# Patient Record
Sex: Male | Born: 1935
Health system: Southern US, Community
[De-identification: ages and names within clinical notes are randomized; demographics above are authoritative.]

## PROBLEM LIST (undated history)

## (undated) DIAGNOSIS — N189 Chronic kidney disease, unspecified: Secondary | ICD-10-CM

## (undated) DIAGNOSIS — K219 Gastro-esophageal reflux disease without esophagitis: Secondary | ICD-10-CM

## (undated) DIAGNOSIS — R911 Solitary pulmonary nodule: Secondary | ICD-10-CM

## (undated) DIAGNOSIS — J449 Chronic obstructive pulmonary disease, unspecified: Secondary | ICD-10-CM

## (undated) DIAGNOSIS — C419 Malignant neoplasm of bone and articular cartilage, unspecified: Secondary | ICD-10-CM

## (undated) HISTORY — PX: BACK SURGERY: SHX140

## (undated) HISTORY — DX: Chronic kidney disease, unspecified: N18.9

---

## 2002-04-14 ENCOUNTER — Encounter: Payer: Self-pay | Admitting: Family Medicine

## 2002-04-14 ENCOUNTER — Ambulatory Visit (HOSPITAL_COMMUNITY): Admission: RE | Admit: 2002-04-14 | Discharge: 2002-04-14 | Payer: Self-pay | Admitting: Family Medicine

## 2002-04-17 ENCOUNTER — Ambulatory Visit (HOSPITAL_COMMUNITY): Admission: RE | Admit: 2002-04-17 | Discharge: 2002-04-17 | Payer: Self-pay | Admitting: Family Medicine

## 2002-04-17 ENCOUNTER — Encounter: Payer: Self-pay | Admitting: Family Medicine

## 2002-10-30 ENCOUNTER — Emergency Department (HOSPITAL_COMMUNITY): Admission: EM | Admit: 2002-10-30 | Discharge: 2002-10-30 | Payer: Self-pay | Admitting: *Deleted

## 2002-10-30 ENCOUNTER — Encounter: Payer: Self-pay | Admitting: *Deleted

## 2002-11-07 ENCOUNTER — Ambulatory Visit (HOSPITAL_COMMUNITY): Admission: RE | Admit: 2002-11-07 | Discharge: 2002-11-07 | Payer: Self-pay | Admitting: Family Medicine

## 2002-11-07 ENCOUNTER — Encounter: Payer: Self-pay | Admitting: Family Medicine

## 2002-12-14 ENCOUNTER — Ambulatory Visit (HOSPITAL_COMMUNITY): Admission: RE | Admit: 2002-12-14 | Discharge: 2002-12-14 | Payer: Self-pay | Admitting: Internal Medicine

## 2002-12-19 ENCOUNTER — Ambulatory Visit (HOSPITAL_COMMUNITY): Admission: RE | Admit: 2002-12-19 | Discharge: 2002-12-19 | Payer: Self-pay | Admitting: Family Medicine

## 2002-12-19 ENCOUNTER — Encounter: Payer: Self-pay | Admitting: Family Medicine

## 2002-12-27 ENCOUNTER — Ambulatory Visit (HOSPITAL_COMMUNITY): Admission: RE | Admit: 2002-12-27 | Discharge: 2002-12-27 | Payer: Self-pay | Admitting: Family Medicine

## 2002-12-27 ENCOUNTER — Encounter: Payer: Self-pay | Admitting: Family Medicine

## 2003-02-28 ENCOUNTER — Ambulatory Visit (HOSPITAL_COMMUNITY): Admission: RE | Admit: 2003-02-28 | Discharge: 2003-02-28 | Payer: Self-pay | Admitting: Family Medicine

## 2003-02-28 ENCOUNTER — Encounter: Payer: Self-pay | Admitting: Family Medicine

## 2003-04-03 ENCOUNTER — Ambulatory Visit (HOSPITAL_COMMUNITY): Admission: RE | Admit: 2003-04-03 | Discharge: 2003-04-03 | Payer: Self-pay | Admitting: Pulmonary Disease

## 2003-04-06 ENCOUNTER — Ambulatory Visit (HOSPITAL_COMMUNITY): Admission: RE | Admit: 2003-04-06 | Discharge: 2003-04-06 | Payer: Self-pay | Admitting: Pulmonary Disease

## 2004-10-31 ENCOUNTER — Ambulatory Visit (HOSPITAL_COMMUNITY): Admission: RE | Admit: 2004-10-31 | Discharge: 2004-10-31 | Payer: Self-pay | Admitting: Family Medicine

## 2004-12-02 ENCOUNTER — Ambulatory Visit: Payer: Self-pay | Admitting: Internal Medicine

## 2004-12-12 ENCOUNTER — Ambulatory Visit: Payer: Self-pay | Admitting: Internal Medicine

## 2005-03-03 ENCOUNTER — Inpatient Hospital Stay (HOSPITAL_COMMUNITY): Admission: EM | Admit: 2005-03-03 | Discharge: 2005-03-08 | Payer: Self-pay | Admitting: Emergency Medicine

## 2005-03-03 ENCOUNTER — Ambulatory Visit: Payer: Self-pay | Admitting: Internal Medicine

## 2005-05-27 ENCOUNTER — Ambulatory Visit (HOSPITAL_COMMUNITY): Admission: RE | Admit: 2005-05-27 | Discharge: 2005-05-27 | Payer: Self-pay | Admitting: Family Medicine

## 2005-06-29 ENCOUNTER — Ambulatory Visit (HOSPITAL_COMMUNITY): Admission: RE | Admit: 2005-06-29 | Discharge: 2005-06-29 | Payer: Self-pay | Admitting: Family Medicine

## 2005-07-05 ENCOUNTER — Emergency Department (HOSPITAL_COMMUNITY): Admission: EM | Admit: 2005-07-05 | Discharge: 2005-07-05 | Payer: Self-pay | Admitting: Emergency Medicine

## 2005-08-03 ENCOUNTER — Encounter: Admission: RE | Admit: 2005-08-03 | Discharge: 2005-08-11 | Payer: Self-pay | Admitting: Orthopaedic Surgery

## 2005-09-21 ENCOUNTER — Emergency Department (HOSPITAL_COMMUNITY): Admission: EM | Admit: 2005-09-21 | Discharge: 2005-09-21 | Payer: Self-pay | Admitting: Emergency Medicine

## 2005-11-18 ENCOUNTER — Observation Stay (HOSPITAL_COMMUNITY): Admission: EM | Admit: 2005-11-18 | Discharge: 2005-11-20 | Payer: Self-pay | Admitting: Emergency Medicine

## 2006-02-16 ENCOUNTER — Ambulatory Visit (HOSPITAL_COMMUNITY): Admission: RE | Admit: 2006-02-16 | Discharge: 2006-02-16 | Payer: Self-pay | Admitting: Family Medicine

## 2006-09-17 ENCOUNTER — Emergency Department (HOSPITAL_COMMUNITY): Admission: EM | Admit: 2006-09-17 | Discharge: 2006-09-17 | Payer: Self-pay | Admitting: Emergency Medicine

## 2006-10-19 ENCOUNTER — Ambulatory Visit (HOSPITAL_COMMUNITY): Admission: RE | Admit: 2006-10-19 | Discharge: 2006-10-19 | Payer: Self-pay | Admitting: Family Medicine

## 2007-12-03 ENCOUNTER — Emergency Department (HOSPITAL_COMMUNITY): Admission: EM | Admit: 2007-12-03 | Discharge: 2007-12-03 | Payer: Self-pay | Admitting: *Deleted

## 2008-02-21 ENCOUNTER — Ambulatory Visit (HOSPITAL_COMMUNITY): Admission: RE | Admit: 2008-02-21 | Discharge: 2008-02-21 | Payer: Self-pay | Admitting: General Surgery

## 2008-05-31 ENCOUNTER — Emergency Department (HOSPITAL_COMMUNITY): Admission: EM | Admit: 2008-05-31 | Discharge: 2008-05-31 | Payer: Self-pay | Admitting: Emergency Medicine

## 2008-09-10 ENCOUNTER — Emergency Department (HOSPITAL_COMMUNITY): Admission: EM | Admit: 2008-09-10 | Discharge: 2008-09-10 | Payer: Self-pay | Admitting: Emergency Medicine

## 2008-09-19 ENCOUNTER — Ambulatory Visit (HOSPITAL_COMMUNITY): Admission: RE | Admit: 2008-09-19 | Discharge: 2008-09-19 | Payer: Self-pay | Admitting: Internal Medicine

## 2008-10-16 ENCOUNTER — Encounter: Admission: RE | Admit: 2008-10-16 | Discharge: 2008-10-16 | Payer: Self-pay | Admitting: Neurosurgery

## 2010-02-03 ENCOUNTER — Ambulatory Visit (HOSPITAL_COMMUNITY)
Admission: RE | Admit: 2010-02-03 | Discharge: 2010-02-03 | Payer: Self-pay | Source: Home / Self Care | Admitting: Urology

## 2010-06-09 ENCOUNTER — Encounter: Payer: Self-pay | Admitting: Neurosurgery

## 2010-06-12 ENCOUNTER — Ambulatory Visit (HOSPITAL_COMMUNITY)
Admission: RE | Admit: 2010-06-12 | Discharge: 2010-06-12 | Payer: Self-pay | Source: Home / Self Care | Attending: Urology | Admitting: Urology

## 2010-09-01 LAB — URINALYSIS, ROUTINE W REFLEX MICROSCOPIC
Bilirubin Urine: NEGATIVE
Glucose, UA: NEGATIVE mg/dL
Ketones, ur: NEGATIVE mg/dL
Nitrite: POSITIVE — AB
Specific Gravity, Urine: 1.02 (ref 1.005–1.030)
Urobilinogen, UA: 0.2 mg/dL (ref 0.0–1.0)
pH: 5 (ref 5.0–8.0)

## 2010-09-01 LAB — URINE MICROSCOPIC-ADD ON

## 2010-09-01 LAB — URINE CULTURE: Colony Count: >=100000

## 2010-09-30 NOTE — H&P (Signed)
[name], [name]              ACCOUNT NO.:  [id number]   MEDICAL RECORD NO.:  [id number]          PATIENT TYPE:  AMB   LOCATION:  DAY                           FACILITY:  [facility]   PHYSICIAN:  Jamesetta So, M.D.  DATE OF BIRTH:  1929-12-20   DATE OF ADMISSION:  DATE OF DISCHARGE:  LH                              HISTORY & PHYSICAL   CHIEF COMPLAINT:  History of colon polyps.   HISTORY OF PRESENT ILLNESS:  The patient is a 77 year old white male who  is referred for endoscopic evaluation.  He needs a colonoscopy due to a  history of colon polyps.  He had last had a colonoscopy many years ago  by Dr. Gala Romney.  No abdominal pain, weight loss, nausea, vomiting,  diarrhea, constipation, melena, or hematochezia have been noted.  There  is no family history of colon carcinoma.   PAST MEDICAL HISTORY:  Unremarkable.   PAST SURGICAL HISTORY:  Unremarkable.   CURRENT MEDICATIONS:  None.   ALLERGIES:  No known drug allergies.   REVIEW OF SYSTEMS:  Noncontributory.   PHYSICAL EXAMINATION:  GENERAL:  The patient is a well-developed, well-  nourished white male in no acute distress.  LUNGS:  Clear to auscultation with equal breath sounds bilaterally.  HEART:  Regular rate and rhythm without S3, S4, or murmurs.  ABDOMEN:  Soft, nontender, and nondistended.  No hepatosplenomegaly or  masses are noted.  RECTAL:  Deferred to the procedure.   IMPRESSION:  History of colon polyps.   PLAN:  The patient is scheduled for a colonoscopy on February 21, 2008.  The risks and benefits of the procedure including bleeding and  perforation were fully explained to the patient, gave informed consent.      Jamesetta So, M.D.     MAJ/MEDQ  D:  01/24/2008  T:  01/25/2008  Job:  [id number]   cc:   Halford Chessman, M.D.  Fax: [phone]

## 2010-09-30 NOTE — Op Note (Signed)
NAMEROBSON, HENSCHEN              ACCOUNT NO.:  [id number]   MEDICAL RECORD NO.:  [id number]          PATIENT TYPE:  AMB   LOCATION:  DAY                           FACILITY:  [facility]   PHYSICIAN:  Jamesetta So, M.D.  DATE OF BIRTH:  02/17/1930   DATE OF PROCEDURE:  02/21/2008  DATE OF DISCHARGE:                               OPERATIVE REPORT   PREOPERATIVE DIAGNOSIS:  History of colon polyps.   POSTOPERATIVE DIAGNOSES:  1. History of colon polyps.  2. Sigmoid diverticulosis.  3. Hemorrhoidal disease.   PROCEDURE:  Colonoscopy.   SURGEON:  Jamesetta So, MD   ANESTHESIA:  1. Demerol 50 mg IV.  2. Versed 4 mg IV.   INDICATIONS:  The patient is a 77 year old white male who presents for  followup colonoscopy.  He has had a history of colon polyps in the past.  The risks and benefits of the procedure including bleeding, infection,  and perforation were fully explained to the patient and gave informed  consent.   PROCEDURE NOTE:  The patient was placed in the left lateral decubitus  position after placement of monitoring equipment.  Demerol and Versed  were used throughout the procedure for anesthesia.  Rectal examination  was performed, which revealed circumferential hemorrhoidal disease.  The  endoscope was advanced to the cecum without difficulty.  Confirmation of  placement to the cecum was done using transabdominal palpation and  landmarks.  The bowel preparation was adequate.  The cecum, ascending  colon, descending colon, and transverse colon regions were within normal  limits.  No colon polyps were seen.  Sigmoid diverticulosis was found.  The rest of the rectum was within normal limits.  No abnormal lesions  were noted.  At the dentate line, multiple internal and external  hemorrhoids were present.  All air was then evacuated from the colon and  rectum prior to removal of the endoscope.   The patient tolerated the procedure well and was transferred back to day  surgery in stable condition.   COMPLICATIONS:  None.   SPECIMEN:  None.   RECOMMENDATIONS:  The patient will be given literature on  diverticulosis.  A followup colonoscopy is suggested in 10 years.      Jamesetta So, M.D.  Electronically Signed     MAJ/MEDQ  D:  02/21/2008  T:  02/21/2008  Job:  [id number]   cc:   Halford Chessman, M.D.  Fax: [phone]

## 2010-09-30 NOTE — H&P (Signed)
[name], [name]              ACCOUNT NO.:  [id number]   MEDICAL RECORD NO.:  [id number]          PATIENT TYPE:  AMB   LOCATION:  DAY                           FACILITY:  [facility]   PHYSICIAN:  [name], M.D.  DATE OF BIRTH:  Feb 28, 1930   DATE OF ADMISSION:  DATE OF DISCHARGE:  LH                              HISTORY & PHYSICAL   CHIEF COMPLAINT:  History of colon polyps.   HISTORY OF PRESENT ILLNESS:  The patient is a 77 year old white male who  is referred for endoscopic evaluation.  He needs a colonoscopy due to a  history of colon polyps.  He had last had a colonoscopy many years ago  by Dr. Gala Romney.  No abdominal pain, weight loss, nausea, vomiting,  diarrhea, constipation, melena, or hematochezia have been noted.  There  is no family history of colon carcinoma.   PAST MEDICAL HISTORY:  Unremarkable.   PAST SURGICAL HISTORY:  Unremarkable.   CURRENT MEDICATIONS:  None.   ALLERGIES:  No known drug allergies.   REVIEW OF SYSTEMS:  Noncontributory.   PHYSICAL EXAMINATION:  GENERAL:  The patient is a well-developed, well-  nourished white male in no acute distress.  LUNGS:  Clear to auscultation with equal breath sounds bilaterally.  HEART:  Regular rate and rhythm without S3, S4, or murmurs.  ABDOMEN:  Soft, nontender, and nondistended.  No hepatosplenomegaly or  masses are noted.  RECTAL:  Deferred to the procedure.   IMPRESSION:  History of colon polyps.   PLAN:  The patient is scheduled for a colonoscopy on February 21, 2008.  The risks and benefits of the procedure including bleeding and  perforation were fully explained to the patient, gave informed consent.      [name], M.D.  Electronically Signed     MAJ/MEDQ  D:  01/24/2008  T:  01/25/2008  Job:  [id number]   cc:   Halford Chessman, M.D.  Fax: [phone]

## 2010-10-03 NOTE — Procedures (Signed)
NAME:  [name], [name]                        ACCOUNT NO.:  [id number]   MEDICAL RECORD NO.:  [id number]                   PATIENT TYPE:  OUT   LOCATION:  RESP                                 FACILITY:  [facility]   PHYSICIAN:  Edward L. Luan Pulling, M.D.             DATE OF BIRTH:  January 12, 1930   DATE OF PROCEDURE:  DATE OF DISCHARGE:  04/03/2003                              PULMONARY FUNCTION TEST   IMPRESSION:  Spirometry shows a mild to moderate ventilatory defect with  evidence of airflow obstruction.      ___________________________________________                                            Jasper Loser Luan Pulling, M.D.   ELH/MEDQ  D:  04/09/2003  T:  04/10/2003  Job:  [id number]

## 2010-10-03 NOTE — H&P (Signed)
[name], [name]              ACCOUNT NO.:  [id number]   MEDICAL RECORD NO.:  [id number]          PATIENT TYPE:  EMS   LOCATION:  ED                            FACILITY:  [facility]   PHYSICIAN:  Bonne Dolores, M.D.    DATE OF BIRTH:  22-Mar-1930   DATE OF ADMISSION:  03/03/2005  DATE OF DISCHARGE:  LH                                HISTORY & PHYSICAL   CHIEF COMPLAINT:  Fever and leg pain.   HISTORY OF PRESENT ILLNESS:  This is a 77 year old male with completely  negative history except for recurring prostatitis and kidney infections.  He was last treated as an outpatient approximately three months ago by Dr.  Hilma Favors for the same.  He was hospitalized in the past when he became very  sick.  This was in [place].  Otherwise, the patient is devoid of  significant past surgical or medical history.   The patient developed fever, chills, and leg pain as well as dysuria late in  the evening of the 16th.  He presented to the emergency department for  evaluation.  In the emergency department he was febrile with a temperature  of 101.8.  Hemodynamics otherwise stable.  O2 saturation 94%.  A work-up in  the ER revealed a white count of 20,000 with H&H of 20 and hematocrit 37.5,  platelet count 22,000.  He also has hyponatremia with a sodium at 123 and  hypokalemia with a potassium of 3.  Met-7 otherwise unremarkable.  His urine  was significant for 21-50 wbc's, urine rbc's 3-6.   The patient is admitted with apparent urosepsis.   PAST MEDICAL HISTORY:  As noted.   ALLERGIES:  None known.   MEDICATIONS:  None.   FAMILY HISTORY:  Noncontributory.   REVIEW OF SYSTEMS:  Negative.   He denies nausea, vomiting, chest pain, shortness of breath, cough, sputum  production.  He also denies any significant prostatism until this acute  event.   SOCIAL HISTORY:  The patient smokes cigars.  Denies use of alcohol or  illicit drugs.   PHYSICAL EXAMINATION:  GENERAL:  Very pleasant, somewhat  frail-appearing  male.  He is alert and oriented, in no acute distress.  VITAL SIGNS:  As noted.  Blood pressure responded well to fluids with  114/60, heart rate of 94.  HEENT:  Normocephalic, atraumatic.  Pupils are equal.  Ears, nose, throat  are benign.  NECK:  Supple.  There are no bruits noted.  LUNGS:  Clear.  Breath sounds are somewhat distant.  ABDOMEN:  Nontender, nondistended.  RECTAL:  Heme-negative stool.  The prostate is approximately 3+ and  diffusely tender.  There are no focal nodules noted.  EXTREMITIES:  No clubbing, cyanosis, edema.  NEUROLOGIC:  Unremarkable.   LABORATORIES:  As noted above.   ASSESSMENT:  Probable urosepsis secondary to chronic/recurrent prostatitis.  He also has significant electrolyte abnormalities with hyponatremia and  hypokalemia.   PLAN:  Admit for IV antibiotics, urology consult.  Will obtain chest x-ray  and urine serum osmolality.  Consider the possibility of occult lung CA and  SIADH or other  etiology for significant hyponatremia.  Will follow and treat  expectantly.      Bonne Dolores, M.D.  Electronically Signed     MC/MEDQ  D:  03/03/2005  T:  03/03/2005  Job:  [id number]

## 2010-10-03 NOTE — Op Note (Signed)
NAME:  [name], [name]                        ACCOUNT NO.:  [id number]   MEDICAL RECORD NO.:  [id number]                   PATIENT TYPE:  AMB   LOCATION:  DAY                                  FACILITY:  [facility]   PHYSICIAN:  R. Garfield Cornea, M.D.              DATE OF BIRTH:  1930/04/09   DATE OF PROCEDURE:  12/14/2002  DATE OF DISCHARGE:                                 OPERATIVE REPORT   PROCEDURE:  Surveillance colonoscopy.   INDICATIONS FOR PROCEDURE:  The patient is a 77 year old gentleman who tells  me he had a colonoscopy every year for colonic polyps when he lived in  [place].  His last one was less than two years ago.  He is here to have  another one.  He just moved to [facility].  There is no family  history of colorectal neoplasia.  He tells me he has never had cancer in a  polyp and tells me that in the last colonoscopy in [place] there were no  polyps.  He is referred for colonoscopy.  Colonoscopy is now being done as a  surveillance maneuver.  The potential risks, benefits, and alternatives have  been reviewed and questions answered.  It is notable that there are no  records available from [place].  Please see my handwritten H&P.   PROCEDURE:  O2 saturation, blood pressure, pulses, and respirations were  monitored throughout the entire procedure.  Conscious sedation was with  Versed 3 mg IV, Demerol 50 mg IV in divided doses.  The instrument used was  the Olympus video chip adult colonoscope.   FINDINGS:  Digital rectal examination revealed no abnormalities.   ENDOSCOPIC FINDINGS:  The prep was good.   Rectum:  Examination of the rectal mucosa including retroflex view of the  anal verge revealed no abnormalities.   Colon:  The colonic mucosa was surveyed from the rectosigmoid junction  through the left, transverse, right colon to the area of the appendiceal  orifice, ileocecal valve, and cecum.  These structures were well-seen and  photographed for  the record.  The patient was noted to have left-sided  diverticula.  The remainder of the colonic mucosa all the way to the cecum  appeared normal.  From the level of the cecum and ileocecal valve, the scope  was slowly withdrawn.  All previously mentioned mucosal surfaces were again  seen, and again, no other abnormalities were observed.  The patient  tolerated the procedure well and was reactive in endoscopy.   IMPRESSION:  1. Normal rectum.  2. Sigmoid diverticula.  The remainder of the colonic mucosa appeared     normal.    RECOMMENDATIONS:  1. Diverticulosis literature provided to [name].  2. I see no reason to bring him back any sooner than five years for followup     colonoscopy.  Bridgette Habermann, M.D.    RMR/MEDQ  D:  12/14/2002  T:  12/14/2002  Job:  [id number]   cc:   Halford Chessman, M.D.  [address]  [place]  [place] [address]  Fax: [phone]

## 2010-10-03 NOTE — Discharge Summary (Signed)
[name], [name]              ACCOUNT NO.:  [id number]   MEDICAL RECORD NO.:  [id number]          PATIENT TYPE:  INP   LOCATION:  A219                          FACILITY:  [facility]   PHYSICIAN:  Bonne Dolores, M.D.    DATE OF BIRTH:  Jun 10, 1929   DATE OF ADMISSION:  03/03/2005  DATE OF DISCHARGE:  10/22/2006LH                                 DISCHARGE SUMMARY   DISCHARGE DIAGNOSES:  1.  Probable urosepsis secondary to severe prostatitis, blood cultures      negative.  Urine positive for Escherichia coli.  Good response to      therapy, although somewhat delayed.  2.  Anemia which appears to be nongastrointestinal source, possibly related      to chronic infection, other etiologies to be considered.  Anemia profile      nonspecific.  3.  Hypoadrenalism, hypothyroidism, etc., ruled out.   HISTORY OF PRESENT ILLNESS:  For details regarding admission, please refer  to the admitting note.  This 77 year old male with history of recurrent  prostatitis with kidney infections who presented to the hospital on the  day of admission with fevers, chills, diffuse pain as well as dysuria.  He  was found to be febrile with a temperature of 102, hemodynamically stable.  O2 saturations were 94%.  He had a white count of 20,000 with an H&H of 12  and 37.5, platelet count 22,000.  He also had hyponatremia and mild  hypokalemia.  He did have infected urine on urinalysis.  He was admitted  with apparent urosepsis.   HOSPITAL COURSE:  The patient was treated with Cipro empirically.  Dr.  Michela Pitcher saw the patient in consultation who followed him along.  An  ultrasound was obtained as he developed persistent nausea.  This was benign.  His liver functions also remained stable.  GI was consulted due to a mild  anemia (hemoglobin approximately 10 to 10.5).  There was no evidence of GI  bleeding and he was current with colonoscopy as outpatient as planned.   The patient has had persistent nausea which resolved  approximately 48 hours  ago.  Currently, the patient is doing very well.  He is ambulating and  eating vigorously.  He is afebrile x76 hours.  He is strongly requesting  discharge which I feel is most appropriate at this time.   DISCHARGE MEDICATIONS:  Levaquin 250 mg daily x20 days.  Close urologic  followup and possible candidate for long-term antibiotic suppressive  therapy.  Will possibly require further GI workup or consider hematology  referral for anemia.  Will follow and treat expectantly.      Bonne Dolores, M.D.  Electronically Signed     MC/MEDQ  D:  03/08/2005  T:  03/09/2005  Job:  [id number]   cc:   Halford Chessman, M.D.  Fax: [phone]

## 2010-10-03 NOTE — H&P (Signed)
[name], [name]              ACCOUNT NO.:  [id number]   MEDICAL RECORD NO.:  [id number]          PATIENT TYPE:  INP   LOCATION:  A202                          FACILITY:  [facility]   PHYSICIAN:  Bonne Dolores, M.D.    DATE OF BIRTH:  1930-04-08   DATE OF ADMISSION:  11/18/2005  DATE OF DISCHARGE:  LH                                HISTORY & PHYSICAL   CHIEF COMPLAINT:  Near syncope.   HISTORY OF PRESENT ILLNESS:  This is a very pleasant 77 year old male with a  history of recurrent prostatitis with no recent episodes.  He also has a  history of mild anemia, currently hemoglobin normal.   The patient has been seen by Dr. Claiborne Billings as an outpatient in the recent past.  He was told he had any fast heartbeat though he was completely  asymptomatic.  He was begun on Lopressor 25 mg daily and this was increased  to 25 b.i.d. several days ago.  The patient became increasingly fatigued and  orthostatic, and had a near syncopal episode.  He presented to the emergency  department for evaluation when he was found to have moderate bradycardia  with a heart rate of approximately 50.  Other parameters stable.   There is no history of chest pain, true syncope, palpitations, diaphoresis,  dyspnea, headache, neurologic deficits, abdominal pain, nausea, vomiting,  diarrhea, melena, hematemesis, hematochezia or genitourinary symptoms.   The patient is admitted with a near syncopal episode, probably related to  beta blocker sensitivity.  He does not appear to have an acute coronary  syndrome or significant arrhythmia other than sinus bradycardia secondary to  beta blocker.   CURRENT MEDICATIONS:  Metoprolol 50 mg daily only (Toprol).   ALLERGIES:  NONE KNOWN.   PAST HISTORY:  As noted.   FAMILY HISTORY:  Noncontributory.   REVIEW OF SYSTEMS:  Negative except as mentioned.   SOCIAL HISTORY:  Nonsmoker and nondrinker.  Very supportive family.   PHYSICAL EXAMINATION:  GENERAL:  A very pleasant,  thin male who is alert and  oriented in no distress.  VITAL SIGNS:  At presentation, temp 97.5, BP 87/57, pulse 50 and regular,  and O2 sat 96%.  HEENT:  Normocephalic, atraumatic.  Pupils are equal.  There is no  __________.  NECK:  Supple.  No bruits, thyromegaly or lymphadenopathy noted.  LUNGS:  Clear.  HEART SOUNDS:  Normal.  Heart rate currently 66 and no ectopics noted.  ABDOMEN:  Nontender and nondistended.  Bowel sounds are intact.  There are  no masses or bruits noted.  EXTREMITIES:  No clubbing, cyanosis or edema.  NEUROLOGIC EXAM:  Totally within normal limits.   LABORATORY REVIEW:  Benign.  EKG in sinus bradycardia, nonspecific  ventricular conduction delay, action axis normal, P-pulmonale, and no ST-T  changes suggestive of ischemia.   ASSESSMENT:  Beta blocker sensitivity.  Baseline tachyarrhythmia undefined  at this point.   PLAN:  Hold Lopressor.  Consult cardiology.  Repeat enzymes and EKG.  Monitor and treat expectantly.      Bonne Dolores, M.D.  Electronically Signed  MC/MEDQ  D:  11/19/2005  T:  11/19/2005  Job:  [id number]

## 2010-10-03 NOTE — Consult Note (Signed)
NAMEGEARY, PUZIO              ACCOUNT NO.:  [id number]   MEDICAL RECORD NO.:  [id number]          PATIENT TYPE:  INP   LOCATION:  A219                          FACILITY:  [facility]   PHYSICIAN:  R. Garfield Cornea, M.D. DATE OF BIRTH:  01/19/1930   DATE OF CONSULTATION:  03/05/2005  DATE OF DISCHARGE:                                   CONSULTATION   REASON FOR CONSULTATION:  Anemia, nausea and vomiting.   PHYSICIAN REQUESTING CONSULTATION:  Bonne Dolores, M.D.   HISTORY OF PRESENT ILLNESS:  The patient is a 77 year old Caucasian  gentleman with a history of recurrent prostatitis with kidney infections who  presented to the hospital on March 03, 2005 with complaints of fever,  chills, leg pain, and dysuria.  He was found to have a temperature of 101.8  in the emergency department.  His white count was 20,000.  Urine was  significant for 21-50 WBCs per high power field.  He was admitted with  urosepsis.  On rectal exam, he was found to have heme negative stool with  tender 3+ prostate.  He was also hypotensive in the ED with blood pressures  in the 70s/40s.  Initially, his hemoglobin was 13, hematocrit 37.5.  Today,  his hemoglobin is down to 10.2, hematocrit 29.8.  We have been consulted  regarding anemia.  In addition, the patient has been having nausea and  vomiting since admission.  He denies any abdominal pain.  No heartburn.  Bowel movements are regular.  No melena or rectal bleeding.  He was seen in  the past for a colonoscopy, given history of colonic polyps.  Colonoscopy  done July of 2004.  He had no polyps at that time, but had sigmoid  diverticula.  LFT's are normal, except albumin of 2.2.  Blood cultures have  been negative.  Urine culture positive for E. coli.  Currently on Levaquin.  Possible allergic reaction to CIPRO since admission.   MEDICATIONS PRIOR TO ADMISSION:  None.   ALLERGIES:  No known drug allergies.   PAST MEDICAL HISTORY:  Recurrent prostatitis with  kidney infections, as  outlined above.   PAST SURGICAL HISTORY:  No prior surgeries.   FAMILY HISTORY:  Negative for colorectal cancer, chronic GI illnesses, or  IBD.   SOCIAL HISTORY:  Married.  Between him and his wife, they have 8 children.  He smokes cigars, but no cigarettes.  No history of alcohol abuse.  He is  retired.   REVIEW OF SYSTEMS:  As outlined above for GI and GU.  CARDIOPULMONARY:  No  palpations, chest pain, or shortness of breath.  CONSTITUTIONAL:  No weight  loss.   PHYSICAL EXAMINATION:  VITAL SIGNS:  T-max 100.1.  T current 99.7.  Pulse  62, respirations 20, blood pressure 105/60, weight 131.3, height 76 inches.  GENERAL:  A pleasant, thin, elderly Caucasian male in no acute distress.  SKIN:  Warm and dry, no jaundice.  HEENT:  Conjunctivae are slightly pale.  Sclerae are nonicteric.  Oropharyngeal mucosa moist and pink.  No lymphadenopathy.  CHEST:  Reveal expiratory wheezes throughout.  CARDIAC:  Regular rate and rhythm.  No murmurs, rubs, or gallops.  ABDOMEN:  Positive bowel sounds, flat, nondistended, nontender.  No  organomegaly or masses.  No rebound tenderness, no guarding.  RECTAL:  No masses in the rectal vault.  Secretions were heme negative.  Prostate moderately enlarged and slightly tender to palpation.  LOWER EXTREMITIES:  No edema.   LABORATORY DATA:  As mentioned in the HPI.  In addition, platelets 125,000.  Sodium 136, potassium 4.1, BUN 12, creatinine 1.2, glucose 103.  Total  bilirubin 0.7, alkaline phosphatase 56.  AST 14, ALT 10, albumin 2.2,  amylase 28, lipase 16.   IMPRESSION:  The patient is a 77 year old Caucasian gentleman admitted with  urosepsis, as well as nausea and vomiting.  His hemoglobin has dropped since  admission, but no signs of overt or occult gastrointestinal bleeding.  Suspect nausea and vomiting related to his urosepsis rather than a biliary  etiology.  Abdominal ultrasound results are pending.  Normocytic anemia  of  unknown etiology at this point.  Work-up in progress.   RECOMMENDATIONS:  1.  Follow up abdominal ultrasound results.  2.  Follow up anemia profile.  3.  CBC in the morning.  4.  Will add Protonix 40 mg orally.   I would like to thank Dr. Bonne Dolores for allowing [place] to take part in the  care of this patient.      Neil Crouch, P.ABridgette Habermann, M.D.  Electronically Signed    LL/MEDQ  D:  03/05/2005  T:  03/05/2005  Job:  [id number]

## 2010-10-03 NOTE — Procedures (Signed)
NAMEJONIEL, KOKOSZKA              ACCOUNT NO.:  [id number]   MEDICAL RECORD NO.:  [id number]          PATIENT TYPE:  INP   LOCATION:  A202                          FACILITY:  [facility]   PHYSICIAN:  Bryson Dames, M.D.DATE OF BIRTH:  January 28, 1930   DATE OF PROCEDURE:  11/20/2005  DATE OF DISCHARGE:                                  ECHOCARDIOGRAM   INDICATION FOR PROCEDURE:  Syncope and bradycardia.   RESULTS:  1.  Aortic valve good opening, no evidence of stenosis, no regurgitation      seen, unable to state whether this valve is a trileaflet valve or not      because of image quality.  2.  Mitral valve normal, no regurgitation, tricuspid valve poorly seen.  3.  Pulmonic valve not seen.  4.  Aorta.  Normal aortic root dimension 3.9.  5.  Left atrium.  Left atrial dimension is at the upper limits of normal for      body surface index, it measures 4.2.  Left ventricle normal      contractility, small ventricle, end-systolic dimension 2.7 and diastolic      dimension 3.8.  Septum and posterior wall thicknesses are both increased      at 1.3-1.4 respectively, there is a pattern of concentric left      ventricular hypertrophy.  No wall motion abnormalities are noted,      ejection fraction is estimated at 60%.  6.  Pericardium.  No evidence of pericardial effusion.   FINAL DIAGNOSES:  1.  No evidence of valvular pathology noted.  2.  Concentric left ventricular hypertrophy with good left ventricular      systolic function.  3.  No evidence of pericardial effusion.           ______________________________  Bryson Dames, M.D.     WHG/MEDQ  D:  11/20/2005  T:  11/20/2005  Job:  [id number]   cc:   Bonne Dolores, M.D.  Fax: [phone]   Allison Quarry, M.D.  [address]  [place], [place] [address]

## 2010-10-03 NOTE — Procedures (Signed)
   NAME:  [name], [name]                        ACCOUNT NO.:  [id number]   MEDICAL RECORD NO.:  [id number]                   PATIENT TYPE:  EMS   LOCATION:  ED                                   FACILITY:  [facility]   PHYSICIAN:  Edward L. Luan Pulling, M.D.             DATE OF BIRTH:  Mar 16, 1930   DATE OF PROCEDURE:  10/30/2002  DATE OF DISCHARGE:                                EKG INTERPRETATION   DATE OF PROCEDURE:  October 30, 2002, at 1154 hours.   RESULTS:  The rhythm is sinus rhythm with a rate of about 60.  There is  atrial enlargement, but appears to be mostly right atrial.  There is  generally low voltage.   IMPRESSION:  Abnormal electrocardiogram.                                               Edward L. Luan Pulling, M.D.    ELH/MEDQ  D:  10/30/2002  T:  10/31/2002  Job:  [id number]

## 2011-02-13 LAB — DIFFERENTIAL
Basophils Absolute: 0
Basophils Relative: 0
Eosinophils Absolute: 0
Eosinophils Relative: 0
Lymphocytes Relative: 4 — ABNORMAL LOW
Lymphs Abs: 0.6 — ABNORMAL LOW
Monocytes Absolute: 0.8
Monocytes Relative: 5
Neutro Abs: 15.2 — ABNORMAL HIGH
Neutrophils Relative %: 91 — ABNORMAL HIGH

## 2011-02-13 LAB — URINALYSIS, ROUTINE W REFLEX MICROSCOPIC
Bilirubin Urine: NEGATIVE
Glucose, UA: NEGATIVE
Ketones, ur: NEGATIVE
Nitrite: NEGATIVE
Specific Gravity, Urine: 1.025
Urobilinogen, UA: 0.2
pH: 5.5

## 2011-02-13 LAB — CBC
HCT: 35.1 — ABNORMAL LOW
Hemoglobin: 11.6 — ABNORMAL LOW
MCHC: 33
MCV: 96
Platelets: 235
RBC: 3.65 — ABNORMAL LOW
RDW: 14.7
WBC: 16.6 — ABNORMAL HIGH

## 2011-02-13 LAB — BASIC METABOLIC PANEL
BUN: 15
CO2: 23
Calcium: 8.4
Chloride: 111
Creatinine, Ser: 1.39
GFR calc Af Amer: >60
GFR calc non Af Amer: 50 — ABNORMAL LOW
Glucose, Bld: 116 — ABNORMAL HIGH
Potassium: 3.8
Sodium: 139

## 2011-02-13 LAB — URINE MICROSCOPIC-ADD ON

## 2011-02-13 LAB — URINE CULTURE: Colony Count: >=100000

## 2011-07-17 DIAGNOSIS — H2510 Age-related nuclear cataract, unspecified eye: Secondary | ICD-10-CM | POA: Diagnosis not present

## 2011-09-21 ENCOUNTER — Emergency Department (HOSPITAL_COMMUNITY)
Admission: EM | Admit: 2011-09-21 | Discharge: 2011-09-21 | Disposition: A | Discharge status: Home / Self Care | Payer: Medicare Other | Attending: Emergency Medicine | Admitting: Emergency Medicine

## 2011-09-21 ENCOUNTER — Emergency Department (HOSPITAL_COMMUNITY): Payer: Medicare Other

## 2011-09-21 ENCOUNTER — Encounter (HOSPITAL_COMMUNITY): Payer: Self-pay | Admitting: Emergency Medicine

## 2011-09-21 DIAGNOSIS — M8448XA Pathological fracture, other site, initial encounter for fracture: Secondary | ICD-10-CM | POA: Diagnosis not present

## 2011-09-21 DIAGNOSIS — X58XXXA Exposure to other specified factors, initial encounter: Secondary | ICD-10-CM | POA: Insufficient documentation

## 2011-09-21 DIAGNOSIS — M549 Dorsalgia, unspecified: Secondary | ICD-10-CM | POA: Diagnosis not present

## 2011-09-21 DIAGNOSIS — M949 Disorder of cartilage, unspecified: Secondary | ICD-10-CM | POA: Diagnosis not present

## 2011-09-21 DIAGNOSIS — S22009A Unspecified fracture of unspecified thoracic vertebra, initial encounter for closed fracture: Secondary | ICD-10-CM | POA: Insufficient documentation

## 2011-09-21 DIAGNOSIS — R079 Chest pain, unspecified: Secondary | ICD-10-CM | POA: Diagnosis not present

## 2011-09-21 DIAGNOSIS — M899 Disorder of bone, unspecified: Secondary | ICD-10-CM | POA: Diagnosis not present

## 2011-09-21 DIAGNOSIS — IMO0002 Reserved for concepts with insufficient information to code with codable children: Secondary | ICD-10-CM

## 2011-09-21 DIAGNOSIS — M546 Pain in thoracic spine: Secondary | ICD-10-CM | POA: Diagnosis not present

## 2011-09-21 DIAGNOSIS — R0789 Other chest pain: Secondary | ICD-10-CM | POA: Diagnosis not present

## 2011-09-21 DIAGNOSIS — R918 Other nonspecific abnormal finding of lung field: Secondary | ICD-10-CM | POA: Diagnosis not present

## 2011-09-21 LAB — CBC
HCT: 36.4 % — ABNORMAL LOW (ref 39.0–52.0)
Hemoglobin: 12.2 g/dL — ABNORMAL LOW (ref 13.0–17.0)
MCH: 30.7 pg (ref 26.0–34.0)
MCHC: 33.5 g/dL (ref 30.0–36.0)
MCV: 91.7 fL (ref 78.0–100.0)
Platelets: 164 10*3/uL (ref 150–400)
RBC: 3.97 MIL/uL — ABNORMAL LOW (ref 4.22–5.81)
RDW: 17.9 % — ABNORMAL HIGH (ref 11.5–15.5)
WBC: 4.4 10*3/uL (ref 4.0–10.5)

## 2011-09-21 LAB — BASIC METABOLIC PANEL
BUN: 21 mg/dL (ref 6–23)
CO2: 25 mEq/L (ref 19–32)
Calcium: 10.2 mg/dL (ref 8.4–10.5)
Chloride: 103 mEq/L (ref 96–112)
Creatinine, Ser: 1.27 mg/dL (ref 0.50–1.35)
GFR calc Af Amer: 60 mL/min — ABNORMAL LOW (ref >90)
GFR calc non Af Amer: 52 mL/min — ABNORMAL LOW (ref >90)
Glucose, Bld: 94 mg/dL (ref 70–99)
Potassium: 4 mEq/L (ref 3.5–5.1)
Sodium: 139 mEq/L (ref 135–145)

## 2011-09-21 LAB — DIFFERENTIAL
Basophils Absolute: 0 10*3/uL (ref 0.0–0.1)
Basophils Relative: 1 % (ref 0–1)
Eosinophils Absolute: 0 10*3/uL (ref 0.0–0.7)
Eosinophils Relative: 1 % (ref 0–5)
Lymphocytes Relative: 24 % (ref 12–46)
Lymphs Abs: 1 10*3/uL (ref 0.7–4.0)
Monocytes Absolute: 0.6 10*3/uL (ref 0.1–1.0)
Monocytes Relative: 13 % — ABNORMAL HIGH (ref 3–12)
Neutro Abs: 2.8 10*3/uL (ref 1.7–7.7)
Neutrophils Relative %: 63 % (ref 43–77)

## 2011-09-21 LAB — POCT I-STAT TROPONIN I: Troponin i, poc: 0.01 ng/mL (ref 0.00–0.08)

## 2011-09-21 MED ORDER — HYDROMORPHONE HCL PF 1 MG/ML IJ SOLN
1.0000 mg | Freq: Once | INTRAMUSCULAR | Status: AC
  Administered 2011-09-21: 1 mg via INTRAVENOUS
  Filled 2011-09-21: qty 1

## 2011-09-21 MED ORDER — HYDROCODONE-ACETAMINOPHEN 5-325 MG PO TABS: ORAL_TABLET | ORAL | Status: DC

## 2011-09-21 MED ORDER — ONDANSETRON HCL 4 MG/2ML IJ SOLN
4.0000 mg | Freq: Once | INTRAMUSCULAR | Status: AC
  Administered 2011-09-21: 4 mg via INTRAVENOUS
  Filled 2011-09-21: qty 2

## 2011-09-21 NOTE — ED Notes (Signed)
Pt c/o mid back pain since Friday worsening and radiating into chest last night.

## 2011-09-21 NOTE — Discharge Instructions (Signed)
Insect Bite Mosquitoes, flies, fleas, bedbugs, and many other insects can bite. Insect bites are different from insect stings. A sting is when venom is injected into the skin. Some insect bites can transmit infectious diseases. SYMPTOMS  Insect bites usually turn red, swell, and itch for 2 to 4 days. They often go away on their own. TREATMENT  Your caregiver may prescribe antibiotic medicines if a bacterial infection develops in the bite. HOME CARE INSTRUCTIONS  Do not scratch the bite area.   Keep the bite area clean and dry. Wash the bite area thoroughly with soap and water.   Put ice or cool compresses on the bite area.   Put ice in a plastic bag.   Place a towel between your skin and the bag.   Leave the ice on for 20 minutes, 4 times a day for the first 2 to 3 days, or as directed.   You may apply a baking soda paste, cortisone cream, or calamine lotion to the bite area as directed by your caregiver. This can help reduce itching and swelling.   Only take over-the-counter or prescription medicines as directed by your caregiver.   If you are given antibiotics, take them as directed. Finish them even if you start to feel better.  You may need a tetanus shot if:  You cannot remember when you had your last tetanus shot.   You have never had a tetanus shot.   The injury broke your skin.  If you get a tetanus shot, your arm may swell, get red, and feel warm to the touch. This is common and not a problem. If you need a tetanus shot and you choose not to have one, there is a rare chance of getting tetanus. Sickness from tetanus can be serious. SEEK IMMEDIATE MEDICAL CARE IF:   You have increased pain, redness, or swelling in the bite area.   You see a red line on the skin coming from the bite.   You have a fever.   You have joint pain.   You have a headache or neck pain.   You have unusual weakness.   You have a rash.   You have chest pain or shortness of breath.   You  have abdominal pain, nausea, or vomiting.   You feel unusually tired or sleepy.  MAKE SURE YOU:   Understand these instructions.   Will watch your condition.   Will get help right away if you are not doing well or get worse.  Document Released: 06/11/2004 Document Revised: 04/23/2011 Document Reviewed: 12/03/2010 [facility] Patient Information 2012 [place].   The radiologist notes an acute  fracture of T8.  You have old healed fractures of T6,7 and 9.  Apply ice several times daily.  Take the pain medicine as directed.  Follow up with your MD as needed.

## 2011-09-21 NOTE — ED Provider Notes (Signed)
See prior note   Janice Norrie, MD 09/21/11 1620

## 2011-09-21 NOTE — ED Provider Notes (Signed)
History     CSN: [id number]  Arrival date & time 09/21/11  0910   First MD Initiated Contact with Patient 09/21/11 [phone]      Chief Complaint  Patient presents with  . Back Pain  . Chest Pain    (Consider location/radiation/quality/duration/timing/severity/associated sxs/prior treatment) HPI Comments: Denies trauma.  No cough or fever.  No n/v, diaphoresis, SOB or presyncopal sxs.  Declined offer for pain meds.  Patient is a 78 y.o. male presenting with back pain and chest pain. The history is provided by the patient. No language interpreter was used.  Back Pain  This is a new problem. The current episode started 2 days ago. The problem occurs constantly. The problem has been gradually worsening. The pain is associated with no known injury. Pain location: "hurts around my entire chest. The quality of the pain is described as aching. The pain does not radiate. The pain is moderate. Exacerbated by: movement and deep inspiration. The pain is the same all the time. Associated symptoms include chest pain. Pertinent negatives include no fever, no paresis and no weakness. Treatments tried: tylenol dose yest. The treatment provided no relief.  Chest Pain Pertinent negatives for primary symptoms include no fever, no shortness of breath, no cough and no wheezing.  Pertinent negatives for associated symptoms include no weakness.     History reviewed. No pertinent past medical history.  History reviewed. No pertinent past surgical history.  No family history on file.  History  Substance Use Topics  . Smoking status: Never Smoker   . Smokeless tobacco: Not on file  . Alcohol Use: No      Review of Systems  Constitutional: Negative for fever and chills.  Respiratory: Negative for cough, shortness of breath and wheezing.   Cardiovascular: Positive for chest pain. Negative for leg swelling.  Musculoskeletal: Positive for back pain.  Neurological: Negative for weakness.  All other  systems reviewed and are negative.    Allergies  Review of patient's allergies indicates no known allergies.  Home Medications   Current Outpatient Rx  Name Route Sig Dispense Refill  . HYDROCODONE-ACETAMINOPHEN 5-325 MG PO TABS  One tab po q 4-6 hrs prn pain 20 tablet 0    BP 103/57  Pulse 58  Temp(Src) 97.7 F (36.5 C) (Oral)  Resp 13  Ht 6\' 4"  (1.93 m)  Wt 152 lb (68.947 kg)  BMI 18.50 kg/m2  SpO2 95%  Physical Exam  Nursing note and vitals reviewed. Constitutional: He is oriented to person, place, and time. He appears well-developed and well-nourished. He is cooperative.  Non-toxic appearance. He does not have a sickly appearance. He does not appear ill. No distress.  HENT:  Head: Normocephalic and atraumatic.  Eyes: EOM are normal.  Neck: Normal range of motion.  Cardiovascular: Normal rate, regular rhythm, S1 normal, S2 normal, normal heart sounds, intact distal pulses and normal pulses.   No extrasystoles are present.  Pulmonary/Chest: Effort normal and breath sounds normal. No respiratory distress. He has no decreased breath sounds.       Describes band-like discomfort around entire chest and back   Abdominal: Soft. He exhibits no distension. There is no tenderness.  Musculoskeletal: Normal range of motion.  Neurological: He is alert and oriented to person, place, and time.  Skin: Skin is warm and dry.  Psychiatric: He has a normal mood and affect. Judgment normal.    ED Course  Procedures (including critical care time)  Labs Reviewed  CBC - Abnormal; Notable  for the following:    RBC 3.97 (*)    Hemoglobin 12.2 (*)    HCT 36.4 (*)    RDW 17.9 (*)    All other components within normal limits  DIFFERENTIAL - Abnormal; Notable for the following:    Monocytes Relative 13 (*)    All other components within normal limits  BASIC METABOLIC PANEL - Abnormal; Notable for the following:    GFR calc non Af Amer 52 (*)    GFR calc Af Amer 60 (*)    All other  components within normal limits  POCT I-STAT TROPONIN I   Dg Thoracic Spine 4v  09/21/2011  *RADIOLOGY REPORT*  Clinical Data: Back pain  THORACIC SPINE - 4+ VIEW  Comparison: None.  Findings: Three views of thoracic spine submitted.  At least 2 moderate compression fractures are noted mid thoracic spine.  Acute fractures cannot be excluded.  Further evaluation with bone scan or MRI is recommended.  Diffuse osteopenia is noted.  IMPRESSION: At least 2  moderate compression fractures are noted mid thoracic spine.  Acute fractures cannot be excluded.  Further evaluation with bone scan or MRI is recommended.  Diffuse osteopenia is noted.  Original Report Authenticated By: Lahoma Crocker, M.D.   Mr Thoracic Spine Wo Contrast  09/21/2011  *RADIOLOGY REPORT*  Clinical Data: Back pain.  Thoracic fracture on x-ray  MRI THORACIC SPINE WITHOUT CONTRAST  Technique:  Multiplanar and multiecho pulse sequences of the thoracic spine were obtained without intravenous contrast.  Comparison: Thoracic radiographs 09/21/2011  Findings: Mild fracture of T8 involving the superior endplate appears acute with bone marrow edema present.  This appears to be a benign fracture.  No retropulsion into the spinal canal and no cord compression.  Chronic compression fractures of T6, T7, and T9 without mass or bone marrow edema.  Fracture of the superior plate of T1 of indeterminate age but probably chronic.  No cord compression or cord edema.  Mild thoracic disc degeneration.  Negative for disc protrusion or spinal stenosis.  IMPRESSION: Mild fracture of T8 appears acute.  Multiple chronic compression fractures in the thoracic spine.  T1 compression fracture is indeterminate but probably chronic.  Per CMS PQRS reporting requirements (PQRS Measure 24): Given the patient's age of greater than 70 and the fracture site (hip, distal radius, or spine), the patient should be tested for osteoporosis using DXA, and the appropriate treatment considered based  on the DXA results.  Original Report Authenticated By: Truett Perna, M.D.   Dg Chest Portable 1 View  09/21/2011  *RADIOLOGY REPORT*  Clinical Data: Back pain  PORTABLE CHEST - 1 VIEW  Comparison: Chest radiograph 10/19/2006, CT 09/21/2005  Findings: Normal mediastinum and heart silhouette.  Lungs are hyperinflated.  There is nodular apical thickening unchanged from prior.  No effusion, infiltrate, or pneumothorax.  IMPRESSION:  1.  No acute findings. 2.  Emphysematous change.  Original Report Authenticated By: Suzy Bouchard, M.D.     1. Vertebral fracture       [place], [place] 09/21/11 1505

## 2011-09-21 NOTE — ED Notes (Signed)
Pt VSS. Pt states he understands discharge instructions. Ambulatory without assistance.

## 2011-09-21 NOTE — ED Notes (Signed)
Patient complains of pain when he moves.

## 2011-09-21 NOTE — ED Notes (Signed)
Patient transported to MRI. NAD noted.

## 2011-09-21 NOTE — ED Provider Notes (Signed)
This chart was scribed for Janice Norrie, MD, MD by Rhae Lerner. The patient was seen in room APA17 and the patient's care was started at 11:36AM.  Pt reports that he has moderate mid back pain between shoulder blades. Denies numbness in arms and legs. Reports chest pain. Movement aggravates the pain. Pain started 3 days ago when he woke up. Denies injury. He reports having similar symptoms in the past 20 years but only lasts a few hours. Denies smoking.  PCP is Dr. Hilma Favors  Pt has no pain to palpation in his thoracic spine, noted to have intermittant jumping or twitching as if having a "catching" pain.    Date: 09/21/2011  Rate: 75  Rhythm: normal sinus rhythm  QRS Axis: normal  Intervals: normal  ST/T Wave abnormalities: normal  Conduction Disutrbances:none  Narrative Interpretation: LAE  Old EKG Reviewed: none available    Medical screening examination/treatment/procedure(s) were conducted as a shared visit with non-physician practitioner(s) and myself.  I personally evaluated the patient during the encounter Rolland Porter, MD, Alanson Aly, MD 09/21/11 [phone]

## 2011-09-22 ENCOUNTER — Other Ambulatory Visit (HOSPITAL_COMMUNITY): Payer: Self-pay | Admitting: Emergency Medicine

## 2011-09-22 DIAGNOSIS — M549 Dorsalgia, unspecified: Secondary | ICD-10-CM

## 2011-09-29 ENCOUNTER — Other Ambulatory Visit (HOSPITAL_COMMUNITY): Payer: Self-pay | Admitting: Orthopaedic Surgery

## 2011-09-29 DIAGNOSIS — M546 Pain in thoracic spine: Secondary | ICD-10-CM | POA: Diagnosis not present

## 2011-10-02 ENCOUNTER — Ambulatory Visit (HOSPITAL_COMMUNITY)
Admission: RE | Admit: 2011-10-02 | Discharge: 2011-10-02 | Disposition: A | Discharge status: Home / Self Care | Payer: Medicare Other | Source: Ambulatory Visit | Attending: Orthopaedic Surgery | Admitting: Orthopaedic Surgery

## 2011-10-02 DIAGNOSIS — Z13820 Encounter for screening for osteoporosis: Secondary | ICD-10-CM | POA: Insufficient documentation

## 2011-10-02 DIAGNOSIS — M899 Disorder of bone, unspecified: Secondary | ICD-10-CM | POA: Diagnosis not present

## 2011-10-06 DIAGNOSIS — M810 Age-related osteoporosis without current pathological fracture: Secondary | ICD-10-CM | POA: Diagnosis not present

## 2011-12-29 DIAGNOSIS — M546 Pain in thoracic spine: Secondary | ICD-10-CM | POA: Diagnosis not present

## 2011-12-29 DIAGNOSIS — S22009A Unspecified fracture of unspecified thoracic vertebra, initial encounter for closed fracture: Secondary | ICD-10-CM | POA: Diagnosis not present

## 2012-10-04 DIAGNOSIS — J449 Chronic obstructive pulmonary disease, unspecified: Secondary | ICD-10-CM | POA: Diagnosis not present

## 2012-10-04 DIAGNOSIS — Z0000 Encounter for general adult medical examination without abnormal findings: Secondary | ICD-10-CM | POA: Diagnosis not present

## 2012-10-04 DIAGNOSIS — M810 Age-related osteoporosis without current pathological fracture: Secondary | ICD-10-CM | POA: Diagnosis not present

## 2012-10-04 DIAGNOSIS — Z79899 Other long term (current) drug therapy: Secondary | ICD-10-CM | POA: Diagnosis not present

## 2012-10-04 DIAGNOSIS — Z125 Encounter for screening for malignant neoplasm of prostate: Secondary | ICD-10-CM | POA: Diagnosis not present

## 2012-10-04 DIAGNOSIS — E785 Hyperlipidemia, unspecified: Secondary | ICD-10-CM | POA: Diagnosis not present

## 2012-10-04 DIAGNOSIS — Z681 Body mass index (BMI) 19 or less, adult: Secondary | ICD-10-CM | POA: Diagnosis not present

## 2013-02-23 DIAGNOSIS — N390 Urinary tract infection, site not specified: Secondary | ICD-10-CM | POA: Diagnosis not present

## 2013-02-23 DIAGNOSIS — R319 Hematuria, unspecified: Secondary | ICD-10-CM | POA: Diagnosis not present

## 2013-02-23 DIAGNOSIS — Z681 Body mass index (BMI) 19 or less, adult: Secondary | ICD-10-CM | POA: Diagnosis not present

## 2013-08-15 ENCOUNTER — Encounter (HOSPITAL_COMMUNITY): Payer: Self-pay | Admitting: Emergency Medicine

## 2013-08-15 ENCOUNTER — Emergency Department (HOSPITAL_COMMUNITY)
Admission: EM | Admit: 2013-08-15 | Discharge: 2013-08-15 | Disposition: A | Discharge status: Home / Self Care | Payer: Medicare Other | Attending: Emergency Medicine | Admitting: Emergency Medicine

## 2013-08-15 DIAGNOSIS — H81399 Other peripheral vertigo, unspecified ear: Secondary | ICD-10-CM | POA: Diagnosis not present

## 2013-08-15 DIAGNOSIS — R42 Dizziness and giddiness: Secondary | ICD-10-CM | POA: Diagnosis not present

## 2013-08-15 LAB — BASIC METABOLIC PANEL
BUN: 21 mg/dL (ref 6–23)
CO2: 26 mEq/L (ref 19–32)
Calcium: 9.7 mg/dL (ref 8.4–10.5)
Chloride: 105 mEq/L (ref 96–112)
Creatinine, Ser: 1.22 mg/dL (ref 0.50–1.35)
GFR calc Af Amer: 62 mL/min — ABNORMAL LOW (ref >90)
GFR calc non Af Amer: 54 mL/min — ABNORMAL LOW (ref >90)
Glucose, Bld: 87 mg/dL (ref 70–99)
Potassium: 4.3 mEq/L (ref 3.7–5.3)
Sodium: 141 mEq/L (ref 137–147)

## 2013-08-15 LAB — URINALYSIS, ROUTINE W REFLEX MICROSCOPIC
Bilirubin Urine: NEGATIVE
Glucose, UA: NEGATIVE mg/dL
Hgb urine dipstick: NEGATIVE
Ketones, ur: NEGATIVE mg/dL
Leukocytes, UA: NEGATIVE
Nitrite: NEGATIVE
Protein, ur: NEGATIVE mg/dL
Specific Gravity, Urine: 1.02 (ref 1.005–1.030)
Urobilinogen, UA: 0.2 mg/dL (ref 0.0–1.0)
pH: 5.5 (ref 5.0–8.0)

## 2013-08-15 LAB — CBC WITH DIFFERENTIAL/PLATELET
Basophils Absolute: 0 10*3/uL (ref 0.0–0.1)
Basophils Relative: 1 % (ref 0–1)
Eosinophils Absolute: 0.1 10*3/uL (ref 0.0–0.7)
Eosinophils Relative: 2 % (ref 0–5)
HCT: 32.6 % — ABNORMAL LOW (ref 39.0–52.0)
Hemoglobin: 11 g/dL — ABNORMAL LOW (ref 13.0–17.0)
Lymphocytes Relative: 28 % (ref 12–46)
Lymphs Abs: 0.8 10*3/uL (ref 0.7–4.0)
MCH: 30 pg (ref 26.0–34.0)
MCHC: 33.7 g/dL (ref 30.0–36.0)
MCV: 88.8 fL (ref 78.0–100.0)
Monocytes Absolute: 0.3 10*3/uL (ref 0.1–1.0)
Monocytes Relative: 11 % (ref 3–12)
Neutro Abs: 1.8 10*3/uL (ref 1.7–7.7)
Neutrophils Relative %: 58 % (ref 43–77)
Platelets: 168 10*3/uL (ref 150–400)
RBC: 3.67 MIL/uL — ABNORMAL LOW (ref 4.22–5.81)
RDW: 18.5 % — ABNORMAL HIGH (ref 11.5–15.5)
WBC: 3 10*3/uL — ABNORMAL LOW (ref 4.0–10.5)

## 2013-08-15 LAB — CBG MONITORING, ED: Glucose-Capillary: 81 mg/dL (ref 70–99)

## 2013-08-15 MED ORDER — ONDANSETRON HCL 4 MG PO TABS: 4.0000 mg | ORAL_TABLET | Freq: Four times a day (QID) | ORAL | Status: DC

## 2013-08-15 MED ORDER — MECLIZINE HCL 32 MG PO TABS: 32.0000 mg | ORAL_TABLET | Freq: Three times a day (TID) | ORAL | Status: DC | PRN

## 2013-08-15 MED ORDER — ONDANSETRON 4 MG PO TBDP
4.0000 mg | ORAL_TABLET | Freq: Once | ORAL | Status: AC
  Administered 2013-08-15: 4 mg via ORAL
  Filled 2013-08-15: qty 1

## 2013-08-15 MED ORDER — MECLIZINE HCL 12.5 MG PO TABS
25.0000 mg | ORAL_TABLET | Freq: Once | ORAL | Status: AC
  Administered 2013-08-15: 25 mg via ORAL
  Filled 2013-08-15: qty 2

## 2013-08-15 NOTE — ED Provider Notes (Signed)
CSN: [id number]     Arrival date & time 08/15/13  1121 History  This chart was scribed for Thomas Frames, MD by Roe Coombs, ED Scribe. The patient was seen in room APA09/APA09. Patient's care was started at 12:17 PM.  Chief Complaint  Patient presents with  . Dizziness   Patient is a 80 y.o. male presenting with dizziness. The history is provided by the patient. No language interpreter was used.  Dizziness Quality:  Room spinning Severity:  Mild Duration:  2 days Timing:  Intermittent Progression:  Unchanged Chronicity:  New Context: standing up   Context: not with loss of consciousness   Ineffective treatments:  None tried Associated symptoms: no headaches   Risk factors: no hx of stroke     HPI Comments: [name] is a 80 y.o. male who presents to the Emergency Department complaining of intermittent dizziness onset yesterday morning. Patient characterizes dizziness as a room spinning sensation. He describes that dizziness mostly comes on with position changes. Patient states that when he tried to get out of bed yesterday morning, he felt dizzy, and he also experienced this while getting into bed yesterday evening. He says that he does not have trouble with dizziness once he has been standing or lying down for a few minutes. He has never experienced these symptoms before. He feels that his coordination is normal and he can ambulate without difficulty. He denies visual disturbances, headaches, speech changes, numbness or weakness in extremities. He does not take any medications on a regular basis. He has no chronic medical conditions.   History reviewed. No pertinent past medical history. History reviewed. No pertinent past surgical history. History reviewed. No pertinent family history. History  Substance Use Topics  . Smoking status: Never Smoker   . Smokeless tobacco: Not on file  . Alcohol Use: Yes    Review of Systems  Constitutional: Negative for fever.  HENT:  Negative for voice change.   Eyes: Negative for visual disturbance.  Neurological: Positive for dizziness. Negative for weakness, numbness and headaches.  All other systems reviewed and are negative.   Allergies  Review of patient's allergies indicates no known allergies.  Home Medications   Current Outpatient Rx  Name  Route  Sig  Dispense  Refill  . HYDROcodone-acetaminophen (NORCO) 5-325 MG per tablet      One tab po q 4-6 hrs prn pain   20 tablet   0    Triage Vitals: 121/65  Pulse 73  Temp(Src) 97.6 F (36.4 C) (Oral)  Resp 14  Ht 6\' 4"  (1.93 m)  Wt 145 lb (65.772 kg)  BMI 17.66 kg/m2  SpO2 96% Physical Exam  Nursing note and vitals reviewed. Constitutional: He is oriented to person, place, and time. He appears well-developed and well-nourished. No distress.  HENT:  Head: Normocephalic and atraumatic.  Right Ear: External ear normal.  Left Ear: External ear normal.  Mouth/Throat: Oropharynx is clear and moist.  Eyes: Conjunctivae are normal. Right eye exhibits no discharge. Left eye exhibits no discharge. No scleral icterus.  Neck: Neck supple. No tracheal deviation present.  Cardiovascular: Normal rate, regular rhythm and intact distal pulses.   Pulmonary/Chest: Effort normal and breath sounds normal. No stridor. No respiratory distress. He has no wheezes. He has no rales.  Abdominal: Soft. Bowel sounds are normal. He exhibits no distension. There is no tenderness. There is no rebound and no guarding.  Musculoskeletal: He exhibits no edema and no tenderness.  Neurological: He is alert  and oriented to person, place, and time. He has normal strength. No cranial nerve deficit (No facial droop, extraocular movements intact, tongue midline ) or sensory deficit. He exhibits normal muscle tone. He displays no seizure activity. Coordination normal.  No pronator drift bilateral upper extrem, able to hold both legs off bed for 5 seconds, sensation intact in all extremities, no  visual field cuts, no left or right sided neglect, normal finger-nose exam bilaterally, few beats of lateral nystagmus  Skin: Skin is warm and dry. No rash noted.  Psychiatric: He has a normal mood and affect.    ED Course  Procedures (including critical care time) DIAGNOSTIC STUDIES: Oxygen Saturation is 96% on room air, normal by my interpretation.    COORDINATION OF CARE: 12:24 PM- Patient informed of current plan for treatment and evaluation and agrees with plan at this time.   Labs Review Labs Reviewed  CBC WITH DIFFERENTIAL - Abnormal; Notable for the following:    WBC 3.0 (*)    RBC 3.67 (*)    Hemoglobin 11.0 (*)    HCT 32.6 (*)    RDW 18.5 (*)    All other components within normal limits  BASIC METABOLIC PANEL - Abnormal; Notable for the following:    GFR calc non Af Amer 54 (*)    GFR calc Af Amer 62 (*)    All other components within normal limits  URINALYSIS, ROUTINE W REFLEX MICROSCOPIC  CBG MONITORING, ED   Imaging Review No results found.   EKG Interpretation   Date/Time:  Tuesday August 15 2013 11:47:13 EDT Ventricular Rate:  58 PR Interval:  164 QRS Duration: 88 QT Interval:  402 QTC Calculation: 394 R Axis:   68 Text Interpretation:  Sinus bradycardia with frequent Premature  ventricular complexes Possible Left atrial enlargement Septal infarct ,  age undetermined Abnormal ECG When compared with ECG of 21-Sep-2011 09:14,  Premature ventricular complexes are now Present Confirmed by Lirio Bach  MD-J,  Alonia Dibuono [id number]) on 08/15/2013 12:12:22 PM      MDM   Final diagnoses:  Peripheral vertigo    Normal neuro exam.  Pt has not had any trouble with his gait.  Clear positional component.  Suspect peripheral vertigo.   Dc home with symptomatic meds.  Follow up with PCP.  Warning signs discussed.  I personally performed the services described in this documentation, which was scribed in my presence.  The recorded information has been reviewed and is  accurate.   Thomas Frames, MD 08/15/13 [phone]

## 2013-08-15 NOTE — Discharge Instructions (Signed)
Vertigo Vertigo means you feel like you or your surroundings are moving when they are not. Vertigo can be dangerous if it occurs when you are at work, driving, or performing difficult activities.  CAUSES  Vertigo occurs when there is a conflict of signals sent to your brain from the visual and sensory systems in your body. There are many different causes of vertigo, including:  Infections, especially in the inner ear.  A bad reaction to a drug or misuse of alcohol and medicines.  Withdrawal from drugs or alcohol.  Rapidly changing positions, such as lying down or rolling over in bed.  A migraine headache.  Decreased blood flow to the brain.  Increased pressure in the brain from a head injury, infection, tumor, or bleeding. SYMPTOMS  You may feel as though the world is spinning around or you are falling to the ground. Because your balance is upset, vertigo can cause nausea and vomiting. You may have involuntary eye movements (nystagmus). DIAGNOSIS  Vertigo is usually diagnosed by physical exam. If the cause of your vertigo is unknown, your caregiver may perform imaging tests, such as an MRI scan (magnetic resonance imaging). TREATMENT  Most cases of vertigo resolve on their own, without treatment. Depending on the cause, your caregiver may prescribe certain medicines. If your vertigo is related to body position issues, your caregiver may recommend movements or procedures to correct the problem. In rare cases, if your vertigo is caused by certain inner ear problems, you may need surgery. HOME CARE INSTRUCTIONS   Follow your caregiver's instructions.  Avoid driving.  Avoid operating heavy machinery.  Avoid performing any tasks that would be dangerous to you or others during a vertigo episode.  Tell your caregiver if you notice that certain medicines seem to be causing your vertigo. Some of the medicines used to treat vertigo episodes can actually make them worse in some people. SEEK  IMMEDIATE MEDICAL CARE IF:   Your medicines do not relieve your vertigo or are making it worse.  You develop problems with talking, walking, weakness, or using your arms, hands, or legs.  You develop severe headaches.  Your nausea or vomiting continues or gets worse.  You develop visual changes.  A family member notices behavioral changes.  Your condition gets worse. MAKE SURE YOU:  Understand these instructions.  Will watch your condition.  Will get help right away if you are not doing well or get worse. Document Released: 02/11/2005 Document Revised: 07/27/2011 Document Reviewed: 11/20/2010 [employer] Patient Information 2014 [employer].  

## 2013-08-15 NOTE — ED Notes (Signed)
Pt reports dizziness and nausea are better.

## 2013-08-15 NOTE — ED Notes (Signed)
Pt reports feeling dizzy since waking yesterday morning.  Denies any pain.  Reports nausea, no vomiting.

## 2013-08-15 NOTE — ED Notes (Signed)
Dizzy - room "spins".  Nausea, no vomiting.  No pain.  No weakness

## 2013-08-31 DIAGNOSIS — Z681 Body mass index (BMI) 19 or less, adult: Secondary | ICD-10-CM | POA: Diagnosis not present

## 2013-08-31 DIAGNOSIS — H8309 Labyrinthitis, unspecified ear: Secondary | ICD-10-CM | POA: Diagnosis not present

## 2013-08-31 DIAGNOSIS — R42 Dizziness and giddiness: Secondary | ICD-10-CM | POA: Diagnosis not present

## 2013-10-19 ENCOUNTER — Ambulatory Visit (INDEPENDENT_AMBULATORY_CARE_PROVIDER_SITE_OTHER): Payer: Medicare Other | Admitting: Otolaryngology

## 2013-10-19 DIAGNOSIS — R42 Dizziness and giddiness: Secondary | ICD-10-CM

## 2013-10-19 DIAGNOSIS — H903 Sensorineural hearing loss, bilateral: Secondary | ICD-10-CM

## 2013-10-19 DIAGNOSIS — H8110 Benign paroxysmal vertigo, unspecified ear: Secondary | ICD-10-CM

## 2013-11-16 ENCOUNTER — Ambulatory Visit (INDEPENDENT_AMBULATORY_CARE_PROVIDER_SITE_OTHER): Payer: Medicare Other | Admitting: Otolaryngology

## 2014-02-22 DIAGNOSIS — Z681 Body mass index (BMI) 19 or less, adult: Secondary | ICD-10-CM | POA: Diagnosis not present

## 2014-02-22 DIAGNOSIS — Z0000 Encounter for general adult medical examination without abnormal findings: Secondary | ICD-10-CM | POA: Diagnosis not present

## 2014-04-30 DIAGNOSIS — R944 Abnormal results of kidney function studies: Secondary | ICD-10-CM | POA: Diagnosis not present

## 2014-07-03 DIAGNOSIS — H40033 Anatomical narrow angle, bilateral: Secondary | ICD-10-CM | POA: Diagnosis not present

## 2014-07-03 DIAGNOSIS — H2513 Age-related nuclear cataract, bilateral: Secondary | ICD-10-CM | POA: Diagnosis not present

## 2014-07-18 DIAGNOSIS — G44219 Episodic tension-type headache, not intractable: Secondary | ICD-10-CM | POA: Diagnosis not present

## 2014-11-23 DIAGNOSIS — Z681 Body mass index (BMI) 19 or less, adult: Secondary | ICD-10-CM | POA: Diagnosis not present

## 2014-11-23 DIAGNOSIS — T07 Unspecified multiple injuries: Secondary | ICD-10-CM | POA: Diagnosis not present

## 2014-11-23 DIAGNOSIS — Z1389 Encounter for screening for other disorder: Secondary | ICD-10-CM | POA: Diagnosis not present

## 2015-01-18 DIAGNOSIS — Z681 Body mass index (BMI) 19 or less, adult: Secondary | ICD-10-CM | POA: Diagnosis not present

## 2015-01-18 DIAGNOSIS — N183 Chronic kidney disease, stage 3 (moderate): Secondary | ICD-10-CM | POA: Diagnosis not present

## 2015-01-18 DIAGNOSIS — Z1389 Encounter for screening for other disorder: Secondary | ICD-10-CM | POA: Diagnosis not present

## 2015-01-18 DIAGNOSIS — R972 Elevated prostate specific antigen [PSA]: Secondary | ICD-10-CM | POA: Diagnosis not present

## 2015-01-18 DIAGNOSIS — Z0000 Encounter for general adult medical examination without abnormal findings: Secondary | ICD-10-CM | POA: Diagnosis not present

## 2015-01-18 DIAGNOSIS — D508 Other iron deficiency anemias: Secondary | ICD-10-CM | POA: Diagnosis not present

## 2015-01-18 DIAGNOSIS — Z125 Encounter for screening for malignant neoplasm of prostate: Secondary | ICD-10-CM | POA: Diagnosis not present

## 2015-05-03 DIAGNOSIS — Z681 Body mass index (BMI) 19 or less, adult: Secondary | ICD-10-CM | POA: Diagnosis not present

## 2015-05-03 DIAGNOSIS — Z1389 Encounter for screening for other disorder: Secondary | ICD-10-CM | POA: Diagnosis not present

## 2015-05-03 DIAGNOSIS — K219 Gastro-esophageal reflux disease without esophagitis: Secondary | ICD-10-CM | POA: Diagnosis not present

## 2015-06-04 DIAGNOSIS — M67431 Ganglion, right wrist: Secondary | ICD-10-CM | POA: Diagnosis not present

## 2015-06-04 DIAGNOSIS — Z681 Body mass index (BMI) 19 or less, adult: Secondary | ICD-10-CM | POA: Diagnosis not present

## 2015-06-04 DIAGNOSIS — Z1389 Encounter for screening for other disorder: Secondary | ICD-10-CM | POA: Diagnosis not present

## 2015-06-17 DIAGNOSIS — M79641 Pain in right hand: Secondary | ICD-10-CM | POA: Diagnosis not present

## 2015-06-17 DIAGNOSIS — S62231A Other displaced fracture of base of first metacarpal bone, right hand, initial encounter for closed fracture: Secondary | ICD-10-CM | POA: Diagnosis not present

## 2015-09-05 ENCOUNTER — Other Ambulatory Visit (HOSPITAL_COMMUNITY): Payer: Self-pay | Admitting: Family Medicine

## 2015-09-05 ENCOUNTER — Ambulatory Visit (HOSPITAL_COMMUNITY)
Admission: RE | Admit: 2015-09-05 | Discharge: 2015-09-05 | Disposition: A | Discharge status: Home / Self Care | Payer: Medicare Other | Source: Ambulatory Visit | Attending: Family Medicine | Admitting: Family Medicine

## 2015-09-05 DIAGNOSIS — M4854XA Collapsed vertebra, not elsewhere classified, thoracic region, initial encounter for fracture: Secondary | ICD-10-CM | POA: Diagnosis not present

## 2015-09-05 DIAGNOSIS — R1310 Dysphagia, unspecified: Secondary | ICD-10-CM

## 2015-09-05 DIAGNOSIS — E441 Mild protein-calorie malnutrition: Secondary | ICD-10-CM | POA: Diagnosis not present

## 2015-09-05 DIAGNOSIS — J449 Chronic obstructive pulmonary disease, unspecified: Secondary | ICD-10-CM | POA: Insufficient documentation

## 2015-09-05 DIAGNOSIS — Z1389 Encounter for screening for other disorder: Secondary | ICD-10-CM | POA: Diagnosis not present

## 2015-09-05 DIAGNOSIS — R634 Abnormal weight loss: Secondary | ICD-10-CM | POA: Diagnosis not present

## 2015-09-05 DIAGNOSIS — Z681 Body mass index (BMI) 19 or less, adult: Secondary | ICD-10-CM | POA: Diagnosis not present

## 2015-09-05 DIAGNOSIS — K219 Gastro-esophageal reflux disease without esophagitis: Secondary | ICD-10-CM | POA: Diagnosis not present

## 2015-09-09 DIAGNOSIS — Z1389 Encounter for screening for other disorder: Secondary | ICD-10-CM | POA: Diagnosis not present

## 2015-09-09 DIAGNOSIS — D508 Other iron deficiency anemias: Secondary | ICD-10-CM | POA: Diagnosis not present

## 2015-09-09 DIAGNOSIS — N183 Chronic kidney disease, stage 3 (moderate): Secondary | ICD-10-CM | POA: Diagnosis not present

## 2015-09-09 DIAGNOSIS — D649 Anemia, unspecified: Secondary | ICD-10-CM | POA: Diagnosis not present

## 2015-09-09 DIAGNOSIS — Z681 Body mass index (BMI) 19 or less, adult: Secondary | ICD-10-CM | POA: Diagnosis not present

## 2015-09-09 DIAGNOSIS — N289 Disorder of kidney and ureter, unspecified: Secondary | ICD-10-CM | POA: Diagnosis not present

## 2015-09-23 ENCOUNTER — Encounter: Payer: Self-pay | Admitting: Internal Medicine

## 2015-10-01 ENCOUNTER — Ambulatory Visit (INDEPENDENT_AMBULATORY_CARE_PROVIDER_SITE_OTHER): Payer: Medicare Other | Admitting: Nurse Practitioner

## 2015-10-01 ENCOUNTER — Encounter: Payer: Self-pay | Admitting: Nurse Practitioner

## 2015-10-01 ENCOUNTER — Telehealth: Payer: Self-pay | Admitting: Nurse Practitioner

## 2015-10-01 VITALS — BP 119/64 | HR 73 | Temp 98.1°F | Ht 76.0 in | Wt 143.0 lb

## 2015-10-01 DIAGNOSIS — K219 Gastro-esophageal reflux disease without esophagitis: Secondary | ICD-10-CM

## 2015-10-01 HISTORY — PX: OTHER SURGICAL HISTORY: SHX169

## 2015-10-01 MED ORDER — OMEPRAZOLE 20 MG PO CPDR: 20.0000 mg | DELAYED_RELEASE_CAPSULE | Freq: Two times a day (BID) | ORAL | Status: DC

## 2015-10-01 NOTE — Addendum Note (Signed)
Addended by: Gordy Levan, ERIC A on: 10/01/2015 01:49 PM   Modules accepted: Orders

## 2015-10-01 NOTE — Progress Notes (Signed)
Per patient, was given Pantoprazole 40 mg which was ineffective. Will trial on omeprazole 20 mg bid and if continues to be symtomatic at next office visit can consider EGD for further evaluation.

## 2015-10-01 NOTE — Telephone Encounter (Signed)
Pt is aware.  

## 2015-10-01 NOTE — Progress Notes (Signed)
cc'ed to pcp °

## 2015-10-01 NOTE — Telephone Encounter (Signed)
Please notify the patient I sent in the Rx to his pharmacy: Omeprazole 20 mg bid, 30 mins before a meal. Otherwise, continue the plan for follow-up

## 2015-10-01 NOTE — Assessment & Plan Note (Signed)
Previously had indigestion on a very intermittent basis. In the past 3 weeks she has had worsening symptoms. His PCP may have started him on a PPI which was ineffective, he will check to see which medication that was at home. We will also contact PCP to try to obtain this information. We'll may have that, we will start him on a different PPI and bring him back in 2 months for further evaluation. If he continues to be symptomatic may necessitate upper endoscopy if he is agreeable.

## 2015-10-01 NOTE — Patient Instructions (Signed)
1. We will contact her primary care provider to find out which medication they started you on. 2. If you're able to find it at home, call us and let us know what the medication was. 3. When we have that information we will start you on an acid blocker that is different than the one they tried. 4. Return for follow-up in 2 months to evaluate your symptoms and make any further necessary.

## 2015-10-01 NOTE — Progress Notes (Signed)
Primary Care Physician:  Purvis Kilts, MD Primary Gastroenterologist:  Dr. Gala Romney  Chief Complaint  Patient presents with  . Gastroesophageal Reflux  . Weight Loss    HPI:   [name] is a 82 y.o. male who presents on referral from primary care for dysphagia and weight loss. Last pertinent visit with primary care on 09/05/2015 for follow-up on reflux at which point patient described associated symptoms of dysphagia, nausea, fatigue, weight loss. The patient was subsequently referred to GI for further evaluation. Stool Hemoccult cards collected for 2617 were negative 2. Patient with chronic renal disease with last hemoglobin 1.87 and GFR estimated at 33 on 09/09/2015. Patient also noted with anemia on the same day with a hemoglobin of 9.9, ferritin high at 457, TIBC low at 239, iron normal at 86, iron sat percentage normal at 36. Likely anemia of chronic disease with renal insufficiency as a significant component. No colonoscopy or endoscopy found in our system.  Today he states he is NOT having dysphagia issues. Is having worsening indigestion. Worsening began about 3 weeks ago, no dietary changes. Previously only with GERD symptoms intermittently with dietary indiscretion. At this time it is persistent and independent of dietary choices. Has reflux with every meal, takes TUMS or Rolaids, which is effective. Symptoms include esophageal burning, bitter taste, frequent belching. Denies N/V, abdominal pain, hematochezia, melena, weight loss. Occasional darkened stools with certain dietary selections. Denies chest pain, worsening dyspnea, dizziness, lightheadedness, syncope, near syncope. Denies any other upper or lower GI symptoms.   Past Medical History  Diagnosis Date  . Chronic kidney insufficiency     Past Surgical History  Procedure Laterality Date  . None to date  10/01/15    No current outpatient prescriptions on file.   No current facility-administered medications  for this visit.    Allergies as of 10/01/2015  . (No Known Allergies)    Family History  Problem Relation Age of Onset  . Kidney disease Mother   . Colon cancer Neg Hx   . Kidney disease Sister     Social History   Social History  . Marital Status: Widowed    Spouse Name: N/A  . Number of Children: N/A  . Years of Education: N/A   Occupational History  . Not on file.   Social History Main Topics  . Smoking status: Former Smoker    Quit date: 10/01/1990  . Smokeless tobacco: Never Used  . Alcohol Use: 0.0 oz/week    0 Standard drinks or equivalent per week     Comment: Glass of wine 2-3 times a week  . Drug Use: No  . Sexual Activity: Not on file   Other Topics Concern  . Not on file   Social History Narrative    Review of Systems: General: Negative for anorexia, weight loss, fever, chills, fatigue, weakness. ENT: Negative for hoarseness, difficulty swallowing. CV: Negative for chest pain, angina, palpitations, peripheral edema.  Respiratory: Negative for dyspnea at rest, cough, sputum, wheezing.  GI: See history of present illness. MS: Negative for joint pain, low back pain.  Derm: Negative for rash or itching.  Endo: Negative for unusual weight change.  Heme: Negative for bruising or bleeding. Allergy: Negative for rash or hives.    Physical Exam: BP 119/64 mmHg  Pulse 73  Temp(Src) 98.1 F (36.7 C) (Oral)  Ht 6\' 4"  (1.93 m)  Wt 143 lb (64.864 kg)  BMI 17.41 kg/m2 General:   Alert and oriented. Pleasant  and cooperative. Well-nourished and well-developed.  Head:  Normocephalic and atraumatic. Eyes:  Without icterus, sclera clear and conjunctiva pink.  Ears:  Normal auditory acuity. Cardiovascular:  S1, S2 present without murmurs appreciated. Extremities without clubbing or edema. Respiratory:  Clear to auscultation bilaterally. No wheezes, rales, or rhonchi. No distress.  Gastrointestinal:  +BS, soft, non-tender and non-distended. No HSM noted. No  guarding or rebound. No masses appreciated.  Rectal:  Deferred  Musculoskalatal:  Symmetrical without gross deformities. Neurologic:  Alert and oriented x4;  grossly normal neurologically. Psych:  Alert and cooperative. Normal mood and affect. Heme/Lymph/Immune: No excessive bruising noted.    10/01/2015 10:02 AM   Disclaimer: This note was dictated with voice recognition software. Similar sounding words can inadvertently be transcribed and may not be corrected upon review.

## 2015-10-10 DIAGNOSIS — D649 Anemia, unspecified: Secondary | ICD-10-CM | POA: Diagnosis not present

## 2015-10-10 DIAGNOSIS — N189 Chronic kidney disease, unspecified: Secondary | ICD-10-CM | POA: Diagnosis not present

## 2015-10-10 DIAGNOSIS — R319 Hematuria, unspecified: Secondary | ICD-10-CM | POA: Diagnosis not present

## 2015-10-21 ENCOUNTER — Other Ambulatory Visit (HOSPITAL_COMMUNITY): Payer: Self-pay | Admitting: Medical

## 2015-10-21 DIAGNOSIS — N183 Chronic kidney disease, stage 3 unspecified: Secondary | ICD-10-CM

## 2015-10-31 ENCOUNTER — Ambulatory Visit (HOSPITAL_COMMUNITY): Admission: RE | Admit: 2015-10-31 | Payer: Medicare Other | Source: Ambulatory Visit

## 2015-10-31 DIAGNOSIS — R809 Proteinuria, unspecified: Secondary | ICD-10-CM | POA: Diagnosis not present

## 2015-10-31 DIAGNOSIS — Z79899 Other long term (current) drug therapy: Secondary | ICD-10-CM | POA: Diagnosis not present

## 2015-10-31 DIAGNOSIS — I10 Essential (primary) hypertension: Secondary | ICD-10-CM | POA: Diagnosis not present

## 2015-10-31 DIAGNOSIS — E559 Vitamin D deficiency, unspecified: Secondary | ICD-10-CM | POA: Diagnosis not present

## 2015-10-31 DIAGNOSIS — N183 Chronic kidney disease, stage 3 (moderate): Secondary | ICD-10-CM | POA: Diagnosis not present

## 2015-10-31 DIAGNOSIS — D509 Iron deficiency anemia, unspecified: Secondary | ICD-10-CM | POA: Diagnosis not present

## 2015-11-08 ENCOUNTER — Ambulatory Visit (HOSPITAL_COMMUNITY)
Admission: RE | Admit: 2015-11-08 | Discharge: 2015-11-08 | Disposition: A | Discharge status: Home / Self Care | Payer: Medicare Other | Source: Ambulatory Visit | Attending: Medical | Admitting: Medical

## 2015-11-08 DIAGNOSIS — N281 Cyst of kidney, acquired: Secondary | ICD-10-CM | POA: Insufficient documentation

## 2015-11-08 DIAGNOSIS — N3289 Other specified disorders of bladder: Secondary | ICD-10-CM | POA: Diagnosis not present

## 2015-11-08 DIAGNOSIS — N183 Chronic kidney disease, stage 3 unspecified: Secondary | ICD-10-CM

## 2015-11-12 DIAGNOSIS — I10 Essential (primary) hypertension: Secondary | ICD-10-CM | POA: Diagnosis not present

## 2015-11-12 DIAGNOSIS — D649 Anemia, unspecified: Secondary | ICD-10-CM | POA: Diagnosis not present

## 2015-11-12 DIAGNOSIS — N189 Chronic kidney disease, unspecified: Secondary | ICD-10-CM | POA: Diagnosis not present

## 2015-11-28 DIAGNOSIS — H43813 Vitreous degeneration, bilateral: Secondary | ICD-10-CM | POA: Diagnosis not present

## 2015-11-28 DIAGNOSIS — H25813 Combined forms of age-related cataract, bilateral: Secondary | ICD-10-CM | POA: Diagnosis not present

## 2015-12-04 ENCOUNTER — Ambulatory Visit (INDEPENDENT_AMBULATORY_CARE_PROVIDER_SITE_OTHER): Payer: Medicare Other | Admitting: Nurse Practitioner

## 2015-12-04 ENCOUNTER — Encounter: Payer: Self-pay | Admitting: Nurse Practitioner

## 2015-12-04 VITALS — BP 99/56 | HR 71 | Temp 98.0°F | Ht 76.0 in | Wt 143.8 lb

## 2015-12-04 DIAGNOSIS — K219 Gastro-esophageal reflux disease without esophagitis: Secondary | ICD-10-CM | POA: Diagnosis not present

## 2015-12-04 NOTE — Patient Instructions (Signed)
1. Continue the diet changes you have been making. 2. Return for follow-up if you have any new or returning symptoms.

## 2015-12-04 NOTE — Progress Notes (Signed)
    Referring Provider: Sharilyn Sites, MD Primary Care Physician:  Purvis Kilts, MD Primary GI:  Dr. Gala Romney  Chief Complaint  Patient presents with  . Follow-up    Doing much better    HPI:   [name] is a 82 y.o. male who presents for follow-up on GERD. He was last seen in our office on 10/01/2015 the same. At that time he is having worsening GERD symptoms, denied dysphagia symptoms. Had previously been given Protonix but is not effective, had been taking Tums or Rolaids. He was started on omeprazole and advised to return in 2 months for further evaluation. If continued symptomatic, likely need for upper endoscopy.  Today he states he's doing well. Prilosec gave him diarrhea. He is doing well with dietary changes including no fried/greasy foods and no spicy foods. Currently asymptomatic without medication and on these changes. Denies abdominal pain, N/V, hematochezia, melena, fever, chills, bowel habit changes. Denies chest pain, dyspnea, dizziness, lightheadedness, syncope, near syncope. Denies any other upper or lower GI symptoms.  Past Medical History  Diagnosis Date  . Chronic kidney insufficiency     Past Surgical History  Procedure Laterality Date  . None to date  10/01/15    No current outpatient prescriptions on file.   No current facility-administered medications for this visit.    Allergies as of 12/04/2015  . (No Known Allergies)    Family History  Problem Relation Age of Onset  . Kidney disease Mother   . Colon cancer Neg Hx   . Kidney disease Sister     Social History   Social History  . Marital Status: Widowed    Spouse Name: N/A  . Number of Children: N/A  . Years of Education: N/A   Social History Main Topics  . Smoking status: Former Smoker    Quit date: 10/01/1990  . Smokeless tobacco: Never Used     Comment: Quit x 25-30 years  . Alcohol Use: 0.0 oz/week    0 Standard drinks or equivalent per week     Comment: Glass of wine  2-3 times a week  . Drug Use: No  . Sexual Activity: Not Asked   Other Topics Concern  . None   Social History Narrative    Review of Systems: General: Negative for anorexia, weight loss, fever, chills, fatigue, weakness. ENT: Negative for hoarseness, difficulty swallowing. CV: Negative for chest pain, angina, palpitations, peripheral edema.  Respiratory: Negative for dyspnea at rest, cough, sputum, wheezing.  GI: See history of present illness. Endo: Negative for unusual weight change.   Physical Exam: BP 99/56 mmHg  Pulse 71  Temp(Src) 98 F (36.7 C) (Oral)  Ht 6\' 4"  (1.93 m)  Wt 143 lb 12.8 oz (65.227 kg)  BMI 17.51 kg/m2 General:   Alert and oriented. Pleasant and cooperative. Well-nourished and well-developed.  Ears:  Normal auditory acuity. Cardiovascular:  S1, S2 present without murmurs appreciated. Extremities without clubbing or edema. Respiratory:  Clear to auscultation bilaterally. No wheezes, rales, or rhonchi. No distress.  Gastrointestinal:  +BS, soft, non-tender and non-distended. No HSM noted. No guarding or rebound. No masses appreciated.  Rectal:  Deferred  Psych:  Alert and cooperative. Normal mood and affect. Heme/Lymph/Immune: No excessive bruising noted.    12/04/2015 1:56 PM   Disclaimer: This note was dictated with voice recognition software. Similar sounding words can inadvertently be transcribed and may not be corrected upon review.

## 2015-12-04 NOTE — Progress Notes (Signed)
CC'ED TO PCP 

## 2015-12-04 NOTE — Assessment & Plan Note (Signed)
Symptoms have resolved at this time without medication after significant dietary changes including cutting out fried, greasy, spicy foods. Recommended continue dietary changes, notify us if any worsening or recurrent symptoms. Return for follow-up as needed.

## 2016-02-10 DIAGNOSIS — H25811 Combined forms of age-related cataract, right eye: Secondary | ICD-10-CM | POA: Diagnosis not present

## 2016-02-10 DIAGNOSIS — H25813 Combined forms of age-related cataract, bilateral: Secondary | ICD-10-CM | POA: Diagnosis not present

## 2016-02-10 DIAGNOSIS — H52223 Regular astigmatism, bilateral: Secondary | ICD-10-CM | POA: Diagnosis not present

## 2016-02-10 DIAGNOSIS — H5203 Hypermetropia, bilateral: Secondary | ICD-10-CM | POA: Diagnosis not present

## 2016-03-04 NOTE — Patient Instructions (Signed)
Your procedure is scheduled on: 03/09/2016  Report to [facility] at   56   AM.  Call this number if you have problems the morning of surgery: [phone]   Do not eat food or drink liquids :After Midnight.      Take these medicines the morning of surgery with A SIP OF WATER: none   Do not wear jewelry, make-up or nail polish.  Do not wear lotions, powders, or perfumes. You may wear deodorant.  Do not shave 48 hours prior to surgery.  Do not bring valuables to the hospital.  Contacts, dentures or bridgework may not be worn into surgery.  Leave suitcase in the car. After surgery it may be brought to your room.  For patients admitted to the hospital, checkout time is 11:00 AM the day of discharge.   Patients discharged the day of surgery will not be allowed to drive home.  :     Please read over the following fact sheets that you were given: Coughing and Deep Breathing, Surgical Site Infection Prevention, Anesthesia Post-op Instructions and Care and Recovery After Surgery    Cataract A cataract is a clouding of the lens of the eye. When a lens becomes cloudy, vision is reduced based on the degree and nature of the clouding. Many cataracts reduce vision to some degree. Some cataracts make people more near-sighted as they develop. Other cataracts increase glare. Cataracts that are ignored and become worse can sometimes look white. The white color can be seen through the pupil. CAUSES   Aging. However, cataracts may occur at any age, even in newborns.   Certain drugs.   Trauma to the eye.   Certain diseases such as diabetes.   Specific eye diseases such as chronic inflammation inside the eye or a sudden attack of a rare form of glaucoma.   Inherited or acquired medical problems.  SYMPTOMS   Gradual, progressive drop in vision in the affected eye.   Severe, rapid visual loss. This most often happens when trauma is the cause.  DIAGNOSIS  To detect a cataract, an eye doctor examines  the lens. Cataracts are best diagnosed with an exam of the eyes with the pupils enlarged (dilated) by drops.  TREATMENT  For an early cataract, vision may improve by using different eyeglasses or stronger lighting. If that does not help your vision, surgery is the only effective treatment. A cataract needs to be surgically removed when vision loss interferes with your everyday activities, such as driving, reading, or watching TV. A cataract may also have to be removed if it prevents examination or treatment of another eye problem. Surgery removes the cloudy lens and usually replaces it with a substitute lens (intraocular lens, IOL).  At a time when both you and your doctor agree, the cataract will be surgically removed. If you have cataracts in both eyes, only one is usually removed at a time. This allows the operated eye to heal and be out of danger from any possible problems after surgery (such as infection or poor wound healing). In rare cases, a cataract may be doing damage to your eye. In these cases, your caregiver may advise surgical removal right away. The vast majority of people who have cataract surgery have better vision afterward. HOME CARE INSTRUCTIONS  If you are not planning surgery, you may be asked to do the following:  Use different eyeglasses.   Use stronger or brighter lighting.   Ask your eye doctor about reducing your medicine  dose or changing medicines if it is thought that a medicine caused your cataract. Changing medicines does not make the cataract go away on its own.   Become familiar with your surroundings. Poor vision can lead to injury. Avoid bumping into things on the affected side. You are at a higher risk for tripping or falling.   Exercise extreme care when driving or operating machinery.   Wear sunglasses if you are sensitive to bright light or experiencing problems with glare.  SEEK IMMEDIATE MEDICAL CARE IF:   You have a worsening or sudden vision loss.    You notice redness, swelling, or increasing pain in the eye.   You have a fever.  Document Released: 05/04/2005 Document Revised: 04/23/2011 Document Reviewed: 12/26/2010 [facility] Patient Information 2012 [email or web address].PATIENT INSTRUCTIONS POST-ANESTHESIA  IMMEDIATELY FOLLOWING SURGERY:  Do not drive or operate machinery for the first twenty four hours after surgery.  Do not make any important decisions for twenty four hours after surgery or while taking narcotic pain medications or sedatives.  If you develop intractable nausea and vomiting or a severe headache please notify your doctor immediately.  FOLLOW-UP:  Please make an appointment with your surgeon as instructed. You do not need to follow up with anesthesia unless specifically instructed to do so.  WOUND CARE INSTRUCTIONS (if applicable):  Keep a dry clean dressing on the anesthesia/puncture wound site if there is drainage.  Once the wound has quit draining you may leave it open to air.  Generally you should leave the bandage intact for twenty four hours unless there is drainage.  If the epidural site drains for more than 36-48 hours please call the anesthesia department.  QUESTIONS?:  Please feel free to call your physician or the hospital operator if you have any questions, and they will be happy to assist you.

## 2016-03-05 ENCOUNTER — Encounter (HOSPITAL_COMMUNITY): Payer: Self-pay

## 2016-03-05 ENCOUNTER — Other Ambulatory Visit: Payer: Self-pay

## 2016-03-05 ENCOUNTER — Encounter (HOSPITAL_COMMUNITY)
Admission: RE | Admit: 2016-03-05 | Discharge: 2016-03-05 | Disposition: A | Discharge status: Home / Self Care | Payer: Medicare Other | Source: Ambulatory Visit | Attending: Ophthalmology | Admitting: Ophthalmology

## 2016-03-05 DIAGNOSIS — Z01818 Encounter for other preprocedural examination: Secondary | ICD-10-CM | POA: Diagnosis not present

## 2016-03-05 LAB — BASIC METABOLIC PANEL
Anion gap: 4 — ABNORMAL LOW (ref 5–15)
BUN: 39 mg/dL — ABNORMAL HIGH (ref 6–20)
CO2: 24 mmol/L (ref 22–32)
Calcium: 8.9 mg/dL (ref 8.9–10.3)
Chloride: 108 mmol/L (ref 101–111)
Creatinine, Ser: 1.9 mg/dL — ABNORMAL HIGH (ref 0.61–1.24)
GFR calc Af Amer: 36 mL/min — ABNORMAL LOW (ref >60)
GFR calc non Af Amer: 31 mL/min — ABNORMAL LOW (ref >60)
Glucose, Bld: 81 mg/dL (ref 65–99)
Potassium: 4.3 mmol/L (ref 3.5–5.1)
Sodium: 136 mmol/L (ref 135–145)

## 2016-03-05 LAB — CBC WITH DIFFERENTIAL/PLATELET
Basophils Absolute: 0 10*3/uL (ref 0.0–0.1)
Basophils Relative: 0 %
Eosinophils Absolute: 0 10*3/uL (ref 0.0–0.7)
Eosinophils Relative: 1 %
HCT: 31.5 % — ABNORMAL LOW (ref 39.0–52.0)
Hemoglobin: 10.3 g/dL — ABNORMAL LOW (ref 13.0–17.0)
Lymphocytes Relative: 38 %
Lymphs Abs: 1.4 10*3/uL (ref 0.7–4.0)
MCH: 27.9 pg (ref 26.0–34.0)
MCHC: 32.7 g/dL (ref 30.0–36.0)
MCV: 85.4 fL (ref 78.0–100.0)
Monocytes Absolute: 0.5 10*3/uL (ref 0.1–1.0)
Monocytes Relative: 13 %
Neutro Abs: 1.8 10*3/uL (ref 1.7–7.7)
Neutrophils Relative %: 48 %
Platelets: 161 10*3/uL (ref 150–400)
RBC: 3.69 MIL/uL — ABNORMAL LOW (ref 4.22–5.81)
RDW: 20.3 % — ABNORMAL HIGH (ref 11.5–15.5)
WBC: 3.7 10*3/uL — ABNORMAL LOW (ref 4.0–10.5)

## 2016-03-09 ENCOUNTER — Encounter (HOSPITAL_COMMUNITY): Payer: Self-pay | Admitting: *Deleted

## 2016-03-09 ENCOUNTER — Ambulatory Visit (HOSPITAL_COMMUNITY): Payer: Medicare Other | Admitting: Anesthesiology

## 2016-03-09 ENCOUNTER — Encounter (HOSPITAL_COMMUNITY)
Admission: RE | Disposition: A | Discharge status: Home / Self Care | Payer: Self-pay | Source: Ambulatory Visit | Attending: Ophthalmology

## 2016-03-09 ENCOUNTER — Ambulatory Visit (HOSPITAL_COMMUNITY)
Admission: RE | Admit: 2016-03-09 | Discharge: 2016-03-09 | Disposition: A | Discharge status: Home / Self Care | Payer: Medicare Other | Source: Ambulatory Visit | Attending: Ophthalmology | Admitting: Ophthalmology

## 2016-03-09 DIAGNOSIS — K219 Gastro-esophageal reflux disease without esophagitis: Secondary | ICD-10-CM | POA: Diagnosis not present

## 2016-03-09 DIAGNOSIS — H25811 Combined forms of age-related cataract, right eye: Secondary | ICD-10-CM | POA: Diagnosis not present

## 2016-03-09 DIAGNOSIS — Z87891 Personal history of nicotine dependence: Secondary | ICD-10-CM | POA: Insufficient documentation

## 2016-03-09 DIAGNOSIS — H2511 Age-related nuclear cataract, right eye: Secondary | ICD-10-CM | POA: Diagnosis not present

## 2016-03-09 HISTORY — PX: CATARACT EXTRACTION W/PHACO: SHX586

## 2016-03-09 SURGERY — PHACOEMULSIFICATION, CATARACT, WITH IOL INSERTION
Anesthesia: Monitor Anesthesia Care | Site: Eye | Laterality: Right

## 2016-03-09 MED ORDER — TETRACAINE HCL 0.5 % OP SOLN
1.0000 [drp] | OPHTHALMIC | Status: AC
  Administered 2016-03-09 (×3): 1 [drp] via OPHTHALMIC

## 2016-03-09 MED ORDER — LIDOCAINE HCL 3.5 % OP GEL
1.0000 "application " | Freq: Once | OPHTHALMIC | Status: AC
  Administered 2016-03-09: 1 via OPHTHALMIC

## 2016-03-09 MED ORDER — FENTANYL CITRATE (PF) 100 MCG/2ML IJ SOLN
25.0000 ug | INTRAMUSCULAR | Status: AC | PRN
  Administered 2016-03-09 (×2): 25 ug via INTRAVENOUS

## 2016-03-09 MED ORDER — PROVISC 10 MG/ML IO SOLN
INTRAOCULAR | Status: DC | PRN
  Administered 2016-03-09: 0.85 mL via INTRAOCULAR

## 2016-03-09 MED ORDER — EPINEPHRINE PF 1 MG/ML IJ SOLN
INTRAOCULAR | Status: DC | PRN
  Administered 2016-03-09: 500 mL

## 2016-03-09 MED ORDER — POVIDONE-IODINE 5 % OP SOLN
OPHTHALMIC | Status: DC | PRN
  Administered 2016-03-09: 1 via OPHTHALMIC

## 2016-03-09 MED ORDER — FENTANYL CITRATE (PF) 100 MCG/2ML IJ SOLN
INTRAMUSCULAR | Status: AC
  Filled 2016-03-09: qty 2

## 2016-03-09 MED ORDER — MIDAZOLAM HCL 2 MG/2ML IJ SOLN
1.0000 mg | INTRAMUSCULAR | Status: DC | PRN
  Administered 2016-03-09: 2 mg via INTRAVENOUS

## 2016-03-09 MED ORDER — BSS IO SOLN
INTRAOCULAR | Status: DC | PRN
  Administered 2016-03-09: 15 mL via INTRAOCULAR

## 2016-03-09 MED ORDER — NEOMYCIN-POLYMYXIN-DEXAMETH 3.5-10000-0.1 OP SUSP
OPHTHALMIC | Status: DC | PRN
  Administered 2016-03-09: 2 [drp] via OPHTHALMIC

## 2016-03-09 MED ORDER — PHENYLEPHRINE HCL 2.5 % OP SOLN
1.0000 [drp] | OPHTHALMIC | Status: AC
  Administered 2016-03-09 (×3): 1 [drp] via OPHTHALMIC

## 2016-03-09 MED ORDER — EPINEPHRINE PF 1 MG/ML IJ SOLN
INTRAMUSCULAR | Status: AC
  Filled 2016-03-09: qty 1

## 2016-03-09 MED ORDER — LACTATED RINGERS IV SOLN
INTRAVENOUS | Status: DC
  Administered 2016-03-09: 08:00:00 via INTRAVENOUS

## 2016-03-09 MED ORDER — CYCLOPENTOLATE-PHENYLEPHRINE 0.2-1 % OP SOLN
1.0000 [drp] | OPHTHALMIC | Status: AC
Start: 2016-03-09 — End: 2016-03-09
  Administered 2016-03-09 (×3): 1 [drp] via OPHTHALMIC

## 2016-03-09 MED ORDER — LIDOCAINE HCL (PF) 1 % IJ SOLN
INTRAMUSCULAR | Status: DC | PRN
  Administered 2016-03-09: .5 mL

## 2016-03-09 MED ORDER — MIDAZOLAM HCL 2 MG/2ML IJ SOLN
INTRAMUSCULAR | Status: AC
  Filled 2016-03-09: qty 2

## 2016-03-09 SURGICAL SUPPLY — 11 items
CLOTH BEACON ORANGE TIMEOUT ST (SAFETY) ×2 IMPLANT
EYE SHIELD UNIVERSAL CLEAR (GAUZE/BANDAGES/DRESSINGS) ×2 IMPLANT
GLOVE BIOGEL PI IND STRL 6.5 (GLOVE) ×1 IMPLANT
GLOVE BIOGEL PI INDICATOR 6.5 (GLOVE) ×1
GLOVE EXAM NITRILE MD LF STRL (GLOVE) ×2 IMPLANT
PAD ARMBOARD 7.5X6 YLW CONV (MISCELLANEOUS) ×2 IMPLANT
SIGHTPATH CAT PROC W REG LENS (Ophthalmic Related) ×2 IMPLANT
SYRINGE LUER LOK 1CC (MISCELLANEOUS) ×2 IMPLANT
TAPE SURG TRANSPORE 1 IN (GAUZE/BANDAGES/DRESSINGS) ×1 IMPLANT
TAPE SURGICAL TRANSPORE 1 IN (GAUZE/BANDAGES/DRESSINGS) ×1
WATER STERILE IRR 250ML POUR (IV SOLUTION) ×2 IMPLANT

## 2016-03-09 NOTE — Op Note (Signed)
Date of Admission: 03/09/2016  Date of Surgery: 03/09/2016   Pre-Op Dx: Cataract Right Eye  Post-Op Dx: Senile Combined Cataract Right  Eye,  Dx Code S23.953  Surgeon: Tonny Branch, M.D.  Assistants: None  Anesthesia: Topical with MAC  Indications: Painless, progressive loss of vision with compromise of daily activities.  Surgery: Cataract Extraction with Intraocular lens Implant Right Eye  Discription: The patient had dilating drops and viscous lidocaine placed into the Right eye in the pre-op holding area. After transfer to the operating room, a time out was performed. The patient was then prepped and draped. Beginning with a 64 degree blade a paracentesis port was made at the surgeon's 2 o'clock position. The anterior chamber was then filled with 1% non-preserved lidocaine. This was followed by filling the anterior chamber with Provisc.  A 2.67mm keratome blade was used to make a clear corneal incision at the temporal limbus.  A bent cystatome needle was used to create a continuous tear capsulotomy. Hydrodissection was performed with balanced salt solution on a Fine canula. The lens nucleus was then removed using the phacoemulsification handpiece. Residual cortex was removed with the I&A handpiece. The anterior chamber and capsular bag were refilled with Provisc. A posterior chamber intraocular lens was placed into the capsular bag with it's injector. The implant was positioned with the Kuglan hook. The Provisc was then removed from the anterior chamber and capsular bag with the I&A handpiece. Stromal hydration of the main incision and paracentesis port was performed with BSS on a Fine canula. The wounds were tested for leak which was negative. The patient tolerated the procedure well. There were no operative complications. The patient was then transferred to the recovery room in stable condition.  Complications: None  Specimen: None  EBL: None  Prosthetic device: Hoya iSert 250, power 17.5  D, SN [id number].

## 2016-03-09 NOTE — Transfer of Care (Signed)
Immediate Anesthesia Transfer of Care Note  Patient: [name]  Procedure(s) Performed: Procedure(s) with comments: CATARACT EXTRACTION PHACO AND INTRAOCULAR LENS PLACEMENT RIGHT EYE CDE=8.58 (Right) - right  Patient Location: Short stay  Anesthesia Type:MAC  Level of Consciousness: awake, alert , oriented and patient cooperative  Airway & Oxygen Therapy: Patient Spontanous Breathing  Post-op Assessment: Report given to RN and Post -op Vital signs reviewed and stable  Post vital signs: Reviewed and stable  Last Vitals:  Vitals:   03/09/16 0805 03/09/16 0810  BP: (!) 98/59 96/60  Pulse:    Resp: 14 16  Temp:      Last Pain:  Vitals:   03/09/16 0714  TempSrc: Oral      Patients Stated Pain Goal: 8 (34/28/76 8115)  Complications: No apparent anesthesia complications

## 2016-03-09 NOTE — Anesthesia Postprocedure Evaluation (Signed)
Anesthesia Post Note  Patient: [name]  Procedure(s) Performed: Procedure(s) (LRB): CATARACT EXTRACTION PHACO AND INTRAOCULAR LENS PLACEMENT RIGHT EYE CDE=8.58 (Right)  Patient location during evaluation: Short Stay Anesthesia Type: MAC Level of consciousness: awake and alert and oriented Pain management: pain level controlled Vital Signs Assessment: post-procedure vital signs reviewed and stable Respiratory status: spontaneous breathing Cardiovascular status: stable Postop Assessment: no signs of nausea or vomiting Anesthetic complications: no    Last Vitals:  Vitals:   03/09/16 0805 03/09/16 0810  BP: (!) 98/59 96/60  Pulse:    Resp: 14 16  Temp:      Last Pain:  Vitals:   03/09/16 0714  TempSrc: Oral                 ADAMS, AMY A

## 2016-03-09 NOTE — Anesthesia Preprocedure Evaluation (Signed)
Anesthesia Evaluation  Patient identified by MRN, date of birth, ID band Patient awake    Reviewed: Allergy & Precautions, NPO status , Patient's Chart, lab work & pertinent test results  Airway Mallampati: I  TM Distance: >3 FB     Dental  (+) Edentulous Upper, Partial Lower   Pulmonary former smoker,    breath sounds clear to auscultation       Cardiovascular negative cardio ROS   Rhythm:Regular Rate:Normal     Neuro/Psych    GI/Hepatic GERD  ,  Endo/Other    Renal/GU Renal InsufficiencyRenal disease     Musculoskeletal   Abdominal   Peds  Hematology   Anesthesia Other Findings   Reproductive/Obstetrics                             Anesthesia Physical Anesthesia Plan  ASA: III  Anesthesia Plan: MAC   Post-op Pain Management:    Induction:   Airway Management Planned: Nasal Cannula  Additional Equipment:   Intra-op Plan:   Post-operative Plan:   Informed Consent: I have reviewed the patients History and Physical, chart, labs and discussed the procedure including the risks, benefits and alternatives for the proposed anesthesia with the patient or authorized representative who has indicated his/her understanding and acceptance.     Plan Discussed with:   Anesthesia Plan Comments:         Anesthesia Quick Evaluation

## 2016-03-09 NOTE — H&P (Signed)
I have reviewed the H&P, the patient was re-examined, and I have identified no interval changes in medical condition and plan of care since the history and physical of record  

## 2016-03-09 NOTE — Discharge Instructions (Signed)

## 2016-03-09 NOTE — Anesthesia Procedure Notes (Signed)
Procedure Name: MAC Date/Time: 03/09/2016 8:16 AM Performed by: Andree Elk, AMY A Pre-anesthesia Checklist: Patient identified, Timeout performed, Emergency Drugs available, Suction available and Patient being monitored Oxygen Delivery Method: Nasal cannula

## 2016-03-12 ENCOUNTER — Encounter (HOSPITAL_COMMUNITY): Payer: Self-pay | Admitting: Ophthalmology

## 2016-03-20 ENCOUNTER — Other Ambulatory Visit (HOSPITAL_COMMUNITY)
Admission: RE | Admit: 2016-03-20 | Discharge: 2016-03-20 | Disposition: A | Discharge status: Home / Self Care | Payer: Medicare Other | Source: Ambulatory Visit | Attending: Nephrology | Admitting: Nephrology

## 2016-03-20 DIAGNOSIS — Z79899 Other long term (current) drug therapy: Secondary | ICD-10-CM | POA: Diagnosis not present

## 2016-03-20 DIAGNOSIS — E559 Vitamin D deficiency, unspecified: Secondary | ICD-10-CM | POA: Insufficient documentation

## 2016-03-20 DIAGNOSIS — N183 Chronic kidney disease, stage 3 (moderate): Secondary | ICD-10-CM | POA: Insufficient documentation

## 2016-03-20 DIAGNOSIS — R809 Proteinuria, unspecified: Secondary | ICD-10-CM | POA: Diagnosis not present

## 2016-03-20 DIAGNOSIS — D509 Iron deficiency anemia, unspecified: Secondary | ICD-10-CM | POA: Diagnosis not present

## 2016-03-20 DIAGNOSIS — I10 Essential (primary) hypertension: Secondary | ICD-10-CM | POA: Diagnosis not present

## 2016-03-20 LAB — PROTEIN / CREATININE RATIO, URINE
Creatinine, Urine: 170.46 mg/dL
Protein Creatinine Ratio: 0.07 mg/mg{Cre} (ref 0.00–0.15)
Total Protein, Urine: 12 mg/dL

## 2016-03-20 LAB — RENAL FUNCTION PANEL
Albumin: 4.3 g/dL (ref 3.5–5.0)
Anion gap: 4 — ABNORMAL LOW (ref 5–15)
BUN: 32 mg/dL — ABNORMAL HIGH (ref 6–20)
CO2: 25 mmol/L (ref 22–32)
Calcium: 9.1 mg/dL (ref 8.9–10.3)
Chloride: 109 mmol/L (ref 101–111)
Creatinine, Ser: 1.79 mg/dL — ABNORMAL HIGH (ref 0.61–1.24)
GFR calc Af Amer: 38 mL/min — ABNORMAL LOW (ref >60)
GFR calc non Af Amer: 33 mL/min — ABNORMAL LOW (ref >60)
Glucose, Bld: 86 mg/dL (ref 65–99)
Phosphorus: 2.4 mg/dL — ABNORMAL LOW (ref 2.5–4.6)
Potassium: 4 mmol/L (ref 3.5–5.1)
Sodium: 138 mmol/L (ref 135–145)

## 2016-03-20 LAB — IRON AND TIBC
Iron: 114 ug/dL (ref 45–182)
Saturation Ratios: 40 % — ABNORMAL HIGH (ref 17.9–39.5)
TIBC: 287 ug/dL (ref 250–450)
UIBC: 173 ug/dL

## 2016-03-20 LAB — FERRITIN: Ferritin: 200 ng/mL (ref 24–336)

## 2016-03-20 LAB — HEMOGLOBIN AND HEMATOCRIT, BLOOD
HCT: 31.4 % — ABNORMAL LOW (ref 39.0–52.0)
Hemoglobin: 10.3 g/dL — ABNORMAL LOW (ref 13.0–17.0)

## 2016-03-21 LAB — PTH, INTACT AND CALCIUM
Calcium, Total (PTH): 8.9 mg/dL (ref 8.6–10.2)
PTH: 44 pg/mL (ref 15–65)

## 2016-03-21 LAB — VITAMIN D 25 HYDROXY (VIT D DEFICIENCY, FRACTURES): Vit D, 25-Hydroxy: 39.2 ng/mL (ref 30.0–100.0)

## 2016-03-23 DIAGNOSIS — I10 Essential (primary) hypertension: Secondary | ICD-10-CM | POA: Diagnosis not present

## 2016-03-23 DIAGNOSIS — N183 Chronic kidney disease, stage 3 (moderate): Secondary | ICD-10-CM | POA: Diagnosis not present

## 2016-03-23 DIAGNOSIS — R319 Hematuria, unspecified: Secondary | ICD-10-CM | POA: Diagnosis not present

## 2016-03-23 DIAGNOSIS — D649 Anemia, unspecified: Secondary | ICD-10-CM | POA: Diagnosis not present

## 2016-04-06 DIAGNOSIS — H5202 Hypermetropia, left eye: Secondary | ICD-10-CM | POA: Diagnosis not present

## 2016-04-06 DIAGNOSIS — H52223 Regular astigmatism, bilateral: Secondary | ICD-10-CM | POA: Diagnosis not present

## 2016-04-06 DIAGNOSIS — H5231 Anisometropia: Secondary | ICD-10-CM | POA: Diagnosis not present

## 2016-04-06 DIAGNOSIS — H25812 Combined forms of age-related cataract, left eye: Secondary | ICD-10-CM | POA: Diagnosis not present

## 2016-04-07 ENCOUNTER — Encounter (HOSPITAL_COMMUNITY)
Admission: RE | Admit: 2016-04-07 | Discharge: 2016-04-07 | Disposition: A | Discharge status: Home / Self Care | Payer: Medicare Other | Source: Ambulatory Visit | Attending: Ophthalmology | Admitting: Ophthalmology

## 2016-04-13 ENCOUNTER — Ambulatory Visit (HOSPITAL_COMMUNITY): Payer: Medicare Other | Admitting: Anesthesiology

## 2016-04-13 ENCOUNTER — Encounter (HOSPITAL_COMMUNITY): Payer: Self-pay | Admitting: *Deleted

## 2016-04-13 ENCOUNTER — Encounter (HOSPITAL_COMMUNITY)
Admission: RE | Disposition: A | Discharge status: Home / Self Care | Payer: Self-pay | Source: Ambulatory Visit | Attending: Ophthalmology

## 2016-04-13 ENCOUNTER — Ambulatory Visit (HOSPITAL_COMMUNITY)
Admission: RE | Admit: 2016-04-13 | Discharge: 2016-04-13 | Disposition: A | Discharge status: Home / Self Care | Payer: Medicare Other | Source: Ambulatory Visit | Attending: Ophthalmology | Admitting: Ophthalmology

## 2016-04-13 DIAGNOSIS — H2512 Age-related nuclear cataract, left eye: Secondary | ICD-10-CM | POA: Diagnosis not present

## 2016-04-13 DIAGNOSIS — Z87891 Personal history of nicotine dependence: Secondary | ICD-10-CM | POA: Insufficient documentation

## 2016-04-13 DIAGNOSIS — H25812 Combined forms of age-related cataract, left eye: Secondary | ICD-10-CM | POA: Insufficient documentation

## 2016-04-13 HISTORY — PX: CATARACT EXTRACTION W/PHACO: SHX586

## 2016-04-13 SURGERY — PHACOEMULSIFICATION, CATARACT, WITH IOL INSERTION
Anesthesia: Monitor Anesthesia Care | Site: Eye | Laterality: Left

## 2016-04-13 MED ORDER — CYCLOPENTOLATE-PHENYLEPHRINE 0.2-1 % OP SOLN
1.0000 [drp] | OPHTHALMIC | Status: AC
  Administered 2016-04-13 (×3): 1 [drp] via OPHTHALMIC

## 2016-04-13 MED ORDER — POVIDONE-IODINE 5 % OP SOLN
OPHTHALMIC | Status: DC | PRN
  Administered 2016-04-13: 1 via OPHTHALMIC

## 2016-04-13 MED ORDER — FENTANYL CITRATE (PF) 100 MCG/2ML IJ SOLN
25.0000 ug | INTRAMUSCULAR | Status: AC | PRN
  Administered 2016-04-13 (×2): 25 ug via INTRAVENOUS

## 2016-04-13 MED ORDER — MIDAZOLAM HCL 2 MG/2ML IJ SOLN
INTRAMUSCULAR | Status: AC
  Filled 2016-04-13: qty 2

## 2016-04-13 MED ORDER — NEOMYCIN-POLYMYXIN-DEXAMETH 3.5-10000-0.1 OP SUSP
OPHTHALMIC | Status: DC | PRN
  Administered 2016-04-13: 2 [drp] via OPHTHALMIC

## 2016-04-13 MED ORDER — FENTANYL CITRATE (PF) 100 MCG/2ML IJ SOLN
INTRAMUSCULAR | Status: AC
  Filled 2016-04-13: qty 2

## 2016-04-13 MED ORDER — LACTATED RINGERS IV SOLN
INTRAVENOUS | Status: DC
  Administered 2016-04-13: 12:00:00 via INTRAVENOUS

## 2016-04-13 MED ORDER — MIDAZOLAM HCL 2 MG/2ML IJ SOLN
1.0000 mg | INTRAMUSCULAR | Status: DC | PRN
  Administered 2016-04-13: 2 mg via INTRAVENOUS

## 2016-04-13 MED ORDER — LIDOCAINE HCL 3.5 % OP GEL
1.0000 "application " | Freq: Once | OPHTHALMIC | Status: AC
  Administered 2016-04-13: 1 via OPHTHALMIC

## 2016-04-13 MED ORDER — PROVISC 10 MG/ML IO SOLN
INTRAOCULAR | Status: DC | PRN
  Administered 2016-04-13: 0.85 mL via INTRAOCULAR

## 2016-04-13 MED ORDER — LIDOCAINE HCL (PF) 1 % IJ SOLN
INTRAMUSCULAR | Status: DC | PRN
  Administered 2016-04-13: .5 mL

## 2016-04-13 MED ORDER — BSS IO SOLN
INTRAOCULAR | Status: DC | PRN
  Administered 2016-04-13: 15 mL

## 2016-04-13 MED ORDER — EPINEPHRINE PF 1 MG/ML IJ SOLN
INTRAOCULAR | Status: DC | PRN
  Administered 2016-04-13: 500 mL

## 2016-04-13 MED ORDER — PHENYLEPHRINE HCL 2.5 % OP SOLN
1.0000 [drp] | OPHTHALMIC | Status: AC
  Administered 2016-04-13 (×3): 1 [drp] via OPHTHALMIC

## 2016-04-13 MED ORDER — TETRACAINE HCL 0.5 % OP SOLN
1.0000 [drp] | OPHTHALMIC | Status: AC
  Administered 2016-04-13 (×3): 1 [drp] via OPHTHALMIC

## 2016-04-13 SURGICAL SUPPLY — 11 items
CLOTH BEACON ORANGE TIMEOUT ST (SAFETY) ×2 IMPLANT
EYE SHIELD UNIVERSAL CLEAR (GAUZE/BANDAGES/DRESSINGS) ×2 IMPLANT
GLOVE BIOGEL PI IND STRL 7.0 (GLOVE) ×1 IMPLANT
GLOVE BIOGEL PI INDICATOR 7.0 (GLOVE) ×1
GLOVE EXAM NITRILE MD LF STRL (GLOVE) ×2 IMPLANT
PAD ARMBOARD 7.5X6 YLW CONV (MISCELLANEOUS) ×2 IMPLANT
SIGHTPATH CAT PROC W REG LENS (Ophthalmic Related) ×2 IMPLANT
SYRINGE LUER LOK 1CC (MISCELLANEOUS) ×2 IMPLANT
TAPE SURG TRANSPORE 1 IN (GAUZE/BANDAGES/DRESSINGS) ×1 IMPLANT
TAPE SURGICAL TRANSPORE 1 IN (GAUZE/BANDAGES/DRESSINGS) ×1
WATER STERILE IRR 250ML POUR (IV SOLUTION) ×2 IMPLANT

## 2016-04-13 NOTE — Transfer of Care (Signed)
Immediate Anesthesia Transfer of Care Note  Patient: [name]  Procedure(s) Performed: Procedure(s) with comments: CATARACT EXTRACTION PHACO AND INTRAOCULAR LENS PLACEMENT (IOC) (Left) - CDE: 8.08  Patient Location: Short Stay  Anesthesia Type:MAC  Level of Consciousness: awake, alert , oriented and patient cooperative  Airway & Oxygen Therapy: Patient Spontanous Breathing  Post-op Assessment: Report given to RN, Post -op Vital signs reviewed and stable and Patient moving all extremities  Post vital signs: Reviewed and stable  Last Vitals:  Vitals:   04/13/16 1315 04/13/16 1320  BP: 128/70 121/74  Pulse:    Resp: 12 10  Temp:      Last Pain:  Vitals:   04/13/16 1152  TempSrc: Oral      Patients Stated Pain Goal: 5 (16/57/90 3833)  Complications: No apparent anesthesia complications

## 2016-04-13 NOTE — Anesthesia Preprocedure Evaluation (Signed)
Anesthesia Evaluation  Patient identified by MRN, date of birth, ID band Patient awake    Reviewed: Allergy & Precautions, NPO status , Patient's Chart, lab work & pertinent test results  Airway Mallampati: I  TM Distance: >3 FB     Dental  (+) Edentulous Upper, Partial Lower   Pulmonary former smoker,    breath sounds clear to auscultation       Cardiovascular negative cardio ROS   Rhythm:Regular Rate:Normal     Neuro/Psych    GI/Hepatic GERD  ,  Endo/Other    Renal/GU Renal InsufficiencyRenal disease     Musculoskeletal   Abdominal   Peds  Hematology   Anesthesia Other Findings   Reproductive/Obstetrics                             Anesthesia Physical Anesthesia Plan  ASA: III  Anesthesia Plan: MAC   Post-op Pain Management:    Induction:   Airway Management Planned: Nasal Cannula  Additional Equipment:   Intra-op Plan:   Post-operative Plan:   Informed Consent: I have reviewed the patients History and Physical, chart, labs and discussed the procedure including the risks, benefits and alternatives for the proposed anesthesia with the patient or authorized representative who has indicated his/her understanding and acceptance.     Plan Discussed with:   Anesthesia Plan Comments:         Anesthesia Quick Evaluation

## 2016-04-13 NOTE — Op Note (Signed)
Date of Admission: 04/13/2016  Date of Surgery: 04/13/2016   Pre-Op Dx: Cataract Left Eye  Post-Op Dx: Senile Combined Cataract Left  Eye,  Dx Code E33.295  Surgeon: Tonny Branch, M.D.  Assistants: None  Anesthesia: Topical with MAC  Indications: Painless, progressive loss of vision with compromise of daily activities.  Surgery: Cataract Extraction with Intraocular lens Implant Left Eye  Discription: The patient had dilating drops and viscous lidocaine placed into the Left eye in the pre-op holding area. After transfer to the operating room, a time out was performed. The patient was then prepped and draped. Beginning with a 30 degree blade a paracentesis port was made at the surgeon's 2 o'clock position. The anterior chamber was then filled with 1% non-preserved lidocaine. This was followed by filling the anterior chamber with Provisc.  A 2.53mm keratome blade was used to make a clear corneal incision at the temporal limbus.  A bent cystatome needle was used to create a continuous tear capsulotomy. Hydrodissection was performed with balanced salt solution on a Fine canula. The lens nucleus was then removed using the phacoemulsification handpiece. Residual cortex was removed with the I&A handpiece. The anterior chamber and capsular bag were refilled with Provisc. A posterior chamber intraocular lens was placed into the capsular bag with it's injector. The implant was positioned with the Kuglan hook. The Provisc was then removed from the anterior chamber and capsular bag with the I&A handpiece. Stromal hydration of the main incision and paracentesis port was performed with BSS on a Fine canula. The wounds were tested for leak which was negative. The patient tolerated the procedure well. There were no operative complications. The patient was then transferred to the recovery room in stable condition.  Complications: None  Specimen: None  EBL: None  Prosthetic device: Hoya iSert 250, power 18.0 D,  SN [id number].

## 2016-04-13 NOTE — H&P (Signed)
I have reviewed the H&P, the patient was re-examined, and I have identified no interval changes in medical condition and plan of care since the history and physical of record  

## 2016-04-13 NOTE — Anesthesia Postprocedure Evaluation (Signed)
Anesthesia Post Note  Patient: [name]  Procedure(s) Performed: Procedure(s) (LRB): CATARACT EXTRACTION PHACO AND INTRAOCULAR LENS PLACEMENT (IOC) (Left)  Patient location during evaluation: Short Stay Anesthesia Type: MAC Level of consciousness: awake and alert, oriented and patient cooperative Pain management: pain level controlled Vital Signs Assessment: post-procedure vital signs reviewed and stable Respiratory status: spontaneous breathing, nonlabored ventilation and respiratory function stable Cardiovascular status: blood pressure returned to baseline Postop Assessment: no signs of nausea or vomiting Anesthetic complications: no    Last Vitals:  Vitals:   04/13/16 1315 04/13/16 1320  BP: 128/70 121/74  Pulse:    Resp: 12 10  Temp:      Last Pain:  Vitals:   04/13/16 1152  TempSrc: Oral                 Cloys Vera J

## 2016-04-13 NOTE — Discharge Instructions (Signed)

## 2016-04-16 ENCOUNTER — Encounter (HOSPITAL_COMMUNITY): Payer: Self-pay | Admitting: Ophthalmology

## 2016-04-20 DIAGNOSIS — H2511 Age-related nuclear cataract, right eye: Secondary | ICD-10-CM | POA: Diagnosis not present

## 2016-06-30 DIAGNOSIS — N183 Chronic kidney disease, stage 3 (moderate): Secondary | ICD-10-CM | POA: Diagnosis not present

## 2016-06-30 DIAGNOSIS — N400 Enlarged prostate without lower urinary tract symptoms: Secondary | ICD-10-CM | POA: Diagnosis not present

## 2016-06-30 DIAGNOSIS — Z681 Body mass index (BMI) 19 or less, adult: Secondary | ICD-10-CM | POA: Diagnosis not present

## 2016-06-30 DIAGNOSIS — Z1389 Encounter for screening for other disorder: Secondary | ICD-10-CM | POA: Diagnosis not present

## 2016-09-01 ENCOUNTER — Ambulatory Visit: Payer: Medicare Other | Admitting: Urology

## 2016-09-16 ENCOUNTER — Ambulatory Visit (INDEPENDENT_AMBULATORY_CARE_PROVIDER_SITE_OTHER): Payer: Medicare Other | Admitting: Urology

## 2016-09-16 DIAGNOSIS — N401 Enlarged prostate with lower urinary tract symptoms: Secondary | ICD-10-CM | POA: Diagnosis not present

## 2016-09-16 DIAGNOSIS — R351 Nocturia: Secondary | ICD-10-CM

## 2016-09-17 DIAGNOSIS — S2232XA Fracture of one rib, left side, initial encounter for closed fracture: Secondary | ICD-10-CM | POA: Diagnosis not present

## 2016-09-17 DIAGNOSIS — R0781 Pleurodynia: Secondary | ICD-10-CM | POA: Diagnosis not present

## 2016-09-17 DIAGNOSIS — S50312A Abrasion of left elbow, initial encounter: Secondary | ICD-10-CM | POA: Diagnosis not present

## 2016-09-23 ENCOUNTER — Encounter (HOSPITAL_COMMUNITY): Payer: Self-pay | Admitting: Emergency Medicine

## 2016-09-23 ENCOUNTER — Emergency Department (HOSPITAL_COMMUNITY): Payer: Medicare Other

## 2016-09-23 ENCOUNTER — Emergency Department (HOSPITAL_COMMUNITY)
Admission: EM | Admit: 2016-09-23 | Discharge: 2016-09-24 | Disposition: A | Discharge status: Home / Self Care | Payer: Medicare Other | Attending: Emergency Medicine | Admitting: Emergency Medicine

## 2016-09-23 DIAGNOSIS — R0602 Shortness of breath: Secondary | ICD-10-CM | POA: Diagnosis not present

## 2016-09-23 DIAGNOSIS — Z87891 Personal history of nicotine dependence: Secondary | ICD-10-CM | POA: Insufficient documentation

## 2016-09-23 DIAGNOSIS — R0789 Other chest pain: Secondary | ICD-10-CM | POA: Insufficient documentation

## 2016-09-23 DIAGNOSIS — R0781 Pleurodynia: Secondary | ICD-10-CM | POA: Diagnosis not present

## 2016-09-23 DIAGNOSIS — R079 Chest pain, unspecified: Secondary | ICD-10-CM | POA: Diagnosis not present

## 2016-09-23 DIAGNOSIS — S3991XA Unspecified injury of abdomen, initial encounter: Secondary | ICD-10-CM | POA: Diagnosis not present

## 2016-09-23 DIAGNOSIS — K2980 Duodenitis without bleeding: Secondary | ICD-10-CM | POA: Diagnosis not present

## 2016-09-23 DIAGNOSIS — R1084 Generalized abdominal pain: Secondary | ICD-10-CM | POA: Diagnosis present

## 2016-09-23 LAB — HEPATIC FUNCTION PANEL
ALT: 11 U/L — ABNORMAL LOW (ref 17–63)
AST: 13 U/L — ABNORMAL LOW (ref 15–41)
Albumin: 4.1 g/dL (ref 3.5–5.0)
Alkaline Phosphatase: 76 U/L (ref 38–126)
Bilirubin, Direct: 0.3 mg/dL (ref 0.1–0.5)
Indirect Bilirubin: 1.4 mg/dL — ABNORMAL HIGH (ref 0.3–0.9)
Total Bilirubin: 1.7 mg/dL — ABNORMAL HIGH (ref 0.3–1.2)
Total Protein: 7.7 g/dL (ref 6.5–8.1)

## 2016-09-23 LAB — BASIC METABOLIC PANEL
Anion gap: 11 (ref 5–15)
BUN: 23 mg/dL — ABNORMAL HIGH (ref 6–20)
CO2: 22 mmol/L (ref 22–32)
Calcium: 9.9 mg/dL (ref 8.9–10.3)
Chloride: 102 mmol/L (ref 101–111)
Creatinine, Ser: 1.58 mg/dL — ABNORMAL HIGH (ref 0.61–1.24)
GFR calc Af Amer: 45 mL/min — ABNORMAL LOW (ref >60)
GFR calc non Af Amer: 38 mL/min — ABNORMAL LOW (ref >60)
Glucose, Bld: 111 mg/dL — ABNORMAL HIGH (ref 65–99)
Potassium: 4.2 mmol/L (ref 3.5–5.1)
Sodium: 135 mmol/L (ref 135–145)

## 2016-09-23 LAB — CBC
HCT: 35.4 % — ABNORMAL LOW (ref 39.0–52.0)
Hemoglobin: 11.9 g/dL — ABNORMAL LOW (ref 13.0–17.0)
MCH: 27.9 pg (ref 26.0–34.0)
MCHC: 33.6 g/dL (ref 30.0–36.0)
MCV: 82.9 fL (ref 78.0–100.0)
Platelets: 162 10*3/uL (ref 150–400)
RBC: 4.27 MIL/uL (ref 4.22–5.81)
RDW: 19.3 % — ABNORMAL HIGH (ref 11.5–15.5)
WBC: 4.9 10*3/uL (ref 4.0–10.5)

## 2016-09-23 LAB — I-STAT TROPONIN, ED: Troponin i, poc: 0 ng/mL (ref 0.00–0.08)

## 2016-09-23 LAB — LIPASE, BLOOD: Lipase: 29 U/L (ref 11–51)

## 2016-09-23 MED ORDER — SODIUM CHLORIDE 0.9 % IV BOLUS (SEPSIS)
1000.0000 mL | Freq: Once | INTRAVENOUS | Status: AC
  Administered 2016-09-23: 1000 mL via INTRAVENOUS

## 2016-09-23 MED ORDER — ONDANSETRON HCL 4 MG/2ML IJ SOLN
4.0000 mg | Freq: Once | INTRAMUSCULAR | Status: AC
  Administered 2016-09-23: 4 mg via INTRAVENOUS
  Filled 2016-09-23: qty 2

## 2016-09-23 MED ORDER — MORPHINE SULFATE (PF) 4 MG/ML IV SOLN
4.0000 mg | Freq: Once | INTRAVENOUS | Status: AC
Start: 2016-09-23 — End: 2016-09-23
  Administered 2016-09-23: 4 mg via INTRAVENOUS
  Filled 2016-09-23: qty 1

## 2016-09-23 MED ORDER — IOPAMIDOL (ISOVUE-370) INJECTION 76%
75.0000 mL | Freq: Once | INTRAVENOUS | Status: AC | PRN
  Administered 2016-09-23: 75 mL via INTRAVENOUS

## 2016-09-23 NOTE — ED Triage Notes (Signed)
Pt c/o increased sob and chest pain. Pt states he fell and broke some ribs on the left side last week.

## 2016-09-23 NOTE — ED Notes (Signed)
Patient transported to CT 

## 2016-09-23 NOTE — ED Notes (Signed)
ED Provider at bedside. 

## 2016-09-23 NOTE — ED Provider Notes (Signed)
[facility] DEPT Provider Note   CSN: [id number] Arrival date & time: 09/23/16  2157  By signing my name below, I, Margit Banda, attest that this documentation has been prepared under the direction and in the presence of Sherwood Gambler, MD. Electronically Signed: Margit Banda, ED Scribe. 09/23/16. 11:19 PM.  History   Chief Complaint Chief Complaint  Patient presents with  . Shortness of Breath    HPI [name] is a 83 y.o. male who presents to the Emergency Department complaining of gradually worsening abdominal pain that started a few days ago. Associated sx include SOB, left sided rib pain, appetite change, nausea, dry heaving and decreased urine. Pt reports falling ~ a week ago and breaking two left ribs. He was given tramadol for pain. He reports having no more pain medication and that his pain has worsened. Pt denies vomiting, cough, fever, diarrhea, constipation, back pain, bilateral leg edema, dysuria, and hematuria.   The history is provided by the patient. No language interpreter was used.    Past Medical History:  Diagnosis Date  . Chronic kidney insufficiency     Patient Active Problem List   Diagnosis Date Noted  . GERD (gastroesophageal reflux disease) 10/01/2015    Past Surgical History:  Procedure Laterality Date  . CATARACT EXTRACTION W/PHACO Right 03/09/2016   Procedure: CATARACT EXTRACTION PHACO AND INTRAOCULAR LENS PLACEMENT RIGHT EYE CDE=8.58;  Surgeon: Tonny Branch, MD;  Location: [facility];  Service: Ophthalmology;  Laterality: Right;  right  . CATARACT EXTRACTION W/PHACO Left 04/13/2016   Procedure: CATARACT EXTRACTION PHACO AND INTRAOCULAR LENS PLACEMENT (IOC);  Surgeon: Tonny Branch, MD;  Location: [facility];  Service: Ophthalmology;  Laterality: Left;  CDE: 8.08  . None to Date  10/01/15       Home Medications    Prior to Admission medications   Not on File    Family History Family History  Problem Relation Age of Onset  . Kidney  disease Mother   . Kidney disease Sister   . Colon cancer Neg Hx     Social History Social History  Substance Use Topics  . Smoking status: Former Smoker    Years: 15.00    Types: Cigarettes, Cigars    Quit date: 10/01/1990  . Smokeless tobacco: Never Used     Comment: Quit x 25-30 years; 2 cigars daily when smoked  . Alcohol use 0.0 oz/week     Comment: Glass of wine 2-3 times a week     Allergies   Patient has no known allergies.   Review of Systems Review of Systems  Constitutional: Positive for appetite change. Negative for fever.  Respiratory: Positive for shortness of breath. Negative for cough.   Cardiovascular: Positive for chest pain. Negative for leg swelling.  Gastrointestinal: Positive for abdominal pain and nausea. Negative for constipation, diarrhea and vomiting.  Genitourinary: Positive for decreased urine volume. Negative for dysuria and hematuria.  Musculoskeletal: Negative for back pain.  All other systems reviewed and are negative.    Physical Exam Updated Vital Signs BP 124/79   Pulse 74   Temp 98 F (36.7 C)   Resp 18   Ht 6' (1.829 m)   Wt 150 lb (68 kg)   SpO2 95%   BMI 20.34 kg/m   Physical Exam  Constitutional: He is oriented to person, place, and time. He appears well-developed and well-nourished.  HENT:  Head: Normocephalic and atraumatic.  Right Ear: External ear normal.  Left Ear: External ear normal.  Nose:  Nose normal.  Eyes: Right eye exhibits no discharge. Left eye exhibits no discharge.  Neck: Neck supple.  Cardiovascular: Normal rate, regular rhythm and normal heart sounds.   Pulmonary/Chest: Breath sounds normal. Tachypnea noted. He has no wheezes. He exhibits tenderness.  Lower left mid axillary chest wall tenderness.  Abdominal: Soft. There is tenderness.  Diffuse abdominal tenderness.  Musculoskeletal: He exhibits no edema.  Neurological: He is alert and oriented to person, place, and time.  Skin: Skin is warm and  dry. He is not diaphoretic.  Nursing note and vitals reviewed.    ED Treatments / Results  DIAGNOSTIC STUDIES: Oxygen Saturation is 98% on RA, normal by my interpretation.   COORDINATION OF CARE: 11:19 PM-Discussed next steps with pt. Pt verbalized understanding and is agreeable with the plan.    Labs (all labs ordered are listed, but only abnormal results are displayed) Labs Reviewed  BASIC METABOLIC PANEL - Abnormal; Notable for the following:       Result Value   Glucose, Bld 111 (*)    BUN 23 (*)    Creatinine, Ser 1.58 (*)    GFR calc non Af Amer 38 (*)    GFR calc Af Amer 45 (*)    All other components within normal limits  CBC - Abnormal; Notable for the following:    Hemoglobin 11.9 (*)    HCT 35.4 (*)    RDW 19.3 (*)    All other components within normal limits  HEPATIC FUNCTION PANEL - Abnormal; Notable for the following:    AST 13 (*)    ALT 11 (*)    Total Bilirubin 1.7 (*)    Indirect Bilirubin 1.4 (*)    All other components within normal limits  LIPASE, BLOOD  URINALYSIS, ROUTINE W REFLEX MICROSCOPIC  I-STAT TROPOININ, ED    EKG  EKG Interpretation  Date/Time:  Wednesday Sep 23 2016 22:10:52 EDT Ventricular Rate:  74 PR Interval:    QRS Duration: 98 QT Interval:  374 QTC Calculation: 415 R Axis:   78 Text Interpretation:  Sinus rhythm no acute ST/T changes no significant change compared to Oct 2017 Confirmed by Sherwood Gambler [phone]) on 09/23/2016 10:56:52 PM       Radiology Ct Angio Chest Pe W Or Wo Contrast  Result Date: 09/24/2016 CLINICAL DATA:  Acute onset of worsening generalized abdominal pain. Shortness of breath and left-sided rib pain. Change in appetite. Nausea and dry heaves. Decreased urinary output. Recent fall, with left-sided rib fractures. Insert additional EXAM: CT ANGIOGRAPHY CHEST CT ABDOMEN AND PELVIS WITH CONTRAST TECHNIQUE: Multidetector CT imaging of the chest was performed using the standard protocol during bolus  administration of intravenous contrast. Multiplanar CT image reconstructions and MIPs were obtained to evaluate the vascular anatomy. Multidetector CT imaging of the abdomen and pelvis was performed using the standard protocol during bolus administration of intravenous contrast. CONTRAST:  75 mL of Isovue 370 IV contrast COMPARISON:  Chest radiograph performed earlier today at 10:22 p.m., and CTA of the chest performed 09/21/2005 FINDINGS: CTA CHEST FINDINGS Cardiovascular:  There is no evidence of pulmonary embolus. Scattered coronary artery calcifications are seen. Mild prominence of the right main pulmonary artery may reflect pulmonary arterial hypertension. The heart remains normal in size. Scattered calcification is noted along the aortic arch and proximal great vessels. Mediastinum/Nodes: The mediastinum is otherwise unremarkable in appearance. No mediastinal lymphadenopathy is seen. Trace pericardial fluid remains within normal limits. The thyroid gland is unremarkable. No axillary lymphadenopathy is seen.  Lungs/Pleura: Scarring is noted at the lung apices. Bibasilar scarring is also noted. Mild underlying emphysema is noted. No pleural effusion or pneumothorax is seen. No dominant mass is identified. Minimal opacification of bronchioles to the right lower lobe may reflect mild aspiration. Would correlate with the patient's symptoms. Musculoskeletal: No acute osseous abnormalities are identified. There are chronic compression deformities of vertebral bodies T6, T7, T8 and T9. The visualized musculature is unremarkable in appearance. Review of the MIP images confirms the above findings. CT ABDOMEN and PELVIS FINDINGS Hepatobiliary: The liver is grossly unremarkable in appearance. The gallbladder is unremarkable. The common bile duct remains normal in caliber. Pancreas: The pancreatic head is slightly prominent, but this may remain within normal limits. There is vague soft tissue inflammation about the first  and second segments of the duodenum, which may reflect a mild infectious or inflammatory duodenitis. Would correlate with pancreatic lab values, to exclude underlying pancreatitis. Spleen: The spleen is unremarkable in appearance. Adrenals/Urinary Tract: The adrenal glands are unremarkable in appearance. Scattered bilateral renal cysts are noted. Mild bilateral renal scarring is noted. There is no evidence of hydronephrosis. No renal or ureteral stones are identified. Stomach/Bowel: The stomach is unremarkable in appearance. The small bowel is within normal limits. The appendix is not visualized; there is no evidence for appendicitis. Scattered diverticulosis is noted along the sigmoid colon, without evidence of diverticulitis. Vascular/Lymphatic: Scattered calcification is seen along the abdominal aorta and its branches. The abdominal aorta is otherwise grossly unremarkable. The inferior vena cava is grossly unremarkable. No retroperitoneal lymphadenopathy is seen. No pelvic sidewall lymphadenopathy is identified. Reproductive: The bladder is mildly distended and grossly unremarkable. The prostate is borderline normal in size, with scattered calcification. Other: No additional soft tissue abnormalities are seen. Musculoskeletal: No acute osseous abnormalities are identified. There is mild chronic loss of height at L2-L5, with vacuum phenomenon at L5-S1. The visualized musculature is unremarkable in appearance. Review of the MIP images confirms the above findings. IMPRESSION: 1. No evidence of pulmonary embolus. 2. Minimal opacification of bronchioles to the right lower lung lobe may reflect mild aspiration. No associated airspace opacification seen to suggest pneumonia at this time. 3. Vague soft tissue inflammation about the first and second segments of the duodenum, which may reflect a mild infectious or inflammatory duodenitis. The pancreatic head is slightly prominent, but this may remain within normal limits.  Would correlate with pancreatic lab values, to help exclude underlying pancreatitis. 4. Mild prominence of the right main pulmonary artery may reflect pulmonary arterial hypertension. 5. Scattered coronary artery calcifications seen. 6. Scarring at the lung apices and lung bases. Mild bilateral emphysema noted. 7. Scattered bilateral renal cysts. Mild bilateral renal scarring noted. 8. Scattered diverticulosis along the sigmoid colon, without evidence of diverticulitis. 9. Scattered aortic atherosclerosis. 10. Chronic compression deformities of vertebral bodies T6-T9, and L2-L5. Electronically Signed   By: Garald Balding M.D.   On: 09/24/2016 00:35   Ct Abdomen Pelvis W Contrast  Result Date: 09/24/2016 CLINICAL DATA:  Acute onset of worsening generalized abdominal pain. Shortness of breath and left-sided rib pain. Change in appetite. Nausea and dry heaves. Decreased urinary output. Recent fall, with left-sided rib fractures. Insert additional EXAM: CT ANGIOGRAPHY CHEST CT ABDOMEN AND PELVIS WITH CONTRAST TECHNIQUE: Multidetector CT imaging of the chest was performed using the standard protocol during bolus administration of intravenous contrast. Multiplanar CT image reconstructions and MIPs were obtained to evaluate the vascular anatomy. Multidetector CT imaging of the abdomen and pelvis was performed using  the standard protocol during bolus administration of intravenous contrast. CONTRAST:  75 mL of Isovue 370 IV contrast COMPARISON:  Chest radiograph performed earlier today at 10:22 p.m., and CTA of the chest performed 09/21/2005 FINDINGS: CTA CHEST FINDINGS Cardiovascular:  There is no evidence of pulmonary embolus. Scattered coronary artery calcifications are seen. Mild prominence of the right main pulmonary artery may reflect pulmonary arterial hypertension. The heart remains normal in size. Scattered calcification is noted along the aortic arch and proximal great vessels. Mediastinum/Nodes: The  mediastinum is otherwise unremarkable in appearance. No mediastinal lymphadenopathy is seen. Trace pericardial fluid remains within normal limits. The thyroid gland is unremarkable. No axillary lymphadenopathy is seen. Lungs/Pleura: Scarring is noted at the lung apices. Bibasilar scarring is also noted. Mild underlying emphysema is noted. No pleural effusion or pneumothorax is seen. No dominant mass is identified. Minimal opacification of bronchioles to the right lower lobe may reflect mild aspiration. Would correlate with the patient's symptoms. Musculoskeletal: No acute osseous abnormalities are identified. There are chronic compression deformities of vertebral bodies T6, T7, T8 and T9. The visualized musculature is unremarkable in appearance. Review of the MIP images confirms the above findings. CT ABDOMEN and PELVIS FINDINGS Hepatobiliary: The liver is grossly unremarkable in appearance. The gallbladder is unremarkable. The common bile duct remains normal in caliber. Pancreas: The pancreatic head is slightly prominent, but this may remain within normal limits. There is vague soft tissue inflammation about the first and second segments of the duodenum, which may reflect a mild infectious or inflammatory duodenitis. Would correlate with pancreatic lab values, to exclude underlying pancreatitis. Spleen: The spleen is unremarkable in appearance. Adrenals/Urinary Tract: The adrenal glands are unremarkable in appearance. Scattered bilateral renal cysts are noted. Mild bilateral renal scarring is noted. There is no evidence of hydronephrosis. No renal or ureteral stones are identified. Stomach/Bowel: The stomach is unremarkable in appearance. The small bowel is within normal limits. The appendix is not visualized; there is no evidence for appendicitis. Scattered diverticulosis is noted along the sigmoid colon, without evidence of diverticulitis. Vascular/Lymphatic: Scattered calcification is seen along the abdominal  aorta and its branches. The abdominal aorta is otherwise grossly unremarkable. The inferior vena cava is grossly unremarkable. No retroperitoneal lymphadenopathy is seen. No pelvic sidewall lymphadenopathy is identified. Reproductive: The bladder is mildly distended and grossly unremarkable. The prostate is borderline normal in size, with scattered calcification. Other: No additional soft tissue abnormalities are seen. Musculoskeletal: No acute osseous abnormalities are identified. There is mild chronic loss of height at L2-L5, with vacuum phenomenon at L5-S1. The visualized musculature is unremarkable in appearance. Review of the MIP images confirms the above findings. IMPRESSION: 1. No evidence of pulmonary embolus. 2. Minimal opacification of bronchioles to the right lower lung lobe may reflect mild aspiration. No associated airspace opacification seen to suggest pneumonia at this time. 3. Vague soft tissue inflammation about the first and second segments of the duodenum, which may reflect a mild infectious or inflammatory duodenitis. The pancreatic head is slightly prominent, but this may remain within normal limits. Would correlate with pancreatic lab values, to help exclude underlying pancreatitis. 4. Mild prominence of the right main pulmonary artery may reflect pulmonary arterial hypertension. 5. Scattered coronary artery calcifications seen. 6. Scarring at the lung apices and lung bases. Mild bilateral emphysema noted. 7. Scattered bilateral renal cysts. Mild bilateral renal scarring noted. 8. Scattered diverticulosis along the sigmoid colon, without evidence of diverticulitis. 9. Scattered aortic atherosclerosis. 10. Chronic compression deformities of vertebral bodies T6-T9,  and L2-L5. Electronically Signed   By: Garald Balding M.D.   On: 09/24/2016 00:35   Dg Chest Portable 1 View  Result Date: 09/23/2016 CLINICAL DATA:  Progressive chest pain and shortness of breath. Patient reports fall with broken  ribs last week. EXAM: PORTABLE CHEST 1 VIEW COMPARISON:  09/05/2015 FINDINGS: The lungs are hyperinflated, unchanged from prior exam. Scattered bibasilar scarring/atelectasis. No consolidation, evidence of pleural fluid or pneumothorax. Normal heart size and mediastinal contours. Patient's reported rib fractures are not well visualized radiographically. IMPRESSION: Stable hyperinflation. No evidence of superimposed acute abnormality. Electronically Signed   By: Jeb Levering M.D.   On: 09/23/2016 22:38    Procedures Procedures (including critical care time)  Medications Ordered in ED Medications  sodium chloride 0.9 % bolus 1,000 mL (0 mLs Intravenous Stopped 09/24/16 0320)  morphine 4 MG/ML injection 4 mg (4 mg Intravenous Given 09/23/16 2329)  ondansetron (ZOFRAN) injection 4 mg (4 mg Intravenous Given 09/23/16 2328)  iopamidol (ISOVUE-370) 76 % injection 75 mL (75 mLs Intravenous Contrast Given 09/23/16 2347)  famotidine (PEPCID) IVPB 20 mg premix (0 mg Intravenous Stopped 09/24/16 0235)  pantoprazole (PROTONIX) injection 40 mg (40 mg Intravenous Given 09/24/16 0200)  HYDROcodone-acetaminophen (NORCO/VICODIN) 5-325 MG per tablet 1 tablet (1 tablet Oral Given 09/24/16 0200)     Initial Impression / Assessment and Plan / ED Course  I have reviewed the triage vital signs and the nursing notes.  Pertinent labs & imaging results that were available during my care of the patient were reviewed by me and considered in my medical decision making (see chart for details).     Unclear why patient is short of breath and tachypnea but I believe this is related to the pain. When his pain was better controlled with IV morphine his shortness of breath and work of breathing significantly improved. There is no significant or obvious pneumonia or PE on CT scan. No known rib fractures. As for his abdominal pain this appears to be coming from a duodenitis. He denies hematemesis or melena. His pain is now  significantly better. He will be given PPI and H2 blocker in the ED and discharged with these medications as well as hydrocodone for breakthrough pain. I do not feel he needs to be admitted given his significant improvement and no signs of bleeding. However I have discussed strict return precautions and need for both PCP and GI follow-up.  Final Clinical Impressions(s) / ED Diagnoses   Final diagnoses:  Left sided chest pain  Duodenitis    New Prescriptions There are no discharge medications for this patient.   I personally performed the services described in this documentation, which was scribed in my presence. The recorded information has been reviewed and is accurate.     Sherwood Gambler, MD 09/24/16 [phone]

## 2016-09-24 DIAGNOSIS — K2980 Duodenitis without bleeding: Secondary | ICD-10-CM | POA: Diagnosis not present

## 2016-09-24 DIAGNOSIS — S3991XA Unspecified injury of abdomen, initial encounter: Secondary | ICD-10-CM | POA: Diagnosis not present

## 2016-09-24 DIAGNOSIS — R1084 Generalized abdominal pain: Secondary | ICD-10-CM | POA: Diagnosis not present

## 2016-09-24 DIAGNOSIS — R0602 Shortness of breath: Secondary | ICD-10-CM | POA: Diagnosis not present

## 2016-09-24 MED ORDER — PANTOPRAZOLE SODIUM 40 MG IV SOLR
INTRAVENOUS | Status: AC
  Administered 2016-09-24: 40 mg via INTRAVENOUS
  Filled 2016-09-24: qty 40

## 2016-09-24 MED ORDER — HYDROCODONE-ACETAMINOPHEN 5-325 MG PO TABS
1.0000 | ORAL_TABLET | Freq: Once | ORAL | Status: AC
  Administered 2016-09-24: 1 via ORAL

## 2016-09-24 MED ORDER — FAMOTIDINE IN NACL 20-0.9 MG/50ML-% IV SOLN
20.0000 mg | Freq: Once | INTRAVENOUS | Status: AC
  Administered 2016-09-24: 20 mg via INTRAVENOUS

## 2016-09-24 MED ORDER — HYDROCODONE-ACETAMINOPHEN 5-325 MG PO TABS
ORAL_TABLET | ORAL | Status: AC
  Administered 2016-09-24: 1 via ORAL
  Filled 2016-09-24: qty 1

## 2016-09-24 MED ORDER — PANTOPRAZOLE SODIUM 40 MG IV SOLR
40.0000 mg | Freq: Once | INTRAVENOUS | Status: AC
  Administered 2016-09-24: 40 mg via INTRAVENOUS

## 2016-09-24 MED ORDER — FAMOTIDINE IN NACL 20-0.9 MG/50ML-% IV SOLN
INTRAVENOUS | Status: AC
  Administered 2016-09-24: 20 mg via INTRAVENOUS
  Filled 2016-09-24: qty 50

## 2016-09-25 DIAGNOSIS — S2231XA Fracture of one rib, right side, initial encounter for closed fracture: Secondary | ICD-10-CM | POA: Diagnosis not present

## 2016-09-25 DIAGNOSIS — Z681 Body mass index (BMI) 19 or less, adult: Secondary | ICD-10-CM | POA: Diagnosis not present

## 2016-10-19 ENCOUNTER — Ambulatory Visit (HOSPITAL_COMMUNITY)
Admission: RE | Admit: 2016-10-19 | Discharge: 2016-10-19 | Disposition: A | Discharge status: Home / Self Care | Payer: Medicare Other | Source: Ambulatory Visit | Attending: Registered Nurse | Admitting: Registered Nurse

## 2016-10-19 ENCOUNTER — Other Ambulatory Visit (HOSPITAL_COMMUNITY): Payer: Self-pay | Admitting: Registered Nurse

## 2016-10-19 DIAGNOSIS — R51 Headache: Principal | ICD-10-CM

## 2016-10-19 DIAGNOSIS — Z681 Body mass index (BMI) 19 or less, adult: Secondary | ICD-10-CM | POA: Diagnosis not present

## 2016-10-19 DIAGNOSIS — R519 Headache, unspecified: Secondary | ICD-10-CM

## 2016-10-19 DIAGNOSIS — M25512 Pain in left shoulder: Secondary | ICD-10-CM | POA: Diagnosis not present

## 2016-10-19 DIAGNOSIS — K219 Gastro-esophageal reflux disease without esophagitis: Secondary | ICD-10-CM | POA: Diagnosis not present

## 2016-10-19 DIAGNOSIS — N401 Enlarged prostate with lower urinary tract symptoms: Secondary | ICD-10-CM | POA: Diagnosis not present

## 2016-10-21 ENCOUNTER — Encounter: Payer: Self-pay | Admitting: Nurse Practitioner

## 2016-10-21 DIAGNOSIS — R634 Abnormal weight loss: Secondary | ICD-10-CM | POA: Diagnosis not present

## 2016-10-21 DIAGNOSIS — N400 Enlarged prostate without lower urinary tract symptoms: Secondary | ICD-10-CM | POA: Diagnosis not present

## 2016-10-21 DIAGNOSIS — D649 Anemia, unspecified: Secondary | ICD-10-CM | POA: Diagnosis not present

## 2016-10-21 DIAGNOSIS — Z681 Body mass index (BMI) 19 or less, adult: Secondary | ICD-10-CM | POA: Diagnosis not present

## 2016-10-21 DIAGNOSIS — M5412 Radiculopathy, cervical region: Secondary | ICD-10-CM | POA: Diagnosis not present

## 2016-10-21 DIAGNOSIS — K219 Gastro-esophageal reflux disease without esophagitis: Secondary | ICD-10-CM | POA: Diagnosis not present

## 2016-10-21 DIAGNOSIS — M47812 Spondylosis without myelopathy or radiculopathy, cervical region: Secondary | ICD-10-CM | POA: Diagnosis not present

## 2016-10-30 DIAGNOSIS — I6523 Occlusion and stenosis of bilateral carotid arteries: Secondary | ICD-10-CM | POA: Diagnosis not present

## 2016-10-30 DIAGNOSIS — M25612 Stiffness of left shoulder, not elsewhere classified: Secondary | ICD-10-CM | POA: Diagnosis not present

## 2016-10-30 DIAGNOSIS — M47812 Spondylosis without myelopathy or radiculopathy, cervical region: Secondary | ICD-10-CM | POA: Diagnosis not present

## 2016-10-30 DIAGNOSIS — M9981 Other biomechanical lesions of cervical region: Secondary | ICD-10-CM | POA: Diagnosis not present

## 2016-10-30 DIAGNOSIS — M542 Cervicalgia: Secondary | ICD-10-CM | POA: Diagnosis not present

## 2016-10-30 DIAGNOSIS — M25512 Pain in left shoulder: Secondary | ICD-10-CM | POA: Diagnosis not present

## 2016-11-02 ENCOUNTER — Telehealth: Payer: Self-pay

## 2016-11-02 ENCOUNTER — Ambulatory Visit (INDEPENDENT_AMBULATORY_CARE_PROVIDER_SITE_OTHER): Payer: Medicare Other | Admitting: Nurse Practitioner

## 2016-11-02 ENCOUNTER — Other Ambulatory Visit: Payer: Self-pay

## 2016-11-02 ENCOUNTER — Encounter: Payer: Self-pay | Admitting: Internal Medicine

## 2016-11-02 ENCOUNTER — Encounter: Payer: Self-pay | Admitting: Nurse Practitioner

## 2016-11-02 VITALS — BP 99/64 | HR 91 | Temp 98.0°F | Ht 76.0 in | Wt 131.6 lb

## 2016-11-02 DIAGNOSIS — K219 Gastro-esophageal reflux disease without esophagitis: Secondary | ICD-10-CM

## 2016-11-02 DIAGNOSIS — R634 Abnormal weight loss: Secondary | ICD-10-CM

## 2016-11-02 DIAGNOSIS — R935 Abnormal findings on diagnostic imaging of other abdominal regions, including retroperitoneum: Secondary | ICD-10-CM

## 2016-11-02 DIAGNOSIS — Z86010 Personal history of colonic polyps: Secondary | ICD-10-CM | POA: Diagnosis not present

## 2016-11-02 MED ORDER — NA SULFATE-K SULFATE-MG SULF 17.5-3.13-1.6 GM/177ML PO SOLN: 1.0000 | ORAL | 0 refills | Status: DC

## 2016-11-02 NOTE — Assessment & Plan Note (Signed)
The patient has noted a subjective weight loss, noted objective weight loss of approximately 12 pounds in the last year. He attributes his weight loss to his fall and shoulder pain. No obvious fracture noted, no acute illness. He is an active older adult and appears to be in good health in general. Also notes early satiety. We will further evaluate with colonoscopy and upper endoscopy as per below. Return for follow-up in 3 months.   If no significant findings, we can plan for gastric emptying study due to early satiety.

## 2016-11-02 NOTE — Progress Notes (Signed)
Referring Provider: Sharilyn Sites, MD Primary Care Physician:  Sharilyn Sites, MD Primary GI:  Dr. Gala Romney  Chief Complaint  Patient presents with  . Abdominal Pain    mid abd, resolved now  . early satiety  . Weight Loss    lost approx 10 lbs since beginning of May    HPI:   [name] is a 83 y.o. male who presents on referral from primary care for possible EGD for early satiety and abdominal pain. The patient was last seen in our office 12/04/2015 for GERD. Protonix previously ineffective, have been taking Tums or Rolaids. Omeprazole caused diarrhea. At his last visit he stated he was doing well with dietary changes, currently asymptomatic without medication. No other GI complaints. Recommend continue with dietary changes, return for follow-up as needed.  PCP notes reviewed, last saw primary care itching fourth 2018 at which point it was noted he has a history of GERD. Also noted history of diverticulosis and colon polyps. He had a recent fall with associated shoulder pain 2 weeks prior to his last primary care visit.  Last colonoscopy appears to been completed by Dr. Arnoldo Morale and surgery on 02/21/2008. Noted adequate bowel prep, no polyps seen, sigmoid diverticulosis found, no abnormal lesions. Recommended diverticulosis literature, repeat colonoscopy in 10 years. This would have him do in 2019 (next year).  CT of the abdomen and pelvis with contrast completed on 09/23/2016 found vague soft tissue inflammation about the first and second segments of the duodenum which may reflect a mild infectious or inflammatory duodenitis, pancreatic head slightly prominent possibly within normal limits but could correlate with labs to help exclude underlying pancreatitis. Scattered diverticulosis in the sigmoid colon without evidence of diverticulitis. Other non-GI findings.  Today he states he stopped taking pepcid and Protonix cause it "ties my stomach into knotts." having occasional  mid-abdominal pain, occurs 2-3 times a week, lasts 30-45 minutes, described as cramping; self-resolves. Denies GERD symptoms unless he eats something spicy. Has been losing weight which he thinks is due to his fall. Has never had EGD before. Also with early satiety with every meal. Denies dysphagia symptoms. Denies N/V, hematochezia, melena, fever, chills, acute changes in bowel habits. Denies dyspnea worse than baseline. Denies chest pain, dizziness, lightheadedness, syncope, near syncope. Denies any other upper or lower GI symptoms.  Not on any medications currently.  Past Medical History:  Diagnosis Date  . Chronic kidney insufficiency     Past Surgical History:  Procedure Laterality Date  . CATARACT EXTRACTION W/PHACO Right 03/09/2016   Procedure: CATARACT EXTRACTION PHACO AND INTRAOCULAR LENS PLACEMENT RIGHT EYE CDE=8.58;  Surgeon: Tonny Branch, MD;  Location: [facility];  Service: Ophthalmology;  Laterality: Right;  right  . CATARACT EXTRACTION W/PHACO Left 04/13/2016   Procedure: CATARACT EXTRACTION PHACO AND INTRAOCULAR LENS PLACEMENT (IOC);  Surgeon: Tonny Branch, MD;  Location: [facility];  Service: Ophthalmology;  Laterality: Left;  CDE: 8.08  . None to Date  10/01/15    No current outpatient prescriptions on file.   No current facility-administered medications for this visit.     Allergies as of 11/02/2016  . (No Known Allergies)    Family History  Problem Relation Age of Onset  . Kidney disease Mother   . Kidney disease Sister   . Colon cancer Neg Hx   . Gastric cancer Neg Hx   . Esophageal cancer Neg Hx     Social History   Social History  . Marital status: Widowed  Spouse name: N/A  . Number of children: N/A  . Years of education: N/A   Social History Main Topics  . Smoking status: Former Smoker    Years: 15.00    Types: Cigarettes, Cigars    Quit date: 10/01/1990  . Smokeless tobacco: Never Used     Comment: Quit x 25-30 years; 2 cigars daily when smoked  .  Alcohol use 0.0 oz/week     Comment: Glass of wine 1-2 times a week-"sometimes"  . Drug use: No  . Sexual activity: No   Other Topics Concern  . None   Social History Narrative  . None    Review of Systems: General: Negative for anorexia, weight loss, fever, chills, fatigue, weakness. ENT: Negative for hoarseness, difficulty swallowing. CV: Negative for chest pain, angina, palpitations, peripheral edema.  Respiratory: Negative for dyspnea at rest, cough, sputum, wheezing.  GI: See history of present illness. Endo: Negative for unusual weight change.  Heme: Negative for bruising or bleeding. Allergy: Negative for rash or hives.   Physical Exam: BP 99/64   Pulse 91   Temp 98 F (36.7 C) (Oral)   Ht 6\' 4"  (1.93 m)   Wt 131 lb 9.6 oz (59.7 kg)   BMI 16.02 kg/m  General:   Alert and oriented. Pleasant and cooperative. Well-nourished and well-developed.  Eyes:  Without icterus, sclera clear and conjunctiva pink.  Ears:  Normal auditory acuity. Cardiovascular:  S1, S2 present without murmurs appreciated. Extremities without clubbing or edema. Respiratory:  Clear to auscultation bilaterally. No wheezes, rales, or rhonchi. No distress.  Gastrointestinal:  +BS, soft, non-tender and non-distended. No HSM noted. No guarding or rebound. No masses appreciated.  Rectal:  Deferred  Musculoskalatal:  Symmetrical without gross deformities. Skin:  Intact without significant lesions or rashes. Neurologic:  Alert and oriented x4;  grossly normal neurologically. Psych:  Alert and cooperative. Normal mood and affect. Heme/Lymph/Immune: No excessive bruising noted.    11/02/2016 12:00 PM   Disclaimer: This note was dictated with voice recognition software. Similar sounding words can inadvertently be transcribed and may not be corrected upon review.

## 2016-11-02 NOTE — Assessment & Plan Note (Addendum)
CT abdomen completed in May of this year found mild inflammation of the first and second duodenum and some possibly normal variant prominence of the pancreatic head. At this point given the CT findings, weight loss, GERD history, colon polyps we will proceed with upper endoscopy to further evaluate. Differentials include mild gastroenteritis that time, duodenitis, less likely cancerous process. I will check labs including CBC, CMP, lipase. Return for follow-up in 3 months. Call if any significant or worsening  Proceed with EGD with Dr. Gala Romney in near future: the risks, benefits, and alternatives have been discussed with the patient in detail. The patient states understanding and desires to proceed.  The patient is not on any medications. Admits to 1-2 glasses of wine a week, no chronic or heavy alcohol use, denies drug use. Conscious sedation should be adequate for his procedure as it was for his last.

## 2016-11-02 NOTE — Telephone Encounter (Signed)
Pt was unable to schedule TCS/EGD with [facility] at [facility] this morning. Called pt. Procedure scheduled for 12/24/16 at 12:15pm. Instructions mailed to pt. Rx for prep sent to pharmacy. Orders entered for procedure.

## 2016-11-02 NOTE — Assessment & Plan Note (Signed)
Noted history of colon polyps. The patient's last colonoscopy was completed 9 years ago on conscious sedation with no findings. Recommended 10 year repeat colonoscopy. Given history of colon polyps and ongoing weight loss we will add a colonoscopy to his scheduled upper endoscopy as per below. Return for follow-up in 3 months. This will help accomplish his routine screening and may serve as a last colonoscopy he needs, barring any future symptoms.  Proceed with TCS with Dr. Gala Romney in near future: the risks, benefits, and alternatives have been discussed with the patient in detail. The patient states understanding and desires to proceed.  The patient is not on any medications. Admits to 1-2 glasses of wine a week, no chronic or heavy alcohol use, denies drug use. Conscious sedation should be adequate for his procedure as it was for his last.

## 2016-11-02 NOTE — Assessment & Plan Note (Signed)
He was previously on Protonix and Pepcid but had side effects and stopped them. Currently asymptomatic on dietary changes. Recommend he continue these changes, return for follow-up in 3 months.

## 2016-11-02 NOTE — Patient Instructions (Signed)
1. Continue your current medications. 2. Have your labs drawn when you're able to. 3. We will call you to schedule your colonoscopy and upper endoscopy to further evaluate your symptoms. 4. Return for follow-up in 3 months. 5. Call us if you have any worsening or severe symptoms.

## 2016-11-02 NOTE — Progress Notes (Signed)
cc'ed to pcp °

## 2016-11-09 ENCOUNTER — Telehealth: Payer: Self-pay | Admitting: Nurse Practitioner

## 2016-11-09 NOTE — Telephone Encounter (Signed)
PATIENT WANTED TO CANCEL HIS FOLLOW UP APT AND NOT RESCHEDULE. STATED FOLLOW UPS WERE NO GOOD.

## 2016-11-12 DIAGNOSIS — M2550 Pain in unspecified joint: Secondary | ICD-10-CM | POA: Diagnosis not present

## 2016-11-20 NOTE — Telephone Encounter (Signed)
Noted  

## 2016-11-23 ENCOUNTER — Emergency Department (HOSPITAL_COMMUNITY)
Admission: EM | Admit: 2016-11-23 | Discharge: 2016-11-23 | Disposition: A | Discharge status: Home / Self Care | Payer: Medicare Other | Attending: Emergency Medicine | Admitting: Emergency Medicine

## 2016-11-23 ENCOUNTER — Encounter (HOSPITAL_COMMUNITY): Payer: Self-pay | Admitting: Emergency Medicine

## 2016-11-23 ENCOUNTER — Emergency Department (HOSPITAL_COMMUNITY): Payer: Medicare Other

## 2016-11-23 ENCOUNTER — Encounter: Payer: Self-pay | Admitting: Hematology

## 2016-11-23 DIAGNOSIS — E860 Dehydration: Secondary | ICD-10-CM | POA: Insufficient documentation

## 2016-11-23 DIAGNOSIS — Z681 Body mass index (BMI) 19 or less, adult: Secondary | ICD-10-CM | POA: Diagnosis not present

## 2016-11-23 DIAGNOSIS — M2550 Pain in unspecified joint: Secondary | ICD-10-CM | POA: Diagnosis not present

## 2016-11-23 DIAGNOSIS — Z87891 Personal history of nicotine dependence: Secondary | ICD-10-CM | POA: Insufficient documentation

## 2016-11-23 DIAGNOSIS — N189 Chronic kidney disease, unspecified: Secondary | ICD-10-CM | POA: Insufficient documentation

## 2016-11-23 DIAGNOSIS — N289 Disorder of kidney and ureter, unspecified: Secondary | ICD-10-CM | POA: Diagnosis not present

## 2016-11-23 DIAGNOSIS — R531 Weakness: Secondary | ICD-10-CM | POA: Insufficient documentation

## 2016-11-23 DIAGNOSIS — D649 Anemia, unspecified: Secondary | ICD-10-CM | POA: Diagnosis not present

## 2016-11-23 DIAGNOSIS — J439 Emphysema, unspecified: Secondary | ICD-10-CM | POA: Diagnosis not present

## 2016-11-23 LAB — URINALYSIS, ROUTINE W REFLEX MICROSCOPIC
Bacteria, UA: NONE SEEN
Bilirubin Urine: NEGATIVE
Glucose, UA: NEGATIVE mg/dL
Hgb urine dipstick: NEGATIVE
Ketones, ur: NEGATIVE mg/dL
Leukocytes, UA: NEGATIVE
Nitrite: NEGATIVE
Protein, ur: 30 mg/dL — AB
Specific Gravity, Urine: 1.017 (ref 1.005–1.030)
pH: 5 (ref 5.0–8.0)

## 2016-11-23 LAB — COMPREHENSIVE METABOLIC PANEL
ALT: 14 U/L — ABNORMAL LOW (ref 17–63)
AST: 16 U/L (ref 15–41)
Albumin: 3.4 g/dL — ABNORMAL LOW (ref 3.5–5.0)
Alkaline Phosphatase: 76 U/L (ref 38–126)
Anion gap: 10 (ref 5–15)
BUN: 32 mg/dL — ABNORMAL HIGH (ref 6–20)
CO2: 24 mmol/L (ref 22–32)
Calcium: 9.6 mg/dL (ref 8.9–10.3)
Chloride: 98 mmol/L — ABNORMAL LOW (ref 101–111)
Creatinine, Ser: 2.03 mg/dL — ABNORMAL HIGH (ref 0.61–1.24)
GFR calc Af Amer: 33 mL/min — ABNORMAL LOW (ref >60)
GFR calc non Af Amer: 28 mL/min — ABNORMAL LOW (ref >60)
Glucose, Bld: 102 mg/dL — ABNORMAL HIGH (ref 65–99)
Potassium: 4.1 mmol/L (ref 3.5–5.1)
Sodium: 132 mmol/L — ABNORMAL LOW (ref 135–145)
Total Bilirubin: 1.5 mg/dL — ABNORMAL HIGH (ref 0.3–1.2)
Total Protein: 7.4 g/dL (ref 6.5–8.1)

## 2016-11-23 LAB — CBC WITH DIFFERENTIAL/PLATELET
Basophils Absolute: 0 10*3/uL (ref 0.0–0.1)
Basophils Relative: 0 %
Eosinophils Absolute: 0 10*3/uL (ref 0.0–0.7)
Eosinophils Relative: 0 %
HCT: 27.7 % — ABNORMAL LOW (ref 39.0–52.0)
Hemoglobin: 9.1 g/dL — ABNORMAL LOW (ref 13.0–17.0)
Lymphocytes Relative: 22 %
Lymphs Abs: 1.4 10*3/uL (ref 0.7–4.0)
MCH: 28.3 pg (ref 26.0–34.0)
MCHC: 32.9 g/dL (ref 30.0–36.0)
MCV: 86.3 fL (ref 78.0–100.0)
Monocytes Absolute: 0.5 10*3/uL (ref 0.1–1.0)
Monocytes Relative: 8 %
Neutro Abs: 4.4 10*3/uL (ref 1.7–7.7)
Neutrophils Relative %: 70 %
Platelets: 229 10*3/uL (ref 150–400)
RBC: 3.21 MIL/uL — ABNORMAL LOW (ref 4.22–5.81)
RDW: 19.2 % — ABNORMAL HIGH (ref 11.5–15.5)
WBC: 6.3 10*3/uL (ref 4.0–10.5)

## 2016-11-23 MED ORDER — SODIUM CHLORIDE 0.9 % IV BOLUS (SEPSIS)
1000.0000 mL | Freq: Once | INTRAVENOUS | Status: AC
  Administered 2016-11-23: 1000 mL via INTRAVENOUS

## 2016-11-23 MED ORDER — SODIUM CHLORIDE 0.9 % IV BOLUS (SEPSIS)
1000.0000 mL | Freq: Once | INTRAVENOUS | Status: AC
Start: 2016-11-23 — End: 2016-11-23
  Administered 2016-11-23: 1000 mL via INTRAVENOUS

## 2016-11-23 NOTE — ED Provider Notes (Signed)
[facility] DEPT Provider Note   CSN: [id number] Arrival date & time: 11/23/16  1413     History   Chief Complaint Chief Complaint  Patient presents with  . Weakness    HPI [name] is a 82 y.o. male.  HPI  Patient presents with concern of weakness. He notes onset was about 4 days ago, since onset he has had persistent generalized weakness, and soreness with motion, but no focal pain anywhere. He states that his weakness actually began 3 months ago after fall, but was minor until a few days ago. There is associated generalized weight loss, fatigue, anorexia, but no red blood per rectum, no hematuria, no hematemesis. Patient states that he is generally well, denies medical prompt, states that he takes no medication.   Past Medical History:  Diagnosis Date  . Chronic kidney insufficiency     Patient Active Problem List   Diagnosis Date Noted  . Abnormal CT of the abdomen 11/02/2016  . Loss of weight 11/02/2016  . History of colonic polyps 11/02/2016  . GERD (gastroesophageal reflux disease) 10/01/2015    Past Surgical History:  Procedure Laterality Date  . CATARACT EXTRACTION W/PHACO Right 03/09/2016   Procedure: CATARACT EXTRACTION PHACO AND INTRAOCULAR LENS PLACEMENT RIGHT EYE CDE=8.58;  Surgeon: Tonny Branch, MD;  Location: [facility];  Service: Ophthalmology;  Laterality: Right;  right  . CATARACT EXTRACTION W/PHACO Left 04/13/2016   Procedure: CATARACT EXTRACTION PHACO AND INTRAOCULAR LENS PLACEMENT (IOC);  Surgeon: Tonny Branch, MD;  Location: [facility];  Service: Ophthalmology;  Laterality: Left;  CDE: 8.08  . None to Date  10/01/15       Home Medications    Prior to Admission medications   Medication Sig Start Date End Date Taking? Authorizing Provider  Na Sulfate-K Sulfate-Mg Sulf (SUPREP BOWEL PREP KIT) 17.5-3.13-1.6 GM/180ML SOLN Take 1 kit by mouth as directed. 11/02/16   Rourk, Cristopher Estimable, MD    Family History Family History  Problem Relation Age of  Onset  . Kidney disease Mother   . Kidney disease Sister   . Colon cancer Neg Hx   . Gastric cancer Neg Hx   . Esophageal cancer Neg Hx     Social History Social History  Substance Use Topics  . Smoking status: Former Smoker    Years: 15.00    Types: Cigarettes, Cigars    Quit date: 10/01/1990  . Smokeless tobacco: Never Used     Comment: Quit x 25-30 years; 2 cigars daily when smoked  . Alcohol use 0.0 oz/week     Comment: Glass of wine 1-2 times a week-"sometimes"     Allergies   Patient has no known allergies.   Review of Systems Review of Systems  Constitutional:       Per HPI, otherwise negative  HENT:       Per HPI, otherwise negative  Respiratory:       Per HPI, otherwise negative  Cardiovascular:       Per HPI, otherwise negative  Gastrointestinal: Negative for vomiting.  Endocrine:       Negative aside from HPI  Genitourinary:       Neg aside from HPI   Musculoskeletal:       Per HPI, otherwise negative  Skin: Negative.   Neurological: Positive for weakness. Negative for syncope.     Physical Exam Updated Vital Signs BP 90/60 (BP Location: Left Arm)   Pulse 87   Temp 98.1 F (36.7 C) (Oral)   Resp  20   Ht 6' 4" (1.93 m)   Wt 59.4 kg (131 lb)   SpO2 95%   BMI 15.95 kg/m   Physical Exam  Constitutional: He is oriented to person, place, and time. He has a sickly appearance. No distress.  HENT:  Head: Normocephalic and atraumatic.  Eyes: Conjunctivae and EOM are normal.  Cardiovascular: Normal rate and regular rhythm.   Pulmonary/Chest: Effort normal. No stridor. No respiratory distress.  Abdominal: He exhibits no distension.  Musculoskeletal: He exhibits no edema.  Neurological: He is alert and oriented to person, place, and time.  Skin: Skin is warm and dry.  Psychiatric: He has a normal mood and affect.  Nursing note and vitals reviewed.    ED Treatments / Results  Labs (all labs ordered are listed, but only abnormal results are  displayed) Labs Reviewed  COMPREHENSIVE METABOLIC PANEL - Abnormal; Notable for the following:       Result Value   Sodium 132 (*)    Chloride 98 (*)    Glucose, Bld 102 (*)    BUN 32 (*)    Creatinine, Ser 2.03 (*)    Albumin 3.4 (*)    ALT 14 (*)    Total Bilirubin 1.5 (*)    GFR calc non Af Amer 28 (*)    GFR calc Af Amer 33 (*)    All other components within normal limits  CBC WITH DIFFERENTIAL/PLATELET - Abnormal; Notable for the following:    RBC 3.21 (*)    Hemoglobin 9.1 (*)    HCT 27.7 (*)    RDW 19.2 (*)    All other components within normal limits  URINALYSIS, ROUTINE W REFLEX MICROSCOPIC    EKG  EKG Interpretation  Date/Time:  Monday November 23 2016 14:44:46 EDT Ventricular Rate:  85 PR Interval:    QRS Duration: 99 QT Interval:  346 QTC Calculation: 412 R Axis:   85 Text Interpretation:  Sinus rhythm Ventricular premature complex Borderline right axis deviation Baseline wander in lead(s) III Abnormal ekg Confirmed by Carmin Muskrat [phone]) on 11/23/2016 2:50:59 PM       Radiology Dg Chest 2 View  Result Date: 11/23/2016 CLINICAL DATA:  Weakness and joint pain over the last 4-5 days. EXAM: CHEST  2 VIEW COMPARISON:  09/23/2016.  Chest CT 09/23/2016. FINDINGS: Heart size is normal. There is aortic atherosclerosis. There is a background pattern of emphysema and scarring at the lung apices. No evidence of infiltrate, collapse or effusion. Old compression fractures seen within the thoracic spine. IMPRESSION: No active disease. Emphysema and pulmonary scarring. Old thoracic compression fractures. Electronically Signed   By: Nelson Chimes M.D.   On: 11/23/2016 15:57    Procedures Procedures (including critical care time)  Medications Ordered in ED Medications  sodium chloride 0.9 % bolus 1,000 mL (0 mLs Intravenous Stopped 11/23/16 1721)  sodium chloride 0.9 % bolus 1,000 mL (0 mLs Intravenous Stopped 11/23/16 1903)     Initial Impression / Assessment and Plan / ED  Course  I have reviewed the triage vital signs and the nursing notes.  Pertinent labs & imaging results that were available during my care of the patient were reviewed by me and considered in my medical decision making (see chart for details).  7:23 PM After 2 L fluid resuscitation the patient is substantially better, moving both legs freely, states that he feels much much better. We had a lengthy conversation about his COPD, and anemia. Patient will follow-up with primary care for  further evaluation in the coming days. With substantial improvement of his weakness, there is suspicion for dehydration as contributing to his illness today. With otherwise reassuring findings, and/or demonstration of chronic condition, the patient is appropriate for discharge with outpatient follow-up.   Final Clinical Impressions(s) / ED Diagnoses  Acute on chronic kidney disease Weakness Anemia   Carmin Muskrat, MD 11/23/16 1924

## 2016-11-23 NOTE — ED Triage Notes (Signed)
Pt reports generalized joint pain X4-5 days, seen by PCP who told him he was anemic and dehydrated. Pt denies blood in BM or urine, cough, or decreased appetitive.

## 2016-11-23 NOTE — ED Notes (Signed)
Attempted x 2 for IV access without success, CN in room to assess for site

## 2016-11-23 NOTE — ED Notes (Signed)
Pt states he does not need to urinate at this time, aware of DO  

## 2016-11-23 NOTE — Discharge Instructions (Signed)
As discussed, your evaluation today has been largely reassuring.  But, it is important that you monitor your condition carefully, and do not hesitate to return to the ED if you develop new, or concerning changes in your condition.  Otherwise, please follow-up with your physician for appropriate ongoing care.  Please be sure to discuss her chronic kidney disease and your anemia.

## 2016-11-23 NOTE — ED Notes (Signed)
Patient transported to X-ray 

## 2016-11-24 DIAGNOSIS — N189 Chronic kidney disease, unspecified: Secondary | ICD-10-CM | POA: Diagnosis not present

## 2016-11-24 DIAGNOSIS — N289 Disorder of kidney and ureter, unspecified: Secondary | ICD-10-CM | POA: Diagnosis not present

## 2016-11-24 DIAGNOSIS — E860 Dehydration: Secondary | ICD-10-CM | POA: Diagnosis not present

## 2016-11-24 DIAGNOSIS — Z681 Body mass index (BMI) 19 or less, adult: Secondary | ICD-10-CM | POA: Diagnosis not present

## 2016-11-26 DIAGNOSIS — N189 Chronic kidney disease, unspecified: Secondary | ICD-10-CM | POA: Diagnosis not present

## 2016-11-26 DIAGNOSIS — E860 Dehydration: Secondary | ICD-10-CM | POA: Diagnosis not present

## 2016-11-26 DIAGNOSIS — N289 Disorder of kidney and ureter, unspecified: Secondary | ICD-10-CM | POA: Diagnosis not present

## 2016-11-26 DIAGNOSIS — Z681 Body mass index (BMI) 19 or less, adult: Secondary | ICD-10-CM | POA: Diagnosis not present

## 2016-12-02 DIAGNOSIS — R52 Pain, unspecified: Secondary | ICD-10-CM | POA: Diagnosis present

## 2016-12-02 DIAGNOSIS — K317 Polyp of stomach and duodenum: Secondary | ICD-10-CM | POA: Diagnosis not present

## 2016-12-02 DIAGNOSIS — N289 Disorder of kidney and ureter, unspecified: Secondary | ICD-10-CM | POA: Diagnosis not present

## 2016-12-02 DIAGNOSIS — M4854XA Collapsed vertebra, not elsewhere classified, thoracic region, initial encounter for fracture: Secondary | ICD-10-CM | POA: Diagnosis not present

## 2016-12-02 DIAGNOSIS — E860 Dehydration: Secondary | ICD-10-CM | POA: Diagnosis not present

## 2016-12-02 DIAGNOSIS — K2950 Unspecified chronic gastritis without bleeding: Secondary | ICD-10-CM | POA: Diagnosis not present

## 2016-12-02 DIAGNOSIS — K2970 Gastritis, unspecified, without bleeding: Secondary | ICD-10-CM | POA: Diagnosis not present

## 2016-12-02 DIAGNOSIS — R079 Chest pain, unspecified: Secondary | ICD-10-CM | POA: Diagnosis not present

## 2016-12-02 DIAGNOSIS — S32000A Wedge compression fracture of unspecified lumbar vertebra, initial encounter for closed fracture: Secondary | ICD-10-CM | POA: Diagnosis not present

## 2016-12-02 DIAGNOSIS — R531 Weakness: Secondary | ICD-10-CM | POA: Diagnosis not present

## 2016-12-02 DIAGNOSIS — Z8781 Personal history of (healed) traumatic fracture: Secondary | ICD-10-CM | POA: Diagnosis not present

## 2016-12-02 DIAGNOSIS — R6881 Early satiety: Secondary | ICD-10-CM | POA: Diagnosis not present

## 2016-12-02 DIAGNOSIS — R0989 Other specified symptoms and signs involving the circulatory and respiratory systems: Secondary | ICD-10-CM | POA: Diagnosis not present

## 2016-12-02 DIAGNOSIS — Z9181 History of falling: Secondary | ICD-10-CM | POA: Diagnosis not present

## 2016-12-02 DIAGNOSIS — K5730 Diverticulosis of large intestine without perforation or abscess without bleeding: Secondary | ICD-10-CM | POA: Diagnosis present

## 2016-12-02 DIAGNOSIS — N281 Cyst of kidney, acquired: Secondary | ICD-10-CM | POA: Diagnosis not present

## 2016-12-02 DIAGNOSIS — D6489 Other specified anemias: Secondary | ICD-10-CM | POA: Diagnosis not present

## 2016-12-02 DIAGNOSIS — K5790 Diverticulosis of intestine, part unspecified, without perforation or abscess without bleeding: Secondary | ICD-10-CM | POA: Diagnosis not present

## 2016-12-02 DIAGNOSIS — R740 Nonspecific elevation of levels of transaminase and lactic acid dehydrogenase [LDH]: Secondary | ICD-10-CM | POA: Diagnosis not present

## 2016-12-02 DIAGNOSIS — M810 Age-related osteoporosis without current pathological fracture: Secondary | ICD-10-CM | POA: Diagnosis present

## 2016-12-02 DIAGNOSIS — D120 Benign neoplasm of cecum: Secondary | ICD-10-CM | POA: Diagnosis not present

## 2016-12-02 DIAGNOSIS — R627 Adult failure to thrive: Secondary | ICD-10-CM | POA: Diagnosis not present

## 2016-12-02 DIAGNOSIS — R634 Abnormal weight loss: Secondary | ICD-10-CM | POA: Diagnosis not present

## 2016-12-02 DIAGNOSIS — K319 Disease of stomach and duodenum, unspecified: Secondary | ICD-10-CM | POA: Diagnosis present

## 2016-12-02 DIAGNOSIS — R5383 Other fatigue: Secondary | ICD-10-CM | POA: Diagnosis not present

## 2016-12-02 DIAGNOSIS — Z87891 Personal history of nicotine dependence: Secondary | ICD-10-CM | POA: Diagnosis not present

## 2016-12-02 DIAGNOSIS — M6281 Muscle weakness (generalized): Secondary | ICD-10-CM | POA: Diagnosis not present

## 2016-12-02 DIAGNOSIS — M4856XA Collapsed vertebra, not elsewhere classified, lumbar region, initial encounter for fracture: Secondary | ICD-10-CM | POA: Diagnosis not present

## 2016-12-02 DIAGNOSIS — D649 Anemia, unspecified: Secondary | ICD-10-CM | POA: Diagnosis not present

## 2016-12-02 DIAGNOSIS — D509 Iron deficiency anemia, unspecified: Secondary | ICD-10-CM | POA: Diagnosis not present

## 2016-12-08 ENCOUNTER — Encounter: Payer: Self-pay | Admitting: Hematology

## 2016-12-11 DIAGNOSIS — M8088XD Other osteoporosis with current pathological fracture, vertebra(e), subsequent encounter for fracture with routine healing: Secondary | ICD-10-CM | POA: Diagnosis not present

## 2016-12-11 DIAGNOSIS — R627 Adult failure to thrive: Secondary | ICD-10-CM | POA: Diagnosis not present

## 2016-12-11 DIAGNOSIS — Z7952 Long term (current) use of systemic steroids: Secondary | ICD-10-CM | POA: Diagnosis not present

## 2016-12-11 DIAGNOSIS — D649 Anemia, unspecified: Secondary | ICD-10-CM | POA: Diagnosis not present

## 2016-12-11 DIAGNOSIS — Z9181 History of falling: Secondary | ICD-10-CM | POA: Diagnosis not present

## 2016-12-11 DIAGNOSIS — K635 Polyp of colon: Secondary | ICD-10-CM | POA: Diagnosis not present

## 2016-12-14 ENCOUNTER — Ambulatory Visit: Payer: Medicare Other | Admitting: Nurse Practitioner

## 2016-12-14 DIAGNOSIS — R627 Adult failure to thrive: Secondary | ICD-10-CM | POA: Diagnosis not present

## 2016-12-14 DIAGNOSIS — D649 Anemia, unspecified: Secondary | ICD-10-CM | POA: Diagnosis not present

## 2016-12-14 DIAGNOSIS — M8088XD Other osteoporosis with current pathological fracture, vertebra(e), subsequent encounter for fracture with routine healing: Secondary | ICD-10-CM | POA: Diagnosis not present

## 2016-12-14 DIAGNOSIS — Z9181 History of falling: Secondary | ICD-10-CM | POA: Diagnosis not present

## 2016-12-14 DIAGNOSIS — K635 Polyp of colon: Secondary | ICD-10-CM | POA: Diagnosis not present

## 2016-12-15 DIAGNOSIS — R627 Adult failure to thrive: Secondary | ICD-10-CM | POA: Diagnosis not present

## 2016-12-15 DIAGNOSIS — D649 Anemia, unspecified: Secondary | ICD-10-CM | POA: Diagnosis not present

## 2016-12-15 DIAGNOSIS — K635 Polyp of colon: Secondary | ICD-10-CM | POA: Diagnosis not present

## 2016-12-15 DIAGNOSIS — Z9181 History of falling: Secondary | ICD-10-CM | POA: Diagnosis not present

## 2016-12-15 DIAGNOSIS — M8088XD Other osteoporosis with current pathological fracture, vertebra(e), subsequent encounter for fracture with routine healing: Secondary | ICD-10-CM | POA: Diagnosis not present

## 2016-12-18 DIAGNOSIS — Z9181 History of falling: Secondary | ICD-10-CM | POA: Diagnosis not present

## 2016-12-18 DIAGNOSIS — R627 Adult failure to thrive: Secondary | ICD-10-CM | POA: Diagnosis not present

## 2016-12-18 DIAGNOSIS — D649 Anemia, unspecified: Secondary | ICD-10-CM | POA: Diagnosis not present

## 2016-12-18 DIAGNOSIS — M8088XD Other osteoporosis with current pathological fracture, vertebra(e), subsequent encounter for fracture with routine healing: Secondary | ICD-10-CM | POA: Diagnosis not present

## 2016-12-18 DIAGNOSIS — K635 Polyp of colon: Secondary | ICD-10-CM | POA: Diagnosis not present

## 2016-12-22 DIAGNOSIS — K635 Polyp of colon: Secondary | ICD-10-CM | POA: Diagnosis not present

## 2016-12-22 DIAGNOSIS — D649 Anemia, unspecified: Secondary | ICD-10-CM | POA: Diagnosis not present

## 2016-12-22 DIAGNOSIS — M8088XD Other osteoporosis with current pathological fracture, vertebra(e), subsequent encounter for fracture with routine healing: Secondary | ICD-10-CM | POA: Diagnosis not present

## 2016-12-22 DIAGNOSIS — Z9181 History of falling: Secondary | ICD-10-CM | POA: Diagnosis not present

## 2016-12-22 DIAGNOSIS — R627 Adult failure to thrive: Secondary | ICD-10-CM | POA: Diagnosis not present

## 2016-12-23 DIAGNOSIS — D649 Anemia, unspecified: Secondary | ICD-10-CM | POA: Diagnosis not present

## 2016-12-23 DIAGNOSIS — M8088XD Other osteoporosis with current pathological fracture, vertebra(e), subsequent encounter for fracture with routine healing: Secondary | ICD-10-CM | POA: Diagnosis not present

## 2016-12-23 DIAGNOSIS — R627 Adult failure to thrive: Secondary | ICD-10-CM | POA: Diagnosis not present

## 2016-12-23 DIAGNOSIS — Z9181 History of falling: Secondary | ICD-10-CM | POA: Diagnosis not present

## 2016-12-23 DIAGNOSIS — K635 Polyp of colon: Secondary | ICD-10-CM | POA: Diagnosis not present

## 2016-12-24 ENCOUNTER — Ambulatory Visit (HOSPITAL_COMMUNITY): Admission: RE | Admit: 2016-12-24 | Payer: Medicare Other | Source: Ambulatory Visit | Admitting: Internal Medicine

## 2016-12-24 ENCOUNTER — Encounter (HOSPITAL_COMMUNITY): Admission: RE | Payer: Self-pay | Source: Ambulatory Visit

## 2016-12-24 DIAGNOSIS — J984 Other disorders of lung: Secondary | ICD-10-CM | POA: Diagnosis not present

## 2016-12-24 DIAGNOSIS — M810 Age-related osteoporosis without current pathological fracture: Secondary | ICD-10-CM | POA: Diagnosis not present

## 2016-12-24 DIAGNOSIS — K5900 Constipation, unspecified: Secondary | ICD-10-CM | POA: Diagnosis not present

## 2016-12-24 DIAGNOSIS — N179 Acute kidney failure, unspecified: Secondary | ICD-10-CM | POA: Diagnosis not present

## 2016-12-24 DIAGNOSIS — R509 Fever, unspecified: Secondary | ICD-10-CM | POA: Diagnosis not present

## 2016-12-24 DIAGNOSIS — R7989 Other specified abnormal findings of blood chemistry: Secondary | ICD-10-CM | POA: Diagnosis not present

## 2016-12-24 DIAGNOSIS — Z87311 Personal history of (healed) other pathological fracture: Secondary | ICD-10-CM | POA: Diagnosis not present

## 2016-12-24 DIAGNOSIS — M546 Pain in thoracic spine: Secondary | ICD-10-CM | POA: Diagnosis not present

## 2016-12-24 DIAGNOSIS — M4850XA Collapsed vertebra, not elsewhere classified, site unspecified, initial encounter for fracture: Secondary | ICD-10-CM | POA: Diagnosis not present

## 2016-12-24 DIAGNOSIS — R944 Abnormal results of kidney function studies: Secondary | ICD-10-CM | POA: Diagnosis not present

## 2016-12-24 DIAGNOSIS — Z9889 Other specified postprocedural states: Secondary | ICD-10-CM | POA: Diagnosis not present

## 2016-12-24 DIAGNOSIS — N289 Disorder of kidney and ureter, unspecified: Secondary | ICD-10-CM | POA: Diagnosis not present

## 2016-12-24 DIAGNOSIS — M8448XD Pathological fracture, other site, subsequent encounter for fracture with routine healing: Secondary | ICD-10-CM | POA: Diagnosis not present

## 2016-12-24 DIAGNOSIS — M40204 Unspecified kyphosis, thoracic region: Secondary | ICD-10-CM | POA: Diagnosis not present

## 2016-12-24 DIAGNOSIS — N281 Cyst of kidney, acquired: Secondary | ICD-10-CM | POA: Diagnosis not present

## 2016-12-24 DIAGNOSIS — M8588 Other specified disorders of bone density and structure, other site: Secondary | ICD-10-CM | POA: Diagnosis not present

## 2016-12-24 DIAGNOSIS — Z9181 History of falling: Secondary | ICD-10-CM | POA: Diagnosis not present

## 2016-12-24 DIAGNOSIS — Z87891 Personal history of nicotine dependence: Secondary | ICD-10-CM | POA: Diagnosis not present

## 2016-12-24 DIAGNOSIS — M791 Myalgia: Secondary | ICD-10-CM | POA: Diagnosis not present

## 2016-12-24 DIAGNOSIS — R52 Pain, unspecified: Secondary | ICD-10-CM | POA: Diagnosis not present

## 2016-12-24 DIAGNOSIS — I2510 Atherosclerotic heart disease of native coronary artery without angina pectoris: Secondary | ICD-10-CM | POA: Diagnosis not present

## 2016-12-24 DIAGNOSIS — K5730 Diverticulosis of large intestine without perforation or abscess without bleeding: Secondary | ICD-10-CM | POA: Diagnosis not present

## 2016-12-24 DIAGNOSIS — D469 Myelodysplastic syndrome, unspecified: Secondary | ICD-10-CM | POA: Diagnosis not present

## 2016-12-24 DIAGNOSIS — R079 Chest pain, unspecified: Secondary | ICD-10-CM | POA: Diagnosis not present

## 2016-12-24 SURGERY — COLONOSCOPY
Anesthesia: Moderate Sedation

## 2016-12-25 DIAGNOSIS — R509 Fever, unspecified: Secondary | ICD-10-CM | POA: Diagnosis not present

## 2016-12-25 DIAGNOSIS — M546 Pain in thoracic spine: Secondary | ICD-10-CM | POA: Diagnosis not present

## 2016-12-25 DIAGNOSIS — N179 Acute kidney failure, unspecified: Secondary | ICD-10-CM | POA: Diagnosis not present

## 2016-12-25 DIAGNOSIS — M791 Myalgia: Secondary | ICD-10-CM | POA: Diagnosis not present

## 2016-12-29 DIAGNOSIS — M8088XD Other osteoporosis with current pathological fracture, vertebra(e), subsequent encounter for fracture with routine healing: Secondary | ICD-10-CM | POA: Diagnosis not present

## 2016-12-29 DIAGNOSIS — R627 Adult failure to thrive: Secondary | ICD-10-CM | POA: Diagnosis not present

## 2016-12-29 DIAGNOSIS — K635 Polyp of colon: Secondary | ICD-10-CM | POA: Diagnosis not present

## 2016-12-29 DIAGNOSIS — D649 Anemia, unspecified: Secondary | ICD-10-CM | POA: Diagnosis not present

## 2016-12-29 DIAGNOSIS — Z9181 History of falling: Secondary | ICD-10-CM | POA: Diagnosis not present

## 2017-01-01 DIAGNOSIS — D469 Myelodysplastic syndrome, unspecified: Secondary | ICD-10-CM | POA: Diagnosis not present

## 2017-01-01 DIAGNOSIS — D46Z Other myelodysplastic syndromes: Secondary | ICD-10-CM | POA: Diagnosis not present

## 2017-01-11 DIAGNOSIS — Z9181 History of falling: Secondary | ICD-10-CM | POA: Diagnosis not present

## 2017-01-11 DIAGNOSIS — K635 Polyp of colon: Secondary | ICD-10-CM | POA: Diagnosis not present

## 2017-01-11 DIAGNOSIS — D649 Anemia, unspecified: Secondary | ICD-10-CM | POA: Diagnosis not present

## 2017-01-11 DIAGNOSIS — M8088XD Other osteoporosis with current pathological fracture, vertebra(e), subsequent encounter for fracture with routine healing: Secondary | ICD-10-CM | POA: Diagnosis not present

## 2017-01-11 DIAGNOSIS — R627 Adult failure to thrive: Secondary | ICD-10-CM | POA: Diagnosis not present

## 2017-02-01 DIAGNOSIS — D469 Myelodysplastic syndrome, unspecified: Secondary | ICD-10-CM | POA: Diagnosis not present

## 2017-02-02 ENCOUNTER — Ambulatory Visit: Payer: Medicare Other | Admitting: Nurse Practitioner

## 2017-02-02 DIAGNOSIS — R933 Abnormal findings on diagnostic imaging of other parts of digestive tract: Secondary | ICD-10-CM | POA: Diagnosis not present

## 2017-02-03 DIAGNOSIS — D46Z Other myelodysplastic syndromes: Secondary | ICD-10-CM | POA: Diagnosis not present

## 2017-02-17 DIAGNOSIS — R7989 Other specified abnormal findings of blood chemistry: Secondary | ICD-10-CM | POA: Diagnosis not present

## 2017-02-17 DIAGNOSIS — N2581 Secondary hyperparathyroidism of renal origin: Secondary | ICD-10-CM | POA: Diagnosis not present

## 2017-02-17 DIAGNOSIS — N183 Chronic kidney disease, stage 3 (moderate): Secondary | ICD-10-CM | POA: Diagnosis not present

## 2017-03-15 DIAGNOSIS — D46Z Other myelodysplastic syndromes: Secondary | ICD-10-CM | POA: Diagnosis not present

## 2017-03-15 DIAGNOSIS — D469 Myelodysplastic syndrome, unspecified: Secondary | ICD-10-CM | POA: Diagnosis not present

## 2017-03-23 DIAGNOSIS — Z1389 Encounter for screening for other disorder: Secondary | ICD-10-CM | POA: Diagnosis not present

## 2017-03-23 DIAGNOSIS — Z0000 Encounter for general adult medical examination without abnormal findings: Secondary | ICD-10-CM | POA: Diagnosis not present

## 2017-03-23 DIAGNOSIS — Z681 Body mass index (BMI) 19 or less, adult: Secondary | ICD-10-CM | POA: Diagnosis not present

## 2017-04-13 DIAGNOSIS — D469 Myelodysplastic syndrome, unspecified: Secondary | ICD-10-CM | POA: Diagnosis not present

## 2017-04-14 DIAGNOSIS — D46Z Other myelodysplastic syndromes: Secondary | ICD-10-CM | POA: Diagnosis not present

## 2017-04-21 DIAGNOSIS — C44310 Basal cell carcinoma of skin of unspecified parts of face: Secondary | ICD-10-CM | POA: Diagnosis not present

## 2017-04-21 DIAGNOSIS — Z681 Body mass index (BMI) 19 or less, adult: Secondary | ICD-10-CM | POA: Diagnosis not present

## 2017-05-05 DIAGNOSIS — D469 Myelodysplastic syndrome, unspecified: Secondary | ICD-10-CM | POA: Diagnosis not present

## 2017-05-06 DIAGNOSIS — D46Z Other myelodysplastic syndromes: Secondary | ICD-10-CM | POA: Diagnosis not present

## 2017-05-27 DIAGNOSIS — C44329 Squamous cell carcinoma of skin of other parts of face: Secondary | ICD-10-CM | POA: Diagnosis not present

## 2017-06-23 DIAGNOSIS — D46Z Other myelodysplastic syndromes: Secondary | ICD-10-CM | POA: Diagnosis not present

## 2017-06-23 DIAGNOSIS — D469 Myelodysplastic syndrome, unspecified: Secondary | ICD-10-CM | POA: Diagnosis not present

## 2017-07-05 DIAGNOSIS — N2581 Secondary hyperparathyroidism of renal origin: Secondary | ICD-10-CM | POA: Diagnosis not present

## 2017-07-05 DIAGNOSIS — R7989 Other specified abnormal findings of blood chemistry: Secondary | ICD-10-CM | POA: Diagnosis not present

## 2017-07-05 DIAGNOSIS — N183 Chronic kidney disease, stage 3 (moderate): Secondary | ICD-10-CM | POA: Diagnosis not present

## 2017-07-30 ENCOUNTER — Emergency Department (HOSPITAL_COMMUNITY)
Admission: EM | Admit: 2017-07-30 | Discharge: 2017-07-30 | Disposition: A | Discharge status: Home / Self Care | Payer: Medicare Other | Attending: Emergency Medicine | Admitting: Emergency Medicine

## 2017-07-30 ENCOUNTER — Encounter (HOSPITAL_COMMUNITY): Payer: Self-pay | Admitting: Emergency Medicine

## 2017-07-30 ENCOUNTER — Other Ambulatory Visit: Payer: Self-pay

## 2017-07-30 DIAGNOSIS — N189 Chronic kidney disease, unspecified: Secondary | ICD-10-CM | POA: Diagnosis not present

## 2017-07-30 DIAGNOSIS — Z79899 Other long term (current) drug therapy: Secondary | ICD-10-CM | POA: Diagnosis not present

## 2017-07-30 DIAGNOSIS — Z87891 Personal history of nicotine dependence: Secondary | ICD-10-CM | POA: Insufficient documentation

## 2017-07-30 DIAGNOSIS — R350 Frequency of micturition: Secondary | ICD-10-CM | POA: Diagnosis present

## 2017-07-30 DIAGNOSIS — N12 Tubulo-interstitial nephritis, not specified as acute or chronic: Secondary | ICD-10-CM | POA: Diagnosis not present

## 2017-07-30 LAB — URINALYSIS, ROUTINE W REFLEX MICROSCOPIC
Bilirubin Urine: NEGATIVE
Glucose, UA: NEGATIVE mg/dL
Ketones, ur: 20 mg/dL — AB
Nitrite: POSITIVE — AB
Protein, ur: 30 mg/dL — AB
Specific Gravity, Urine: 1.017 (ref 1.005–1.030)
pH: 5 (ref 5.0–8.0)

## 2017-07-30 LAB — CBC WITH DIFFERENTIAL/PLATELET
Basophils Absolute: 0.1 10*3/uL (ref 0.0–0.1)
Basophils Relative: 0 %
Eosinophils Absolute: 0 10*3/uL (ref 0.0–0.7)
Eosinophils Relative: 0 %
HCT: 34.2 % — ABNORMAL LOW (ref 39.0–52.0)
Hemoglobin: 11.1 g/dL — ABNORMAL LOW (ref 13.0–17.0)
Lymphocytes Relative: 13 %
Lymphs Abs: 2 10*3/uL (ref 0.7–4.0)
MCH: 27.8 pg (ref 26.0–34.0)
MCHC: 32.5 g/dL (ref 30.0–36.0)
MCV: 85.5 fL (ref 78.0–100.0)
Monocytes Absolute: 1.2 10*3/uL (ref 0.1–1.0)
Monocytes Relative: 8 %
Neutro Abs: 12.1 10*3/uL (ref 1.7–7.7)
Neutrophils Relative %: 79 %
Platelets: 97 10*3/uL — ABNORMAL LOW (ref 150–400)
RBC: 4 MIL/uL — ABNORMAL LOW (ref 4.22–5.81)
RDW: 19.7 % — ABNORMAL HIGH (ref 11.5–15.5)
WBC: 15.3 10*3/uL — ABNORMAL HIGH (ref 4.0–10.5)

## 2017-07-30 LAB — COMPREHENSIVE METABOLIC PANEL
ALT: 10 U/L — ABNORMAL LOW (ref 17–63)
AST: 14 U/L — ABNORMAL LOW (ref 15–41)
Albumin: 3.9 g/dL (ref 3.5–5.0)
Alkaline Phosphatase: 71 U/L (ref 38–126)
Anion gap: 14 (ref 5–15)
BUN: 26 mg/dL — ABNORMAL HIGH (ref 6–20)
CO2: 21 mmol/L — ABNORMAL LOW (ref 22–32)
Calcium: 9.2 mg/dL (ref 8.9–10.3)
Chloride: 102 mmol/L (ref 101–111)
Creatinine, Ser: 1.74 mg/dL — ABNORMAL HIGH (ref 0.61–1.24)
GFR calc Af Amer: 39 mL/min — ABNORMAL LOW (ref >60)
GFR calc non Af Amer: 34 mL/min — ABNORMAL LOW (ref >60)
Glucose, Bld: 91 mg/dL (ref 65–99)
Potassium: 3.9 mmol/L (ref 3.5–5.1)
Sodium: 137 mmol/L (ref 135–145)
Total Bilirubin: 2 mg/dL — ABNORMAL HIGH (ref 0.3–1.2)
Total Protein: 7.2 g/dL (ref 6.5–8.1)

## 2017-07-30 LAB — I-STAT CG4 LACTIC ACID, ED: Lactic Acid, Venous: 0.98 mmol/L (ref 0.5–1.9)

## 2017-07-30 MED ORDER — CEPHALEXIN 500 MG PO CAPS: 500.0000 mg | ORAL_CAPSULE | Freq: Two times a day (BID) | ORAL | 0 refills | Status: AC

## 2017-07-30 MED ORDER — CEFTRIAXONE SODIUM 2 G IJ SOLR
2.0000 g | Freq: Once | INTRAMUSCULAR | Status: AC
  Administered 2017-07-30: 2 g via INTRAVENOUS
  Filled 2017-07-30: qty 20

## 2017-07-30 MED ORDER — ONDANSETRON 4 MG PO TBDP: 4.0000 mg | ORAL_TABLET | Freq: Three times a day (TID) | ORAL | 0 refills | Status: DC | PRN

## 2017-07-30 MED ORDER — SODIUM CHLORIDE 0.9 % IV BOLUS (SEPSIS)
1000.0000 mL | Freq: Once | INTRAVENOUS | Status: AC
  Administered 2017-07-30: 1000 mL via INTRAVENOUS

## 2017-07-30 MED ORDER — IBUPROFEN 400 MG PO TABS: 400.0000 mg | ORAL_TABLET | Freq: Four times a day (QID) | ORAL | 0 refills | Status: DC | PRN

## 2017-07-30 NOTE — ED Provider Notes (Signed)
[facility] EMERGENCY DEPARTMENT Provider Note   CSN: [id number] Arrival date & time: 07/30/17  1222     History   Chief Complaint Chief Complaint  Patient presents with  . Urinary Frequency    HPI [name] is a 84 y.o. male.  HPI  The pt is an 84 y/o male - he has known history of GERD.  Some hx of increased freq, urgency and burning with urination since yesterday morning.  Has had chills 2 nights ago - unsure if objective fevers since then.  Sx are constant, moderate and worsening, no assocaited vomiting.  He has had some lower back pain bilaterally He has not been light headed or nauseated  He reports hx of prior UTI - has had Urology f/u and Nephrology f/u in past - give reassuring advice - no f/u.      Past Medical History:  Diagnosis Date  . Chronic kidney insufficiency     Patient Active Problem List   Diagnosis Date Noted  . Abnormal CT of the abdomen 11/02/2016  . Loss of weight 11/02/2016  . History of colonic polyps 11/02/2016  . GERD (gastroesophageal reflux disease) 10/01/2015    Past Surgical History:  Procedure Laterality Date  . CATARACT EXTRACTION W/PHACO Right 03/09/2016   Procedure: CATARACT EXTRACTION PHACO AND INTRAOCULAR LENS PLACEMENT RIGHT EYE CDE=8.58;  Surgeon: Tonny Branch, MD;  Location: [facility];  Service: Ophthalmology;  Laterality: Right;  right  . CATARACT EXTRACTION W/PHACO Left 04/13/2016   Procedure: CATARACT EXTRACTION PHACO AND INTRAOCULAR LENS PLACEMENT (IOC);  Surgeon: Tonny Branch, MD;  Location: [facility];  Service: Ophthalmology;  Laterality: Left;  CDE: 8.08  . None to Date  10/01/15       Home Medications    Prior to Admission medications   Medication Sig Start Date End Date Taking? Authorizing Provider  predniSONE (DELTASONE) 10 MG tablet Take 5 mg by mouth daily. 06/12/17  Yes [provider]  cephALEXin (KEFLEX) 500 MG capsule Take 1 capsule (500 mg total) by mouth 2 (two) times daily for 7 days. 07/30/17  08/06/17  Noemi Chapel, MD  ibuprofen (ADVIL,MOTRIN) 400 MG tablet Take 1 tablet (400 mg total) by mouth every 6 (six) hours as needed. 07/30/17   Noemi Chapel, MD  ondansetron (ZOFRAN ODT) 4 MG disintegrating tablet Take 1 tablet (4 mg total) by mouth every 8 (eight) hours as needed for nausea. 07/30/17   Noemi Chapel, MD    Family History Family History  Problem Relation Age of Onset  . Kidney disease Mother   . Kidney disease Sister   . Colon cancer Neg Hx   . Gastric cancer Neg Hx   . Esophageal cancer Neg Hx     Social History Social History   Tobacco Use  . Smoking status: Former Smoker    Years: 15.00    Types: Cigarettes, Cigars    Last attempt to quit: 10/01/1990    Years since quitting: 26.8  . Smokeless tobacco: Never Used  . Tobacco comment: Quit x 25-30 years; 2 cigars daily when smoked  Substance Use Topics  . Alcohol use: Yes    Alcohol/week: 0.0 oz    Comment: Glass of wine 1-2 times a week-"sometimes"  . Drug use: No     Allergies   Patient has no known allergies.   Review of Systems Review of Systems  Constitutional: Positive for chills and fever.  Respiratory: Positive for shortness of breath ( (chronic)).   Cardiovascular: Negative for leg  swelling.  Gastrointestinal: Positive for diarrhea ( mild this AM). Negative for nausea and vomiting.  Skin: Negative for rash.  Neurological: Negative for dizziness and headaches.  All other systems reviewed and are negative.    Physical Exam Updated Vital Signs BP 126/69 (BP Location: Left Arm)   Pulse 84   Temp 99.2 F (37.3 C) (Oral)   Resp 18   Ht 6\' 4"  (1.93 m)   Wt 63.5 kg (140 lb)   SpO2 98%   BMI 17.04 kg/m   Physical Exam  Constitutional: He appears well-developed and well-nourished. No distress.  HENT:  Head: Normocephalic and atraumatic.  Mouth/Throat: Oropharynx is clear and moist. No oropharyngeal exudate.  Eyes: Conjunctivae and EOM are normal. Pupils are equal, round, and  reactive to light. Right eye exhibits no discharge. Left eye exhibits no discharge. No scleral icterus.  Neck: Normal range of motion. Neck supple. No JVD present. No thyromegaly present.  Cardiovascular: Normal rate, regular rhythm, normal heart sounds and intact distal pulses. Exam reveals no gallop and no friction rub.  No murmur heard. Pulmonary/Chest: Effort normal and breath sounds normal. No respiratory distress. He has no wheezes. He has no rales.  Abdominal: Soft. Bowel sounds are normal. He exhibits no distension and no mass. There is tenderness ( epigastric and bilateral lower abd ttp).  Mild epigastric ttp, L side (lateral) tender on palpation - bilateral CVA ttp.    Genitourinary:  Genitourinary Comments: CVA ttp bilaterally  Musculoskeletal: Normal range of motion. He exhibits no edema or tenderness.  Lymphadenopathy:    He has no cervical adenopathy.  Neurological: He is alert. Coordination normal.  Skin: Skin is warm and dry. No rash noted. No erythema.  Psychiatric: He has a normal mood and affect. His behavior is normal.  Nursing note and vitals reviewed.    ED Treatments / Results  Labs (all labs ordered are listed, but only abnormal results are displayed) Labs Reviewed  URINALYSIS, ROUTINE W REFLEX MICROSCOPIC - Abnormal; Notable for the following components:      Result Value   Color, Urine AMBER (*)    APPearance CLOUDY (*)    Hgb urine dipstick MODERATE (*)    Ketones, ur 20 (*)    Protein, ur 30 (*)    Nitrite POSITIVE (*)    Leukocytes, UA LARGE (*)    Bacteria, UA MANY (*)    Squamous Epithelial / LPF 0-5 (*)    Non Squamous Epithelial 0-5 (*)    All other components within normal limits  CBC WITH DIFFERENTIAL/PLATELET - Abnormal; Notable for the following components:   WBC 15.3 (*)    RBC 4.00 (*)    Hemoglobin 11.1 (*)    HCT 34.2 (*)    RDW 19.7 (*)    Platelets 97 (*)    All other components within normal limits  COMPREHENSIVE METABOLIC PANEL  - Abnormal; Notable for the following components:   CO2 21 (*)    BUN 26 (*)    Creatinine, Ser 1.74 (*)    AST 14 (*)    ALT 10 (*)    Total Bilirubin 2.0 (*)    GFR calc non Af Amer 34 (*)    GFR calc Af Amer 39 (*)    All other components within normal limits  CULTURE, BLOOD (ROUTINE X 2)  CULTURE, BLOOD (ROUTINE X 2)  URINE CULTURE  I-STAT CG4 LACTIC ACID, ED  I-STAT CG4 LACTIC ACID, ED    EKG  EKG Interpretation  None       Radiology No results found.  Procedures Procedures (including critical care time)  Medications Ordered in ED Medications  sodium chloride 0.9 % bolus 1,000 mL (0 mLs Intravenous Stopped 07/30/17 1745)  sodium chloride 0.9 % bolus 1,000 mL (0 mLs Intravenous Stopped 07/30/17 1929)  cefTRIAXone (ROCEPHIN) 2 g in sodium chloride 0.9 % 100 mL IVPB (0 g Intravenous Stopped 07/30/17 1629)     Initial Impression / Assessment and Plan / ED Course  I have reviewed the triage vital signs and the nursing notes.  Pertinent labs & imaging results that were available during my care of the patient were reviewed by me and considered in my medical decision making (see chart for details).  Clinical Course as of Jul 31 1934  Fri Jul 30, 2017  1439 WBC: (!) 15.3 [BM]  1439 Hemoglobin: (!) 11.1 [BM]  1439 Creatinine: (!) 1.74 [BM]  1439 Pt has normal lytes Cr is up to 1.7 but at baseline CBC with leukocytosis and mild anemia Platelets low Platelets: (!) 97 [BM]  1439 Cr   [BM]  1608 UA with clear UTI - antibiotics ordered, lactic acid pending, fluid ordered.  [BM]    Clinical Course User Index [BM] Noemi Chapel, MD    I am concerned for sepsis / pyelonephritis for this man He has elevated WBC and has had some transient hypotension to 90 systolic Cr is at baseline, he is not tachycardic on exam, no vomiting. Fluids, antibiotics, lactic acid.  UTI present - abx and fluids given VS have been normal other than one abberant BP  Pt wants to go home, seems  reasonable - aware o indication0s for return. Pt stable appearing.  Final Clinical Impressions(s) / ED Diagnoses   Final diagnoses:  Pyelonephritis    ED Discharge Orders        Ordered    cephALEXin (KEFLEX) 500 MG capsule  2 times daily     07/30/17 1934    ibuprofen (ADVIL,MOTRIN) 400 MG tablet  Every 6 hours PRN     07/30/17 1934    ondansetron (ZOFRAN ODT) 4 MG disintegrating tablet  Every 8 hours PRN     07/30/17 1934       Noemi Chapel, MD 07/30/17 1936

## 2017-07-30 NOTE — ED Triage Notes (Signed)
Pt states reports chills, dysuria and urinary frequency since Wednesday.

## 2017-07-30 NOTE — ED Notes (Signed)
Pt hooked up to dinamap r/t antibiotic.

## 2017-07-30 NOTE — ED Notes (Signed)
Pt in hallway. Unable to get rectal temp at this time

## 2017-07-30 NOTE — Discharge Instructions (Signed)
Your testing shows that you likely have a kidney infection  We have given you IV fluids and antibiotics Your blood counts were high  See your doctor in 2 days for recheck  ER for more pain, fever or vomiting  Keflex twice daily for 7 days

## 2017-07-30 NOTE — ED Notes (Signed)
Pt alert & oriented x4. Patient given discharge instructions, paperwork & prescription(s). Patient verbalized understanding. Pt left department in wheelchair escorted by staff. Pt left w/ no further questions. 

## 2017-08-02 LAB — URINE CULTURE: Culture: >=100000 — AB

## 2017-08-03 ENCOUNTER — Telehealth: Payer: Self-pay | Admitting: Emergency Medicine

## 2017-08-03 NOTE — Telephone Encounter (Signed)
Post ED Visit - Positive Culture Follow-up  Culture report reviewed by antimicrobial stewardship pharmacist:  []  Elenor Quinones, Pharm.D. []  Heide Guile, Pharm.D., BCPS AQ-ID []  Parks Neptune, Pharm.D., BCPS []  Alycia Rossetti, Pharm.D., BCPS []  [place], Pharm.D., BCPS, AAHIVP [x]  Legrand Como, Pharm.D., BCPS, AAHIVP []  Salome Arnt, PharmD, BCPS []  Jalene Mullet, PharmD []  Vincenza Hews, PharmD, BCPS  Positive urine culture Treated with cephalexin, organism sensitive to the same and no further patient follow-up is required at this time.  Hazle Nordmann 08/03/2017, 2:39 PM

## 2017-08-04 LAB — CULTURE, BLOOD (ROUTINE X 2)
Culture: NO GROWTH
Culture: NO GROWTH
Special Requests: ADEQUATE

## 2017-08-11 ENCOUNTER — Encounter: Payer: Self-pay | Admitting: Hematology

## 2017-08-11 DIAGNOSIS — D46Z Other myelodysplastic syndromes: Secondary | ICD-10-CM | POA: Diagnosis not present

## 2017-08-11 DIAGNOSIS — D469 Myelodysplastic syndrome, unspecified: Secondary | ICD-10-CM | POA: Diagnosis not present

## 2017-08-13 DIAGNOSIS — Z961 Presence of intraocular lens: Secondary | ICD-10-CM | POA: Diagnosis not present

## 2017-08-13 DIAGNOSIS — H43813 Vitreous degeneration, bilateral: Secondary | ICD-10-CM | POA: Diagnosis not present

## 2017-08-13 DIAGNOSIS — H26493 Other secondary cataract, bilateral: Secondary | ICD-10-CM | POA: Diagnosis not present

## 2017-08-13 DIAGNOSIS — H5203 Hypermetropia, bilateral: Secondary | ICD-10-CM | POA: Diagnosis not present

## 2017-08-16 ENCOUNTER — Encounter: Payer: Self-pay | Admitting: Hematology

## 2017-08-16 DIAGNOSIS — Z1389 Encounter for screening for other disorder: Secondary | ICD-10-CM | POA: Diagnosis not present

## 2017-08-16 DIAGNOSIS — Z125 Encounter for screening for malignant neoplasm of prostate: Secondary | ICD-10-CM | POA: Diagnosis not present

## 2017-08-16 DIAGNOSIS — N410 Acute prostatitis: Secondary | ICD-10-CM | POA: Diagnosis not present

## 2017-08-16 DIAGNOSIS — Z681 Body mass index (BMI) 19 or less, adult: Secondary | ICD-10-CM | POA: Diagnosis not present

## 2017-09-16 ENCOUNTER — Emergency Department (HOSPITAL_COMMUNITY)
Admission: EM | Admit: 2017-09-16 | Discharge: 2017-09-16 | Disposition: A | Discharge status: Home / Self Care | Payer: Medicare Other | Attending: Emergency Medicine | Admitting: Emergency Medicine

## 2017-09-16 ENCOUNTER — Other Ambulatory Visit: Payer: Self-pay

## 2017-09-16 ENCOUNTER — Emergency Department (HOSPITAL_COMMUNITY): Payer: Medicare Other

## 2017-09-16 ENCOUNTER — Encounter (HOSPITAL_COMMUNITY): Payer: Self-pay | Admitting: Emergency Medicine

## 2017-09-16 DIAGNOSIS — Z79899 Other long term (current) drug therapy: Secondary | ICD-10-CM | POA: Insufficient documentation

## 2017-09-16 DIAGNOSIS — M5489 Other dorsalgia: Secondary | ICD-10-CM | POA: Diagnosis present

## 2017-09-16 DIAGNOSIS — M545 Low back pain, unspecified: Secondary | ICD-10-CM

## 2017-09-16 DIAGNOSIS — Z87891 Personal history of nicotine dependence: Secondary | ICD-10-CM | POA: Insufficient documentation

## 2017-09-16 MED ORDER — HYDROCODONE-ACETAMINOPHEN 5-325 MG PO TABS: 1.0000 | ORAL_TABLET | ORAL | 0 refills | Status: DC | PRN

## 2017-09-16 NOTE — Discharge Instructions (Addendum)
You may take the medicine prescribed if needed for pain.  This will make you drowsy - do not drive within 4 hours of taking this medication.  If this makes you too drowsy, you can break this medicine in half.  Also, you may continue taking your aleve - try 2 tablets every 12 hours.  Your xrays do not show any sign of a new compression fracture today.

## 2017-09-16 NOTE — ED Provider Notes (Signed)
[facility] EMERGENCY DEPARTMENT Provider Note   CSN: [id number] Arrival date & time: 09/16/17  1658     History   Chief Complaint Chief Complaint  Patient presents with  . Back Pain    HPI [name] is a 84 y.o. male who presents for evaluation of acute onset of pain in his lower back.  He has a history of lumbar compression fractures and while trying to lift his fishing boat out of the water around noon yesterday he felt and heard a sudden popping sensation with intense pain in his midline lower back and is concerned about another compression fracture.  He has taken Aleve prior to arrival with no significant improvement in his symptoms.  He denies weakness or numbness in his legs or feet, denies urinary or fecal incontinence or retention.  Pain is worsened with movement.  It does not radiate, it is midline, but states he cannot reproduce the pain by palpating at the site.  He is symptom-free at rest.  The history is provided by the patient.    Past Medical History:  Diagnosis Date  . Chronic kidney insufficiency     Patient Active Problem List   Diagnosis Date Noted  . Abnormal CT of the abdomen 11/02/2016  . Loss of weight 11/02/2016  . History of colonic polyps 11/02/2016  . GERD (gastroesophageal reflux disease) 10/01/2015    Past Surgical History:  Procedure Laterality Date  . CATARACT EXTRACTION W/PHACO Right 03/09/2016   Procedure: CATARACT EXTRACTION PHACO AND INTRAOCULAR LENS PLACEMENT RIGHT EYE CDE=8.58;  Surgeon: Tonny Branch, MD;  Location: [facility];  Service: Ophthalmology;  Laterality: Right;  right  . CATARACT EXTRACTION W/PHACO Left 04/13/2016   Procedure: CATARACT EXTRACTION PHACO AND INTRAOCULAR LENS PLACEMENT (IOC);  Surgeon: Tonny Branch, MD;  Location: [facility];  Service: Ophthalmology;  Laterality: Left;  CDE: 8.08  . None to Date  10/01/15        Home Medications    Prior to Admission medications   Medication Sig Start Date End Date Taking?  Authorizing Provider  predniSONE (DELTASONE) 10 MG tablet Take 5 mg by mouth daily. 06/12/17  Yes [provider]  tamsulosin (FLOMAX) 0.4 MG CAPS capsule Take 1 capsule by mouth daily. 08/16/17  Yes [provider]  HYDROcodone-acetaminophen (NORCO/VICODIN) 5-325 MG tablet Take 1 tablet by mouth every 4 (four) hours as needed. 09/16/17   Camara Rosander, Almyra Free, PA-C  ibuprofen (ADVIL,MOTRIN) 400 MG tablet Take 1 tablet (400 mg total) by mouth every 6 (six) hours as needed. 07/30/17   Noemi Chapel, MD  ondansetron (ZOFRAN ODT) 4 MG disintegrating tablet Take 1 tablet (4 mg total) by mouth every 8 (eight) hours as needed for nausea. 07/30/17   Noemi Chapel, MD    Family History Family History  Problem Relation Age of Onset  . Kidney disease Mother   . Kidney disease Sister   . Colon cancer Neg Hx   . Gastric cancer Neg Hx   . Esophageal cancer Neg Hx     Social History Social History   Tobacco Use  . Smoking status: Former Smoker    Years: 15.00    Types: Cigarettes, Cigars    Last attempt to quit: 10/01/1990    Years since quitting: 26.9  . Smokeless tobacco: Never Used  . Tobacco comment: Quit x 25-30 years; 2 cigars daily when smoked  Substance Use Topics  . Alcohol use: Yes    Alcohol/week: 0.0 oz    Comment: Glass of wine  1-2 times a week-"sometimes"  . Drug use: No     Allergies   Patient has no known allergies.   Review of Systems Review of Systems  Constitutional: Negative for fever.  Respiratory: Negative for shortness of breath.   Cardiovascular: Negative for chest pain and leg swelling.  Gastrointestinal: Negative for abdominal distention, abdominal pain and constipation.  Genitourinary: Negative for difficulty urinating, dysuria, flank pain, frequency and urgency.  Musculoskeletal: Positive for back pain. Negative for gait problem and joint swelling.  Skin: Negative for rash.  Neurological: Negative for weakness and numbness.     Physical  Exam Updated Vital Signs BP 114/71 (BP Location: Right Arm)   Pulse 71   Temp 97.9 F (36.6 C) (Oral)   Resp 15   Ht 6\' 4"  (1.93 m)   Wt 65.8 kg (145 lb)   SpO2 97%   BMI 17.65 kg/m   Physical Exam  Constitutional: He appears well-developed and well-nourished.  HENT:  Head: Normocephalic.  Eyes: Conjunctivae are normal.  Neck: Normal range of motion. Neck supple.  Cardiovascular: Normal rate and intact distal pulses.  Pedal pulses normal.  Pulmonary/Chest: Effort normal.  Abdominal: Soft. He exhibits no distension and no mass.  Musculoskeletal: Normal range of motion. He exhibits no edema, tenderness or deformity.       Lumbar back: He exhibits no swelling, no edema and no spasm.  Neurological: He is alert. He has normal strength. He displays no atrophy and no tremor. No sensory deficit. Gait normal.  Reflex Scores:      Patellar reflexes are 2+ on the right side and 2+ on the left side. No strength deficit noted in hip and knee flexor and extensor muscle groups.  Ankle flexion and extension intact.  Skin: Skin is warm and dry.  Psychiatric: He has a normal mood and affect.  Nursing note and vitals reviewed.    ED Treatments / Results  Labs (all labs ordered are listed, but only abnormal results are displayed) Labs Reviewed - No data to display  EKG None  Radiology Dg Lumbar Spine Complete  Result Date: 09/16/2017 CLINICAL DATA:  Patient states he was lifting something when he felt immediate pain coming from his back yesterday. Pain is across his lower back, consistent, and he is having difficulty walking. EXAM: LUMBAR SPINE - COMPLETE 4+ VIEW COMPARISON:  CT of the abdomen and pelvis on 09/23/2016 FINDINGS: There are chronic superior endplate fractures of L2 and L3. Chronic wedge deformity of L4 and L5. No acute fracture or traumatic subluxation identified. No suspicious lytic or blastic lesions are identified. There is dense atherosclerotic calcification of the aorta.  Within the anterior RIGHT UPPER QUADRANT, at the level of L3-4, there is an 11 x 3 millimeter metallic foreign body, of uncertain significance IMPRESSION: 1. Stable appearance of the lumbar spine with numerous remote superior endplate fractures and wedge compression fractures. 2.  No evidence for acute  abnormality. 3. Small metallic density in the RIGHT UPPER QUADRANT of uncertain significance, possibly representing ingested foreign body. Electronically Signed   By: Nolon Nations M.D.   On: 09/16/2017 18:38    Procedures Procedures (including critical care time)  Medications Ordered in ED Medications - No data to display   Initial Impression / Assessment and Plan / ED Course  I have reviewed the triage vital signs and the nursing notes.  Pertinent labs & imaging results that were available during my care of the patient were reviewed by me and considered in my  medical decision making (see chart for details).     Imaging reviewed and discussed with patient, including the metallic foreign body in his right upper quadrant, patient has no idea where this may have originated.  He denies abdominal pain.  Discussed home treatment for his low back pain including increasing his Aleve to 2 tablets twice daily.  Prescribed hydrocodone advising 1/2 to 1 tablet every 4 hours as needed pain.  Heat therapy.  Follow-up with his PCP if symptoms are not improving over the next week.  No neuro deficit on exam or by history to suggest emergent or surgical presentation.  Also discussed worsened sx that should prompt immediate re-evaluation including distal weakness, bowel/bladder retention/incontinence.  Patient discussed with Dr. Wilson Singer prior to discharge home.       Final Clinical Impressions(s) / ED Diagnoses   Final diagnoses:  Acute midline low back pain without sciatica    ED Discharge Orders        Ordered    HYDROcodone-acetaminophen (NORCO/VICODIN) 5-325 MG tablet  Every 4 hours PRN      09/16/17 1917       Evalee Jefferson, Hershal Coria 09/16/17 1939    Virgel Manifold, MD 09/16/17 2250

## 2017-09-16 NOTE — ED Triage Notes (Signed)
PT states he was loading his boat on the trailer after fishing yesterday and felt a pop in his lower back. PT c/o lower back pain with no radiation of pain. PT denies any bowel or bladder problems and is ambulatory in triage.

## 2017-09-17 ENCOUNTER — Encounter (HOSPITAL_COMMUNITY): Payer: Self-pay | Admitting: Hematology

## 2017-09-17 ENCOUNTER — Inpatient Hospital Stay (HOSPITAL_COMMUNITY): Payer: Medicare Other

## 2017-09-17 ENCOUNTER — Inpatient Hospital Stay (HOSPITAL_COMMUNITY): Payer: Medicare Other | Attending: Hematology | Admitting: Hematology

## 2017-09-17 ENCOUNTER — Other Ambulatory Visit: Payer: Self-pay

## 2017-09-17 VITALS — BP 123/62 | HR 75 | Temp 97.7°F | Resp 18 | Ht 76.0 in | Wt 141.1 lb

## 2017-09-17 DIAGNOSIS — N183 Chronic kidney disease, stage 3 (moderate): Secondary | ICD-10-CM | POA: Diagnosis not present

## 2017-09-17 DIAGNOSIS — I129 Hypertensive chronic kidney disease with stage 1 through stage 4 chronic kidney disease, or unspecified chronic kidney disease: Secondary | ICD-10-CM | POA: Insufficient documentation

## 2017-09-17 DIAGNOSIS — Z86010 Personal history of colonic polyps: Secondary | ICD-10-CM | POA: Diagnosis not present

## 2017-09-17 DIAGNOSIS — Z79899 Other long term (current) drug therapy: Secondary | ICD-10-CM | POA: Insufficient documentation

## 2017-09-17 DIAGNOSIS — Z87891 Personal history of nicotine dependence: Secondary | ICD-10-CM | POA: Insufficient documentation

## 2017-09-17 DIAGNOSIS — I700 Atherosclerosis of aorta: Secondary | ICD-10-CM | POA: Diagnosis not present

## 2017-09-17 DIAGNOSIS — D649 Anemia, unspecified: Secondary | ICD-10-CM

## 2017-09-17 LAB — COMPREHENSIVE METABOLIC PANEL
ALT: 13 U/L — ABNORMAL LOW (ref 17–63)
AST: 17 U/L (ref 15–41)
Albumin: 4.5 g/dL (ref 3.5–5.0)
Alkaline Phosphatase: 68 U/L (ref 38–126)
Anion gap: 11 (ref 5–15)
BUN: 23 mg/dL — ABNORMAL HIGH (ref 6–20)
CO2: 19 mmol/L — ABNORMAL LOW (ref 22–32)
Calcium: 9.2 mg/dL (ref 8.9–10.3)
Chloride: 105 mmol/L (ref 101–111)
Creatinine, Ser: 1.96 mg/dL — ABNORMAL HIGH (ref 0.61–1.24)
GFR calc Af Amer: 34 mL/min — ABNORMAL LOW (ref >60)
GFR calc non Af Amer: 29 mL/min — ABNORMAL LOW (ref >60)
Glucose, Bld: 107 mg/dL — ABNORMAL HIGH (ref 65–99)
Potassium: 4.5 mmol/L (ref 3.5–5.1)
Sodium: 135 mmol/L (ref 135–145)
Total Bilirubin: 1 mg/dL (ref 0.3–1.2)
Total Protein: 7.5 g/dL (ref 6.5–8.1)

## 2017-09-17 LAB — IRON AND TIBC
Iron: 67 ug/dL (ref 45–182)
Saturation Ratios: 27 % (ref 17.9–39.5)
TIBC: 252 ug/dL (ref 250–450)
UIBC: 185 ug/dL

## 2017-09-17 LAB — CBC WITH DIFFERENTIAL/PLATELET
Basophils Absolute: 0 10*3/uL (ref 0.0–0.1)
Basophils Relative: 0 %
Eosinophils Absolute: 0 10*3/uL (ref 0.0–0.7)
Eosinophils Relative: 0 %
HCT: 33.5 % — ABNORMAL LOW (ref 39.0–52.0)
Hemoglobin: 10.9 g/dL — ABNORMAL LOW (ref 13.0–17.0)
Lymphocytes Relative: 13 %
Lymphs Abs: 0.5 10*3/uL — ABNORMAL LOW (ref 0.7–4.0)
MCH: 27.9 pg (ref 26.0–34.0)
MCHC: 32.5 g/dL (ref 30.0–36.0)
MCV: 85.9 fL (ref 78.0–100.0)
Monocytes Absolute: 0.5 10*3/uL (ref 0.1–1.0)
Monocytes Relative: 12 %
Neutro Abs: 2.9 10*3/uL (ref 1.7–7.7)
Neutrophils Relative %: 75 %
Platelets: 106 10*3/uL — ABNORMAL LOW (ref 150–400)
RBC: 3.9 MIL/uL — ABNORMAL LOW (ref 4.22–5.81)
RDW: 21.4 % — ABNORMAL HIGH (ref 11.5–15.5)
WBC: 3.9 10*3/uL — ABNORMAL LOW (ref 4.0–10.5)

## 2017-09-17 LAB — FOLATE: Folate: 7.6 ng/mL (ref >5.9)

## 2017-09-17 LAB — RETICULOCYTES
RBC.: 3.9 MIL/uL — ABNORMAL LOW (ref 4.22–5.81)
Retic Count, Absolute: 58.5 10*3/uL (ref 19.0–186.0)
Retic Ct Pct: 1.5 % (ref 0.4–3.1)

## 2017-09-17 LAB — FERRITIN: Ferritin: 354 ng/mL — ABNORMAL HIGH (ref 24–336)

## 2017-09-17 LAB — LACTATE DEHYDROGENASE: LDH: 193 U/L — ABNORMAL HIGH (ref 98–192)

## 2017-09-17 LAB — VITAMIN B12: Vitamin B-12: 495 pg/mL (ref 180–914)

## 2017-09-17 NOTE — Patient Instructions (Signed)
[facility] at [facility] Discharge Instructions  Today you saw Dr. K.   Thank you for choosing [facility] at [facility] to provide your oncology and hematology care.  To afford each patient quality time with our provider, please arrive at least 15 minutes before your scheduled appointment time.   If you have a lab appointment with the Cancer Center please come in thru the  Main Entrance and check in at the main information desk  You need to re-schedule your appointment should you arrive 10 or more minutes late.  We strive to give you quality time with our providers, and arriving late affects you and other patients whose appointments are after yours.  Also, if you no show three or more times for appointments you may be dismissed from the clinic at the providers discretion.     Again, thank you for choosing [facility].  Our hope is that these requests will decrease the amount of time that you wait before being seen by our physicians.       _____________________________________________________________  Should you have questions after your visit to [facility], please contact our office at [phone] between the hours of 8:30 a.m. and 4:30 p.m.  Voicemails left after 4:30 p.m. will not be returned until the following business day.  For prescription refill requests, have your pharmacy contact our office.       Resources For Cancer Patients and their Caregivers ? American Cancer Society: Can assist with transportation, wigs, general needs, runs Look Good Feel Better.        1-888-227-6333 ? Cancer Care: Provides financial assistance, online support groups, medication/co-pay assistance.  1-800-813-HOPE (4673) ? [facility] Assists [place] cancer patients and their families through emotional , educational and financial support.  336-427-4357 ? [place] DSS Where to apply for food  stamps, Medicaid and utility assistance. 336-342-1394 ? [facility]: Transportation to medical appointments. 336-347-2287 ? Social Security Administration: May apply for disability if have a Stage IV cancer. 336-342-7796 1-800-772-1213 ? [place] Aging, Disability and Transit Services: Assists with nutrition, care and transit needs. 336-349-2343  Cancer Center Support Programs:   > Cancer Support Group  2nd Tuesday of the month 1pm-2pm, Journey Room   > Creative Journey  3rd Tuesday of the month 1130am-1pm, Journey Room    

## 2017-09-17 NOTE — Assessment & Plan Note (Signed)
1.  Normocytic anemia: - His most recent CBC on 08/11/2017 shows hemoglobin 9.7, prior to that it was 11.1 on 07/30/2017. -Most likely etiology is anemia from chronic kidney disease and relative iron deficiency state.  Would repeat CBC, LDH, reticulocyte count, ferritin, iron panel, Z06, folic acid, SPEP.  Patient reportedly had colonoscopy and EGD in [place] sometime after August 2018.  He was also evaluated by hematologist (Dr. Jacelyn Grip) in [facility].  He reportedly had a bone marrow biopsy.  We will obtain those reports.  He was also receiving monthly injections since August through last month in [place].  He denies any bleeding per rectum or melena.  We will see him back in 2 to 3 weeks to discuss the results and start him on erythropoiesis simulating agents as well as parenteral iron if needed.  2.  CKD: - Stable between 1.5-2.0.

## 2017-09-17 NOTE — Progress Notes (Signed)
[place]  CONSULT NOTE  Patient Care Team: Sharilyn Sites, MD as PCP - General (Family Medicine) Gala Romney Cristopher Estimable, MD as Consulting Physician (Gastroenterology)  CHIEF COMPLAINTS/PURPOSE OF CONSULTATION:  Anemia   HISTORY OF PRESENTING ILLNESS:  [name] 84 y.o. male is here for initial consultation for anemia; referred by his PCP Dr. Nancy Nordmann.    Past medical history significant for HTN, osteoporosis, colon polyps, & diverticulosis.  He had recent ED visit on 07/30/17 for pyelonephritis. Hgb at that time 11.1 g/dL, plts 97,000, and WBCs 15.3.   He was seen by [facility] in [place], [place] on 08/12/17.  Labs at that time revealed hemoglobin 9.7, hematocrit 28.6, MCV/MCH/MCHC normal, WBCs 3.9 with elevated monocytes 19%, platelets 220,000.  CBC differential noted to have ovalocytes, and leukocytosis, & poikilocytosis.  CMET done at that time and significant for mildly elevated creatinine 1.39 with eGFR 46. Otherwise, CMET was within normal limits.    He has been evaluated at [facility] in the past by Dr. Edrick Oh; last office visit reviewed from 02/19/17.  Noted to have documented CKD, stage 3 at that time with creatinine 1.7 secondary to nephrosclerosis.     Historic labs from PCP's office reviewed and showed the following results:  Date 11/23/16 10/21/16 09/09/15 09/05/15  Hgb 9.1 9.3 9.9 10.0  Hct 27.7 27.3 29.2 28.6  Plts 302 220 227 273  Ferritin  765 457   Iron  65 86   TIBC  220 239   % sat  30 36   Folate  11.9 13.1   Vitamin B12  664 579     States that he was "getting shots" for the past few months in [place]; he thinks injections were started in 12/2016.    Denies fever, night sweats, or unintentional weight loss in past 6 months.  Denies any frank bleeding episodes including blood in his stools or hematuria.  He thinks he had colonoscopy evaluation in [place].  He thinks he had bone marrow biopsy sometime last Fall  as well.    No h/o blood transfusions that he is aware of. No h/o iron infusions that he is aware of as well.   Oncologic family history:  -Brother: pancreatic cancer    MEDICAL HISTORY:  Past Medical History:  Diagnosis Date  . Chronic kidney insufficiency     SURGICAL HISTORY: Past Surgical History:  Procedure Laterality Date  . CATARACT EXTRACTION W/PHACO Right 03/09/2016   Procedure: CATARACT EXTRACTION PHACO AND INTRAOCULAR LENS PLACEMENT RIGHT EYE CDE=8.58;  Surgeon: Tonny Branch, MD;  Location: [facility];  Service: Ophthalmology;  Laterality: Right;  right  . CATARACT EXTRACTION W/PHACO Left 04/13/2016   Procedure: CATARACT EXTRACTION PHACO AND INTRAOCULAR LENS PLACEMENT (IOC);  Surgeon: Tonny Branch, MD;  Location: [facility];  Service: Ophthalmology;  Laterality: Left;  CDE: 8.08  . None to Date  10/01/15    SOCIAL HISTORY: Social History   Socioeconomic History  . Marital status: Widowed    Spouse name: Not on file  . Number of children: Not on file  . Years of education: Not on file  . Highest education level: Not on file  Occupational History  . Not on file  Social Needs  . Financial resource strain: Not on file  . Food insecurity:    Worry: Not on file    Inability: Not on file  . Transportation needs:    Medical: Not on file    Non-medical: Not on  file  Tobacco Use  . Smoking status: Former Smoker    Years: 15.00    Types: Cigarettes, Cigars    Last attempt to quit: 10/01/1990    Years since quitting: 26.9  . Smokeless tobacco: Never Used  . Tobacco comment: Quit x 25-30 years; 2 cigars daily when smoked  Substance and Sexual Activity  . Alcohol use: Yes    Alcohol/week: 0.0 oz    Comment: Glass of wine 1-2 times a week-"sometimes"  . Drug use: No  . Sexual activity: Never    Birth control/protection: None  Lifestyle  . Physical activity:    Days per week: Not on file    Minutes per session: Not on file  . Stress: Not on file  Relationships  . Social  connections:    Talks on phone: Not on file    Gets together: Not on file    Attends religious service: Not on file    Active member of club or organization: Not on file    Attends meetings of clubs or organizations: Not on file    Relationship status: Not on file  . Intimate partner violence:    Fear of current or ex partner: Not on file    Emotionally abused: Not on file    Physically abused: Not on file    Forced sexual activity: Not on file  Other Topics Concern  . Not on file  Social History Narrative  . Not on file    FAMILY HISTORY: Family History  Problem Relation Age of Onset  . Kidney disease Mother   . Kidney disease Sister   . Colon cancer Neg Hx   . Gastric cancer Neg Hx   . Esophageal cancer Neg Hx     ALLERGIES:  has No Known Allergies.  MEDICATIONS:  Current Outpatient Medications  Medication Sig Dispense Refill  . HYDROcodone-acetaminophen (NORCO/VICODIN) 5-325 MG tablet Take 1 tablet by mouth every 4 (four) hours as needed. 15 tablet 0  . ibuprofen (ADVIL,MOTRIN) 400 MG tablet Take 1 tablet (400 mg total) by mouth every 6 (six) hours as needed. (Patient not taking: Reported on 09/17/2017) 30 tablet 0  . ondansetron (ZOFRAN ODT) 4 MG disintegrating tablet Take 1 tablet (4 mg total) by mouth every 8 (eight) hours as needed for nausea. (Patient not taking: Reported on 09/17/2017) 10 tablet 0  . predniSONE (DELTASONE) 10 MG tablet Take 5 mg by mouth daily.     No current facility-administered medications for this visit.     REVIEW OF SYSTEMS:   Constitutional: Denies fevers, chills or abnormal night sweats Eyes: Denies blurriness of vision, double vision or watery eyes Ears, nose, mouth, throat, and face: Denies mucositis or sore throat Respiratory: Denies cough, dyspnea or wheezes Cardiovascular: Denies palpitation, chest discomfort or lower extremity swelling Gastrointestinal:  Denies nausea, heartburn or change in bowel habits Skin: Denies abnormal skin  rashes Lymphatics: Denies new lymphadenopathy or easy bruising Neurological:Denies numbness, tingling or new weaknesses.  Complains of back pain worse in the last 2 days. Behavioral/Psych: Mood is stable, no new changes  All other systems were reviewed with the patient and are negative.  PHYSICAL EXAMINATION: ECOG PERFORMANCE STATUS: 1 - Symptomatic but completely ambulatory  Vitals:   09/17/17 1259  BP: 123/62  Pulse: 75  Resp: 18  Temp: 97.7 F (36.5 C)  SpO2: 96%   Filed Weights   09/17/17 1259  Weight: 141 lb 1.6 oz (64 kg)    GENERAL:alert, no distress and comfortable  SKIN: skin color, texture, turgor are normal, no rashes or significant lesions EYES: normal, conjunctiva are pink and non-injected, sclera clear OROPHARYNX:no exudate, no erythema and lips, buccal mucosa, and tongue normal  NECK: supple, thyroid normal size, non-tender, without nodularity LYMPH:  no palpable lymphadenopathy in the cervical, axillary or inguinal LUNGS: clear to auscultation and percussion with normal breathing effort HEART: regular rate & rhythm and no murmurs and no lower extremity edema ABDOMEN:abdomen soft, non-tender and normal bowel sounds Musculoskeletal:no cyanosis of digits and no clubbing  PSYCH: alert & oriented x 3 with fluent speech   LABORATORY DATA:  I have reviewed the data as listed Recent Results (from the past 2160 hour(s))  Urinalysis, Routine w reflex microscopic     Status: Abnormal   Collection Time: 07/30/17 12:33 PM  Result Value Ref Range   Color, Urine AMBER (A) YELLOW    Comment: BIOCHEMICALS MAY BE AFFECTED BY COLOR   APPearance CLOUDY (A) CLEAR   Specific Gravity, Urine 1.017 1.005 - 1.030   pH 5.0 5.0 - 8.0   Glucose, UA NEGATIVE NEGATIVE mg/dL   Hgb urine dipstick MODERATE (A) NEGATIVE   Bilirubin Urine NEGATIVE NEGATIVE   Ketones, ur 20 (A) NEGATIVE mg/dL   Protein, ur 30 (A) NEGATIVE mg/dL   Nitrite POSITIVE (A) NEGATIVE   Leukocytes, UA LARGE (A)  NEGATIVE   RBC / HPF 6-30 0 - 5 RBC/hpf   WBC, UA TOO NUMEROUS TO COUNT 0 - 5 WBC/hpf   Bacteria, UA MANY (A) NONE SEEN   Squamous Epithelial / LPF 0-5 (A) NONE SEEN   WBC Clumps PRESENT    Mucus PRESENT    Non Squamous Epithelial 0-5 (A) NONE SEEN    Comment: Performed at [facility], [address], [place], [place] [address]  CBC with Differential     Status: Abnormal   Collection Time: 07/30/17 12:45 PM  Result Value Ref Range   WBC 15.3 (H) 4.0 - 10.5 K/uL   RBC 4.00 (L) 4.22 - 5.81 MIL/uL   Hemoglobin 11.1 (L) 13.0 - 17.0 g/dL   HCT 34.2 (L) 39.0 - 52.0 %   MCV 85.5 78.0 - 100.0 fL   MCH 27.8 26.0 - 34.0 pg   MCHC 32.5 30.0 - 36.0 g/dL   RDW 19.7 (H) 11.5 - 15.5 %   Platelets 97 (L) 150 - 400 K/uL    Comment: PLATELET COUNT CONFIRMED BY SMEAR SPECIMEN CHECKED FOR CLOTS    Neutrophils Relative % 79 %   Neutro Abs 12.1 1.7 - 7.7 K/uL   Lymphocytes Relative 13 %   Lymphs Abs 2.0 0.7 - 4.0 K/uL   Monocytes Relative 8 %   Monocytes Absolute 1.2 0.1 - 1.0 K/uL   Eosinophils Relative 0 %   Eosinophils Absolute 0.0 0.0 - 0.7 K/uL   Basophils Relative 0 %   Basophils Absolute 0.1 0.0 - 0.1 K/uL   RBC Morphology Schistocytes present     Comment: Performed at [facility], [address] [place], [place] [address]  Comprehensive metabolic panel     Status: Abnormal   Collection Time: 07/30/17 12:45 PM  Result Value Ref Range   Sodium 137 135 - 145 mmol/L   Potassium 3.9 3.5 - 5.1 mmol/L   Chloride 102 101 - 111 mmol/L   CO2 21 (L) 22 - 32 mmol/L   Glucose, Bld 91 65 - 99 mg/dL   BUN 26 (H) 6 - 20 mg/dL   Creatinine, Ser 1.74 (H) 0.61 -  1.24 mg/dL   Calcium 9.2 8.9 - 10.3 mg/dL   Total Protein 7.2 6.5 - 8.1 g/dL   Albumin 3.9 3.5 - 5.0 g/dL   AST 14 (L) 15 - 41 U/L   ALT 10 (L) 17 - 63 U/L   Alkaline Phosphatase 71 38 - 126 U/L   Total Bilirubin 2.0 (H) 0.3 - 1.2 mg/dL   GFR calc non Af Amer 34 (L) >60 mL/min   GFR calc Af Amer 39 (L) >60 mL/min    Comment:  (NOTE) The eGFR has been calculated using the CKD EPI equation. This calculation has not been validated in all clinical situations. eGFR's persistently <60 mL/min signify possible Chronic Kidney Disease.    Anion gap 14 5 - 15    Comment: Performed at [facility], [address] [place], [place] [address]  Urine Culture     Status: Abnormal   Collection Time: 07/30/17  2:25 PM  Result Value Ref Range   Specimen Description      URINE, CLEAN CATCH Performed at [facility], [address] [place], [place] [address]    Special Requests      NONE Performed at [facility], [address] [place], [place] [address]    Culture >=100,000 COLONIES/mL ESCHERICHIA COLI (A)    Report Status 08/02/2017 FINAL    Organism ID, Bacteria ESCHERICHIA COLI (A)       Susceptibility   Escherichia coli - MIC*    AMPICILLIN <=2 SENSITIVE Sensitive     CEFAZOLIN <=4 SENSITIVE Sensitive     CEFTRIAXONE <=1 SENSITIVE Sensitive     CIPROFLOXACIN <=0.25 SENSITIVE Sensitive     GENTAMICIN <=1 SENSITIVE Sensitive     IMIPENEM <=0.25 SENSITIVE Sensitive     NITROFURANTOIN <=16 SENSITIVE Sensitive     TRIMETH/SULFA <=20 SENSITIVE Sensitive     AMPICILLIN/SULBACTAM <=2 SENSITIVE Sensitive     PIP/TAZO <=4 SENSITIVE Sensitive     Extended ESBL NEGATIVE Sensitive     * >=100,000 COLONIES/mL ESCHERICHIA COLI  Blood culture (routine x 2)     Status: None   Collection Time: 07/30/17  3:06 PM  Result Value Ref Range   Specimen Description BLOOD BLOOD LEFT WRIST    Special Requests      BOTTLES DRAWN AEROBIC ONLY Blood Culture adequate volume   Culture      NO GROWTH 5 DAYS Performed at [facility], [address] [place], [place] [address]    Report Status 08/04/2017 FINAL   Blood culture (routine x 2)     Status: None   Collection Time: 07/30/17  3:06 PM  Result Value Ref Range   Specimen Description BLOOD LEFT ANTECUBITAL    Special Requests      BOTTLES DRAWN AEROBIC ONLY Blood Culture results may  not be optimal due to an inadequate volume of blood received in culture bottles   Culture      NO GROWTH 5 DAYS Performed at [facility], [address] [place], [place] [address]    Report Status 08/04/2017 FINAL   I-Stat CG4 Lactic Acid, ED     Status: None   Collection Time: 07/30/17  4:45 PM  Result Value Ref Range   Lactic Acid, Venous 0.98 0.5 - 1.9 mmol/L    RADIOGRAPHIC STUDIES: I have personally reviewed the radiological images as listed and agreed with the findings in the report. Dg Lumbar Spine Complete  Result Date: 09/16/2017 CLINICAL DATA:  Patient states he was lifting something when he felt  immediate pain coming from his back yesterday. Pain is across his lower back, consistent, and he is having difficulty walking. EXAM: LUMBAR SPINE - COMPLETE 4+ VIEW COMPARISON:  CT of the abdomen and pelvis on 09/23/2016 FINDINGS: There are chronic superior endplate fractures of L2 and L3. Chronic wedge deformity of L4 and L5. No acute fracture or traumatic subluxation identified. No suspicious lytic or blastic lesions are identified. There is dense atherosclerotic calcification of the aorta. Within the anterior RIGHT UPPER QUADRANT, at the level of L3-4, there is an 11 x 3 millimeter metallic foreign body, of uncertain significance IMPRESSION: 1. Stable appearance of the lumbar spine with numerous remote superior endplate fractures and wedge compression fractures. 2.  No evidence for acute  abnormality. 3. Small metallic density in the RIGHT UPPER QUADRANT of uncertain significance, possibly representing ingested foreign body. Electronically Signed   By: Nolon Nations M.D.   On: 09/16/2017 18:38    ASSESSMENT & PLAN:  Anemia 1.  Normocytic anemia: - His most recent CBC on 08/11/2017 shows hemoglobin 9.7, prior to that it was 11.1 on 07/30/2017. -Most likely etiology is anemia from chronic kidney disease and relative iron deficiency state.  Would repeat CBC, LDH, reticulocyte count,  ferritin, iron panel, K44, folic acid, SPEP.  Patient reportedly had colonoscopy and EGD in [place] sometime after August 2018.  He was also evaluated by hematologist (Dr. Jacelyn Grip) in [facility].  He reportedly had a bone marrow biopsy.  We will obtain those reports.  He was also receiving monthly injections since August through last month in [place].  He denies any bleeding per rectum or melena.  We will see him back in 2 to 3 weeks to discuss the results and start him on erythropoiesis simulating agents as well as parenteral iron if needed.  2.  CKD: - Stable between 1.5-2.0.    All questions were answered. The patient knows to call the clinic with any problems, questions or concerns.    This note includes documentation from Mike Craze, NP, who was present during this patient's office visit and evaluation.  I have reviewed this note for its completeness and accuracy.  I have edited this note accordingly based on my findings and medical opinion.      Derek Jack, MD 09/17/17 1:34 PM

## 2017-09-18 LAB — ERYTHROPOIETIN: Erythropoietin: 18.8 m[IU]/mL — ABNORMAL HIGH (ref 2.6–18.5)

## 2017-09-20 LAB — KAPPA/LAMBDA LIGHT CHAINS
Kappa free light chain: 35 mg/L — ABNORMAL HIGH (ref 3.3–19.4)
Kappa, lambda light chain ratio: 1.27 (ref 0.26–1.65)
Lambda free light chains: 27.5 mg/L — ABNORMAL HIGH (ref 5.7–26.3)

## 2017-09-21 LAB — MULTIPLE MYELOMA PANEL, SERUM
Albumin SerPl Elph-Mcnc: 4.3 g/dL (ref 2.9–4.4)
Albumin/Glob SerPl: 1.6 (ref 0.7–1.7)
Alpha 1: 0.2 g/dL (ref 0.0–0.4)
Alpha2 Glob SerPl Elph-Mcnc: 0.6 g/dL (ref 0.4–1.0)
B-Globulin SerPl Elph-Mcnc: 0.8 g/dL (ref 0.7–1.3)
Gamma Glob SerPl Elph-Mcnc: 1.1 g/dL (ref 0.4–1.8)
Globulin, Total: 2.7 g/dL (ref 2.2–3.9)
IgA: 237 mg/dL (ref 61–437)
IgG (Immunoglobin G), Serum: 993 mg/dL (ref 700–1600)
IgM (Immunoglobulin M), Srm: 220 mg/dL — ABNORMAL HIGH (ref 15–143)
Total Protein ELP: 7 g/dL (ref 6.0–8.5)

## 2017-10-06 ENCOUNTER — Other Ambulatory Visit (HOSPITAL_COMMUNITY): Payer: Self-pay

## 2017-10-06 DIAGNOSIS — D649 Anemia, unspecified: Secondary | ICD-10-CM

## 2017-10-06 DIAGNOSIS — C9000 Multiple myeloma not having achieved remission: Secondary | ICD-10-CM

## 2017-10-06 DIAGNOSIS — D500 Iron deficiency anemia secondary to blood loss (chronic): Secondary | ICD-10-CM

## 2017-10-07 ENCOUNTER — Inpatient Hospital Stay (HOSPITAL_BASED_OUTPATIENT_CLINIC_OR_DEPARTMENT_OTHER): Payer: Medicare Other | Admitting: Hematology

## 2017-10-07 ENCOUNTER — Inpatient Hospital Stay (HOSPITAL_COMMUNITY): Payer: Medicare Other

## 2017-10-07 ENCOUNTER — Other Ambulatory Visit: Payer: Self-pay

## 2017-10-07 ENCOUNTER — Encounter (HOSPITAL_COMMUNITY): Payer: Self-pay | Admitting: Hematology

## 2017-10-07 DIAGNOSIS — D649 Anemia, unspecified: Secondary | ICD-10-CM

## 2017-10-07 DIAGNOSIS — D500 Iron deficiency anemia secondary to blood loss (chronic): Secondary | ICD-10-CM

## 2017-10-07 DIAGNOSIS — C9000 Multiple myeloma not having achieved remission: Secondary | ICD-10-CM

## 2017-10-07 NOTE — Patient Instructions (Signed)
[facility] Discharge Instructions  Today you saw Dr. K.   Thank you for choosing [facility] to provide your oncology and hematology care.  To afford each patient quality time with our provider, please arrive at least 15 minutes before your scheduled appointment time.   If you have a lab appointment with the Cancer Center please come in thru the  Main Entrance and check in at the main information desk  You need to re-schedule your appointment should you arrive 10 or more minutes late.  We strive to give you quality time with our providers, and arriving late affects you and other patients whose appointments are after yours.  Also, if you no show three or more times for appointments you may be dismissed from the clinic at the providers discretion.     Again, thank you for choosing [facility].  Our hope is that these requests will decrease the amount of time that you wait before being seen by our physicians.       _____________________________________________________________  Should you have questions after your visit to [facility], please contact our office at [phone] between the hours of 8:30 a.m. and 4:30 p.m.  Voicemails left after 4:30 p.m. will not be returned until the following business day.  For prescription refill requests, have your pharmacy contact our office.       Resources For Cancer Patients and their Caregivers ? American Cancer Society: Can assist with transportation, wigs, general needs, runs Look Good Feel Better.        1-888-227-6333 ? Cancer Care: Provides financial assistance, online support groups, medication/co-pay assistance.  1-800-813-HOPE (4673) ? [facility] Assists [place] cancer patients and their families through emotional , educational and financial support.  336-427-4357 ? [place] DSS Where to apply for food  stamps, Medicaid and utility assistance. 336-342-1394 ? [facility]: Transportation to medical appointments. 336-347-2287 ? Social Security Administration: May apply for disability if have a Stage IV cancer. 336-342-7796 1-800-772-1213 ? [place] Aging, Disability and Transit Services: Assists with nutrition, care and transit needs. 336-349-2343  Cancer Center Support Programs:   > Cancer Support Group  2nd Tuesday of the month 1pm-2pm, Journey Room   > Creative Journey  3rd Tuesday of the month 1130am-1pm, Journey Room    

## 2017-10-07 NOTE — Progress Notes (Signed)
Patient left clinic without being seen or evaluated.

## 2017-10-07 NOTE — Assessment & Plan Note (Signed)
1.  Normocytic anemia: - Bone marrow biopsy on 12/08/2016 in [place], [place] showed normocellular marrow with trilineage hematopoiesis with erythroid predominance and dysmegakaryopoiesis.  Blasts were not increased.  Cytogenetics showed 46, XY,del(12)(p.2p13)[8]/46,XY[12].  FISH was positive for 12p-. 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 - CBC on 09/17/2017 shows mild leukopenia with white count of 3.9 and mild thrombocytopenia with platelet count 106.  Hemoglobin was 10.9. -We will continue to monitor his CBC closely and give Procrit injections appropriately.  His last ferritin was 354 and percent saturation was 27.

## 2017-10-13 ENCOUNTER — Ambulatory Visit (HOSPITAL_COMMUNITY): Payer: Medicare Other | Admitting: Hematology

## 2017-11-03 DIAGNOSIS — D46Z Other myelodysplastic syndromes: Secondary | ICD-10-CM | POA: Diagnosis not present

## 2017-11-03 DIAGNOSIS — D469 Myelodysplastic syndrome, unspecified: Secondary | ICD-10-CM | POA: Diagnosis not present

## 2017-12-08 ENCOUNTER — Emergency Department (HOSPITAL_COMMUNITY): Payer: Medicare Other

## 2017-12-08 ENCOUNTER — Other Ambulatory Visit: Payer: Self-pay

## 2017-12-08 ENCOUNTER — Emergency Department (HOSPITAL_COMMUNITY)
Admission: EM | Admit: 2017-12-08 | Discharge: 2017-12-08 | Disposition: A | Discharge status: Home / Self Care | Payer: Medicare Other | Attending: Emergency Medicine | Admitting: Emergency Medicine

## 2017-12-08 ENCOUNTER — Encounter (HOSPITAL_COMMUNITY): Payer: Self-pay | Admitting: *Deleted

## 2017-12-08 DIAGNOSIS — J40 Bronchitis, not specified as acute or chronic: Secondary | ICD-10-CM | POA: Insufficient documentation

## 2017-12-08 DIAGNOSIS — Z87891 Personal history of nicotine dependence: Secondary | ICD-10-CM | POA: Insufficient documentation

## 2017-12-08 DIAGNOSIS — Z859 Personal history of malignant neoplasm, unspecified: Secondary | ICD-10-CM | POA: Insufficient documentation

## 2017-12-08 DIAGNOSIS — R079 Chest pain, unspecified: Secondary | ICD-10-CM | POA: Diagnosis not present

## 2017-12-08 LAB — CBC
HCT: 34.1 % — ABNORMAL LOW (ref 39.0–52.0)
Hemoglobin: 11 g/dL — ABNORMAL LOW (ref 13.0–17.0)
MCH: 27.5 pg (ref 26.0–34.0)
MCHC: 32.3 g/dL (ref 30.0–36.0)
MCV: 85.3 fL (ref 78.0–100.0)
Platelets: 113 10*3/uL — ABNORMAL LOW (ref 150–400)
RBC: 4 MIL/uL — ABNORMAL LOW (ref 4.22–5.81)
RDW: 20 % — ABNORMAL HIGH (ref 11.5–15.5)
WBC: 5.3 10*3/uL (ref 4.0–10.5)

## 2017-12-08 LAB — BASIC METABOLIC PANEL
Anion gap: 8 (ref 5–15)
BUN: 26 mg/dL — ABNORMAL HIGH (ref 8–23)
CO2: 21 mmol/L — ABNORMAL LOW (ref 22–32)
Calcium: 9.1 mg/dL (ref 8.9–10.3)
Chloride: 109 mmol/L (ref 98–111)
Creatinine, Ser: 1.46 mg/dL — ABNORMAL HIGH (ref 0.61–1.24)
GFR calc Af Amer: 48 mL/min — ABNORMAL LOW (ref >60)
GFR calc non Af Amer: 42 mL/min — ABNORMAL LOW (ref >60)
Glucose, Bld: 102 mg/dL — ABNORMAL HIGH (ref 70–99)
Potassium: 4.4 mmol/L (ref 3.5–5.1)
Sodium: 138 mmol/L (ref 135–145)

## 2017-12-08 LAB — TROPONIN I: Troponin I: <0.03 ng/mL (ref <0.03)

## 2017-12-08 MED ORDER — AMOXICILLIN-POT CLAVULANATE 875-125 MG PO TABS: 1.0000 | ORAL_TABLET | Freq: Two times a day (BID) | ORAL | 0 refills | Status: DC

## 2017-12-08 MED ORDER — PREDNISONE 20 MG PO TABS: 20.0000 mg | ORAL_TABLET | Freq: Two times a day (BID) | ORAL | 0 refills | Status: DC

## 2017-12-08 MED ORDER — IPRATROPIUM-ALBUTEROL 0.5-2.5 (3) MG/3ML IN SOLN
3.0000 mL | Freq: Once | RESPIRATORY_TRACT | Status: AC
  Administered 2017-12-08: 3 mL via RESPIRATORY_TRACT
  Filled 2017-12-08: qty 3

## 2017-12-08 NOTE — ED Notes (Signed)
IV d/c'd to right lateral wrist, catheter intact, site wnl, bandaid applied to site

## 2017-12-08 NOTE — ED Notes (Signed)
Patient transported to X-ray 

## 2017-12-08 NOTE — ED Provider Notes (Signed)
[facility] EMERGENCY DEPARTMENT Provider Note   CSN: [id number] Arrival date & time: 12/08/17  1200     History   Chief Complaint Chief Complaint  Patient presents with  . Chest Pain    HPI [name] is a 84 y.o. male.  HPI   Patient presents for evaluation of chest pain.  Patient states the pain started about 3 hours ago, spontaneously.  He describes the pain as sharp, fleeting, and located in the left upper anterior chest.  He denies pain with touch, breathing, movement, walking.  There is no associated shortness of breath, diaphoresis, nausea, vomiting, weakness or dizziness.  He has never had this problem previously.  Patient has an unknown type of cancer, for which he takes an infusion once a month at a hospital in [place], [place].  He states he cannot remember what type of cancer this is.  He typically drives to [place] monthly for the treatments, then returns home.  There are no other no modifying factors.  Past Medical History:  Diagnosis Date  . Chronic kidney insufficiency     Patient Active Problem List   Diagnosis Date Noted  . Anemia 09/17/2017  . Abnormal CT of the abdomen 11/02/2016  . Loss of weight 11/02/2016  . History of colonic polyps 11/02/2016  . GERD (gastroesophageal reflux disease) 10/01/2015    Past Surgical History:  Procedure Laterality Date  . CATARACT EXTRACTION W/PHACO Right 03/09/2016   Procedure: CATARACT EXTRACTION PHACO AND INTRAOCULAR LENS PLACEMENT RIGHT EYE CDE=8.58;  Surgeon: Tonny Branch, MD;  Location: [facility];  Service: Ophthalmology;  Laterality: Right;  right  . CATARACT EXTRACTION W/PHACO Left 04/13/2016   Procedure: CATARACT EXTRACTION PHACO AND INTRAOCULAR LENS PLACEMENT (IOC);  Surgeon: Tonny Branch, MD;  Location: [facility];  Service: Ophthalmology;  Laterality: Left;  CDE: 8.08  . None to Date  10/01/15        Home Medications    Prior to Admission medications   Medication Sig Start Date End Date Taking?  Authorizing Provider  HYDROcodone-acetaminophen (NORCO/VICODIN) 5-325 MG tablet Take 0.5 tablets by mouth daily as needed for moderate pain.   Yes [provider]  amoxicillin-clavulanate (AUGMENTIN) 875-125 MG tablet Take 1 tablet by mouth 2 (two) times daily. One po bid x 7 days 12/08/17   Daleen Bo, MD  predniSONE (DELTASONE) 20 MG tablet Take 1 tablet (20 mg total) by mouth 2 (two) times daily. 12/08/17   Daleen Bo, MD    Family History Family History  Problem Relation Age of Onset  . Kidney disease Mother   . Kidney disease Sister   . Colon cancer Neg Hx   . Gastric cancer Neg Hx   . Esophageal cancer Neg Hx     Social History Social History   Tobacco Use  . Smoking status: Former Smoker    Years: 15.00    Types: Cigarettes, Cigars    Last attempt to quit: 10/01/1990    Years since quitting: 27.2  . Smokeless tobacco: Never Used  . Tobacco comment: Quit x 25-30 years; 2 cigars daily when smoked  Substance Use Topics  . Alcohol use: Yes    Alcohol/week: 0.0 oz    Comment: Glass of wine 1-2 times a week-"sometimes"  . Drug use: No     Allergies   Patient has no known allergies.   Review of Systems Review of Systems  All other systems reviewed and are negative.    Physical Exam Updated Vital Signs BP 119/76  Pulse 62   Temp 97.7 F (36.5 C) (Oral)   Resp 17   Ht 6\' 4"  (1.93 m)   Wt 65.3 kg (144 lb)   SpO2 98%   BMI 17.53 kg/m   Physical Exam  Constitutional: He is oriented to person, place, and time. He appears well-developed. No distress.  Elderly, frail  HENT:  Head: Normocephalic and atraumatic.  Right Ear: External ear normal.  Left Ear: External ear normal.  Eyes: Pupils are equal, round, and reactive to light. Conjunctivae and EOM are normal.  Neck: Normal range of motion and phonation normal. Neck supple.  Cardiovascular: Normal rate, regular rhythm and normal heart sounds.  Pulmonary/Chest: Effort normal. No stridor. No  respiratory distress. He exhibits no tenderness (No crepitation or deformity of the chest wall.) and no bony tenderness.  Somewhat decreased air movement bilaterally with scattered rhonchi.  Abdominal: Soft. There is no tenderness.  Musculoskeletal: Normal range of motion. He exhibits no edema, tenderness or deformity.  Neurological: He is alert and oriented to person, place, and time. No cranial nerve deficit or sensory deficit. He exhibits normal muscle tone. Coordination normal.  Skin: Skin is warm, dry and intact.  Psychiatric: He has a normal mood and affect. His behavior is normal. Judgment and thought content normal.  Nursing note and vitals reviewed.    ED Treatments / Results  Labs (all labs ordered are listed, but only abnormal results are displayed) Labs Reviewed  BASIC METABOLIC PANEL - Abnormal; Notable for the following components:      Result Value   CO2 21 (*)    Glucose, Bld 102 (*)    BUN 26 (*)    Creatinine, Ser 1.46 (*)    GFR calc non Af Amer 42 (*)    GFR calc Af Amer 48 (*)    All other components within normal limits  CBC - Abnormal; Notable for the following components:   RBC 4.00 (*)    Hemoglobin 11.0 (*)    HCT 34.1 (*)    RDW 20.0 (*)    Platelets 113 (*)    All other components within normal limits  TROPONIN I    EKG EKG Interpretation  Date/Time:  Wednesday December 08 2017 12:06:32 EDT Ventricular Rate:  64 PR Interval:  148 QRS Duration: 86 QT Interval:  406 QTC Calculation: 418 R Axis:   65 Text Interpretation:  Normal sinus rhythm Normal ECG since last tracing no significant change Confirmed by Daleen Bo [phone] on 12/08/2017 12:11:44 PM   Radiology Dg Chest 2 View  Result Date: 12/08/2017 CLINICAL DATA:  Onset of chest pain 2 hours prior to admission while helping with roofing. EXAM: CHEST - 2 VIEW COMPARISON:  Chest x-ray of November 23, 2016 FINDINGS: The lungs are mildly hyperinflated. The interstitial markings are coarse though  stable. There is stable biapical pleural thickening. The heart and pulmonary vascularity are normal. There is calcification in the wall of the aortic arch. Are chronic partial anterior compressions of midthoracic vertebral bodies. IMPRESSION: Chronic bronchitic changes, stable. No pneumonia, CHF, nor other acute cardiopulmonary abnormality. Electronically Signed   By: David  Martinique M.D.   On: 12/08/2017 13:12    Procedures Procedures (including critical care time)  Medications Ordered in ED Medications  ipratropium-albuterol (DUONEB) 0.5-2.5 (3) MG/3ML nebulizer solution 3 mL (3 mLs Nebulization Given 12/08/17 1357)     Initial Impression / Assessment and Plan / ED Course  I have reviewed the triage vital signs and the nursing notes.  Pertinent labs & imaging results that were available during my care of the patient were reviewed by me and considered in my medical decision making (see chart for details).  Clinical Course as of Dec 09 1451  Wed Dec 08, 2017  1319 Normal  Troponin I [EW]  1319 Normal except hemoglobin low  CBC(!) [EW]  1319 Normal except CO2 low, glucose high, BUN high, creatinine high  Basic metabolic panel(!) [EW]  6599 No pneumonia, or congestive heart failure, findings are consistent with chronic bronchitis.  Images reviewed by me.   [EW]    Clinical Course User Index [EW] Daleen Bo, MD     Patient Vitals for the past 24 hrs:  BP Temp Temp src Pulse Resp SpO2 Height Weight  12/08/17 1359 - - - - - 98 % - -  12/08/17 1330 - - - 62 17 97 % - -  12/08/17 1315 - - - 63 16 98 % - -  12/08/17 1300 - - - (!) 58 16 98 % - -  12/08/17 1210 119/76 97.7 F (36.5 C) Oral 63 18 100 % - -  12/08/17 1204 - - - - - - 6\' 4"  (1.93 m) 65.3 kg (144 lb)    2:53 PM Reevaluation with update and discussion. After initial assessment and treatment, an updated evaluation reveals patient states he did not have any change in his symptoms after use of the nebulizer.  He states he  has used them in the past without change as well.  He has no additional complaints and is currently comfortable and pain-free.  Findings discussed with the patient and all questions were answered. Daleen Bo   Medical Decision Making: Patient with likely chronic bronchitis and exacerbation, leading to nonspecific chest pain.  Doubt pneumonia, PE, ACS, serious bacterial infection or metabolic instability.  Will cover with prednisone and antibiotic, expectant management and PCP follow-up.  CRITICAL CARE-no Performed by: Daleen Bo   Nursing Notes Reviewed/ Care Coordinated Applicable Imaging Reviewed Interpretation of Laboratory Data incorporated into ED treatment  The patient appears reasonably screened and/or stabilized for discharge and I doubt any other medical condition or other [facility] requiring further screening, evaluation, or treatment in the ED at this time prior to discharge.  Plan: Home Medications- continue usual; Home Treatments-rest, fluids; return here if the recommended treatment, does not improve the symptoms; Recommended follow up-PCP, 1 week for checkup    Final Clinical Impressions(s) / ED Diagnoses   Final diagnoses:  Bronchitis    ED Discharge Orders        Ordered    amoxicillin-clavulanate (AUGMENTIN) 875-125 MG tablet  2 times daily     12/08/17 1452    predniSONE (DELTASONE) 20 MG tablet  2 times daily     12/08/17 1452       Daleen Bo, MD 12/08/17 1457

## 2017-12-08 NOTE — ED Triage Notes (Signed)
Chest pain onset 2 hours ago

## 2017-12-08 NOTE — ED Notes (Signed)
Respiratory called for neb tx 

## 2017-12-08 NOTE — ED Notes (Signed)
PT returned from xray with Faith.

## 2017-12-08 NOTE — Discharge Instructions (Addendum)
Your symptoms are likely related to bronchitis.  We are prescribing an antibiotic and prednisone to help treat this.  Make sure you are getting plenty of rest, eating 3 meals a day and drinking plenty of water.  Follow-up with your doctor next week for checkup.  Return here if needed, for problems.

## 2017-12-17 DIAGNOSIS — D469 Myelodysplastic syndrome, unspecified: Secondary | ICD-10-CM | POA: Diagnosis not present

## 2017-12-17 DIAGNOSIS — D46Z Other myelodysplastic syndromes: Secondary | ICD-10-CM | POA: Diagnosis not present

## 2018-02-09 DIAGNOSIS — D469 Myelodysplastic syndrome, unspecified: Secondary | ICD-10-CM | POA: Diagnosis not present

## 2018-02-10 DIAGNOSIS — D46Z Other myelodysplastic syndromes: Secondary | ICD-10-CM | POA: Diagnosis not present

## 2018-03-16 DIAGNOSIS — H43813 Vitreous degeneration, bilateral: Secondary | ICD-10-CM | POA: Diagnosis not present

## 2018-03-16 DIAGNOSIS — Z961 Presence of intraocular lens: Secondary | ICD-10-CM | POA: Diagnosis not present

## 2018-03-16 DIAGNOSIS — H26493 Other secondary cataract, bilateral: Secondary | ICD-10-CM | POA: Diagnosis not present

## 2018-03-23 DIAGNOSIS — D469 Myelodysplastic syndrome, unspecified: Secondary | ICD-10-CM | POA: Diagnosis not present

## 2018-03-24 DIAGNOSIS — D46Z Other myelodysplastic syndromes: Secondary | ICD-10-CM | POA: Diagnosis not present

## 2018-04-05 DIAGNOSIS — Z1389 Encounter for screening for other disorder: Secondary | ICD-10-CM | POA: Diagnosis not present

## 2018-04-05 DIAGNOSIS — Z681 Body mass index (BMI) 19 or less, adult: Secondary | ICD-10-CM | POA: Diagnosis not present

## 2018-04-05 DIAGNOSIS — Z0001 Encounter for general adult medical examination with abnormal findings: Secondary | ICD-10-CM | POA: Diagnosis not present

## 2018-04-26 DIAGNOSIS — Z961 Presence of intraocular lens: Secondary | ICD-10-CM | POA: Diagnosis not present

## 2018-04-26 DIAGNOSIS — H02831 Dermatochalasis of right upper eyelid: Secondary | ICD-10-CM | POA: Diagnosis not present

## 2018-04-26 DIAGNOSIS — H527 Unspecified disorder of refraction: Secondary | ICD-10-CM | POA: Diagnosis not present

## 2018-04-26 DIAGNOSIS — H26493 Other secondary cataract, bilateral: Secondary | ICD-10-CM | POA: Diagnosis not present

## 2018-04-26 DIAGNOSIS — H02834 Dermatochalasis of left upper eyelid: Secondary | ICD-10-CM | POA: Diagnosis not present

## 2018-05-03 DIAGNOSIS — H26493 Other secondary cataract, bilateral: Secondary | ICD-10-CM | POA: Diagnosis not present

## 2018-05-10 DIAGNOSIS — D469 Myelodysplastic syndrome, unspecified: Secondary | ICD-10-CM | POA: Diagnosis not present

## 2018-05-12 DIAGNOSIS — D46Z Other myelodysplastic syndromes: Secondary | ICD-10-CM | POA: Diagnosis not present

## 2018-05-19 ENCOUNTER — Emergency Department (HOSPITAL_COMMUNITY): Payer: Medicare Other

## 2018-05-19 ENCOUNTER — Encounter (HOSPITAL_COMMUNITY): Payer: Self-pay | Admitting: Emergency Medicine

## 2018-05-19 ENCOUNTER — Other Ambulatory Visit: Payer: Self-pay

## 2018-05-19 ENCOUNTER — Observation Stay (HOSPITAL_COMMUNITY)
Admission: EM | Admit: 2018-05-19 | Discharge: 2018-05-21 | Disposition: A | Discharge status: Home / Self Care | Payer: Medicare Other | Attending: Family Medicine | Admitting: Family Medicine

## 2018-05-19 DIAGNOSIS — R05 Cough: Secondary | ICD-10-CM | POA: Diagnosis present

## 2018-05-19 DIAGNOSIS — J449 Chronic obstructive pulmonary disease, unspecified: Secondary | ICD-10-CM | POA: Diagnosis not present

## 2018-05-19 DIAGNOSIS — Z681 Body mass index (BMI) 19 or less, adult: Secondary | ICD-10-CM | POA: Diagnosis not present

## 2018-05-19 DIAGNOSIS — J479 Bronchiectasis, uncomplicated: Secondary | ICD-10-CM | POA: Diagnosis not present

## 2018-05-19 DIAGNOSIS — D649 Anemia, unspecified: Secondary | ICD-10-CM | POA: Diagnosis not present

## 2018-05-19 DIAGNOSIS — R911 Solitary pulmonary nodule: Secondary | ICD-10-CM | POA: Diagnosis not present

## 2018-05-19 DIAGNOSIS — Z87891 Personal history of nicotine dependence: Secondary | ICD-10-CM | POA: Insufficient documentation

## 2018-05-19 DIAGNOSIS — R0602 Shortness of breath: Secondary | ICD-10-CM | POA: Diagnosis not present

## 2018-05-19 DIAGNOSIS — J441 Chronic obstructive pulmonary disease with (acute) exacerbation: Secondary | ICD-10-CM | POA: Diagnosis not present

## 2018-05-19 DIAGNOSIS — R0902 Hypoxemia: Secondary | ICD-10-CM | POA: Diagnosis not present

## 2018-05-19 DIAGNOSIS — J209 Acute bronchitis, unspecified: Secondary | ICD-10-CM | POA: Diagnosis not present

## 2018-05-19 DIAGNOSIS — K219 Gastro-esophageal reflux disease without esophagitis: Secondary | ICD-10-CM | POA: Diagnosis present

## 2018-05-19 LAB — BLOOD GAS, VENOUS
Acid-base deficit: 2.3 mmol/L — ABNORMAL HIGH (ref 0.0–2.0)
Bicarbonate: 21.8 mmol/L (ref 20.0–28.0)
FIO2: 30
O2 Saturation: 72.5 %
Patient temperature: 37.3
pCO2, Ven: 43.9 mmHg — ABNORMAL LOW (ref 44.0–60.0)
pH, Ven: 7.333 (ref 7.250–7.430)
pO2, Ven: 43.4 mmHg (ref 32.0–45.0)

## 2018-05-19 LAB — CBC
HCT: 34 % — ABNORMAL LOW (ref 39.0–52.0)
Hemoglobin: 10.5 g/dL — ABNORMAL LOW (ref 13.0–17.0)
MCH: 26.6 pg (ref 26.0–34.0)
MCHC: 30.9 g/dL (ref 30.0–36.0)
MCV: 86.3 fL (ref 80.0–100.0)
Platelets: 157 10*3/uL (ref 150–400)
RBC: 3.94 MIL/uL — ABNORMAL LOW (ref 4.22–5.81)
RDW: 20.2 % — ABNORMAL HIGH (ref 11.5–15.5)
WBC: 9.3 10*3/uL (ref 4.0–10.5)
nRBC: 0 % (ref 0.0–0.2)

## 2018-05-19 LAB — BASIC METABOLIC PANEL
Anion gap: 10 (ref 5–15)
BUN: 22 mg/dL (ref 8–23)
CO2: 21 mmol/L — ABNORMAL LOW (ref 22–32)
Calcium: 9.3 mg/dL (ref 8.9–10.3)
Chloride: 103 mmol/L (ref 98–111)
Creatinine, Ser: 1.33 mg/dL — ABNORMAL HIGH (ref 0.61–1.24)
GFR calc Af Amer: 56 mL/min — ABNORMAL LOW (ref >60)
GFR calc non Af Amer: 48 mL/min — ABNORMAL LOW (ref >60)
Glucose, Bld: 105 mg/dL — ABNORMAL HIGH (ref 70–99)
Potassium: 3.9 mmol/L (ref 3.5–5.1)
Sodium: 134 mmol/L — ABNORMAL LOW (ref 135–145)

## 2018-05-19 LAB — I-STAT CG4 LACTIC ACID, ED: Lactic Acid, Venous: 1.91 mmol/L — ABNORMAL HIGH (ref 0.5–1.9)

## 2018-05-19 LAB — I-STAT TROPONIN, ED: Troponin i, poc: 0.03 ng/mL (ref 0.00–0.08)

## 2018-05-19 LAB — LACTIC ACID, PLASMA: Lactic Acid, Venous: 1 mmol/L (ref 0.5–1.9)

## 2018-05-19 MED ORDER — IPRATROPIUM-ALBUTEROL 0.5-2.5 (3) MG/3ML IN SOLN
3.0000 mL | Freq: Four times a day (QID) | RESPIRATORY_TRACT | Status: DC
  Administered 2018-05-20 (×4): 3 mL via RESPIRATORY_TRACT
  Filled 2018-05-19 (×4): qty 3

## 2018-05-19 MED ORDER — SODIUM CHLORIDE 0.9 % IV SOLN
INTRAVENOUS | Status: DC
  Administered 2018-05-19 – 2018-05-21 (×3): via INTRAVENOUS

## 2018-05-19 MED ORDER — ALBUTEROL SULFATE (2.5 MG/3ML) 0.083% IN NEBU: 2.5000 mg | INHALATION_SOLUTION | RESPIRATORY_TRACT | Status: DC | PRN

## 2018-05-19 MED ORDER — METHYLPREDNISOLONE SODIUM SUCC 40 MG IJ SOLR
40.0000 mg | Freq: Four times a day (QID) | INTRAMUSCULAR | Status: AC
  Administered 2018-05-19 – 2018-05-20 (×4): 40 mg via INTRAVENOUS
  Filled 2018-05-19 (×4): qty 1

## 2018-05-19 MED ORDER — IPRATROPIUM-ALBUTEROL 0.5-2.5 (3) MG/3ML IN SOLN
3.0000 mL | Freq: Once | RESPIRATORY_TRACT | Status: AC
  Administered 2018-05-19: 3 mL via RESPIRATORY_TRACT
  Filled 2018-05-19: qty 3

## 2018-05-19 MED ORDER — SODIUM CHLORIDE 0.9 % IV BOLUS
1000.0000 mL | Freq: Once | INTRAVENOUS | Status: AC
  Administered 2018-05-19: 1000 mL via INTRAVENOUS

## 2018-05-19 MED ORDER — METHYLPREDNISOLONE SODIUM SUCC 125 MG IJ SOLR
125.0000 mg | Freq: Once | INTRAMUSCULAR | Status: AC
  Administered 2018-05-19: 125 mg via INTRAVENOUS
  Filled 2018-05-19: qty 2

## 2018-05-19 MED ORDER — ENOXAPARIN SODIUM 40 MG/0.4ML ~~LOC~~ SOLN: 40.0000 mg | SUBCUTANEOUS | Status: DC

## 2018-05-19 MED ORDER — ALBUTEROL SULFATE (2.5 MG/3ML) 0.083% IN NEBU: 2.5000 mg | INHALATION_SOLUTION | Freq: Four times a day (QID) | RESPIRATORY_TRACT | Status: DC

## 2018-05-19 MED ORDER — SODIUM CHLORIDE 0.9 % IV SOLN
500.0000 mg | INTRAVENOUS | Status: DC
  Administered 2018-05-19: 500 mg via INTRAVENOUS
  Filled 2018-05-19: qty 500

## 2018-05-19 MED ORDER — SODIUM CHLORIDE 0.9 % IV SOLN
2.0000 g | Freq: Once | INTRAVENOUS | Status: AC
  Administered 2018-05-19: 2 g via INTRAVENOUS
  Filled 2018-05-19: qty 20

## 2018-05-19 MED ORDER — ENOXAPARIN SODIUM 40 MG/0.4ML ~~LOC~~ SOLN
40.0000 mg | Freq: Every day | SUBCUTANEOUS | Status: DC
  Administered 2018-05-20 – 2018-05-21 (×2): 40 mg via SUBCUTANEOUS
  Filled 2018-05-19 (×2): qty 0.4

## 2018-05-19 MED ORDER — IOPAMIDOL (ISOVUE-370) INJECTION 76%
100.0000 mL | Freq: Once | INTRAVENOUS | Status: AC | PRN
  Administered 2018-05-19: 100 mL via INTRAVENOUS

## 2018-05-19 MED ORDER — IPRATROPIUM BROMIDE 0.02 % IN SOLN: 0.5000 mg | Freq: Four times a day (QID) | RESPIRATORY_TRACT | Status: DC

## 2018-05-19 MED ORDER — PREDNISONE 20 MG PO TABS
40.0000 mg | ORAL_TABLET | Freq: Every day | ORAL | Status: DC
  Administered 2018-05-21: 40 mg via ORAL
  Filled 2018-05-19: qty 2

## 2018-05-19 NOTE — ED Triage Notes (Signed)
Patient states he was at [facility] and the doctor told him he needed to come to ER immediately for tachypnea and low O2 saturation. Complaining of cough for over a week. States he is coughing up yellow colored sputum.

## 2018-05-19 NOTE — Progress Notes (Signed)
Patient taken off of BIPAP to go to CT and was placed on [facility]. Patient returned without any complications. RT put patient on room air at this time. Sats are 98% RR 17. BBS Clear and diminished. Will continue to monitor.

## 2018-05-19 NOTE — ED Provider Notes (Signed)
[facility] Emergency Department Provider Note MRN:  [id number]  Arrival date & time: 05/19/18     Chief Complaint   Cough   History of Present Illness   [name] is a 85 y.o. year-old male with a history of CKD presenting to the ED with chief complaint of shortness of breath.  1 week of persistent cough, more recently with yellow sputum.  Sent here by PCP for tachypnea and hypoxia.  Patient endorsing chest tightness yesterday, not currently.  I was unable to obtain an accurate HPI, PMH, or ROS due to the patient's respiratory distress.  Review of Systems  Positive for cough, shortness of breath.  Patient's Health History    Past Medical History:  Diagnosis Date  . Chronic kidney insufficiency     Past Surgical History:  Procedure Laterality Date  . CATARACT EXTRACTION W/PHACO Right 03/09/2016   Procedure: CATARACT EXTRACTION PHACO AND INTRAOCULAR LENS PLACEMENT RIGHT EYE CDE=8.58;  Surgeon: Tonny Branch, MD;  Location: [facility];  Service: Ophthalmology;  Laterality: Right;  right  . CATARACT EXTRACTION W/PHACO Left 04/13/2016   Procedure: CATARACT EXTRACTION PHACO AND INTRAOCULAR LENS PLACEMENT (IOC);  Surgeon: Tonny Branch, MD;  Location: [facility];  Service: Ophthalmology;  Laterality: Left;  CDE: 8.08  . None to Date  10/01/15    Family History  Problem Relation Age of Onset  . Kidney disease Mother   . Kidney disease Sister   . Colon cancer Neg Hx   . Gastric cancer Neg Hx   . Esophageal cancer Neg Hx     Social History   Socioeconomic History  . Marital status: Widowed    Spouse name: Not on file  . Number of children: Not on file  . Years of education: Not on file  . Highest education level: Not on file  Occupational History  . Not on file  Social Needs  . Financial resource strain: Not on file  . Food insecurity:    Worry: Not on file    Inability: Not on file  . Transportation needs:    Medical: Not on file    Non-medical: Not on  file  Tobacco Use  . Smoking status: Former Smoker    Years: 15.00    Types: Cigarettes, Cigars    Last attempt to quit: 10/01/1990    Years since quitting: 27.6  . Smokeless tobacco: Never Used  . Tobacco comment: Quit x 25-30 years; 2 cigars daily when smoked  Substance and Sexual Activity  . Alcohol use: Yes    Alcohol/week: 0.0 standard drinks    Comment: Glass of wine 1-2 times a week-"sometimes"  . Drug use: No  . Sexual activity: Never    Birth control/protection: None  Lifestyle  . Physical activity:    Days per week: Not on file    Minutes per session: Not on file  . Stress: Not on file  Relationships  . Social connections:    Talks on phone: Not on file    Gets together: Not on file    Attends religious service: Not on file    Active member of club or organization: Not on file    Attends meetings of clubs or organizations: Not on file    Relationship status: Not on file  . Intimate partner violence:    Fear of current or ex partner: Not on file    Emotionally abused: Not on file    Physically abused: Not on file    Forced  sexual activity: Not on file  Other Topics Concern  . Not on file  Social History Narrative  . Not on file     Physical Exam  Vital Signs and Nursing Notes reviewed Vitals:   05/19/18 2300 05/19/18 2330  BP: 97/61 (!) 90/59  Pulse: 80 75  Resp: 20 (!) 22  Temp:    SpO2: 97% 96%    CONSTITUTIONAL: Ill-appearing, moderate respiratory distress, fatigued NEURO:  Alert and oriented x 3, no focal deficits EYES:  eyes equal and reactive ENT/NECK:  no LAD, no JVD CARDIO:  regular rate, well-perfused, normal S1 and S2 PULM: Increased work of breathing, tachypneic, crackles in bilateral bases, only able to speak 1 word at a time GI/GU:  normal bowel sounds, non-distended, non-tender MSK/SPINE:  No gross deformities, no edema SKIN:  no rash, atraumatic PSYCH:  Appropriate speech and behavior  Diagnostic and Interventional Summary    EKG  Interpretation  Date/Time:    Ventricular Rate:    PR Interval:    QRS Duration:   QT Interval:    QTC Calculation:   R Axis:     Text Interpretation:        Labs Reviewed  CBC - Abnormal; Notable for the following components:      Result Value   RBC 3.94 (*)    Hemoglobin 10.5 (*)    HCT 34.0 (*)    RDW 20.2 (*)    All other components within normal limits  BASIC METABOLIC PANEL - Abnormal; Notable for the following components:   Sodium 134 (*)    CO2 21 (*)    Glucose, Bld 105 (*)    Creatinine, Ser 1.33 (*)    GFR calc non Af Amer 48 (*)    GFR calc Af Amer 56 (*)    All other components within normal limits  BLOOD GAS, VENOUS - Abnormal; Notable for the following components:   pCO2, Ven 43.9 (*)    Acid-base deficit 2.3 (*)    All other components within normal limits  I-STAT CG4 LACTIC ACID, ED - Abnormal; Notable for the following components:   Lactic Acid, Venous 1.91 (*)    All other components within normal limits  CULTURE, BLOOD (ROUTINE X 2)  CULTURE, BLOOD (ROUTINE X 2)  LACTIC ACID, PLASMA  URINALYSIS, ROUTINE W REFLEX MICROSCOPIC  CBC WITH DIFFERENTIAL/PLATELET  COMPREHENSIVE METABOLIC PANEL  I-STAT TROPONIN, ED    CT ANGIO CHEST PE W OR WO CONTRAST  Final Result    DG Chest Port 1 View  Final Result      Medications  azithromycin (ZITHROMAX) 500 mg in sodium chloride 0.9 % 250 mL IVPB (0 mg Intravenous Stopped 05/19/18 1917)  0.9 %  sodium chloride infusion ( Intravenous New Bag/Given 05/19/18 2342)  albuterol (PROVENTIL) (2.5 MG/3ML) 0.083% nebulizer solution 2.5 mg (has no administration in time range)  methylPREDNISolone sodium succinate (SOLU-MEDROL) 40 mg/mL injection 40 mg (40 mg Intravenous Given 05/19/18 2342)    Followed by  predniSONE (DELTASONE) tablet 40 mg (has no administration in time range)  enoxaparin (LOVENOX) injection 40 mg (has no administration in time range)  ipratropium-albuterol (DUONEB) 0.5-2.5 (3) MG/3ML nebulizer solution  3 mL (has no administration in time range)  ipratropium-albuterol (DUONEB) 0.5-2.5 (3) MG/3ML nebulizer solution 3 mL (3 mLs Nebulization Given 05/19/18 1759)  cefTRIAXone (ROCEPHIN) 2 g in sodium chloride 0.9 % 100 mL IVPB (0 g Intravenous Stopped 05/19/18 1812)  methylPREDNISolone sodium succinate (SOLU-MEDROL) 125 mg/2 mL injection 125 mg (  125 mg Intravenous Given 05/19/18 1817)  sodium chloride 0.9 % bolus 1,000 mL (0 mLs Intravenous Stopped 05/19/18 1917)  iopamidol (ISOVUE-370) 76 % injection 100 mL (100 mLs Intravenous Contrast Given 05/19/18 2049)     Procedures Critical Care Critical Care Documentation Critical care time provided by me (excluding procedures): 38 minutes  Condition necessitating critical care: Severe COPD exacerbation  Components of critical care management: reviewing of prior records, laboratory and imaging interpretation, frequent re-examination and reassessment of vital signs, administration of noninvasive positive pressure ventilation, IV antibiotics, discussion with consulting services.    ED Course and Medical Decision Making  I have reviewed the triage vital signs and the nursing notes.  Pertinent labs & imaging results that were available during my care of the patient were reviewed by me and considered in my medical decision making (see below for details).  Concern for impending respiratory failure in this 85 year old male with clinical concerns of pneumonia.  Seems to be tiring out, breathing rapidly, will provide BiPAP and monitor closely.  Code sepsis initiated given likely pneumonia.  Would consider CTPA if chest x-ray is nondiagnostic.  Chest x-ray with no obvious pneumonia, raising concern for possible PE.  CTPA unremarkable.  No formal diagnosis, but favoring COPD exacerbation.  Patient feeling much better after BiPAP, able to transition off, tolerating p.o.  Admitted to hospital service for further care.  Barth Kirks. Sedonia Small, [place] [email or web address]  Final Clinical Impressions(s) / ED Diagnoses     ICD-10-CM   1. COPD exacerbation ([place]) J44.1   2. SOB (shortness of breath) R06.02 DG Chest [facility]    DG Chest [facility]    ED Discharge Orders    None         Maudie Flakes, MD 05/19/18 [phone]

## 2018-05-19 NOTE — H&P (Signed)
History and Physical    [name] MPN:[id number] DOB: 10/26/1929 DOA: 05/19/2018  PCP: Sharilyn Sites, MD   Patient coming from: Home.  I have personally briefly reviewed patient's old medical records in [place]  Chief Complaint: Shortness of breath and cough.  HPI: [name] is a 85 y.o. male with medical history significant of chronic kidney insufficiency, cataracts who is coming to the emergency department with complaints of progressively worse yellowish producing sputum cough and dyspnea for the past week.  Per patient, on December 26 while he was visiting his daughter in [place], he developed a sore throat.  Discontinue for the following dates.  He saw a physician there, but he was diagnosed with a viral illness and did not need any medications at that time.  However, his symptoms progressed and he developed a cough with yellowish producing sputum associated with progressively worse dyspnea, fatigue and decreased appetite.  He saw a physician today who referred him to the emergency department after he noticed he was very tachypneic.  He denies fever, chills, rhinorrhea, wheezing, hemoptysis, chest pain, palpitations, dizziness, diaphoresis, PND, orthopnea or pitting edema of the lower extremities.  He denies abdominal pain, nausea, emesis, diarrhea, constipation, melena or hematochezia.  No dysuria, frequency or hematuria.  No polyuria, polydipsia, polyphagia or blurred vision.  No heat or cold intolerance.  Denies skin rashes or pruritus.  ED Course: Initial vital signs were temperature 97.8 F, pulse 94, respirations 40, blood pressure 111/71 mmHg and O2 sat 95% on room air.  Patient was fluid on BiPAP ventilation, given bronchodilators, ceftriaxone, azithromycin IV and Solu-Medrol 125 mg IVP.  Urinalysis showed increase of specific gravity, small hemoglobinuria, ketones of 20 mg/dL and trace leukocyte esterase.  Initial lactic acid was 1.91 and repeat was 1.0 mmol/L.   Troponin 0 0.03.  White count is 9.3, hemoglobin 10.5 g/dL and platelets 257.  Venous gas showed a PCO2 of 43.9 mmHg and acid base is 2.3 mmol/L, otherwise was normal.  Sodium 134, potassium 3.9, chloride 103 and CO2 21 mmol/L.  BUN was 22, creatinine 1.33, calcium 9.3 and glucose 105 mg/dL.  Imaging: Chest radiograph with unchanged COPD and did not show any acute cardiopulmonary disease.  CTA chest did not show any pulmonary embolism.  There was peribronchial thickening, mucous plugging and clustering nodules in the lung bases compatible with acute bronchitis/from acute colitis.  Questionable aspiration.  There was mild bilateral lower lobe bronchiectasis.  There was a new 11 mm nodule in the left upper lobe, possibly infectious/inflammatory.  Consider repeating CT in 3 months.  Review of Systems: As per HPI otherwise 10 point review of systems negative.  Past Medical History:  Diagnosis Date  . Chronic kidney insufficiency     Past Surgical History:  Procedure Laterality Date  . CATARACT EXTRACTION W/PHACO Right 03/09/2016   Procedure: CATARACT EXTRACTION PHACO AND INTRAOCULAR LENS PLACEMENT RIGHT EYE CDE=8.58;  Surgeon: Tonny Branch, MD;  Location: [facility];  Service: Ophthalmology;  Laterality: Right;  right  . CATARACT EXTRACTION W/PHACO Left 04/13/2016   Procedure: CATARACT EXTRACTION PHACO AND INTRAOCULAR LENS PLACEMENT (IOC);  Surgeon: Tonny Branch, MD;  Location: [facility];  Service: Ophthalmology;  Laterality: Left;  CDE: 8.08  . None to Date  10/01/15     reports that he quit smoking about 27 years ago. His smoking use included cigarettes and cigars. He quit after 15.00 years of use. He has never used smokeless tobacco. He reports current alcohol use. He reports  that he does not use drugs.  No Known Allergies  Family History  Problem Relation Age of Onset  . Kidney disease Mother   . Kidney disease Sister   . Colon cancer Neg Hx   . Gastric cancer Neg Hx   . Esophageal cancer Neg Hx      Prior to Admission medications   Medication Sig Start Date End Date Taking? Authorizing Provider  predniSONE (DELTASONE) 5 MG tablet Take 5 mg by mouth daily with breakfast.   Yes [provider]  raNITIdine HCl (ZANTAC PO) Take 1 tablet by mouth daily as needed (for heartburn).   Yes [provider]    Physical Exam: Vitals:   05/19/18 2005 05/19/18 2030 05/19/18 2200 05/19/18 2230  BP: 123/63 (!) 102/58 (!) 100/56 (!) 98/56  Pulse: 93 86 86 86  Resp: (!) 26 (!) 22 (!) 26 20  Temp:      TempSrc:      SpO2: 100% 100% 95% 96%  Weight:      Height:        Constitutional: NAD, calm, comfortable Eyes: PERRL, lids and conjunctivae normal ENMT: Mucous membranes are moist. Posterior pharynx clear of any exudate or lesions. Neck: normal, supple, no masses, no thyromegaly Respiratory: Decreased breath sounds with bilateral wheezing and rhonchi.  Mildly tachypneic 24 bpm, but no accessory muscle use.  Cardiovascular: Regular rate and rhythm, no murmurs / rubs / gallops. No extremity edema. 2+ pedal pulses. No carotid bruits.  Abdomen: Soft, no tenderness, no masses palpated. No hepatosplenomegaly. Bowel sounds positive.  Musculoskeletal: no clubbing / cyanosis.  Good ROM, no contractures. Normal muscle tone.  Skin: no rashes, lesions, ulcers. No induration on limited dermatological examination. Neurologic: CN 2-12 grossly intact. Sensation intact, DTR normal. Strength 5/5 in all 4.  Psychiatric: Normal judgment and insight. Alert and oriented x 4. Normal mood.   Labs on Admission: I have personally reviewed following labs and imaging studies  CBC: Recent Labs  Lab 05/19/18 1713  WBC 9.3  HGB 10.5*  HCT 34.0*  MCV 86.3  PLT 277   Basic Metabolic Panel: Recent Labs  Lab 05/19/18 1713  NA 134*  K 3.9  CL 103  CO2 21*  GLUCOSE 105*  BUN 22  CREATININE 1.33*  CALCIUM 9.3   GFR: Estimated Creatinine Clearance: 35.6 mL/min (A) (by C-G formula based on SCr  of 1.33 mg/dL (H)). Liver Function Tests: No results for input(s): AST, ALT, ALKPHOS, BILITOT, PROT, ALBUMIN in the last 168 hours. No results for input(s): LIPASE, AMYLASE in the last 168 hours. No results for input(s): AMMONIA in the last 168 hours. Coagulation Profile: No results for input(s): INR, PROTIME in the last 168 hours. Cardiac Enzymes: No results for input(s): CKTOTAL, CKMB, CKMBINDEX, TROPONINI in the last 168 hours. BNP (last 3 results) No results for input(s): PROBNP in the last 8760 hours. HbA1C: No results for input(s): HGBA1C in the last 72 hours. CBG: No results for input(s): GLUCAP in the last 168 hours. Lipid Profile: No results for input(s): CHOL, HDL, LDLCALC, TRIG, CHOLHDL, LDLDIRECT in the last 72 hours. Thyroid Function Tests: No results for input(s): TSH, T4TOTAL, FREET4, T3FREE, THYROIDAB in the last 72 hours. Anemia Panel: No results for input(s): VITAMINB12, FOLATE, FERRITIN, TIBC, IRON, RETICCTPCT in the last 72 hours. Urine analysis:    Component Value Date/Time   COLORURINE AMBER (A) 07/30/2017 1233   APPEARANCEUR CLOUDY (A) 07/30/2017 1233   LABSPEC 1.017 07/30/2017 1233   PHURINE 5.0 07/30/2017  [place] 07/30/2017 1233   HGBUR MODERATE (A) 07/30/2017 1233   BILIRUBINUR NEGATIVE 07/30/2017 1233   KETONESUR 20 (A) 07/30/2017 1233   PROTEINUR 30 (A) 07/30/2017 1233   UROBILINOGEN 0.2 08/15/2013 1240   NITRITE POSITIVE (A) 07/30/2017 1233   LEUKOCYTESUR LARGE (A) 07/30/2017 1233    Radiological Exams on Admission: Ct Angio Chest Pe W Or Wo Contrast  Result Date: 05/19/2018 CLINICAL DATA:  85 y/o M; tachypnea and hypoxia. One week of cough. PE suspected. EXAM: CT ANGIOGRAPHY CHEST WITH CONTRAST TECHNIQUE: Multidetector CT imaging of the chest was performed using the standard protocol during bolus administration of intravenous contrast. Multiplanar CT image reconstructions and MIPs were obtained to evaluate the vascular anatomy.  CONTRAST:  173mL ISOVUE-370 IOPAMIDOL (ISOVUE-370) INJECTION 76% COMPARISON:  09/23/2016 CT angiogram chest FINDINGS: Cardiovascular: Satisfactory opacification of the pulmonary arteries to the segmental level. No evidence of pulmonary embolism. Normal heart size. No pericardial effusion. Severe coronary artery and mild aortic calcific atherosclerosis. Mediastinum/Nodes: No enlarged mediastinal, hilar, or axillary lymph nodes. Thyroid gland, trachea, and esophagus demonstrate no significant findings. Lungs/Pleura: Diffuse peribronchial thickening with mucous plugging greatest in the lung bases. Mild bilateral lower lobe bronchiectasis. Clustered nodules in bronchovascular distribution in the lung bases. Separate discrete nodule in left upper lobe measuring 8 x 11 mm (series 15, image 76). No pleural effusion or pneumothorax. Mild centrilobular emphysema with upper lobe predominance. Mild biapical pleuroparenchymal scarring. Upper Abdomen: Stable hypodensities within the left kidney, partially visualize, compatible with cysts. Musculoskeletal: T6-9 chronic compression deformities with kyphosis. Nondisplaced bilateral chronic rib fractures. No acute osseous abnormality is evident. Review of the MIP images confirms the above findings. IMPRESSION: 1. No pulmonary embolus identified. 2. Peribronchial thickening, mucous plugging, and clustered nodules in the lung bases compatible with acute bronchitis/bronchiolitis. Aspiration is possible given distribution. 3. Mild bilateral lower lobe bronchiectasis. 4. New 11 mm nodule in left upper lobe, possibly infectious/inflammatory. Consider one of the following in 3 months for both low-risk and high-risk individuals: (a) repeat chest CT, (b) follow-up PET-CT, or (c) tissue sampling. This recommendation follows the consensus statement: Guidelines for Management of Incidental Pulmonary Nodules Detected on CT Images: From the Fleischner Society 2017; Radiology 2017; 284:228-243. 5.  Aortic Atherosclerosis (ICD10-I70.0) and Emphysema (ICD10-J43.9). 6. Severe coronary artery calcific atherosclerosis. Electronically Signed   By: Kristine Garbe M.D.   On: 05/19/2018 21:27   Dg Chest Port 1 View  Result Date: 05/19/2018 CLINICAL DATA:  Chest congestion, cough and shortness of breath. EXAM: PORTABLE CHEST 1 VIEW COMPARISON:  12/08/2017 FINDINGS: Cardiac silhouette is normal in size. No mediastinal or hilar masses. Lungs are hyperexpanded. There are prominent bronchovascular markings with mild peripheral lower lung zone interstitial thickening. No evidence of pneumonia or pulmonary edema. No pleural effusion or pneumothorax. Skeletal structures are grossly intact. IMPRESSION: 1. No acute cardiopulmonary disease. 2. COPD.  No change from the prior chest radiographs. Electronically Signed   By: Lajean Manes M.D.   On: 05/19/2018 17:05    EKG: Independently reviewed.  EKG tracing is not available physically or electronically.  Assessment/Plan Principal Problem:   COPD exacerbation ([place])   Bronchiectasis ([place]) Observation/telemetry. Continue supplemental oxygen. Continue bronchodilators. Continue Solu-Medrol 40 mg IVP every 6 hours. Discontinue azithromycin IV. Levaquin 500 mg p.o. daily starting later today.  Active Problems:   GERD (gastroesophageal reflux disease) Protonix 40 mg p.o. daily.    Anemia Monitor hematocrit and hemoglobin. Check anemia panel    Pulmonary nodule Advised to follow-up  with PCP to set up future imaging.     DVT prophylaxis: Lovenox SQ. Code Status: Full code. Family Communication: Disposition Plan: Observation for COPD/bronchiectasis exacerbation treatment. Consults called: Admission status: Observation/telemetry.   Reubin Milan MD [facility]   If 7PM-7AM, please contact night-coverage [email or web address] Password [facility]  05/19/2018, 10:56 PM    This document was prepared using Dragon voice recognition software  and may contain some unintended transcription errors.

## 2018-05-20 DIAGNOSIS — J441 Chronic obstructive pulmonary disease with (acute) exacerbation: Secondary | ICD-10-CM | POA: Diagnosis present

## 2018-05-20 DIAGNOSIS — K219 Gastro-esophageal reflux disease without esophagitis: Secondary | ICD-10-CM

## 2018-05-20 DIAGNOSIS — R911 Solitary pulmonary nodule: Secondary | ICD-10-CM | POA: Diagnosis not present

## 2018-05-20 DIAGNOSIS — J471 Bronchiectasis with (acute) exacerbation: Secondary | ICD-10-CM

## 2018-05-20 LAB — COMPREHENSIVE METABOLIC PANEL
ALT: 18 U/L (ref 0–44)
AST: 14 U/L — ABNORMAL LOW (ref 15–41)
Albumin: 3.1 g/dL — ABNORMAL LOW (ref 3.5–5.0)
Alkaline Phosphatase: 61 U/L (ref 38–126)
Anion gap: 8 (ref 5–15)
BUN: 22 mg/dL (ref 8–23)
CO2: 19 mmol/L — ABNORMAL LOW (ref 22–32)
Calcium: 8.5 mg/dL — ABNORMAL LOW (ref 8.9–10.3)
Chloride: 110 mmol/L (ref 98–111)
Creatinine, Ser: 1.18 mg/dL (ref 0.61–1.24)
GFR calc Af Amer: >60 mL/min (ref >60)
GFR calc non Af Amer: 56 mL/min — ABNORMAL LOW (ref >60)
Glucose, Bld: 158 mg/dL — ABNORMAL HIGH (ref 70–99)
Potassium: 3.9 mmol/L (ref 3.5–5.1)
Sodium: 137 mmol/L (ref 135–145)
Total Bilirubin: 0.7 mg/dL (ref 0.3–1.2)
Total Protein: 6.5 g/dL (ref 6.5–8.1)

## 2018-05-20 LAB — CBC WITH DIFFERENTIAL/PLATELET
Basophils Absolute: 0.1 10*3/uL (ref 0.0–0.1)
Basophils Relative: 1 %
Eosinophils Absolute: 0 10*3/uL (ref 0.0–0.5)
Eosinophils Relative: 0 %
HCT: 28.5 % — ABNORMAL LOW (ref 39.0–52.0)
Hemoglobin: 9.1 g/dL — ABNORMAL LOW (ref 13.0–17.0)
Lymphocytes Relative: 5 %
Lymphs Abs: 0.2 10*3/uL — ABNORMAL LOW (ref 0.7–4.0)
MCH: 27.4 pg (ref 26.0–34.0)
MCHC: 31.9 g/dL (ref 30.0–36.0)
MCV: 85.8 fL (ref 80.0–100.0)
Monocytes Absolute: 0.1 10*3/uL (ref 0.1–1.0)
Monocytes Relative: 1 %
Neutro Abs: 4.1 10*3/uL (ref 1.7–7.7)
Neutrophils Relative %: 88 %
Platelets: 184 10*3/uL (ref 150–400)
RBC: 3.32 MIL/uL — ABNORMAL LOW (ref 4.22–5.81)
RDW: 19.9 % — ABNORMAL HIGH (ref 11.5–15.5)
WBC: 4.6 10*3/uL (ref 4.0–10.5)
nRBC: 0 % (ref 0.0–0.2)

## 2018-05-20 LAB — RETICULOCYTES
Immature Retic Fract: 14.2 % (ref 2.3–15.9)
RBC.: 3.32 MIL/uL — ABNORMAL LOW (ref 4.22–5.81)
Retic Count, Absolute: 41.2 10*3/uL (ref 19.0–186.0)
Retic Ct Pct: 1.2 % (ref 0.4–3.1)

## 2018-05-20 LAB — URINALYSIS, ROUTINE W REFLEX MICROSCOPIC
Bacteria, UA: NONE SEEN
Bilirubin Urine: NEGATIVE
Glucose, UA: NEGATIVE mg/dL
Ketones, ur: 20 mg/dL — AB
Nitrite: NEGATIVE
Protein, ur: NEGATIVE mg/dL
Specific Gravity, Urine: >1.046 — ABNORMAL HIGH (ref 1.005–1.030)
pH: 5 (ref 5.0–8.0)

## 2018-05-20 LAB — IRON AND TIBC
Iron: 21 ug/dL — ABNORMAL LOW (ref 45–182)
Saturation Ratios: 12 % — ABNORMAL LOW (ref 17.9–39.5)
TIBC: 171 ug/dL — ABNORMAL LOW (ref 250–450)
UIBC: 150 ug/dL

## 2018-05-20 LAB — FOLATE: Folate: 14.1 ng/mL (ref >5.9)

## 2018-05-20 LAB — FERRITIN: Ferritin: 373 ng/mL — ABNORMAL HIGH (ref 24–336)

## 2018-05-20 LAB — VITAMIN B12: Vitamin B-12: 1200 pg/mL — ABNORMAL HIGH (ref 180–914)

## 2018-05-20 MED ORDER — PANTOPRAZOLE SODIUM 40 MG PO TBEC
40.0000 mg | DELAYED_RELEASE_TABLET | Freq: Every day | ORAL | Status: DC
  Administered 2018-05-20 – 2018-05-21 (×2): 40 mg via ORAL
  Filled 2018-05-20 (×2): qty 1

## 2018-05-20 MED ORDER — LEVOFLOXACIN 500 MG PO TABS
500.0000 mg | ORAL_TABLET | Freq: Every day | ORAL | Status: DC
  Administered 2018-05-20: 500 mg via ORAL
  Filled 2018-05-20 (×2): qty 1

## 2018-05-20 MED ORDER — IPRATROPIUM-ALBUTEROL 0.5-2.5 (3) MG/3ML IN SOLN
3.0000 mL | Freq: Three times a day (TID) | RESPIRATORY_TRACT | Status: DC
  Filled 2018-05-20 (×2): qty 3

## 2018-05-20 NOTE — Clinical Social Work Note (Signed)
Spoke with daughter Madalyn Rob, [phone], who lives out of state, and explained findings of PT.  She is concerned that upon d/c, patient will not eat well and will not stay hydrated.  Explained that it is up to family to stay with him or arrange for someone else to be with him if he needs care in the home. She voiced understanding.  CSW sign off.

## 2018-05-20 NOTE — Evaluation (Addendum)
Physical Therapy Evaluation Patient Details Name: [name] MRN: [id number] DOB: 1930/03/16 Today's Date: 05/20/2018   History of Present Illness  [name] is a 85 y/o male with hx of chronic kidney insufficiency, cataracts, who comes to ED with c/o progressively worse yellowish producing sputum cough and dyspnea for the past week    Clinical Impression  Patient functioning at baseline for functional mobility and gait.  Patient ambulated while on room air and O2 saturation maintained above 96% - RN notified.  Plan:  Patient discharged from physical therapy to care of nursing for ambulation daily as tolerated for length of stay.    Follow Up Recommendations No PT follow up    Equipment Recommendations  None recommended by PT    Recommendations for Other Services       Precautions / Restrictions Precautions Precautions: None Restrictions Weight Bearing Restrictions: No      Mobility  Bed Mobility Overal bed mobility: Independent                Transfers Overall transfer level: Independent Equipment used: None                Ambulation/Gait Ambulation/Gait assistance: Independent Gait Distance (Feet): 200 Feet(200) Assistive device: None Gait Pattern/deviations: WFL(Within Functional Limits) Gait velocity: normal   General Gait Details: no loss of balance  Stairs            Wheelchair Mobility    Modified Rankin (Stroke Patients Only)       Balance Overall balance assessment: No apparent balance deficits (not formally assessed)                                           Pertinent Vitals/Pain Pain Assessment: No/denies pain    Home Living Family/patient expects to be discharged to:: Private residence Living Arrangements: Alone Available Help at Discharge: Family Type of Home: Mobile home Home Access: Stairs to enter Entrance Stairs-Rails: Right Entrance Stairs-Number of Steps: 2 Home Layout: One level        Prior Function Level of Independence: Independent         Comments: Hydrographic surveyor, drives     Journalist, newspaper        Extremity/Trunk Assessment   Upper Extremity Assessment Upper Extremity Assessment: Overall WFL for tasks assessed    Lower Extremity Assessment Lower Extremity Assessment: Overall WFL for tasks assessed    Cervical / Trunk Assessment Cervical / Trunk Assessment: Normal  Communication   Communication: No difficulties  Cognition Arousal/Alertness: Awake/alert Behavior During Therapy: WFL for tasks assessed/performed Overall Cognitive Status: Within Functional Limits for tasks assessed                                        General Comments      Exercises     Assessment/Plan    PT Assessment Patent does not need any further PT services  PT Problem List         PT Treatment Interventions      PT Goals (Current goals can be found in the Care Plan section)  Acute Rehab PT Goals Patient Stated Goal: return home PT Goal Formulation: With patient Time For Goal Achievement: 05/20/18 Potential to Achieve Goals: Good    Frequency     Barriers  to discharge        Co-evaluation               AM-PAC PT "6 Clicks" Mobility  Outcome Measure Help needed turning from your back to your side while in a flat bed without using bedrails?: None Help needed moving from lying on your back to sitting on the side of a flat bed without using bedrails?: None Help needed moving to and from a bed to a chair (including a wheelchair)?: None Help needed standing up from a chair using your arms (e.g., wheelchair or bedside chair)?: None Help needed to walk in hospital room?: None Help needed climbing 3-5 steps with a railing? : None 6 Click Score: 24    End of Session   Activity Tolerance: Patient tolerated treatment well Patient left: in bed;with call bell/phone within reach Nurse Communication: Mobility status PT Visit  Diagnosis: Unsteadiness on feet (R26.81);Other abnormalities of gait and mobility (R26.89);Muscle weakness (generalized) (M62.81)    Time: 4782-9562 PT Time Calculation (min) (ACUTE ONLY): 28 min   Charges:   PT Evaluation $PT Eval Moderate Complexity: 1 Mod PT Treatments $Therapeutic Activity: 23-37 mins        3:48 PM, 05/20/18 Lonell Grandchild, MPT Physical Therapist with [facility] [phone] office [phone] mobile phone

## 2018-05-20 NOTE — ED Notes (Signed)
O2 removed , sats on RA at 94-95%

## 2018-05-20 NOTE — ED Notes (Signed)
PT working with pt, pt ambulating around the nurse desk

## 2018-05-20 NOTE — Care Management Obs Status (Signed)
[place] NOTIFICATION   Patient Details  Name: [name] MRN: [id number] Date of Birth: 09/12/1929   Medicare Observation Status Notification Given:  Yes    Shelda Altes 05/20/2018, 12:55 PM

## 2018-05-20 NOTE — Progress Notes (Signed)
PROGRESS NOTE    [name]  JOI:[id number]  DOB: 08/08/1929  DOA: 05/19/2018 PCP: Sharilyn Sites, MD   Brief Admission Hx:  85 y.o. male with medical history significant of chronic kidney insufficiency, cataracts who is coming to the emergency department with complaints of progressively worse yellowish producing sputum cough and dyspnea for the past week.    MDM/Assessment & Plan:   1. Acute COPD exacerbation - Pt says that he is already starting to feel better with treatments started in ED. Continue treatments.  Continue supportive therapy.   2. GERD - continue protonix.  3. Anemia - unspecified - follow anemia panel.  4. Pulmonary nodule - incidental finding - follow up with PCP.    DVT prophylaxis: lovenox  Code Status: FULL  Family Communication: patient at bedside Disposition Plan: Home tomorrow if better   Antimicrobials:  azithrymycin 1/2  Levofloxacin 1/3   Subjective: PT says that he is already starting to feel better with treatments started in ED.  No chest pain.    Objective: Vitals:   05/20/18 0630 05/20/18 0700 05/20/18 0800 05/20/18 0900  BP: 117/84 109/63 (!) 103/59 (!) 108/56  Pulse: 79 74 71 90  Resp: (!) 22 (!) 24 16 (!) 22  Temp:      TempSrc:      SpO2: 96% 97% 99% 97%  Weight:      Height:        Intake/Output Summary (Last 24 hours) at 05/20/2018 0941 Last data filed at 05/20/2018 0459 Gross per 24 hour  Intake 1598.22 ml  Output 450 ml  Net 1148.22 ml   Filed Weights   05/19/18 1727  Weight: 63 kg     REVIEW OF SYSTEMS  As per history otherwise all reviewed and reported negative  Exam:  General exam: awake, alert, NAD. Cooperative.  Respiratory system: diffuse bibasilar wheezing.  Cardiovascular system: S1 & S2 heard, RRR. No JVD, murmurs, gallops, clicks or pedal edema. Gastrointestinal system: Abdomen is nondistended, soft and nontender. Normal bowel sounds heard. Central nervous system: Alert and oriented. No focal  neurological deficits. Extremities: no CCE.  Data Reviewed: Basic Metabolic Panel: Recent Labs  Lab 05/19/18 1713 05/20/18 0510  NA 134* 137  K 3.9 3.9  CL 103 110  CO2 21* 19*  GLUCOSE 105* 158*  BUN 22 22  CREATININE 1.33* 1.18  CALCIUM 9.3 8.5*   Liver Function Tests: Recent Labs  Lab 05/20/18 0510  AST 14*  ALT 18  ALKPHOS 61  BILITOT 0.7  PROT 6.5  ALBUMIN 3.1*   No results for input(s): LIPASE, AMYLASE in the last 168 hours. No results for input(s): AMMONIA in the last 168 hours. CBC: Recent Labs  Lab 05/19/18 1713 05/20/18 0510  WBC 9.3 4.6  NEUTROABS  --  4.1  HGB 10.5* 9.1*  HCT 34.0* 28.5*  MCV 86.3 85.8  PLT 157 184   Cardiac Enzymes: No results for input(s): CKTOTAL, CKMB, CKMBINDEX, TROPONINI in the last 168 hours. CBG (last 3)  No results for input(s): GLUCAP in the last 72 hours. Recent Results (from the past 240 hour(s))  Blood Culture (routine x 2)     Status: None (Preliminary result)   Collection Time: 05/19/18  5:15 PM  Result Value Ref Range Status   Specimen Description LEFT ANTECUBITAL  Final   Special Requests   Final    BOTTLES DRAWN AEROBIC AND ANAEROBIC Blood Culture adequate volume   Culture   Final    NO GROWTH <  24 HOURS Performed at [facility], [address], [place], [place] [address]    Report Status PENDING  Incomplete  Blood Culture (routine x 2)     Status: None (Preliminary result)   Collection Time: 05/19/18  5:15 PM  Result Value Ref Range Status   Specimen Description BLOOD RIGHT FOREARM  Final   Special Requests   Final    BOTTLES DRAWN AEROBIC AND ANAEROBIC Blood Culture results may not be optimal due to an inadequate volume of blood received in culture bottles   Culture   Final    NO GROWTH < 24 HOURS Performed at [facility], [address], [place], [place] [address]    Report Status PENDING  Incomplete     Studies: Ct Angio Chest Pe W Or Wo Contrast  Result Date: 05/19/2018 CLINICAL DATA:  85 y/o  M; tachypnea and hypoxia. One week of cough. PE suspected. EXAM: CT ANGIOGRAPHY CHEST WITH CONTRAST TECHNIQUE: Multidetector CT imaging of the chest was performed using the standard protocol during bolus administration of intravenous contrast. Multiplanar CT image reconstructions and MIPs were obtained to evaluate the vascular anatomy. CONTRAST:  127mL ISOVUE-370 IOPAMIDOL (ISOVUE-370) INJECTION 76% COMPARISON:  09/23/2016 CT angiogram chest FINDINGS: Cardiovascular: Satisfactory opacification of the pulmonary arteries to the segmental level. No evidence of pulmonary embolism. Normal heart size. No pericardial effusion. Severe coronary artery and mild aortic calcific atherosclerosis. Mediastinum/Nodes: No enlarged mediastinal, hilar, or axillary lymph nodes. Thyroid gland, trachea, and esophagus demonstrate no significant findings. Lungs/Pleura: Diffuse peribronchial thickening with mucous plugging greatest in the lung bases. Mild bilateral lower lobe bronchiectasis. Clustered nodules in bronchovascular distribution in the lung bases. Separate discrete nodule in left upper lobe measuring 8 x 11 mm (series 15, image 76). No pleural effusion or pneumothorax. Mild centrilobular emphysema with upper lobe predominance. Mild biapical pleuroparenchymal scarring. Upper Abdomen: Stable hypodensities within the left kidney, partially visualize, compatible with cysts. Musculoskeletal: T6-9 chronic compression deformities with kyphosis. Nondisplaced bilateral chronic rib fractures. No acute osseous abnormality is evident. Review of the MIP images confirms the above findings. IMPRESSION: 1. No pulmonary embolus identified. 2. Peribronchial thickening, mucous plugging, and clustered nodules in the lung bases compatible with acute bronchitis/bronchiolitis. Aspiration is possible given distribution. 3. Mild bilateral lower lobe bronchiectasis. 4. New 11 mm nodule in left upper lobe, possibly infectious/inflammatory. Consider one of  the following in 3 months for both low-risk and high-risk individuals: (a) repeat chest CT, (b) follow-up PET-CT, or (c) tissue sampling. This recommendation follows the consensus statement: Guidelines for Management of Incidental Pulmonary Nodules Detected on CT Images: From the Fleischner Society 2017; Radiology 2017; 284:228-243. 5. Aortic Atherosclerosis (ICD10-I70.0) and Emphysema (ICD10-J43.9). 6. Severe coronary artery calcific atherosclerosis. Electronically Signed   By: Kristine Garbe M.D.   On: 05/19/2018 21:27   Dg Chest Port 1 View  Result Date: 05/19/2018 CLINICAL DATA:  Chest congestion, cough and shortness of breath. EXAM: PORTABLE CHEST 1 VIEW COMPARISON:  12/08/2017 FINDINGS: Cardiac silhouette is normal in size. No mediastinal or hilar masses. Lungs are hyperexpanded. There are prominent bronchovascular markings with mild peripheral lower lung zone interstitial thickening. No evidence of pneumonia or pulmonary edema. No pleural effusion or pneumothorax. Skeletal structures are grossly intact. IMPRESSION: 1. No acute cardiopulmonary disease. 2. COPD.  No change from the prior chest radiographs. Electronically Signed   By: Lajean Manes M.D.   On: 05/19/2018 17:05     Scheduled Meds: . enoxaparin (LOVENOX) injection  40 mg Subcutaneous Daily  . ipratropium-albuterol  3 mL Nebulization Q6H  . levofloxacin  500 mg Oral Daily  . methylPREDNISolone (SOLU-MEDROL) injection  40 mg Intravenous Q6H   Followed by  . [START ON 05/21/2018] predniSONE  40 mg Oral Q breakfast  . pantoprazole  40 mg Oral Daily   Continuous Infusions: . sodium chloride 100 mL/hr at 05/19/18 2342    Principal Problem:   COPD exacerbation ([place]) Active Problems:   GERD (gastroesophageal reflux disease)   Anemia   Pulmonary nodule   Bronchiectasis ([place])  Time spent:   Irwin Brakeman, MD [facility]  If 7PM-7AM, please contact night-coverage [email or web address] Password [email or web address] 05/20/2018, 9:41  AM    LOS: 0 days

## 2018-05-20 NOTE — Progress Notes (Signed)
Patient admitted to 334. He is awake oriented and able to walk to the bed. Will review orders and continue to monitor.

## 2018-05-21 DIAGNOSIS — J471 Bronchiectasis with (acute) exacerbation: Secondary | ICD-10-CM | POA: Diagnosis not present

## 2018-05-21 DIAGNOSIS — K219 Gastro-esophageal reflux disease without esophagitis: Secondary | ICD-10-CM | POA: Diagnosis not present

## 2018-05-21 DIAGNOSIS — R911 Solitary pulmonary nodule: Secondary | ICD-10-CM | POA: Diagnosis not present

## 2018-05-21 DIAGNOSIS — J441 Chronic obstructive pulmonary disease with (acute) exacerbation: Secondary | ICD-10-CM | POA: Diagnosis not present

## 2018-05-21 LAB — CBC WITH DIFFERENTIAL/PLATELET
Abs Immature Granulocytes: 0.28 10*3/uL — ABNORMAL HIGH (ref 0.00–0.07)
Basophils Absolute: 0.1 10*3/uL (ref 0.0–0.1)
Basophils Relative: 1 %
Eosinophils Absolute: 0 10*3/uL (ref 0.0–0.5)
Eosinophils Relative: 0 %
HCT: 26.8 % — ABNORMAL LOW (ref 39.0–52.0)
Hemoglobin: 8.2 g/dL — ABNORMAL LOW (ref 13.0–17.0)
Immature Granulocytes: 5 %
Lymphocytes Relative: 5 %
Lymphs Abs: 0.3 10*3/uL — ABNORMAL LOW (ref 0.7–4.0)
MCH: 26.5 pg (ref 26.0–34.0)
MCHC: 30.6 g/dL (ref 30.0–36.0)
MCV: 86.5 fL (ref 80.0–100.0)
Monocytes Absolute: 0.3 10*3/uL (ref 0.1–1.0)
Monocytes Relative: 5 %
Neutro Abs: 4.3 10*3/uL (ref 1.7–7.7)
Neutrophils Relative %: 84 %
Platelets: 153 10*3/uL (ref 150–400)
RBC: 3.1 MIL/uL — ABNORMAL LOW (ref 4.22–5.81)
RDW: 19.9 % — ABNORMAL HIGH (ref 11.5–15.5)
WBC: 5.2 10*3/uL (ref 4.0–10.5)
nRBC: 0 % (ref 0.0–0.2)

## 2018-05-21 MED ORDER — DEXTROMETHORPHAN POLISTIREX ER 30 MG/5ML PO SUER: 30.0000 mg | ORAL | 0 refills | Status: DC | PRN

## 2018-05-21 MED ORDER — PREDNISONE 5 MG PO TABS: 5.0000 mg | ORAL_TABLET | Freq: Every day | ORAL | Status: DC

## 2018-05-21 MED ORDER — ENSURE ENLIVE PO LIQD
237.0000 mL | Freq: Three times a day (TID) | ORAL | Status: DC
  Administered 2018-05-21: 237 mL via ORAL

## 2018-05-21 MED ORDER — ADULT MULTIVITAMIN W/MINERALS CH
1.0000 | ORAL_TABLET | Freq: Every day | ORAL | Status: DC
  Administered 2018-05-21: 1 via ORAL
  Filled 2018-05-21: qty 1

## 2018-05-21 MED ORDER — PREDNISONE 5 MG PO TABS: ORAL_TABLET | ORAL | 0 refills | Status: DC

## 2018-05-21 MED ORDER — LEVOFLOXACIN 500 MG PO TABS
250.0000 mg | ORAL_TABLET | Freq: Every day | ORAL | Status: DC
  Administered 2018-05-21: 250 mg via ORAL

## 2018-05-21 MED ORDER — ADULT MULTIVITAMIN W/MINERALS CH: 1.0000 | ORAL_TABLET | Freq: Every day | ORAL | Status: DC

## 2018-05-21 MED ORDER — ENSURE ENLIVE PO LIQD: 237.0000 mL | Freq: Three times a day (TID) | ORAL | 0 refills | Status: AC

## 2018-05-21 MED ORDER — ALBUTEROL SULFATE HFA 108 (90 BASE) MCG/ACT IN AERS: 2.0000 | INHALATION_SPRAY | RESPIRATORY_TRACT | 0 refills | Status: DC | PRN

## 2018-05-21 MED ORDER — PREDNISONE 20 MG PO TABS: 40.0000 mg | ORAL_TABLET | Freq: Every day | ORAL | 0 refills | Status: DC

## 2018-05-21 MED ORDER — LEVOFLOXACIN 250 MG PO TABS: 250.0000 mg | ORAL_TABLET | Freq: Every day | ORAL | 0 refills | Status: AC

## 2018-05-21 MED ORDER — ENSURE SURGERY PO LIQD: 237.0000 mL | Freq: Three times a day (TID) | ORAL | Status: DC

## 2018-05-21 NOTE — Progress Notes (Signed)
Initial Nutrition Assessment  DOCUMENTATION CODES:   Underweight(Will assess for malnutrition at follow-up)  INTERVENTION:  - Will order Ensure Enlive TID, each supplement provides 350 kcal and 20 grams of protein. - Will order daily multivitamin with minerals. - Continue to encourage PO intakes.   NUTRITION DIAGNOSIS:   Underweight related to chronic illness as evidenced by other (comment)(BMI <18.5 kg/m2).  GOAL:   Patient will meet greater than or equal to 90% of their needs  MONITOR:   PO intake, Supplement acceptance, Weight trends, Labs  REASON FOR ASSESSMENT:   Consult Assessment of nutrition requirement/status  ASSESSMENT:   85 y.o. male with medical history significant of chronic kidney insufficiency, cataracts, HTN, anemia, and COPD. He presented to the ED with complaints of progressively worse yellowish producing sputum cough and dyspnea for the past week.    No intakes documented since admission. No RD on site on the weekends; all information obtained from the chart at this time.  CSW's note from yesterday afternoon states that daughter is concerned that patient will not eat well or drink well enough to stay hydrated after d/c.   Per chart review, current weight is 151 lb, which is up from weight  of ~144 lb from 07/30/17-12/08/17.   Medications reviewed; 40 mg deltasone/day. Labs reviewed; Ca: 8.5 mg/dL, GFR: 56 mL/min. IVF; NS @ 50 mL/hr.     NUTRITION - FOCUSED PHYSICAL EXAM:  Will attempt at follow-up.   Diet Order:   Diet Order            Diet Heart Room service appropriate? Yes; Fluid consistency: Thin  Diet effective now              EDUCATION NEEDS:   Not appropriate for education at this time  Skin:  Skin Assessment: Reviewed RN Assessment  Last BM:  1/2  Height:   Ht Readings from Last 1 Encounters:  05/20/18 6\' 4"  (1.93 m)    Weight:   Wt Readings from Last 1 Encounters:  05/20/18 68.4 kg    Ideal Body Weight:  91.82  kg  BMI:  Body mass index is 18.36 kg/m.  Estimated Nutritional Needs:   Kcal:  1900-2100 kcal  Protein:  80-90 grams  Fluid:  >/= 1.8 L/day     Jarome Matin, MS, RD, LDN, [facility] Inpatient Clinical Dietitian Pager # [phone] After hours/weekend pager # [phone]

## 2018-05-21 NOTE — Discharge Summary (Signed)
Physician Discharge Summary  [name] YQM:[id number] DOB: 1929-10-04 DOA: 05/19/2018  PCP: Sharilyn Sites, MD  Admit date: 05/19/2018 Discharge date: 05/21/2018  Admitted From: Home Disposition: Home Recommendations for Outpatient Follow-up:  1. Follow up with PCP in 1 weeks 2. Please have repeat CT lungs done in 3 months to follow up pulmonary nodule.  3. Follow up with your doctors in [place] as scheduled.   Discharge Condition: STABLE   CODE STATUS: FULL    Brief Hospitalization Summary: Please see all hospital notes, images, labs for full details of the hospitalization. HPI: [name] is a 85 y.o. male with medical history significant of chronic kidney insufficiency, cataracts who is coming to the emergency department with complaints of progressively worse yellowish producing sputum cough and dyspnea for the past week.  Per patient, on December 26 while he was visiting his daughter in [place], he developed a sore throat.  Discontinue for the following dates.  He saw a physician there, but he was diagnosed with a viral illness and did not need any medications at that time.  However, his symptoms progressed and he developed a cough with yellowish producing sputum associated with progressively worse dyspnea, fatigue and decreased appetite.  He saw a physician today who referred him to the emergency department after he noticed he was very tachypneic.  He denies fever, chills, rhinorrhea, wheezing, hemoptysis, chest pain, palpitations, dizziness, diaphoresis, PND, orthopnea or pitting edema of the lower extremities.  He denies abdominal pain, nausea, emesis, diarrhea, constipation, melena or hematochezia.  No dysuria, frequency or hematuria.  No polyuria, polydipsia, polyphagia or blurred vision.  No heat or cold intolerance.  Denies skin rashes or pruritus.  ED Course: Initial vital signs were temperature 97.8 F, pulse 94, respirations 40, blood pressure 111/71 mmHg and O2 sat 95%  on room air.  Patient was fluid on BiPAP ventilation, given bronchodilators, ceftriaxone, azithromycin IV and Solu-Medrol 125 mg IVP.  Urinalysis showed increase of specific gravity, small hemoglobinuria, ketones of 20 mg/dL and trace leukocyte esterase.  Initial lactic acid was 1.91 and repeat was 1.0 mmol/L.  Troponin 0 0.03.  White count is 9.3, hemoglobin 10.5 g/dL and platelets 257.  Venous gas showed a PCO2 of 43.9 mmHg and acid base is 2.3 mmol/L, otherwise was normal.  Sodium 134, potassium 3.9, chloride 103 and CO2 21 mmol/L.  BUN was 22, creatinine 1.33, calcium 9.3 and glucose 105 mg/dL.  Imaging: Chest radiograph with unchanged COPD and did not show any acute cardiopulmonary disease.  CTA chest did not show any pulmonary embolism.  There was peribronchial thickening, mucous plugging and clustering nodules in the lung bases compatible with acute bronchitis/from acute colitis.  Questionable aspiration.  There was mild bilateral lower lobe bronchiectasis.  There was a new 11 mm nodule in the left upper lobe, possibly infectious/inflammatory.  Consider repeating CT in 3 months.    Brief Admission Hx: 85 y.o.malewith medical history significant ofchronic kidney insufficiency, cataracts who is coming to the emergency department with complaints of progressively worse yellowish producing sputum cough and dyspnea for the past week.    MDM/Assessment & Plan:   1. Acute COPD exacerbation - Pt says that he is feeling better and he wants to go home. He says that he is breathing much better.  He wants to go to church tomorrow.   He has been weaned to room air.  He has been ambulating in room without any difficulty.  He will discharge on prednisone burst, take 5  tabs daily x 5 days then resume 1 tab daily of his 5 mg tabs of prednisone.  Finish 3 more days of levofloxacin.    2. GERD - resume home therapy.  3. MDS he is treated in [place] for this and will follow up with his doctors there.     4. Pulmonary nodule - incidental finding - repeat CT lungs in 3 months recommended.  I discussed this with his daughter who says she will be sure he gets done and I discussed with patient who verbalized understanding.     DVT prophylaxis: lovenox  Code Status: FULL  Family Communication: patient at bedside / daughter by telephone (in [place]) Disposition Plan: Home   Antimicrobials:  azithrymycin 1/2  Levofloxacin 1/3   Discharge Diagnoses:  Principal Problem:   COPD exacerbation ([place]) Active Problems:   GERD (gastroesophageal reflux disease)   Anemia   Pulmonary nodule   Bronchiectasis ([facility])   COPD with acute exacerbation [facility])    Discharge Instructions: Discharge Instructions    Call MD for:  difficulty breathing, headache or visual disturbances   Complete by:  As directed    Call MD for:  extreme fatigue   Complete by:  As directed      Allergies as of 05/21/2018   No Known Allergies     Medication List    TAKE these medications   albuterol 108 (90 Base) MCG/ACT inhaler Commonly known as:  PROVENTIL HFA;VENTOLIN HFA Inhale 2 puffs into the lungs every 4 (four) hours as needed for wheezing or shortness of breath.   dextromethorphan 30 MG/5ML liquid Commonly known as:  DELSYM Take 5 mLs (30 mg total) by mouth as needed for cough.   feeding supplement (ENSURE ENLIVE) Liqd Take 237 mLs by mouth 3 (three) times daily between meals for 14 days.   levofloxacin 250 MG tablet Commonly known as:  LEVAQUIN Take 1 tablet (250 mg total) by mouth daily for 3 days. Start taking on:  May 22, 2018   multivitamin with minerals Tabs tablet Take 1 tablet by mouth daily. Start taking on:  May 22, 2018   predniSONE 5 MG tablet Commonly known as:  DELTASONE Take 5 tablets daily with breakfast for 5 days, then resume 1 tablet daily Start taking on:  May 22, 2018 What changed:    how much to take  how to take this  when to take this  additional  instructions   ZANTAC PO Take 1 tablet by mouth daily as needed (for heartburn).      Follow-up Information    Sharilyn Sites, MD. Schedule an appointment as soon as possible for a visit in 5 day(s).   Specialty:  Family Medicine Why:  Hospital Follow Up  Contact information: [place] [address] [phone]          No Known Allergies Allergies as of 05/21/2018   No Known Allergies     Medication List    TAKE these medications   albuterol 108 (90 Base) MCG/ACT inhaler Commonly known as:  PROVENTIL HFA;VENTOLIN HFA Inhale 2 puffs into the lungs every 4 (four) hours as needed for wheezing or shortness of breath.   dextromethorphan 30 MG/5ML liquid Commonly known as:  DELSYM Take 5 mLs (30 mg total) by mouth as needed for cough.   feeding supplement (ENSURE ENLIVE) Liqd Take 237 mLs by mouth 3 (three) times daily between meals for 14 days.   levofloxacin 250 MG tablet Commonly known as:  LEVAQUIN Take  1 tablet (250 mg total) by mouth daily for 3 days. Start taking on:  May 22, 2018   multivitamin with minerals Tabs tablet Take 1 tablet by mouth daily. Start taking on:  May 22, 2018   predniSONE 5 MG tablet Commonly known as:  DELTASONE Take 5 tablets daily with breakfast for 5 days, then resume 1 tablet daily Start taking on:  May 22, 2018 What changed:    how much to take  how to take this  when to take this  additional instructions   ZANTAC PO Take 1 tablet by mouth daily as needed (for heartburn).       Procedures/Studies: Ct Angio Chest Pe W Or Wo Contrast  Result Date: 05/19/2018 CLINICAL DATA:  85 y/o M; tachypnea and hypoxia. One week of cough. PE suspected. EXAM: CT ANGIOGRAPHY CHEST WITH CONTRAST TECHNIQUE: Multidetector CT imaging of the chest was performed using the standard protocol during bolus administration of intravenous contrast. Multiplanar CT image reconstructions and MIPs were obtained to evaluate the  vascular anatomy. CONTRAST:  135mL ISOVUE-370 IOPAMIDOL (ISOVUE-370) INJECTION 76% COMPARISON:  09/23/2016 CT angiogram chest FINDINGS: Cardiovascular: Satisfactory opacification of the pulmonary arteries to the segmental level. No evidence of pulmonary embolism. Normal heart size. No pericardial effusion. Severe coronary artery and mild aortic calcific atherosclerosis. Mediastinum/Nodes: No enlarged mediastinal, hilar, or axillary lymph nodes. Thyroid gland, trachea, and esophagus demonstrate no significant findings. Lungs/Pleura: Diffuse peribronchial thickening with mucous plugging greatest in the lung bases. Mild bilateral lower lobe bronchiectasis. Clustered nodules in bronchovascular distribution in the lung bases. Separate discrete nodule in left upper lobe measuring 8 x 11 mm (series 15, image 76). No pleural effusion or pneumothorax. Mild centrilobular emphysema with upper lobe predominance. Mild biapical pleuroparenchymal scarring. Upper Abdomen: Stable hypodensities within the left kidney, partially visualize, compatible with cysts. Musculoskeletal: T6-9 chronic compression deformities with kyphosis. Nondisplaced bilateral chronic rib fractures. No acute osseous abnormality is evident. Review of the MIP images confirms the above findings. IMPRESSION: 1. No pulmonary embolus identified. 2. Peribronchial thickening, mucous plugging, and clustered nodules in the lung bases compatible with acute bronchitis/bronchiolitis. Aspiration is possible given distribution. 3. Mild bilateral lower lobe bronchiectasis. 4. New 11 mm nodule in left upper lobe, possibly infectious/inflammatory. Consider one of the following in 3 months for both low-risk and high-risk individuals: (a) repeat chest CT, (b) follow-up PET-CT, or (c) tissue sampling. This recommendation follows the consensus statement: Guidelines for Management of Incidental Pulmonary Nodules Detected on CT Images: From the Fleischner Society 2017; Radiology  2017; 284:228-243. 5. Aortic Atherosclerosis (ICD10-I70.0) and Emphysema (ICD10-J43.9). 6. Severe coronary artery calcific atherosclerosis. Electronically Signed   By: Kristine Garbe M.D.   On: 05/19/2018 21:27   Dg Chest Port 1 View  Result Date: 05/19/2018 CLINICAL DATA:  Chest congestion, cough and shortness of breath. EXAM: PORTABLE CHEST 1 VIEW COMPARISON:  12/08/2017 FINDINGS: Cardiac silhouette is normal in size. No mediastinal or hilar masses. Lungs are hyperexpanded. There are prominent bronchovascular markings with mild peripheral lower lung zone interstitial thickening. No evidence of pneumonia or pulmonary edema. No pleural effusion or pneumothorax. Skeletal structures are grossly intact. IMPRESSION: 1. No acute cardiopulmonary disease. 2. COPD.  No change from the prior chest radiographs. Electronically Signed   By: Lajean Manes M.D.   On: 05/19/2018 17:05      Subjective: Patient says he feels much better he is breathing much better and would like to go home today.  Discharge Exam: Vitals:   05/20/18 2116 05/21/18 2409  BP: (!) 100/53 (!) 93/56  Pulse: 65 60  Resp: 18 18  Temp: 98.2 F (36.8 C) 97.6 F (36.4 C)  SpO2: 93% 93%   Vitals:   05/20/18 1907 05/20/18 2110 05/20/18 2116 05/21/18 0555  BP: 105/60  (!) 100/53 (!) 93/56  Pulse: 79  65 60  Resp: 19  18 18   Temp: 98.4 F (36.9 C)  98.2 F (36.8 C) 97.6 F (36.4 C)  TempSrc: Oral  Oral Oral  SpO2: 98% (!) 88% 93% 93%  Weight: 68.4 kg     Height: 6\' 4"  (1.93 m)       General: Pt is alert, awake, not in acute distress Cardiovascular: RRR, S1/S2 +, no rubs, no gallops Respiratory: CTA bilaterally, no wheezing, no rhonchi Abdominal: Soft, NT, ND, bowel sounds + Extremities: no edema, no cyanosis   The results of significant diagnostics from this hospitalization (including imaging, microbiology, ancillary and laboratory) are listed below for reference.     Microbiology: Recent Results (from the  past 240 hour(s))  Blood Culture (routine x 2)     Status: None (Preliminary result)   Collection Time: 05/19/18  5:15 PM  Result Value Ref Range Status   Specimen Description LEFT ANTECUBITAL  Final   Special Requests   Final    BOTTLES DRAWN AEROBIC AND ANAEROBIC Blood Culture adequate volume   Culture   Final    NO GROWTH 2 DAYS Performed at [facility], [address], [place], [place] [address]    Report Status PENDING  Incomplete  Blood Culture (routine x 2)     Status: None (Preliminary result)   Collection Time: 05/19/18  5:15 PM  Result Value Ref Range Status   Specimen Description BLOOD RIGHT FOREARM  Final   Special Requests   Final    BOTTLES DRAWN AEROBIC AND ANAEROBIC Blood Culture results may not be optimal due to an inadequate volume of blood received in culture bottles   Culture   Final    NO GROWTH 2 DAYS Performed at [facility], [address], [place], [place] [address]    Report Status PENDING  Incomplete     Labs: BNP (last 3 results) No results for input(s): BNP in the last 8760 hours. Basic Metabolic Panel: Recent Labs  Lab 05/19/18 1713 05/20/18 0510  NA 134* 137  K 3.9 3.9  CL 103 110  CO2 21* 19*  GLUCOSE 105* 158*  BUN 22 22  CREATININE 1.33* 1.18  CALCIUM 9.3 8.5*   Liver Function Tests: Recent Labs  Lab 05/20/18 0510  AST 14*  ALT 18  ALKPHOS 61  BILITOT 0.7  PROT 6.5  ALBUMIN 3.1*   No results for input(s): LIPASE, AMYLASE in the last 168 hours. No results for input(s): AMMONIA in the last 168 hours. CBC: Recent Labs  Lab 05/19/18 1713 05/20/18 0510 05/21/18 0632  WBC 9.3 4.6 5.2  NEUTROABS  --  4.1 4.3  HGB 10.5* 9.1* 8.2*  HCT 34.0* 28.5* 26.8*  MCV 86.3 85.8 86.5  PLT 157 184 153   Cardiac Enzymes: No results for input(s): CKTOTAL, CKMB, CKMBINDEX, TROPONINI in the last 168 hours. BNP: Invalid input(s): POCBNP CBG: No results for input(s): GLUCAP in the last 168 hours. D-Dimer No results for input(s): DDIMER  in the last 72 hours. Hgb A1c No results for input(s): HGBA1C in the last 72 hours. Lipid Profile No results for input(s): CHOL, HDL, LDLCALC, TRIG, CHOLHDL, LDLDIRECT in the last 72 hours. Thyroid function studies No  results for input(s): TSH, T4TOTAL, T3FREE, THYROIDAB in the last 72 hours.  Invalid input(s): FREET3 Anemia work up Recent Labs    05/20/18 0510  VITAMINB12 1,200*  FOLATE 14.1  FERRITIN 373*  TIBC 171*  IRON 21*  RETICCTPCT 1.2   Urinalysis    Component Value Date/Time   COLORURINE YELLOW 05/20/2018 0037   APPEARANCEUR CLEAR 05/20/2018 0037   LABSPEC >1.046 (H) 05/20/2018 0037   PHURINE 5.0 05/20/2018 0037   GLUCOSEU NEGATIVE 05/20/2018 0037   HGBUR SMALL (A) 05/20/2018 0037   BILIRUBINUR NEGATIVE 05/20/2018 0037   KETONESUR 20 (A) 05/20/2018 0037   PROTEINUR NEGATIVE 05/20/2018 0037   UROBILINOGEN 0.2 08/15/2013 1240   NITRITE NEGATIVE 05/20/2018 0037   LEUKOCYTESUR TRACE (A) 05/20/2018 0037   Sepsis Labs Invalid input(s): PROCALCITONIN,  WBC,  LACTICIDVEN Microbiology Recent Results (from the past 240 hour(s))  Blood Culture (routine x 2)     Status: None (Preliminary result)   Collection Time: 05/19/18  5:15 PM  Result Value Ref Range Status   Specimen Description LEFT ANTECUBITAL  Final   Special Requests   Final    BOTTLES DRAWN AEROBIC AND ANAEROBIC Blood Culture adequate volume   Culture   Final    NO GROWTH 2 DAYS Performed at [facility], [address] [place], [place] [address]    Report Status PENDING  Incomplete  Blood Culture (routine x 2)     Status: None (Preliminary result)   Collection Time: 05/19/18  5:15 PM  Result Value Ref Range Status   Specimen Description BLOOD RIGHT FOREARM  Final   Special Requests   Final    BOTTLES DRAWN AEROBIC AND ANAEROBIC Blood Culture results may not be optimal due to an inadequate volume of blood received in culture bottles   Culture   Final    NO GROWTH 2 DAYS Performed at [facility], [address], [place], [place] [address]    Report Status PENDING  Incomplete   Time coordinating discharge:   SIGNED:  Irwin Brakeman, MD  [facility] 05/21/2018, 1:00 PM Pager [phone]  If 7PM-7AM, please contact night-coverage [email or web address] Password [email or web address]

## 2018-05-21 NOTE — Progress Notes (Signed)
PHARMACY NOTE:  ANTIMICROBIAL RENAL DOSAGE ADJUSTMENT  Current antimicrobial regimen includes a mismatch between antimicrobial dosage and estimated renal function.  As per policy approved by the Pharmacy & Therapeutics and Medical Executive Committees, the antimicrobial dosage will be adjusted accordingly.  Current antimicrobial dosage:  Levaquin 500mg  po daily  Indication: COPD exacerbation  Renal Function:  Estimated Creatinine Clearance: 43.5 mL/min (by C-G formula based on SCr of 1.18 mg/dL). []      On intermittent HD, scheduled: []      On CRRT    Antimicrobial dosage has been changed to:  Levaquin 250mg  daily     Thank you for allowing pharmacy to be a part of this patient's care.  Despina Pole, [facility] 05/21/2018 10:57 AM

## 2018-05-21 NOTE — Progress Notes (Signed)
Patient given discharge instructions and all questions answered. PIV x1 removed per order. Patient states he will go from hospital to pharmacy to pick up antibiotics and inhalers.

## 2018-05-21 NOTE — Discharge Instructions (Signed)
Please take higher dose of prednisone for 5 days then resume regular dose of 5 mg daily after that.  Please have repeat CT lungs done in 3 months to follow up pulmonary nodule.    Chronic Obstructive Pulmonary Disease Exacerbation  Chronic obstructive pulmonary disease (COPD) is a long-term (chronic) condition that affects the lungs. COPD is a general term that can be used to describe many different lung problems that cause lung swelling (inflammation) and limit airflow, including chronic bronchitis and emphysema. COPD exacerbations are episodes when breathing symptoms become much worse and require extra treatment. COPD exacerbations are usually caused by infections. Without treatment, COPD exacerbations can be severe and even life threatening. Frequent COPD exacerbations can cause further damage to the lungs. What are the causes? This condition may be caused by:  Respiratory infections, including viral and bacterial infections.  Exposure to smoke.  Exposure to air pollution, chemical fumes, or dust.  Things that give you an allergic reaction (allergens).  Not taking your usual COPD medicines as directed.  Underlying medical problems, such as congestive heart failure or infections not involving the lungs. In many cases, the cause (trigger) of this condition is not known. What increases the risk? The following factors may make you more likely to develop this condition:  Smoking cigarettes.  Old age.  Frequent prior COPD exacerbations. What are the signs or symptoms? Symptoms of this condition include:  Increased coughing.  Increased production of mucus from your lungs (sputum).  Increased wheezing.  Increased shortness of breath.  Rapid or labored breathing.  Chest tightness.  Less energy than usual.  Sleep disruption from symptoms.  Confusion or increased sleepiness. Often these symptoms happen or get worse even with the use of medicines. How is this  diagnosed? This condition is diagnosed based on:  Your medical history.  A physical exam. You may also have tests, including:  A chest X-ray.  Blood tests.  Lung (pulmonary) function tests. How is this treated? Treatment for this condition depends on the severity and cause of the symptoms. You may need to be admitted to a hospital for treatment. Some of the treatments commonly used to treat COPD exacerbations are:  Antibiotic medicines. These may be used for severe exacerbations caused by a lung infection, such as pneumonia.  Bronchodilators. These are inhaled medicines that expand the air passages and allow increased airflow.  Steroid medicines. These act to reduce inflammation in the airways. They may be given with an inhaler, taken by mouth, or given through an IV tube inserted into one of your veins.  Supplemental oxygen therapy.  Airway clearing techniques, such as noninvasive ventilation (NIV) and positive expiratory pressure (PEP). These provide respiratory support through a mask or other noninvasive device. An example of this would be using a continuous positive airway pressure (CPAP) machine to improve delivery of oxygen into your lungs. Follow these instructions at home: Medicines  Take over-the-counter and prescription medicines only as told by your health care provider. It is important to use correct technique with inhaled medicines.  If you were prescribed an antibiotic medicine or oral steroid, take it as told by your health care provider. Do not stop taking the medicine even if you start to feel better. Lifestyle  Eat a healthy diet.  Exercise regularly.  Get plenty of sleep.  Avoid exposure to all substances that irritate the airway, especially to tobacco smoke.  Wash your hands often with soap and water to reduce the risk of infection. If soap and water  are not available, use hand sanitizer.  During flu season, avoid enclosed spaces that are crowded with  people. General instructions  Drink enough fluid to keep your urine clear or pale yellow (unless you have a medical condition that requires fluid restriction).  Use a cool mist vaporizer. This humidifies the air and makes it easier for you to clear your chest when you cough.  If you have a home nebulizer and oxygen, continue to use them as told by your health care provider.  Keep all follow-up visits as told by your health care provider. This is important. How is this prevented?  Stay up-to-date on pneumococcal and influenza (flu) vaccines. A flu shot is recommended every year to help prevent exacerbations.  Do not use any products that contain nicotine or tobacco, such as cigarettes and e-cigarettes. Quitting smoking is very important in preventing COPD from getting worse and in preventing exacerbations from happening as often. If you need help quitting, ask your health care provider.  Follow all instructions for pulmonary rehabilitation after a recent exacerbation. This can help prevent future exacerbations.  Work with your health care provider to develop and follow an action plan. This tells you what steps to take when you experience certain symptoms. Contact a health care provider if:  You have a worsening of your regular COPD symptoms. Get help right away if:  You have worsening shortness of breath, even when resting.  You have trouble talking.  You have severe chest pain.  You cough up blood.  You have a fever.  You have weakness, vomit repeatedly, or faint.  You feel confused.  You are not able to sleep because of your symptoms.  You have trouble doing daily activities. Summary  COPD exacerbations are episodes when breathing symptoms become much worse and require extra treatment above your normal treatment.  Exacerbations can be severe and even life threatening. Frequent COPD exacerbations can cause further damage to your lungs.  COPD exacerbations are usually  triggered by infections such as the flu, colds, and even pneumonia.  Treatment for this condition depends on the severity and cause of the symptoms. You may need to be admitted to a hospital for treatment.  Quitting smoking is very important to prevent COPD from getting worse and to prevent exacerbations from happening as often. This information is not intended to replace advice given to you by your health care provider. Make sure you discuss any questions you have with your health care provider. Document Released: 03/01/2007 Document Revised: 10/28/2016 Document Reviewed: 06/08/2016 Elsevier Interactive Patient Education  2019 [employer]   Cough, Adult  A cough helps to clear your throat and lungs. A cough may last only 2-3 weeks (acute), or it may last longer than 8 weeks (chronic). Many different things can cause a cough. A cough may be a sign of an illness or another medical condition. Follow these instructions at home:  Pay attention to any changes in your cough.  Take medicines only as told by your doctor. ? If you were prescribed an antibiotic medicine, take it as told by your doctor. Do not stop taking it even if you start to feel better. ? Talk with your doctor before you try using a cough medicine.  Drink enough fluid to keep your pee (urine) clear or pale yellow.  If the air is dry, use a cold steam vaporizer or humidifier in your home.  Stay away from things that make you cough at work or at home.  If  your cough is worse at night, try using extra pillows to raise your head up higher while you sleep.  Do not smoke, and try not to be around smoke. If you need help quitting, ask your doctor.  Do not have caffeine.  Do not drink alcohol.  Rest as needed. Contact a doctor if:  You have new problems (symptoms).  You cough up yellow fluid (pus).  Your cough does not get better after 2-3 weeks, or your cough gets worse.  Medicine does not help your cough and you  are not sleeping well.  You have pain that gets worse or pain that is not helped with medicine.  You have a fever.  You are losing weight and you do not know why.  You have night sweats. Get help right away if:  You cough up blood.  You have trouble breathing.  Your heartbeat is very fast. This information is not intended to replace advice given to you by your health care provider. Make sure you discuss any questions you have with your health care provider. Document Released: 01/15/2011 Document Revised: 10/10/2015 Document Reviewed: 07/11/2014 Elsevier Interactive Patient Education  2019 [employer].

## 2018-05-24 DIAGNOSIS — H26492 Other secondary cataract, left eye: Secondary | ICD-10-CM | POA: Diagnosis not present

## 2018-05-24 LAB — CULTURE, BLOOD (ROUTINE X 2)
Culture: NO GROWTH
Culture: NO GROWTH
Special Requests: ADEQUATE

## 2018-05-26 DIAGNOSIS — Z681 Body mass index (BMI) 19 or less, adult: Secondary | ICD-10-CM | POA: Diagnosis not present

## 2018-05-26 DIAGNOSIS — J449 Chronic obstructive pulmonary disease, unspecified: Secondary | ICD-10-CM | POA: Diagnosis not present

## 2018-06-23 DIAGNOSIS — D469 Myelodysplastic syndrome, unspecified: Secondary | ICD-10-CM | POA: Diagnosis not present

## 2018-06-23 DIAGNOSIS — D46Z Other myelodysplastic syndromes: Secondary | ICD-10-CM | POA: Diagnosis not present

## 2018-07-05 DIAGNOSIS — R0602 Shortness of breath: Secondary | ICD-10-CM | POA: Diagnosis not present

## 2018-07-06 DIAGNOSIS — D469 Myelodysplastic syndrome, unspecified: Secondary | ICD-10-CM | POA: Diagnosis not present

## 2018-07-06 DIAGNOSIS — R911 Solitary pulmonary nodule: Secondary | ICD-10-CM | POA: Diagnosis not present

## 2018-07-08 DIAGNOSIS — R0602 Shortness of breath: Secondary | ICD-10-CM | POA: Diagnosis not present

## 2018-07-08 DIAGNOSIS — J449 Chronic obstructive pulmonary disease, unspecified: Secondary | ICD-10-CM | POA: Diagnosis not present

## 2018-07-08 DIAGNOSIS — R911 Solitary pulmonary nodule: Secondary | ICD-10-CM | POA: Diagnosis not present

## 2018-08-03 DIAGNOSIS — D469 Myelodysplastic syndrome, unspecified: Secondary | ICD-10-CM | POA: Diagnosis not present

## 2018-08-03 DIAGNOSIS — R911 Solitary pulmonary nodule: Secondary | ICD-10-CM | POA: Diagnosis not present

## 2018-08-04 DIAGNOSIS — D46Z Other myelodysplastic syndromes: Secondary | ICD-10-CM | POA: Diagnosis not present

## 2018-10-20 DIAGNOSIS — Z681 Body mass index (BMI) 19 or less, adult: Secondary | ICD-10-CM | POA: Diagnosis not present

## 2018-10-20 DIAGNOSIS — J984 Other disorders of lung: Secondary | ICD-10-CM | POA: Diagnosis not present

## 2018-10-20 DIAGNOSIS — Z1389 Encounter for screening for other disorder: Secondary | ICD-10-CM | POA: Diagnosis not present

## 2018-12-05 DIAGNOSIS — K219 Gastro-esophageal reflux disease without esophagitis: Secondary | ICD-10-CM | POA: Diagnosis not present

## 2018-12-05 DIAGNOSIS — R627 Adult failure to thrive: Secondary | ICD-10-CM | POA: Diagnosis not present

## 2018-12-05 DIAGNOSIS — M810 Age-related osteoporosis without current pathological fracture: Secondary | ICD-10-CM | POA: Diagnosis not present

## 2018-12-05 DIAGNOSIS — R911 Solitary pulmonary nodule: Secondary | ICD-10-CM | POA: Diagnosis not present

## 2018-12-05 DIAGNOSIS — D469 Myelodysplastic syndrome, unspecified: Secondary | ICD-10-CM | POA: Diagnosis not present

## 2018-12-16 ENCOUNTER — Other Ambulatory Visit: Payer: Self-pay

## 2018-12-16 DIAGNOSIS — R911 Solitary pulmonary nodule: Secondary | ICD-10-CM | POA: Diagnosis not present

## 2018-12-16 DIAGNOSIS — D469 Myelodysplastic syndrome, unspecified: Secondary | ICD-10-CM | POA: Diagnosis not present

## 2019-01-04 DIAGNOSIS — R911 Solitary pulmonary nodule: Secondary | ICD-10-CM | POA: Diagnosis not present

## 2019-01-04 DIAGNOSIS — D469 Myelodysplastic syndrome, unspecified: Secondary | ICD-10-CM | POA: Diagnosis not present

## 2019-01-12 DIAGNOSIS — C44222 Squamous cell carcinoma of skin of right ear and external auricular canal: Secondary | ICD-10-CM | POA: Diagnosis not present

## 2019-01-12 DIAGNOSIS — X32XXXA Exposure to sunlight, initial encounter: Secondary | ICD-10-CM | POA: Diagnosis not present

## 2019-01-12 DIAGNOSIS — L570 Actinic keratosis: Secondary | ICD-10-CM | POA: Diagnosis not present

## 2019-02-09 DIAGNOSIS — Z85828 Personal history of other malignant neoplasm of skin: Secondary | ICD-10-CM | POA: Diagnosis not present

## 2019-02-09 DIAGNOSIS — Z08 Encounter for follow-up examination after completed treatment for malignant neoplasm: Secondary | ICD-10-CM | POA: Diagnosis not present

## 2019-02-27 DIAGNOSIS — R627 Adult failure to thrive: Secondary | ICD-10-CM | POA: Diagnosis not present

## 2019-02-27 DIAGNOSIS — K219 Gastro-esophageal reflux disease without esophagitis: Secondary | ICD-10-CM | POA: Diagnosis not present

## 2019-02-27 DIAGNOSIS — M810 Age-related osteoporosis without current pathological fracture: Secondary | ICD-10-CM | POA: Diagnosis not present

## 2019-02-27 DIAGNOSIS — D469 Myelodysplastic syndrome, unspecified: Secondary | ICD-10-CM | POA: Diagnosis not present

## 2019-02-27 DIAGNOSIS — R911 Solitary pulmonary nodule: Secondary | ICD-10-CM | POA: Diagnosis not present

## 2019-03-12 ENCOUNTER — Other Ambulatory Visit: Payer: Self-pay

## 2019-03-12 ENCOUNTER — Emergency Department (HOSPITAL_COMMUNITY)
Admission: EM | Admit: 2019-03-12 | Discharge: 2019-03-12 | Disposition: A | Discharge status: Home / Self Care | Payer: Medicare Other | Attending: Emergency Medicine | Admitting: Emergency Medicine

## 2019-03-12 ENCOUNTER — Encounter (HOSPITAL_COMMUNITY): Payer: Self-pay | Admitting: Emergency Medicine

## 2019-03-12 DIAGNOSIS — Z87891 Personal history of nicotine dependence: Secondary | ICD-10-CM | POA: Insufficient documentation

## 2019-03-12 DIAGNOSIS — Z79899 Other long term (current) drug therapy: Secondary | ICD-10-CM | POA: Insufficient documentation

## 2019-03-12 DIAGNOSIS — M62830 Muscle spasm of back: Secondary | ICD-10-CM | POA: Insufficient documentation

## 2019-03-12 DIAGNOSIS — J449 Chronic obstructive pulmonary disease, unspecified: Secondary | ICD-10-CM | POA: Diagnosis not present

## 2019-03-12 DIAGNOSIS — M545 Low back pain: Secondary | ICD-10-CM | POA: Diagnosis present

## 2019-03-12 DIAGNOSIS — N189 Chronic kidney disease, unspecified: Secondary | ICD-10-CM | POA: Insufficient documentation

## 2019-03-12 MED ORDER — METHOCARBAMOL 500 MG PO TABS: 500.0000 mg | ORAL_TABLET | Freq: Three times a day (TID) | ORAL | 0 refills | Status: DC

## 2019-03-12 MED ORDER — TRAMADOL HCL 50 MG PO TABS: 50.0000 mg | ORAL_TABLET | Freq: Four times a day (QID) | ORAL | 0 refills | Status: DC | PRN

## 2019-03-12 MED ORDER — METHOCARBAMOL 500 MG PO TABS
500.0000 mg | ORAL_TABLET | Freq: Once | ORAL | Status: AC
  Administered 2019-03-12: 500 mg via ORAL
  Filled 2019-03-12: qty 1

## 2019-03-12 MED ORDER — ONDANSETRON HCL 4 MG PO TABS
4.0000 mg | ORAL_TABLET | Freq: Once | ORAL | Status: AC
  Administered 2019-03-12: 4 mg via ORAL
  Filled 2019-03-12: qty 1

## 2019-03-12 MED ORDER — TRAMADOL HCL 50 MG PO TABS
100.0000 mg | ORAL_TABLET | Freq: Once | ORAL | Status: AC
  Administered 2019-03-12: 100 mg via ORAL
  Filled 2019-03-12: qty 2

## 2019-03-12 NOTE — ED Provider Notes (Signed)
[facility] EMERGENCY DEPARTMENT Provider Note   CSN: [id number] Arrival date & time: 03/12/19  1328     History   Chief Complaint Chief Complaint  Patient presents with  . Back Pain    HPI [name] is a 85 y.o. male.     Patient is an 85 year old male who presents to the emergency department with a complaint of lower back pain.  The patient states that he was lifting the tongue of the trailer when he felt a pop in his back.  He had to sit down for a while before he could get himself together to get up.  He went home and rested his back.  He said he did not do much getting up over the next day.  Today the pain seemed to be worse and he was able to get someone to drive him here and so he came to the emergency department for evaluation.  There is been no loss of bowel or bladder function.  There has been no changes in lower extremity function.  The pain is worse with change of position and with certain turning or twisting.  No previous operations or procedures involving the back.  The patient denies any recent cancers.  There is been no IV drug use reported.  Certain movements make the pain worse.  Nothing makes the pain better.  The history is provided by the patient.  Back Pain Associated symptoms: no abdominal pain, no chest pain, no dysuria, no numbness and no weakness     Past Medical History:  Diagnosis Date  . Chronic kidney insufficiency     Patient Active Problem List   Diagnosis Date Noted  . COPD with acute exacerbation ([place]) 05/20/2018  . COPD exacerbation ([place]) 05/19/2018  . Pulmonary nodule 05/19/2018  . Bronchiectasis ([place]) 05/19/2018  . Anemia 09/17/2017  . Abnormal CT of the abdomen 11/02/2016  . Loss of weight 11/02/2016  . History of colonic polyps 11/02/2016  . GERD (gastroesophageal reflux disease) 10/01/2015    Past Surgical History:  Procedure Laterality Date  . CATARACT EXTRACTION W/PHACO Right 03/09/2016   Procedure: CATARACT  EXTRACTION PHACO AND INTRAOCULAR LENS PLACEMENT RIGHT EYE CDE=8.58;  Surgeon: Tonny Branch, MD;  Location: [facility];  Service: Ophthalmology;  Laterality: Right;  right  . CATARACT EXTRACTION W/PHACO Left 04/13/2016   Procedure: CATARACT EXTRACTION PHACO AND INTRAOCULAR LENS PLACEMENT (IOC);  Surgeon: Tonny Branch, MD;  Location: [facility];  Service: Ophthalmology;  Laterality: Left;  CDE: 8.08  . None to Date  10/01/15        Home Medications    Prior to Admission medications   Medication Sig Start Date End Date Taking? Authorizing Provider  albuterol (PROVENTIL HFA;VENTOLIN HFA) 108 (90 Base) MCG/ACT inhaler Inhale 2 puffs into the lungs every 4 (four) hours as needed for wheezing or shortness of breath. 05/21/18   Johnson, Clanford L, MD  dextromethorphan (DELSYM) 30 MG/5ML liquid Take 5 mLs (30 mg total) by mouth as needed for cough. 05/21/18   Murlean Iba, MD  Multiple Vitamin (MULTIVITAMIN WITH MINERALS) TABS tablet Take 1 tablet by mouth daily. 05/22/18   Johnson, Clanford L, MD  predniSONE (DELTASONE) 5 MG tablet Take 5 tablets daily with breakfast for 5 days, then resume 1 tablet daily 05/22/18   Johnson, Clanford L, MD  raNITIdine HCl (ZANTAC PO) Take 1 tablet by mouth daily as needed (for heartburn).    [provider]    Family History Family History  Problem  Relation Age of Onset  . Kidney disease Mother   . Kidney disease Sister   . Colon cancer Neg Hx   . Gastric cancer Neg Hx   . Esophageal cancer Neg Hx     Social History Social History   Tobacco Use  . Smoking status: Former Smoker    Years: 15.00    Types: Cigarettes, Cigars    Quit date: 10/01/1990    Years since quitting: 28.4  . Smokeless tobacco: Never Used  . Tobacco comment: Quit x 25-30 years; 2 cigars daily when smoked  Substance Use Topics  . Alcohol use: Yes    Alcohol/week: 0.0 standard drinks    Comment: Glass of wine 1-2 times a week-"sometimes"  . Drug use: No     Allergies   Patient  has no known allergies.   Review of Systems Review of Systems  Constitutional: Negative for activity change and appetite change.  HENT: Negative for congestion, ear discharge, ear pain, facial swelling, nosebleeds, rhinorrhea, sneezing and tinnitus.   Eyes: Negative for photophobia, pain and discharge.  Respiratory: Negative for cough, choking, shortness of breath and wheezing.   Cardiovascular: Negative for chest pain, palpitations and leg swelling.  Gastrointestinal: Negative for abdominal pain, blood in stool, constipation, diarrhea, nausea and vomiting.  Genitourinary: Negative for difficulty urinating, dysuria, flank pain, frequency and hematuria.  Musculoskeletal: Positive for back pain. Negative for gait problem, myalgias and neck pain.  Skin: Negative for color change, rash and wound.  Neurological: Negative for dizziness, seizures, syncope, facial asymmetry, speech difficulty, weakness and numbness.  Hematological: Negative for adenopathy. Does not bruise/bleed easily.  Psychiatric/Behavioral: Negative for agitation, confusion, hallucinations, self-injury and suicidal ideas. The patient is not nervous/anxious.      Physical Exam Updated Vital Signs BP 110/70 (BP Location: Right Arm)   Pulse 63   Temp (!) 97.5 F (36.4 C) (Oral)   Resp 16   Ht 6\' 4"  (1.93 m)   Wt 63.5 kg   SpO2 100%   BMI 17.04 kg/m   Physical Exam Vitals signs and nursing note reviewed.  Constitutional:      Appearance: He is well-developed. He is not toxic-appearing.  HENT:     Head: Normocephalic.     Right Ear: Tympanic membrane and external ear normal.     Left Ear: Tympanic membrane and external ear normal.  Eyes:     General: Lids are normal.     Pupils: Pupils are equal, round, and reactive to light.  Neck:     Musculoskeletal: Normal range of motion and neck supple.     Vascular: No carotid bruit.  Cardiovascular:     Rate and Rhythm: Normal rate and regular rhythm.     Pulses: Normal  pulses.     Heart sounds: Normal heart sounds.  Pulmonary:     Effort: No respiratory distress.     Breath sounds: Normal breath sounds.  Abdominal:     General: Bowel sounds are normal.     Palpations: Abdomen is soft. There is no mass.     Tenderness: There is no abdominal tenderness. There is no guarding.     Comments: No mass or pulsating mass noted of the abdomen.  Musculoskeletal: Normal range of motion.     Comments: There is pain of the lower lumbar area with change of position with attempted flexion extension of the lumbar spine area.  There is no pain to palpation of the cervical, thoracic, or lumbar spine.  There is  no palpable step-off.  No hot areas appreciated. Distal pulses   are symmetrical.  No temperature changes of the upper or lower extremities.  Lymphadenopathy:     Head:     Right side of head: No submandibular adenopathy.     Left side of head: No submandibular adenopathy.     Cervical: No cervical adenopathy.  Skin:    General: Skin is warm and dry.     Capillary Refill: Capillary refill takes less than 2 seconds.  Neurological:     General: No focal deficit present.     Mental Status: He is alert and oriented to person, place, and time.     Cranial Nerves: No cranial nerve deficit.     Sensory: No sensory deficit.     Motor: No weakness.     Coordination: Coordination normal.     Comments: There are no sensory deficits of the upper or lower extremities.  No motor deficits appreciated.  Psychiatric:        Mood and Affect: Mood normal.        Speech: Speech normal.      ED Treatments / Results  Labs (all labs ordered are listed, but only abnormal results are displayed) Labs Reviewed - No data to display  EKG None  Radiology No results found.  Procedures Procedures (including critical care time)  Medications Ordered in ED Medications  methocarbamol (ROBAXIN) tablet 500 mg (500 mg Oral Given 03/12/19 1447)  traMADol (ULTRAM) tablet 100 mg  (100 mg Oral Given 03/12/19 1448)  ondansetron (ZOFRAN) tablet 4 mg (4 mg Oral Given 03/12/19 1448)     Initial Impression / Assessment and Plan / ED Course  I have reviewed the triage vital signs and the nursing notes.  Pertinent labs & imaging results that were available during my care of the patient were reviewed by me and considered in my medical decision making (see chart for details).         Final Clinical Impressions(s) / ED Diagnoses MDM  Vital signs reviewed.  Pulse oximetry is 100% on room air.  Within normal limits by my interpretation.  Patient was lifting the tongue over trailer and felt a pop in his lower back.  He has had pain that has been increasing since that time.  The pain is aggravated by certain movement.  There is no mass or pulsating mass appreciated of the abdomen.  There is no changes in the temperature of the extremities.  Peripheral pulses are symmetrical.  This issues been going on over the last 48+ hours.  Doubt aneurysmal problem. Pain can be reproduced by range of motion attempts involving the lower back.  No pain however to direct palpation of the lumbar spine.  Doubt compression fracture. Patient denies use of IV drugs.  Is been no recent cancer diagnosis. Case discussed with Dr Sabra Heck.  Examination favors muscle strain.  Patient will be treated with Robaxin and Ultram.  I have advised the patient that both of these medications can cause drowsiness and to use caution getting around.  I have given him instructions not to drive or operate machinery or handle legal documents or participate in activities requiring concentration while taking either these medications.  The patient is to follow-up with his primary physician. I have given the patient the opportunity to have imaging done at this time if he would desire.  The patient states he would like to take the medications and rest.  He will see Dr. Armandina Gemma in the  office and arrange additional imaging if not  improving.   Final diagnoses:  Muscle spasm of back    ED Discharge Orders         Ordered    methocarbamol (ROBAXIN) 500 MG tablet  3 times daily     03/12/19 1512    traMADol (ULTRAM) 50 MG tablet  Every 6 hours PRN     03/12/19 1513           Lily Kocher, PA-C 03/12/19 1808    Noemi Chapel, MD 03/15/19 1007

## 2019-03-12 NOTE — Discharge Instructions (Addendum)
Your vital signs are within normal limits.  Your oxygen level is 100% which is within normal limits.  Your examination favors a muscle strain involving your lower back.  There was no pain to palpation along your spine.  If you should develop pain along her spine or this problem worsens, please see Dr. Armandina Gemma, or return to the emergency department for additional evaluation.  Please use a heating pad to the area while at rest.  Please use Tylenol extra strength and Robaxin 3 times daily with food.  May use Ultram at bedtime or every 6 hours for more severe pain.  Ultram and Robaxin may cause drowsiness, and/or lightheadedness.  Please do not drive a vehicle, operate machinery, or participate in activities requiring concentration when taking either these medications.

## 2019-03-12 NOTE — ED Triage Notes (Signed)
Pt c/o lower back pain that worsens with movement since Friday after he was "moving equipment." Pt states that he heard a pop. Denies any GI/GU symptoms or numbness/tingling in lower extremties.

## 2019-03-14 ENCOUNTER — Other Ambulatory Visit: Payer: Self-pay

## 2019-03-15 ENCOUNTER — Emergency Department (HOSPITAL_COMMUNITY): Payer: Medicare Other

## 2019-03-15 ENCOUNTER — Emergency Department (HOSPITAL_COMMUNITY)
Admission: EM | Admit: 2019-03-15 | Discharge: 2019-03-15 | Disposition: A | Discharge status: Home / Self Care | Payer: Medicare Other | Attending: Emergency Medicine | Admitting: Emergency Medicine

## 2019-03-15 ENCOUNTER — Encounter (HOSPITAL_COMMUNITY): Payer: Self-pay | Admitting: Emergency Medicine

## 2019-03-15 ENCOUNTER — Other Ambulatory Visit: Payer: Self-pay

## 2019-03-15 DIAGNOSIS — S3982XD Other specified injuries of lower back, subsequent encounter: Secondary | ICD-10-CM | POA: Insufficient documentation

## 2019-03-15 DIAGNOSIS — Z87891 Personal history of nicotine dependence: Secondary | ICD-10-CM | POA: Diagnosis not present

## 2019-03-15 DIAGNOSIS — J449 Chronic obstructive pulmonary disease, unspecified: Secondary | ICD-10-CM | POA: Insufficient documentation

## 2019-03-15 DIAGNOSIS — S3992XD Unspecified injury of lower back, subsequent encounter: Secondary | ICD-10-CM

## 2019-03-15 DIAGNOSIS — M545 Low back pain: Secondary | ICD-10-CM | POA: Diagnosis not present

## 2019-03-15 DIAGNOSIS — X501XXD Overexertion from prolonged static or awkward postures, subsequent encounter: Secondary | ICD-10-CM | POA: Diagnosis not present

## 2019-03-15 DIAGNOSIS — S3992XA Unspecified injury of lower back, initial encounter: Secondary | ICD-10-CM | POA: Diagnosis not present

## 2019-03-15 MED ORDER — HYDROCODONE-ACETAMINOPHEN 5-325 MG PO TABS
1.0000 | ORAL_TABLET | Freq: Once | ORAL | Status: AC
  Administered 2019-03-15: 1 via ORAL
  Filled 2019-03-15: qty 1

## 2019-03-15 MED ORDER — HYDROCODONE-ACETAMINOPHEN 5-325 MG PO TABS: 1.0000 | ORAL_TABLET | ORAL | 0 refills | Status: DC | PRN

## 2019-03-15 NOTE — ED Triage Notes (Signed)
Patient states he was seen here on Sunday for low back pain, given medication. Patient reports he has had no relief and the pain is getting worse. Denies problems with bowel or bladder.

## 2019-03-15 NOTE — Discharge Instructions (Signed)
The CT scan did not show any new fractures or other complications.  Most likely this pain is muscular in origin and will improve with time.  To improve healing, make sure you are resting and using heat on the sore area 3 or 4 times a day.  We are changing you to a new narcotic pain reliever, hydrocodone, to help your pain.  Do not take the tramadol when you are using the hydrocodone.  Continue using the muscle relaxer as directed.  Your doctor may be able to help you if you do not improve in a week, by ordering physical therapy.

## 2019-03-15 NOTE — ED Provider Notes (Signed)
[facility] EMERGENCY DEPARTMENT Provider Note   CSN: [id number] Arrival date & time: 03/15/19  1257     History   Chief Complaint No chief complaint on file.   HPI [name] is a 85 y.o. male.     HPI   He presents for evaluation of persistent back pain, onset about a week ago when he bent over to pick up a trailer tongue.  He felt a pop in his back and then had immediate pain which is persisted in the lower back in the belt line area.  The pain does not radiate.  The pain is persistent despite being treated for it with tramadol and Robaxin, following a recent ED evaluation on 03/12/2019.  He denies bowel or bladder incontinence.  He denies fever, chills, nausea, vomiting, focal weakness or paresthesia.  There are no other known modifying factors.  Past Medical History:  Diagnosis Date   Chronic kidney insufficiency     Patient Active Problem List   Diagnosis Date Noted   COPD with acute exacerbation ([place]) 05/20/2018   COPD exacerbation ([place]) 05/19/2018   Pulmonary nodule 05/19/2018   Bronchiectasis ([place]) 05/19/2018   Anemia 09/17/2017   Abnormal CT of the abdomen 11/02/2016   Loss of weight 11/02/2016   History of colonic polyps 11/02/2016   GERD (gastroesophageal reflux disease) 10/01/2015    Past Surgical History:  Procedure Laterality Date   CATARACT EXTRACTION W/PHACO Right 03/09/2016   Procedure: CATARACT EXTRACTION PHACO AND INTRAOCULAR LENS PLACEMENT RIGHT EYE CDE=8.58;  Surgeon: Tonny Branch, MD;  Location: [facility];  Service: Ophthalmology;  Laterality: Right;  right   CATARACT EXTRACTION W/PHACO Left 04/13/2016   Procedure: CATARACT EXTRACTION PHACO AND INTRAOCULAR LENS PLACEMENT (IOC);  Surgeon: Tonny Branch, MD;  Location: [facility];  Service: Ophthalmology;  Laterality: Left;  CDE: 8.08   None to Date  10/01/15        Home Medications    Prior to Admission medications   Medication Sig Start Date End Date Taking? Authorizing Provider    albuterol (PROVENTIL HFA;VENTOLIN HFA) 108 (90 Base) MCG/ACT inhaler Inhale 2 puffs into the lungs every 4 (four) hours as needed for wheezing or shortness of breath. 05/21/18   Johnson, Clanford L, MD  dextromethorphan (DELSYM) 30 MG/5ML liquid Take 5 mLs (30 mg total) by mouth as needed for cough. 05/21/18   Johnson, Clanford L, MD  HYDROcodone-acetaminophen (NORCO) 5-325 MG tablet Take 1 tablet by mouth every 4 (four) hours as needed. 03/15/19   Daleen Bo, MD  methocarbamol (ROBAXIN) 500 MG tablet Take 1 tablet (500 mg total) by mouth 3 (three) times daily. 03/12/19   Lily Kocher, PA-C  Multiple Vitamin (MULTIVITAMIN WITH MINERALS) TABS tablet Take 1 tablet by mouth daily. 05/22/18   Johnson, Clanford L, MD  predniSONE (DELTASONE) 5 MG tablet Take 5 tablets daily with breakfast for 5 days, then resume 1 tablet daily 05/22/18   Johnson, Clanford L, MD  raNITIdine HCl (ZANTAC PO) Take 1 tablet by mouth daily as needed (for heartburn).    [provider]    Family History Family History  Problem Relation Age of Onset   Kidney disease Mother    Kidney disease Sister    Colon cancer Neg Hx    Gastric cancer Neg Hx    Esophageal cancer Neg Hx     Social History Social History   Tobacco Use   Smoking status: Former Smoker    Years: 15.00    Types: Cigarettes, Cigars  Quit date: 10/01/1990    Years since quitting: 28.4   Smokeless tobacco: Never Used   Tobacco comment: Quit x 25-30 years; 2 cigars daily when smoked  Substance Use Topics   Alcohol use: Yes    Alcohol/week: 0.0 standard drinks    Comment: Glass of wine 1-2 times a week-"sometimes"   Drug use: No     Allergies   Patient has no known allergies.   Review of Systems Review of Systems  All other systems reviewed and are negative.    Physical Exam Updated Vital Signs BP 132/73 (BP Location: Right Arm)    Pulse 66    Temp 97.8 F (36.6 C) (Oral)    Resp 20    Ht 6\' 4"  (1.93 m)    Wt 63.5 kg     BMI 17.04 kg/m   Physical Exam Vitals signs and nursing note reviewed.  Constitutional:      Appearance: He is well-developed.  HENT:     Head: Normocephalic and atraumatic.     Right Ear: External ear normal.     Left Ear: External ear normal.  Eyes:     Conjunctiva/sclera: Conjunctivae normal.     Pupils: Pupils are equal, round, and reactive to light.  Neck:     Musculoskeletal: Normal range of motion and neck supple.     Trachea: Phonation normal.  Cardiovascular:     Rate and Rhythm: Normal rate.  Pulmonary:     Effort: Pulmonary effort is normal.  Musculoskeletal:     Comments: Mild tenderness lower back lumbar region, without deformity or step-off.  Fair range of motion lower back with mild pain on flexion.  Negative straight leg raising bilaterally.  Skin:    General: Skin is warm and dry.  Neurological:     Mental Status: He is alert and oriented to person, place, and time.     Cranial Nerves: No cranial nerve deficit.     Sensory: No sensory deficit.     Motor: No abnormal muscle tone.     Coordination: Coordination normal.  Psychiatric:        Mood and Affect: Mood normal.        Behavior: Behavior normal.        Thought Content: Thought content normal.        Judgment: Judgment normal.      ED Treatments / Results  Labs (all labs ordered are listed, but only abnormal results are displayed) Labs Reviewed - No data to display  EKG None  Radiology Ct Lumbar Spine Wo Contrast  Result Date: 03/15/2019 CLINICAL DATA:  Low back pain since a lifting injury last week. Initial encounter. EXAM: CT LUMBAR SPINE WITHOUT CONTRAST TECHNIQUE: Multidetector CT imaging of the lumbar spine was performed without intravenous contrast administration. Multiplanar CT image reconstructions were also generated. COMPARISON:  Plain films lumbar spine 09/16/2017. FINDINGS: Segmentation: Standard. Alignment: Maintained with straightening of lordosis noted. Vertebrae: No acute  fracture or focal pathologic process. The patient has remote compression fractures of L2, L3, L4 and L5 which are present on the prior exam. No acute fracture is identified. Bones are osteopenic. Paraspinal and other soft tissues: There is some basilar atelectasis and scar. Emphysema noted. Atherosclerotic vascular disease is seen. No acute abnormality. Disc levels: T11-12: Negative. T12-L1: Negative. L1-2: There is some facet degenerative disease and a minimal disc bulge. No stenosis. L2-3: Mild facet degenerative change and slight bony retropulsion off the superior endplate of L3. No stenosis. L3-4: Shallow disc  bulge, mild ligamentum flavum thickening and facet arthropathy. There is mild central canal narrowing. Neural foramina are open. L4-5: Minimal disc bulge and mild facet degenerative change. No stenosis. L5-S1: Loss of disc space height with vacuum disc phenomenon and a shallow bulge. The central canal and right foramen are widely patent. Mild left foraminal narrowing noted. IMPRESSION: No acute abnormality. Remote L2, L3, L4 and L5 compression fractures. Osteopenia. Overall mild lumbar spondylosis as described above. Atherosclerosis. Emphysema and basilar scarring. Electronically Signed   By: Inge Rise M.D.   On: 03/15/2019 15:01    Procedures Procedures (including critical care time)  Medications Ordered in ED Medications  HYDROcodone-acetaminophen (NORCO/VICODIN) 5-325 MG per tablet 1 tablet (has no administration in time range)     Initial Impression / Assessment and Plan / ED Course  I have reviewed the triage vital signs and the nursing notes.  Pertinent labs & imaging results that were available during my care of the patient were reviewed by me and considered in my medical decision making (see chart for details).  Clinical Course as of Mar 14 1520  Wed Mar 15, 2019  1353 Suspected compression fracture, CT imaging ordered.   [EW]  4540 No acute fracture or dislocation,  interpreted by me  CT Lumbar Spine Wo Contrast [EW]    Clinical Course User Index [EW] Daleen Bo, MD        Patient Vitals for the past 24 hrs:  BP Temp Temp src Pulse Resp Height Weight  03/15/19 1307 -- -- -- -- -- 6\' 4"  (1.93 m) 63.5 kg  03/15/19 1306 132/73 97.8 F (36.6 C) Oral 66 20 -- --    3:21 PM Reevaluation with update and discussion. After initial assessment and treatment, an updated evaluation reveals no change in clinical status, findings discussed with the patient and all questions were answered. Daleen Bo   Medical Decision Making: Back pain after lifting incident, without sign for new neurologic abnormalities, or spine abnormalities on imaging.  No indication for hospitalization at this time.  CRITICAL CARE-no Performed by: Daleen Bo  Nursing Notes Reviewed/ Care Coordinated Applicable Imaging Reviewed Interpretation of Laboratory Data incorporated into ED treatment  The patient appears reasonably screened and/or stabilized for discharge and I doubt any other medical condition or other [facility] requiring further screening, evaluation, or treatment in the ED at this time prior to discharge.  Plan: Home Medications-continue usual, hold tramadol for now; Home Treatments-gradual advance activity; return here if the recommended treatment, does not improve the symptoms; Recommended follow up-PCP checkup 1 week if not better, and consider referral to physical therapy at that time   Final Clinical Impressions(s) / ED Diagnoses   Final diagnoses:  Injury of low back, subsequent encounter    ED Discharge Orders         Ordered    HYDROcodone-acetaminophen (NORCO) 5-325 MG tablet  Every 4 hours PRN     03/15/19 1519           Daleen Bo, MD 03/15/19 1521

## 2019-03-20 DIAGNOSIS — J449 Chronic obstructive pulmonary disease, unspecified: Secondary | ICD-10-CM | POA: Diagnosis not present

## 2019-03-20 DIAGNOSIS — Z681 Body mass index (BMI) 19 or less, adult: Secondary | ICD-10-CM | POA: Diagnosis not present

## 2019-03-20 DIAGNOSIS — M8580 Other specified disorders of bone density and structure, unspecified site: Secondary | ICD-10-CM | POA: Diagnosis not present

## 2019-03-20 DIAGNOSIS — M545 Low back pain: Secondary | ICD-10-CM | POA: Diagnosis not present

## 2019-03-23 DIAGNOSIS — M545 Low back pain, unspecified: Secondary | ICD-10-CM | POA: Diagnosis present

## 2019-03-27 DIAGNOSIS — M545 Low back pain: Secondary | ICD-10-CM | POA: Diagnosis not present

## 2019-03-29 ENCOUNTER — Other Ambulatory Visit: Payer: Self-pay | Admitting: Physician Assistant

## 2019-03-29 DIAGNOSIS — M545 Low back pain, unspecified: Secondary | ICD-10-CM

## 2019-04-07 DIAGNOSIS — Z681 Body mass index (BMI) 19 or less, adult: Secondary | ICD-10-CM | POA: Diagnosis not present

## 2019-04-07 DIAGNOSIS — E782 Mixed hyperlipidemia: Secondary | ICD-10-CM | POA: Diagnosis not present

## 2019-04-07 DIAGNOSIS — Z0000 Encounter for general adult medical examination without abnormal findings: Secondary | ICD-10-CM | POA: Diagnosis not present

## 2019-04-07 DIAGNOSIS — E441 Mild protein-calorie malnutrition: Secondary | ICD-10-CM | POA: Diagnosis not present

## 2019-04-07 DIAGNOSIS — J449 Chronic obstructive pulmonary disease, unspecified: Secondary | ICD-10-CM | POA: Diagnosis not present

## 2019-04-07 DIAGNOSIS — M5136 Other intervertebral disc degeneration, lumbar region: Secondary | ICD-10-CM | POA: Diagnosis not present

## 2019-04-07 DIAGNOSIS — E039 Hypothyroidism, unspecified: Secondary | ICD-10-CM | POA: Diagnosis not present

## 2019-04-07 DIAGNOSIS — D508 Other iron deficiency anemias: Secondary | ICD-10-CM | POA: Diagnosis not present

## 2019-04-11 ENCOUNTER — Other Ambulatory Visit: Payer: Self-pay | Admitting: *Deleted

## 2019-04-11 NOTE — Patient Outreach (Signed)
[name]) Care Management  [place]  04/11/2019   [name] [facility] 12-30-1929 [phone]  RN Health Coach telephone call to patient.  [employer] compliance verified. Per patient he is doing good. Patient stated that he is not having any coughing or symptoms. Patient is taking medications as per ordered. He stated his appetite was fair. He is still driving and wears mask when he goes out. Patient stated he has not had any recent falls.Patient has agreed to follow up outreach calls  Encounter Medications:  Outpatient Encounter Medications as of 04/11/2019  Medication Sig  . albuterol (PROVENTIL HFA;VENTOLIN HFA) 108 (90 Base) MCG/ACT inhaler Inhale 2 puffs into the lungs every 4 (four) hours as needed for wheezing or shortness of breath.  Marland Kitchen HYDROcodone-acetaminophen (NORCO) 5-325 MG tablet Take 1 tablet by mouth every 4 (four) hours as needed.  . Multiple Vitamin (MULTIVITAMIN WITH MINERALS) TABS tablet Take 1 tablet by mouth daily.  . predniSONE (DELTASONE) 5 MG tablet Take 5 tablets daily with breakfast for 5 days, then resume 1 tablet daily  . dextromethorphan (DELSYM) 30 MG/5ML liquid Take 5 mLs (30 mg total) by mouth as needed for cough. (Patient not taking: Reported on 04/11/2019)  . methocarbamol (ROBAXIN) 500 MG tablet Take 1 tablet (500 mg total) by mouth 3 (three) times daily. (Patient not taking: Reported on 04/11/2019)  . raNITIdine HCl (ZANTAC PO) Take 1 tablet by mouth daily as needed (for heartburn).   No facility-administered encounter medications on file as of 04/11/2019.     Functional Status:  In your present state of health, do you have any difficulty performing the following activities: 04/11/2019 05/21/2018  Hearing? N N  Vision? N N  Difficulty concentrating or making decisions? N N  Walking or climbing stairs? N N  Dressing or bathing? N Y  Doing errands, shopping? N N  Using the Toilet? N -  In the past six months, have you accidently  leaked urine? N -  Do you have problems with loss of bowel control? N -  Managing your Medications? N -  Managing your Finances? N -  Housekeeping or managing your Housekeeping? N -  Some recent data might be hidden    Fall/Depression Screening: Fall Risk  04/11/2019 12/16/2018  Falls in the past year? 0 (No Data)  Comment - [place] Telephone Survey: data to providers prior to load  Number falls in past yr: 0 (No Data)  Comment - [facility] Telephone Survey Actual Response =   Injury with Fall? 0 -  Risk for fall due to : Impaired balance/gait;Impaired mobility -  Follow up Falls evaluation completed;Falls prevention discussed;Education provided -   PHQ 2/9 Scores 04/11/2019  PHQ - 2 Score 0   THN CM Care Plan Problem One     Most Recent Value  Care Plan Problem One  Knowledge Deficit in Self Management of COPD  Role Documenting the Problem One  [place] for Problem One  Active  THN Long Term Goal   Patient will not have a COPD exacebation within the next 90 days  THN Long Term Goal Start Date  04/11/19  Interventions for Problem One Long Term Goal  RN discussed COPD exacerbation. RN sent educational material on COPD exacerbation. RN will follow up with further discussion  THN CM Short Term Goal #1   Patient will verbalize receiving information on COPD and eating plan within the next 30 days  THN CM Short Term Goal #1 Start  Date  04/11/19  Interventions for Short Term Goal #1  RN discussed patien eating habits. RN sent educational material on Eating paln for COPD. RN will follow up with further discussion  THN CM Short Term Goal #2   Patient will verbalize receiving educational material on COPD and physical activity within the next 30 days  THN CM Short Term Goal #2 Start Date  04/11/19  Interventions for Short Term Goal #2  Rn discussed the patient physical activity and back pain. RN sent educational material on COPD and physical activity. RN will follow up with further  discussion      Assessment:  Patient is using medications as prescribed Patient is having back pain Patient will benefit from [place] telephonic outreach for education and support for diabetes self management.  Plan:  RN discussed physical activity RN sent  educational material on COPD and physical activity RN sent educational material on eating plan for COPD RN sent a COPD packet RN sent barriers letter and assessment to PCP RN will follow up outreach within the month of February  [name] Care Management [phone]

## 2019-04-16 ENCOUNTER — Other Ambulatory Visit: Payer: Self-pay

## 2019-04-16 ENCOUNTER — Ambulatory Visit
Admission: RE | Admit: 2019-04-16 | Discharge: 2019-04-16 | Disposition: A | Discharge status: Home / Self Care | Payer: Medicare Other | Source: Ambulatory Visit | Attending: Physician Assistant | Admitting: Physician Assistant

## 2019-04-16 DIAGNOSIS — M545 Low back pain, unspecified: Secondary | ICD-10-CM

## 2019-04-16 DIAGNOSIS — M48061 Spinal stenosis, lumbar region without neurogenic claudication: Secondary | ICD-10-CM | POA: Diagnosis not present

## 2019-04-20 DIAGNOSIS — R030 Elevated blood-pressure reading, without diagnosis of hypertension: Secondary | ICD-10-CM | POA: Insufficient documentation

## 2019-04-20 DIAGNOSIS — S32000A Wedge compression fracture of unspecified lumbar vertebra, initial encounter for closed fracture: Secondary | ICD-10-CM | POA: Insufficient documentation

## 2019-04-20 DIAGNOSIS — Z681 Body mass index (BMI) 19 or less, adult: Secondary | ICD-10-CM | POA: Insufficient documentation

## 2019-04-20 DIAGNOSIS — S32020G Wedge compression fracture of second lumbar vertebra, subsequent encounter for fracture with delayed healing: Secondary | ICD-10-CM | POA: Diagnosis not present

## 2019-05-02 DIAGNOSIS — U071 COVID-19: Secondary | ICD-10-CM | POA: Diagnosis not present

## 2019-05-05 DIAGNOSIS — Y929 Unspecified place or not applicable: Secondary | ICD-10-CM | POA: Diagnosis not present

## 2019-05-05 DIAGNOSIS — X58XXXA Exposure to other specified factors, initial encounter: Secondary | ICD-10-CM | POA: Diagnosis not present

## 2019-05-05 DIAGNOSIS — M545 Low back pain: Secondary | ICD-10-CM | POA: Diagnosis not present

## 2019-05-05 DIAGNOSIS — Y939 Activity, unspecified: Secondary | ICD-10-CM | POA: Diagnosis not present

## 2019-05-05 DIAGNOSIS — S32020G Wedge compression fracture of second lumbar vertebra, subsequent encounter for fracture with delayed healing: Secondary | ICD-10-CM | POA: Diagnosis not present

## 2019-05-05 DIAGNOSIS — Y999 Unspecified external cause status: Secondary | ICD-10-CM | POA: Diagnosis not present

## 2019-05-22 DIAGNOSIS — R911 Solitary pulmonary nodule: Secondary | ICD-10-CM | POA: Diagnosis not present

## 2019-05-22 DIAGNOSIS — M810 Age-related osteoporosis without current pathological fracture: Secondary | ICD-10-CM | POA: Diagnosis not present

## 2019-05-22 DIAGNOSIS — K219 Gastro-esophageal reflux disease without esophagitis: Secondary | ICD-10-CM | POA: Diagnosis not present

## 2019-05-22 DIAGNOSIS — R627 Adult failure to thrive: Secondary | ICD-10-CM | POA: Diagnosis not present

## 2019-05-22 DIAGNOSIS — D469 Myelodysplastic syndrome, unspecified: Secondary | ICD-10-CM | POA: Diagnosis not present

## 2019-05-31 ENCOUNTER — Emergency Department (HOSPITAL_COMMUNITY)
Admission: EM | Admit: 2019-05-31 | Discharge: 2019-05-31 | Disposition: A | Discharge status: Home / Self Care | Payer: Medicare Other | Attending: Emergency Medicine | Admitting: Emergency Medicine

## 2019-05-31 ENCOUNTER — Other Ambulatory Visit: Payer: Self-pay

## 2019-05-31 ENCOUNTER — Emergency Department (HOSPITAL_COMMUNITY): Payer: Medicare Other

## 2019-05-31 ENCOUNTER — Encounter (HOSPITAL_COMMUNITY): Payer: Self-pay | Admitting: Emergency Medicine

## 2019-05-31 DIAGNOSIS — M545 Low back pain: Secondary | ICD-10-CM | POA: Diagnosis not present

## 2019-05-31 DIAGNOSIS — Z79899 Other long term (current) drug therapy: Secondary | ICD-10-CM | POA: Diagnosis not present

## 2019-05-31 DIAGNOSIS — X500XXD Overexertion from strenuous movement or load, subsequent encounter: Secondary | ICD-10-CM | POA: Insufficient documentation

## 2019-05-31 DIAGNOSIS — Z87891 Personal history of nicotine dependence: Secondary | ICD-10-CM | POA: Diagnosis not present

## 2019-05-31 DIAGNOSIS — J449 Chronic obstructive pulmonary disease, unspecified: Secondary | ICD-10-CM | POA: Insufficient documentation

## 2019-05-31 DIAGNOSIS — S32040D Wedge compression fracture of fourth lumbar vertebra, subsequent encounter for fracture with routine healing: Secondary | ICD-10-CM | POA: Insufficient documentation

## 2019-05-31 DIAGNOSIS — S32040A Wedge compression fracture of fourth lumbar vertebra, initial encounter for closed fracture: Secondary | ICD-10-CM

## 2019-05-31 DIAGNOSIS — M546 Pain in thoracic spine: Secondary | ICD-10-CM | POA: Diagnosis not present

## 2019-05-31 DIAGNOSIS — M4856XA Collapsed vertebra, not elsewhere classified, lumbar region, initial encounter for fracture: Secondary | ICD-10-CM | POA: Diagnosis not present

## 2019-05-31 MED ORDER — ONDANSETRON 4 MG PO TBDP
4.0000 mg | ORAL_TABLET | Freq: Once | ORAL | Status: AC
  Administered 2019-05-31: 4 mg via ORAL
  Filled 2019-05-31: qty 1

## 2019-05-31 MED ORDER — HYDROMORPHONE HCL 1 MG/ML IJ SOLN
0.5000 mg | Freq: Once | INTRAMUSCULAR | Status: AC
  Administered 2019-05-31: 19:00:00 0.5 mg via INTRAMUSCULAR
  Filled 2019-05-31: qty 1

## 2019-05-31 MED ORDER — OXYCODONE-ACETAMINOPHEN 5-325 MG PO TABS
1.0000 | ORAL_TABLET | Freq: Once | ORAL | Status: AC
  Administered 2019-05-31: 1 via ORAL
  Filled 2019-05-31: qty 1

## 2019-05-31 MED ORDER — DEXAMETHASONE SODIUM PHOSPHATE 10 MG/ML IJ SOLN
10.0000 mg | Freq: Once | INTRAMUSCULAR | Status: AC
  Administered 2019-05-31: 10 mg via INTRAMUSCULAR
  Filled 2019-05-31: qty 1

## 2019-05-31 MED ORDER — LIDOCAINE 5 % EX PTCH: 1.0000 | MEDICATED_PATCH | CUTANEOUS | 0 refills | Status: DC

## 2019-05-31 MED ORDER — OXYCODONE-ACETAMINOPHEN 5-325 MG PO TABS: 1.0000 | ORAL_TABLET | ORAL | 0 refills | Status: DC | PRN

## 2019-05-31 MED ORDER — LIDOCAINE 5 % EX PTCH
1.0000 | MEDICATED_PATCH | CUTANEOUS | Status: DC
  Administered 2019-05-31: 1 via TRANSDERMAL
  Filled 2019-05-31: qty 1

## 2019-05-31 NOTE — ED Provider Notes (Signed)
[name] Provider Note   CSN: [id number] Arrival date & time: 05/31/19  1400     History Chief Complaint  Patient presents with  . Back Pain    [name] is a 86 y.o. male with a history as outlined below including COPD, anemia and GERD and pertinent history of multiple thoracic and lumbar compression fractures presenting with a sudden popping sensation mid upper lumbar region when he bent over to pick up a 2x4  Around 10 am today.  He reports persistent pain at the site with any movement.  He underwent several kyphoplasty procedures (Dr. Kathyrn Sheriff) last month and was feeling relatively well until today.  He denies radiation of pain or weakness in his legs.  Also no urinary or fecal incontinence or retention.  He has taken hydrocodone without improvement in pain (this was prescribed prior to his kyphoplasty last month and states it didn't work well then either.)  The history is provided by the patient.       Past Medical History:  Diagnosis Date  . Chronic kidney insufficiency     Patient Active Problem List   Diagnosis Date Noted  . COPD with acute exacerbation ([place]) 05/20/2018  . COPD exacerbation ([place]) 05/19/2018  . Pulmonary nodule 05/19/2018  . Bronchiectasis ([place]) 05/19/2018  . Anemia 09/17/2017  . Abnormal CT of the abdomen 11/02/2016  . Loss of weight 11/02/2016  . History of colonic polyps 11/02/2016  . GERD (gastroesophageal reflux disease) 10/01/2015    Past Surgical History:  Procedure Laterality Date  . CATARACT EXTRACTION W/PHACO Right 03/09/2016   Procedure: CATARACT EXTRACTION PHACO AND INTRAOCULAR LENS PLACEMENT RIGHT EYE CDE=8.58;  Surgeon: Tonny Branch, MD;  Location: [facility];  Service: Ophthalmology;  Laterality: Right;  right  . CATARACT EXTRACTION W/PHACO Left 04/13/2016   Procedure: CATARACT EXTRACTION PHACO AND INTRAOCULAR LENS PLACEMENT (IOC);  Surgeon: Tonny Branch, MD;  Location: [facility];  Service: Ophthalmology;   Laterality: Left;  CDE: 8.08  . None to Date  10/01/15       Family History  Problem Relation Age of Onset  . Kidney disease Mother   . Kidney disease Sister   . Colon cancer Neg Hx   . Gastric cancer Neg Hx   . Esophageal cancer Neg Hx     Social History   Tobacco Use  . Smoking status: Former Smoker    Years: 15.00    Types: Cigarettes, Cigars    Quit date: 10/01/1990    Years since quitting: 28.6  . Smokeless tobacco: Never Used  . Tobacco comment: Quit x 25-30 years; 2 cigars daily when smoked  Substance Use Topics  . Alcohol use: Yes    Alcohol/week: 0.0 standard drinks    Comment: Glass of wine 1-2 times a week-"sometimes"  . Drug use: No    Home Medications Prior to Admission medications   Medication Sig Start Date End Date Taking? Authorizing Provider  albuterol (PROVENTIL HFA;VENTOLIN HFA) 108 (90 Base) MCG/ACT inhaler Inhale 2 puffs into the lungs every 4 (four) hours as needed for wheezing or shortness of breath. 05/21/18   Johnson, Clanford L, MD  dextromethorphan (DELSYM) 30 MG/5ML liquid Take 5 mLs (30 mg total) by mouth as needed for cough. Patient not taking: Reported on 04/11/2019 05/21/18   Murlean Iba, MD  HYDROcodone-acetaminophen (NORCO) 5-325 MG tablet Take 1 tablet by mouth every 4 (four) hours as needed. 03/15/19   Daleen Bo, MD  lidocaine (LIDODERM) 5 % Place 1  patch onto the skin daily. Remove & Discard patch within 12 hours or as directed by MD 05/31/19   Evalee Jefferson, PA-C  methocarbamol (ROBAXIN) 500 MG tablet Take 1 tablet (500 mg total) by mouth 3 (three) times daily. Patient not taking: Reported on 04/11/2019 03/12/19   Lily Kocher, PA-C  Multiple Vitamin (MULTIVITAMIN WITH MINERALS) TABS tablet Take 1 tablet by mouth daily. 05/22/18   Johnson, Clanford L, MD  oxyCODONE-acetaminophen (PERCOCET/ROXICET) 5-325 MG tablet Take 1 tablet by mouth every 4 (four) hours as needed. 05/31/19   Peri Kreft, Almyra Free, PA-C  predniSONE (DELTASONE) 5 MG tablet  Take 5 tablets daily with breakfast for 5 days, then resume 1 tablet daily 05/22/18   Johnson, Clanford L, MD  raNITIdine HCl (ZANTAC PO) Take 1 tablet by mouth daily as needed (for heartburn).    [provider]    Allergies    Patient has no known allergies.  Review of Systems   Review of Systems  Constitutional: Negative for fever.  Respiratory: Negative for shortness of breath.   Cardiovascular: Negative for chest pain and leg swelling.  Gastrointestinal: Negative for abdominal distention, abdominal pain and constipation.  Genitourinary: Negative for difficulty urinating, dysuria, flank pain, frequency and urgency.  Musculoskeletal: Positive for back pain. Negative for gait problem and joint swelling.  Skin: Negative for rash.  Neurological: Negative for weakness and numbness.    Physical Exam Updated Vital Signs BP (!) 107/92   Pulse 65   Temp 98.5 F (36.9 C) (Oral)   Resp (!) 24   Ht 6\' 4"  (1.93 m)   Wt 64 kg   SpO2 97%   BMI 17.16 kg/m   Physical Exam Vitals and nursing note reviewed.  Constitutional:      Appearance: He is well-developed.  HENT:     Head: Normocephalic.  Eyes:     Conjunctiva/sclera: Conjunctivae normal.  Cardiovascular:     Rate and Rhythm: Normal rate.     Comments: Pedal pulses normal. Pulmonary:     Effort: Pulmonary effort is normal.  Abdominal:     General: Bowel sounds are normal. There is no distension.     Palpations: Abdomen is soft. There is no mass.     Tenderness: There is no guarding.  Musculoskeletal:     Cervical back: Normal range of motion and neck supple.     Lumbar back: Bony tenderness present. No swelling, edema, deformity or spasms. Decreased range of motion.       Back:  Skin:    General: Skin is warm and dry.  Neurological:     Mental Status: He is alert.     Sensory: No sensory deficit.     Motor: No tremor or atrophy.     Gait: Gait normal.     Deep Tendon Reflexes:     Reflex Scores:       Patellar reflexes are 2+ on the right side and 2+ on the left side.      Achilles reflexes are 2+ on the right side and 2+ on the left side.    Comments: No strength deficit noted in hip and knee flexor and extensor muscle groups.  Ankle flexion and extension intact.     ED Results / Procedures / Treatments   Labs (all labs ordered are listed, but only abnormal results are displayed) Labs Reviewed - No data to display  EKG None  Radiology DG Thoracic Spine 2 View  Result Date: 05/31/2019 CLINICAL DATA:  Back pain after  lifting this morning. EXAM: THORACIC SPINE 2 VIEWS COMPARISON:  Thoracic MRI, 09/21/2011. Chest radiographs, 12/08/2017. FINDINGS: There are wedge-shaped compression fractures of T6, T7, T8 and T9. T8 fracture is new since the prior thoracic MRI. However, it appears stable from prior lateral chest radiograph from July 2019. No convincing acute fracture. No malalignment. Skeletal structures are diffusely demineralized. Soft tissues are unremarkable. IMPRESSION: 1. No acute fracture. 2. Chronic midthoracic spine compression fractures. Diffuse skeletal demineralization. Electronically Signed   By: Lajean Manes M.D.   On: 05/31/2019 14:46   DG Lumbar Spine Complete  Result Date: 05/31/2019 CLINICAL DATA:  Low back pain after lifting this morning. History of kyphoplasty 1 month ago. EXAM: LUMBAR SPINE - COMPLETE 4+ VIEW COMPARISON:  09/16/2017 FINDINGS: Previously seen fracture of L2 has been treated with kyphoplasty. There is a chronic fracture of L3. Is a mild to moderate compression deformity of L4 which appears increased in severity from the prior exam. Mild loss vertebral body height of L5 is stable. No spondylolisthesis. Mild loss of disc height at L5-S1. Remaining discs are well preserved in height. Skeletal structures are diffusely demineralized. There are scattered calcifications along a normal caliber abdominal aorta. IMPRESSION: 1. Increase in loss of vertebral body height  of L4 since the prior study, which could reflect an acute on chronic insufficiency fracture. 2. Other fractures are stable from prior radiographs. 3. No malalignment. Electronically Signed   By: Lajean Manes M.D.   On: 05/31/2019 14:42    Procedures Procedures (including critical care time)  Medications Ordered in ED Medications  lidocaine (LIDODERM) 5 % 1 patch (has no administration in time range)  oxyCODONE-acetaminophen (PERCOCET/ROXICET) 5-325 MG per tablet 1 tablet (1 tablet Oral Given 05/31/19 1706)  dexamethasone (DECADRON) injection 10 mg (10 mg Intramuscular Given 05/31/19 1707)  ondansetron (ZOFRAN-ODT) disintegrating tablet 4 mg (4 mg Oral Given 05/31/19 1829)  HYDROmorphone (DILAUDID) injection 0.5 mg (0.5 mg Intramuscular Given 05/31/19 1830)    ED Course  I have reviewed the triage vital signs and the nursing notes.  Pertinent labs & imaging results that were available during my care of the patient were reviewed by me and considered in my medical decision making (see chart for details).    MDM Rules/Calculators/A&P                      Pt with known severe thoracic and lumbar osteopenia/osteoporosis and subacute compression fractures.  todays repeat plain films revealing for acute on chronic compression at L4.  No radicular pain into lower extremities, no weakness or numbness.  He was given decadron injection and oxycodone tablet with pain improvement from 10/10 to 8/10.  Added dilaudid IM with pain improved, reporting 6/10 but tolerable per pt report.  He was able to ambulate in dept.  Prescribed additional oxycodone, also trial of lidoderm pain patch and script for additional if this helps.  Advised close f/u with Dr. Kathyrn Sheriff for f/u care.  The patient appears reasonably screened and/or stabilized for discharge and I doubt any other medical condition or other [facility] requiring further screening, evaluation, or treatment in the ED at this time prior to discharge.  Final  Clinical Impression(s) / ED Diagnoses Final diagnoses:  Compression fracture of L4 vertebra, initial encounter ([place])    Rx / DC Orders ED Discharge Orders         Ordered    oxyCODONE-acetaminophen (PERCOCET/ROXICET) 5-325 MG tablet  Every 4 hours PRN     05/31/19 1844  lidocaine (LIDODERM) 5 %  Every 24 hours     05/31/19 1844           Landis Martins 05/31/19 Stefani Dama, MD 06/01/19 [phone]

## 2019-05-31 NOTE — Discharge Instructions (Addendum)
You may take the pain medicine prescribed, although use caution - this can make you drowsy.  Do not drive within 4 hours of taking this medicine as it will make you drowsy.  You may also try the pain patch which also offer some relief.

## 2019-05-31 NOTE — ED Triage Notes (Signed)
Patient reports back pain that started this am around 10 am after picking up a 2x4. Patient states he felt "a pop."

## 2019-06-02 ENCOUNTER — Encounter (HOSPITAL_COMMUNITY): Payer: Self-pay | Admitting: Emergency Medicine

## 2019-06-02 ENCOUNTER — Emergency Department (HOSPITAL_COMMUNITY)
Admission: EM | Admit: 2019-06-02 | Discharge: 2019-06-02 | Disposition: A | Discharge status: Home / Self Care | Payer: Medicare Other | Attending: Emergency Medicine | Admitting: Emergency Medicine

## 2019-06-02 ENCOUNTER — Emergency Department (HOSPITAL_COMMUNITY): Payer: Medicare Other

## 2019-06-02 ENCOUNTER — Other Ambulatory Visit: Payer: Self-pay

## 2019-06-02 DIAGNOSIS — Z87891 Personal history of nicotine dependence: Secondary | ICD-10-CM | POA: Insufficient documentation

## 2019-06-02 DIAGNOSIS — Z8781 Personal history of (healed) traumatic fracture: Secondary | ICD-10-CM

## 2019-06-02 DIAGNOSIS — R0902 Hypoxemia: Secondary | ICD-10-CM | POA: Diagnosis not present

## 2019-06-02 DIAGNOSIS — W19XXXA Unspecified fall, initial encounter: Secondary | ICD-10-CM | POA: Diagnosis not present

## 2019-06-02 DIAGNOSIS — J449 Chronic obstructive pulmonary disease, unspecified: Secondary | ICD-10-CM | POA: Diagnosis not present

## 2019-06-02 DIAGNOSIS — Z79899 Other long term (current) drug therapy: Secondary | ICD-10-CM | POA: Insufficient documentation

## 2019-06-02 DIAGNOSIS — M545 Low back pain, unspecified: Secondary | ICD-10-CM

## 2019-06-02 DIAGNOSIS — R52 Pain, unspecified: Secondary | ICD-10-CM | POA: Diagnosis not present

## 2019-06-02 DIAGNOSIS — M5489 Other dorsalgia: Secondary | ICD-10-CM | POA: Diagnosis not present

## 2019-06-02 HISTORY — DX: Malignant neoplasm of bone and articular cartilage, unspecified: C41.9

## 2019-06-02 MED ORDER — MORPHINE SULFATE (PF) 4 MG/ML IV SOLN
4.0000 mg | Freq: Once | INTRAVENOUS | Status: AC
  Administered 2019-06-02: 4 mg via INTRAVENOUS
  Filled 2019-06-02: qty 1

## 2019-06-02 MED ORDER — DIAZEPAM 5 MG PO TABS: 5.0000 mg | ORAL_TABLET | Freq: Four times a day (QID) | ORAL | 0 refills | Status: DC | PRN

## 2019-06-02 NOTE — Discharge Instructions (Addendum)
Continue your oxycodone as previously prescribed.  Begin taking Valium in addition to the oxycodone as needed for pain.  Increase your prednisone dose to 10 mg for the next 5 days.  Follow-up with your neurosurgeon as scheduled on Monday, and return to the ER if symptoms significantly worsen or change.

## 2019-06-02 NOTE — ED Provider Notes (Signed)
[facility] Provider Note   CSN: [id number] Arrival date & time: 06/02/19  1003     History Chief Complaint  Patient presents with  . Back Pain    [name] is a 86 y.o. male.  Patient is an 86 year old male with past medical history of COPD, osteopenia with compression fracture/kyphoplasty.  He presents today for evaluation of increased low back pain.  He was seen 2 days ago with similar complaints.  His pain began when he bent over to pick up a piece of wood off the ground.  He felt a pop in his back followed by severe pain.  He is diagnosed with worsening compression fracture of L4 and discharged with Percocet.  His pain has not been under control and patient has had difficulty getting around.  He called the ambulance this morning and was transported here.  He was given 2 doses of morphine in route with some relief.  He denies any radiation of his pain into his legs.  He denies any bowel or bladder complaints.  The history is provided by the patient.  Back Pain Location:  Lumbar spine Quality:  Stabbing Radiates to:  Does not radiate Pain severity:  Severe Duration:  2 days Timing:  Constant Progression:  Unchanged Relieved by:  Nothing Worsened by:  Palpation, ambulation and movement Ineffective treatments:  Narcotics      Past Medical History:  Diagnosis Date  . Bone cancer ([place])   . Chronic kidney insufficiency     Patient Active Problem List   Diagnosis Date Noted  . COPD with acute exacerbation ([place]) 05/20/2018  . COPD exacerbation ([place]) 05/19/2018  . Pulmonary nodule 05/19/2018  . Bronchiectasis ([place]) 05/19/2018  . Anemia 09/17/2017  . Abnormal CT of the abdomen 11/02/2016  . Loss of weight 11/02/2016  . History of colonic polyps 11/02/2016  . GERD (gastroesophageal reflux disease) 10/01/2015    Past Surgical History:  Procedure Laterality Date  . BACK SURGERY    . CATARACT EXTRACTION W/PHACO Right 03/09/2016   Procedure:  CATARACT EXTRACTION PHACO AND INTRAOCULAR LENS PLACEMENT RIGHT EYE CDE=8.58;  Surgeon: Tonny Branch, MD;  Location: [facility];  Service: Ophthalmology;  Laterality: Right;  right  . CATARACT EXTRACTION W/PHACO Left 04/13/2016   Procedure: CATARACT EXTRACTION PHACO AND INTRAOCULAR LENS PLACEMENT (IOC);  Surgeon: Tonny Branch, MD;  Location: [facility];  Service: Ophthalmology;  Laterality: Left;  CDE: 8.08  . None to Date  10/01/15       Family History  Problem Relation Age of Onset  . Kidney disease Mother   . Kidney disease Sister   . Colon cancer Neg Hx   . Gastric cancer Neg Hx   . Esophageal cancer Neg Hx     Social History   Tobacco Use  . Smoking status: Former Smoker    Years: 15.00    Types: Cigarettes, Cigars    Quit date: 10/01/1990    Years since quitting: 28.6  . Smokeless tobacco: Never Used  . Tobacco comment: Quit x 25-30 years; 2 cigars daily when smoked  Substance Use Topics  . Alcohol use: Not Currently    Alcohol/week: 0.0 standard drinks  . Drug use: No    Home Medications Prior to Admission medications   Medication Sig Start Date End Date Taking? Authorizing Provider  albuterol (PROVENTIL HFA;VENTOLIN HFA) 108 (90 Base) MCG/ACT inhaler Inhale 2 puffs into the lungs every 4 (four) hours as needed for wheezing or shortness of breath. 05/21/18  Johnson, Clanford L, MD  dextromethorphan (DELSYM) 30 MG/5ML liquid Take 5 mLs (30 mg total) by mouth as needed for cough. Patient not taking: Reported on 04/11/2019 05/21/18   Murlean Iba, MD  HYDROcodone-acetaminophen (NORCO) 5-325 MG tablet Take 1 tablet by mouth every 4 (four) hours as needed. 03/15/19   Daleen Bo, MD  lidocaine (LIDODERM) 5 % Place 1 patch onto the skin daily. Remove & Discard patch within 12 hours or as directed by MD 05/31/19   Evalee Jefferson, PA-C  methocarbamol (ROBAXIN) 500 MG tablet Take 1 tablet (500 mg total) by mouth 3 (three) times daily. Patient not taking: Reported on 04/11/2019 03/12/19    Lily Kocher, PA-C  Multiple Vitamin (MULTIVITAMIN WITH MINERALS) TABS tablet Take 1 tablet by mouth daily. 05/22/18   Johnson, Clanford L, MD  oxyCODONE-acetaminophen (PERCOCET/ROXICET) 5-325 MG tablet Take 1 tablet by mouth every 4 (four) hours as needed. 05/31/19   Idol, Almyra Free, PA-C  predniSONE (DELTASONE) 5 MG tablet Take 5 tablets daily with breakfast for 5 days, then resume 1 tablet daily 05/22/18   Johnson, Clanford L, MD  raNITIdine HCl (ZANTAC PO) Take 1 tablet by mouth daily as needed (for heartburn).    [provider]    Allergies    Patient has no known allergies.  Review of Systems   Review of Systems  Musculoskeletal: Positive for back pain.  All other systems reviewed and are negative.   Physical Exam Updated Vital Signs BP (!) 114/59 (BP Location: Right Arm)   Pulse 65   Temp 97.8 F (36.6 C) (Oral)   Resp 20   Ht 6\' 4"  (1.93 m)   Wt 63.5 kg   BMI 17.04 kg/m   Physical Exam Vitals and nursing note reviewed.  Constitutional:      General: He is not in acute distress.    Appearance: Normal appearance. He is not ill-appearing.  HENT:     Head: Normocephalic and atraumatic.  Pulmonary:     Effort: Pulmonary effort is normal.  Musculoskeletal:     Comments: There is no bony tenderness within the lumbar region.  He has pain with range of motion of his back.  Skin:    General: Skin is warm and dry.  Neurological:     Mental Status: He is oriented to person, place, and time.     Comments: Strength is 5 out of 5 in both lower extremities and sensation is intact throughout both legs.     ED Results / Procedures / Treatments   Labs (all labs ordered are listed, but only abnormal results are displayed) Labs Reviewed - No data to display  EKG None  Radiology DG Thoracic Spine 2 View  Result Date: 05/31/2019 CLINICAL DATA:  Back pain after lifting this morning. EXAM: THORACIC SPINE 2 VIEWS COMPARISON:  Thoracic MRI, 09/21/2011. Chest radiographs,  12/08/2017. FINDINGS: There are wedge-shaped compression fractures of T6, T7, T8 and T9. T8 fracture is new since the prior thoracic MRI. However, it appears stable from prior lateral chest radiograph from July 2019. No convincing acute fracture. No malalignment. Skeletal structures are diffusely demineralized. Soft tissues are unremarkable. IMPRESSION: 1. No acute fracture. 2. Chronic midthoracic spine compression fractures. Diffuse skeletal demineralization. Electronically Signed   By: Lajean Manes M.D.   On: 05/31/2019 14:46   DG Lumbar Spine Complete  Result Date: 05/31/2019 CLINICAL DATA:  Low back pain after lifting this morning. History of kyphoplasty 1 month ago. EXAM: LUMBAR SPINE - COMPLETE 4+ VIEW  COMPARISON:  09/16/2017 FINDINGS: Previously seen fracture of L2 has been treated with kyphoplasty. There is a chronic fracture of L3. Is a mild to moderate compression deformity of L4 which appears increased in severity from the prior exam. Mild loss vertebral body height of L5 is stable. No spondylolisthesis. Mild loss of disc height at L5-S1. Remaining discs are well preserved in height. Skeletal structures are diffusely demineralized. There are scattered calcifications along a normal caliber abdominal aorta. IMPRESSION: 1. Increase in loss of vertebral body height of L4 since the prior study, which could reflect an acute on chronic insufficiency fracture. 2. Other fractures are stable from prior radiographs. 3. No malalignment. Electronically Signed   By: Lajean Manes M.D.   On: 05/31/2019 14:42    Procedures Procedures (including critical care time)  Medications Ordered in ED Medications - No data to display  ED Course  I have reviewed the triage vital signs and the nursing notes.  Pertinent labs & imaging results that were available during my care of the patient were reviewed by me and considered in my medical decision making (see chart for details).    MDM Rules/Calculators/A&P   Patient is an 86 year old male with history of osteopenia and degenerative disc disease.  He has also had compression fractures requiring kyphoplasty.  He presents today with severe back pain.  He was seen 2 days ago with similar complaints and prescribed Percocet which is not helping.  Patient received 2 doses of morphine in the ambulance and an additional dose here and is now feeling better.  CT scan shows no new compression fracture or obvious cause of his pain.  His aorta shows no evidence for aneurysm.  At this point, I feel as though patient is appropriate for discharge.  I will prescribe Valium he can take in addition to the Percocet.  He is to follow-up on Monday with his neurosurgeon as scheduled.  Final Clinical Impression(s) / ED Diagnoses Final diagnoses:  None    Rx / DC Orders ED Discharge Orders    None       Veryl Speak, MD 06/02/19 1347

## 2019-06-02 NOTE — ED Triage Notes (Signed)
Patient brought in by EMS from home for complaint of middle back pain since Wednesday. Per EMS, patient was given 8 mg morphine en route to ER. Patient states he was treated here for same on Wednesday.

## 2019-06-05 ENCOUNTER — Inpatient Hospital Stay (HOSPITAL_COMMUNITY)
Admission: EM | Admit: 2019-06-05 | Discharge: 2019-06-12 | DRG: 516 | Disposition: A | Discharge status: Home Health | Payer: Medicare Other | Attending: Internal Medicine | Admitting: Internal Medicine

## 2019-06-05 ENCOUNTER — Emergency Department (HOSPITAL_COMMUNITY): Payer: Medicare Other

## 2019-06-05 ENCOUNTER — Other Ambulatory Visit: Payer: Self-pay

## 2019-06-05 DIAGNOSIS — M4854XA Collapsed vertebra, not elsewhere classified, thoracic region, initial encounter for fracture: Secondary | ICD-10-CM | POA: Diagnosis not present

## 2019-06-05 DIAGNOSIS — Z87891 Personal history of nicotine dependence: Secondary | ICD-10-CM | POA: Diagnosis not present

## 2019-06-05 DIAGNOSIS — N1832 Chronic kidney disease, stage 3b: Secondary | ICD-10-CM | POA: Diagnosis not present

## 2019-06-05 DIAGNOSIS — D696 Thrombocytopenia, unspecified: Secondary | ICD-10-CM | POA: Diagnosis present

## 2019-06-05 DIAGNOSIS — S32020D Wedge compression fracture of second lumbar vertebra, subsequent encounter for fracture with routine healing: Secondary | ICD-10-CM | POA: Diagnosis not present

## 2019-06-05 DIAGNOSIS — Z9889 Other specified postprocedural states: Secondary | ICD-10-CM

## 2019-06-05 DIAGNOSIS — D649 Anemia, unspecified: Secondary | ICD-10-CM | POA: Diagnosis present

## 2019-06-05 DIAGNOSIS — M545 Low back pain, unspecified: Secondary | ICD-10-CM

## 2019-06-05 DIAGNOSIS — Z86010 Personal history of colonic polyps: Secondary | ICD-10-CM

## 2019-06-05 DIAGNOSIS — S22080A Wedge compression fracture of T11-T12 vertebra, initial encounter for closed fracture: Secondary | ICD-10-CM | POA: Diagnosis not present

## 2019-06-05 DIAGNOSIS — S32000D Wedge compression fracture of unspecified lumbar vertebra, subsequent encounter for fracture with routine healing: Secondary | ICD-10-CM | POA: Diagnosis not present

## 2019-06-05 DIAGNOSIS — R911 Solitary pulmonary nodule: Secondary | ICD-10-CM | POA: Diagnosis present

## 2019-06-05 DIAGNOSIS — Z79899 Other long term (current) drug therapy: Secondary | ICD-10-CM

## 2019-06-05 DIAGNOSIS — Z9841 Cataract extraction status, right eye: Secondary | ICD-10-CM

## 2019-06-05 DIAGNOSIS — R4182 Altered mental status, unspecified: Secondary | ICD-10-CM | POA: Diagnosis not present

## 2019-06-05 DIAGNOSIS — M4856XA Collapsed vertebra, not elsewhere classified, lumbar region, initial encounter for fracture: Secondary | ICD-10-CM | POA: Diagnosis present

## 2019-06-05 DIAGNOSIS — Z7952 Long term (current) use of systemic steroids: Secondary | ICD-10-CM

## 2019-06-05 DIAGNOSIS — X500XXA Overexertion from strenuous movement or load, initial encounter: Secondary | ICD-10-CM

## 2019-06-05 DIAGNOSIS — Z9842 Cataract extraction status, left eye: Secondary | ICD-10-CM

## 2019-06-05 DIAGNOSIS — M4855XA Collapsed vertebra, not elsewhere classified, thoracolumbar region, initial encounter for fracture: Secondary | ICD-10-CM | POA: Diagnosis not present

## 2019-06-05 DIAGNOSIS — M549 Dorsalgia, unspecified: Secondary | ICD-10-CM | POA: Diagnosis present

## 2019-06-05 DIAGNOSIS — D61818 Other pancytopenia: Secondary | ICD-10-CM | POA: Diagnosis not present

## 2019-06-05 DIAGNOSIS — Z20822 Contact with and (suspected) exposure to covid-19: Secondary | ICD-10-CM | POA: Diagnosis present

## 2019-06-05 DIAGNOSIS — N189 Chronic kidney disease, unspecified: Secondary | ICD-10-CM | POA: Diagnosis not present

## 2019-06-05 DIAGNOSIS — Z841 Family history of disorders of kidney and ureter: Secondary | ICD-10-CM | POA: Diagnosis not present

## 2019-06-05 DIAGNOSIS — N1831 Chronic kidney disease, stage 3a: Secondary | ICD-10-CM | POA: Diagnosis not present

## 2019-06-05 DIAGNOSIS — S32050D Wedge compression fracture of fifth lumbar vertebra, subsequent encounter for fracture with routine healing: Secondary | ICD-10-CM | POA: Diagnosis not present

## 2019-06-05 DIAGNOSIS — Z981 Arthrodesis status: Secondary | ICD-10-CM | POA: Diagnosis not present

## 2019-06-05 DIAGNOSIS — D469 Myelodysplastic syndrome, unspecified: Secondary | ICD-10-CM | POA: Diagnosis present

## 2019-06-05 DIAGNOSIS — S22089A Unspecified fracture of T11-T12 vertebra, initial encounter for closed fracture: Secondary | ICD-10-CM

## 2019-06-05 DIAGNOSIS — G8929 Other chronic pain: Secondary | ICD-10-CM | POA: Diagnosis present

## 2019-06-05 DIAGNOSIS — S32030D Wedge compression fracture of third lumbar vertebra, subsequent encounter for fracture with routine healing: Secondary | ICD-10-CM | POA: Diagnosis not present

## 2019-06-05 DIAGNOSIS — K219 Gastro-esophageal reflux disease without esophagitis: Secondary | ICD-10-CM | POA: Diagnosis present

## 2019-06-05 DIAGNOSIS — S32040D Wedge compression fracture of fourth lumbar vertebra, subsequent encounter for fracture with routine healing: Secondary | ICD-10-CM | POA: Diagnosis not present

## 2019-06-05 DIAGNOSIS — J441 Chronic obstructive pulmonary disease with (acute) exacerbation: Secondary | ICD-10-CM | POA: Diagnosis not present

## 2019-06-05 DIAGNOSIS — Z79891 Long term (current) use of opiate analgesic: Secondary | ICD-10-CM | POA: Diagnosis not present

## 2019-06-05 DIAGNOSIS — J449 Chronic obstructive pulmonary disease, unspecified: Secondary | ICD-10-CM | POA: Diagnosis not present

## 2019-06-05 HISTORY — DX: Solitary pulmonary nodule: R91.1

## 2019-06-05 HISTORY — DX: Chronic obstructive pulmonary disease, unspecified: J44.9

## 2019-06-05 HISTORY — DX: Gastro-esophageal reflux disease without esophagitis: K21.9

## 2019-06-05 LAB — COMPREHENSIVE METABOLIC PANEL
ALT: 15 U/L (ref 0–44)
AST: 16 U/L (ref 15–41)
Albumin: 4 g/dL (ref 3.5–5.0)
Alkaline Phosphatase: 61 U/L (ref 38–126)
Anion gap: 12 (ref 5–15)
BUN: 27 mg/dL — ABNORMAL HIGH (ref 8–23)
CO2: 23 mmol/L (ref 22–32)
Calcium: 9.6 mg/dL (ref 8.9–10.3)
Chloride: 105 mmol/L (ref 98–111)
Creatinine, Ser: 1.59 mg/dL — ABNORMAL HIGH (ref 0.61–1.24)
GFR calc Af Amer: 45 mL/min — ABNORMAL LOW (ref >60)
GFR calc non Af Amer: 38 mL/min — ABNORMAL LOW (ref >60)
Glucose, Bld: 93 mg/dL (ref 70–99)
Potassium: 3.7 mmol/L (ref 3.5–5.1)
Sodium: 140 mmol/L (ref 135–145)
Total Bilirubin: 1.4 mg/dL — ABNORMAL HIGH (ref 0.3–1.2)
Total Protein: 6.8 g/dL (ref 6.5–8.1)

## 2019-06-05 LAB — CBC
HCT: 33.8 % — ABNORMAL LOW (ref 39.0–52.0)
Hemoglobin: 10.6 g/dL — ABNORMAL LOW (ref 13.0–17.0)
MCH: 28.6 pg (ref 26.0–34.0)
MCHC: 31.4 g/dL (ref 30.0–36.0)
MCV: 91.1 fL (ref 80.0–100.0)
Platelets: 100 10*3/uL — ABNORMAL LOW (ref 150–400)
RBC: 3.71 MIL/uL — ABNORMAL LOW (ref 4.22–5.81)
RDW: 20.5 % — ABNORMAL HIGH (ref 11.5–15.5)
WBC: 5.3 10*3/uL (ref 4.0–10.5)
nRBC: 0 % (ref 0.0–0.2)

## 2019-06-05 LAB — CBG MONITORING, ED: Glucose-Capillary: 87 mg/dL (ref 70–99)

## 2019-06-05 MED ORDER — DEXAMETHASONE SODIUM PHOSPHATE 10 MG/ML IJ SOLN
10.0000 mg | Freq: Once | INTRAMUSCULAR | Status: AC
  Administered 2019-06-06: 10 mg via INTRAVENOUS
  Filled 2019-06-05: qty 1

## 2019-06-05 MED ORDER — OXYCODONE-ACETAMINOPHEN 5-325 MG PO TABS
1.0000 | ORAL_TABLET | Freq: Once | ORAL | Status: AC
  Administered 2019-06-05: 19:00:00 1 via ORAL
  Filled 2019-06-05: qty 1

## 2019-06-05 MED ORDER — SODIUM CHLORIDE 0.9 % IV BOLUS: 500.0000 mL | Freq: Once | INTRAVENOUS | Status: DC

## 2019-06-05 MED ORDER — HYDROMORPHONE HCL 1 MG/ML IJ SOLN
0.5000 mg | Freq: Once | INTRAMUSCULAR | Status: AC
  Administered 2019-06-06: 0.5 mg via INTRAVENOUS
  Filled 2019-06-05: qty 1

## 2019-06-05 MED ORDER — SODIUM CHLORIDE 0.9% FLUSH: 3.0000 mL | Freq: Once | INTRAVENOUS | Status: DC

## 2019-06-05 MED ORDER — METHOCARBAMOL 1000 MG/10ML IJ SOLN
500.0000 mg | Freq: Once | INTRAVENOUS | Status: AC
  Administered 2019-06-06: 500 mg via INTRAVENOUS
  Filled 2019-06-05: qty 5

## 2019-06-05 NOTE — ED Notes (Signed)
Pt's friend Mikle Bosworth in chart-requests to be the primary contact, as other family is out of town.

## 2019-06-05 NOTE — ED Notes (Signed)
Daughter was updated on status and plan of care

## 2019-06-05 NOTE — ED Notes (Signed)
Off floor to scan

## 2019-06-05 NOTE — ED Triage Notes (Addendum)
Pt had lower back surgery one month ago. Since then pt having mid-back pain and his doctor advises family to bring to ER for CT scan of back and pain management. Pt losing appetite and falling multiple times. Pt taking oxycodone and muscle relaxer at home with no relief. Pt accompanied by friend who provides history. Sts he went to [facility] recently for same but they did the scan on the wrong part of his back. Pt slumping over in the chair, almost falling out of the chair.

## 2019-06-05 NOTE — ED Provider Notes (Signed)
[facility] EMERGENCY DEPARTMENT Provider Note   CSN: [id number] Arrival date & time: 06/05/19  1453     History Chief Complaint  Patient presents with  . Back Pain  . Fall  . Altered Mental Status    [name] is a 86 y.o. male.   86 y.o. male with a history of COPD, anemia and GERD and multiple thoracic and lumbar compression fractures s/p kyphoplasty by Dr. Kathyrn Sheriff presents to the emergency department for evaluation of back pain.  He states that he has had constant pain over the past 5 days to his mid to low back.  This pain is aggravated with any type of movement or ambulation.  He states that he is able to "shuffle" around his house with the help of a cane or walker.  Describes the pain as "a knife in my back".  States that Percocet and Valium well dull the pain only slightly, but has mostly provided no symptomatic relief.  He denies any new trauma or injury since his last ED visit.  No fevers, urinary symptoms/dysuria, bowel or bladder incontinence, genital or perianal numbness, numbness or tingling in his BLE.    Back Pain Fall  Altered Mental Status      Past Medical History:  Diagnosis Date  . Bone cancer ([place])   . Chronic kidney insufficiency     Patient Active Problem List   Diagnosis Date Noted  . COPD with acute exacerbation ([place]) 05/20/2018  . COPD exacerbation ([place]) 05/19/2018  . Pulmonary nodule 05/19/2018  . Bronchiectasis ([place]) 05/19/2018  . Anemia 09/17/2017  . Abnormal CT of the abdomen 11/02/2016  . Loss of weight 11/02/2016  . History of colonic polyps 11/02/2016  . GERD (gastroesophageal reflux disease) 10/01/2015    Past Surgical History:  Procedure Laterality Date  . BACK SURGERY    . CATARACT EXTRACTION W/PHACO Right 03/09/2016   Procedure: CATARACT EXTRACTION PHACO AND INTRAOCULAR LENS PLACEMENT RIGHT EYE CDE=8.58;  Surgeon: Tonny Branch, MD;  Location: [facility];  Service: Ophthalmology;  Laterality: Right;  right    . CATARACT EXTRACTION W/PHACO Left 04/13/2016   Procedure: CATARACT EXTRACTION PHACO AND INTRAOCULAR LENS PLACEMENT (IOC);  Surgeon: Tonny Branch, MD;  Location: [facility];  Service: Ophthalmology;  Laterality: Left;  CDE: 8.08  . None to Date  10/01/15       Family History  Problem Relation Age of Onset  . Kidney disease Mother   . Kidney disease Sister   . Colon cancer Neg Hx   . Gastric cancer Neg Hx   . Esophageal cancer Neg Hx     Social History   Tobacco Use  . Smoking status: Former Smoker    Years: 15.00    Types: Cigarettes, Cigars    Quit date: 10/01/1990    Years since quitting: 28.6  . Smokeless tobacco: Never Used  . Tobacco comment: Quit x 25-30 years; 2 cigars daily when smoked  Substance Use Topics  . Alcohol use: Not Currently    Alcohol/week: 0.0 standard drinks  . Drug use: No    Home Medications Prior to Admission medications   Medication Sig Start Date End Date Taking? Authorizing Provider  albuterol (PROVENTIL HFA;VENTOLIN HFA) 108 (90 Base) MCG/ACT inhaler Inhale 2 puffs into the lungs every 4 (four) hours as needed for wheezing or shortness of breath. 05/21/18   Johnson, Clanford L, MD  dextromethorphan (DELSYM) 30 MG/5ML liquid Take 5 mLs (30 mg total) by mouth as needed for cough.  Patient not taking: Reported on 04/11/2019 05/21/18   Murlean Iba, MD  diazepam (VALIUM) 5 MG tablet Take 1 tablet (5 mg total) by mouth every 6 (six) hours as needed for anxiety (spasms). 06/02/19   Veryl Speak, MD  HYDROcodone-acetaminophen (NORCO) 5-325 MG tablet Take 1 tablet by mouth every 4 (four) hours as needed. 03/15/19   Daleen Bo, MD  lidocaine (LIDODERM) 5 % Place 1 patch onto the skin daily. Remove & Discard patch within 12 hours or as directed by MD 05/31/19   Evalee Jefferson, PA-C  methocarbamol (ROBAXIN) 500 MG tablet Take 1 tablet (500 mg total) by mouth 3 (three) times daily. Patient not taking: Reported on 04/11/2019 03/12/19   Lily Kocher, PA-C   Multiple Vitamin (MULTIVITAMIN WITH MINERALS) TABS tablet Take 1 tablet by mouth daily. 05/22/18   Johnson, Clanford L, MD  oxyCODONE-acetaminophen (PERCOCET/ROXICET) 5-325 MG tablet Take 1 tablet by mouth every 4 (four) hours as needed. 05/31/19   Idol, Almyra Free, PA-C  predniSONE (DELTASONE) 5 MG tablet Take 5 tablets daily with breakfast for 5 days, then resume 1 tablet daily 05/22/18   Johnson, Clanford L, MD  raNITIdine HCl (ZANTAC PO) Take 1 tablet by mouth daily as needed (for heartburn).    [provider]    Allergies    Patient has no known allergies.  Review of Systems   Review of Systems  Musculoskeletal: Positive for back pain.  Ten systems reviewed and are negative for acute change, except as noted in the HPI.    Physical Exam Updated Vital Signs BP 123/77   Pulse 77   Temp 97.7 F (36.5 C) (Oral)   Resp (!) 22   SpO2 93%   Physical Exam Vitals and nursing note reviewed.  Constitutional:      Appearance: He is well-developed. He is not diaphoretic.     Comments: Appears uncomfortable, tearful  HENT:     Head: Normocephalic and atraumatic.  Eyes:     General: No scleral icterus.    Conjunctiva/sclera: Conjunctivae normal.  Cardiovascular:     Rate and Rhythm: Normal rate and regular rhythm.     Pulses: Normal pulses.  Pulmonary:     Effort: Pulmonary effort is normal. No respiratory distress.     Comments: Respirations even and unlabored Musculoskeletal:        General: Normal range of motion.     Cervical back: Normal range of motion.     Comments: No reproducible TTP to the lumbosacral midline. No bony deformities, step offs, crepitus. No paraspinal lumbar TTP on exam.   Skin:    General: Skin is warm and dry.     Coloration: Skin is not pale.     Findings: No erythema or rash.  Neurological:     General: No focal deficit present.     Mental Status: He is alert and oriented to person, place, and time.     Coordination: Coordination normal.      Comments: Sensation to light touch intact in bilateral lower extremities.  Moving all extremities spontaneously.  Psychiatric:        Behavior: Behavior normal.     ED Results / Procedures / Treatments   Labs (all labs ordered are listed, but only abnormal results are displayed) Labs Reviewed  COMPREHENSIVE METABOLIC PANEL - Abnormal; Notable for the following components:      Result Value   BUN 27 (*)    Creatinine, Ser 1.59 (*)    Total Bilirubin 1.4 (*)  GFR calc non Af Amer 38 (*)    GFR calc Af Amer 45 (*)    All other components within normal limits  CBC - Abnormal; Notable for the following components:   RBC 3.71 (*)    Hemoglobin 10.6 (*)    HCT 33.8 (*)    RDW 20.5 (*)    Platelets 100 (*)    All other components within normal limits  SARS CORONAVIRUS 2 (TAT 6-24 HRS)  URINALYSIS, ROUTINE W REFLEX MICROSCOPIC  CBG MONITORING, ED    EKG EKG Interpretation  Date/Time:  Monday June 05 2019 15:32:55 EST Ventricular Rate:  84 PR Interval:  132 QRS Duration: 92 QT Interval:  358 QTC Calculation: 423 R Axis:   59 Text Interpretation: Normal sinus rhythm Anterior infarct , age undetermined Abnormal ECG Confirmed by Lacretia Leigh ([id number]) on 06/05/2019 9:14:41 PM   Radiology DG Lumbar Spine Complete  Result Date: 06/05/2019 CLINICAL DATA:  Back pain. Patient reports low back surgery 1 month ago, pain since that time. EXAM: LUMBAR SPINE - COMPLETE 4+ VIEW COMPARISON:  Lumbar spine CT 06/02/2019, thoracic and lumbar spine radiographs 05/31/2019 FINDINGS: Chronic compression fractures of L2, L3, L4 and L5, unchanged from recent CT. Vertebral augmentation within L2. Compression fracture of T11 and T9 are unchanged from recent radiograph. Bones are diffusely under mineralized. No evidence of acute fracture. Sacroiliac joints are congruent. IMPRESSION: Chronic compression fractures of T9, T11, L2, L3, L4 and L5 from recent imaging. No evidence of acute fracture.  Electronically Signed   By: Keith Rake M.D.   On: 06/05/2019 23:28    Procedures Procedures (including critical care time)  Medications Ordered in ED Medications  sodium chloride flush (NS) 0.9 % injection 3 mL (has no administration in time range)  HYDROmorphone (DILAUDID) injection 0.5 mg (has no administration in time range)  methocarbamol (ROBAXIN) 500 mg in dextrose 5 % 50 mL IVPB (has no administration in time range)  dexamethasone (DECADRON) injection 10 mg (has no administration in time range)  sodium chloride 0.9 % bolus 500 mL (has no administration in time range)  oxyCODONE-acetaminophen (PERCOCET/ROXICET) 5-325 MG per tablet 1 tablet (1 tablet Oral Given 06/05/19 1833)    ED Course  I have reviewed the triage vital signs and the nursing notes.  Pertinent labs & imaging results that were available during my care of the patient were reviewed by me and considered in my medical decision making (see chart for details).    MDM Rules/Calculators/A&P                       86 year old male presenting for worsening acute on chronic low back pain.  Has been followed by Dr. Kathyrn Sheriff of neurosurgery in the past with remote history of kyphoplasty 1 month ago.  He has had worsening pain since bending to pick up a 2 x 4 piece of wood 5 days ago.  This is his third ED visit for similar complaints.  Has had no relief with Valium, Percocet, steroids.  Is presently neurovascularly intact.  He has no red flags or signs concerning for cauda equina.  X-ray of the low back was repeated which shows no acute change.  He last had CT of his lumbar spine completed 3 days ago which was also stable.  The case was discussed with Dr. Zada Finders of neurosurgery.  Their service plans to consult on the patient in the morning.  Will admit to hospitalist for ongoing pain control.  Patient  agreeable to plan.   Final Clinical Impression(s) / ED Diagnoses Final diagnoses:  Acute exacerbation of chronic low  back pain  Compression fracture of lumbar vertebra with routine healing, subsequent encounter    Rx / DC Orders ED Discharge Orders    None       Antonietta Breach, PA-C 06/05/19 2357    Lacretia Leigh, MD 06/06/19 [phone]

## 2019-06-06 ENCOUNTER — Observation Stay (HOSPITAL_COMMUNITY): Payer: Medicare Other

## 2019-06-06 ENCOUNTER — Encounter (HOSPITAL_COMMUNITY): Payer: Self-pay | Admitting: Internal Medicine

## 2019-06-06 DIAGNOSIS — D469 Myelodysplastic syndrome, unspecified: Secondary | ICD-10-CM | POA: Diagnosis present

## 2019-06-06 DIAGNOSIS — Z79891 Long term (current) use of opiate analgesic: Secondary | ICD-10-CM | POA: Diagnosis not present

## 2019-06-06 DIAGNOSIS — Z87891 Personal history of nicotine dependence: Secondary | ICD-10-CM | POA: Diagnosis not present

## 2019-06-06 DIAGNOSIS — M4854XA Collapsed vertebra, not elsewhere classified, thoracic region, initial encounter for fracture: Secondary | ICD-10-CM | POA: Diagnosis present

## 2019-06-06 DIAGNOSIS — Z9889 Other specified postprocedural states: Secondary | ICD-10-CM | POA: Diagnosis not present

## 2019-06-06 DIAGNOSIS — J449 Chronic obstructive pulmonary disease, unspecified: Secondary | ICD-10-CM | POA: Diagnosis present

## 2019-06-06 DIAGNOSIS — D696 Thrombocytopenia, unspecified: Secondary | ICD-10-CM | POA: Diagnosis not present

## 2019-06-06 DIAGNOSIS — Z86010 Personal history of colonic polyps: Secondary | ICD-10-CM | POA: Diagnosis not present

## 2019-06-06 DIAGNOSIS — Z981 Arthrodesis status: Secondary | ICD-10-CM | POA: Diagnosis not present

## 2019-06-06 DIAGNOSIS — G8929 Other chronic pain: Secondary | ICD-10-CM | POA: Diagnosis present

## 2019-06-06 DIAGNOSIS — D649 Anemia, unspecified: Secondary | ICD-10-CM | POA: Diagnosis not present

## 2019-06-06 DIAGNOSIS — X500XXA Overexertion from strenuous movement or load, initial encounter: Secondary | ICD-10-CM | POA: Diagnosis not present

## 2019-06-06 DIAGNOSIS — M545 Low back pain, unspecified: Secondary | ICD-10-CM | POA: Diagnosis present

## 2019-06-06 DIAGNOSIS — M4856XA Collapsed vertebra, not elsewhere classified, lumbar region, initial encounter for fracture: Secondary | ICD-10-CM | POA: Diagnosis present

## 2019-06-06 DIAGNOSIS — N189 Chronic kidney disease, unspecified: Secondary | ICD-10-CM | POA: Diagnosis not present

## 2019-06-06 DIAGNOSIS — R911 Solitary pulmonary nodule: Secondary | ICD-10-CM | POA: Diagnosis present

## 2019-06-06 DIAGNOSIS — Z79899 Other long term (current) drug therapy: Secondary | ICD-10-CM | POA: Diagnosis not present

## 2019-06-06 DIAGNOSIS — K219 Gastro-esophageal reflux disease without esophagitis: Secondary | ICD-10-CM | POA: Diagnosis present

## 2019-06-06 DIAGNOSIS — Z9841 Cataract extraction status, right eye: Secondary | ICD-10-CM | POA: Diagnosis not present

## 2019-06-06 DIAGNOSIS — Z9842 Cataract extraction status, left eye: Secondary | ICD-10-CM | POA: Diagnosis not present

## 2019-06-06 DIAGNOSIS — M549 Dorsalgia, unspecified: Secondary | ICD-10-CM | POA: Diagnosis present

## 2019-06-06 DIAGNOSIS — S32000D Wedge compression fracture of unspecified lumbar vertebra, subsequent encounter for fracture with routine healing: Secondary | ICD-10-CM | POA: Diagnosis not present

## 2019-06-06 DIAGNOSIS — S22080A Wedge compression fracture of T11-T12 vertebra, initial encounter for closed fracture: Secondary | ICD-10-CM | POA: Diagnosis not present

## 2019-06-06 DIAGNOSIS — J441 Chronic obstructive pulmonary disease with (acute) exacerbation: Secondary | ICD-10-CM | POA: Diagnosis not present

## 2019-06-06 DIAGNOSIS — D61818 Other pancytopenia: Secondary | ICD-10-CM | POA: Diagnosis present

## 2019-06-06 DIAGNOSIS — Z841 Family history of disorders of kidney and ureter: Secondary | ICD-10-CM | POA: Diagnosis not present

## 2019-06-06 DIAGNOSIS — N1832 Chronic kidney disease, stage 3b: Secondary | ICD-10-CM | POA: Diagnosis not present

## 2019-06-06 DIAGNOSIS — Z7952 Long term (current) use of systemic steroids: Secondary | ICD-10-CM | POA: Diagnosis not present

## 2019-06-06 DIAGNOSIS — Z20822 Contact with and (suspected) exposure to covid-19: Secondary | ICD-10-CM | POA: Diagnosis present

## 2019-06-06 DIAGNOSIS — N1831 Chronic kidney disease, stage 3a: Secondary | ICD-10-CM | POA: Diagnosis present

## 2019-06-06 DIAGNOSIS — M4855XA Collapsed vertebra, not elsewhere classified, thoracolumbar region, initial encounter for fracture: Secondary | ICD-10-CM | POA: Diagnosis not present

## 2019-06-06 LAB — URINALYSIS, ROUTINE W REFLEX MICROSCOPIC
Bilirubin Urine: NEGATIVE
Glucose, UA: NEGATIVE mg/dL
Hgb urine dipstick: NEGATIVE
Ketones, ur: NEGATIVE mg/dL
Leukocytes,Ua: NEGATIVE
Nitrite: NEGATIVE
Protein, ur: NEGATIVE mg/dL
Specific Gravity, Urine: 1.023 (ref 1.005–1.030)
pH: 5 (ref 5.0–8.0)

## 2019-06-06 LAB — CBC
HCT: 32.6 % — ABNORMAL LOW (ref 39.0–52.0)
Hemoglobin: 10.5 g/dL — ABNORMAL LOW (ref 13.0–17.0)
MCH: 29 pg (ref 26.0–34.0)
MCHC: 32.2 g/dL (ref 30.0–36.0)
MCV: 90.1 fL (ref 80.0–100.0)
Platelets: 95 10*3/uL — ABNORMAL LOW (ref 150–400)
RBC: 3.62 MIL/uL — ABNORMAL LOW (ref 4.22–5.81)
RDW: 20.5 % — ABNORMAL HIGH (ref 11.5–15.5)
WBC: 2.5 10*3/uL — ABNORMAL LOW (ref 4.0–10.5)
nRBC: 0 % (ref 0.0–0.2)

## 2019-06-06 LAB — BASIC METABOLIC PANEL
Anion gap: 8 (ref 5–15)
BUN: 27 mg/dL — ABNORMAL HIGH (ref 8–23)
CO2: 26 mmol/L (ref 22–32)
Calcium: 9.6 mg/dL (ref 8.9–10.3)
Chloride: 106 mmol/L (ref 98–111)
Creatinine, Ser: 1.36 mg/dL — ABNORMAL HIGH (ref 0.61–1.24)
GFR calc Af Amer: 54 mL/min — ABNORMAL LOW (ref >60)
GFR calc non Af Amer: 46 mL/min — ABNORMAL LOW (ref >60)
Glucose, Bld: 126 mg/dL — ABNORMAL HIGH (ref 70–99)
Potassium: 4.7 mmol/L (ref 3.5–5.1)
Sodium: 140 mmol/L (ref 135–145)

## 2019-06-06 LAB — SARS CORONAVIRUS 2 (TAT 6-24 HRS): SARS Coronavirus 2: NEGATIVE

## 2019-06-06 MED ORDER — METHOCARBAMOL 500 MG PO TABS
500.0000 mg | ORAL_TABLET | Freq: Four times a day (QID) | ORAL | Status: AC
  Administered 2019-06-06 – 2019-06-08 (×10): 500 mg via ORAL
  Filled 2019-06-06 (×10): qty 1

## 2019-06-06 MED ORDER — ACETAMINOPHEN 500 MG PO TABS
1000.0000 mg | ORAL_TABLET | Freq: Three times a day (TID) | ORAL | Status: AC
  Administered 2019-06-06 – 2019-06-10 (×13): 1000 mg via ORAL
  Filled 2019-06-06 (×13): qty 2

## 2019-06-06 MED ORDER — ACETAMINOPHEN 325 MG PO TABS: 650.0000 mg | ORAL_TABLET | Freq: Four times a day (QID) | ORAL | Status: DC | PRN

## 2019-06-06 MED ORDER — ACETAMINOPHEN 650 MG RE SUPP: 650.0000 mg | Freq: Four times a day (QID) | RECTAL | Status: DC | PRN

## 2019-06-06 MED ORDER — ALBUTEROL SULFATE (2.5 MG/3ML) 0.083% IN NEBU: 2.5000 mg | INHALATION_SOLUTION | RESPIRATORY_TRACT | Status: DC | PRN

## 2019-06-06 MED ORDER — SENNA 8.6 MG PO TABS
1.0000 | ORAL_TABLET | Freq: Every day | ORAL | Status: DC
  Administered 2019-06-06 – 2019-06-12 (×7): 8.6 mg via ORAL
  Filled 2019-06-06 (×6): qty 1

## 2019-06-06 MED ORDER — ONDANSETRON HCL 4 MG/2ML IJ SOLN: 4.0000 mg | Freq: Four times a day (QID) | INTRAMUSCULAR | Status: DC | PRN

## 2019-06-06 MED ORDER — ONDANSETRON HCL 4 MG PO TABS
4.0000 mg | ORAL_TABLET | Freq: Four times a day (QID) | ORAL | Status: DC | PRN
  Administered 2019-06-08: 4 mg via ORAL
  Filled 2019-06-06 (×2): qty 1

## 2019-06-06 MED ORDER — OXYCODONE HCL 5 MG PO TABS
5.0000 mg | ORAL_TABLET | ORAL | Status: DC | PRN
  Administered 2019-06-06 – 2019-06-11 (×9): 5 mg via ORAL
  Filled 2019-06-06 (×11): qty 1

## 2019-06-06 MED ORDER — FENTANYL CITRATE (PF) 100 MCG/2ML IJ SOLN
50.0000 ug | INTRAMUSCULAR | Status: DC | PRN
  Administered 2019-06-06 – 2019-06-11 (×17): 50 ug via INTRAVENOUS
  Filled 2019-06-06 (×17): qty 2

## 2019-06-06 MED ORDER — METHOCARBAMOL 500 MG PO TABS: 500.0000 mg | ORAL_TABLET | Freq: Four times a day (QID) | ORAL | Status: DC | PRN

## 2019-06-06 MED ORDER — HYDROMORPHONE HCL 1 MG/ML IJ SOLN
0.5000 mg | Freq: Once | INTRAMUSCULAR | Status: AC
  Administered 2019-06-06: 0.5 mg via INTRAVENOUS
  Filled 2019-06-06: qty 1

## 2019-06-06 NOTE — ED Notes (Signed)
Pain medication effective

## 2019-06-06 NOTE — ED Notes (Signed)
Pt in MRI.

## 2019-06-06 NOTE — ED Notes (Signed)
Pt resting on cart in NAD. Breathing easy, non-labored. Equal rise and fall of chest noted. VSS on monitors. Call light within reach. Hourly rounds completed.

## 2019-06-06 NOTE — ED Notes (Signed)
Urine collected, labeled with 2 pt identifiers, and sent to lab

## 2019-06-06 NOTE — ED Notes (Signed)
covid swab collected, labeled with 2 pt identifiers, and placed in lab

## 2019-06-06 NOTE — Consult Note (Signed)
Chief Complaint   Chief Complaint  Patient presents with  . Back Pain  . Fall  . Altered Mental Status    HPI   Consult requested by: [facility] Reason for consult: Back pain  HPI: [name] is a 86 y.o. male  Well known to Dr. Kathyrn Sheriff who performed a prior L2 vertebral augmentation approximately 1 month ago, who presented to the emergency room with acute back pain.  He attempted to pick up a 4 ft long 2 x 4 in felt to pop in his back several days ago.  He has since been having severe midline and right sided lower back pain.  Pain is debilitating.  No radicular component.  He has been seen and evaluated in the emergency room multiple times over the last several days.  He has undergone a CT scan of his lumbar spine as well as x-rays of his thoracic spine.  Imaging is notable for chronic compression fractures of T9, T11, L2, L3, L4 and L5. He has been on multiple po medications including NSAIDs, narcotics and muscle relaxers without relief.  A neurosurgical consultation was requested for further recommendations. He does note since being in the hospital improvement in pain by about 50%. He is able to sit upright and eat breakfast which is something he hasn't been able to do since the injury. He denies bowel/bladder dysfunction.  Patient Active Problem List   Diagnosis Date Noted  . Acute exacerbation of chronic low back pain 06/06/2019  . Thrombocytopenia ([place]) 06/06/2019  . COPD with acute exacerbation ([place]) 05/20/2018  . COPD exacerbation ([place]) 05/19/2018  . Pulmonary nodule 05/19/2018  . Bronchiectasis ([place]) 05/19/2018  . Anemia 09/17/2017  . Abnormal CT of the abdomen 11/02/2016  . Loss of weight 11/02/2016  . History of colonic polyps 11/02/2016  . GERD (gastroesophageal reflux disease) 10/01/2015    PMH: Past Medical History:  Diagnosis Date  . Bone cancer ([place])   . Chronic kidney insufficiency     PSH: Past Surgical History:  Procedure Laterality Date    . BACK SURGERY    . CATARACT EXTRACTION W/PHACO Right 03/09/2016   Procedure: CATARACT EXTRACTION PHACO AND INTRAOCULAR LENS PLACEMENT RIGHT EYE CDE=8.58;  Surgeon: Tonny Branch, MD;  Location: [facility];  Service: Ophthalmology;  Laterality: Right;  right  . CATARACT EXTRACTION W/PHACO Left 04/13/2016   Procedure: CATARACT EXTRACTION PHACO AND INTRAOCULAR LENS PLACEMENT (IOC);  Surgeon: Tonny Branch, MD;  Location: [facility];  Service: Ophthalmology;  Laterality: Left;  CDE: 8.08  . None to Date  10/01/15    (Not in a hospital admission)   SH: Social History   Tobacco Use  . Smoking status: Former Smoker    Years: 15.00    Types: Cigarettes, Cigars    Quit date: 10/01/1990    Years since quitting: 28.6  . Smokeless tobacco: Never Used  . Tobacco comment: Quit x 25-30 years; 2 cigars daily when smoked  Substance Use Topics  . Alcohol use: Not Currently    Alcohol/week: 0.0 standard drinks  . Drug use: No    MEDS: Prior to Admission medications   Medication Sig Start Date End Date Taking? Authorizing Provider  albuterol (PROVENTIL HFA;VENTOLIN HFA) 108 (90 Base) MCG/ACT inhaler Inhale 2 puffs into the lungs every 4 (four) hours as needed for wheezing or shortness of breath. 05/21/18  Yes Johnson, Clanford L, MD  diazepam (VALIUM) 5 MG tablet Take 1 tablet (5 mg total) by mouth every 6 (six) hours as  needed for anxiety (spasms). 06/02/19  Yes Delo, Nathaneil Canary, MD  lidocaine (LIDODERM) 5 % Place 1 patch onto the skin daily. Remove & Discard patch within 12 hours or as directed by MD Patient taking differently: Place 1 patch onto the skin daily as needed (pain). Remove & Discard patch within 12 hours or as directed by MD 05/31/19  Yes Idol, Almyra Free, PA-C  Multiple Vitamin (MULTIVITAMIN WITH MINERALS) TABS tablet Take 1 tablet by mouth daily. 05/22/18  Yes Johnson, Clanford L, MD  oxyCODONE-acetaminophen (PERCOCET/ROXICET) 5-325 MG tablet Take 1 tablet by mouth every 4 (four) hours as needed. Patient  taking differently: Take 1 tablet by mouth every 4 (four) hours as needed for severe pain.  05/31/19  Yes Idol, Almyra Free, PA-C  predniSONE (DELTASONE) 5 MG tablet Take 5 tablets daily with breakfast for 5 days, then resume 1 tablet daily Patient taking differently: Take 5 mg by mouth daily with breakfast.  05/22/18  Yes Murlean Iba, MD    ALLERGY: No Known Allergies  Social History   Tobacco Use  . Smoking status: Former Smoker    Years: 15.00    Types: Cigarettes, Cigars    Quit date: 10/01/1990    Years since quitting: 28.6  . Smokeless tobacco: Never Used  . Tobacco comment: Quit x 25-30 years; 2 cigars daily when smoked  Substance Use Topics  . Alcohol use: Not Currently    Alcohol/week: 0.0 standard drinks     Family History  Problem Relation Age of Onset  . Kidney disease Mother   . Kidney disease Sister   . Colon cancer Neg Hx   . Gastric cancer Neg Hx   . Esophageal cancer Neg Hx      ROS   Review of Systems  Constitutional: Positive for malaise/fatigue and weight loss.  HENT: Negative.   Eyes: Negative.   Respiratory: Negative.   Cardiovascular: Negative.   Gastrointestinal: Negative for nausea and vomiting.  Genitourinary: Negative.   Musculoskeletal: Positive for back pain and myalgias. Negative for falls.  Skin: Negative.   Neurological: Negative for dizziness, tingling, tremors, sensory change, speech change, focal weakness, seizures, loss of consciousness, weakness and headaches.    Exam   Vitals:   06/06/19 0230 06/06/19 0400  BP: 111/67 116/78  Pulse: 72 70  Resp: 18 14  Temp:    SpO2: 91% 94%   General appearance: elderly male, sitting upright in bed eating breakfast, NAD Eyes: No scleral injection Cardiovascular: Regular rate and rhythm without murmurs, rubs, gallops. No edema or variciosities. Distal pulses normal. Pulmonary: Effort normal, non-labored breathing Musculoskeletal:     Muscle tone upper extremities: Normal    Muscle tone  lower extremities: Normal    Motor exam: Upper Extremities Deltoid Bicep Tricep Grip  Right 5/5 5/5 5/5 5/5  Left 5/5 5/5 5/5 5/5   Lower Extremity IP Quad PF DF EHL  Right 5/5 5/5 5/5 5/5 5/5  Left 5/5 5/5 5/5 5/5 5/5   Neurological Mental Status:    - Patient is awake, alert, oriented to person, place, month, year, and situation    - Patient is able to give a clear and coherent history.    - No signs of aphasia or neglect Cranial Nerves    - II: Visual Fields are full. PERRL    - III/IV/VI: EOMI without ptosis or diploplia.     - V: Facial sensation is grossly normal    - VII: Facial movement is symmetric.     -  VIII: hearing is intact to voice    - X: Uvula elevates symmetrically    - XI: Shoulder shrug is symmetric.    - XII: tongue is midline without atrophy or fasciculations.  Sensory: Sensation grossly intact to LT  Vertebral augmentation incisions: scabbing without evidence of infection.No redness, warmth  No tenderness to palpation throughout thoracic/lumbar spine. He does point to approx T12-L1 (just superior to prior vertebral augmentation site). No reproducible tenderness with palpation.  Results - Imaging/Labs   Results for orders placed or performed during the hospital encounter of 06/05/19 (from the past 48 hour(s))  CBG monitoring, ED     Status: None   Collection Time: 06/05/19  3:25 PM  Result Value Ref Range   Glucose-Capillary 87 70 - 99 mg/dL  Comprehensive metabolic panel     Status: Abnormal   Collection Time: 06/05/19  3:31 PM  Result Value Ref Range   Sodium 140 135 - 145 mmol/L   Potassium 3.7 3.5 - 5.1 mmol/L   Chloride 105 98 - 111 mmol/L   CO2 23 22 - 32 mmol/L   Glucose, Bld 93 70 - 99 mg/dL   BUN 27 (H) 8 - 23 mg/dL   Creatinine, Ser 1.59 (H) 0.61 - 1.24 mg/dL   Calcium 9.6 8.9 - 10.3 mg/dL   Total Protein 6.8 6.5 - 8.1 g/dL   Albumin 4.0 3.5 - 5.0 g/dL   AST 16 15 - 41 U/L   ALT 15 0 - 44 U/L   Alkaline Phosphatase 61 38 - 126 U/L    Total Bilirubin 1.4 (H) 0.3 - 1.2 mg/dL   GFR calc non Af Amer 38 (L) >60 mL/min   GFR calc Af Amer 45 (L) >60 mL/min   Anion gap 12 5 - 15    Comment: Performed at [facility], [address] [place], [place] [address]  CBC     Status: Abnormal   Collection Time: 06/05/19  3:31 PM  Result Value Ref Range   WBC 5.3 4.0 - 10.5 K/uL   RBC 3.71 (L) 4.22 - 5.81 MIL/uL   Hemoglobin 10.6 (L) 13.0 - 17.0 g/dL   HCT 33.8 (L) 39.0 - 52.0 %   MCV 91.1 80.0 - 100.0 fL   MCH 28.6 26.0 - 34.0 pg   MCHC 31.4 30.0 - 36.0 g/dL   RDW 20.5 (H) 11.5 - 15.5 %   Platelets 100 (L) 150 - 400 K/uL    Comment: REPEATED TO VERIFY PLATELET COUNT CONFIRMED BY SMEAR    nRBC 0.0 0.0 - 0.2 %    Comment: Performed at [facility], [place] [address] [place], [place] [address]  SARS CORONAVIRUS 2 (TAT 6-24 HRS) Nasopharyngeal Nasopharyngeal Swab     Status: None   Collection Time: 06/06/19  2:35 AM   Specimen: Nasopharyngeal Swab  Result Value Ref Range   SARS Coronavirus 2 NEGATIVE NEGATIVE    Comment: (NOTE) SARS-CoV-2 target nucleic acids are NOT DETECTED. The SARS-CoV-2 RNA is generally detectable in upper and lower respiratory specimens during the acute phase of infection. Negative results do not preclude SARS-CoV-2 infection, do not rule out co-infections with other pathogens, and should not be used as the sole basis for treatment or other patient management decisions. Negative results must be combined with clinical observations, patient history, and epidemiological information. The expected result is Negative. Fact Sheet for Patients: SugarRoll.be Fact Sheet for Healthcare Providers: https://www.woods-mathews.com/ This test is not yet approved or cleared by the  [place] Architectural technologist and  has been authorized for detection and/or diagnosis of SARS-CoV-2 by FDA under an Print production planner (EUA). This EUA will remain  in effect (meaning this test  can be used) for the duration of the COVID-19 declaration under Section 56 4(b)(1) of the Act, 21 U.S.C. section 360bbb-3(b)(1), unless the authorization is terminated or revoked sooner. Performed at [facility], [place] [address], [place], [place] [address]   Urinalysis, Routine w reflex microscopic     Status: None   Collection Time: 06/06/19  2:40 AM  Result Value Ref Range   Color, Urine YELLOW YELLOW   APPearance CLEAR CLEAR   Specific Gravity, Urine 1.023 1.005 - 1.030   pH 5.0 5.0 - 8.0   Glucose, UA NEGATIVE NEGATIVE mg/dL   Hgb urine dipstick NEGATIVE NEGATIVE   Bilirubin Urine NEGATIVE NEGATIVE   Ketones, ur NEGATIVE NEGATIVE mg/dL   Protein, ur NEGATIVE NEGATIVE mg/dL   Nitrite NEGATIVE NEGATIVE   Leukocytes,Ua NEGATIVE NEGATIVE    Comment: Performed at [facility] [address] [place], [place] [address]  Basic metabolic panel     Status: Abnormal   Collection Time: 06/06/19  4:42 AM  Result Value Ref Range   Sodium 140 135 - 145 mmol/L   Potassium 4.7 3.5 - 5.1 mmol/L   Chloride 106 98 - 111 mmol/L   CO2 26 22 - 32 mmol/L   Glucose, Bld 126 (H) 70 - 99 mg/dL   BUN 27 (H) 8 - 23 mg/dL   Creatinine, Ser 1.36 (H) 0.61 - 1.24 mg/dL   Calcium 9.6 8.9 - 10.3 mg/dL   GFR calc non Af Amer 46 (L) >60 mL/min   GFR calc Af Amer 54 (L) >60 mL/min   Anion gap 8 5 - 15    Comment: Performed at [facility] [address], [place], [place] [address]  CBC     Status: Abnormal   Collection Time: 06/06/19  4:42 AM  Result Value Ref Range   WBC 2.5 (L) 4.0 - 10.5 K/uL   RBC 3.62 (L) 4.22 - 5.81 MIL/uL   Hemoglobin 10.5 (L) 13.0 - 17.0 g/dL   HCT 32.6 (L) 39.0 - 52.0 %   MCV 90.1 80.0 - 100.0 fL   MCH 29.0 26.0 - 34.0 pg   MCHC 32.2 30.0 - 36.0 g/dL   RDW 20.5 (H) 11.5 - 15.5 %   Platelets 95 (L) 150 - 400 K/uL    Comment: Immature Platelet Fraction may be clinically indicated, consider ordering this additional test [id number] CONSISTENT WITH PREVIOUS  RESULT    nRBC 0.0 0.0 - 0.2 %    Comment: Performed at [facility], [place] [address], [place], [place] [address]    DG Lumbar Spine Complete  Result Date: 06/05/2019 CLINICAL DATA:  Back pain. Patient reports low back surgery 1 month ago, pain since that time. EXAM: LUMBAR SPINE - COMPLETE 4+ VIEW COMPARISON:  Lumbar spine CT 06/02/2019, thoracic and lumbar spine radiographs 05/31/2019 FINDINGS: Chronic compression fractures of L2, L3, L4 and L5, unchanged from recent CT. Vertebral augmentation within L2. Compression fracture of T11 and T9 are unchanged from recent radiograph. Bones are diffusely under mineralized. No evidence of acute fracture. Sacroiliac joints are congruent. IMPRESSION: Chronic compression fractures of T9, T11, L2, L3, L4 and L5 from recent imaging. No evidence of acute fracture. Electronically Signed   By: Keith Rake M.D.   On: 06/05/2019 23:28  Impression/Plan   86 y.o. male 1 month s/p L2 vertebral augmentation with recurrent, severe lower back pain that interferes with ADLs since a lifting injury several days ago. He is neurologically intact. Pain has improved by about 50% since being in the hospital.   Imaging reviewed over the past several days including CT L spine, Xray T/L spine. Imaging notable for prior L2 kyphoplasty, chronic compression fractures at T9, T11, L3, L4, L5. I do question possible acute T12 compression fracture when comparing Xrays from yesterday to 1/13 . Given severity of pain, will obtain MRI T and L spine to assess for new compression fracture vs. Acute on chronic fracture. Will f/u after MRI.  Ferne Reus, PA-C [facility] [employer]

## 2019-06-06 NOTE — ED Notes (Signed)
Lunch Tray Ordered @ 1059.  

## 2019-06-06 NOTE — Progress Notes (Signed)
[facility] PROGRESS NOTE  [name] TKW:[id number] DOB: [birth year] DOA: 06/05/2019 PCP: Sharilyn Sites, MD  Assessment/Plan:  #1.  Acute exacerbation of chronic low back pain.  About a month ago patient underwent L2 vertebral augmentation per Dr. Kathyrn Sheriff and was doing well until he went to pick up a 4 foot long 2 x 4.  He reports he felt a "pop" in his back and developed immediate back pain.  He reports his back pain is somewhat improved this morning.  Evaluated by neurosurgery who opine current imaging shows chronic compression fractures and questionable acute T12 compression fracture.  Recommend MRI -Pain management -Follow MRI results -Mobilize per neurosurgery recommendations  #2.  Chronic kidney disease stage III. chart review indicates nephrologist in [place].  Creatinine baseline 1.6-2.0 range.  Currently creatinine is 1.3 -Monitor urine output -Hold nephrotoxins  3.  COPD.  Not on home oxygen.  Home medications include albuterol inhaler.  Stable at baseline  #4.  History of MDS.  Lab work reveals anemia and thrombocytopenia.  Chart review indicates current hemoglobin of 10.5 is close to his baseline.  In addition chart review indicates platelet count of 95 is a little less than baseline. -Monitor   Code Status: full Family Communication: patient Disposition Plan: home when ready. He lives alone. Reports no family nearby.    Consultants:  [place] PA neurosurgery  Procedures:    Antibiotics:    HPI/Subjective: Awake alert reports pain is much better than when he first came.  He was able to eat breakfast.  Objective: Vitals:   06/06/19 0230 06/06/19 0400  BP: 111/67 116/78  Pulse: 72 70  Resp: 18 14  Temp:    SpO2: 91% 94%   No intake or output data in the 24 hours ending 06/06/19 0943 There were no vitals filed for this visit.  Exam:   General: Awake alert somewhat thin and frail-appearing no acute distress  Cardiovascular: Regular  rate and rhythm no murmur gallop or rub no lower extremity edema  Respiratory: Normal effort breath sounds are clear bilaterally I hear no wheezes no rhonchi  Abdomen: Nondistended soft positive bowel sounds throughout no guarding or rebounding  Musculoskeletal: Joints without swelling/erythema  Neuro: Moving all extremities spontaneously bilateral grip 5 out of 5 lower extremity strength 5 out of 5 sensation intact  Data Reviewed: Basic Metabolic Panel: Recent Labs  Lab 06/05/19 1531 06/06/19 0442  NA 140 140  K 3.7 4.7  CL 105 106  CO2 23 26  GLUCOSE 93 126*  BUN 27* 27*  CREATININE 1.59* 1.36*  CALCIUM 9.6 9.6   Liver Function Tests: Recent Labs  Lab 06/05/19 1531  AST 16  ALT 15  ALKPHOS 61  BILITOT 1.4*  PROT 6.8  ALBUMIN 4.0   No results for input(s): LIPASE, AMYLASE in the last 168 hours. No results for input(s): AMMONIA in the last 168 hours. CBC: Recent Labs  Lab 06/05/19 1531 06/06/19 0442  WBC 5.3 2.5*  HGB 10.6* 10.5*  HCT 33.8* 32.6*  MCV 91.1 90.1  PLT 100* 95*   Cardiac Enzymes: No results for input(s): CKTOTAL, CKMB, CKMBINDEX, TROPONINI in the last 168 hours. BNP (last 3 results) No results for input(s): BNP in the last 8760 hours.  ProBNP (last 3 results) No results for input(s): PROBNP in the last 8760 hours.  CBG: Recent Labs  Lab 06/05/19 1525  GLUCAP 87    Recent Results (from the past 240 hour(s))  SARS CORONAVIRUS 2 (TAT 6-24 HRS) Nasopharyngeal Nasopharyngeal  Swab     Status: None   Collection Time: 06/06/19  2:35 AM   Specimen: Nasopharyngeal Swab  Result Value Ref Range Status   SARS Coronavirus 2 NEGATIVE NEGATIVE Final    Comment: (NOTE) SARS-CoV-2 target nucleic acids are NOT DETECTED. The SARS-CoV-2 RNA is generally detectable in upper and lower respiratory specimens during the acute phase of infection. Negative results do not preclude SARS-CoV-2 infection, do not rule out co-infections with other pathogens, and  should not be used as the sole basis for treatment or other patient management decisions. Negative results must be combined with clinical observations, patient history, and epidemiological information. The expected result is Negative. Fact Sheet for Patients: SugarRoll.be Fact Sheet for Healthcare Providers: https://www.woods-mathews.com/ This test is not yet approved or cleared by the [place] FDA and  has been authorized for detection and/or diagnosis of SARS-CoV-2 by FDA under an Emergency Use Authorization (EUA). This EUA will remain  in effect (meaning this test can be used) for the duration of the COVID-19 declaration under Section 56 4(b)(1) of the Act, 21 U.S.C. section 360bbb-3(b)(1), unless the authorization is terminated or revoked sooner. Performed at [facility], [place] [address] [place], [place] [address]      Studies: DG Lumbar Spine Complete  Result Date: 06/05/2019 CLINICAL DATA:  Back pain. Patient reports low back surgery 1 month ago, pain since that time. EXAM: LUMBAR SPINE - COMPLETE 4+ VIEW COMPARISON:  Lumbar spine CT 06/02/2019, thoracic and lumbar spine radiographs 05/31/2019 FINDINGS: Chronic compression fractures of L2, L3, L4 and L5, unchanged from recent CT. Vertebral augmentation within L2. Compression fracture of T11 and T9 are unchanged from recent radiograph. Bones are diffusely under mineralized. No evidence of acute fracture. Sacroiliac joints are congruent. IMPRESSION: Chronic compression fractures of T9, T11, L2, L3, L4 and L5 from recent imaging. No evidence of acute fracture. Electronically Signed   By: Keith Rake M.D.   On: 06/05/2019 23:28    Scheduled Meds: .  HYDROmorphone (DILAUDID) injection  0.5 mg Intravenous Once  . senna  1 tablet Oral Daily  . sodium chloride flush  3 mL Intravenous Once   Continuous Infusions:  Principal Problem:   Acute exacerbation of chronic low back  pain Active Problems:   Chronic kidney insufficiency   GERD (gastroesophageal reflux disease)   Anemia   Pulmonary nodule   Thrombocytopenia ([facility])    Time spent: 50 minutes    [place] NP  [facility]  If 7PM-7AM, please contact night-coverage at [email or web address], password [facility] 06/06/2019, 9:43 AM  LOS: 0 days

## 2019-06-06 NOTE — ED Notes (Signed)
Pt resting on cart in NAD. Breathing easy, non-labored. Equal rise and fall of chest noted. VSS on monitors. Hourly rounds completed. Denies any needs at this time. Will continue to monitor.

## 2019-06-06 NOTE — ED Notes (Signed)
Assumed care of pt. Pt alert, resting on cart in NAD. Breathing easy, non-labored. Speaking in full sentences. Call light within reach. Hourly rounds completed.

## 2019-06-06 NOTE — H&P (Signed)
History and Physical    [name] FTD:[id number] DOB: [birth year] DOA: 06/05/2019  PCP: Sharilyn Sites, MD  Patient coming from: Home.  Chief Complaint: Low back pain.  HPI: [name] is a 86 y.o. male with history of COPD, MDS was recently admitted 3 weeks ago for COPD/bronchiectasis exacerbation presents to the ER with worsening low back pain for the third time in last 1 week.  Patient's pain started about a week ago which has been constant with no radiation mostly in the low back.  Has had kyphoplasty previously by Dr. Kathyrn Sheriff neurosurgeon.  Denies any incontinence of urine bowel fever chills or any weakness of the extremities.  Pain does not radiate to the lower extremities.  ED Course: In the ER patient had x-rays which did not show any acute fractures.  As a CT lumbar spine during last visit few days ago which also did not show any acute fractures.  Does have chronic lumbar and thoracic fractures.  Patient has benign persistent pain despite multiple dose of pain relief medication and has been admitted for further management.  ER physician has discussed with Dr. Venetia Constable on-call neurosurgery will be seeing patient in consult.  Review of Systems: As per HPI, rest all negative.   Past Medical History:  Diagnosis Date  . Bone cancer ([place])   . Chronic kidney insufficiency     Past Surgical History:  Procedure Laterality Date  . BACK SURGERY    . CATARACT EXTRACTION W/PHACO Right 03/09/2016   Procedure: CATARACT EXTRACTION PHACO AND INTRAOCULAR LENS PLACEMENT RIGHT EYE CDE=8.58;  Surgeon: Tonny Branch, MD;  Location: [facility];  Service: Ophthalmology;  Laterality: Right;  right  . CATARACT EXTRACTION W/PHACO Left 04/13/2016   Procedure: CATARACT EXTRACTION PHACO AND INTRAOCULAR LENS PLACEMENT (IOC);  Surgeon: Tonny Branch, MD;  Location: [facility];  Service: Ophthalmology;  Laterality: Left;  CDE: 8.08  . None to Date  10/01/15     reports that he quit smoking about 28 years  ago. His smoking use included cigarettes and cigars. He quit after 15.00 years of use. He has never used smokeless tobacco. He reports previous alcohol use. He reports that he does not use drugs.  No Known Allergies  Family History  Problem Relation Age of Onset  . Kidney disease Mother   . Kidney disease Sister   . Colon cancer Neg Hx   . Gastric cancer Neg Hx   . Esophageal cancer Neg Hx     Prior to Admission medications   Medication Sig Start Date End Date Taking? Authorizing Provider  albuterol (PROVENTIL HFA;VENTOLIN HFA) 108 (90 Base) MCG/ACT inhaler Inhale 2 puffs into the lungs every 4 (four) hours as needed for wheezing or shortness of breath. 05/21/18  Yes Johnson, Clanford L, MD  diazepam (VALIUM) 5 MG tablet Take 1 tablet (5 mg total) by mouth every 6 (six) hours as needed for anxiety (spasms). 06/02/19  Yes Delo, Nathaneil Canary, MD  lidocaine (LIDODERM) 5 % Place 1 patch onto the skin daily. Remove & Discard patch within 12 hours or as directed by MD Patient taking differently: Place 1 patch onto the skin daily as needed (pain). Remove & Discard patch within 12 hours or as directed by MD 05/31/19  Yes Idol, Almyra Free, PA-C  Multiple Vitamin (MULTIVITAMIN WITH MINERALS) TABS tablet Take 1 tablet by mouth daily. 05/22/18  Yes Johnson, Clanford L, MD  oxyCODONE-acetaminophen (PERCOCET/ROXICET) 5-325 MG tablet Take 1 tablet by mouth every 4 (four) hours as needed. Patient  taking differently: Take 1 tablet by mouth every 4 (four) hours as needed for severe pain.  05/31/19  Yes Idol, Almyra Free, PA-C  predniSONE (DELTASONE) 5 MG tablet Take 5 tablets daily with breakfast for 5 days, then resume 1 tablet daily Patient taking differently: Take 5 mg by mouth daily with breakfast.  05/22/18  Yes Murlean Iba, MD    Physical Exam: Constitutional: Moderately built and nourished. Vitals:   06/05/19 1528 06/05/19 2148 06/05/19 2245 06/05/19 2330  BP: 113/68 (!) 145/91 110/71 123/77  Pulse: 95 77 77     Resp: 14 20 15  (!) 22  Temp: 97.7 F (36.5 C)     TempSrc: Oral     SpO2: 96% 95% 93%    Eyes: Anicteric no pallor. ENMT: No discharge from the ears eyes nose or mouth. Neck: No mass felt.  No neck rigidity. Respiratory: No rhonchi or crepitations. Cardiovascular: S1-S2 heard. Abdomen: Soft nontender bowel sounds present. Musculoskeletal: No edema.  No pain on straight leg raising. Skin: No rash. Neurologic: Alert awake oriented to time place and person.  Moves all extremities. Psychiatric: Appears normal with normal affect.   Labs on Admission: I have personally reviewed following labs and imaging studies  CBC: Recent Labs  Lab 06/05/19 1531  WBC 5.3  HGB 10.6*  HCT 33.8*  MCV 91.1  PLT 341*   Basic Metabolic Panel: Recent Labs  Lab 06/05/19 1531  NA 140  K 3.7  CL 105  CO2 23  GLUCOSE 93  BUN 27*  CREATININE 1.59*  CALCIUM 9.6   GFR: Estimated Creatinine Clearance: 29.4 mL/min (A) (by C-G formula based on SCr of 1.59 mg/dL (H)). Liver Function Tests: Recent Labs  Lab 06/05/19 1531  AST 16  ALT 15  ALKPHOS 61  BILITOT 1.4*  PROT 6.8  ALBUMIN 4.0   No results for input(s): LIPASE, AMYLASE in the last 168 hours. No results for input(s): AMMONIA in the last 168 hours. Coagulation Profile: No results for input(s): INR, PROTIME in the last 168 hours. Cardiac Enzymes: No results for input(s): CKTOTAL, CKMB, CKMBINDEX, TROPONINI in the last 168 hours. BNP (last 3 results) No results for input(s): PROBNP in the last 8760 hours. HbA1C: No results for input(s): HGBA1C in the last 72 hours. CBG: Recent Labs  Lab 06/05/19 1525  GLUCAP 87   Lipid Profile: No results for input(s): CHOL, HDL, LDLCALC, TRIG, CHOLHDL, LDLDIRECT in the last 72 hours. Thyroid Function Tests: No results for input(s): TSH, T4TOTAL, FREET4, T3FREE, THYROIDAB in the last 72 hours. Anemia Panel: No results for input(s): VITAMINB12, FOLATE, FERRITIN, TIBC, IRON, RETICCTPCT in  the last 72 hours. Urine analysis:    Component Value Date/Time   COLORURINE YELLOW 05/20/2018 0037   APPEARANCEUR CLEAR 05/20/2018 0037   LABSPEC >1.046 (H) 05/20/2018 0037   PHURINE 5.0 05/20/2018 0037   GLUCOSEU NEGATIVE 05/20/2018 0037   HGBUR SMALL (A) 05/20/2018 0037   BILIRUBINUR NEGATIVE 05/20/2018 0037   KETONESUR 20 (A) 05/20/2018 0037   PROTEINUR NEGATIVE 05/20/2018 0037   UROBILINOGEN 0.2 08/15/2013 1240   NITRITE NEGATIVE 05/20/2018 0037   LEUKOCYTESUR TRACE (A) 05/20/2018 0037   Sepsis Labs: @LABRCNTIP (procalcitonin:4,lacticidven:4) )No results found for this or any previous visit (from the past 240 hour(s)).   Radiological Exams on Admission: DG Lumbar Spine Complete  Result Date: 06/05/2019 CLINICAL DATA:  Back pain. Patient reports low back surgery 1 month ago, pain since that time. EXAM: LUMBAR SPINE - COMPLETE 4+ VIEW COMPARISON:  Lumbar spine CT  06/02/2019, thoracic and lumbar spine radiographs 05/31/2019 FINDINGS: Chronic compression fractures of L2, L3, L4 and L5, unchanged from recent CT. Vertebral augmentation within L2. Compression fracture of T11 and T9 are unchanged from recent radiograph. Bones are diffusely under mineralized. No evidence of acute fracture. Sacroiliac joints are congruent. IMPRESSION: Chronic compression fractures of T9, T11, L2, L3, L4 and L5 from recent imaging. No evidence of acute fracture. Electronically Signed   By: Keith Rake M.D.   On: 06/05/2019 23:28      Assessment/Plan Principal Problem:   Acute exacerbation of chronic low back pain Active Problems:   Anemia   Thrombocytopenia (HCC)    1. Low back pain -neurosurgeon Dr. Venetia Constable has been consulted we will keep patient on pain relief medication and await further recommendation per neurosurgery. 2. History of MDS with lab work showing anemia and thrombocytopenia.  Patient follows up with oncologist in [place].  Follow CBC closely. 3. Chronic kidney disease stage  III creatinine appears to be at around the baseline.  Closely monitor.  Avoid any NSAIDs or nephrotoxic's. 4. COPD not actively wheezing.   DVT prophylaxis: SCDs for now. Code Status: Full code. Family Communication: Patient's daughter. Disposition Plan: To be determined. Consults called: Neurosurgery. Admission status: Observation.   Rise Patience MD [facility] Pager [phone].  If 7PM-7AM, please contact night-coverage [email or web address] Password [facility]  06/06/2019, 12:41 AM

## 2019-06-06 NOTE — Progress Notes (Signed)
Attempted MRI with meds x 2. Pt unable to tolerate lying flat enough for the exam due to pain. Tried measures to assist with comfort. Patient sent back and RN contacted.

## 2019-06-06 NOTE — ED Notes (Signed)
Labs collected by phlebotomy  

## 2019-06-06 NOTE — ED Notes (Signed)
Pt back from MRI was unable to lay flat for the MRI

## 2019-06-06 NOTE — ED Notes (Signed)
RN attempted to call report to 3W

## 2019-06-07 ENCOUNTER — Inpatient Hospital Stay (HOSPITAL_COMMUNITY): Payer: Medicare Other

## 2019-06-07 DIAGNOSIS — M549 Dorsalgia, unspecified: Secondary | ICD-10-CM

## 2019-06-07 DIAGNOSIS — S32000D Wedge compression fracture of unspecified lumbar vertebra, subsequent encounter for fracture with routine healing: Secondary | ICD-10-CM

## 2019-06-07 LAB — BASIC METABOLIC PANEL
Anion gap: 11 (ref 5–15)
BUN: 28 mg/dL — ABNORMAL HIGH (ref 8–23)
CO2: 23 mmol/L (ref 22–32)
Calcium: 9.2 mg/dL (ref 8.9–10.3)
Chloride: 106 mmol/L (ref 98–111)
Creatinine, Ser: 1.59 mg/dL — ABNORMAL HIGH (ref 0.61–1.24)
GFR calc Af Amer: 45 mL/min — ABNORMAL LOW (ref >60)
GFR calc non Af Amer: 38 mL/min — ABNORMAL LOW (ref >60)
Glucose, Bld: 92 mg/dL (ref 70–99)
Potassium: 3.8 mmol/L (ref 3.5–5.1)
Sodium: 140 mmol/L (ref 135–145)

## 2019-06-07 LAB — CBC
HCT: 30.7 % — ABNORMAL LOW (ref 39.0–52.0)
Hemoglobin: 9.8 g/dL — ABNORMAL LOW (ref 13.0–17.0)
MCH: 28.7 pg (ref 26.0–34.0)
MCHC: 31.9 g/dL (ref 30.0–36.0)
MCV: 90 fL (ref 80.0–100.0)
Platelets: 90 10*3/uL — ABNORMAL LOW (ref 150–400)
RBC: 3.41 MIL/uL — ABNORMAL LOW (ref 4.22–5.81)
RDW: 20.5 % — ABNORMAL HIGH (ref 11.5–15.5)
WBC: 3.5 10*3/uL — ABNORMAL LOW (ref 4.0–10.5)
nRBC: 0 % (ref 0.0–0.2)

## 2019-06-07 MED ORDER — HYDROMORPHONE HCL 1 MG/ML IJ SOLN
1.0000 mg | Freq: Once | INTRAMUSCULAR | Status: AC
  Administered 2019-06-07: 1 mg via INTRAVENOUS
  Filled 2019-06-07: qty 2

## 2019-06-07 NOTE — Progress Notes (Signed)
Orthopedic Tech Progress Note Patient Details:  [name] 28-Feb-1930 [id number]  Patient ID: [name], male   DOB: 1929-06-15, 86 y.o.   MRN: [id number] Called in order to Early 06/07/2019, 1:40 PM

## 2019-06-07 NOTE — Progress Notes (Signed)
  NEUROSURGERY PROGRESS NOTE   Unable to complete MRI yesterday due to severe LBP Pain is a 7/10 today. Difficult to find a comfortable position Denies radicular sx, weakness in legs, bowel/bladder dysfunction.  EXAM:  BP 117/70 (BP Location: Right Arm)   Pulse 70   Temp 98.1 F (36.7 C) (Oral)   Resp 17   SpO2 97%   Awake, alert, oriented  Speech fluent, appropriate  CN grossly intact  5/5 BUE/BLE   IMPRESSION/PLAN 86 y.o. male 1 month s/p L2 vertebral augmentation with recurrent, severe lower back pain that interferes with ADLs since a lifting injury several days ago. He is neurologically intact. Imaging of lumbar and thoracic spine shows multiple chronic compression fractures in T and L spine. Still awaiting MRI T and L spine to further evaluate symptoms. I have ordered a TLSO brace for the patient. Okay to ambulate & work with PT/OT with TLSO.

## 2019-06-07 NOTE — Consult Note (Signed)
   [facility] Inpatient Consult   06/07/2019  [name] 06-26-1929 [id number]   Patient is currently active with [facility] for chronic disease management services.  Patient has been engaged by a [place].  Our community based plan of care has focused on disease management. Patient assessed from progress notes which includes but not limited to for admission for lower back pain with chronic kidney disease stage III. Primary Care Provider:  Dr. Sharilyn Sites, [facility], this office is listed to provide the transition of care follow up calls.   Plan: Is to follow up  Inpatient Transition Of Care [TOC] team member to make aware that [facility] following.  Will update patient's [place] for \\assign  post hospital transition follow up needs in the Medicare NextGen ACO at disposition/transition as appropriate.  Of note, [facility] Care Management services does not replace or interfere with any services that are needed or arranged by inpatient [facility] care management team.  For additional questions or referrals please contact:  Natividad Brood, RN BSN [facility] Liaison  [phone] business mobile phone Toll free office [phone]  Fax number: [phone] [email or web address]

## 2019-06-07 NOTE — Progress Notes (Signed)
[facility] PROGRESS NOTE  [name] RWE:[id number] DOB: 06/04/1929 DOA: 06/05/2019 PCP: Sharilyn Sites, MD    [name] is a 86 y.o. male with history of COPD, MDS was recently admitted 3 weeks ago for COPD/bronchiectasis exacerbation presents to the ER with worsening low back pain for the third time in last 1 week.  Patient's pain started about a week ago which has been constant with no radiation mostly in the low back.  Has had kyphoplasty previously by Dr. Kathyrn Sheriff neurosurgeon.  MRI pending, has been difficult to get due to patient's uncontrolled pain.  Assessment/Plan:  #1.  Acute exacerbation of chronic low back pain.  About a month ago patient underwent L2 vertebral augmentation per Dr. Kathyrn Sheriff and was doing well until he went to pick up a 4 foot long 2 x 4.  He reports he felt a "pop" in his back and developed immediate back pain.   - Evaluated by neurosurgery who opine current imaging shows chronic compression fractures and questionable acute T12 compression fracture.  Recommend MRI--unable to get MRI due to inability to lie flat I have changed patient's pain medication to scheduled Tylenol, scheduled Robaxin, as needed oral Aloxi and give a one-time dose of IV Dilaudid prior to MRI to see if we can have him lay flat -brace -PT/OT   #2.  Chronic kidney disease stage IIIa. chart review indicates nephrologist in [place].  Creatinine baseline 1.6-2.0 range.    #3.  COPD.  Not on home oxygen.  Home medications include albuterol inhaler.  Stable at baseline  #4.  History of MDS.  Lab work reveals anemia and thrombocytopenia.  Chart review indicates current hemoglobin of 10.5 is close to his baseline.  In addition chart review indicates platelet count of 95 is a little less than baseline. -Monitor   Code Status: full Family Communication: patient Disposition Plan: MRI pending, needs pain control and ambulation   Consultants:  Costella PA  neurosurgery    HPI/Subjective: Pain is still unbearable  Objective: Vitals:   06/07/19 0354 06/07/19 0853  BP: 117/70 125/68  Pulse: 70 81  Resp: 17 (!) 21  Temp: 98.1 F (36.7 C) 97.9 F (36.6 C)  SpO2: 97% 98%    Intake/Output Summary (Last 24 hours) at 06/07/2019 1151 Last data filed at 06/07/2019 0855 Gross per 24 hour  Intake 350 ml  Output --  Net 350 ml   There were no vitals filed for this visit.  Exam:   In bed, NAD  rrr  No increased work of breathing  Moves all 4 ext  Data Reviewed: Basic Metabolic Panel: Recent Labs  Lab 06/05/19 1531 06/06/19 0442 06/07/19 0621  NA 140 140 140  K 3.7 4.7 3.8  CL 105 106 106  CO2 23 26 23   GLUCOSE 93 126* 92  BUN 27* 27* 28*  CREATININE 1.59* 1.36* 1.59*  CALCIUM 9.6 9.6 9.2   Liver Function Tests: Recent Labs  Lab 06/05/19 1531  AST 16  ALT 15  ALKPHOS 61  BILITOT 1.4*  PROT 6.8  ALBUMIN 4.0   No results for input(s): LIPASE, AMYLASE in the last 168 hours. No results for input(s): AMMONIA in the last 168 hours. CBC: Recent Labs  Lab 06/05/19 1531 06/06/19 0442 06/07/19 0621  WBC 5.3 2.5* 3.5*  HGB 10.6* 10.5* 9.8*  HCT 33.8* 32.6* 30.7*  MCV 91.1 90.1 90.0  PLT 100* 95* 90*   Cardiac Enzymes: No results for input(s): CKTOTAL, CKMB, CKMBINDEX, TROPONINI in the  last 168 hours. BNP (last 3 results) No results for input(s): BNP in the last 8760 hours.  ProBNP (last 3 results) No results for input(s): PROBNP in the last 8760 hours.  CBG: Recent Labs  Lab 06/05/19 1525  GLUCAP 87    Recent Results (from the past 240 hour(s))  SARS CORONAVIRUS 2 (TAT 6-24 HRS) Nasopharyngeal Nasopharyngeal Swab     Status: None   Collection Time: 06/06/19  2:35 AM   Specimen: Nasopharyngeal Swab  Result Value Ref Range Status   SARS Coronavirus 2 NEGATIVE NEGATIVE Final    Comment: (NOTE) SARS-CoV-2 target nucleic acids are NOT DETECTED. The SARS-CoV-2 RNA is generally detectable in upper and  lower respiratory specimens during the acute phase of infection. Negative results do not preclude SARS-CoV-2 infection, do not rule out co-infections with other pathogens, and should not be used as the sole basis for treatment or other patient management decisions. Negative results must be combined with clinical observations, patient history, and epidemiological information. The expected result is Negative. Fact Sheet for Patients: SugarRoll.be Fact Sheet for Healthcare Providers: https://www.woods-mathews.com/ This test is not yet approved or cleared by the [place] FDA and  has been authorized for detection and/or diagnosis of SARS-CoV-2 by FDA under an Emergency Use Authorization (EUA). This EUA will remain  in effect (meaning this test can be used) for the duration of the COVID-19 declaration under Section 56 4(b)(1) of the Act, 21 U.S.C. section 360bbb-3(b)(1), unless the authorization is terminated or revoked sooner. Performed at [facility], [place] [address], [place], [place] [address]      Studies: DG Lumbar Spine Complete  Result Date: 06/05/2019 CLINICAL DATA:  Back pain. Patient reports low back surgery 1 month ago, pain since that time. EXAM: LUMBAR SPINE - COMPLETE 4+ VIEW COMPARISON:  Lumbar spine CT 06/02/2019, thoracic and lumbar spine radiographs 05/31/2019 FINDINGS: Chronic compression fractures of L2, L3, L4 and L5, unchanged from recent CT. Vertebral augmentation within L2. Compression fracture of T11 and T9 are unchanged from recent radiograph. Bones are diffusely under mineralized. No evidence of acute fracture. Sacroiliac joints are congruent. IMPRESSION: Chronic compression fractures of T9, T11, L2, L3, L4 and L5 from recent imaging. No evidence of acute fracture. Electronically Signed   By: Keith Rake M.D.   On: 06/05/2019 23:28    Scheduled Meds: . acetaminophen  1,000 mg Oral TID  .  HYDROmorphone  (DILAUDID) injection  1-2 mg Intravenous Once  . methocarbamol  500 mg Oral QID  . senna  1 tablet Oral Daily  . sodium chloride flush  3 mL Intravenous Once   Continuous Infusions:  Principal Problem:   Acute exacerbation of chronic low back pain Active Problems:   GERD (gastroesophageal reflux disease)   Anemia   Pulmonary nodule   Thrombocytopenia (HCC)   Chronic kidney insufficiency   Intractable back pain    Time spent: 45 minutes    [facility]  If 7PM-7AM, please contact night-coverage at [email or web address], password [facility] 06/07/2019, 11:51 AM  LOS: 1 day

## 2019-06-08 DIAGNOSIS — K219 Gastro-esophageal reflux disease without esophagitis: Secondary | ICD-10-CM

## 2019-06-08 DIAGNOSIS — D61818 Other pancytopenia: Secondary | ICD-10-CM

## 2019-06-08 DIAGNOSIS — R911 Solitary pulmonary nodule: Secondary | ICD-10-CM

## 2019-06-08 DIAGNOSIS — D649 Anemia, unspecified: Secondary | ICD-10-CM

## 2019-06-08 DIAGNOSIS — D469 Myelodysplastic syndrome, unspecified: Secondary | ICD-10-CM

## 2019-06-08 DIAGNOSIS — D696 Thrombocytopenia, unspecified: Secondary | ICD-10-CM

## 2019-06-08 DIAGNOSIS — N1832 Chronic kidney disease, stage 3b: Secondary | ICD-10-CM

## 2019-06-08 MED ORDER — LACTATED RINGERS IV BOLUS
500.0000 mL | Freq: Once | INTRAVENOUS | Status: AC
  Administered 2019-06-08: 500 mL via INTRAVENOUS

## 2019-06-08 NOTE — Progress Notes (Signed)
  NEUROSURGERY PROGRESS NOTE   Fairly significant pain since MRI yesterday No N/T/W in extremities  EXAM:  BP 99/61 (BP Location: Right Arm)   Pulse 72   Temp 98.4 F (36.9 C) (Oral)   Resp 16   SpO2 94%   Awake, alert, oriented  Speech fluent, appropriate  CN grossly intact  5/5 BUE/BLE   IMPRESSION/PLAN 86 y.o. male 1 month s/p L2 vertebral augmentation with recurrent, severe lower back pain that interferes with ADLs since a lifting injury several days ago. He is neurologically intact. MRI T and L spine reviewed. Primary findings at T11 where there is an acute compression fracture. - Plan for kyphoplasty tomorrow afternoon - NPO at midnight - D/C all blood thinning agents

## 2019-06-08 NOTE — Consult Note (Signed)
   [facility] CM Inpatient Consult   06/08/2019  [place] 1929/09/20 [id number]   Follow up:  Spoke with the patient via hospital bedside phone, HIPAA verified. Patient states he is feeling a "little" better, today.  Patient agrees to ongoing follow up by the [place].  Patient denies issues with obtaining food resources, transportation [was driving prior to and no needs], or pharmacy.   Encouraged patient to update [facility] RN Care Coordinator of any changes or needs.  He verbalized understanding.  For questions, please contact:  Natividad Brood, RN BSN [facility] Liaison  [phone] business mobile phone Toll free office [phone]  Fax number: [phone] [email or web address]

## 2019-06-08 NOTE — Evaluation (Signed)
Physical Therapy Evaluation Patient Details Name: [name] MRN: [id number] DOB: [birth year] Today's Date: 06/08/2019   History of Present Illness  86 y.o. male 1 month s/p L2 vertebral augmentation with recurrent, severe lower back pain that interferes with ADLs since a lifting injury several days ago. He is neurologically intact. MRI T and L spine reviewed. Primary findings at T11 where there is an acute compression fracture.  Clinical Impression  Pt admitted with above. Presents with decreased functional mobility secondary to pain, decreased activity tolerance, abnormal posture. Ambulating 200 feet with a walker at a min guard assist level. Education provided regarding brace use, spinal precautions in regards to ADL's, activity recommendations, and assist for IADL's at home. Would benefit from HHPT at discharge to maximize functional independence.     Follow Up Recommendations Home health PT;Supervision - Intermittent    Equipment Recommendations  Rolling walker with 5" wheels    Recommendations for Other Services       Precautions / Restrictions Precautions Precautions: Back;Fall Precaution Booklet Issued: No Precaution Comments: reviewed precautions Required Braces or Orthoses: Spinal Brace Spinal Brace: Thoracolumbosacral orthotic;Applied in sitting position Restrictions Weight Bearing Restrictions: No      Mobility  Bed Mobility Overal bed mobility: Needs Assistance Bed Mobility: Supine to Sit;Sit to Supine     Supine to sit: Min guard;HOB elevated Sit to supine: Min guard;HOB elevated   General bed mobility comments: Pt reports unable to lay flat in bed due to pain.  Cues for log rolling technique in/out of bed.  Transfers Overall transfer level: Needs assistance Equipment used: Rolling walker (2 wheeled) Transfers: Sit to/from Stand Sit to Stand: Min guard            Ambulation/Gait Ambulation/Gait assistance: Min guard Gait Distance (Feet): 200  Feet Assistive device: Rolling walker (2 wheeled) Gait Pattern/deviations: Step-through pattern;Decreased stride length;Trunk flexed Gait velocity: decreased   General Gait Details: Pt with light reliance through hands on walker, slow and steady pace, kyphotic posture, min guard for safety  Stairs            Wheelchair Mobility    Modified Rankin (Stroke Patients Only)       Balance Overall balance assessment: Mild deficits observed, not formally tested                                           Pertinent Vitals/Pain Pain Assessment: 0-10 Pain Score: 5  Pain Location: 5 at rest in low back, increase during end of activity Pain Descriptors / Indicators: Burning;Other (Comment)("raw," "knife") Pain Intervention(s): Limited activity within patient's tolerance;Monitored during session;Premedicated before session;Repositioned    Home Living Family/patient expects to be discharged to:: Private residence Living Arrangements: Alone Available Help at Discharge: Neighbor Type of Home: House Home Access: Stairs to enter   [employer] of Steps: 3 Home Layout: One level Home Equipment: Grab bars - tub/shower;Shower seat;Cane - single point      Prior Function Level of Independence: Independent         Comments: driving, grocery shopping     Hand Dominance   Dominant Hand: Right    Extremity/Trunk Assessment   Upper Extremity Assessment Upper Extremity Assessment: Overall WFL for tasks assessed    Lower Extremity Assessment Lower Extremity Assessment: Overall WFL for tasks assessed    Cervical / Trunk Assessment Cervical / Trunk Assessment: Kyphotic  Communication  Communication: No difficulties  Cognition Arousal/Alertness: Awake/alert Behavior During Therapy: WFL for tasks assessed/performed Overall Cognitive Status: Within Functional Limits for tasks assessed                                        General  Comments      Exercises     Assessment/Plan    PT Assessment Patient needs continued PT services  PT Problem List Decreased strength;Decreased activity tolerance;Decreased balance;Decreased mobility;Pain       PT Treatment Interventions DME instruction;Stair training;Gait training;Functional mobility training;Therapeutic activities;Therapeutic exercise;Balance training;Patient/family education    PT Goals (Current goals can be found in the Care Plan section)  Acute Rehab PT Goals Patient Stated Goal: to have less pain PT Goal Formulation: With patient Time For Goal Achievement: 06/22/19 Potential to Achieve Goals: Good    Frequency Min 3X/week   Barriers to discharge Decreased caregiver support      Co-evaluation PT/OT/SLP Co-Evaluation/Treatment: Yes Reason for Co-Treatment: To address functional/ADL transfers PT goals addressed during session: Mobility/safety with mobility OT goals addressed during session: ADL's and self-care       AM-PAC PT "6 Clicks" Mobility  Outcome Measure Help needed turning from your back to your side while in a flat bed without using bedrails?: None Help needed moving from lying on your back to sitting on the side of a flat bed without using bedrails?: A Little Help needed moving to and from a bed to a chair (including a wheelchair)?: A Little Help needed standing up from a chair using your arms (e.g., wheelchair or bedside chair)?: A Little Help needed to walk in hospital room?: A Little Help needed climbing 3-5 steps with a railing? : A Lot 6 Click Score: 18    End of Session Equipment Utilized During Treatment: Gait belt;Back brace Activity Tolerance: Patient limited by pain Patient left: in bed;with call bell/phone within reach Nurse Communication: Mobility status PT Visit Diagnosis: Pain;Difficulty in walking, not elsewhere classified (R26.2) Pain - part of body: (back)    Time: 8309-4076 PT Time Calculation (min) (ACUTE ONLY):  34 min   Charges:   PT Evaluation $PT Eval Moderate Complexity: 1 Mod          Ellamae Sia, PT, DPT Acute Rehabilitation Services Pager [phone] Office [phone]   Willy Eddy 06/08/2019, 11:02 AM

## 2019-06-08 NOTE — Progress Notes (Signed)
Paged on call about patient having a low bp (map [phone]); awaiting orders

## 2019-06-08 NOTE — Evaluation (Signed)
Occupational Therapy Evaluation Patient Details Name: [name] MRN: [id number] DOB: 06/20/1929 Today's Date: 06/08/2019    History of Present Illness 86 y.o. male 1 month s/p L2 vertebral augmentation with recurrent, severe lower back pain that interferes with ADLs since a lifting injury several days ago. He is neurologically intact. MRI T and L spine reviewed. Primary findings at T11 where there is an acute compression fracture.   Clinical Impression   Pt admitted with above and presents to OT with impairments impacting ability to complete ADLs at [facility].  Pt required Min assist for sit > stand and min guard with mobility with AD.  Pt reports having a cane at home and having used a RW previously but not using them PTA.  Completed toileting needs at sit >stand level with min cues for adherence to back precautions.  Provided education on back precautions throughout session and modified techniques to decrease pain.  Pt verbalizing concerns about what the medical plan may be and voicing discomfort with TLSO.  Pt also concerned about IADLs, encouraged pt to utilize family and friends to assist with IADLs.  Pt will continue to benefit from OT acutely to increase independence with ADLs and IADLs prior to d/c home with HHOT and intermittent supervision.    Follow Up Recommendations  Home health OT;Supervision - Intermittent    Equipment Recommendations  None recommended by OT       Precautions / Restrictions Precautions Precautions: Back;Fall Precaution Comments: reviewed precautions Required Braces or Orthoses: Spinal Brace Spinal Brace: Thoracolumbosacral orthotic;Applied in sitting position Restrictions Weight Bearing Restrictions: No      Mobility Bed Mobility Overal bed mobility: Needs Assistance Bed Mobility: Supine to Sit;Sit to Supine     Supine to sit: Min guard;HOB elevated Sit to supine: Min guard;HOB elevated   General bed mobility comments: Pt reports unable to lay  flat in bed due to pain.  Cues for log rolling technique in/out of bed.  Transfers Overall transfer level: Needs assistance Equipment used: Rolling walker (2 wheeled) Transfers: Sit to/from Stand Sit to Stand: Min guard                  ADL either performed or assessed with clinical judgement   ADL Overall ADL's : Needs assistance/impaired     Grooming: Wash/dry hands;Wash/dry face;Oral care;Supervision/safety;Min guard;Standing Grooming Details (indicate cue type and reason): Intermittent min guard to supervision during standing.  Providing cues for adherence to back precautions to decrease bending while at sink Upper Body Bathing: Supervision/ safety;Sitting   Lower Body Bathing: Min guard;Sit to/from stand   Upper Body Dressing : Supervision/safety;Sitting   Lower Body Dressing: Min guard;Sit to/from stand   Toilet Transfer: Min guard;RW;Cueing for Office manager Details (indicate cue type and reason): Cues for safety and adherence to back precautions during mobility Toileting- Clothing Manipulation and Hygiene: Supervision/safety       Functional mobility during ADLs: Min guard;Rolling walker General ADL Comments: Min guard with RW with mobility in room, completing sit > stand and hygiene from toilet with supervision.  Pt reports increased pain with ambulation with TLSO donned.     Vision Baseline Vision/History: Wears glasses Wears Glasses: At all times Patient Visual Report: No change from baseline Vision Assessment?: No apparent visual deficits            Pertinent Vitals/Pain Pain Assessment: 0-10 Pain Score: 5  Pain Location: low back Pain Descriptors / Indicators: Burning;Other (Comment)("raw")     Hand Dominance Right  Extremity/Trunk Assessment Upper Extremity Assessment Upper Extremity Assessment: Overall WFL for tasks assessed   Lower Extremity Assessment Lower Extremity Assessment: Defer to PT evaluation       Communication  Communication Communication: No difficulties   Cognition Arousal/Alertness: Awake/alert Behavior During Therapy: WFL for tasks assessed/performed Overall Cognitive Status: Within Functional Limits for tasks assessed                                                Home Living Family/patient expects to be discharged to:: Private residence Living Arrangements: Alone Available Help at Discharge: Neighbor Type of Home: House Home Access: Stairs to enter [employer] of Steps: 3   Home Layout: One level     Bathroom Shower/Tub: Occupational psychologist: Handicapped height     Home Equipment: Grab bars - tub/shower;Shower seat;Cane - single point          Prior Functioning/Environment Level of Independence: Independent        Comments: driving, grocery shopping        OT Problem List: Decreased range of motion;Decreased activity tolerance;Decreased knowledge of precautions;Pain      OT Treatment/Interventions: Self-care/ADL training;Therapeutic activities;Patient/family education;Balance training;Energy conservation    OT Goals(Current goals can be found in the care plan section) Acute Rehab OT Goals Patient Stated Goal: to have less pain OT Goal Formulation: With patient Time For Goal Achievement: 06/22/19 Potential to Achieve Goals: Good  OT Frequency: Min 2X/week   Barriers to D/C: Decreased caregiver support  Pt would benefit from assistance for IADLs       Co-evaluation PT/OT/SLP Co-Evaluation/Treatment: Yes Reason for Co-Treatment: Complexity of the patient's impairments (multi-system involvement);To address functional/ADL transfers   OT goals addressed during session: ADL's and self-care      AM-PAC OT "6 Clicks" Daily Activity     Outcome Measure Help from another person eating meals?: None Help from another person taking care of personal grooming?: A Little Help from another person toileting, which includes  using toliet, bedpan, or urinal?: A Little Help from another person bathing (including washing, rinsing, drying)?: A Little Help from another person to put on and taking off regular upper body clothing?: A Little Help from another person to put on and taking off regular lower body clothing?: A Little 6 Click Score: 19   End of Session Equipment Utilized During Treatment: Gait belt;Rolling walker;Back brace Nurse Communication: Mobility status  Activity Tolerance: Patient limited by pain(increased pain after ambulation) Patient left: in bed;with call bell/phone within reach  OT Visit Diagnosis: Unsteadiness on feet (R26.81);Muscle weakness (generalized) (M62.81);Pain Pain - part of body: (back)                Time: 6834-1962 OT Time Calculation (min): 34 min Charges:  OT General Charges $OT Visit: 1 Visit OT Evaluation $OT Eval Moderate Complexity: [place], [place] 06/08/2019, 10:28 AM

## 2019-06-08 NOTE — Progress Notes (Signed)
PROGRESS NOTE    [name]  RSW:[id number] DOB: 11/25/1929 DOA: 06/05/2019 PCP: Thomas Sites, MD   Brief Narrative:  HPI per Gean Birchwood on 06/06/2019 [name] is a 86 y.o. male with history of COPD, MDS was recently admitted 3 weeks ago for COPD/bronchiectasis exacerbation presents to the ER with worsening low back pain for the third time in last 1 week.  Patient's pain started about a week ago which has been constant with no radiation mostly in the low back.  Has had kyphoplasty previously by Dr. Kathyrn Sheriff neurosurgeon.  Denies any incontinence of urine bowel fever chills or any weakness of the extremities.  Pain does not radiate to the lower extremities.  ED Course: In the ER patient had x-rays which did not show any acute fractures.  As a CT lumbar spine during last visit few days ago which also did not show any acute fractures.  Does have chronic lumbar and thoracic fractures.  Patient has benign persistent pain despite multiple dose of pain relief medication and has been admitted for further management.  ER physician has discussed with Dr. Venetia Constable on-call neurosurgery will be seeing patient in consult.  **Interim History  MRI was done and showed no acute fracture.  Patient's pain is still uncontrolled and 5-10.  Neurosurgery planning on doing a kyphoplasty tomorrow afternoon.  Assessment & Plan:   Principal Problem:   Acute exacerbation of chronic low back pain Active Problems:   GERD (gastroesophageal reflux disease)   Anemia   Pulmonary nodule   Thrombocytopenia (HCC)   Chronic kidney insufficiency   Intractable back pain  Acute Exacerbation of Chronic Low Back pain in the setting of Acute T11 Compression Fracture -About a month ago patient underwent L2 vertebral augmentation per Dr. Kathyrn Sheriff and was doing well until he went to pick up a 4 foot long 2 x 4.  He reports he felt a "pop" in his back and developed immediate back pain a few days ago when he was  lifting a piece of ood at [employer].   - Evaluated by neurosurgery who opine current imaging shows chronic compression fractures and questionable acute T12 compression fracture.   -MRI Thoracic and Lumbar spine showed "Truncated and motion degraded examination. Acute or subacute compression fracture of T11 with mild edema, approximately 25% height loss and 6 mm retropulsion. No associated spinal canal stenosis.  Multiple chronic compression fractures of the thoracic and lumbar spine. Heterogeneous bone marrow signal within the lower thoracic spine and lumbar spine, compatible with reported myelodysplastic syndrome." -Changed patient's pain medication to scheduled Acetaminophen 1,000 mg po TID, Scheduled Methocarbamol 500 mg po 4 Times Daily now stopped,  -C/w Oxycodone 5 mg q4hprn Severe Pain and Fentanyl 50 mcg IV q2hPRN -C/w TLSO Brace for the patient  -PT/OT and ok to Ambulate with TLSO per Neurosurgery  -Patient to go for Kyphoplasty tomorrow Afternoon and he is to be NPO at [place] and Neurosurgery recommending D/C'ing all Blood Thining Agents  Chronic Kidney Disease Stage IIIa.  -Chart review indicates nephrologist in [place].   -Creatinine baseline 1.6-2.0 range.   -Patient's BUN/Cr went from 27/1.59 -> 27/1.36 -> 28/1.59 -Avoid Nephrotoxic Medications, Contrast Dyes, Hypotension and Renally Adjust Medications -Continue to Monitor and Trend Renal Function -Repeat CMP in AM   COPD.  Not on home oxygen.   -Home medications include albuterol inhaler.  Stable at baseline -Takes Spiriva as well but not listed on his home [facility] so we will need to reach out to pharmacy  to add  History of MDS. Pancytopenia   -Lab work reveals anemia and thrombocytopenia and Leukopenia.   -Chart review indicates current hemoglobin of 10.5 is close to his baseline.   -In addition chart review indicates platelet count of 95 is a little less than baseline. -Patient's WBC was 3.5, Hb/Hct wasd 9.8/30.7, and  Platelet Count was 90,000 -Sees Hematology in [place] -Continue to Monitor for S/Sx of Bleeding -Repeat CBC in AM   DVT prophylaxis: SCDs Code Status: FULL CODE Family Communication: No Family at bedside  Disposition Plan: Patient from home who ended up having an acute compression fracture requiring Neurosurgical Intervention and Evaluation by PT/OT prior to discharge safely with discharge disposition pending.  Consultants:   Neurosurgery   Procedures: MRI  Antimicrobials:  Anti-infectives (From admission, onward)   None     Subjective: Patient was seen and examined at bedside and states that his pain is a 5/10 and is constnt. No CP.SOB, Nausea or vomiting. Gets indigenstion occasionally.  No lightheadedness or dizziness.  No other concerns complaints at this time.  Objective: Vitals:   06/08/19 0218 06/08/19 0356 06/08/19 0755 06/08/19 0755  BP: 125/70 117/86 99/61 99/61   Pulse: 67 73 74 72  Resp: 18 18 16 16   Temp:  98.3 F (36.8 C) 98.4 F (36.9 C) 98.4 F (36.9 C)  TempSrc:  Oral Oral Oral  SpO2: 93% 92% 94% 94%    Intake/Output Summary (Last 24 hours) at 06/08/2019 0820 Last data filed at 06/08/2019 0145 Gross per 24 hour  Intake 1090 ml  Output --  Net 1090 ml   There were no vitals filed for this visit.  Examination: Physical Exam:  Constitutional: Thin elderly Caucasian male currently in NAD and appears calm and but a little uncomfortable Eyes: Lids and conjunctivae normal, sclerae anicteric  ENMT: External Ears, Nose appear normal. Grossly normal hearing Neck: Appears normal, supple, no cervical masses, normal ROM, no appreciable thyromegaly; no JVD Respiratory: Diminished to auscultation bilaterally, no wheezing, rales, rhonchi or crackles. Normal respiratory effort and patient is not tachypenic. No accessory muscle use. Unlabored breathing and not wearing any supplemental O2 via [employer] [facility]: RRR, no murmurs / rubs / gallops. S1 and S2  auscultated. Trace extremity edema.  Abdomen: Soft, non-tender, non-distended. Bowel sounds positive.  GU: Deferred. Musculoskeletal: No clubbing / cyanosis of digits/nails. No joint deformity upper and lower extremities.  Skin: No rashes, lesions, ulcers on a limited skin evaluation. No induration; Warm and dry.  Neurologic: CN 2-12 grossly intact with no focal deficits. Romberg sign and cerebellar reflexes not assessed.  Psychiatric: Normal judgment and insight. Alert and oriented x 3. Pleasant mood and appropriate affect.   Data Reviewed: I have personally reviewed following labs and imaging studies  CBC: Recent Labs  Lab 06/05/19 1531 06/06/19 0442 06/07/19 0621  WBC 5.3 2.5* 3.5*  HGB 10.6* 10.5* 9.8*  HCT 33.8* 32.6* 30.7*  MCV 91.1 90.1 90.0  PLT 100* 95* 90*   Basic Metabolic Panel: Recent Labs  Lab 06/05/19 1531 06/06/19 0442 06/07/19 0621  NA 140 140 140  K 3.7 4.7 3.8  CL 105 106 106  CO2 23 26 23   GLUCOSE 93 126* 92  BUN 27* 27* 28*  CREATININE 1.59* 1.36* 1.59*  CALCIUM 9.6 9.6 9.2   GFR: Estimated Creatinine Clearance: 29.4 mL/min (A) (by C-G formula based on SCr of 1.59 mg/dL (H)). Liver Function Tests: Recent Labs  Lab 06/05/19 1531  AST 16  ALT 15  ALKPHOS 61  BILITOT 1.4*  PROT 6.8  ALBUMIN 4.0   No results for input(s): LIPASE, AMYLASE in the last 168 hours. No results for input(s): AMMONIA in the last 168 hours. Coagulation Profile: No results for input(s): INR, PROTIME in the last 168 hours. Cardiac Enzymes: No results for input(s): CKTOTAL, CKMB, CKMBINDEX, TROPONINI in the last 168 hours. BNP (last 3 results) No results for input(s): PROBNP in the last 8760 hours. HbA1C: No results for input(s): HGBA1C in the last 72 hours. CBG: Recent Labs  Lab 06/05/19 1525  GLUCAP 87   Lipid Profile: No results for input(s): CHOL, HDL, LDLCALC, TRIG, CHOLHDL, LDLDIRECT in the last 72 hours. Thyroid Function Tests: No results for input(s):  TSH, T4TOTAL, FREET4, T3FREE, THYROIDAB in the last 72 hours. Anemia Panel: No results for input(s): VITAMINB12, FOLATE, FERRITIN, TIBC, IRON, RETICCTPCT in the last 72 hours. Sepsis Labs: No results for input(s): PROCALCITON, LATICACIDVEN in the last 168 hours.  Recent Results (from the past 240 hour(s))  SARS CORONAVIRUS 2 (TAT 6-24 HRS) Nasopharyngeal Nasopharyngeal Swab     Status: None   Collection Time: 06/06/19  2:35 AM   Specimen: Nasopharyngeal Swab  Result Value Ref Range Status   SARS Coronavirus 2 NEGATIVE NEGATIVE Final    Comment: (NOTE) SARS-CoV-2 target nucleic acids are NOT DETECTED. The SARS-CoV-2 RNA is generally detectable in upper and lower respiratory specimens during the acute phase of infection. Negative results do not preclude SARS-CoV-2 infection, do not rule out co-infections with other pathogens, and should not be used as the sole basis for treatment or other patient management decisions. Negative results must be combined with clinical observations, patient history, and epidemiological information. The expected result is Negative. Fact Sheet for Patients: SugarRoll.be Fact Sheet for Healthcare Providers: https://www.woods-mathews.com/ This test is not yet approved or cleared by the [place] FDA and  has been authorized for detection and/or diagnosis of SARS-CoV-2 by FDA under an Emergency Use Authorization (EUA). This EUA will remain  in effect (meaning this test can be used) for the duration of the COVID-19 declaration under Section 56 4(b)(1) of the Act, 21 U.S.C. section 360bbb-3(b)(1), unless the authorization is terminated or revoked sooner. Performed at [facility], [place] [address], [place], [place] [address]     Radiology Studies: MR THORACIC SPINE WO CONTRAST  Result Date: 06/07/2019 CLINICAL DATA:  Low back pain EXAM: MRI THORACIC AND LUMBAR SPINE WITHOUT CONTRAST TECHNIQUE: Multiplanar and  multiecho pulse sequences of the thoracic and lumbar spine were obtained without intravenous contrast. COMPARISON:  None. FINDINGS: Examination is degraded by motion. Additionally, the patient could not tolerate the full length of the examination. Only 1 axial sequence of the thoracic spine was acquired. MRI THORACIC SPINE FINDINGS Alignment:  Normal alignment with increased thoracic kyphosis. Vertebrae: Acute compression fracture of T11 with approximately 25% central height loss and retropulsion measuring 6 mm. There are chronic compression deformities at T6 through T8. Cord:  Normal signal and morphology. Paraspinal and other soft tissues: Negative. Disc levels: Severe motion degradation on axial images. Within that limitation, there is no significant spinal canal stenosis. MRI LUMBAR SPINE FINDINGS Segmentation:  Standard. Alignment:  Physiologic. Vertebrae: Status post L2 vertebral augmentation. Approximately 50% central height loss at L2, L3 and L4. No acute fracture. Lumbar bone marrow signal is diffusely heterogeneous. Conus medullaris and cauda equina: Conus extends to the L1-2 disc level. Conus and cauda equina appear normal. Paraspinal and other soft tissues: Negative Disc levels: No axial imaging of the lumbar  spine was acquired. Based on sagittal imaging, there is no spinal canal stenosis. There is moderate bilateral L5 neural foraminal stenosis. IMPRESSION: 1. Truncated and motion degraded examination. 2. Acute or subacute compression fracture of T11 with mild edema, approximately 25% height loss and 6 mm retropulsion. No associated spinal canal stenosis. 3. Multiple chronic compression fractures of the thoracic and lumbar spine. 4. Heterogeneous bone marrow signal within the lower thoracic spine and lumbar spine, compatible with reported myelodysplastic syndrome. Electronically Signed   By: Ulyses Jarred M.D.   On: 06/07/2019 20:59   MR LUMBAR SPINE WO CONTRAST  Result Date: 06/07/2019 CLINICAL  DATA:  Low back pain EXAM: MRI THORACIC AND LUMBAR SPINE WITHOUT CONTRAST TECHNIQUE: Multiplanar and multiecho pulse sequences of the thoracic and lumbar spine were obtained without intravenous contrast. COMPARISON:  None. FINDINGS: Examination is degraded by motion. Additionally, the patient could not tolerate the full length of the examination. Only 1 axial sequence of the thoracic spine was acquired. MRI THORACIC SPINE FINDINGS Alignment:  Normal alignment with increased thoracic kyphosis. Vertebrae: Acute compression fracture of T11 with approximately 25% central height loss and retropulsion measuring 6 mm. There are chronic compression deformities at T6 through T8. Cord:  Normal signal and morphology. Paraspinal and other soft tissues: Negative. Disc levels: Severe motion degradation on axial images. Within that limitation, there is no significant spinal canal stenosis. MRI LUMBAR SPINE FINDINGS Segmentation:  Standard. Alignment:  Physiologic. Vertebrae: Status post L2 vertebral augmentation. Approximately 50% central height loss at L2, L3 and L4. No acute fracture. Lumbar bone marrow signal is diffusely heterogeneous. Conus medullaris and cauda equina: Conus extends to the L1-2 disc level. Conus and cauda equina appear normal. Paraspinal and other soft tissues: Negative Disc levels: No axial imaging of the lumbar spine was acquired. Based on sagittal imaging, there is no spinal canal stenosis. There is moderate bilateral L5 neural foraminal stenosis. IMPRESSION: 1. Truncated and motion degraded examination. 2. Acute or subacute compression fracture of T11 with mild edema, approximately 25% height loss and 6 mm retropulsion. No associated spinal canal stenosis. 3. Multiple chronic compression fractures of the thoracic and lumbar spine. 4. Heterogeneous bone marrow signal within the lower thoracic spine and lumbar spine, compatible with reported myelodysplastic syndrome. Electronically Signed   By: Ulyses Jarred M.D.   On: 06/07/2019 20:59   Scheduled Meds: . acetaminophen  1,000 mg Oral TID  . methocarbamol  500 mg Oral QID  . senna  1 tablet Oral Daily  . sodium chloride flush  3 mL Intravenous Once   Continuous Infusions:   LOS: 2 days   Kerney Elbe, DO [facility] PAGER is on [place]  If 7PM-7AM, please contact night-coverage [email or web address]

## 2019-06-09 ENCOUNTER — Inpatient Hospital Stay (HOSPITAL_COMMUNITY): Payer: Medicare Other | Admitting: Anesthesiology

## 2019-06-09 ENCOUNTER — Inpatient Hospital Stay (HOSPITAL_COMMUNITY): Payer: Medicare Other

## 2019-06-09 ENCOUNTER — Encounter (HOSPITAL_COMMUNITY): Payer: Self-pay | Admitting: Internal Medicine

## 2019-06-09 ENCOUNTER — Encounter (HOSPITAL_COMMUNITY)
Admission: EM | Disposition: A | Discharge status: Home Health | Payer: Self-pay | Source: Home / Self Care | Attending: Internal Medicine

## 2019-06-09 DIAGNOSIS — S22080A Wedge compression fracture of T11-T12 vertebra, initial encounter for closed fracture: Secondary | ICD-10-CM | POA: Diagnosis not present

## 2019-06-09 HISTORY — PX: KYPHOPLASTY: SHX5884

## 2019-06-09 LAB — COMPREHENSIVE METABOLIC PANEL
ALT: 11 U/L (ref 0–44)
AST: 12 U/L — ABNORMAL LOW (ref 15–41)
Albumin: 3.4 g/dL — ABNORMAL LOW (ref 3.5–5.0)
Alkaline Phosphatase: 52 U/L (ref 38–126)
Anion gap: 7 (ref 5–15)
BUN: 22 mg/dL (ref 8–23)
CO2: 25 mmol/L (ref 22–32)
Calcium: 9 mg/dL (ref 8.9–10.3)
Chloride: 106 mmol/L (ref 98–111)
Creatinine, Ser: 1.41 mg/dL — ABNORMAL HIGH (ref 0.61–1.24)
GFR calc Af Amer: 52 mL/min — ABNORMAL LOW (ref >60)
GFR calc non Af Amer: 44 mL/min — ABNORMAL LOW (ref >60)
Glucose, Bld: 85 mg/dL (ref 70–99)
Potassium: 3.9 mmol/L (ref 3.5–5.1)
Sodium: 138 mmol/L (ref 135–145)
Total Bilirubin: 1 mg/dL (ref 0.3–1.2)
Total Protein: 5.6 g/dL — ABNORMAL LOW (ref 6.5–8.1)

## 2019-06-09 LAB — PHOSPHORUS: Phosphorus: 3.1 mg/dL (ref 2.5–4.6)

## 2019-06-09 LAB — CBC WITH DIFFERENTIAL/PLATELET
Abs Immature Granulocytes: 0.08 10*3/uL — ABNORMAL HIGH (ref 0.00–0.07)
Basophils Absolute: 0 10*3/uL (ref 0.0–0.1)
Basophils Relative: 1 %
Eosinophils Absolute: 0 10*3/uL (ref 0.0–0.5)
Eosinophils Relative: 0 %
HCT: 30.6 % — ABNORMAL LOW (ref 39.0–52.0)
Hemoglobin: 9.8 g/dL — ABNORMAL LOW (ref 13.0–17.0)
Immature Granulocytes: 3 %
Lymphocytes Relative: 35 %
Lymphs Abs: 0.9 10*3/uL (ref 0.7–4.0)
MCH: 28.7 pg (ref 26.0–34.0)
MCHC: 32 g/dL (ref 30.0–36.0)
MCV: 89.7 fL (ref 80.0–100.0)
Monocytes Absolute: 0.4 10*3/uL (ref 0.1–1.0)
Monocytes Relative: 14 %
Neutro Abs: 1.2 10*3/uL — ABNORMAL LOW (ref 1.7–7.7)
Neutrophils Relative %: 47 %
Platelets: 90 10*3/uL — ABNORMAL LOW (ref 150–400)
RBC: 3.41 MIL/uL — ABNORMAL LOW (ref 4.22–5.81)
RDW: 20.3 % — ABNORMAL HIGH (ref 11.5–15.5)
WBC: 2.6 10*3/uL — ABNORMAL LOW (ref 4.0–10.5)
nRBC: 0 % (ref 0.0–0.2)

## 2019-06-09 LAB — SURGICAL PCR SCREEN
MRSA, PCR: NEGATIVE
Staphylococcus aureus: NEGATIVE

## 2019-06-09 LAB — MAGNESIUM: Magnesium: 1.8 mg/dL (ref 1.7–2.4)

## 2019-06-09 SURGERY — KYPHOPLASTY
Anesthesia: General | Laterality: Bilateral

## 2019-06-09 MED ORDER — DEXAMETHASONE SODIUM PHOSPHATE 10 MG/ML IJ SOLN
INTRAMUSCULAR | Status: DC | PRN
  Administered 2019-06-09: 5 mg via INTRAVENOUS

## 2019-06-09 MED ORDER — PHENYLEPHRINE 40 MCG/ML (10ML) SYRINGE FOR IV PUSH (FOR BLOOD PRESSURE SUPPORT)
PREFILLED_SYRINGE | INTRAVENOUS | Status: DC | PRN
  Administered 2019-06-09: 80 ug via INTRAVENOUS

## 2019-06-09 MED ORDER — PROMETHAZINE HCL 25 MG/ML IJ SOLN: 6.2500 mg | INTRAMUSCULAR | Status: DC | PRN

## 2019-06-09 MED ORDER — ROCURONIUM BROMIDE 10 MG/ML (PF) SYRINGE
PREFILLED_SYRINGE | INTRAVENOUS | Status: AC
  Filled 2019-06-09: qty 10

## 2019-06-09 MED ORDER — ACETAMINOPHEN 500 MG PO TABS
1000.0000 mg | ORAL_TABLET | Freq: Once | ORAL | Status: AC
  Administered 2019-06-09: 1000 mg via ORAL
  Filled 2019-06-09: qty 2

## 2019-06-09 MED ORDER — PROPOFOL 10 MG/ML IV BOLUS
INTRAVENOUS | Status: DC | PRN
  Administered 2019-06-09: 120 mg via INTRAVENOUS

## 2019-06-09 MED ORDER — ONDANSETRON HCL 4 MG/2ML IJ SOLN
INTRAMUSCULAR | Status: DC | PRN
  Administered 2019-06-09: 4 mg via INTRAVENOUS

## 2019-06-09 MED ORDER — SODIUM CHLORIDE 0.9% FLUSH
3.0000 mL | Freq: Two times a day (BID) | INTRAVENOUS | Status: DC
  Administered 2019-06-09 – 2019-06-12 (×6): 3 mL via INTRAVENOUS

## 2019-06-09 MED ORDER — CEFAZOLIN SODIUM-DEXTROSE 2-4 GM/100ML-% IV SOLN
2.0000 g | Freq: Three times a day (TID) | INTRAVENOUS | Status: AC
  Administered 2019-06-09 – 2019-06-10 (×2): 2 g via INTRAVENOUS
  Filled 2019-06-09 (×2): qty 100

## 2019-06-09 MED ORDER — ONDANSETRON HCL 4 MG/2ML IJ SOLN
INTRAMUSCULAR | Status: AC
  Filled 2019-06-09: qty 2

## 2019-06-09 MED ORDER — SUGAMMADEX SODIUM 200 MG/2ML IV SOLN
INTRAVENOUS | Status: DC | PRN
  Administered 2019-06-09: 120 mg via INTRAVENOUS

## 2019-06-09 MED ORDER — FENTANYL CITRATE (PF) 250 MCG/5ML IJ SOLN
INTRAMUSCULAR | Status: AC
  Filled 2019-06-09: qty 5

## 2019-06-09 MED ORDER — SODIUM CHLORIDE 0.9 % IV SOLN
250.0000 mL | INTRAVENOUS | Status: DC
  Administered 2019-06-09: 250 mL via INTRAVENOUS

## 2019-06-09 MED ORDER — IOPAMIDOL (ISOVUE-300) INJECTION 61%
INTRAVENOUS | Status: DC | PRN
  Administered 2019-06-09: 30 mL

## 2019-06-09 MED ORDER — SUCCINYLCHOLINE CHLORIDE 200 MG/10ML IV SOSY
PREFILLED_SYRINGE | INTRAVENOUS | Status: AC
  Filled 2019-06-09: qty 10

## 2019-06-09 MED ORDER — BUPIVACAINE HCL (PF) 0.5 % IJ SOLN
INTRAMUSCULAR | Status: AC
  Filled 2019-06-09: qty 30

## 2019-06-09 MED ORDER — LIDOCAINE 2% (20 MG/ML) 5 ML SYRINGE
INTRAMUSCULAR | Status: DC | PRN
  Administered 2019-06-09: 60 mg via INTRAVENOUS

## 2019-06-09 MED ORDER — DEXAMETHASONE SODIUM PHOSPHATE 10 MG/ML IJ SOLN
INTRAMUSCULAR | Status: AC
  Filled 2019-06-09: qty 1

## 2019-06-09 MED ORDER — ROCURONIUM BROMIDE 10 MG/ML (PF) SYRINGE
PREFILLED_SYRINGE | INTRAVENOUS | Status: DC | PRN
  Administered 2019-06-09: 20 mg via INTRAVENOUS

## 2019-06-09 MED ORDER — 0.9 % SODIUM CHLORIDE (POUR BTL) OPTIME
TOPICAL | Status: DC | PRN
  Administered 2019-06-09: 18:00:00 1000 mL

## 2019-06-09 MED ORDER — LIDOCAINE-EPINEPHRINE 1 %-1:100000 IJ SOLN
INTRAMUSCULAR | Status: DC | PRN
  Administered 2019-06-09: 3 mL

## 2019-06-09 MED ORDER — PROPOFOL 10 MG/ML IV BOLUS
INTRAVENOUS | Status: AC
  Filled 2019-06-09: qty 20

## 2019-06-09 MED ORDER — FENTANYL CITRATE (PF) 100 MCG/2ML IJ SOLN
INTRAMUSCULAR | Status: AC
  Filled 2019-06-09: qty 2

## 2019-06-09 MED ORDER — BUPIVACAINE HCL (PF) 0.5 % IJ SOLN
INTRAMUSCULAR | Status: DC | PRN
  Administered 2019-06-09: 3 mL

## 2019-06-09 MED ORDER — LIDOCAINE-EPINEPHRINE 1 %-1:100000 IJ SOLN
INTRAMUSCULAR | Status: AC
  Filled 2019-06-09: qty 1

## 2019-06-09 MED ORDER — LIDOCAINE 2% (20 MG/ML) 5 ML SYRINGE
INTRAMUSCULAR | Status: AC
  Filled 2019-06-09: qty 5

## 2019-06-09 MED ORDER — SODIUM CHLORIDE 0.9% FLUSH: 3.0000 mL | INTRAVENOUS | Status: DC | PRN

## 2019-06-09 MED ORDER — PHENYLEPHRINE 40 MCG/ML (10ML) SYRINGE FOR IV PUSH (FOR BLOOD PRESSURE SUPPORT)
PREFILLED_SYRINGE | INTRAVENOUS | Status: AC
  Filled 2019-06-09: qty 10

## 2019-06-09 MED ORDER — FENTANYL CITRATE (PF) 100 MCG/2ML IJ SOLN
INTRAMUSCULAR | Status: DC | PRN
  Administered 2019-06-09 (×2): 50 ug via INTRAVENOUS

## 2019-06-09 MED ORDER — CEFAZOLIN SODIUM-DEXTROSE 2-4 GM/100ML-% IV SOLN
2.0000 g | INTRAVENOUS | Status: AC
  Administered 2019-06-09: 2 g via INTRAVENOUS
  Filled 2019-06-09: qty 100

## 2019-06-09 MED ORDER — FENTANYL CITRATE (PF) 100 MCG/2ML IJ SOLN
25.0000 ug | INTRAMUSCULAR | Status: DC | PRN
  Administered 2019-06-09 (×2): 50 ug via INTRAVENOUS

## 2019-06-09 MED ORDER — LACTATED RINGERS IV SOLN: INTRAVENOUS | Status: DC

## 2019-06-09 MED ORDER — SUCCINYLCHOLINE CHLORIDE 20 MG/ML IJ SOLN
INTRAMUSCULAR | Status: DC | PRN
  Administered 2019-06-09: 120 mg via INTRAVENOUS

## 2019-06-09 MED ORDER — CELECOXIB 200 MG PO CAPS
200.0000 mg | ORAL_CAPSULE | Freq: Once | ORAL | Status: AC
  Administered 2019-06-09: 200 mg via ORAL
  Filled 2019-06-09: qty 1

## 2019-06-09 SURGICAL SUPPLY — 38 items
BLADE CLIPPER SURG (BLADE) IMPLANT
BLADE SURG 15 STRL LF DISP TIS (BLADE) ×1 IMPLANT
BLADE SURG 15 STRL SS (BLADE) ×1
CEMENT BONE KYPHX HV R (Orthopedic Implant) ×2 IMPLANT
COVER WAND RF STERILE (DRAPES) ×2 IMPLANT
DERMABOND ADVANCED (GAUZE/BANDAGES/DRESSINGS) ×1
DERMABOND ADVANCED .7 DNX12 (GAUZE/BANDAGES/DRESSINGS) ×1 IMPLANT
DRAPE C-ARM 42X72 X-RAY (DRAPES) ×2 IMPLANT
DRAPE HALF SHEET 40X57 (DRAPES) ×2 IMPLANT
DRAPE INCISE IOBAN 66X45 STRL (DRAPES) ×2 IMPLANT
DRAPE LAPAROTOMY 100X72X124 (DRAPES) ×2 IMPLANT
DRAPE SURG 17X23 STRL (DRAPES) ×2 IMPLANT
DRAPE WARM FLUID 44X44 (DRAPES) ×2 IMPLANT
DURAPREP 26ML APPLICATOR (WOUND CARE) ×2 IMPLANT
GAUZE 4X4 16PLY RFD (DISPOSABLE) ×2 IMPLANT
GLOVE BIO SURGEON STRL SZ7.5 (GLOVE) IMPLANT
GLOVE BIOGEL PI IND STRL 7.5 (GLOVE) ×2 IMPLANT
GLOVE BIOGEL PI INDICATOR 7.5 (GLOVE) ×2
GLOVE ECLIPSE 7.0 STRL STRAW (GLOVE) ×2 IMPLANT
GLOVE EXAM NITRILE XL STR (GLOVE) IMPLANT
GOWN STRL REUS W/ TWL LRG LVL3 (GOWN DISPOSABLE) ×2 IMPLANT
GOWN STRL REUS W/ TWL XL LVL3 (GOWN DISPOSABLE) IMPLANT
GOWN STRL REUS W/TWL 2XL LVL3 (GOWN DISPOSABLE) IMPLANT
GOWN STRL REUS W/TWL LRG LVL3 (GOWN DISPOSABLE) ×2
GOWN STRL REUS W/TWL XL LVL3 (GOWN DISPOSABLE)
KIT BASIN OR (CUSTOM PROCEDURE TRAY) ×2 IMPLANT
KIT TURNOVER KIT B (KITS) ×2 IMPLANT
MIXER KYPHON (MISCELLANEOUS) ×2 IMPLANT
NEEDLE HYPO 25X1 1.5 SAFETY (NEEDLE) ×2 IMPLANT
NS IRRIG 1000ML POUR BTL (IV SOLUTION) ×2 IMPLANT
PACK SURGICAL SETUP 50X90 (CUSTOM PROCEDURE TRAY) ×2 IMPLANT
PAD ARMBOARD 7.5X6 YLW CONV (MISCELLANEOUS) ×6 IMPLANT
SPECIMEN JAR SMALL (MISCELLANEOUS) IMPLANT
SUT VICRYL 3-0 RB1 18 ABS (SUTURE) ×2 IMPLANT
SYR CONTROL 10ML LL (SYRINGE) ×4 IMPLANT
TOWEL GREEN STERILE (TOWEL DISPOSABLE) ×2 IMPLANT
TOWEL GREEN STERILE FF (TOWEL DISPOSABLE) ×2 IMPLANT
TRAY KYPHOPAK 15/3 ONESTEP 1ST (MISCELLANEOUS) ×2 IMPLANT

## 2019-06-09 NOTE — Progress Notes (Signed)
  NEUROSURGERY PROGRESS NOTE   No issues overnight. Cont to have back pain.  EXAM:  BP 108/69 (BP Location: Right Arm)   Pulse 70   Temp 98.8 F (37.1 C) (Oral)   Resp 20   Ht 6' 3.98" (1.93 m)   Wt 63.5 kg   SpO2 95%   BMI 17.05 kg/m   Awake, alert, oriented  Speech fluent, appropriate  CN grossly intact  MAE well  IMPRESSION:  86 y.o. male with new onset back pain related to acute/subacute T11 compression fracture  PLAN: - Will proceed with T11 kyphoplasty  I have reviewed the procedure with the patient. Risks/benefits/alternatives were discussed. Pt had same procedure at L2 a few weeks ago. All questions today were answered and consent was obtained.

## 2019-06-09 NOTE — Progress Notes (Signed)
PROGRESS NOTE    TIRON SUSKI  IWL:[id number] DOB: [birth year] DOA: 06/05/2019 PCP: Sharilyn Sites, MD   Brief Narrative:  HPI per Gean Birchwood on 06/06/2019 KYNDAL GLOSTER is a 86 y.o. male with history of COPD, MDS was recently admitted 3 weeks ago for COPD/bronchiectasis exacerbation presents to the ER with worsening low back pain for the third time in last 1 week.  Patient's pain started about a week ago which has been constant with no radiation mostly in the low back.  Has had kyphoplasty previously by Dr. Kathyrn Sheriff neurosurgeon.  Denies any incontinence of urine bowel fever chills or any weakness of the extremities.  Pain does not radiate to the lower extremities.  ED Course: In the ER patient had x-rays which did not show any acute fractures.  As a CT lumbar spine during last visit few days ago which also did not show any acute fractures.  Does have chronic lumbar and thoracic fractures.  Patient has benign persistent pain despite multiple dose of pain relief medication and has been admitted for further management.  ER physician has discussed with Dr. Venetia Constable on-call neurosurgery will be seeing patient in consult.   Assessment & Plan:   Principal Problem:   Acute exacerbation of chronic low back pain Active Problems:   GERD (gastroesophageal reflux disease)   Anemia   Pulmonary nodule   Thrombocytopenia (HCC)   Chronic kidney insufficiency   Intractable back pain  Acute Exacerbation of Chronic Low Back pain in the setting of Acute T11 Compression Fracture -About a month ago patient underwent L2 vertebral augmentation per Dr. Kathyrn Sheriff and was doing well until he went to pick up a 4 foot long 2 x 4.  He reports he felt a "pop" in his back and developed immediate back pain a few days ago when he was lifting a piece of ood at [employer].   - Evaluated by neurosurgery who opine current imaging shows chronic compression fractures and questionable acute T12 compression fracture.     -MRI Thoracic and Lumbar spine showed "Truncated and motion degraded examination. Acute or subacute compression fracture of T11 with mild edema, approximately 25% height loss and 6 mm retropulsion. No associated spinal canal stenosis.  Multiple chronic compression fractures of the thoracic and lumbar spine. Heterogeneous bone marrow signal within the lower thoracic spine and lumbar spine, compatible with reported myelodysplastic syndrome." -Changed patient's pain medication to scheduled Acetaminophen 1,000 mg po TID, Scheduled Methocarbamol 500 mg po 4 Times Daily now stopped,  -C/w Oxycodone 5 mg q4hprn Severe Pain and Fentanyl 50 mcg IV q2hPRN -C/w TLSO Brace for the patient  -PT/OT and ok to Ambulate with TLSO per Neurosurgery  -Patient underwent T11 kyphoplasty on 1/22.  Anticipate continued physical therapy and pain management with possible discharge in next 24 hours if improved.  Chronic Kidney Disease Stage IIIa.  -Chart review indicates nephrologist in [place].   -Creatinine baseline 1.6-2.0 range.   -Patient's BUN/Cr went from 27/1.59 -> 27/1.36 -> 28/1.59>22/1.41 -Avoid Nephrotoxic Medications, Contrast Dyes, Hypotension and Renally Adjust Medications -Continue to Monitor and Trend Renal Function -Repeat CMP in AM   COPD.  Not on home oxygen.   -Home medications include albuterol inhaler.  Stable at baseline -Takes Spiriva as well but not listed on his home MAR so we will need to reach out to pharmacy to add -Postoperatively, he was continued on oxygen.  This will be weaned off as tolerated.  History of MDS. Pancytopenia   -Lab work reveals anemia  and thrombocytopenia and Leukopenia.   -Chart review indicates current hemoglobin of 10.5 is close to his baseline.   -In addition chart review indicates platelet count of 95 is a little less than baseline. -Patient's WBC was 2.6, Hb/Hct wasd 9.8/30.6, and Platelet Count was 90,000 -Sees Hematology in [place] -Continue to  Monitor for S/Sx of Bleeding -Repeat CBC in AM   DVT prophylaxis: SCDs Code Status: FULL CODE Family Communication: No Family at bedside  Disposition Plan: Anticipate discharge home in the next 24 hours if symptoms are improved.  He will need home health PT and OT.  Consultants:   Neurosurgery   Procedures: MRI, T11 kyphoplasty  Antimicrobials:  Anti-infectives (From admission, onward)   Start     Dose/Rate Route Frequency Ordered Stop   06/09/19 1945  ceFAZolin (ANCEF) IVPB 2g/100 mL premix     2 g 200 mL/hr over 30 Minutes Intravenous Every 8 hours 06/09/19 1935 06/10/19 1144   06/09/19 0800  ceFAZolin (ANCEF) IVPB 2g/100 mL premix     2 g 200 mL/hr over 30 Minutes Intravenous 30 min pre-op 06/09/19 0093 06/09/19 1753     Subjective: Patient seen in his room postoperatively.  Reports that he still has pain and soreness in his back.  Denies any shortness of breath.  Objective: Vitals:   06/09/19 1900 06/09/19 1901 06/09/19 1915 06/09/19 2014  BP:  119/80 135/73 (!) 115/59  Pulse: 72  68 71  Resp: (!) 21 20 11 17   Temp:    98.2 F (36.8 C)  TempSrc:    Oral  SpO2: 99%  100% 97%  Weight:      Height:        Intake/Output Summary (Last 24 hours) at 06/09/2019 2115 Last data filed at 06/09/2019 1830 Gross per 24 hour  Intake 300 ml  Output 2 ml  Net 298 ml   Filed Weights   06/09/19 1655  Weight: 63.5 kg    Examination: Physical Exam:  General exam: Alert, awake, oriented x 3 Respiratory system: Diminished breath sounds bilaterally. Respiratory effort normal. Cardiovascular system:RRR. No murmurs, rubs, gallops. Gastrointestinal system: Abdomen is nondistended, soft and nontender. No organomegaly or masses felt. Normal bowel sounds heard. Central nervous system: Alert and oriented. No focal neurological deficits. Extremities: No C/C/E, +pedal pulses Skin: No rashes, lesions or ulcers Psychiatry: Judgement and insight appear normal. Mood & affect  appropriate.    Data Reviewed: I have personally reviewed following labs and imaging studies  CBC: Recent Labs  Lab 06/05/19 1531 06/06/19 0442 06/07/19 0621 06/09/19 0358  WBC 5.3 2.5* 3.5* 2.6*  NEUTROABS  --   --   --  1.2*  HGB 10.6* 10.5* 9.8* 9.8*  HCT 33.8* 32.6* 30.7* 30.6*  MCV 91.1 90.1 90.0 89.7  PLT 100* 95* 90* 90*   Basic Metabolic Panel: Recent Labs  Lab 06/05/19 1531 06/06/19 0442 06/07/19 0621 06/09/19 0358  NA 140 140 140 138  K 3.7 4.7 3.8 3.9  CL 105 106 106 106  CO2 23 26 23 25   GLUCOSE 93 126* 92 85  BUN 27* 27* 28* 22  CREATININE 1.59* 1.36* 1.59* 1.41*  CALCIUM 9.6 9.6 9.2 9.0  MG  --   --   --  1.8  PHOS  --   --   --  3.1   GFR: Estimated Creatinine Clearance: 33.2 mL/min (A) (by C-G formula based on SCr of 1.41 mg/dL (H)). Liver Function Tests: Recent Labs  Lab 06/05/19 1531 06/09/19 0358  AST 16 12*  ALT 15 11  ALKPHOS 61 52  BILITOT 1.4* 1.0  PROT 6.8 5.6*  ALBUMIN 4.0 3.4*   No results for input(s): LIPASE, AMYLASE in the last 168 hours. No results for input(s): AMMONIA in the last 168 hours. Coagulation Profile: No results for input(s): INR, PROTIME in the last 168 hours. Cardiac Enzymes: No results for input(s): CKTOTAL, CKMB, CKMBINDEX, TROPONINI in the last 168 hours. BNP (last 3 results) No results for input(s): PROBNP in the last 8760 hours. HbA1C: No results for input(s): HGBA1C in the last 72 hours. CBG: Recent Labs  Lab 06/05/19 1525  GLUCAP 87   Lipid Profile: No results for input(s): CHOL, HDL, LDLCALC, TRIG, CHOLHDL, LDLDIRECT in the last 72 hours. Thyroid Function Tests: No results for input(s): TSH, T4TOTAL, FREET4, T3FREE, THYROIDAB in the last 72 hours. Anemia Panel: No results for input(s): VITAMINB12, FOLATE, FERRITIN, TIBC, IRON, RETICCTPCT in the last 72 hours. Sepsis Labs: No results for input(s): PROCALCITON, LATICACIDVEN in the last 168 hours.  Recent Results (from the past 240 hour(s))    SARS CORONAVIRUS 2 (TAT 6-24 HRS) Nasopharyngeal Nasopharyngeal Swab     Status: None   Collection Time: 06/06/19  2:35 AM   Specimen: Nasopharyngeal Swab  Result Value Ref Range Status   SARS Coronavirus 2 NEGATIVE NEGATIVE Final    Comment: (NOTE) SARS-CoV-2 target nucleic acids are NOT DETECTED. The SARS-CoV-2 RNA is generally detectable in upper and lower respiratory specimens during the acute phase of infection. Negative results do not preclude SARS-CoV-2 infection, do not rule out co-infections with other pathogens, and should not be used as the sole basis for treatment or other patient management decisions. Negative results must be combined with clinical observations, patient history, and epidemiological information. The expected result is Negative. Fact Sheet for Patients: SugarRoll.be Fact Sheet for Healthcare Providers: https://www.woods-mathews.com/ This test is not yet approved or cleared by the [place] FDA and  has been authorized for detection and/or diagnosis of SARS-CoV-2 by FDA under an Emergency Use Authorization (EUA). This EUA will remain  in effect (meaning this test can be used) for the duration of the COVID-19 declaration under Section 56 4(b)(1) of the Act, 21 U.S.C. section 360bbb-3(b)(1), unless the authorization is terminated or revoked sooner. Performed at [facility], [place] [address], [place], [place] [address]   Surgical pcr screen     Status: None   Collection Time: 06/09/19 10:33 AM   Specimen: Nasal Mucosa; Nasal Swab  Result Value Ref Range Status   MRSA, PCR NEGATIVE NEGATIVE Final   Staphylococcus aureus NEGATIVE NEGATIVE Final    Comment: (NOTE) The Xpert SA Assay (FDA approved for NASAL specimens in patients 89 years of age and older), is one component of a comprehensive surveillance program. It is not intended to diagnose infection nor to guide or monitor treatment. Performed at [facility], [place] [address], [place], [place] [address]     Radiology Studies: DG Thoracic Spine 2 View  Result Date: 06/09/2019 CLINICAL DATA:  T11 kyphoplasty EXAM: THORACIC SPINE 2 VIEWS COMPARISON:  06/05/2019 FINDINGS: Intraoperative spot images demonstrate single level vertebral augmentation changes, reportedly T11. No visible complicating feature. IMPRESSION: Single level vertebral augmentation as above. Electronically Signed   By: Rolm Baptise M.D.   On: 06/09/2019 19:12   DG C-Arm 1-60 Min  Result Date: 06/09/2019 CLINICAL DATA:  T11 fracture. EXAM: DG C-ARM 1-60 MIN FLUOROSCOPY TIME:  Fluoroscopy Time:  44 seconds Radiation Exposure Index (  if provided by the fluoroscopic device): Not available Number of Acquired Spot Images: 2 COMPARISON:  MRI 06/07/2019 FINDINGS: Frontal and lateral fluoroscopic spot views obtained in the operating room. Kyphoplasty within lower thoracic vertebra, presumably T11. Spinal levels not well evaluated on the current exam. Chronic compression fracture of (tentative) T9. Total fluoroscopy time 44 seconds. IMPRESSION: Intraoperative fluoroscopy for kyphoplasty. Electronically Signed   By: Keith Rake M.D.   On: 06/09/2019 19:15   Scheduled Meds: . acetaminophen  1,000 mg Oral TID  . fentaNYL      . senna  1 tablet Oral Daily  . sodium chloride flush  3 mL Intravenous Once  . sodium chloride flush  3 mL Intravenous Q12H   Continuous Infusions: . sodium chloride 250 mL (06/09/19 2004)  .  ceFAZolin (ANCEF) IV 2 g (06/09/19 2010)     LOS: 3 days   Kathie Dike, MD [facility] PAGER is on [place]  If 7PM-7AM, please contact night-coverage [email or web address]

## 2019-06-09 NOTE — TOC Initial Note (Signed)
Transition of Care [facility]) - Initial/Assessment Note    Patient Details  Name: [name] MRN: [id number] Date of Birth: May 18, 1930  Transition of Care [facility] CM/SW Contact:    Pollie Friar, RN Phone Number: 06/09/2019, 1:03 PM  Clinical Narrative:                 Pt to have Kyphoplasty today. He states that he has done really well after previous kyphoplasties and hope this will be the same.  Pt lives alone and doesn't have anyone to assist at home. CM inquired with him about the [place] that would get him some aide services in the home under his insurance. He was in agreement. Cory with [facility] updated and will follow up with the patient.  Pt with orders for rolling walker. Zack with [employer] will have it delivered to the room. TOC following for further d/c needs.  MD pt will need HH orders to include RN,aide please.  Expected Discharge Plan: [place] Barriers to Discharge: Continued Medical Work up   Patient Goals and CMS Choice   CMS Medicare.gov Compare Post Acute Care list provided to:: Patient Choice offered to / list presented to : Patient  Expected Discharge Plan and Services Expected Discharge Plan: [place]   Discharge Planning Services: CM Consult Post Acute Care Choice: Home Health, Durable Medical Equipment Living arrangements for the past 2 months: Single Family Home                 DME Arranged: Walker rolling DME Agency: [employer] Date DME Agency Contacted: 06/09/19   Representative spoke with at DME Agency: [place]: [place] Date [place]: 06/09/19   Representative spoke with at [place]: Tommi Rumps  Prior Living Arrangements/Services Living arrangements for the past 2 months: Single Family Home Lives with:: Self Patient language and need for interpreter reviewed:: Yes Do you feel safe going back to the place where you live?: Yes        Care giver support  system in place?: No (comment)   Criminal Activity/Legal Involvement Pertinent to Current Situation/Hospitalization: No - Comment as needed  Activities of Daily Living      Permission Sought/Granted                  Emotional Assessment Appearance:: Appears stated age Attitude/Demeanor/Rapport: Engaged Affect (typically observed): Accepting Orientation: : Oriented to Self, Oriented to Place, Oriented to  Time, Oriented to Situation   Psych Involvement: No (comment)  Admission diagnosis:  Low back pain [M54.5] Intractable back pain [M54.9] Acute exacerbation of chronic low back pain [M54.5, G89.29] Compression fracture of lumbar vertebra with routine healing, subsequent encounter [S32.000D] Patient Active Problem List   Diagnosis Date Noted  . Acute exacerbation of chronic low back pain 06/06/2019  . Thrombocytopenia ([place]) 06/06/2019  . Intractable back pain 06/06/2019  . COPD (chronic obstructive pulmonary disease) ([place])   . Chronic kidney insufficiency   . COPD with acute exacerbation ([place]) 05/20/2018  . COPD exacerbation ([place]) 05/19/2018  . Pulmonary nodule 05/19/2018  . Bronchiectasis ([place]) 05/19/2018  . Anemia 09/17/2017  . Abnormal CT of the abdomen 11/02/2016  . Loss of weight 11/02/2016  . History of colonic polyps 11/02/2016  . GERD (gastroesophageal reflux disease) 10/01/2015   PCP:  Sharilyn Sites, MD Pharmacy:   [facility] [address] [place] [address] [place]  [place] [address] Phone: [phone] Fax: [phone]     Social Determinants of Health (SDOH) Interventions    Readmission Risk Interventions No flowsheet data found.

## 2019-06-09 NOTE — Anesthesia Procedure Notes (Signed)
Procedure Name: Intubation Date/Time: 06/09/2019 5:42 PM Performed by: Barrington Ellison, CRNA Pre-anesthesia Checklist: Patient identified, Emergency Drugs available, Suction available and Patient being monitored Patient Re-evaluated:Patient Re-evaluated prior to induction Oxygen Delivery Method: Circle System Utilized Preoxygenation: Pre-oxygenation with 100% oxygen Induction Type: IV induction Ventilation: Mask ventilation without difficulty Laryngoscope Size: Mac and 4 Grade View: Grade I Tube type: Oral Tube size: 7.5 mm Number of attempts: 1 Airway Equipment and Method: Stylet and Oral airway Placement Confirmation: ETT inserted through vocal cords under direct vision,  positive ETCO2 and breath sounds checked- equal and bilateral Secured at: 22 cm Tube secured with: Tape Dental Injury: Teeth and Oropharynx as per pre-operative assessment

## 2019-06-09 NOTE — Anesthesia Preprocedure Evaluation (Addendum)
Anesthesia Evaluation  Patient identified by MRN, date of birth, ID band Patient awake    Reviewed: Allergy & Precautions, NPO status , Patient's Chart, lab work & pertinent test results  History of Anesthesia Complications Negative for: history of anesthetic complications  Airway Mallampati: I  TM Distance: >3 FB Neck ROM: Full    Dental  (+) Edentulous Upper, Lower Dentures, Dental Advisory Given   Pulmonary COPD, former smoker,    Pulmonary exam normal        Cardiovascular negative cardio ROS Normal cardiovascular exam     Neuro/Psych T-11 Fracture negative psych ROS   GI/Hepatic Neg liver ROS, GERD  ,  Endo/Other  negative endocrine ROS  Renal/GU Renal InsufficiencyRenal disease     Musculoskeletal negative musculoskeletal ROS (+)   Abdominal   Peds  Hematology negative hematology ROS (+)   Anesthesia Other Findings Day of surgery medications reviewed with the patient.  Reproductive/Obstetrics                            Anesthesia Physical Anesthesia Plan  ASA: III  Anesthesia Plan: General   Post-op Pain Management:    Induction: Intravenous  PONV Risk Score and Plan: 2 and Ondansetron and Dexamethasone  Airway Management Planned: Oral ETT  Additional Equipment:   Intra-op Plan:   Post-operative Plan: Extubation in OR  Informed Consent: I have reviewed the patients History and Physical, chart, labs and discussed the procedure including the risks, benefits and alternatives for the proposed anesthesia with the patient or authorized representative who has indicated his/her understanding and acceptance.     Dental advisory given  Plan Discussed with: CRNA and Anesthesiologist  Anesthesia Plan Comments:        Anesthesia Quick Evaluation

## 2019-06-09 NOTE — Anesthesia Postprocedure Evaluation (Signed)
Anesthesia Post Note  Patient: [name]  Procedure(s) Performed: T11 KYPHOPLASTY (Bilateral )     Patient location during evaluation: PACU Anesthesia Type: General Level of consciousness: awake and alert Pain management: pain level controlled Vital Signs Assessment: post-procedure vital signs reviewed and stable Respiratory status: spontaneous breathing, nonlabored ventilation, respiratory function stable and patient connected to nasal cannula oxygen Cardiovascular status: blood pressure returned to baseline and stable Postop Assessment: no apparent nausea or vomiting Anesthetic complications: no    Last Vitals:  Vitals:   06/09/19 1915 06/09/19 2014  BP: 135/73 (!) 115/59  Pulse: 68 71  Resp: 11 17  Temp:  36.8 C  SpO2: 100% 97%    Last Pain:  Vitals:   06/09/19 2014  TempSrc: Oral  PainSc:                  Webb Weed DAVID

## 2019-06-09 NOTE — Transfer of Care (Signed)
Immediate Anesthesia Transfer of Care Note  Patient: [name]  Procedure(s) Performed: T11 KYPHOPLASTY (Bilateral )  Patient Location: PACU  Anesthesia Type:General  Level of Consciousness: awake  Airway & Oxygen Therapy: Patient Spontanous Breathing and Patient connected to face mask oxygen  Post-op Assessment: Report given to RN  Post vital signs: Reviewed and stable  Last Vitals:  Vitals Value Taken Time  BP 138/80 06/09/19 1830  Temp    Pulse 65 06/09/19 1830  Resp 18 06/09/19 1830  SpO2 98 % 06/09/19 1830  Vitals shown include unvalidated device data.  Last Pain:  Vitals:   06/09/19 1513  TempSrc: Oral  PainSc:          Complications: No apparent anesthesia complications

## 2019-06-09 NOTE — Progress Notes (Signed)
PT Cancellation Note  Patient Details Name: [name] MRN: [id number] DOB: 04/14/1930   Cancelled Treatment:    Reason Eval/Treat Not Completed: Pain limiting ability to participate (pt politely declining therapy services secondary to pain (RN notified); plan for kyphoplasty today).  Ellamae Sia, PT, DPT Acute Rehabilitation Services Pager [phone] Office [phone]    Willy Eddy 06/09/2019, 1:24 PM

## 2019-06-10 LAB — CBC
HCT: 29.6 % — ABNORMAL LOW (ref 39.0–52.0)
Hemoglobin: 9.6 g/dL — ABNORMAL LOW (ref 13.0–17.0)
MCH: 28.8 pg (ref 26.0–34.0)
MCHC: 32.4 g/dL (ref 30.0–36.0)
MCV: 88.9 fL (ref 80.0–100.0)
Platelets: 92 10*3/uL — ABNORMAL LOW (ref 150–400)
RBC: 3.33 MIL/uL — ABNORMAL LOW (ref 4.22–5.81)
RDW: 19.7 % — ABNORMAL HIGH (ref 11.5–15.5)
WBC: 2.1 10*3/uL — ABNORMAL LOW (ref 4.0–10.5)
nRBC: 0 % (ref 0.0–0.2)

## 2019-06-10 LAB — BASIC METABOLIC PANEL
Anion gap: 8 (ref 5–15)
BUN: 29 mg/dL — ABNORMAL HIGH (ref 8–23)
CO2: 25 mmol/L (ref 22–32)
Calcium: 9.1 mg/dL (ref 8.9–10.3)
Chloride: 105 mmol/L (ref 98–111)
Creatinine, Ser: 1.95 mg/dL — ABNORMAL HIGH (ref 0.61–1.24)
GFR calc Af Amer: 35 mL/min — ABNORMAL LOW (ref >60)
GFR calc non Af Amer: 30 mL/min — ABNORMAL LOW (ref >60)
Glucose, Bld: 126 mg/dL — ABNORMAL HIGH (ref 70–99)
Potassium: 4.4 mmol/L (ref 3.5–5.1)
Sodium: 138 mmol/L (ref 135–145)

## 2019-06-10 LAB — PROTIME-INR
INR: 1 (ref 0.8–1.2)
Prothrombin Time: 13.5 seconds (ref 11.4–15.2)

## 2019-06-10 LAB — APTT: aPTT: 30 seconds (ref 24–36)

## 2019-06-10 MED ORDER — CYCLOBENZAPRINE HCL 10 MG PO TABS
5.0000 mg | ORAL_TABLET | Freq: Three times a day (TID) | ORAL | Status: DC | PRN
  Administered 2019-06-10: 5 mg via ORAL
  Filled 2019-06-10: qty 1

## 2019-06-10 MED ORDER — CALCIUM CARBONATE ANTACID 500 MG PO CHEW
400.0000 mg | CHEWABLE_TABLET | Freq: Once | ORAL | Status: AC
  Administered 2019-06-10: 400 mg via ORAL
  Filled 2019-06-10: qty 2

## 2019-06-10 MED ORDER — LIDOCAINE 5 % EX PTCH
1.0000 | MEDICATED_PATCH | CUTANEOUS | Status: DC
  Administered 2019-06-10 – 2019-06-12 (×2): 1 via TRANSDERMAL
  Filled 2019-06-10 (×3): qty 1

## 2019-06-10 MED ORDER — SODIUM CHLORIDE 0.45 % IV SOLN: INTRAVENOUS | Status: DC

## 2019-06-10 NOTE — Progress Notes (Signed)
Physical Therapy Treatment  (Re eval not needed as pt function not significantly changed since kyphoplasty)  Patient Details Name: [name] MRN: [id number] DOB: Sep 28, 1929 Today's Date: 06/10/2019    History of Present Illness 86 y.o. male 1 month s/p L2 vertebral augmentation with recurrent, severe lower back pain that interferes with ADLs since a lifting injury several days ago. He is neurologically intact. MRI T and L spine reviewed. Primary findings at T11 where there is an acute compression fracture. Underwent kyphoplasty T11 06/09/19.    PT Comments    Pt tolerating mobility as before kyphoplasty but does not have pain relief since procedure, 6/10 at rest, increasing with activity. Ambulated 200' with RW and min-guard A. Needs min A to don TLSO. SpO2 dropped to 87% on RA, 2L replaced after session and spO2 increased to 93% (pt noted that at home he takes spiriva but is not on O2). Discussed d/c plan in light of pain and pt texted with daughter who confirmed that he can come to her house in [place] while he recovers. Recommend HHPT while he is there. PT will continue to follow.   Follow Up Recommendations  Home health PT;Supervision - Intermittent     Equipment Recommendations  Rolling walker with 5" wheels    Recommendations for Other Services       Precautions / Restrictions Precautions Precautions: Back;Fall Precaution Booklet Issued: No Precaution Comments: reviewed precautions Required Braces or Orthoses: Spinal Brace Spinal Brace: Thoracolumbosacral orthotic;Applied in sitting position(min A to don) Restrictions Weight Bearing Restrictions: No    Mobility  Bed Mobility Overal bed mobility: Needs Assistance Bed Mobility: Supine to Sit;Sit to Supine     Supine to sit: HOB elevated;Supervision Sit to supine: Supervision;HOB elevated   General bed mobility comments: pt tends to pull on rails to sit self straight up for pain relief  Transfers Overall transfer  level: Needs assistance Equipment used: Rolling walker (2 wheeled) Transfers: Sit to/from Stand Sit to Stand: Supervision         General transfer comment: no physical assist needed for standing, supervision for safety  Ambulation/Gait Ambulation/Gait assistance: Min guard Gait Distance (Feet): 200 Feet Assistive device: Rolling walker (2 wheeled) Gait Pattern/deviations: Step-through pattern;Decreased stride length;Trunk flexed Gait velocity: decreased Gait velocity interpretation: <1.8 ft/sec, indicate of risk for recurrent falls General Gait Details: vc's for abdominal activation to attempt more erect posture with less pain. Pt can maintain nearly erect for short term then returns to trunk flexion   Stairs             Wheelchair Mobility    Modified Rankin (Stroke Patients Only)       Balance Overall balance assessment: Mild deficits observed, not formally tested                                          Cognition Arousal/Alertness: Awake/alert Behavior During Therapy: WFL for tasks assessed/performed Overall Cognitive Status: Within Functional Limits for tasks assessed                                        Exercises      General Comments General comments (skin integrity, edema, etc.): given continued pain discussed d/c plan and pt reports he will go to his daughter's home in [place]. He texted  with her during session and received confirmation from her that this would work      Pertinent Vitals/Pain Pain Assessment: Faces Faces Pain Scale: Hurts even more Pain Location: mid back Pain Descriptors / Indicators: Other (Comment);Constant Pain Intervention(s): Limited activity within patient's tolerance;Monitored during session    Home Living                      Prior Function            PT Goals (current goals can now be found in the care plan section) Acute Rehab PT Goals Patient Stated Goal: to have less  pain PT Goal Formulation: With patient Time For Goal Achievement: 06/22/19 Potential to Achieve Goals: Good Progress towards PT goals: Progressing toward goals    Frequency    Min 3X/week      PT Plan Current plan remains appropriate    Co-evaluation              AM-PAC PT "6 Clicks" Mobility   Outcome Measure  Help needed turning from your back to your side while in a flat bed without using bedrails?: None Help needed moving from lying on your back to sitting on the side of a flat bed without using bedrails?: A Little Help needed moving to and from a bed to a chair (including a wheelchair)?: A Little Help needed standing up from a chair using your arms (e.g., wheelchair or bedside chair)?: A Little Help needed to walk in hospital room?: A Little Help needed climbing 3-5 steps with a railing? : A Lot 6 Click Score: 18    End of Session Equipment Utilized During Treatment: Gait belt;Back brace Activity Tolerance: Patient limited by pain Patient left: in bed;with call bell/phone within reach;with bed alarm set Nurse Communication: Mobility status PT Visit Diagnosis: Pain;Difficulty in walking, not elsewhere classified (R26.2) Pain - part of body: (back)     Time: 6754-4920 PT Time Calculation (min) (ACUTE ONLY): 36 min  Charges:  $Gait Training: 23-37 mins                     Leighton Roach, [place]  Pager [phone] Office [place] 06/10/2019, 10:28 AM

## 2019-06-10 NOTE — Progress Notes (Signed)
Patient ID: [name], male   DOB: 1929-07-21, 86 y.o.   MRN: [id number]  PROGRESS NOTE    [name]  FAO:[id number] DOB: 10-Dec-1929 DOA: 06/05/2019 PCP: Sharilyn Sites, [place]   Brief Narrative:  HPI per Gean Birchwood on 06/06/2019 [name] is a 86 y.o. male with history of COPD, MDS was recently admitted 3 weeks ago for COPD/bronchiectasis exacerbation presents to the ER with worsening low back pain for the third time in last 1 week.  Patient's pain started about a week ago which has been constant with no radiation mostly in the low back.  Has had kyphoplasty previously by Dr. Kathyrn Sheriff neurosurgeon.  Denies any incontinence of urine bowel fever chills or any weakness of the extremities.  Pain does not radiate to the lower extremities.  ED Course: In the ER patient had x-rays which did not show any acute fractures.  As a CT lumbar spine during last visit few days ago which also did not show any acute fractures.  Does have chronic lumbar and thoracic fractures.  Patient has benign persistent pain despite multiple dose of pain relief medication and has been admitted for further management.  ER physician has discussed with Dr. Venetia Constable on-call neurosurgery will be seeing patient in consult.   Assessment & Plan:   Principal Problem:   Acute exacerbation of chronic low back pain Active Problems:   GERD (gastroesophageal reflux disease)   Anemia   Pulmonary nodule   Thrombocytopenia (HCC)   Chronic kidney insufficiency   Intractable back pain  Acute Exacerbation of Chronic Low Back pain in the setting of Acute T11 Compression Fracture - Evaluated by neurosurgery who opine current imaging shows chronic compression fractures and questionable acute T12 compression fracture.   -MRI Thoracic and Lumbar spine -done see results. Still no relief of pain-will add Flexeril and lidocaine patch to current pain med -C/w TLSO Brace for the patient  -PT/OT and ok to Ambulate with TLSO  per Neurosurgery -Home health PT -Patient underwent status post T11 kyphoplasty on 1/22.   Anticipate continued physical therapy and pain management    Acute on Chronic Kidney Disease Stage IIIa.  -Chart review indicates nephrologist in [place].   -Creatinine baseline 1.6-2.0 range.   -likely mildly prerenal , will add ivf gentle hydration -monitor labs -Avoid Nephrotoxic Medications, Contrast Dyes, Hypotension and Renally Adjust Medications   COPD.  Not on home oxygen.   -Home medications include albuterol inhaler.  Stable at baseline -Takes Spiriva as well but not listed on his home MAR so we will need to reach out to pharmacy to add -Postoperatively, he was continued on oxygen.  ..>now weaning off.   History of MDS. Pancytopenia   -Lab work reveals anemia and thrombocytopenia and Leukopenia.   -Chart review indicates current hemoglobin of 10.5 is close to his baseline.   -In addition chart review indicates platelet count of 95 is a little less than baseline. -Patient's WBC was 2.6, Hb/Hct wasd 9.8/30.6, and Platelet Count was 90,000 -Sees Hematology in [place] -Continue to Monitor for S/Sx of Bleeding Currently h/h stable.  DVT prophylaxis: SCD  Code Status: Full Family Communication: None at bedside  Disposition Plan: Anticipate discharge home in the next 24-48hrs if pain is better controlled.  Marland Kitchen  He will need home health PT and OT.  Consultants:   Neurosurgery   Procedures: MRI, T11 kyphoplasty  Antimicrobials:  Anti-infectives (From admission, onward)   Start     Dose/Rate Route Frequency Ordered Stop  06/09/19 1945  ceFAZolin (ANCEF) IVPB 2g/100 mL premix     2 g 200 mL/hr over 30 Minutes Intravenous Every 8 hours 06/09/19 1935 06/10/19 0330   06/09/19 0800  ceFAZolin (ANCEF) IVPB 2g/100 mL premix     2 g 200 mL/hr over 30 Minutes Intravenous 30 min pre-op 06/09/19 1062 06/09/19 1753     Subjective: Patient complaining of back pain.  No other new  complaints.  Objective: Vitals:   06/10/19 0404 06/10/19 0410 06/10/19 0742 06/10/19 1236  BP: 110/67  127/79 97/66  Pulse: 75  75 68  Resp: 18  20 17   Temp: 98.2 F (36.8 C)  98.4 F (36.9 C) 98 F (36.7 C)  TempSrc: Oral  Oral Oral  SpO2: 94% 98% 98% 99%  Weight:      Height:        Intake/Output Summary (Last 24 hours) at 06/10/2019 1304 Last data filed at 06/10/2019 0348 Gross per 24 hour  Intake 503.66 ml  Output 2 ml  Net 501.66 ml   Filed Weights   06/09/19 1655  Weight: 63.5 kg    Examination: Physical Exam:  General exam: NAD, looks tired, laying in bed Respiratory system: Clear to auscultation, no wheeze rales rhonchi's  Cardiovascular system:RRR. No murmurs, rubs, gallops. Gastrointestinal system: Abdomen is nondistended, soft and nontender. No organomegaly or masses felt. Normal bowel sounds heard. Central nervous system: Alert and oriented. No focal neurological deficits. Extremities: No edema Skin: Warm dry Psychiatry: Judgement and insight appear normal. Mood & affect appropriate.    Data Reviewed: I have personally reviewed following labs and imaging studies  CBC: Recent Labs  Lab 06/05/19 1531 06/06/19 0442 06/07/19 0621 06/09/19 0358 06/10/19 0222  WBC 5.3 2.5* 3.5* 2.6* 2.1*  NEUTROABS  --   --   --  1.2*  --   HGB 10.6* 10.5* 9.8* 9.8* 9.6*  HCT 33.8* 32.6* 30.7* 30.6* 29.6*  MCV 91.1 90.1 90.0 89.7 88.9  PLT 100* 95* 90* 90* 92*   Basic Metabolic Panel: Recent Labs  Lab 06/05/19 1531 06/06/19 0442 06/07/19 0621 06/09/19 0358 06/10/19 0222  NA 140 140 140 138 138  K 3.7 4.7 3.8 3.9 4.4  CL 105 106 106 106 105  CO2 23 26 23 25 25   GLUCOSE 93 126* 92 85 126*  BUN 27* 27* 28* 22 29*  CREATININE 1.59* 1.36* 1.59* 1.41* 1.95*  CALCIUM 9.6 9.6 9.2 9.0 9.1  MG  --   --   --  1.8  --   PHOS  --   --   --  3.1  --    GFR: Estimated Creatinine Clearance: 24 mL/min (A) (by C-G formula based on SCr of 1.95 mg/dL (H)). Liver  Function Tests: Recent Labs  Lab 06/05/19 1531 06/09/19 0358  AST 16 12*  ALT 15 11  ALKPHOS 61 52  BILITOT 1.4* 1.0  PROT 6.8 5.6*  ALBUMIN 4.0 3.4*   No results for input(s): LIPASE, AMYLASE in the last 168 hours. No results for input(s): AMMONIA in the last 168 hours. Coagulation Profile: Recent Labs  Lab 06/10/19 0222  INR 1.0   Cardiac Enzymes: No results for input(s): CKTOTAL, CKMB, CKMBINDEX, TROPONINI in the last 168 hours. BNP (last 3 results) No results for input(s): PROBNP in the last 8760 hours. HbA1C: No results for input(s): HGBA1C in the last 72 hours. CBG: Recent Labs  Lab 06/05/19 1525  GLUCAP 87   Lipid Profile: No results for input(s): CHOL, HDL, LDLCALC,  TRIG, CHOLHDL, LDLDIRECT in the last 72 hours. Thyroid Function Tests: No results for input(s): TSH, T4TOTAL, FREET4, T3FREE, THYROIDAB in the last 72 hours. Anemia Panel: No results for input(s): VITAMINB12, FOLATE, FERRITIN, TIBC, IRON, RETICCTPCT in the last 72 hours. Sepsis Labs: No results for input(s): PROCALCITON, LATICACIDVEN in the last 168 hours.  Recent Results (from the past 240 hour(s))  SARS CORONAVIRUS 2 (TAT 6-24 HRS) Nasopharyngeal Nasopharyngeal Swab     Status: None   Collection Time: 06/06/19  2:35 AM   Specimen: Nasopharyngeal Swab  Result Value Ref Range Status   SARS Coronavirus 2 NEGATIVE NEGATIVE Final    Comment: (NOTE) SARS-CoV-2 target nucleic acids are NOT DETECTED. The SARS-CoV-2 RNA is generally detectable in upper and lower respiratory specimens during the acute phase of infection. Negative results do not preclude SARS-CoV-2 infection, do not rule out co-infections with other pathogens, and should not be used as the sole basis for treatment or other patient management decisions. Negative results must be combined with clinical observations, patient history, and epidemiological information. The expected result is Negative. Fact Sheet for  Patients: SugarRoll.be Fact Sheet for Healthcare Providers: https://www.woods-mathews.com/ This test is not yet approved or cleared by the [place] FDA and  has been authorized for detection and/or diagnosis of SARS-CoV-2 by FDA under an Emergency Use Authorization (EUA). This EUA will remain  in effect (meaning this test can be used) for the duration of the COVID-19 declaration under Section 56 4(b)(1) of the Act, 21 U.S.C. section 360bbb-3(b)(1), unless the authorization is terminated or revoked sooner. Performed at [facility], [place] [address] [place], [place] [address]   Surgical pcr screen     Status: None   Collection Time: 06/09/19 10:33 AM   Specimen: Nasal Mucosa; Nasal Swab  Result Value Ref Range Status   MRSA, PCR NEGATIVE NEGATIVE Final   Staphylococcus aureus NEGATIVE NEGATIVE Final    Comment: (NOTE) The Xpert SA Assay (FDA approved for NASAL specimens in patients 62 years of age and older), is one component of a comprehensive surveillance program. It is not intended to diagnose infection nor to guide or monitor treatment. Performed at [facility], [place] [address], [place], [place] [address]     Radiology Studies: DG Thoracic Spine 2 View  Result Date: 06/09/2019 CLINICAL DATA:  T11 kyphoplasty EXAM: THORACIC SPINE 2 VIEWS COMPARISON:  06/05/2019 FINDINGS: Intraoperative spot images demonstrate single level vertebral augmentation changes, reportedly T11. No visible complicating feature. IMPRESSION: Single level vertebral augmentation as above. Electronically Signed   By: Rolm Baptise M.D.   On: 06/09/2019 19:12   DG C-Arm 1-60 Min  Result Date: 06/09/2019 CLINICAL DATA:  T11 fracture. EXAM: DG C-ARM 1-60 MIN FLUOROSCOPY TIME:  Fluoroscopy Time:  44 seconds Radiation Exposure Index (if provided by the fluoroscopic device): Not available Number of Acquired Spot Images: 2 COMPARISON:  MRI 06/07/2019 FINDINGS:  Frontal and lateral fluoroscopic spot views obtained in the operating room. Kyphoplasty within lower thoracic vertebra, presumably T11. Spinal levels not well evaluated on the current exam. Chronic compression fracture of (tentative) T9. Total fluoroscopy time 44 seconds. IMPRESSION: Intraoperative fluoroscopy for kyphoplasty. Electronically Signed   By: Keith Rake M.D.   On: 06/09/2019 19:15   Scheduled Meds: . acetaminophen  1,000 mg Oral TID  . senna  1 tablet Oral Daily  . sodium chloride flush  3 mL Intravenous Once  . sodium chloride flush  3 mL Intravenous Q12H   Continuous Infusions: . sodium  chloride Stopped (06/10/19 0549)     LOS: 4 days   Time spent 45 minutes with more than 50% COC    Nolberto Hanlon, MD [facility] PAGER is on AMION  If 7PM-7AM, please contact night-coverage [email or web address]

## 2019-06-10 NOTE — Progress Notes (Signed)
Subjective: Patient reports moderate amount of back pain still. Denies any radicular pain.   Objective: Vital signs in last 24 hours: Temp:  [98.2 F (36.8 C)-98.8 F (37.1 C)] 98.2 F (36.8 C) (01/23 0404) Pulse Rate:  [65-79] 75 (01/23 0404) Resp:  [11-21] 18 (01/23 0404) BP: (108-146)/(59-80) 110/67 (01/23 0404) SpO2:  [91 %-100 %] 98 % (01/23 0410) Weight:  [63.5 kg] 63.5 kg (01/22 1655)  Intake/Output from previous day: 01/22 0701 - 01/23 0700 In: 503.7 [I.V.:303.7; IV Piggyback:200] Out: 2 [Blood:2] Intake/Output this shift: No intake/output data recorded.  Neurologic: Grossly normal  Lab Results: Lab Results  Component Value Date   WBC 2.1 (L) 06/10/2019   HGB 9.6 (L) 06/10/2019   HCT 29.6 (L) 06/10/2019   MCV 88.9 06/10/2019   PLT 92 (L) 06/10/2019   Lab Results  Component Value Date   INR 1.0 06/10/2019   BMET Lab Results  Component Value Date   NA 138 06/10/2019   K 4.4 06/10/2019   CL 105 06/10/2019   CO2 25 06/10/2019   GLUCOSE 126 (H) 06/10/2019   BUN 29 (H) 06/10/2019   CREATININE 1.95 (H) 06/10/2019   CALCIUM 9.1 06/10/2019    Studies/Results: DG Thoracic Spine 2 View  Result Date: 06/09/2019 CLINICAL DATA:  T11 kyphoplasty EXAM: THORACIC SPINE 2 VIEWS COMPARISON:  06/05/2019 FINDINGS: Intraoperative spot images demonstrate single level vertebral augmentation changes, reportedly T11. No visible complicating feature. IMPRESSION: Single level vertebral augmentation as above. Electronically Signed   By: Rolm Baptise M.D.   On: 06/09/2019 19:12   DG C-Arm 1-60 Min  Result Date: 06/09/2019 CLINICAL DATA:  T11 fracture. EXAM: DG C-ARM 1-60 MIN FLUOROSCOPY TIME:  Fluoroscopy Time:  44 seconds Radiation Exposure Index (if provided by the fluoroscopic device): Not available Number of Acquired Spot Images: 2 COMPARISON:  MRI 06/07/2019 FINDINGS: Frontal and lateral fluoroscopic spot views obtained in the operating room. Kyphoplasty within lower thoracic  vertebra, presumably T11. Spinal levels not well evaluated on the current exam. Chronic compression fracture of (tentative) T9. Total fluoroscopy time 44 seconds. IMPRESSION: Intraoperative fluoroscopy for kyphoplasty. Electronically Signed   By: Keith Rake M.D.   On: 06/09/2019 19:15    Assessment/Plan: Continue pain management and therapies today. Ok to discharge from nsgy perspective as long as pain is under control.    LOS: 4 days    Ocie Cornfield [facility] 06/10/2019, 9:13 AM

## 2019-06-10 NOTE — Evaluation (Signed)
Occupational Therapy Evaluation Patient Details Name: [name] MRN: [id number] DOB: [birth year] Today's Date: 06/10/2019    History of Present Illness 86 y.o. male 1 month s/p L2 vertebral augmentation with recurrent, severe lower back pain that interferes with ADLs since a lifting injury several days ago. He is neurologically intact. MRI T and L spine reviewed. Primary findings at T11 where there is an acute compression fracture. Underwent kyphoplasty T11 06/09/19.   Clinical Impression   Patient is s/p see above surgery resulting in the deficits listed below (see OT Problem List). Patient now is planning to go to daughter's house but [place]. Patient reported that it is handicap accessible. Patient with activity 02 ranged from 87-93 % on 2L [place]. Patient was unable to report precautions and had to educate in session. Patient required min assist to don brace and no assist to doff. Patient required min guard with standing ADLS for safety.  Patient will benefit from skilled OT to increase their safety and independence with ADL and functional mobility for ADL (while adhering to their precautions) to facilitate discharge to venue listed below.       Follow Up Recommendations  Home health OT;Supervision - Intermittent    Equipment Recommendations  None recommended by OT    Recommendations for Other Services       Precautions / Restrictions Precautions Precautions: Back;Fall Precaution Booklet Issued: No Precaution Comments: reviewed precautions Required Braces or Orthoses: Spinal Brace Spinal Brace: Thoracolumbosacral orthotic;Applied in sitting position Restrictions Weight Bearing Restrictions: No      Mobility Bed Mobility Overal bed mobility: Needs Assistance Bed Mobility: Supine to Sit;Sit to Supine     Supine to sit: HOB elevated;Supervision Sit to supine: Supervision;HOB elevated   General bed mobility comments: cues on how to long  roll  Transfers Overall transfer level: Needs assistance Equipment used: Rolling walker (2 wheeled) Transfers: Sit to/from Stand Sit to Stand: Supervision         General transfer comment: no physical assist needed for standing, supervision for safety    Balance Overall balance assessment: Mild deficits observed, not formally tested                                         ADL either performed or assessed with clinical judgement   ADL Overall ADL's : Needs assistance/impaired Eating/Feeding: Independent;Sitting   Grooming: Wash/dry hands;Wash/dry face;Oral care;Supervision/safety;Min guard;Standing Grooming Details (indicate cue type and reason): Intermittent min guard to supervision during standing.  Providing cues for adherence to back precautions to decrease bending while at sink Upper Body Bathing: Supervision/ safety;Sitting   Lower Body Bathing: Min guard;Sit to/from stand   Upper Body Dressing : Supervision/safety;Sitting   Lower Body Dressing: Min guard;Sit to/from stand   Toilet Transfer: Min guard;RW;Cueing for Office manager Details (indicate cue type and reason): Cues for safety and adherence to back precautions during mobility Toileting- Clothing Manipulation and Hygiene: Supervision/safety   Tub/ Shower Transfer: Walk-in shower;Min guard   Functional mobility during ADLs: Min guard;Rolling walker General ADL Comments: Min guard with RW with mobility in room, completing sit > stand and hygiene from toilet with supervision.  Pt reports increased pain with ambulation with TLSO donned.     Vision Baseline Vision/History: Wears glasses Wears Glasses: At all times Patient Visual Report: No change from baseline Vision Assessment?: No apparent visual deficits  Perception Perception Perception Tested?: No   Praxis Praxis Praxis tested?: Not tested    Pertinent Vitals/Pain Pain Assessment: 0-10 Pain Score: 6  Faces Pain Scale:  Hurts even more Pain Location: mid back Pain Descriptors / Indicators: Other (Comment);Constant Pain Intervention(s): Monitored during session;Repositioned     Hand Dominance Right   Extremity/Trunk Assessment Upper Extremity Assessment Upper Extremity Assessment: Overall WFL for tasks assessed   Lower Extremity Assessment Lower Extremity Assessment: Defer to PT evaluation   Cervical / Trunk Assessment Cervical / Trunk Assessment: Kyphotic   Communication Communication Communication: No difficulties   Cognition Arousal/Alertness: Awake/alert Behavior During Therapy: WFL for tasks assessed/performed Overall Cognitive Status: Within Functional Limits for tasks assessed                                     General Comments  given continued pain discussed d/c plan and pt reports he will go to his daughter's home in [place]. He texted with her during session and received confirmation from her that this would work    Exercises     Shoulder Instructions      Home Living Family/patient expects to be discharged to:: Private residence Living Arrangements: Alone Available Help at Discharge: Neighbor Type of Home: House Home Access: Stairs to enter Technical brewer of Steps: 3   Home Layout: One level     Bathroom Shower/Tub: Occupational psychologist: Handicapped height Bathroom Accessibility: Yes How Accessible: Accessible via walker Home Equipment: Grab bars - tub/shower;Shower seat;Cane - single point   Additional Comments: However, now they are changing plans to go to there daughter's home and reported no steps to enter and all handicap      Prior Functioning/Environment Level of Independence: Independent        Comments: driving, grocery shopping        OT Problem List: Decreased range of motion;Decreased activity tolerance;Decreased knowledge of precautions;Pain      OT Treatment/Interventions: Self-care/ADL training;Therapeutic  activities;Patient/family education;Balance training;Energy conservation    OT Goals(Current goals can be found in the care plan section) Acute Rehab OT Goals Patient Stated Goal: to have less pain OT Goal Formulation: With patient Time For Goal Achievement: 06/22/19 Potential to Achieve Goals: Good  OT Frequency: Min 2X/week   Barriers to D/C: Decreased caregiver support          Co-evaluation PT/OT/SLP Co-Evaluation/Treatment: Yes            AM-PAC OT "6 Clicks" Daily Activity     Outcome Measure Help from another person eating meals?: None Help from another person taking care of personal grooming?: A Little Help from another person toileting, which includes using toliet, bedpan, or urinal?: A Little Help from another person bathing (including washing, rinsing, drying)?: A Little Help from another person to put on and taking off regular upper body clothing?: A Little Help from another person to put on and taking off regular lower body clothing?: A Little 6 Click Score: 19   End of Session Equipment Utilized During Treatment: Gait belt;Rolling walker;Back brace Nurse Communication: Mobility status  Activity Tolerance: Patient limited by pain Patient left: in bed;with call bell/phone within reach  OT Visit Diagnosis: Unsteadiness on feet (R26.81);Muscle weakness (generalized) (M62.81);Pain                Time: 6144-3154 OT Time Calculation (min): 19 min Charges:  OT General Charges $OT Visit: 1 Visit  OT Evaluation $OT Eval Low Complexity: [place] OTR/L  Acute Rehab Services  [phone] office number [phone] pager number   Joeseph Amor 06/10/2019, 1:14 PM

## 2019-06-11 LAB — BASIC METABOLIC PANEL
Anion gap: 6 (ref 5–15)
BUN: 23 mg/dL (ref 8–23)
CO2: 28 mmol/L (ref 22–32)
Calcium: 9.1 mg/dL (ref 8.9–10.3)
Chloride: 105 mmol/L (ref 98–111)
Creatinine, Ser: 1.17 mg/dL (ref 0.61–1.24)
GFR calc Af Amer: >60 mL/min (ref >60)
GFR calc non Af Amer: 56 mL/min — ABNORMAL LOW (ref >60)
Glucose, Bld: 92 mg/dL (ref 70–99)
Potassium: 4.1 mmol/L (ref 3.5–5.1)
Sodium: 139 mmol/L (ref 135–145)

## 2019-06-11 MED ORDER — OXYCODONE HCL 5 MG PO TABS
10.0000 mg | ORAL_TABLET | ORAL | Status: DC | PRN
  Administered 2019-06-11 – 2019-06-12 (×3): 10 mg via ORAL
  Filled 2019-06-11 (×3): qty 2

## 2019-06-11 MED ORDER — CYCLOBENZAPRINE HCL 5 MG PO TABS
7.5000 mg | ORAL_TABLET | Freq: Three times a day (TID) | ORAL | Status: DC | PRN
  Filled 2019-06-11: qty 1.5

## 2019-06-11 MED ORDER — CALCIUM CARBONATE ANTACID 500 MG PO CHEW
1.0000 | CHEWABLE_TABLET | Freq: Once | ORAL | Status: AC
  Administered 2019-06-11: 200 mg via ORAL
  Filled 2019-06-11: qty 1

## 2019-06-11 NOTE — Op Note (Addendum)
  NEUROSURGERY OPERATIVE NOTE   PREOP DIAGNOSIS: T11 compression fx   POSTOP DIAGNOSIS: Same  PROCEDURE: 1. T11 Kyphoplasty  SURGEON: Dr. Consuella Lose, [place]  ASSISTANT: Ferne Reus, PA-C  ANESTHESIA: GETA  EBL: Minimal  SPECIMENS: None  DRAINS: None  COMPLICATIONS: None immediate  CONDITION: Hemodynamically stable to PCAU  HISTORY: [name] is a 86 y.o. yo male admitted for pain control after about 3 weeks of severe back pain initiated after trying to pick up a 40ft long 2x4 piece of wood. He underwent L2 kyphoplasty about a week prior to that with significant iimprovement. Severe pain limited normal ADLs and he was admitted for pain control. Imaging demonstrated acute/subacute T11 compression fracture. He therefore elected to undergo repeat kyphoplasty at T11 in an attempt to improve pain. Risks, benefits, and alternatives were reviewed in detail with the patient and his daughter. All questions were answered and consent was obtained.  PROCEDURE IN DETAIL: After informed consent was obtained and witnessed, the patient was brought to the operating room. After induction of anesthesia, the patient was positioned on the operative table in the prone position. All pressure points were meticulously padded. Fluoroscopy was used to mark out the projection of the T11 pedicles on the skin. Skin incision was then marked out and prepped and draped in the usual sterile fashion.  After time out was conducted, bilateral stab incisions were made and Jamshidi needles were introduced. Under AP and lateral fluoroscopic guidance, bilateral T11 pedicles were reliably identified and canulated. The previous L2 kyphoplasty was easily identified and used as a landmark. Drill was then used to create a channel and the kyphoplasty balloon was placed and expanded to create a cavity. Approximately 3cc of PMMA cement was injected in each side under fluoroscopy for a total of 6cc, taking care to  preserve the posterior cortex. Trocars were then replaced and the Jamshidi needles removed.  Stab incisions were then closed with 3-0 vicryl suture and standard skin glue. The patient was then transferred to the stretcher and taken to the PACU in stable hemodynamic condition.  At the end of the case all sponge, needle, and instrument counts were correct.

## 2019-06-11 NOTE — Progress Notes (Signed)
Patient ID: [name], male   DOB: [birth year], 86 y.o.   MRN: [id number]  PROGRESS NOTE    [name]  DVV:[id number] DOB: 09/04/1929 DOA: 06/05/2019 PCP: Sharilyn Sites, [place]   Brief Narrative:  HPI per Gean Birchwood on 06/06/2019 [name] is a 86 y.o. male with history of COPD, MDS was recently admitted 3 weeks ago for COPD/bronchiectasis exacerbation presents to the ER with worsening low back pain for the third time in last 1 week.  Patient's pain started about a week ago which has been constant with no radiation mostly in the low back.  Has had kyphoplasty previously by Dr. Kathyrn Sheriff neurosurgeon.  Denies any incontinence of urine bowel fever chills or any weakness of the extremities.  Pain does not radiate to the lower extremities.  ED Course: In the ER patient had x-rays which did not show any acute fractures.  As a CT lumbar spine during last visit few days ago which also did not show any acute fractures.  Does have chronic lumbar and thoracic fractures.  Patient has benign persistent pain despite multiple dose of pain relief medication and has been admitted for further management.  ER physician has discussed with Dr. Venetia Constable on-call neurosurgery will be seeing patient in consult.  He had kyphoplasty 1/22 is being follow by NSG but has continued pain, he is ambulating but requiring IV medictions for pain control still.    Assessment & Plan:   Principal Problem:   Acute exacerbation of chronic low back pain Active Problems:   GERD (gastroesophageal reflux disease)   Anemia   Pulmonary nodule   Thrombocytopenia (HCC)   Chronic kidney insufficiency   Intractable back pain  Acute Exacerbation of Chronic Low Back pain in the setting of Acute T11 Compression Fracture - Evaluated by neurosurgery who opine current imaging shows chronic compression fractures and questionable acute T11 compression fracture.   -MRI Thoracic and Lumbar spine  Still no relief of  pain-today increase oxy (5 is his chronic home dose) as well as flexeril -C/w TLSO Brace for the patient  -PT/OT and ok to Ambulate with TLSO per Neurosurgery -Home health PT -Patient underwent T11 kyphoplasty on 1/22.   Anticipate continued physical therapy and pain management   Acute on Chronic Kidney Disease Stage IIIa.  -Chart review indicates nephrologist in [place].   -Creatinine baseline 1.6-2.0 range.   -likely mildly prerenal , will add ivf gentle hydration -monitor labs -Avoid Nephrotoxic Medications, Contrast Dyes, Hypotension and Renally Adjust Medications   COPD.  Not on home oxygen.   -Home medications include albuterol inhaler.  Stable at baseline -Takes Spiriva as well but not listed on his home MAR so we will need to reach out to pharmacy to add -Postoperatively, he was continued on oxygen.  ..>now weaning off.   History of MDS. Pancytopenia   -Lab work reveals anemia and thrombocytopenia and Leukopenia.   -Chart review indicates current hemoglobin of 10.5 is close to his baseline.   -In addition chart review indicates platelet count of 95 is a little less than baseline. -Patient's WBC was 2.6, Hb/Hct wasd 9.8/30.6, and Platelet Count was 90,000 -Sees Hematology in [place] -Continue to Monitor for S/Sx of Bleeding Currently h/h stable.  DVT prophylaxis: SCD  Code Status: Full Family Communication: None at bedside  Disposition Plan: Anticipate discharge home in the next 24hrs if pain is better controlled.  Marland Kitchen  He will need home health PT and OT.  Consultants:   Neurosurgery  Procedures: MRI, T11 kyphoplasty  Antimicrobials:  Anti-infectives (From admission, onward)   Start     Dose/Rate Route Frequency Ordered Stop   06/09/19 1945  ceFAZolin (ANCEF) IVPB 2g/100 mL premix     2 g 200 mL/hr over 30 Minutes Intravenous Every 8 hours 06/09/19 1935 06/10/19 0330   06/09/19 0800  ceFAZolin (ANCEF) IVPB 2g/100 mL premix     2 g 200 mL/hr over 30  Minutes Intravenous 30 min pre-op 06/09/19 0938 06/09/19 1753     Subjective: Patient complaining of back pain.  No other new complaints.  Objective: Vitals:   06/10/19 2345 06/10/19 2345 06/11/19 0404 06/11/19 0804  BP: 111/69 111/69 120/65 121/68  Pulse: 64 64 65 62  Resp: 17 18 18 18   Temp: 97.8 F (36.6 C) 97.8 F (36.6 C) 98.1 F (36.7 C) 98 F (36.7 C)  TempSrc: Oral Oral Oral Oral  SpO2: 98% 98% 98% 100%  Weight:      Height:        Intake/Output Summary (Last 24 hours) at 06/11/2019 0845 Last data filed at 06/11/2019 0700 Gross per 24 hour  Intake 686.51 ml  Output 250 ml  Net 436.51 ml   Filed Weights   06/09/19 1655  Weight: 63.5 kg    Examination: Physical Exam:  General exam: NAD, looks tired, sitting up in bed having breakfast Respiratory system: Clear to auscultation, no wheeze rales rhonchi's  Cardiovascular system:RRR. No murmurs, rubs, gallops. Gastrointestinal system: Abdomen is nondistended, soft and nontender. No organomegaly or masses felt. Normal bowel sounds heard. Central nervous system: Alert and oriented. No focal neurological deficits. Extremities: No edema Skin: Warm dry Psychiatry: Judgement and insight appear normal. Mood & affect appropriate.    Data Reviewed: I have personally reviewed following labs and imaging studies  CBC: Recent Labs  Lab 06/05/19 1531 06/06/19 0442 06/07/19 0621 06/09/19 0358 06/10/19 0222  WBC 5.3 2.5* 3.5* 2.6* 2.1*  NEUTROABS  --   --   --  1.2*  --   HGB 10.6* 10.5* 9.8* 9.8* 9.6*  HCT 33.8* 32.6* 30.7* 30.6* 29.6*  MCV 91.1 90.1 90.0 89.7 88.9  PLT 100* 95* 90* 90* 92*   Basic Metabolic Panel: Recent Labs  Lab 06/06/19 0442 06/07/19 0621 06/09/19 0358 06/10/19 0222 06/11/19 0514  NA 140 140 138 138 139  K 4.7 3.8 3.9 4.4 4.1  CL 106 106 106 105 105  CO2 26 23 25 25 28   GLUCOSE 126* 92 85 126* 92  BUN 27* 28* 22 29* 23  CREATININE 1.36* 1.59* 1.41* 1.95* 1.17  CALCIUM 9.6 9.2 9.0  9.1 9.1  MG  --   --  1.8  --   --   PHOS  --   --  3.1  --   --    GFR: Estimated Creatinine Clearance: 40 mL/min (by C-G formula based on SCr of 1.17 mg/dL). Liver Function Tests: Recent Labs  Lab 06/05/19 1531 06/09/19 0358  AST 16 12*  ALT 15 11  ALKPHOS 61 52  BILITOT 1.4* 1.0  PROT 6.8 5.6*  ALBUMIN 4.0 3.4*   No results for input(s): LIPASE, AMYLASE in the last 168 hours. No results for input(s): AMMONIA in the last 168 hours. Coagulation Profile: Recent Labs  Lab 06/10/19 0222  INR 1.0   Cardiac Enzymes: No results for input(s): CKTOTAL, CKMB, CKMBINDEX, TROPONINI in the last 168 hours. BNP (last 3 results) No results for input(s): PROBNP in the last 8760 hours. HbA1C: No results  for input(s): HGBA1C in the last 72 hours. CBG: Recent Labs  Lab 06/05/19 1525  GLUCAP 87   Lipid Profile: No results for input(s): CHOL, HDL, LDLCALC, TRIG, CHOLHDL, LDLDIRECT in the last 72 hours. Thyroid Function Tests: No results for input(s): TSH, T4TOTAL, FREET4, T3FREE, THYROIDAB in the last 72 hours. Anemia Panel: No results for input(s): VITAMINB12, FOLATE, FERRITIN, TIBC, IRON, RETICCTPCT in the last 72 hours. Sepsis Labs: No results for input(s): PROCALCITON, LATICACIDVEN in the last 168 hours.  Recent Results (from the past 240 hour(s))  SARS CORONAVIRUS 2 (TAT 6-24 HRS) Nasopharyngeal Nasopharyngeal Swab     Status: None   Collection Time: 06/06/19  2:35 AM   Specimen: Nasopharyngeal Swab  Result Value Ref Range Status   SARS Coronavirus 2 NEGATIVE NEGATIVE Final    Comment: (NOTE) SARS-CoV-2 target nucleic acids are NOT DETECTED. The SARS-CoV-2 RNA is generally detectable in upper and lower respiratory specimens during the acute phase of infection. Negative results do not preclude SARS-CoV-2 infection, do not rule out co-infections with other pathogens, and should not be used as the sole basis for treatment or other patient management decisions. Negative  results must be combined with clinical observations, patient history, and epidemiological information. The expected result is Negative. Fact Sheet for Patients: SugarRoll.be Fact Sheet for Healthcare Providers: https://www.woods-mathews.com/ This test is not yet approved or cleared by the [place] FDA and  has been authorized for detection and/or diagnosis of SARS-CoV-2 by FDA under an Emergency Use Authorization (EUA). This EUA will remain  in effect (meaning this test can be used) for the duration of the COVID-19 declaration under Section 56 4(b)(1) of the Act, 21 U.S.C. section 360bbb-3(b)(1), unless the authorization is terminated or revoked sooner. Performed at [facility], [place] [address] [place], [place] [address]   Surgical pcr screen     Status: None   Collection Time: 06/09/19 10:33 AM   Specimen: Nasal Mucosa; Nasal Swab  Result Value Ref Range Status   MRSA, PCR NEGATIVE NEGATIVE Final   Staphylococcus aureus NEGATIVE NEGATIVE Final    Comment: (NOTE) The Xpert SA Assay (FDA approved for NASAL specimens in patients 82 years of age and older), is one component of a comprehensive surveillance program. It is not intended to diagnose infection nor to guide or monitor treatment. Performed at [facility], [place] [address], [place], [place] [address]     Radiology Studies: DG Thoracic Spine 2 View  Result Date: 06/09/2019 CLINICAL DATA:  T11 kyphoplasty EXAM: THORACIC SPINE 2 VIEWS COMPARISON:  06/05/2019 FINDINGS: Intraoperative spot images demonstrate single level vertebral augmentation changes, reportedly T11. No visible complicating feature. IMPRESSION: Single level vertebral augmentation as above. Electronically Signed   By: Rolm Baptise M.D.   On: 06/09/2019 19:12   DG C-Arm 1-60 Min  Result Date: 06/09/2019 CLINICAL DATA:  T11 fracture. EXAM: DG C-ARM 1-60 MIN FLUOROSCOPY TIME:  Fluoroscopy Time:  44 seconds  Radiation Exposure Index (if provided by the fluoroscopic device): Not available Number of Acquired Spot Images: 2 COMPARISON:  MRI 06/07/2019 FINDINGS: Frontal and lateral fluoroscopic spot views obtained in the operating room. Kyphoplasty within lower thoracic vertebra, presumably T11. Spinal levels not well evaluated on the current exam. Chronic compression fracture of (tentative) T9. Total fluoroscopy time 44 seconds. IMPRESSION: Intraoperative fluoroscopy for kyphoplasty. Electronically Signed   By: Keith Rake M.D.   On: 06/09/2019 19:15   Scheduled Meds: . acetaminophen  1,000 mg Oral TID  . lidocaine  1  patch Transdermal Q24H  . senna  1 tablet Oral Daily  . sodium chloride flush  3 mL Intravenous Once  . sodium chloride flush  3 mL Intravenous Q12H   Continuous Infusions: . sodium chloride 50 mL/hr at 06/10/19 1416  . sodium chloride Stopped (06/10/19 0549)     LOS: 5 days   Time spent 25 minutes with more than 50% COC  Adelei Scobey Marry Guan, MD [facility] PAGER is on [place]  If 7PM-7AM, please contact night-coverage [email or web address]

## 2019-06-11 NOTE — Progress Notes (Signed)
Subjective: Patient reports still moderate amount of back pain. States "the kyphoplasty didn't work this time". Denies any radicular pain or weakness  Objective: Vital signs in last 24 hours: Temp:  [97.8 F (36.6 C)-98.6 F (37 C)] 98 F (36.7 C) (01/24 0804) Pulse Rate:  [62-80] 62 (01/24 0804) Resp:  [14-20] 18 (01/24 0804) BP: (97-127)/(51-73) 121/68 (01/24 0804) SpO2:  [98 %-100 %] 100 % (01/24 0804)  Intake/Output from previous day: 01/23 0701 - 01/24 0700 In: 686.5 [I.V.:686.5] Out: 250 [Urine:250] Intake/Output this shift: No intake/output data recorded.  Neurologic: Grossly normal  Lab Results: Lab Results  Component Value Date   WBC 2.1 (L) 06/10/2019   HGB 9.6 (L) 06/10/2019   HCT 29.6 (L) 06/10/2019   MCV 88.9 06/10/2019   PLT 92 (L) 06/10/2019   Lab Results  Component Value Date   INR 1.0 06/10/2019   BMET Lab Results  Component Value Date   NA 139 06/11/2019   K 4.1 06/11/2019   CL 105 06/11/2019   CO2 28 06/11/2019   GLUCOSE 92 06/11/2019   BUN 23 06/11/2019   CREATININE 1.17 06/11/2019   CALCIUM 9.1 06/11/2019    Studies/Results: DG Thoracic Spine 2 View  Result Date: 06/09/2019 CLINICAL DATA:  T11 kyphoplasty EXAM: THORACIC SPINE 2 VIEWS COMPARISON:  06/05/2019 FINDINGS: Intraoperative spot images demonstrate single level vertebral augmentation changes, reportedly T11. No visible complicating feature. IMPRESSION: Single level vertebral augmentation as above. Electronically Signed   By: Rolm Baptise M.D.   On: 06/09/2019 19:12   DG C-Arm 1-60 Min  Result Date: 06/09/2019 CLINICAL DATA:  T11 fracture. EXAM: DG C-ARM 1-60 MIN FLUOROSCOPY TIME:  Fluoroscopy Time:  44 seconds Radiation Exposure Index (if provided by the fluoroscopic device): Not available Number of Acquired Spot Images: 2 COMPARISON:  MRI 06/07/2019 FINDINGS: Frontal and lateral fluoroscopic spot views obtained in the operating room. Kyphoplasty within lower thoracic vertebra,  presumably T11. Spinal levels not well evaluated on the current exam. Chronic compression fracture of (tentative) T9. Total fluoroscopy time 44 seconds. IMPRESSION: Intraoperative fluoroscopy for kyphoplasty. Electronically Signed   By: Keith Rake M.D.   On: 06/09/2019 19:15    Assessment/Plan: Status post T11 kyphoplasty. Continue pain management and mobilize with therapy   LOS: 5 days    Ocie Cornfield [facility] 06/11/2019, 8:57 AM

## 2019-06-12 ENCOUNTER — Encounter: Payer: Self-pay | Admitting: *Deleted

## 2019-06-12 ENCOUNTER — Other Ambulatory Visit: Payer: Self-pay | Admitting: *Deleted

## 2019-06-12 MED ORDER — CYCLOBENZAPRINE HCL 7.5 MG PO TABS: 7.5000 mg | ORAL_TABLET | Freq: Three times a day (TID) | ORAL | 0 refills | Status: DC | PRN

## 2019-06-12 MED ORDER — CALCIUM CARBONATE ANTACID 500 MG PO CHEW
1.0000 | CHEWABLE_TABLET | Freq: Three times a day (TID) | ORAL | Status: DC
  Administered 2019-06-12: 200 mg via ORAL
  Filled 2019-06-12: qty 1

## 2019-06-12 MED ORDER — SENNA 8.6 MG PO TABS: 1.0000 | ORAL_TABLET | Freq: Every day | ORAL | 0 refills | Status: DC

## 2019-06-12 NOTE — Progress Notes (Signed)
Patient being discharged home with home health. Education and information provided to patient. IV removed. All belongings with patient. Leaving unit via wheelchair.

## 2019-06-12 NOTE — Consult Note (Signed)
   [facility] CM Inpatient Consult   06/12/2019  [place] February 14, 1930 [id number]   Follow up:  Chart reviewed for this [facility] active patient for disposition.  Inpatient TOC RNCM notes patient to go to daughter's in [place]. Plan:  Update [facility] RNCM of disposition.  For questions, please contact:  Natividad Brood, RN BSN [facility] Liaison  [phone] business mobile phone Toll free office [phone]  Fax number: [phone] [email or web address]

## 2019-06-12 NOTE — Discharge Summary (Signed)
Physician Discharge Summary  [name] SWN:[id number] DOB: 12-Feb-1930 DOA: 06/05/2019  PCP: Thomas Sites, MD  Admit date: 06/05/2019  Discharge date: 06/12/2019  Admitted From:Home  Disposition:  Home  Recommendations for Outpatient Follow-up:  1. Follow up with PCP in 1-2 weeks 2. Follow up with Neurosurgery as scheduled  Home Health:Yes with PT  Equipment/Devices:None  Discharge Condition:Stable  CODE STATUS: Full  Diet recommendation: Heart Healthy  Brief/Interim Summary: HPI per [name] on 06/06/2019 [name] a 86 y.o.malewithhistory of COPD, MDS was recently admitted 3 weeks ago for COPD/bronchiectasis exacerbation presents to the ER with worsening low back pain for the third time in last 1 week. Patient's pain started about a week ago which has been constant with no radiation mostly in the low back. Has had kyphoplasty previously by Dr. Kathyrn Sheriff neurosurgeon. Denies any incontinence of urine bowel fever chills or any weakness of the extremities. Pain does not radiate to the lower extremities.  ED Course:In the ER patient had x-rays which did not show any acute fractures. As a CT lumbar spine during last visit few days ago which also did not show any acute fractures. Does have chronic lumbar and thoracic fractures. Patient has benign persistent pain despite multiple dose of pain relief medication and has been admitted for further management. ER physician has discussed with Dr. Venetia Constable on-call neurosurgery will be seeing patient in consult.  He had kyphoplasty 1/22 is being follow by NSG but has continued pain, he is ambulating and pain is improved. He is stable for DC to home with home health PT and has been seen by neurosurgery who will follow up with him outpatient.  Discharge Diagnoses:  Principal Problem:   Acute exacerbation of chronic low back pain Active Problems:   GERD (gastroesophageal reflux disease)   Anemia    Pulmonary nodule   Thrombocytopenia (HCC)   Chronic kidney insufficiency   Intractable back pain  Acute T11 Compression fracture s/p kyphoplasty 1/22.  Discharge Instructions  Discharge Instructions    Diet - low sodium heart healthy   Complete by: As directed    Increase activity slowly   Complete by: As directed      Allergies as of 06/12/2019   No Known Allergies     Medication List    TAKE these medications   albuterol 108 (90 Base) MCG/ACT inhaler Commonly known as: VENTOLIN HFA Inhale 2 puffs into the lungs every 4 (four) hours as needed for wheezing or shortness of breath.   cyclobenzaprine 7.5 MG tablet Commonly known as: FEXMID Take 1 tablet (7.5 mg total) by mouth 3 (three) times daily as needed for muscle spasms.   diazepam 5 MG tablet Commonly known as: Valium Take 1 tablet (5 mg total) by mouth every 6 (six) hours as needed for anxiety (spasms).   lidocaine 5 % Commonly known as: Lidoderm Place 1 patch onto the skin daily. Remove & Discard patch within 12 hours or as directed by MD What changed:   when to take this  reasons to take this   multivitamin with minerals Tabs tablet Take 1 tablet by mouth daily.   oxyCODONE-acetaminophen 5-325 MG tablet Commonly known as: PERCOCET/ROXICET Take 1 tablet by mouth every 4 (four) hours as needed. What changed: reasons to take this   predniSONE 5 MG tablet Commonly known as: DELTASONE Take 5 tablets daily with breakfast for 5 days, then resume 1 tablet daily What changed:   how much to take  how to take this  when to take this  additional instructions   senna 8.6 MG Tabs tablet Commonly known as: SENOKOT Take 1 tablet (8.6 mg total) by mouth daily. Start taking on: June 13, 2019            Durable Medical Equipment  (From admission, onward)         Start     Ordered   06/09/19 1252  For home use only DME Walker rolling  Once    Question Answer Comment  Walker: With 5 Inch Wheels    Patient needs a walker to treat with the following condition Compression fracture of T11 vertebra ([place])      06/09/19 1254         Follow-up Information    Thomas Sites, MD Follow up in 2 week(s).   Specialty: Family Medicine Contact information: [address] [place] [address] [phone]          No Known Allergies  Consultations:  Neurosurgery   Procedures/Studies: DG Thoracic Spine 2 View  Result Date: 06/09/2019 CLINICAL DATA:  T11 kyphoplasty EXAM: THORACIC SPINE 2 VIEWS COMPARISON:  06/05/2019 FINDINGS: Intraoperative spot images demonstrate single level vertebral augmentation changes, reportedly T11. No visible complicating feature. IMPRESSION: Single level vertebral augmentation as above. Electronically Signed   By: Rolm Baptise M.D.   On: 06/09/2019 19:12   DG Thoracic Spine 2 View  Result Date: 05/31/2019 CLINICAL DATA:  Back pain after lifting this morning. EXAM: THORACIC SPINE 2 VIEWS COMPARISON:  Thoracic MRI, 09/21/2011. Chest radiographs, 12/08/2017. FINDINGS: There are wedge-shaped compression fractures of T6, T7, T8 and T9. T8 fracture is new since the prior thoracic MRI. However, it appears stable from prior lateral chest radiograph from July 2019. No convincing acute fracture. No malalignment. Skeletal structures are diffusely demineralized. Soft tissues are unremarkable. IMPRESSION: 1. No acute fracture. 2. Chronic midthoracic spine compression fractures. Diffuse skeletal demineralization. Electronically Signed   By: Lajean Manes M.D.   On: 05/31/2019 14:46   DG Lumbar Spine Complete  Result Date: 06/05/2019 CLINICAL DATA:  Back pain. Patient reports low back surgery 1 month ago, pain since that time. EXAM: LUMBAR SPINE - COMPLETE 4+ VIEW COMPARISON:  Lumbar spine CT 06/02/2019, thoracic and lumbar spine radiographs 05/31/2019 FINDINGS: Chronic compression fractures of L2, L3, L4 and L5, unchanged from recent CT. Vertebral augmentation within  L2. Compression fracture of T11 and T9 are unchanged from recent radiograph. Bones are diffusely under mineralized. No evidence of acute fracture. Sacroiliac joints are congruent. IMPRESSION: Chronic compression fractures of T9, T11, L2, L3, L4 and L5 from recent imaging. No evidence of acute fracture. Electronically Signed   By: Keith Rake M.D.   On: 06/05/2019 23:28   DG Lumbar Spine Complete  Result Date: 05/31/2019 CLINICAL DATA:  Low back pain after lifting this morning. History of kyphoplasty 1 month ago. EXAM: LUMBAR SPINE - COMPLETE 4+ VIEW COMPARISON:  09/16/2017 FINDINGS: Previously seen fracture of L2 has been treated with kyphoplasty. There is a chronic fracture of L3. Is a mild to moderate compression deformity of L4 which appears increased in severity from the prior exam. Mild loss vertebral body height of L5 is stable. No spondylolisthesis. Mild loss of disc height at L5-S1. Remaining discs are well preserved in height. Skeletal structures are diffusely demineralized. There are scattered calcifications along a normal caliber abdominal aorta. IMPRESSION: 1. Increase in loss of vertebral body height of L4 since the prior study, which could reflect an acute on chronic insufficiency fracture. 2.  Other fractures are stable from prior radiographs. 3. No malalignment. Electronically Signed   By: Lajean Manes M.D.   On: 05/31/2019 14:42   CT Lumbar Spine Wo Contrast  Result Date: 06/02/2019 CLINICAL DATA:  Low back pain.  History of fracture. EXAM: CT LUMBAR SPINE WITHOUT CONTRAST TECHNIQUE: Multidetector CT imaging of the lumbar spine was performed without intravenous contrast administration. Multiplanar CT image reconstructions were also generated. COMPARISON:  CT lumbar spine 03/15/2019. Lumbar spine radiographs 05/31/2019. Lumbar MRI 04/16/2019 FINDINGS: Segmentation: Normal Alignment: Normal Vertebrae: Chronic compression fracture L2 with kyphoplasty. Fracture unchanged from the prior CT.  Chronic compression fractures L3, L4, L5 unchanged from prior studies. No sacral adenopathy Paraspinal and other soft tissues: Atherosclerotic aorta without aneurysm. Large cyst right lower pole. Smaller cyst left lower pole. No renal obstruction or stone. No paraspinous adenopathy. Disc levels: T12-L1: Mild disc degeneration without stenosis L1-2: Mild disc bulging and mild facet degeneration without stenosis. L2-3: Moderate disc bulging and mild facet degeneration. Mild spinal stenosis L3-4: Moderate disc bulging. Bilateral facet degeneration with mild spinal stenosis L4-5: Diffuse disc bulging with mild to moderate facet degeneration. No significant stenosis L5-S1 disc degeneration with disc space narrowing and gas in the disc space. Mild spurring. Negative for spinal or foraminal stenosis IMPRESSION: No acute lumbar fracture. Chronic fractures L2, L3, L4, L5 appear unchanged from prior study Atherosclerotic aorta Lumbar degenerative changes as above. Mild spinal stenosis L2-3 and L3-4. Electronically Signed   By: Franchot Gallo M.D.   On: 06/02/2019 13:10   MR THORACIC SPINE WO CONTRAST  Result Date: 06/07/2019 CLINICAL DATA:  Low back pain EXAM: MRI THORACIC AND LUMBAR SPINE WITHOUT CONTRAST TECHNIQUE: Multiplanar and multiecho pulse sequences of the thoracic and lumbar spine were obtained without intravenous contrast. COMPARISON:  None. FINDINGS: Examination is degraded by motion. Additionally, the patient could not tolerate the full length of the examination. Only 1 axial sequence of the thoracic spine was acquired. MRI THORACIC SPINE FINDINGS Alignment:  Normal alignment with increased thoracic kyphosis. Vertebrae: Acute compression fracture of T11 with approximately 25% central height loss and retropulsion measuring 6 mm. There are chronic compression deformities at T6 through T8. Cord:  Normal signal and morphology. Paraspinal and other soft tissues: Negative. Disc levels: Severe motion degradation on  axial images. Within that limitation, there is no significant spinal canal stenosis. MRI LUMBAR SPINE FINDINGS Segmentation:  Standard. Alignment:  Physiologic. Vertebrae: Status post L2 vertebral augmentation. Approximately 50% central height loss at L2, L3 and L4. No acute fracture. Lumbar bone marrow signal is diffusely heterogeneous. Conus medullaris and cauda equina: Conus extends to the L1-2 disc level. Conus and cauda equina appear normal. Paraspinal and other soft tissues: Negative Disc levels: No axial imaging of the lumbar spine was acquired. Based on sagittal imaging, there is no spinal canal stenosis. There is moderate bilateral L5 neural foraminal stenosis. IMPRESSION: 1. Truncated and motion degraded examination. 2. Acute or subacute compression fracture of T11 with mild edema, approximately 25% height loss and 6 mm retropulsion. No associated spinal canal stenosis. 3. Multiple chronic compression fractures of the thoracic and lumbar spine. 4. Heterogeneous bone marrow signal within the lower thoracic spine and lumbar spine, compatible with reported myelodysplastic syndrome. Electronically Signed   By: Ulyses Jarred M.D.   On: 06/07/2019 20:59   MR LUMBAR SPINE WO CONTRAST  Result Date: 06/07/2019 CLINICAL DATA:  Low back pain EXAM: MRI THORACIC AND LUMBAR SPINE WITHOUT CONTRAST TECHNIQUE: Multiplanar and multiecho pulse sequences of the thoracic  and lumbar spine were obtained without intravenous contrast. COMPARISON:  None. FINDINGS: Examination is degraded by motion. Additionally, the patient could not tolerate the full length of the examination. Only 1 axial sequence of the thoracic spine was acquired. MRI THORACIC SPINE FINDINGS Alignment:  Normal alignment with increased thoracic kyphosis. Vertebrae: Acute compression fracture of T11 with approximately 25% central height loss and retropulsion measuring 6 mm. There are chronic compression deformities at T6 through T8. Cord:  Normal signal and  morphology. Paraspinal and other soft tissues: Negative. Disc levels: Severe motion degradation on axial images. Within that limitation, there is no significant spinal canal stenosis. MRI LUMBAR SPINE FINDINGS Segmentation:  Standard. Alignment:  Physiologic. Vertebrae: Status post L2 vertebral augmentation. Approximately 50% central height loss at L2, L3 and L4. No acute fracture. Lumbar bone marrow signal is diffusely heterogeneous. Conus medullaris and cauda equina: Conus extends to the L1-2 disc level. Conus and cauda equina appear normal. Paraspinal and other soft tissues: Negative Disc levels: No axial imaging of the lumbar spine was acquired. Based on sagittal imaging, there is no spinal canal stenosis. There is moderate bilateral L5 neural foraminal stenosis. IMPRESSION: 1. Truncated and motion degraded examination. 2. Acute or subacute compression fracture of T11 with mild edema, approximately 25% height loss and 6 mm retropulsion. No associated spinal canal stenosis. 3. Multiple chronic compression fractures of the thoracic and lumbar spine. 4. Heterogeneous bone marrow signal within the lower thoracic spine and lumbar spine, compatible with reported myelodysplastic syndrome. Electronically Signed   By: Ulyses Jarred M.D.   On: 06/07/2019 20:59   DG C-Arm 1-60 Min  Result Date: 06/09/2019 CLINICAL DATA:  T11 fracture. EXAM: DG C-ARM 1-60 MIN FLUOROSCOPY TIME:  Fluoroscopy Time:  44 seconds Radiation Exposure Index (if provided by the fluoroscopic device): Not available Number of Acquired Spot Images: 2 COMPARISON:  MRI 06/07/2019 FINDINGS: Frontal and lateral fluoroscopic spot views obtained in the operating room. Kyphoplasty within lower thoracic vertebra, presumably T11. Spinal levels not well evaluated on the current exam. Chronic compression fracture of (tentative) T9. Total fluoroscopy time 44 seconds. IMPRESSION: Intraoperative fluoroscopy for kyphoplasty. Electronically Signed   By: Keith Rake M.D.   On: 06/09/2019 19:15     Discharge Exam: Vitals:   06/12/19 0345 06/12/19 0744  BP: 107/72 115/75  Pulse: 66 65  Resp: 17 18  Temp: 98.3 F (36.8 C) 97.8 F (36.6 C)  SpO2: 95% 93%   Vitals:   06/11/19 2020 06/11/19 2339 06/12/19 0345 06/12/19 0744  BP: (!) 104/58 138/65 107/72 115/75  Pulse: 67 75 66 65  Resp: 17 17 17 18   Temp: 98.9 F (37.2 C) 98.1 F (36.7 C) 98.3 F (36.8 C) 97.8 F (36.6 C)  TempSrc: Oral Oral Oral Oral  SpO2: 96% 97% 95% 93%  Weight:      Height:        General: Pt is alert, awake, not in acute distress Cardiovascular: RRR, S1/S2 +, no rubs, no gallops Respiratory: CTA bilaterally, no wheezing, no rhonchi Abdominal: Soft, NT, ND, bowel sounds + Extremities: no edema, no cyanosis    The results of significant diagnostics from this hospitalization (including imaging, microbiology, ancillary and laboratory) are listed below for reference.     Microbiology: Recent Results (from the past 240 hour(s))  SARS CORONAVIRUS 2 (TAT 6-24 HRS) Nasopharyngeal Nasopharyngeal Swab     Status: None   Collection Time: 06/06/19  2:35 AM   Specimen: Nasopharyngeal Swab  Result Value Ref Range Status  SARS Coronavirus 2 NEGATIVE NEGATIVE Final    Comment: (NOTE) SARS-CoV-2 target nucleic acids are NOT DETECTED. The SARS-CoV-2 RNA is generally detectable in upper and lower respiratory specimens during the acute phase of infection. Negative results do not preclude SARS-CoV-2 infection, do not rule out co-infections with other pathogens, and should not be used as the sole basis for treatment or other patient management decisions. Negative results must be combined with clinical observations, patient history, and epidemiological information. The expected result is Negative. Fact Sheet for Patients: SugarRoll.be Fact Sheet for Healthcare Providers: https://www.woods-mathews.com/ This test is not yet  approved or cleared by the [place] FDA and  has been authorized for detection and/or diagnosis of SARS-CoV-2 by FDA under an Emergency Use Authorization (EUA). This EUA will remain  in effect (meaning this test can be used) for the duration of the COVID-19 declaration under Section 56 4(b)(1) of the Act, 21 U.S.C. section 360bbb-3(b)(1), unless the authorization is terminated or revoked sooner. Performed at [facility], [place] [address], [place], [place] [address]   Surgical pcr screen     Status: None   Collection Time: 06/09/19 10:33 AM   Specimen: Nasal Mucosa; Nasal Swab  Result Value Ref Range Status   MRSA, PCR NEGATIVE NEGATIVE Final   Staphylococcus aureus NEGATIVE NEGATIVE Final    Comment: (NOTE) The Xpert SA Assay (FDA approved for NASAL specimens in patients 54 years of age and older), is one component of a comprehensive surveillance program. It is not intended to diagnose infection nor to guide or monitor treatment. Performed at [facility], [place] [address], [place], [place] [address]      Labs: BNP (last 3 results) No results for input(s): BNP in the last 8760 hours. Basic Metabolic Panel: Recent Labs  Lab 06/06/19 0442 06/07/19 0621 06/09/19 0358 06/10/19 0222 06/11/19 0514  NA 140 140 138 138 139  K 4.7 3.8 3.9 4.4 4.1  CL 106 106 106 105 105  CO2 26 23 25 25 28   GLUCOSE 126* 92 85 126* 92  BUN 27* 28* 22 29* 23  CREATININE 1.36* 1.59* 1.41* 1.95* 1.17  CALCIUM 9.6 9.2 9.0 9.1 9.1  MG  --   --  1.8  --   --   PHOS  --   --  3.1  --   --    Liver Function Tests: Recent Labs  Lab 06/05/19 1531 06/09/19 0358  AST 16 12*  ALT 15 11  ALKPHOS 61 52  BILITOT 1.4* 1.0  PROT 6.8 5.6*  ALBUMIN 4.0 3.4*   No results for input(s): LIPASE, AMYLASE in the last 168 hours. No results for input(s): AMMONIA in the last 168 hours. CBC: Recent Labs  Lab 06/05/19 1531 06/06/19 0442 06/07/19 0621 06/09/19 0358 06/10/19 0222  WBC 5.3  2.5* 3.5* 2.6* 2.1*  NEUTROABS  --   --   --  1.2*  --   HGB 10.6* 10.5* 9.8* 9.8* 9.6*  HCT 33.8* 32.6* 30.7* 30.6* 29.6*  MCV 91.1 90.1 90.0 89.7 88.9  PLT 100* 95* 90* 90* 92*   Cardiac Enzymes: No results for input(s): CKTOTAL, CKMB, CKMBINDEX, TROPONINI in the last 168 hours. BNP: Invalid input(s): POCBNP CBG: Recent Labs  Lab 06/05/19 1525  GLUCAP 87   D-Dimer No results for input(s): DDIMER in the last 72 hours. Hgb A1c No results for input(s): HGBA1C in the last 72 hours. Lipid Profile No results for input(s): CHOL, HDL, LDLCALC, TRIG, CHOLHDL, LDLDIRECT in  the last 72 hours. Thyroid function studies No results for input(s): TSH, T4TOTAL, T3FREE, THYROIDAB in the last 72 hours.  Invalid input(s): FREET3 Anemia work up No results for input(s): VITAMINB12, FOLATE, FERRITIN, TIBC, IRON, RETICCTPCT in the last 72 hours. Urinalysis    Component Value Date/Time   COLORURINE YELLOW 06/06/2019 0240   APPEARANCEUR CLEAR 06/06/2019 0240   LABSPEC 1.023 06/06/2019 0240   PHURINE 5.0 06/06/2019 0240   GLUCOSEU NEGATIVE 06/06/2019 0240   HGBUR NEGATIVE 06/06/2019 0240   BILIRUBINUR NEGATIVE 06/06/2019 0240   KETONESUR NEGATIVE 06/06/2019 0240   PROTEINUR NEGATIVE 06/06/2019 0240   UROBILINOGEN 0.2 08/15/2013 1240   NITRITE NEGATIVE 06/06/2019 0240   LEUKOCYTESUR NEGATIVE 06/06/2019 0240   Sepsis Labs Invalid input(s): PROCALCITONIN,  WBC,  LACTICIDVEN Microbiology Recent Results (from the past 240 hour(s))  SARS CORONAVIRUS 2 (TAT 6-24 HRS) Nasopharyngeal Nasopharyngeal Swab     Status: None   Collection Time: 06/06/19  2:35 AM   Specimen: Nasopharyngeal Swab  Result Value Ref Range Status   SARS Coronavirus 2 NEGATIVE NEGATIVE Final    Comment: (NOTE) SARS-CoV-2 target nucleic acids are NOT DETECTED. The SARS-CoV-2 RNA is generally detectable in upper and lower respiratory specimens during the acute phase of infection. Negative results do not preclude SARS-CoV-2  infection, do not rule out co-infections with other pathogens, and should not be used as the sole basis for treatment or other patient management decisions. Negative results must be combined with clinical observations, patient history, and epidemiological information. The expected result is Negative. Fact Sheet for Patients: SugarRoll.be Fact Sheet for Healthcare Providers: https://www.woods-mathews.com/ This test is not yet approved or cleared by the [place] FDA and  has been authorized for detection and/or diagnosis of SARS-CoV-2 by FDA under an Emergency Use Authorization (EUA). This EUA will remain  in effect (meaning this test can be used) for the duration of the COVID-19 declaration under Section 56 4(b)(1) of the Act, 21 U.S.C. section 360bbb-3(b)(1), unless the authorization is terminated or revoked sooner. Performed at [facility], [place] [address], [place], [place] [address]   Surgical pcr screen     Status: None   Collection Time: 06/09/19 10:33 AM   Specimen: Nasal Mucosa; Nasal Swab  Result Value Ref Range Status   MRSA, PCR NEGATIVE NEGATIVE Final   Staphylococcus aureus NEGATIVE NEGATIVE Final    Comment: (NOTE) The Xpert SA Assay (FDA approved for NASAL specimens in patients 40 years of age and older), is one component of a comprehensive surveillance program. It is not intended to diagnose infection nor to guide or monitor treatment. Performed at [facility], [place] [address], [place], [place] [address]      Time coordinating discharge: 35 minutes  SIGNED:   Rodena Goldmann, DO [facility] 06/12/2019, 11:02 AM  If 7PM-7AM, please contact night-coverage [email or web address]

## 2019-06-12 NOTE — Progress Notes (Signed)
  NEUROSURGERY PROGRESS NOTE   No issues overnight. Reports improvement since yesterday evening. Rates pain 3/10. Feels as though he is headed in the right direction. No radicular symptoms, N/T/W Plan at discharge to go to daughters in [place]  EXAM:  BP 115/75 (BP Location: Right Arm)   Pulse 65   Temp 97.8 F (36.6 C) (Oral)   Resp 18   Ht 6' 3.98" (1.93 m)   Wt 63.5 kg   SpO2 93%   BMI 17.05 kg/m   Awake, alert, oriented  Speech fluent, appropriate  CN grossly intact  5/5 BUE/BLE  Incision: c/d/i  IMPRESSION/PLAN 86 y.o. male POD #3 T11 kyphoplasty. Appears to be improving. - cleared for d/c from Ns perspective - please call for any concerns

## 2019-06-12 NOTE — Patient Outreach (Signed)
[facility]) Care Management  06/12/2019  [place] 1929-07-30 [id number]   RN Health Coach Case Closure. Patient was admitted to hospital. He is being discharged to transition into his daughter care. Patient is transitioning to [place] to live with daughter.  Plan: Case closure  [facility] [phone]

## 2019-06-12 NOTE — TOC Transition Note (Signed)
Transition of Care [facility]) - CM/SW Discharge Note   Patient Details  Name: [name] MRN: [id number] Date of Birth: June 17, 1929  Transition of Care [facility]) CM/SW Contact:  Pollie Friar, RN Phone Number: 06/12/2019, 10:56 AM   Clinical Narrative:    Pt has decided to discharge to his daughters home in [place]. He will go home today and a friend will stay with him until tomorrow when daughter arrives.  CM has provided him HH orders for services in [place]. He states his daughter will be able to arrange [facility] up there.  Walker for home at the bedside. Pt did not qualify for home oxygen while ambulating. Pt has transportation home.   Final next level of care: Home w Home Health Services Barriers to Discharge: Barriers Resolved   Patient Goals and CMS Choice   CMS Medicare.gov Compare Post Acute Care list provided to:: Patient Choice offered to / list presented to : Patient  Discharge Placement                       Discharge Plan and Services   Discharge Planning Services: CM Consult Post Acute Care Choice: Home Health, Durable Medical Equipment          DME Arranged: Walker rolling DME Agency: [employer] Date DME Agency Contacted: 06/09/19   Representative spoke with at DME Agency: [place]: PT, OT [place] Agency: [place] Date [place]: 06/09/19   Representative spoke with at [place]: Tommi Rumps aware pt will not be using [place]  Social Determinants of Health (SDOH) Interventions     Readmission Risk Interventions No flowsheet data found.

## 2019-06-12 NOTE — Progress Notes (Signed)
Physical Therapy Treatment Patient Details Name: [name] MRN: [id number] DOB: 01-18-1930 Today's Date: 06/12/2019    History of Present Illness 86 y.o. male 1 month s/p L2 vertebral augmentation with recurrent, severe lower back pain that interferes with ADLs since a lifting injury several days ago. He is neurologically intact. MRI T and L spine reviewed. Primary findings at T11 where there is an acute compression fracture. Underwent kyphoplasty T11 06/09/19.    PT Comments    Pt performed gt training and progression to stair training.  Pt continues to benefit from skilled HHPT PT follow up.  PTA educated patient on correct brace application and fit.  He is ready to d/c home from a mobility stand point.     Follow Up Recommendations  Home health PT;Supervision - Intermittent     Equipment Recommendations  Rolling walker with 5" wheels    Recommendations for Other Services       Precautions / Restrictions Precautions Precautions: Back;Fall Precaution Booklet Issued: No Precaution Comments: reviewed precautions Required Braces or Orthoses: Spinal Brace Spinal Brace: Thoracolumbosacral orthotic;Applied in sitting position Restrictions Weight Bearing Restrictions: No Other Position/Activity Restrictions: Reviewed brace application with patient to ensure correct fit.    Mobility  Bed Mobility               General bed mobility comments: Pt standing in room with RN present.  Transfers Overall transfer level: Needs assistance Equipment used: Rolling walker (2 wheeled) Transfers: Sit to/from Stand Sit to Stand: Supervision         General transfer comment: supervision for safety to return to seated position.  Ambulation/Gait Ambulation/Gait assistance: Supervision Gait Distance (Feet): 200 Feet Assistive device: Rolling walker (2 wheeled) Gait Pattern/deviations: Step-through pattern;Decreased stride length;Trunk flexed Gait velocity: decreased   General  Gait Details: Continued cues for upper trunk control and forward gaze.  Pt with RW too short but refused to allow for raising of height as flexed posture appears to be baseline.   Stairs Stairs: Yes Stairs assistance: Min guard Stair Management: Step to pattern Number of Stairs: 4 General stair comments: Cues for sequencing and hand placement.   Wheelchair Mobility    Modified Rankin (Stroke Patients Only)       Balance Overall balance assessment: Mild deficits observed, not formally tested                                          Cognition Arousal/Alertness: Awake/alert Behavior During Therapy: WFL for tasks assessed/performed Overall Cognitive Status: Within Functional Limits for tasks assessed                                        Exercises      General Comments        Pertinent Vitals/Pain Pain Assessment: 0-10 Pain Score: 6  Pain Location: mid back Pain Descriptors / Indicators: Other (Comment);Constant Pain Intervention(s): Monitored during session;Repositioned    Home Living                      Prior Function            PT Goals (current goals can now be found in the care plan section) Acute Rehab PT Goals Patient Stated Goal: to have less pain Potential to Achieve Goals:  Good Progress towards PT goals: Progressing toward goals    Frequency    Min 3X/week      PT Plan Current plan remains appropriate    Co-evaluation              AM-PAC PT "6 Clicks" Mobility   Outcome Measure  Help needed turning from your back to your side while in a flat bed without using bedrails?: None Help needed moving from lying on your back to sitting on the side of a flat bed without using bedrails?: A Little Help needed moving to and from a bed to a chair (including a wheelchair)?: A Little Help needed standing up from a chair using your arms (e.g., wheelchair or bedside chair)?: A Little Help needed to walk  in hospital room?: A Little Help needed climbing 3-5 steps with a railing? : A Lot 6 Click Score: 18    End of Session Equipment Utilized During Treatment: Gait belt;Back brace Activity Tolerance: Patient limited by pain Patient left: in bed;with call bell/phone within reach;with bed alarm set Nurse Communication: Mobility status PT Visit Diagnosis: Pain;Difficulty in walking, not elsewhere classified (R26.2) Pain - part of body: (back)     Time: 5916-3846 PT Time Calculation (min) (ACUTE ONLY): 19 min  Charges:  $Gait Training: 8-22 mins                     Erasmo Leventhal , PTA Acute Rehabilitation Services Pager [phone] Office [phone]     Brandley Aldrete Eli Hose 06/12/2019, 2:14 PM

## 2019-06-16 ENCOUNTER — Encounter (HOSPITAL_COMMUNITY): Payer: Self-pay | Admitting: Emergency Medicine

## 2019-06-16 ENCOUNTER — Inpatient Hospital Stay (HOSPITAL_COMMUNITY)
Admission: EM | Admit: 2019-06-16 | Discharge: 2019-06-21 | DRG: 871 | Disposition: A | Discharge status: Home Health | Payer: Medicare Other | Attending: Internal Medicine | Admitting: Internal Medicine

## 2019-06-16 ENCOUNTER — Other Ambulatory Visit: Payer: Self-pay

## 2019-06-16 ENCOUNTER — Emergency Department (HOSPITAL_COMMUNITY): Payer: Medicare Other

## 2019-06-16 ENCOUNTER — Other Ambulatory Visit: Payer: Self-pay | Admitting: *Deleted

## 2019-06-16 DIAGNOSIS — E872 Acidosis: Secondary | ICD-10-CM | POA: Diagnosis present

## 2019-06-16 DIAGNOSIS — R509 Fever, unspecified: Secondary | ICD-10-CM | POA: Diagnosis not present

## 2019-06-16 DIAGNOSIS — E860 Dehydration: Secondary | ICD-10-CM

## 2019-06-16 DIAGNOSIS — R52 Pain, unspecified: Secondary | ICD-10-CM | POA: Diagnosis not present

## 2019-06-16 DIAGNOSIS — R0602 Shortness of breath: Secondary | ICD-10-CM | POA: Diagnosis not present

## 2019-06-16 DIAGNOSIS — R5383 Other fatigue: Secondary | ICD-10-CM | POA: Diagnosis not present

## 2019-06-16 DIAGNOSIS — N183 Chronic kidney disease, stage 3 unspecified: Secondary | ICD-10-CM

## 2019-06-16 DIAGNOSIS — Z20822 Contact with and (suspected) exposure to covid-19: Secondary | ICD-10-CM | POA: Diagnosis not present

## 2019-06-16 DIAGNOSIS — R6520 Severe sepsis without septic shock: Secondary | ICD-10-CM | POA: Diagnosis not present

## 2019-06-16 DIAGNOSIS — D649 Anemia, unspecified: Secondary | ICD-10-CM | POA: Diagnosis not present

## 2019-06-16 DIAGNOSIS — G8929 Other chronic pain: Secondary | ICD-10-CM | POA: Diagnosis present

## 2019-06-16 DIAGNOSIS — J441 Chronic obstructive pulmonary disease with (acute) exacerbation: Secondary | ICD-10-CM | POA: Diagnosis present

## 2019-06-16 DIAGNOSIS — Y95 Nosocomial condition: Secondary | ICD-10-CM | POA: Diagnosis present

## 2019-06-16 DIAGNOSIS — R0600 Dyspnea, unspecified: Secondary | ICD-10-CM

## 2019-06-16 DIAGNOSIS — Z79899 Other long term (current) drug therapy: Secondary | ICD-10-CM

## 2019-06-16 DIAGNOSIS — A419 Sepsis, unspecified organism: Principal | ICD-10-CM

## 2019-06-16 DIAGNOSIS — D6959 Other secondary thrombocytopenia: Secondary | ICD-10-CM | POA: Diagnosis not present

## 2019-06-16 DIAGNOSIS — Z681 Body mass index (BMI) 19 or less, adult: Secondary | ICD-10-CM

## 2019-06-16 DIAGNOSIS — J440 Chronic obstructive pulmonary disease with acute lower respiratory infection: Secondary | ICD-10-CM | POA: Diagnosis present

## 2019-06-16 DIAGNOSIS — J9601 Acute respiratory failure with hypoxia: Secondary | ICD-10-CM | POA: Diagnosis present

## 2019-06-16 DIAGNOSIS — R17 Unspecified jaundice: Secondary | ICD-10-CM | POA: Diagnosis present

## 2019-06-16 DIAGNOSIS — R636 Underweight: Secondary | ICD-10-CM | POA: Diagnosis present

## 2019-06-16 DIAGNOSIS — M545 Low back pain: Secondary | ICD-10-CM | POA: Diagnosis present

## 2019-06-16 DIAGNOSIS — Z87891 Personal history of nicotine dependence: Secondary | ICD-10-CM | POA: Diagnosis not present

## 2019-06-16 DIAGNOSIS — N1831 Chronic kidney disease, stage 3a: Secondary | ICD-10-CM | POA: Diagnosis not present

## 2019-06-16 DIAGNOSIS — D469 Myelodysplastic syndrome, unspecified: Secondary | ICD-10-CM | POA: Diagnosis not present

## 2019-06-16 DIAGNOSIS — J929 Pleural plaque without asbestos: Secondary | ICD-10-CM | POA: Diagnosis not present

## 2019-06-16 DIAGNOSIS — J449 Chronic obstructive pulmonary disease, unspecified: Secondary | ICD-10-CM | POA: Diagnosis not present

## 2019-06-16 DIAGNOSIS — R079 Chest pain, unspecified: Secondary | ICD-10-CM | POA: Diagnosis not present

## 2019-06-16 DIAGNOSIS — K219 Gastro-esophageal reflux disease without esophagitis: Secondary | ICD-10-CM | POA: Diagnosis present

## 2019-06-16 DIAGNOSIS — J189 Pneumonia, unspecified organism: Secondary | ICD-10-CM | POA: Diagnosis not present

## 2019-06-16 DIAGNOSIS — R0902 Hypoxemia: Secondary | ICD-10-CM | POA: Diagnosis not present

## 2019-06-16 DIAGNOSIS — R531 Weakness: Secondary | ICD-10-CM | POA: Diagnosis not present

## 2019-06-16 LAB — COMPREHENSIVE METABOLIC PANEL
ALT: 14 U/L (ref 0–44)
AST: 19 U/L (ref 15–41)
Albumin: 3.9 g/dL (ref 3.5–5.0)
Alkaline Phosphatase: 99 U/L (ref 38–126)
Anion gap: 11 (ref 5–15)
BUN: 19 mg/dL (ref 8–23)
CO2: 22 mmol/L (ref 22–32)
Calcium: 9.7 mg/dL (ref 8.9–10.3)
Chloride: 105 mmol/L (ref 98–111)
Creatinine, Ser: 1.41 mg/dL — ABNORMAL HIGH (ref 0.61–1.24)
GFR calc Af Amer: 52 mL/min — ABNORMAL LOW (ref >60)
GFR calc non Af Amer: 44 mL/min — ABNORMAL LOW (ref >60)
Glucose, Bld: 101 mg/dL — ABNORMAL HIGH (ref 70–99)
Potassium: 3.6 mmol/L (ref 3.5–5.1)
Sodium: 138 mmol/L (ref 135–145)
Total Bilirubin: 1.5 mg/dL — ABNORMAL HIGH (ref 0.3–1.2)
Total Protein: 6.6 g/dL (ref 6.5–8.1)

## 2019-06-16 LAB — CBC WITH DIFFERENTIAL/PLATELET
Abs Immature Granulocytes: 0.22 10*3/uL — ABNORMAL HIGH (ref 0.00–0.07)
Basophils Absolute: 0.1 10*3/uL (ref 0.0–0.1)
Basophils Relative: 1 %
Eosinophils Absolute: 0 10*3/uL (ref 0.0–0.5)
Eosinophils Relative: 0 %
HCT: 32.7 % — ABNORMAL LOW (ref 39.0–52.0)
Hemoglobin: 10.2 g/dL — ABNORMAL LOW (ref 13.0–17.0)
Immature Granulocytes: 2 %
Lymphocytes Relative: 5 %
Lymphs Abs: 0.6 10*3/uL — ABNORMAL LOW (ref 0.7–4.0)
MCH: 28.9 pg (ref 26.0–34.0)
MCHC: 31.2 g/dL (ref 30.0–36.0)
MCV: 92.6 fL (ref 80.0–100.0)
Monocytes Absolute: 1.8 10*3/uL — ABNORMAL HIGH (ref 0.1–1.0)
Monocytes Relative: 14 %
Neutro Abs: 10.2 10*3/uL — ABNORMAL HIGH (ref 1.7–7.7)
Neutrophils Relative %: 78 %
Platelets: 97 10*3/uL — ABNORMAL LOW (ref 150–400)
RBC: 3.53 MIL/uL — ABNORMAL LOW (ref 4.22–5.81)
RDW: 20.6 % — ABNORMAL HIGH (ref 11.5–15.5)
WBC: 12.9 10*3/uL — ABNORMAL HIGH (ref 4.0–10.5)
nRBC: 0 % (ref 0.0–0.2)

## 2019-06-16 LAB — POCT I-STAT 7, (LYTES, BLD GAS, ICA,H+H)
Bicarbonate: 23.7 mmol/L (ref 20.0–28.0)
Calcium, Ion: 1.28 mmol/L (ref 1.15–1.40)
HCT: 22 % — ABNORMAL LOW (ref 39.0–52.0)
Hemoglobin: 7.5 g/dL — ABNORMAL LOW (ref 13.0–17.0)
O2 Saturation: 95 %
Patient temperature: 102
Potassium: 3.6 mmol/L (ref 3.5–5.1)
Sodium: 138 mmol/L (ref 135–145)
TCO2: 25 mmol/L (ref 22–32)
pCO2 arterial: 35.1 mmHg (ref 32.0–48.0)
pH, Arterial: 7.445 (ref 7.350–7.450)
pO2, Arterial: 77 mmHg — ABNORMAL LOW (ref 83.0–108.0)

## 2019-06-16 LAB — RESPIRATORY PANEL BY RT PCR (FLU A&B, COVID)
Influenza A by PCR: NEGATIVE
Influenza B by PCR: NEGATIVE
SARS Coronavirus 2 by RT PCR: NEGATIVE

## 2019-06-16 LAB — TYPE AND SCREEN
ABO/RH(D): B NEG
Antibody Screen: NEGATIVE

## 2019-06-16 LAB — URINALYSIS, ROUTINE W REFLEX MICROSCOPIC
Bacteria, UA: NONE SEEN
Bilirubin Urine: NEGATIVE
Glucose, UA: NEGATIVE mg/dL
Ketones, ur: NEGATIVE mg/dL
Leukocytes,Ua: NEGATIVE
Nitrite: NEGATIVE
Protein, ur: NEGATIVE mg/dL
Specific Gravity, Urine: 1.014 (ref 1.005–1.030)
pH: 6 (ref 5.0–8.0)

## 2019-06-16 LAB — POC SARS CORONAVIRUS 2 AG -  ED: SARS Coronavirus 2 Ag: NEGATIVE

## 2019-06-16 LAB — TROPONIN I (HIGH SENSITIVITY)
Troponin I (High Sensitivity): 8 ng/L (ref <18)
Troponin I (High Sensitivity): 8 ng/L (ref <18)

## 2019-06-16 LAB — ABO/RH: ABO/RH(D): B NEG

## 2019-06-16 LAB — TSH: TSH: 1.087 u[IU]/mL (ref 0.350–4.500)

## 2019-06-16 LAB — BRAIN NATRIURETIC PEPTIDE: B Natriuretic Peptide: 68.6 pg/mL (ref 0.0–100.0)

## 2019-06-16 LAB — LACTIC ACID, PLASMA
Lactic Acid, Venous: 2 mmol/L (ref 0.5–1.9)
Lactic Acid, Venous: 1.4 mmol/L (ref 0.5–1.9)

## 2019-06-16 LAB — MAGNESIUM: Magnesium: 1.8 mg/dL (ref 1.7–2.4)

## 2019-06-16 MED ORDER — LACTATED RINGERS IV BOLUS
500.0000 mL | Freq: Once | INTRAVENOUS | Status: AC
  Administered 2019-06-16: 500 mL via INTRAVENOUS

## 2019-06-16 MED ORDER — SODIUM CHLORIDE 0.9 % IV SOLN
2.0000 g | Freq: Two times a day (BID) | INTRAVENOUS | Status: DC
  Administered 2019-06-16 – 2019-06-21 (×10): 2 g via INTRAVENOUS
  Filled 2019-06-16 (×11): qty 2

## 2019-06-16 MED ORDER — SODIUM CHLORIDE 0.9% FLUSH
3.0000 mL | Freq: Two times a day (BID) | INTRAVENOUS | Status: DC
  Administered 2019-06-17 – 2019-06-21 (×9): 3 mL via INTRAVENOUS

## 2019-06-16 MED ORDER — ADULT MULTIVITAMIN W/MINERALS CH
1.0000 | ORAL_TABLET | Freq: Every day | ORAL | Status: DC
  Administered 2019-06-17 – 2019-06-21 (×5): 1 via ORAL
  Filled 2019-06-16 (×5): qty 1

## 2019-06-16 MED ORDER — ACETAMINOPHEN 650 MG RE SUPP: 650.0000 mg | Freq: Four times a day (QID) | RECTAL | Status: DC | PRN

## 2019-06-16 MED ORDER — IOHEXOL 350 MG/ML SOLN
75.0000 mL | Freq: Once | INTRAVENOUS | Status: AC | PRN
  Administered 2019-06-16: 75 mL via INTRAVENOUS

## 2019-06-16 MED ORDER — ACETAMINOPHEN 500 MG PO TABS
1000.0000 mg | ORAL_TABLET | Freq: Once | ORAL | Status: AC
  Administered 2019-06-16: 1000 mg via ORAL
  Filled 2019-06-16: qty 2

## 2019-06-16 MED ORDER — VANCOMYCIN HCL 1250 MG/250ML IV SOLN
1250.0000 mg | Freq: Once | INTRAVENOUS | Status: AC
  Administered 2019-06-16: 1250 mg via INTRAVENOUS
  Filled 2019-06-16: qty 250

## 2019-06-16 MED ORDER — ACETAMINOPHEN 325 MG PO TABS: 650.0000 mg | ORAL_TABLET | Freq: Four times a day (QID) | ORAL | Status: DC | PRN

## 2019-06-16 MED ORDER — SODIUM CHLORIDE 0.9 % IV SOLN: INTRAVENOUS | Status: AC

## 2019-06-16 MED ORDER — OXYCODONE-ACETAMINOPHEN 5-325 MG PO TABS
1.0000 | ORAL_TABLET | ORAL | Status: DC | PRN
  Administered 2019-06-18 – 2019-06-19 (×4): 1 via ORAL
  Filled 2019-06-16 (×4): qty 1

## 2019-06-16 MED ORDER — ONDANSETRON HCL 4 MG/2ML IJ SOLN: 4.0000 mg | Freq: Four times a day (QID) | INTRAMUSCULAR | Status: DC | PRN

## 2019-06-16 MED ORDER — VANCOMYCIN HCL IN DEXTROSE 1-5 GM/200ML-% IV SOLN
1000.0000 mg | INTRAVENOUS | Status: DC
  Administered 2019-06-17 – 2019-06-19 (×3): 1000 mg via INTRAVENOUS
  Filled 2019-06-16 (×3): qty 200

## 2019-06-16 MED ORDER — SENNA 8.6 MG PO TABS
1.0000 | ORAL_TABLET | Freq: Every day | ORAL | Status: DC
  Administered 2019-06-17 – 2019-06-20 (×4): 8.6 mg via ORAL
  Filled 2019-06-16 (×4): qty 1

## 2019-06-16 MED ORDER — PREDNISONE 5 MG PO TABS
5.0000 mg | ORAL_TABLET | Freq: Every day | ORAL | Status: DC
  Administered 2019-06-17 – 2019-06-21 (×5): 5 mg via ORAL
  Filled 2019-06-16 (×5): qty 1

## 2019-06-16 MED ORDER — ALBUTEROL SULFATE (2.5 MG/3ML) 0.083% IN NEBU: 2.5000 mg | INHALATION_SOLUTION | RESPIRATORY_TRACT | Status: DC | PRN

## 2019-06-16 MED ORDER — DIAZEPAM 5 MG PO TABS
5.0000 mg | ORAL_TABLET | Freq: Four times a day (QID) | ORAL | Status: DC | PRN
  Administered 2019-06-18 – 2019-06-20 (×2): 5 mg via ORAL
  Filled 2019-06-16 (×2): qty 1

## 2019-06-16 MED ORDER — LIDOCAINE 5 % EX PTCH
1.0000 | MEDICATED_PATCH | Freq: Every day | CUTANEOUS | Status: DC | PRN
  Administered 2019-06-20: 1 via TRANSDERMAL
  Filled 2019-06-16: qty 1

## 2019-06-16 MED ORDER — CYCLOBENZAPRINE HCL 5 MG PO TABS: 7.5000 mg | ORAL_TABLET | Freq: Three times a day (TID) | ORAL | Status: DC | PRN

## 2019-06-16 MED ORDER — ONDANSETRON HCL 4 MG PO TABS: 4.0000 mg | ORAL_TABLET | Freq: Four times a day (QID) | ORAL | Status: DC | PRN

## 2019-06-16 NOTE — ED Notes (Signed)
Patient's daughter updated on patient's condition/ admission plan .

## 2019-06-16 NOTE — ED Triage Notes (Signed)
Pt here from home with c/o weakness and not feeling well . cbg 134 . Pt had back surgery on 1/19 just states that he feels bad

## 2019-06-16 NOTE — H&P (Signed)
History and Physical    ESIAS MORY MWN:[id number] DOB: 18-Jun-1929 DOA: 06/16/2019  PCP: Sharilyn Sites, MD    Patient coming from: Home    Chief Complaint: Fever chills shortness of breath general weakness   HPI: [name] is a 86 y.o. male with medical history significant of COPD, myelodysplastic syndrome, recent admission for T12 kyphoplasty discharged 4 days ago, came to emergency room for fatigue general weakness chills for 1 day.  In the emergency room was found to have a fever 102.  He has back pain secondary to surgery but denies any drainage or increased swelling or any recent injury to his back.  Denies any nausea vomiting or diarrhea.  No urinary symptoms.  Denies any blood in his stools.  Denies any hematemesis or hemoptysis.  ED Course: in the emergency room he was found to be tachypneic hypoxic, Fever102 Started on oxygen nasal cannula saturation 94% Lactic acid was 2.0, procalcitonin pending Blood cultures were done Chest x-ray negative CT chest angio no pulmonary embolus bilateral infiltrates suspicion of pneumonia Patient started on vancomycin and cefepime with suspicion of hospital-acquired pneumonia On 06/10/2019 hemoglobin 9.6 white count 2.1 On 06/16/2019 hemoglobin 10.2 white count 12.9 Covid test respiratory panel negative  Review of Systems: As per HPI otherwise 10 point review of systems negative.  With exception of shortness of breath chills fever general weakness  Past Medical History:  Diagnosis Date  . Bone cancer ([place])   . Chronic kidney insufficiency   . COPD (chronic obstructive pulmonary disease) ([place])   . GERD (gastroesophageal reflux disease)   . Pulmonary nodule     Past Surgical History:  Procedure Laterality Date  . BACK SURGERY    . CATARACT EXTRACTION W/PHACO Right 03/09/2016   Procedure: CATARACT EXTRACTION PHACO AND INTRAOCULAR LENS PLACEMENT RIGHT EYE CDE=8.58;  Surgeon: Tonny Branch, MD;  Location: [facility];  Service:  Ophthalmology;  Laterality: Right;  right  . CATARACT EXTRACTION W/PHACO Left 04/13/2016   Procedure: CATARACT EXTRACTION PHACO AND INTRAOCULAR LENS PLACEMENT (IOC);  Surgeon: Tonny Branch, MD;  Location: [facility];  Service: Ophthalmology;  Laterality: Left;  CDE: 8.08  . KYPHOPLASTY Bilateral 06/09/2019   Procedure: T11 KYPHOPLASTY;  Surgeon: Consuella Lose, MD;  Location: [place];  Service: Neurosurgery;  Laterality: Bilateral;  . None to Date  10/01/15     reports that he quit smoking about 28 years ago. His smoking use included cigarettes and cigars. He quit after 15.00 years of use. He has never used smokeless tobacco. He reports previous alcohol use. He reports that he does not use drugs.  No Known Allergies  Family History  Problem Relation Age of Onset  . Kidney disease Mother   . Kidney disease Sister   . Colon cancer Neg Hx   . Gastric cancer Neg Hx   . Esophageal cancer Neg Hx      Prior to Admission medications   Medication Sig Start Date End Date Taking? Authorizing Provider  albuterol (PROVENTIL HFA;VENTOLIN HFA) 108 (90 Base) MCG/ACT inhaler Inhale 2 puffs into the lungs every 4 (four) hours as needed for wheezing or shortness of breath. 05/21/18  Yes Johnson, Clanford L, MD  cyclobenzaprine (FEXMID) 7.5 MG tablet Take 1 tablet (7.5 mg total) by mouth 3 (three) times daily as needed for muscle spasms. 06/12/19  Yes Shah, Pratik D, DO  diazepam (VALIUM) 5 MG tablet Take 1 tablet (5 mg total) by mouth every 6 (six) hours as needed for anxiety (spasms). 06/02/19  Yes Delo, Nathaneil Canary, MD  lidocaine (LIDODERM) 5 % Place 1 patch onto the skin daily. Remove & Discard patch within 12 hours or as directed by MD Patient taking differently: Place 1 patch onto the skin daily as needed (pain). Remove & Discard patch within 12 hours or as directed by MD 05/31/19  Yes Idol, Almyra Free, PA-C  Multiple Vitamin (MULTIVITAMIN WITH MINERALS) TABS tablet Take 1 tablet by mouth daily. 05/22/18  Yes Johnson,  Clanford L, MD  oxyCODONE-acetaminophen (PERCOCET/ROXICET) 5-325 MG tablet Take 1 tablet by mouth every 4 (four) hours as needed. Patient taking differently: Take 1 tablet by mouth every 4 (four) hours as needed for severe pain.  05/31/19  Yes Idol, Almyra Free, PA-C  predniSONE (DELTASONE) 5 MG tablet Take 5 tablets daily with breakfast for 5 days, then resume 1 tablet daily Patient taking differently: Take 5 mg by mouth daily with breakfast.  05/22/18  Yes Johnson, Clanford L, MD  PROCRIT 35329 UNIT/ML injection Inject 40,000 Units into the skin every 30 (thirty) days. 05/23/19  Yes [provider]  senna (SENOKOT) 8.6 MG TABS tablet Take 1 tablet (8.6 mg total) by mouth daily. 06/13/19  Yes Heath Lark D, DO    Physical Exam: Vitals:   06/16/19 1945 06/16/19 2000 06/16/19 2049 06/16/19 2200  BP: 106/64 (!) 106/57 (!) 114/59 127/64  Pulse: 82 81 81 76  Resp: 20 18 (!) 22 16  Temp:    99.5 F (37.5 C)  TempSrc:    Oral  SpO2: 97% 94% 97% 100%    Constitutional: NAD, calm, comfortable Vitals:   06/16/19 1945 06/16/19 2000 06/16/19 2049 06/16/19 2200  BP: 106/64 (!) 106/57 (!) 114/59 127/64  Pulse: 82 81 81 76  Resp: 20 18 (!) 22 16  Temp:    99.5 F (37.5 C)  TempSrc:    Oral  SpO2: 97% 94% 97% 100%   Eyes: PERRL, lids and conjunctivae normal ENMT: Mucous membranes are moist. Posterior pharynx clear of any exudate or lesions.Normal dentition.  Neck: normal, supple, no masses, no thyromegaly Respiratory: clear to auscultation bilaterally, no wheezing, no crackles.  Tachypneic Cardiovascular: Regular rate and rhythm, no murmurs / rubs / gallops. No extremity edema. 2+ pedal pulses. No carotid bruits.  Abdomen: no tenderness, no masses palpated. No hepatosplenomegaly. Bowel sounds positive.  Musculoskeletal: no clubbing / cyanosis. No joint deformity upper and lower extremities. Good ROM, no contractures. Normal muscle tone.  Skin: no rashes, lesions, ulcers. No  induration Neurologic: CN 2-12 grossly intact. Sensation intact, DTR normal. Strength 5/5 in all 4.  Psychiatric: Normal judgment and insight. Alert and oriented x 3. Normal mood.    Labs on Admission: I have personally reviewed following labs and imaging studies  CBC: Recent Labs  Lab 06/10/19 0222 06/16/19 1600 06/16/19 2008  WBC 2.1* 12.9*  --   NEUTROABS  --  10.2*  --   HGB 9.6* 10.2* 7.5*  HCT 29.6* 32.7* 22.0*  MCV 88.9 92.6  --   PLT 92* 97*  --    Basic Metabolic Panel: Recent Labs  Lab 06/10/19 0222 06/11/19 0514 06/16/19 1600 06/16/19 2008  NA 138 139 138 138  K 4.4 4.1 3.6 3.6  CL 105 105 105  --   CO2 25 28 22   --   GLUCOSE 126* 92 101*  --   BUN 29* 23 19  --   CREATININE 1.95* 1.17 1.41*  --   CALCIUM 9.1 9.1 9.7  --   MG  --   --  1.8  --    GFR: Estimated Creatinine Clearance: 33.2 mL/min (A) (by C-G formula based on SCr of 1.41 mg/dL (H)). Liver Function Tests: Recent Labs  Lab 06/16/19 1600  AST 19  ALT 14  ALKPHOS 99  BILITOT 1.5*  PROT 6.6  ALBUMIN 3.9   No results for input(s): LIPASE, AMYLASE in the last 168 hours. No results for input(s): AMMONIA in the last 168 hours. Coagulation Profile: Recent Labs  Lab 06/10/19 0222  INR 1.0   Cardiac Enzymes: No results for input(s): CKTOTAL, CKMB, CKMBINDEX, TROPONINI in the last 168 hours. BNP (last 3 results) No results for input(s): PROBNP in the last 8760 hours. HbA1C: No results for input(s): HGBA1C in the last 72 hours. CBG: No results for input(s): GLUCAP in the last 168 hours. Lipid Profile: No results for input(s): CHOL, HDL, LDLCALC, TRIG, CHOLHDL, LDLDIRECT in the last 72 hours. Thyroid Function Tests: Recent Labs    06/16/19 1600  TSH 1.087   Anemia Panel: No results for input(s): VITAMINB12, FOLATE, FERRITIN, TIBC, IRON, RETICCTPCT in the last 72 hours. Urine analysis:    Component Value Date/Time   COLORURINE YELLOW 06/16/2019 1845   APPEARANCEUR CLEAR  06/16/2019 1845   LABSPEC 1.014 06/16/2019 1845   PHURINE 6.0 06/16/2019 1845   GLUCOSEU NEGATIVE 06/16/2019 1845   HGBUR SMALL (A) 06/16/2019 1845   BILIRUBINUR NEGATIVE 06/16/2019 1845   KETONESUR NEGATIVE 06/16/2019 1845   PROTEINUR NEGATIVE 06/16/2019 1845   UROBILINOGEN 0.2 08/15/2013 1240   NITRITE NEGATIVE 06/16/2019 1845   LEUKOCYTESUR NEGATIVE 06/16/2019 1845    Radiological Exams on Admission: CT Angio Chest PE W and/or Wo Contrast  Result Date: 06/16/2019 CLINICAL DATA:  Shortness of breath. EXAM: CT ANGIOGRAPHY CHEST WITH CONTRAST TECHNIQUE: Multidetector CT imaging of the chest was performed using the standard protocol during bolus administration of intravenous contrast. Multiplanar CT image reconstructions and MIPs were obtained to evaluate the vascular anatomy. CONTRAST:  33mL OMNIPAQUE IOHEXOL 350 MG/ML SOLN COMPARISON:  May 19, 2018 FINDINGS: Cardiovascular: Satisfactory opacification of the pulmonary arteries to the segmental level. No evidence of pulmonary embolism. Normal heart size. No pericardial effusion. Mediastinum/Nodes: No enlarged mediastinal, hilar, or axillary lymph nodes. Thyroid gland, trachea, and esophagus demonstrate no significant findings. Lungs/Pleura: Mild, stable areas of biapical scarring and/or atelectasis are seen. Mild atelectasis and/or infiltrate is seen within the posterior aspects of the bilateral upper lobes and posterior aspects of the bilateral lung bases, right greater than left. The 11 mm lung nodule seen within the left upper lobe on the prior study is not clearly identified on the current exam. There is no evidence of a pleural effusion or pneumothorax. Upper Abdomen: Subcentimeter cysts are seen within the upper pole of the left kidney. Musculoskeletal: Multilevel degenerative changes seen throughout the thoracic spine with chronic compression fracture deformity is seen at the levels of T6 and T9. Evidence of prior vertebroplasty is noted at  the level of T11. Review of the MIP images confirms the above findings. IMPRESSION: 1. No CT evidence of pulmonary embolism. 2. Mild bilateral upper lobe and bilateral lower lobe atelectasis and/or infiltrate. Electronically Signed   By: Virgina Norfolk M.D.   On: 06/16/2019 20:57   DG Chest Portable 1 View  Result Date: 06/16/2019 CLINICAL DATA:  Weakness. EXAM: PORTABLE CHEST 1 VIEW COMPARISON:  May 19, 2018 FINDINGS: The lungs are hyperinflated. Mild, chronic appearing increased interstitial lung markings are seen. There is no evidence of acute infiltrate, pleural effusion or pneumothorax. Mild, stable  biapical pleural thickening is noted. The heart size and mediastinal contours are within normal limits. Multilevel degenerative changes are seen throughout the thoracic spine. Interval vertebroplasty is seen within the lower thoracic spine. IMPRESSION: No acute cardiopulmonary disease. Electronically Signed   By: Virgina Norfolk M.D.   On: 06/16/2019 16:10    EKG: Independently reviewed.  Normal sinus no acute ST-T changes  Assessment plan  Hospital-acquired pneumonia Patient was discharged recently from the hospital 06/12/2019 when he spent few days in the hospital for back pain Recent kyphoplasty 1/22 Came with a chief complaint shortness of breath, some productive cough, Fever chills White count 12.9 On 06/10/2019 was 2.1 COVID-19 respiratory panel negative CT chest angio bilateral pulmonary infiltrates Lactic acid 2.0 Plan blood cultures, vancomycin and cefepime, oxygen supplement  Myelodysplastic syndrome Patient with anemia and thrombocytopenia, neutropenia On Procrit every month last dose was yesterday Decreased hemoglobin possible secondary to sepsis At 16 p.m. was 10.2 We'll recheck if less than seven will transfuse unit of blood  COPD exacerbation Patient on no oxygen at home Resume inhaler, oxygen, antibiotics  Chronic kidney disease stage IIIa Avoid nephrotoxins  follow BUN and creatinine  GERD PPI  Chronic back pain, status post kyphoplasty Resume pain medication    Assessment/Plan Principal Problem:   HAP (hospital-acquired pneumonia) Active Problems:   GERD (gastroesophageal reflux disease)   Anemia   COPD exacerbation (HCC)   Myelodysplastic syndrome (HCC)   CKD (chronic kidney disease), stage III   Sepsis (HCC)      DVT prophylaxis: SCD Code Status: Full code Family Communication: Disposition Plan: Home Consults called:  no Admission status: Full admission   Lynzy Rawles G Nels Munn MD [facility]  If 7PM-7AM, please contact night-coverage [email or web address]   06/16/2019, 10:04 PM

## 2019-06-16 NOTE — ED Notes (Signed)
Pt placed on 2L [place] due to oxygen saturations of 88% RA with improvement to 94%.

## 2019-06-16 NOTE — ED Provider Notes (Signed)
[facility] EMERGENCY DEPARTMENT Provider Note   CSN: [id number] Arrival date & time: 06/16/19  1443     History No chief complaint on file.   [name] is a 86 y.o. male.  HPI 86 year old male with history of myelodysplastic syndrome, COPD, recent admission for T12 kyphoplasty via neurosurgery, discharged approximately 4 days ago, presenting to the emergency department for a 1 day history of fatigue, weakness globally, as well as chills at home.  States that he does not have a fever at home, however states that he overall just feels ill.  Decreased p.o. intake, states that he recent had his back surgery and that has been doing fine since he was discharged home, states that he denies any drainage or increased swelling or pain to the back, no trauma, denies any nausea or vomiting, no abdominal pain, no chest pain or shortness of breath, no cough.  No recent illnesses, was tested negative for Covid approximately 7 days ago preprocedure for his surgery.  No dysuria or hematuria, denies any blood in his stool, no black stools.  No dark stools.  Denies any hematemesis or hemoptysis.    Past Medical History:  Diagnosis Date  . Bone cancer ([place])   . Chronic kidney insufficiency   . COPD (chronic obstructive pulmonary disease) ([place])   . GERD (gastroesophageal reflux disease)   . Pulmonary nodule     Patient Active Problem List   Diagnosis Date Noted  . Myelodysplastic syndrome ([place]) 06/16/2019  . HAP (hospital-acquired pneumonia) 06/16/2019  . CKD (chronic kidney disease), stage III 06/16/2019  . Acute exacerbation of chronic low back pain 06/06/2019  . Thrombocytopenia ([place]) 06/06/2019  . Intractable back pain 06/06/2019  . COPD (chronic obstructive pulmonary disease) ([place])   . Chronic kidney insufficiency   . COPD with acute exacerbation ([place]) 05/20/2018  . COPD exacerbation ([place]) 05/19/2018  . Pulmonary nodule 05/19/2018  . Bronchiectasis ([place]) 05/19/2018    . Anemia 09/17/2017  . Abnormal CT of the abdomen 11/02/2016  . Loss of weight 11/02/2016  . History of colonic polyps 11/02/2016  . GERD (gastroesophageal reflux disease) 10/01/2015    Past Surgical History:  Procedure Laterality Date  . BACK SURGERY    . CATARACT EXTRACTION W/PHACO Right 03/09/2016   Procedure: CATARACT EXTRACTION PHACO AND INTRAOCULAR LENS PLACEMENT RIGHT EYE CDE=8.58;  Surgeon: Tonny Branch, MD;  Location: [facility];  Service: Ophthalmology;  Laterality: Right;  right  . CATARACT EXTRACTION W/PHACO Left 04/13/2016   Procedure: CATARACT EXTRACTION PHACO AND INTRAOCULAR LENS PLACEMENT (IOC);  Surgeon: Tonny Branch, MD;  Location: [facility];  Service: Ophthalmology;  Laterality: Left;  CDE: 8.08  . KYPHOPLASTY Bilateral 06/09/2019   Procedure: T11 KYPHOPLASTY;  Surgeon: Consuella Lose, MD;  Location: [facility];  Service: Neurosurgery;  Laterality: Bilateral;  . None to Date  10/01/15       Family History  Problem Relation Age of Onset  . Kidney disease Mother   . Kidney disease Sister   . Colon cancer Neg Hx   . Gastric cancer Neg Hx   . Esophageal cancer Neg Hx     Social History   Tobacco Use  . Smoking status: Former Smoker    Years: 15.00    Types: Cigarettes, Cigars    Quit date: 10/01/1990    Years since quitting: 28.7  . Smokeless tobacco: Never Used  . Tobacco comment: Quit x 25-30 years; 2 cigars daily when smoked  Substance Use Topics  . Alcohol use:  Not Currently    Alcohol/week: 0.0 standard drinks  . Drug use: No    Home Medications Prior to Admission medications   Medication Sig Start Date End Date Taking? Authorizing Provider  albuterol (PROVENTIL HFA;VENTOLIN HFA) 108 (90 Base) MCG/ACT inhaler Inhale 2 puffs into the lungs every 4 (four) hours as needed for wheezing or shortness of breath. 05/21/18  Yes Johnson, Clanford L, MD  cyclobenzaprine (FEXMID) 7.5 MG tablet Take 1 tablet (7.5 mg total) by mouth 3 (three) times daily as needed for muscle  spasms. 06/12/19  Yes Shah, Pratik D, DO  diazepam (VALIUM) 5 MG tablet Take 1 tablet (5 mg total) by mouth every 6 (six) hours as needed for anxiety (spasms). 06/02/19  Yes Delo, Nathaneil Canary, MD  lidocaine (LIDODERM) 5 % Place 1 patch onto the skin daily. Remove & Discard patch within 12 hours or as directed by MD Patient taking differently: Place 1 patch onto the skin daily as needed (pain). Remove & Discard patch within 12 hours or as directed by MD 05/31/19  Yes Idol, Almyra Free, PA-C  Multiple Vitamin (MULTIVITAMIN WITH MINERALS) TABS tablet Take 1 tablet by mouth daily. 05/22/18  Yes Johnson, Clanford L, MD  oxyCODONE-acetaminophen (PERCOCET/ROXICET) 5-325 MG tablet Take 1 tablet by mouth every 4 (four) hours as needed. Patient taking differently: Take 1 tablet by mouth every 4 (four) hours as needed for severe pain.  05/31/19  Yes Idol, Almyra Free, PA-C  predniSONE (DELTASONE) 5 MG tablet Take 5 tablets daily with breakfast for 5 days, then resume 1 tablet daily Patient taking differently: Take 5 mg by mouth daily with breakfast.  05/22/18  Yes Johnson, Clanford L, MD  PROCRIT 62376 UNIT/ML injection Inject 40,000 Units into the skin every 30 (thirty) days. 05/23/19  Yes [provider]  senna (SENOKOT) 8.6 MG TABS tablet Take 1 tablet (8.6 mg total) by mouth daily. 06/13/19  Yes Manuella Ghazi, Pratik D, DO    Allergies    Patient has no known allergies.  Review of Systems   Review of Systems  Constitutional: Positive for chills and fatigue. Negative for fever.  HENT: Negative for ear pain and sore throat.   Eyes: Negative for pain and visual disturbance.  Respiratory: Negative for cough and shortness of breath.   Cardiovascular: Negative for chest pain and palpitations.  Gastrointestinal: Negative for abdominal pain and vomiting.  Genitourinary: Negative for dysuria and hematuria.  Musculoskeletal: Positive for arthralgias and myalgias. Negative for back pain.  Skin: Negative for color change and rash.    Neurological: Negative for seizures and syncope.  All other systems reviewed and are negative.   Physical Exam Updated Vital Signs BP (!) 114/59 (BP Location: Right Arm)   Pulse 81   Temp (!) 102 F (38.9 C) (Rectal)   Resp (!) 22   SpO2 97%   Physical Exam Vitals and nursing note reviewed.  Constitutional:      General: He is not in acute distress.    Appearance: He is well-developed. He is ill-appearing. He is not toxic-appearing or diaphoretic.     Comments: Elderly, frail., ill appearing  HENT:     Head: Normocephalic and atraumatic.     Right Ear: External ear normal.     Left Ear: External ear normal.     Nose: Nose normal. No congestion.     Mouth/Throat:     Mouth: Mucous membranes are dry.     Pharynx: Oropharynx is clear.  Eyes:     Conjunctiva/sclera: Conjunctivae normal.  Cardiovascular:     Rate and Rhythm: Normal rate and regular rhythm.     Heart sounds: No murmur.  Pulmonary:     Effort: Pulmonary effort is normal. No respiratory distress.     Breath sounds: Normal breath sounds.  Abdominal:     Palpations: Abdomen is soft.     Tenderness: There is no abdominal tenderness. There is no guarding or rebound.     Hernia: No hernia is present.  Musculoskeletal:        General: No swelling, tenderness, deformity or signs of injury. Normal range of motion.     Cervical back: Normal range of motion and neck supple.     Comments: Incision site to his lower back clean, dry, intact, non tender, no drainage or redness noted  Skin:    General: Skin is warm and dry.     Capillary Refill: Capillary refill takes less than 2 seconds.  Neurological:     General: No focal deficit present.     Mental Status: He is alert.  Psychiatric:        Mood and Affect: Mood normal.        Behavior: Behavior normal.     ED Results / Procedures / Treatments   Labs (all labs ordered are listed, but only abnormal results are displayed) Labs Reviewed  CBC WITH  DIFFERENTIAL/PLATELET - Abnormal; Notable for the following components:      Result Value   WBC 12.9 (*)    RBC 3.53 (*)    Hemoglobin 10.2 (*)    HCT 32.7 (*)    RDW 20.6 (*)    Platelets 97 (*)    Neutro Abs 10.2 (*)    Lymphs Abs 0.6 (*)    Monocytes Absolute 1.8 (*)    Abs Immature Granulocytes 0.22 (*)    All other components within normal limits  COMPREHENSIVE METABOLIC PANEL - Abnormal; Notable for the following components:   Glucose, Bld 101 (*)    Creatinine, Ser 1.41 (*)    Total Bilirubin 1.5 (*)    GFR calc non Af Amer 44 (*)    GFR calc Af Amer 52 (*)    All other components within normal limits  URINALYSIS, ROUTINE W REFLEX MICROSCOPIC - Abnormal; Notable for the following components:   Hgb urine dipstick SMALL (*)    All other components within normal limits  LACTIC ACID, PLASMA - Abnormal; Notable for the following components:   Lactic Acid, Venous 2.0 (*)    All other components within normal limits  POCT I-STAT 7, (LYTES, BLD GAS, ICA,H+H) - Abnormal; Notable for the following components:   pO2, Arterial 77.0 (*)    HCT 22.0 (*)    Hemoglobin 7.5 (*)    All other components within normal limits  RESPIRATORY PANEL BY RT PCR (FLU A&B, COVID)  CULTURE, BLOOD (ROUTINE X 2)  CULTURE, BLOOD (ROUTINE X 2)  URINE CULTURE  LACTIC ACID, PLASMA  MAGNESIUM  TSH  BRAIN NATRIURETIC PEPTIDE  BLOOD GAS, ARTERIAL  POC SARS CORONAVIRUS 2 AG -  ED  TYPE AND SCREEN  ABO/RH  TROPONIN I (HIGH SENSITIVITY)  TROPONIN I (HIGH SENSITIVITY)    EKG EKG Interpretation  Date/Time:  Friday June 16 2019 15:38:01 EST Ventricular Rate:  96 PR Interval:    QRS Duration: 103 QT Interval:  347 QTC Calculation: 439 R Axis:   81 Text Interpretation: Sinus rhythm Borderline right axis deviation Borderline repolarization abnormality When compared to prior, no significant changes  seen. No STEMI Confirmed by Antony Blackbird [phone]) on 06/16/2019 3:43:26 PM   Radiology CT Angio  Chest PE W and/or Wo Contrast  Result Date: 06/16/2019 CLINICAL DATA:  Shortness of breath. EXAM: CT ANGIOGRAPHY CHEST WITH CONTRAST TECHNIQUE: Multidetector CT imaging of the chest was performed using the standard protocol during bolus administration of intravenous contrast. Multiplanar CT image reconstructions and MIPs were obtained to evaluate the vascular anatomy. CONTRAST:  54mL OMNIPAQUE IOHEXOL 350 MG/ML SOLN COMPARISON:  May 19, 2018 FINDINGS: Cardiovascular: Satisfactory opacification of the pulmonary arteries to the segmental level. No evidence of pulmonary embolism. Normal heart size. No pericardial effusion. Mediastinum/Nodes: No enlarged mediastinal, hilar, or axillary lymph nodes. Thyroid gland, trachea, and esophagus demonstrate no significant findings. Lungs/Pleura: Mild, stable areas of biapical scarring and/or atelectasis are seen. Mild atelectasis and/or infiltrate is seen within the posterior aspects of the bilateral upper lobes and posterior aspects of the bilateral lung bases, right greater than left. The 11 mm lung nodule seen within the left upper lobe on the prior study is not clearly identified on the current exam. There is no evidence of a pleural effusion or pneumothorax. Upper Abdomen: Subcentimeter cysts are seen within the upper pole of the left kidney. Musculoskeletal: Multilevel degenerative changes seen throughout the thoracic spine with chronic compression fracture deformity is seen at the levels of T6 and T9. Evidence of prior vertebroplasty is noted at the level of T11. Review of the MIP images confirms the above findings. IMPRESSION: 1. No CT evidence of pulmonary embolism. 2. Mild bilateral upper lobe and bilateral lower lobe atelectasis and/or infiltrate. Electronically Signed   By: Virgina Norfolk M.D.   On: 06/16/2019 20:57   DG Chest Portable 1 View  Result Date: 06/16/2019 CLINICAL DATA:  Weakness. EXAM: PORTABLE CHEST 1 VIEW COMPARISON:  May 19, 2018  FINDINGS: The lungs are hyperinflated. Mild, chronic appearing increased interstitial lung markings are seen. There is no evidence of acute infiltrate, pleural effusion or pneumothorax. Mild, stable biapical pleural thickening is noted. The heart size and mediastinal contours are within normal limits. Multilevel degenerative changes are seen throughout the thoracic spine. Interval vertebroplasty is seen within the lower thoracic spine. IMPRESSION: No acute cardiopulmonary disease. Electronically Signed   By: Virgina Norfolk M.D.   On: 06/16/2019 16:10    Procedures Procedures (including critical care time)  Medications Ordered in ED Medications  lactated ringers bolus 500 mL (0 mLs Intravenous Stopped 06/16/19 1834)  acetaminophen (TYLENOL) tablet 1,000 mg (1,000 mg Oral Given 06/16/19 1731)  lactated ringers bolus 500 mL (0 mLs Intravenous Stopped 06/16/19 1914)  iohexol (OMNIPAQUE) 350 MG/ML injection 75 mL (75 mLs Intravenous Contrast Given 06/16/19 2023)    ED Course  I have reviewed the triage vital signs and the nursing notes.  Pertinent labs & imaging results that were available during my care of the patient were reviewed by me and considered in my medical decision making (see chart for details).    MDM Rules/Calculators/A&P                      86yo M presenting to the ED for fatigue, weakness, s/p kyphoplasty within the last week. On arrival HD stable, afebrile, ill appearing in terms of just being elderly, and frail. No focal deficits on exam, no rashes or abdominal TTP. Back incision site does not appear infected or swollen, no trauma, will hold on imaging of his back, currently not indicated. Unsure etiology of patient's symptoms. Could be  COVID, infectious etiology given his recent hospitalization, will obtain CXR, urine studies and basic labs. Good be related to poor PO intake, functional decline as well. COVID swab sent. Currently no indication for sepsis work up, vitals are  stable. Will give fluid rehydration as well and reassess.   Lactic acid at 2, repeat at 1.4 after fluid resuscitation, CBC reassuring, metabolic panel with a slight AKI, patient was febrile per rectum, given Tylenol.  Unsure etiology of patient's fever at this time, doubt postoperative infection, the back appears benign, no swelling or redness noted, no increase in pain.  Urine studies reassuring as well, chest x-ray with no focal consolidation concerning for pneumonia.  Patient is not meningitic, not altered in has no nuchal rigidity, doubt meningitis.  Fever of unknown source at this time, CTPA was also obtained given his fever and hypoxia when walking and talking, currently on 2 L nasal cannula, PE study negative, no other signs of acute abnormality in the chest.  Covid negative as well.  Given patient's elevated lactic acid, new oxygen requirement as well as fever of unknown etiology, blood cultures and urine cultures were obtained, and patient will be admitted to the hospitalist.  Patient also needs fluid rehydration, continued observation in the hospital.  Admitted in stable condition.  The attending physician was present and available for all medical decision making and procedures related to this patient's care.      Final Clinical Impression(s) / ED Diagnoses Final diagnoses:  Fever, unspecified fever cause  Dehydration    Rx / DC Orders ED Discharge Orders    None       Kizzie Fantasia, MD 06/16/19 2116    Tegeler, Gwenyth Allegra, MD 06/16/19 2342

## 2019-06-16 NOTE — Progress Notes (Signed)
Pharmacy Antibiotic Note  [name] is a 86 y.o. male admitted on 06/16/2019 with pneumonia.  Pharmacy has been consulted for Cefepime Vancomycin dosing.     Temp (24hrs), Avg:100.7 F (38.2 C), Min:99.3 F (37.4 C), Max:102 F (38.9 C)  Recent Labs  Lab 06/10/19 0222 06/11/19 0514 06/16/19 1600 06/16/19 1758  WBC 2.1*  --  12.9*  --   CREATININE 1.95* 1.17 1.41*  --   LATICACIDVEN  --   --  2.0* 1.4    Estimated Creatinine Clearance: 33.2 mL/min (A) (by C-G formula based on SCr of 1.41 mg/dL (H)).    No Known Allergies  Antimicrobials this admission: 1/29 Cefepime >>  1/29 Vancomycin >>   Dose adjustments this admission:   Microbiology results: Pending  Plan:  - Cefepime 2g q12h  - Vancomycin 1250mg  IV x 1 dose  - Followed by Vancomycin 1000mg  IV q24h - Est Calc AUC 520 - Monitor patients renal function and urine output   Thank you for allowing pharmacy to be a part of this patient's care.  Duanne Limerick PharmD. BCPS  06/16/2019 9:27 PM

## 2019-06-16 NOTE — Plan of Care (Signed)

## 2019-06-16 NOTE — ED Notes (Signed)
[name] daughter [phone] looking for an update

## 2019-06-17 DIAGNOSIS — K219 Gastro-esophageal reflux disease without esophagitis: Secondary | ICD-10-CM

## 2019-06-17 DIAGNOSIS — D469 Myelodysplastic syndrome, unspecified: Secondary | ICD-10-CM

## 2019-06-17 DIAGNOSIS — J9601 Acute respiratory failure with hypoxia: Secondary | ICD-10-CM

## 2019-06-17 DIAGNOSIS — A419 Sepsis, unspecified organism: Principal | ICD-10-CM

## 2019-06-17 DIAGNOSIS — R6520 Severe sepsis without septic shock: Secondary | ICD-10-CM

## 2019-06-17 DIAGNOSIS — N1831 Chronic kidney disease, stage 3a: Secondary | ICD-10-CM

## 2019-06-17 DIAGNOSIS — D649 Anemia, unspecified: Secondary | ICD-10-CM

## 2019-06-17 LAB — CBC WITH DIFFERENTIAL/PLATELET
Abs Immature Granulocytes: 0.39 10*3/uL — ABNORMAL HIGH (ref 0.00–0.07)
Basophils Absolute: 0.1 10*3/uL (ref 0.0–0.1)
Basophils Relative: 1 %
Eosinophils Absolute: 0 10*3/uL (ref 0.0–0.5)
Eosinophils Relative: 0 %
HCT: 28.4 % — ABNORMAL LOW (ref 39.0–52.0)
Hemoglobin: 9.3 g/dL — ABNORMAL LOW (ref 13.0–17.0)
Immature Granulocytes: 3 %
Lymphocytes Relative: 4 %
Lymphs Abs: 0.5 10*3/uL — ABNORMAL LOW (ref 0.7–4.0)
MCH: 29.5 pg (ref 26.0–34.0)
MCHC: 32.7 g/dL (ref 30.0–36.0)
MCV: 90.2 fL (ref 80.0–100.0)
Monocytes Absolute: 2 10*3/uL — ABNORMAL HIGH (ref 0.1–1.0)
Monocytes Relative: 16 %
Neutro Abs: 9.7 10*3/uL — ABNORMAL HIGH (ref 1.7–7.7)
Neutrophils Relative %: 76 %
Platelets: 87 10*3/uL — ABNORMAL LOW (ref 150–400)
RBC: 3.15 MIL/uL — ABNORMAL LOW (ref 4.22–5.81)
RDW: 20.1 % — ABNORMAL HIGH (ref 11.5–15.5)
WBC: 12.8 10*3/uL — ABNORMAL HIGH (ref 4.0–10.5)
nRBC: 0 % (ref 0.0–0.2)

## 2019-06-17 LAB — COMPREHENSIVE METABOLIC PANEL
ALT: 12 U/L (ref 0–44)
AST: 17 U/L (ref 15–41)
Albumin: 3.2 g/dL — ABNORMAL LOW (ref 3.5–5.0)
Alkaline Phosphatase: 86 U/L (ref 38–126)
Anion gap: 7 (ref 5–15)
BUN: 17 mg/dL (ref 8–23)
CO2: 23 mmol/L (ref 22–32)
Calcium: 9 mg/dL (ref 8.9–10.3)
Chloride: 105 mmol/L (ref 98–111)
Creatinine, Ser: 1.29 mg/dL — ABNORMAL HIGH (ref 0.61–1.24)
GFR calc Af Amer: 57 mL/min — ABNORMAL LOW (ref >60)
GFR calc non Af Amer: 50 mL/min — ABNORMAL LOW (ref >60)
Glucose, Bld: 100 mg/dL — ABNORMAL HIGH (ref 70–99)
Potassium: 3.8 mmol/L (ref 3.5–5.1)
Sodium: 135 mmol/L (ref 135–145)
Total Bilirubin: 1.7 mg/dL — ABNORMAL HIGH (ref 0.3–1.2)
Total Protein: 5.7 g/dL — ABNORMAL LOW (ref 6.5–8.1)

## 2019-06-17 LAB — URINE CULTURE: Culture: NO GROWTH

## 2019-06-17 LAB — PROCALCITONIN: Procalcitonin: 0.5 ng/mL

## 2019-06-17 MED ORDER — IPRATROPIUM-ALBUTEROL 0.5-2.5 (3) MG/3ML IN SOLN
3.0000 mL | Freq: Four times a day (QID) | RESPIRATORY_TRACT | Status: DC
  Administered 2019-06-17: 3 mL via RESPIRATORY_TRACT
  Filled 2019-06-17: qty 3

## 2019-06-17 MED ORDER — PANTOPRAZOLE SODIUM 40 MG PO TBEC
40.0000 mg | DELAYED_RELEASE_TABLET | Freq: Every day | ORAL | Status: DC
  Administered 2019-06-17 – 2019-06-21 (×5): 40 mg via ORAL
  Filled 2019-06-17 (×5): qty 1

## 2019-06-17 MED ORDER — GUAIFENESIN ER 600 MG PO TB12
1200.0000 mg | ORAL_TABLET | Freq: Two times a day (BID) | ORAL | Status: DC
  Administered 2019-06-17 – 2019-06-21 (×9): 1200 mg via ORAL
  Filled 2019-06-17 (×10): qty 2

## 2019-06-17 MED ORDER — SODIUM CHLORIDE 0.9 % IV SOLN: INTRAVENOUS | Status: AC

## 2019-06-17 MED ORDER — IPRATROPIUM-ALBUTEROL 0.5-2.5 (3) MG/3ML IN SOLN
3.0000 mL | Freq: Three times a day (TID) | RESPIRATORY_TRACT | Status: DC
  Administered 2019-06-17 – 2019-06-18 (×2): 3 mL via RESPIRATORY_TRACT
  Filled 2019-06-17 (×2): qty 3

## 2019-06-17 NOTE — Progress Notes (Signed)
PROGRESS NOTE    [name]  GYJ:[id number] DOB: 12-05-1929 DOA: 06/16/2019 PCP: Sharilyn Sites, MD   Brief Narrative:  HPI per Dr. Binnie Kand on 06/16/2019 [name] is a 86 y.o. male with medical history significant of COPD, myelodysplastic syndrome, recent admission for T12 kyphoplasty discharged 4 days ago, came to emergency room for fatigue general weakness chills for 1 day.  In the emergency room was found to have a fever 102.  He has back pain secondary to surgery but denies any drainage or increased swelling or any recent injury to his back.  Denies any nausea vomiting or diarrhea.  No urinary symptoms.  Denies any blood in his stools.  Denies any hematemesis or hemoptysis.  ED Course: in the emergency room he was found to be tachypneic hypoxic, [id number] Started on oxygen nasal cannula saturation 94% Lactic acid was 2.0, procalcitonin pending Blood cultures were done Chest x-ray negative CT chest angio no pulmonary embolus bilateral infiltrates suspicion of pneumonia Patient started on vancomycin and cefepime with suspicion of hospital-acquired pneumonia On 06/10/2019 hemoglobin 9.6 white count 2.1 On 06/16/2019 hemoglobin 10.2 white count 12.9 Covid test respiratory panel negative  **Interim History Patient was started on HCAP treatment and will continue.  Also have added guaifenesin, flutter valve and incentive spirometer along with duo nebs.  Daughter updated and will repeat chest x-ray in the a.m.  Assessment & Plan:   Principal Problem:   HAP (hospital-acquired pneumonia) Active Problems:   GERD (gastroesophageal reflux disease)   Anemia   COPD exacerbation (HCC)   Myelodysplastic syndrome (HCC)   CKD (chronic kidney disease), stage III   Sepsis ([place])  Acute Respiratory Failure with Hypoxia Sepsis 2/2 to HCAP Hospital-Acquired pneumonia, poA -Patient was discharged recently from the hospital 06/12/2019 when he spent few days in the hospital for  back pain -Recent kyphoplasty 1/22 -Came with a chief complaint shortness of breath, some productive cough, fever, and chills -Septic on Admission with a TMax of 102, Pulse of 102, and RR of 27 and a White count 12.9 -WBC On 06/10/2019 was 2.1 -COVID-19 respiratory panel negative -LA was elevated and now improved to 1.4 -PCT was 0.50 -CT Chest Angio showed "No CT evidence of pulmonary embolism. Mild bilateral upper lobe and bilateral lower lobe atelectasis and/or infiltrate." Lactic acid 2.0 -Received Two 500 mL LR boluses and placed on NS at 125 mL/hr for 12 hours and have reduced rate to 75 mL/hr x1 day -Add Guaifenesin 1200 mg po BID, flutter valve and incentive spirometry -Obtained Blood cultures and Urine Cx -UA not revealing for UTI -Started on IV Vancomycin and Cefepime and will continue for now likely 2/2 to HCAP -ABG done and showed a pH of 7.445, PCO2 35.1, PO2 77.0, bicarbonate level 23.7, and an ABG O2 saturation of 95% -C/w Supplemental O2 via [place] -Also continue Albuterol 2.5 mg IH q4hprn and will start Scheduled DuoNeb q6h -Currently takes Prednisone 5 mg po Daily  -Continue supplemental oxygen via nasal cannula and wean O2 as tolerated -SpO2: 90 % O2 Flow Rate (L/min): 2 L/min -Continuous pulse oximetry and maintain O2 saturations greater than 92% -We will need ambulatory home O2 screen prior to discharge -PT/OT to Evaluate and Treat  MyeloDysplastic Syndrome History of pancytopenia -Patient with anemia and thrombocytopenia, neutropenia on last admission but here he has a leukocytosis in the setting of his infection and continues to be anemic and thrombocytopenic -On Procrit every month last dose the day before yesterday -Patient's WBC was  12.8, Hb/Hct wasd 9.3/28.4, and Platelet Count was 87,000 -Sees Hematology/oncology in [place] -Continue to Monitor for S/Sx of Bleeding -Repeat CBC in AM   COPD -Not on home oxygen usually.  -Home medications include albuterol  inhaler.  -He tells me he takes Spiriva as well but not listed on his home MAR -C/w DuoNeb as above and as needed Albuterol q4hprn  -Currently not wheezing so we will hold off on steroids -C/w Antibiotics for suspected bacterial pneumonia  Chronic Kidney Disease Stage IIIa.  -Chart review indicates he sees Nephrologist in [place].  -Creatinine baseline 1.6-2.0 range.  -Patient's BUN/Cr went from 19/1.41 -> 17/1.29 -Avoid Nephrotoxic Medications, Contrast Dyes, Hypotension and Renally Adjust Medications -Continue to Monitor and Trend Renal Function -Repeat CMP in AM   GERD -C/w PPI with Pantoprazole 40 mg po Daily   Chronic Low Back pain in the setting of T11 Compression Fracture s/p Recent Kyphoplasty 06/09/2019 -A little over a month ago patient underwent L2 vertebral augmentation per Dr. Kathyrn Sheriff and was doing well until he went to pick up a 4 foot long 2 x 4 a fewe weeks ago. He reports he felt a "pop" in his back and developed immediate back pain a few days ago when he was lifting a piece of ood at [employer].  -Evaluated by neurosurgery last [facility] who opine current imaging shows chronic compression fractures and questionable acute T12 compression fracture.  -MRI Thoracic and Lumbar spine at that time showed "Truncated and motion degraded examination. Acute or subacute compression fracture of T11 with mild edema, approximately 25% height loss and 6 mm retropulsion. No associated spinal canal stenosis.  Multiple chronic compression fractures of the thoracic and lumbar spine. Heterogeneous bone marrow signal within the lower thoracic spine and lumbar spine, compatible with reported myelodysplastic syndrome." -Underwent Recent Kyphoplasty on 06/09/2019 -Continue with oxycodone-acetaminophen 1 tab p.o. every 4 hours as needed for severe pain along with a Lidoderm patch 1 patch transdermally every 12 hours as well as cyclobenzaprine 7.5 mg p.o. 3 times daily as needed for muscle  spasms as well as diazepam 5 mg p.o. every 6 hours as needed for anxiety and muscle spasms -He was given acetaminophen 1000 mg once yesterday for his fever and continues to have acetaminophen 650 mg p.o./RC every 6 hours as needed for mild pain  Hyperbilirubinemia -Patient's T Bili went from 1.5 -> 1.7 -Continue to monitor and trend and repeat CMP in a.m.  DVT prophylaxis: SCDs given his Myelodysplastic Syndrome and Anemia and Thrombocytopenia  Code Status: FULL CODE  Family Communication: Discussed with Daughter [facility]) over the telephone Disposition Plan: Patient is from home and will require PT and OT evaluation prior to safe discharge disposition no need to clinically improve from his respiratory standpoint and be weaned off of oxygen and be afebrile for at least 24 hours prior to discharge.   Consultants:   None   Procedures:  None   Antimicrobials:  Anti-infectives (From admission, onward)   Start     Dose/Rate Route Frequency Ordered Stop   06/17/19 2130  vancomycin (VANCOCIN) IVPB 1000 mg/200 mL premix     1,000 mg 200 mL/hr over 60 Minutes Intravenous Every 24 hours 06/16/19 2126     06/16/19 2200  ceFEPIme (MAXIPIME) 2 g in sodium chloride 0.9 % 100 mL IVPB     2 g 200 mL/hr over 30 Minutes Intravenous Every 12 hours 06/16/19 2126     06/16/19 2130  vancomycin (VANCOREADY) IVPB 1250 mg/250 mL  1,250 mg 166.7 mL/hr over 90 Minutes Intravenous  Once 06/16/19 2126 06/17/19 0045     Subjective: Seen and examined at bedside he is feeling fatigued and states that he is not feeling well.  Unable to cough up any productive sputum.  No chest pain, lightheadedness but was wanting to rest.  No other concerns or complaints at this time.  Objective: Vitals:   06/16/19 2049 06/16/19 2200 06/16/19 2323 06/17/19 0721  BP: (!) 114/59 127/64 134/77 (!) 96/50  Pulse: 81 76 86 91  Resp: (!) 22 16 18 16   Temp:  99.5 F (37.5 C) 99 F (37.2 C) 99.5 F (37.5 C)  TempSrc:  Oral  Oral Oral  SpO2: 97% 100% 93% 90%  Weight:   62 kg   Height:   6\' 4"  (1.93 m)     Intake/Output Summary (Last 24 hours) at 06/17/2019 5465 Last data filed at 06/17/2019 0400 Gross per 24 hour  Intake 809.98 ml  Output 700 ml  Net 109.98 ml   Filed Weights   06/16/19 2323  Weight: 62 kg   Examination: Physical Exam:  Constitutional: An elderly frail Caucasian male currently in NAD and appears slightly uncomfortable Eyes: Lids and conjunctivae normal, sclerae anicteric  ENMT: External Ears, Nose appear normal. Grossly normal hearing. Mucous membranes are moist.  Neck: Appears normal, supple, no cervical masses, normal ROM, no appreciable thyromegaly; no JVD Respiratory: Diminished to auscultation bilaterall but has some mild rhonchi but no wheezing, rales, or crackles. Normal respiratory effort and patient is not tachypenic. No accessory muscle use. Wearing 2 Liters of Supplemental O2 via [place] Cardiovascular: RRR, no murmurs / rubs / gallops. S1 and S2 auscultated. Trace extremity edema.  Abdomen: Soft, non-tender, non-distended. No masses palpated. No appreciable hepatosplenomegaly. Bowel sounds positive.  GU: Deferred. Musculoskeletal: No clubbing / cyanosis of digits/nails. No joint deformity upper and lower extremities.  Skin: No rashes, lesions, ulcers on a limited skin evaluation. No induration; Warm and dry.  Neurologic: CN 2-12 grossly intact with no focal deficits. Romberg sign and cerebellar reflexes not assessed.  Psychiatric: Normal judgment and insight. Alert and oriented x 3. Normal mood and appropriate affect.   Data Reviewed: I have personally reviewed following labs and imaging studies  CBC: Recent Labs  Lab 06/16/19 1600 06/16/19 2008 06/17/19 0032  WBC 12.9*  --  12.8*  NEUTROABS 10.2*  --  9.7*  HGB 10.2* 7.5* 9.3*  HCT 32.7* 22.0* 28.4*  MCV 92.6  --  90.2  PLT 97*  --  87*   Basic Metabolic Panel: Recent Labs  Lab 06/11/19 0514 06/16/19 1600  06/16/19 2008 06/17/19 0032  NA 139 138 138 135  K 4.1 3.6 3.6 3.8  CL 105 105  --  105  CO2 28 22  --  23  GLUCOSE 92 101*  --  100*  BUN 23 19  --  17  CREATININE 1.17 1.41*  --  1.29*  CALCIUM 9.1 9.7  --  9.0  MG  --  1.8  --   --    GFR: Estimated Creatinine Clearance: 35.4 mL/min (A) (by C-G formula based on SCr of 1.29 mg/dL (H)). Liver Function Tests: Recent Labs  Lab 06/16/19 1600 06/17/19 0032  AST 19 17  ALT 14 12  ALKPHOS 99 86  BILITOT 1.5* 1.7*  PROT 6.6 5.7*  ALBUMIN 3.9 3.2*   No results for input(s): LIPASE, AMYLASE in the last 168 hours. No results for input(s): AMMONIA in the last  168 hours. Coagulation Profile: No results for input(s): INR, PROTIME in the last 168 hours. Cardiac Enzymes: No results for input(s): CKTOTAL, CKMB, CKMBINDEX, TROPONINI in the last 168 hours. BNP (last 3 results) No results for input(s): PROBNP in the last 8760 hours. HbA1C: No results for input(s): HGBA1C in the last 72 hours. CBG: No results for input(s): GLUCAP in the last 168 hours. Lipid Profile: No results for input(s): CHOL, HDL, LDLCALC, TRIG, CHOLHDL, LDLDIRECT in the last 72 hours. Thyroid Function Tests: Recent Labs    06/16/19 1600  TSH 1.087   Anemia Panel: No results for input(s): VITAMINB12, FOLATE, FERRITIN, TIBC, IRON, RETICCTPCT in the last 72 hours. Sepsis Labs: Recent Labs  Lab 06/16/19 1600 06/16/19 1758 06/17/19 0032  PROCALCITON  --   --  0.50  LATICACIDVEN 2.0* 1.4  --     Recent Results (from the past 240 hour(s))  Surgical pcr screen     Status: None   Collection Time: 06/09/19 10:33 AM   Specimen: Nasal Mucosa; Nasal Swab  Result Value Ref Range Status   MRSA, PCR NEGATIVE NEGATIVE Final   Staphylococcus aureus NEGATIVE NEGATIVE Final    Comment: (NOTE) The Xpert SA Assay (FDA approved for NASAL specimens in patients 61 years of age and older), is one component of a comprehensive surveillance program. It is not intended to  diagnose infection nor to guide or monitor treatment. Performed at [facility], [place] [address], [place], [place] [address]   Respiratory Panel by RT PCR (Flu A&B, Covid) - Nasopharyngeal Swab     Status: None   Collection Time: 06/16/19  4:28 PM   Specimen: Nasopharyngeal Swab  Result Value Ref Range Status   SARS Coronavirus 2 by RT PCR NEGATIVE NEGATIVE Final    Comment: (NOTE) SARS-CoV-2 target nucleic acids are NOT DETECTED. The SARS-CoV-2 RNA is generally detectable in upper respiratoy specimens during the acute phase of infection. The lowest concentration of SARS-CoV-2 viral copies this assay can detect is 131 copies/mL. A negative result does not preclude SARS-Cov-2 infection and should not be used as the sole basis for treatment or other patient management decisions. A negative result may occur with  improper specimen collection/handling, submission of specimen other than nasopharyngeal swab, presence of viral mutation(s) within the areas targeted by this assay, and inadequate number of viral copies (<131 copies/mL). A negative result must be combined with clinical observations, patient history, and epidemiological information. The expected result is Negative. Fact Sheet for Patients:  PinkCheek.be Fact Sheet for Healthcare Providers:  GravelBags.it This test is not yet ap proved or cleared by the [place] FDA and  has been authorized for detection and/or diagnosis of SARS-CoV-2 by FDA under an Emergency Use Authorization (EUA). This EUA will remain  in effect (meaning this test can be used) for the duration of the COVID-19 declaration under Section 564(b)(1) of the Act, 21 U.S.C. section 360bbb-3(b)(1), unless the authorization is terminated or revoked sooner.    Influenza A by PCR NEGATIVE NEGATIVE Final   Influenza B by PCR NEGATIVE NEGATIVE Final    Comment: (NOTE) The Xpert Xpress  SARS-CoV-2/FLU/RSV assay is intended as an aid in  the diagnosis of influenza from Nasopharyngeal swab specimens and  should not be used as a sole basis for treatment. Nasal washings and  aspirates are unacceptable for Xpert Xpress SARS-CoV-2/FLU/RSV  testing. Fact Sheet for Patients: PinkCheek.be Fact Sheet for Healthcare Providers: GravelBags.it This test is not yet approved or cleared by the [place]  States FDA and  has been authorized for detection and/or diagnosis of SARS-CoV-2 by  FDA under an Emergency Use Authorization (EUA). This EUA will remain  in effect (meaning this test can be used) for the duration of the  Covid-19 declaration under Section 564(b)(1) of the Act, 21  U.S.C. section 360bbb-3(b)(1), unless the authorization is  terminated or revoked. Performed at [facility], [place] [address] [place], [place] [address]      Radiology Studies: CT Angio Chest PE W and/or Wo Contrast  Result Date: 06/16/2019 CLINICAL DATA:  Shortness of breath. EXAM: CT ANGIOGRAPHY CHEST WITH CONTRAST TECHNIQUE: Multidetector CT imaging of the chest was performed using the standard protocol during bolus administration of intravenous contrast. Multiplanar CT image reconstructions and MIPs were obtained to evaluate the vascular anatomy. CONTRAST:  38mL OMNIPAQUE IOHEXOL 350 MG/ML SOLN COMPARISON:  May 19, 2018 FINDINGS: Cardiovascular: Satisfactory opacification of the pulmonary arteries to the segmental level. No evidence of pulmonary embolism. Normal heart size. No pericardial effusion. Mediastinum/Nodes: No enlarged mediastinal, hilar, or axillary lymph nodes. Thyroid gland, trachea, and esophagus demonstrate no significant findings. Lungs/Pleura: Mild, stable areas of biapical scarring and/or atelectasis are seen. Mild atelectasis and/or infiltrate is seen within the posterior aspects of the bilateral upper lobes and posterior aspects  of the bilateral lung bases, right greater than left. The 11 mm lung nodule seen within the left upper lobe on the prior study is not clearly identified on the current exam. There is no evidence of a pleural effusion or pneumothorax. Upper Abdomen: Subcentimeter cysts are seen within the upper pole of the left kidney. Musculoskeletal: Multilevel degenerative changes seen throughout the thoracic spine with chronic compression fracture deformity is seen at the levels of T6 and T9. Evidence of prior vertebroplasty is noted at the level of T11. Review of the MIP images confirms the above findings. IMPRESSION: 1. No CT evidence of pulmonary embolism. 2. Mild bilateral upper lobe and bilateral lower lobe atelectasis and/or infiltrate. Electronically Signed   By: Virgina Norfolk M.D.   On: 06/16/2019 20:57   DG Chest Portable 1 View  Result Date: 06/16/2019 CLINICAL DATA:  Weakness. EXAM: PORTABLE CHEST 1 VIEW COMPARISON:  May 19, 2018 FINDINGS: The lungs are hyperinflated. Mild, chronic appearing increased interstitial lung markings are seen. There is no evidence of acute infiltrate, pleural effusion or pneumothorax. Mild, stable biapical pleural thickening is noted. The heart size and mediastinal contours are within normal limits. Multilevel degenerative changes are seen throughout the thoracic spine. Interval vertebroplasty is seen within the lower thoracic spine. IMPRESSION: No acute cardiopulmonary disease. Electronically Signed   By: Virgina Norfolk M.D.   On: 06/16/2019 16:10   Scheduled Meds: . multivitamin with minerals  1 tablet Oral Daily  . predniSONE  5 mg Oral Q breakfast  . senna  1 tablet Oral Daily  . sodium chloride flush  3 mL Intravenous Q12H   Continuous Infusions: . sodium chloride 125 mL/hr at 06/17/19 0056  . ceFEPime (MAXIPIME) IV Stopped (06/16/19 2200)  . vancomycin       LOS: 1 day    Kerney Elbe, DO [facility] PAGER is on Eugenio Saenz  If 7PM-7AM, please  contact night-coverage [email or web address]

## 2019-06-18 ENCOUNTER — Inpatient Hospital Stay (HOSPITAL_COMMUNITY): Payer: Medicare Other

## 2019-06-18 DIAGNOSIS — J441 Chronic obstructive pulmonary disease with (acute) exacerbation: Secondary | ICD-10-CM

## 2019-06-18 LAB — CBC WITH DIFFERENTIAL/PLATELET
Abs Immature Granulocytes: 0.15 10*3/uL — ABNORMAL HIGH (ref 0.00–0.07)
Basophils Absolute: 0 10*3/uL (ref 0.0–0.1)
Basophils Relative: 1 %
Eosinophils Absolute: 0 10*3/uL (ref 0.0–0.5)
Eosinophils Relative: 0 %
HCT: 22.7 % — ABNORMAL LOW (ref 39.0–52.0)
Hemoglobin: 7.3 g/dL — ABNORMAL LOW (ref 13.0–17.0)
Immature Granulocytes: 2 %
Lymphocytes Relative: 7 %
Lymphs Abs: 0.5 10*3/uL — ABNORMAL LOW (ref 0.7–4.0)
MCH: 28.7 pg (ref 26.0–34.0)
MCHC: 32.2 g/dL (ref 30.0–36.0)
MCV: 89.4 fL (ref 80.0–100.0)
Monocytes Absolute: 0.9 10*3/uL (ref 0.1–1.0)
Monocytes Relative: 14 %
Neutro Abs: 4.8 10*3/uL (ref 1.7–7.7)
Neutrophils Relative %: 76 %
Platelets: 66 10*3/uL — ABNORMAL LOW (ref 150–400)
RBC: 2.54 MIL/uL — ABNORMAL LOW (ref 4.22–5.81)
RDW: 19.8 % — ABNORMAL HIGH (ref 11.5–15.5)
WBC: 6.3 10*3/uL (ref 4.0–10.5)
nRBC: 0 % (ref 0.0–0.2)

## 2019-06-18 LAB — COMPREHENSIVE METABOLIC PANEL
ALT: 12 U/L (ref 0–44)
AST: 12 U/L — ABNORMAL LOW (ref 15–41)
Albumin: 2.4 g/dL — ABNORMAL LOW (ref 3.5–5.0)
Alkaline Phosphatase: 67 U/L (ref 38–126)
Anion gap: 8 (ref 5–15)
BUN: 18 mg/dL (ref 8–23)
CO2: 21 mmol/L — ABNORMAL LOW (ref 22–32)
Calcium: 8.5 mg/dL — ABNORMAL LOW (ref 8.9–10.3)
Chloride: 110 mmol/L (ref 98–111)
Creatinine, Ser: 1.27 mg/dL — ABNORMAL HIGH (ref 0.61–1.24)
GFR calc Af Amer: 58 mL/min — ABNORMAL LOW (ref >60)
GFR calc non Af Amer: 50 mL/min — ABNORMAL LOW (ref >60)
Glucose, Bld: 88 mg/dL (ref 70–99)
Potassium: 3.7 mmol/L (ref 3.5–5.1)
Sodium: 139 mmol/L (ref 135–145)
Total Bilirubin: 0.9 mg/dL (ref 0.3–1.2)
Total Protein: 4.8 g/dL — ABNORMAL LOW (ref 6.5–8.1)

## 2019-06-18 LAB — MAGNESIUM: Magnesium: 1.9 mg/dL (ref 1.7–2.4)

## 2019-06-18 LAB — PHOSPHORUS: Phosphorus: 2.8 mg/dL (ref 2.5–4.6)

## 2019-06-18 MED ORDER — FENTANYL CITRATE (PF) 100 MCG/2ML IJ SOLN
12.5000 ug | INTRAMUSCULAR | Status: DC | PRN
  Administered 2019-06-19: 12.5 ug via INTRAVENOUS
  Filled 2019-06-18: qty 2

## 2019-06-18 MED ORDER — IPRATROPIUM-ALBUTEROL 0.5-2.5 (3) MG/3ML IN SOLN: 3.0000 mL | RESPIRATORY_TRACT | Status: DC | PRN

## 2019-06-18 MED ORDER — DIPHENHYDRAMINE HCL 25 MG PO CAPS: 25.0000 mg | ORAL_CAPSULE | Freq: Every evening | ORAL | Status: DC | PRN

## 2019-06-18 NOTE — Plan of Care (Signed)
Patient continues to have soft pressures. Afebrile today (had low grade temp yesterday). No tachycardia.

## 2019-06-18 NOTE — Evaluation (Signed)
Physical Therapy Evaluation Patient Details Name: [name] MRN: [id number] DOB: 09/07/1929 Today's Date: 06/18/2019   History of Present Illness  86 y.o. male with medical history significant of COPD, myelodysplastic syndrome, recent admission for T12 kyphoplasty (06/09/19), came to emergency room for fatigue general weakness chills for 1 day.  In the emergency room was found to have a fever 102.    Clinical Impression  Pt admitted with above diagnosis. On eval, pt required supervision bed mobility, min guard assist transfers and min guard assist ambulation 25' with RW. Pt currently requiring 2L O2. Of note, Hgb 7.3 at time of eval. Pt currently with functional limitations due to the deficits listed below (see PT Problem List). Pt will benefit from skilled PT to increase their independence and safety with mobility to allow discharge to the venue listed below.  Following recent admission for kyphoplasty, pt discharged home alone and had not began [facility] services at time of current admission. Pt plans to discharge to his daughter's house in [place], where he will have a level entry lower level apartment. Daughter will be available for 24-hour assist, as needed.     Follow Up Recommendations Supervision for mobility/OOB;Home health PT    Equipment Recommendations  None recommended by PT    Recommendations for Other Services       Precautions / Restrictions Precautions Precautions: Back;Fall Precaution Comments: reviewed back precautions Restrictions Other Position/Activity Restrictions: Pt reports being issued TLSO after kyphoplasty but unable to tolerate wearing it. He states it makes him feel like he cannot breathe.      Mobility  Bed Mobility Overal bed mobility: Needs Assistance Bed Mobility: Supine to Sit     Supine to sit: HOB elevated;Supervision     General bed mobility comments: +rail, supervision for safety  Transfers Overall transfer level: Needs assistance Equipment  used: Rolling walker (2 wheeled) Transfers: Sit to/from Stand Sit to Stand: Min guard         General transfer comment: min guard for safety, cues for hand placement  Ambulation/Gait Ambulation/Gait assistance: Min guard Gait Distance (Feet): 80 Feet Assistive device: Rolling walker (2 wheeled) Gait Pattern/deviations: Step-through pattern;Decreased stride length;Trunk flexed Gait velocity: decreased Gait velocity interpretation: <1.31 ft/sec, indicative of household ambulator General Gait Details: Pt ambulated on RA with desat to 85%. Pt returned to 2L with SpO2 increase to 94%.  Stairs            Wheelchair Mobility    Modified Rankin (Stroke Patients Only)       Balance Overall balance assessment: Mild deficits observed, not formally tested                                           Pertinent Vitals/Pain Pain Assessment: 0-10 Pain Score: 6  Pain Location: back Pain Descriptors / Indicators: Sharp;Discomfort Pain Intervention(s): Monitored during session;Repositioned    Home Living Family/patient expects to be discharged to:: Private residence Living Arrangements: Children Available Help at Discharge: Family;Available 24 hours/day Type of Home: House Home Access: Level entry     Home Layout: One level Home Equipment: Walker - 2 wheels;Shower seat;Grab bars - tub/shower;Grab bars - toilet Additional Comments: Above information is for pt's daughter's house in [place], which is where he plans to go at discharge.  Pt lives alone here in [place].    Prior Function Level of Independence: Independent  Hand Dominance        Extremity/Trunk Assessment   Upper Extremity Assessment Upper Extremity Assessment: Defer to OT evaluation    Lower Extremity Assessment Lower Extremity Assessment: Generalized weakness    Cervical / Trunk Assessment Cervical / Trunk Assessment: Kyphotic(s/p kyphoplasty 06/09/19)  Communication    Communication: No difficulties  Cognition Arousal/Alertness: Awake/alert Behavior During Therapy: WFL for tasks assessed/performed Overall Cognitive Status: Within Functional Limits for tasks assessed                                        General Comments General comments (skin integrity, edema, etc.): Pt on 2L O2 on arrival with SpO2 93%. O2 removed for mobility. SpO2 91% at rest on RA. Desat to 85% during amb on RA. Returned to 2L with SpO2 94%.    Exercises     Assessment/Plan    PT Assessment Patient needs continued PT services  PT Problem List Decreased strength;Decreased mobility;Pain;Decreased balance;Decreased activity tolerance;Cardiopulmonary status limiting activity       PT Treatment Interventions DME instruction;Therapeutic activities;Gait training;Therapeutic exercise;Patient/family education;Balance training;Functional mobility training    PT Goals (Current goals can be found in the Care Plan section)  Acute Rehab PT Goals Patient Stated Goal: independence PT Goal Formulation: With patient Time For Goal Achievement: 07/02/19 Potential to Achieve Goals: Good    Frequency Min 3X/week   Barriers to discharge        Co-evaluation               AM-PAC PT "6 Clicks" Mobility  Outcome Measure Help needed turning from your back to your side while in a flat bed without using bedrails?: None Help needed moving from lying on your back to sitting on the side of a flat bed without using bedrails?: A Little Help needed moving to and from a bed to a chair (including a wheelchair)?: A Little Help needed standing up from a chair using your arms (e.g., wheelchair or bedside chair)?: A Little Help needed to walk in hospital room?: A Little Help needed climbing 3-5 steps with a railing? : A Lot 6 Click Score: 18    End of Session Equipment Utilized During Treatment: Gait belt;Oxygen Activity Tolerance: Patient tolerated treatment well Patient  left: in chair;with call bell/phone within reach Nurse Communication: Mobility status PT Visit Diagnosis: Pain;Difficulty in walking, not elsewhere classified (R26.2)    Time: 4128-7867 PT Time Calculation (min) (ACUTE ONLY): 21 min   Charges:   PT Evaluation $PT Eval Moderate Complexity: 1 Mod          Lorrin Goodell, PT  Office # [phone] Pager [phone]   Lorriane Shire 06/18/2019, 2:38 PM

## 2019-06-18 NOTE — Evaluation (Signed)
Occupational Therapy Evaluation Patient Details Name: [name] MRN: [id number] DOB: [birth year] Today's Date: 06/18/2019    History of Present Illness 86 y.o. male with medical history significant of COPD, myelodysplastic syndrome, recent admission for T12 kyphoplasty (06/09/19), came to emergency room for fatigue general weakness chills for 1 day.  In the emergency room was found to have a fever 102.   Clinical Impression   This 86 y/o male presents with the above. Pt with recent admit for kyphoplasty (1/22) and discharge from hospital, reports he was managing on his own at home since recent discharge. Pt tolerating room level mobility using RW with overall minguard assist; demonstrating standing grooming and LB ADL with minguard assist throughout. Reviewed back precautions during session with pt able to initially recall 2/3 precautions without cues. Session completed on 2L O2 though unable to obtain accurate O2 reading, no significant DOE noted with room level activity. Pt reports plans to discharge to daughter's home in [place]. He will benefit from continued acute OT services to maximize his safety and independence with ADL and mobility. Do not anticipate pt will require follow up OT services after discharge. Will follow.     Follow Up Recommendations  Supervision - Intermittent;No OT follow up    Equipment Recommendations  None recommended by OT           Precautions / Restrictions Precautions Precautions: Back;Fall Precaution Comments: reviewed back precautions Required Braces or Orthoses: Spinal Brace(pt currently doesn't have brace with him) Spinal Brace: Thoracolumbosacral orthotic;Applied in sitting position Restrictions Weight Bearing Restrictions: No Other Position/Activity Restrictions: Pt reports being issued TLSO after kyphoplasty but unable to tolerate wearing it. He states it makes him feel like he cannot breathe.      Mobility Bed Mobility Overal bed mobility:  Needs Assistance Bed Mobility: Supine to Sit     Supine to sit: HOB elevated;Supervision     General bed mobility comments: received OOB in recliner  Transfers Overall transfer level: Needs assistance Equipment used: Rolling walker (2 wheeled) Transfers: Sit to/from Stand Sit to Stand: Min guard         General transfer comment: min guard for safety, cues for hand placement    Balance Overall balance assessment: Mild deficits observed, not formally tested                                         ADL either performed or assessed with clinical judgement   ADL Overall ADL's : Needs assistance/impaired Eating/Feeding: Independent;Sitting   Grooming: Brushing hair;Min guard;Standing Grooming Details (indicate cue type and reason): standing at sink in room Upper Body Bathing: Set up;Supervision/ safety;Sitting   Lower Body Bathing: Min guard;Sit to/from stand   Upper Body Dressing : Set up;Supervision/safety;Sitting   Lower Body Dressing: Min guard;Sit to/from stand   Toilet Transfer: Min guard;Ambulation;RW Toilet Transfer Details (indicate cue type and reason): simulated via transfer to/from recliner, room level mobility Toileting- Clothing Manipulation and Hygiene: Min guard;Sit to/from stand       Functional mobility during ADLs: Surveyor, minerals     Praxis      Pertinent Vitals/Pain Pain Assessment: Faces Pain Score: 6  Faces Pain Scale: Hurts little more Pain Location: back, incisional Pain Descriptors / Indicators: Discomfort;Sore Pain Intervention(s): Limited activity within patient's tolerance;Monitored during session;Repositioned  Hand Dominance Right   Extremity/Trunk Assessment Upper Extremity Assessment Upper Extremity Assessment: Overall WFL for tasks assessed   Lower Extremity Assessment Lower Extremity Assessment: Defer to PT evaluation   Cervical / Trunk  Assessment Cervical / Trunk Assessment: Kyphotic(s/p kyphoplasty 06/09/19)   Communication Communication Communication: No difficulties   Cognition Arousal/Alertness: Awake/alert Behavior During Therapy: WFL for tasks assessed/performed Overall Cognitive Status: No family/caregiver present to determine baseline cognitive functioning                                 General Comments: overall WFL for basic tasks, able to recall 2/3 back precautions without assist   General Comments  use of 2L during session, difficult to obtain accurate O2 reading, pt with no significant DOE noted with room level activity    Exercises     Shoulder Instructions      Home Living Family/patient expects to be discharged to:: Private residence Living Arrangements: Children Available Help at Discharge: Family;Available 24 hours/day Type of Home: House Home Access: Level entry     Home Layout: One level     Bathroom Shower/Tub: Occupational psychologist: Handicapped height     Home Equipment: Environmental consultant - 2 wheels;Shower seat;Grab bars - tub/shower;Grab bars - toilet   Additional Comments: Above information is for pt's daughter's house in [place], which is where he plans to go at discharge.  Pt lives alone here in [place].      Prior Functioning/Environment Level of Independence: Independent        Comments: reports has been managing at home alone since recent back sx        OT Problem List: Decreased range of motion;Decreased activity tolerance;Decreased knowledge of precautions;Pain;Impaired balance (sitting and/or standing);Decreased knowledge of use of DME or AE      OT Treatment/Interventions: Self-care/ADL training;Therapeutic activities;Patient/family education;Balance training;Energy conservation;Therapeutic exercise;DME and/or AE instruction    OT Goals(Current goals can be found in the care plan section) Acute Rehab OT Goals Patient Stated Goal: independence OT  Goal Formulation: With patient Time For Goal Achievement: 07/02/19 Potential to Achieve Goals: Good  OT Frequency: Min 2X/week   Barriers to D/C:            Co-evaluation              AM-PAC OT "6 Clicks" Daily Activity     Outcome Measure Help from another person eating meals?: None Help from another person taking care of personal grooming?: A Little Help from another person toileting, which includes using toliet, bedpan, or urinal?: A Little Help from another person bathing (including washing, rinsing, drying)?: A Little Help from another person to put on and taking off regular upper body clothing?: A Little Help from another person to put on and taking off regular lower body clothing?: A Little 6 Click Score: 19   End of Session Equipment Utilized During Treatment: Gait belt;Rolling walker;Oxygen Nurse Communication: Mobility status  Activity Tolerance: Patient tolerated treatment well Patient left: in chair;with call bell/phone within reach  OT Visit Diagnosis: Other abnormalities of gait and mobility (R26.89);Pain Pain - part of body: (back)                Time: 8416-6063 OT Time Calculation (min): 22 min Charges:  OT General Charges $OT Visit: 1 Visit OT Evaluation $OT Eval Moderate Complexity: Hood, OT [employer] Pager [phone] Office [phone]  Raymondo Band 06/18/2019, 4:58 PM

## 2019-06-18 NOTE — Progress Notes (Signed)
PROGRESS NOTE    [name]  DJS:[id number] DOB: 19-Jun-1929 DOA: 06/16/2019 PCP: Sharilyn Sites, MD   Brief Narrative:  HPI per Dr. Binnie Kand on 06/16/2019 [name] is a 86 y.o. male with medical history significant of COPD, myelodysplastic syndrome, recent admission for T12 kyphoplasty discharged 4 days ago, came to emergency room for fatigue general weakness chills for 1 day.  In the emergency room was found to have a fever 102.  He has back pain secondary to surgery but denies any drainage or increased swelling or any recent injury to his back.  Denies any nausea vomiting or diarrhea.  No urinary symptoms.  Denies any blood in his stools.  Denies any hematemesis or hemoptysis.  ED Course: in the emergency room he was found to be tachypneic hypoxic, Fever102 Started on oxygen nasal cannula saturation 94% Lactic acid was 2.0, procalcitonin pending Blood cultures were done Chest x-ray negative CT chest angio no pulmonary embolus bilateral infiltrates suspicion of pneumonia Patient started on vancomycin and cefepime with suspicion of hospital-acquired pneumonia On 06/10/2019 hemoglobin 9.6 white count 2.1 On 06/16/2019 hemoglobin 10.2 white count 12.9 Covid test respiratory panel negative  **Interim History Patient was started on HCAP treatment and will continue.  Also have added guaifenesin, flutter valve and incentive spirometer along DuoNeb.  Repeat chest x-ray showed "chronic lung disease and slight worsening of the bibasilar lung density that could be atelectasis or developing pneumonia" patient feels that his shortness of breath is improving slightly.  Assessment & Plan:   Principal Problem:   HAP (hospital-acquired pneumonia) Active Problems:   GERD (gastroesophageal reflux disease)   Anemia   COPD exacerbation (HCC)   Myelodysplastic syndrome (HCC)   CKD (chronic kidney disease), stage III   Sepsis ([place])  Acute Respiratory Failure with Hypoxia Sepsis  2/2 to HCAP Hospital-Acquired pneumonia, poA -Patient was discharged recently from the hospital 06/12/2019 when he spent few days in the hospital for back pain -Recent kyphoplasty 1/22 -Came with a chief complaint shortness of breath, some productive cough, fever, and chills -Septic on Admission with a TMax of 102, Pulse of 102, and RR of 27 and a White count 12.9 -WBC On 06/10/2019 was 2.1; Now WBC is 6.3 this AM  -COVID-19 respiratory panel negative -LA was elevated and now improved to 1.4 -PCT was 0.50 and will repeat in the AM  -CT Chest Angio showed "No CT evidence of pulmonary embolism. Mild bilateral upper lobe and bilateral lower lobe atelectasis and/or infiltrate." -Repeat CXR showed "Chronic lung disease. Slight worsening of bibasilar lung density that could be atelectasis or developing pneumonia." -Lactic acid 2.0 and now it is 12.4 -Received Two 500 mL LR boluses and placed on NS at 125 mL/hr for 12 hours and have reduced rate to 75 mL/hr x1 day and will stop today  -Add Guaifenesin 1200 mg po BID, flutter valve and incentive spirometry -Obtained Blood cultures and Urine Cx -UA not revealing for UTI -Started on IV Vancomycin and Cefepime and will continue for now likely 2/2 to HCAP -ABG done and showed a pH of 7.445, PCO2 35.1, PO2 77.0, bicarbonate level 23.7, and an ABG O2 saturation of 95% -C/w Supplemental O2 via  -Also continue Albuterol 2.5 mg IH q4hprn and will start Scheduled DuoNeb q6h -Currently takes Prednisone 5 mg po Daily  -Continue supplemental oxygen via nasal cannula and wean O2 as tolerated -SpO2: 95 % O2 Flow Rate (L/min): 2 L/min -Continuous pulse oximetry and maintain O2 saturations greater than  92% -We will need ambulatory home O2 screen prior to discharge -PT/OT to Evaluate and Treat  MyeloDysplastic Syndrome History of pancytopenia -Patient with anemia and thrombocytopenia, neutropenia on last admission but here he has a leukocytosis in the setting  of his infection and continues to be anemic and thrombocytopenic -On Procrit every month last dose the day before yesterday -Patient's WBC was 12.8, Hb/Hct was 9.3/28.4, and Platelet Count was 87,000 yesterday -Today WBC showed 6.3, Hb/Hct of 7.3/22.7 and Platelet Count of 66,000 and ? Dilutional drop from IVF as above  -Sees Hematology/oncology in [place] -Continue to Monitor for S/Sx of Bleeding -Repeat CBC in AM   COPD -Not on home oxygen usually.  -Home medications include albuterol inhaler.  -He tells me he takes Spiriva as well but not listed on his home MAR -C/w DuoNeb as above and as needed Albuterol q4hprn  -Currently not wheezing so we will hold off on steroids -C/w Antibiotics for suspected bacterial pneumonia  Chronic Kidney Disease Stage IIIa.  Metabolic Acidosis, mild  -Chart review indicates he sees Nephrologist in [place].  -Creatinine baseline 1.6-2.0 range.  -Patient's BUN/Cr went from 19/1.41 -> 17/1.29 -> 96/0.45 -Had a Metabolic Acidosis with a a CO2 of 21, AG of 8, and a Chloride Level of 110 -Avoid Nephrotoxic Medications, Contrast Dyes, Hypotension and Renally Adjust Medications -Continue to Monitor and Trend Renal Function -Repeat CMP in AM   GERD -C/w PPI with Pantoprazole 40 mg po Daily   Acute on Chronic Low Back pain in the setting of T11 Compression Fracture s/p Recent Kyphoplasty 06/09/2019 -A little over a month ago patient underwent L2 vertebral augmentation per Dr. Kathyrn Sheriff and was doing well until he went to pick up a 4 foot long 2 x 4 a fewe weeks ago. He reports he felt a "pop" in his back and developed immediate back pain a few days ago when he was lifting a piece of ood at [employer].  -Evaluated by neurosurgery [facility] who opine current imaging shows chronic compression fractures and questionable acute T12 compression fracture.  -MRI Thoracic and Lumbar spine at that time showed "Truncated and motion degraded examination.  Acute or subacute compression fracture of T11 with mild edema, approximately 25% height loss and 6 mm retropulsion. No associated spinal canal stenosis.  Multiple chronic compression fractures of the thoracic and lumbar spine. Heterogeneous bone marrow signal within the lower thoracic spine and lumbar spine, compatible with reported myelodysplastic syndrome." -Underwent Recent Kyphoplasty on 06/09/2019 -Continue with oxycodone-acetaminophen 1 tab p.o. every 4 hours as needed for severe pain along with a Lidoderm patch 1 patch transdermally every 12 hours as well as cyclobenzaprine 7.5 mg p.o. 3 times daily as needed for muscle spasms as well as diazepam 5 mg p.o. every 6 hours as needed for anxiety and muscle spasms -Will add IV Fentanyl 12.5 mcg q2hprn for Severe Pain if po is ineffective  -He was given acetaminophen 1000 mg once yesterday for his fever and continues to have acetaminophen 650 mg p.o./RC every 6 hours as needed for mild pain  Hyperbilirubinemia, improved  -Patient's T Bili went from 1.5 -> 1.7 and is now improved and is 0.9 -Continue to monitor and trend and repeat CMP in a.m.  DVT prophylaxis: SCDs given his Myelodysplastic Syndrome and Anemia and Thrombocytopenia  Code Status: FULL CODE  Family Communication: Discussed with Daughter Stanton Kidney) over the telephone Disposition Plan: Patient is from home and will require PT and OT evaluation prior to safe discharge disposition no need  to clinically improve from his respiratory standpoint and be weaned off of oxygen and be afebrile for at least 24 hours prior to discharge.  He still remains on oxygen but he has been afebrile for last 24 hours.  PT OT still to evaluate  Consultants:   None   Procedures:  None   Antimicrobials:  Anti-infectives (From admission, onward)   Start     Dose/Rate Route Frequency Ordered Stop   06/17/19 2130  vancomycin (VANCOCIN) IVPB 1000 mg/200 mL premix     1,000 mg 200 mL/hr over 60 Minutes  Intravenous Every 24 hours 06/16/19 2126     06/16/19 2200  ceFEPIme (MAXIPIME) 2 g in sodium chloride 0.9 % 100 mL IVPB     2 g 200 mL/hr over 30 Minutes Intravenous Every 12 hours 06/16/19 2126     06/16/19 2130  vancomycin (VANCOREADY) IVPB 1250 mg/250 mL     1,250 mg 166.7 mL/hr over 90 Minutes Intravenous  Once 06/16/19 2126 06/17/19 0045     Subjective: Seen and examined at bedside and states that his shortness of breath was doing well but states that he is having chronic constant back pain and rated a 5 or 6 out of 10 in severity.  Feels a little bit better today but still complaining of significant back pain.  No nausea or vomiting.  No other concerns or complaints at this time but still wearing 2 L of supplemental oxygen via nasal cannula.  States that he is coughing but not coughing up very much sputum.    Objective: Vitals:   06/17/19 2010 06/18/19 0014 06/18/19 0727 06/18/19 0738  BP:  (!) 95/52  (!) 99/57  Pulse: 81 75  72  Resp: 18   18  Temp:  99.4 F (37.4 C)  (!) 97.3 F (36.3 C)  TempSrc:  Oral    SpO2: 93% 97% 96% 95%  Weight:      Height:        Intake/Output Summary (Last 24 hours) at 06/18/2019 0743 Last data filed at 06/18/2019 0600 Gross per 24 hour  Intake 1311.26 ml  Output 1000 ml  Net 311.26 ml   Filed Weights   06/16/19 2323  Weight: 62 kg   Examination: Physical Exam:  Constitutional: Frail elderly Caucasian male currently no acute distress.  Appears slightly uncomfortable complaining of back pain Eyes: Lids and conjunctivae normal, sclerae anicteric  ENMT: External Ears, Nose appear normal. Grossly normal hearing.  Neck: Appears normal, supple, no cervical masses, normal ROM, no appreciable thyromegaly; no JVD Respiratory: Diminished to auscultation bilaterally with some mild rhonchi noted., no wheezing, rales, or crackles. Normal respiratory effort and patient is not tachypenic. No accessory muscle use.  Unlabored breathing but is wearing 2 L  of supplemental oxygen via nasal cannula Cardiovascular: RRR, no murmurs / rubs / gallops. S1 and S2 auscultated.  Slight extremity edema. Abdomen: Soft, non-tender, non-distended. Bowel sounds positive.  GU: Deferred. Musculoskeletal: No clubbing / cyanosis of digits/nails. No joint deformity upper and lower extremities. Good ROM, no contractures. Normal strength and muscle tone.  Skin: No rashes, lesions, ulcers on limited skin evaluation. No induration; Warm and dry.  Neurologic: CN 2-12 grossly intact with no focal deficits. Romberg sign and cerebellar reflexes not assessed.  Psychiatric: Normal judgment and insight. Alert and oriented x 3. Normal mood and appropriate affect.   Data Reviewed: I have personally reviewed following labs and imaging studies  CBC: Recent Labs  Lab 06/16/19 1600 06/16/19 2008 06/17/19  0032 06/18/19 0407  WBC 12.9*  --  12.8* 6.3  NEUTROABS 10.2*  --  9.7* 4.8  HGB 10.2* 7.5* 9.3* 7.3*  HCT 32.7* 22.0* 28.4* 22.7*  MCV 92.6  --  90.2 89.4  PLT 97*  --  87* 66*   Basic Metabolic Panel: Recent Labs  Lab 06/16/19 1600 06/16/19 2008 06/17/19 0032 06/18/19 0407  NA 138 138 135 139  K 3.6 3.6 3.8 3.7  CL 105  --  105 110  CO2 22  --  23 21*  GLUCOSE 101*  --  100* 88  BUN 19  --  17 18  CREATININE 1.41*  --  1.29* 1.27*  CALCIUM 9.7  --  9.0 8.5*  MG 1.8  --   --  1.9  PHOS  --   --   --  2.8   GFR: Estimated Creatinine Clearance: 35.9 mL/min (A) (by C-G formula based on SCr of 1.27 mg/dL (H)). Liver Function Tests: Recent Labs  Lab 06/16/19 1600 06/17/19 0032 06/18/19 0407  AST 19 17 12*  ALT 14 12 12   ALKPHOS 99 86 67  BILITOT 1.5* 1.7* 0.9  PROT 6.6 5.7* 4.8*  ALBUMIN 3.9 3.2* 2.4*   No results for input(s): LIPASE, AMYLASE in the last 168 hours. No results for input(s): AMMONIA in the last 168 hours. Coagulation Profile: No results for input(s): INR, PROTIME in the last 168 hours. Cardiac Enzymes: No results for input(s):  CKTOTAL, CKMB, CKMBINDEX, TROPONINI in the last 168 hours. BNP (last 3 results) No results for input(s): PROBNP in the last 8760 hours. HbA1C: No results for input(s): HGBA1C in the last 72 hours. CBG: No results for input(s): GLUCAP in the last 168 hours. Lipid Profile: No results for input(s): CHOL, HDL, LDLCALC, TRIG, CHOLHDL, LDLDIRECT in the last 72 hours. Thyroid Function Tests: Recent Labs    06/16/19 1600  TSH 1.087   Anemia Panel: No results for input(s): VITAMINB12, FOLATE, FERRITIN, TIBC, IRON, RETICCTPCT in the last 72 hours. Sepsis Labs: Recent Labs  Lab 06/16/19 1600 06/16/19 1758 06/17/19 0032  PROCALCITON  --   --  0.50  LATICACIDVEN 2.0* 1.4  --     Recent Results (from the past 240 hour(s))  Surgical pcr screen     Status: None   Collection Time: 06/09/19 10:33 AM   Specimen: Nasal Mucosa; Nasal Swab  Result Value Ref Range Status   MRSA, PCR NEGATIVE NEGATIVE Final   Staphylococcus aureus NEGATIVE NEGATIVE Final    Comment: (NOTE) The Xpert SA Assay (FDA approved for NASAL specimens in patients 53 years of age and older), is one component of a comprehensive surveillance program. It is not intended to diagnose infection nor to guide or monitor treatment. Performed at [facility], [place] [address], [place], [place] [address]   Blood culture (routine x 2)     Status: None (Preliminary result)   Collection Time: 06/16/19  4:04 PM   Specimen: BLOOD RIGHT ARM  Result Value Ref Range Status   Specimen Description BLOOD RIGHT ARM  Final   Special Requests   Final    BOTTLES DRAWN AEROBIC AND ANAEROBIC Blood Culture adequate volume   Culture   Final    NO GROWTH < 24 HOURS Performed at [facility], [place] [address] [place], [place] [address]    Report Status PENDING  Incomplete  Respiratory Panel by RT PCR (Flu A&B, Covid) - Nasopharyngeal Swab     Status: None  Collection Time: 06/16/19  4:28 PM   Specimen: Nasopharyngeal Swab  Result  Value Ref Range Status   SARS Coronavirus 2 by RT PCR NEGATIVE NEGATIVE Final    Comment: (NOTE) SARS-CoV-2 target nucleic acids are NOT DETECTED. The SARS-CoV-2 RNA is generally detectable in upper respiratoy specimens during the acute phase of infection. The lowest concentration of SARS-CoV-2 viral copies this assay can detect is 131 copies/mL. A negative result does not preclude SARS-Cov-2 infection and should not be used as the sole basis for treatment or other patient management decisions. A negative result may occur with  improper specimen collection/handling, submission of specimen other than nasopharyngeal swab, presence of viral mutation(s) within the areas targeted by this assay, and inadequate number of viral copies (<131 copies/mL). A negative result must be combined with clinical observations, patient history, and epidemiological information. The expected result is Negative. Fact Sheet for Patients:  PinkCheek.be Fact Sheet for Healthcare Providers:  GravelBags.it This test is not yet ap proved or cleared by the [place] FDA and  has been authorized for detection and/or diagnosis of SARS-CoV-2 by FDA under an Emergency Use Authorization (EUA). This EUA will remain  in effect (meaning this test can be used) for the duration of the COVID-19 declaration under Section 564(b)(1) of the Act, 21 U.S.C. section 360bbb-3(b)(1), unless the authorization is terminated or revoked sooner.    Influenza A by PCR NEGATIVE NEGATIVE Final   Influenza B by PCR NEGATIVE NEGATIVE Final    Comment: (NOTE) The Xpert Xpress SARS-CoV-2/FLU/RSV assay is intended as an aid in  the diagnosis of influenza from Nasopharyngeal swab specimens and  should not be used as a sole basis for treatment. Nasal washings and  aspirates are unacceptable for Xpert Xpress SARS-CoV-2/FLU/RSV  testing. Fact Sheet for  Patients: PinkCheek.be Fact Sheet for Healthcare Providers: GravelBags.it This test is not yet approved or cleared by the [place] FDA and  has been authorized for detection and/or diagnosis of SARS-CoV-2 by  FDA under an Emergency Use Authorization (EUA). This EUA will remain  in effect (meaning this test can be used) for the duration of the  Covid-19 declaration under Section 564(b)(1) of the Act, 21  U.S.C. section 360bbb-3(b)(1), unless the authorization is  terminated or revoked. Performed at [facility], [place] [address], [place], [place] [address]   Blood culture (routine x 2)     Status: None (Preliminary result)   Collection Time: 06/16/19  5:59 PM   Specimen: BLOOD  Result Value Ref Range Status   Specimen Description BLOOD RIGHT ANTECUBITAL  Final   Special Requests   Final    BOTTLES DRAWN AEROBIC AND ANAEROBIC Blood Culture adequate volume   Culture   Final    NO GROWTH < 24 HOURS Performed at [facility], [place] [address], [place], [place] [address]    Report Status PENDING  Incomplete  Urine culture     Status: None   Collection Time: 06/16/19  6:43 PM   Specimen: Urine, Catheterized  Result Value Ref Range Status   Specimen Description URINE, CATHETERIZED  Final   Special Requests NONE  Final   Culture   Final    NO GROWTH Performed at [facility], [address] [place], [place] [address]    Report Status 06/17/2019 FINAL  Final     Radiology Studies: CT Angio Chest PE W and/or Wo Contrast  Result Date: 06/16/2019 CLINICAL DATA:  Shortness of breath. EXAM: CT ANGIOGRAPHY  CHEST WITH CONTRAST TECHNIQUE: Multidetector CT imaging of the chest was performed using the standard protocol during bolus administration of intravenous contrast. Multiplanar CT image reconstructions and MIPs were obtained to evaluate the vascular anatomy. CONTRAST:  50mL OMNIPAQUE IOHEXOL 350 MG/ML SOLN  COMPARISON:  May 19, 2018 FINDINGS: Cardiovascular: Satisfactory opacification of the pulmonary arteries to the segmental level. No evidence of pulmonary embolism. Normal heart size. No pericardial effusion. Mediastinum/Nodes: No enlarged mediastinal, hilar, or axillary lymph nodes. Thyroid gland, trachea, and esophagus demonstrate no significant findings. Lungs/Pleura: Mild, stable areas of biapical scarring and/or atelectasis are seen. Mild atelectasis and/or infiltrate is seen within the posterior aspects of the bilateral upper lobes and posterior aspects of the bilateral lung bases, right greater than left. The 11 mm lung nodule seen within the left upper lobe on the prior study is not clearly identified on the current exam. There is no evidence of a pleural effusion or pneumothorax. Upper Abdomen: Subcentimeter cysts are seen within the upper pole of the left kidney. Musculoskeletal: Multilevel degenerative changes seen throughout the thoracic spine with chronic compression fracture deformity is seen at the levels of T6 and T9. Evidence of prior vertebroplasty is noted at the level of T11. Review of the MIP images confirms the above findings. IMPRESSION: 1. No CT evidence of pulmonary embolism. 2. Mild bilateral upper lobe and bilateral lower lobe atelectasis and/or infiltrate. Electronically Signed   By: Virgina Norfolk M.D.   On: 06/16/2019 20:57   DG CHEST PORT 1 VIEW  Result Date: 06/18/2019 CLINICAL DATA:  Shortness of breath EXAM: PORTABLE CHEST 1 VIEW COMPARISON:  06/16/2019 FINDINGS: Heart size remains normal. Aortic atherosclerosis as seen previously. Chronic pulmonary scarring. Worsening patchy density at both lung bases consistent with atelectasis or basilar pneumonia. No effusion. IMPRESSION: Chronic lung disease. Slight worsening of bibasilar lung density that could be atelectasis or developing pneumonia. Electronically Signed   By: Nelson Chimes M.D.   On: 06/18/2019 06:34   DG Chest  Portable 1 View  Result Date: 06/16/2019 CLINICAL DATA:  Weakness. EXAM: PORTABLE CHEST 1 VIEW COMPARISON:  May 19, 2018 FINDINGS: The lungs are hyperinflated. Mild, chronic appearing increased interstitial lung markings are seen. There is no evidence of acute infiltrate, pleural effusion or pneumothorax. Mild, stable biapical pleural thickening is noted. The heart size and mediastinal contours are within normal limits. Multilevel degenerative changes are seen throughout the thoracic spine. Interval vertebroplasty is seen within the lower thoracic spine. IMPRESSION: No acute cardiopulmonary disease. Electronically Signed   By: Virgina Norfolk M.D.   On: 06/16/2019 16:10   Scheduled Meds: . guaiFENesin  1,200 mg Oral BID  . ipratropium-albuterol  3 mL Nebulization TID  . multivitamin with minerals  1 tablet Oral Daily  . pantoprazole  40 mg Oral Daily  . predniSONE  5 mg Oral Q breakfast  . senna  1 tablet Oral Daily  . sodium chloride flush  3 mL Intravenous Q12H   Continuous Infusions: . sodium chloride 75 mL/hr at 06/18/19 0600  . ceFEPime (MAXIPIME) IV Stopped (06/17/19 2222)  . vancomycin Stopped (06/17/19 2140)     LOS: 2 days    Kerney Elbe, DO [facility] PAGER is on [place]  If 7PM-7AM, please contact night-coverage [email or web address]

## 2019-06-19 ENCOUNTER — Inpatient Hospital Stay (HOSPITAL_COMMUNITY): Payer: Medicare Other

## 2019-06-19 LAB — CBC WITH DIFFERENTIAL/PLATELET
Abs Immature Granulocytes: 0.07 10*3/uL (ref 0.00–0.07)
Basophils Absolute: 0 10*3/uL (ref 0.0–0.1)
Basophils Relative: 1 %
Eosinophils Absolute: 0 10*3/uL (ref 0.0–0.5)
Eosinophils Relative: 0 %
HCT: 24.3 % — ABNORMAL LOW (ref 39.0–52.0)
Hemoglobin: 7.9 g/dL — ABNORMAL LOW (ref 13.0–17.0)
Immature Granulocytes: 1 %
Lymphocytes Relative: 11 %
Lymphs Abs: 0.6 10*3/uL — ABNORMAL LOW (ref 0.7–4.0)
MCH: 28.4 pg (ref 26.0–34.0)
MCHC: 32.5 g/dL (ref 30.0–36.0)
MCV: 87.4 fL (ref 80.0–100.0)
Monocytes Absolute: 0.8 10*3/uL (ref 0.1–1.0)
Monocytes Relative: 15 %
Neutro Abs: 3.7 10*3/uL (ref 1.7–7.7)
Neutrophils Relative %: 72 %
Platelets: 72 10*3/uL — ABNORMAL LOW (ref 150–400)
RBC: 2.78 MIL/uL — ABNORMAL LOW (ref 4.22–5.81)
RDW: 19.6 % — ABNORMAL HIGH (ref 11.5–15.5)
WBC: 5.2 10*3/uL (ref 4.0–10.5)
nRBC: 0 % (ref 0.0–0.2)

## 2019-06-19 LAB — COMPREHENSIVE METABOLIC PANEL
ALT: 16 U/L (ref 0–44)
AST: 15 U/L (ref 15–41)
Albumin: 2.6 g/dL — ABNORMAL LOW (ref 3.5–5.0)
Alkaline Phosphatase: 73 U/L (ref 38–126)
Anion gap: 7 (ref 5–15)
BUN: 20 mg/dL (ref 8–23)
CO2: 21 mmol/L — ABNORMAL LOW (ref 22–32)
Calcium: 8.7 mg/dL — ABNORMAL LOW (ref 8.9–10.3)
Chloride: 110 mmol/L (ref 98–111)
Creatinine, Ser: 1.42 mg/dL — ABNORMAL HIGH (ref 0.61–1.24)
GFR calc Af Amer: 51 mL/min — ABNORMAL LOW (ref >60)
GFR calc non Af Amer: 44 mL/min — ABNORMAL LOW (ref >60)
Glucose, Bld: 91 mg/dL (ref 70–99)
Potassium: 3.8 mmol/L (ref 3.5–5.1)
Sodium: 138 mmol/L (ref 135–145)
Total Bilirubin: 1 mg/dL (ref 0.3–1.2)
Total Protein: 5.3 g/dL — ABNORMAL LOW (ref 6.5–8.1)

## 2019-06-19 LAB — MRSA PCR SCREENING: MRSA by PCR: NEGATIVE

## 2019-06-19 LAB — PHOSPHORUS: Phosphorus: 2.3 mg/dL — ABNORMAL LOW (ref 2.5–4.6)

## 2019-06-19 LAB — MAGNESIUM: Magnesium: 1.9 mg/dL (ref 1.7–2.4)

## 2019-06-19 MED ORDER — ENSURE ENLIVE PO LIQD
237.0000 mL | Freq: Two times a day (BID) | ORAL | Status: DC
  Administered 2019-06-19 – 2019-06-21 (×4): 237 mL via ORAL

## 2019-06-19 MED ORDER — POTASSIUM PHOSPHATES 15 MMOLE/5ML IV SOLN
10.0000 mmol | Freq: Once | INTRAVENOUS | Status: AC
  Administered 2019-06-19: 10 mmol via INTRAVENOUS
  Filled 2019-06-19: qty 3.33

## 2019-06-19 NOTE — Progress Notes (Signed)
Initial Nutrition Assessment  DOCUMENTATION CODES:   Underweight  INTERVENTION:   -Ensure Enlive po BID, each supplement provides 350 kcal and 20 grams of protein -Multivitamin with minerals daily -Magic cup BID with meals, each supplement provides 290 kcal and 9 grams of protein  NUTRITION DIAGNOSIS:   Increased nutrient needs related to acute illness(HAP) as evidenced by estimated needs  GOAL:   Patient will meet greater than or equal to 90% of their needs  MONITOR:   PO intake, Supplement acceptance, Labs, Weight trends, I & O's  REASON FOR ASSESSMENT:   Consult Assessment of nutrition requirement/status  ASSESSMENT:   86 y.o. male with medical history significant of COPD, myelodysplastic syndrome, recent admission for T12 kyphoplasty discharged 4 days ago, came to emergency room for fatigue general weakness chills for 1 day. Admitted for HAP.  **RD working remotely.**  Pt just recently discharged from [facility] on 1/25 following back surgery.  During that admission pt was consuming ~80-100% of meals. Pt reports decreased intakes since discharge. Pt developed fever and now with HAP.  Will order Ensure and Magic Cup supplements to aid in acute illness and healing from recent surgery.   Per weight records, pt has lost 4 lbs since October 2020. Pt has been underweight for over a year now.  I/Os: -825 ml since admit UOP: 1050 ml x 24 hrs  Medications: Multivitamin with minerals daily, K-Phos  Labs reviewed:  Low Phos Mg WNL  NUTRITION - FOCUSED PHYSICAL EXAM:  Working remotely.   Diet Order:   Diet Order            Diet regular Room service appropriate? Yes; Fluid consistency: Thin  Diet effective now              EDUCATION NEEDS:   No education needs have been identified at this time  Skin:  Skin Assessment: Reviewed RN Assessment  Last BM:  1/31  Height:   Ht Readings from Last 1 Encounters:  06/16/19 6\' 4"  (1.93 m)    Weight:   Wt Readings  from Last 1 Encounters:  06/16/19 62 kg    Ideal Body Weight:  91.8 kg  BMI:  Body mass index is 16.64 kg/m.  Estimated Nutritional Needs:   Kcal:  2200-2400  Protein:  100-115g  Fluid:  2.2L/day  Clayton Bibles, MS, RD, LDN Inpatient Clinical Dietitian Pager: [phone] After Hours Pager: [phone]

## 2019-06-19 NOTE — Progress Notes (Signed)
PROGRESS NOTE    MY MADARIAGA  WNI:[id number] DOB: 03-08-1930 DOA: 06/16/2019 PCP: Sharilyn Sites, MD   Brief Narrative:  HPI per Dr. Binnie Kand on 06/16/2019 [name] is a 86 y.o. male with medical history significant of COPD, myelodysplastic syndrome, recent admission for T12 kyphoplasty discharged 4 days ago, came to emergency room for fatigue general weakness chills for 1 day.  In the emergency room was found to have a fever 102.  He has back pain secondary to surgery but denies any drainage or increased swelling or any recent injury to his back.  Denies any nausea vomiting or diarrhea.  No urinary symptoms.  Denies any blood in his stools.  Denies any hematemesis or hemoptysis.  ED Course: in the emergency room he was found to be tachypneic hypoxic, Fever102 Started on oxygen nasal cannula saturation 94% Lactic acid was 2.0, procalcitonin pending Blood cultures were done Chest x-ray negative CT chest angio no pulmonary embolus bilateral infiltrates suspicion of pneumonia Patient started on vancomycin and cefepime with suspicion of hospital-acquired pneumonia On 06/10/2019 hemoglobin 9.6 white count 2.1 On 06/16/2019 hemoglobin 10.2 white count 12.9 Covid test respiratory panel negative  **Interim History Patient was started on HCAP treatment and will continue and likely de-escalate Abx today .  Also have added guaifenesin, flutter valve and incentive spirometer along DuoNeb.  Repeat chest x-ray yesterday showed "chronic lung disease and slight worsening of the bibasilar lung density that could be atelectasis or developing pneumonia" but patient feels that his shortness of breath is improving slightly. PT/OT recommending no Follow Up will need to trying to wean oxygen  Assessment & Plan:   Principal Problem:   HAP (hospital-acquired pneumonia) Active Problems:   GERD (gastroesophageal reflux disease)   Anemia   COPD exacerbation (HCC)   Myelodysplastic syndrome  (HCC)   CKD (chronic kidney disease), stage III   Sepsis ([place])  Acute Respiratory Failure with Hypoxia Sepsis 2/2 to HCAP Hospital-Acquired pneumonia, poA -Patient was discharged recently from the hospital 06/12/2019 when he spent few days in the hospital for back pain -Recent kyphoplasty 1/22 -Came with a chief complaint shortness of breath, some productive cough, fever, and chills -Septic on Admission with a TMax of 102, Pulse of 102, and RR of 27 and a White count 12.9 -WBC On 06/10/2019 was 2.1; Now WBC is 5.2 this AM  -COVID-19 respiratory panel negative -LA was elevated was 2.0 and now improved to 1.4 -PCT was 0.50 and was not repeated this AM  -CT Chest Angio showed "No CT evidence of pulmonary embolism. Mild bilateral upper lobe and bilateral lower lobe atelectasis and/or infiltrate." -Repeat CXR yesterday showed "Chronic lung disease. Slight worsening of bibasilar lung density that could be atelectasis or developing pneumonia."; Repeat CXR this AM showed: " -IVF Stopped  -Add Guaifenesin 1200 mg po BID, flutter valve and incentive spirometry -Obtained Blood cultures and Urine Cx; Blood Cx x2 showed NGTD at 2 Days  -UA not revealing for UTI -Started on IV Vancomycin and Cefepime and will continue for now likely 2/2 to HCAP -ABG done and showed a pH of 7.445, PCO2 35.1, PO2 77.0, bicarbonate level 23.7, and an ABG O2 saturation of 95% -Also continue Albuterol 2.5 mg IH q4hprn and will start Scheduled DuoNeb q6h -Currently takes Prednisone 5 mg po Daily  -Continue supplemental oxygen via nasal cannula and wean O2 as tolerated -SpO2: 97 % O2 Flow Rate (L/min): 2 L/min; Attempt to wean and OOB to Chair  -Continuous pulse oximetry  and maintain O2 saturations greater than 92% -We will need ambulatory home O2 screen prior to discharge -PT/OT to Evaluate and Treat and recommending Intermittent Supervision but no follow up   MyeloDysplastic Syndrome History of pancytopenia -Patient  with anemia and thrombocytopenia, neutropenia on last admission but here he has a leukocytosis in the setting of his infection and continues to be anemic and thrombocytopenic -On Procrit every month last dose a few days ago  -Today WBC showed 5.2, Hb/Hct of 7.9/24.3 and Platelet Count of 72,000 and ? Dilutional drop from IVF as above  -Sees Hematology/oncology in [place] -Continue to Monitor for S/Sx of Bleeding -Repeat CBC in AM   COPD -Not on home oxygen usually.  -Home medications include albuterol inhaler.  -He tells me he takes Spiriva as well but not listed on his home MAR -C/w DuoNeb as above and as needed Albuterol q4hprn  -Currently not wheezing so we will hold off on steroids -C/w Antibiotics for suspected bacterial pneumonia and check MRSA PCR  Chronic Kidney Disease Stage IIIa.  Metabolic Acidosis, mild  -Chart review indicates he sees Nephrologist in [place].  -Creatinine baseline 1.6-2.0 range.  -Patient's BUN/Cr went from 19/1.41 -> 17/1.29 -> 18/1.27 -> 20/1.42 -Continues to have a mild Metabolic Acidosis with a a CO2 of 21, AG of 7, and a Chloride Level of 110 -Avoid Nephrotoxic Medications, Contrast Dyes, Hypotension and Renally Adjust Medications -Continue to Monitor and Trend Renal Function -Repeat CMP in AM   GERD -C/w PPI with Pantoprazole 40 mg po Daily   Acute on Chronic Low Back pain in the setting of T11 Compression Fracture s/p Recent Kyphoplasty 06/09/2019 -A little over a month ago patient underwent L2 vertebral augmentation per Dr. Kathyrn Sheriff and was doing well until he went to pick up a 4 foot long 2 x 4 a fewe weeks ago. He reports he felt a "pop" in his back and developed immediate back pain a few days ago when he was lifting a piece of ood at [employer].  -Evaluated by neurosurgery last [facility] who opine current imaging shows chronic compression fractures and questionable acute T12 compression fracture.  -MRI Thoracic and Lumbar spine  at that time showed "Truncated and motion degraded examination. Acute or subacute compression fracture of T11 with mild edema, approximately 25% height loss and 6 mm retropulsion. No associated spinal canal stenosis.  Multiple chronic compression fractures of the thoracic and lumbar spine. Heterogeneous bone marrow signal within the lower thoracic spine and lumbar spine, compatible with reported myelodysplastic syndrome." -Underwent Recent Kyphoplasty on 06/09/2019 -Continue with oxycodone-acetaminophen 1 tab p.o. every 4 hours as needed for severe pain along with a Lidoderm patch 1 patch transdermally every 12 hours as well as cyclobenzaprine 7.5 mg p.o. 3 times daily as needed for muscle spasms as well as diazepam 5 mg p.o. every 6 hours as needed for anxiety and muscle spasms -Added IV Fentanyl 12.5 mcg q2hprn for Severe Pain if po is ineffective; patient states that his fentanyl has working well for him.  He may need a fentanyl patch -He was given acetaminophen 1000 mg once yesterday for his fever and continues to have acetaminophen 650 mg p.o./RC every 6 hours as needed for mild pain  Hyperbilirubinemia, improved  -Patient's T Bili went from 1.5 -> 1.7 and is now improved and is 1.0 -Continue to monitor and trend and repeat CMP in a.m.  Hypophosphatemia -Patient's Phos level was 2.3 -Replete with IV KPhos 10 mmol -Continue to Monitor and Replete as Necessary -  Repeat Phos Level in AM   DVT prophylaxis: SCDs given his Myelodysplastic Syndrome and Anemia and Thrombocytopenia  Code Status: FULL CODE  Family Communication: Discussed with Daughter Stanton Kidney) over the telephone yesterday Disposition Plan: Patient is from home and will require PT and OT evaluation prior to safe discharge disposition no need to clinically improve from his respiratory standpoint and be weaned off of oxygen and be afebrile for at least 24 hours prior to discharge.  He still remains on oxygen but he has been afebrile for  last 24 hours.  PT OT recommending supervision/ intermittent and no PT/OT Follow up  Consultants:   None   Procedures:  None   Antimicrobials:  Anti-infectives (From admission, onward)   Start     Dose/Rate Route Frequency Ordered Stop   06/17/19 2130  vancomycin (VANCOCIN) IVPB 1000 mg/200 mL premix     1,000 mg 200 mL/hr over 60 Minutes Intravenous Every 24 hours 06/16/19 2126     06/16/19 2200  ceFEPIme (MAXIPIME) 2 g in sodium chloride 0.9 % 100 mL IVPB     2 g 200 mL/hr over 30 Minutes Intravenous Every 12 hours 06/16/19 2126     06/16/19 2130  vancomycin (VANCOREADY) IVPB 1250 mg/250 mL     1,250 mg 166.7 mL/hr over 90 Minutes Intravenous  Once 06/16/19 2126 06/17/19 0045     Subjective: Seen and examined at bedside and he thinks his shortness of breath is doing fairly well.  Still complains of back pain.  No nausea or vomiting.  Denies any lightheadedness or dizziness.  No other concerns or complaints at this time.  Objective: Vitals:   06/18/19 1702 06/18/19 2229 06/19/19 0734 06/19/19 0734  BP: 121/69 117/70 127/75 127/75  Pulse: 76 73 71 72  Resp: 18 18 16 16   Temp: (!) 97.5 F (36.4 C) 98.3 F (36.8 C)    TempSrc:  Oral    SpO2: 92% 96% 97% 97%  Weight:      Height:        Intake/Output Summary (Last 24 hours) at 06/19/2019 3151 Last data filed at 06/19/2019 0500 Gross per 24 hour  Intake 3 ml  Output 950 ml  Net -947 ml   Filed Weights   06/16/19 2323  Weight: 62 kg   Examination: Physical Exam:  Constitutional: Patient is a frail elderly Caucasian male currently no acute distress appears somewhat uncomfortable still complained of back pain but states that shortness breath is improved Eyes: Lids and conjunctivae normal, sclerae anicteric  ENMT: External Ears, Nose appear normal. Grossly normal hearing. Mucous membranes are moist. Neck: Appears normal, supple, no cervical masses, normal ROM, no appreciable thyromegaly; no JVD Respiratory: Slightly  diminished to auscultation bilaterally with slight rhonchi.  No appreciable wheezing, rales or crackles.  He has unlabored breathing but is wearing 2 L of supplemental oxygen via nasal cannula.  Cardiovascular: RRR, no murmurs / rubs / gallops. S1 and S2 auscultated.  Very trace extremity edema Abdomen: Soft, non-tender, non-distended. Bowel sounds positive x4.  GU: Deferred. Musculoskeletal: No clubbing / cyanosis of digits/nails. No joint deformity upper and lower extremities. Skin: No rashes, lesions, ulcers on a limited skin evaluation. No induration; Warm and dry.  Neurologic: CN 2-12 grossly intact with no focal deficits. Romberg sign and cerebellar reflexes not assessed.  Psychiatric: Normal judgment and insight. Alert and oriented x 3. Normal mood and appropriate affect.   Data Reviewed: I have personally reviewed following labs and imaging studies  CBC: Recent Labs  Lab 06/16/19 1600 06/16/19 2008 06/17/19 0032 06/18/19 0407 06/19/19 0236  WBC 12.9*  --  12.8* 6.3 5.2  NEUTROABS 10.2*  --  9.7* 4.8 3.7  HGB 10.2* 7.5* 9.3* 7.3* 7.9*  HCT 32.7* 22.0* 28.4* 22.7* 24.3*  MCV 92.6  --  90.2 89.4 87.4  PLT 97*  --  87* 66* 72*   Basic Metabolic Panel: Recent Labs  Lab 06/16/19 1600 06/16/19 2008 06/17/19 0032 06/18/19 0407 06/19/19 0236  NA 138 138 135 139 138  K 3.6 3.6 3.8 3.7 3.8  CL 105  --  105 110 110  CO2 22  --  23 21* 21*  GLUCOSE 101*  --  100* 88 91  BUN 19  --  17 18 20   CREATININE 1.41*  --  1.29* 1.27* 1.42*  CALCIUM 9.7  --  9.0 8.5* 8.7*  MG 1.8  --   --  1.9 1.9  PHOS  --   --   --  2.8 2.3*   GFR: Estimated Creatinine Clearance: 32.1 mL/min (A) (by C-G formula based on SCr of 1.42 mg/dL (H)). Liver Function Tests: Recent Labs  Lab 06/16/19 1600 06/17/19 0032 06/18/19 0407 06/19/19 0236  AST 19 17 12* 15  ALT 14 12 12 16   ALKPHOS 99 86 67 73  BILITOT 1.5* 1.7* 0.9 1.0  PROT 6.6 5.7* 4.8* 5.3*  ALBUMIN 3.9 3.2* 2.4* 2.6*   No results  for input(s): LIPASE, AMYLASE in the last 168 hours. No results for input(s): AMMONIA in the last 168 hours. Coagulation Profile: No results for input(s): INR, PROTIME in the last 168 hours. Cardiac Enzymes: No results for input(s): CKTOTAL, CKMB, CKMBINDEX, TROPONINI in the last 168 hours. BNP (last 3 results) No results for input(s): PROBNP in the last 8760 hours. HbA1C: No results for input(s): HGBA1C in the last 72 hours. CBG: No results for input(s): GLUCAP in the last 168 hours. Lipid Profile: No results for input(s): CHOL, HDL, LDLCALC, TRIG, CHOLHDL, LDLDIRECT in the last 72 hours. Thyroid Function Tests: Recent Labs    06/16/19 1600  TSH 1.087   Anemia Panel: No results for input(s): VITAMINB12, FOLATE, FERRITIN, TIBC, IRON, RETICCTPCT in the last 72 hours. Sepsis Labs: Recent Labs  Lab 06/16/19 1600 06/16/19 1758 06/17/19 0032  PROCALCITON  --   --  0.50  LATICACIDVEN 2.0* 1.4  --     Recent Results (from the past 240 hour(s))  Surgical pcr screen     Status: None   Collection Time: 06/09/19 10:33 AM   Specimen: Nasal Mucosa; Nasal Swab  Result Value Ref Range Status   MRSA, PCR NEGATIVE NEGATIVE Final   Staphylococcus aureus NEGATIVE NEGATIVE Final    Comment: (NOTE) The Xpert SA Assay (FDA approved for NASAL specimens in patients 77 years of age and older), is one component of a comprehensive surveillance program. It is not intended to diagnose infection nor to guide or monitor treatment. Performed at [facility], [place] [address], [place], [place] [address]   Blood culture (routine x 2)     Status: None (Preliminary result)   Collection Time: 06/16/19  4:04 PM   Specimen: BLOOD RIGHT ARM  Result Value Ref Range Status   Specimen Description BLOOD RIGHT ARM  Final   Special Requests   Final    BOTTLES DRAWN AEROBIC AND ANAEROBIC Blood Culture adequate volume   Culture   Final    NO GROWTH 2 DAYS Performed at [facility], [address], [place], [place] [address]    Report Status PENDING  Incomplete  Respiratory Panel by RT PCR (Flu A&B, Covid) - Nasopharyngeal Swab     Status: None   Collection Time: 06/16/19  4:28 PM   Specimen: Nasopharyngeal Swab  Result Value Ref Range Status   SARS Coronavirus 2 by RT PCR NEGATIVE NEGATIVE Final    Comment: (NOTE) SARS-CoV-2 target nucleic acids are NOT DETECTED. The SARS-CoV-2 RNA is generally detectable in upper respiratoy specimens during the acute phase of infection. The lowest concentration of SARS-CoV-2 viral copies this assay can detect is 131 copies/mL. A negative result does not preclude SARS-Cov-2 infection and should not be used as the sole basis for treatment or other patient management decisions. A negative result may occur with  improper specimen collection/handling, submission of specimen other than nasopharyngeal swab, presence of viral mutation(s) within the areas targeted by this assay, and inadequate number of viral copies (<131 copies/mL). A negative result must be combined with clinical observations, patient history, and epidemiological information. The expected result is Negative. Fact Sheet for Patients:  PinkCheek.be Fact Sheet for Healthcare Providers:  GravelBags.it This test is not yet ap proved or cleared by the [place] FDA and  has been authorized for detection and/or diagnosis of SARS-CoV-2 by FDA under an Emergency Use Authorization (EUA). This EUA will remain  in effect (meaning this test can be used) for the duration of the COVID-19 declaration under Section 564(b)(1) of the Act, 21 U.S.C. section 360bbb-3(b)(1), unless the authorization is terminated or revoked sooner.    Influenza A by PCR NEGATIVE NEGATIVE Final   Influenza B by PCR NEGATIVE NEGATIVE Final    Comment: (NOTE) The Xpert Xpress SARS-CoV-2/FLU/RSV assay is intended as an aid in  the diagnosis of influenza from  Nasopharyngeal swab specimens and  should not be used as a sole basis for treatment. Nasal washings and  aspirates are unacceptable for Xpert Xpress SARS-CoV-2/FLU/RSV  testing. Fact Sheet for Patients: PinkCheek.be Fact Sheet for Healthcare Providers: GravelBags.it This test is not yet approved or cleared by the [place] FDA and  has been authorized for detection and/or diagnosis of SARS-CoV-2 by  FDA under an Emergency Use Authorization (EUA). This EUA will remain  in effect (meaning this test can be used) for the duration of the  Covid-19 declaration under Section 564(b)(1) of the Act, 21  U.S.C. section 360bbb-3(b)(1), unless the authorization is  terminated or revoked. Performed at [facility], [place] [address], [place], [place] [address]   Blood culture (routine x 2)     Status: None (Preliminary result)   Collection Time: 06/16/19  5:59 PM   Specimen: BLOOD  Result Value Ref Range Status   Specimen Description BLOOD RIGHT ANTECUBITAL  Final   Special Requests   Final    BOTTLES DRAWN AEROBIC AND ANAEROBIC Blood Culture adequate volume   Culture   Final    NO GROWTH 2 DAYS Performed at [facility], [place] [address] [place], [place] [address]    Report Status PENDING  Incomplete  Urine culture     Status: None   Collection Time: 06/16/19  6:43 PM   Specimen: Urine, Catheterized  Result Value Ref Range Status   Specimen Description URINE, CATHETERIZED  Final   Special Requests NONE  Final   Culture   Final    NO GROWTH Performed at [facility], [address] [place], [place] [address]  Report Status 06/17/2019 FINAL  Final     Radiology Studies: DG CHEST PORT 1 VIEW  Result Date: 06/18/2019 CLINICAL DATA:  Shortness of breath EXAM: PORTABLE CHEST 1 VIEW COMPARISON:  06/16/2019 FINDINGS: Heart size remains normal. Aortic atherosclerosis as seen previously. Chronic pulmonary scarring.  Worsening patchy density at both lung bases consistent with atelectasis or basilar pneumonia. No effusion. IMPRESSION: Chronic lung disease. Slight worsening of bibasilar lung density that could be atelectasis or developing pneumonia. Electronically Signed   By: Nelson Chimes M.D.   On: 06/18/2019 06:34   Scheduled Meds: . guaiFENesin  1,200 mg Oral BID  . multivitamin with minerals  1 tablet Oral Daily  . pantoprazole  40 mg Oral Daily  . predniSONE  5 mg Oral Q breakfast  . senna  1 tablet Oral Daily  . sodium chloride flush  3 mL Intravenous Q12H   Continuous Infusions: . ceFEPime (MAXIPIME) IV 2 g (06/18/19 2201)  . potassium PHOSPHATE IVPB (in mmol)    . vancomycin 1,000 mg (06/18/19 2040)     LOS: 3 days    Kerney Elbe, DO [facility] PAGER is on [place]  If 7PM-7AM, please contact night-coverage [email or web address]

## 2019-06-19 NOTE — Patient Outreach (Addendum)
[place] [facility]) Care Management  06/16/2019  [place] May 20, 1929 [id number]     EMMI call planned however pt in the ED at this time.   PLAN: Will continue to follow.  Raina Mina, RN Care Management Coordinator [place] Office [phone]

## 2019-06-20 ENCOUNTER — Inpatient Hospital Stay (HOSPITAL_COMMUNITY): Payer: Medicare Other

## 2019-06-20 ENCOUNTER — Encounter: Payer: Self-pay | Admitting: *Deleted

## 2019-06-20 LAB — CBC WITH DIFFERENTIAL/PLATELET
Abs Immature Granulocytes: 0.09 10*3/uL — ABNORMAL HIGH (ref 0.00–0.07)
Basophils Absolute: 0 10*3/uL (ref 0.0–0.1)
Basophils Relative: 1 %
Eosinophils Absolute: 0 10*3/uL (ref 0.0–0.5)
Eosinophils Relative: 0 %
HCT: 24.9 % — ABNORMAL LOW (ref 39.0–52.0)
Hemoglobin: 8.1 g/dL — ABNORMAL LOW (ref 13.0–17.0)
Immature Granulocytes: 2 %
Lymphocytes Relative: 13 %
Lymphs Abs: 0.6 10*3/uL — ABNORMAL LOW (ref 0.7–4.0)
MCH: 29.2 pg (ref 26.0–34.0)
MCHC: 32.5 g/dL (ref 30.0–36.0)
MCV: 89.9 fL (ref 80.0–100.0)
Monocytes Absolute: 0.5 10*3/uL (ref 0.1–1.0)
Monocytes Relative: 12 %
Neutro Abs: 3 10*3/uL (ref 1.7–7.7)
Neutrophils Relative %: 72 %
Platelets: 70 10*3/uL — ABNORMAL LOW (ref 150–400)
RBC: 2.77 MIL/uL — ABNORMAL LOW (ref 4.22–5.81)
RDW: 19.5 % — ABNORMAL HIGH (ref 11.5–15.5)
WBC: 4.3 10*3/uL (ref 4.0–10.5)
nRBC: 0 % (ref 0.0–0.2)

## 2019-06-20 LAB — PROCALCITONIN: Procalcitonin: 1.02 ng/mL

## 2019-06-20 LAB — COMPREHENSIVE METABOLIC PANEL
ALT: 15 U/L (ref 0–44)
AST: 14 U/L — ABNORMAL LOW (ref 15–41)
Albumin: 2.8 g/dL — ABNORMAL LOW (ref 3.5–5.0)
Alkaline Phosphatase: 75 U/L (ref 38–126)
Anion gap: 9 (ref 5–15)
BUN: 20 mg/dL (ref 8–23)
CO2: 21 mmol/L — ABNORMAL LOW (ref 22–32)
Calcium: 9 mg/dL (ref 8.9–10.3)
Chloride: 107 mmol/L (ref 98–111)
Creatinine, Ser: 1.18 mg/dL (ref 0.61–1.24)
GFR calc Af Amer: >60 mL/min (ref >60)
GFR calc non Af Amer: 55 mL/min — ABNORMAL LOW (ref >60)
Glucose, Bld: 99 mg/dL (ref 70–99)
Potassium: 4 mmol/L (ref 3.5–5.1)
Sodium: 137 mmol/L (ref 135–145)
Total Bilirubin: 1 mg/dL (ref 0.3–1.2)
Total Protein: 5.5 g/dL — ABNORMAL LOW (ref 6.5–8.1)

## 2019-06-20 LAB — PHOSPHORUS: Phosphorus: 2.6 mg/dL (ref 2.5–4.6)

## 2019-06-20 LAB — MAGNESIUM: Magnesium: 1.9 mg/dL (ref 1.7–2.4)

## 2019-06-20 MED ORDER — FENTANYL 12 MCG/HR TD PT72
1.0000 | MEDICATED_PATCH | TRANSDERMAL | Status: DC
  Administered 2019-06-20: 1 via TRANSDERMAL
  Filled 2019-06-20: qty 1

## 2019-06-20 MED ORDER — SENNOSIDES-DOCUSATE SODIUM 8.6-50 MG PO TABS
1.0000 | ORAL_TABLET | Freq: Two times a day (BID) | ORAL | Status: DC
  Administered 2019-06-20 – 2019-06-21 (×3): 1 via ORAL
  Filled 2019-06-20 (×3): qty 1

## 2019-06-20 MED ORDER — FUROSEMIDE 10 MG/ML IJ SOLN
40.0000 mg | Freq: Once | INTRAMUSCULAR | Status: AC
  Administered 2019-06-20: 40 mg via INTRAVENOUS
  Filled 2019-06-20: qty 4

## 2019-06-20 MED ORDER — OXYCODONE-ACETAMINOPHEN 5-325 MG PO TABS
1.0000 | ORAL_TABLET | Freq: Four times a day (QID) | ORAL | Status: DC | PRN
  Administered 2019-06-20 – 2019-06-21 (×3): 1 via ORAL
  Filled 2019-06-20 (×3): qty 1

## 2019-06-20 MED ORDER — BISACODYL 10 MG RE SUPP: 10.0000 mg | Freq: Every day | RECTAL | Status: DC | PRN

## 2019-06-20 MED ORDER — FENTANYL CITRATE (PF) 100 MCG/2ML IJ SOLN
12.5000 ug | INTRAMUSCULAR | Status: DC | PRN
  Administered 2019-06-20: 12.5 ug via INTRAVENOUS
  Filled 2019-06-20: qty 2

## 2019-06-20 MED ORDER — POLYETHYLENE GLYCOL 3350 17 G PO PACK
17.0000 g | PACK | Freq: Two times a day (BID) | ORAL | Status: DC
  Administered 2019-06-20 (×2): 17 g via ORAL
  Filled 2019-06-20 (×3): qty 1

## 2019-06-20 NOTE — Progress Notes (Signed)
Pt tolerating being on RA at rest. He occasionally needs O2 when ambulating longer distances. Pt's pain level inhibited him from completing a pulmonary ambulatory test post working with PT and OT.  OT noted that O2 went below 90% during their walk, but was not sure about accuracy of her pulse ox. Will review with night shift RN that test may need to be repeated in AM.   RN will continue to monitor pt.

## 2019-06-20 NOTE — Telephone Encounter (Signed)
This encounter was created in error - please disregard.

## 2019-06-20 NOTE — Care Management (Addendum)
CM unable to reach pt via phone.  CM spoke with pts daughter [name].  [name] plans to take pt back to her home in [place] after pts vaccine shot scheduled on 06/22/19.  CM helped [name] locate [name] HH list on google - [name] will call pts primary care provider and request out of state [place] arrangements be made by pt PCP with her/pt choice of agency.  Per previous CM note during last stay in Jan - pt was provided written [name] order.   Pt will transport home via private vehicle by family friend and pts daughter will drive down to [name] on day of discharge

## 2019-06-20 NOTE — Progress Notes (Signed)
Physical Therapy Treatment Patient Details Name: [name] MRN: [id number] DOB: May 02, 1930 Today's Date: 06/20/2019    History of Present Illness Pt is an 86 y.o. male with PMH of COPD, myelodysplastic syndrome, recent admission for T12 kyphoplasty (06/09/19), now admitted 06/16/19 with weakness and fever. Worked up for sepsis and hypoxic respiratory failure secondary to PNA.   PT Comments    Pt progressing well with mobility. Ambulatory with RW at supervision-level, limited by generalized weakness and pain (abdomen/constipation > back pain; RN aware). SpO2 95-100% on RA. Pt declining gait training without use of DME. Plans to d/c to daughter's home and continue HHPT services. Will continue to follow acutely.   Follow Up Recommendations  Home health PT;Supervision - Intermittent     Equipment Recommendations  None recommended by PT    Recommendations for Other Services       Precautions / Restrictions Precautions Precautions: Back;Fall Precaution Comments: Back precautions s/p recent kyphoplasty; pt does not have brace at hospital    Mobility  Bed Mobility               General bed mobility comments: Received in bathroom  Transfers   Equipment used: Rolling walker (2 wheeled) Transfers: Sit to/from Stand Sit to Stand: Modified independent (Device/Increase time)            Ambulation/Gait Ambulation/Gait assistance: Supervision Gait Distance (Feet): 220 Feet Assistive device: Rolling walker (2 wheeled) Gait Pattern/deviations: Step-through pattern;Decreased stride length;Trunk flexed   Gait velocity interpretation: 1.31 - 2.62 ft/sec, indicative of limited community ambulator General Gait Details: Pt requesting use of RW. Steady gait with RW at supervision-level; 1x cue to maintain closer proximity to [employer]    Modified Rankin (Stroke Patients Only)       Balance Overall balance assessment: Needs  assistance   Sitting balance-Leahy Scale: Good       Standing balance-Leahy Scale: Fair Standing balance comment: Can static stand and take steps without UE support; stability and comfort improved with UE support on RW                            Cognition Arousal/Alertness: Awake/alert Behavior During Therapy: WFL for tasks assessed/performed Overall Cognitive Status: Within Functional Limits for tasks assessed                                 General Comments: WFL for simple tasks; not formally assessed. Internally distracted by constipation and pain; following commands and interacting appropriately      Exercises      General Comments General comments (skin integrity, edema, etc.): SpO2 95-100% on RA; DOE 2-3/4 with mobility      Pertinent Vitals/Pain Pain Assessment: Faces Faces Pain Scale: Hurts a little bit Pain Location: Stomach/constipation > back Pain Descriptors / Indicators: Discomfort;Sore Pain Intervention(s): Monitored during session;Other (comment)(requested constipation relief from RN)    Home Living                      Prior Function            PT Goals (current goals can now be found in the care plan section) Progress towards PT goals: Progressing toward goals    Frequency    Min 3X/week      PT Plan  Current plan remains appropriate    Co-evaluation              AM-PAC PT "6 Clicks" Mobility   Outcome Measure  Help needed turning from your back to your side while in a flat bed without using bedrails?: None Help needed moving from lying on your back to sitting on the side of a flat bed without using bedrails?: None Help needed moving to and from a bed to a chair (including a wheelchair)?: None Help needed standing up from a chair using your arms (e.g., wheelchair or bedside chair)?: None Help needed to walk in hospital room?: A Little Help needed climbing 3-5 steps with a railing? : A Little 6 Click  Score: 22    End of Session   Activity Tolerance: Patient tolerated treatment well Patient left: in chair;with call bell/phone within reach;with chair alarm set Nurse Communication: Mobility status PT Visit Diagnosis: Pain;Difficulty in walking, not elsewhere classified (R26.2)     Time: 0254-8628 PT Time Calculation (min) (ACUTE ONLY): 20 min  Charges:  $Therapeutic Exercise: 8-22 mins                    Mabeline Caras, PT, DPT Acute Rehabilitation Services  Pager [phone] Office [place] 06/20/2019, 12:04 PM

## 2019-06-20 NOTE — Progress Notes (Signed)
Occupational Therapy Treatment Patient Details Name: [name] MRN: [id number] DOB: Oct 16, 1929 Today's Date: 06/20/2019    History of present illness Pt is an 86 y.o. male with PMH of COPD, myelodysplastic syndrome, recent admission for T12 kyphoplasty (06/09/19), now admitted 06/16/19 with weakness and fever. Worked up for sepsis and hypoxic respiratory failure secondary to PNA.   OT comments  Pt making steady progress towards OT goals this session. Upon OT arrival, pt on RA with O2 88%. Donned O2 at 2L with O2 sats able to rebound to 100%. Pt completed household distance functional mobility from recliner<>bathroom while pushing IV pole with min guard assist. Overall, pt requires minguard- supervision for all toileting tasks and standing grooming tasks able to maintain back precautions throughout. Pt able to maintain O2 sats >90% on RA during x2 laps in room. Left pt on RA with RN aware. Pt likely to DC to daughters house tomorrow reporting that he has all needed DME. Will continue to follow acutely.    Follow Up Recommendations  Supervision - Intermittent;No OT follow up    Equipment Recommendations  None recommended by OT    Recommendations for Other Services      Precautions / Restrictions Precautions Precautions: Back;Fall Precaution Comments: Back precautions s/p recent kyphoplasty; pt does not have brace at hospital. able to state 2/3 precautions Restrictions Other Position/Activity Restrictions: Pt reports being issued TLSO after kyphoplasty but unable to tolerate wearing it. He states it makes him feel like he cannot breathe.       Mobility Bed Mobility               General bed mobility comments: OOB in recliner  Transfers Overall transfer level: Needs assistance Equipment used: Rolling walker (2 wheeled);None Transfers: Sit to/from Stand Sit to Stand: Supervision         General transfer comment: supervision for safety, able to sit<>stand with RW and  without AD with supervision    Balance Overall balance assessment: Needs assistance Sitting-balance support: Feet supported;No upper extremity supported Sitting balance-Leahy Scale: Good     Standing balance support: No upper extremity supported;During functional activity Standing balance-Leahy Scale: Good Standing balance comment: able to complete standing grooming tasks at sink with no UE supported                           ADL either performed or assessed with clinical judgement   ADL Overall ADL's : Needs assistance/impaired     Grooming: Wash/dry face;Wash/dry hands;Standing Grooming Details (indicate cue type and reason): able to maintain back precautions with no cues                 Toilet Transfer: Min guard;Ambulation;Regular Glass blower/designer Details (indicate cue type and reason): min guard for safety holding on to IV pole Toileting- Clothing Manipulation and Hygiene: Supervision/safety;Sitting/lateral lean Toileting - Clothing Manipulation Details (indicate cue type and reason): reports he has been able to manage pericare while maintaining precautions     Functional mobility during ADLs: Min guard General ADL Comments: session focus on functional mobility, toilet transfer/ hygiene and standing grooming tasks     Vision       Perception     Praxis      Cognition Arousal/Alertness: Awake/alert Behavior During Therapy: WFL for tasks assessed/performed Overall Cognitive Status: Within Functional Limits for tasks assessed  General Comments: WFL for simple tasks        Exercises     Shoulder Instructions       General Comments upon OT arrival pt on RA O2 88% HR 94 bpm. Donned O2 at 2L with O2 increasin to 100%, however pt able to complete room level functional mobility on RA with O2 97% post ambulation    Pertinent Vitals/ Pain       Pain Assessment: Faces Faces Pain Scale: Hurts a  little bit Pain Location: Stomach/constipation > back Pain Descriptors / Indicators: Discomfort;Sore Pain Intervention(s): Limited activity within patient's tolerance;Monitored during session;Repositioned  Home Living                                          Prior Functioning/Environment              Frequency  Min 2X/week        Progress Toward Goals  OT Goals(current goals can now be found in the care plan section)  Progress towards OT goals: Progressing toward goals  Acute Rehab OT Goals Patient Stated Goal: independence OT Goal Formulation: With patient Time For Goal Achievement: 07/02/19 Potential to Achieve Goals: Good  Plan Discharge plan remains appropriate    Co-evaluation                 AM-PAC OT "6 Clicks" Daily Activity     Outcome Measure   Help from another person eating meals?: None Help from another person taking care of personal grooming?: None Help from another person toileting, which includes using toliet, bedpan, or urinal?: A Little Help from another person bathing (including washing, rinsing, drying)?: A Little Help from another person to put on and taking off regular upper body clothing?: None Help from another person to put on and taking off regular lower body clothing?: A Little 6 Click Score: 21    End of Session Equipment Utilized During Treatment: Rolling walker;Oxygen;Other (comment)(2L O2)  OT Visit Diagnosis: Other abnormalities of gait and mobility (R26.89);Pain   Activity Tolerance Patient tolerated treatment well   Patient Left in bed;with call bell/phone within reach   Nurse Communication Mobility status        Time: 4403-4742 OT Time Calculation (min): 22 min  Charges: OT General Charges $OT Visit: 1 Visit OT Treatments $Self Care/Home Management : 8-22 mins  Lanier Clam., COTA/L Acute Rehabilitation Services [phone] [place] 06/20/2019, 12:54 PM

## 2019-06-20 NOTE — Progress Notes (Addendum)
PROGRESS NOTE    [name]  JEH:[id number] DOB: Mar 06, 1930 DOA: 06/16/2019 PCP: Sharilyn Sites, MD   Brief Narrative:  HPI per Dr. Binnie Kand on 06/16/2019 [name] is a 86 y.o. male with medical history significant of COPD, myelodysplastic syndrome, recent admission for T12 kyphoplasty discharged 4 days ago, came to emergency room for fatigue general weakness chills for 1 day.  In the emergency room was found to have a fever 102.  He has back pain secondary to surgery but denies any drainage or increased swelling or any recent injury to his back.  Denies any nausea vomiting or diarrhea.  No urinary symptoms.  Denies any blood in his stools.  Denies any hematemesis or hemoptysis.  ED Course: in the emergency room he was found to be tachypneic hypoxic, Fever102 Started on oxygen nasal cannula saturation 94% Lactic acid was 2.0, procalcitonin pending Blood cultures were done Chest x-ray negative CT chest angio no pulmonary embolus bilateral infiltrates suspicion of pneumonia Patient started on vancomycin and cefepime with suspicion of hospital-acquired pneumonia On 06/10/2019 hemoglobin 9.6 white count 2.1 On 06/16/2019 hemoglobin 10.2 white count 12.9 Covid test respiratory panel negative  **Interim History Patient was started on HCAP treatment and will continue and de-escalated Abx and stopped IV Vancomycin given Negative PCR.  Also have added guaifenesin, flutter valve and incentive spirometer along DuoNeb.  Repeat chest x-ray yesterday showed "chronic lung disease and slight worsening of the bibasilar lung density that could be atelectasis or developing pneumonia" but patient feels that his shortness of breath is improving slightly. PT/OT recommending no Follow Up will need to trying to wean oxygen today and anticipate D/C Home in the next 24-48 Hours.   Assessment & Plan:   Principal Problem:   HAP (hospital-acquired pneumonia) Active Problems:   GERD  (gastroesophageal reflux disease)   Anemia   COPD exacerbation (HCC)   Myelodysplastic syndrome (HCC)   CKD (chronic kidney disease), stage III   Sepsis ([place])  Acute Respiratory Failure with Hypoxia Sepsis 2/2 to HCAP, improved Hospital-Acquired pneumonia, poA -Patient was discharged recently from the hospital 06/12/2019 when he spent few days in the hospital for back pain -Recent kyphoplasty 1/22 -Came with a chief complaint shortness of breath, some productive cough, fever, and chills -Septic on Admission with a TMax of 102, Pulse of 102, and RR of 27 and a White count 12.9 -WBC On 06/10/2019 was 2.1; Now WBC is 4.3 this AM  -COVID-19 respiratory panel negative -LA was elevated was 2.0 and now improved to 1.4 -PCT was 0.50 and repeat was 1.02 -CT Chest Angio showed "No CT evidence of pulmonary embolism. Mild bilateral upper lobe and bilateral lower lobe atelectasis and/or infiltrate." -Repeat CXR today showed "Stable atelectasis or infiltrates at the lung bases. Emphysema." -IVF Stopped now; will give a dose of IV Lasix 40 mg x 1 today given that he did appeared slightly volume overloaded -Add Guaifenesin 1200 mg po BID, flutter valve and incentive spirometry -Obtained Blood cultures and Urine Cx; Blood Cx x2 showed NGTD at 4 Days  -UA not revealing for UTI -Started on IV Vancomycin and Cefepime but will stop IV Vancomycin and leave IV Cefepime today and change to po in AM  -ABG done and showed a pH of 7.445, PCO2 35.1, PO2 77.0, bicarbonate level 23.7, and an ABG O2 saturation of 95% -Also continue Albuterol 2.5 mg IH q4hprn and will start Scheduled DuoNeb q6h -Currently takes Prednisone 5 mg po Daily  -Continue supplemental oxygen  via nasal cannula and wean O2 as tolerated -SpO2: 97 % O2 Flow Rate (L/min): 2 L/min; Attempt to wean and OOB to Chair  -Continuous pulse oximetry and maintain O2 saturations greater than 92% -We will need ambulatory home O2 screen prior to discharge and  will attempt today  -PT/OT to Evaluate and Treat and recommending Intermittent Supervision but no follow up   MyeloDysplastic Syndrome History of Pancytopenia -Patient with anemia and thrombocytopenia, neutropenia on last admission but here he has a leukocytosis in the setting of his infection and continues to be anemic and thrombocytopenic -On Procrit every month last dose a few days ago  -Today WBC showed 4.3, Hb/Hct of 8.1/24.9 and Platelet Count of 70,000 -Sees Hematology/oncology in [place] -Continue to Monitor for S/Sx of Bleeding -Repeat CBC in AM   COPD -Not on home oxygen usually but requiring 2 Liters here  -Home medications include albuterol inhaler.  -He tells me he takes Spiriva as well but not listed on his home MAR -C/w DuoNeb as above and as needed Albuterol q4hprn  -Currently not wheezing so we will hold off on steroids -C/w Antibiotics for suspected bacterial pneumonia and check MRSA PCR  Chronic Kidney Disease Stage IIIa.  Metabolic Acidosis, mild  -Chart review indicates he sees Nephrologist in [place].  -Creatinine baseline 1.6-2.0 range.  -Patient's BUN/Cr went from 19/1.41 -> 17/1.29 -> 18/1.27 -> 20/1.42 -> 20/1.18 -Continues to have a mild Metabolic Acidosis with a a CO2 of 21, AG of 7, and a Chloride Level of 110 -Give 1x Dose of IV Lasix 40 mg today  -Avoid Nephrotoxic Medications, Contrast Dyes, Hypotension and Renally Adjust Medications -Continue to Monitor and Trend Renal Function -Repeat CMP in AM   GERD -C/w PPI with Pantoprazole 40 mg po Daily   Acute on Chronic Low Back pain in the setting of T11 Compression Fracture s/p Recent Kyphoplasty 06/09/2019 -A little over a month ago patient underwent L2 vertebral augmentation per Dr. Kathyrn Sheriff and was doing well until he went to pick up a 4 foot long 2 x 4 a fewe weeks ago. He reports he felt a "pop" in his back and developed immediate back pain a few days ago when he was lifting a piece of ood  at [employer].  -Evaluated by neurosurgery last [facility] who opine current imaging shows chronic compression fractures and questionable acute T12 compression fracture.  -MRI Thoracic and Lumbar spine at that time showed "Truncated and motion degraded examination. Acute or subacute compression fracture of T11 with mild edema, approximately 25% height loss and 6 mm retropulsion. No associated spinal canal stenosis.  Multiple chronic compression fractures of the thoracic and lumbar spine. Heterogeneous bone marrow signal within the lower thoracic spine and lumbar spine, compatible with reported myelodysplastic syndrome." -Underwent Recent Kyphoplasty on 06/09/2019 -Continue with oxycodone-acetaminophen 1 tab p.o. but change from every 4 hours as needed to every 6 hours for severe pain along with a Lidoderm patch 1 patch transdermally every 12 hours as well as cyclobenzaprine 7.5 mg p.o. 3 times daily as needed for muscle spasms as well as diazepam 5 mg p.o. every 6 hours as needed for anxiety and muscle spasms -Added IV Fentanyl 12.5 mcg q2hprn for Severe Pain if po is ineffective; patient states that his fentanyl has working well for him.  Will add Fentanyl patch and will try low dose at 12 mcg today  -C/w Acetaminophen 650 mg p.o./RC every 6 hours as needed for mild pain  Hyperbilirubinemia, improved  -Patient's T Bili  went from 1.5 -> 1.7 and is now improved and is 1.0 -Continue to monitor and trend and repeat CMP in a.m.  Hypophosphatemia -Patient's Phos level was 2.3 -Replete with IV KPhos 10 mmol -Continue to Monitor and Replete as Necessary -Repeat Phos Level in AM   Underweight -Nutritionist Consulted for further evaluation and recommendations -Recommending Ensure Enlive po BID, MVI + Minerals, and Magic Cup BID with meals  DVT prophylaxis: SCDs given his Myelodysplastic Syndrome and Anemia and Thrombocytopenia  Code Status: FULL CODE  Family Communication: Discussed with Daughter  Stanton Kidney) over the telephone  Disposition Plan: Patient is from home and will require PT and OT evaluation prior to safe discharge disposition no need to clinically improve from his respiratory standpoint and be weaned off of oxygen and be afebrile for at least 24 hours prior to discharge.  He still remains on oxygen but he has been afebrile for last 24 hours.  PT OT recommending supervision/ intermittent and no PT/OT Follow up  Consultants:   None   Procedures:  None   Antimicrobials:  Anti-infectives (From admission, onward)   Start     Dose/Rate Route Frequency Ordered Stop   06/17/19 2130  vancomycin (VANCOCIN) IVPB 1000 mg/200 mL premix  Status:  Discontinued     1,000 mg 200 mL/hr over 60 Minutes Intravenous Every 24 hours 06/16/19 2126 06/20/19 0748   06/16/19 2200  ceFEPIme (MAXIPIME) 2 g in sodium chloride 0.9 % 100 mL IVPB     2 g 200 mL/hr over 30 Minutes Intravenous Every 12 hours 06/16/19 2126     06/16/19 2130  vancomycin (VANCOREADY) IVPB 1250 mg/250 mL     1,250 mg 166.7 mL/hr over 90 Minutes Intravenous  Once 06/16/19 2126 06/17/19 0045     Subjective: Seen and examined at bedside he states that his breathing was doing fairly well.  States that his back pain is his main issue now and was complaining of it being a 4 out of 10.  States that he put a lidocaine patch on it last night and that helped her sleep some.  No nausea or vomiting but states that his pain is constant and states the only real relief that he gets is from the fentanyl.  No other concerns or complaints at this time.  Objective: Vitals:   06/19/19 0734 06/19/19 0734 06/19/19 1332 06/19/19 2323  BP: 127/75 127/75 (!) 107/59 130/74  Pulse: 71 72 75 76  Resp: 16 16 19 20   Temp:   97.9 F (36.6 C) 98.8 F (37.1 C)  TempSrc:      SpO2: 97% 97% 95% 97%  Weight:      Height:        Intake/Output Summary (Last 24 hours) at 06/20/2019 0750 Last data filed at 06/20/2019 5621 Gross per 24 hour  Intake  966.5 ml  Output 2250 ml  Net -1283.5 ml   Filed Weights   06/16/19 2323  Weight: 62 kg   Examination: Physical Exam:  Constitutional: Patient is a frail elderly Caucasian male currently in no acute distress and is breathing well but still complain of some back pain Eyes: Lids and conjunctivae normal, sclerae anicteric  ENMT: External Ears, Nose appear normal. Grossly normal hearing. Mucous membranes are moist.  Neck: Appears normal, supple, no cervical masses, normal ROM, no appreciable thyromegaly; no JVD Respiratory: Diminished to auscultation bilaterally, no wheezing, rales, rhonchi or crackles. Normal respiratory effort and patient is not tachypenic. No accessory muscle use.  Wearing 2 L  of supplemental oxygen via nasal cannula Cardiovascular: RRR, no murmurs / rubs / gallops. S1 and S2 auscultated.  Has distal 1+ lower extremity pitting edema bilaterally Abdomen: Soft, non-tender, non-distended. Bowel sounds positive.  GU: Deferred. Musculoskeletal: No clubbing / cyanosis of digits/nails. No joint deformity upper and lower extremities.  Has a lidocaine patch on the back Skin: No rashes, lesions, ulcers for skin evaluation. No induration; Warm and dry.  Neurologic: CN 2-12 grossly intact with no focal deficits. Romberg sign and cerebellar reflexes not assessed.  Psychiatric: Normal judgment and insight. Alert and oriented x 3. Pleasant mood and appropriate affect.   Data Reviewed: I have personally reviewed following labs and imaging studies  CBC: Recent Labs  Lab 06/16/19 1600 06/16/19 1600 06/16/19 2008 06/17/19 0032 06/18/19 0407 06/19/19 0236 06/20/19 0250  WBC 12.9*  --   --  12.8* 6.3 5.2 4.3  NEUTROABS 10.2*  --   --  9.7* 4.8 3.7 3.0  HGB 10.2*   < > 7.5* 9.3* 7.3* 7.9* 8.1*  HCT 32.7*   < > 22.0* 28.4* 22.7* 24.3* 24.9*  MCV 92.6  --   --  90.2 89.4 87.4 89.9  PLT 97*  --   --  87* 66* 72* 70*   < > = values in this interval not displayed.   Basic Metabolic  Panel: Recent Labs  Lab 06/16/19 1600 06/16/19 1600 06/16/19 2008 06/17/19 0032 06/18/19 0407 06/19/19 0236 06/20/19 0250  NA 138   < > 138 135 139 138 137  K 3.6   < > 3.6 3.8 3.7 3.8 4.0  CL 105  --   --  105 110 110 107  CO2 22  --   --  23 21* 21* 21*  GLUCOSE 101*  --   --  100* 88 91 99  BUN 19  --   --  17 18 20 20   CREATININE 1.41*  --   --  1.29* 1.27* 1.42* 1.18  CALCIUM 9.7  --   --  9.0 8.5* 8.7* 9.0  MG 1.8  --   --   --  1.9 1.9 1.9  PHOS  --   --   --   --  2.8 2.3* 2.6   < > = values in this interval not displayed.   GFR: Estimated Creatinine Clearance: 38.7 mL/min (by C-G formula based on SCr of 1.18 mg/dL). Liver Function Tests: Recent Labs  Lab 06/16/19 1600 06/17/19 0032 06/18/19 0407 06/19/19 0236 06/20/19 0250  AST 19 17 12* 15 14*  ALT 14 12 12 16 15   ALKPHOS 99 86 67 73 75  BILITOT 1.5* 1.7* 0.9 1.0 1.0  PROT 6.6 5.7* 4.8* 5.3* 5.5*  ALBUMIN 3.9 3.2* 2.4* 2.6* 2.8*   No results for input(s): LIPASE, AMYLASE in the last 168 hours. No results for input(s): AMMONIA in the last 168 hours. Coagulation Profile: No results for input(s): INR, PROTIME in the last 168 hours. Cardiac Enzymes: No results for input(s): CKTOTAL, CKMB, CKMBINDEX, TROPONINI in the last 168 hours. BNP (last 3 results) No results for input(s): PROBNP in the last 8760 hours. HbA1C: No results for input(s): HGBA1C in the last 72 hours. CBG: No results for input(s): GLUCAP in the last 168 hours. Lipid Profile: No results for input(s): CHOL, HDL, LDLCALC, TRIG, CHOLHDL, LDLDIRECT in the last 72 hours. Thyroid Function Tests: No results for input(s): TSH, T4TOTAL, FREET4, T3FREE, THYROIDAB in the last 72 hours. Anemia Panel: No results for input(s): VITAMINB12, FOLATE, FERRITIN,  TIBC, IRON, RETICCTPCT in the last 72 hours. Sepsis Labs: Recent Labs  Lab 06/16/19 1600 06/16/19 1758 06/17/19 0032  PROCALCITON  --   --  0.50  LATICACIDVEN 2.0* 1.4  --     Recent Results  (from the past 240 hour(s))  Blood culture (routine x 2)     Status: None (Preliminary result)   Collection Time: 06/16/19  4:04 PM   Specimen: BLOOD RIGHT ARM  Result Value Ref Range Status   Specimen Description BLOOD RIGHT ARM  Final   Special Requests   Final    BOTTLES DRAWN AEROBIC AND ANAEROBIC Blood Culture adequate volume Performed at [facility], [place] [address] [place], [place] [address]    Culture NO GROWTH 4 DAYS  Final   Report Status PENDING  Incomplete  Respiratory Panel by RT PCR (Flu A&B, Covid) - Nasopharyngeal Swab     Status: None   Collection Time: 06/16/19  4:28 PM   Specimen: Nasopharyngeal Swab  Result Value Ref Range Status   SARS Coronavirus 2 by RT PCR NEGATIVE NEGATIVE Final    Comment: (NOTE) SARS-CoV-2 target nucleic acids are NOT DETECTED. The SARS-CoV-2 RNA is generally detectable in upper respiratoy specimens during the acute phase of infection. The lowest concentration of SARS-CoV-2 viral copies this assay can detect is 131 copies/mL. A negative result does not preclude SARS-Cov-2 infection and should not be used as the sole basis for treatment or other patient management decisions. A negative result may occur with  improper specimen collection/handling, submission of specimen other than nasopharyngeal swab, presence of viral mutation(s) within the areas targeted by this assay, and inadequate number of viral copies (<131 copies/mL). A negative result must be combined with clinical observations, patient history, and epidemiological information. The expected result is Negative. Fact Sheet for Patients:  PinkCheek.be Fact Sheet for Healthcare Providers:  GravelBags.it This test is not yet ap proved or cleared by the [place] FDA and  has been authorized for detection and/or diagnosis of SARS-CoV-2 by FDA under an Emergency Use Authorization (EUA). This EUA will remain  in effect  (meaning this test can be used) for the duration of the COVID-19 declaration under Section 564(b)(1) of the Act, 21 U.S.C. section 360bbb-3(b)(1), unless the authorization is terminated or revoked sooner.    Influenza A by PCR NEGATIVE NEGATIVE Final   Influenza B by PCR NEGATIVE NEGATIVE Final    Comment: (NOTE) The Xpert Xpress SARS-CoV-2/FLU/RSV assay is intended as an aid in  the diagnosis of influenza from Nasopharyngeal swab specimens and  should not be used as a sole basis for treatment. Nasal washings and  aspirates are unacceptable for Xpert Xpress SARS-CoV-2/FLU/RSV  testing. Fact Sheet for Patients: PinkCheek.be Fact Sheet for Healthcare Providers: GravelBags.it This test is not yet approved or cleared by the [place] FDA and  has been authorized for detection and/or diagnosis of SARS-CoV-2 by  FDA under an Emergency Use Authorization (EUA). This EUA will remain  in effect (meaning this test can be used) for the duration of the  Covid-19 declaration under Section 564(b)(1) of the Act, 21  U.S.C. section 360bbb-3(b)(1), unless the authorization is  terminated or revoked. Performed at [facility], [place] [address] [place], [place] [address]   Blood culture (routine x 2)     Status: None (Preliminary result)   Collection Time: 06/16/19  5:59 PM   Specimen: BLOOD  Result Value Ref Range Status   Specimen Description BLOOD RIGHT ANTECUBITAL  Final   Special Requests   Final    BOTTLES DRAWN AEROBIC AND ANAEROBIC Blood Culture adequate volume Performed at [facility], [place] [address] [place], [place] [address]    Culture NO GROWTH 4 DAYS  Final   Report Status PENDING  Incomplete  Urine culture     Status: None   Collection Time: 06/16/19  6:43 PM   Specimen: Urine, Catheterized  Result Value Ref Range Status   Specimen Description URINE, CATHETERIZED  Final   Special Requests NONE  Final    Culture   Final    NO GROWTH Performed at [facility], [place] [address], [place], [place] [address]    Report Status 06/17/2019 FINAL  Final  MRSA PCR Screening     Status: None   Collection Time: 06/19/19  4:27 PM   Specimen: Nasopharyngeal  Result Value Ref Range Status   MRSA by PCR NEGATIVE NEGATIVE Final    Comment:        The GeneXpert MRSA Assay (FDA approved for NASAL specimens only), is one component of a comprehensive MRSA colonization surveillance program. It is not intended to diagnose MRSA infection nor to guide or monitor treatment for MRSA infections. Performed at [facility], [place] [address] [place], [place] [address]      Radiology Studies: DG CHEST PORT 1 VIEW  Result Date: 06/19/2019 CLINICAL DATA:  Shortness of breath and chest pain EXAM: PORTABLE CHEST 1 VIEW COMPARISON:  Yesterday FINDINGS: Low volume chest with reticulation at the lung bases. Borderline heart size. Mild aortic tortuosity. No pneumothorax. Bi apical pleural thickening that is benign by recent CT. IMPRESSION: Stable atelectasis or infiltrates at the lung bases. Electronically Signed   By: Monte Fantasia M.D.   On: 06/19/2019 08:01   Scheduled Meds: . feeding supplement (ENSURE ENLIVE)  237 mL Oral BID BM  . guaiFENesin  1,200 mg Oral BID  . multivitamin with minerals  1 tablet Oral Daily  . pantoprazole  40 mg Oral Daily  . predniSONE  5 mg Oral Q breakfast  . senna  1 tablet Oral Daily  . sodium chloride flush  3 mL Intravenous Q12H   Continuous Infusions: . ceFEPime (MAXIPIME) IV Stopped (06/19/19 2220)     LOS: 4 days    Kerney Elbe, DO [facility] PAGER is on [place]  If 7PM-7AM, please contact night-coverage [email or web address]

## 2019-06-21 ENCOUNTER — Other Ambulatory Visit: Payer: Self-pay

## 2019-06-21 ENCOUNTER — Inpatient Hospital Stay (HOSPITAL_COMMUNITY): Payer: Medicare Other

## 2019-06-21 LAB — CBC WITH DIFFERENTIAL/PLATELET
Abs Immature Granulocytes: 0.08 10*3/uL — ABNORMAL HIGH (ref 0.00–0.07)
Basophils Absolute: 0 10*3/uL (ref 0.0–0.1)
Basophils Relative: 1 %
Eosinophils Absolute: 0 10*3/uL (ref 0.0–0.5)
Eosinophils Relative: 0 %
HCT: 26.3 % — ABNORMAL LOW (ref 39.0–52.0)
Hemoglobin: 8.6 g/dL — ABNORMAL LOW (ref 13.0–17.0)
Immature Granulocytes: 2 %
Lymphocytes Relative: 25 %
Lymphs Abs: 0.9 10*3/uL (ref 0.7–4.0)
MCH: 29.3 pg (ref 26.0–34.0)
MCHC: 32.7 g/dL (ref 30.0–36.0)
MCV: 89.5 fL (ref 80.0–100.0)
Monocytes Absolute: 0.5 10*3/uL (ref 0.1–1.0)
Monocytes Relative: 15 %
Neutro Abs: 2 10*3/uL (ref 1.7–7.7)
Neutrophils Relative %: 57 %
Platelets: 78 10*3/uL — ABNORMAL LOW (ref 150–400)
RBC: 2.94 MIL/uL — ABNORMAL LOW (ref 4.22–5.81)
RDW: 19.5 % — ABNORMAL HIGH (ref 11.5–15.5)
WBC: 3.5 10*3/uL — ABNORMAL LOW (ref 4.0–10.5)
nRBC: 0 % (ref 0.0–0.2)

## 2019-06-21 LAB — COMPREHENSIVE METABOLIC PANEL
ALT: 15 U/L (ref 0–44)
AST: 15 U/L (ref 15–41)
Albumin: 2.9 g/dL — ABNORMAL LOW (ref 3.5–5.0)
Alkaline Phosphatase: 84 U/L (ref 38–126)
Anion gap: 10 (ref 5–15)
BUN: 27 mg/dL — ABNORMAL HIGH (ref 8–23)
CO2: 27 mmol/L (ref 22–32)
Calcium: 9.4 mg/dL (ref 8.9–10.3)
Chloride: 105 mmol/L (ref 98–111)
Creatinine, Ser: 1.39 mg/dL — ABNORMAL HIGH (ref 0.61–1.24)
GFR calc Af Amer: 52 mL/min — ABNORMAL LOW (ref >60)
GFR calc non Af Amer: 45 mL/min — ABNORMAL LOW (ref >60)
Glucose, Bld: 93 mg/dL (ref 70–99)
Potassium: 4.9 mmol/L (ref 3.5–5.1)
Sodium: 142 mmol/L (ref 135–145)
Total Bilirubin: 1.1 mg/dL (ref 0.3–1.2)
Total Protein: 6 g/dL — ABNORMAL LOW (ref 6.5–8.1)

## 2019-06-21 LAB — CULTURE, BLOOD (ROUTINE X 2)
Culture: NO GROWTH
Culture: NO GROWTH
Special Requests: ADEQUATE
Special Requests: ADEQUATE

## 2019-06-21 LAB — PHOSPHORUS: Phosphorus: 3.7 mg/dL (ref 2.5–4.6)

## 2019-06-21 LAB — MAGNESIUM: Magnesium: 2.1 mg/dL (ref 1.7–2.4)

## 2019-06-21 LAB — PROCALCITONIN: Procalcitonin: 0.65 ng/mL

## 2019-06-21 MED ORDER — ACETAMINOPHEN 325 MG PO TABS: 650.0000 mg | ORAL_TABLET | Freq: Four times a day (QID) | ORAL | Status: DC | PRN

## 2019-06-21 MED ORDER — ENSURE ENLIVE PO LIQD: 237.0000 mL | Freq: Two times a day (BID) | ORAL | Status: DC

## 2019-06-21 MED ORDER — PANTOPRAZOLE SODIUM 40 MG PO TBEC: 40.0000 mg | DELAYED_RELEASE_TABLET | Freq: Every day | ORAL | 0 refills | Status: DC

## 2019-06-21 NOTE — Care Management (Signed)
Pt deemed stable for discharge home today.  CM reviewed chart for TOC needs/orders - none determined.  Discharge order written - no outstanding TOC orders - CM signing off

## 2019-06-21 NOTE — Consult Note (Signed)
Patient moving out of state to [place] with daughter.  Natividad Brood, RN BSN [facility] Liaison  [phone] business mobile phone Toll free office [phone]  Fax number: [phone] [email or web address]

## 2019-06-21 NOTE — Discharge Instructions (Signed)
Healthcare-Associated Pneumonia  Healthcare-associated pneumonia is a lung infection that a person can get when in a health care setting or during certain procedures. The infection causes air sacs inside the lungs to fill with pus or fluid. Healthcare-associated pneumonia is usually caused by bacteria that are common in health care settings. These bacteria may be resistant to some antibiotic medicines. What are the causes? This condition is caused by bacteria that get into your lungs. You can get this condition if you:  Breathe in droplets from an infected person's cough or sneeze.  Touch something that an infected person coughed or sneezed on and then touch your mouth, nose, or eyes.  Have a bacterial infection somewhere else in your body, if the bacteria spread to your lungs through your blood. What increases the risk? This condition is more likely to develop in people who:  Have a disease that weakens their body's defense system (immune system) or their ability to cough out germs.  Are older than age 65.  Having trouble swallowing.  Use a feeding or breathing tube.  Have a cold or the flu.  Have an IV tube inserted in a vein.  Have surgery.  Have a bed sore.  Live in a long-term care facility, such as a nursing home.  Were in the hospital for two or more days in the past 3 months.  Received hemodialysis in the past 30 days. What are the signs or symptoms? Symptoms of this condition include:  Fever.  Chills.  Cough.  Shortness of breath.  Wheezing or crackling sounds when breathing. How is this diagnosed? This condition may be diagnosed based on:  Your symptoms.  A chest X-ray.  A measurement of the amount of oxygen in your blood. How is this treated? This condition is treated with antibiotics. Your health care provider may take a sample of cells (culture) from your throat to determine what type of bacteria is in your lungs and change your antibiotic based  on the results. If you have bacteria in your blood, trouble breathing, or a low oxygen level, you may need to be treated at the hospital. At the hospital, you will be given antibiotics through an IV tube. You may also be given oxygen or breathing treatments. Follow these instructions at home: Medicine  Take your antibiotic medicine as told by your health care provider. Do not stop taking the antibiotic even if you start to feel better.  Take over-the-counter and other prescription medicines only as told by your health care provider. Activity  Rest at home until you feel better.  Return to your normal activities as told by your health care provider. Ask your health care provider what activities are safe for you. General instructions   Drink enough fluid to keep your urine clear or pale yellow.  Do not use any products that contain nicotine or tobacco, such as cigarettes and e-cigarettes. If you need help quitting, ask your health care provider.  Limit alcohol intake to no more than 1 drink per day for nonpregnant women and 2 drinks per day for men. One drink equals 12 oz of beer, 5 oz of wine, or 1 oz of hard liquor.  Keep all follow-up visits as told by your health care provider. This is important. How is this prevented? Actions that I can take To lower your risk of getting this condition again:  Do not smoke. This includes e-cigarettes.  Do not drink too much alcohol.  Keep your immune system healthy by eating   well and getting enough sleep.  Get a flu shot every year (annually).  Get a pneumonia vaccination if: ? You are older than age 65. ? You smoke. ? You have a long-lasting condition like lung disease.  Exercise your lungs by taking deep breaths, walking, and using an incentive spirometer as directed.  Wash your hands often with soap and water. If you cannot get to a sink to wash your hands, use an alcohol-based hand cleaner.  Make sure your health care providers are  washing their hands. If you do not see them wash their hands, ask them to do so.  When you are in a health care facility, avoid touching your eyes, nose, and mouth.  Avoid touching any surface near where people have coughed or sneezed.  Stand away from sick people when they are coughing or sneezing.  Wear a mask if you cannot avoid exposure to people who are sick.  Clean all surfaces often with a disinfectant cleaner, especially if someone is sick at home or work.  Precautions of my health care team Hospitals, nursing homes, and other health care facilities take special care to try to prevent healthcare-associated pneumonia. To do this, your health care team may:  Clean their hands with soap and water or with alcohol-based hand sanitizer before and after seeing patients.  Wear gloves or masks during treatment.  Sanitize medical instruments, tubes, other equipment, and surfaces in patient rooms.  Raise (elevate) the head of your hospital bed so you are not lying flat. The head of the bed may be elevated 30 degrees or more.  Have you sit up and move around as soon as possible after surgery.  Only insert a breathing tube if needed.  Do these things for you if you have a breathing tube: ? Clean the inside of your mouth regularly. ? Remove the breathing tube as soon as it is no longer needed. Contact a health care provider if:  Your symptoms do not get better or they get worse.  Your symptoms come back after you have finished taking your antibiotics. Get help right away if:  You have trouble breathing.  You have confusion or difficulty thinking. This information is not intended to replace advice given to you by your health care provider. Make sure you discuss any questions you have with your health care provider. Document Revised: 04/16/2017 Document Reviewed: 01/31/2016 [employer] Patient Education  2020 [employer]  

## 2019-06-21 NOTE — Discharge Summary (Signed)
Physician Discharge Summary  [name] JJH:[id number] DOB: 1929/12/31 DOA: 06/16/2019  PCP: Sharilyn Sites, MD  Admit date: 06/16/2019 Discharge date: 06/21/2019  Time spent: 45 minutes  Recommendations for Outpatient Follow-up:  Patient will be discharged to home.  Patient will need to follow up with primary care provider within one week of discharge, discuss home health services.  Patient should continue medications as prescribed.  Patient should follow a regular diet.   Discharge Diagnoses:  Sepsis with Acute hypoxic respiratory failure secondary to HCAP Myelodysplastic syndrome/history of pancytopenia COPD Chronic kidney disease, stage IIIa/metabolic acidosis GERD Acute on chronic low back pain in the setting of T11 compression fracture Hyperbilirubinemia Hypophosphatemia Underweight  Discharge Condition: stable  Diet recommendation: regular  Filed Weights   06/16/19 2323  Weight: 62 kg    History of present illness:  On 06/16/2019 by Dr. Riki Rusk Cristescu [name] is a 86 y.o. male with medical history significant of COPD, myelodysplastic syndrome, recent admission for T12 kyphoplasty discharged 4 days ago, came to emergency room for fatigue general weakness chills for 1 day.  In the emergency room was found to have a fever 102.  He has back pain secondary to surgery but denies any drainage or increased swelling or any recent injury to his back.  Denies any nausea vomiting or diarrhea.  No urinary symptoms.  Denies any blood in his stools.  Denies any hematemesis or hemoptysis.  Hospital Course:  Sepsis with Acute hypoxic respiratory failure secondary to HCAP -Present on admission, as patient was tachypneic, leukocytosis, with fever of 102F -Patient was recently discharged from the hospital 06/12/2019 for kyphoplasty.  He then presented back to the hospital with complaints of shortness of breath, cough, fever and chills -CT chest angio showed no evidence of  pulmonary embolism.  Mild bilateral upper lobe and bilateral lower lobe atelectasis and/or infiltrate -COVID-19 unremarkable, procalcitonin 1.02 -Blood cultures show no growth to date -Patient was initially placed on IV fluids which was discontinued.  Patient was given 1 dose of IV Lasix as he was mildly overloaded -Patient initially started on duo nebs,  antitussives, IV vancomycin and cefepime- patient completed 5 days of cefepime -Patient was also noted to be hypoxic and was placed on supplemental oxygen.  Currently maintaining oxygen saturations in the low 90s on room air however while working with occupational therapy, oxygen saturations dropped to 88%; however recovered while doing laps in the room -PT and OT recommended home health  Myelodysplastic syndrome/history of pancytopenia -Patient with anemia and thrombocytopenia, neutropenia on last admission however has leukocytosis in the setting of infection -Patient receives Procrit every other month with his last dose being a few days prior to admission -Patient follows with hematology/oncology in [place]  COPD -Patient not on home oxygen however does need oxygen with ambulation -Was placed on DuoNeb treatments as well as albuterol as needed -Continue home medications on discharge  Chronic kidney disease, stage IIIa/metabolic acidosis -Chart review indicates that patient was following up with a nephrologist in [place], [place] -Creatinine at baseline, metabolic acidosis is resolved  GERD -Continue PPI  Acute on chronic low back pain in the setting of T11 compression fracture -Status post recent kyphoplasty on 06/09/2019 -Patient also underwent L2 vertebral augmentation for Dr. Kathyrn Sheriff approximately 1 month ago -Continue pain control with oxycodone, valium, cyclobenzaprine, and tylenol -PT and OT recommended home health -TOC consulted- daughter planning to talk patient back to  [place]  Hyperbilirubinemia -Resolved  Hypophosphatemia -Resolved with supplementation  Underweight -Nutrition  consulted, continue supplements  Procedures: None  Consultations: None  Discharge Exam: Vitals:   06/20/19 2334 06/21/19 0824  BP: (!) 100/54 (!) 100/59  Pulse: 75 76  Resp: 20 20  Temp: 98.2 F (36.8 C) 97.6 F (36.4 C)  SpO2: 92% 94%     General: Well developed, elderly, NAD  HEENT: NCAT, mucous membranes moist.  Cardiovascular: S1 S2 auscultated, RRR  Respiratory: Clear to auscultation bilaterally, no wheezing  Abdomen: Soft, nontender, nondistended, + bowel sounds  Extremities: warm dry without cyanosis clubbing or edema  Neuro: AAOx3, nonfocal  Psych: Appropriate mood and affect  Discharge Instructions Discharge Instructions    Discharge instructions   Complete by: As directed    Patient will be discharged to home.  Patient will need to follow up with primary care provider within one week of discharge, discuss home health services.  Patient should continue medications as prescribed.  Patient should follow a regular diet.     Allergies as of 06/21/2019   No Known Allergies     Medication List    TAKE these medications   acetaminophen 325 MG tablet Commonly known as: TYLENOL Take 2 tablets (650 mg total) by mouth every 6 (six) hours as needed for mild pain (or Fever >/= 101).   albuterol 108 (90 Base) MCG/ACT inhaler Commonly known as: VENTOLIN HFA Inhale 2 puffs into the lungs every 4 (four) hours as needed for wheezing or shortness of breath.   cyclobenzaprine 7.5 MG tablet Commonly known as: FEXMID Take 1 tablet (7.5 mg total) by mouth 3 (three) times daily as needed for muscle spasms.   diazepam 5 MG tablet Commonly known as: Valium Take 1 tablet (5 mg total) by mouth every 6 (six) hours as needed for anxiety (spasms).   feeding supplement (ENSURE ENLIVE) Liqd Take 237 mLs by mouth 2 (two) times daily between meals.    lidocaine 5 % Commonly known as: Lidoderm Place 1 patch onto the skin daily. Remove & Discard patch within 12 hours or as directed by MD What changed:   when to take this  reasons to take this   multivitamin with minerals Tabs tablet Take 1 tablet by mouth daily.   oxyCODONE-acetaminophen 5-325 MG tablet Commonly known as: PERCOCET/ROXICET Take 1 tablet by mouth every 4 (four) hours as needed. What changed: reasons to take this   pantoprazole 40 MG tablet Commonly known as: PROTONIX Take 1 tablet (40 mg total) by mouth daily. Start taking on: June 22, 2019   predniSONE 5 MG tablet Commonly known as: DELTASONE Take 5 tablets daily with breakfast for 5 days, then resume 1 tablet daily What changed:   how much to take  how to take this  when to take this  additional instructions   Procrit 40000 UNIT/ML injection Generic drug: epoetin alfa Inject 40,000 Units into the skin every 30 (thirty) days.   senna 8.6 MG Tabs tablet Commonly known as: SENOKOT Take 1 tablet (8.6 mg total) by mouth daily.      No Known Allergies Follow-up Information    Sharilyn Sites, MD. Schedule an appointment as soon as possible for a visit in 1 week(s).   Specialty: Family Medicine Why: Hospital follow up Contact information: [address] [place] [address] [phone]            The results of significant diagnostics from this hospitalization (including imaging, microbiology, ancillary and laboratory) are listed below for reference.    Significant Diagnostic Studies: DG Thoracic Spine 2  View  Result Date: 06/09/2019 CLINICAL DATA:  T11 kyphoplasty EXAM: THORACIC SPINE 2 VIEWS COMPARISON:  06/05/2019 FINDINGS: Intraoperative spot images demonstrate single level vertebral augmentation changes, reportedly T11. No visible complicating feature. IMPRESSION: Single level vertebral augmentation as above. Electronically Signed   By: Rolm Baptise M.D.   On: 06/09/2019  19:12   DG Thoracic Spine 2 View  Result Date: 05/31/2019 CLINICAL DATA:  Back pain after lifting this morning. EXAM: THORACIC SPINE 2 VIEWS COMPARISON:  Thoracic MRI, 09/21/2011. Chest radiographs, 12/08/2017. FINDINGS: There are wedge-shaped compression fractures of T6, T7, T8 and T9. T8 fracture is new since the prior thoracic MRI. However, it appears stable from prior lateral chest radiograph from July 2019. No convincing acute fracture. No malalignment. Skeletal structures are diffusely demineralized. Soft tissues are unremarkable. IMPRESSION: 1. No acute fracture. 2. Chronic midthoracic spine compression fractures. Diffuse skeletal demineralization. Electronically Signed   By: Lajean Manes M.D.   On: 05/31/2019 14:46   DG Lumbar Spine Complete  Result Date: 06/05/2019 CLINICAL DATA:  Back pain. Patient reports low back surgery 1 month ago, pain since that time. EXAM: LUMBAR SPINE - COMPLETE 4+ VIEW COMPARISON:  Lumbar spine CT 06/02/2019, thoracic and lumbar spine radiographs 05/31/2019 FINDINGS: Chronic compression fractures of L2, L3, L4 and L5, unchanged from recent CT. Vertebral augmentation within L2. Compression fracture of T11 and T9 are unchanged from recent radiograph. Bones are diffusely under mineralized. No evidence of acute fracture. Sacroiliac joints are congruent. IMPRESSION: Chronic compression fractures of T9, T11, L2, L3, L4 and L5 from recent imaging. No evidence of acute fracture. Electronically Signed   By: Keith Rake M.D.   On: 06/05/2019 23:28   DG Lumbar Spine Complete  Result Date: 05/31/2019 CLINICAL DATA:  Low back pain after lifting this morning. History of kyphoplasty 1 month ago. EXAM: LUMBAR SPINE - COMPLETE 4+ VIEW COMPARISON:  09/16/2017 FINDINGS: Previously seen fracture of L2 has been treated with kyphoplasty. There is a chronic fracture of L3. Is a mild to moderate compression deformity of L4 which appears increased in severity from the prior exam. Mild  loss vertebral body height of L5 is stable. No spondylolisthesis. Mild loss of disc height at L5-S1. Remaining discs are well preserved in height. Skeletal structures are diffusely demineralized. There are scattered calcifications along a normal caliber abdominal aorta. IMPRESSION: 1. Increase in loss of vertebral body height of L4 since the prior study, which could reflect an acute on chronic insufficiency fracture. 2. Other fractures are stable from prior radiographs. 3. No malalignment. Electronically Signed   By: Lajean Manes M.D.   On: 05/31/2019 14:42   CT Angio Chest PE W and/or Wo Contrast  Result Date: 06/16/2019 CLINICAL DATA:  Shortness of breath. EXAM: CT ANGIOGRAPHY CHEST WITH CONTRAST TECHNIQUE: Multidetector CT imaging of the chest was performed using the standard protocol during bolus administration of intravenous contrast. Multiplanar CT image reconstructions and MIPs were obtained to evaluate the vascular anatomy. CONTRAST:  13mL OMNIPAQUE IOHEXOL 350 MG/ML SOLN COMPARISON:  May 19, 2018 FINDINGS: Cardiovascular: Satisfactory opacification of the pulmonary arteries to the segmental level. No evidence of pulmonary embolism. Normal heart size. No pericardial effusion. Mediastinum/Nodes: No enlarged mediastinal, hilar, or axillary lymph nodes. Thyroid gland, trachea, and esophagus demonstrate no significant findings. Lungs/Pleura: Mild, stable areas of biapical scarring and/or atelectasis are seen. Mild atelectasis and/or infiltrate is seen within the posterior aspects of the bilateral upper lobes and posterior aspects of the bilateral lung bases, right greater than left.  The 11 mm lung nodule seen within the left upper lobe on the prior study is not clearly identified on the current exam. There is no evidence of a pleural effusion or pneumothorax. Upper Abdomen: Subcentimeter cysts are seen within the upper pole of the left kidney. Musculoskeletal: Multilevel degenerative changes seen  throughout the thoracic spine with chronic compression fracture deformity is seen at the levels of T6 and T9. Evidence of prior vertebroplasty is noted at the level of T11. Review of the MIP images confirms the above findings. IMPRESSION: 1. No CT evidence of pulmonary embolism. 2. Mild bilateral upper lobe and bilateral lower lobe atelectasis and/or infiltrate. Electronically Signed   By: Virgina Norfolk M.D.   On: 06/16/2019 20:57   CT Lumbar Spine Wo Contrast  Result Date: 06/02/2019 CLINICAL DATA:  Low back pain.  History of fracture. EXAM: CT LUMBAR SPINE WITHOUT CONTRAST TECHNIQUE: Multidetector CT imaging of the lumbar spine was performed without intravenous contrast administration. Multiplanar CT image reconstructions were also generated. COMPARISON:  CT lumbar spine 03/15/2019. Lumbar spine radiographs 05/31/2019. Lumbar MRI 04/16/2019 FINDINGS: Segmentation: Normal Alignment: Normal Vertebrae: Chronic compression fracture L2 with kyphoplasty. Fracture unchanged from the prior CT. Chronic compression fractures L3, L4, L5 unchanged from prior studies. No sacral adenopathy Paraspinal and other soft tissues: Atherosclerotic aorta without aneurysm. Large cyst right lower pole. Smaller cyst left lower pole. No renal obstruction or stone. No paraspinous adenopathy. Disc levels: T12-L1: Mild disc degeneration without stenosis L1-2: Mild disc bulging and mild facet degeneration without stenosis. L2-3: Moderate disc bulging and mild facet degeneration. Mild spinal stenosis L3-4: Moderate disc bulging. Bilateral facet degeneration with mild spinal stenosis L4-5: Diffuse disc bulging with mild to moderate facet degeneration. No significant stenosis L5-S1 disc degeneration with disc space narrowing and gas in the disc space. Mild spurring. Negative for spinal or foraminal stenosis IMPRESSION: No acute lumbar fracture. Chronic fractures L2, L3, L4, L5 appear unchanged from prior study Atherosclerotic aorta Lumbar  degenerative changes as above. Mild spinal stenosis L2-3 and L3-4. Electronically Signed   By: Franchot Gallo M.D.   On: 06/02/2019 13:10   MR THORACIC SPINE WO CONTRAST  Result Date: 06/07/2019 CLINICAL DATA:  Low back pain EXAM: MRI THORACIC AND LUMBAR SPINE WITHOUT CONTRAST TECHNIQUE: Multiplanar and multiecho pulse sequences of the thoracic and lumbar spine were obtained without intravenous contrast. COMPARISON:  None. FINDINGS: Examination is degraded by motion. Additionally, the patient could not tolerate the full length of the examination. Only 1 axial sequence of the thoracic spine was acquired. MRI THORACIC SPINE FINDINGS Alignment:  Normal alignment with increased thoracic kyphosis. Vertebrae: Acute compression fracture of T11 with approximately 25% central height loss and retropulsion measuring 6 mm. There are chronic compression deformities at T6 through T8. Cord:  Normal signal and morphology. Paraspinal and other soft tissues: Negative. Disc levels: Severe motion degradation on axial images. Within that limitation, there is no significant spinal canal stenosis. MRI LUMBAR SPINE FINDINGS Segmentation:  Standard. Alignment:  Physiologic. Vertebrae: Status post L2 vertebral augmentation. Approximately 50% central height loss at L2, L3 and L4. No acute fracture. Lumbar bone marrow signal is diffusely heterogeneous. Conus medullaris and cauda equina: Conus extends to the L1-2 disc level. Conus and cauda equina appear normal. Paraspinal and other soft tissues: Negative Disc levels: No axial imaging of the lumbar spine was acquired. Based on sagittal imaging, there is no spinal canal stenosis. There is moderate bilateral L5 neural foraminal stenosis. IMPRESSION: 1. Truncated and motion degraded examination. 2.  Acute or subacute compression fracture of T11 with mild edema, approximately 25% height loss and 6 mm retropulsion. No associated spinal canal stenosis. 3. Multiple chronic compression fractures of  the thoracic and lumbar spine. 4. Heterogeneous bone marrow signal within the lower thoracic spine and lumbar spine, compatible with reported myelodysplastic syndrome. Electronically Signed   By: Ulyses Jarred M.D.   On: 06/07/2019 20:59   MR LUMBAR SPINE WO CONTRAST  Result Date: 06/07/2019 CLINICAL DATA:  Low back pain EXAM: MRI THORACIC AND LUMBAR SPINE WITHOUT CONTRAST TECHNIQUE: Multiplanar and multiecho pulse sequences of the thoracic and lumbar spine were obtained without intravenous contrast. COMPARISON:  None. FINDINGS: Examination is degraded by motion. Additionally, the patient could not tolerate the full length of the examination. Only 1 axial sequence of the thoracic spine was acquired. MRI THORACIC SPINE FINDINGS Alignment:  Normal alignment with increased thoracic kyphosis. Vertebrae: Acute compression fracture of T11 with approximately 25% central height loss and retropulsion measuring 6 mm. There are chronic compression deformities at T6 through T8. Cord:  Normal signal and morphology. Paraspinal and other soft tissues: Negative. Disc levels: Severe motion degradation on axial images. Within that limitation, there is no significant spinal canal stenosis. MRI LUMBAR SPINE FINDINGS Segmentation:  Standard. Alignment:  Physiologic. Vertebrae: Status post L2 vertebral augmentation. Approximately 50% central height loss at L2, L3 and L4. No acute fracture. Lumbar bone marrow signal is diffusely heterogeneous. Conus medullaris and cauda equina: Conus extends to the L1-2 disc level. Conus and cauda equina appear normal. Paraspinal and other soft tissues: Negative Disc levels: No axial imaging of the lumbar spine was acquired. Based on sagittal imaging, there is no spinal canal stenosis. There is moderate bilateral L5 neural foraminal stenosis. IMPRESSION: 1. Truncated and motion degraded examination. 2. Acute or subacute compression fracture of T11 with mild edema, approximately 25% height loss and 6  mm retropulsion. No associated spinal canal stenosis. 3. Multiple chronic compression fractures of the thoracic and lumbar spine. 4. Heterogeneous bone marrow signal within the lower thoracic spine and lumbar spine, compatible with reported myelodysplastic syndrome. Electronically Signed   By: Ulyses Jarred M.D.   On: 06/07/2019 20:59   DG CHEST PORT 1 VIEW  Result Date: 06/20/2019 CLINICAL DATA:  Shortness of breath.  COPD EXAM: PORTABLE CHEST 1 VIEW COMPARISON:  Yesterday FINDINGS: Emphysema. There is hazy opacity at both lung bases . Normal heart size. Artifact from EKG leads. IMPRESSION: Stable atelectasis or infiltrates at the lung bases. Emphysema. Electronically Signed   By: Monte Fantasia M.D.   On: 06/20/2019 08:26   DG CHEST PORT 1 VIEW  Result Date: 06/19/2019 CLINICAL DATA:  Shortness of breath and chest pain EXAM: PORTABLE CHEST 1 VIEW COMPARISON:  Yesterday FINDINGS: Low volume chest with reticulation at the lung bases. Borderline heart size. Mild aortic tortuosity. No pneumothorax. Bi apical pleural thickening that is benign by recent CT. IMPRESSION: Stable atelectasis or infiltrates at the lung bases. Electronically Signed   By: Monte Fantasia M.D.   On: 06/19/2019 08:01   DG CHEST PORT 1 VIEW  Result Date: 06/18/2019 CLINICAL DATA:  Shortness of breath EXAM: PORTABLE CHEST 1 VIEW COMPARISON:  06/16/2019 FINDINGS: Heart size remains normal. Aortic atherosclerosis as seen previously. Chronic pulmonary scarring. Worsening patchy density at both lung bases consistent with atelectasis or basilar pneumonia. No effusion. IMPRESSION: Chronic lung disease. Slight worsening of bibasilar lung density that could be atelectasis or developing pneumonia. Electronically Signed   By: Jan Fireman.D.  On: 06/18/2019 06:34   DG Chest Portable 1 View  Result Date: 06/16/2019 CLINICAL DATA:  Weakness. EXAM: PORTABLE CHEST 1 VIEW COMPARISON:  May 19, 2018 FINDINGS: The lungs are hyperinflated. Mild,  chronic appearing increased interstitial lung markings are seen. There is no evidence of acute infiltrate, pleural effusion or pneumothorax. Mild, stable biapical pleural thickening is noted. The heart size and mediastinal contours are within normal limits. Multilevel degenerative changes are seen throughout the thoracic spine. Interval vertebroplasty is seen within the lower thoracic spine. IMPRESSION: No acute cardiopulmonary disease. Electronically Signed   By: Virgina Norfolk M.D.   On: 06/16/2019 16:10   DG C-Arm 1-60 Min  Result Date: 06/09/2019 CLINICAL DATA:  T11 fracture. EXAM: DG C-ARM 1-60 MIN FLUOROSCOPY TIME:  Fluoroscopy Time:  44 seconds Radiation Exposure Index (if provided by the fluoroscopic device): Not available Number of Acquired Spot Images: 2 COMPARISON:  MRI 06/07/2019 FINDINGS: Frontal and lateral fluoroscopic spot views obtained in the operating room. Kyphoplasty within lower thoracic vertebra, presumably T11. Spinal levels not well evaluated on the current exam. Chronic compression fracture of (tentative) T9. Total fluoroscopy time 44 seconds. IMPRESSION: Intraoperative fluoroscopy for kyphoplasty. Electronically Signed   By: Keith Rake M.D.   On: 06/09/2019 19:15    Microbiology: Recent Results (from the past 240 hour(s))  Blood culture (routine x 2)     Status: None (Preliminary result)   Collection Time: 06/16/19  4:04 PM   Specimen: BLOOD RIGHT ARM  Result Value Ref Range Status   Specimen Description BLOOD RIGHT ARM  Final   Special Requests   Final    BOTTLES DRAWN AEROBIC AND ANAEROBIC Blood Culture adequate volume Performed at [facility], [place] [address] [place], [place] [address]    Culture NO GROWTH 4 DAYS  Final   Report Status PENDING  Incomplete  Respiratory Panel by RT PCR (Flu A&B, Covid) - Nasopharyngeal Swab     Status: None   Collection Time: 06/16/19  4:28 PM   Specimen: Nasopharyngeal Swab  Result Value Ref Range Status   SARS  Coronavirus 2 by RT PCR NEGATIVE NEGATIVE Final    Comment: (NOTE) SARS-CoV-2 target nucleic acids are NOT DETECTED. The SARS-CoV-2 RNA is generally detectable in upper respiratoy specimens during the acute phase of infection. The lowest concentration of SARS-CoV-2 viral copies this assay can detect is 131 copies/mL. A negative result does not preclude SARS-Cov-2 infection and should not be used as the sole basis for treatment or other patient management decisions. A negative result may occur with  improper specimen collection/handling, submission of specimen other than nasopharyngeal swab, presence of viral mutation(s) within the areas targeted by this assay, and inadequate number of viral copies (<131 copies/mL). A negative result must be combined with clinical observations, patient history, and epidemiological information. The expected result is Negative. Fact Sheet for Patients:  PinkCheek.be Fact Sheet for Healthcare Providers:  GravelBags.it This test is not yet ap proved or cleared by the [place] FDA and  has been authorized for detection and/or diagnosis of SARS-CoV-2 by FDA under an Emergency Use Authorization (EUA). This EUA will remain  in effect (meaning this test can be used) for the duration of the COVID-19 declaration under Section 564(b)(1) of the Act, 21 U.S.C. section 360bbb-3(b)(1), unless the authorization is terminated or revoked sooner.    Influenza A by PCR NEGATIVE NEGATIVE Final   Influenza B by PCR NEGATIVE NEGATIVE Final    Comment: (NOTE) The Xpert Xpress SARS-CoV-2/FLU/RSV  assay is intended as an aid in  the diagnosis of influenza from Nasopharyngeal swab specimens and  should not be used as a sole basis for treatment. Nasal washings and  aspirates are unacceptable for Xpert Xpress SARS-CoV-2/FLU/RSV  testing. Fact Sheet for Patients: PinkCheek.be Fact Sheet  for Healthcare Providers: GravelBags.it This test is not yet approved or cleared by the [place] FDA and  has been authorized for detection and/or diagnosis of SARS-CoV-2 by  FDA under an Emergency Use Authorization (EUA). This EUA will remain  in effect (meaning this test can be used) for the duration of the  Covid-19 declaration under Section 564(b)(1) of the Act, 21  U.S.C. section 360bbb-3(b)(1), unless the authorization is  terminated or revoked. Performed at [facility], [place] [address], [place], [place] [address]   Blood culture (routine x 2)     Status: None (Preliminary result)   Collection Time: 06/16/19  5:59 PM   Specimen: BLOOD  Result Value Ref Range Status   Specimen Description BLOOD RIGHT ANTECUBITAL  Final   Special Requests   Final    BOTTLES DRAWN AEROBIC AND ANAEROBIC Blood Culture adequate volume Performed at [facility], [place] [address], [place], [place] [address]    Culture NO GROWTH 4 DAYS  Final   Report Status PENDING  Incomplete  Urine culture     Status: None   Collection Time: 06/16/19  6:43 PM   Specimen: Urine, Catheterized  Result Value Ref Range Status   Specimen Description URINE, CATHETERIZED  Final   Special Requests NONE  Final   Culture   Final    NO GROWTH Performed at [facility], [place] [address] [place], [place] [address]    Report Status 06/17/2019 FINAL  Final  MRSA PCR Screening     Status: None   Collection Time: 06/19/19  4:27 PM   Specimen: Nasopharyngeal  Result Value Ref Range Status   MRSA by PCR NEGATIVE NEGATIVE Final    Comment:        The GeneXpert MRSA Assay (FDA approved for NASAL specimens only), is one component of a comprehensive MRSA colonization surveillance program. It is not intended to diagnose MRSA infection nor to guide or monitor treatment for MRSA infections. Performed at [facility], [place] [address] [place], [place] [address]       Labs: Basic Metabolic Panel: Recent Labs  Lab 06/16/19 1600 06/16/19 2008 06/17/19 0032 06/18/19 0407 06/19/19 0236 06/20/19 0250 06/21/19 0207  NA 138   < > 135 139 138 137 142  K 3.6   < > 3.8 3.7 3.8 4.0 4.9  CL 105   < > 105 110 110 107 105  CO2 22   < > 23 21* 21* 21* 27  GLUCOSE 101*   < > 100* 88 91 99 93  BUN 19   < > 17 18 20 20  27*  CREATININE 1.41*   < > 1.29* 1.27* 1.42* 1.18 1.39*  CALCIUM 9.7   < > 9.0 8.5* 8.7* 9.0 9.4  MG 1.8  --   --  1.9 1.9 1.9 2.1  PHOS  --   --   --  2.8 2.3* 2.6 3.7   < > = values in this interval not displayed.   Liver Function Tests: Recent Labs  Lab 06/17/19 0032 06/18/19 0407 06/19/19 0236 06/20/19 0250 06/21/19 0207  AST 17 12* 15 14* 15  ALT 12 12 16 15  15  ALKPHOS 86 67 73 75 84  BILITOT 1.7* 0.9 1.0 1.0 1.1  PROT 5.7* 4.8* 5.3* 5.5* 6.0*  ALBUMIN 3.2* 2.4* 2.6* 2.8* 2.9*   No results for input(s): LIPASE, AMYLASE in the last 168 hours. No results for input(s): AMMONIA in the last 168 hours. CBC: Recent Labs  Lab 06/17/19 0032 06/18/19 0407 06/19/19 0236 06/20/19 0250 06/21/19 0207  WBC 12.8* 6.3 5.2 4.3 3.5*  NEUTROABS 9.7* 4.8 3.7 3.0 2.0  HGB 9.3* 7.3* 7.9* 8.1* 8.6*  HCT 28.4* 22.7* 24.3* 24.9* 26.3*  MCV 90.2 89.4 87.4 89.9 89.5  PLT 87* 66* 72* 70* 78*   Cardiac Enzymes: No results for input(s): CKTOTAL, CKMB, CKMBINDEX, TROPONINI in the last 168 hours. BNP: BNP (last 3 results) Recent Labs    06/16/19 1949  BNP 68.6    ProBNP (last 3 results) No results for input(s): PROBNP in the last 8760 hours.  CBG: No results for input(s): GLUCAP in the last 168 hours.     Signed:  Cristal Ford  [facility] 06/21/2019, 8:58 AM

## 2019-06-21 NOTE — Progress Notes (Signed)
New order to discharge patient home.  Patient in agreement with discharge.  AVS completed, printed and reviewed with patient.  All questions answered.  PIVs and tele discontinued.  Patient tolerated well.  Patient belongings packed.  NT to transport patient to vehicle via wheelchair.

## 2019-06-21 NOTE — Consult Note (Signed)
   [facility] CM Inpatient Consult   06/21/2019  [place] 1929/08/21 [id number]   Patient screened for extreme high risk score for unplanned readmission score for less than seven days readmission of 2 hospitalizations and 4 ED visits in the past 6 months.  Patient in the Medicare [place] [facility]. [ACO].  Chart briefly reviewed to check for potential [place] [facility] for post hospital follow up needs.   Review of patient's medical record reveals patient is to transition to [place] with his daughter per inpatient Transition of Care [TOC] RNCM notes.  Plan:  No current needs noted if plan remains for out of state with daughter.    For questions contact:   Natividad Brood, RN BSN [facility] Liaison  [phone] business mobile phone Toll free office [phone]  Fax number: [phone] [email or web address]

## 2019-06-22 DIAGNOSIS — A419 Sepsis, unspecified organism: Secondary | ICD-10-CM | POA: Diagnosis not present

## 2019-06-22 DIAGNOSIS — Z23 Encounter for immunization: Secondary | ICD-10-CM | POA: Diagnosis not present

## 2019-06-22 DIAGNOSIS — Z681 Body mass index (BMI) 19 or less, adult: Secondary | ICD-10-CM | POA: Diagnosis not present

## 2019-06-22 DIAGNOSIS — J189 Pneumonia, unspecified organism: Secondary | ICD-10-CM | POA: Diagnosis not present

## 2019-06-23 DIAGNOSIS — Z9981 Dependence on supplemental oxygen: Secondary | ICD-10-CM | POA: Diagnosis not present

## 2019-06-23 DIAGNOSIS — J984 Other disorders of lung: Secondary | ICD-10-CM | POA: Diagnosis not present

## 2019-06-23 DIAGNOSIS — N189 Chronic kidney disease, unspecified: Secondary | ICD-10-CM | POA: Diagnosis not present

## 2019-06-23 DIAGNOSIS — J449 Chronic obstructive pulmonary disease, unspecified: Secondary | ICD-10-CM | POA: Diagnosis not present

## 2019-06-23 DIAGNOSIS — D508 Other iron deficiency anemias: Secondary | ICD-10-CM | POA: Diagnosis not present

## 2019-06-23 DIAGNOSIS — R0902 Hypoxemia: Secondary | ICD-10-CM | POA: Diagnosis not present

## 2019-06-23 DIAGNOSIS — D649 Anemia, unspecified: Secondary | ICD-10-CM | POA: Diagnosis not present

## 2019-06-23 DIAGNOSIS — E441 Mild protein-calorie malnutrition: Secondary | ICD-10-CM | POA: Diagnosis not present

## 2019-06-23 DIAGNOSIS — Z681 Body mass index (BMI) 19 or less, adult: Secondary | ICD-10-CM | POA: Diagnosis not present

## 2019-06-26 ENCOUNTER — Ambulatory Visit: Payer: Self-pay | Admitting: *Deleted

## 2019-06-27 DIAGNOSIS — D469 Myelodysplastic syndrome, unspecified: Secondary | ICD-10-CM | POA: Diagnosis not present

## 2019-06-27 DIAGNOSIS — R911 Solitary pulmonary nodule: Secondary | ICD-10-CM | POA: Diagnosis not present

## 2019-06-28 DIAGNOSIS — D469 Myelodysplastic syndrome, unspecified: Secondary | ICD-10-CM | POA: Diagnosis not present

## 2019-06-28 DIAGNOSIS — K219 Gastro-esophageal reflux disease without esophagitis: Secondary | ICD-10-CM | POA: Diagnosis not present

## 2019-06-28 DIAGNOSIS — R911 Solitary pulmonary nodule: Secondary | ICD-10-CM | POA: Diagnosis not present

## 2019-06-28 DIAGNOSIS — M810 Age-related osteoporosis without current pathological fracture: Secondary | ICD-10-CM | POA: Diagnosis not present

## 2019-06-28 DIAGNOSIS — R627 Adult failure to thrive: Secondary | ICD-10-CM | POA: Diagnosis not present

## 2019-07-04 DIAGNOSIS — D469 Myelodysplastic syndrome, unspecified: Secondary | ICD-10-CM | POA: Diagnosis not present

## 2019-07-04 DIAGNOSIS — R911 Solitary pulmonary nodule: Secondary | ICD-10-CM | POA: Diagnosis not present

## 2019-07-05 DIAGNOSIS — M810 Age-related osteoporosis without current pathological fracture: Secondary | ICD-10-CM | POA: Diagnosis not present

## 2019-07-05 DIAGNOSIS — K219 Gastro-esophageal reflux disease without esophagitis: Secondary | ICD-10-CM | POA: Diagnosis not present

## 2019-07-05 DIAGNOSIS — D469 Myelodysplastic syndrome, unspecified: Secondary | ICD-10-CM | POA: Diagnosis not present

## 2019-07-05 DIAGNOSIS — R627 Adult failure to thrive: Secondary | ICD-10-CM | POA: Diagnosis not present

## 2019-07-05 DIAGNOSIS — R911 Solitary pulmonary nodule: Secondary | ICD-10-CM | POA: Diagnosis not present

## 2019-07-06 DIAGNOSIS — M810 Age-related osteoporosis without current pathological fracture: Secondary | ICD-10-CM | POA: Diagnosis not present

## 2019-07-07 DIAGNOSIS — R911 Solitary pulmonary nodule: Secondary | ICD-10-CM | POA: Diagnosis not present

## 2019-07-07 DIAGNOSIS — Z79899 Other long term (current) drug therapy: Secondary | ICD-10-CM | POA: Diagnosis not present

## 2019-07-07 DIAGNOSIS — D469 Myelodysplastic syndrome, unspecified: Secondary | ICD-10-CM | POA: Diagnosis not present

## 2019-07-21 DIAGNOSIS — Z23 Encounter for immunization: Secondary | ICD-10-CM | POA: Diagnosis not present

## 2019-08-03 DIAGNOSIS — C44319 Basal cell carcinoma of skin of other parts of face: Secondary | ICD-10-CM | POA: Diagnosis not present

## 2019-08-03 DIAGNOSIS — Z08 Encounter for follow-up examination after completed treatment for malignant neoplasm: Secondary | ICD-10-CM | POA: Diagnosis not present

## 2019-08-03 DIAGNOSIS — Z85828 Personal history of other malignant neoplasm of skin: Secondary | ICD-10-CM | POA: Diagnosis not present

## 2019-08-14 DIAGNOSIS — R911 Solitary pulmonary nodule: Secondary | ICD-10-CM | POA: Diagnosis not present

## 2019-08-14 DIAGNOSIS — R627 Adult failure to thrive: Secondary | ICD-10-CM | POA: Diagnosis not present

## 2019-08-14 DIAGNOSIS — K219 Gastro-esophageal reflux disease without esophagitis: Secondary | ICD-10-CM | POA: Diagnosis not present

## 2019-08-14 DIAGNOSIS — D469 Myelodysplastic syndrome, unspecified: Secondary | ICD-10-CM | POA: Diagnosis not present

## 2019-08-14 DIAGNOSIS — M810 Age-related osteoporosis without current pathological fracture: Secondary | ICD-10-CM | POA: Diagnosis not present

## 2019-08-21 DIAGNOSIS — J449 Chronic obstructive pulmonary disease, unspecified: Secondary | ICD-10-CM | POA: Diagnosis not present

## 2019-08-28 DIAGNOSIS — D469 Myelodysplastic syndrome, unspecified: Secondary | ICD-10-CM | POA: Diagnosis not present

## 2019-08-28 DIAGNOSIS — R911 Solitary pulmonary nodule: Secondary | ICD-10-CM | POA: Diagnosis not present

## 2019-09-20 DIAGNOSIS — J449 Chronic obstructive pulmonary disease, unspecified: Secondary | ICD-10-CM | POA: Diagnosis not present

## 2019-10-21 DIAGNOSIS — J449 Chronic obstructive pulmonary disease, unspecified: Secondary | ICD-10-CM | POA: Diagnosis not present

## 2019-11-20 DIAGNOSIS — J449 Chronic obstructive pulmonary disease, unspecified: Secondary | ICD-10-CM | POA: Diagnosis not present

## 2019-12-21 DIAGNOSIS — J449 Chronic obstructive pulmonary disease, unspecified: Secondary | ICD-10-CM | POA: Diagnosis not present

## 2020-01-21 DIAGNOSIS — J449 Chronic obstructive pulmonary disease, unspecified: Secondary | ICD-10-CM | POA: Diagnosis not present

## 2020-01-23 DIAGNOSIS — Z23 Encounter for immunization: Secondary | ICD-10-CM | POA: Diagnosis not present

## 2020-01-29 DIAGNOSIS — R079 Chest pain, unspecified: Secondary | ICD-10-CM | POA: Diagnosis not present

## 2020-01-29 DIAGNOSIS — R0789 Other chest pain: Secondary | ICD-10-CM | POA: Diagnosis not present

## 2020-02-20 DIAGNOSIS — J449 Chronic obstructive pulmonary disease, unspecified: Secondary | ICD-10-CM | POA: Diagnosis not present

## 2020-02-26 ENCOUNTER — Other Ambulatory Visit: Payer: Self-pay

## 2020-02-26 ENCOUNTER — Encounter: Payer: Self-pay | Admitting: Urology

## 2020-02-26 ENCOUNTER — Ambulatory Visit (INDEPENDENT_AMBULATORY_CARE_PROVIDER_SITE_OTHER): Payer: Medicare Other | Admitting: Urology

## 2020-02-26 VITALS — BP 110/67 | HR 106 | Temp 98.3°F

## 2020-02-26 DIAGNOSIS — N401 Enlarged prostate with lower urinary tract symptoms: Secondary | ICD-10-CM | POA: Insufficient documentation

## 2020-02-26 DIAGNOSIS — N138 Other obstructive and reflux uropathy: Secondary | ICD-10-CM | POA: Diagnosis not present

## 2020-02-26 DIAGNOSIS — M7010 Bursitis, unspecified hand: Secondary | ICD-10-CM | POA: Diagnosis not present

## 2020-02-26 DIAGNOSIS — R350 Frequency of micturition: Secondary | ICD-10-CM | POA: Insufficient documentation

## 2020-02-26 LAB — URINALYSIS, ROUTINE W REFLEX MICROSCOPIC
Bilirubin, UA: NEGATIVE
Glucose, UA: NEGATIVE
Ketones, UA: NEGATIVE
Nitrite, UA: NEGATIVE
Protein,UA: NEGATIVE
Specific Gravity, UA: 1.02 (ref 1.005–1.030)
Urobilinogen, Ur: 0.2 mg/dL (ref 0.2–1.0)
pH, UA: 5.5 (ref 5.0–7.5)

## 2020-02-26 LAB — MICROSCOPIC EXAMINATION
Bacteria, UA: NONE SEEN
Epithelial Cells (non renal): NONE SEEN /hpf (ref 0–10)
Renal Epithel, UA: NONE SEEN /hpf
WBC, UA: >30 /hpf — AB (ref 0–5)

## 2020-02-26 LAB — BLADDER SCAN AMB NON-IMAGING: Scan Result: 45

## 2020-02-26 MED ORDER — ALFUZOSIN HCL ER 10 MG PO TB24: 10.0000 mg | ORAL_TABLET | Freq: Every day | ORAL | 11 refills | Status: DC

## 2020-02-26 NOTE — Progress Notes (Signed)
02/26/2020 9:25 AM   Eyvonne Left [birth year] [id number]  Referring provider: Sharilyn Sites, MD [address] [place],  [place] [address]  Urinary frequency  HPI: Mr Klemens is a 86yo here for evaluation of urinary frequency and nocturia. He has noted increased urinary frequency every 1-2 hours, nocturia 4-5x over the past 6 months. He has an associated weak stream and starting/stopping of his stream. NO hesitancy, dysuria, or hematuria. No prior BPH therapy. NO urinary incontinence. No issues with constipation.  PVR 45cc.   PMH: Past Medical History:  Diagnosis Date  . Bone cancer ([place])   . Chronic kidney insufficiency   . COPD (chronic obstructive pulmonary disease) ([place])   . GERD (gastroesophageal reflux disease)   . Pulmonary nodule     Surgical History: Past Surgical History:  Procedure Laterality Date  . BACK SURGERY    . CATARACT EXTRACTION W/PHACO Right 03/09/2016   Procedure: CATARACT EXTRACTION PHACO AND INTRAOCULAR LENS PLACEMENT RIGHT EYE CDE=8.58;  Surgeon: Tonny Branch, MD;  Location: [facility];  Service: Ophthalmology;  Laterality: Right;  right  . CATARACT EXTRACTION W/PHACO Left 04/13/2016   Procedure: CATARACT EXTRACTION PHACO AND INTRAOCULAR LENS PLACEMENT (IOC);  Surgeon: Tonny Branch, MD;  Location: [facility];  Service: Ophthalmology;  Laterality: Left;  CDE: 8.08  . KYPHOPLASTY Bilateral 06/09/2019   Procedure: T11 KYPHOPLASTY;  Surgeon: Consuella Lose, MD;  Location: [place];  Service: Neurosurgery;  Laterality: Bilateral;  . None to Date  10/01/15    Home Medications:  Allergies as of 02/26/2020   No Known Allergies     Medication List       Accurate as of February 26, 2020  9:25 AM. If you have any questions, ask your nurse or doctor.        STOP taking these medications   acetaminophen 325 MG tablet Commonly known as: TYLENOL Stopped by: Nicolette Bang, MD   albuterol 108 (90 Base) MCG/ACT inhaler Commonly known as: VENTOLIN  HFA Stopped by: Nicolette Bang, MD   cyclobenzaprine 7.5 MG tablet Commonly known as: FEXMID Stopped by: Nicolette Bang, MD   diazepam 5 MG tablet Commonly known as: Valium Stopped by: Nicolette Bang, MD   feeding supplement (ENSURE ENLIVE) Liqd Stopped by: Nicolette Bang, MD   lidocaine 5 % Commonly known as: Lidoderm Stopped by: Nicolette Bang, MD   multivitamin with minerals Tabs tablet Stopped by: Nicolette Bang, MD   oxyCODONE-acetaminophen 5-325 MG tablet Commonly known as: PERCOCET/ROXICET Stopped by: Nicolette Bang, MD   pantoprazole 40 MG tablet Commonly known as: PROTONIX Stopped by: Nicolette Bang, MD   Procrit 40000 UNIT/ML injection Generic drug: epoetin alfa Stopped by: Nicolette Bang, MD   senna 8.6 MG Tabs tablet Commonly known as: SENOKOT Stopped by: Nicolette Bang, MD     TAKE these medications   predniSONE 10 MG tablet Commonly known as: DELTASONE Take 10 mg by mouth daily. What changed: Another medication with the same name was removed. Continue taking this medication, and follow the directions you see here. Changed by: Nicolette Bang, MD       Allergies: No Known Allergies  Family History: Family History  Problem Relation Age of Onset  . Kidney disease Mother   . Kidney disease Sister   . Colon cancer Neg Hx   . Gastric cancer Neg Hx   . Esophageal cancer Neg Hx     Social History:  reports that he quit smoking about 29 years ago. His smoking use included cigarettes and cigars. He quit  after 15.00 years of use. He has never used smokeless tobacco. He reports previous alcohol use. He reports that he does not use drugs.  ROS: All other review of systems were reviewed and are negative except what is noted above in HPI  Physical Exam: BP 110/67   Pulse (!) 106   Temp 98.3 F (36.8 C)   Constitutional:  Alert and oriented, No acute distress. HEENT: [place] AT, moist mucus membranes.  Trachea midline, no  masses. Cardiovascular: No clubbing, cyanosis, or edema. Respiratory: Normal respiratory effort, no increased work of breathing. GI: Abdomen is soft, nontender, nondistended, no abdominal masses GU: No CVA tenderness. Circumcised phallus. No masses/lesions on penis, testis, scrotum. Prostate 40g smooth no nodules no induration.  Lymph: No cervical or inguinal lymphadenopathy. Skin: No rashes, bruises or suspicious lesions. Neurologic: Grossly intact, no focal deficits, moving all 4 extremities. Psychiatric: Normal mood and affect.  Laboratory Data: Lab Results  Component Value Date   WBC 3.5 (L) 06/21/2019   HGB 8.6 (L) 06/21/2019   HCT 26.3 (L) 06/21/2019   MCV 89.5 06/21/2019   PLT 78 (L) 06/21/2019    Lab Results  Component Value Date   CREATININE 1.39 (H) 06/21/2019    No results found for: PSA  No results found for: TESTOSTERONE  No results found for: HGBA1C  Urinalysis    Component Value Date/Time   COLORURINE YELLOW 06/16/2019 1845   APPEARANCEUR CLEAR 06/16/2019 1845   LABSPEC 1.014 06/16/2019 1845   PHURINE 6.0 06/16/2019 1845   GLUCOSEU NEGATIVE 06/16/2019 1845   HGBUR SMALL (A) 06/16/2019 1845   BILIRUBINUR NEGATIVE 06/16/2019 1845   KETONESUR NEGATIVE 06/16/2019 1845   PROTEINUR NEGATIVE 06/16/2019 1845   UROBILINOGEN 0.2 08/15/2013 1240   NITRITE NEGATIVE 06/16/2019 1845   LEUKOCYTESUR NEGATIVE 06/16/2019 1845    Lab Results  Component Value Date   BACTERIA NONE SEEN 06/16/2019    Pertinent Imaging:  Results for orders placed during the hospital encounter of 03/03/05  DG Abd 1 View  Narrative History: Urosepsis  ABDOMEN ONE VIEW:  No prior exam for comparison.  Normal bowel gas pattern. No urinary tract calcification. Minimal atherosclerotic calcification. Compression deformity L3 vertebral body. Mild diffuse bony demineralization.  IMPRESSION: No acute abnormalities. Age-indeterminate compression deformity L3.  Provider: Lyda Jester  No results found for this or any previous visit.  No results found for this or any previous visit.  No results found for this or any previous visit.  Results for orders placed during the hospital encounter of 11/08/15  US Renal  Narrative CLINICAL DATA:  Stage 3 chronic kidney disease.  EXAM: RENAL / URINARY TRACT ULTRASOUND COMPLETE  COMPARISON:  03/05/2005 abdominal sonogram. 02/03/2010 CT abdomen/pelvis.  FINDINGS: Right Kidney:  Length: 9.2 cm. Slightly small right kidney. Mildly echogenic right kidney. No right hydronephrosis. Exophytic simple 3.6 x 3.3 x 3.3 cm renal cyst in the lower right kidney. Simple appearing 1.2 x 1.2 x 1.6 cm renal cyst in the upper right kidney.  Left Kidney:  Length: 9.8 cm. Slightly small left kidney. Mildly echogenic left kidney. No left hydronephrosis. Exophytic simple 2.7 x 2.4 x 2.6 cm renal cyst in the lower left kidney. Additional simple appearing 1.7 cm and 1.3 cm renal cysts in the mid to upper left kidney.  Bladder:  Minimally distended and grossly normal.  IMPRESSION: 1. Small echogenic kidneys, consistent with the provided history of chronic kidney disease. 2. No hydronephrosis. 3. Simple renal cysts in both kidneys. 4. Minimally distended and  grossly normal bladder.   Electronically Signed By: Ilona Sorrel M.D. On: 11/08/2015 15:58  No results found for this or any previous visit.  No results found for this or any previous visit.  No results found for this or any previous visit.   Assessment & Plan:    1. Urinary frequency -We will trial Uroxatral 10mg  qhs - BLADDER SCAN AMB NON-IMAGING - Urinalysis, Routine w reflex microscopic  2. BPH with LUTS -Uroxatral 10mg  qhs   No follow-ups on file.  Nicolette Bang, MD  [facility] [place]

## 2020-02-26 NOTE — Progress Notes (Signed)
Urological Symptom Review  Patient is experiencing the following symptoms: Frequent urination Get up at night to urinate Stream starts and stops Trouble starting stream Weak stream   Review of Systems  Gastrointestinal (upper)  : Negative for upper GI symptoms  Gastrointestinal (lower) : Negative for lower GI symptoms  Constitutional : Negative for symptoms  Skin: Negative for skin symptoms  Eyes: Negative for eye symptoms  Ear/Nose/Throat : Negative for Ear/Nose/Throat symptoms  Hematologic/Lymphatic: Negative for Hematologic/Lymphatic symptoms  Cardiovascular : Negative for cardiovascular symptoms  Respiratory : Negative for respiratory symptoms  Endocrine: Negative for endocrine symptoms  Musculoskeletal: Negative for musculoskeletal symptoms  Neurological: Negative for neurological symptoms  Psychologic: Negative for psychiatric symptoms

## 2020-02-26 NOTE — Patient Instructions (Signed)

## 2020-02-29 ENCOUNTER — Telehealth: Payer: Self-pay

## 2020-02-29 NOTE — Telephone Encounter (Signed)
Pt called today and reports he is having dizziness after starting alfuzosin and feels "nervous" inside as well. Message sent to MD. Pt wishes to hold medication at this time.

## 2020-03-05 ENCOUNTER — Other Ambulatory Visit: Payer: Self-pay

## 2020-03-05 MED ORDER — TAMSULOSIN HCL 0.4 MG PO CAPS: 0.4000 mg | ORAL_CAPSULE | Freq: Every day | ORAL | 1 refills | Status: DC

## 2020-03-05 NOTE — Telephone Encounter (Signed)
Pt called and made aware. New Rx submitted. Pt will stop alfuzosin.

## 2020-03-05 NOTE — Telephone Encounter (Signed)
Please try floma x0.4mg  daily #30 1 refill

## 2020-03-25 ENCOUNTER — Ambulatory Visit: Payer: Medicare Other | Admitting: Urology

## 2020-04-03 ENCOUNTER — Observation Stay (HOSPITAL_COMMUNITY)
Admission: EM | Admit: 2020-04-03 | Discharge: 2020-04-04 | Disposition: A | Discharge status: Home / Self Care | Payer: Medicare Other | Attending: Family Medicine | Admitting: Family Medicine

## 2020-04-03 ENCOUNTER — Encounter (HOSPITAL_COMMUNITY): Payer: Self-pay | Admitting: Emergency Medicine

## 2020-04-03 ENCOUNTER — Emergency Department (HOSPITAL_COMMUNITY): Payer: Medicare Other

## 2020-04-03 ENCOUNTER — Other Ambulatory Visit: Payer: Self-pay

## 2020-04-03 ENCOUNTER — Observation Stay (HOSPITAL_COMMUNITY): Payer: Medicare Other

## 2020-04-03 DIAGNOSIS — R0902 Hypoxemia: Secondary | ICD-10-CM | POA: Diagnosis not present

## 2020-04-03 DIAGNOSIS — R0602 Shortness of breath: Secondary | ICD-10-CM | POA: Diagnosis not present

## 2020-04-03 DIAGNOSIS — N183 Chronic kidney disease, stage 3 unspecified: Secondary | ICD-10-CM | POA: Diagnosis present

## 2020-04-03 DIAGNOSIS — N1832 Chronic kidney disease, stage 3b: Secondary | ICD-10-CM | POA: Diagnosis not present

## 2020-04-03 DIAGNOSIS — R531 Weakness: Secondary | ICD-10-CM

## 2020-04-03 DIAGNOSIS — J189 Pneumonia, unspecified organism: Secondary | ICD-10-CM | POA: Diagnosis not present

## 2020-04-03 DIAGNOSIS — Z85830 Personal history of malignant neoplasm of bone: Secondary | ICD-10-CM | POA: Insufficient documentation

## 2020-04-03 DIAGNOSIS — D649 Anemia, unspecified: Secondary | ICD-10-CM | POA: Diagnosis not present

## 2020-04-03 DIAGNOSIS — Z87891 Personal history of nicotine dependence: Secondary | ICD-10-CM | POA: Diagnosis not present

## 2020-04-03 DIAGNOSIS — I700 Atherosclerosis of aorta: Secondary | ICD-10-CM | POA: Diagnosis not present

## 2020-04-03 DIAGNOSIS — R9431 Abnormal electrocardiogram [ECG] [EKG]: Secondary | ICD-10-CM | POA: Diagnosis not present

## 2020-04-03 DIAGNOSIS — J9611 Chronic respiratory failure with hypoxia: Secondary | ICD-10-CM | POA: Diagnosis present

## 2020-04-03 DIAGNOSIS — D469 Myelodysplastic syndrome, unspecified: Secondary | ICD-10-CM | POA: Diagnosis present

## 2020-04-03 DIAGNOSIS — D62 Acute posthemorrhagic anemia: Secondary | ICD-10-CM | POA: Diagnosis present

## 2020-04-03 DIAGNOSIS — I959 Hypotension, unspecified: Secondary | ICD-10-CM | POA: Diagnosis not present

## 2020-04-03 DIAGNOSIS — J9811 Atelectasis: Secondary | ICD-10-CM | POA: Diagnosis not present

## 2020-04-03 DIAGNOSIS — R609 Edema, unspecified: Secondary | ICD-10-CM | POA: Diagnosis not present

## 2020-04-03 DIAGNOSIS — J449 Chronic obstructive pulmonary disease, unspecified: Secondary | ICD-10-CM | POA: Diagnosis present

## 2020-04-03 DIAGNOSIS — J439 Emphysema, unspecified: Secondary | ICD-10-CM | POA: Diagnosis present

## 2020-04-03 DIAGNOSIS — Z20822 Contact with and (suspected) exposure to covid-19: Secondary | ICD-10-CM | POA: Insufficient documentation

## 2020-04-03 LAB — CBC WITH DIFFERENTIAL/PLATELET
Abs Immature Granulocytes: 0.05 10*3/uL (ref 0.00–0.07)
Basophils Absolute: 0 10*3/uL (ref 0.0–0.1)
Basophils Relative: 0 %
Eosinophils Absolute: 0 10*3/uL (ref 0.0–0.5)
Eosinophils Relative: 0 %
HCT: 22.1 % — ABNORMAL LOW (ref 39.0–52.0)
Hemoglobin: 6.6 g/dL — CL (ref 13.0–17.0)
Immature Granulocytes: 1 %
Lymphocytes Relative: 8 %
Lymphs Abs: 0.3 10*3/uL — ABNORMAL LOW (ref 0.7–4.0)
MCH: 26.8 pg (ref 26.0–34.0)
MCHC: 29.9 g/dL — ABNORMAL LOW (ref 30.0–36.0)
MCV: 89.8 fL (ref 80.0–100.0)
Monocytes Absolute: 0.2 10*3/uL (ref 0.1–1.0)
Monocytes Relative: 4 %
Neutro Abs: 3.4 10*3/uL (ref 1.7–7.7)
Neutrophils Relative %: 87 %
Platelets: 119 10*3/uL — ABNORMAL LOW (ref 150–400)
RBC: 2.46 MIL/uL — ABNORMAL LOW (ref 4.22–5.81)
RDW: 21.1 % — ABNORMAL HIGH (ref 11.5–15.5)
WBC: 4 10*3/uL (ref 4.0–10.5)
nRBC: 0 % (ref 0.0–0.2)

## 2020-04-03 LAB — COMPREHENSIVE METABOLIC PANEL
ALT: 10 U/L (ref 0–44)
AST: 12 U/L — ABNORMAL LOW (ref 15–41)
Albumin: 3.8 g/dL (ref 3.5–5.0)
Alkaline Phosphatase: 56 U/L (ref 38–126)
Anion gap: 9 (ref 5–15)
BUN: 36 mg/dL — ABNORMAL HIGH (ref 8–23)
CO2: 24 mmol/L (ref 22–32)
Calcium: 9.8 mg/dL (ref 8.9–10.3)
Chloride: 108 mmol/L (ref 98–111)
Creatinine, Ser: 1.87 mg/dL — ABNORMAL HIGH (ref 0.61–1.24)
GFR, Estimated: 34 mL/min — ABNORMAL LOW (ref >60)
Glucose, Bld: 104 mg/dL — ABNORMAL HIGH (ref 70–99)
Potassium: 4 mmol/L (ref 3.5–5.1)
Sodium: 141 mmol/L (ref 135–145)
Total Bilirubin: 0.9 mg/dL (ref 0.3–1.2)
Total Protein: 6.9 g/dL (ref 6.5–8.1)

## 2020-04-03 LAB — RESPIRATORY PANEL BY RT PCR (FLU A&B, COVID)
Influenza A by PCR: NEGATIVE
Influenza B by PCR: NEGATIVE
SARS Coronavirus 2 by RT PCR: NEGATIVE

## 2020-04-03 LAB — OCCULT BLOOD X 1 CARD TO LAB, STOOL: Fecal Occult Bld: NEGATIVE

## 2020-04-03 LAB — URINALYSIS, ROUTINE W REFLEX MICROSCOPIC
Bilirubin Urine: NEGATIVE
Glucose, UA: NEGATIVE mg/dL
Ketones, ur: NEGATIVE mg/dL
Nitrite: NEGATIVE
Protein, ur: NEGATIVE mg/dL
Specific Gravity, Urine: 1.014 (ref 1.005–1.030)
WBC, UA: >50 WBC/hpf — ABNORMAL HIGH (ref 0–5)
pH: 6 (ref 5.0–8.0)

## 2020-04-03 LAB — D-DIMER, QUANTITATIVE: D-Dimer, Quant: 1.59 ug/mL-FEU — ABNORMAL HIGH (ref 0.00–0.50)

## 2020-04-03 LAB — PREPARE RBC (CROSSMATCH)

## 2020-04-03 LAB — TROPONIN I (HIGH SENSITIVITY)
Troponin I (High Sensitivity): 32 ng/L — ABNORMAL HIGH (ref <18)
Troponin I (High Sensitivity): 24 ng/L — ABNORMAL HIGH (ref <18)

## 2020-04-03 MED ORDER — BISACODYL 10 MG RE SUPP: 10.0000 mg | Freq: Every day | RECTAL | Status: DC | PRN

## 2020-04-03 MED ORDER — ONDANSETRON HCL 4 MG PO TABS: 4.0000 mg | ORAL_TABLET | Freq: Four times a day (QID) | ORAL | Status: DC | PRN

## 2020-04-03 MED ORDER — SODIUM CHLORIDE 0.9 % IV SOLN
1.0000 g | Freq: Once | INTRAVENOUS | Status: AC
  Administered 2020-04-03: 1 g via INTRAVENOUS
  Filled 2020-04-03: qty 10

## 2020-04-03 MED ORDER — SODIUM CHLORIDE 0.9 % IV SOLN
1.0000 g | INTRAVENOUS | Status: DC
  Administered 2020-04-03: 1 g via INTRAVENOUS
  Filled 2020-04-03: qty 10

## 2020-04-03 MED ORDER — IOHEXOL 350 MG/ML SOLN
75.0000 mL | Freq: Once | INTRAVENOUS | Status: AC | PRN
  Administered 2020-04-03: 75 mL via INTRAVENOUS

## 2020-04-03 MED ORDER — ONDANSETRON HCL 4 MG/2ML IJ SOLN: 4.0000 mg | Freq: Four times a day (QID) | INTRAMUSCULAR | Status: DC | PRN

## 2020-04-03 MED ORDER — SODIUM CHLORIDE 0.9 % IV SOLN: 10.0000 mL/h | Freq: Once | INTRAVENOUS | Status: DC

## 2020-04-03 MED ORDER — TAMSULOSIN HCL 0.4 MG PO CAPS
0.4000 mg | ORAL_CAPSULE | Freq: Every day | ORAL | Status: DC
  Administered 2020-04-03 – 2020-04-04 (×2): 0.4 mg via ORAL
  Filled 2020-04-03 (×2): qty 1

## 2020-04-03 MED ORDER — ACETAMINOPHEN 325 MG PO TABS: 650.0000 mg | ORAL_TABLET | Freq: Four times a day (QID) | ORAL | Status: DC | PRN

## 2020-04-03 MED ORDER — SODIUM CHLORIDE 0.9% FLUSH
3.0000 mL | Freq: Two times a day (BID) | INTRAVENOUS | Status: DC
  Administered 2020-04-03: 3 mL via INTRAVENOUS

## 2020-04-03 MED ORDER — SODIUM CHLORIDE 0.9 % IV SOLN: INTRAVENOUS | Status: DC

## 2020-04-03 MED ORDER — ALBUTEROL SULFATE HFA 108 (90 BASE) MCG/ACT IN AERS: 2.0000 | INHALATION_SPRAY | RESPIRATORY_TRACT | Status: DC | PRN

## 2020-04-03 MED ORDER — SODIUM CHLORIDE 0.9% FLUSH
3.0000 mL | Freq: Two times a day (BID) | INTRAVENOUS | Status: DC
  Administered 2020-04-04: 3 mL via INTRAVENOUS

## 2020-04-03 MED ORDER — TIOTROPIUM BROMIDE MONOHYDRATE 18 MCG IN CAPS: 1.0000 | ORAL_CAPSULE | Freq: Every day | RESPIRATORY_TRACT | Status: DC

## 2020-04-03 MED ORDER — SODIUM CHLORIDE 0.9 % IV SOLN
INTRAVENOUS | Status: DC
Start: 2020-04-04 — End: 2020-04-03

## 2020-04-03 MED ORDER — SODIUM CHLORIDE 0.9% FLUSH: 3.0000 mL | INTRAVENOUS | Status: DC | PRN

## 2020-04-03 MED ORDER — UMECLIDINIUM BROMIDE 62.5 MCG/INH IN AEPB
1.0000 | INHALATION_SPRAY | Freq: Every day | RESPIRATORY_TRACT | Status: DC
  Administered 2020-04-04: 1 via RESPIRATORY_TRACT
  Filled 2020-04-03: qty 7

## 2020-04-03 MED ORDER — ACETAMINOPHEN 650 MG RE SUPP: 650.0000 mg | Freq: Four times a day (QID) | RECTAL | Status: DC | PRN

## 2020-04-03 MED ORDER — POLYETHYLENE GLYCOL 3350 17 G PO PACK: 17.0000 g | PACK | Freq: Every day | ORAL | Status: DC | PRN

## 2020-04-03 MED ORDER — TRAZODONE HCL 50 MG PO TABS: 50.0000 mg | ORAL_TABLET | Freq: Every evening | ORAL | Status: DC | PRN

## 2020-04-03 MED ORDER — FUROSEMIDE 10 MG/ML IJ SOLN
40.0000 mg | INTRAMUSCULAR | Status: DC
  Administered 2020-04-03: 40 mg via INTRAVENOUS
  Filled 2020-04-03: qty 4

## 2020-04-03 MED ORDER — SODIUM CHLORIDE 0.9 % IV SOLN: 250.0000 mL | INTRAVENOUS | Status: DC | PRN

## 2020-04-03 NOTE — ED Provider Notes (Signed)
[facility] EMERGENCY DEPARTMENT Provider Note   CSN: [id number] Arrival date & time: 04/03/20  4098     History Chief Complaint  Patient presents with  . Weakness    [name] is a 86 y.o. male.  Patient with myelodysplastic syndrome.  Patient complains of having no energy.  Patient also has COPD no fever no chills no cough  The history is provided by the patient and medical records. No language interpreter was used.  Weakness Severity:  Moderate Onset quality:  Sudden Timing:  Constant Progression:  Worsening Chronicity:  Recurrent Context: not alcohol use   Relieved by:  Nothing Worsened by:  Nothing Associated symptoms: no abdominal pain, no chest pain, no cough, no diarrhea, no frequency, no headaches and no seizures        Past Medical History:  Diagnosis Date  . Bone cancer ([place])   . Chronic kidney insufficiency   . COPD (chronic obstructive pulmonary disease) ([place])   . GERD (gastroesophageal reflux disease)   . Pulmonary nodule     Patient Active Problem List   Diagnosis Date Noted  . Symptomatic anemia 04/03/2020  . Urinary frequency 02/26/2020  . Benign prostatic hyperplasia with urinary obstruction 02/26/2020  . Myelodysplastic syndrome ([place]) 06/16/2019  . HAP (hospital-acquired pneumonia) 06/16/2019  . CKD (chronic kidney disease), stage III ([place]) 06/16/2019  . Sepsis ([place]) 06/16/2019  . Acute exacerbation of chronic low back pain 06/06/2019  . Thrombocytopenia ([place]) 06/06/2019  . Intractable back pain 06/06/2019  . COPD (chronic obstructive pulmonary disease) ([place])   . Chronic kidney insufficiency   . COPD with acute exacerbation ([place]) 05/20/2018  . COPD exacerbation ([place]) 05/19/2018  . Pulmonary nodule 05/19/2018  . Bronchiectasis ([place]) 05/19/2018  . Anemia 09/17/2017  . Abnormal CT of the abdomen 11/02/2016  . Loss of weight 11/02/2016  . History of colonic polyps 11/02/2016  . GERD (gastroesophageal reflux disease) 10/01/2015     Past Surgical History:  Procedure Laterality Date  . BACK SURGERY    . CATARACT EXTRACTION W/PHACO Right 03/09/2016   Procedure: CATARACT EXTRACTION PHACO AND INTRAOCULAR LENS PLACEMENT RIGHT EYE CDE=8.58;  Surgeon: Tonny Branch, MD;  Location: [facility];  Service: Ophthalmology;  Laterality: Right;  right  . CATARACT EXTRACTION W/PHACO Left 04/13/2016   Procedure: CATARACT EXTRACTION PHACO AND INTRAOCULAR LENS PLACEMENT (IOC);  Surgeon: Tonny Branch, MD;  Location: [facility];  Service: Ophthalmology;  Laterality: Left;  CDE: 8.08  . KYPHOPLASTY Bilateral 06/09/2019   Procedure: T11 KYPHOPLASTY;  Surgeon: Consuella Lose, MD;  Location: [place];  Service: Neurosurgery;  Laterality: Bilateral;  . None to Date  10/01/15       Family History  Problem Relation Age of Onset  . Kidney disease Mother   . Kidney disease Sister   . Colon cancer Neg Hx   . Gastric cancer Neg Hx   . Esophageal cancer Neg Hx     Social History   Tobacco Use  . Smoking status: Former Smoker    Years: 15.00    Types: Cigarettes, Cigars    Quit date: 10/01/1990    Years since quitting: 29.5  . Smokeless tobacco: Never Used  . Tobacco comment: Quit x 25-30 years; 2 cigars daily when smoked  Vaping Use  . Vaping Use: Never used  Substance Use Topics  . Alcohol use: Not Currently    Alcohol/week: 0.0 standard drinks  . Drug use: No    Home Medications Prior to Admission medications   Medication Sig Start  Date End Date Taking? Authorizing Provider  predniSONE (DELTASONE) 10 MG tablet Take 10 mg by mouth daily. 11/08/19   [provider]  tamsulosin (FLOMAX) 0.4 MG CAPS capsule Take 1 capsule (0.4 mg total) by mouth daily. 03/05/20   McKenzie, Candee Furbish, MD    Allergies    Patient has no known allergies.  Review of Systems   Review of Systems  Constitutional: Negative for appetite change and fatigue.  HENT: Negative for congestion, ear discharge and sinus pressure.   Eyes: Negative for discharge.   Respiratory: Negative for cough.   Cardiovascular: Negative for chest pain.  Gastrointestinal: Negative for abdominal pain and diarrhea.  Genitourinary: Negative for frequency and hematuria.  Musculoskeletal: Negative for back pain.  Skin: Negative for rash.  Neurological: Positive for weakness. Negative for seizures and headaches.  Psychiatric/Behavioral: Negative for hallucinations.    Physical Exam Updated Vital Signs BP 108/61   Pulse 86   Temp 97.8 F (36.6 C) (Oral)   Resp 17   Ht 6\' 4"  (1.93 m)   Wt 61.2 kg   SpO2 92%   BMI 16.43 kg/m   Physical Exam Vitals reviewed.  Constitutional:      Appearance: He is well-developed.  HENT:     Head: Normocephalic.     Nose: Nose normal.  Eyes:     General: No scleral icterus.    Conjunctiva/sclera: Conjunctivae normal.  Neck:     Thyroid: No thyromegaly.  Cardiovascular:     Rate and Rhythm: Normal rate and regular rhythm.     Heart sounds: No murmur heard.  No friction rub. No gallop.   Pulmonary:     Breath sounds: No stridor. No wheezing or rales.  Chest:     Chest wall: No tenderness.  Abdominal:     General: There is no distension.     Tenderness: There is no abdominal tenderness. There is no rebound.  Genitourinary:    Comments: Rectal heme-negative Musculoskeletal:        General: Normal range of motion.     Cervical back: Neck supple.  Lymphadenopathy:     Cervical: No cervical adenopathy.  Skin:    Findings: No erythema or rash.  Neurological:     Mental Status: He is alert and oriented to person, place, and time.     Motor: No abnormal muscle tone.     Coordination: Coordination normal.  Psychiatric:        Behavior: Behavior normal.     ED Results / Procedures / Treatments   Labs (all labs ordered are listed, but only abnormal results are displayed) Labs Reviewed  CBC WITH DIFFERENTIAL/PLATELET - Abnormal; Notable for the following components:      Result Value   RBC 2.46 (*)     Hemoglobin 6.6 (*)    HCT 22.1 (*)    MCHC 29.9 (*)    RDW 21.1 (*)    Platelets 119 (*)    Lymphs Abs 0.3 (*)    All other components within normal limits  COMPREHENSIVE METABOLIC PANEL - Abnormal; Notable for the following components:   Glucose, Bld 104 (*)    BUN 36 (*)    Creatinine, Ser 1.87 (*)    AST 12 (*)    GFR, Estimated 34 (*)    All other components within normal limits  D-DIMER, QUANTITATIVE (NOT AT [facility]) - Abnormal; Notable for the following components:   D-Dimer, Quant 1.59 (*)    All other components within normal limits  URINALYSIS, ROUTINE W REFLEX MICROSCOPIC - Abnormal; Notable for the following components:   APPearance HAZY (*)    Hgb urine dipstick SMALL (*)    Leukocytes,Ua MODERATE (*)    WBC, UA >50 (*)    Bacteria, UA MANY (*)    All other components within normal limits  TROPONIN I (HIGH SENSITIVITY) - Abnormal; Notable for the following components:   Troponin I (High Sensitivity) 32 (*)    All other components within normal limits  RESPIRATORY PANEL BY RT PCR (FLU A&B, COVID)  URINE CULTURE  OCCULT BLOOD X 1 CARD TO LAB, STOOL  PREPARE RBC (CROSSMATCH)  TYPE AND SCREEN  TROPONIN I (HIGH SENSITIVITY)    EKG None  Radiology DG Chest Port 1 View  Result Date: 04/03/2020 CLINICAL DATA:  Weakness EXAM: PORTABLE CHEST 1 VIEW COMPARISON:  06/21/2019 FINDINGS: Chronic interstitial prominence. Superimposed patchy density at the lung bases. No pleural effusion. Portion of the right costophrenic angle is excluded. Stable cardiomediastinal contours with normal heart size. IMPRESSION: New patchy interstitial changes superimposed on chronic interstitial prominence. Could reflect atelectasis or atypical pneumonia in the appropriate setting. Electronically Signed   By: Macy Mis M.D.   On: 04/03/2020 11:03    Procedures Procedures (including critical care time)  Medications Ordered in ED Medications  0.9 %  sodium chloride infusion (has no  administration in time range)  cefTRIAXone (ROCEPHIN) 1 g in sodium chloride 0.9 % 100 mL IVPB (has no administration in time range)    ED Course  I have reviewed the triage vital signs and the nursing notes.  Pertinent labs & imaging results that were available during my care of the patient were reviewed by me and considered in my medical decision making (see chart for details).    CRITICAL CARE Performed by: Milton Ferguson Total critical care time45 minutes Critical care time was exclusive of separately billable procedures and treating other patients. Critical care was necessary to treat or prevent imminent or life-threatening deterioration. Critical care was time spent personally by me on the following activities: development of treatment plan with patient and/or surrogate as well as nursing, discussions with consultants, evaluation of patient's response to treatment, examination of patient, obtaining history from patient or surrogate, ordering and performing treatments and interventions, ordering and review of laboratory studies, ordering and review of radiographic studies, pulse oximetry and re-evaluation of patient's condition.  MDM Rules/Calculators/A&P                          Patient with anemia from myelodysplastic syndrome.  He will be transfused 2 units and admitted to medicine with further work-up. Final Clinical Impression(s) / ED Diagnoses Final diagnoses:  Weakness    Rx / DC Orders ED Discharge Orders    None       Milton Ferguson, MD 04/08/20 [phone]

## 2020-04-03 NOTE — ED Triage Notes (Signed)
Pt brought by [facility]. Pt states he has had generalized weakness that started yesterday morning. Pt is chronically SOB. Pt has COPD. Pt wears 3L of o2 at home.

## 2020-04-03 NOTE — ED Notes (Signed)
[place] of attorney

## 2020-04-03 NOTE — H&P (Signed)
Patient Demographics:    [name], is a 86 y.o. male  MRN: [id number]   DOB - Dec 03, 1929  Admit Date - 04/03/2020  Outpatient Primary MD for the patient is Sharilyn Sites, MD   Assessment & Plan:    Principal Problem:   Symptomatic anemia Active Problems:   Myelodysplastic syndrome (HCC)   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   COPD (chronic obstructive pulmonary disease) (HCC)   CKD (chronic kidney disease), stage IIIB   Chronic respiratory failure with hypoxia /// 3L/min at baseline    1) acute on chronic symptomatic anemia--- patient has chronic anemia due to MDS baseline hemoglobin usually around 8 -Hemoglobin down to 6.6, patient presenting with more dyspnea at rest and significant dyspnea on exertion,, fatigue, poor endurance -No evidence of ongoing bleeding at this time,  stool Hemoccult is negative -We will transfuse 2 units of PRBC with Lasix in between -Patient to follow-up with hematologist post discharge  2) chronic hypoxic respiratory failure--- due to underlying COPD/emphysema, bronchiectasis and UIP ----at baseline patient uses 2 L of oxygen chronic -Dyspnea is worse due to #1 above -No evidence of acute COPD exacerbation at this time, continue bronchodilators and supplemental oxygen  3)BPH--stable, continue Flomax  4)Aki on CKD IIIB-  -Patient's creatinine currently 1.87 baseline usually around 1.4 -- renally adjust medications, avoid nephrotoxic agents / dehydration  / hypotension -Watch renal function closely especially with contrast exposure with CT chest done on 04/03/2020 -Gentle hydration with IV fluids, be judicious with fluids given the patient required PRBC transfusion to avoid volume overload  5)  elevated D-dimer--- dyspnea on exertion and hypoxia most likely related to #1 and #2 above -CTA chest without acute PE -Troponin noted -Elevated D-dimer most likely due to underlying UTI and MDS  6) presumed UTI--- IV Rocephin pending urine culture  7) MDS with chronic anemia and thrombocytopenia--platelets adequate, transfuse PRBC as above #1  Disposition/Need for in-Hospital Stay- patient unable to be discharged at this time due to ----worsening dyspnea, persistent hypoxia due to worsening acute on chronic symptomatic anemia requiring transfusion of PRBCs with Lasix in between   Dispo: The patient is from: Home              Anticipated d/c is to: Home              Anticipated d/c date is: 1 day              Patient currently is not medically stable to d/c. Barriers: Not Clinically Stable- --Needs PRBC transfusion with IV Lasix and close monitoring   With History of - Reviewed by me  Past Medical History:  Diagnosis Date  . Bone cancer ([place])   . Chronic kidney insufficiency   . COPD (chronic obstructive pulmonary disease) ([place])   . GERD (gastroesophageal reflux disease)   . Pulmonary nodule       Past Surgical History:  Procedure  Laterality Date  . BACK SURGERY    . CATARACT EXTRACTION W/PHACO Right 03/09/2016   Procedure: CATARACT EXTRACTION PHACO AND INTRAOCULAR LENS PLACEMENT RIGHT EYE CDE=8.58;  Surgeon: Tonny Branch, MD;  Location: [facility];  Service: Ophthalmology;  Laterality: Right;  right  . CATARACT EXTRACTION W/PHACO Left 04/13/2016   Procedure: CATARACT EXTRACTION PHACO AND INTRAOCULAR LENS PLACEMENT (IOC);  Surgeon: Tonny Branch, MD;  Location: [facility];  Service: Ophthalmology;  Laterality: Left;  CDE: 8.08  . KYPHOPLASTY Bilateral 06/09/2019   Procedure: T11 KYPHOPLASTY;  Surgeon: Consuella Lose, MD;  Location: [facility];  Service: Neurosurgery;  Laterality: Bilateral;  . None to Date  10/01/15      Chief Complaint  Patient presents with  . Weakness      HPI:      [name]  is a 86 y.o. male reformed smoker with past medical history relevant for COPD/emphysema with underlying bronchiectasis and UIP with chronic hypoxic respiratory failure chronically on 3 L of oxygen at baseline, MDS with chronic anemia and chronic thrombocytopenia as well as BPH and CKD 3B presenting to the ED with worsening shortness of breath/dyspnea, fatigue and some dizziness --In the ED patient is found to have a hemoglobin of 6.6 from a baseline usually around 8 -Denies ongoing bleeding no melena no epistasis -Stool occult blood is negative -Elevated D-dimer noted in the ED with shortness of breath but denies chest pains no fevers no chills no productive cough -CTA chest without acute PE -Creatinine is currently 1.87 which is higher than recent baseline, -LFTs are not elevated -Elevated at 1.59 , troponin noted -UA suggestive of UTI --EDP requested hospitalization for transfusion of PRBC in the setting of worsening dyspnea and symptomatic anemia,    Review of systems:    In addition to the HPI above,   A full Review of  Systems was done, all other systems reviewed are negative except as noted above in HPI , .    Social History:  Reviewed by me    Social History   Tobacco Use  . Smoking status: Former Smoker    Years: 15.00    Types: Cigarettes, Cigars    Quit date: 10/01/1990    Years since quitting: 29.5  . Smokeless tobacco: Never Used  . Tobacco comment: Quit x 25-30 years; 2 cigars daily when smoked  Substance Use Topics  . Alcohol use: Not Currently    Alcohol/week: 0.0 standard drinks       Family History :  Reviewed by me    Family History  Problem Relation Age of Onset  . Kidney disease Mother   . Kidney disease Sister   . Colon cancer Neg Hx   . Gastric cancer Neg Hx   . Esophageal cancer Neg Hx      Home Medications:   Prior to Admission medications   Medication Sig Start Date End Date Taking? Authorizing Provider  predniSONE  (DELTASONE) 10 MG tablet Take 10 mg by mouth daily. 11/08/19  Yes [provider]  SPIRIVA HANDIHALER 18 MCG inhalation capsule Place 1 capsule into inhaler and inhale daily. 03/24/20  Yes [provider]  tamsulosin (FLOMAX) 0.4 MG CAPS capsule Take 1 capsule (0.4 mg total) by mouth daily. 03/05/20  Yes McKenzie, Candee Furbish, MD     Allergies:    No Known Allergies   Physical Exam:   Vitals  Blood pressure (!) 106/58, pulse 70, temperature 97.8 F (36.6 C), temperature source Oral, resp. rate 16, height 6'  4" (1.93 m), weight 61.2 kg, SpO2 100 %.  Physical Examination: General appearance - alert, cachectic appearing, and in no distress Mental status - alert, oriented to person, place, and time,  Eyes - sclera anicteric Neck - supple, no JVD elevation , Chest - clear  to auscultation bilaterally, symmetrical air movement,  Heart - S1 and S2 normal, regular  Abdomen - soft, nontender, nondistended, no masses or organomegaly Neurological - screening mental status exam normal, neck supple without rigidity, cranial nerves II through XII intact, DTR's normal and symmetric Extremities - no pedal edema noted, intact peripheral pulses  Skin - warm, dry     Data Review:    CBC Recent Labs  Lab 04/03/20 1028  WBC 4.0  HGB 6.6*  HCT 22.1*  PLT 119*  MCV 89.8  MCH 26.8  MCHC 29.9*  RDW 21.1*  LYMPHSABS 0.3*  MONOABS 0.2  EOSABS 0.0  BASOSABS 0.0   ------------------------------------------------------------------------------------------------------------------  Chemistries  Recent Labs  Lab 04/03/20 1028  NA 141  K 4.0  CL 108  CO2 24  GLUCOSE 104*  BUN 36*  CREATININE 1.87*  CALCIUM 9.8  AST 12*  ALT 10  ALKPHOS 56  BILITOT 0.9   ------------------------------------------------------------------------------------------------------------------ estimated creatinine clearance is 23.6 mL/min (A) (by C-G formula based on SCr of 1.87 mg/dL  (H)). ------------------------------------------------------------------------------------------------------------------ No results for input(s): TSH, T4TOTAL, T3FREE, THYROIDAB in the last 72 hours.  Invalid input(s): FREET3   Coagulation profile No results for input(s): INR, PROTIME in the last 168 hours. ------------------------------------------------------------------------------------------------------------------- Recent Labs    04/03/20 1028  DDIMER 1.59*   -------------------------------------------------------------------------------------------------------------------  Cardiac Enzymes No results for input(s): CKMB, TROPONINI, MYOGLOBIN in the last 168 hours.  Invalid input(s): CK ------------------------------------------------------------------------------------------------------------------    Component Value Date/Time   BNP 68.6 06/16/2019 1949     ---------------------------------------------------------------------------------------------------------------  Urinalysis    Component Value Date/Time   COLORURINE YELLOW 04/03/2020 1029   APPEARANCEUR HAZY (A) 04/03/2020 1029   APPEARANCEUR Hazy (A) 02/26/2020 0910   LABSPEC 1.014 04/03/2020 1029   PHURINE 6.0 04/03/2020 1029   GLUCOSEU NEGATIVE 04/03/2020 1029   HGBUR SMALL (A) 04/03/2020 1029   BILIRUBINUR NEGATIVE 04/03/2020 1029   BILIRUBINUR Negative 02/26/2020 0910   KETONESUR NEGATIVE 04/03/2020 1029   PROTEINUR NEGATIVE 04/03/2020 1029   UROBILINOGEN 0.2 08/15/2013 1240   NITRITE NEGATIVE 04/03/2020 1029   LEUKOCYTESUR MODERATE (A) 04/03/2020 1029    ----------------------------------------------------------------------------------------------------------------   Imaging Results:    CT Angio Chest PE W and/or Wo Contrast  Result Date: 04/03/2020 CLINICAL DATA:  Shortness of breath EXAM: CT ANGIOGRAPHY CHEST WITH CONTRAST TECHNIQUE: Multidetector CT imaging of the chest was performed using the  standard protocol during bolus administration of intravenous contrast. Multiplanar CT image reconstructions and MIPs were obtained to evaluate the vascular anatomy. CONTRAST:  85mL OMNIPAQUE IOHEXOL 350 MG/ML SOLN COMPARISON:  Chest radiograph April 03, 2020; CT angiogram chest June 16, 2019. FINDINGS: Cardiovascular: There is no demonstrable pulmonary embolus. There is no thoracic aortic aneurysm or dissection. There are foci calcification in visualized great vessels. There are foci of aortic atherosclerosis as well as multiple foci of coronary artery calcification. There is no pericardial effusion or pericardial thickening. Mediastinum/Nodes: Thyroid appears normal. No evident thoracic adenopathy. No esophageal lesions are appreciable. Lungs/Pleura: There is underlying centrilobular and paraseptal emphysematous change. There is stable apical scarring bilaterally. There is extensive fibrotic change in the lung bases with bibasilar atelectasis. There is chronic appearing atelectasis in each posterior segment upper lobe region. No appreciable airspace consolidation.  No evident pleural effusions. Upper Abdomen: There is upper abdominal aortic atherosclerosis. There is an apparent cyst in the upper pole of the left kidney measuring 1.6 x 1.0 cm. Visualized upper abdominal structures otherwise appear unremarkable. Musculoskeletal: Patient is status post kyphoplasty at T11. Previous anterior wedge fractures at T6, T8, and T9 are stable with increased kyphosis. No lytic or destructive bone lesions are evident. No sclerotic lesions. No chest wall lesions. Review of the MIP images confirms the above findings. IMPRESSION: 1. No evident pulmonary embolus. No thoracic aortic aneurysm or dissection. There is aortic atherosclerosis as well as foci of great vessel and coronary artery calcification. 2. Underlying emphysematous change with fibrosis in the lung bases and a degree of bronchiectasis. Suspect usual interstitial  pneumonitis superimposed on emphysema. Areas of atelectatic change in the posterior segment of each lower lobe is essentially stable. No frank airspace consolidation. No pleural effusions. 3.  No evident adenopathy. 4. Stable wedge fractures at T6, T8, T9, and T11, status post kyphoplasty at T11. Increase in kyphosis. Aortic Atherosclerosis (ICD10-I70.0) and Emphysema (ICD10-J43.9). Electronically Signed   By: Lowella Grip III M.D.   On: 04/03/2020 15:00   DG Chest Port 1 View  Result Date: 04/03/2020 CLINICAL DATA:  Weakness EXAM: PORTABLE CHEST 1 VIEW COMPARISON:  06/21/2019 FINDINGS: Chronic interstitial prominence. Superimposed patchy density at the lung bases. No pleural effusion. Portion of the right costophrenic angle is excluded. Stable cardiomediastinal contours with normal heart size. IMPRESSION: New patchy interstitial changes superimposed on chronic interstitial prominence. Could reflect atelectasis or atypical pneumonia in the appropriate setting. Electronically Signed   By: Macy Mis M.D.   On: 04/03/2020 11:03    Radiological Exams on Admission: CT Angio Chest PE W and/or Wo Contrast  Result Date: 04/03/2020 CLINICAL DATA:  Shortness of breath EXAM: CT ANGIOGRAPHY CHEST WITH CONTRAST TECHNIQUE: Multidetector CT imaging of the chest was performed using the standard protocol during bolus administration of intravenous contrast. Multiplanar CT image reconstructions and MIPs were obtained to evaluate the vascular anatomy. CONTRAST:  86mL OMNIPAQUE IOHEXOL 350 MG/ML SOLN COMPARISON:  Chest radiograph April 03, 2020; CT angiogram chest June 16, 2019. FINDINGS: Cardiovascular: There is no demonstrable pulmonary embolus. There is no thoracic aortic aneurysm or dissection. There are foci calcification in visualized great vessels. There are foci of aortic atherosclerosis as well as multiple foci of coronary artery calcification. There is no pericardial effusion or pericardial  thickening. Mediastinum/Nodes: Thyroid appears normal. No evident thoracic adenopathy. No esophageal lesions are appreciable. Lungs/Pleura: There is underlying centrilobular and paraseptal emphysematous change. There is stable apical scarring bilaterally. There is extensive fibrotic change in the lung bases with bibasilar atelectasis. There is chronic appearing atelectasis in each posterior segment upper lobe region. No appreciable airspace consolidation. No evident pleural effusions. Upper Abdomen: There is upper abdominal aortic atherosclerosis. There is an apparent cyst in the upper pole of the left kidney measuring 1.6 x 1.0 cm. Visualized upper abdominal structures otherwise appear unremarkable. Musculoskeletal: Patient is status post kyphoplasty at T11. Previous anterior wedge fractures at T6, T8, and T9 are stable with increased kyphosis. No lytic or destructive bone lesions are evident. No sclerotic lesions. No chest wall lesions. Review of the MIP images confirms the above findings. IMPRESSION: 1. No evident pulmonary embolus. No thoracic aortic aneurysm or dissection. There is aortic atherosclerosis as well as foci of great vessel and coronary artery calcification. 2. Underlying emphysematous change with fibrosis in the lung bases and a degree of bronchiectasis. Suspect usual  interstitial pneumonitis superimposed on emphysema. Areas of atelectatic change in the posterior segment of each lower lobe is essentially stable. No frank airspace consolidation. No pleural effusions. 3.  No evident adenopathy. 4. Stable wedge fractures at T6, T8, T9, and T11, status post kyphoplasty at T11. Increase in kyphosis. Aortic Atherosclerosis (ICD10-I70.0) and Emphysema (ICD10-J43.9). Electronically Signed   By: Lowella Grip III M.D.   On: 04/03/2020 15:00   DG Chest Port 1 View  Result Date: 04/03/2020 CLINICAL DATA:  Weakness EXAM: PORTABLE CHEST 1 VIEW COMPARISON:  06/21/2019 FINDINGS: Chronic interstitial  prominence. Superimposed patchy density at the lung bases. No pleural effusion. Portion of the right costophrenic angle is excluded. Stable cardiomediastinal contours with normal heart size. IMPRESSION: New patchy interstitial changes superimposed on chronic interstitial prominence. Could reflect atelectasis or atypical pneumonia in the appropriate setting. Electronically Signed   By: Macy Mis M.D.   On: 04/03/2020 11:03    DVT Prophylaxis -SCD  AM Labs Ordered, also please review Full Orders  Family Communication: Admission, patients condition and plan of care including tests being ordered have been discussed with the patient  who indicate understanding and agree with the plan   Code Status - Full Code  Likely DC to  Home if hemodynamically stable posttransfusion  Condition   stable  Roxan Hockey M.D on 04/03/2020 at 6:21 PM Go to [email or web address] -  for contact info  [facility] - Office  [phone]

## 2020-04-04 ENCOUNTER — Other Ambulatory Visit: Payer: Self-pay

## 2020-04-04 DIAGNOSIS — D649 Anemia, unspecified: Secondary | ICD-10-CM | POA: Diagnosis not present

## 2020-04-04 LAB — CBC
HCT: 28.7 % — ABNORMAL LOW (ref 39.0–52.0)
Hemoglobin: 8.8 g/dL — ABNORMAL LOW (ref 13.0–17.0)
MCH: 27 pg (ref 26.0–34.0)
MCHC: 30.7 g/dL (ref 30.0–36.0)
MCV: 88 fL (ref 80.0–100.0)
Platelets: 155 10*3/uL (ref 150–400)
RBC: 3.26 MIL/uL — ABNORMAL LOW (ref 4.22–5.81)
RDW: 18.2 % — ABNORMAL HIGH (ref 11.5–15.5)
WBC: 3.8 10*3/uL — ABNORMAL LOW (ref 4.0–10.5)
nRBC: 0.8 % — ABNORMAL HIGH (ref 0.0–0.2)

## 2020-04-04 LAB — TYPE AND SCREEN
ABO/RH(D): B NEG
Antibody Screen: NEGATIVE
Unit division: 0
Unit division: 0

## 2020-04-04 LAB — BASIC METABOLIC PANEL
Anion gap: 9 (ref 5–15)
BUN: 40 mg/dL — ABNORMAL HIGH (ref 8–23)
CO2: 24 mmol/L (ref 22–32)
Calcium: 9 mg/dL (ref 8.9–10.3)
Chloride: 104 mmol/L (ref 98–111)
Creatinine, Ser: 1.73 mg/dL — ABNORMAL HIGH (ref 0.61–1.24)
GFR, Estimated: 38 mL/min — ABNORMAL LOW (ref >60)
Glucose, Bld: 98 mg/dL (ref 70–99)
Potassium: 3.3 mmol/L — ABNORMAL LOW (ref 3.5–5.1)
Sodium: 137 mmol/L (ref 135–145)

## 2020-04-04 LAB — BPAM RBC
Blood Product Expiration Date: 202112272359
Blood Product Expiration Date: 202112272359
ISSUE DATE / TIME: 202111171820
ISSUE DATE / TIME: 202111172346
Unit Type and Rh: 1700
Unit Type and Rh: 1700

## 2020-04-04 LAB — URINE CULTURE

## 2020-04-04 MED ORDER — CEPHALEXIN 250 MG PO CAPS: 250.0000 mg | ORAL_CAPSULE | Freq: Three times a day (TID) | ORAL | 0 refills | Status: DC

## 2020-04-04 MED ORDER — TAMSULOSIN HCL 0.4 MG PO CAPS: 0.4000 mg | ORAL_CAPSULE | Freq: Every day | ORAL | 2 refills | Status: DC

## 2020-04-04 MED ORDER — POTASSIUM CHLORIDE CRYS ER 20 MEQ PO TBCR
40.0000 meq | EXTENDED_RELEASE_TABLET | ORAL | Status: AC
  Administered 2020-04-04 (×2): 40 meq via ORAL
  Filled 2020-04-04 (×2): qty 2

## 2020-04-04 MED ORDER — PROCRIT 10000 UNIT/ML IJ SOLN: 10000.0000 [IU] | INTRAMUSCULAR | 5 refills | Status: DC

## 2020-04-04 MED ORDER — ACETAMINOPHEN 325 MG PO TABS: 650.0000 mg | ORAL_TABLET | Freq: Four times a day (QID) | ORAL | 2 refills | Status: DC | PRN

## 2020-04-04 MED ORDER — DARBEPOETIN ALFA 100 MCG/0.5ML IJ SOSY
100.0000 ug | PREFILLED_SYRINGE | Freq: Once | INTRAMUSCULAR | Status: AC
  Administered 2020-04-04: 100 ug via SUBCUTANEOUS
  Filled 2020-04-04: qty 0.5

## 2020-04-04 MED ORDER — ALBUTEROL SULFATE HFA 108 (90 BASE) MCG/ACT IN AERS: 2.0000 | INHALATION_SPRAY | RESPIRATORY_TRACT | 2 refills | Status: AC | PRN

## 2020-04-04 MED ORDER — SODIUM CHLORIDE 0.9 % IV SOLN
1.0000 g | Freq: Once | INTRAVENOUS | Status: AC
  Administered 2020-04-04: 1 g via INTRAVENOUS
  Filled 2020-04-04: qty 10

## 2020-04-04 MED ORDER — CEPHALEXIN 250 MG PO CAPS: 250.0000 mg | ORAL_CAPSULE | Freq: Three times a day (TID) | ORAL | 0 refills | Status: AC

## 2020-04-04 MED ORDER — SPIRIVA HANDIHALER 18 MCG IN CAPS: 1.0000 | ORAL_CAPSULE | Freq: Every day | RESPIRATORY_TRACT | 12 refills | Status: DC

## 2020-04-04 NOTE — Discharge Instructions (Signed)
1)Avoid ibuprofen/Advil/Aleve/Motrin/Goody Powders/Naproxen/BC powders/Meloxicam/Diclofenac/Indomethacin and other Nonsteroidal anti-inflammatory medications as these will make you more likely to bleed and can cause stomach ulcers, can also cause Kidney problems.   2)Please follow-up with hematologist/oncologist Dr. Derek Jack, MD -Address: inside [facility] (4th Floor), [address], [place], [place] [address] Phone: [phone]  3)Repeat CBC and CMP--- Blood test within a week

## 2020-04-04 NOTE — Discharge Summary (Signed)
[name], is a 86 y.o. male  DOB 08/12/1929  MRN [id number].  Admission date:  04/03/2020  Admitting Physician  Abel Hageman Denton Brick, MD  Discharge Date:  04/04/2020   Primary MD  Sharilyn Sites, MD  Recommendations for primary care physician for things to follow:   1)Avoid ibuprofen/Advil/Aleve/Motrin/Goody Powders/Naproxen/BC powders/Meloxicam/Diclofenac/Indomethacin and other Nonsteroidal anti-inflammatory medications as these will make you more likely to bleed and can cause stomach ulcers, can also cause Kidney problems.   2)Please follow-up with hematologist/oncologist Dr. Derek Jack, MD -Address: inside [facility] (4th Floor), [place], [place], [place] [address] Phone: [phone]  3)Repeat CBC and CMP--- Blood test within a week  Admission Diagnosis  Weakness [R53.1] Symptomatic anemia [D64.9]   Discharge Diagnosis  Weakness [R53.1] Symptomatic anemia [D64.9]   Principal Problem:   Symptomatic anemia Active Problems:   Myelodysplastic syndrome (HCC)   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   COPD (chronic obstructive pulmonary disease) (HCC)   CKD (chronic kidney disease), stage IIIB   Chronic respiratory failure with hypoxia /// 3L/min at baseline      Past Medical History:  Diagnosis Date  . Bone cancer ([place])   . Chronic kidney insufficiency   . COPD (chronic obstructive pulmonary disease) ([place])   . GERD (gastroesophageal reflux disease)   . Pulmonary nodule     Past Surgical History:  Procedure Laterality Date  . BACK SURGERY    . CATARACT EXTRACTION W/PHACO Right 03/09/2016   Procedure: CATARACT EXTRACTION PHACO AND INTRAOCULAR LENS PLACEMENT RIGHT EYE CDE=8.58;  Surgeon: Tonny Branch, MD;  Location: [facility];  Service: Ophthalmology;  Laterality: Right;  right  . CATARACT EXTRACTION  W/PHACO Left 04/13/2016   Procedure: CATARACT EXTRACTION PHACO AND INTRAOCULAR LENS PLACEMENT (IOC);  Surgeon: Tonny Branch, MD;  Location: [facility];  Service: Ophthalmology;  Laterality: Left;  CDE: 8.08  . KYPHOPLASTY Bilateral 06/09/2019   Procedure: T11 KYPHOPLASTY;  Surgeon: Consuella Lose, MD;  Location: [facility];  Service: Neurosurgery;  Laterality: Bilateral;  . None to Date  10/01/15     HPI  from the history and physical done on the day of admission:    [name]  is a 86 y.o. male reformed smoker with past medical history relevant for COPD/emphysema with underlying bronchiectasis and UIP with chronic hypoxic respiratory failure chronically on 3 L of oxygen at baseline, MDS with chronic anemia and chronic thrombocytopenia as well as BPH and CKD 3B presenting to the ED with worsening shortness of breath/dyspnea, fatigue and some dizziness --In the ED patient is found to have a hemoglobin of 6.6 from a baseline usually around 8 -Denies ongoing bleeding no melena no epistasis -Stool occult blood is negative -Elevated D-dimer noted in the ED with shortness of breath but denies chest pains no fevers no chills no productive cough -CTA chest without acute PE -Creatinine is currently 1.87 which is higher than recent baseline, -LFTs are not elevated -Elevated at 1.59 , troponin noted -UA suggestive of UTI --  EDP requested hospitalization for transfusion of PRBC in the setting of worsening dyspnea and symptomatic anemia,      Hospital Course:    1)Acute on chronic Symptomatic Anemia--- patient has chronic anemia due to MDS baseline hemoglobin usually around 8 -Hemoglobin is up to 8.8 from 6.6 after transfusion of 2 units of PRBCs -No evidence of ongoing bleeding at this time,  stool Hemoccult is negative -Patient to follow-up with hematologist Dr. Delton Coombes post discharge -Patient will benefit from monthly Procrit injections -Procrit injection x1 given prior to discharge  2)Chronic  Hypoxic Respiratory Failure--- due to underlying COPD / emphysema, bronchiectasis and UIP ----at baseline patient uses 2 L of oxygen chronic -No evidence of acute COPD exacerbation at this time, continue bronchodilators and supplemental oxygen -Overall improved after transfusion  3)BPH--stable, continue Flomax  4)Aki on CKD IIIB-  -Patient's creatinine currently 1.73 baseline usually around 1.4 -Repeat BMP as outpatient advised  5)Elevated D-dimer--- dyspnea on exertion and hypoxia most likely related to #1 and #2 above -CTA chest without acute PE -Troponin noted -Elevated D-dimer most likely due to underlyingMDS  6)Possible UTI--- treated with IV Rocephin, discharged on Keflex pending final urine culture  7) MDS with chronic anemia and thrombocytopenia--platelets adequate, transfuse PRBC as above #1 -Outpatient follow-up with hematologist as above #1  [place] home   Dispo: The patient is from: Home  Anticipated d/c is to: Home  Discharge Condition: Stable  Follow UP   Follow-up Information    Derek Jack, MD. Schedule an appointment as soon as possible for a visit in 1 week(s).   Specialty: Hematology Contact information: [place] [address] [phone]             Diet and Activity recommendation:  As advised  Discharge Instructions    Discharge Instructions    Call MD for:  difficulty breathing, headache or visual disturbances   Complete by: As directed    Call MD for:  persistant dizziness or light-headedness   Complete by: As directed    Call MD for:  persistant nausea and vomiting   Complete by: As directed    Call MD for:  severe uncontrolled pain   Complete by: As directed    Call MD for:  temperature >100.4   Complete by: As directed    Diet - low sodium heart healthy   Complete by: As directed    Discharge instructions   Complete by: As directed    1)Avoid  ibuprofen/Advil/Aleve/Motrin/Goody Powders/Naproxen/BC powders/Meloxicam/Diclofenac/Indomethacin and other Nonsteroidal anti-inflammatory medications as these will make you more likely to bleed and can cause stomach ulcers, can also cause Kidney problems.   2)Please follow-up with hematologist/oncologist Dr. Derek Jack, MD -Address: inside [facility] (4th Floor), [place], [place], [place] [address] Phone: [phone]  3)Repeat CBC and CMP--- Blood test within a week   Increase activity slowly   Complete by: As directed        Discharge Medications     Allergies as of 04/04/2020   No Known Allergies     Medication List    STOP taking these medications   predniSONE 10 MG tablet Commonly known as: DELTASONE     TAKE these medications   acetaminophen 325 MG tablet Commonly known as: TYLENOL Take 2 tablets (650 mg total) by mouth every 6 (six) hours as needed for mild pain, fever or headache (or Fever >/= 101).   albuterol 108 (90 Base) MCG/ACT inhaler Commonly known as: VENTOLIN HFA  Inhale 2 puffs into the lungs every 4 (four) hours as needed for wheezing or shortness of breath.   cephALEXin 250 MG capsule Commonly known as: Keflex Take 1 capsule (250 mg total) by mouth 3 (three) times daily for 3 days. For UTI Start taking on: April 05, 2020   Procrit 10000 UNIT/ML injection Generic drug: epoetin alfa Inject 1 mL (10,000 Units total) into the skin once a week.   Spiriva HandiHaler 18 MCG inhalation capsule Generic drug: tiotropium Place 1 capsule (18 mcg total) into inhaler and inhale daily. What changed: how much to take   tamsulosin 0.4 MG Caps capsule Commonly known as: FLOMAX Take 1 capsule (0.4 mg total) by mouth daily after supper. What changed: when to take this       Major procedures and Radiology Reports - PLEASE review detailed and final reports for all details, in brief -   CT Angio Chest PE W and/or Wo Contrast  Result  Date: 04/03/2020 CLINICAL DATA:  Shortness of breath EXAM: CT ANGIOGRAPHY CHEST WITH CONTRAST TECHNIQUE: Multidetector CT imaging of the chest was performed using the standard protocol during bolus administration of intravenous contrast. Multiplanar CT image reconstructions and MIPs were obtained to evaluate the vascular anatomy. CONTRAST:  53mL OMNIPAQUE IOHEXOL 350 MG/ML SOLN COMPARISON:  Chest radiograph April 03, 2020; CT angiogram chest June 16, 2019. FINDINGS: Cardiovascular: There is no demonstrable pulmonary embolus. There is no thoracic aortic aneurysm or dissection. There are foci calcification in visualized great vessels. There are foci of aortic atherosclerosis as well as multiple foci of coronary artery calcification. There is no pericardial effusion or pericardial thickening. Mediastinum/Nodes: Thyroid appears normal. No evident thoracic adenopathy. No esophageal lesions are appreciable. Lungs/Pleura: There is underlying centrilobular and paraseptal emphysematous change. There is stable apical scarring bilaterally. There is extensive fibrotic change in the lung bases with bibasilar atelectasis. There is chronic appearing atelectasis in each posterior segment upper lobe region. No appreciable airspace consolidation. No evident pleural effusions. Upper Abdomen: There is upper abdominal aortic atherosclerosis. There is an apparent cyst in the upper pole of the left kidney measuring 1.6 x 1.0 cm. Visualized upper abdominal structures otherwise appear unremarkable. Musculoskeletal: Patient is status post kyphoplasty at T11. Previous anterior wedge fractures at T6, T8, and T9 are stable with increased kyphosis. No lytic or destructive bone lesions are evident. No sclerotic lesions. No chest wall lesions. Review of the MIP images confirms the above findings. IMPRESSION: 1. No evident pulmonary embolus. No thoracic aortic aneurysm or dissection. There is aortic atherosclerosis as well as foci of great  vessel and coronary artery calcification. 2. Underlying emphysematous change with fibrosis in the lung bases and a degree of bronchiectasis. Suspect usual interstitial pneumonitis superimposed on emphysema. Areas of atelectatic change in the posterior segment of each lower lobe is essentially stable. No frank airspace consolidation. No pleural effusions. 3.  No evident adenopathy. 4. Stable wedge fractures at T6, T8, T9, and T11, status post kyphoplasty at T11. Increase in kyphosis. Aortic Atherosclerosis (ICD10-I70.0) and Emphysema (ICD10-J43.9). Electronically Signed   By: Lowella Grip III M.D.   On: 04/03/2020 15:00   DG Chest Port 1 View  Result Date: 04/03/2020 CLINICAL DATA:  Weakness EXAM: PORTABLE CHEST 1 VIEW COMPARISON:  06/21/2019 FINDINGS: Chronic interstitial prominence. Superimposed patchy density at the lung bases. No pleural effusion. Portion of the right costophrenic angle is excluded. Stable cardiomediastinal contours with normal heart size. IMPRESSION: New patchy interstitial changes superimposed on chronic interstitial prominence. Could reflect atelectasis  or atypical pneumonia in the appropriate setting. Electronically Signed   By: Macy Mis M.D.   On: 04/03/2020 11:03    Micro Results   Recent Results (from the past 240 hour(s))  Respiratory Panel by RT PCR (Flu A&B, Covid) - Nasopharyngeal Swab     Status: None   Collection Time: 04/03/20 10:32 AM   Specimen: Nasopharyngeal Swab  Result Value Ref Range Status   SARS Coronavirus 2 by RT PCR NEGATIVE NEGATIVE Final    Comment: (NOTE) SARS-CoV-2 target nucleic acids are NOT DETECTED.  The SARS-CoV-2 RNA is generally detectable in upper respiratoy specimens during the acute phase of infection. The lowest concentration of SARS-CoV-2 viral copies this assay can detect is 131 copies/mL. A negative result does not preclude SARS-Cov-2 infection and should not be used as the sole basis for treatment or other patient  management decisions. A negative result may occur with  improper specimen collection/handling, submission of specimen other than nasopharyngeal swab, presence of viral mutation(s) within the areas targeted by this assay, and inadequate number of viral copies (<131 copies/mL). A negative result must be combined with clinical observations, patient history, and epidemiological information. The expected result is Negative.  Fact Sheet for Patients:  PinkCheek.be  Fact Sheet for Healthcare Providers:  GravelBags.it  This test is no t yet approved or cleared by the [place] FDA and  has been authorized for detection and/or diagnosis of SARS-CoV-2 by FDA under an Emergency Use Authorization (EUA). This EUA will remain  in effect (meaning this test can be used) for the duration of the COVID-19 declaration under Section 564(b)(1) of the Act, 21 U.S.C. section 360bbb-3(b)(1), unless the authorization is terminated or revoked sooner.     Influenza A by PCR NEGATIVE NEGATIVE Final   Influenza B by PCR NEGATIVE NEGATIVE Final    Comment: (NOTE) The Xpert Xpress SARS-CoV-2/FLU/RSV assay is intended as an aid in  the diagnosis of influenza from Nasopharyngeal swab specimens and  should not be used as a sole basis for treatment. Nasal washings and  aspirates are unacceptable for Xpert Xpress SARS-CoV-2/FLU/RSV  testing.  Fact Sheet for Patients: PinkCheek.be  Fact Sheet for Healthcare Providers: GravelBags.it  This test is not yet approved or cleared by the [place] FDA and  has been authorized for detection and/or diagnosis of SARS-CoV-2 by  FDA under an Emergency Use Authorization (EUA). This EUA will remain  in effect (meaning this test can be used) for the duration of the  Covid-19 declaration under Section 564(b)(1) of the Act, 21  U.S.C. section 360bbb-3(b)(1),  unless the authorization is  terminated or revoked. Performed at [facility], [address], [place], [place] [address]     Today   Subjective    [name] today has no new complaints        -No further dizziness, no significant dyspnea on exertion overall patient feels better after transfusion of packed cells No fever  Or chills    Patient has been seen and examined prior to discharge   Objective   Blood pressure 90/68, pulse 68, temperature 97.8 F (36.6 C), resp. rate 20, height 6\' 4"  (1.93 m), weight 63.7 kg, SpO2 99 %.   Intake/Output Summary (Last 24 hours) at 04/04/2020 1259 Last data filed at 04/04/2020 0900 Gross per 24 hour  Intake 912.5 ml  Output 1700 ml  Net -787.5 ml    Exam Gen:- Awake Alert, no acute distress, cachectic appearing HEENT:- [place].[email or web address], No sclera icterus Nose- [place]  2 L/min Neck-Supple Neck,No JVD,.  Lungs-fair air movement, no wheezing  CV- S1, S2 normal, regular Abd-  +ve B.Sounds, Abd Soft, No tenderness,    Extremity/Skin:- No  edema,   good pulses Psych-affect is appropriate, oriented x3 Neuro-no new focal deficits, no tremors    Data Review   CBC w Diff:  Lab Results  Component Value Date   WBC 3.8 (L) 04/04/2020   HGB 8.8 (L) 04/04/2020   HCT 28.7 (L) 04/04/2020   PLT 155 04/04/2020   LYMPHOPCT 8 04/03/2020   MONOPCT 4 04/03/2020   EOSPCT 0 04/03/2020   BASOPCT 0 04/03/2020    CMP:  Lab Results  Component Value Date   NA 137 04/04/2020   K 3.3 (L) 04/04/2020   CL 104 04/04/2020   CO2 24 04/04/2020   BUN 40 (H) 04/04/2020   CREATININE 1.73 (H) 04/04/2020   PROT 6.9 04/03/2020   ALBUMIN 3.8 04/03/2020   BILITOT 0.9 04/03/2020   ALKPHOS 56 04/03/2020   AST 12 (L) 04/03/2020   ALT 10 04/03/2020  .   Total Discharge time is about 33 minutes  Roxan Hockey M.D on 04/04/2020 at 12:59 PM  Go to [email or web address] -  for contact info  [facility] - Office  [phone]

## 2020-04-05 LAB — URINE CULTURE: Special Requests: NORMAL

## 2020-04-17 ENCOUNTER — Inpatient Hospital Stay (HOSPITAL_COMMUNITY): Payer: Medicare Other

## 2020-04-17 ENCOUNTER — Other Ambulatory Visit: Payer: Self-pay

## 2020-04-17 ENCOUNTER — Inpatient Hospital Stay (HOSPITAL_COMMUNITY): Payer: Medicare Other | Attending: Hematology | Admitting: Hematology

## 2020-04-17 VITALS — BP 97/54 | HR 76 | Resp 16 | Ht 76.0 in | Wt 140.6 lb

## 2020-04-17 DIAGNOSIS — D469 Myelodysplastic syndrome, unspecified: Secondary | ICD-10-CM

## 2020-04-17 LAB — CBC WITH DIFFERENTIAL/PLATELET
Abs Immature Granulocytes: 0.02 10*3/uL (ref 0.00–0.07)
Basophils Absolute: 0 10*3/uL (ref 0.0–0.1)
Basophils Relative: 0 %
Eosinophils Absolute: 0 10*3/uL (ref 0.0–0.5)
Eosinophils Relative: 0 %
HCT: 28.1 % — ABNORMAL LOW (ref 39.0–52.0)
Hemoglobin: 8.6 g/dL — ABNORMAL LOW (ref 13.0–17.0)
Immature Granulocytes: 2 %
Lymphocytes Relative: 24 %
Lymphs Abs: 0.3 10*3/uL — ABNORMAL LOW (ref 0.7–4.0)
MCH: 27.7 pg (ref 26.0–34.0)
MCHC: 30.6 g/dL (ref 30.0–36.0)
MCV: 90.6 fL (ref 80.0–100.0)
Monocytes Absolute: 0.1 10*3/uL (ref 0.1–1.0)
Monocytes Relative: 4 %
Neutro Abs: 1 10*3/uL — ABNORMAL LOW (ref 1.7–7.7)
Neutrophils Relative %: 70 %
Platelets: 86 10*3/uL — ABNORMAL LOW (ref 150–400)
RBC: 3.1 MIL/uL — ABNORMAL LOW (ref 4.22–5.81)
RDW: 18.8 % — ABNORMAL HIGH (ref 11.5–15.5)
WBC: 1.4 10*3/uL — CL (ref 4.0–10.5)
nRBC: 0 % (ref 0.0–0.2)

## 2020-04-17 LAB — FOLATE: Folate: 19.6 ng/mL (ref >5.9)

## 2020-04-17 LAB — IRON AND TIBC
Iron: 177 ug/dL (ref 45–182)
Saturation Ratios: 71 % — ABNORMAL HIGH (ref 17.9–39.5)
TIBC: 250 ug/dL (ref 250–450)
UIBC: 73 ug/dL

## 2020-04-17 LAB — VITAMIN B12: Vitamin B-12: 3380 pg/mL — ABNORMAL HIGH (ref 180–914)

## 2020-04-17 LAB — RETICULOCYTES
Immature Retic Fract: 18.3 % — ABNORMAL HIGH (ref 2.3–15.9)
RBC.: 3.15 MIL/uL — ABNORMAL LOW (ref 4.22–5.81)
Retic Count, Absolute: 77.8 10*3/uL (ref 19.0–186.0)
Retic Ct Pct: 2.5 % (ref 0.4–3.1)

## 2020-04-17 LAB — LACTATE DEHYDROGENASE: LDH: 125 U/L (ref 98–192)

## 2020-04-17 LAB — FERRITIN: Ferritin: 310 ng/mL (ref 24–336)

## 2020-04-17 NOTE — Progress Notes (Signed)
[name], Third [name] [id number]   CLINIC:  Medical Oncology/Hematology  PCP:  Sharilyn Sites, [place] / [place] [address]  [phone]  REASON FOR VISIT:  Follow-up for MDS & anemia  PRIOR THERAPY: Blood transfusions  CURRENT THERAPY: Procrit monthly  INTERVAL HISTORY:  [name], a 86 y.o. male, returns for routine follow-up for his MDS and anemia. [name] was last seen on 09/17/2017. He went at [facility] on 11/17 after having weakness, SOB, fatigue and some dizziness.  Today he reports feeling okay. He reports that he has been travelling to [place] once a month to get a Procrit injection. He reports that the last injection he got was in August and has not gotten an injection since. He has tolerated all Procrit injections and blood transfusions well. His last blood transfusion before the hospital was in [place] in March 2021. He feels with his injections. He denies having nosebleeds, melena, hematochezia or hematuria. He continues having SOB with exertion but denies CP. His appetite is excellent.  He lives at home by himself and is able to do all of his ADL's and activities.   REVIEW OF SYSTEMS:  Review of Systems  Constitutional: Positive for fatigue (75%). Negative for appetite change.  HENT:   Negative for nosebleeds.   Respiratory: Positive for shortness of breath (w/ exertion).   Cardiovascular: Negative for chest pain.  Gastrointestinal: Negative for blood in stool.  Genitourinary: Negative for hematuria.   All other systems reviewed and are negative.   PAST MEDICAL/SURGICAL HISTORY:  Past Medical History:  Diagnosis Date  . Bone cancer ([place])   . Chronic kidney insufficiency   . COPD (chronic obstructive pulmonary disease) ([place])   . GERD (gastroesophageal reflux disease)   . Pulmonary nodule    Past Surgical History:  Procedure Laterality Date  . BACK SURGERY    . CATARACT EXTRACTION W/PHACO Right  03/09/2016   Procedure: CATARACT EXTRACTION PHACO AND INTRAOCULAR LENS PLACEMENT RIGHT EYE CDE=8.58;  Surgeon: Tonny Branch, MD;  Location: [facility];  Service: Ophthalmology;  Laterality: Right;  right  . CATARACT EXTRACTION W/PHACO Left 04/13/2016   Procedure: CATARACT EXTRACTION PHACO AND INTRAOCULAR LENS PLACEMENT (IOC);  Surgeon: Tonny Branch, MD;  Location: [facility];  Service: Ophthalmology;  Laterality: Left;  CDE: 8.08  . KYPHOPLASTY Bilateral 06/09/2019   Procedure: T11 KYPHOPLASTY;  Surgeon: Consuella Lose, MD;  Location: [facility];  Service: Neurosurgery;  Laterality: Bilateral;  . None to Date  10/01/15    SOCIAL HISTORY:  Social History   Socioeconomic History  . Marital status: Widowed    Spouse name: Not on file  . Number of children: Not on file  . Years of education: Not on file  . Highest education level: Not on file  Occupational History  . Not on file  Tobacco Use  . Smoking status: Former Smoker    Years: 15.00    Types: Cigarettes, Cigars    Quit date: 10/01/1990    Years since quitting: 29.5  . Smokeless tobacco: Never Used  . Tobacco comment: Quit x 25-30 years; 2 cigars daily when smoked  Vaping Use  . Vaping Use: Never used  Substance and Sexual Activity  . Alcohol use: Not Currently    Alcohol/week: 0.0 standard drinks  . Drug use: No  . Sexual activity: Never    Birth control/protection: None  Other Topics Concern  . Not on file  Social History Narrative  . Not  on file   Social Determinants of Health   Financial Resource Strain: High Risk  . Difficulty of Paying Living Expenses: Hard  Food Insecurity: No Food Insecurity  . Worried About Charity fundraiser in the Last Year: Never true  . Ran Out of Food in the Last Year: Never true  Transportation Needs: No Transportation Needs  . Lack of Transportation (Medical): No  . Lack of Transportation (Non-Medical): No  Physical Activity: Inactive  . Days of Exercise per Week: 0 days  . Minutes of Exercise  per Session: 0 min  Stress: No Stress Concern Present  . Feeling of Stress : Not at all  Social Connections: Moderately Integrated  . Frequency of Communication with Friends and Family: More than three times a week  . Frequency of Social Gatherings with Friends and Family: More than three times a week  . Attends Religious Services: More than 4 times per year  . Active Member of Clubs or Organizations: Yes  . Attends Archivist Meetings: More than 4 times per year  . Marital Status: Widowed  Intimate Partner Violence: Not At Risk  . Fear of Current or Ex-Partner: No  . Emotionally Abused: No  . Physically Abused: No  . Sexually Abused: No    FAMILY HISTORY:  Family History  Problem Relation Age of Onset  . Kidney disease Mother   . Kidney disease Sister   . Colon cancer Neg Hx   . Gastric cancer Neg Hx   . Esophageal cancer Neg Hx     CURRENT MEDICATIONS:  Current Outpatient Medications  Medication Sig Dispense Refill  . acetaminophen (TYLENOL) 325 MG tablet Take 2 tablets (650 mg total) by mouth every 6 (six) hours as needed for mild pain, fever or headache (or Fever >/= 101). 30 tablet 2  . albuterol (VENTOLIN HFA) 108 (90 Base) MCG/ACT inhaler Inhale 2 puffs into the lungs every 4 (four) hours as needed for wheezing or shortness of breath. 18 g 2  . epoetin alfa (PROCRIT) 96045 UNIT/ML injection Inject 1 mL (10,000 Units total) into the skin once a week. 1 mL 5  . predniSONE (DELTASONE) 10 MG tablet Take 10 mg by mouth daily with breakfast.    . tamsulosin (FLOMAX) 0.4 MG CAPS capsule Take 1 capsule (0.4 mg total) by mouth daily after supper. 30 capsule 2  . tiotropium (SPIRIVA HANDIHALER) 18 MCG inhalation capsule Place 1 capsule (18 mcg total) into inhaler and inhale daily. 30 capsule 12   No current facility-administered medications for this visit.    ALLERGIES:  No Known Allergies  PHYSICAL EXAM:  Performance status (ECOG): 1 - Symptomatic but completely  ambulatory  Vitals:   04/17/20 1348  BP: (!) 97/54  Pulse: 76  Resp: 16  SpO2: 98%   Wt Readings from Last 3 Encounters:  04/17/20 140 lb 9.6 oz (63.8 kg)  04/04/20 140 lb 6.4 oz (63.7 kg)  06/16/19 136 lb 11 oz (62 kg)   Physical Exam Vitals reviewed.  Constitutional:      Appearance: Normal appearance.  Cardiovascular:     Rate and Rhythm: Normal rate and regular rhythm.     Pulses: Normal pulses.     Heart sounds: Normal heart sounds.  Pulmonary:     Effort: Pulmonary effort is normal.     Breath sounds: Normal breath sounds.  Abdominal:     Palpations: Abdomen is soft. There is no hepatomegaly, splenomegaly or mass.     Tenderness: There is  no abdominal tenderness.     Hernia: No hernia is present.  Musculoskeletal:     Right lower leg: No edema.     Left lower leg: No edema.  Lymphadenopathy:     Upper Body:     Right upper body: No supraclavicular, axillary or pectoral adenopathy.     Left upper body: No supraclavicular, axillary or pectoral adenopathy.     Lower Body: No right inguinal adenopathy. No left inguinal adenopathy.  Neurological:     General: No focal deficit present.     Mental Status: He is alert and oriented to person, place, and time.  Psychiatric:        Mood and Affect: Mood normal.        Behavior: Behavior normal.     LABORATORY DATA:  I have reviewed the labs as listed.  CBC Latest Ref Rng & Units 04/04/2020 04/03/2020 06/21/2019  WBC 4.0 - 10.5 K/uL 3.8(L) 4.0 3.5(L)  Hemoglobin 13.0 - 17.0 g/dL 8.8(L) 6.6(LL) 8.6(L)  Hematocrit 39 - 52 % 28.7(L) 22.1(L) 26.3(L)  Platelets 150 - 400 K/uL 155 119(L) 78(L)   CMP Latest Ref Rng & Units 04/04/2020 04/03/2020 06/21/2019  Glucose 70 - 99 mg/dL 98 104(H) 93  BUN 8 - 23 mg/dL 40(H) 36(H) 27(H)  Creatinine 0.61 - 1.24 mg/dL 1.73(H) 1.87(H) 1.39(H)  Sodium 135 - 145 mmol/L 137 141 142  Potassium 3.5 - 5.1 mmol/L 3.3(L) 4.0 4.9  Chloride 98 - 111 mmol/L 104 108 105  CO2 22 - 32 mmol/L '24 24 27   ' Calcium 8.9 - 10.3 mg/dL 9.0 9.8 9.4  Total Protein 6.5 - 8.1 g/dL - 6.9 6.0(L)  Total Bilirubin 0.3 - 1.2 mg/dL - 0.9 1.1  Alkaline Phos 38 - 126 U/L - 56 84  AST 15 - 41 U/L - 12(L) 15  ALT 0 - 44 U/L - 10 15      Component Value Date/Time   RBC 3.26 (L) 04/04/2020 0457   MCV 88.0 04/04/2020 0457   MCH 27.0 04/04/2020 0457   MCHC 30.7 04/04/2020 0457   RDW 18.2 (H) 04/04/2020 0457   LYMPHSABS 0.3 (L) 04/03/2020 1028   MONOABS 0.2 04/03/2020 1028   EOSABS 0.0 04/03/2020 1028   BASOSABS 0.0 04/03/2020 1028    DIAGNOSTIC IMAGING:  I have independently reviewed the scans and discussed with the patient. CT Angio Chest PE W and/or Wo Contrast  Result Date: 04/03/2020 CLINICAL DATA:  Shortness of breath EXAM: CT ANGIOGRAPHY CHEST WITH CONTRAST TECHNIQUE: Multidetector CT imaging of the chest was performed using the standard protocol during bolus administration of intravenous contrast. Multiplanar CT image reconstructions and MIPs were obtained to evaluate the vascular anatomy. CONTRAST:  28m OMNIPAQUE IOHEXOL 350 MG/ML SOLN COMPARISON:  Chest radiograph April 03, 2020; CT angiogram chest June 16, 2019. FINDINGS: Cardiovascular: There is no demonstrable pulmonary embolus. There is no thoracic aortic aneurysm or dissection. There are foci calcification in visualized great vessels. There are foci of aortic atherosclerosis as well as multiple foci of coronary artery calcification. There is no pericardial effusion or pericardial thickening. Mediastinum/Nodes: Thyroid appears normal. No evident thoracic adenopathy. No esophageal lesions are appreciable. Lungs/Pleura: There is underlying centrilobular and paraseptal emphysematous change. There is stable apical scarring bilaterally. There is extensive fibrotic change in the lung bases with bibasilar atelectasis. There is chronic appearing atelectasis in each posterior segment upper lobe region. No appreciable airspace consolidation. No evident  pleural effusions. Upper Abdomen: There is upper abdominal aortic atherosclerosis. There is  an apparent cyst in the upper pole of the left kidney measuring 1.6 x 1.0 cm. Visualized upper abdominal structures otherwise appear unremarkable. Musculoskeletal: Patient is status post kyphoplasty at T11. Previous anterior wedge fractures at T6, T8, and T9 are stable with increased kyphosis. No lytic or destructive bone lesions are evident. No sclerotic lesions. No chest wall lesions. Review of the MIP images confirms the above findings. IMPRESSION: 1. No evident pulmonary embolus. No thoracic aortic aneurysm or dissection. There is aortic atherosclerosis as well as foci of great vessel and coronary artery calcification. 2. Underlying emphysematous change with fibrosis in the lung bases and a degree of bronchiectasis. Suspect usual interstitial pneumonitis superimposed on emphysema. Areas of atelectatic change in the posterior segment of each lower lobe is essentially stable. No frank airspace consolidation. No pleural effusions. 3.  No evident adenopathy. 4. Stable wedge fractures at T6, T8, T9, and T11, status post kyphoplasty at T11. Increase in kyphosis. Aortic Atherosclerosis (ICD10-I70.0) and Emphysema (ICD10-J43.9). Electronically Signed   By: Lowella Grip III M.D.   On: 04/03/2020 15:00   DG Chest Port 1 View  Result Date: 04/03/2020 CLINICAL DATA:  Weakness EXAM: PORTABLE CHEST 1 VIEW COMPARISON:  06/21/2019 FINDINGS: Chronic interstitial prominence. Superimposed patchy density at the lung bases. No pleural effusion. Portion of the right costophrenic angle is excluded. Stable cardiomediastinal contours with normal heart size. IMPRESSION: New patchy interstitial changes superimposed on chronic interstitial prominence. Could reflect atelectasis or atypical pneumonia in the appropriate setting. Electronically Signed   By: Macy Mis M.D.   On: 04/03/2020 11:03     ASSESSMENT:  1.  Normocytic  anemia: - Bone marrow biopsy on 12/08/2016 in [place], [place] showed normocellular marrow with trilineage hematopoiesis with erythroid predominance and dysmegakaryopoiesis.  Blasts were not increased.  Cytogenetics showed 46, XY,del(12)(p.2p13)[8]/46,XY[12].  FISH was positive for 12p-. - Consistent with myelodysplastic syndrome with single lineage dysplasia (MDS-SLD),IPSS-R score of 2, consistent with low risk category -He reports that he has been receiving monthly Procrit injections in [place]. -He missed Procrit injections for the last 3 months. -He was recently hospitalized on 04/03/2020 and was found to have hemoglobin 6.6 and received 2 units PRBC.   PLAN:  1.  Low risk MDS with anemia: -Reviewed his labs today.  Hemoglobin is 8.6.  White count is 1.4 which is new.  White count 2 weeks ago was 3.8.  Platelet count is also 86, previously 155 2 weeks ago. -Nutritional deficiency work-up including ferritin, U82 and folic acid was negative.  LDH was normal. -He denies any bleeding per rectum or melena. -He wants to switch over his injections to our clinic. -We will start him on Aranesp 500 mcg every 4 weeks. -If the insurance company does not approve, we will switch to Procrit/Retacrit 60,000 units every 4 weeks.  We will titrate up as needed. -If there is any worsening of leukopenia or thrombocytopenia will consider repeating bone marrow biopsy. -RTC 3 months with repeat anemia labs.  2.  CKD: -He has CKD with creatinine ranging from 1.5-1.9. -This is also contributing to his anemia.   Orders placed this encounter:  Orders Placed This Encounter  Procedures  . CBC with Differential/Platelet  . Ferritin  . Iron and TIBC  . Vitamin B12  . Folate  . Lactate dehydrogenase  . Reticulocytes  . Methylmalonic acid, serum  . Copper, serum  . Protein electrophoresis, serum     Derek Jack, MD [facility] [phone]   I, Milinda Antis,  am acting as a  scribe for Dr. Sanda Linger.  I, Derek Jack MD, have reviewed the above documentation for accuracy and completeness, and I agree with the above.

## 2020-04-17 NOTE — Patient Instructions (Signed)
[place] at [facility] Discharge Instructions  You were seen today by Dr. Delton Coombes.  He is a Engineer, civil (consulting).  He talked with you today about the reason you were referred to our clinic.     Thank you for choosing [place] at [facility] to provide your oncology and hematology care.  To afford each patient quality time with our provider, please arrive at least 15 minutes before your scheduled appointment time.   If you have a lab appointment with the [facility] please come in thru the Main Entrance and check in at the main information desk.  You need to re-schedule your appointment should you arrive 10 or more minutes late.  We strive to give you quality time with our providers, and arriving late affects you and other patients whose appointments are after yours.  Also, if you no show three or more times for appointments you may be dismissed from the clinic at the providers discretion.     Again, thank you for choosing [facility].  Our hope is that these requests will decrease the amount of time that you wait before being seen by our physicians.       _____________________________________________________________  Should you have questions after your visit to [facility], please contact our office at [phone] and follow the prompts.  Our office hours are 8:00 a.m. and 4:30 p.m. Monday - Friday.  Please note that voicemails left after 4:00 p.m. may not be returned until the following business day.  We are closed weekends and major holidays.  You do have access to a nurse 24-7, just call the main number to the clinic [phone] and do not press any options, hold on the line and a nurse will answer the phone.    For prescription refill requests, have your pharmacy contact our office and allow 72 hours.    Due to Covid, you will need to wear a mask upon entering the hospital. If you do not have a mask, a mask will be  given to you at the Main Entrance upon arrival. For doctor visits, patients may have 1 support person age 58 or older with them. For treatment visits, patients can not have anyone with them due to social distancing guidelines and our immunocompromised population.

## 2020-04-19 LAB — PROTEIN ELECTROPHORESIS, SERUM
A/G Ratio: 1.2 (ref 0.7–1.7)
Albumin ELP: 3.9 g/dL (ref 2.9–4.4)
Alpha-1-Globulin: 0.2 g/dL (ref 0.0–0.4)
Alpha-2-Globulin: 0.6 g/dL (ref 0.4–1.0)
Beta Globulin: 0.8 g/dL (ref 0.7–1.3)
Gamma Globulin: 1.6 g/dL (ref 0.4–1.8)
Globulin, Total: 3.2 g/dL (ref 2.2–3.9)
Total Protein ELP: 7.1 g/dL (ref 6.0–8.5)

## 2020-04-19 LAB — COPPER, SERUM: Copper: 115 ug/dL (ref 69–132)

## 2020-04-23 LAB — METHYLMALONIC ACID, SERUM: Methylmalonic Acid, Quantitative: 400 nmol/L — ABNORMAL HIGH (ref 0–378)

## 2020-04-24 ENCOUNTER — Inpatient Hospital Stay (HOSPITAL_COMMUNITY): Payer: Medicare Other

## 2020-04-24 ENCOUNTER — Other Ambulatory Visit: Payer: Self-pay

## 2020-04-24 VITALS — BP 102/60 | HR 73 | Temp 97.1°F | Resp 17

## 2020-04-24 DIAGNOSIS — N1831 Chronic kidney disease, stage 3a: Secondary | ICD-10-CM

## 2020-04-24 DIAGNOSIS — D469 Myelodysplastic syndrome, unspecified: Secondary | ICD-10-CM

## 2020-04-24 DIAGNOSIS — D649 Anemia, unspecified: Secondary | ICD-10-CM

## 2020-04-24 LAB — CBC
HCT: 28 % — ABNORMAL LOW (ref 39.0–52.0)
Hemoglobin: 8.6 g/dL — ABNORMAL LOW (ref 13.0–17.0)
MCH: 27.6 pg (ref 26.0–34.0)
MCHC: 30.7 g/dL (ref 30.0–36.0)
MCV: 89.7 fL (ref 80.0–100.0)
Platelets: 112 10*3/uL — ABNORMAL LOW (ref 150–400)
RBC: 3.12 MIL/uL — ABNORMAL LOW (ref 4.22–5.81)
RDW: 19.4 % — ABNORMAL HIGH (ref 11.5–15.5)
WBC: 5.3 10*3/uL (ref 4.0–10.5)
nRBC: 0 % (ref 0.0–0.2)

## 2020-04-24 MED ORDER — DARBEPOETIN ALFA 500 MCG/ML IJ SOSY
500.0000 ug | PREFILLED_SYRINGE | Freq: Once | INTRAMUSCULAR | Status: AC
  Administered 2020-04-24: 500 ug via SUBCUTANEOUS
  Filled 2020-04-24: qty 1

## 2020-04-24 NOTE — Progress Notes (Signed)
Patient tolerated Aranesp injection with no complaints voiced.  Site clean and dry with no bruising or swelling noted.  No complaints of pain.  Discharged with vital signs stable and no signs or symptoms of distress noted.   

## 2020-05-22 ENCOUNTER — Inpatient Hospital Stay (HOSPITAL_COMMUNITY): Payer: Medicare Other | Attending: Hematology

## 2020-05-22 ENCOUNTER — Inpatient Hospital Stay (HOSPITAL_COMMUNITY): Payer: Medicare Other

## 2020-05-22 ENCOUNTER — Other Ambulatory Visit: Payer: Self-pay

## 2020-05-22 VITALS — BP 113/57 | HR 84 | Temp 97.2°F | Resp 18

## 2020-05-22 DIAGNOSIS — D469 Myelodysplastic syndrome, unspecified: Secondary | ICD-10-CM

## 2020-05-22 DIAGNOSIS — Z79899 Other long term (current) drug therapy: Secondary | ICD-10-CM | POA: Diagnosis not present

## 2020-05-22 DIAGNOSIS — D649 Anemia, unspecified: Secondary | ICD-10-CM

## 2020-05-22 DIAGNOSIS — N1831 Chronic kidney disease, stage 3a: Secondary | ICD-10-CM

## 2020-05-22 LAB — CBC
HCT: 26 % — ABNORMAL LOW (ref 39.0–52.0)
Hemoglobin: 8 g/dL — ABNORMAL LOW (ref 13.0–17.0)
MCH: 26.5 pg (ref 26.0–34.0)
MCHC: 30.8 g/dL (ref 30.0–36.0)
MCV: 86.1 fL (ref 80.0–100.0)
Platelets: 243 10*3/uL (ref 150–400)
RBC: 3.02 MIL/uL — ABNORMAL LOW (ref 4.22–5.81)
RDW: 18.6 % — ABNORMAL HIGH (ref 11.5–15.5)
WBC: 4.1 10*3/uL (ref 4.0–10.5)
nRBC: 0.5 % — ABNORMAL HIGH (ref 0.0–0.2)

## 2020-05-22 MED ORDER — DARBEPOETIN ALFA 500 MCG/ML IJ SOSY
500.0000 ug | PREFILLED_SYRINGE | Freq: Once | INTRAMUSCULAR | Status: AC
  Administered 2020-05-22: 500 ug via SUBCUTANEOUS
  Filled 2020-05-22: qty 1

## 2020-05-22 NOTE — Progress Notes (Signed)
Patient tolerated Aranesp injection with no complaints voiced.  Site clean and dry with no bruising or swelling noted.  No complaints of pain.  Discharged with vital signs stable and no signs or symptoms of distress noted.   

## 2020-06-19 ENCOUNTER — Inpatient Hospital Stay (HOSPITAL_COMMUNITY): Payer: Medicare PPO

## 2020-06-19 ENCOUNTER — Encounter (HOSPITAL_COMMUNITY): Payer: Self-pay

## 2020-06-19 ENCOUNTER — Other Ambulatory Visit: Payer: Self-pay

## 2020-06-19 ENCOUNTER — Inpatient Hospital Stay (HOSPITAL_COMMUNITY): Payer: Medicare PPO | Attending: Hematology

## 2020-06-19 VITALS — BP 99/71 | HR 88 | Temp 97.0°F | Resp 22

## 2020-06-19 DIAGNOSIS — D469 Myelodysplastic syndrome, unspecified: Secondary | ICD-10-CM | POA: Diagnosis not present

## 2020-06-19 DIAGNOSIS — N1831 Chronic kidney disease, stage 3a: Secondary | ICD-10-CM

## 2020-06-19 DIAGNOSIS — D649 Anemia, unspecified: Secondary | ICD-10-CM

## 2020-06-19 LAB — CBC
HCT: 24.2 % — ABNORMAL LOW (ref 39.0–52.0)
Hemoglobin: 7.1 g/dL — ABNORMAL LOW (ref 13.0–17.0)
MCH: 25.4 pg — ABNORMAL LOW (ref 26.0–34.0)
MCHC: 29.3 g/dL — ABNORMAL LOW (ref 30.0–36.0)
MCV: 86.7 fL (ref 80.0–100.0)
Platelets: 108 10*3/uL — ABNORMAL LOW (ref 150–400)
RBC: 2.79 MIL/uL — ABNORMAL LOW (ref 4.22–5.81)
RDW: 18.8 % — ABNORMAL HIGH (ref 11.5–15.5)
WBC: 6.8 10*3/uL (ref 4.0–10.5)
nRBC: 0.4 % — ABNORMAL HIGH (ref 0.0–0.2)

## 2020-06-19 MED ORDER — DARBEPOETIN ALFA 500 MCG/ML IJ SOSY
500.0000 ug | PREFILLED_SYRINGE | Freq: Once | INTRAMUSCULAR | Status: AC
  Administered 2020-06-19: 500 ug via SUBCUTANEOUS
  Filled 2020-06-19: qty 1

## 2020-06-19 NOTE — Patient Instructions (Signed)
[facility] at [facility] Discharge Instructions  Received Aranesp injection today. Follow-up as scheduled   Thank you for choosing [facility] at [facility] to provide your oncology and hematology care.  To afford each patient quality time with our provider, please arrive at least 15 minutes before your scheduled appointment time.   If you have a lab appointment with the Cancer Center please come in thru the Main Entrance and check in at the main information desk.  You need to re-schedule your appointment should you arrive 10 or more minutes late.  We strive to give you quality time with our providers, and arriving late affects you and other patients whose appointments are after yours.  Also, if you no show three or more times for appointments you may be dismissed from the clinic at the providers discretion.     Again, thank you for choosing [facility].  Our hope is that these requests will decrease the amount of time that you wait before being seen by our physicians.       _____________________________________________________________  Should you have questions after your visit to [facility], please contact our office at [phone] and follow the prompts.  Our office hours are 8:00 a.m. and 4:30 p.m. Monday - Friday.  Please note that voicemails left after 4:00 p.m. may not be returned until the following business day.  We are closed weekends and major holidays.  You do have access to a nurse 24-7, just call the main number to the clinic [phone] and do not press any options, hold on the line and a nurse will answer the phone.    For prescription refill requests, have your pharmacy contact our office and allow 72 hours.    Due to Covid, you will need to wear a mask upon entering the hospital. If you do not have a mask, a mask will be given to you at the Main Entrance upon arrival. For doctor visits, patients may have  1 support person age 18 or older with them. For treatment visits, patients can not have anyone with them due to social distancing guidelines and our immunocompromised population.     

## 2020-06-19 NOTE — Progress Notes (Signed)
[name] tolerated Aranesp injection well without complaints or incident. Hgb 7.1 today which was reviewed with Dr. Delton Coombes and no blood orders obtained since pt is asymptomatic with his O2 on. VSS Pt discharged via wheelchair in satisfactory condition

## 2020-06-26 ENCOUNTER — Emergency Department (HOSPITAL_COMMUNITY): Payer: Medicare PPO

## 2020-06-26 ENCOUNTER — Emergency Department (HOSPITAL_COMMUNITY)
Admission: EM | Admit: 2020-06-26 | Discharge: 2020-06-27 | Disposition: A | Discharge status: Home / Self Care | Payer: Medicare PPO | Attending: Emergency Medicine | Admitting: Emergency Medicine

## 2020-06-26 ENCOUNTER — Other Ambulatory Visit: Payer: Self-pay

## 2020-06-26 ENCOUNTER — Encounter (HOSPITAL_COMMUNITY): Payer: Self-pay

## 2020-06-26 DIAGNOSIS — R0602 Shortness of breath: Secondary | ICD-10-CM | POA: Insufficient documentation

## 2020-06-26 DIAGNOSIS — D649 Anemia, unspecified: Secondary | ICD-10-CM | POA: Insufficient documentation

## 2020-06-26 DIAGNOSIS — R0902 Hypoxemia: Secondary | ICD-10-CM | POA: Diagnosis not present

## 2020-06-26 DIAGNOSIS — R531 Weakness: Secondary | ICD-10-CM | POA: Diagnosis not present

## 2020-06-26 DIAGNOSIS — Z85830 Personal history of malignant neoplasm of bone: Secondary | ICD-10-CM | POA: Diagnosis not present

## 2020-06-26 DIAGNOSIS — Z87891 Personal history of nicotine dependence: Secondary | ICD-10-CM | POA: Diagnosis not present

## 2020-06-26 DIAGNOSIS — Z20822 Contact with and (suspected) exposure to covid-19: Secondary | ICD-10-CM | POA: Diagnosis not present

## 2020-06-26 DIAGNOSIS — R55 Syncope and collapse: Secondary | ICD-10-CM | POA: Insufficient documentation

## 2020-06-26 DIAGNOSIS — I959 Hypotension, unspecified: Secondary | ICD-10-CM | POA: Diagnosis not present

## 2020-06-26 DIAGNOSIS — N1832 Chronic kidney disease, stage 3b: Secondary | ICD-10-CM | POA: Insufficient documentation

## 2020-06-26 DIAGNOSIS — J80 Acute respiratory distress syndrome: Secondary | ICD-10-CM | POA: Diagnosis not present

## 2020-06-26 DIAGNOSIS — J449 Chronic obstructive pulmonary disease, unspecified: Secondary | ICD-10-CM | POA: Insufficient documentation

## 2020-06-26 DIAGNOSIS — R42 Dizziness and giddiness: Secondary | ICD-10-CM | POA: Diagnosis not present

## 2020-06-26 LAB — TROPONIN I (HIGH SENSITIVITY)
Troponin I (High Sensitivity): 6 ng/L (ref <18)
Troponin I (High Sensitivity): 7 ng/L (ref <18)

## 2020-06-26 LAB — URINALYSIS, ROUTINE W REFLEX MICROSCOPIC
Bilirubin Urine: NEGATIVE
Glucose, UA: NEGATIVE mg/dL
Ketones, ur: NEGATIVE mg/dL
Nitrite: NEGATIVE
Protein, ur: NEGATIVE mg/dL
Specific Gravity, Urine: 1.026 (ref 1.005–1.030)
pH: 5 (ref 5.0–8.0)

## 2020-06-26 LAB — BLOOD GAS, VENOUS
Acid-base deficit: 1.5 mmol/L (ref 0.0–2.0)
Bicarbonate: 22.3 mmol/L (ref 20.0–28.0)
FIO2: 100
O2 Saturation: 33.5 %
Patient temperature: 37
pCO2, Ven: 45.6 mmHg (ref 44.0–60.0)
pH, Ven: 7.333 (ref 7.250–7.430)
pO2, Ven: <31 mmHg — CL (ref 32.0–45.0)

## 2020-06-26 LAB — BASIC METABOLIC PANEL
Anion gap: 8 (ref 5–15)
BUN: 35 mg/dL — ABNORMAL HIGH (ref 8–23)
CO2: 20 mmol/L — ABNORMAL LOW (ref 22–32)
Calcium: 9.5 mg/dL (ref 8.9–10.3)
Chloride: 107 mmol/L (ref 98–111)
Creatinine, Ser: 1.86 mg/dL — ABNORMAL HIGH (ref 0.61–1.24)
GFR, Estimated: 34 mL/min — ABNORMAL LOW (ref >60)
Glucose, Bld: 99 mg/dL (ref 70–99)
Potassium: 4.2 mmol/L (ref 3.5–5.1)
Sodium: 135 mmol/L (ref 135–145)

## 2020-06-26 LAB — IRON AND TIBC
Iron: 119 ug/dL (ref 45–182)
Saturation Ratios: 51 % — ABNORMAL HIGH (ref 17.9–39.5)
TIBC: 234 ug/dL — ABNORMAL LOW (ref 250–450)
UIBC: 115 ug/dL

## 2020-06-26 LAB — CBC
HCT: 24.7 % — ABNORMAL LOW (ref 39.0–52.0)
Hemoglobin: 7.1 g/dL — ABNORMAL LOW (ref 13.0–17.0)
MCH: 25.1 pg — ABNORMAL LOW (ref 26.0–34.0)
MCHC: 28.7 g/dL — ABNORMAL LOW (ref 30.0–36.0)
MCV: 87.3 fL (ref 80.0–100.0)
Platelets: 84 10*3/uL — ABNORMAL LOW (ref 150–400)
RBC: 2.83 MIL/uL — ABNORMAL LOW (ref 4.22–5.81)
RDW: 19.6 % — ABNORMAL HIGH (ref 11.5–15.5)
WBC: 3.7 10*3/uL — ABNORMAL LOW (ref 4.0–10.5)
nRBC: 1.1 % — ABNORMAL HIGH (ref 0.0–0.2)

## 2020-06-26 LAB — FERRITIN: Ferritin: 312 ng/mL (ref 24–336)

## 2020-06-26 LAB — POC SARS CORONAVIRUS 2 AG -  ED: SARS Coronavirus 2 Ag: NEGATIVE

## 2020-06-26 LAB — PREPARE RBC (CROSSMATCH)

## 2020-06-26 LAB — CBG MONITORING, ED: Glucose-Capillary: 80 mg/dL (ref 70–99)

## 2020-06-26 MED ORDER — IOHEXOL 350 MG/ML SOLN
100.0000 mL | Freq: Once | INTRAVENOUS | Status: AC | PRN
  Administered 2020-06-26: 75 mL via INTRAVENOUS

## 2020-06-26 MED ORDER — SODIUM CHLORIDE 0.9 % IV SOLN
10.0000 mL/h | Freq: Once | INTRAVENOUS | Status: AC
  Administered 2020-06-26: 10 mL/h via INTRAVENOUS

## 2020-06-26 MED ORDER — TAMSULOSIN HCL 0.4 MG PO CAPS
0.4000 mg | ORAL_CAPSULE | Freq: Every day | ORAL | Status: DC
  Administered 2020-06-27: 0.4 mg via ORAL
  Filled 2020-06-26: qty 1

## 2020-06-26 NOTE — ED Notes (Signed)
Date and time results received: 06/26/20 1533  (use smartphrase ".now" to insert current time)  Test:pO2 Critical Value: less than 31.0 Name of Provider Notified: Kem Parkinson PA Orders Received? Or Actions Taken?:NA

## 2020-06-26 NOTE — Discharge Instructions (Addendum)
You have been given a blood transfusion here.  Please call your oncologist, Dr. Delton Coombes tomorrow to arrange a follow-up appointment.  Return to the emergency department if you develop any worsening symptoms.

## 2020-06-26 NOTE — ED Triage Notes (Signed)
Pt brought to ED via [facility] for near syncope episode, states he got dizzy. Pt wears continuous O2 for COPD. RA sats 70-80's, end tidal 20's, placed on 15L sats increased to low 90's and end tidal to 30's. Pt on 3L on arrival to ED. Pt states he is not dizzy at this time but has floaters in front of both eyes.

## 2020-06-26 NOTE — ED Provider Notes (Signed)
[facility] EMERGENCY DEPARTMENT Provider Note   CSN: [id number] Arrival date & time: 06/26/20  1223     History Chief Complaint  Patient presents with  . Near Syncope    [name] is a 87 y.o. male.  HPI      [name] is a 87 y.o. male with history of CKD, COPD, myelodysplastic syndrome, anemia and bone cancer.  He is on 3 L continuous oxygen at home.  He presents to the Emergency Department complaining of shortness of breath and near syncopal episode shortly before ER arrival.  States that he woke this morning feeling short of breath.  Used his inhaler and oxygen improved.  States that he checked it at home it was in the 90s.  He switched off to his portable tank.  He was at rest around noon today when he developed a sudden episode of feeling very short of breath and dizzy and felt as though he was going to "pass out."  He sat down and symptoms improved gradually after 10 to 15 minutes.  EMS was contacted.  Upon arrival, states symptoms improved now states that he feels normal.  He denies dizziness, headache, chest pain, numbness or weakness of the face or extremities.  No known sick contacts or recent illness.  No fever or chills or cough.  Was seen by his oncologist on 06/19/2020, Hemoglobin was 7.1 received Aranesp injection.  Has appt with PCP, Dr. Hilma Favors next week.   Past Medical History:  Diagnosis Date  . Bone cancer ([place])   . Chronic kidney insufficiency   . COPD (chronic obstructive pulmonary disease) ([place])   . GERD (gastroesophageal reflux disease)   . Pulmonary nodule     Patient Active Problem List   Diagnosis Date Noted  . Symptomatic anemia 04/03/2020  . 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 04/03/2020  . Chronic respiratory failure with hypoxia /// 3L/min at baseline 04/03/2020  . Urinary frequency 02/26/2020  . Benign prostatic hyperplasia with urinary obstruction 02/26/2020   . Myelodysplastic syndrome ([place]) 06/16/2019  . HAP (hospital-acquired pneumonia) 06/16/2019  . CKD (chronic kidney disease), stage IIIB 06/16/2019  . Sepsis ([place]) 06/16/2019  . Acute exacerbation of chronic low back pain 06/06/2019  . Thrombocytopenia ([place]) 06/06/2019  . Intractable back pain 06/06/2019  . COPD (chronic obstructive pulmonary disease) ([place])   . Chronic kidney insufficiency   . Pulmonary nodule 05/19/2018  . Bronchiectasis ([place]) 05/19/2018  . Anemia 09/17/2017  . Abnormal CT of the abdomen 11/02/2016  . Loss of weight 11/02/2016  . History of colonic polyps 11/02/2016  . GERD (gastroesophageal reflux disease) 10/01/2015    Past Surgical History:  Procedure Laterality Date  . BACK SURGERY    . CATARACT EXTRACTION W/PHACO Right 03/09/2016   Procedure: CATARACT EXTRACTION PHACO AND INTRAOCULAR LENS PLACEMENT RIGHT EYE CDE=8.58;  Surgeon: Tonny Branch, MD;  Location: [facility];  Service: Ophthalmology;  Laterality: Right;  right  . CATARACT EXTRACTION W/PHACO Left 04/13/2016   Procedure: CATARACT EXTRACTION PHACO AND INTRAOCULAR LENS PLACEMENT (IOC);  Surgeon: Tonny Branch, MD;  Location: [facility];  Service: Ophthalmology;  Laterality: Left;  CDE: 8.08  . KYPHOPLASTY Bilateral 06/09/2019   Procedure: T11 KYPHOPLASTY;  Surgeon: Consuella Lose, MD;  Location: [place];  Service: Neurosurgery;  Laterality: Bilateral;  . None to Date  10/01/15       Family History  Problem Relation Age of Onset  . Kidney disease Mother   . Kidney disease  Sister   . Colon cancer Neg Hx   . Gastric cancer Neg Hx   . Esophageal cancer Neg Hx     Social History   Tobacco Use  . Smoking status: Former Smoker    Years: 15.00    Types: Cigarettes, Cigars    Quit date: 10/01/1990    Years since quitting: 29.7  . Smokeless tobacco: Never Used  . Tobacco comment: Quit x 25-30 years; 2 cigars daily when smoked  Vaping Use  . Vaping Use: Never used  Substance Use Topics  . Alcohol use: Not  Currently    Alcohol/week: 0.0 standard drinks  . Drug use: No    Home Medications Prior to Admission medications   Medication Sig Start Date End Date Taking? Authorizing Provider  acetaminophen (TYLENOL) 325 MG tablet Take 2 tablets (650 mg total) by mouth every 6 (six) hours as needed for mild pain, fever or headache (or Fever >/= 101). 04/04/20   Roxan Hockey, MD  albuterol (VENTOLIN HFA) 108 (90 Base) MCG/ACT inhaler Inhale 2 puffs into the lungs every 4 (four) hours as needed for wheezing or shortness of breath. 04/04/20   Roxan Hockey, MD  epoetin alfa (PROCRIT) 35465 UNIT/ML injection Inject 1 mL (10,000 Units total) into the skin once a week. 04/04/20   Roxan Hockey, MD  predniSONE (DELTASONE) 10 MG tablet Take 10 mg by mouth daily with breakfast.    [provider]  tamsulosin (FLOMAX) 0.4 MG CAPS capsule Take 1 capsule (0.4 mg total) by mouth daily after supper. 04/04/20   Roxan Hockey, MD  tiotropium (SPIRIVA HANDIHALER) 18 MCG inhalation capsule Place 1 capsule (18 mcg total) into inhaler and inhale daily. 04/04/20   Roxan Hockey, MD    Allergies    Patient has no known allergies.  Review of Systems   Review of Systems  Constitutional: Negative for chills, fatigue and fever.  HENT: Negative for sore throat and trouble swallowing.   Eyes: Negative for visual disturbance.  Respiratory: Negative for cough, shortness of breath and wheezing.   Cardiovascular: Negative for chest pain and palpitations.  Gastrointestinal: Negative for abdominal pain, nausea and vomiting.  Genitourinary: Negative for dysuria, flank pain and hematuria.  Musculoskeletal: Negative for arthralgias, back pain, myalgias, neck pain and neck stiffness.  Skin: Negative for rash.  Neurological: Positive for syncope (near syncope). Negative for dizziness, facial asymmetry, speech difficulty, weakness, numbness and headaches.  Hematological: Does not bruise/bleed easily.   Psychiatric/Behavioral: Negative for confusion and decreased concentration.    Physical Exam Updated Vital Signs BP 107/60   Pulse 66   Temp (!) 97.5 F (36.4 C) (Oral)   Resp 20   Ht 6\' 4"  (1.93 m)   Wt 63.5 kg   SpO2 100%   BMI 17.04 kg/m   Physical Exam Vitals and nursing note reviewed.  Constitutional:      Appearance: Normal appearance. He is not ill-appearing.  HENT:     Head: Atraumatic.     Mouth/Throat:     Mouth: Mucous membranes are moist.  Eyes:     Extraocular Movements: Extraocular movements intact.     Conjunctiva/sclera: Conjunctivae normal.     Pupils: Pupils are equal, round, and reactive to light.  Cardiovascular:     Rate and Rhythm: Normal rate and regular rhythm.     Pulses: Normal pulses.  Pulmonary:     Effort: Pulmonary effort is normal. No respiratory distress.     Breath sounds: No wheezing.  Chest:  Chest wall: No tenderness.  Abdominal:     General: There is no distension.     Palpations: Abdomen is soft.     Tenderness: There is no abdominal tenderness.  Musculoskeletal:        General: Normal range of motion.     Cervical back: Normal range of motion. No tenderness.     Right lower leg: No edema.     Left lower leg: No edema.  Lymphadenopathy:     Cervical: No cervical adenopathy.  Skin:    General: Skin is warm.     Capillary Refill: Capillary refill takes less than 2 seconds.     Findings: No rash.  Neurological:     Mental Status: He is alert.     GCS: GCS eye subscore is 4. GCS verbal subscore is 5. GCS motor subscore is 6.     Sensory: Sensation is intact.     Motor: Motor function is intact. No weakness or tremor.     Coordination: Coordination is intact.     Comments: CN II-XII intact.  Speech clear. No facial weakness.  No pronator drift, nml finger nose testing.       ED Results / Procedures / Treatments   Labs (all labs ordered are listed, but only abnormal results are displayed) Labs Reviewed  BASIC  METABOLIC PANEL - Abnormal; Notable for the following components:      Result Value   CO2 20 (*)    BUN 35 (*)    Creatinine, Ser 1.86 (*)    GFR, Estimated 34 (*)    All other components within normal limits  CBC - Abnormal; Notable for the following components:   WBC 3.7 (*)    RBC 2.83 (*)    Hemoglobin 7.1 (*)    HCT 24.7 (*)    MCH 25.1 (*)    MCHC 28.7 (*)    RDW 19.6 (*)    Platelets 84 (*)    nRBC 1.1 (*)    All other components within normal limits  URINALYSIS, ROUTINE W REFLEX MICROSCOPIC - Abnormal; Notable for the following components:   APPearance HAZY (*)    Hgb urine dipstick SMALL (*)    Leukocytes,Ua MODERATE (*)    Bacteria, UA RARE (*)    All other components within normal limits  BLOOD GAS, VENOUS - Abnormal; Notable for the following components:   pO2, Ven <31.0 (*)    All other components within normal limits  IRON AND TIBC - Abnormal; Notable for the following components:   TIBC 234 (*)    Saturation Ratios 51 (*)    All other components within normal limits  SARS CORONAVIRUS 2 (TAT 6-24 HRS)  FERRITIN  CBG MONITORING, ED  POC SARS CORONAVIRUS 2 AG -  ED  TYPE AND SCREEN  PREPARE RBC (CROSSMATCH)  TROPONIN I (HIGH SENSITIVITY)  TROPONIN I (HIGH SENSITIVITY)    EKG EKG Interpretation  Date/Time:  Wednesday June 26 2020 12:33:12 EST Ventricular Rate:  77 PR Interval:    QRS Duration: 78 QT Interval:  385 QTC Calculation: 436 R Axis:   82 Text Interpretation: Sinus rhythm Borderline right axis deviation Borderline ST depression, inferior leads No acute changes No significant change since last tracing Confirmed by Varney Biles [id number]) on 06/26/2020 5:11:39 PM   Radiology CT Angio Chest PE W and/or Wo Contrast  Result Date: 06/26/2020 CLINICAL DATA:  Dizziness, syncope, COPD EXAM: CT ANGIOGRAPHY CHEST WITH CONTRAST TECHNIQUE: Multidetector CT imaging of the chest  was performed using the standard protocol during bolus administration of  intravenous contrast. Multiplanar CT image reconstructions and MIPs were obtained to evaluate the vascular anatomy. CONTRAST:  73mL OMNIPAQUE IOHEXOL 350 MG/ML SOLN COMPARISON:  06/26/2020, 04/03/2020 FINDINGS: Cardiovascular: This is a technically adequate evaluation of the pulmonary vasculature. There are no filling defects or pulmonary emboli. The heart is not enlarged. No pericardial effusion. Extensive coronary artery atherosclerosis. Stable caliber of the thoracic aorta, with mild atherosclerosis of the aortic arch. Mediastinum/Nodes: No enlarged mediastinal, hilar, or axillary lymph nodes. Thyroid gland, trachea, and esophagus demonstrate no significant findings. Lungs/Pleura: Stable emphysema, with bibasilar scarring and fibrosis. No acute airspace disease, effusion, or pneumothorax. Central airways are patent. Upper Abdomen: No acute abnormality. Musculoskeletal: No acute or destructive bony lesions. Chronic wedge compression deformities in the mid and lower thoracic spine, with evidence of previous vertebral augmentation at T11 and L2. Review of the MIP images confirms the above findings. IMPRESSION: 1. No evidence of pulmonary embolus. 2. Stable bibasilar scarring and fibrosis. 3. Aortic Atherosclerosis (ICD10-I70.0) and Emphysema (ICD10-J43.9). Electronically Signed   By: Randa Ngo M.D.   On: 06/26/2020 16:56   DG Chest Portable 1 View  Result Date: 06/26/2020 CLINICAL DATA:  Near syncope EXAM: PORTABLE CHEST 1 VIEW COMPARISON:  04/03/2020 chest radiograph. FINDINGS: Stable cardiomediastinal silhouette with normal heart size. No pneumothorax. No pleural effusion. Patchy linear reticular opacities at both lung bases, unchanged. No pulmonary edema. No acute consolidative airspace disease. Vertebroplasty material overlies a lower thoracic vertebral compression fracture, chronic. IMPRESSION: 1. No acute cardiopulmonary disease. 2. Chronic nonspecific fibrosis at the lung bases. Electronically Signed    By: Ilona Sorrel M.D.   On: 06/26/2020 14:24    Procedures Procedures   Medications Ordered in ED Medications - No data to display  ED Course  I have reviewed the triage vital signs and the nursing notes.  Pertinent labs & imaging results that were available during my care of the patient were reviewed by me and considered in my medical decision making (see chart for details).  Clinical Course as of 06/26/20 1513  Wed Jun 26, 2020  1443 87 yo w/ pulm fibrosis on home O2 presenting with dypsnea at home and near syncope.  Lightheaded and SOB suddenly today, now fully recovered.  Back on home oxygen here.  Appears comfortable on exam, no hypoxia. [MT]    Clinical Course User Index [MT] Trifan, Carola Rhine, MD     CRITICAL CARE Performed by: Hutchinson Isenberg Total critical care time: 35  minutes Critical care time was exclusive of separately billable procedures and treating other patients. Critical care was necessary to treat or prevent imminent or life-threatening deterioration. Critical care was time spent personally by me on the following activities: development of treatment plan with patient and/or surrogate as well as nursing, discussions with consultants, evaluation of patient's response to treatment, examination of patient, obtaining history from patient or surrogate, ordering and performing treatments and interventions, ordering and review of laboratory studies, ordering and review of radiographic studies, pulse oximetry and re-evaluation of patient's condition.  MDM Rules/Calculators/A&P                           Pt with COPD, pulm fibrosis and myelodysplastic syndrome here after a episode of dyspnea and near syncope at rest.  Lasted approximately 15 minutes.  No chest pain, no focal neuro deficits.  No prior syncopal episodes.  Pt reporting back to his respiratory  baseline upon arrival.    Labs interpreted by me, leukopenic from previous labs 1 week ago, hemoglobin appears  baseline.  Covid antigen negative, PCR testing pending.  Clinical suspicion for Covid is low.  Patient has CKD, kidney functions baseline.  GFR greater than 30.  Given patient's symptoms, suspicious for PE.  We will proceed with CT angio of the chest.  Clinically, doubt ACS.  Patient's EKG without acute ischemic changes and troponin reassuring.  CT angio of the chest negative for PE.  Dyspnea and near syncope felt to be related to symptomatic anemia.    1500  Pt also seen by Dr. Langston Masker and care plan discussed.   1800  Consulted his oncologist, Dr. Delton Coombes and discussed findings.  He recommends to transfuse 1 unit and he will see pt in clinic for follow-up.  I Spoke with patient's daughter who provided additional history information.  States that her father's hemoglobin ~1 year ago was in the range of 9-10   2340 on recheck, patient resting comfortably.  Blood products currently being transfused, patient tolerating well.   Plan is discharge home once blood transfusion is completed.  Patient agrees to arrange follow-up with Dr. Delton Coombes.  Final Clinical Impression(s) / ED Diagnoses Final diagnoses:  Near syncope  Symptomatic anemia    Rx / DC Orders ED Discharge Orders    None       Kem Parkinson, PA-C 06/26/20 2357    Wyvonnia Dusky, MD 06/27/20 [phone]

## 2020-06-27 DIAGNOSIS — R55 Syncope and collapse: Secondary | ICD-10-CM | POA: Diagnosis not present

## 2020-06-27 LAB — BPAM RBC
Blood Product Expiration Date: 202203072359
ISSUE DATE / TIME: 202202092204
Unit Type and Rh: 9500

## 2020-06-27 LAB — TYPE AND SCREEN
ABO/RH(D): B NEG
Antibody Screen: NEGATIVE
Unit division: 0

## 2020-06-27 LAB — SARS CORONAVIRUS 2 (TAT 6-24 HRS): SARS Coronavirus 2: NEGATIVE

## 2020-06-27 NOTE — ED Notes (Signed)
Unit 1 of 1 PRBC infusion completed. VSS. Pt resting comfortably in bed, urinal and personal belongings at bedside. Pt has his personal 02 tank at bedside, remains on 3LNC. Has a ride for approx 6am. Denies further needs at this time.

## 2020-06-27 NOTE — ED Notes (Signed)
Pt blood infusing without difficulty. Provided food and drink, tolerating without complication, NAD noted. Urinal, call bell, and personal items within reach. On cardiac and pulse ox monitoring, will continue to monitor.

## 2020-06-28 ENCOUNTER — Other Ambulatory Visit (HOSPITAL_COMMUNITY): Payer: Self-pay | Admitting: Hematology

## 2020-07-02 DIAGNOSIS — Z681 Body mass index (BMI) 19 or less, adult: Secondary | ICD-10-CM | POA: Diagnosis not present

## 2020-07-02 DIAGNOSIS — D649 Anemia, unspecified: Secondary | ICD-10-CM | POA: Diagnosis not present

## 2020-07-02 DIAGNOSIS — J984 Other disorders of lung: Secondary | ICD-10-CM | POA: Diagnosis not present

## 2020-07-02 DIAGNOSIS — J449 Chronic obstructive pulmonary disease, unspecified: Secondary | ICD-10-CM | POA: Diagnosis not present

## 2020-07-02 DIAGNOSIS — N189 Chronic kidney disease, unspecified: Secondary | ICD-10-CM | POA: Diagnosis not present

## 2020-07-02 DIAGNOSIS — Z0001 Encounter for general adult medical examination with abnormal findings: Secondary | ICD-10-CM | POA: Diagnosis not present

## 2020-07-02 DIAGNOSIS — D508 Other iron deficiency anemias: Secondary | ICD-10-CM | POA: Diagnosis not present

## 2020-07-02 DIAGNOSIS — Z1331 Encounter for screening for depression: Secondary | ICD-10-CM | POA: Diagnosis not present

## 2020-07-02 DIAGNOSIS — E441 Mild protein-calorie malnutrition: Secondary | ICD-10-CM | POA: Diagnosis not present

## 2020-07-03 ENCOUNTER — Other Ambulatory Visit (HOSPITAL_COMMUNITY): Payer: Self-pay | Admitting: Hematology

## 2020-07-10 ENCOUNTER — Other Ambulatory Visit (HOSPITAL_COMMUNITY): Payer: Self-pay

## 2020-07-10 ENCOUNTER — Other Ambulatory Visit: Payer: Self-pay

## 2020-07-10 ENCOUNTER — Inpatient Hospital Stay (HOSPITAL_COMMUNITY): Payer: Medicare PPO

## 2020-07-10 ENCOUNTER — Inpatient Hospital Stay (HOSPITAL_BASED_OUTPATIENT_CLINIC_OR_DEPARTMENT_OTHER): Payer: Medicare PPO | Admitting: Hematology

## 2020-07-10 VITALS — BP 108/61 | HR 79 | Temp 97.3°F | Resp 20 | Wt 140.6 lb

## 2020-07-10 DIAGNOSIS — N1831 Chronic kidney disease, stage 3a: Secondary | ICD-10-CM

## 2020-07-10 DIAGNOSIS — D469 Myelodysplastic syndrome, unspecified: Secondary | ICD-10-CM

## 2020-07-10 DIAGNOSIS — J449 Chronic obstructive pulmonary disease, unspecified: Secondary | ICD-10-CM

## 2020-07-10 DIAGNOSIS — D649 Anemia, unspecified: Secondary | ICD-10-CM

## 2020-07-10 LAB — CBC WITH DIFFERENTIAL/PLATELET
Abs Immature Granulocytes: 0.06 10*3/uL (ref 0.00–0.07)
Basophils Absolute: 0 10*3/uL (ref 0.0–0.1)
Basophils Relative: 0 %
Eosinophils Absolute: 0 10*3/uL (ref 0.0–0.5)
Eosinophils Relative: 0 %
HCT: 27.1 % — ABNORMAL LOW (ref 39.0–52.0)
Hemoglobin: 8.2 g/dL — ABNORMAL LOW (ref 13.0–17.0)
Immature Granulocytes: 2 %
Lymphocytes Relative: 20 %
Lymphs Abs: 0.7 10*3/uL (ref 0.7–4.0)
MCH: 26.5 pg (ref 26.0–34.0)
MCHC: 30.3 g/dL (ref 30.0–36.0)
MCV: 87.7 fL (ref 80.0–100.0)
Monocytes Absolute: 0.2 10*3/uL (ref 0.1–1.0)
Monocytes Relative: 7 %
Neutro Abs: 2.5 10*3/uL (ref 1.7–7.7)
Neutrophils Relative %: 71 %
Platelets: 70 10*3/uL — ABNORMAL LOW (ref 150–400)
RBC: 3.09 MIL/uL — ABNORMAL LOW (ref 4.22–5.81)
RDW: 18.9 % — ABNORMAL HIGH (ref 11.5–15.5)
WBC: 3.5 10*3/uL — ABNORMAL LOW (ref 4.0–10.5)
nRBC: 0.6 % — ABNORMAL HIGH (ref 0.0–0.2)

## 2020-07-10 LAB — RETICULOCYTES
Immature Retic Fract: 48.3 % — ABNORMAL HIGH (ref 2.3–15.9)
RBC.: 3.03 MIL/uL — ABNORMAL LOW (ref 4.22–5.81)
Retic Count, Absolute: 19 10*3/uL (ref 19.0–186.0)
Retic Ct Pct: 0.6 % (ref 0.4–3.1)

## 2020-07-10 LAB — LACTATE DEHYDROGENASE: LDH: 131 U/L (ref 98–192)

## 2020-07-10 LAB — VITAMIN B12: Vitamin B-12: 2717 pg/mL — ABNORMAL HIGH (ref 180–914)

## 2020-07-10 LAB — IRON AND TIBC
Iron: 92 ug/dL (ref 45–182)
Saturation Ratios: 38 % (ref 17.9–39.5)
TIBC: 242 ug/dL — ABNORMAL LOW (ref 250–450)
UIBC: 150 ug/dL

## 2020-07-10 LAB — FOLATE: Folate: 16.7 ng/mL (ref >5.9)

## 2020-07-10 LAB — FERRITIN: Ferritin: 344 ng/mL — ABNORMAL HIGH (ref 24–336)

## 2020-07-10 MED ORDER — EPOETIN ALFA-EPBX 40000 UNIT/ML IJ SOLN
40000.0000 [IU] | Freq: Once | INTRAMUSCULAR | Status: AC
  Administered 2020-07-10: 40000 [IU] via SUBCUTANEOUS
  Filled 2020-07-10: qty 1

## 2020-07-10 MED ORDER — PREDNISONE 5 MG PO TABS: 5.0000 mg | ORAL_TABLET | Freq: Every day | ORAL | 0 refills | Status: DC

## 2020-07-10 MED ORDER — EPOETIN ALFA-EPBX 20000 UNIT/ML IJ SOLN
20000.0000 [IU] | Freq: Once | INTRAMUSCULAR | Status: AC
  Administered 2020-07-10: 20000 [IU] via SUBCUTANEOUS
  Filled 2020-07-10: qty 1

## 2020-07-10 NOTE — Progress Notes (Signed)
Patient presents today for Retacrit injection. Hemoglobin reviewed prior to administration. VSS. Injection tolerated without incident or complaint. See MAR for details. Patient discharged in satisfactory condition with follow up instructions. 

## 2020-07-10 NOTE — Progress Notes (Addendum)
[name], [name]   CLINIC:  Medical Oncology/Hematology  PCP:  Sharilyn Sites, [place] / [place] [address]  [phone]  REASON FOR VISIT:  Follow-up for MDS & anemia  PRIOR THERAPY: Blood transfusions  CURRENT THERAPY: Aranesp every 4 weeks.  INTERVAL HISTORY:  [name], a 87 y.o. male, returns for routine follow-up for his MDS and anemia. [name] was last seen on 04/17/2020 by Dr. Delton Coombes, and was started on a regimen of Aranesp 500 mcg every 4 weeks.  His last Aranesp dose was on 06/19/2020, Hgb on that date was 7.1.  On 06/26/2020, patient had ED visit at [facility] due to acute dyspnea and a near syncopal episode, once again hemoglobin was 7.1.  [name] was transfused 1 unit RBC, and instructed to follow-up with Dr. Delton Coombes.  Patient presents today for follow-up after ED visit as noted above.  Today [name] reports feeling better after RBC transfusion, and his hemoglobin has maintained at 8.2 as measured today.  His energy about 75% of normal, able to complete ADLs without difficulty.  Continues to have exertional dyspnea, but reports that this is his baseline due to his underlying COPD.  [name] denies any repeated episodes of near syncope or loss of consciousness, but does admit to some dizziness when [name] stands too quickly.  No recent falls reports good appetite, denies weight loss, fever, chills, night sweats, malaise.  No headache, chest pain, palpitations, nausea, vomiting, abdominal pain, extremity swelling.  Noted today to have platelets of 70, which has continued to drop (platelets 84 on 06/26/2020) [name] reports easy bruising, but denies excessive bleeding.  Denies epistaxis, hemoptysis, hematemesis, hematochezia, melena, hematuria.  Also noted to have persistent leukopenia with WBC 3.5 today.  Denies any recent infections, no current signs or symptoms of infectious systemic illness.  REVIEW OF SYSTEMS:  Review of  Systems  Constitutional: Positive for fatigue (Energy ~75% of normal). Negative for appetite change, chills, diaphoresis, fever and unexpected weight change.  HENT:  Negative.  Negative for nosebleeds.   Eyes: Negative.   Respiratory: Positive for shortness of breath (Chronic and at baseline). Negative for chest tightness, cough and wheezing.   Cardiovascular: Negative.   Gastrointestinal: Negative.   Genitourinary: Negative.    Musculoskeletal: Negative.   Skin:       Easy brusing  Neurological: Positive for dizziness (Postural dizziness when standing quickly). Negative for headaches, light-headedness and numbness.  Hematological: Negative for adenopathy. Bruises/bleeds easily.  Psychiatric/Behavioral: Negative for depression and sleep disturbance. The patient is not nervous/anxious.     PAST MEDICAL/SURGICAL HISTORY:  Past Medical History:  Diagnosis Date  . Bone cancer ([place])   . Chronic kidney insufficiency   . COPD (chronic obstructive pulmonary disease) ([place])   . GERD (gastroesophageal reflux disease)   . Pulmonary nodule    Past Surgical History:  Procedure Laterality Date  . BACK SURGERY    . CATARACT EXTRACTION W/PHACO Right 03/09/2016   Procedure: CATARACT EXTRACTION PHACO AND INTRAOCULAR LENS PLACEMENT RIGHT EYE CDE=8.58;  Surgeon: Tonny Branch, MD;  Location: [facility];  Service: Ophthalmology;  Laterality: Right;  right  . CATARACT EXTRACTION W/PHACO Left 04/13/2016   Procedure: CATARACT EXTRACTION PHACO AND INTRAOCULAR LENS PLACEMENT (IOC);  Surgeon: Tonny Branch, MD;  Location: [facility];  Service: Ophthalmology;  Laterality: Left;  CDE: 8.08  . KYPHOPLASTY Bilateral 06/09/2019   Procedure: T11 KYPHOPLASTY;  Surgeon: Consuella Lose, MD;  Location: [facility];  Service:  Neurosurgery;  Laterality: Bilateral;  . None to Date  10/01/15    SOCIAL HISTORY:  Social History   Socioeconomic History  . Marital status: Widowed    Spouse name: Not on file  . Number of children: Not on  file  . Years of education: Not on file  . Highest education level: Not on file  Occupational History  . Not on file  Tobacco Use  . Smoking status: Former Smoker    Years: 15.00    Types: Cigarettes, Cigars    Quit date: 10/01/1990    Years since quitting: 29.7  . Smokeless tobacco: Never Used  . Tobacco comment: Quit x 25-30 years; 2 cigars daily when smoked  Vaping Use  . Vaping Use: Never used  Substance and Sexual Activity  . Alcohol use: Not Currently    Alcohol/week: 0.0 standard drinks  . Drug use: No  . Sexual activity: Never    Birth control/protection: None  Other Topics Concern  . Not on file  Social History Narrative  . Not on file   Social Determinants of Health   Financial Resource Strain: High Risk  . Difficulty of Paying Living Expenses: Hard  Food Insecurity: No Food Insecurity  . Worried About Charity fundraiser in the Last Year: Never true  . Ran Out of Food in the Last Year: Never true  Transportation Needs: No Transportation Needs  . Lack of Transportation (Medical): No  . Lack of Transportation (Non-Medical): No  Physical Activity: Inactive  . Days of Exercise per Week: 0 days  . Minutes of Exercise per Session: 0 min  Stress: No Stress Concern Present  . Feeling of Stress : Not at all  Social Connections: Moderately Integrated  . Frequency of Communication with Friends and Family: More than three times a week  . Frequency of Social Gatherings with Friends and Family: More than three times a week  . Attends Religious Services: More than 4 times per year  . Active Member of Clubs or Organizations: Yes  . Attends Archivist Meetings: More than 4 times per year  . Marital Status: Widowed  Intimate Partner Violence: Not At Risk  . Fear of Current or Ex-Partner: No  . Emotionally Abused: No  . Physically Abused: No  . Sexually Abused: No    FAMILY HISTORY:  Family History  Problem Relation Age of Onset  . Kidney disease Mother   .  Kidney disease Sister   . Colon cancer Neg Hx   . Gastric cancer Neg Hx   . Esophageal cancer Neg Hx     CURRENT MEDICATIONS:  Current Outpatient Medications  Medication Sig Dispense Refill  . acetaminophen (TYLENOL) 325 MG tablet Take 2 tablets (650 mg total) by mouth every 6 (six) hours as needed for mild pain, fever or headache (or Fever >/= 101). 30 tablet 2  . albuterol (VENTOLIN HFA) 108 (90 Base) MCG/ACT inhaler Inhale 2 puffs into the lungs every 4 (four) hours as needed for wheezing or shortness of breath. 18 g 2  . epoetin alfa (PROCRIT) 66599 UNIT/ML injection Inject 1 mL (10,000 Units total) into the skin once a week. (Patient taking differently: Inject 10,000 Units into the skin every 30 (thirty) days.) 1 mL 5  . predniSONE (DELTASONE) 10 MG tablet Take 10 mg by mouth daily with breakfast.    . tamsulosin (FLOMAX) 0.4 MG CAPS capsule Take 1 capsule (0.4 mg total) by mouth daily after supper. 30 capsule 2  .  tiotropium (SPIRIVA HANDIHALER) 18 MCG inhalation capsule Place 1 capsule (18 mcg total) into inhaler and inhale daily. 30 capsule 12   No current facility-administered medications for this visit.    ALLERGIES:  No Known Allergies  PHYSICAL EXAM:  Performance status (ECOG): 1 - Symptomatic but completely ambulatory  Vitals:   07/10/20 1408  BP: 108/61  Pulse: 79  Resp: 20  Temp: (!) 97.3 F (36.3 C)  SpO2: 94%   Wt Readings from Last 3 Encounters:  07/10/20 140 lb 9.6 oz (63.8 kg)  06/26/20 140 lb (63.5 kg)  04/17/20 140 lb 9.6 oz (63.8 kg)   Physical Exam Constitutional:      Comments: Thin body habitus  HENT:     Head: Normocephalic and atraumatic.     Mouth/Throat:     Mouth: Mucous membranes are moist.  Eyes:     Extraocular Movements: Extraocular movements intact.     Conjunctiva/sclera: Conjunctivae normal.     Pupils: Pupils are equal, round, and reactive to light.  Cardiovascular:     Rate and Rhythm: Normal rate and regular rhythm.      Pulses: Normal pulses.  Pulmonary:     Effort: Pulmonary effort is normal.     Breath sounds: Normal breath sounds. No wheezing, rhonchi or rales.  Abdominal:     General: Abdomen is flat. Bowel sounds are normal.     Palpations: Abdomen is soft.  Musculoskeletal:        General: Normal range of motion.     Cervical back: Normal range of motion and neck supple.  Lymphadenopathy:     Head:     Right side of head: No submental, submandibular, preauricular, posterior auricular or occipital adenopathy.     Left side of head: No submental, submandibular, preauricular, posterior auricular or occipital adenopathy.     Cervical: No cervical adenopathy.     Right cervical: No superficial, deep or posterior cervical adenopathy.    Left cervical: No superficial, deep or posterior cervical adenopathy.  Skin:    General: Skin is warm and dry.  Neurological:     General: No focal deficit present.     Mental Status: [name] is alert and oriented to person, place, and time.  Psychiatric:        Mood and Affect: Mood normal.        Behavior: Behavior normal.     LABORATORY DATA:  I have reviewed the labs as listed.  CBC Latest Ref Rng & Units 07/10/2020 06/26/2020 06/19/2020  WBC 4.0 - 10.5 K/uL 3.5(L) 3.7(L) 6.8  Hemoglobin 13.0 - 17.0 g/dL 8.2(L) 7.1(L) 7.1(L)  Hematocrit 39.0 - 52.0 % 27.1(L) 24.7(L) 24.2(L)  Platelets 150 - 400 K/uL 70(L) 84(L) 108(L)   CMP Latest Ref Rng & Units 06/26/2020 04/04/2020 04/03/2020  Glucose 70 - 99 mg/dL 99 98 104(H)  BUN 8 - 23 mg/dL 35(H) 40(H) 36(H)  Creatinine 0.61 - 1.24 mg/dL 1.86(H) 1.73(H) 1.87(H)  Sodium 135 - 145 mmol/L 135 137 141  Potassium 3.5 - 5.1 mmol/L 4.2 3.3(L) 4.0  Chloride 98 - 111 mmol/L 107 104 108  CO2 22 - 32 mmol/L 20(L) 24 24  Calcium 8.9 - 10.3 mg/dL 9.5 9.0 9.8  Total Protein 6.5 - 8.1 g/dL - - 6.9  Total Bilirubin 0.3 - 1.2 mg/dL - - 0.9  Alkaline Phos 38 - 126 U/L - - 56  AST 15 - 41 U/L - - 12(L)  ALT 0 - 44 U/L - - 10  Component Value Date/Time   RBC 3.09 (L) 07/10/2020 1303   RBC 3.03 (L) 07/10/2020 1303   MCV 87.7 07/10/2020 1303   MCH 26.5 07/10/2020 1303   MCHC 30.3 07/10/2020 1303   RDW 18.9 (H) 07/10/2020 1303   LYMPHSABS 0.7 07/10/2020 1303   MONOABS 0.2 07/10/2020 1303   EOSABS 0.0 07/10/2020 1303   BASOSABS 0.0 07/10/2020 1303   Lab Results  Component Value Date   LDH 131 07/10/2020   LDH 125 04/17/2020   LDH 193 (H) 09/17/2017   Lab Results  Component Value Date   TIBC 234 (L) 06/26/2020   TIBC 250 04/17/2020   TIBC 171 (L) 05/20/2018   FERRITIN 312 06/26/2020   FERRITIN 310 04/17/2020   FERRITIN 373 (H) 05/20/2018   IRONPCTSAT 51 (H) 06/26/2020   IRONPCTSAT 71 (H) 04/17/2020   IRONPCTSAT 12 (L) 05/20/2018    DIAGNOSTIC IMAGING:  I have independently reviewed the scans and discussed with the patient. CT Angio Chest PE W and/or Wo Contrast  Result Date: 06/26/2020 CLINICAL DATA:  Dizziness, syncope, COPD EXAM: CT ANGIOGRAPHY CHEST WITH CONTRAST TECHNIQUE: Multidetector CT imaging of the chest was performed using the standard protocol during bolus administration of intravenous contrast. Multiplanar CT image reconstructions and MIPs were obtained to evaluate the vascular anatomy. CONTRAST:  52m OMNIPAQUE IOHEXOL 350 MG/ML SOLN COMPARISON:  06/26/2020, 04/03/2020 FINDINGS: Cardiovascular: This is a technically adequate evaluation of the pulmonary vasculature. There are no filling defects or pulmonary emboli. The heart is not enlarged. No pericardial effusion. Extensive coronary artery atherosclerosis. Stable caliber of the thoracic aorta, with mild atherosclerosis of the aortic arch. Mediastinum/Nodes: No enlarged mediastinal, hilar, or axillary lymph nodes. Thyroid gland, trachea, and esophagus demonstrate no significant findings. Lungs/Pleura: Stable emphysema, with bibasilar scarring and fibrosis. No acute airspace disease, effusion, or pneumothorax. Central airways are patent. Upper  Abdomen: No acute abnormality. Musculoskeletal: No acute or destructive bony lesions. Chronic wedge compression deformities in the mid and lower thoracic spine, with evidence of previous vertebral augmentation at T11 and L2. Review of the MIP images confirms the above findings. IMPRESSION: 1. No evidence of pulmonary embolus. 2. Stable bibasilar scarring and fibrosis. 3. Aortic Atherosclerosis (ICD10-I70.0) and Emphysema (ICD10-J43.9). Electronically Signed   By: MRanda NgoM.D.   On: 06/26/2020 16:56   DG Chest Portable 1 View  Result Date: 06/26/2020 CLINICAL DATA:  Near syncope EXAM: PORTABLE CHEST 1 VIEW COMPARISON:  04/03/2020 chest radiograph. FINDINGS: Stable cardiomediastinal silhouette with normal heart size. No pneumothorax. No pleural effusion. Patchy linear reticular opacities at both lung bases, unchanged. No pulmonary edema. No acute consolidative airspace disease. Vertebroplasty material overlies a lower thoracic vertebral compression fracture, chronic. IMPRESSION: 1. No acute cardiopulmonary disease. 2. Chronic nonspecific fibrosis at the lung bases. Electronically Signed   By: JIlona SorrelM.D.   On: 06/26/2020 14:24     ASSESSMENT:  1. Normocytic anemia: -[place] with trilineage hematopoiesis with erythroid predominance and dysmegakaryopoiesis.Blasts were not increased. Cytogenetics showed 46, XY,del(12)(p.2p13)[8]/46,XY[12]. FISH was positive for 12p-. 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 -ED visit 06/26/2020 due to presyncopal episode, hemoglobin 7.1, received unit  RBC   PLAN:  1.  Low risk MDS with pancytopenia: -I have reviewed his recent ER visit records. -Reviewed his labs today.  Hemoglobin is 8.2 (stable following blood transfusion 2 weeks ago), White count remains mildly depressed at 3.5.  Platelets trending down at 70 (platelets 84 two weeks ago).  -Ferritin was 344, percent saturation 38.  I78 and folic acid was normal. -[name] denies any bleeding per rectum or melena. -Increased frequency of Aranesp denied by insurance company - switched to Procrit/Retacrit 60,000 units weekly.  Will titrate as needed. -RTC in 1 month - if there is any worsening of leukopenia or thrombocytopenia, will consider repeating bone marrow biopsy.  Then [name] should be treated with hypomethylating agents.  2.  CKD: -[name] has CKD with creatinine ranging from 1.5-1.9. -This is also contributing to his anemia.  3.  Long-term use of corticosteroid  -Per patient report, has been taking 10 mg prednisone daily for the past 3 years, initially prescribed by his PCP in [place] -[name] is not sure why [name] is taking this, but reports that [name] ran out 2 days ago -We will prescribe 5 mg prednisone x30 days, in order to avoid steroid withdrawal -Instructed him to follow-up with his new PCP in [place] to determine if [name] needs to continue on prednisone, and for further prescriptions and refills  Orders placed this encounter:  Orders Placed This Encounter  Procedures  . CBC with Differential     Derek Jack, MD [facility] [phone]   I, Milinda Antis, am acting as a scribe for Dr. Sanda Linger.  I, Derek Jack MD, have reviewed the above documentation for accuracy and completeness, and I agree with the above.

## 2020-07-11 ENCOUNTER — Other Ambulatory Visit (HOSPITAL_COMMUNITY): Payer: Medicare Other

## 2020-07-12 LAB — COPPER, SERUM: Copper: 91 ug/dL (ref 69–132)

## 2020-07-17 ENCOUNTER — Inpatient Hospital Stay (HOSPITAL_COMMUNITY): Payer: Medicare Other | Attending: Hematology

## 2020-07-17 ENCOUNTER — Other Ambulatory Visit: Payer: Self-pay

## 2020-07-17 ENCOUNTER — Other Ambulatory Visit (HOSPITAL_COMMUNITY): Payer: Medicare Other

## 2020-07-17 ENCOUNTER — Ambulatory Visit (HOSPITAL_COMMUNITY): Payer: Medicare Other

## 2020-07-17 ENCOUNTER — Encounter (HOSPITAL_COMMUNITY): Payer: Self-pay

## 2020-07-17 ENCOUNTER — Ambulatory Visit (HOSPITAL_COMMUNITY): Payer: Medicare Other | Admitting: Hematology

## 2020-07-17 ENCOUNTER — Inpatient Hospital Stay (HOSPITAL_COMMUNITY): Payer: Medicare Other

## 2020-07-17 VITALS — BP 110/58 | HR 80 | Temp 96.8°F | Resp 24

## 2020-07-17 DIAGNOSIS — Z87891 Personal history of nicotine dependence: Secondary | ICD-10-CM | POA: Insufficient documentation

## 2020-07-17 DIAGNOSIS — Z7952 Long term (current) use of systemic steroids: Secondary | ICD-10-CM | POA: Insufficient documentation

## 2020-07-17 DIAGNOSIS — D649 Anemia, unspecified: Secondary | ICD-10-CM

## 2020-07-17 DIAGNOSIS — N189 Chronic kidney disease, unspecified: Secondary | ICD-10-CM | POA: Diagnosis not present

## 2020-07-17 DIAGNOSIS — D631 Anemia in chronic kidney disease: Secondary | ICD-10-CM | POA: Diagnosis not present

## 2020-07-17 DIAGNOSIS — D61818 Other pancytopenia: Secondary | ICD-10-CM | POA: Insufficient documentation

## 2020-07-17 DIAGNOSIS — D469 Myelodysplastic syndrome, unspecified: Secondary | ICD-10-CM | POA: Diagnosis not present

## 2020-07-17 DIAGNOSIS — Z79899 Other long term (current) drug therapy: Secondary | ICD-10-CM | POA: Diagnosis not present

## 2020-07-17 DIAGNOSIS — N1831 Chronic kidney disease, stage 3a: Secondary | ICD-10-CM

## 2020-07-17 LAB — CBC
HCT: 27 % — ABNORMAL LOW (ref 39.0–52.0)
Hemoglobin: 8 g/dL — ABNORMAL LOW (ref 13.0–17.0)
MCH: 26 pg (ref 26.0–34.0)
MCHC: 29.6 g/dL — ABNORMAL LOW (ref 30.0–36.0)
MCV: 87.7 fL (ref 80.0–100.0)
Platelets: 77 10*3/uL — ABNORMAL LOW (ref 150–400)
RBC: 3.08 MIL/uL — ABNORMAL LOW (ref 4.22–5.81)
RDW: 18.9 % — ABNORMAL HIGH (ref 11.5–15.5)
WBC: 4.5 10*3/uL (ref 4.0–10.5)
nRBC: 1.1 % — ABNORMAL HIGH (ref 0.0–0.2)

## 2020-07-17 LAB — SAMPLE TO BLOOD BANK

## 2020-07-17 MED ORDER — EPOETIN ALFA-EPBX 20000 UNIT/ML IJ SOLN
60000.0000 [IU] | Freq: Once | INTRAMUSCULAR | Status: AC
  Administered 2020-07-17: 60000 [IU] via SUBCUTANEOUS
  Filled 2020-07-17: qty 3

## 2020-07-17 NOTE — Progress Notes (Signed)
Patient presents today for Retacrit injection. Hemoglobin reviewed prior to administration. VSS. Injection tolerated without incident or complaint. See MAR for details. Patient discharged in satisfactory condition with follow up instructions. 

## 2020-07-18 ENCOUNTER — Ambulatory Visit (HOSPITAL_COMMUNITY): Payer: Medicare Other | Admitting: Hematology

## 2020-07-24 ENCOUNTER — Inpatient Hospital Stay (HOSPITAL_COMMUNITY): Payer: Medicare Other

## 2020-07-24 ENCOUNTER — Other Ambulatory Visit: Payer: Self-pay

## 2020-07-24 VITALS — BP 96/59 | HR 96 | Temp 97.8°F | Resp 17 | Wt 132.0 lb

## 2020-07-24 DIAGNOSIS — D469 Myelodysplastic syndrome, unspecified: Secondary | ICD-10-CM | POA: Diagnosis not present

## 2020-07-24 DIAGNOSIS — D649 Anemia, unspecified: Secondary | ICD-10-CM

## 2020-07-24 DIAGNOSIS — N1831 Chronic kidney disease, stage 3a: Secondary | ICD-10-CM

## 2020-07-24 LAB — CBC
HCT: 25.7 % — ABNORMAL LOW (ref 39.0–52.0)
Hemoglobin: 7.5 g/dL — ABNORMAL LOW (ref 13.0–17.0)
MCH: 25.4 pg — ABNORMAL LOW (ref 26.0–34.0)
MCHC: 29.2 g/dL — ABNORMAL LOW (ref 30.0–36.0)
MCV: 87.1 fL (ref 80.0–100.0)
Platelets: 77 10*3/uL — ABNORMAL LOW (ref 150–400)
RBC: 2.95 MIL/uL — ABNORMAL LOW (ref 4.22–5.81)
RDW: 19 % — ABNORMAL HIGH (ref 11.5–15.5)
WBC: 5.1 10*3/uL (ref 4.0–10.5)
nRBC: 0 % (ref 0.0–0.2)

## 2020-07-24 LAB — SAMPLE TO BLOOD BANK

## 2020-07-24 MED ORDER — EPOETIN ALFA-EPBX 20000 UNIT/ML IJ SOLN
60000.0000 [IU] | Freq: Once | INTRAMUSCULAR | Status: AC
  Administered 2020-07-24: 60000 [IU] via SUBCUTANEOUS

## 2020-07-24 MED ORDER — EPOETIN ALFA-EPBX 20000 UNIT/ML IJ SOLN
INTRAMUSCULAR | Status: AC
  Filled 2020-07-24: qty 1

## 2020-07-24 MED ORDER — EPOETIN ALFA-EPBX 40000 UNIT/ML IJ SOLN
INTRAMUSCULAR | Status: AC
  Filled 2020-07-24: qty 1

## 2020-07-24 NOTE — Patient Instructions (Signed)

## 2020-07-24 NOTE — Progress Notes (Addendum)
Patient present s for Retacrit injection.  Hgb 7.5.  Vital signs WNL.  No new complaints at this time.  No blood transfusion will recheck next week per Dr. Delton Coombes.  Retacrit injection given today per MD orders.  Stable during injection without adverse affects.  Injection site WNL.  Vital signs stable.  No complaints at this time.  Discharge from clinic ambulatory in stable condition.  Alert and oriented X 3.  Follow up with [facility] as scheduled.

## 2020-07-27 ENCOUNTER — Other Ambulatory Visit: Payer: Self-pay

## 2020-07-27 ENCOUNTER — Emergency Department (HOSPITAL_COMMUNITY)
Admission: EM | Admit: 2020-07-27 | Discharge: 2020-07-27 | Disposition: A | Discharge status: Home / Self Care | Payer: Medicare Other | Attending: Emergency Medicine | Admitting: Emergency Medicine

## 2020-07-27 ENCOUNTER — Emergency Department (HOSPITAL_COMMUNITY): Payer: Medicare Other

## 2020-07-27 DIAGNOSIS — J449 Chronic obstructive pulmonary disease, unspecified: Secondary | ICD-10-CM | POA: Diagnosis not present

## 2020-07-27 DIAGNOSIS — N189 Chronic kidney disease, unspecified: Secondary | ICD-10-CM | POA: Insufficient documentation

## 2020-07-27 DIAGNOSIS — Z85830 Personal history of malignant neoplasm of bone: Secondary | ICD-10-CM | POA: Diagnosis not present

## 2020-07-27 DIAGNOSIS — Z87891 Personal history of nicotine dependence: Secondary | ICD-10-CM | POA: Diagnosis not present

## 2020-07-27 DIAGNOSIS — Z7951 Long term (current) use of inhaled steroids: Secondary | ICD-10-CM | POA: Insufficient documentation

## 2020-07-27 DIAGNOSIS — Z20822 Contact with and (suspected) exposure to covid-19: Secondary | ICD-10-CM | POA: Diagnosis not present

## 2020-07-27 DIAGNOSIS — R531 Weakness: Secondary | ICD-10-CM | POA: Diagnosis not present

## 2020-07-27 DIAGNOSIS — R0602 Shortness of breath: Secondary | ICD-10-CM | POA: Insufficient documentation

## 2020-07-27 LAB — CBC
HCT: 26.8 % — ABNORMAL LOW (ref 39.0–52.0)
Hemoglobin: 7.9 g/dL — ABNORMAL LOW (ref 13.0–17.0)
MCH: 25.6 pg — ABNORMAL LOW (ref 26.0–34.0)
MCHC: 29.5 g/dL — ABNORMAL LOW (ref 30.0–36.0)
MCV: 87 fL (ref 80.0–100.0)
Platelets: 71 10*3/uL — ABNORMAL LOW (ref 150–400)
RBC: 3.08 MIL/uL — ABNORMAL LOW (ref 4.22–5.81)
RDW: 18.6 % — ABNORMAL HIGH (ref 11.5–15.5)
WBC: 5.4 10*3/uL (ref 4.0–10.5)
nRBC: 1.3 % — ABNORMAL HIGH (ref 0.0–0.2)

## 2020-07-27 LAB — BASIC METABOLIC PANEL
Anion gap: 9 (ref 5–15)
BUN: 39 mg/dL — ABNORMAL HIGH (ref 8–23)
CO2: 25 mmol/L (ref 22–32)
Calcium: 11.7 mg/dL — ABNORMAL HIGH (ref 8.9–10.3)
Chloride: 104 mmol/L (ref 98–111)
Creatinine, Ser: 2.43 mg/dL — ABNORMAL HIGH (ref 0.61–1.24)
GFR, Estimated: 25 mL/min — ABNORMAL LOW (ref >60)
Glucose, Bld: 105 mg/dL — ABNORMAL HIGH (ref 70–99)
Potassium: 4.2 mmol/L (ref 3.5–5.1)
Sodium: 138 mmol/L (ref 135–145)

## 2020-07-27 MED ORDER — PANTOPRAZOLE SODIUM 40 MG IV SOLR
40.0000 mg | Freq: Once | INTRAVENOUS | Status: AC
  Administered 2020-07-27: 40 mg via INTRAVENOUS
  Filled 2020-07-27: qty 40

## 2020-07-27 MED ORDER — SODIUM CHLORIDE 0.9 % IV BOLUS
500.0000 mL | Freq: Once | INTRAVENOUS | Status: AC
  Administered 2020-07-27: 500 mL via INTRAVENOUS

## 2020-07-27 MED ORDER — ALUM & MAG HYDROXIDE-SIMETH 200-200-20 MG/5ML PO SUSP
15.0000 mL | Freq: Once | ORAL | Status: AC
  Administered 2020-07-27: 15 mL via ORAL
  Filled 2020-07-27: qty 30

## 2020-07-27 NOTE — ED Notes (Signed)
Patients o2 was at 83% on room air. Patient placed on 3l of o2 and is at 100%

## 2020-07-27 NOTE — ED Provider Notes (Signed)
[facility] EMERGENCY DEPARTMENT Provider Note  CSN: [id number] Arrival date & time: 07/27/20 1458    History Chief Complaint  Patient presents with  . Shortness of Breath    HPI  [name] is a 87 y.o. male with history of pulmonary fibrosis, COPD, myelodysplasic syndrome followed by hem/onc for weekly Aranesp. He is on chronic home oxygen at 3L but does not always wear it at night due to discomfort while trying to sleep. He reports this afternoon he became suddenly more SOB and general weak. He drove to the ED without his oxygen and was noted to be hypoxic in triage. Improved with his usual 3L. He denies any cough or fever. He reports some chest discomfort with deep breath but no leg swelling. He admits to poor PO intake recently. He was in the ED about a month ago and was noted to have anemia worse than baseline and so he was given one unit of PRBC then. He is complaining of some reflux at the time of my evaluation but otherwise is feeling better.    Past Medical History:  Diagnosis Date  . Bone cancer ([place])   . Chronic kidney insufficiency   . COPD (chronic obstructive pulmonary disease) ([place])   . GERD (gastroesophageal reflux disease)   . Pulmonary nodule     Past Surgical History:  Procedure Laterality Date  . BACK SURGERY    . CATARACT EXTRACTION W/PHACO Right 03/09/2016   Procedure: CATARACT EXTRACTION PHACO AND INTRAOCULAR LENS PLACEMENT RIGHT EYE CDE=8.58;  Surgeon: Tonny Branch, MD;  Location: [facility];  Service: Ophthalmology;  Laterality: Right;  right  . CATARACT EXTRACTION W/PHACO Left 04/13/2016   Procedure: CATARACT EXTRACTION PHACO AND INTRAOCULAR LENS PLACEMENT (IOC);  Surgeon: Tonny Branch, MD;  Location: [facility];  Service: Ophthalmology;  Laterality: Left;  CDE: 8.08  . KYPHOPLASTY Bilateral 06/09/2019   Procedure: T11 KYPHOPLASTY;  Surgeon: Consuella Lose, MD;  Location: [place];  Service: Neurosurgery;  Laterality: Bilateral;  . None to Date  10/01/15     Family History  Problem Relation Age of Onset  . Kidney disease Mother   . Kidney disease Sister   . Colon cancer Neg Hx   . Gastric cancer Neg Hx   . Esophageal cancer Neg Hx     Social History   Tobacco Use  . Smoking status: Former Smoker    Years: 15.00    Types: Cigarettes, Cigars    Quit date: 10/01/1990    Years since quitting: 29.8  . Smokeless tobacco: Never Used  . Tobacco comment: Quit x 25-30 years; 2 cigars daily when smoked  Vaping Use  . Vaping Use: Never used  Substance Use Topics  . Alcohol use: Not Currently    Alcohol/week: 0.0 standard drinks  . Drug use: No     Home Medications Prior to Admission medications   Medication Sig Start Date End Date Taking? Authorizing Provider  acetaminophen (TYLENOL) 325 MG tablet Take 2 tablets (650 mg total) by mouth every 6 (six) hours as needed for mild pain, fever or headache (or Fever >/= 101). 04/04/20   Roxan Hockey, MD  albuterol (VENTOLIN HFA) 108 (90 Base) MCG/ACT inhaler Inhale 2 puffs into the lungs every 4 (four) hours as needed for wheezing or shortness of breath. 04/04/20   Roxan Hockey, MD  epoetin alfa (PROCRIT) 02585 UNIT/ML injection Inject 1 mL (10,000 Units total) into the skin once a week. Patient taking differently: Inject 10,000 Units into the skin every  30 (thirty) days. 04/04/20   Roxan Hockey, MD  predniSONE (DELTASONE) 5 MG tablet Take 1 tablet (5 mg total) by mouth daily with breakfast for 30 doses. 07/10/20 08/09/20  Harriett Rush, PA-C  tamsulosin (FLOMAX) 0.4 MG CAPS capsule Take 1 capsule (0.4 mg total) by mouth daily after supper. 04/04/20   Roxan Hockey, MD  tiotropium (SPIRIVA HANDIHALER) 18 MCG inhalation capsule Place 1 capsule (18 mcg total) into inhaler and inhale daily. 04/04/20   Roxan Hockey, MD     Allergies    Patient has no known allergies.   Review of Systems   Review of Systems A comprehensive review of systems was completed and negative  except as noted in HPI.    Physical Exam BP 133/76   Pulse 81   Temp 97.7 F (36.5 C) (Oral)   Resp 15   SpO2 99%   Physical Exam Vitals and nursing note reviewed.  Constitutional:      Appearance: Normal appearance.  HENT:     Head: Normocephalic and atraumatic.     Nose: Nose normal.     Mouth/Throat:     Mouth: Mucous membranes are moist.  Eyes:     Extraocular Movements: Extraocular movements intact.     Conjunctiva/sclera: Conjunctivae normal.  Cardiovascular:     Rate and Rhythm: Normal rate.  Pulmonary:     Effort: Pulmonary effort is normal.     Breath sounds: Decreased breath sounds present. No wheezing.  Abdominal:     General: Abdomen is flat.     Palpations: Abdomen is soft.     Tenderness: There is no abdominal tenderness.  Musculoskeletal:        General: No swelling. Normal range of motion.     Cervical back: Neck supple.  Skin:    General: Skin is warm and dry.  Neurological:     General: No focal deficit present.     Mental Status: He is alert.  Psychiatric:        Mood and Affect: Mood normal.      ED Results / Procedures / Treatments   Labs (all labs ordered are listed, but only abnormal results are displayed) Labs Reviewed  BASIC METABOLIC PANEL - Abnormal; Notable for the following components:      Result Value   Glucose, Bld 105 (*)    BUN 39 (*)    Creatinine, Ser 2.43 (*)    Calcium 11.7 (*)    GFR, Estimated 25 (*)    All other components within normal limits  CBC - Abnormal; Notable for the following components:   RBC 3.08 (*)    Hemoglobin 7.9 (*)    HCT 26.8 (*)    MCH 25.6 (*)    MCHC 29.5 (*)    RDW 18.6 (*)    Platelets 71 (*)    nRBC 1.3 (*)    All other components within normal limits  SARS CORONAVIRUS 2 (TAT 6-24 HRS)    EKG EKG Interpretation  Date/Time:  Saturday July 27 2020 15:12:28 EST Ventricular Rate:  82 PR Interval:  144 QRS Duration: 86 QT Interval:  358 QTC Calculation: 418 R Axis:   83 Text  Interpretation: Normal sinus rhythm Normal ECG No significant change since last tracing Confirmed by Calvert Cantor [phone]) on 07/27/2020 3:16:30 PM   Radiology DG Chest Port 1 View  Result Date: 07/27/2020 CLINICAL DATA:  Shortness of breath x3 days. EXAM: PORTABLE CHEST 1 VIEW COMPARISON:  June 26, 2020 FINDINGS: The  lungs are hyperinflated. Chronic appearing increased interstitial lung markings are seen bilaterally. Stable areas of scarring and/or atelectasis are noted within the bilateral lung bases. Stable biapical pleural thickening is present. There is no evidence of a pleural effusion or pneumothorax. The heart size and mediastinal contours are within normal limits. There is evidence of prior vertebroplasty within the lower thoracic spine. IMPRESSION: COPD with bibasilar scarring and/or atelectasis. Electronically Signed   By: Virgina Norfolk M.D.   On: 07/27/2020 15:51    Procedures Procedures  Medications Ordered in the ED Medications  sodium chloride 0.9 % bolus 500 mL (has no administration in time range)  pantoprazole (PROTONIX) injection 40 mg (has no administration in time range)  alum & mag hydroxide-simeth (MAALOX/MYLANTA) 200-200-20 MG/5ML suspension 15 mL (has no administration in time range)     MDM Rules/Calculators/A&P MDM Patient here with SOB and weakness, may be due to hypoxia from not always wearing his home oxygen and/or his chronic anemia which is about at baseline and improved from recent visit to Hematology clinic. He has improved symptoms with improved oxygen.  ED Course  I have reviewed the triage vital signs and the nursing notes.  Pertinent labs & imaging results that were available during my care of the patient were reviewed by me and considered in my medical decision making (see chart for details).  Clinical Course as of 07/27/20 2120  Sat Jul 27, 2020  1754 BMP with mild worsening of CKD, will give 500cc bolus and reassess.  [CS]  2117 Patient  reports he is feeling much better, back to baseline now and would like to go home. Recommend he continue with oral hydration at home. Wear O2 at all times and follow up with PCP for re-evaluation.  [CS]  2118 Spoke with patient and daughter over the phone, questions answered and all in agreement with plan for discharge. RTED for any other concerns.  [CS]    Clinical Course User Index [CS] Truddie Hidden, MD    Final Clinical Impression(s) / ED Diagnoses Final diagnoses:  SOB (shortness of breath)    Rx / DC Orders ED Discharge Orders    None       Truddie Hidden, MD 07/27/20 2120

## 2020-07-27 NOTE — ED Triage Notes (Addendum)
Pt to the ED with shortness of breath, chronically wears 3 L of oxygen at home, but did not bring it with him.  Pt has an oxygen saturation of 82 in triage on RA and is placed on 3 L [place]. Pt's oxygen saturation rose to 100 percent on 3L [place].  Pt c/o extreme fatigue.

## 2020-07-28 LAB — SARS CORONAVIRUS 2 (TAT 6-24 HRS): SARS Coronavirus 2: NEGATIVE

## 2020-07-31 ENCOUNTER — Encounter (HOSPITAL_COMMUNITY): Payer: Self-pay

## 2020-07-31 ENCOUNTER — Inpatient Hospital Stay (HOSPITAL_COMMUNITY): Payer: Medicare Other

## 2020-07-31 ENCOUNTER — Other Ambulatory Visit: Payer: Self-pay

## 2020-07-31 VITALS — BP 100/59 | HR 77 | Temp 97.2°F | Resp 18

## 2020-07-31 DIAGNOSIS — D469 Myelodysplastic syndrome, unspecified: Secondary | ICD-10-CM

## 2020-07-31 DIAGNOSIS — D649 Anemia, unspecified: Secondary | ICD-10-CM

## 2020-07-31 DIAGNOSIS — N1831 Chronic kidney disease, stage 3a: Secondary | ICD-10-CM

## 2020-07-31 LAB — CBC
HCT: 24.5 % — ABNORMAL LOW (ref 39.0–52.0)
Hemoglobin: 7.2 g/dL — ABNORMAL LOW (ref 13.0–17.0)
MCH: 25.7 pg — ABNORMAL LOW (ref 26.0–34.0)
MCHC: 29.4 g/dL — ABNORMAL LOW (ref 30.0–36.0)
MCV: 87.5 fL (ref 80.0–100.0)
Platelets: 66 10*3/uL — ABNORMAL LOW (ref 150–400)
RBC: 2.8 MIL/uL — ABNORMAL LOW (ref 4.22–5.81)
RDW: 18.7 % — ABNORMAL HIGH (ref 11.5–15.5)
WBC: 3.6 10*3/uL — ABNORMAL LOW (ref 4.0–10.5)
nRBC: 0.6 % — ABNORMAL HIGH (ref 0.0–0.2)

## 2020-07-31 LAB — SAMPLE TO BLOOD BANK

## 2020-07-31 MED ORDER — EPOETIN ALFA-EPBX 40000 UNIT/ML IJ SOLN
INTRAMUSCULAR | Status: AC
  Filled 2020-07-31: qty 1

## 2020-07-31 MED ORDER — EPOETIN ALFA-EPBX 20000 UNIT/ML IJ SOLN
INTRAMUSCULAR | Status: AC
  Filled 2020-07-31: qty 1

## 2020-07-31 MED ORDER — EPOETIN ALFA-EPBX 20000 UNIT/ML IJ SOLN
60000.0000 [IU] | Freq: Once | INTRAMUSCULAR | Status: AC
  Administered 2020-07-31: 60000 [IU] via SUBCUTANEOUS
  Filled 2020-07-31: qty 3

## 2020-07-31 NOTE — Progress Notes (Signed)
[name] presents today for injection per the provider's orders.  Retacrit administration without incident; injection site WNL; see MAR for injection details.  Patient tolerated procedure well and without incident.  No questions or complaints noted at this time.  Discharged via wheelchair in stable condition.

## 2020-08-06 ENCOUNTER — Inpatient Hospital Stay (HOSPITAL_COMMUNITY): Payer: Medicare Other

## 2020-08-07 ENCOUNTER — Ambulatory Visit (HOSPITAL_COMMUNITY): Payer: Medicare Other | Admitting: Physician Assistant

## 2020-08-07 ENCOUNTER — Inpatient Hospital Stay (HOSPITAL_COMMUNITY): Payer: Medicare Other

## 2020-08-07 ENCOUNTER — Inpatient Hospital Stay (HOSPITAL_BASED_OUTPATIENT_CLINIC_OR_DEPARTMENT_OTHER): Payer: Medicare Other | Admitting: Physician Assistant

## 2020-08-07 ENCOUNTER — Other Ambulatory Visit: Payer: Self-pay

## 2020-08-07 VITALS — BP 99/49 | HR 84 | Temp 97.2°F | Resp 20 | Wt 134.0 lb

## 2020-08-07 DIAGNOSIS — D469 Myelodysplastic syndrome, unspecified: Secondary | ICD-10-CM | POA: Diagnosis not present

## 2020-08-07 DIAGNOSIS — D649 Anemia, unspecified: Secondary | ICD-10-CM

## 2020-08-07 DIAGNOSIS — N1831 Chronic kidney disease, stage 3a: Secondary | ICD-10-CM

## 2020-08-07 LAB — CBC WITH DIFFERENTIAL/PLATELET
Abs Immature Granulocytes: 0.05 10*3/uL (ref 0.00–0.07)
Basophils Absolute: 0 10*3/uL (ref 0.0–0.1)
Basophils Relative: 0 %
Eosinophils Absolute: 0 10*3/uL (ref 0.0–0.5)
Eosinophils Relative: 0 %
HCT: 23.8 % — ABNORMAL LOW (ref 39.0–52.0)
Hemoglobin: 7.1 g/dL — ABNORMAL LOW (ref 13.0–17.0)
Immature Granulocytes: 1 %
Lymphocytes Relative: 17 %
Lymphs Abs: 0.8 10*3/uL (ref 0.7–4.0)
MCH: 25.7 pg — ABNORMAL LOW (ref 26.0–34.0)
MCHC: 29.8 g/dL — ABNORMAL LOW (ref 30.0–36.0)
MCV: 86.2 fL (ref 80.0–100.0)
Monocytes Absolute: 0.2 10*3/uL (ref 0.1–1.0)
Monocytes Relative: 5 %
Neutro Abs: 3.4 10*3/uL (ref 1.7–7.7)
Neutrophils Relative %: 77 %
Platelets: 90 10*3/uL — ABNORMAL LOW (ref 150–400)
RBC: 2.76 MIL/uL — ABNORMAL LOW (ref 4.22–5.81)
RDW: 18.1 % — ABNORMAL HIGH (ref 11.5–15.5)
WBC: 4.5 10*3/uL (ref 4.0–10.5)
nRBC: 0.9 % — ABNORMAL HIGH (ref 0.0–0.2)

## 2020-08-07 LAB — PREPARE RBC (CROSSMATCH)

## 2020-08-07 MED ORDER — EPOETIN ALFA-EPBX 20000 UNIT/ML IJ SOLN
60000.0000 [IU] | Freq: Once | INTRAMUSCULAR | Status: AC
  Administered 2020-08-07: 60000 [IU] via SUBCUTANEOUS
  Filled 2020-08-07: qty 3

## 2020-08-07 NOTE — Patient Instructions (Signed)

## 2020-08-07 NOTE — Progress Notes (Signed)
Patient presents for Retacrit injection.  Vital signs WNL.  Hgb noted to be 7.1.  Patient has weakness that is unchanged since last visit.  No dizziness or increased shortness of breath.    Retacrit injection given today per MD orders.  Stable during injection without adverse affects.  Injection site WNL.  Vital signs stable.  No complaints at this time.  Patient schedule for 1 UPRBC on 08/09/20.  Discharge from clinic via wheelchair in stable condition.  Alert and oriented X 3.  Follow up with [facility] as scheduled.

## 2020-08-07 NOTE — Patient Instructions (Addendum)
[place] [facility] Discharge Instructions  You were seen today by Tarri Abernethy PA-C for your myelodysplastic syndrome.    **We will schedule you for a blood transfusion later this week.  LABS: Continue weekly labs to check blood counts.  Check for stool in blood - we will send you home with supplies, and you will bring them back to the lab here at the hospital.  OTHER TESTS: Bone marrow biopsy  MEDICATIONS: Continue your weekly appointments for Retacrit.  FOLLOW-UP APPOINTMENT: Return in 4 weeks for follow-up appointment with Tarri Abernethy, PA-C.  Thank you for choosing [facility] to provide your oncology and hematology care.  To afford each patient quality time with our provider, please arrive at least 15 minutes before your scheduled appointment time.   If you have a lab appointment with the [facility] please come in thru the Main Entrance and check in at the main information desk.  You need to re-schedule your appointment should you arrive 10 or more minutes late.  We strive to give you quality time with our providers, and arriving late affects you and other patients whose appointments are after yours.  Also, if you no show three or more times for appointments you may be dismissed from the clinic at the providers discretion.     Again, thank you for choosing [facility].  Our hope is that these requests will decrease the amount of time that you wait before being seen by our physicians.       _____________________________________________________________  Should you have questions after your visit to [facility], please contact our office at [phone] and follow the prompts.  Our office hours are 8:00 a.m. and 4:30 p.m. Monday - Friday.  Please note that voicemails left after 4:00 p.m. may not be returned until the following business day.  We are closed weekends and major holidays.   You do have access to a nurse 24-7, just call the main number to the clinic [phone] and do not press any options, hold on the line and a nurse will answer the phone.    For prescription refill requests, have your pharmacy contact our office and allow 72 hours.    Due to Covid, you will need to wear a mask upon entering the hospital. If you do not have a mask, a mask will be given to you at the Main Entrance upon arrival. For doctor visits, patients may have 1 support person age 67 or older with them. For treatment visits, patients can not have anyone with them due to social distancing guidelines and our immunocompromised population.

## 2020-08-07 NOTE — Progress Notes (Signed)
[name], [place] [address]   CLINIC:  Medical Oncology/Hematology  PCP:  Sharilyn Sites, [place] [address] [phone]   REASON FOR VISIT:  Follow-up for myelodysplastic syndrome  PRIOR THERAPY: RBC transfusions, monthly Aranesp  CURRENT THERAPY: Weekly Retacrit  INTERVAL HISTORY:  [name] 87 y.o. male returns for routine follow-up of MDS and anemia.  He was last seen in the hematology clinic on 07/10/2020 by Dr. Delton Coombes / Tarri Abernethy PA-C.  At that time, the patient was started on an increased regimen of ESA, and has been receiving weekly Retacrit for the past month, but no improvement in hemoglobin.  His hemoglobin at the time of his last visit on 07/10/2020 was 8.2 (following RBC transfusion on 06/26/2020), but it has slowly dropped and was most recently measured at 7.1 on 08/07/2020.  Since his last visit, the patient reports feeling severely fatigued.  He cannot stand for more than a few minutes at a time or walk significant distances.  He is able to complete his ADLs at home, but requires frequent breaks to rest.  Reports energy level about 25% of baseline.  He reports that he has no appetite, but tries to force himself to eat.  He does drink 2 Boost supplemental drinks each day.  He has lost 6 pounds since his last visit a month ago.  He has baseline dyspnea secondary to his underlying COPD, and wears 3 L of oxygen at night and as needed.  He has noticed that he is getting more short of breath than usual over the past month.  He denies exertional chest pain, but reports a tight chest discomfort when taking a deep breath when he lays flat.  He continues to have intermittent episodes of near syncope.  He has had 2 of these episodes over the past month.  He denies fever, chills, night sweats, malaise.    No headaches, palpitations, nausea, vomiting, abdominal pain, extremity swelling.  Noted to have  platelets of 90 on most recent CBC from 08/07/2020.  He reports easy bruising, but denies excessive bleeding.  Denies epistaxis, hemoptysis, hematemesis, hematochezia, melena, hematuria.  Also noted to have intermittent leukopenia (currently normal with WBC 4.5 on 08/07/2020).  Denies any recent infections, no current signs or symptoms of infectious systemic illness.   REVIEW OF SYSTEMS:  Review of Systems  Constitutional: Positive for appetite change and fatigue. Negative for chills, diaphoresis, fever and unexpected weight change.  HENT:   Negative for lump/mass and nosebleeds.   Eyes: Negative for eye problems.  Respiratory: Positive for chest tightness and shortness of breath. Negative for cough and hemoptysis.   Cardiovascular: Negative for chest pain, leg swelling and palpitations.  Gastrointestinal: Negative for abdominal pain, blood in stool, constipation, diarrhea, nausea and vomiting.  Genitourinary: Negative for hematuria.   Skin: Negative.   Neurological: Positive for light-headedness. Negative for dizziness and headaches.  Hematological: Bruises/bleeds easily.      PAST MEDICAL/SURGICAL HISTORY:  Past Medical History:  Diagnosis Date  . Bone cancer ([facility])   . Chronic kidney insufficiency   . COPD (chronic obstructive pulmonary disease) ([facility])   . GERD (gastroesophageal reflux disease)   . Pulmonary nodule    Past Surgical History:  Procedure Laterality Date  . BACK SURGERY    . CATARACT EXTRACTION W/PHACO Right 03/09/2016   Procedure: CATARACT EXTRACTION PHACO AND INTRAOCULAR LENS PLACEMENT RIGHT EYE CDE=8.58;  Surgeon: Tonny Branch, MD;  Location: [facility];  Service: Ophthalmology;  Laterality: Right;  right  . CATARACT EXTRACTION W/PHACO Left 04/13/2016   Procedure: CATARACT EXTRACTION PHACO AND INTRAOCULAR LENS PLACEMENT (IOC);  Surgeon: Tonny Branch, MD;  Location: [facility];  Service: Ophthalmology;  Laterality: Left;  CDE: 8.08  . KYPHOPLASTY Bilateral 06/09/2019    Procedure: T11 KYPHOPLASTY;  Surgeon: Consuella Lose, MD;  Location: [facility];  Service: Neurosurgery;  Laterality: Bilateral;  . None to Date  10/01/15     SOCIAL HISTORY:  Social History   Socioeconomic History  . Marital status: Widowed    Spouse name: Not on file  . Number of children: Not on file  . Years of education: Not on file  . Highest education level: Not on file  Occupational History  . Not on file  Tobacco Use  . Smoking status: Former Smoker    Years: 15.00    Types: Cigarettes, Cigars    Quit date: 10/01/1990    Years since quitting: 29.8  . Smokeless tobacco: Never Used  . Tobacco comment: Quit x 25-30 years; 2 cigars daily when smoked  Vaping Use  . Vaping Use: Never used  Substance and Sexual Activity  . Alcohol use: Not Currently    Alcohol/week: 0.0 standard drinks  . Drug use: No  . Sexual activity: Never    Birth control/protection: None  Other Topics Concern  . Not on file  Social History Narrative  . Not on file   Social Determinants of Health   Financial Resource Strain: High Risk  . Difficulty of Paying Living Expenses: Hard  Food Insecurity: No Food Insecurity  . Worried About Charity fundraiser in the Last Year: Never true  . Ran Out of Food in the Last Year: Never true  Transportation Needs: No Transportation Needs  . Lack of Transportation (Medical): No  . Lack of Transportation (Non-Medical): No  Physical Activity: Inactive  . Days of Exercise per Week: 0 days  . Minutes of Exercise per Session: 0 min  Stress: No Stress Concern Present  . Feeling of Stress : Not at all  Social Connections: Moderately Integrated  . Frequency of Communication with Friends and Family: More than three times a week  . Frequency of Social Gatherings with Friends and Family: More than three times a week  . Attends Religious Services: More than 4 times per year  . Active Member of Clubs or Organizations: Yes  . Attends Archivist Meetings:  More than 4 times per year  . Marital Status: Widowed  Intimate Partner Violence: Not At Risk  . Fear of Current or Ex-Partner: No  . Emotionally Abused: No  . Physically Abused: No  . Sexually Abused: No    FAMILY HISTORY:  Family History  Problem Relation Age of Onset  . Kidney disease Mother   . Kidney disease Sister   . Colon cancer Neg Hx   . Gastric cancer Neg Hx   . Esophageal cancer Neg Hx     CURRENT MEDICATIONS:  Outpatient Encounter Medications as of 08/07/2020  Medication Sig  . acetaminophen (TYLENOL) 325 MG tablet Take 2 tablets (650 mg total) by mouth every 6 (six) hours as needed for mild pain, fever or headache (or Fever >/= 101).  Marland Kitchen albuterol (VENTOLIN HFA) 108 (90 Base) MCG/ACT inhaler Inhale 2 puffs into the lungs every 4 (four) hours as needed for wheezing or shortness of breath.  Marland Kitchen epoetin alfa (PROCRIT) 70017 UNIT/ML injection Inject 1 mL (10,000 Units total) into the skin  once a week. (Patient taking differently: Inject 10,000 Units into the skin every 30 (thirty) days.)  . pantoprazole (PROTONIX) 40 MG tablet Take 40 mg by mouth daily.  . predniSONE (DELTASONE) 5 MG tablet Take 1 tablet (5 mg total) by mouth daily with breakfast for 30 doses.  . tamsulosin (FLOMAX) 0.4 MG CAPS capsule Take 1 capsule (0.4 mg total) by mouth daily after supper.  . tiotropium (SPIRIVA HANDIHALER) 18 MCG inhalation capsule Place 1 capsule (18 mcg total) into inhaler and inhale daily.   No facility-administered encounter medications on file as of 08/07/2020.    ALLERGIES:  No Known Allergies   PHYSICAL EXAM:  ECOG PERFORMANCE STATUS: 2 - Symptomatic, <50% confined to bed  There were no vitals filed for this visit. There were no vitals filed for this visit. Physical Exam Constitutional:      Appearance: Normal appearance. He is underweight.  HENT:     Head: Normocephalic and atraumatic.     Mouth/Throat:     Mouth: Mucous membranes are moist.  Eyes:     Extraocular  Movements: Extraocular movements intact.     Pupils: Pupils are equal, round, and reactive to light.  Cardiovascular:     Rate and Rhythm: Normal rate and regular rhythm.     Pulses: Normal pulses.     Heart sounds: Normal heart sounds.  Pulmonary:     Effort: Pulmonary effort is normal.     Breath sounds: Normal breath sounds.  Abdominal:     General: Bowel sounds are normal.     Palpations: Abdomen is soft.     Tenderness: There is no abdominal tenderness.  Musculoskeletal:        General: No swelling.     Right lower leg: No edema.     Left lower leg: No edema.  Lymphadenopathy:     Cervical: No cervical adenopathy.  Skin:    General: Skin is warm and dry.     Coloration: Skin is pale.  Neurological:     General: No focal deficit present.     Mental Status: He is alert and oriented to person, place, and time.  Psychiatric:        Mood and Affect: Mood normal.        Behavior: Behavior normal.      LABORATORY DATA:  I have reviewed the labs as listed.  CBC    Component Value Date/Time   WBC 3.6 (L) 07/31/2020 1308   RBC 2.80 (L) 07/31/2020 1308   HGB 7.2 (L) 07/31/2020 1308   HCT 24.5 (L) 07/31/2020 1308   PLT 66 (L) 07/31/2020 1308   MCV 87.5 07/31/2020 1308   MCH 25.7 (L) 07/31/2020 1308   MCHC 29.4 (L) 07/31/2020 1308   RDW 18.7 (H) 07/31/2020 1308   LYMPHSABS 0.7 07/10/2020 1303   MONOABS 0.2 07/10/2020 1303   EOSABS 0.0 07/10/2020 1303   BASOSABS 0.0 07/10/2020 1303   CMP Latest Ref Rng & Units 07/27/2020 06/26/2020 04/04/2020  Glucose 70 - 99 mg/dL 105(H) 99 98  BUN 8 - 23 mg/dL 39(H) 35(H) 40(H)  Creatinine 0.61 - 1.24 mg/dL 2.43(H) 1.86(H) 1.73(H)  Sodium 135 - 145 mmol/L 138 135 137  Potassium 3.5 - 5.1 mmol/L 4.2 4.2 3.3(L)  Chloride 98 - 111 mmol/L 104 107 104  CO2 22 - 32 mmol/L 25 20(L) 24  Calcium 8.9 - 10.3 mg/dL 11.7(H) 9.5 9.0  Total Protein 6.5 - 8.1 g/dL - - -  Total Bilirubin 0.3 - 1.2  mg/dL - - -  Alkaline Phos 38 - 126 U/L - - -  AST  15 - 41 U/L - - -  ALT 0 - 44 U/L - - -    DIAGNOSTIC IMAGING:  I have independently reviewed the relevant imaging and discussed with the patient.  ASSESSMENT:   1. Myelodysplastic syndrome with pancytopenia: -Bone marrow biopsy on 12/08/2016 in [place], [place] showed normocellular marrow with trilineage hematopoiesis with erythroid predominance and dysmegakaryopoiesis.Blasts were not increased. Cytogenetics showed 46, XY,del(12)(p.2p13)[8]/46,XY[12]. FISH was positive for 12p-. -Consistent with myelodysplastic syndrome with single lineage dysplasia (MDS-SLD),IPSS-Rscore of 2, consistent with low risk category -Hospitalized on 04/03/2020 and was found to have hemoglobin 6.6 and received 2 units PRBC. -ED visit 06/26/2020 due to presyncopal episode, hemoglobin 7.1, received unit RBC -No improvement on Aranesp injections, was started on weekly Retacrit injections on 07/10/2020   PLAN:  1.Low risk MDS with pancytopenia -Reviewed his labs today 08/07/2020. Hemoglobin is  7.1, trending downward; WBC 4.5, platelets 90 -Labs from 07/10/2020 show ferritin was 344, percent saturation 38.  J50 and folic acid was normal. -He denies any bleeding per rectum or melena - we will check guaiac to rule out occult GI bleed -Due to severe fatigue and hemoglobin 7.1, will schedule for RBC transfusion x1 week -Continue weekly Retacrit, despite poor response (if patient still has absence of erythroid response after 8 to 12 weeks of ESA treatment, he will meet criteria for ESA failure) -Due to persistent anemia and pancytopenia despite ESA, schedule patient for repeat bone marrow biopsy -We will consider adding hypomethylating agent to treatment protocol at next visit -RTC in 4 weeks to follow-up on bone marrow biopsy results, labs, and to discuss further treatment options  2. CKD: -He has CKD with creatinine ranging from 1.5-1.9. -This is also contributing to his anemia.  3.  Long-term use of  corticosteroid  -Per patient report, has been taking 10 mg prednisone daily for the past 3 years, initially prescribed by his PCP in [place] -At last visit, was prescribed 5 mg prednisone x30 days to avoid steroid withdrawal -Instructed him to follow-up with his new PCP in [place] to determine if he needs to continue on prednisone, and for further prescriptions and refills  *NOTE: Patient states that his daughter, Lahoma Rocker, may call and ask for updates on his condition.  He is given full permission to discuss his condition, findings, and treatment options with her.   PLAN SUMMARY & DISPOSITION: -Continue weekly labs and Retacrit -Hemoccult stool/guaiac -Schedule RBC transfusion x1 this week -Scheduled for bone marrow biopsy -RTC in 4 weeks to discuss results and decide on ongoing treatment plan   All questions were answered. The patient knows to call the clinic with any problems, questions or concerns.  Medical decision making: Moderate (chronic illness with exacerbation, review of prior results x1, ordering new test x1, decision regarding minor surgery/bone marrow biopsy)  Time spent on visit: I spent 20 minutes counseling the patient face to face. The total time spent in the appointment was 30 minutes and more than 50% was on counseling.  The above patient case and plan of care was discussed with Dr. Delton Coombes, supervising physician, who agrees.   Harriett Rush, PA-C  08/07/20 9:16 AM

## 2020-08-09 ENCOUNTER — Inpatient Hospital Stay (HOSPITAL_COMMUNITY): Payer: Medicare Other

## 2020-08-09 ENCOUNTER — Other Ambulatory Visit: Payer: Self-pay

## 2020-08-09 ENCOUNTER — Other Ambulatory Visit (HOSPITAL_COMMUNITY): Payer: Self-pay | Admitting: Physician Assistant

## 2020-08-09 DIAGNOSIS — D469 Myelodysplastic syndrome, unspecified: Secondary | ICD-10-CM

## 2020-08-09 DIAGNOSIS — D649 Anemia, unspecified: Secondary | ICD-10-CM

## 2020-08-09 MED ORDER — ACETAMINOPHEN 325 MG PO TABS
ORAL_TABLET | ORAL | Status: AC
  Filled 2020-08-09: qty 2

## 2020-08-09 MED ORDER — SODIUM CHLORIDE 0.9% FLUSH: 10.0000 mL | INTRAVENOUS | Status: DC | PRN

## 2020-08-09 MED ORDER — SODIUM CHLORIDE 0.9% IV SOLUTION
250.0000 mL | Freq: Once | INTRAVENOUS | Status: AC
  Administered 2020-08-09: 250 mL via INTRAVENOUS

## 2020-08-09 MED ORDER — ACETAMINOPHEN 325 MG PO TABS
650.0000 mg | ORAL_TABLET | Freq: Once | ORAL | Status: AC
  Administered 2020-08-09: 650 mg via ORAL

## 2020-08-09 MED ORDER — DIPHENHYDRAMINE HCL 25 MG PO CAPS
ORAL_CAPSULE | ORAL | Status: AC
  Filled 2020-08-09: qty 1

## 2020-08-09 MED ORDER — DIPHENHYDRAMINE HCL 25 MG PO CAPS
25.0000 mg | ORAL_CAPSULE | Freq: Once | ORAL | Status: AC
  Administered 2020-08-09: 25 mg via ORAL

## 2020-08-09 NOTE — Progress Notes (Signed)
Patient presents today for 1UPRBC.  BP 87/47.  Patient has been weak and short of breath with minimal exertion.  Peripheral IV started and good blood return noted.  1UPRBC given today per MD orders. Stable during infusion without adverse affects.  Vital signs stable.  No complaints at this time.  Discharge from clinic via wheelchair in stable condition.  Alert and oriented X 3.  Follow up with [facility] as scheduled.

## 2020-08-09 NOTE — Patient Instructions (Signed)

## 2020-08-10 LAB — BPAM RBC
Blood Product Expiration Date: 202204212359
ISSUE DATE / TIME: 202203250858
Unit Type and Rh: 1700

## 2020-08-10 LAB — TYPE AND SCREEN
ABO/RH(D): B NEG
Antibody Screen: NEGATIVE
Unit division: 0

## 2020-08-12 ENCOUNTER — Other Ambulatory Visit: Payer: Self-pay

## 2020-08-12 ENCOUNTER — Encounter (INDEPENDENT_AMBULATORY_CARE_PROVIDER_SITE_OTHER): Payer: Self-pay

## 2020-08-12 ENCOUNTER — Inpatient Hospital Stay (HOSPITAL_BASED_OUTPATIENT_CLINIC_OR_DEPARTMENT_OTHER): Payer: Medicare Other | Admitting: Hematology

## 2020-08-12 ENCOUNTER — Other Ambulatory Visit (HOSPITAL_COMMUNITY): Payer: Self-pay

## 2020-08-12 DIAGNOSIS — D649 Anemia, unspecified: Secondary | ICD-10-CM | POA: Diagnosis not present

## 2020-08-12 DIAGNOSIS — D696 Thrombocytopenia, unspecified: Secondary | ICD-10-CM | POA: Diagnosis not present

## 2020-08-12 DIAGNOSIS — D469 Myelodysplastic syndrome, unspecified: Secondary | ICD-10-CM | POA: Diagnosis not present

## 2020-08-12 DIAGNOSIS — D509 Iron deficiency anemia, unspecified: Secondary | ICD-10-CM | POA: Diagnosis not present

## 2020-08-12 LAB — CBC WITH DIFFERENTIAL/PLATELET
Abs Immature Granulocytes: 0.08 10*3/uL — ABNORMAL HIGH (ref 0.00–0.07)
Basophils Absolute: 0 10*3/uL (ref 0.0–0.1)
Basophils Relative: 0 %
Eosinophils Absolute: 0 10*3/uL (ref 0.0–0.5)
Eosinophils Relative: 0 %
HCT: 29.9 % — ABNORMAL LOW (ref 39.0–52.0)
Hemoglobin: 8.9 g/dL — ABNORMAL LOW (ref 13.0–17.0)
Immature Granulocytes: 2 %
Lymphocytes Relative: 16 %
Lymphs Abs: 0.7 10*3/uL (ref 0.7–4.0)
MCH: 26.6 pg (ref 26.0–34.0)
MCHC: 29.8 g/dL — ABNORMAL LOW (ref 30.0–36.0)
MCV: 89.3 fL (ref 80.0–100.0)
Monocytes Absolute: 0.3 10*3/uL (ref 0.1–1.0)
Monocytes Relative: 7 %
Neutro Abs: 3.6 10*3/uL (ref 1.7–7.7)
Neutrophils Relative %: 75 %
Platelets: 78 10*3/uL — ABNORMAL LOW (ref 150–400)
RBC: 3.35 MIL/uL — ABNORMAL LOW (ref 4.22–5.81)
RDW: 18.1 % — ABNORMAL HIGH (ref 11.5–15.5)
WBC: 4.8 10*3/uL (ref 4.0–10.5)
nRBC: 0.6 % — ABNORMAL HIGH (ref 0.0–0.2)

## 2020-08-12 LAB — OCCULT BLOOD X 1 CARD TO LAB, STOOL
Fecal Occult Bld: NEGATIVE
Fecal Occult Bld: NEGATIVE
Fecal Occult Bld: NEGATIVE

## 2020-08-12 MED ORDER — OCTREOTIDE ACETATE 30 MG IM KIT
PACK | INTRAMUSCULAR | Status: AC
  Filled 2020-08-12: qty 1

## 2020-08-12 NOTE — Progress Notes (Signed)
INDICATION:  Myelodysplastic syndrome.  Bone Marrow Biopsy and Aspiration Procedure Note   The patient was identified by name and date of birth, prior to start of the procedure and a timeout was performed.   An informed consent was obtained after discussing potential risks including bleeding, infection and pain.  The right posterior iliac crest was palpated, cleaned with ChloraPrep, and drapes applied.  1% lidocaine is infiltrated into the skin, subcutaneous tissue and periosteum.  Bone marrow was aspirated and smears made.  With the help of Jamshidi needle a core biopsy was obtained.  Pressure was applied to the biopsy site and bandage was placed over the biopsy site. Patient was made to lie on the back for 15 mins prior to discharge.  The procedure was tolerated well. COMPLICATIONS: None BLOOD LOSS: none Patient was discharged home in stable condition to return in 2 weeks to review results.  Patient was provided with post bone marrow biopsy instructions and instructed to call if there was any bleeding or worsening pain.  Specimens sent for flow cytometry, cytogenetics and additional studies.  Signed Derek Jack, MD

## 2020-08-12 NOTE — Progress Notes (Signed)
[name], [place] [address]   CLINIC:  Medical Oncology/Hematology  PCP:  Sharilyn Sites, [place] [address] [phone]   REASON FOR VISIT:  Follow-up for myelodysplastic syndrome  PRIOR THERAPY: RBC transfusions, monthly Aranesp  CURRENT THERAPY: Weekly Retacrit  INTERVAL HISTORY:  [name] 87 y.o. male seen for follow-up of MDS and anemia.  He was started on 60,000 units of Procrit weekly around 07/10/2020. He does not report any bleeding per rectum or melena.  He is tolerating Procrit very well.  However his shortness of breath on exertion has not improved.  He has to stop after 50 feet to catch up with breathing.  Denies any fevers, night sweats or weight loss.  REVIEW OF SYSTEMS:  Review of Systems  Constitutional: Positive for fatigue.  Eyes: Negative for eye problems.  Respiratory: Positive for shortness of breath. Negative for cough and hemoptysis.   Skin: Negative.   Neurological: Negative for light-headedness.  Hematological: Does not bruise/bleed easily.  All other systems reviewed and are negative.     PAST MEDICAL/SURGICAL HISTORY:  Past Medical History:  Diagnosis Date  . Bone cancer ([facility])   . Chronic kidney insufficiency   . COPD (chronic obstructive pulmonary disease) ([place] [place])   . GERD (gastroesophageal reflux disease)   . Pulmonary nodule    Past Surgical History:  Procedure Laterality Date  . BACK SURGERY    . CATARACT EXTRACTION W/PHACO Right 03/09/2016   Procedure: CATARACT EXTRACTION PHACO AND INTRAOCULAR LENS PLACEMENT RIGHT EYE CDE=8.58;  Surgeon: Tonny Branch, MD;  Location: [facility];  Service: Ophthalmology;  Laterality: Right;  right  . CATARACT EXTRACTION W/PHACO Left 04/13/2016   Procedure: CATARACT EXTRACTION PHACO AND INTRAOCULAR LENS PLACEMENT (IOC);  Surgeon: Tonny Branch, MD;  Location: [facility];  Service: Ophthalmology;  Laterality: Left;  CDE: 8.08  . KYPHOPLASTY Bilateral  06/09/2019   Procedure: T11 KYPHOPLASTY;  Surgeon: Consuella Lose, MD;  Location: [facility];  Service: Neurosurgery;  Laterality: Bilateral;  . None to Date  10/01/15     SOCIAL HISTORY:  Social History   Socioeconomic History  . Marital status: Widowed    Spouse name: Not on file  . Number of children: Not on file  . Years of education: Not on file  . Highest education level: Not on file  Occupational History  . Not on file  Tobacco Use  . Smoking status: Former Smoker    Years: 15.00    Types: Cigarettes, Cigars    Quit date: 10/01/1990    Years since quitting: 29.8  . Smokeless tobacco: Never Used  . Tobacco comment: Quit x 25-30 years; 2 cigars daily when smoked  Vaping Use  . Vaping Use: Never used  Substance and Sexual Activity  . Alcohol use: Not Currently    Alcohol/week: 0.0 standard drinks  . Drug use: No  . Sexual activity: Never    Birth control/protection: None  Other Topics Concern  . Not on file  Social History Narrative  . Not on file   Social Determinants of Health   Financial Resource Strain: High Risk  . Difficulty of Paying Living Expenses: Hard  Food Insecurity: No Food Insecurity  . Worried About Charity fundraiser in the Last Year: Never true  . Ran Out of Food in the Last Year: Never true  Transportation Needs: No Transportation Needs  . Lack of Transportation (Medical): No  . Lack of Transportation (Non-Medical): No  Physical  Activity: Inactive  . Days of Exercise per Week: 0 days  . Minutes of Exercise per Session: 0 min  Stress: No Stress Concern Present  . Feeling of Stress : Not at all  Social Connections: Moderately Integrated  . Frequency of Communication with Friends and Family: More than three times a week  . Frequency of Social Gatherings with Friends and Family: More than three times a week  . Attends Religious Services: More than 4 times per year  . Active Member of Clubs or Organizations: Yes  . Attends Archivist  Meetings: More than 4 times per year  . Marital Status: Widowed  Intimate Partner Violence: Not At Risk  . Fear of Current or Ex-Partner: No  . Emotionally Abused: No  . Physically Abused: No  . Sexually Abused: No    FAMILY HISTORY:  Family History  Problem Relation Age of Onset  . Kidney disease Mother   . Kidney disease Sister   . Colon cancer Neg Hx   . Gastric cancer Neg Hx   . Esophageal cancer Neg Hx     CURRENT MEDICATIONS:  Outpatient Encounter Medications as of 08/12/2020  Medication Sig  . acetaminophen (TYLENOL) 325 MG tablet Take 2 tablets (650 mg total) by mouth every 6 (six) hours as needed for mild pain, fever or headache (or Fever >/= 101).  Marland Kitchen albuterol (VENTOLIN HFA) 108 (90 Base) MCG/ACT inhaler Inhale 2 puffs into the lungs every 4 (four) hours as needed for wheezing or shortness of breath.  Marland Kitchen epoetin alfa (PROCRIT) 82956 UNIT/ML injection Inject 1 mL (10,000 Units total) into the skin once a week. (Patient taking differently: Inject 10,000 Units into the skin every 30 (thirty) days.)  . pantoprazole (PROTONIX) 40 MG tablet Take 40 mg by mouth daily.  . tamsulosin (FLOMAX) 0.4 MG CAPS capsule Take 1 capsule (0.4 mg total) by mouth daily after supper.  . tiotropium (SPIRIVA HANDIHALER) 18 MCG inhalation capsule Place 1 capsule (18 mcg total) into inhaler and inhale daily.   No facility-administered encounter medications on file as of 08/12/2020.    ALLERGIES:  No Known Allergies   PHYSICAL EXAM:  ECOG PERFORMANCE STATUS: 2 - Symptomatic, <50% confined to bed  Vitals:   08/12/20 0737 08/12/20 0821  BP: 130/71 (!) 117/55  Pulse: 74 68  Resp: 18 17  Temp: 98.1 F (36.7 C) 97.9 F (36.6 C)  SpO2: 100% 100%   There were no vitals filed for this visit. Physical Exam Vitals reviewed.  Constitutional:      Appearance: Normal appearance. He is underweight.  HENT:     Head: Normocephalic and atraumatic.  Cardiovascular:     Rate and Rhythm: Normal rate  and regular rhythm.     Heart sounds: Normal heart sounds.  Pulmonary:     Effort: Pulmonary effort is normal.     Breath sounds: Normal breath sounds.  Musculoskeletal:        General: No swelling.     Right lower leg: No edema.     Left lower leg: No edema.  Lymphadenopathy:     Cervical: No cervical adenopathy.  Skin:    General: Skin is warm and dry.     Coloration: Skin is pale.  Neurological:     General: No focal deficit present.     Mental Status: He is alert and oriented to person, place, and time.  Psychiatric:        Mood and Affect: Mood normal.  Behavior: Behavior normal.      LABORATORY DATA:  I have reviewed the labs as listed.  CBC    Component Value Date/Time   WBC 4.8 08/12/2020 0759   RBC 3.35 (L) 08/12/2020 0759   HGB 8.9 (L) 08/12/2020 0759   HCT 29.9 (L) 08/12/2020 0759   PLT 78 (L) 08/12/2020 0759   MCV 89.3 08/12/2020 0759   MCH 26.6 08/12/2020 0759   MCHC 29.8 (L) 08/12/2020 0759   RDW 18.1 (H) 08/12/2020 0759   LYMPHSABS 0.7 08/12/2020 0759   MONOABS 0.3 08/12/2020 0759   EOSABS 0.0 08/12/2020 0759   BASOSABS 0.0 08/12/2020 0759   CMP Latest Ref Rng & Units 07/27/2020 06/26/2020 04/04/2020  Glucose 70 - 99 mg/dL 105(H) 99 98  BUN 8 - 23 mg/dL 39(H) 35(H) 40(H)  Creatinine 0.61 - 1.24 mg/dL 2.43(H) 1.86(H) 1.73(H)  Sodium 135 - 145 mmol/L 138 135 137  Potassium 3.5 - 5.1 mmol/L 4.2 4.2 3.3(L)  Chloride 98 - 111 mmol/L 104 107 104  CO2 22 - 32 mmol/L 25 20(L) 24  Calcium 8.9 - 10.3 mg/dL 11.7(H) 9.5 9.0  Total Protein 6.5 - 8.1 g/dL - - -  Total Bilirubin 0.3 - 1.2 mg/dL - - -  Alkaline Phos 38 - 126 U/L - - -  AST 15 - 41 U/L - - -  ALT 0 - 44 U/L - - -    DIAGNOSTIC IMAGING:  I have independently reviewed the relevant imaging and discussed with the patient.  ASSESSMENT:   1. Myelodysplastic syndrome with pancytopenia: -Bone marrow biopsy on 12/08/2016 in [place], [place] showed normocellular marrow with trilineage  hematopoiesis with erythroid predominance and dysmegakaryopoiesis.Blasts were not increased. Cytogenetics showed 46, XY,del(12)(p.2p13)[8]/46,XY[12]. FISH was positive for 12p-. -Consistent with myelodysplastic syndrome with single lineage dysplasia (MDS-SLD),IPSS-Rscore of 2, consistent with low risk category -Hospitalized on 04/03/2020 and was found to have hemoglobin 6.6 and received 2 units PRBC. -ED visit 06/26/2020 due to presyncopal episode, hemoglobin 7.1, received unit RBC -No improvement on Aranesp injections, was started on weekly Retacrit injections on 07/10/2020   PLAN:  1.Low risk MDS with pancytopenia -He was changed from Aranesp to Retacrit on 07/10/2020, to dose of 60,000 units every week. -Stool for occult blood testing was negative. -CBC today showed hemoglobin 8.9 after 1 unit of PRBC on 08/07/2020. -As he is not getting response from a Retacrit, I have recommended bone marrow aspiration and biopsy. -We discussed the rare complications of biopsy including bleeding and infection. -RTC 2 weeks for follow-up after biopsy today.  2. CKD: -Baseline creatinine ranging between 1.5-1.9.  3.  Long-term use of corticosteroid  -He is continue prednisone 5 mg daily with plans of taper.   Derek Jack, MD  08/12/20 7:02 PM

## 2020-08-12 NOTE — Progress Notes (Signed)
Patient here today for bone marrow biopsy. Procedure explained and consent signed by all parties at 0739. Patient placed in prone position with both arms above head. Time out conducted at 0746 and team agreed. Procedure started at 0750. Patient tolerated procedure well with minimal pain and discomfort. Specimens collected and labeled appropriately. Procedure completed at 0802. Dressing applied and patient reposition on back, sitting up, resting at 0804. Specimens taken to lab for processing. Patient stable during procedure and discharged home in stable condition via wheelchair and escorted to car by nursing staff at [phone].

## 2020-08-14 ENCOUNTER — Encounter (HOSPITAL_COMMUNITY): Payer: Self-pay

## 2020-08-14 ENCOUNTER — Inpatient Hospital Stay (HOSPITAL_COMMUNITY): Payer: Medicare Other

## 2020-08-14 ENCOUNTER — Other Ambulatory Visit: Payer: Self-pay

## 2020-08-14 VITALS — BP 95/51 | HR 90 | Temp 97.1°F | Resp 18

## 2020-08-14 DIAGNOSIS — D469 Myelodysplastic syndrome, unspecified: Secondary | ICD-10-CM

## 2020-08-14 DIAGNOSIS — N1831 Chronic kidney disease, stage 3a: Secondary | ICD-10-CM

## 2020-08-14 DIAGNOSIS — D649 Anemia, unspecified: Secondary | ICD-10-CM

## 2020-08-14 LAB — SURGICAL PATHOLOGY

## 2020-08-14 LAB — SAMPLE TO BLOOD BANK

## 2020-08-14 LAB — CBC
HCT: 30.9 % — ABNORMAL LOW (ref 39.0–52.0)
Hemoglobin: 9.2 g/dL — ABNORMAL LOW (ref 13.0–17.0)
MCH: 26.4 pg (ref 26.0–34.0)
MCHC: 29.8 g/dL — ABNORMAL LOW (ref 30.0–36.0)
MCV: 88.8 fL (ref 80.0–100.0)
Platelets: 81 10*3/uL — ABNORMAL LOW (ref 150–400)
RBC: 3.48 MIL/uL — ABNORMAL LOW (ref 4.22–5.81)
RDW: 18.6 % — ABNORMAL HIGH (ref 11.5–15.5)
WBC: 4.6 10*3/uL (ref 4.0–10.5)
nRBC: 0.4 % — ABNORMAL HIGH (ref 0.0–0.2)

## 2020-08-14 MED ORDER — EPOETIN ALFA-EPBX 20000 UNIT/ML IJ SOLN
60000.0000 [IU] | Freq: Once | INTRAMUSCULAR | Status: AC
  Administered 2020-08-14: 60000 [IU] via SUBCUTANEOUS
  Filled 2020-08-14: qty 3

## 2020-08-14 NOTE — Patient Instructions (Signed)
[place] at [facility]  Discharge Instructions:  You received retacrit today.  Keep next scheduled appointment.  _______________________________________________________________  Thank you for choosing [place] at [facility] to provide your oncology and hematology care.  To afford each patient quality time with our providers, please arrive at least 15 minutes before your scheduled appointment.  You need to re-schedule your appointment if you arrive 10 or more minutes late.  We strive to give you quality time with our providers, and arriving late affects you and other patients whose appointments are after yours.  Also, if you no show three or more times for appointments you may be dismissed from the clinic.  Again, thank you for choosing [place] at [place] hope is that these requests will allow you access to exceptional care and in a timely manner. _______________________________________________________________  If you have questions after your visit, please contact our office at [phone] between the hours of 8:30 a.m. and 5:00 p.m. Voicemails left after 4:30 p.m. will not be returned until the following business day. _______________________________________________________________  For prescription refill requests, have your pharmacy contact our office. _______________________________________________________________  Recommendations made by the consultant and any test results will be sent to your referring physician. _______________________________________________________________

## 2020-08-14 NOTE — Progress Notes (Signed)
Patient presents today for Retacrit injection. Hemoglobin reviewed prior to administration. VSS. Injection tolerated without incident or complaint. See MAR for details. Patient stable during and after injection.  Patient discharged in satisfactory condition with follow up instructions. 

## 2020-08-20 ENCOUNTER — Encounter (HOSPITAL_COMMUNITY): Payer: Self-pay | Admitting: Hematology

## 2020-08-21 ENCOUNTER — Inpatient Hospital Stay (HOSPITAL_COMMUNITY): Payer: Medicare Other | Attending: Hematology

## 2020-08-21 ENCOUNTER — Other Ambulatory Visit (HOSPITAL_COMMUNITY): Payer: Self-pay

## 2020-08-21 ENCOUNTER — Other Ambulatory Visit: Payer: Self-pay

## 2020-08-21 ENCOUNTER — Inpatient Hospital Stay (HOSPITAL_COMMUNITY): Payer: Medicare Other

## 2020-08-21 ENCOUNTER — Encounter (HOSPITAL_COMMUNITY): Payer: Self-pay

## 2020-08-21 VITALS — BP 93/46 | HR 90 | Temp 97.0°F | Resp 18

## 2020-08-21 DIAGNOSIS — D649 Anemia, unspecified: Secondary | ICD-10-CM

## 2020-08-21 DIAGNOSIS — N1831 Chronic kidney disease, stage 3a: Secondary | ICD-10-CM

## 2020-08-21 DIAGNOSIS — D469 Myelodysplastic syndrome, unspecified: Secondary | ICD-10-CM | POA: Insufficient documentation

## 2020-08-21 DIAGNOSIS — J449 Chronic obstructive pulmonary disease, unspecified: Secondary | ICD-10-CM

## 2020-08-21 LAB — CBC WITH DIFFERENTIAL/PLATELET
Abs Immature Granulocytes: 0.06 10*3/uL (ref 0.00–0.07)
Basophils Absolute: 0 10*3/uL (ref 0.0–0.1)
Basophils Relative: 0 %
Eosinophils Absolute: 0 10*3/uL (ref 0.0–0.5)
Eosinophils Relative: 0 %
HCT: 30.1 % — ABNORMAL LOW (ref 39.0–52.0)
Hemoglobin: 8.9 g/dL — ABNORMAL LOW (ref 13.0–17.0)
Immature Granulocytes: 1 %
Lymphocytes Relative: 15 %
Lymphs Abs: 0.7 10*3/uL (ref 0.7–4.0)
MCH: 25.6 pg — ABNORMAL LOW (ref 26.0–34.0)
MCHC: 29.6 g/dL — ABNORMAL LOW (ref 30.0–36.0)
MCV: 86.5 fL (ref 80.0–100.0)
Monocytes Absolute: 0.4 10*3/uL (ref 0.1–1.0)
Monocytes Relative: 7 %
Neutro Abs: 3.8 10*3/uL (ref 1.7–7.7)
Neutrophils Relative %: 77 %
Platelets: 105 10*3/uL — ABNORMAL LOW (ref 150–400)
RBC: 3.48 MIL/uL — ABNORMAL LOW (ref 4.22–5.81)
RDW: 19.5 % — ABNORMAL HIGH (ref 11.5–15.5)
WBC: 5 10*3/uL (ref 4.0–10.5)
nRBC: 0.6 % — ABNORMAL HIGH (ref 0.0–0.2)

## 2020-08-21 LAB — SAMPLE TO BLOOD BANK

## 2020-08-21 MED ORDER — PREDNISONE 5 MG PO TABS: 5.0000 mg | ORAL_TABLET | Freq: Every day | ORAL | 0 refills | Status: AC

## 2020-08-21 MED ORDER — EPOETIN ALFA-EPBX 20000 UNIT/ML IJ SOLN
60000.0000 [IU] | Freq: Once | INTRAMUSCULAR | Status: AC
  Administered 2020-08-21: 60000 [IU] via SUBCUTANEOUS
  Filled 2020-08-21: qty 3

## 2020-08-21 NOTE — Telephone Encounter (Signed)
Will refill an additional 30 days of 5 mg prednisone.  Prednisone was initially prescribed by his primary care doctor in [place], but the patient is uncertain of why he is on this medication.  As discussed with the patient in previous appointments, we do not want to send him into steroid withdrawal, but this is something that really needs to be followed up with his primary care provider here in [place] for further management.

## 2020-08-21 NOTE — Patient Instructions (Signed)
[place] at [facility]  Discharge Instructions:  You received you Retacrit injection today. Your Prednisone refill was routed to the PA. Return as scheduled. _______________________________________________________________  Thank you for choosing [place] at [facility] to provide your oncology and hematology care.  To afford each patient quality time with our providers, please arrive at least 15 minutes before your scheduled appointment.  You need to re-schedule your appointment if you arrive 10 or more minutes late.  We strive to give you quality time with our providers, and arriving late affects you and other patients whose appointments are after yours.  Also, if you no show three or more times for appointments you may be dismissed from the clinic.  Again, thank you for choosing [place] at [place] hope is that these requests will allow you access to exceptional care and in a timely manner. _______________________________________________________________  If you have questions after your visit, please contact our office at [phone] between the hours of 8:30 a.m. and 5:00 p.m. Voicemails left after 4:30 p.m. will not be returned until the following business day. _______________________________________________________________  For prescription refill requests, have your pharmacy contact our office. _______________________________________________________________  Recommendations made by the consultant and any test results will be sent to your referring physician. _______________________________________________________________

## 2020-08-21 NOTE — Progress Notes (Signed)
Patient presents today for Retacrit injection. Hemoglobin reviewed prior to administration. VSS tolerated without incident or complaint. See MAR for details. Patient stable during and after injection. Patient discharged in satisfactory condition with no s/s of distress noted.  

## 2020-08-22 ENCOUNTER — Telehealth (HOSPITAL_COMMUNITY): Payer: Self-pay

## 2020-08-22 NOTE — Telephone Encounter (Signed)
Patient notified of Prednisone refill and he will have to call/see his PCP for further refills per PA.  Patient verbalized understanding.

## 2020-08-23 DIAGNOSIS — R300 Dysuria: Secondary | ICD-10-CM | POA: Diagnosis not present

## 2020-08-23 DIAGNOSIS — R350 Frequency of micturition: Secondary | ICD-10-CM | POA: Diagnosis not present

## 2020-08-23 DIAGNOSIS — N400 Enlarged prostate without lower urinary tract symptoms: Secondary | ICD-10-CM | POA: Diagnosis not present

## 2020-08-28 ENCOUNTER — Inpatient Hospital Stay (HOSPITAL_COMMUNITY): Payer: Medicare Other

## 2020-08-28 ENCOUNTER — Other Ambulatory Visit: Payer: Self-pay

## 2020-08-28 ENCOUNTER — Encounter (HOSPITAL_COMMUNITY): Payer: Self-pay

## 2020-08-28 VITALS — BP 101/57 | HR 80 | Temp 97.0°F | Resp 18

## 2020-08-28 DIAGNOSIS — D649 Anemia, unspecified: Secondary | ICD-10-CM

## 2020-08-28 DIAGNOSIS — D469 Myelodysplastic syndrome, unspecified: Secondary | ICD-10-CM

## 2020-08-28 DIAGNOSIS — N1831 Chronic kidney disease, stage 3a: Secondary | ICD-10-CM

## 2020-08-28 LAB — CBC WITH DIFFERENTIAL/PLATELET
Abs Immature Granulocytes: 0.04 10*3/uL (ref 0.00–0.07)
Basophils Absolute: 0 10*3/uL (ref 0.0–0.1)
Basophils Relative: 0 %
Eosinophils Absolute: 0 10*3/uL (ref 0.0–0.5)
Eosinophils Relative: 0 %
HCT: 28.4 % — ABNORMAL LOW (ref 39.0–52.0)
Hemoglobin: 8.3 g/dL — ABNORMAL LOW (ref 13.0–17.0)
Immature Granulocytes: 2 %
Lymphocytes Relative: 27 %
Lymphs Abs: 0.6 10*3/uL — ABNORMAL LOW (ref 0.7–4.0)
MCH: 24.9 pg — ABNORMAL LOW (ref 26.0–34.0)
MCHC: 29.2 g/dL — ABNORMAL LOW (ref 30.0–36.0)
MCV: 85.3 fL (ref 80.0–100.0)
Monocytes Absolute: 0.1 10*3/uL (ref 0.1–1.0)
Monocytes Relative: 5 %
Neutro Abs: 1.4 10*3/uL — ABNORMAL LOW (ref 1.7–7.7)
Neutrophils Relative %: 66 %
Platelets: 82 10*3/uL — ABNORMAL LOW (ref 150–400)
RBC: 3.33 MIL/uL — ABNORMAL LOW (ref 4.22–5.81)
RDW: 18.8 % — ABNORMAL HIGH (ref 11.5–15.5)
WBC: 2.2 10*3/uL — ABNORMAL LOW (ref 4.0–10.5)
nRBC: 0 % (ref 0.0–0.2)

## 2020-08-28 LAB — SAMPLE TO BLOOD BANK

## 2020-08-28 MED ORDER — EPOETIN ALFA-EPBX 20000 UNIT/ML IJ SOLN
60000.0000 [IU] | Freq: Once | INTRAMUSCULAR | Status: AC
  Administered 2020-08-28: 60000 [IU] via SUBCUTANEOUS

## 2020-08-28 MED ORDER — EPOETIN ALFA-EPBX 20000 UNIT/ML IJ SOLN
INTRAMUSCULAR | Status: AC
  Filled 2020-08-28: qty 3

## 2020-08-28 NOTE — Patient Instructions (Signed)
[facility] at [facility]  Discharge Instructions:   _______________________________________________________________  Thank you for choosing [facility] at [facility] to provide your oncology and hematology care.  To afford each patient quality time with our providers, please arrive at least 15 minutes before your scheduled appointment.  You need to re-schedule your appointment if you arrive 10 or more minutes late.  We strive to give you quality time with our providers, and arriving late affects you and other patients whose appointments are after yours.  Also, if you no show three or more times for appointments you may be dismissed from the clinic.  Again, thank you for choosing [facility] at [facility]. Our hope is that these requests will allow you access to exceptional care and in a timely manner. _______________________________________________________________  If you have questions after your visit, please contact our office at [phone] between the hours of 8:30 a.m. and 5:00 p.m. Voicemails left after 4:30 p.m. will not be returned until the following business day. _______________________________________________________________  For prescription refill requests, have your pharmacy contact our office. _______________________________________________________________  Recommendations made by the consultant and any test results will be sent to your referring physician. _______________________________________________________________ 

## 2020-08-28 NOTE — Progress Notes (Signed)
Retacrit 60,000 Units given today per MD orders. Tolerated without adverse affects. Vital signs stable. No complaints at this time. Discharged from clinic via mobile scooter in stable condition. Alert and oriented x 3. F/U with [facility] as scheduled.

## 2020-09-04 ENCOUNTER — Inpatient Hospital Stay (HOSPITAL_COMMUNITY): Payer: Medicare Other

## 2020-09-04 ENCOUNTER — Inpatient Hospital Stay (HOSPITAL_BASED_OUTPATIENT_CLINIC_OR_DEPARTMENT_OTHER): Payer: Medicare Other | Admitting: Hematology

## 2020-09-04 ENCOUNTER — Other Ambulatory Visit: Payer: Self-pay

## 2020-09-04 VITALS — BP 107/59 | HR 79 | Temp 97.0°F | Resp 18 | Wt 134.2 lb

## 2020-09-04 DIAGNOSIS — D469 Myelodysplastic syndrome, unspecified: Secondary | ICD-10-CM

## 2020-09-04 DIAGNOSIS — D649 Anemia, unspecified: Secondary | ICD-10-CM

## 2020-09-04 LAB — CBC WITH DIFFERENTIAL/PLATELET
Abs Immature Granulocytes: 0.03 10*3/uL (ref 0.00–0.07)
Basophils Absolute: 0 10*3/uL (ref 0.0–0.1)
Basophils Relative: 1 %
Eosinophils Absolute: 0 10*3/uL (ref 0.0–0.5)
Eosinophils Relative: 0 %
HCT: 27.8 % — ABNORMAL LOW (ref 39.0–52.0)
Hemoglobin: 8.2 g/dL — ABNORMAL LOW (ref 13.0–17.0)
Immature Granulocytes: 2 %
Lymphocytes Relative: 35 %
Lymphs Abs: 0.7 10*3/uL (ref 0.7–4.0)
MCH: 25 pg — ABNORMAL LOW (ref 26.0–34.0)
MCHC: 29.5 g/dL — ABNORMAL LOW (ref 30.0–36.0)
MCV: 84.8 fL (ref 80.0–100.0)
Monocytes Absolute: 0.1 10*3/uL (ref 0.1–1.0)
Monocytes Relative: 5 %
Neutro Abs: 1.1 10*3/uL — ABNORMAL LOW (ref 1.7–7.7)
Neutrophils Relative %: 57 %
Platelets: 93 10*3/uL — ABNORMAL LOW (ref 150–400)
RBC: 3.28 MIL/uL — ABNORMAL LOW (ref 4.22–5.81)
RDW: 19.3 % — ABNORMAL HIGH (ref 11.5–15.5)
WBC: 2 10*3/uL — ABNORMAL LOW (ref 4.0–10.5)
nRBC: 1 % — ABNORMAL HIGH (ref 0.0–0.2)

## 2020-09-04 LAB — SAMPLE TO BLOOD BANK

## 2020-09-04 NOTE — Patient Instructions (Signed)
[place] at [facility] Discharge Instructions  You were seen today by Dr. Delton Coombes and Tarri Abernethy PA-C for your myelodysplastic syndrome.  Due to the progression of your MDS, we recommend that you start treatment with azacitidine.  We will set you up for labs, pre-treatment visit, and first dose of treatment on 09/16/2020.  You will no longer be receiving your weekly Retacrit injections.  Thank you for choosing [place] at [facility] to provide your oncology and hematology care.  To afford each patient quality time with our provider, please arrive at least 15 minutes before your scheduled appointment time.   If you have a lab appointment with the [facility] please come in thru the Main Entrance and check in at the main information desk.  You need to re-schedule your appointment should you arrive 10 or more minutes late.  We strive to give you quality time with our providers, and arriving late affects you and other patients whose appointments are after yours.  Also, if you no show three or more times for appointments you may be dismissed from the clinic at the providers discretion.     Again, thank you for choosing [facility].  Our hope is that these requests will decrease the amount of time that you wait before being seen by our physicians.       _____________________________________________________________  Should you have questions after your visit to [facility], please contact our office at [phone] and follow the prompts.  Our office hours are 8:00 a.m. and 4:30 p.m. Monday - Friday.  Please note that voicemails left after 4:00 p.m. may not be returned until the following business day.  We are closed weekends and major holidays.  You do have access to a nurse 24-7, just call the main number to the clinic [phone] and do not press any options, hold on the line and a nurse will answer the phone.     For prescription refill requests, have your pharmacy contact our office and allow 72 hours.    Due to Covid, you will need to wear a mask upon entering the hospital. If you do not have a mask, a mask will be given to you at the Main Entrance upon arrival. For doctor visits, patients may have 1 support person age 65 or older with them. For treatment visits, patients can not have anyone with them due to social distancing guidelines and our immunocompromised population.

## 2020-09-04 NOTE — Progress Notes (Signed)
[name],  [id number]   CLINIC:  Medical Oncology/Hematology  PCP:  Sharilyn Sites, [place] [address] [phone]   REASON FOR VISIT:  Follow-up for myelodysplastic syndrome  PRIOR THERAPY: Monthly Aranesp  CURRENT THERAPY: Intermittent PRBC transfusions, weekly Retacrit  INTERVAL HISTORY:  [name] 87 y.o. male returns for routine follow-up of myelodysplastic syndrome.  He was last seen by Dr. Delton Coombes on 08/12/2020 for bone marrow biopsy.  Most recent blood transfusion was on 08/09/2020.  He continues to receive 60,000 units of Retacrit each week, but with no significant response.  Bone marrow biopsy and testing showed progression of patient's myelodysplastic syndrome from low risk category to high-risk based on the Revised International Prognostic Scoring System.  (See further discussion of bone marrow biopsy results below in the Assessment section of this note.)  Labs today (09/04/2020) show WBC 2.0 (ANC 1.1) and Hgb 82, platelets 93.  Since his last visit, the patient reports progressive fatigue, dyspnea on exertion, and occasional pre-syncopal episodes (last was 2 weeks ago).  He cannot walk more than 50 feet without stopping.  He is requiring frequent breaks to take care of his ADLs.  He reports little to no energy.  Appetite is at 0% - he has no interest in eating, but tries to force himself.  His weight has been stable since his visit one month ago.  He continues to drink two Ensures per day.  No fever, chills, or night sweats.  He denies chest pain and palpitations.  He does have persistent leukopenia (WBC 2.0), and had a UTI about 2 weeks ago, but he is feeling better now after antibiotics.  Denies any current signs or symptoms of infectious systemic illness.  In light of his persistent thrombocytopenia (platelets 93), he reports easy bruising, but denies excessive bleeding. Denies epistaxis,  hemoptysis, hematemesis, hematochezia, melena, hematuria.    REVIEW OF SYSTEMS:  Review of Systems  Constitutional: Positive for appetite change and fatigue. Negative for chills, diaphoresis, fever and unexpected weight change.  HENT:   Negative for lump/mass and nosebleeds.   Eyes: Negative for eye problems.  Respiratory: Positive for shortness of breath. Negative for cough and hemoptysis.   Cardiovascular: Negative for chest pain, leg swelling and palpitations.  Gastrointestinal: Positive for constipation. Negative for abdominal pain, blood in stool, diarrhea, nausea and vomiting.  Genitourinary: Negative for hematuria.        UTI 1 week ago  Skin: Negative.   Neurological: Negative for dizziness, headaches and light-headedness.  Hematological: Does not bruise/bleed easily.  Psychiatric/Behavioral: Positive for sleep disturbance.      PAST MEDICAL/SURGICAL HISTORY:  Past Medical History:  Diagnosis Date  . Bone cancer ([place])   . Chronic kidney insufficiency   . COPD (chronic obstructive pulmonary disease) ([place])   . GERD (gastroesophageal reflux disease)   . Pulmonary nodule    Past Surgical History:  Procedure Laterality Date  . BACK SURGERY    . CATARACT EXTRACTION W/PHACO Right 03/09/2016   Procedure: CATARACT EXTRACTION PHACO AND INTRAOCULAR LENS PLACEMENT RIGHT EYE CDE=8.58;  Surgeon: Tonny Branch, MD;  Location: [facility];  Service: Ophthalmology;  Laterality: Right;  right  . CATARACT EXTRACTION W/PHACO Left 04/13/2016   Procedure: CATARACT EXTRACTION PHACO AND INTRAOCULAR LENS PLACEMENT (IOC);  Surgeon: Tonny Branch, MD;  Location: [facility];  Service: Ophthalmology;  Laterality: Left;  CDE: 8.08  . KYPHOPLASTY Bilateral 06/09/2019   Procedure: T11 KYPHOPLASTY;  Surgeon: Consuella Lose,  MD;  Location: [place];  Service: Neurosurgery;  Laterality: Bilateral;  . None to Date  10/01/15     SOCIAL HISTORY:  Social History   Socioeconomic History  . Marital status: Widowed     Spouse name: Not on file  . Number of children: Not on file  . Years of education: Not on file  . Highest education level: Not on file  Occupational History  . Not on file  Tobacco Use  . Smoking status: Former Smoker    Years: 15.00    Types: Cigarettes, Cigars    Quit date: 10/01/1990    Years since quitting: 29.9  . Smokeless tobacco: Never Used  . Tobacco comment: Quit x 25-30 years; 2 cigars daily when smoked  Vaping Use  . Vaping Use: Never used  Substance and Sexual Activity  . Alcohol use: Not Currently    Alcohol/week: 0.0 standard drinks  . Drug use: No  . Sexual activity: Never    Birth control/protection: None  Other Topics Concern  . Not on file  Social History Narrative  . Not on file   Social Determinants of Health   Financial Resource Strain: High Risk  . Difficulty of Paying Living Expenses: Hard  Food Insecurity: No Food Insecurity  . Worried About Charity fundraiser in the Last Year: Never true  . Ran Out of Food in the Last Year: Never true  Transportation Needs: No Transportation Needs  . Lack of Transportation (Medical): No  . Lack of Transportation (Non-Medical): No  Physical Activity: Inactive  . Days of Exercise per Week: 0 days  . Minutes of Exercise per Session: 0 min  Stress: No Stress Concern Present  . Feeling of Stress : Not at all  Social Connections: Moderately Integrated  . Frequency of Communication with Friends and Family: More than three times a week  . Frequency of Social Gatherings with Friends and Family: More than three times a week  . Attends Religious Services: More than 4 times per year  . Active Member of Clubs or Organizations: Yes  . Attends Archivist Meetings: More than 4 times per year  . Marital Status: Widowed  Intimate Partner Violence: Not At Risk  . Fear of Current or Ex-Partner: No  . Emotionally Abused: No  . Physically Abused: No  . Sexually Abused: No    FAMILY HISTORY:  Family History   Problem Relation Age of Onset  . Kidney disease Mother   . Kidney disease Sister   . Colon cancer Neg Hx   . Gastric cancer Neg Hx   . Esophageal cancer Neg Hx     CURRENT MEDICATIONS:  Outpatient Encounter Medications as of 09/04/2020  Medication Sig  . acetaminophen (TYLENOL) 325 MG tablet Take 2 tablets (650 mg total) by mouth every 6 (six) hours as needed for mild pain, fever or headache (or Fever >/= 101).  Marland Kitchen albuterol (VENTOLIN HFA) 108 (90 Base) MCG/ACT inhaler Inhale 2 puffs into the lungs every 4 (four) hours as needed for wheezing or shortness of breath.  . [EXPIRED] doxycycline (VIBRA-TABS) 100 MG tablet Take by mouth.  . doxycycline (VIBRA-TABS) 100 MG tablet Take 100 mg by mouth 2 (two) times daily.  Marland Kitchen epoetin alfa (PROCRIT) 48546 UNIT/ML injection Inject 1 mL (10,000 Units total) into the skin once a week. (Patient taking differently: Inject 10,000 Units into the skin every 30 (thirty) days.)  . pantoprazole (PROTONIX) 40 MG tablet Take 40 mg by mouth daily.  Marland Kitchen  predniSONE (DELTASONE) 5 MG tablet Take 1 tablet (5 mg total) by mouth daily with breakfast for 30 doses.  . tamsulosin (FLOMAX) 0.4 MG CAPS capsule Take 1 capsule (0.4 mg total) by mouth daily after supper.  . tamsulosin (FLOMAX) 0.4 MG CAPS capsule Take 1 capsule by mouth daily.  Marland Kitchen tiotropium (SPIRIVA HANDIHALER) 18 MCG inhalation capsule Place 1 capsule (18 mcg total) into inhaler and inhale daily.   No facility-administered encounter medications on file as of 09/04/2020.    ALLERGIES:  No Known Allergies   PHYSICAL EXAM:  ECOG PERFORMANCE STATUS: 2 - Symptomatic, <50% confined to bed  Vitals:   09/04/20 1000  BP: (!) 107/59  Pulse: 79  Resp: 18  Temp: (!) 97 F (36.1 C)  SpO2: 100%   Filed Weights   09/04/20 1000  Weight: 134 lb 3.2 oz (60.9 kg)   Physical Exam Constitutional:      Appearance: Normal appearance. He is underweight.     Interventions: Nasal cannula in place.  HENT:     Head:  Normocephalic and atraumatic.     Mouth/Throat:     Mouth: Mucous membranes are moist.  Eyes:     Extraocular Movements: Extraocular movements intact.     Pupils: Pupils are equal, round, and reactive to light.  Cardiovascular:     Rate and Rhythm: Normal rate and regular rhythm.     Pulses: Normal pulses.     Heart sounds: Normal heart sounds.  Pulmonary:     Effort: Pulmonary effort is normal.     Breath sounds: Decreased breath sounds present. No wheezing, rhonchi or rales.  Abdominal:     General: Bowel sounds are normal.     Palpations: Abdomen is soft.     Tenderness: There is no abdominal tenderness.  Musculoskeletal:        General: No swelling.     Right lower leg: No edema.     Left lower leg: No edema.  Lymphadenopathy:     Cervical: No cervical adenopathy.  Skin:    General: Skin is warm and dry.     Coloration: Skin is pale.     Findings: Bruising (Brusing and ecchymosis over extermities) and ecchymosis present.  Neurological:     General: No focal deficit present.     Mental Status: He is alert and oriented to person, place, and time.  Psychiatric:        Mood and Affect: Mood normal.        Behavior: Behavior normal.      LABORATORY DATA:  I have reviewed the labs as listed.  CBC    Component Value Date/Time   WBC 2.0 (L) 09/04/2020 0901   RBC 3.28 (L) 09/04/2020 0901   HGB 8.2 (L) 09/04/2020 0901   HCT 27.8 (L) 09/04/2020 0901   PLT 93 (L) 09/04/2020 0901   MCV 84.8 09/04/2020 0901   MCH 25.0 (L) 09/04/2020 0901   MCHC 29.5 (L) 09/04/2020 0901   RDW 19.3 (H) 09/04/2020 0901   LYMPHSABS 0.7 09/04/2020 0901   MONOABS 0.1 09/04/2020 0901   EOSABS 0.0 09/04/2020 0901   BASOSABS 0.0 09/04/2020 0901   CMP Latest Ref Rng & Units 07/27/2020 06/26/2020 04/04/2020  Glucose 70 - 99 mg/dL 105(H) 99 98  BUN 8 - 23 mg/dL 39(H) 35(H) 40(H)  Creatinine 0.61 - 1.24 mg/dL 2.43(H) 1.86(H) 1.73(H)  Sodium 135 - 145 mmol/L 138 135 137  Potassium 3.5 - 5.1 mmol/L  4.2 4.2 3.3(L)  Chloride 98 -  111 mmol/L 104 107 104  CO2 22 - 32 mmol/L 25 20(L) 24  Calcium 8.9 - 10.3 mg/dL 11.7(H) 9.5 9.0  Total Protein 6.5 - 8.1 g/dL - - -  Total Bilirubin 0.3 - 1.2 mg/dL - - -  Alkaline Phos 38 - 126 U/L - - -  AST 15 - 41 U/L - - -  ALT 0 - 44 U/L - - -    DIAGNOSTIC IMAGING:  I have independently reviewed the relevant imaging and discussed with the patient.  ASSESSMENT: 1. Myelodysplastic syndrome with pancytopenia: -Bone marrow biopsy on 12/08/2016 in [place], [place] showed normocellular marrow with trilineage hematopoiesis with erythroid predominance and dysmegakaryopoiesis.Blasts were not increased. Cytogenetics showed 46, XY,del(12)(p.2p13)[8]/46,XY[12]. FISH was positive for 12p-. - Bone marrow biopsy and testing in 2018 wasconsistent with myelodysplastic syndrome with single lineage dysplasia (MDS-SLD),IPSS-Rscore of 2, consistent with low risk category -  Repeat bone marrow biopsy performed on 08/13/2019 by Dr. Delton Coombes with the following results:  Hypercellular bone marrow with dyspoietic changes  Bone marrow aspirate with 5% blastic cells of granulocytic precursors  Flow pathology with 3% blasts  Peripheral blood with 2% circulating blasts  Cytogenetic analysis show del(12p) and 13 of the examined cells with remaining 7 cells showing normal chromosome  Karyotype: 46,XY,del(12)(p13p11.2)[13]/46,XY[7]  FISH analysis was normal - Based on most recent bone marrow biopsy (08/04/2020), patient's IPSS-R is 5, showing that he has progressed to the high risk category - He has required multiple blood transfusions over the past 6 months, most recently on 08/09/2020 (Hgb 7.1) - No improvement on monthly Aranesp or on weekly Retacrit injections (started on 07/10/2020) - Stool occult blood cards x3 were negative  PLAN:  1.  Myelodysplastic syndrome (high risk) with pancytopenia  - Due to IPSS-R score of 5, patient is high risk and treatment is  recommended - Discussed prognosis with patient - median survival high risk patients in the absence of therapy is 1.6 years, and 25% of patients progressed to AML within 1.4 years - Recommended patient to start on azacitidine in light of the above - risks and benefits discussed extensively between patient and Dr. Delton Coombes, patient agrees to start treatment - We will discontinue Retacrit due to lack of response -Will order InteliGEN Myeloid panel for further analysis and prognostication  - We will continue to monitor blood counts closely with CBC - transfuse if Hgb < 7.0 (asymtpomatic) or if Hgb < 8.0 (symptomatic)  -  Check ferritin and iron panel Q3 months due to risk for transfusion-related iron overload - Will set patient up to start treatment next Monday (09/16/20) for Azacitidine given days 1-7, every 28 days  PLAN SUMMARY & DISPOSITION: - Labs (CBC, CMP, LDH, Ferritin, iron/TIBC, IntelliGEN Myeloid panel) on 09/16/20 - Start azacitidine treatment on 09/16/20 - Pre-treatment visit on 09/16/20  All questions were answered. The patient knows to call the clinic with any problems, questions or concerns.  Medical decision making: Moderate  Time spent on visit: I spent 40 minutes counseling the patient face to face. The total time spent in the appointment was 55 minutes and more than 50% was on counseling.  I, Tarri Abernethy PA-C, have seen this patient in conjunction with Dr. Derek Jack. Greater than 50% of visit was performed by Dr. Delton Coombes.  Addendum: I have independently interviewed and assessed this patient.  I talked to him about the bone marrow biopsy results and need for further treatment as his anemia is not responding to erythropoiesis stimulating agents.  We discussed  bone marrow biopsy findings and prognosis in detail.  I have recommended treatment with hypomethylating agents at this time.  We talked about side effects and the need to continue for at least 4-6 cycles to  see if it is working.  I have also talked to patient's daughter Stanton Kidney) in [place].  She is concerned that he needs somebody to check on him on a daily basis at home.  We have also discussed assisted living facility option.  She thought her father would not agree to live at assisted living facility.  We will try to see if we can get home health to check on him few times a week.  We also talked about need for port placement but would continue first cycle without port.  We will also plan to send myeloid panel for NGS testing.   Derek Jack, MD  09/13/20 4:40 PM

## 2020-09-04 NOTE — Progress Notes (Signed)
START ON PATHWAY REGIMEN - MDS     A cycle is every 28 days:     Azacitidine   **Always confirm dose/schedule in your pharmacy ordering system**  Patient Characteristics: Higher-Risk (IPSS-R Score > 3.5), First Line, Not a Transplant Candidate WHO Disease Classification: MDS-EB1 Bone Marrow Blasts (percent): 5% - 10% Cytogenetic Category: Good Platelets (x 10^9/L): 50 to < 100 Absolute Neutrophil Count (x 10^9/L): ? 0.8 Line of Therapy: First Line IPSS-R Risk Category: High IPSS-R Risk Score: 5 Check here if patient's risk score was calculated prior to the International Prognostic Scoring System-Revised (IPSS-R): false Hemoglobin (g/dl): < 8 Patient Characteristics: Not a Transplant Candidate Intent of Therapy: Non-Curative / Palliative Intent, Discussed with Patient

## 2020-09-09 LAB — SURGICAL PATHOLOGY

## 2020-09-13 MED ORDER — PROCHLORPERAZINE MALEATE 10 MG PO TABS: 10.0000 mg | ORAL_TABLET | Freq: Four times a day (QID) | ORAL | 3 refills | Status: DC | PRN

## 2020-09-13 NOTE — Patient Instructions (Addendum)
[name] are diagnosed with myelodysplastic syndrome.  You will be treated in the clinic on days 1-7 (Monday-Friday, then return Monday and Tuesday of the following week).  That is considered one cycle of chemotherapy will repeat every 28 days (from the first day of each treatment).  The drug we will give you is called azacitadine (Vidaza).  The intent of treatment is to control this disease, prevent it from worsening, and to alleviate any symptoms you may be having related to this disease.  You will see the doctor regularly throughout treatment.  We will obtain blood work from you on the first day of treatment and the fifth day of treatment each cycle to monitor your results to make sure it is safe to give your treatment. The doctor monitors your response to treatment by the way you are feeling, your blood work, and by obtaining scans periodically.  There will be wait times while you are here for treatment.  It will take about 30 minutes to 1 hour for your lab work to result.  Then there will be wait times while pharmacy mixes your medications.     Medications you will receive in the clinic prior to your chemotherapy medications:  Aloxi:  ALOXI is used in adults to help prevent nausea and vomiting that happens with certain chemotherapy drugs.  Aloxi is a long acting medication, and will remain in your system for about two days.   Dexamethasone:  This is a steroid given prior to chemotherapy to help prevent allergic reactions; it may also help prevent and control nausea and diarrhea.     Azacitidine (Vidaza)  About This Drug Azacitidine is used to treat cancer. It is given in the vein (IV).  It will take 15 minutes to infuse.    Possible Side Effects . Bone marrow suppression. This is a decrease in the number of white blood cells, red blood cells, and platelets. This may raise your risk of infection, make you tired and weak, and raise your risk of  bleeding.  . Fever and chills  . Nausea and vomiting (throwing up)  . Constipation (not able to move bowels)  . Diarrhea (loose bowel movements)  . Decreased potassium  . Weakness  . Bruising  . Skin and tissue irritation including redness, pain, warmth, or swelling at the injection site.  Marland Kitchen Petechiae. Tiny red spots on the skin, often from low platelets.  Note: Each of the side effects above was reported in 30% or greater of patients treated with azacitidine. Not all possible side effects are included above.   Warnings and Precautions  . Risk of changes in your liver function if you have underlying liver disease.  . Changes in your kidney function, which can cause kidney failure and be life-threatening.  . Tumor lysis syndrome which can be life-threatening: This drug may act on the cancer cells very quickly. This may affect how your kidneys work.   Important Information  . This drug may be present in the saliva, tears, sweat, urine, stool, vomit, semen, and vaginal secretions. Talk to your doctor and/or your nurse about the necessary precautions to take during this time.   Treating Side Effects . Manage tiredness by pacing your activities for the day.  . Be sure to include periods of rest between energy-draining activities.  . To help decrease the risk of infections, wash your hands regularly.  . Avoid close contact with people who have a cold, the flu,  or other infections.  . Take your temperature as your doctor or nurse tells you, and whenever you feel like you may have a fever.  . To help decrease the risk of bleeding, use a soft toothbrush. Check with your nurse before using dental floss.  . Be very careful when using knives or tools.  . Use an electric shaver instead of a razor.  . Drink plenty of fluids (a minimum of eight glasses per day is recommended).  . If you throw up or have diarrhea, you should drink more fluids so that you do not become  dehydrated (lack of water in the body from losing too much fluid).  . To help with nausea and vomiting, eat small, frequent meals instead of three large meals a day. Choose foods and drinks that are at room temperature. Ask your nurse or doctor about other helpful tips and medicine that is available to help stop or lessen these symptoms.  . If you have diarrhea, eat low-fiber foods that are high in protein and calories and avoid foods that can irritate your digestive tracts or lead to cramping.  . If you are not able to move your bowels, check with your doctor or nurse before you use enemas, laxatives, or suppositories.  . Ask your nurse or doctor about medicine that can lessen or stop your diarrhea and/or constipation.  Food and Drug Interactions . There are no known interactions of azacitidine with food.  . This drug may interact with other medicines. Tell your doctor and pharmacist about all the medicines and dietary supplements (vitamins, minerals, herbs, and others) that you are taking at this time. Also, check with your doctor or pharmacist before starting any new prescription or over-the-counter medicines, or dietary supplements to make sure that there are no interactions.  When to Call the Doctor  Call your doctor or nurse if you have any of these symptoms and/or any new or unusual symptoms: . Fever of 100.4 F (38 C) or higher  . Chills  . Tiredness that interferes with your daily activities  . Feeling dizzy or lightheaded  . Easy bleeding or bruising  . Nausea that stops you from eating or drinking and/or is not relieved by prescribed medicines  . Throwing up  . Diarrhea, 4 times in one day or diarrhea with lack of strength or a feeling of being dizzy  . No bowel movement in 3 days or when you feel uncomfortable  . Pain, redness, or swelling at the site of the injection  . Any new tiny red spots on the skin  . Decreased and/or dark urine  . Signs of possible  liver problems: dark urine, pale bowel movements, pain in your abdomen, feeling very tired and weak, unusual itching, or yellowing of the eyes or skin  . Signs of tumor lysis: confusion or agitation, decreased urine, nausea/vomiting, diarrhea, muscle cramping, numbness and/or tingling, seizures  . If you think you may be pregnant or may have impregnated your partner  Reproduction Warnings . Pregnancy warning: This drug can have harmful effects on the unborn baby. Women of childbearing potential and men with male partners of childbearing potential should use effective methods of birth control during your cancer treatment. Let your doctor know right away if you think you may be pregnant or may have impregnated your partner.  . Breastfeeding warning: It is not known if this drug passes into breast milk. Women should not breastfeed during treatment because this drug could enter the breast milk  and cause harm to a breastfeeding baby.  . Fertility warning: In men and women both, this drug may affect your ability to have children in the future. Talk with your doctor or nurse if you plan to have children. Ask for information on sperm or egg banking.   SELF CARE ACTIVITIES WHILE RECEIVING CHEMOTHERAPY:  Hydration Increase your fluid intake 48 hours prior to treatment and drink at least 8 to 12 cups (64 ounces) of water/decaffeinated beverages per day after treatment. You can still have your cup of coffee or soda but these beverages do not count as part of your 8 to 12 cups that you need to drink daily. No alcohol intake.  Medications Continue taking your normal prescription medication as prescribed.  If you start any new herbal or new supplements please let us know first to make sure it is safe.  Mouth Care Have teeth cleaned professionally before starting treatment. Keep dentures and partial plates clean. Use soft toothbrush and do not use mouthwashes that contain alcohol. Biotene is a good  mouthwash that is available at most pharmacies or may be ordered by calling [phone]. Use warm salt water gargles (1 teaspoon salt per 1 quart warm water) before and after meals and at bedtime. If you need dental work, please let the doctor know before you go for your appointment so that we can coordinate the best possible time for you in regards to your chemo regimen. You need to also let your dentist know that you are actively taking chemo. We may need to do labs prior to your dental appointment.  Skin Care Always use sunscreen that has not expired and with SPF (Sun Protection Factor) of 50 or higher. Wear hats to protect your head from the sun. Remember to use sunscreen on your hands, ears, face, & feet.  Use good moisturizing lotions such as udder cream, eucerin, or even Vaseline. Some chemotherapies can cause dry skin, color changes in your skin and nails.    . Avoid long, hot showers or baths. . Use gentle, fragrance-free soaps and laundry detergent. . Use moisturizers, preferably creams or ointments rather than lotions because the thicker consistency is better at preventing skin dehydration. Apply the cream or ointment within 15 minutes of showering. Reapply moisturizer at night, and moisturize your hands every time after you wash them.  Hair Loss (if your doctor says your hair will fall out)  . If your doctor says that your hair is likely to fall out, decide before you begin chemo whether you want to wear a wig. You may want to shop before treatment to match your hair color. . Hats, turbans, and scarves can also camouflage hair loss, although some people prefer to leave their heads uncovered. If you go bare-headed outdoors, be sure to use sunscreen on your scalp. . Cut your hair short. It eases the inconvenience of shedding lots of hair, but it also can reduce the emotional impact of watching your hair fall out. . Don't perm or color your hair during chemotherapy. Those chemical  treatments are already damaging to hair and can enhance hair loss. Once your chemo treatments are done and your hair has grown back, it's OK to resume dyeing or perming hair.  With chemotherapy, hair loss is almost always temporary. But when it grows back, it may be a different color or texture. In older adults who still had hair color before chemotherapy, the new growth may be completely gray.  Often, new hair is very fine  and soft.  Infection Prevention Please wash your hands for at least 30 seconds using warm soapy water. Handwashing is the #1 way to prevent the spread of germs. Stay away from sick people or people who are getting over a cold. If you develop respiratory systems such as green/yellow mucus production or productive cough or persistent cough let us know and we will see if you need an antibiotic. It is a good idea to keep a pair of gloves on when going into grocery stores/[employer] to decrease your risk of coming into contact with germs on the carts, etc. Carry alcohol hand gel with you at all times and use it frequently if out in public. If your temperature reaches 100.4 or higher please call the clinic and let us know.  If it is after hours or on the weekend please go to the ER if your temperature is over 100.4.  Please have your own personal thermometer at home to use.    Sex and bodily fluids If you are going to have sex, a condom must be used to protect the person that isn't taking chemotherapy. Chemo can decrease your libido (sex drive). For a few days after chemotherapy, chemotherapy can be excreted through your bodily fluids.  When using the toilet please close the lid and flush the toilet twice.  Do this for a few day after you have had chemotherapy.   Effects of chemotherapy on your sex life Some changes are simple and won't last long. They won't affect your sex life permanently.  Sometimes you may feel: . too tired . not strong enough to be very active . sick or sore  . not  in the mood . anxious or low  Your anxiety might not seem related to sex. For example, you may be worried about the cancer and how your treatment is going. Or you may be worried about money, or about how you family are coping with your illness.  These things can cause stress, which can affect your interest in sex. It's important to talk to your partner about how you feel.  Remember - the changes to your sex life don't usually last long. There's usually no medical reason to stop having sex during chemo. The drugs won't have any long term physical effects on your performance or enjoyment of sex. Cancer can't be passed on to your partner during sex  Contraception It's important to use reliable contraception during treatment. Avoid getting pregnant while you or your partner are having chemotherapy. This is because the drugs may harm the baby. Sometimes chemotherapy drugs can leave a man or woman infertile.  This means you would not be able to have children in the future. You might want to talk to someone about permanent infertility. It can be very difficult to learn that you may no longer be able to have children. Some people find counselling helpful. There might be ways to preserve your fertility, although this is easier for men than for women. You may want to speak to a fertility expert. You can talk about sperm banking or harvesting your eggs. You can also ask about other fertility options, such as donor eggs. If you have or have had breast cancer, your doctor might advise you not to take the contraceptive pill. This is because the hormones in it might affect the cancer. It is not known for sure whether or not chemotherapy drugs can be passed on through semen or secretions from the vagina. Because of this some doctors  advise people to use a barrier method if you have sex during treatment. This applies to vaginal, anal or oral sex. Generally, doctors advise a barrier method only for the time you are actually  having the treatment and for about a week after your treatment. Advice like this can be worrying, but this does not mean that you have to avoid being intimate with your partner. You can still have close contact with your partner and continue to enjoy sex.  Animals If you have cats or birds we just ask that you not change the litter or change the cage.  Please have someone else do this for you while you are on chemotherapy.   Food Safety During and After Cancer Treatment Food safety is important for people both during and after cancer treatment. Cancer and cancer treatments, such as chemotherapy, radiation therapy, and stem cell/bone marrow transplantation, often weaken the immune system. This makes it harder for your body to protect itself from foodborne illness, also called food poisoning. Foodborne illness is caused by eating food that contains harmful bacteria, parasites, or viruses.  Foods to avoid Some foods have a higher risk of becoming tainted with bacteria. These include: Marland Kitchen Unwashed fresh fruit and vegetables, especially leafy vegetables that can hide dirt and other contaminants . Raw sprouts, such as alfalfa sprouts . Raw or undercooked beef, especially ground beef, or other raw or undercooked meat and poultry . Fatty, fried, or spicy foods immediately before or after treatment.  These can sit heavy on your stomach and make you feel nauseous. . Raw or undercooked shellfish, such as oysters. . Sushi and sashimi, which often contain raw fish.  . Unpasteurized beverages, such as unpasteurized fruit juices, raw milk, raw yogurt, or cider . Undercooked eggs, such as soft boiled, over easy, and poached; raw, unpasteurized eggs; or foods made with raw egg, such as homemade raw cookie dough and homemade mayonnaise  Simple steps for food safety  Shop smart. . Do not buy food stored or displayed in an unclean area. . Do not buy bruised or damaged fruits or vegetables. . Do not buy cans that  have cracks, dents, or bulges. . Pick up foods that can spoil at the end of your shopping trip and store them in a cooler on the way home.  Prepare and clean up foods carefully. . Rinse all fresh fruits and vegetables under running water, and dry them with a clean towel or paper towel. . Clean the top of cans before opening them. . After preparing food, wash your hands for 20 seconds with hot water and soap. Pay special attention to areas between fingers and under nails. . Clean your utensils and dishes with hot water and soap. Marland Kitchen Disinfect your kitchen and cutting boards using 1 teaspoon of liquid, unscented bleach mixed into 1 quart of water.    Dispose of old food. . Eat canned and packaged food before its expiration date (the "use by" or "best before" date). . Consume refrigerated leftovers within 3 to 4 days. After that time, throw out the food. Even if the food does not smell or look spoiled, it still may be unsafe. Some bacteria, such as Listeria, can grow even on foods stored in the refrigerator if they are kept for too long.  Take precautions when eating out. . At restaurants, avoid buffets and salad bars where food sits out for a long time and comes in contact with many people. Food can become contaminated when someone with a  virus, often a norovirus, or another "bug" handles it. . Put any leftover food in a "to-go" container yourself, rather than having the server do it. And, refrigerate leftovers as soon as you get home. . Choose restaurants that are clean and that are willing to prepare your food as you order it cooked.   AT HOME MEDICATIONS:                                                                                                                                                                Compazine/Prochlorperazine 10mg  tablet. Take 1 tablet every 6 hours as needed for nausea/vomiting. (This can make you sleepy)   EMLA cream. Apply a quarter size amount to port site 1  hour prior to chemo. Do not rub in. Cover with plastic wrap.    Diarrhea Sheet   If you are having loose stools/diarrhea, please purchase Imodium and begin taking as outlined:  At the first sign of poorly formed or loose stools you should begin taking Imodium (loperamide) 2 mg capsules.  Take two tablets (4mg ) followed by one tablet (2mg ) every 2 hours - DO NOT EXCEED 8 tablets in 24 hours.  If it is bedtime and you are having loose stools, take 2 tablets at bedtime, then 2 tablets every 4 hours until morning.   Always call the [facility] if you are having loose stools/diarrhea that you can't get under control.  Loose stools/diarrhea leads to dehydration (loss of water) in your body.  We have other options of trying to get the loose stools/diarrhea to stop but you must let us know!   Constipation Sheet  Colace - 100 mg capsules - take 2 capsules daily.  If this doesn't help then you can increase to 2 capsules twice daily.  Please call if the above does not work for you. Do not go more than 2 days without a bowel movement.  It is very important that you do not become constipated.  It will make you feel sick to your stomach (nausea) and can cause abdominal pain and vomiting.  Nausea Sheet   Compazine/Prochlorperazine 10mg  tablet. Take 1 tablet every 6 hours as needed for nausea/vomiting (This can make you drowsy).  If you are having persistent nausea (nausea that does not stop) please call the [facility] and let us know the amount of nausea that you are experiencing.  If you begin to vomit, you need to call the [facility] and if it is the weekend and you have vomited more than one time and can't get it to stop-go to the Emergency Room.  Persistent nausea/vomiting can lead to dehydration (loss of fluid in your body) and will make you feel very weak and unwell. Ice chips, sips of clear liquids, foods that are  at room temperature, crackers, and toast tend to be better tolerated.   SYMPTOMS  TO REPORT AS SOON AS POSSIBLE AFTER TREATMENT:  FEVER GREATER THAN 100.4 F  CHILLS WITH OR WITHOUT FEVER  NAUSEA AND VOMITING THAT IS NOT CONTROLLED WITH YOUR NAUSEA MEDICATION  UNUSUAL SHORTNESS OF BREATH  UNUSUAL BRUISING OR BLEEDING  TENDERNESS IN MOUTH AND THROAT WITH OR WITHOUT PRESENCE OF ULCERS  URINARY PROBLEMS  BOWEL PROBLEMS  UNUSUAL RASH      Wear comfortable clothing and clothing appropriate for easy access to any Portacath or PICC line. Let us know if there is anything that we can do to make your therapy better!    What to do if you need assistance after hours or on the weekends: CALL [phone].  HOLD on the line, do not hang up.  You will hear multiple messages but at the end you will be connected with a nurse triage line.  They will contact the doctor if necessary.  Most of the time they will be able to assist you.  Do not call the hospital operator.      I have been informed and understand all of the instructions given to me and have received a copy. I have been instructed to call the clinic [phone] or my family physician as soon as possible for continued medical care, if indicated. I do not have any more questions at this time but understand that I may call the [facility] or the Patient Navigator at [phone] during office hours should I have questions or need assistance in obtaining follow-up care.

## 2020-09-16 ENCOUNTER — Inpatient Hospital Stay (HOSPITAL_COMMUNITY): Payer: Medicare Other

## 2020-09-16 ENCOUNTER — Other Ambulatory Visit: Payer: Self-pay

## 2020-09-16 ENCOUNTER — Inpatient Hospital Stay (HOSPITAL_BASED_OUTPATIENT_CLINIC_OR_DEPARTMENT_OTHER): Payer: Medicare Other | Admitting: Hematology

## 2020-09-16 ENCOUNTER — Encounter (HOSPITAL_COMMUNITY): Payer: Self-pay

## 2020-09-16 ENCOUNTER — Inpatient Hospital Stay (HOSPITAL_COMMUNITY): Payer: Medicare Other | Attending: Hematology

## 2020-09-16 VITALS — BP 93/62 | HR 72 | Temp 96.2°F | Resp 18

## 2020-09-16 VITALS — BP 109/57 | HR 75 | Temp 96.8°F | Resp 19 | Wt 134.9 lb

## 2020-09-16 DIAGNOSIS — D469 Myelodysplastic syndrome, unspecified: Secondary | ICD-10-CM

## 2020-09-16 DIAGNOSIS — R55 Syncope and collapse: Secondary | ICD-10-CM | POA: Diagnosis not present

## 2020-09-16 DIAGNOSIS — N1832 Chronic kidney disease, stage 3b: Secondary | ICD-10-CM | POA: Insufficient documentation

## 2020-09-16 DIAGNOSIS — Z79899 Other long term (current) drug therapy: Secondary | ICD-10-CM | POA: Diagnosis not present

## 2020-09-16 DIAGNOSIS — R531 Weakness: Secondary | ICD-10-CM | POA: Diagnosis not present

## 2020-09-16 DIAGNOSIS — D61818 Other pancytopenia: Secondary | ICD-10-CM | POA: Diagnosis not present

## 2020-09-16 DIAGNOSIS — Z87891 Personal history of nicotine dependence: Secondary | ICD-10-CM | POA: Diagnosis not present

## 2020-09-16 LAB — IRON AND TIBC
Iron: 111 ug/dL (ref 45–182)
Saturation Ratios: 47 % — ABNORMAL HIGH (ref 17.9–39.5)
TIBC: 234 ug/dL — ABNORMAL LOW (ref 250–450)
UIBC: 123 ug/dL

## 2020-09-16 LAB — COMPREHENSIVE METABOLIC PANEL
ALT: 12 U/L (ref 0–44)
AST: 16 U/L (ref 15–41)
Albumin: 3.7 g/dL (ref 3.5–5.0)
Alkaline Phosphatase: 58 U/L (ref 38–126)
Anion gap: 5 (ref 5–15)
BUN: 35 mg/dL — ABNORMAL HIGH (ref 8–23)
CO2: 24 mmol/L (ref 22–32)
Calcium: 9.7 mg/dL (ref 8.9–10.3)
Chloride: 109 mmol/L (ref 98–111)
Creatinine, Ser: 1.68 mg/dL — ABNORMAL HIGH (ref 0.61–1.24)
GFR, Estimated: 39 mL/min — ABNORMAL LOW (ref >60)
Glucose, Bld: 83 mg/dL (ref 70–99)
Potassium: 4.3 mmol/L (ref 3.5–5.1)
Sodium: 138 mmol/L (ref 135–145)
Total Bilirubin: 0.8 mg/dL (ref 0.3–1.2)
Total Protein: 7 g/dL (ref 6.5–8.1)

## 2020-09-16 LAB — CBC WITH DIFFERENTIAL/PLATELET
Abs Immature Granulocytes: 0.03 10*3/uL (ref 0.00–0.07)
Basophils Absolute: 0 10*3/uL (ref 0.0–0.1)
Basophils Relative: 0 %
Eosinophils Absolute: 0 10*3/uL (ref 0.0–0.5)
Eosinophils Relative: 0 %
HCT: 26.2 % — ABNORMAL LOW (ref 39.0–52.0)
Hemoglobin: 7.7 g/dL — ABNORMAL LOW (ref 13.0–17.0)
Immature Granulocytes: 1 %
Lymphocytes Relative: 27 %
Lymphs Abs: 0.6 10*3/uL — ABNORMAL LOW (ref 0.7–4.0)
MCH: 24.6 pg — ABNORMAL LOW (ref 26.0–34.0)
MCHC: 29.4 g/dL — ABNORMAL LOW (ref 30.0–36.0)
MCV: 83.7 fL (ref 80.0–100.0)
Monocytes Absolute: 0.2 10*3/uL (ref 0.1–1.0)
Monocytes Relative: 8 %
Neutro Abs: 1.4 10*3/uL — ABNORMAL LOW (ref 1.7–7.7)
Neutrophils Relative %: 64 %
Platelets: 80 10*3/uL — ABNORMAL LOW (ref 150–400)
RBC: 3.13 MIL/uL — ABNORMAL LOW (ref 4.22–5.81)
RDW: 19.4 % — ABNORMAL HIGH (ref 11.5–15.5)
WBC: 2.2 10*3/uL — ABNORMAL LOW (ref 4.0–10.5)
nRBC: 0 % (ref 0.0–0.2)

## 2020-09-16 LAB — FERRITIN: Ferritin: 332 ng/mL (ref 24–336)

## 2020-09-16 LAB — LACTATE DEHYDROGENASE: LDH: 131 U/L (ref 98–192)

## 2020-09-16 MED ORDER — SODIUM CHLORIDE 0.9 % IV SOLN
50.0000 mg/m2 | Freq: Once | INTRAVENOUS | Status: AC
  Administered 2020-09-16: 90 mg via INTRAVENOUS
  Filled 2020-09-16: qty 9

## 2020-09-16 MED ORDER — PALONOSETRON HCL INJECTION 0.25 MG/5ML
0.2500 mg | Freq: Once | INTRAVENOUS | Status: AC
  Administered 2020-09-16: 0.25 mg via INTRAVENOUS
  Filled 2020-09-16: qty 5

## 2020-09-16 MED ORDER — SODIUM CHLORIDE 0.9 % IV SOLN: Freq: Once | INTRAVENOUS | Status: AC

## 2020-09-16 MED ORDER — SODIUM CHLORIDE 0.9 % IV SOLN
10.0000 mg | Freq: Once | INTRAVENOUS | Status: AC
  Administered 2020-09-16: 10 mg via INTRAVENOUS
  Filled 2020-09-16: qty 10

## 2020-09-16 MED ORDER — SODIUM CHLORIDE 0.9% FLUSH
10.0000 mL | INTRAVENOUS | Status: DC | PRN
  Administered 2020-09-16: 10 mL

## 2020-09-16 NOTE — Progress Notes (Signed)
Chemotherapy education packet given and discussed with pt in detail. Discussed diagnosis and staging, tx regimen, and intent of tx.  Reviewed chemotherapy medications and side effects, as well as pre-medications. Instructed on how to manage side effects at home, and when to call the clinic. Importance of fever/chills discussed with pt. Discussed precautions to implement at home after receiving tx, as well as self care strategies. Phone numbers provided for clinic during regular working hours, also how to reach the clinic after hours and on weekends. Pt and family provided the opportunity to ask questions - all questions answered to pt's satisfaction.

## 2020-09-16 NOTE — Patient Instructions (Signed)
[facility]  Discharge Instructions: Thank you for choosing [facility] to provide your oncology and hematology care.  If you have a lab appointment with the Cancer Center, please come in thru the Main Entrance and check in at the main information desk.  Wear comfortable clothing and clothing appropriate for easy access to any Portacath or PICC line.   We strive to give you quality time with your provider. You may need to reschedule your appointment if you arrive late (15 or more minutes).  Arriving late affects you and other patients whose appointments are after yours.  Also, if you miss three or more appointments without notifying the office, you may be dismissed from the clinic at the provider's discretion.      For prescription refill requests, have your pharmacy contact our office and allow 72 hours for refills to be completed.    Today you received the following chemotherapy and/or immunotherapy agents Vidaza      To help prevent nausea and vomiting after your treatment, we encourage you to take your nausea medication as directed.  BELOW ARE SYMPTOMS THAT SHOULD BE REPORTED IMMEDIATELY: *FEVER GREATER THAN 100.4 F (38 C) OR HIGHER *CHILLS OR SWEATING *NAUSEA AND VOMITING THAT IS NOT CONTROLLED WITH YOUR NAUSEA MEDICATION *UNUSUAL SHORTNESS OF BREATH *UNUSUAL BRUISING OR BLEEDING *URINARY PROBLEMS (pain or burning when urinating, or frequent urination) *BOWEL PROBLEMS (unusual diarrhea, constipation, pain near the anus) TENDERNESS IN MOUTH AND THROAT WITH OR WITHOUT PRESENCE OF ULCERS (sore throat, sores in mouth, or a toothache) UNUSUAL RASH, SWELLING OR PAIN  UNUSUAL VAGINAL DISCHARGE OR ITCHING   Items with * indicate a potential emergency and should be followed up as soon as possible or go to the Emergency Department if any problems should occur.  Please show the CHEMOTHERAPY ALERT CARD or IMMUNOTHERAPY ALERT CARD at check-in to the Emergency  Department and triage nurse.  Should you have questions after your visit or need to cancel or reschedule your appointment, please contact [facility] [phone]  and follow the prompts.  Office hours are 8:00 a.m. to 4:30 p.m. Monday - Friday. Please note that voicemails left after 4:00 p.m. may not be returned until the following business day.  We are closed weekends and major holidays. You have access to a nurse at all times for urgent questions. Please call the main number to the clinic [phone] and follow the prompts.  For any non-urgent questions, you may also contact your provider using MyChart. We now offer e-Visits for anyone 18 and older to request care online for non-urgent symptoms. For details visit [email or web address].   Also download the MyChart app! Go to the app store, search "MyChart", open the app, select [facility], and log in with your MyChart username and password.  Due to Covid, a mask is required upon entering the hospital/clinic. If you do not have a mask, one will be given to you upon arrival. For doctor visits, patients may have 1 support person aged 18 or older with them. For treatment visits, patients cannot have anyone with them due to current Covid guidelines and our immunocompromised population.  

## 2020-09-16 NOTE — [facility] Notes (Signed)
[name], [place] [id number]   CLINIC:  Medical Oncology/Hematology  PCP:  Sharilyn Sites, [place] [address] [phone]   REASON FOR VISIT:  Follow-up for MDS  PRIOR THERAPY: Aranesp, Retacrit, intermittent PRBC transfusions  CURRENT THERAPY: Azacitidine (initiated on 09/16/2020)  INTERVAL HISTORY:  [name] 87 y.o. male returns for routine follow-up of his high-risk myelodysplastic syndrome.  He is here today to begin azacitidine treatment.  He was last seen by Dr. Delton Coombes and Tarri Abernethy PA-C 09/04/2020.  Labs today show hemoglobin 7.7, WBC 2.2 with ANC 1.4, platelets 80.  Iron panel was checked to evaluate any iron overload in the setting of multiple transfusions, which showed normal ferritin 332 and elevated iron saturation 47%.  LDH normal 131, creatinine at baseline 1.68.  At today's visit, [name] continues to report progressive fatigue and dyspnea on exertion.  He denies any presyncopal episodes this week.  No chest pain or palpitations.  He reports that he is managing his ADLs at home, but that he has to stop frequently to rest.  He is agreeable to having a home health RN, and check on him a few times per week.  He does not feel that he needs assisted living at this time.   No recent infections, and no current signs or symptoms of infectious systemic illness.  No fever, chills, night sweats.  He continues to report easy bruising of his thrombocytopenia, but he denies signs or symptoms of bleeding.  No epistaxis, hemoptysis, hematemesis, hematochezia, melena, hematuria.  He has 25% energy and 25% appetite. He endorses that he is maintaining a stable weight but forcing himself to eat.    REVIEW OF SYSTEMS:  Review of Systems  Constitutional: Positive for appetite change and fatigue. Negative for chills, diaphoresis, fever and unexpected weight change.  HENT:   Negative for lump/mass and  nosebleeds.   Eyes: Negative for eye problems.  Respiratory: Positive for shortness of breath (With exertion). Negative for cough and hemoptysis.   Cardiovascular: Negative for chest pain, leg swelling and palpitations.  Gastrointestinal: Negative for abdominal pain, blood in stool, constipation, diarrhea, nausea and vomiting.  Genitourinary: Negative for hematuria.   Skin: Negative.   Neurological: Negative for dizziness, headaches and light-headedness.  Hematological: Does not bruise/bleed easily.      PAST MEDICAL/SURGICAL HISTORY:  Past Medical History:  Diagnosis Date  . Bone cancer ([facility])   . Chronic kidney insufficiency   . COPD (chronic obstructive pulmonary disease) ([facility])   . GERD (gastroesophageal reflux disease)   . Pulmonary nodule    Past Surgical History:  Procedure Laterality Date  . BACK SURGERY    . CATARACT EXTRACTION W/PHACO Right 03/09/2016   Procedure: CATARACT EXTRACTION PHACO AND INTRAOCULAR LENS PLACEMENT RIGHT EYE CDE=8.58;  Surgeon: Tonny Branch, MD;  Location: [facility];  Service: Ophthalmology;  Laterality: Right;  right  . CATARACT EXTRACTION W/PHACO Left 04/13/2016   Procedure: CATARACT EXTRACTION PHACO AND INTRAOCULAR LENS PLACEMENT (IOC);  Surgeon: Tonny Branch, MD;  Location: [facility];  Service: Ophthalmology;  Laterality: Left;  CDE: 8.08  . KYPHOPLASTY Bilateral 06/09/2019   Procedure: T11 KYPHOPLASTY;  Surgeon: Consuella Lose, MD;  Location: [facility];  Service: Neurosurgery;  Laterality: Bilateral;  . None to Date  10/01/15     SOCIAL HISTORY:  Social History   Socioeconomic History  . Marital status: Widowed    Spouse name: Not on file  . Number of children: Not on  file  . Years of education: Not on file  . Highest education level: Not on file  Occupational History  . Not on file  Tobacco Use  . Smoking status: Former Smoker    Years: 15.00    Types: Cigarettes, Cigars    Quit date: 10/01/1990    Years since quitting: 29.9  . Smokeless  tobacco: Never Used  . Tobacco comment: Quit x 25-30 years; 2 cigars daily when smoked  Vaping Use  . Vaping Use: Never used  Substance and Sexual Activity  . Alcohol use: Not Currently    Alcohol/week: 0.0 standard drinks  . Drug use: No  . Sexual activity: Never    Birth control/protection: None  Other Topics Concern  . Not on file  Social History Narrative  . Not on file   Social Determinants of Health   Financial Resource Strain: High Risk  . Difficulty of Paying Living Expenses: Hard  Food Insecurity: No Food Insecurity  . Worried About Charity fundraiser in the Last Year: Never true  . Ran Out of Food in the Last Year: Never true  Transportation Needs: No Transportation Needs  . Lack of Transportation (Medical): No  . Lack of Transportation (Non-Medical): No  Physical Activity: Inactive  . Days of Exercise per Week: 0 days  . Minutes of Exercise per Session: 0 min  Stress: No Stress Concern Present  . Feeling of Stress : Not at all  Social Connections: Moderately Integrated  . Frequency of Communication with Friends and Family: More than three times a week  . Frequency of Social Gatherings with Friends and Family: More than three times a week  . Attends Religious Services: More than 4 times per year  . Active Member of Clubs or Organizations: Yes  . Attends Archivist Meetings: More than 4 times per year  . Marital Status: Widowed  Intimate Partner Violence: Not At Risk  . Fear of Current or Ex-Partner: No  . Emotionally Abused: No  . Physically Abused: No  . Sexually Abused: No    FAMILY HISTORY:  Family History  Problem Relation Age of Onset  . Kidney disease Mother   . Kidney disease Sister   . Colon cancer Neg Hx   . Gastric cancer Neg Hx   . Esophageal cancer Neg Hx     CURRENT MEDICATIONS:  Outpatient Encounter Medications as of 09/16/2020  Medication Sig  . acetaminophen (TYLENOL) 325 MG tablet Take 2 tablets (650 mg total) by mouth every  6 (six) hours as needed for mild pain, fever or headache (or Fever >/= 101).  Marland Kitchen albuterol (VENTOLIN HFA) 108 (90 Base) MCG/ACT inhaler Inhale 2 puffs into the lungs every 4 (four) hours as needed for wheezing or shortness of breath.  . azaCITIDine 5 mg/2 mLs in lactated ringers infusion Inject 75 mg/m2 into the vein daily. Days 1-7 every 28 days  . doxycycline (VIBRA-TABS) 100 MG tablet Take 100 mg by mouth 2 (two) times daily.  Marland Kitchen epoetin alfa (PROCRIT) 38756 UNIT/ML injection Inject 1 mL (10,000 Units total) into the skin once a week. (Patient taking differently: Inject 10,000 Units into the skin every 30 (thirty) days.)  . pantoprazole (PROTONIX) 40 MG tablet Take 40 mg by mouth daily.  . predniSONE (DELTASONE) 5 MG tablet Take 1 tablet (5 mg total) by mouth daily with breakfast for 30 doses.  Marland Kitchen prochlorperazine (COMPAZINE) 10 MG tablet Take 1 tablet (10 mg total) by mouth every 6 (six) hours as  needed for nausea or vomiting.  . tamsulosin (FLOMAX) 0.4 MG CAPS capsule Take 1 capsule (0.4 mg total) by mouth daily after supper.  . tamsulosin (FLOMAX) 0.4 MG CAPS capsule Take 1 capsule by mouth daily.  Marland Kitchen tiotropium (SPIRIVA HANDIHALER) 18 MCG inhalation capsule Place 1 capsule (18 mcg total) into inhaler and inhale daily.   No facility-administered encounter medications on file as of 09/16/2020.    ALLERGIES:  No Known Allergies   PHYSICAL EXAM:  ECOG PERFORMANCE STATUS: 2 - Symptomatic, <50% confined to bed  Vitals:   09/16/20 0827  BP: (!) 109/57  Pulse: 75  Resp: 19  Temp: (!) 96.8 F (36 C)  SpO2: 96%   Filed Weights   09/16/20 0827  Weight: 134 lb 14.4 oz (61.2 kg)   Physical Exam Constitutional:      Appearance: Normal appearance. He is underweight.     Interventions: Nasal cannula in place.  HENT:     Head: Normocephalic and atraumatic.     Mouth/Throat:     Mouth: Mucous membranes are moist.  Eyes:     Extraocular Movements: Extraocular movements intact.     Pupils:  Pupils are equal, round, and reactive to light.  Cardiovascular:     Rate and Rhythm: Normal rate and regular rhythm.     Pulses: Normal pulses.     Heart sounds: Normal heart sounds.  Pulmonary:     Effort: Pulmonary effort is normal.     Breath sounds: Decreased breath sounds present. No wheezing, rhonchi or rales.  Abdominal:     General: Bowel sounds are normal.     Palpations: Abdomen is soft.     Tenderness: There is no abdominal tenderness.  Musculoskeletal:        General: No swelling.     Right lower leg: No edema.     Left lower leg: No edema.  Lymphadenopathy:     Cervical: No cervical adenopathy.  Skin:    General: Skin is warm and dry.     Coloration: Skin is pale.     Findings: Bruising (Bilateral upper and lower extremities) and ecchymosis present.  Neurological:     General: No focal deficit present.     Mental Status: He is alert and oriented to person, place, and time.  Psychiatric:        Mood and Affect: Mood normal.        Behavior: Behavior normal.      LABORATORY DATA:  I have reviewed the labs as listed.  CBC    Component Value Date/Time   WBC 2.2 (L) 09/16/2020 0808   RBC 3.13 (L) 09/16/2020 0808   HGB 7.7 (L) 09/16/2020 0808   HCT 26.2 (L) 09/16/2020 0808   PLT 80 (L) 09/16/2020 0808   MCV 83.7 09/16/2020 0808   MCH 24.6 (L) 09/16/2020 0808   MCHC 29.4 (L) 09/16/2020 0808   RDW 19.4 (H) 09/16/2020 0808   LYMPHSABS 0.6 (L) 09/16/2020 0808   MONOABS 0.2 09/16/2020 0808   EOSABS 0.0 09/16/2020 0808   BASOSABS 0.0 09/16/2020 0808   CMP Latest Ref Rng & Units 09/16/2020 07/27/2020 06/26/2020  Glucose 70 - 99 mg/dL 83 105(H) 99  BUN 8 - 23 mg/dL 35(H) 39(H) 35(H)  Creatinine 0.61 - 1.24 mg/dL 1.68(H) 2.43(H) 1.86(H)  Sodium 135 - 145 mmol/L 138 138 135  Potassium 3.5 - 5.1 mmol/L 4.3 4.2 4.2  Chloride 98 - 111 mmol/L 109 104 107  CO2 22 - 32 mmol/L 24 25  20(L)  Calcium 8.9 - 10.3 mg/dL 9.7 11.7(H) 9.5  Total Protein 6.5 - 8.1 g/dL 7.0 - -   Total Bilirubin 0.3 - 1.2 mg/dL 0.8 - -  Alkaline Phos 38 - 126 U/L 58 - -  AST 15 - 41 U/L 16 - -  ALT 0 - 44 U/L 12 - -    DIAGNOSTIC IMAGING:  I have independently reviewed the relevant imaging and discussed with the patient.  ASSESSMENT: 1. Myelodysplastic syndrome with pancytopenia: -Bone marrow biopsy on 12/08/2016 in [place], [place] showed normocellular marrow with trilineage hematopoiesis with erythroid predominance and dysmegakaryopoiesis.Blasts were not increased. Cytogenetics showed 46, XY,del(12)(p.2p13)[8]/46,XY[12]. FISH was positive for 12p-. - Bone marrow biopsy and testing in 2018 wasconsistent with myelodysplastic syndrome with single lineage dysplasia (MDS-SLD),IPSS-Rscore of 2, consistent with low risk category -  Repeat bone marrow biopsy performed on 08/13/2019 by Dr. Delton Coombes with the following results:  Hypercellular bone marrow with dyspoietic changes  Bone marrow aspirate with 5% blastic cells of granulocytic precursors  Flow pathology with 3% blasts  Peripheral blood with 2% circulating blasts  Cytogenetic analysis show del(12p) and 13 of the examined cells with remaining 7 cells showing normal chromosome  Karyotype: 46,XY,del(12)(p13p11.2)[13]/46,XY[7]  FISH analysis was normal - Based on most recent bone marrow biopsy (08/04/2020), patient's IPSS-R is 5, showing that he has progressed to the high risk category - He has required multiple blood transfusions over the past 6 months, most recently on 08/09/2020 (Hgb 7.1) - No improvement on monthly Aranesp or on weekly Retacrit injections (started on 07/10/2020 - discontinued 09/04/2020) - Stool occult blood cards x3 were negative -Labs today show hemoglobin 7.7, WBC 2.2 with ANC 1.4, platelets 80.  Iron panel was checked to evaluate any iron overload in the setting of multiple transfusions, which showed normal ferritin 332 and elevated iron saturation 47%.  LDH normal 131, creatinine at baseline  1.68.  PLAN:  1.  Myelodysplastic syndrome (high risk) with pancytopenia  - Due to IPSS-R score of 5, patient is high risk and treatment is recommended - Discussed prognosis with patient - median survival of high risk patients in the absence of therapy is 1.6 years, and 25% of patients progressed to AML within 1.4 years -  Started azacitidine on 09/16/2020 - will continue for days 1-5, every 28 days (if patient tolerates 5-day treatment well, will consider progressing to 7-day treatment in the next cycle) -InteliGEN Myeloid panel is pending for further analysis and prognostication  - We will continue to monitor blood counts closely with repeat CBC in 2 weeks - transfuse if Hgb < 7.0 (asymtpomatic) or if Hgb < 8.0 (symptomatic)  - Check ferritin and iron panel again in 3 months (due 12/17/2020) due to risk for transfusion-related iron overload - Referral for home health RN  - Referral for port placement   2. CKD stage 3b -Baseline creatinine 1.3-1.8 - We will monitor throughout treatment and adjust dose as needed   PLAN SUMMARY & DISPOSITION: -Continue azacitidine x5 days, every 28 days - Repeat labs (CBC CMP, LDH) in 2 weeks - Follow-up in 2 weeks - Referral for home health RN - Referral to general surgery for port placement   All questions were answered. The patient knows to call the clinic with any problems, questions or concerns.  Medical decision making: Moderate  Time spent on visit: I spent 20 minutes counseling the patient face to face. The total time spent in the appointment was 30 minutes and more than 50% was on  counseling.   I, Tarri Abernethy PA-C, have seen this patient in conjunction with Dr. Derek Jack. Greater than 50% of visit was performed by Dr. Delton Coombes.  Addendum: I have independently evaluated this patient and performed face-to-face encounter and agree with HPI written by Casey Burkitt, PA-C.  I have also talked to his daughter Stanton Kidney.  We will  start him on azacitidine after discussing the risks and benefits.  We will start him at a lower dose at 50 mg per metered square for 5 days for the first cycle and titrated up as tolerated.  We will obtain needle counts in 2 weeks.  I have also talked to him about need for port placement.  We will make a referral to general surgery.  We will also make a referral for home health.  Derek Jack, MD  09/17/20 7:49 PM

## 2020-09-16 NOTE — Patient Instructions (Signed)
[place] at [facility] Discharge Instructions  You were seen today by Dr. Delton Coombes and Tarri Abernethy PA-C for your myelodysplastic syndrome.  You received your first dose of azacitidine (Vidaza) today.    Call our office at [phone] if you experience any concerning side effects at home, such as severe weakness/fatigue, nausea and vomiting, fever, headache, dizziness, chills, joint pain.  You will continue your azacitidine for the next 5 days this week (Monday 09/16/2020 through Friday, 09/20/2020)  Referral has been sent for home health nurse.  Referral has been sent to general surgery for port insertion.  Further information on chemotherapy will be provided by nurse educator during chemo education class.  Repeat labs in 2 weeks.  Follow-up visit in 2 weeks (same day as labs).  Thank you for choosing [place] at [facility] to provide your oncology and hematology care.  To afford each patient quality time with our provider, please arrive at least 15 minutes before your scheduled appointment time.   If you have a lab appointment with the [facility] please come in thru the Main Entrance and check in at the main information desk.  You need to re-schedule your appointment should you arrive 10 or more minutes late.  We strive to give you quality time with our providers, and arriving late affects you and other patients whose appointments are after yours.  Also, if you no show three or more times for appointments you may be dismissed from the clinic at the providers discretion.     Again, thank you for choosing [facility].  Our hope is that these requests will decrease the amount of time that you wait before being seen by our physicians.       _____________________________________________________________  Should you have questions after your visit to [facility], please contact our office at [phone] and  follow the prompts.  Our office hours are 8:00 a.m. and 4:30 p.m. Monday - Friday.  Please note that voicemails left after 4:00 p.m. may not be returned until the following business day.  We are closed weekends and major holidays.  You do have access to a nurse 24-7, just call the main number to the clinic [phone] and do not press any options, hold on the line and a nurse will answer the phone.    For prescription refill requests, have your pharmacy contact our office and allow 72 hours.    Due to Covid, you will need to wear a mask upon entering the hospital. If you do not have a mask, a mask will be given to you at the Main Entrance upon arrival. For doctor visits, patients may have 1 support person age 100 or older with them. For treatment visits, patients can not have anyone with them due to social distancing guidelines and our immunocompromised population.

## 2020-09-16 NOTE — Progress Notes (Signed)
Ok to proceed with today's labs. ? ?T.O. Dr Katragadda/Kendalynn Wideman, PharmD ?

## 2020-09-16 NOTE — Progress Notes (Signed)
Patient tolerated azacitidine (Vidaza) infusion with no complaints voiced. Side effects with management reviewed understanding verbalized. Peripheral IV site clean and dry with good blood return noted before and after infusion. Band aid applied. VSS with discharge and left in satisfactory condition with no s/s of distress noted.

## 2020-09-17 ENCOUNTER — Inpatient Hospital Stay (HOSPITAL_COMMUNITY): Payer: Medicare Other

## 2020-09-17 VITALS — BP 104/54 | HR 64 | Temp 96.9°F | Resp 18

## 2020-09-17 DIAGNOSIS — D469 Myelodysplastic syndrome, unspecified: Secondary | ICD-10-CM | POA: Diagnosis not present

## 2020-09-17 MED ORDER — DEXAMETHASONE SODIUM PHOSPHATE 100 MG/10ML IJ SOLN
10.0000 mg | Freq: Once | INTRAMUSCULAR | Status: AC
  Administered 2020-09-17: 10 mg via INTRAVENOUS
  Filled 2020-09-17: qty 10

## 2020-09-17 MED ORDER — SODIUM CHLORIDE 0.9 % IV SOLN: Freq: Once | INTRAVENOUS | Status: AC

## 2020-09-17 MED ORDER — SODIUM CHLORIDE 0.9 % IV SOLN
50.0000 mg/m2 | Freq: Once | INTRAVENOUS | Status: AC
  Administered 2020-09-17: 90 mg via INTRAVENOUS
  Filled 2020-09-17: qty 9

## 2020-09-17 NOTE — Progress Notes (Signed)
Patient presents today for Day 2 Vidaza infusion.  Vital signs within parameters for treatment.  Labs drawn 09/16/20 within parameters for treatment.  Patient has no new complaints at this time.  Peripheral IV started and blood return noted pre and post infusion.  Vidaza infusion given today per MD orders.  Stable during infusion without adverse affects.  Vital signs stable.  No complaints at this time.  Discharge from clinic via motorized wheelchair in stable condition.  Alert and oriented X 3.  Follow up with [facility] as scheduled.

## 2020-09-17 NOTE — Patient Instructions (Signed)
[facility]  Discharge Instructions: Thank you for choosing [facility] to provide your oncology and hematology care.  If you have a lab appointment with the Cancer Center, please come in thru the Main Entrance and check in at the main information desk.  Wear comfortable clothing and clothing appropriate for easy access to any Portacath or PICC line.   We strive to give you quality time with your provider. You may need to reschedule your appointment if you arrive late (15 or more minutes).  Arriving late affects you and other patients whose appointments are after yours.  Also, if you miss three or more appointments without notifying the office, you may be dismissed from the clinic at the provider's discretion.      For prescription refill requests, have your pharmacy contact our office and allow 72 hours for refills to be completed.    Today you received the following chemotherapy and/or immunotherapy agents Vidaza      To help prevent nausea and vomiting after your treatment, we encourage you to take your nausea medication as directed.  BELOW ARE SYMPTOMS THAT SHOULD BE REPORTED IMMEDIATELY: *FEVER GREATER THAN 100.4 F (38 C) OR HIGHER *CHILLS OR SWEATING *NAUSEA AND VOMITING THAT IS NOT CONTROLLED WITH YOUR NAUSEA MEDICATION *UNUSUAL SHORTNESS OF BREATH *UNUSUAL BRUISING OR BLEEDING *URINARY PROBLEMS (pain or burning when urinating, or frequent urination) *BOWEL PROBLEMS (unusual diarrhea, constipation, pain near the anus) TENDERNESS IN MOUTH AND THROAT WITH OR WITHOUT PRESENCE OF ULCERS (sore throat, sores in mouth, or a toothache) UNUSUAL RASH, SWELLING OR PAIN  UNUSUAL VAGINAL DISCHARGE OR ITCHING   Items with * indicate a potential emergency and should be followed up as soon as possible or go to the Emergency Department if any problems should occur.  Please show the CHEMOTHERAPY ALERT CARD or IMMUNOTHERAPY ALERT CARD at check-in to the Emergency  Department and triage nurse.  Should you have questions after your visit or need to cancel or reschedule your appointment, please contact [facility] [phone]  and follow the prompts.  Office hours are 8:00 a.m. to 4:30 p.m. Monday - Friday. Please note that voicemails left after 4:00 p.m. may not be returned until the following business day.  We are closed weekends and major holidays. You have access to a nurse at all times for urgent questions. Please call the main number to the clinic [phone] and follow the prompts.  For any non-urgent questions, you may also contact your provider using MyChart. We now offer e-Visits for anyone 18 and older to request care online for non-urgent symptoms. For details visit [email or web address].   Also download the MyChart app! Go to the app store, search "MyChart", open the app, select [place], and log in with your MyChart username and password.  Due to Covid, a mask is required upon entering the hospital/clinic. If you do not have a mask, one will be given to you upon arrival. For doctor visits, patients may have 1 support person aged 18 or older with them. For treatment visits, patients cannot have anyone with them due to current Covid guidelines and our immunocompromised population.  

## 2020-09-18 ENCOUNTER — Other Ambulatory Visit: Payer: Self-pay

## 2020-09-18 ENCOUNTER — Inpatient Hospital Stay (HOSPITAL_COMMUNITY): Payer: Medicare Other

## 2020-09-18 VITALS — BP 124/59 | HR 60 | Temp 97.0°F | Resp 18

## 2020-09-18 DIAGNOSIS — D469 Myelodysplastic syndrome, unspecified: Secondary | ICD-10-CM

## 2020-09-18 MED ORDER — PALONOSETRON HCL INJECTION 0.25 MG/5ML
0.2500 mg | Freq: Once | INTRAVENOUS | Status: AC
Start: 2020-09-18 — End: 2020-09-18
  Administered 2020-09-18: 0.25 mg via INTRAVENOUS

## 2020-09-18 MED ORDER — DEXAMETHASONE SODIUM PHOSPHATE 100 MG/10ML IJ SOLN
10.0000 mg | Freq: Once | INTRAMUSCULAR | Status: AC
  Administered 2020-09-18: 10 mg via INTRAVENOUS
  Filled 2020-09-18: qty 10

## 2020-09-18 MED ORDER — SODIUM CHLORIDE 0.9 % IV SOLN: Freq: Once | INTRAVENOUS | Status: AC

## 2020-09-18 MED ORDER — SODIUM CHLORIDE 0.9 % IV SOLN
50.0000 mg/m2 | Freq: Once | INTRAVENOUS | Status: AC
  Administered 2020-09-18: 90 mg via INTRAVENOUS
  Filled 2020-09-18: qty 9

## 2020-09-18 MED ORDER — PALONOSETRON HCL INJECTION 0.25 MG/5ML
INTRAVENOUS | Status: AC
  Filled 2020-09-18: qty 5

## 2020-09-18 NOTE — Patient Instructions (Signed)
[facility]  Discharge Instructions: Thank you for choosing [facility] to provide your oncology and hematology care.  If you have a lab appointment with the [facility], please come in thru the Main Entrance and check in at the main information desk.  Wear comfortable clothing and clothing appropriate for easy access to any Portacath or PICC line.   We strive to give you quality time with your provider. You may need to reschedule your appointment if you arrive late (15 or more minutes).  Arriving late affects you and other patients whose appointments are after yours.  Also, if you miss three or more appointments without notifying the office, you may be dismissed from the clinic at the provider's discretion.      For prescription refill requests, have your pharmacy contact our office and allow 72 hours for refills to be completed.    Today you received the following chemotherapy and/or immunotherapy agents Vidaza infusion.     To help prevent nausea and vomiting after your treatment, we encourage you to take your nausea medication as directed.  BELOW ARE SYMPTOMS THAT SHOULD BE REPORTED IMMEDIATELY: . *FEVER GREATER THAN 100.4 F (38 C) OR HIGHER . *CHILLS OR SWEATING . *NAUSEA AND VOMITING THAT IS NOT CONTROLLED WITH YOUR NAUSEA MEDICATION . *UNUSUAL SHORTNESS OF BREATH . *UNUSUAL BRUISING OR BLEEDING . *URINARY PROBLEMS (pain or burning when urinating, or frequent urination) . *BOWEL PROBLEMS (unusual diarrhea, constipation, pain near the anus) . TENDERNESS IN MOUTH AND THROAT WITH OR WITHOUT PRESENCE OF ULCERS (sore throat, sores in mouth, or a toothache) . UNUSUAL RASH, SWELLING OR PAIN  . UNUSUAL VAGINAL DISCHARGE OR ITCHING   Items with * indicate a potential emergency and should be followed up as soon as possible or go to the Emergency Department if any problems should occur.  Please show the CHEMOTHERAPY ALERT CARD or IMMUNOTHERAPY ALERT CARD at  check-in to the Emergency Department and triage nurse.  Should you have questions after your visit or need to cancel or reschedule your appointment, please contact [facility] [phone]  and follow the prompts.  Office hours are 8:00 a.m. to 4:30 p.m. Monday - Friday. Please note that voicemails left after 4:00 p.m. may not be returned until the following business day.  We are closed weekends and major holidays. You have access to a nurse at all times for urgent questions. Please call the main number to the clinic [phone] and follow the prompts.  For any non-urgent questions, you may also contact your provider using MyChart. We now offer e-Visits for anyone 24 and older to request care online for non-urgent symptoms. For details visit [email or web address].   Also download the MyChart app! Go to the app store, search "MyChart", open the app, select [place], and log in with your MyChart username and password.  Due to Covid, a mask is required upon entering the hospital/clinic. If you do not have a mask, one will be given to you upon arrival. For doctor visits, patients may have 1 support person aged 10 or older with them. For treatment visits, patients cannot have anyone with them due to current Covid guidelines and our immunocompromised population.

## 2020-09-18 NOTE — Progress Notes (Signed)
Patient presents today for Day 3 Vidaza.  Vital signs within parameters for treatment.  Labs drawn on 09/16/20.  Patient has no new complaints at this time.  Peripheral IV started and blood return noted pre and post infusion.  Vidaza infusion given today per MD orders.  Stable during infusion without adverse affects.  Vital signs stable.  No complaints at this time.  Discharge from clinic via motorized scooter in stable condition.  Alert and oriented X 3.  Follow up with [facility] as scheduled.

## 2020-09-19 ENCOUNTER — Inpatient Hospital Stay (HOSPITAL_COMMUNITY): Payer: Medicare Other

## 2020-09-19 VITALS — BP 101/50 | HR 61 | Temp 96.9°F | Resp 18

## 2020-09-19 DIAGNOSIS — D469 Myelodysplastic syndrome, unspecified: Secondary | ICD-10-CM

## 2020-09-19 MED ORDER — SODIUM CHLORIDE 0.9 % IV SOLN
50.0000 mg/m2 | Freq: Once | INTRAVENOUS | Status: AC
  Administered 2020-09-19: 90 mg via INTRAVENOUS
  Filled 2020-09-19: qty 9

## 2020-09-19 MED ORDER — SODIUM CHLORIDE 0.9 % IV SOLN
10.0000 mg | Freq: Once | INTRAVENOUS | Status: AC
  Administered 2020-09-19: 10 mg via INTRAVENOUS
  Filled 2020-09-19: qty 10

## 2020-09-19 MED ORDER — SODIUM CHLORIDE 0.9 % IV SOLN: Freq: Once | INTRAVENOUS | Status: AC

## 2020-09-19 NOTE — Patient Instructions (Signed)
[place]  Discharge Instructions: Thank you for choosing [facility] to provide your oncology and hematology care.  If you have a lab appointment with the [facility], please come in thru the Main Entrance and check in at the main information desk.  Wear comfortable clothing and clothing appropriate for easy access to any Portacath or PICC line.   We strive to give you quality time with your provider. You may need to reschedule your appointment if you arrive late (15 or more minutes).  Arriving late affects you and other patients whose appointments are after yours.  Also, if you miss three or more appointments without notifying the office, you may be dismissed from the clinic at the provider's discretion.      For prescription refill requests, have your pharmacy contact our office and allow 72 hours for refills to be completed.    Today you received the following chemotherapy and/or immunotherapy agents Vidaza infusion     To help prevent nausea and vomiting after your treatment, we encourage you to take your nausea medication as directed.  BELOW ARE SYMPTOMS THAT SHOULD BE REPORTED IMMEDIATELY: . *FEVER GREATER THAN 100.4 F (38 C) OR HIGHER . *CHILLS OR SWEATING . *NAUSEA AND VOMITING THAT IS NOT CONTROLLED WITH YOUR NAUSEA MEDICATION . *UNUSUAL SHORTNESS OF BREATH . *UNUSUAL BRUISING OR BLEEDING . *URINARY PROBLEMS (pain or burning when urinating, or frequent urination) . *BOWEL PROBLEMS (unusual diarrhea, constipation, pain near the anus) . TENDERNESS IN MOUTH AND THROAT WITH OR WITHOUT PRESENCE OF ULCERS (sore throat, sores in mouth, or a toothache) . UNUSUAL RASH, SWELLING OR PAIN  . UNUSUAL VAGINAL DISCHARGE OR ITCHING   Items with * indicate a potential emergency and should be followed up as soon as possible or go to the Emergency Department if any problems should occur.  Please show the CHEMOTHERAPY ALERT CARD or IMMUNOTHERAPY ALERT CARD at  check-in to the Emergency Department and triage nurse.  Should you have questions after your visit or need to cancel or reschedule your appointment, please contact [facility] [phone]  and follow the prompts.  Office hours are 8:00 a.m. to 4:30 p.m. Monday - Friday. Please note that voicemails left after 4:00 p.m. may not be returned until the following business day.  We are closed weekends and major holidays. You have access to a nurse at all times for urgent questions. Please call the main number to the clinic [phone] and follow the prompts.  For any non-urgent questions, you may also contact your provider using MyChart. We now offer e-Visits for anyone 57 and older to request care online for non-urgent symptoms. For details visit [email or web address].   Also download the MyChart app! Go to the app store, search "MyChart", open the app, select [place] [place], and log in with your MyChart username and password.  Due to Covid, a mask is required upon entering the hospital/clinic. If you do not have a mask, one will be given to you upon arrival. For doctor visits, patients may have 1 support person aged 72 or older with them. For treatment visits, patients cannot have anyone with them due to current Covid guidelines and our immunocompromised population.

## 2020-09-19 NOTE — Progress Notes (Signed)
Patient presents today for day 4 Vidaza infusion.  Vital signs within parameters.  Patient has no new complaints at this time.  Peripheral IV started and blood return noted pre and post infusion.  Vidaza infusion given today per MD orders.  Stable during infusion without adverse affects.  Vital signs stable.  No complaints at this time.  Discharge from clinic via motorized wheelchair in stable condition.  Alert and oriented X 3.  Follow up with [name] as scheduled.

## 2020-09-20 ENCOUNTER — Inpatient Hospital Stay (HOSPITAL_COMMUNITY): Payer: Medicare Other

## 2020-09-20 ENCOUNTER — Other Ambulatory Visit: Payer: Self-pay

## 2020-09-20 VITALS — BP 97/60 | HR 66 | Temp 96.9°F | Resp 18

## 2020-09-20 DIAGNOSIS — D469 Myelodysplastic syndrome, unspecified: Secondary | ICD-10-CM | POA: Diagnosis not present

## 2020-09-20 MED ORDER — PALONOSETRON HCL INJECTION 0.25 MG/5ML
INTRAVENOUS | Status: AC
  Filled 2020-09-20: qty 5

## 2020-09-20 MED ORDER — SODIUM CHLORIDE 0.9 % IV SOLN
10.0000 mg | Freq: Once | INTRAVENOUS | Status: AC
  Administered 2020-09-20: 10 mg via INTRAVENOUS
  Filled 2020-09-20: qty 10

## 2020-09-20 MED ORDER — SODIUM CHLORIDE 0.9 % IV SOLN: Freq: Once | INTRAVENOUS | Status: AC

## 2020-09-20 MED ORDER — OCTREOTIDE ACETATE 20 MG IM KIT
PACK | INTRAMUSCULAR | Status: AC
  Filled 2020-09-20: qty 1

## 2020-09-20 MED ORDER — SODIUM CHLORIDE 0.9 % IV SOLN
50.0000 mg/m2 | Freq: Once | INTRAVENOUS | Status: AC
  Administered 2020-09-20: 90 mg via INTRAVENOUS
  Filled 2020-09-20: qty 9

## 2020-09-20 MED ORDER — PALONOSETRON HCL INJECTION 0.25 MG/5ML
0.2500 mg | Freq: Once | INTRAVENOUS | Status: AC
  Administered 2020-09-20: 0.25 mg via INTRAVENOUS

## 2020-09-20 NOTE — Progress Notes (Signed)
Patient presents today for Day 5 Vidaza.  Vital signs within parameters for treatment.  Patient states that he is a little fatigued this morning.  No other complaints at this time.  Vidaza infusion given today per MD orders.  Stable during infusion without adverse affects.  Vital signs stable.  No complaints at this time.  Discharge from clinic via motorized scooter in stable condition.  Alert and oriented X 3.  Follow up with [facility] as scheduled.

## 2020-09-20 NOTE — Patient Instructions (Signed)
[facility]  Discharge Instructions: Thank you for choosing [facility] to provide your oncology and hematology care.  If you have a lab appointment with the [facility], please come in thru the Main Entrance and check in at the main information desk.  Wear comfortable clothing and clothing appropriate for easy access to any Portacath or PICC line.   We strive to give you quality time with your provider. You may need to reschedule your appointment if you arrive late (15 or more minutes).  Arriving late affects you and other patients whose appointments are after yours.  Also, if you miss three or more appointments without notifying the office, you may be dismissed from the clinic at the provider's discretion.      For prescription refill requests, have your pharmacy contact our office and allow 72 hours for refills to be completed.    Today you received the following chemotherapy and/or immunotherapy agents Vidaza infusion.     To help prevent nausea and vomiting after your treatment, we encourage you to take your nausea medication as directed.  BELOW ARE SYMPTOMS THAT SHOULD BE REPORTED IMMEDIATELY: . *FEVER GREATER THAN 100.4 F (38 C) OR HIGHER . *CHILLS OR SWEATING . *NAUSEA AND VOMITING THAT IS NOT CONTROLLED WITH YOUR NAUSEA MEDICATION . *UNUSUAL SHORTNESS OF BREATH . *UNUSUAL BRUISING OR BLEEDING . *URINARY PROBLEMS (pain or burning when urinating, or frequent urination) . *BOWEL PROBLEMS (unusual diarrhea, constipation, pain near the anus) . TENDERNESS IN MOUTH AND THROAT WITH OR WITHOUT PRESENCE OF ULCERS (sore throat, sores in mouth, or a toothache) . UNUSUAL RASH, SWELLING OR PAIN  . UNUSUAL VAGINAL DISCHARGE OR ITCHING   Items with * indicate a potential emergency and should be followed up as soon as possible or go to the Emergency Department if any problems should occur.  Please show the CHEMOTHERAPY ALERT CARD or IMMUNOTHERAPY ALERT CARD at  check-in to the Emergency Department and triage nurse.  Should you have questions after your visit or need to cancel or reschedule your appointment, please contact [facility] [phone]  and follow the prompts.  Office hours are 8:00 a.m. to 4:30 p.m. Monday - Friday. Please note that voicemails left after 4:00 p.m. may not be returned until the following business day.  We are closed weekends and major holidays. You have access to a nurse at all times for urgent questions. Please call the main number to the clinic [phone] and follow the prompts.  For any non-urgent questions, you may also contact your provider using MyChart. We now offer e-Visits for anyone 50 and older to request care online for non-urgent symptoms. For details visit [email or web address].   Also download the MyChart app! Go to the app store, search "MyChart", open the app, select [place], and log in with your MyChart username and password.  Due to Covid, a mask is required upon entering the hospital/clinic. If you do not have a mask, one will be given to you upon arrival. For doctor visits, patients may have 1 support person aged 50 or older with them. For treatment visits, patients cannot have anyone with them due to current Covid guidelines and our immunocompromised population.

## 2020-09-23 ENCOUNTER — Other Ambulatory Visit (HOSPITAL_COMMUNITY): Payer: Self-pay

## 2020-09-23 ENCOUNTER — Ambulatory Visit (HOSPITAL_COMMUNITY): Payer: Medicare Other

## 2020-09-23 DIAGNOSIS — D469 Myelodysplastic syndrome, unspecified: Secondary | ICD-10-CM

## 2020-09-23 NOTE — Progress Notes (Signed)
Home Health referral placed, [name] has accepted the patient for RN services

## 2020-09-24 ENCOUNTER — Ambulatory Visit (HOSPITAL_COMMUNITY): Payer: Medicare Other

## 2020-09-24 ENCOUNTER — Encounter: Payer: Self-pay | Admitting: General Surgery

## 2020-09-24 ENCOUNTER — Ambulatory Visit (INDEPENDENT_AMBULATORY_CARE_PROVIDER_SITE_OTHER): Payer: Medicare Other | Admitting: General Surgery

## 2020-09-24 ENCOUNTER — Telehealth (HOSPITAL_COMMUNITY): Payer: Self-pay | Admitting: Surgery

## 2020-09-24 ENCOUNTER — Other Ambulatory Visit: Payer: Self-pay

## 2020-09-24 VITALS — BP 111/55 | HR 89 | Temp 98.2°F | Resp 18 | Ht 76.0 in | Wt 140.0 lb

## 2020-09-24 DIAGNOSIS — D61818 Other pancytopenia: Secondary | ICD-10-CM | POA: Diagnosis not present

## 2020-09-24 DIAGNOSIS — D469 Myelodysplastic syndrome, unspecified: Secondary | ICD-10-CM

## 2020-09-24 DIAGNOSIS — K219 Gastro-esophageal reflux disease without esophagitis: Secondary | ICD-10-CM | POA: Diagnosis not present

## 2020-09-24 DIAGNOSIS — R918 Other nonspecific abnormal finding of lung field: Secondary | ICD-10-CM | POA: Diagnosis not present

## 2020-09-24 DIAGNOSIS — N1832 Chronic kidney disease, stage 3b: Secondary | ICD-10-CM | POA: Diagnosis not present

## 2020-09-24 DIAGNOSIS — J449 Chronic obstructive pulmonary disease, unspecified: Secondary | ICD-10-CM | POA: Diagnosis not present

## 2020-09-24 DIAGNOSIS — Z9981 Dependence on supplemental oxygen: Secondary | ICD-10-CM | POA: Diagnosis not present

## 2020-09-24 DIAGNOSIS — Z7952 Long term (current) use of systemic steroids: Secondary | ICD-10-CM | POA: Diagnosis not present

## 2020-09-24 DIAGNOSIS — C419 Malignant neoplasm of bone and articular cartilage, unspecified: Secondary | ICD-10-CM | POA: Diagnosis not present

## 2020-09-24 DIAGNOSIS — Z87891 Personal history of nicotine dependence: Secondary | ICD-10-CM | POA: Diagnosis not present

## 2020-09-24 NOTE — Patient Instructions (Signed)
Will call with information.   Implanted [facility] Guide An implanted port is a device that is placed under the skin. It is usually placed in the chest. The device can be used to give IV medicine, to take blood, or for dialysis. You may have an implanted port if:  You need IV medicine that would be irritating to the small veins in your hands or arms.  You need IV medicines, such as antibiotics, for a long period of time.  You need IV nutrition for a long period of time.  You need dialysis. When you have a port, your health care provider can choose to use the port instead of veins in your arms for these procedures. You may have fewer limitations when using a port than you would if you used other types of long-term IVs, and you will likely be able to return to normal activities after your incision heals. An implanted port has two main parts:  Reservoir. The reservoir is the part where a needle is inserted to give medicines or draw blood. The reservoir is round. After it is placed, it appears as a small, raised area under your skin.  Catheter. The catheter is a thin, flexible tube that connects the reservoir to a vein. Medicine that is inserted into the reservoir goes into the catheter and then into the vein. How is my port accessed? To access your port:  A numbing cream may be placed on the skin over the port site.  Your health care provider will put on a mask and sterile gloves.  The skin over your port will be cleaned carefully with a germ-killing soap and allowed to dry.  Your health care provider will gently pinch the port and insert a needle into it.  Your health care provider will check for a blood return to make sure the port is in the vein and is not clogged.  If your port needs to remain accessed to get medicine continuously (constant infusion), your health care provider will place a clear bandage (dressing) over the needle site. The dressing and needle will need to be changed  every week, or as told by your health care provider. What is flushing? Flushing helps keep the port from getting clogged. Follow instructions from your health care provider about how and when to flush the port. Ports are usually flushed with saline solution or a medicine called heparin. The need for flushing will depend on how the port is used:  If the port is only used from time to time to give medicines or draw blood, the port may need to be flushed: ? Before and after medicines have been given. ? Before and after blood has been drawn. ? As part of routine maintenance. Flushing may be recommended every 4-6 weeks.  If a constant infusion is running, the port may not need to be flushed.  Throw away any syringes in a disposal container that is meant for sharp items (sharps container). You can buy a sharps container from a pharmacy, or you can make one by using an empty hard plastic bottle with a cover. How long will my port stay implanted? The port can stay in for as long as your health care provider thinks it is needed. When it is time for the port to come out, a surgery will be done to remove it. The surgery will be similar to the procedure that was done to put the port in. Follow these instructions at home:  Flush your port  as told by your health care provider.  If you need an infusion over several days, follow instructions from your health care provider about how to take care of your port site. Make sure you: ? Wash your hands with soap and water before you change your dressing. If soap and water are not available, use alcohol-based hand sanitizer. ? Change your dressing as told by your health care provider. ? Place any used dressings or infusion bags into a plastic bag. Throw that bag in the trash. ? Keep the dressing that covers the needle clean and dry. Do not get it wet. ? Do not use scissors or sharp objects near the tube. ? Keep the tube clamped, unless it is being used.  Check  your port site every day for signs of infection. Check for: ? Redness, swelling, or pain. ? Fluid or blood. ? Pus or a bad smell.  Protect the skin around the port site. ? Avoid wearing bra straps that rub or irritate the site. ? Protect the skin around your port from seat belts. Place a soft pad over your chest if needed.  Bathe or shower as told by your health care provider. The site may get wet as long as you are not actively receiving an infusion.  Return to your normal activities as told by your health care provider. Ask your health care provider what activities are safe for you.  Carry a medical alert card or wear a medical alert bracelet at all times. This will let health care providers know that you have an implanted port in case of an emergency.   Get help right away if:  You have redness, swelling, or pain at the port site.  You have fluid or blood coming from your port site.  You have pus or a bad smell coming from the port site.  You have a fever. Summary  Implanted ports are usually placed in the chest for long-term IV access.  Follow instructions from your health care provider about flushing the port and changing bandages (dressings).  Take care of the area around your port by avoiding clothing that puts pressure on the area, and by watching for signs of infection.  Protect the skin around your port from seat belts. Place a soft pad over your chest if needed.  Get help right away if you have a fever or you have redness, swelling, pain, drainage, or a bad smell at the port site. This information is not intended to replace advice given to you by your health care provider. Make sure you discuss any questions you have with your health care provider. Document Revised: 09/18/2019 Document Reviewed: 09/18/2019 Elsevier Patient Education  [place].

## 2020-09-24 NOTE — Progress Notes (Signed)
[name] History and Physical  Reason for Referral: Port placement  Referring Physician:  Dr. Delton Coombes   Chief Complaint    New Patient (Initial Visit)      [name] is a 87 y.o. male.  HPI:  [name] is an 87 yo with COPD, GERD, myelodysplastic syndrome getting treatment with Dr. Delton Coombes. He will need a port placed. He has home O2 but is battery ran on one the way here to this appointment so he is off his O2. He denies any SOB or chest pain. He is pink in color. His O2 saturations were in the 80s in the office. He said he was fine to drive home and did not feel symptomatic.    Past Medical History:  Diagnosis Date  . Bone cancer ([place])   . Chronic kidney insufficiency   . COPD (chronic obstructive pulmonary disease) ([place])   . GERD (gastroesophageal reflux disease)   . Pulmonary nodule     Past Surgical History:  Procedure Laterality Date  . BACK SURGERY    . CATARACT EXTRACTION W/PHACO Right 03/09/2016   Procedure: CATARACT EXTRACTION PHACO AND INTRAOCULAR LENS PLACEMENT RIGHT EYE CDE=8.58;  Surgeon: Tonny Branch, MD;  Location: [facility];  Service: Ophthalmology;  Laterality: Right;  right  . CATARACT EXTRACTION W/PHACO Left 04/13/2016   Procedure: CATARACT EXTRACTION PHACO AND INTRAOCULAR LENS PLACEMENT (IOC);  Surgeon: Tonny Branch, MD;  Location: [facility];  Service: Ophthalmology;  Laterality: Left;  CDE: 8.08  . KYPHOPLASTY Bilateral 06/09/2019   Procedure: T11 KYPHOPLASTY;  Surgeon: Consuella Lose, MD;  Location: [facility];  Service: Neurosurgery;  Laterality: Bilateral;  . None to Date  10/01/15    Family History  Problem Relation Age of Onset  . Kidney disease Mother   . Kidney disease Sister   . Colon cancer Neg Hx   . Gastric cancer Neg Hx   . Esophageal cancer Neg Hx     Social History   Tobacco Use  . Smoking status: Former Smoker    Years: 15.00    Types: Cigarettes, Cigars    Quit date: 10/01/1990    Years since quitting: 30.0   . Smokeless tobacco: Never Used  . Tobacco comment: Quit x 25-30 years; 2 cigars daily when smoked  Vaping Use  . Vaping Use: Never used  Substance Use Topics  . Alcohol use: Not Currently    Alcohol/week: 0.0 standard drinks  . Drug use: No    Medications: I have reviewed the patient's current medications. Allergies as of 09/24/2020   No Known Allergies     Medication List       Accurate as of Sep 24, 2020 11:59 PM. If you have any questions, ask your nurse or doctor.        acetaminophen 325 MG tablet Commonly known as: TYLENOL Take 2 tablets (650 mg total) by mouth every 6 (six) hours as needed for mild pain, fever or headache (or Fever >/= 101).   albuterol 108 (90 Base) MCG/ACT inhaler Commonly known as: VENTOLIN HFA Inhale 2 puffs into the lungs every 4 (four) hours as needed for wheezing or shortness of breath.   azaCITIDine 5 mg/2 mLs in lactated ringers infusion Inject 75 mg/m2 into the vein daily. Days 1-7 every 28 days   doxycycline 100 MG tablet Commonly known as: VIBRA-TABS Take 100 mg by mouth 2 (two) times daily.   pantoprazole 40 MG tablet Commonly known as: PROTONIX Take 40 mg by mouth daily.  prochlorperazine 10 MG tablet Commonly known as: COMPAZINE Take 1 tablet (10 mg total) by mouth every 6 (six) hours as needed for nausea or vomiting.   Procrit 10000 UNIT/ML injection Generic drug: epoetin alfa Inject 1 mL (10,000 Units total) into the skin once a week. What changed: when to take this   Spiriva HandiHaler 18 MCG inhalation capsule Generic drug: tiotropium Place 1 capsule (18 mcg total) into inhaler and inhale daily.   tamsulosin 0.4 MG Caps capsule Commonly known as: FLOMAX Take 1 capsule (0.4 mg total) by mouth daily after supper.   tamsulosin 0.4 MG Caps capsule Commonly known as: FLOMAX Take 1 capsule by mouth daily.        ROS:  A comprehensive review of systems was negative except for: Respiratory: positive for low  saturation off his O2, no SOB or signs of hypoxia on physical exam  Blood pressure (!) 111/55, pulse 89, temperature 98.2 F (36.8 C), temperature source Other (Comment), resp. rate 18, height 6\' 4"  (1.93 m), weight 140 lb (63.5 kg), SpO2 (!) 81 %. Physical Exam Vitals reviewed.  Constitutional:      Appearance: Normal appearance.  HENT:     Head: Atraumatic.     Comments: Pink lips and skin, no signs of cyanosis     Nose: Nose normal.     Mouth/Throat:     Mouth: Mucous membranes are moist.  Eyes:     Extraocular Movements: Extraocular movements intact.  Cardiovascular:     Rate and Rhythm: Normal rate.  Pulmonary:     Effort: Pulmonary effort is normal.     Breath sounds: Normal breath sounds.  Abdominal:     General: There is no distension.     Palpations: Abdomen is soft.     Tenderness: There is no abdominal tenderness.  Musculoskeletal:        General: Normal range of motion.     Cervical back: Normal range of motion.  Skin:    General: Skin is warm.  Neurological:     General: No focal deficit present.     Mental Status: He is alert and oriented to person, place, and time.  Psychiatric:        Mood and Affect: Mood normal.        Behavior: Behavior normal.        Thought Content: Thought content normal.        Judgment: Judgment normal.     Results: None  Assessment & Plan:  [name] is a 87 y.o. male with myelodysplastic syndrome that is getting therapy. He needs a port to be placed. He is right handed. Discussed risk of port placement including bleeding, infection, injury to vessels, pneumothorax. He is ready to proceed. We will call him with scheduling as he needs to get home to get his O2. He reports he is fine to drive home and is not symptomatic from his low saturation.   Left sided port in the upcoming weeks.   All questions were answered to the satisfaction of the patient.  Discussed preop COVID testing.    Virl Cagey 09/25/2020,  10:49 AM

## 2020-09-24 NOTE — Telephone Encounter (Signed)
A nurse with [facility] Otila Kluver) called to see if Dr. Delton Coombes would give a verbal order for a PT/OT evaluation, and to see if he was ok with continuing nursing services for the pt.  Verbal order given for PT/OT evaluation, as well as the continuation of home health nursing services.  I called Otila Kluver back and she verbalized understanding, and also asked if the pt's medication list could be faxed to their office.

## 2020-09-25 NOTE — Telephone Encounter (Signed)
Called patient to check to see how he had been doing since treatment. Patient stated he did not feel good today, weak and "hurting all over" . RN offered pt to come in and get fluids , pt refused. Will recheck on him Thursday or Friday. Patient does live alone and no family close by.

## 2020-09-26 NOTE — H&P (Signed)
[name] History and Physical  Reason for Referral: Port placement  Referring Physician:  Dr. Delton [name]      Chief Complaint    New Patient (Initial Visit)      [name] is a 87 y.o. male.  HPI:  [name] is an 87 yo with COPD, GERD, myelodysplastic syndrome getting treatment with Dr. Delton [name]. He will need a port placed. He has home O2 but is battery ran on one the way here to this appointment so he is off his O2. He denies any SOB or chest pain. He is pink in color. His O2 saturations were in the 80s in the office. He said he was fine to drive home and did not feel symptomatic.        Past Medical History:  Diagnosis Date  . Bone cancer ([place])   . Chronic kidney insufficiency   . COPD (chronic obstructive pulmonary disease) ([place])   . GERD (gastroesophageal reflux disease)   . Pulmonary nodule          Past Surgical History:  Procedure Laterality Date  . BACK SURGERY    . CATARACT EXTRACTION W/PHACO Right 03/09/2016   Procedure: CATARACT EXTRACTION PHACO AND INTRAOCULAR LENS PLACEMENT RIGHT EYE CDE=8.58;  Surgeon: Thomas Branch, MD;  Location: [facility];  Service: Ophthalmology;  Laterality: Right;  right  . CATARACT EXTRACTION W/PHACO Left 04/13/2016   Procedure: CATARACT EXTRACTION PHACO AND INTRAOCULAR LENS PLACEMENT (IOC);  Surgeon: Thomas Branch, MD;  [facility];  Service: Ophthalmology;  Laterality: Left;  CDE: 8.08  . KYPHOPLASTY Bilateral 06/09/2019   Procedure: T11 KYPHOPLASTY;  Surgeon: Thomas Lose, MD;  Location: [facility];  Service: Neurosurgery;  Laterality: Bilateral;  . None to Date  10/01/15         Family History  Problem Relation Age of Onset  . Kidney disease Mother   . Kidney disease Sister   . Colon cancer Neg Hx   . Gastric cancer Neg Hx   . Esophageal cancer Neg Hx     Social History        Tobacco Use  . Smoking status: Former Smoker    Years: 15.00    Types: Cigarettes,  Cigars    Quit date: 10/01/1990    Years since quitting: 30.0  . Smokeless tobacco: Never Used  . Tobacco comment: Quit x 25-30 years; 2 cigars daily when smoked  Vaping Use  . Vaping Use: Never used  Substance Use Topics  . Alcohol use: Not Currently    Alcohol/week: 0.0 standard drinks  . Drug use: No    Medications: I have reviewed the patient's current medications. Allergies as of 09/24/2020   No Known Allergies        Medication List       Accurate as of Sep 24, 2020 11:59 PM. If you have any questions, ask your nurse or doctor.        acetaminophen 325 MG tablet Commonly known as: TYLENOL Take 2 tablets (650 mg total) by mouth every 6 (six) hours as needed for mild pain, fever or headache (or Fever >/= 101).   albuterol 108 (90 Base) MCG/ACT inhaler Commonly known as: VENTOLIN HFA Inhale 2 puffs into the lungs every 4 (four) hours as needed for wheezing or shortness of breath.   azaCITIDine 5 mg/2 mLs in lactated ringers infusion Inject 75 mg/m2 into the vein daily. Days 1-7 every 28 days   doxycycline 100 MG tablet Commonly known as: VIBRA-TABS Take 100  mg by mouth 2 (two) times daily.   pantoprazole 40 MG tablet Commonly known as: PROTONIX Take 40 mg by mouth daily.   prochlorperazine 10 MG tablet Commonly known as: COMPAZINE Take 1 tablet (10 mg total) by mouth every 6 (six) hours as needed for nausea or vomiting.   Procrit 10000 UNIT/ML injection Generic drug: epoetin alfa Inject 1 mL (10,000 Units total) into the skin once a week. What changed: when to take this   Spiriva HandiHaler 18 MCG inhalation capsule Generic drug: tiotropium Place 1 capsule (18 mcg total) into inhaler and inhale daily.   tamsulosin 0.4 MG Caps capsule Commonly known as: FLOMAX Take 1 capsule (0.4 mg total) by mouth daily after supper.   tamsulosin 0.4 MG Caps capsule Commonly known as: FLOMAX Take 1 capsule by mouth daily.        ROS:   A comprehensive review of systems was negative except for: Respiratory: positive for low saturation off his O2, no SOB or signs of hypoxia on physical exam  Blood pressure (!) 111/55, pulse 89, temperature 98.2 F (36.8 C), temperature source Other (Comment), resp. rate 18, height 6\' 4"  (1.93 m), weight 140 lb (63.5 kg), SpO2 (!) 81 %. Physical Exam Vitals reviewed.  Constitutional:      Appearance: Normal appearance.  HENT:     Head: Atraumatic.     Comments: Pink lips and skin, no signs of cyanosis     Nose: Nose normal.     Mouth/Throat:     Mouth: Mucous membranes are moist.  Eyes:     Extraocular Movements: Extraocular movements intact.  Cardiovascular:     Rate and Rhythm: Normal rate.  Pulmonary:     Effort: Pulmonary effort is normal.     Breath sounds: Normal breath sounds.  Abdominal:     General: There is no distension.     Palpations: Abdomen is soft.     Tenderness: There is no abdominal tenderness.  Musculoskeletal:        General: Normal range of motion.     Cervical back: Normal range of motion.  Skin:    General: Skin is warm.  Neurological:     General: No focal deficit present.     Mental Status: He is alert and oriented to person, place, and time.  Psychiatric:        Mood and Affect: Mood normal.        Behavior: Behavior normal.        Thought Content: Thought content normal.        Judgment: Judgment normal.     Results: None  Assessment & Plan:  [name] is a 87 y.o. male with myelodysplastic syndrome that is getting therapy. He needs a port to be placed. He is right handed. Discussed risk of port placement including bleeding, infection, injury to vessels, pneumothorax. He is ready to proceed. We will call him with scheduling as he needs to get home to get his O2. He reports he is fine to drive home and is not symptomatic from his low saturation.   Left sided port in the upcoming weeks.   All questions were answered to the  satisfaction of the patient.  Discussed preop COVID testing.    [name] 09/25/2020, 10:49 AM

## 2020-09-30 ENCOUNTER — Other Ambulatory Visit: Payer: Self-pay

## 2020-09-30 ENCOUNTER — Emergency Department (HOSPITAL_COMMUNITY)
Admission: EM | Admit: 2020-09-30 | Discharge: 2020-10-01 | Disposition: A | Discharge status: Home / Self Care | Payer: Medicare Other | Attending: Emergency Medicine | Admitting: Emergency Medicine

## 2020-09-30 ENCOUNTER — Other Ambulatory Visit (HOSPITAL_COMMUNITY): Payer: Self-pay

## 2020-09-30 ENCOUNTER — Encounter (HOSPITAL_COMMUNITY): Payer: Self-pay | Admitting: *Deleted

## 2020-09-30 DIAGNOSIS — R197 Diarrhea, unspecified: Secondary | ICD-10-CM | POA: Insufficient documentation

## 2020-09-30 DIAGNOSIS — Z85830 Personal history of malignant neoplasm of bone: Secondary | ICD-10-CM | POA: Diagnosis not present

## 2020-09-30 DIAGNOSIS — R112 Nausea with vomiting, unspecified: Secondary | ICD-10-CM | POA: Diagnosis not present

## 2020-09-30 DIAGNOSIS — D500 Iron deficiency anemia secondary to blood loss (chronic): Secondary | ICD-10-CM | POA: Insufficient documentation

## 2020-09-30 DIAGNOSIS — D696 Thrombocytopenia, unspecified: Secondary | ICD-10-CM | POA: Diagnosis not present

## 2020-09-30 DIAGNOSIS — Z87891 Personal history of nicotine dependence: Secondary | ICD-10-CM | POA: Diagnosis not present

## 2020-09-30 DIAGNOSIS — D469 Myelodysplastic syndrome, unspecified: Secondary | ICD-10-CM

## 2020-09-30 DIAGNOSIS — N289 Disorder of kidney and ureter, unspecified: Secondary | ICD-10-CM

## 2020-09-30 DIAGNOSIS — R0902 Hypoxemia: Secondary | ICD-10-CM | POA: Diagnosis not present

## 2020-09-30 DIAGNOSIS — I499 Cardiac arrhythmia, unspecified: Secondary | ICD-10-CM | POA: Diagnosis not present

## 2020-09-30 DIAGNOSIS — R748 Abnormal levels of other serum enzymes: Secondary | ICD-10-CM | POA: Diagnosis not present

## 2020-09-30 DIAGNOSIS — Z7951 Long term (current) use of inhaled steroids: Secondary | ICD-10-CM | POA: Insufficient documentation

## 2020-09-30 DIAGNOSIS — D649 Anemia, unspecified: Secondary | ICD-10-CM | POA: Diagnosis not present

## 2020-09-30 DIAGNOSIS — J449 Chronic obstructive pulmonary disease, unspecified: Secondary | ICD-10-CM | POA: Diagnosis not present

## 2020-09-30 DIAGNOSIS — I491 Atrial premature depolarization: Secondary | ICD-10-CM | POA: Diagnosis not present

## 2020-09-30 DIAGNOSIS — R110 Nausea: Secondary | ICD-10-CM | POA: Diagnosis not present

## 2020-09-30 DIAGNOSIS — R55 Syncope and collapse: Secondary | ICD-10-CM | POA: Diagnosis not present

## 2020-09-30 DIAGNOSIS — N1832 Chronic kidney disease, stage 3b: Secondary | ICD-10-CM | POA: Diagnosis not present

## 2020-09-30 DIAGNOSIS — R17 Unspecified jaundice: Secondary | ICD-10-CM

## 2020-09-30 LAB — CBC
HCT: 23.8 % — ABNORMAL LOW (ref 39.0–52.0)
Hemoglobin: 7 g/dL — ABNORMAL LOW (ref 13.0–17.0)
MCH: 24.3 pg — ABNORMAL LOW (ref 26.0–34.0)
MCHC: 29.4 g/dL — ABNORMAL LOW (ref 30.0–36.0)
MCV: 82.6 fL (ref 80.0–100.0)
Platelets: 32 10*3/uL — ABNORMAL LOW (ref 150–400)
RBC: 2.88 MIL/uL — ABNORMAL LOW (ref 4.22–5.81)
RDW: 19.9 % — ABNORMAL HIGH (ref 11.5–15.5)
WBC: 4.9 10*3/uL (ref 4.0–10.5)
nRBC: 1 % — ABNORMAL HIGH (ref 0.0–0.2)

## 2020-09-30 LAB — COMPREHENSIVE METABOLIC PANEL
ALT: 15 U/L (ref 0–44)
AST: 20 U/L (ref 15–41)
Albumin: 3.7 g/dL (ref 3.5–5.0)
Alkaline Phosphatase: 64 U/L (ref 38–126)
Anion gap: 12 (ref 5–15)
BUN: 33 mg/dL — ABNORMAL HIGH (ref 8–23)
CO2: 25 mmol/L (ref 22–32)
Calcium: 9.8 mg/dL (ref 8.9–10.3)
Chloride: 104 mmol/L (ref 98–111)
Creatinine, Ser: 2.13 mg/dL — ABNORMAL HIGH (ref 0.61–1.24)
GFR, Estimated: 29 mL/min — ABNORMAL LOW (ref >60)
Glucose, Bld: 108 mg/dL — ABNORMAL HIGH (ref 70–99)
Potassium: 3.8 mmol/L (ref 3.5–5.1)
Sodium: 141 mmol/L (ref 135–145)
Total Bilirubin: 1.7 mg/dL — ABNORMAL HIGH (ref 0.3–1.2)
Total Protein: 7 g/dL (ref 6.5–8.1)

## 2020-09-30 LAB — LIPASE, BLOOD: Lipase: 35 U/L (ref 11–51)

## 2020-09-30 MED ORDER — LACTATED RINGERS IV BOLUS
1000.0000 mL | Freq: Once | INTRAVENOUS | Status: AC
  Administered 2020-09-30: 1000 mL via INTRAVENOUS

## 2020-09-30 MED ORDER — ACETAMINOPHEN 325 MG PO TABS
650.0000 mg | ORAL_TABLET | Freq: Once | ORAL | Status: AC
  Administered 2020-09-30: 650 mg via ORAL
  Filled 2020-09-30: qty 2

## 2020-09-30 MED ORDER — ONDANSETRON HCL 4 MG/2ML IJ SOLN
4.0000 mg | Freq: Once | INTRAMUSCULAR | Status: AC
  Administered 2020-09-30: 4 mg via INTRAVENOUS
  Filled 2020-09-30: qty 2

## 2020-09-30 NOTE — Progress Notes (Signed)
[address] [place], [place] [address]   CLINIC:  Medical Oncology/Hematology  PCP:  Sharilyn Sites, [place] / [place] [address] [phone]   REASON FOR VISIT:  Follow-up for MDS  PRIOR THERAPY: Aranesp, Retacrit, intermittent PRBC transfusions  NGS Results: not done  CURRENT THERAPY: Azacitidine (initiated on 09/16/2020)  BRIEF ONCOLOGIC HISTORY:  Oncology History  Myelodysplastic syndrome ([place])  09/16/2020 -  Chemotherapy    Patient is on Treatment Plan: MYELODYSPLASIA  AZACITIDINE IV D1-7 Q28D        CANCER STAGING: Cancer Staging No matching staging information was found for the patient.  INTERVAL HISTORY:  [name], a 87 y.o. male, returns for routine follow-up of his MDS. [name] was last seen on 09/16/2020.   Today he reports feeling fatigued. He reports passing out last night and was seen in the ER at [facility]; previous to this episode he experienced stomach pain/upset as well as constipation. He reports one episode of diarrhea since Tx 2 weeks ago. He reports reduced appetite, but complains of no other side effects due to chemo. He denies bleeding in nose or stool, but he reports light-headedness upon standing. He lives alone at home. His energy level is a 5/10 and reports his baseline is 8/10. He reports drinking sufficient fluids but admits poor appetite-the decreased appetite is not new. He denies any CP or cough.  REVIEW OF SYSTEMS:  Review of Systems  Constitutional: Positive for appetite change (reduced) and fatigue (5/10).  HENT:   Negative for nosebleeds.   Respiratory: Negative for cough.   Cardiovascular: Negative for chest pain.  Gastrointestinal: Positive for abdominal pain (stomach), constipation and diarrhea. Negative for blood in stool.  All other systems reviewed and are negative.   PAST MEDICAL/SURGICAL HISTORY:  Past Medical History:  Diagnosis Date  . Bone cancer ([place])   .  Chronic kidney insufficiency   . COPD (chronic obstructive pulmonary disease) ([place])   . GERD (gastroesophageal reflux disease)   . Pulmonary nodule    Past Surgical History:  Procedure Laterality Date  . BACK SURGERY    . CATARACT EXTRACTION W/PHACO Right 03/09/2016   Procedure: CATARACT EXTRACTION PHACO AND INTRAOCULAR LENS PLACEMENT RIGHT EYE CDE=8.58;  Surgeon: Tonny Branch, MD;  [facility];  Service: Ophthalmology;  Laterality: Right;  right  . CATARACT EXTRACTION W/PHACO Left 04/13/2016   Procedure: CATARACT EXTRACTION PHACO AND INTRAOCULAR LENS PLACEMENT (IOC);  Surgeon: Tonny Branch, MD;  [facility];  Service: Ophthalmology;  Laterality: Left;  CDE: 8.08  . KYPHOPLASTY Bilateral 06/09/2019   Procedure: T11 KYPHOPLASTY;  Surgeon: Consuella Lose, MD;  Location: [facility];  Service: Neurosurgery;  Laterality: Bilateral;  . None to Date  10/01/15    SOCIAL HISTORY:  Social History   Socioeconomic History  . Marital status: Widowed    Spouse name: Not on file  . Number of children: Not on file  . Years of education: Not on file  . Highest education level: Not on file  Occupational History  . Not on file  Tobacco Use  . Smoking status: Former Smoker    Years: 15.00    Types: Cigarettes, Cigars    Quit date: 10/01/1990    Years since quitting: 30.0  . Smokeless tobacco: Never Used  . Tobacco comment: Quit x 25-30 years; 2 cigars daily when smoked  Vaping Use  . Vaping Use: Never used  Substance and Sexual Activity  . Alcohol use: Not  Currently    Alcohol/week: 0.0 standard drinks  . Drug use: No  . Sexual activity: Never    Birth control/protection: None  Other Topics Concern  . Not on file  Social History Narrative  . Not on file   Social Determinants of Health   Financial Resource Strain: High Risk  . Difficulty of Paying Living Expenses: Hard  Food Insecurity: No Food Insecurity  . Worried About Charity fundraiser in the Last Year: Never true  . Ran Out  of Food in the Last Year: Never true  Transportation Needs: No Transportation Needs  . Lack of Transportation (Medical): No  . Lack of Transportation (Non-Medical): No  Physical Activity: Inactive  . Days of Exercise per Week: 0 days  . Minutes of Exercise per Session: 0 min  Stress: No Stress Concern Present  . Feeling of Stress : Not at all  Social Connections: Moderately Integrated  . Frequency of Communication with Friends and Family: More than three times a week  . Frequency of Social Gatherings with Friends and Family: More than three times a week  . Attends Religious Services: More than 4 times per year  . Active Member of Clubs or Organizations: Yes  . Attends Archivist Meetings: More than 4 times per year  . Marital Status: Widowed  Intimate Partner Violence: Not At Risk  . Fear of Current or Ex-Partner: No  . Emotionally Abused: No  . Physically Abused: No  . Sexually Abused: No    FAMILY HISTORY:  Family History  Problem Relation Age of Onset  . Kidney disease Mother   . Kidney disease Sister   . Colon cancer Neg Hx   . Gastric cancer Neg Hx   . Esophageal cancer Neg Hx     CURRENT MEDICATIONS:  Current Outpatient Medications  Medication Sig Dispense Refill  . acetaminophen (TYLENOL) 325 MG tablet Take 2 tablets (650 mg total) by mouth every 6 (six) hours as needed for mild pain, fever or headache (or Fever >/= 101). 30 tablet 2  . albuterol (VENTOLIN HFA) 108 (90 Base) MCG/ACT inhaler Inhale 2 puffs into the lungs every 4 (four) hours as needed for wheezing or shortness of breath. 18 g 2  . azaCITIDine 5 mg/2 mLs in lactated ringers infusion Inject 75 mg/m2 into the vein daily. Days 1-7 every 28 days    . doxycycline (VIBRA-TABS) 100 MG tablet Take 100 mg by mouth 2 (two) times daily.    Marland Kitchen epoetin alfa (PROCRIT) 74081 UNIT/ML injection Inject 1 mL (10,000 Units total) into the skin once a week. (Patient taking differently: Inject 10,000 Units into the  skin every 30 (thirty) days.) 1 mL 5  . pantoprazole (PROTONIX) 40 MG tablet Take 40 mg by mouth daily.    . prochlorperazine (COMPAZINE) 10 MG tablet Take 1 tablet (10 mg total) by mouth every 6 (six) hours as needed for nausea or vomiting. 30 tablet 3  . tamsulosin (FLOMAX) 0.4 MG CAPS capsule Take 1 capsule (0.4 mg total) by mouth daily after supper. 30 capsule 2  . tamsulosin (FLOMAX) 0.4 MG CAPS capsule Take 1 capsule by mouth daily.    Marland Kitchen tiotropium (SPIRIVA HANDIHALER) 18 MCG inhalation capsule Place 1 capsule (18 mcg total) into inhaler and inhale daily. 30 capsule 12   No current facility-administered medications for this visit.    ALLERGIES:  No Known Allergies  PHYSICAL EXAM:  Performance status (ECOG): 2 - Symptomatic, <50% confined to bed  There were  no vitals filed for this visit. Wt Readings from Last 3 Encounters:  09/24/20 140 lb (63.5 kg)  09/16/20 134 lb 14.4 oz (61.2 kg)  09/04/20 134 lb 3.2 oz (60.9 kg)   Physical Exam Vitals reviewed.  Constitutional:      Appearance: Normal appearance.  Cardiovascular:     Rate and Rhythm: Normal rate and regular rhythm.     Pulses: Normal pulses.     Heart sounds: Normal heart sounds.  Pulmonary:     Effort: Pulmonary effort is normal.     Breath sounds: Normal breath sounds.  Musculoskeletal:     Right lower leg: Edema (trace) present.     Left lower leg: No edema.  Neurological:     General: No focal deficit present.     Mental Status: He is alert and oriented to person, place, and time.  Psychiatric:        Mood and Affect: Mood normal.        Behavior: Behavior normal.      LABORATORY DATA:  I have reviewed the labs as listed.  CBC Latest Ref Rng & Units 09/16/2020 09/04/2020 08/28/2020  WBC 4.0 - 10.5 K/uL 2.2(L) 2.0(L) 2.2(L)  Hemoglobin 13.0 - 17.0 g/dL 7.7(L) 8.2(L) 8.3(L)  Hematocrit 39.0 - 52.0 % 26.2(L) 27.8(L) 28.4(L)  Platelets 150 - 400 K/uL 80(L) 93(L) 82(L)   CMP Latest Ref Rng & Units 09/16/2020  07/27/2020 06/26/2020  Glucose 70 - 99 mg/dL 83 105(H) 99  BUN 8 - 23 mg/dL 35(H) 39(H) 35(H)  Creatinine 0.61 - 1.24 mg/dL 1.68(H) 2.43(H) 1.86(H)  Sodium 135 - 145 mmol/L 138 138 135  Potassium 3.5 - 5.1 mmol/L 4.3 4.2 4.2  Chloride 98 - 111 mmol/L 109 104 107  CO2 22 - 32 mmol/L 24 25 20(L)  Calcium 8.9 - 10.3 mg/dL 9.7 11.7(H) 9.5  Total Protein 6.5 - 8.1 g/dL 7.0 - -  Total Bilirubin 0.3 - 1.2 mg/dL 0.8 - -  Alkaline Phos 38 - 126 U/L 58 - -  AST 15 - 41 U/L 16 - -  ALT 0 - 44 U/L 12 - -    DIAGNOSTIC IMAGING:  I have independently reviewed the scans and discussed with the patient. No results found.   ASSESSMENT:  1. Myelodysplastic syndrome with pancytopenia: -Bone marrow biopsy on 12/08/2016 in [place], [place] showed normocellular marrow with trilineage hematopoiesis with erythroid predominance and dysmegakaryopoiesis.Blasts were not increased. Cytogenetics showed 46, XY,del(12)(p.2p13)[8]/46,XY[12]. FISH was positive for 12p-. -Bone marrow biopsy and testing in 2018 wasconsistent with myelodysplastic syndrome with single lineage dysplasia (MDS-SLD),IPSS-Rscore of 2, consistent with low risk category -Repeat bone marrow biopsy performed on 08/13/2019 by Dr. Delton Coombes with the following results:  Hypercellular bone marrow with dyspoietic changes  Bone marrow aspirate with 5% blastic cells of granulocytic precursors  Flow pathology with 3% blasts  Peripheral blood with 2% circulating blasts  Cytogenetic analysis show del(12p)and 13 of the examined cells with remaining 7 cells showing normal chromosome  Karyotype:46,XY,del(12)(p13p11.2)[13]/46,XY[7]  FISH analysis was normal -Based on most recent bone marrow biopsy (08/04/2020), patient's IPSS-R is5, showing that he has progressed to the high risk category -He has required multiple blood transfusions over the past 6 months, most recently on 08/09/2020 (Hgb 7.1) -No improvement on monthly Aranesp or  onweekly Retacrit injections(started on 07/10/2020 - discontinued 09/04/2020) -Stool occult blood cardsx3 werenegative -Azacitidine (50 mg/m2) x5 days started on 09/16/2020.   PLAN:  1. Myelodysplastic syndrome (high risk) with pancytopenia  -First cycle of azacitidine on  09/16/2020, 50 mg per metered squared for 5 days. - He felt weak yesterday, and nearly passed out. - He was evaluated in the ER and was found to have hemoglobin of 7.  Creatinine was elevated at 2.13.  He received some hydration and his symptoms improved. - Today he is seen in follow-up at our office.  He complains of low energy levels, 5 out of 10.  He had 1 episode of diarrhea last night.  No vomiting or further diarrhea. - Reviewed his labs today which showed hemoglobin further dropped to 6.1 with MCV 81.5.  Platelet count is 31.  Denies any bleeding issues. - Because of his weakness, I think he will benefit from transfusion today. - I have reached out to ER attending who kindly accepted this patient for further management with 2 units of blood transfusion. - He will follow-up with his in 2 to 3 weeks for blood check and next cycle.  2. CKD stage 3b -His creatinine has gone up to 2.1. - Encouraged hydration.   Orders placed this encounter:  No orders of the defined types were placed in this encounter.    Thomas Jack, MD [place] [phone]   I, [name], am acting as a scribe for Dr. Derek [name].  I, Thomas Jack MD, have reviewed the above documentation for accuracy and completeness, and I agree with the above.

## 2020-09-30 NOTE — ED Triage Notes (Signed)
Pt with abd pain with N/V/D since 1600 today.

## 2020-09-30 NOTE — ED Provider Notes (Signed)
[facility] EMERGENCY DEPARTMENT Provider Note   CSN: [id number] Arrival date & time: 09/30/20  2025     History Chief Complaint  Patient presents with  . Abdominal Pain    [name] is a 87 y.o. male.  The history is provided by the patient.  Abdominal Pain He has history of mild dysplastic syndrome, chronic kidney disease, COPD and comes in because of nausea, vomiting, diarrhea, and generalized body aches.  This afternoon, he developed generalized abdominal cramping in his sense that he had to move his bowels.  He tried to go to the bathroom but got very weak and had to crawl to bed.  He did have 1 episode of diarrhea and had several episodes of nausea and vomiting.  He he had subjective fever with chills and sweats.  Abdominal pain has resolved and he no longer is having any nausea or vomiting or sense of impending diarrhea but he is complaining of generalized body aches.  He denies any sick contacts.  He came in by ambulance and EMS did tell him that he had a fever but he does not know how high it was.  Past Medical History:  Diagnosis Date  . Bone cancer ([place])   . Chronic kidney insufficiency   . COPD (chronic obstructive pulmonary disease) ([place])   . GERD (gastroesophageal reflux disease)   . Pulmonary nodule     Patient Active Problem List   Diagnosis Date Noted  . Symptomatic anemia 04/03/2020  . 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 04/03/2020  . Chronic respiratory failure with hypoxia /// 3L/min at baseline 04/03/2020  . Urinary frequency 02/26/2020  . Benign prostatic hyperplasia with urinary obstruction 02/26/2020  . Myelodysplastic syndrome ([place]) 06/16/2019  . HAP (hospital-acquired pneumonia) 06/16/2019  . CKD (chronic kidney disease), stage IIIB 06/16/2019  . Sepsis ([place]) 06/16/2019  . Acute exacerbation of chronic low back pain 06/06/2019  . Thrombocytopenia ([place]) 06/06/2019  .  Intractable back pain 06/06/2019  . COPD (chronic obstructive pulmonary disease) ([place])   . Chronic kidney insufficiency   . Pulmonary nodule 05/19/2018  . Bronchiectasis ([place]) 05/19/2018  . Anemia 09/17/2017  . Abnormal CT of the abdomen 11/02/2016  . Loss of weight 11/02/2016  . History of colonic polyps 11/02/2016  . GERD (gastroesophageal reflux disease) 10/01/2015    Past Surgical History:  Procedure Laterality Date  . BACK SURGERY    . CATARACT EXTRACTION W/PHACO Right 03/09/2016   Procedure: CATARACT EXTRACTION PHACO AND INTRAOCULAR LENS PLACEMENT RIGHT EYE CDE=8.58;  Surgeon: Tonny Branch, MD;  Location: [facility];  Service: Ophthalmology;  Laterality: Right;  right  . CATARACT EXTRACTION W/PHACO Left 04/13/2016   Procedure: CATARACT EXTRACTION PHACO AND INTRAOCULAR LENS PLACEMENT (IOC);  Surgeon: Tonny Branch, MD;  Location: [facility];  Service: Ophthalmology;  Laterality: Left;  CDE: 8.08  . KYPHOPLASTY Bilateral 06/09/2019   Procedure: T11 KYPHOPLASTY;  Surgeon: Consuella Lose, MD;  Location: [facility];  Service: Neurosurgery;  Laterality: Bilateral;  . None to Date  10/01/15       Family History  Problem Relation Age of Onset  . Kidney disease Mother   . Kidney disease Sister   . Colon cancer Neg Hx   . Gastric cancer Neg Hx   . Esophageal cancer Neg Hx     Social History   Tobacco Use  . Smoking status: Former Smoker    Years: 15.00    Types: Cigarettes, Cigars    Quit date: 10/01/1990  Years since quitting: 30.0  . Smokeless tobacco: Never Used  . Tobacco comment: Quit x 25-30 years; 2 cigars daily when smoked  Vaping Use  . Vaping Use: Never used  Substance Use Topics  . Alcohol use: Not Currently    Alcohol/week: 0.0 standard drinks  . Drug use: No    Home Medications Prior to Admission medications   Medication Sig Start Date End Date Taking? Authorizing Provider  acetaminophen (TYLENOL) 325 MG tablet Take 2 tablets (650 mg total) by mouth every 6 (six)  hours as needed for mild pain, fever or headache (or Fever >/= 101). 04/04/20   Roxan Hockey, MD  albuterol (VENTOLIN HFA) 108 (90 Base) MCG/ACT inhaler Inhale 2 puffs into the lungs every 4 (four) hours as needed for wheezing or shortness of breath. 04/04/20   Roxan Hockey, MD  azaCITIDine 5 mg/2 mLs in lactated ringers infusion Inject 75 mg/m2 into the vein daily. Days 1-7 every 28 days 09/16/20   [provider]  doxycycline (VIBRA-TABS) 100 MG tablet Take 100 mg by mouth 2 (two) times daily. 08/26/20   [provider]  epoetin alfa (PROCRIT) 41937 UNIT/ML injection Inject 1 mL (10,000 Units total) into the skin once a week. Patient taking differently: Inject 10,000 Units into the skin every 30 (thirty) days. 04/04/20   Roxan Hockey, MD  pantoprazole (PROTONIX) 40 MG tablet Take 40 mg by mouth daily. 06/21/19   [provider]  prochlorperazine (COMPAZINE) 10 MG tablet Take 1 tablet (10 mg total) by mouth every 6 (six) hours as needed for nausea or vomiting. 09/13/20   Derek Jack, MD  tamsulosin (FLOMAX) 0.4 MG CAPS capsule Take 1 capsule (0.4 mg total) by mouth daily after supper. 04/04/20   Roxan Hockey, MD  tamsulosin (FLOMAX) 0.4 MG CAPS capsule Take 1 capsule by mouth daily. 08/23/20 08/23/21  [provider]  tiotropium (SPIRIVA HANDIHALER) 18 MCG inhalation capsule Place 1 capsule (18 mcg total) into inhaler and inhale daily. 04/04/20   Roxan Hockey, MD    Allergies    Patient has no known allergies.  Review of Systems   Review of Systems  Gastrointestinal: Positive for abdominal pain.  All other systems reviewed and are negative.   Physical Exam Updated Vital Signs BP 96/65   Pulse 87   Temp 97.8 F (36.6 C) (Oral)   Resp 18   Ht 6\' 4"  (1.93 m)   Wt 63.5 kg   SpO2 95%   BMI 17.04 kg/m   Physical Exam Vitals and nursing note reviewed.   87 year old male, resting comfortably and in no acute distress. Vital signs  are normal. Oxygen saturation is 95%, which is normal. Head is normocephalic and atraumatic. PERRLA, EOMI. Oropharynx is clear.  Conjunctivae are pale. Neck is nontender and supple without adenopathy or JVD. Back is nontender and there is no CVA tenderness. Lungs are clear without rales, wheezes, or rhonchi. Chest is nontender. Heart has regular rate and rhythm without murmur. Abdomen is soft, flat, nontender without masses or hepatosplenomegaly and peristalsis is hyypoactive. Extremities have no cyanosis or edema, full range of motion is present. Skin is warm and dry without rash. Neurologic: Mental status is normal, cranial nerves are intact, there are no motor or sensory deficits.  ED Results / Procedures / Treatments   Labs (all labs ordered are listed, but only abnormal results are displayed) Labs Reviewed  COMPREHENSIVE METABOLIC PANEL - Abnormal; Notable for the following components:      Result  Value   Glucose, Bld 108 (*)    BUN 33 (*)    Creatinine, Ser 2.13 (*)    Total Bilirubin 1.7 (*)    GFR, Estimated 29 (*)    All other components within normal limits  CBC - Abnormal; Notable for the following components:   RBC 2.88 (*)    Hemoglobin 7.0 (*)    HCT 23.8 (*)    MCH 24.3 (*)    MCHC 29.4 (*)    RDW 19.9 (*)    Platelets 32 (*)    nRBC 1.0 (*)    All other components within normal limits  LIPASE, BLOOD  URINALYSIS, ROUTINE W REFLEX MICROSCOPIC    EKG None  Radiology No results found.  Procedures Procedures   Medications Ordered in ED Medications  ondansetron (ZOFRAN) injection 4 mg (4 mg Intravenous Given 09/30/20 2330)  lactated ringers bolus 1,000 mL (0 mLs Intravenous Stopped 10/01/20 0011)  acetaminophen (TYLENOL) tablet 650 mg (650 mg Oral Given 09/30/20 2330)    ED Course  I have reviewed the triage vital signs and the nursing notes.  Pertinent labs & imaging results that were available during my care of the patient were reviewed by me and  considered in my medical decision making (see chart for details).   MDM Rules/Calculators/A&P Nausea, vomiting, diarrhea and pattern suggestive of viral gastroenteritis.  No red flags to suggest more serious pathology.  Patient is currently nontoxic in appearance.  Screening labs do show worsening of chronic anemia as well as worsening of chronic renal insufficiency.  There is a strong suspicion this is related to dehydration and will be given IV fluids.  Hemoglobin is at a level where he normally gets transfusions, will send blood for type and screen.  Thrombocytopenia is present and somewhat worse than baseline.  Elevation of bilirubin noted with normal transaminases, significance is unclear.  Following IV fluids, patient felt better.  He was able to ambulate.  He wishes to go home if possible.  He will need blood transfusion, but this can be arranged as an outpatient.  He is discharged with prescription for ondansetron, told to use over-the-counter loperamide as needed, return if symptoms are worsening.  He is to follow-up with his oncologist to arrange for outpatient transfusion.  Final Clinical Impression(s) / ED Diagnoses Final diagnoses:  Nausea vomiting and diarrhea  Renal insufficiency  Normocytic anemia  Thrombocytopenia (HCC)  Serum total bilirubin elevated    Rx / DC Orders ED Discharge Orders         Ordered    ondansetron (ZOFRAN) 4 MG tablet  Every 6 hours PRN        10/01/20 5885           Delora Fuel, MD [birth year] [phone]

## 2020-10-01 ENCOUNTER — Emergency Department (HOSPITAL_COMMUNITY)
Admission: EM | Admit: 2020-10-01 | Discharge: 2020-10-01 | Disposition: A | Discharge status: Home / Self Care | Payer: Medicare Other | Source: Home / Self Care | Attending: Emergency Medicine | Admitting: Emergency Medicine

## 2020-10-01 ENCOUNTER — Encounter (HOSPITAL_COMMUNITY): Payer: Self-pay | Admitting: Emergency Medicine

## 2020-10-01 ENCOUNTER — Other Ambulatory Visit: Payer: Self-pay

## 2020-10-01 ENCOUNTER — Inpatient Hospital Stay (HOSPITAL_COMMUNITY): Payer: Medicare Other

## 2020-10-01 ENCOUNTER — Inpatient Hospital Stay (HOSPITAL_BASED_OUTPATIENT_CLINIC_OR_DEPARTMENT_OTHER): Payer: Medicare Other | Admitting: Hematology

## 2020-10-01 VITALS — BP 103/58 | HR 91 | Temp 97.1°F | Resp 19 | Wt 137.3 lb

## 2020-10-01 DIAGNOSIS — Z87891 Personal history of nicotine dependence: Secondary | ICD-10-CM | POA: Insufficient documentation

## 2020-10-01 DIAGNOSIS — D649 Anemia, unspecified: Secondary | ICD-10-CM

## 2020-10-01 DIAGNOSIS — N1832 Chronic kidney disease, stage 3b: Secondary | ICD-10-CM | POA: Insufficient documentation

## 2020-10-01 DIAGNOSIS — N289 Disorder of kidney and ureter, unspecified: Secondary | ICD-10-CM | POA: Diagnosis not present

## 2020-10-01 DIAGNOSIS — D469 Myelodysplastic syndrome, unspecified: Secondary | ICD-10-CM | POA: Diagnosis not present

## 2020-10-01 DIAGNOSIS — Z85830 Personal history of malignant neoplasm of bone: Secondary | ICD-10-CM | POA: Insufficient documentation

## 2020-10-01 DIAGNOSIS — J449 Chronic obstructive pulmonary disease, unspecified: Secondary | ICD-10-CM | POA: Insufficient documentation

## 2020-10-01 LAB — COMPREHENSIVE METABOLIC PANEL
ALT: 12 U/L (ref 0–44)
AST: 17 U/L (ref 15–41)
Albumin: 3.2 g/dL — ABNORMAL LOW (ref 3.5–5.0)
Alkaline Phosphatase: 59 U/L (ref 38–126)
Anion gap: 8 (ref 5–15)
BUN: 34 mg/dL — ABNORMAL HIGH (ref 8–23)
CO2: 24 mmol/L (ref 22–32)
Calcium: 9 mg/dL (ref 8.9–10.3)
Chloride: 104 mmol/L (ref 98–111)
Creatinine, Ser: 2.11 mg/dL — ABNORMAL HIGH (ref 0.61–1.24)
GFR, Estimated: 29 mL/min — ABNORMAL LOW (ref >60)
Glucose, Bld: 100 mg/dL — ABNORMAL HIGH (ref 70–99)
Potassium: 3.9 mmol/L (ref 3.5–5.1)
Sodium: 136 mmol/L (ref 135–145)
Total Bilirubin: 1.4 mg/dL — ABNORMAL HIGH (ref 0.3–1.2)
Total Protein: 6.1 g/dL — ABNORMAL LOW (ref 6.5–8.1)

## 2020-10-01 LAB — MISC LABCORP TEST (SEND OUT): Labcorp test code: 451953

## 2020-10-01 LAB — CBC WITH DIFFERENTIAL/PLATELET
Abs Immature Granulocytes: 0.2 10*3/uL — ABNORMAL HIGH (ref 0.00–0.07)
Basophils Absolute: 0 10*3/uL (ref 0.0–0.1)
Basophils Relative: 0 %
Eosinophils Absolute: 0 10*3/uL (ref 0.0–0.5)
Eosinophils Relative: 0 %
HCT: 20.2 % — ABNORMAL LOW (ref 39.0–52.0)
Hemoglobin: 6.1 g/dL — CL (ref 13.0–17.0)
Immature Granulocytes: 4 %
Lymphocytes Relative: 8 %
Lymphs Abs: 0.4 10*3/uL — ABNORMAL LOW (ref 0.7–4.0)
MCH: 24.6 pg — ABNORMAL LOW (ref 26.0–34.0)
MCHC: 30.2 g/dL (ref 30.0–36.0)
MCV: 81.5 fL (ref 80.0–100.0)
Monocytes Absolute: 0.4 10*3/uL (ref 0.1–1.0)
Monocytes Relative: 7 %
Neutro Abs: 4.5 10*3/uL (ref 1.7–7.7)
Neutrophils Relative %: 81 %
Platelets: 31 10*3/uL — ABNORMAL LOW (ref 150–400)
RBC: 2.48 MIL/uL — ABNORMAL LOW (ref 4.22–5.81)
RDW: 19.9 % — ABNORMAL HIGH (ref 11.5–15.5)
WBC: 5.5 10*3/uL (ref 4.0–10.5)
nRBC: 0.7 % — ABNORMAL HIGH (ref 0.0–0.2)

## 2020-10-01 LAB — PREPARE RBC (CROSSMATCH)

## 2020-10-01 LAB — SAMPLE TO BLOOD BANK

## 2020-10-01 LAB — TYPE AND SCREEN
ABO/RH(D): B NEG
Antibody Screen: NEGATIVE

## 2020-10-01 LAB — LACTATE DEHYDROGENASE: LDH: 122 U/L (ref 98–192)

## 2020-10-01 MED ORDER — SODIUM CHLORIDE 0.9% IV SOLUTION: Freq: Once | INTRAVENOUS | Status: AC

## 2020-10-01 MED ORDER — ONDANSETRON HCL 4 MG PO TABS: 4.0000 mg | ORAL_TABLET | Freq: Four times a day (QID) | ORAL | 0 refills | Status: DC | PRN

## 2020-10-01 NOTE — ED Provider Notes (Signed)
  Physical Exam  BP 119/72   Pulse 79   Temp 98.5 F (36.9 C)   Resp 20   Ht 6\' 4"  (1.93 m)   Wt 62.1 kg   SpO2 95%   BMI 16.68 kg/m   Physical Exam  ED Course/Procedures     Procedures  MDM   Care of patient assumed from preceding ED provider, Andi Hence, PA-C at time of shift change.  Please see his associated note for further insight the patient's ED course.  In brief patient has history of myelodysplastic syndrome and pancytopenia who presented at the prompting of her oncologist, Dr. Delton Coombes, for worsening weakness in the setting of worsening anemia.  Patient with hemoglobin of 6.1 today.  Presented to the ED for blood transfusion x 2 units.  At time of shift change patient is receiving the second unit of blood.  Plan is for discharge home with patient's symptoms are improved after administration of blood and he is able to ambulate independently without difficulty and without becoming extremely tachycardic or short of breath.  Patient was evaluated after completion of second blood transfusion.  States he is feeling much improved.  He was ambulated by technician who states that he maintained his oxygen saturation and did not become tachycardic with ambulation.  Patient states he feels he is ready to be discharged home.  Given reassuring vital signs and close follow-up with oncologist, feel patient is safe to be discharged at this time.  No further work-up warranted in the ED.  Tionne voiced understanding of his medical evaluation and treatment plan.  Each of his questions was answered to his expressed satisfaction. Return precautions given. Patient is well-appearing, stable, and appropriate for discharge at this time.  This chart was dictated using voice recognition software, Dragon. Despite the best efforts of this provider to proofread and correct errors, errors may still occur which can change documentation meaning.       Aura Dials 10/01/20 2207     Daleen Bo, MD 10/02/20 [phone]

## 2020-10-01 NOTE — Patient Instructions (Addendum)
[place] at [facility] Discharge Instructions  You were seen today by Dr. Delton Coombes. He went over your recent results. Drink plenty of fluids to stay hydrated. Dr. Delton Coombes will see you back in 3 weeks for labs and follow up.   Thank you for choosing [place] at [facility] to provide your oncology and hematology care.  To afford each patient quality time with our provider, please arrive at least 15 minutes before your scheduled appointment time.   If you have a lab appointment with the [facility] please come in thru the Main Entrance and check in at the main information desk  You need to re-schedule your appointment should you arrive 10 or more minutes late.  We strive to give you quality time with our providers, and arriving late affects you and other patients whose appointments are after yours.  Also, if you no show three or more times for appointments you may be dismissed from the clinic at the providers discretion.     Again, thank you for choosing [facility]  Our hope is that these requests will decrease the amount of time that you wait before being seen by our physicians.       _____________________________________________________________  Should you have questions after your visit to [facility], please contact our office at [phone] between the hours of 8:00 a.m. and 4:30 p.m.  Voicemails left after 4:00 p.m. will not be returned until the following business day.  For prescription refill requests, have your pharmacy contact our office and allow 72 hours.    Cancer Center Support Programs:   > Cancer Support Group  2nd Tuesday of the month 1pm-2pm, Journey Room

## 2020-10-01 NOTE — Discharge Instructions (Addendum)
I am glad that you are feeling improved.  You have been transfused with 2 units of packed red blood cells.  You will need to follow-up with your primary care provider or oncologist, Dr. Delton Coombes, in the next 48 hours for laboratory recheck to ensure improvement.  Return to the ED or seek immediate medical attention should you experience any new or worsening symptoms.

## 2020-10-01 NOTE — ED Notes (Signed)
Ambulated pt to BR with stand-by assistance. Pt slightly SOB, but pt states that is normal for him

## 2020-10-01 NOTE — ED Notes (Signed)
Report received, care assumed.

## 2020-10-01 NOTE — ED Triage Notes (Signed)
Pt brought down by [facility] RN with c/o hemoglobin of 6.1. Pt passed out last night and was here at the ED and went home to follow up with Dr. Worthy Keeler. When pt went to follow up today his hemoglobin was lower than last night and MD wanted him sent to ED for blood transfusion and possible admission. Pt reports weakness. Denies pain.

## 2020-10-01 NOTE — ED Provider Notes (Signed)
  Face-to-face evaluation   History: Patient presents for evaluation of weakness, and malaise.  He was evaluated overnight for similar symptoms, referred to his hematologist who he saw today, whereupon he was sent back here.  It was thought last night, that he could get outpatient blood transfusions.  I talked to his hematologist, who suggested that patient received 2 units of blood, and then be discharged from the ED.  Patient is in  agreement with this plan.  Physical exam: Alert, calm, cooperative.  No respiratory distress.  No dysarthria or aphasia.  He is lucid.  Medical screening examination/treatment/procedure(s) were conducted as a shared visit with non-physician practitioner(s) and myself.  I personally evaluated the patient during the encounter    Daleen Bo, MD 10/02/20 [phone]

## 2020-10-01 NOTE — Discharge Instructions (Addendum)
Take loperamide (Imodium A-D) as needed for diarrhea.  Your hemoglobin has fallen and is now at 7.0.  Please contact your cancer doctor to make arrangements for another blood transfusion.  Your creatinine (a blood test of your kidney) is higher than it should be, but not as high as it was 2 months ago.  This will need to be followed closely.  Return to the emergency department if your symptoms are getting worse.

## 2020-10-01 NOTE — ED Provider Notes (Signed)
[facility] EMERGENCY DEPARTMENT Provider Note   CSN: [id number] Arrival date & time: 10/01/20  1608     History Chief Complaint  Patient presents with  . Abnormal Lab    [name] is a 87 y.o. male with past medical history significant for myelodysplastic syndrome with pancytopenia recently started on azacitidine sent here to the ED by his oncologist, Dr. Delton Coombes, for weakness in setting of worsening anemia.  I reviewed patient's medical record and he was seen just yesterday in the ED and had a hemoglobin of 7.0.  Labs obtained today showed further dropping to 6.1 hemoglobin and platelet count of only 31.  It has been noted that there have been multiple negative fecal occult cards.  Given his reported weakness, his oncologist advised him to come to the ED for 2 units of blood given symptomatic anemia.  Labs obtained 2 hours PTA: CBC with differential: Hemoglobin of 6.1.  Normocytic. CMP: Serum creatinine 2.11, relatively consistent with recent labs. Type and screen: B negative.  On my examination, patient is resting comfortably and in good spirits.  He states that he is currently living at home alone.  For the past couple of days he has been feeling weak beyond his baseline.  He states that it is at the point where he feels as though he cannot even get up out of a chair.  He drove himself to his oncology appointment prior to being sent here to the ED for transfusions.  He denies any recent illness or infection, fevers or chills, chest pain or shortness of breath, abdominal pain, nausea or vomiting, urinary symptoms, or change in his bowel habits.  He states that his stool has been brown.  Denies any other complaints outside of the generalized weakness.  HPI     Past Medical History:  Diagnosis Date  . Bone cancer ([place])   . Chronic kidney insufficiency   . COPD (chronic obstructive pulmonary disease) ([place])   . GERD (gastroesophageal reflux disease)   . Pulmonary nodule      Patient Active Problem List   Diagnosis Date Noted  . Symptomatic anemia 04/03/2020  . 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 04/03/2020  . Chronic respiratory failure with hypoxia /// 3L/min at baseline 04/03/2020  . Urinary frequency 02/26/2020  . Benign prostatic hyperplasia with urinary obstruction 02/26/2020  . Myelodysplastic syndrome ([place]) 06/16/2019  . HAP (hospital-acquired pneumonia) 06/16/2019  . CKD (chronic kidney disease), stage IIIB 06/16/2019  . Sepsis ([place]) 06/16/2019  . Acute exacerbation of chronic low back pain 06/06/2019  . Thrombocytopenia ([place]) 06/06/2019  . Intractable back pain 06/06/2019  . COPD (chronic obstructive pulmonary disease) ([place])   . Chronic kidney insufficiency   . Pulmonary nodule 05/19/2018  . Bronchiectasis ([place]) 05/19/2018  . Anemia 09/17/2017  . Abnormal CT of the abdomen 11/02/2016  . Loss of weight 11/02/2016  . History of colonic polyps 11/02/2016  . GERD (gastroesophageal reflux disease) 10/01/2015    Past Surgical History:  Procedure Laterality Date  . BACK SURGERY    . CATARACT EXTRACTION W/PHACO Right 03/09/2016   Procedure: CATARACT EXTRACTION PHACO AND INTRAOCULAR LENS PLACEMENT RIGHT EYE CDE=8.58;  Surgeon: Tonny Branch, MD;  Location: [facility];  Service: Ophthalmology;  Laterality: Right;  right  . CATARACT EXTRACTION W/PHACO Left 04/13/2016   Procedure: CATARACT EXTRACTION PHACO AND INTRAOCULAR LENS PLACEMENT (IOC);  Surgeon: Tonny Branch, MD;  Location: [facility];  Service: Ophthalmology;  Laterality: Left;  CDE: 8.08  .  KYPHOPLASTY Bilateral 06/09/2019   Procedure: T11 KYPHOPLASTY;  Surgeon: Consuella Lose, MD;  Location: [facility];  Service: Neurosurgery;  Laterality: Bilateral;  . None to Date  10/01/15       Family History  Problem Relation Age of Onset  . Kidney disease Mother   . Kidney disease Sister   . Colon cancer Neg Hx   . Gastric cancer Neg  Hx   . Esophageal cancer Neg Hx     Social History   Tobacco Use  . Smoking status: Former Smoker    Years: 15.00    Types: Cigarettes, Cigars    Quit date: 10/01/1990    Years since quitting: 30.0  . Smokeless tobacco: Never Used  . Tobacco comment: Quit x 25-30 years; 2 cigars daily when smoked  Vaping Use  . Vaping Use: Never used  Substance Use Topics  . Alcohol use: Not Currently    Alcohol/week: 0.0 standard drinks  . Drug use: No    Home Medications Prior to Admission medications   Medication Sig Start Date End Date Taking? Authorizing Provider  acetaminophen (TYLENOL) 325 MG tablet Take 2 tablets (650 mg total) by mouth every 6 (six) hours as needed for mild pain, fever or headache (or Fever >/= 101). 04/04/20  Yes Emokpae, Courage, MD  albuterol (VENTOLIN HFA) 108 (90 Base) MCG/ACT inhaler Inhale 2 puffs into the lungs every 4 (four) hours as needed for wheezing or shortness of breath. 04/04/20  Yes Emokpae, Courage, MD  azaCITIDine 5 mg/2 mLs in lactated ringers infusion Inject 75 mg/m2 into the vein daily. Days 1-7 every 28 days 09/16/20  Yes [provider]  ondansetron (ZOFRAN) 4 MG tablet Take 1 tablet (4 mg total) by mouth every 6 (six) hours as needed for nausea or vomiting. 12/24/96  Yes Delora Fuel, MD  pantoprazole (PROTONIX) 40 MG tablet Take 40 mg by mouth daily. 06/21/19  Yes [provider]  prochlorperazine (COMPAZINE) 10 MG tablet Take 1 tablet (10 mg total) by mouth every 6 (six) hours as needed for nausea or vomiting. 09/13/20  Yes Derek Jack, MD  tamsulosin (FLOMAX) 0.4 MG CAPS capsule Take 1 capsule (0.4 mg total) by mouth daily after supper. 04/04/20  Yes Emokpae, Courage, MD  tiotropium (SPIRIVA HANDIHALER) 18 MCG inhalation capsule Place 1 capsule (18 mcg total) into inhaler and inhale daily. 04/04/20  Yes Emokpae, Courage, MD  doxycycline (VIBRA-TABS) 100 MG tablet Take 100 mg by mouth 2 (two) times daily. Patient not taking: No  sig reported 08/26/20   [provider]  epoetin alfa (PROCRIT) 33825 UNIT/ML injection Inject 1 mL (10,000 Units total) into the skin once a week. Patient not taking: Reported on 10/01/2020 04/04/20   Roxan Hockey, MD  tamsulosin (FLOMAX) 0.4 MG CAPS capsule Take 1 capsule by mouth daily. Patient not taking: No sig reported 08/23/20 08/23/21  [provider]    Allergies    Patient has no known allergies.  Review of Systems   Review of Systems  All other systems reviewed and are negative.   Physical Exam Updated Vital Signs BP 124/79   Pulse 69   Temp 97.8 F (36.6 C) (Oral)   Resp 18   Ht 6\' 4"  (1.93 m)   Wt 62.1 kg   SpO2 100%   BMI 16.68 kg/m   Physical Exam Vitals and nursing note reviewed. Exam conducted with a chaperone present.  Constitutional:      General: He is not in acute distress.  Appearance: He is ill-appearing.  HENT:     Head: Normocephalic and atraumatic.  Eyes:     General: No scleral icterus.    Extraocular Movements: Extraocular movements intact.     Conjunctiva/sclera: Conjunctivae normal.     Pupils: Pupils are equal, round, and reactive to light.     Comments: Mild pallor.  Cardiovascular:     Rate and Rhythm: Normal rate.     Pulses: Normal pulses.  Pulmonary:     Effort: Pulmonary effort is normal.  Abdominal:     General: Abdomen is flat. There is no distension.     Palpations: Abdomen is soft.     Tenderness: There is no abdominal tenderness.  Musculoskeletal:        General: Normal range of motion.     Cervical back: Normal range of motion. No rigidity.  Skin:    General: Skin is dry.     Coloration: Skin is pale.  Neurological:     Mental Status: He is alert.     GCS: GCS eye subscore is 4. GCS verbal subscore is 5. GCS motor subscore is 6.  Psychiatric:        Mood and Affect: Mood normal.        Behavior: Behavior normal.        Thought Content: Thought content normal.     ED Results / Procedures /  Treatments   Labs (all labs ordered are listed, but only abnormal results are displayed) Labs Reviewed - No data to display  EKG None  Radiology No results found.  Procedures Procedures   Medications Ordered in ED Medications  0.9 %  sodium chloride infusion (Manually program via Guardrails IV Fluids) ( Intravenous Stopped 10/01/20 1757)    ED Course  I have reviewed the triage vital signs and the nursing notes.  Pertinent labs & imaging results that were available during my care of the patient were reviewed by me and considered in my medical decision making (see chart for details).    MDM Rules/Calculators/A&P                          [name] was evaluated in Emergency Department on 10/01/2020 for the symptoms described in the history of present illness. He was evaluated in the context of the global COVID-19 pandemic, which necessitated consideration that the patient might be at risk for infection with the SARS-CoV-2 virus that causes COVID-19. Institutional protocols and algorithms that pertain to the evaluation of patients at risk for COVID-19 are in a state of rapid change based on information released by regulatory bodies including the CDC and federal and state organizations. These policies and algorithms were followed during the patient's care in the ED.  I personally reviewed patient's medical chart and all notes from triage and staff during today's encounter. I have also ordered and reviewed all labs and imaging that I felt to be medically necessary in the evaluation of this patient's complaints and with consideration of their physical exam. If needed, translation services were available and utilized.   Laboratory work-up had already been obtained prior to arriving in the ED.  His labs were just obtained 2 hours prior to my examination.  Do not need repeat.  Work-up was most notable for hemoglobin of 6.1.  I agree with Dr. Delton Coombes that given his generalized weakness  beyond his usual baseline, will transfuse 2 units PRBCs for symptomatic anemia.  Patient is hemodynamically stable.  Blood pressure is just mildly soft at 94/56.  He will receive 50 cc/h NS along with his 2 units PRBCs.  Dr. Delton Coombes anticipated that he would be reasonable for discharge after completion of blood products.  On reexamination, patient's blood pressure has improved to 124/79 he is resting comfortably, no acute distress.  He is halfway through his first unit of PRBCs and after speaking with RN, anticipate that he will complete second unit PRBC around 9 PM at which point he will be reasonable for discharge if feeling improved.  Patient to be reassessed after completion of blood transfusion, consider orthostatic blood pressure.  If he is steady with ambulation and is feeling well, I believe that discharge home is reasonable.    At shift change care was transferred to [facility] who will follow pending studies, re-evaluate, and determine disposition.     Final Clinical Impression(s) / ED Diagnoses Final diagnoses:  Symptomatic anemia    Rx / DC Orders ED Discharge Orders    None       Corena Herter, PA-C 10/01/20 1858    Daleen Bo, MD 10/02/20 [phone]

## 2020-10-01 NOTE — Progress Notes (Signed)
Patient's hgb 6.1 today and had passed out at home last evening.  Was seen in the ED and opted to wait until appointment today.  Per Dr Delton Coombes, he will be taken back to the ER for transfusion and possible admission.  Report called to Meyer Russel, RN.  Will transport him via wheelchair.  No acute distress noted at this time.

## 2020-10-02 ENCOUNTER — Encounter (HOSPITAL_COMMUNITY): Payer: Medicare Other

## 2020-10-02 LAB — BPAM RBC
Blood Product Expiration Date: 202206022359
Blood Product Expiration Date: 202206032359
ISSUE DATE / TIME: 202205171745
ISSUE DATE / TIME: 202205171929
Unit Type and Rh: 1700
Unit Type and Rh: 1700

## 2020-10-02 LAB — TYPE AND SCREEN
ABO/RH(D): B NEG
Antibody Screen: NEGATIVE
Unit division: 0
Unit division: 0

## 2020-10-03 NOTE — Progress Notes (Signed)
CRITICAL VALUE STICKER  CRITICAL VALUE:  hgb 6.1   RECEIVER (on-site recipient of call):  A. Ouida Sills, RN  DATE & TIME NOTIFIED: 10/01/2020 at 51 MD NOTIFIED:   RESPONSE: transport to ED for blood transfusion 2 units PRBC

## 2020-10-04 NOTE — Patient Instructions (Signed)
[name]  10/04/2020     @PREFPERIOPPHARMACY @   Your procedure is scheduled on  10/11/2020   Report to [facility] at  [email or web address].   Call this number if you have problems the morning of surgery:  [phone]   Remember:  Do not eat or drink after midnight.                         Take these medicines the morning of surgery with A SIP OF WATER     Protonix, flomax, zofran or compazine (if needed).  Use your inhalers before you come and bring your rescue inhaler with you.    Place clean sheets on your bed the night before your procedure and DO NOT sleep with pets this night.  Shower with CHG the night before and the  Morning of your procedure. DO NOT use CHG on your face, hair or genitals.  After each shower, dry off with a clean towel, put on clean, comfortable clothes and brush your teeth.       Do not wear jewelry, make-up or nail polish.  Do not wear lotions, powders, or perfumes, or deodorant.  Do not shave 48 hours prior to surgery.  Men may shave face and neck.  Do not bring valuables to the hospital.  [facility] is not responsible for any belongings or valuables.  Contacts, dentures or bridgework may not be worn into surgery.  Leave your suitcase in the car.  After surgery it may be brought to your room.  For patients admitted to the hospital, discharge time will be determined by your treatment team.  Patients discharged the day of surgery will not be allowed to drive home and must have someone with them for 24 hours.      Special instructions:  DO NOT smoke tobacco or vape for 24 hours before your procedure.  Please read over the following fact sheets that you were given. Coughing and Deep Breathing, Surgical Site Infection Prevention, Anesthesia Post-op Instructions and Care and Recovery After Surgery       Implanted [facility] Guide An implanted port is a device that is placed under the skin. It is usually placed in the chest. The  device can be used to give IV medicine, to take blood, or for dialysis. You may have an implanted port if:  You need IV medicine that would be irritating to the small veins in your hands or arms.  You need IV medicines, such as antibiotics, for a long period of time.  You need IV nutrition for a long period of time.  You need dialysis. When you have a port, your health care provider can choose to use the port instead of veins in your arms for these procedures. You may have fewer limitations when using a port than you would if you used other types of long-term IVs, and you will likely be able to return to normal activities after your incision heals. An implanted port has two main parts:  Reservoir. The reservoir is the part where a needle is inserted to give medicines or draw blood. The reservoir is round. After it is placed, it appears as a small, raised area under your skin.  Catheter. The catheter is a thin, flexible tube that connects the reservoir to a vein. Medicine that is inserted into the reservoir goes into the catheter and then into the vein. How is  my port accessed? To access your port:  A numbing cream may be placed on the skin over the port site.  Your health care provider will put on a mask and sterile gloves.  The skin over your port will be cleaned carefully with a germ-killing soap and allowed to dry.  Your health care provider will gently pinch the port and insert a needle into it.  Your health care provider will check for a blood return to make sure the port is in the vein and is not clogged.  If your port needs to remain accessed to get medicine continuously (constant infusion), your health care provider will place a clear bandage (dressing) over the needle site. The dressing and needle will need to be changed every week, or as told by your health care provider. What is flushing? Flushing helps keep the port from getting clogged. Follow instructions from your health  care provider about how and when to flush the port. Ports are usually flushed with saline solution or a medicine called heparin. The need for flushing will depend on how the port is used:  If the port is only used from time to time to give medicines or draw blood, the port may need to be flushed: ? Before and after medicines have been given. ? Before and after blood has been drawn. ? As part of routine maintenance. Flushing may be recommended every 4-6 weeks.  If a constant infusion is running, the port may not need to be flushed.  Throw away any syringes in a disposal container that is meant for sharp items (sharps container). You can buy a sharps container from a pharmacy, or you can make one by using an empty hard plastic bottle with a cover. How long will my port stay implanted? The port can stay in for as long as your health care provider thinks it is needed. When it is time for the port to come out, a surgery will be done to remove it. The surgery will be similar to the procedure that was done to put the port in. Follow these instructions at home:  Flush your port as told by your health care provider.  If you need an infusion over several days, follow instructions from your health care provider about how to take care of your port site. Make sure you: ? Wash your hands with soap and water before you change your dressing. If soap and water are not available, use alcohol-based hand sanitizer. ? Change your dressing as told by your health care provider. ? Place any used dressings or infusion bags into a plastic bag. Throw that bag in the trash. ? Keep the dressing that covers the needle clean and dry. Do not get it wet. ? Do not use scissors or sharp objects near the tube. ? Keep the tube clamped, unless it is being used.  Check your port site every day for signs of infection. Check for: ? Redness, swelling, or pain. ? Fluid or blood. ? Pus or a bad smell.  Protect the skin around the  port site. ? Avoid wearing bra straps that rub or irritate the site. ? Protect the skin around your port from seat belts. Place a soft pad over your chest if needed.  Bathe or shower as told by your health care provider. The site may get wet as long as you are not actively receiving an infusion.  Return to your normal activities as told by your health care provider. Ask your health  care provider what activities are safe for you.  Carry a medical alert card or wear a medical alert bracelet at all times. This will let health care providers know that you have an implanted port in case of an emergency.   Get help right away if:  You have redness, swelling, or pain at the port site.  You have fluid or blood coming from your port site.  You have pus or a bad smell coming from the port site.  You have a fever. Summary  Implanted ports are usually placed in the chest for long-term IV access.  Follow instructions from your health care provider about flushing the port and changing bandages (dressings).  Take care of the area around your port by avoiding clothing that puts pressure on the area, and by watching for signs of infection.  Protect the skin around your port from seat belts. Place a soft pad over your chest if needed.  Get help right away if you have a fever or you have redness, swelling, pain, drainage, or a bad smell at the port site. This information is not intended to replace advice given to you by your health care provider. Make sure you discuss any questions you have with your health care provider. Document Revised: 09/18/2019 Document Reviewed: 09/18/2019 Elsevier Patient Education  2021 [place] Insertion, Care After This sheet gives you information about how to care for yourself after your procedure. Your health care provider may also give you more specific instructions. If you have problems or questions, contact your health care provider. What can I  expect after the procedure? After the procedure, it is common to have:  Discomfort at the port insertion site.  Bruising on the skin over the port. This should improve over 3-4 days. Follow these instructions at home: [facility] care  After your port is placed, you will get a manufacturer's information card. The card has information about your port. Keep this card with you at all times.  Take care of the port as told by your health care provider. Ask your health care provider if you or a family member can get training for taking care of the port at home. A home health care nurse may also take care of the port.  Make sure to remember what type of port you have. Incision care  Follow instructions from your health care provider about how to take care of your port insertion site. Make sure you: ? Wash your hands with soap and water before and after you change your bandage (dressing). If soap and water are not available, use hand sanitizer. ? Change your dressing as told by your health care provider. ? Leave stitches (sutures), skin glue, or adhesive strips in place. These skin closures may need to stay in place for 2 weeks or longer. If adhesive strip edges start to loosen and curl up, you may trim the loose edges. Do not remove adhesive strips completely unless your health care provider tells you to do that.  Check your port insertion site every day for signs of infection. Check for: ? Redness, swelling, or pain. ? Fluid or blood. ? Warmth. ? Pus or a bad smell.      Activity  Return to your normal activities as told by your health care provider. Ask your health care provider what activities are safe for you.  Do not lift anything that is heavier than 10 lb (4.5 kg), or the limit that you are told, until  your health care provider says that it is safe. General instructions  Take over-the-counter and prescription medicines only as told by your health care provider.  Do not take baths, swim,  or use a hot tub until your health care provider approves. Ask your health care provider if you may take showers. You may only be allowed to take sponge baths.  Do not drive for 24 hours if you were given a sedative during your procedure.  Wear a medical alert bracelet in case of an emergency. This will tell any health care providers that you have a port.  Keep all follow-up visits as told by your health care provider. This is important. Contact a health care provider if:  You cannot flush your port with saline as directed, or you cannot draw blood from the port.  You have a fever or chills.  You have redness, swelling, or pain around your port insertion site.  You have fluid or blood coming from your port insertion site.  Your port insertion site feels warm to the touch.  You have pus or a bad smell coming from the port insertion site. Get help right away if:  You have chest pain or shortness of breath.  You have bleeding from your port that you cannot control. Summary  Take care of the port as told by your health care provider. Keep the manufacturer's information card with you at all times.  Change your dressing as told by your health care provider.  Contact a health care provider if you have a fever or chills or if you have redness, swelling, or pain around your port insertion site.  Keep all follow-up visits as told by your health care provider. This information is not intended to replace advice given to you by your health care provider. Make sure you discuss any questions you have with your health care provider. Document Revised: 11/30/2017 Document Reviewed: 11/30/2017 Elsevier Patient Education  2021 [place] After This sheet gives you information about how to care for yourself after your procedure. Your health care provider may also give you more specific instructions. If you have problems or questions, contact your health care  provider. What can I expect after the procedure? After the procedure, it is common to have:  Tiredness.  Forgetfulness about what happened after the procedure.  Impaired judgment for important decisions.  Nausea or vomiting.  Some difficulty with balance. Follow these instructions at home: For the time period you were told by your health care provider:  Rest as needed.  Do not participate in activities where you could fall or become injured.  Do not drive or use machinery.  Do not drink alcohol.  Do not take sleeping pills or medicines that cause drowsiness.  Do not make important decisions or sign legal documents.  Do not take care of children on your own.      Eating and drinking  Follow the diet that is recommended by your health care provider.  Drink enough fluid to keep your urine pale yellow.  If you vomit: ? Drink water, juice, or soup when you can drink without vomiting. ? Make sure you have little or no nausea before eating solid foods. General instructions  Have a responsible adult stay with you for the time you are told. It is important to have someone help care for you until you are awake and alert.  Take over-the-counter and prescription medicines only as told by your health care provider.  If you have sleep apnea, surgery and certain medicines can increase your risk for breathing problems. Follow instructions from your health care provider about wearing your sleep device: ? Anytime you are sleeping, including during daytime naps. ? While taking prescription pain medicines, sleeping medicines, or medicines that make you drowsy.  Avoid smoking.  Keep all follow-up visits as told by your health care provider. This is important. Contact a health care provider if:  You keep feeling nauseous or you keep vomiting.  You feel light-headed.  You are still sleepy or having trouble with balance after 24 hours.  You develop a rash.  You have a  fever.  You have redness or swelling around the IV site. Get help right away if:  You have trouble breathing.  You have new-onset confusion at home. Summary  For several hours after your procedure, you may feel tired. You may also be forgetful and have poor judgment.  Have a responsible adult stay with you for the time you are told. It is important to have someone help care for you until you are awake and alert.  Rest as told. Do not drive or operate machinery. Do not drink alcohol or take sleeping pills.  Get help right away if you have trouble breathing, or if you suddenly become confused. This information is not intended to replace advice given to you by your health care provider. Make sure you discuss any questions you have with your health care provider. Document Revised: 01/18/2020 Document Reviewed: 04/06/2019 Elsevier Patient Education  2021 [employer].

## 2020-10-08 ENCOUNTER — Encounter (HOSPITAL_COMMUNITY)
Admission: RE | Admit: 2020-10-08 | Discharge: 2020-10-08 | Disposition: A | Discharge status: Home / Self Care | Payer: Medicare Other | Source: Ambulatory Visit | Attending: General Surgery | Admitting: General Surgery

## 2020-10-08 ENCOUNTER — Other Ambulatory Visit: Payer: Self-pay

## 2020-10-09 ENCOUNTER — Other Ambulatory Visit: Payer: Self-pay

## 2020-10-09 ENCOUNTER — Encounter (HOSPITAL_COMMUNITY): Payer: Self-pay

## 2020-10-09 ENCOUNTER — Other Ambulatory Visit (HOSPITAL_COMMUNITY)
Admission: RE | Admit: 2020-10-09 | Discharge: 2020-10-09 | Disposition: A | Discharge status: Home / Self Care | Payer: Medicare Other | Source: Ambulatory Visit | Attending: General Surgery | Admitting: General Surgery

## 2020-10-09 DIAGNOSIS — Z20822 Contact with and (suspected) exposure to covid-19: Secondary | ICD-10-CM | POA: Diagnosis not present

## 2020-10-09 DIAGNOSIS — Z01812 Encounter for preprocedural laboratory examination: Secondary | ICD-10-CM | POA: Diagnosis not present

## 2020-10-09 LAB — CBC WITH DIFFERENTIAL/PLATELET
Abs Immature Granulocytes: 0.07 10*3/uL (ref 0.00–0.07)
Basophils Absolute: 0 10*3/uL (ref 0.0–0.1)
Basophils Relative: 1 %
Eosinophils Absolute: 0 10*3/uL (ref 0.0–0.5)
Eosinophils Relative: 0 %
HCT: 25.2 % — ABNORMAL LOW (ref 39.0–52.0)
Hemoglobin: 7.5 g/dL — ABNORMAL LOW (ref 13.0–17.0)
Immature Granulocytes: 4 %
Lymphocytes Relative: 22 %
Lymphs Abs: 0.4 10*3/uL — ABNORMAL LOW (ref 0.7–4.0)
MCH: 25.5 pg — ABNORMAL LOW (ref 26.0–34.0)
MCHC: 29.8 g/dL — ABNORMAL LOW (ref 30.0–36.0)
MCV: 85.7 fL (ref 80.0–100.0)
Monocytes Absolute: 0.2 10*3/uL (ref 0.1–1.0)
Monocytes Relative: 8 %
Neutro Abs: 1.3 10*3/uL — ABNORMAL LOW (ref 1.7–7.7)
Neutrophils Relative %: 65 %
Platelets: 33 10*3/uL — ABNORMAL LOW (ref 150–400)
RBC: 2.94 MIL/uL — ABNORMAL LOW (ref 4.22–5.81)
RDW: 19.4 % — ABNORMAL HIGH (ref 11.5–15.5)
WBC: 2 10*3/uL — ABNORMAL LOW (ref 4.0–10.5)
nRBC: 0 % (ref 0.0–0.2)

## 2020-10-09 LAB — BASIC METABOLIC PANEL
Anion gap: 10 (ref 5–15)
BUN: 29 mg/dL — ABNORMAL HIGH (ref 8–23)
CO2: 18 mmol/L — ABNORMAL LOW (ref 22–32)
Calcium: 9 mg/dL (ref 8.9–10.3)
Chloride: 107 mmol/L (ref 98–111)
Creatinine, Ser: 1.77 mg/dL — ABNORMAL HIGH (ref 0.61–1.24)
GFR, Estimated: 36 mL/min — ABNORMAL LOW (ref >60)
Glucose, Bld: 82 mg/dL (ref 70–99)
Potassium: 3.9 mmol/L (ref 3.5–5.1)
Sodium: 135 mmol/L (ref 135–145)

## 2020-10-09 NOTE — Pre-Procedure Instructions (Signed)
Late entry. 10/08/2020 @ 1530 patent arrived for PAT. He states, " I don't recall anyone telling me that I was going to go to sleep for this procedure or telling me that I needed to have someone drive me home or stay with me and I don't have anyone to do that." Explained reasons behind all of the above and patient was still adamant that he had no one and that "this would not work for him". [place] director in to talk with patient who still insisted that there was no one. Dr.  [name], surgeon contacted and will look into this. I told patient to expect a phone call from from one of [place] tomorrow concerning surgery.

## 2020-10-09 NOTE — Pre-Procedure Instructions (Signed)
Dr Constance Haw will keep patient as overnight in bed due to lack of anyone to stay with him after surgery. Netty Starring @ Dr Constance Haw office is contacting scheduling and I contacted patient. [name] verbalizes understanding of staying over night and was very appreciative. He will come today for COVID and lab work @ 1430.

## 2020-10-10 LAB — SARS CORONAVIRUS 2 (TAT 6-24 HRS): SARS Coronavirus 2: NEGATIVE

## 2020-10-11 ENCOUNTER — Ambulatory Visit (HOSPITAL_COMMUNITY): Payer: Medicare Other | Admitting: Anesthesiology

## 2020-10-11 ENCOUNTER — Ambulatory Visit (HOSPITAL_COMMUNITY): Payer: Medicare Other

## 2020-10-11 ENCOUNTER — Encounter (HOSPITAL_COMMUNITY)
Admission: RE | Disposition: A | Discharge status: Home / Self Care | Payer: Self-pay | Source: Home / Self Care | Attending: General Surgery

## 2020-10-11 ENCOUNTER — Other Ambulatory Visit: Payer: Self-pay

## 2020-10-11 ENCOUNTER — Encounter (HOSPITAL_COMMUNITY): Payer: Self-pay | Admitting: General Surgery

## 2020-10-11 ENCOUNTER — Observation Stay (HOSPITAL_COMMUNITY)
Admission: RE | Admit: 2020-10-11 | Discharge: 2020-10-12 | Disposition: A | Discharge status: Home / Self Care | Payer: Medicare Other | Attending: General Surgery | Admitting: General Surgery

## 2020-10-11 DIAGNOSIS — J449 Chronic obstructive pulmonary disease, unspecified: Secondary | ICD-10-CM | POA: Insufficient documentation

## 2020-10-11 DIAGNOSIS — Z95828 Presence of other vascular implants and grafts: Secondary | ICD-10-CM

## 2020-10-11 DIAGNOSIS — D469 Myelodysplastic syndrome, unspecified: Principal | ICD-10-CM | POA: Diagnosis present

## 2020-10-11 DIAGNOSIS — Z85830 Personal history of malignant neoplasm of bone: Secondary | ICD-10-CM | POA: Insufficient documentation

## 2020-10-11 DIAGNOSIS — Z87891 Personal history of nicotine dependence: Secondary | ICD-10-CM | POA: Diagnosis not present

## 2020-10-11 DIAGNOSIS — D696 Thrombocytopenia, unspecified: Secondary | ICD-10-CM | POA: Diagnosis not present

## 2020-10-11 DIAGNOSIS — J984 Other disorders of lung: Secondary | ICD-10-CM | POA: Diagnosis not present

## 2020-10-11 DIAGNOSIS — Z452 Encounter for adjustment and management of vascular access device: Secondary | ICD-10-CM | POA: Diagnosis not present

## 2020-10-11 HISTORY — PX: PORTACATH PLACEMENT: SHX2246

## 2020-10-11 SURGERY — INSERTION, TUNNELED CENTRAL VENOUS DEVICE, WITH PORT
Anesthesia: General | Site: Chest | Laterality: Left

## 2020-10-11 MED ORDER — CHLORHEXIDINE GLUCONATE CLOTH 2 % EX PADS: 6.0000 | MEDICATED_PAD | Freq: Once | CUTANEOUS | Status: DC

## 2020-10-11 MED ORDER — UMECLIDINIUM BROMIDE 62.5 MCG/INH IN AEPB
1.0000 | INHALATION_SPRAY | Freq: Every day | RESPIRATORY_TRACT | Status: DC
  Administered 2020-10-12: 1 via RESPIRATORY_TRACT
  Filled 2020-10-11 (×2): qty 7

## 2020-10-11 MED ORDER — CHLORHEXIDINE GLUCONATE CLOTH 2 % EX PADS
6.0000 | MEDICATED_PAD | Freq: Every day | CUTANEOUS | Status: DC
  Administered 2020-10-11 – 2020-10-12 (×2): 6 via TOPICAL

## 2020-10-11 MED ORDER — TAMSULOSIN HCL 0.4 MG PO CAPS
0.4000 mg | ORAL_CAPSULE | Freq: Every day | ORAL | Status: DC
  Administered 2020-10-11: 0.4 mg via ORAL
  Filled 2020-10-11: qty 1

## 2020-10-11 MED ORDER — CEFAZOLIN SODIUM-DEXTROSE 2-4 GM/100ML-% IV SOLN
2.0000 g | INTRAVENOUS | Status: AC
  Administered 2020-10-11: 2 g via INTRAVENOUS
  Filled 2020-10-11: qty 100

## 2020-10-11 MED ORDER — LACTATED RINGERS IV SOLN: INTRAVENOUS | Status: DC

## 2020-10-11 MED ORDER — PROPOFOL 10 MG/ML IV BOLUS
INTRAVENOUS | Status: AC
  Filled 2020-10-11: qty 40

## 2020-10-11 MED ORDER — ACETAMINOPHEN 325 MG PO TABS: 650.0000 mg | ORAL_TABLET | Freq: Four times a day (QID) | ORAL | Status: DC | PRN

## 2020-10-11 MED ORDER — PROCHLORPERAZINE MALEATE 5 MG PO TABS: 10.0000 mg | ORAL_TABLET | Freq: Four times a day (QID) | ORAL | Status: DC | PRN

## 2020-10-11 MED ORDER — METOPROLOL TARTRATE 5 MG/5ML IV SOLN
5.0000 mg | Freq: Four times a day (QID) | INTRAVENOUS | Status: DC | PRN
Start: 2020-10-11 — End: 2020-10-12

## 2020-10-11 MED ORDER — PROPOFOL 500 MG/50ML IV EMUL
INTRAVENOUS | Status: DC | PRN
  Administered 2020-10-11: 125 ug/kg/min via INTRAVENOUS

## 2020-10-11 MED ORDER — PANTOPRAZOLE SODIUM 40 MG PO TBEC
40.0000 mg | DELAYED_RELEASE_TABLET | Freq: Every day | ORAL | Status: DC
  Administered 2020-10-11 – 2020-10-12 (×2): 40 mg via ORAL
  Filled 2020-10-11 (×2): qty 1

## 2020-10-11 MED ORDER — SODIUM CHLORIDE (PF) 0.9 % IJ SOLN
INTRAMUSCULAR | Status: DC | PRN
  Administered 2020-10-11: 500 mL

## 2020-10-11 MED ORDER — CHLORHEXIDINE GLUCONATE 0.12 % MT SOLN
OROMUCOSAL | Status: AC
  Filled 2020-10-11: qty 15

## 2020-10-11 MED ORDER — HYDROCODONE-ACETAMINOPHEN 5-325 MG PO TABS
1.0000 | ORAL_TABLET | ORAL | Status: DC | PRN
Start: 2020-10-11 — End: 2020-10-12

## 2020-10-11 MED ORDER — PHENYLEPHRINE HCL (PRESSORS) 10 MG/ML IV SOLN
INTRAVENOUS | Status: DC | PRN
  Administered 2020-10-11: 120 ug via INTRAVENOUS
  Administered 2020-10-11: 80 ug via INTRAVENOUS
  Administered 2020-10-11 (×3): 120 ug via INTRAVENOUS
  Administered 2020-10-11: 80 ug via INTRAVENOUS

## 2020-10-11 MED ORDER — HEPARIN SOD (PORK) LOCK FLUSH 100 UNIT/ML IV SOLN
INTRAVENOUS | Status: DC | PRN
  Administered 2020-10-11: 500 [IU] via INTRAVENOUS

## 2020-10-11 MED ORDER — LIDOCAINE HCL (PF) 1 % IJ SOLN
INTRAMUSCULAR | Status: DC | PRN
Start: 2020-10-11 — End: 2020-10-11
  Administered 2020-10-11: 10 mL

## 2020-10-11 MED ORDER — ORAL CARE MOUTH RINSE: 15.0000 mL | Freq: Once | OROMUCOSAL | Status: AC

## 2020-10-11 MED ORDER — PHENYLEPHRINE 40 MCG/ML (10ML) SYRINGE FOR IV PUSH (FOR BLOOD PRESSURE SUPPORT)
PREFILLED_SYRINGE | INTRAVENOUS | Status: AC
  Filled 2020-10-11: qty 10

## 2020-10-11 MED ORDER — TIOTROPIUM BROMIDE MONOHYDRATE 18 MCG IN CAPS: 1.0000 | ORAL_CAPSULE | Freq: Every day | RESPIRATORY_TRACT | Status: DC

## 2020-10-11 MED ORDER — CHLORHEXIDINE GLUCONATE 0.12 % MT SOLN
15.0000 mL | Freq: Once | OROMUCOSAL | Status: AC
  Administered 2020-10-11: 15 mL via OROMUCOSAL
  Filled 2020-10-11: qty 15

## 2020-10-11 MED ORDER — DIPHENHYDRAMINE HCL 50 MG/ML IJ SOLN: 12.5000 mg | Freq: Four times a day (QID) | INTRAMUSCULAR | Status: DC | PRN

## 2020-10-11 MED ORDER — ALBUTEROL SULFATE HFA 108 (90 BASE) MCG/ACT IN AERS: 2.0000 | INHALATION_SPRAY | RESPIRATORY_TRACT | Status: DC | PRN

## 2020-10-11 MED ORDER — HEPARIN SOD (PORK) LOCK FLUSH 100 UNIT/ML IV SOLN
INTRAVENOUS | Status: AC
  Filled 2020-10-11: qty 5

## 2020-10-11 MED ORDER — SIMETHICONE 80 MG PO CHEW: 40.0000 mg | CHEWABLE_TABLET | Freq: Four times a day (QID) | ORAL | Status: DC | PRN

## 2020-10-11 MED ORDER — LIDOCAINE HCL (PF) 1 % IJ SOLN
INTRAMUSCULAR | Status: AC
  Filled 2020-10-11: qty 30

## 2020-10-11 MED ORDER — DIPHENHYDRAMINE HCL 12.5 MG/5ML PO ELIX: 12.5000 mg | ORAL_SOLUTION | Freq: Four times a day (QID) | ORAL | Status: DC | PRN

## 2020-10-11 MED ORDER — MORPHINE SULFATE (PF) 2 MG/ML IV SOLN: 2.0000 mg | INTRAVENOUS | Status: DC | PRN

## 2020-10-11 MED ORDER — FENTANYL CITRATE (PF) 100 MCG/2ML IJ SOLN: 25.0000 ug | INTRAMUSCULAR | Status: DC | PRN

## 2020-10-11 SURGICAL SUPPLY — 33 items
ADH SKN CLS APL DERMABOND .7 (GAUZE/BANDAGES/DRESSINGS) ×2
APL PRP STRL LF ISPRP CHG 10.5 (MISCELLANEOUS) ×1
APPLICATOR CHLORAPREP 10.5 ORG (MISCELLANEOUS) ×2 IMPLANT
BAG DECANTER FOR FLEXI CONT (MISCELLANEOUS) ×2 IMPLANT
CLOTH BEACON ORANGE TIMEOUT ST (SAFETY) ×2 IMPLANT
COVER LIGHT HANDLE STERIS (MISCELLANEOUS) ×4 IMPLANT
COVER PROBE U/S 5X48 (MISCELLANEOUS) ×2 IMPLANT
COVER WAND RF STERILE (DRAPES) ×2 IMPLANT
DECANTER SPIKE VIAL GLASS SM (MISCELLANEOUS) ×2 IMPLANT
DERMABOND ADVANCED (GAUZE/BANDAGES/DRESSINGS) ×2
DERMABOND ADVANCED .7 DNX12 (GAUZE/BANDAGES/DRESSINGS) ×2 IMPLANT
DRAPE C-ARM FOLDED MOBILE STRL (DRAPES) ×2 IMPLANT
ELECT REM PT RETURN 9FT ADLT (ELECTROSURGICAL) ×2
ELECTRODE REM PT RTRN 9FT ADLT (ELECTROSURGICAL) ×1 IMPLANT
GLOVE SURG ENC MOIS LTX SZ6.5 (GLOVE) ×2 IMPLANT
GLOVE SURG UNDER POLY LF SZ6.5 (GLOVE) ×2 IMPLANT
GLOVE SURG UNDER POLY LF SZ7 (GLOVE) ×2 IMPLANT
GOWN STRL REUS W/TWL LRG LVL3 (GOWN DISPOSABLE) ×4 IMPLANT
IV NS 500ML (IV SOLUTION) ×2
IV NS 500ML BAXH (IV SOLUTION) ×1 IMPLANT
KIT PORT POWER 8FR ISP MRI (Port) ×2 IMPLANT
KIT TURNOVER KIT A (KITS) ×2 IMPLANT
MANIFOLD NEPTUNE II (INSTRUMENTS) ×2 IMPLANT
NEEDLE HYPO 25X1 1.5 SAFETY (NEEDLE) ×2 IMPLANT
PACK MINOR (CUSTOM PROCEDURE TRAY) ×2 IMPLANT
PAD ARMBOARD 7.5X6 YLW CONV (MISCELLANEOUS) ×2 IMPLANT
SET BASIN LINEN APH (SET/KITS/TRAYS/PACK) ×2 IMPLANT
SUT MNCRL AB 4-0 PS2 18 (SUTURE) ×2 IMPLANT
SUT PROLENE 2 0 SH 30 (SUTURE) ×2 IMPLANT
SUT VIC AB 3-0 SH 27 (SUTURE) ×2
SUT VIC AB 3-0 SH 27X BRD (SUTURE) ×1 IMPLANT
SYR 10ML LL (SYRINGE) ×4 IMPLANT
SYR CONTROL 10ML LL (SYRINGE) ×2 IMPLANT

## 2020-10-11 NOTE — Progress Notes (Signed)
1115 dietary called for lunch tray for patient and order released. 1200-- dietary called again to follow up on lunch tray to be sent to PACU for patient waiting on bed. 1230-- dietary called by gina Jossie Ng, RN to follow up on  Ordered lunch tray that has not arrived for patient. Patient has been given fluds to drink  1245-- AC called to follow up on lunch tray.  1253-- Patient to floor, dietary notified of patient going to room and he would need his lunch there.

## 2020-10-11 NOTE — Transfer of Care (Signed)
Immediate Anesthesia Transfer of Care Note  Patient: GOLDMAN BIRCHALL  Procedure(s) Performed: INSERTION PORT-A-CATH (attached catheter in left internal jugular) (Left Chest)  Patient Location: PACU  Anesthesia Type:MAC  Level of Consciousness: awake and alert   Airway & Oxygen Therapy: Patient Spontanous Breathing and Patient connected to nasal cannula oxygen  Post-op Assessment: Report given to RN and Post -op Vital signs reviewed and stable  Post vital signs: Reviewed and stable  Last Vitals:  Vitals Value Taken Time  BP 88/54 10/11/20 0921  Temp    Pulse 91 10/11/20 0922  Resp 15 10/11/20 0922  SpO2 88 % 10/11/20 0922  Vitals shown include unvalidated device data.  Last Pain:  Vitals:   10/11/20 0637  PainSc: 0-No pain         Complications: No complications documented.

## 2020-10-11 NOTE — Progress Notes (Signed)
[name]  Patient without family at home to stay with him and O2 requirements at home, thrombocytopenia, port placed today, will monitor overnight for safety reasons given the O2 at home and low platelet count.    CXR ordered and without pneumothorax on my view, read pending.   [name], MD [facility] [address] [place], [place] [address] [phone] (office)

## 2020-10-11 NOTE — Progress Notes (Signed)
Low platelets at 30, was 100 but going down due to disease process and treatments.  Monitoring overnight. Will proceed with port given not a major surgery.  [name], MD

## 2020-10-11 NOTE — Interval H&P Note (Signed)
History and Physical Interval Note:  10/11/2020 7:26 AM  [name]  has presented today for surgery, with the diagnosis of Myelodysplastic syndrome.  The various methods of treatment have been discussed with the patient and family. After consideration of risks, benefits and other options for treatment, the patient has consented to  Procedure(s): INSERTION PORT-A-CATH ([name]) as a surgical intervention.  The patient's history has been reviewed, patient examined, no change in status, stable for surgery.  I have reviewed the patient's chart and labs.  Questions were answered to the patient's satisfaction.    Staying overnight to monitor given O2, etc Virl Cagey

## 2020-10-11 NOTE — Anesthesia Preprocedure Evaluation (Signed)
Anesthesia Evaluation  Patient identified by MRN, date of birth, ID band Patient awake    Reviewed: Allergy & Precautions, NPO status , Patient's Chart, lab work & pertinent test results  Airway Mallampati: II  TM Distance: <3 FB Neck ROM: Full    Dental  (+) Dental Advisory Given, Edentulous Upper, Partial Lower   Pulmonary pneumonia, COPD,  COPD inhaler and oxygen dependent, former smoker,    Pulmonary exam normal breath sounds clear to auscultation       Cardiovascular Exercise Tolerance: Good Normal cardiovascular exam Rhythm:Regular Rate:Normal     Neuro/Psych negative neurological ROS  negative psych ROS   GI/Hepatic Neg liver ROS, GERD  Medicated,  Endo/Other  negative endocrine ROS  Renal/GU Renal InsufficiencyRenal disease     Musculoskeletal  (+) Arthritis  (back pain),   Abdominal   Peds  Hematology  (+) Blood dyscrasia (myelodysplastic syndrome, thrombocytopenia, platelets 33), anemia ,   Anesthesia Other Findings   Reproductive/Obstetrics negative OB ROS                             Anesthesia Physical Anesthesia Plan  ASA: IV  Anesthesia Plan: General   Post-op Pain Management:    Induction: Intravenous  PONV Risk Score and Plan: 3 and Ondansetron and TIVA  Airway Management Planned: Nasal Cannula, Natural Airway and Simple Face Mask  Additional Equipment:   Intra-op Plan:   Post-operative Plan:   Informed Consent: I have reviewed the patients History and Physical, chart, labs and discussed the procedure including the risks, benefits and alternatives for the proposed anesthesia with the patient or authorized representative who has indicated his/her understanding and acceptance.     Dental advisory given  Plan Discussed with: CRNA and Surgeon  Anesthesia Plan Comments:         Anesthesia Quick Evaluation

## 2020-10-11 NOTE — Op Note (Signed)
Operative Note 10/11/20   Preoperative Diagnosis:  Myelodysplastic syndrome, thrombocytopenia    Postoperative Diagnosis: Same   Procedure(s) Performed: Port-A-Cath placement, left internal jugular    Surgeon: Lanell Matar. Constance Haw, MD   Assistants: No qualified resident was available   Anesthesia: Monitored anesthesia care   Anesthesiologist: Dr. Charna Elizabeth    Specimens: None   Estimated Blood Loss: Minimal   Fluoroscopy time: 4 seconds   Blood Replacement: None    Complications: None    Operative Findings:  Normal anatomy   Indications: Mr. Trumbull is a 87 yo who needs a port for treatment for his myelodysplastic syndrome. We discussed placement and risk of bleeding, infection, pneumothorax, injury to vessels.   Procedure: The patient was brought into the operating room and monitored anesthesia care was induced.  One percent lidocaine was used for local anesthesia.   The left chest and neck was prepped and draped in the usual sterile fashion.  Preoperative antibiotics were given.  The needles advanced into the left jugular vein under ultrasound guidance given the thrombocytopenia.  A guidewire was then advanced into the right atrium under fluoroscopic guidance.  Ectopia was not noted. An incision was made below the left clavicle. A subcutaneous pocket was formed and the catheter was tunneled to the left jugular insertion site. An introducer and peel-away sheath were placed over the guidewire. The catheter was then inserted through the peel-away sheath and the peel-away sheath was removed.  A spot film was performed to confirm the position. The catheter was then attached to the port and the port placed in subcutaneous pocket. Adequate positioning was confirmed by fluoroscopy. Hemostasis was confirmed, and the port was secured with 2-0 prolene sutures.  Good backflow of blood was noted on aspiration of the port. The port was flushed with heparin flush. Subcutaneous layer was reapproximated  using a 3-0 Vicryl interrupted suture. The skin was closed using a 4-0 Vicryl subcuticular suture. Dermabond was applied.    All tape and needle counts were correct at the end of the procedure. The patient was transferred to PACU in stable condition. A chest x-ray will be performed at that time.  [name], MD [facility] [address] [place], [place] [address] [phone] (office)

## 2020-10-11 NOTE — Anesthesia Postprocedure Evaluation (Signed)
Anesthesia Post Note  Patient: [name]  Procedure(s) Performed: INSERTION PORT-A-CATH (attached catheter in left internal jugular) (Left Chest)  Patient location during evaluation: PACU Anesthesia Type: General Level of consciousness: awake and alert and oriented Pain management: pain level controlled Vital Signs Assessment: post-procedure vital signs reviewed and stable Respiratory status: spontaneous breathing and respiratory function stable Cardiovascular status: blood pressure returned to baseline and stable Postop Assessment: no apparent nausea or vomiting Anesthetic complications: no   No complications documented.   Last Vitals:  Vitals:   10/11/20 1000 10/11/20 1015  BP: 111/66   Pulse: 90 73  Resp: (!) 26 (!) 22  Temp:    SpO2: 96%     Last Pain:  Vitals:   10/11/20 1000  PainSc: 0-No pain                 Estle Huguley C Alim Cattell

## 2020-10-12 DIAGNOSIS — D469 Myelodysplastic syndrome, unspecified: Secondary | ICD-10-CM | POA: Diagnosis not present

## 2020-10-12 NOTE — Plan of Care (Signed)
  Problem: Education: Goal: Knowledge of General Education information will improve Description Including pain rating scale, medication(s)/side effects and non-pharmacologic comfort measures Outcome: Progressing   Problem: Health Behavior/Discharge Planning: Goal: Ability to manage health-related needs will improve Outcome: Progressing   

## 2020-10-12 NOTE — Discharge Instructions (Signed)
Keep area clean and dry. You can take a shower in 24 hours. Do not submerge in water for 4 weeks.  Take tylenol and ibuprofen for pain control.  Implanted [facility] Guide An implanted port is a device that is placed under the skin. It is usually placed in the chest. The device can be used to give IV medicine, to take blood, or for dialysis. You may have an implanted port if:  You need IV medicine that would be irritating to the small veins in your hands or arms.  You need IV medicines, such as antibiotics, for a long period of time.  You need IV nutrition for a long period of time.  You need dialysis. When you have a port, your health care provider can choose to use the port instead of veins in your arms for these procedures. You may have fewer limitations when using a port than you would if you used other types of long-term IVs, and you will likely be able to return to normal activities after your incision heals. An implanted port has two main parts:  Reservoir. The reservoir is the part where a needle is inserted to give medicines or draw blood. The reservoir is round. After it is placed, it appears as a small, raised area under your skin.  Catheter. The catheter is a thin, flexible tube that connects the reservoir to a vein. Medicine that is inserted into the reservoir goes into the catheter and then into the vein. How is my port accessed? To access your port:  A numbing cream may be placed on the skin over the port site.  Your health care provider will put on a mask and sterile gloves.  The skin over your port will be cleaned carefully with a germ-killing soap and allowed to dry.  Your health care provider will gently pinch the port and insert a needle into it.  Your health care provider will check for a blood return to make sure the port is in the vein and is not clogged.  If your port needs to remain accessed to get medicine continuously (constant infusion), your health care  provider will place a clear bandage (dressing) over the needle site. The dressing and needle will need to be changed every week, or as told by your health care provider. What is flushing? Flushing helps keep the port from getting clogged. Follow instructions from your health care provider about how and when to flush the port. Ports are usually flushed with saline solution or a medicine called heparin. The need for flushing will depend on how the port is used:  If the port is only used from time to time to give medicines or draw blood, the port may need to be flushed: ? Before and after medicines have been given. ? Before and after blood has been drawn. ? As part of routine maintenance. Flushing may be recommended every 4-6 weeks.  If a constant infusion is running, the port may not need to be flushed.  Throw away any syringes in a disposal container that is meant for sharp items (sharps container). You can buy a sharps container from a pharmacy, or you can make one by using an empty hard plastic bottle with a cover. How long will my port stay implanted? The port can stay in for as long as your health care provider thinks it is needed. When it is time for the port to come out, a surgery will be done to remove it. The  surgery will be similar to the procedure that was done to put the port in. Follow these instructions at home:  Flush your port as told by your health care provider.  If you need an infusion over several days, follow instructions from your health care provider about how to take care of your port site. Make sure you: ? Wash your hands with soap and water before you change your dressing. If soap and water are not available, use alcohol-based hand sanitizer. ? Change your dressing as told by your health care provider. ? Place any used dressings or infusion bags into a plastic bag. Throw that bag in the trash. ? Keep the dressing that covers the needle clean and dry. Do not get it  wet. ? Do not use scissors or sharp objects near the tube. ? Keep the tube clamped, unless it is being used.  Check your port site every day for signs of infection. Check for: ? Redness, swelling, or pain. ? Fluid or blood. ? Pus or a bad smell.  Protect the skin around the port site. ? Avoid wearing bra straps that rub or irritate the site. ? Protect the skin around your port from seat belts. Place a soft pad over your chest if needed.  Bathe or shower as told by your health care provider. The site may get wet as long as you are not actively receiving an infusion.  Return to your normal activities as told by your health care provider. Ask your health care provider what activities are safe for you.  Carry a medical alert card or wear a medical alert bracelet at all times. This will let health care providers know that you have an implanted port in case of an emergency.   Get help right away if:  You have redness, swelling, or pain at the port site.  You have fluid or blood coming from your port site.  You have pus or a bad smell coming from the port site.  You have a fever. Summary  Implanted ports are usually placed in the chest for long-term IV access.  Follow instructions from your health care provider about flushing the port and changing bandages (dressings).  Take care of the area around your port by avoiding clothing that puts pressure on the area, and by watching for signs of infection.  Protect the skin around your port from seat belts. Place a soft pad over your chest if needed.  Get help right away if you have a fever or you have redness, swelling, pain, drainage, or a bad smell at the port site. This information is not intended to replace advice given to you by your health care provider. Make sure you discuss any questions you have with your health care provider. Document Revised: 09/18/2019 Document Reviewed: 09/18/2019 Elsevier Patient Education  [place].

## 2020-10-12 NOTE — Discharge Summary (Signed)
Physician Discharge Summary  Patient ID: [name] MRN: [id number] DOB/AGE: 08/17/1929 87 y.o.  Admit date: 10/11/2020 Discharge date: 10/12/2020  Admission Diagnoses: Myelodysplastic syndrome   Discharge Diagnoses:  Principal Problem:   Myelodysplastic syndrome [facility])   Discharged Condition: good  Hospital Course: Mr. Oestreich is a 87 yo who lives alone on O2 at home, with thrombocytopenia from his myelodysplastic syndrome who required a port placement. He had this yesterday and it was uneventful. We monitored him overnight given his home O2 and thrombocytopenia.  He reports minimal pain and some minor bruising. He has eaten and ambulated. He is ready for dc home.  He says he requires no pain medication for home.   Consults: None  Significant Diagnostic Studies: CXR post op with port in good position   Treatments: Port placement, left IJ 10/11/2020   Discharge Exam: Blood pressure (!) 95/55, pulse 75, temperature 98.2 F (36.8 C), temperature source Oral, resp. rate 18, SpO2 94 %. General appearance: alert, cooperative and no distress Neck: IJ stick with some bruising, no hematoma or swelling, soft Chest wall: no tenderness, port with some bruising  Normal work of breathing   Disposition: Discharge disposition: 01-Home or Self Care       Discharge Instructions    Call MD for:  difficulty breathing, headache or visual disturbances   Complete by: As directed    Call MD for:  extreme fatigue   Complete by: As directed    Call MD for:  persistant dizziness or light-headedness   Complete by: As directed    Call MD for:  persistant nausea and vomiting   Complete by: As directed    Call MD for:  redness, tenderness, or signs of infection (pain, swelling, redness, odor or green/yellow discharge around incision site)   Complete by: As directed    Call MD for:  severe uncontrolled pain   Complete by: As directed    Call MD for:  temperature >100.4   Complete by: As  directed    Diet - low sodium heart healthy   Complete by: As directed    Increase activity slowly   Complete by: As directed      Allergies as of 10/12/2020   No Known Allergies     Medication List    TAKE these medications   acetaminophen 325 MG tablet Commonly known as: TYLENOL Take 2 tablets (650 mg total) by mouth every 6 (six) hours as needed for mild pain, fever or headache (or Fever >/= 101).   albuterol 108 (90 Base) MCG/ACT inhaler Commonly known as: VENTOLIN HFA Inhale 2 puffs into the lungs every 4 (four) hours as needed for wheezing or shortness of breath.   azaCITIDine 5 mg/2 mLs in lactated ringers infusion Inject 75 mg/m2 into the vein daily. Days 1-7 every 28 days   doxycycline 100 MG tablet Commonly known as: VIBRA-TABS Take 100 mg by mouth 2 (two) times daily.   ondansetron 4 MG tablet Commonly known as: ZOFRAN Take 1 tablet (4 mg total) by mouth every 6 (six) hours as needed for nausea or vomiting.   pantoprazole 40 MG tablet Commonly known as: PROTONIX Take 40 mg by mouth daily. Pt no longer taking   prochlorperazine 10 MG tablet Commonly known as: COMPAZINE Take 1 tablet (10 mg total) by mouth every 6 (six) hours as needed for nausea or vomiting.   Procrit 10000 UNIT/ML injection Generic drug: epoetin alfa Inject 1 mL (10,000 Units total) into the skin once a  week.   Spiriva HandiHaler 18 MCG inhalation capsule Generic drug: tiotropium Place 1 capsule (18 mcg total) into inhaler and inhale daily.   tamsulosin 0.4 MG Caps capsule Commonly known as: FLOMAX Take 1 capsule (0.4 mg total) by mouth daily after supper.   tamsulosin 0.4 MG Caps capsule Commonly known as: FLOMAX Take 1 capsule by mouth daily.       Follow-up Information    Virl Cagey, MD Follow up.   Specialty: General Surgery Why: as need if issues  Contact information: [address] [place] [address] [phone]               Signed: Virl Cagey 10/12/2020, 10:28 AM

## 2020-10-12 NOTE — Progress Notes (Signed)
Legal guardian, Stanton Kidney, notified visa phone that her father was being d/c d and that he would be driving himself home. She was agreeable with him driving himself home.

## 2020-10-12 NOTE — Plan of Care (Signed)
  Problem: Education: Goal: Knowledge of General Education information will improve Description: Including pain rating scale, medication(s)/side effects and non-pharmacologic comfort measures 10/12/2020 1402 by Santa Lighter, RN Outcome: Adequate for Discharge 10/12/2020 1401 by Santa Lighter, RN Outcome: Progressing   Problem: Clinical Measurements: Goal: Ability to maintain clinical measurements within normal limits will improve Outcome: Adequate for Discharge Goal: Will remain free from infection Outcome: Adequate for Discharge Goal: Diagnostic test results will improve Outcome: Adequate for Discharge Goal: Respiratory complications will improve Outcome: Adequate for Discharge Goal: Cardiovascular complication will be avoided Outcome: Adequate for Discharge   Problem: Activity: Goal: Risk for activity intolerance will decrease Outcome: Adequate for Discharge   Problem: Nutrition: Goal: Adequate nutrition will be maintained Outcome: Adequate for Discharge   Problem: Coping: Goal: Level of anxiety will decrease Outcome: Adequate for Discharge   Problem: Elimination: Goal: Will not experience complications related to bowel motility Outcome: Adequate for Discharge Goal: Will not experience complications related to urinary retention Outcome: Adequate for Discharge   Problem: Pain Managment: Goal: General experience of comfort will improve Outcome: Adequate for Discharge   Problem: Safety: Goal: Ability to remain free from injury will improve Outcome: Adequate for Discharge   Problem: Skin Integrity: Goal: Risk for impaired skin integrity will decrease Outcome: Adequate for Discharge

## 2020-10-12 NOTE — Progress Notes (Signed)
Nsg Discharge Note  Admit Date:  10/11/2020 Discharge date: 10/12/2020   [name] to be D/C'd Home per MD order.  AVS completed.  Copy for chart, and copy for patient signed, and dated. Patient/caregiver able to verbalize understanding. Removed IV-CDI. Reviewed d/c paperwork with patient and answered all questions. Accompanied patient in scooter to main entrance. He drove himself home.  Discharge Medication: Allergies as of 10/12/2020   No Known Allergies     Medication List    TAKE these medications   acetaminophen 325 MG tablet Commonly known as: TYLENOL Take 2 tablets (650 mg total) by mouth every 6 (six) hours as needed for mild pain, fever or headache (or Fever >/= 101).   albuterol 108 (90 Base) MCG/ACT inhaler Commonly known as: VENTOLIN HFA Inhale 2 puffs into the lungs every 4 (four) hours as needed for wheezing or shortness of breath.   azaCITIDine 5 mg/2 mLs in lactated ringers infusion Inject 75 mg/m2 into the vein daily. Days 1-7 every 28 days   doxycycline 100 MG tablet Commonly known as: VIBRA-TABS Take 100 mg by mouth 2 (two) times daily.   ondansetron 4 MG tablet Commonly known as: ZOFRAN Take 1 tablet (4 mg total) by mouth every 6 (six) hours as needed for nausea or vomiting.   pantoprazole 40 MG tablet Commonly known as: PROTONIX Take 40 mg by mouth daily. Pt no longer taking   prochlorperazine 10 MG tablet Commonly known as: COMPAZINE Take 1 tablet (10 mg total) by mouth every 6 (six) hours as needed for nausea or vomiting.   Procrit 10000 UNIT/ML injection Generic drug: epoetin alfa Inject 1 mL (10,000 Units total) into the skin once a week.   Spiriva HandiHaler 18 MCG inhalation capsule Generic drug: tiotropium Place 1 capsule (18 mcg total) into inhaler and inhale daily.   tamsulosin 0.4 MG Caps capsule Commonly known as: FLOMAX Take 1 capsule (0.4 mg total) by mouth daily after supper.   tamsulosin 0.4 MG Caps capsule Commonly  known as: FLOMAX Take 1 capsule by mouth daily.       Discharge Assessment: Vitals:   10/12/20 0837 10/12/20 0900  BP:  (!) 95/55  Pulse:  75  Resp:  18  Temp:  98.2 F (36.8 C)  SpO2: 92% 94%   Skin clean, dry and intact without evidence of skin break down, no evidence of skin tears noted. IV catheter discontinued intact. Site without signs and symptoms of complications - no redness or edema noted at insertion site, patient denies c/o pain - only slight tenderness at site.  Dressing with slight pressure applied.  D/c Instructions-Education: Discharge instructions given to patient/family with verbalized understanding. D/c education completed with patient/family including follow up instructions, medication list, d/c activities limitations if indicated, with other d/c instructions as indicated by MD - patient able to verbalize understanding, all questions fully answered. Patient instructed to return to ED, call 911, or call MD for any changes in condition.  Patient escorted via [place], and D/C home via private auto.  Santa Lighter, RN 10/12/2020 3:03 PM

## 2020-10-15 ENCOUNTER — Ambulatory Visit (HOSPITAL_COMMUNITY): Payer: Medicare Other

## 2020-10-15 ENCOUNTER — Other Ambulatory Visit (HOSPITAL_COMMUNITY): Payer: Medicare Other

## 2020-10-15 ENCOUNTER — Encounter (HOSPITAL_COMMUNITY): Payer: Self-pay | Admitting: General Surgery

## 2020-10-15 ENCOUNTER — Ambulatory Visit (HOSPITAL_COMMUNITY): Payer: Medicare Other | Admitting: Hematology

## 2020-10-16 ENCOUNTER — Ambulatory Visit (HOSPITAL_COMMUNITY): Payer: Medicare Other

## 2020-10-17 ENCOUNTER — Ambulatory Visit (HOSPITAL_COMMUNITY): Payer: Medicare Other

## 2020-10-18 ENCOUNTER — Ambulatory Visit (HOSPITAL_COMMUNITY): Payer: Medicare Other

## 2020-10-19 ENCOUNTER — Encounter (HOSPITAL_COMMUNITY): Payer: Self-pay | Admitting: Hematology

## 2020-10-20 NOTE — Progress Notes (Signed)
[name], [name]   CLINIC:  Medical Oncology/Hematology  PCP:  Sharilyn Sites, [address] / [place] [address] [phone]   REASON FOR VISIT:  Follow-up for myelodysplastic syndrome.  PRIOR THERAPY: Aranesp, Retacrit, intermittent PRBC transfusions  NGS Results: [employer] Myeloid Panel obtained, results pending  CURRENT THERAPY: Azacitidine (initiated on 09/16/2020)  BRIEF ONCOLOGIC HISTORY:  Oncology History  Myelodysplastic syndrome ([place])  09/16/2020 -  Chemotherapy    Patient is on Treatment Plan: MYELODYSPLASIA  AZACITIDINE IV D1-7 Q28D        CANCER STAGING: Cancer Staging No matching staging information was found for the patient.  INTERVAL HISTORY:  [name], a 87 y.o. male, returns for routine follow-up of his myelodysplastic syndrome. [name] was last seen on 10/01/2020 by Dr. Delton Coombes.  He returns today for cycle #2 of his azacitidine.  At the time of his last visit on 10/01/2020, he was noted to have worsening pancytopenia with labs from 10/01/2020 showing Hgb 6.1, platelets 31, and normal WBC 5.5.  He received PRBC x2 on 10/02/2020.  Labs today (10/21/2020) show WBC 2.8 (ANC 1.9), Hgb 7.0, platelets 78.  CMP shows improved creatinine 1.67 and back to baseline after peaking at 2.13 (09/30/2020).  LDH normal at 125.  At today's visit, he reports feeling very fatigued.  He reports that his current energy level is 50%, and his appetite is 10% but he forces himself to eat.  He has lost about five pounds in the past month.  He drinks 1-2 Ensure supplements each day.  Mr. Mahl reports easy bruising, but denies any current signs of symptoms of bleeding (no epistaxis, hemoptysis, hematemesis, hematochezia, melena, or hematuria).  He denies any recent infections.  His cough and shortness of breath are chronic and stable at baseline.  He denies fever, chills, chest pain, nausea, vomiting, abdominal  pain.  He has not had any further syncopal episodes since his ED visit on 10/01/2020.  However, he does continue to experience lightheadedness and presyncopal feelings when standing from a sitting position.  He does not think that he is dehydrated, reports that he drinks "plenty of water."  His blood pressures tend to run on the lower side, and BP today is 97/52.    REVIEW OF SYSTEMS:  Review of Systems  Constitutional: Positive for appetite change (50%) and fatigue (50%). Negative for chills, diaphoresis, fever and unexpected weight change.  HENT:   Negative for lump/mass and nosebleeds.   Eyes: Negative for eye problems.  Respiratory: Positive for cough (Chronic and stable) and shortness of breath (Chronic and stable). Negative for hemoptysis.   Cardiovascular: Negative for chest pain, leg swelling and palpitations.  Gastrointestinal: Negative for abdominal pain, blood in stool, constipation, diarrhea, nausea and vomiting.  Genitourinary: Negative for hematuria.   Skin: Negative.   Neurological: Positive for light-headedness. Negative for dizziness and headaches.  Hematological: Bruises/bleeds easily.    PAST MEDICAL/SURGICAL HISTORY:  Past Medical History:  Diagnosis Date  . Bone cancer ([place])   . Chronic kidney insufficiency   . COPD (chronic obstructive pulmonary disease) ([place])   . GERD (gastroesophageal reflux disease)   . Pulmonary nodule    Past Surgical History:  Procedure Laterality Date  . BACK SURGERY    . CATARACT EXTRACTION W/PHACO Right 03/09/2016   Procedure: CATARACT EXTRACTION PHACO AND INTRAOCULAR LENS PLACEMENT RIGHT EYE CDE=8.58;  Surgeon: Tonny Branch, MD;  Location: [facility];  Service: Ophthalmology;  Laterality: Right;  right  . CATARACT EXTRACTION W/PHACO Left 04/13/2016   Procedure: CATARACT EXTRACTION PHACO AND INTRAOCULAR LENS PLACEMENT (IOC);  Surgeon: Tonny Branch, MD;  Location: [facility];  Service: Ophthalmology;  Laterality: Left;  CDE: 8.08  . KYPHOPLASTY  Bilateral 06/09/2019   Procedure: T11 KYPHOPLASTY;  Surgeon: Consuella Lose, MD;  Location: [facility];  Service: Neurosurgery;  Laterality: Bilateral;  . None to Date  10/01/15  . PORTACATH PLACEMENT Left 10/11/2020   Procedure: INSERTION PORT-A-CATH (attached catheter in left internal jugular);  Surgeon: Virl Cagey, MD;  Location: [facility];  Service: General;  Laterality: Left;    SOCIAL HISTORY:  Social History   Socioeconomic History  . Marital status: Widowed    Spouse name: Not on file  . Number of children: Not on file  . Years of education: Not on file  . Highest education level: Not on file  Occupational History  . Not on file  Tobacco Use  . Smoking status: Former Smoker    Years: 15.00    Types: Cigarettes, Cigars    Quit date: 10/01/1990    Years since quitting: 30.0  . Smokeless tobacco: Never Used  . Tobacco comment: Quit x 25-30 years; 2 cigars daily when smoked  Vaping Use  . Vaping Use: Never used  Substance and Sexual Activity  . Alcohol use: Not Currently    Alcohol/week: 0.0 standard drinks  . Drug use: No  . Sexual activity: Never    Birth control/protection: None  Other Topics Concern  . Not on file  Social History Narrative  . Not on file   Social Determinants of Health   Financial Resource Strain: High Risk  . Difficulty of Paying Living Expenses: Hard  Food Insecurity: No Food Insecurity  . Worried About Charity fundraiser in the Last Year: Never true  . Ran Out of Food in the Last Year: Never true  Transportation Needs: No Transportation Needs  . Lack of Transportation (Medical): No  . Lack of Transportation (Non-Medical): No  Physical Activity: Inactive  . Days of Exercise per Week: 0 days  . Minutes of Exercise per Session: 0 min  Stress: No Stress Concern Present  . Feeling of Stress : Not at all  Social Connections: Moderately Integrated  . Frequency of Communication with Friends and Family: More than three times a week  .  Frequency of Social Gatherings with Friends and Family: More than three times a week  . Attends Religious Services: More than 4 times per year  . Active Member of Clubs or Organizations: Yes  . Attends Archivist Meetings: More than 4 times per year  . Marital Status: Widowed  Intimate Partner Violence: Not At Risk  . Fear of Current or Ex-Partner: No  . Emotionally Abused: No  . Physically Abused: No  . Sexually Abused: No    FAMILY HISTORY:  Family History  Problem Relation Age of Onset  . Kidney disease Mother   . Kidney disease Sister   . Colon cancer Neg Hx   . Gastric cancer Neg Hx   . Esophageal cancer Neg Hx     CURRENT MEDICATIONS:  Current Outpatient Medications  Medication Sig Dispense Refill  . acetaminophen (TYLENOL) 325 MG tablet Take 2 tablets (650 mg total) by mouth every 6 (six) hours as needed for mild pain, fever or headache (or Fever >/= 101). 30 tablet 2  . albuterol (VENTOLIN HFA) 108 (90 Base) MCG/ACT inhaler Inhale 2 puffs into the lungs every  4 (four) hours as needed for wheezing or shortness of breath. 18 g 2  . azaCITIDine 5 mg/2 mLs in lactated ringers infusion Inject 75 mg/m2 into the vein daily. Days 1-7 every 28 days    . doxycycline (VIBRA-TABS) 100 MG tablet Take 100 mg by mouth 2 (two) times daily. (Patient not taking: No sig reported)    . epoetin alfa (PROCRIT) 38182 UNIT/ML injection Inject 1 mL (10,000 Units total) into the skin once a week. (Patient not taking: Reported on 10/01/2020) 1 mL 5  . ondansetron (ZOFRAN) 4 MG tablet Take 1 tablet (4 mg total) by mouth every 6 (six) hours as needed for nausea or vomiting. 12 tablet 0  . pantoprazole (PROTONIX) 40 MG tablet Take 40 mg by mouth daily. Pt no longer taking    . prochlorperazine (COMPAZINE) 10 MG tablet Take 1 tablet (10 mg total) by mouth every 6 (six) hours as needed for nausea or vomiting. 30 tablet 3  . tamsulosin (FLOMAX) 0.4 MG CAPS capsule Take 1 capsule (0.4 mg total) by  mouth daily after supper. 30 capsule 2  . tamsulosin (FLOMAX) 0.4 MG CAPS capsule Take 1 capsule by mouth daily. (Patient not taking: No sig reported)    . tiotropium (SPIRIVA HANDIHALER) 18 MCG inhalation capsule Place 1 capsule (18 mcg total) into inhaler and inhale daily. 30 capsule 12   No current facility-administered medications for this visit.    ALLERGIES:  No Known Allergies  PHYSICAL EXAM:  Performance status (ECOG): 2 - Symptomatic, <50% confined to bed  There were no vitals filed for this visit. Wt Readings from Last 3 Encounters:  10/09/20 137 lb (62.1 kg)  10/01/20 137 lb (62.1 kg)  10/01/20 137 lb 4.8 oz (62.3 kg)   Physical Exam Constitutional:      Appearance: Normal appearance. He is cachectic.  HENT:     Head: Normocephalic and atraumatic.     Mouth/Throat:     Mouth: Mucous membranes are moist.  Eyes:     Extraocular Movements: Extraocular movements intact.     Pupils: Pupils are equal, round, and reactive to light.  Cardiovascular:     Rate and Rhythm: Normal rate and regular rhythm.     Pulses: Normal pulses.     Heart sounds: Normal heart sounds.  Pulmonary:     Effort: Pulmonary effort is normal.     Breath sounds: Normal breath sounds.  Abdominal:     General: Bowel sounds are normal.     Palpations: Abdomen is soft.     Tenderness: There is no abdominal tenderness.  Musculoskeletal:        General: No swelling.     Right lower leg: No edema.     Left lower leg: No edema.  Lymphadenopathy:     Cervical: No cervical adenopathy.  Skin:    General: Skin is warm and dry.     Comments: Left-sided chest port in place, intact, significant surrounding ecchymosis extending to left breast  Neurological:     General: No focal deficit present.     Mental Status: He is alert and oriented to person, place, and time.  Psychiatric:        Mood and Affect: Mood normal.        Behavior: Behavior normal.      LABORATORY DATA:  I have reviewed the labs  as listed.  CBC Latest Ref Rng & Units 10/09/2020 10/01/2020 09/30/2020  WBC 4.0 - 10.5 K/uL 2.0(L) 5.5 4.9  Hemoglobin  13.0 - 17.0 g/dL 7.5(L) 6.1(LL) 7.0(L)  Hematocrit 39.0 - 52.0 % 25.2(L) 20.2(L) 23.8(L)  Platelets 150 - 400 K/uL 33(L) 31(L) 32(L)   CMP Latest Ref Rng & Units 10/09/2020 10/01/2020 09/30/2020  Glucose 70 - 99 mg/dL 82 100(H) 108(H)  BUN 8 - 23 mg/dL 29(H) 34(H) 33(H)  Creatinine 0.61 - 1.24 mg/dL 1.77(H) 2.11(H) 2.13(H)  Sodium 135 - 145 mmol/L 135 136 141  Potassium 3.5 - 5.1 mmol/L 3.9 3.9 3.8  Chloride 98 - 111 mmol/L 107 104 104  CO2 22 - 32 mmol/L 18(L) 24 25  Calcium 8.9 - 10.3 mg/dL 9.0 9.0 9.8  Total Protein 6.5 - 8.1 g/dL - 6.1(L) 7.0  Total Bilirubin 0.3 - 1.2 mg/dL - 1.4(H) 1.7(H)  Alkaline Phos 38 - 126 U/L - 59 64  AST 15 - 41 U/L - 17 20  ALT 0 - 44 U/L - 12 15    DIAGNOSTIC IMAGING:  I have independently reviewed the scans and discussed with the patient. DG Chest Port 1 View  Result Date: 10/11/2020 CLINICAL DATA:  Port-A-Cath placement. EXAM: PORTABLE CHEST 1 VIEW COMPARISON:  July 27, 2020. FINDINGS: The heart size and mediastinal contours are within normal limits. Interval placement of left internal jugular Port-A-Cath with distal tip in expected position of the SVC. No pneumothorax or pleural effusion is noted. Stable scarring is seen throughout both lungs. The visualized skeletal structures are unremarkable. IMPRESSION: Interval placement of left internal jugular Port-A-Cath with distal tip in expected position of the SVC. Electronically Signed   By: Marijo Conception M.D.   On: 10/11/2020 09:55   DG C-Arm 1-60 Min-No Report  Result Date: 10/11/2020 Fluoroscopy was utilized by the requesting physician.  No radiographic interpretation.     ASSESSMENT: 1. Myelodysplastic syndrome with pancytopenia: -Bone marrow biopsy on 12/08/2016 in [place], [place] showed normocellular marrow with trilineage hematopoiesis with erythroid predominance and  dysmegakaryopoiesis.Blasts were not increased. Cytogenetics showed 46, XY,del(12)(p.2p13)[8]/46,XY[12]. FISH was positive for 12p-. -Bone marrow biopsy and testing in 2018 wasconsistent with myelodysplastic syndrome with single lineage dysplasia (MDS-SLD),IPSS-Rscore of 2, consistent with low risk category -Repeat bone marrow biopsy performed on 08/13/2019 by Dr. Delton Coombes with the following results:  Hypercellular bone marrow with dyspoietic changes  Bone marrow aspirate with 5% blastic cells of granulocytic precursors  Flow pathology with 3% blasts  Peripheral blood with 2% circulating blasts  Cytogenetic analysis show del(12p)and 13 of the examined cells with remaining 7 cells showing normal chromosome  Karyotype:46,XY,del(12)(p13p11.2)[13]/46,XY[7]  FISH analysis was normal -Based on most recent bone marrow biopsy (08/04/2020), patient's IPSS-R is5, showing that he has progressed to the high risk category -He has required multiple blood transfusions over the past 6 months, most recently on 08/09/2020 (Hgb 7.1) -No improvement on monthly Aranesp or onweekly Retacrit injections(started on 07/10/2020- discontinued 09/04/2020) -Stool occult blood cardsx3 werenegative -Azacitidine (50 mg/m2) x5 days started on 09/16/2020. - Labs today (10/21/2020) show WBC 2.8 (ANC 1.9), Hgb 7.0, platelets 78.  CMP shows improved creatinine 1.67 and back to baseline after peaking at 2.13 (09/30/2020).  LDH normal at 125.   PLAN: 1. Myelodysplastic syndrome (high risk) with pancytopenia  - First cycle of azacitidine on 09/16/2020, 50 mg/meters-squared for 5 days, will proceed with cycle 2 today (10/21/2020) - Worsening pancytopenia after first cycle of azacitidine with nadir of blood counts as follows:  WBC 2.0 (10/09/20), Hgb 6.1 (10/01/2020), platelets 31 (10/01/2020)  - He had an episode of syncope on 10/01/2020 and received PRBC x2 units -Discussed  with Dr. Lindi Adie (on-call supervising oncologist)  and the patient, and we will proceed with cycle 2 of azacitidine - Transfuse PRBC x2 due to Hgb 7.0 (10/21/2020) - Continue to monitor blood counts closely with weekly CBC - Check ferritin and iron panel in 3 months (due 12/17/2020) due to risk of transfusion related iron overload - Follow-up visit in 4 weeks before starting cycle #3, labs in 4 weeks (CBC, CMP, LDH)  2.  Malnutrition - Evidenced by poor appetite and continued gradual weight loss - BMI today (10/21/2020) extremely low at 15.95 - Start patient on Marinol 2.5 mg twice daily - Referral to dietitian  3. CKD stage 3b -His creatinine had gone up to 2.1 on 10/01/2020, but creatinine is improved today (10/20/2020) with creatinine 1.67 -  Continue to encourage hydration.   PLAN SUMMARY & DISPOSITION: - Proceed with cycle #2 of Vidaza x5 days - Transfuse PRBC x2 - Weekly CBC to monitor blood counts - Repeat CBC/CMP/LDH in 4 weeks - Follow-up visit (SHARED) in 4 weeks on first day of cycle #3 - Referral to dietitian/nutritionist  - Prescription sent for Marinol 2.5 mg twice daily  All questions were answered. The patient knows to call the clinic with any problems, questions or concerns.  Medical decision making: Moderate  Time spent on visit: I spent 25 minutes counseling the patient face to face. The total time spent in the appointment was 40 minutes and more than 50% was on counseling.   Harriett Rush, PA-C  10/20/20 6:24 PM

## 2020-10-21 ENCOUNTER — Telehealth (HOSPITAL_COMMUNITY): Payer: Self-pay | Admitting: Dietician

## 2020-10-21 ENCOUNTER — Inpatient Hospital Stay (HOSPITAL_COMMUNITY): Payer: Medicare Other | Attending: Hematology

## 2020-10-21 ENCOUNTER — Inpatient Hospital Stay (HOSPITAL_BASED_OUTPATIENT_CLINIC_OR_DEPARTMENT_OTHER): Payer: Medicare Other | Admitting: Physician Assistant

## 2020-10-21 ENCOUNTER — Inpatient Hospital Stay (HOSPITAL_COMMUNITY): Payer: Medicare Other

## 2020-10-21 ENCOUNTER — Other Ambulatory Visit: Payer: Self-pay

## 2020-10-21 VITALS — BP 107/54 | HR 75 | Temp 97.0°F | Resp 18

## 2020-10-21 VITALS — BP 97/52 | HR 88 | Temp 97.0°F | Resp 20 | Wt 131.0 lb

## 2020-10-21 DIAGNOSIS — D469 Myelodysplastic syndrome, unspecified: Secondary | ICD-10-CM | POA: Insufficient documentation

## 2020-10-21 DIAGNOSIS — D61818 Other pancytopenia: Secondary | ICD-10-CM | POA: Diagnosis not present

## 2020-10-21 DIAGNOSIS — Z87891 Personal history of nicotine dependence: Secondary | ICD-10-CM | POA: Insufficient documentation

## 2020-10-21 DIAGNOSIS — R55 Syncope and collapse: Secondary | ICD-10-CM | POA: Insufficient documentation

## 2020-10-21 DIAGNOSIS — N1832 Chronic kidney disease, stage 3b: Secondary | ICD-10-CM | POA: Diagnosis not present

## 2020-10-21 DIAGNOSIS — E46 Unspecified protein-calorie malnutrition: Secondary | ICD-10-CM

## 2020-10-21 DIAGNOSIS — Z79899 Other long term (current) drug therapy: Secondary | ICD-10-CM | POA: Diagnosis not present

## 2020-10-21 DIAGNOSIS — R531 Weakness: Secondary | ICD-10-CM | POA: Diagnosis not present

## 2020-10-21 LAB — CBC WITH DIFFERENTIAL/PLATELET
Abs Immature Granulocytes: 0.07 10*3/uL (ref 0.00–0.07)
Basophils Absolute: 0 10*3/uL (ref 0.0–0.1)
Basophils Relative: 0 %
Eosinophils Absolute: 0 10*3/uL (ref 0.0–0.5)
Eosinophils Relative: 0 %
HCT: 23.4 % — ABNORMAL LOW (ref 39.0–52.0)
Hemoglobin: 7 g/dL — ABNORMAL LOW (ref 13.0–17.0)
Immature Granulocytes: 3 %
Lymphocytes Relative: 25 %
Lymphs Abs: 0.7 10*3/uL (ref 0.7–4.0)
MCH: 24.9 pg — ABNORMAL LOW (ref 26.0–34.0)
MCHC: 29.9 g/dL — ABNORMAL LOW (ref 30.0–36.0)
MCV: 83.3 fL (ref 80.0–100.0)
Monocytes Absolute: 0.2 10*3/uL (ref 0.1–1.0)
Monocytes Relative: 5 %
Neutro Abs: 1.9 10*3/uL (ref 1.7–7.7)
Neutrophils Relative %: 67 %
Platelets: 78 10*3/uL — ABNORMAL LOW (ref 150–400)
RBC: 2.81 MIL/uL — ABNORMAL LOW (ref 4.22–5.81)
RDW: 18.9 % — ABNORMAL HIGH (ref 11.5–15.5)
WBC: 2.8 10*3/uL — ABNORMAL LOW (ref 4.0–10.5)
nRBC: 0 % (ref 0.0–0.2)

## 2020-10-21 LAB — COMPREHENSIVE METABOLIC PANEL
ALT: 11 U/L (ref 0–44)
AST: 15 U/L (ref 15–41)
Albumin: 3.4 g/dL — ABNORMAL LOW (ref 3.5–5.0)
Alkaline Phosphatase: 64 U/L (ref 38–126)
Anion gap: 6 (ref 5–15)
BUN: 31 mg/dL — ABNORMAL HIGH (ref 8–23)
CO2: 22 mmol/L (ref 22–32)
Calcium: 9.7 mg/dL (ref 8.9–10.3)
Chloride: 111 mmol/L (ref 98–111)
Creatinine, Ser: 1.67 mg/dL — ABNORMAL HIGH (ref 0.61–1.24)
GFR, Estimated: 39 mL/min — ABNORMAL LOW (ref >60)
Glucose, Bld: 95 mg/dL (ref 70–99)
Potassium: 3.6 mmol/L (ref 3.5–5.1)
Sodium: 139 mmol/L (ref 135–145)
Total Bilirubin: 1.1 mg/dL (ref 0.3–1.2)
Total Protein: 6.6 g/dL (ref 6.5–8.1)

## 2020-10-21 LAB — PREPARE RBC (CROSSMATCH)

## 2020-10-21 LAB — LACTATE DEHYDROGENASE: LDH: 125 U/L (ref 98–192)

## 2020-10-21 MED ORDER — SODIUM CHLORIDE 0.9 % IV SOLN: Freq: Once | INTRAVENOUS | Status: AC

## 2020-10-21 MED ORDER — HEPARIN SOD (PORK) LOCK FLUSH 100 UNIT/ML IV SOLN
500.0000 [IU] | Freq: Once | INTRAVENOUS | Status: AC | PRN
  Administered 2020-10-21: 500 [IU]

## 2020-10-21 MED ORDER — SODIUM CHLORIDE 0.9 % IV SOLN
50.0000 mg/m2 | Freq: Once | INTRAVENOUS | Status: AC
  Administered 2020-10-21: 90 mg via INTRAVENOUS
  Filled 2020-10-21: qty 9

## 2020-10-21 MED ORDER — DRONABINOL 2.5 MG PO CAPS: 2.5000 mg | ORAL_CAPSULE | Freq: Two times a day (BID) | ORAL | 2 refills | Status: DC

## 2020-10-21 MED ORDER — SODIUM CHLORIDE 0.9% FLUSH
10.0000 mL | INTRAVENOUS | Status: DC | PRN
  Administered 2020-10-21: 10 mL

## 2020-10-21 MED ORDER — PALONOSETRON HCL INJECTION 0.25 MG/5ML
0.2500 mg | Freq: Once | INTRAVENOUS | Status: AC
  Administered 2020-10-21: 0.25 mg via INTRAVENOUS
  Filled 2020-10-21: qty 5

## 2020-10-21 MED ORDER — SODIUM CHLORIDE 0.9 % IV SOLN
10.0000 mg | Freq: Once | INTRAVENOUS | Status: AC
  Administered 2020-10-21: 10 mg via INTRAVENOUS
  Filled 2020-10-21: qty 10

## 2020-10-21 NOTE — Progress Notes (Signed)
Patients port flushed without difficulty.  Good blood return noted with no bruising or swelling noted at site. Stable during access and blood draw.  Patient to remain accessed for possible treatment.  

## 2020-10-21 NOTE — Patient Instructions (Signed)
[facility]  Discharge Instructions: Thank you for choosing [facility] to provide your oncology and hematology care.  If you have a lab appointment with the Cancer Center, please come in thru the Main Entrance and check in at the main information desk.  Wear comfortable clothing and clothing appropriate for easy access to any Portacath or PICC line.   We strive to give you quality time with your provider. You may need to reschedule your appointment if you arrive late (15 or more minutes).  Arriving late affects you and other patients whose appointments are after yours.  Also, if you miss three or more appointments without notifying the office, you may be dismissed from the clinic at the provider's discretion.      For prescription refill requests, have your pharmacy contact our office and allow 72 hours for refills to be completed.    Today you received the following    To help prevent nausea and vomiting after your treatment, we encourage you to take your nausea medication as directed.  BELOW ARE SYMPTOMS THAT SHOULD BE REPORTED IMMEDIATELY: . *FEVER GREATER THAN 100.4 F (38 C) OR HIGHER . *CHILLS OR SWEATING . *NAUSEA AND VOMITING THAT IS NOT CONTROLLED WITH YOUR NAUSEA MEDICATION . *UNUSUAL SHORTNESS OF BREATH . *UNUSUAL BRUISING OR BLEEDING . *URINARY PROBLEMS (pain or burning when urinating, or frequent urination) . *BOWEL PROBLEMS (unusual diarrhea, constipation, pain near the anus) . TENDERNESS IN MOUTH AND THROAT WITH OR WITHOUT PRESENCE OF ULCERS (sore throat, sores in mouth, or a toothache) . UNUSUAL RASH, SWELLING OR PAIN  . UNUSUAL VAGINAL DISCHARGE OR ITCHING   Items with * indicate a potential emergency and should be followed up as soon as possible or go to the Emergency Department if any problems should occur.  Please show the CHEMOTHERAPY ALERT CARD or IMMUNOTHERAPY ALERT CARD at check-in to the Emergency Department and triage nurse.  Should  you have questions after your visit or need to cancel or reschedule your appointment, please contact  CANCER CENTER [phone]  and follow the prompts.  Office hours are 8:00 a.m. to 4:30 p.m. Monday - Friday. Please note that voicemails left after 4:00 p.m. may not be returned until the following business day.  We are closed weekends and major holidays. You have access to a nurse at all times for urgent questions. Please call the main number to the clinic [phone] and follow the prompts.  For any non-urgent questions, you may also contact your provider using MyChart. We now offer e-Visits for anyone 18 and older to request care online for non-urgent symptoms. For details visit [email or web address].   Also download the MyChart app! Go to the app store, search "MyChart", open the app, select [facility], and log in with your MyChart username and password.  Due to Covid, a mask is required upon entering the hospital/clinic. If you do not have a mask, one will be given to you upon arrival. For doctor visits, patients may have 1 support person aged 18 or older with them. For treatment visits, patients cannot have anyone with them due to current Covid guidelines and our immunocompromised population.  

## 2020-10-21 NOTE — Progress Notes (Signed)
Labs reviewed with R. Pennington PA-C and Dr. Lindi Adie today. Will treat and give 2 units of blood tomorrow with treatment as well. Proceed today with day one of treatment per MD.  Treatment given per orders. Patient tolerated it well without problems. Vitals stable and discharged home from clinic via wheelchair. Follow up as scheduled.

## 2020-10-21 NOTE — Telephone Encounter (Signed)
Nutrition  Attempted to contact patient via telephone per PA provider request secondary to reduced appetite and weight loss. Patient did not answer. Message left on voicemail with request for return call as well as information regarding upcoming nutrition appointment scheduled for 6/2. Contact information provided.

## 2020-10-21 NOTE — Patient Instructions (Signed)
[place] at [facility] Discharge Instructions  You were seen today by Tarri Abernethy PA-C for your myelodysplastic syndrome (MDS).    We will proceed with cycle #2 of azacitidine (Vidaza) starting today x5 days.  We will transfuse you with 2 units of blood at tomorrow's appointment (10/22/2020).  We will check weekly blood tests to see if you need further transfusions.  Follow-up appointment in 4 weeks before starting cycle #3.  Referral has also been sent to dietitian due to poor appetite and weight loss.  Prescription sent to start taking Marinol 2.5 mg twice daily to help stimulate your appetite. ------------------------------------------------------------------------------------------------------------------------  Thank you for choosing [place] at [facility] to provide your oncology and hematology care.  To afford each patient quality time with our provider, please arrive at least 15 minutes before your scheduled appointment time.   If you have a lab appointment with the [facility] please come in thru the Main Entrance and check in at the main information desk.  You need to re-schedule your appointment should you arrive 10 or more minutes late.  We strive to give you quality time with our providers, and arriving late affects you and other patients whose appointments are after yours.  Also, if you no show three or more times for appointments you may be dismissed from the clinic at the providers discretion.     Again, thank you for choosing [facility].  Our hope is that these requests will decrease the amount of time that you wait before being seen by our physicians.       _____________________________________________________________  Should you have questions after your visit to [facility], please contact our office at [phone] and follow the prompts.  Our office hours are 8:00 a.m. and 4:30 p.m.  Monday - Friday.  Please note that voicemails left after 4:00 p.m. may not be returned until the following business day.  We are closed weekends and major holidays.  You do have access to a nurse 24-7, just call the main number to the clinic [phone] and do not press any options, hold on the line and a nurse will answer the phone.    For prescription refill requests, have your pharmacy contact our office and allow 72 hours.    Due to Covid, you will need to wear a mask upon entering the hospital. If you do not have a mask, a mask will be given to you at the Main Entrance upon arrival. For doctor visits, patients may have 1 support person age 71 or older with them. For treatment visits, patients can not have anyone with them due to social distancing guidelines and our immunocompromised population.

## 2020-10-22 ENCOUNTER — Inpatient Hospital Stay (HOSPITAL_COMMUNITY): Payer: Medicare Other

## 2020-10-22 VITALS — BP 123/71 | HR 67 | Temp 96.7°F | Resp 17

## 2020-10-22 DIAGNOSIS — N1832 Chronic kidney disease, stage 3b: Secondary | ICD-10-CM | POA: Diagnosis not present

## 2020-10-22 DIAGNOSIS — R55 Syncope and collapse: Secondary | ICD-10-CM | POA: Diagnosis not present

## 2020-10-22 DIAGNOSIS — D469 Myelodysplastic syndrome, unspecified: Secondary | ICD-10-CM | POA: Diagnosis not present

## 2020-10-22 DIAGNOSIS — R531 Weakness: Secondary | ICD-10-CM | POA: Diagnosis not present

## 2020-10-22 DIAGNOSIS — Z87891 Personal history of nicotine dependence: Secondary | ICD-10-CM | POA: Diagnosis not present

## 2020-10-22 DIAGNOSIS — D649 Anemia, unspecified: Secondary | ICD-10-CM

## 2020-10-22 DIAGNOSIS — D61818 Other pancytopenia: Secondary | ICD-10-CM | POA: Diagnosis not present

## 2020-10-22 MED ORDER — SODIUM CHLORIDE 0.9% FLUSH: 10.0000 mL | INTRAVENOUS | Status: DC | PRN

## 2020-10-22 MED ORDER — SODIUM CHLORIDE 0.9 % IV SOLN: Freq: Once | INTRAVENOUS | Status: AC

## 2020-10-22 MED ORDER — HEPARIN SOD (PORK) LOCK FLUSH 100 UNIT/ML IV SOLN
500.0000 [IU] | Freq: Once | INTRAVENOUS | Status: AC | PRN
  Administered 2020-10-22: 500 [IU]

## 2020-10-22 MED ORDER — SODIUM CHLORIDE 0.9 % IV SOLN
50.0000 mg/m2 | Freq: Once | INTRAVENOUS | Status: AC
  Administered 2020-10-22: 90 mg via INTRAVENOUS
  Filled 2020-10-22: qty 9

## 2020-10-22 MED ORDER — SODIUM CHLORIDE 0.9% IV SOLUTION
250.0000 mL | Freq: Once | INTRAVENOUS | Status: AC
Start: 2020-10-22 — End: 2020-10-22
  Administered 2020-10-22: 250 mL via INTRAVENOUS

## 2020-10-22 MED ORDER — SODIUM CHLORIDE 0.9% FLUSH
10.0000 mL | INTRAVENOUS | Status: AC | PRN
Start: 2020-10-22 — End: 2020-10-22
  Administered 2020-10-22: 10 mL

## 2020-10-22 MED ORDER — ACETAMINOPHEN 325 MG PO TABS
650.0000 mg | ORAL_TABLET | Freq: Once | ORAL | Status: AC
  Administered 2020-10-22: 650 mg via ORAL
  Filled 2020-10-22: qty 2

## 2020-10-22 MED ORDER — DIPHENHYDRAMINE HCL 25 MG PO CAPS
25.0000 mg | ORAL_CAPSULE | Freq: Once | ORAL | Status: AC
  Administered 2020-10-22: 25 mg via ORAL
  Filled 2020-10-22: qty 1

## 2020-10-22 MED ORDER — HEPARIN SOD (PORK) LOCK FLUSH 100 UNIT/ML IV SOLN
500.0000 [IU] | Freq: Every day | INTRAVENOUS | Status: AC | PRN
Start: 2020-10-22 — End: 2020-10-22

## 2020-10-22 MED ORDER — SODIUM CHLORIDE 0.9 % IV SOLN
10.0000 mg | Freq: Once | INTRAVENOUS | Status: AC
  Administered 2020-10-22: 10 mg via INTRAVENOUS
  Filled 2020-10-22: qty 10

## 2020-10-22 NOTE — Patient Instructions (Signed)
[facility]  Discharge Instructions: Thank you for choosing [facility] to provide your oncology and hematology care.  If you have a lab appointment with the Cancer Center, please come in thru the Main Entrance and check in at the main information desk.  Wear comfortable clothing and clothing appropriate for easy access to any Portacath or PICC line.   We strive to give you quality time with your provider. You may need to reschedule your appointment if you arrive late (15 or more minutes).  Arriving late affects you and other patients whose appointments are after yours.  Also, if you miss three or more appointments without notifying the office, you may be dismissed from the clinic at the provider's discretion.      For prescription refill requests, have your pharmacy contact our office and allow 72 hours for refills to be completed.        To help prevent nausea and vomiting after your treatment, we encourage you to take your nausea medication as directed.  BELOW ARE SYMPTOMS THAT SHOULD BE REPORTED IMMEDIATELY: *FEVER GREATER THAN 100.4 F (38 C) OR HIGHER *CHILLS OR SWEATING *NAUSEA AND VOMITING THAT IS NOT CONTROLLED WITH YOUR NAUSEA MEDICATION *UNUSUAL SHORTNESS OF BREATH *UNUSUAL BRUISING OR BLEEDING *URINARY PROBLEMS (pain or burning when urinating, or frequent urination) *BOWEL PROBLEMS (unusual diarrhea, constipation, pain near the anus) TENDERNESS IN MOUTH AND THROAT WITH OR WITHOUT PRESENCE OF ULCERS (sore throat, sores in mouth, or a toothache) UNUSUAL RASH, SWELLING OR PAIN  UNUSUAL VAGINAL DISCHARGE OR ITCHING   Items with * indicate a potential emergency and should be followed up as soon as possible or go to the Emergency Department if any problems should occur.  Please show the CHEMOTHERAPY ALERT CARD or IMMUNOTHERAPY ALERT CARD at check-in to the Emergency Department and triage nurse.  Should you have questions after your visit or need to cancel  or reschedule your appointment, please contact [facility] [phone]  and follow the prompts.  Office hours are 8:00 a.m. to 4:30 p.m. Monday - Friday. Please note that voicemails left after 4:00 p.m. may not be returned until the following business day.  We are closed weekends and major holidays. You have access to a nurse at all times for urgent questions. Please call the main number to the clinic [phone] and follow the prompts.  For any non-urgent questions, you may also contact your provider using MyChart. We now offer e-Visits for anyone 18 and older to request care online for non-urgent symptoms. For details visit [email or web address].   Also download the MyChart app! Go to the app store, search "MyChart", open the app, select , and log in with your MyChart username and password.  Due to Covid, a mask is required upon entering the hospital/clinic. If you do not have a mask, one will be given to you upon arrival. For doctor visits, patients may have 1 support person aged 18 or older with them. For treatment visits, patients cannot have anyone with them due to current Covid guidelines and our immunocompromised population.  

## 2020-10-22 NOTE — Progress Notes (Signed)
Treatment given per orders. Two units of blood given today as well per MD orders. Patient tolerated it well without problems. Vitals stable and discharged home from clinic via pt's personal motorized scooter.  Follow up as scheduled.

## 2020-10-23 ENCOUNTER — Inpatient Hospital Stay (HOSPITAL_COMMUNITY): Payer: Medicare Other

## 2020-10-23 ENCOUNTER — Other Ambulatory Visit: Payer: Self-pay

## 2020-10-23 VITALS — BP 102/56 | HR 54 | Temp 96.8°F | Resp 18 | Wt 133.8 lb

## 2020-10-23 DIAGNOSIS — D469 Myelodysplastic syndrome, unspecified: Secondary | ICD-10-CM | POA: Diagnosis not present

## 2020-10-23 DIAGNOSIS — N1832 Chronic kidney disease, stage 3b: Secondary | ICD-10-CM | POA: Diagnosis not present

## 2020-10-23 DIAGNOSIS — R55 Syncope and collapse: Secondary | ICD-10-CM | POA: Diagnosis not present

## 2020-10-23 DIAGNOSIS — D61818 Other pancytopenia: Secondary | ICD-10-CM | POA: Diagnosis not present

## 2020-10-23 DIAGNOSIS — R531 Weakness: Secondary | ICD-10-CM | POA: Diagnosis not present

## 2020-10-23 DIAGNOSIS — Z87891 Personal history of nicotine dependence: Secondary | ICD-10-CM | POA: Diagnosis not present

## 2020-10-23 LAB — BPAM RBC
Blood Product Expiration Date: 202206122359
Blood Product Expiration Date: 202207042359
ISSUE DATE / TIME: 202206070946
ISSUE DATE / TIME: 202206071140
Unit Type and Rh: 1700
Unit Type and Rh: 9500

## 2020-10-23 LAB — TYPE AND SCREEN
ABO/RH(D): B NEG
Antibody Screen: NEGATIVE
Unit division: 0
Unit division: 0

## 2020-10-23 MED ORDER — SODIUM CHLORIDE 0.9 % IV SOLN: Freq: Once | INTRAVENOUS | Status: AC

## 2020-10-23 MED ORDER — HEPARIN SOD (PORK) LOCK FLUSH 100 UNIT/ML IV SOLN
500.0000 [IU] | Freq: Once | INTRAVENOUS | Status: AC | PRN
  Administered 2020-10-23: 500 [IU]

## 2020-10-23 MED ORDER — SODIUM CHLORIDE 0.9 % IV SOLN
10.0000 mg | Freq: Once | INTRAVENOUS | Status: AC
  Administered 2020-10-23: 10 mg via INTRAVENOUS
  Filled 2020-10-23: qty 1

## 2020-10-23 MED ORDER — PALONOSETRON HCL INJECTION 0.25 MG/5ML
0.2500 mg | Freq: Once | INTRAVENOUS | Status: AC
  Administered 2020-10-23: 0.25 mg via INTRAVENOUS
  Filled 2020-10-23: qty 5

## 2020-10-23 MED ORDER — SODIUM CHLORIDE 0.9% FLUSH
10.0000 mL | INTRAVENOUS | Status: DC | PRN
Start: 2020-10-23 — End: 2020-10-23
  Administered 2020-10-23: 10 mL

## 2020-10-23 MED ORDER — SODIUM CHLORIDE 0.9 % IV SOLN
50.0000 mg/m2 | Freq: Once | INTRAVENOUS | Status: AC
  Administered 2020-10-23: 90 mg via INTRAVENOUS
  Filled 2020-10-23: qty 9

## 2020-10-23 NOTE — Progress Notes (Signed)
Treatment given per orders. Patient tolerated it well without problems. Vitals stable and discharged home from clinic via person motorized scooter.  Follow up as scheduled.

## 2020-10-23 NOTE — Patient Instructions (Signed)
[facility]  Discharge Instructions: Thank you for choosing [facility] to provide your oncology and hematology care.  If you have a lab appointment with the Cancer Center, please come in thru the Main Entrance and check in at the main information desk.  Wear comfortable clothing and clothing appropriate for easy access to any Portacath or PICC line.   We strive to give you quality time with your provider. You may need to reschedule your appointment if you arrive late (15 or more minutes).  Arriving late affects you and other patients whose appointments are after yours.  Also, if you miss three or more appointments without notifying the office, you may be dismissed from the clinic at the provider's discretion.      For prescription refill requests, have your pharmacy contact our office and allow 72 hours for refills to be completed.    Today you received the following chemotherapy and/or immunotherapy agents    To help prevent nausea and vomiting after your treatment, we encourage you to take your nausea medication as directed.  BELOW ARE SYMPTOMS THAT SHOULD BE REPORTED IMMEDIATELY: . *FEVER GREATER THAN 100.4 F (38 C) OR HIGHER . *CHILLS OR SWEATING . *NAUSEA AND VOMITING THAT IS NOT CONTROLLED WITH YOUR NAUSEA MEDICATION . *UNUSUAL SHORTNESS OF BREATH . *UNUSUAL BRUISING OR BLEEDING . *URINARY PROBLEMS (pain or burning when urinating, or frequent urination) . *BOWEL PROBLEMS (unusual diarrhea, constipation, pain near the anus) . TENDERNESS IN MOUTH AND THROAT WITH OR WITHOUT PRESENCE OF ULCERS (sore throat, sores in mouth, or a toothache) . UNUSUAL RASH, SWELLING OR PAIN  . UNUSUAL VAGINAL DISCHARGE OR ITCHING   Items with * indicate a potential emergency and should be followed up as soon as possible or go to the Emergency Department if any problems should occur.  Please show the CHEMOTHERAPY ALERT CARD or IMMUNOTHERAPY ALERT CARD at check-in to the  Emergency Department and triage nurse.  Should you have questions after your visit or need to cancel or reschedule your appointment, please contact [facility] [phone]  and follow the prompts.  Office hours are 8:00 a.m. to 4:30 p.m. Monday - Friday. Please note that voicemails left after 4:00 p.m. may not be returned until the following business day.  We are closed weekends and major holidays. You have access to a nurse at all times for urgent questions. Please call the main number to the clinic [phone] and follow the prompts.  For any non-urgent questions, you may also contact your provider using MyChart. We now offer e-Visits for anyone 18 and older to request care online for non-urgent symptoms. For details visit [email or web address].   Also download the MyChart app! Go to the app store, search "MyChart", open the app, select , and log in with your MyChart username and password.  Due to Covid, a mask is required upon entering the hospital/clinic. If you do not have a mask, one will be given to you upon arrival. For doctor visits, patients may have 1 support person aged 18 or older with them. For treatment visits, patients cannot have anyone with them due to current Covid guidelines and our immunocompromised population.  

## 2020-10-24 ENCOUNTER — Inpatient Hospital Stay (HOSPITAL_COMMUNITY): Payer: Medicare Other

## 2020-10-24 VITALS — BP 107/59 | HR 55 | Temp 97.0°F | Resp 18

## 2020-10-24 DIAGNOSIS — D61818 Other pancytopenia: Secondary | ICD-10-CM | POA: Diagnosis not present

## 2020-10-24 DIAGNOSIS — R55 Syncope and collapse: Secondary | ICD-10-CM | POA: Diagnosis not present

## 2020-10-24 DIAGNOSIS — D469 Myelodysplastic syndrome, unspecified: Secondary | ICD-10-CM | POA: Diagnosis not present

## 2020-10-24 DIAGNOSIS — R531 Weakness: Secondary | ICD-10-CM | POA: Diagnosis not present

## 2020-10-24 DIAGNOSIS — Z87891 Personal history of nicotine dependence: Secondary | ICD-10-CM | POA: Diagnosis not present

## 2020-10-24 DIAGNOSIS — N1832 Chronic kidney disease, stage 3b: Secondary | ICD-10-CM | POA: Diagnosis not present

## 2020-10-24 MED ORDER — HEPARIN SOD (PORK) LOCK FLUSH 100 UNIT/ML IV SOLN
500.0000 [IU] | Freq: Once | INTRAVENOUS | Status: AC | PRN
Start: 2020-10-24 — End: 2020-10-24
  Administered 2020-10-24: 500 [IU]

## 2020-10-24 MED ORDER — SODIUM CHLORIDE 0.9 % IV SOLN
10.0000 mg | Freq: Once | INTRAVENOUS | Status: AC
  Administered 2020-10-24: 10 mg via INTRAVENOUS
  Filled 2020-10-24: qty 10

## 2020-10-24 MED ORDER — AZACITIDINE CHEMO INJECTION 100 MG
50.0000 mg/m2 | Freq: Once | INTRAMUSCULAR | Status: AC
  Administered 2020-10-24: 90 mg via INTRAVENOUS
  Filled 2020-10-24: qty 9

## 2020-10-24 MED ORDER — SODIUM CHLORIDE 0.9 % IV SOLN: Freq: Once | INTRAVENOUS | Status: AC

## 2020-10-24 MED ORDER — SODIUM CHLORIDE 0.9% FLUSH
10.0000 mL | INTRAVENOUS | Status: DC | PRN
  Administered 2020-10-24: 10 mL

## 2020-10-24 NOTE — Patient Instructions (Signed)
[facility]  Discharge Instructions: Thank you for choosing [facility] to provide your oncology and hematology care.  If you have a lab appointment with the Cancer Center, please come in thru the Main Entrance and check in at the main information desk.  Wear comfortable clothing and clothing appropriate for easy access to any Portacath or PICC line.   We strive to give you quality time with your provider. You may need to reschedule your appointment if you arrive late (15 or more minutes).  Arriving late affects you and other patients whose appointments are after yours.  Also, if you miss three or more appointments without notifying the office, you may be dismissed from the clinic at the provider's discretion.      For prescription refill requests, have your pharmacy contact our office and allow 72 hours for refills to be completed.    Today you received the following chemotherapy and/or immunotherapy agents    To help prevent nausea and vomiting after your treatment, we encourage you to take your nausea medication as directed.  BELOW ARE SYMPTOMS THAT SHOULD BE REPORTED IMMEDIATELY: . *FEVER GREATER THAN 100.4 F (38 C) OR HIGHER . *CHILLS OR SWEATING . *NAUSEA AND VOMITING THAT IS NOT CONTROLLED WITH YOUR NAUSEA MEDICATION . *UNUSUAL SHORTNESS OF BREATH . *UNUSUAL BRUISING OR BLEEDING . *URINARY PROBLEMS (pain or burning when urinating, or frequent urination) . *BOWEL PROBLEMS (unusual diarrhea, constipation, pain near the anus) . TENDERNESS IN MOUTH AND THROAT WITH OR WITHOUT PRESENCE OF ULCERS (sore throat, sores in mouth, or a toothache) . UNUSUAL RASH, SWELLING OR PAIN  . UNUSUAL VAGINAL DISCHARGE OR ITCHING   Items with * indicate a potential emergency and should be followed up as soon as possible or go to the Emergency Department if any problems should occur.  Please show the CHEMOTHERAPY ALERT CARD or IMMUNOTHERAPY ALERT CARD at check-in to the  Emergency Department and triage nurse.  Should you have questions after your visit or need to cancel or reschedule your appointment, please contact [facility] [phone]  and follow the prompts.  Office hours are 8:00 a.m. to 4:30 p.m. Monday - Friday. Please note that voicemails left after 4:00 p.m. may not be returned until the following business day.  We are closed weekends and major holidays. You have access to a nurse at all times for urgent questions. Please call the main number to the clinic [phone] and follow the prompts.  For any non-urgent questions, you may also contact your provider using MyChart. We now offer e-Visits for anyone 18 and older to request care online for non-urgent symptoms. For details visit [email or web address].   Also download the MyChart app! Go to the app store, search "MyChart", open the app, select [facility], and log in with your MyChart username and password.  Due to Covid, a mask is required upon entering the hospital/clinic. If you do not have a mask, one will be given to you upon arrival. For doctor visits, patients may have 1 support person aged 18 or older with them. For treatment visits, patients cannot have anyone with them due to current Covid guidelines and our immunocompromised population.  

## 2020-10-24 NOTE — Progress Notes (Signed)
Treatment given per orders. Patient tolerated it well without problems. Vitals stable and discharged home from clinic via personal motorized scooter.  Follow up as scheduled.

## 2020-10-25 ENCOUNTER — Other Ambulatory Visit: Payer: Self-pay

## 2020-10-25 ENCOUNTER — Inpatient Hospital Stay (HOSPITAL_COMMUNITY): Payer: Medicare Other

## 2020-10-25 VITALS — BP 129/65 | HR 58 | Temp 97.2°F | Resp 18

## 2020-10-25 DIAGNOSIS — Z87891 Personal history of nicotine dependence: Secondary | ICD-10-CM | POA: Diagnosis not present

## 2020-10-25 DIAGNOSIS — D469 Myelodysplastic syndrome, unspecified: Secondary | ICD-10-CM | POA: Diagnosis not present

## 2020-10-25 DIAGNOSIS — N1832 Chronic kidney disease, stage 3b: Secondary | ICD-10-CM | POA: Diagnosis not present

## 2020-10-25 DIAGNOSIS — R55 Syncope and collapse: Secondary | ICD-10-CM | POA: Diagnosis not present

## 2020-10-25 DIAGNOSIS — D61818 Other pancytopenia: Secondary | ICD-10-CM | POA: Diagnosis not present

## 2020-10-25 DIAGNOSIS — R531 Weakness: Secondary | ICD-10-CM | POA: Diagnosis not present

## 2020-10-25 MED ORDER — SODIUM CHLORIDE 0.9 % IV SOLN
50.0000 mg/m2 | Freq: Once | INTRAVENOUS | Status: AC
  Administered 2020-10-25: 90 mg via INTRAVENOUS
  Filled 2020-10-25: qty 9

## 2020-10-25 MED ORDER — PALONOSETRON HCL INJECTION 0.25 MG/5ML
0.2500 mg | Freq: Once | INTRAVENOUS | Status: AC
  Administered 2020-10-25: 0.25 mg via INTRAVENOUS
  Filled 2020-10-25: qty 5

## 2020-10-25 MED ORDER — HEPARIN SOD (PORK) LOCK FLUSH 100 UNIT/ML IV SOLN
500.0000 [IU] | Freq: Once | INTRAVENOUS | Status: AC | PRN
  Administered 2020-10-25: 500 [IU]

## 2020-10-25 MED ORDER — SODIUM CHLORIDE 0.9 % IV SOLN
10.0000 mg | Freq: Once | INTRAVENOUS | Status: AC
  Administered 2020-10-25: 10 mg via INTRAVENOUS
  Filled 2020-10-25: qty 10

## 2020-10-25 MED ORDER — SODIUM CHLORIDE 0.9 % IV SOLN: Freq: Once | INTRAVENOUS | Status: AC

## 2020-10-25 MED ORDER — SODIUM CHLORIDE 0.9% FLUSH
10.0000 mL | INTRAVENOUS | Status: DC | PRN
  Administered 2020-10-25: 10 mL

## 2020-10-25 NOTE — Progress Notes (Signed)
Patient presents today for day 5 Vidaza infusion per MD order.  Vital signs within parameters for treatment.  Patient has no new complaints since last visit.

## 2020-10-25 NOTE — Patient Instructions (Signed)
[facility]  Discharge Instructions: Thank you for choosing [facility] to provide your oncology and hematology care.  If you have a lab appointment with the Cancer Center, please come in thru the Main Entrance and check in at the main information desk.  Wear comfortable clothing and clothing appropriate for easy access to any Portacath or PICC line.   We strive to give you quality time with your provider. You may need to reschedule your appointment if you arrive late (15 or more minutes).  Arriving late affects you and other patients whose appointments are after yours.  Also, if you miss three or more appointments without notifying the office, you may be dismissed from the clinic at the provider's discretion.      For prescription refill requests, have your pharmacy contact our office and allow 72 hours for refills to be completed.    Today you received the following chemotherapy and/or immunotherapy agents    To help prevent nausea and vomiting after your treatment, we encourage you to take your nausea medication as directed.  BELOW ARE SYMPTOMS THAT SHOULD BE REPORTED IMMEDIATELY: . *FEVER GREATER THAN 100.4 F (38 C) OR HIGHER . *CHILLS OR SWEATING . *NAUSEA AND VOMITING THAT IS NOT CONTROLLED WITH YOUR NAUSEA MEDICATION . *UNUSUAL SHORTNESS OF BREATH . *UNUSUAL BRUISING OR BLEEDING . *URINARY PROBLEMS (pain or burning when urinating, or frequent urination) . *BOWEL PROBLEMS (unusual diarrhea, constipation, pain near the anus) . TENDERNESS IN MOUTH AND THROAT WITH OR WITHOUT PRESENCE OF ULCERS (sore throat, sores in mouth, or a toothache) . UNUSUAL RASH, SWELLING OR PAIN  . UNUSUAL VAGINAL DISCHARGE OR ITCHING   Items with * indicate a potential emergency and should be followed up as soon as possible or go to the Emergency Department if any problems should occur.  Please show the CHEMOTHERAPY ALERT CARD or IMMUNOTHERAPY ALERT CARD at check-in to the  Emergency Department and triage nurse.  Should you have questions after your visit or need to cancel or reschedule your appointment, please contact [facility] [phone]  and follow the prompts.  Office hours are 8:00 a.m. to 4:30 p.m. Monday - Friday. Please note that voicemails left after 4:00 p.m. may not be returned until the following business day.  We are closed weekends and major holidays. You have access to a nurse at all times for urgent questions. Please call the main number to the clinic [phone] and follow the prompts.  For any non-urgent questions, you may also contact your provider using MyChart. We now offer e-Visits for anyone 18 and older to request care online for non-urgent symptoms. For details visit [email or web address].   Also download the MyChart app! Go to the app store, search "MyChart", open the app, select [facility], and log in with your MyChart username and password.  Due to Covid, a mask is required upon entering the hospital/clinic. If you do not have a mask, one will be given to you upon arrival. For doctor visits, patients may have 1 support person aged 18 or older with them. For treatment visits, patients cannot have anyone with them due to current Covid guidelines and our immunocompromised population.  

## 2020-10-28 ENCOUNTER — Inpatient Hospital Stay (HOSPITAL_COMMUNITY): Payer: Medicare Other

## 2020-10-28 ENCOUNTER — Other Ambulatory Visit (HOSPITAL_COMMUNITY): Payer: Medicare Other

## 2020-10-28 ENCOUNTER — Ambulatory Visit (HOSPITAL_COMMUNITY): Payer: Medicare Other

## 2020-10-28 ENCOUNTER — Other Ambulatory Visit: Payer: Self-pay

## 2020-10-28 DIAGNOSIS — Z87891 Personal history of nicotine dependence: Secondary | ICD-10-CM | POA: Diagnosis not present

## 2020-10-28 DIAGNOSIS — D469 Myelodysplastic syndrome, unspecified: Secondary | ICD-10-CM

## 2020-10-28 DIAGNOSIS — R55 Syncope and collapse: Secondary | ICD-10-CM | POA: Diagnosis not present

## 2020-10-28 DIAGNOSIS — D61818 Other pancytopenia: Secondary | ICD-10-CM | POA: Diagnosis not present

## 2020-10-28 DIAGNOSIS — R531 Weakness: Secondary | ICD-10-CM | POA: Diagnosis not present

## 2020-10-28 DIAGNOSIS — N1832 Chronic kidney disease, stage 3b: Secondary | ICD-10-CM | POA: Diagnosis not present

## 2020-10-28 LAB — COMPREHENSIVE METABOLIC PANEL
ALT: 12 U/L (ref 0–44)
AST: 13 U/L — ABNORMAL LOW (ref 15–41)
Albumin: 3.8 g/dL (ref 3.5–5.0)
Alkaline Phosphatase: 60 U/L (ref 38–126)
Anion gap: 8 (ref 5–15)
BUN: 40 mg/dL — ABNORMAL HIGH (ref 8–23)
CO2: 26 mmol/L (ref 22–32)
Calcium: 9.8 mg/dL (ref 8.9–10.3)
Chloride: 102 mmol/L (ref 98–111)
Creatinine, Ser: 1.71 mg/dL — ABNORMAL HIGH (ref 0.61–1.24)
GFR, Estimated: 38 mL/min — ABNORMAL LOW (ref >60)
Glucose, Bld: 115 mg/dL — ABNORMAL HIGH (ref 70–99)
Potassium: 4 mmol/L (ref 3.5–5.1)
Sodium: 136 mmol/L (ref 135–145)
Total Bilirubin: 1.3 mg/dL — ABNORMAL HIGH (ref 0.3–1.2)
Total Protein: 6.8 g/dL (ref 6.5–8.1)

## 2020-10-28 LAB — CBC WITH DIFFERENTIAL/PLATELET
Abs Immature Granulocytes: 0.12 10*3/uL — ABNORMAL HIGH (ref 0.00–0.07)
Basophils Absolute: 0 10*3/uL (ref 0.0–0.1)
Basophils Relative: 0 %
Eosinophils Absolute: 0 10*3/uL (ref 0.0–0.5)
Eosinophils Relative: 0 %
HCT: 34.5 % — ABNORMAL LOW (ref 39.0–52.0)
Hemoglobin: 10.6 g/dL — ABNORMAL LOW (ref 13.0–17.0)
Immature Granulocytes: 2 %
Lymphocytes Relative: 10 %
Lymphs Abs: 0.7 10*3/uL (ref 0.7–4.0)
MCH: 26.6 pg (ref 26.0–34.0)
MCHC: 30.7 g/dL (ref 30.0–36.0)
MCV: 86.5 fL (ref 80.0–100.0)
Monocytes Absolute: 0.3 10*3/uL (ref 0.1–1.0)
Monocytes Relative: 5 %
Neutro Abs: 6.2 10*3/uL (ref 1.7–7.7)
Neutrophils Relative %: 83 %
Platelets: 72 10*3/uL — ABNORMAL LOW (ref 150–400)
RBC: 3.99 MIL/uL — ABNORMAL LOW (ref 4.22–5.81)
RDW: 18.9 % — ABNORMAL HIGH (ref 11.5–15.5)
WBC: 7.4 10*3/uL (ref 4.0–10.5)
nRBC: 0.4 % — ABNORMAL HIGH (ref 0.0–0.2)

## 2020-10-28 LAB — SAMPLE TO BLOOD BANK

## 2020-10-28 LAB — LACTATE DEHYDROGENASE: LDH: 147 U/L (ref 98–192)

## 2020-10-29 ENCOUNTER — Ambulatory Visit (HOSPITAL_COMMUNITY): Payer: Medicare Other

## 2020-11-04 ENCOUNTER — Inpatient Hospital Stay (HOSPITAL_COMMUNITY): Payer: Medicare Other | Admitting: Dietician

## 2020-11-04 ENCOUNTER — Other Ambulatory Visit: Payer: Self-pay

## 2020-11-04 ENCOUNTER — Inpatient Hospital Stay (HOSPITAL_COMMUNITY): Payer: Medicare Other

## 2020-11-04 ENCOUNTER — Other Ambulatory Visit (HOSPITAL_COMMUNITY): Payer: Medicare Other

## 2020-11-04 DIAGNOSIS — R55 Syncope and collapse: Secondary | ICD-10-CM | POA: Diagnosis not present

## 2020-11-04 DIAGNOSIS — R531 Weakness: Secondary | ICD-10-CM | POA: Diagnosis not present

## 2020-11-04 DIAGNOSIS — N1832 Chronic kidney disease, stage 3b: Secondary | ICD-10-CM | POA: Diagnosis not present

## 2020-11-04 DIAGNOSIS — D469 Myelodysplastic syndrome, unspecified: Secondary | ICD-10-CM

## 2020-11-04 DIAGNOSIS — D61818 Other pancytopenia: Secondary | ICD-10-CM | POA: Diagnosis not present

## 2020-11-04 DIAGNOSIS — Z87891 Personal history of nicotine dependence: Secondary | ICD-10-CM | POA: Diagnosis not present

## 2020-11-04 LAB — CBC WITH DIFFERENTIAL/PLATELET
Abs Immature Granulocytes: 0.4 10*3/uL — ABNORMAL HIGH (ref 0.00–0.07)
Basophils Absolute: 0 10*3/uL (ref 0.0–0.1)
Basophils Relative: 0 %
Eosinophils Absolute: 0 10*3/uL (ref 0.0–0.5)
Eosinophils Relative: 0 %
HCT: 32.5 % — ABNORMAL LOW (ref 39.0–52.0)
Hemoglobin: 9.9 g/dL — ABNORMAL LOW (ref 13.0–17.0)
Immature Granulocytes: 5 %
Lymphocytes Relative: 11 %
Lymphs Abs: 0.9 10*3/uL (ref 0.7–4.0)
MCH: 26.6 pg (ref 26.0–34.0)
MCHC: 30.5 g/dL (ref 30.0–36.0)
MCV: 87.4 fL (ref 80.0–100.0)
Monocytes Absolute: 0.4 10*3/uL (ref 0.1–1.0)
Monocytes Relative: 6 %
Neutro Abs: 5.9 10*3/uL (ref 1.7–7.7)
Neutrophils Relative %: 78 %
Platelets: 39 10*3/uL — ABNORMAL LOW (ref 150–400)
RBC: 3.72 MIL/uL — ABNORMAL LOW (ref 4.22–5.81)
RDW: 19 % — ABNORMAL HIGH (ref 11.5–15.5)
WBC: 7.6 10*3/uL (ref 4.0–10.5)
nRBC: 0 % (ref 0.0–0.2)

## 2020-11-04 LAB — COMPREHENSIVE METABOLIC PANEL
ALT: 16 U/L (ref 0–44)
AST: 20 U/L (ref 15–41)
Albumin: 3.7 g/dL (ref 3.5–5.0)
Alkaline Phosphatase: 65 U/L (ref 38–126)
Anion gap: 8 (ref 5–15)
BUN: 26 mg/dL — ABNORMAL HIGH (ref 8–23)
CO2: 24 mmol/L (ref 22–32)
Calcium: 9.2 mg/dL (ref 8.9–10.3)
Chloride: 103 mmol/L (ref 98–111)
Creatinine, Ser: 1.63 mg/dL — ABNORMAL HIGH (ref 0.61–1.24)
GFR, Estimated: 40 mL/min — ABNORMAL LOW (ref >60)
Glucose, Bld: 102 mg/dL — ABNORMAL HIGH (ref 70–99)
Potassium: 4 mmol/L (ref 3.5–5.1)
Sodium: 135 mmol/L (ref 135–145)
Total Bilirubin: 1.2 mg/dL (ref 0.3–1.2)
Total Protein: 7.1 g/dL (ref 6.5–8.1)

## 2020-11-04 LAB — LACTATE DEHYDROGENASE: LDH: 154 U/L (ref 98–192)

## 2020-11-04 NOTE — Progress Notes (Signed)
Nutrition Assessment   Reason for Assessment: Provider request   ASSESSMENT: 87 year old male with myelodysplastic syndrome. He is receiving Azacitidine. Patient followed by Dr. Larna Daughters.   Past medical history of COPD, chronic respiratory failure with hypoxia on 3L O2 at baseline, GERD, CKD stage IIIb.  Met with patient in clinic. He reports appetite comes and goes, says he has good days and bad days. Patient reports he is taking appetite stimulant daily, but has not seen much of a difference. Patient lives alone and does not often cook at home. He recalls mostly frozen meals and soups when eating at home. He reports having a lot of support from friends and has a neighbor that goes to the store for him and visits frequently. Patient drinks 1-2 Ensure Original and likes to snack on fruit cups, pastry's and occasionally will make milkshakes using Ensure. Patient reports drinking lots of water and some juice. Yesterday was a "good day", he had a pastry and Ensure for breakfast, 1/2 egg salad for lunch, and 1/2 country fried steak, some of his mashed potatoes, and fried okra for dinner. Patient recalls "bad day" as no breakfast, sandwich for lunch, fruit cup with Ensure for dinner.     Medications: Zofran, Protonix, Compazine, Marinol   Labs: Glucose 102, BUN 26, Cr 1.63, Hgb 9.9   Anthropometrics: Patient weighed 134.4 lb in clinic today increased from 131 lb on 6/6 decreased from 137 lb on 5/25  Height: 6'4" Weight: 61.1 kg (in clinic today) UBW: 150 lb (~1 yr ago per pt) BMI: 16.29    NUTRITION DIAGNOSIS: Unintentional weight loss related to myelodysplasia syndrome and chronic respiratory failure on 3 L oxygen as evidenced by 16 lb (10.7%) decrease from reported usual body weight over the last year. This is not significant for time frame, however concerning given advanced age with chronic comorbidities   INTERVENTION:  Discussed strategies for poor appetite - small frequent meals,  setting alarm for reminder to eat every 3 hours, making every bite count, choosing high calorie high protein foods - handout provided Educated on easy to chew and swallow foods for ease of intake Soft, moist high protein food ideas - handout provided Educated patient to take appetite stimulant 2x daily before meals as prescribed  Patient will drink 3 Ensure/day and recommended to switch to Ensure Plus for more calories and protein (350 kcal, 16 g protein each)  Patient politely declined need for Ensure coupons or assistance with supplements   MONITORING, EVALUATION, GOAL: Patient will tolerate increased calories and protein to minimize further weight loss   Next Visit: Thursday July 7 in infusion

## 2020-11-08 ENCOUNTER — Other Ambulatory Visit (HOSPITAL_COMMUNITY): Payer: Self-pay

## 2020-11-11 ENCOUNTER — Inpatient Hospital Stay (HOSPITAL_COMMUNITY)
Admission: EM | Admit: 2020-11-11 | Discharge: 2020-11-13 | DRG: 178 | Disposition: A | Discharge status: Home Health | Payer: Medicare Other | Attending: Family Medicine | Admitting: Family Medicine

## 2020-11-11 ENCOUNTER — Other Ambulatory Visit: Payer: Self-pay

## 2020-11-11 ENCOUNTER — Inpatient Hospital Stay (HOSPITAL_COMMUNITY): Payer: Medicare Other

## 2020-11-11 ENCOUNTER — Encounter (HOSPITAL_COMMUNITY): Payer: Self-pay

## 2020-11-11 ENCOUNTER — Emergency Department (HOSPITAL_COMMUNITY): Payer: Medicare Other

## 2020-11-11 ENCOUNTER — Encounter (HOSPITAL_COMMUNITY): Payer: Self-pay | Admitting: Hematology

## 2020-11-11 DIAGNOSIS — R55 Syncope and collapse: Secondary | ICD-10-CM

## 2020-11-11 DIAGNOSIS — K219 Gastro-esophageal reflux disease without esophagitis: Secondary | ICD-10-CM | POA: Diagnosis present

## 2020-11-11 DIAGNOSIS — I493 Ventricular premature depolarization: Secondary | ICD-10-CM | POA: Diagnosis present

## 2020-11-11 DIAGNOSIS — J449 Chronic obstructive pulmonary disease, unspecified: Secondary | ICD-10-CM | POA: Diagnosis present

## 2020-11-11 DIAGNOSIS — R911 Solitary pulmonary nodule: Secondary | ICD-10-CM | POA: Diagnosis not present

## 2020-11-11 DIAGNOSIS — D469 Myelodysplastic syndrome, unspecified: Secondary | ICD-10-CM

## 2020-11-11 DIAGNOSIS — N183 Chronic kidney disease, stage 3 unspecified: Secondary | ICD-10-CM | POA: Diagnosis present

## 2020-11-11 DIAGNOSIS — J9611 Chronic respiratory failure with hypoxia: Secondary | ICD-10-CM

## 2020-11-11 DIAGNOSIS — U071 COVID-19: Secondary | ICD-10-CM | POA: Diagnosis present

## 2020-11-11 DIAGNOSIS — N1832 Chronic kidney disease, stage 3b: Secondary | ICD-10-CM | POA: Diagnosis present

## 2020-11-11 DIAGNOSIS — Z841 Family history of disorders of kidney and ureter: Secondary | ICD-10-CM | POA: Diagnosis not present

## 2020-11-11 DIAGNOSIS — D696 Thrombocytopenia, unspecified: Secondary | ICD-10-CM | POA: Diagnosis present

## 2020-11-11 DIAGNOSIS — R0602 Shortness of breath: Secondary | ICD-10-CM | POA: Diagnosis not present

## 2020-11-11 LAB — COMPREHENSIVE METABOLIC PANEL
ALT: 16 U/L (ref 0–44)
AST: 19 U/L (ref 15–41)
Albumin: 3.2 g/dL — ABNORMAL LOW (ref 3.5–5.0)
Alkaline Phosphatase: 65 U/L (ref 38–126)
Anion gap: 10 (ref 5–15)
BUN: 26 mg/dL — ABNORMAL HIGH (ref 8–23)
CO2: 22 mmol/L (ref 22–32)
Calcium: 10.1 mg/dL (ref 8.9–10.3)
Chloride: 103 mmol/L (ref 98–111)
Creatinine, Ser: 1.84 mg/dL — ABNORMAL HIGH (ref 0.61–1.24)
GFR, Estimated: 35 mL/min — ABNORMAL LOW (ref >60)
Glucose, Bld: 119 mg/dL — ABNORMAL HIGH (ref 70–99)
Potassium: 3.5 mmol/L (ref 3.5–5.1)
Sodium: 135 mmol/L (ref 135–145)
Total Bilirubin: 1.4 mg/dL — ABNORMAL HIGH (ref 0.3–1.2)
Total Protein: 6.9 g/dL (ref 6.5–8.1)

## 2020-11-11 LAB — TROPONIN I (HIGH SENSITIVITY)
Troponin I (High Sensitivity): 9 ng/L (ref <18)
Troponin I (High Sensitivity): 9 ng/L (ref <18)

## 2020-11-11 LAB — CBC
HCT: 28.8 % — ABNORMAL LOW (ref 39.0–52.0)
Hemoglobin: 8.8 g/dL — ABNORMAL LOW (ref 13.0–17.0)
MCH: 26 pg (ref 26.0–34.0)
MCHC: 30.6 g/dL (ref 30.0–36.0)
MCV: 85.2 fL (ref 80.0–100.0)
Platelets: 62 10*3/uL — ABNORMAL LOW (ref 150–400)
RBC: 3.38 MIL/uL — ABNORMAL LOW (ref 4.22–5.81)
RDW: 18.6 % — ABNORMAL HIGH (ref 11.5–15.5)
WBC: 7.3 10*3/uL (ref 4.0–10.5)
nRBC: 0.5 % — ABNORMAL HIGH (ref 0.0–0.2)

## 2020-11-11 LAB — LACTIC ACID, PLASMA: Lactic Acid, Venous: 2.3 mmol/L (ref 0.5–1.9)

## 2020-11-11 LAB — PROTIME-INR
INR: 1.1 (ref 0.8–1.2)
Prothrombin Time: 13.8 seconds (ref 11.4–15.2)

## 2020-11-11 LAB — C-REACTIVE PROTEIN: CRP: 8.6 mg/dL — ABNORMAL HIGH (ref <1.0)

## 2020-11-11 LAB — D-DIMER, QUANTITATIVE: D-Dimer, Quant: 2.82 ug/mL-FEU — ABNORMAL HIGH (ref 0.00–0.50)

## 2020-11-11 LAB — GLUCOSE, CAPILLARY: Glucose-Capillary: 137 mg/dL — ABNORMAL HIGH (ref 70–99)

## 2020-11-11 LAB — RESP PANEL BY RT-PCR (FLU A&B, COVID) ARPGX2
Influenza A by PCR: NEGATIVE
Influenza B by PCR: NEGATIVE
SARS Coronavirus 2 by RT PCR: POSITIVE — AB

## 2020-11-11 LAB — LACTATE DEHYDROGENASE: LDH: 149 U/L (ref 98–192)

## 2020-11-11 LAB — CBG MONITORING, ED: Glucose-Capillary: 98 mg/dL (ref 70–99)

## 2020-11-11 LAB — FERRITIN: Ferritin: 559 ng/mL — ABNORMAL HIGH (ref 24–336)

## 2020-11-11 LAB — FIBRINOGEN: Fibrinogen: 603 mg/dL — ABNORMAL HIGH (ref 210–475)

## 2020-11-11 MED ORDER — METHYLPREDNISOLONE SODIUM SUCC 125 MG IJ SOLR: 125.0000 mg | Freq: Once | INTRAMUSCULAR | Status: AC | PRN

## 2020-11-11 MED ORDER — ALBUTEROL SULFATE HFA 108 (90 BASE) MCG/ACT IN AERS: 2.0000 | INHALATION_SPRAY | Freq: Once | RESPIRATORY_TRACT | Status: AC | PRN

## 2020-11-11 MED ORDER — SODIUM CHLORIDE 0.9 % IV SOLN: INTRAVENOUS | Status: DC | PRN

## 2020-11-11 MED ORDER — METHYLPREDNISOLONE SODIUM SUCC 125 MG IJ SOLR: 125.0000 mg | Freq: Once | INTRAMUSCULAR | Status: DC | PRN

## 2020-11-11 MED ORDER — ENSURE ENLIVE PO LIQD: 237.0000 mL | Freq: Two times a day (BID) | ORAL | Status: DC

## 2020-11-11 MED ORDER — POTASSIUM CHLORIDE IN NACL 20-0.9 MEQ/L-% IV SOLN: INTRAVENOUS | Status: AC

## 2020-11-11 MED ORDER — EPINEPHRINE 0.3 MG/0.3ML IJ SOAJ: 0.3000 mg | Freq: Once | INTRAMUSCULAR | Status: DC | PRN

## 2020-11-11 MED ORDER — EPINEPHRINE 0.3 MG/0.3ML IJ SOAJ: 0.3000 mg | Freq: Once | INTRAMUSCULAR | Status: AC | PRN

## 2020-11-11 MED ORDER — IOHEXOL 350 MG/ML SOLN
60.0000 mL | Freq: Once | INTRAVENOUS | Status: AC | PRN
  Administered 2020-11-11: 60 mL via INTRAVENOUS

## 2020-11-11 MED ORDER — ALBUTEROL SULFATE HFA 108 (90 BASE) MCG/ACT IN AERS
2.0000 | INHALATION_SPRAY | Freq: Four times a day (QID) | RESPIRATORY_TRACT | Status: DC
  Administered 2020-11-11 – 2020-11-12 (×3): 2 via RESPIRATORY_TRACT
  Filled 2020-11-11 (×2): qty 6.7

## 2020-11-11 MED ORDER — SODIUM CHLORIDE 0.9 % IV SOLN: INTRAVENOUS | Status: AC | PRN

## 2020-11-11 MED ORDER — SODIUM CHLORIDE 0.9 % IV BOLUS
500.0000 mL | Freq: Once | INTRAVENOUS | Status: AC
  Administered 2020-11-11: 500 mL via INTRAVENOUS

## 2020-11-11 MED ORDER — DIPHENHYDRAMINE HCL 50 MG/ML IJ SOLN: 50.0000 mg | Freq: Once | INTRAMUSCULAR | Status: DC | PRN

## 2020-11-11 MED ORDER — POLYETHYLENE GLYCOL 3350 17 G PO PACK: 17.0000 g | PACK | Freq: Every day | ORAL | Status: DC | PRN

## 2020-11-11 MED ORDER — FAMOTIDINE IN NACL 20-0.9 MG/50ML-% IV SOLN: 20.0000 mg | Freq: Once | INTRAVENOUS | Status: AC | PRN

## 2020-11-11 MED ORDER — FAMOTIDINE IN NACL 20-0.9 MG/50ML-% IV SOLN: 20.0000 mg | Freq: Once | INTRAVENOUS | Status: DC | PRN

## 2020-11-11 MED ORDER — UMECLIDINIUM BROMIDE 62.5 MCG/INH IN AEPB
1.0000 | INHALATION_SPRAY | Freq: Every day | RESPIRATORY_TRACT | Status: DC
  Administered 2020-11-13: 1 via RESPIRATORY_TRACT
  Filled 2020-11-11 (×2): qty 7

## 2020-11-11 MED ORDER — ONDANSETRON HCL 4 MG/2ML IJ SOLN: 4.0000 mg | Freq: Four times a day (QID) | INTRAMUSCULAR | Status: DC | PRN

## 2020-11-11 MED ORDER — ONDANSETRON HCL 4 MG PO TABS: 4.0000 mg | ORAL_TABLET | Freq: Four times a day (QID) | ORAL | Status: DC | PRN

## 2020-11-11 MED ORDER — ALBUTEROL SULFATE HFA 108 (90 BASE) MCG/ACT IN AERS: 2.0000 | INHALATION_SPRAY | Freq: Once | RESPIRATORY_TRACT | Status: DC | PRN

## 2020-11-11 MED ORDER — DIPHENHYDRAMINE HCL 50 MG/ML IJ SOLN: 50.0000 mg | Freq: Once | INTRAMUSCULAR | Status: AC | PRN

## 2020-11-11 MED ORDER — ACETAMINOPHEN 325 MG PO TABS: 650.0000 mg | ORAL_TABLET | Freq: Four times a day (QID) | ORAL | Status: DC | PRN

## 2020-11-11 MED ORDER — TIOTROPIUM BROMIDE MONOHYDRATE 18 MCG IN CAPS: 1.0000 | ORAL_CAPSULE | Freq: Every day | RESPIRATORY_TRACT | Status: DC

## 2020-11-11 MED ORDER — BEBTELOVIMAB 175 MG/2 ML IV (EUA): 175.0000 mg | Freq: Once | INTRAMUSCULAR | Status: DC

## 2020-11-11 MED ORDER — GUAIFENESIN-DM 100-10 MG/5ML PO SYRP: 5.0000 mL | ORAL_SOLUTION | ORAL | Status: DC | PRN

## 2020-11-11 MED ORDER — BEBTELOVIMAB 175 MG/2 ML IV (EUA)
175.0000 mg | Freq: Once | INTRAMUSCULAR | Status: AC
  Administered 2020-11-11: 175 mg via INTRAVENOUS
  Filled 2020-11-11: qty 2

## 2020-11-11 NOTE — Progress Notes (Signed)
Patient has arrived to room 339,3rd floor. Report was given by Antoine Poche from ED. Patient placed on precautions due to Covid positive test. Vital signs stable. Alert and oriented. Will continue to monitor.

## 2020-11-11 NOTE — H&P (Addendum)
History and Physical    ANTONYO HINDERER JJH:[id number] DOB: [birth year] DOA: 11/11/2020  PCP: Sharilyn Sites, MD   Patient coming from: Home  I have personally briefly reviewed patient's old medical records in [place]  Chief Complaint: Weakness, SOB, Cough  HPI: DURWARD MATRANGA is a 87 y.o. male with medical history significant for COPD on 3L, MDS, CKD 3 on 3 L .  Patient was at the outpatient lab have been blood work drawn when he had a syncopal episode.  Patient reports prior dizziness and knew he was going to pass out.  He denies chest pain.  Patient's started having symptoms of weakness, cough, congestion and difficulty breathing and generally feeling unwell- on Friday- 6/24.  He reports poor oral intake.  No vomiting, no loose stools.  No known COVID-positive contacts.  He is vaccinated against COVID X 3.  On arrival to the ED, patient initially was confused.  But on my evaluation patient is awake alert oriented x4 and able to give me a detailed history.  ED Course: Temperature 97.9.  Heart rate 80s to 90s.  Respiratory rate 16-23.  Blood pressure systolic 81-856.  O2 sats Greater than 94% on home 3 L. COVID test positive.  Platelets low 62-about baseline.  Potassium 3.5.  Troponin 9x2.  Lactic acid 2.3.  CTA no PE shows scarring or fibrosis.  Head CT-shows pansinusitis without acute intracranial abnormality.  EKG shows sinus rhythm without significant abnormalities. 500 mill bolus given.  Hospitalist to admit for syncope, was also unsteady on his feet.  Review of Systems: As per HPI all other systems reviewed and negative.  Past Medical History:  Diagnosis Date   Bone cancer ([place])    Chronic kidney insufficiency    COPD (chronic obstructive pulmonary disease) (HCC)    GERD (gastroesophageal reflux disease)    Pulmonary nodule     Past Surgical History:  Procedure Laterality Date   BACK SURGERY     CATARACT EXTRACTION W/PHACO Right 03/09/2016   Procedure: CATARACT  EXTRACTION PHACO AND INTRAOCULAR LENS PLACEMENT RIGHT EYE CDE=8.58;  Surgeon: Tonny Branch, MD;  Location: [facility];  Service: Ophthalmology;  Laterality: Right;  right   CATARACT EXTRACTION W/PHACO Left 04/13/2016   Procedure: CATARACT EXTRACTION PHACO AND INTRAOCULAR LENS PLACEMENT (IOC);  Surgeon: Tonny Branch, MD;  Location: [facility];  Service: Ophthalmology;  Laterality: Left;  CDE: 8.08   KYPHOPLASTY Bilateral 06/09/2019   Procedure: T11 KYPHOPLASTY;  Surgeon: Consuella Lose, MD;  Location: [facility];  Service: Neurosurgery;  Laterality: Bilateral;   None to Date  10/01/15   PORTACATH PLACEMENT Left 10/11/2020   Procedure: INSERTION PORT-A-CATH (attached catheter in left internal jugular);  Surgeon: Virl Cagey, MD;  Location: [facility];  Service: General;  Laterality: Left;     reports that he quit smoking about 30 years ago. His smoking use included cigarettes and cigars. He has never used smokeless tobacco. He reports previous alcohol use. He reports that he does not use drugs.  No Known Allergies  Family History  Problem Relation Age of Onset   Kidney disease Mother    Kidney disease Sister    Colon cancer Neg Hx    Gastric cancer Neg Hx    Esophageal cancer Neg Hx     Prior to Admission medications   Medication Sig Start Date End Date Taking? Authorizing Provider  acetaminophen (TYLENOL) 325 MG tablet Take 2 tablets (650 mg total) by mouth every 6 (six) hours as needed for mild  pain, fever or headache (or Fever >/= 101). 04/04/20  Yes Carrell Palmatier, Courage, MD  albuterol (VENTOLIN HFA) 108 (90 Base) MCG/ACT inhaler Inhale 2 puffs into the lungs every 4 (four) hours as needed for wheezing or shortness of breath. 04/04/20  Yes Roxan Hockey, MD  dronabinol (MARINOL) 2.5 MG capsule Take 1 capsule (2.5 mg total) by mouth 2 (two) times daily before a meal. 10/21/20  Yes Pennington, Rebekah M, PA-C  prochlorperazine (COMPAZINE) 10 MG tablet Take 1 tablet (10 mg total) by mouth every 6 (six)  hours as needed for nausea or vomiting. 09/13/20  Yes Derek Jack, MD  tiotropium (SPIRIVA HANDIHALER) 18 MCG inhalation capsule Place 1 capsule (18 mcg total) into inhaler and inhale daily. 04/04/20  Yes Travious Vanover, Courage, MD  azaCITIDine 5 mg/2 mLs in lactated ringers infusion Inject 75 mg/m2 into the vein daily. Days 1-7 every 28 days Patient not taking: Reported on 11/11/2020 09/16/20   [provider]  doxycycline (VIBRA-TABS) 100 MG tablet Take 100 mg by mouth 2 (two) times daily. Patient not taking: Reported on 11/11/2020 08/26/20   [provider]  epoetin alfa (PROCRIT) 31540 UNIT/ML injection Inject 1 mL (10,000 Units total) into the skin once a week. Patient not taking: No sig reported 04/04/20   Roxan Hockey, MD  ondansetron (ZOFRAN) 4 MG tablet Take 1 tablet (4 mg total) by mouth every 6 (six) hours as needed for nausea or vomiting. Patient not taking: No sig reported 0/86/76   Delora Fuel, MD  pantoprazole (PROTONIX) 40 MG tablet Take 40 mg by mouth daily. Pt no longer taking Patient not taking: Reported on 11/11/2020 06/21/19   [provider]  tamsulosin (FLOMAX) 0.4 MG CAPS capsule Take 1 capsule (0.4 mg total) by mouth daily after supper. Patient not taking: Reported on 11/11/2020 04/04/20   Roxan Hockey, MD    Physical Exam: Vitals:   11/11/20 1330 11/11/20 1400 11/11/20 1430 11/11/20 1500  BP: 102/68 97/65 105/64 102/67  Pulse: 89 90 88 82  Resp: 16 16 (!) 23 18  Temp:      TempSrc:      SpO2: 100% 97% 97% 92%  Weight:        Constitutional: NAD, calm, comfortable Vitals:   11/11/20 1330 11/11/20 1400 11/11/20 1430 11/11/20 1500  BP: 102/68 97/65 105/64 102/67  Pulse: 89 90 88 82  Resp: 16 16 (!) 23 18  Temp:      TempSrc:      SpO2: 100% 97% 97% 92%  Weight:       Eyes: PERRL, lids and conjunctivae normal ENMT: Mucous membranes are moist.  Neck: normal, supple, no masses, no thyromegaly Respiratory: clear to auscultation  bilaterally, no wheezing, no crackles. Normal respiratory effort. No accessory muscle use.  Cardiovascular: Regular rate and rhythm, no murmurs / rubs / gallops. No extremity edema. 2+ pedal pulses.  Port A cath- left upper chest, slight erythema on overlying skin, no purulence, surrounding swelling, no appreciable tenderness. Abdomen: no tenderness, no masses palpated. No hepatosplenomegaly. Bowel sounds positive.  Musculoskeletal: no clubbing / cyanosis. No joint deformity upper and lower extremities. Good ROM, no contractures. Normal muscle tone.  Skin: no rashes, lesions, ulcers. No induration Neurologic: No cranial nerve abnormality, moving extremities spontaneously. Psychiatric: Normal judgment and insight. Alert and oriented x 3. Normal mood.   Labs on Admission: I have personally reviewed following labs and imaging studies  CBC: Recent Labs  Lab 11/11/20 1125  WBC 7.3  HGB 8.8*  HCT  28.8*  MCV 85.2  PLT 62*   Basic Metabolic Panel: Recent Labs  Lab 11/11/20 1125  NA 135  K 3.5  CL 103  CO2 22  GLUCOSE 119*  BUN 26*  CREATININE 1.84*  CALCIUM 10.1   Liver Function Tests: Recent Labs  Lab 11/11/20 1125  AST 19  ALT 16  ALKPHOS 65  BILITOT 1.4*  PROT 6.9  ALBUMIN 3.2*   Coagulation Profile: Recent Labs  Lab 11/11/20 1125  INR 1.1   CBG: Recent Labs  Lab 11/11/20 1118  GLUCAP 98    Radiological Exams on Admission: CT HEAD WO CONTRAST  Result Date: 11/11/2020 CLINICAL DATA:  Syncope, delirium. EXAM: CT HEAD WITHOUT CONTRAST TECHNIQUE: Contiguous axial images were obtained from the base of the skull through the vertex without intravenous contrast. COMPARISON:  October 19, 2016. FINDINGS: Brain: No evidence of acute infarction, hemorrhage, hydrocephalus, extra-axial collection or mass lesion/mass effect. Vascular: No hyperdense vessel or unexpected calcification. Skull: Normal. Negative for fracture or focal lesion. Sinuses/Orbits: Pansinusitis is noted.  Other: None. IMPRESSION: No acute intracranial abnormality seen.  Pansinusitis is noted. Electronically Signed   By: Marijo Conception M.D.   On: 11/11/2020 12:53   CT Angio Chest Pulmonary Embolism (PE) W or WO Contrast  Result Date: 11/11/2020 CLINICAL DATA:  Syncopal episode. EXAM: CT ANGIOGRAPHY CHEST WITH CONTRAST TECHNIQUE: Multidetector CT imaging of the chest was performed using the standard protocol during bolus administration of intravenous contrast. Multiplanar CT image reconstructions and MIPs were obtained to evaluate the vascular anatomy. CONTRAST:  80mL OMNIPAQUE IOHEXOL 350 MG/ML SOLN COMPARISON:  June 26, 2020 FINDINGS: Cardiovascular: A left-sided venous Port-A-Cath is in place. Satisfactory opacification of the pulmonary arteries to the segmental level. No evidence of pulmonary embolism. Normal heart size with marked severity coronary artery calcification. No pericardial effusion. Mediastinum/Nodes: No enlarged mediastinal, hilar, or axillary lymph nodes. Thyroid gland, trachea, and esophagus demonstrate no significant findings. Lungs/Pleura: Stable areas of scarring and/or atelectasis are seen within the posteromedial aspects of the bilateral apices. Additional stable fibrotic changes are noted within the posterior aspect of the bilateral upper lobes and bilateral lower lobes. A stable 4 mm noncalcified lung nodule is seen within the anterolateral aspect of the right upper lobe (axial CT image 56, CT series 6). Upper Abdomen: No acute abnormality. Musculoskeletal: Multiple chronic compression fracture deformities are seen throughout the mid and lower thoracic spine with evidence of prior vertebroplasty noted. Review of the MIP images confirms the above findings. IMPRESSION: 1. No evidence of pulmonary embolism. 2. Stable areas of scarring and fibrosis without evidence of superimposed acute cardiopulmonary disease. Electronically Signed   By: Virgina Norfolk M.D.   On: 11/11/2020 16:16    DG Chest Port 1 View  Result Date: 11/11/2020 CLINICAL DATA:  Shortness of breath, syncopal episode EXAM: PORTABLE CHEST 1 VIEW COMPARISON:  Oct 11, 2020. FINDINGS: LEFT-sided Port-A-Cath in situ terminates at the caval to atrial junction. Cardiomediastinal contours and hilar structures are stable. Interstitial prominence in the RIGHT upper chest persists along with septal thickening at the lung bases bilaterally. No lobar consolidation. No pneumothorax.  No pleural effusion. Signs of spinal cement augmentation as before without acute or destructive bony finding on limited assessment. IMPRESSION: Stable interstitial prominence in the RIGHT upper chest along with septal thickening at the lung bases bilaterally. Findings reflect chronic changes and are not associated with lobar consolidation or effusion. Electronically Signed   By: Zetta Bills M.D.   On: 11/11/2020 12:04  EKG: Independently reviewed.  Sinus rhythm, rate 89, QTc 449.  No significant ST or T wave abnormalities.  Assessment/Plan Principal Problem:   Syncope Active Problems:   Thrombocytopenia (HCC)   COPD (chronic obstructive pulmonary disease) (HCC)   Myelodysplastic syndrome (HCC)   CKD (chronic kidney disease), stage IIIB   Chronic respiratory failure with hypoxia /// 3L/min at baseline   COVID-19 virus infection    Syncope-in the setting of COVID-19 infection.  Blood pressure systolic recorded at 782/95 on arrival to the ED.  Likely orthostatic.  CTA chest without acute abnormality, no PE shows scarring or fibrosis.  O2 sats stable on his home 3 L O2. Trops unremarkable. -Check orthostatic vitals -500 mill bolus given, continue N/s 75cc/hr x 20hrs -Obtain echocardiogram, none on file. - PT eval  COVID-positive infection-reports generalized weakness, congestion, cough.  CTA chest without acute abnormality, no PE.  O2 sats > 94% on home 3 L. Vaccinated X 3.  -Talked to pharmacy, patient does not meet criteria for  Paxlovid -Meets criteria for monoclonal antibody infusion, considering immunosuppressed state and high risk for severe COVID, Bebtelovimab has been ordered. -Obtain and trend inflammatory markers  COPD with chronic respiratory failure-stable.  On home 3 L. -Mucolytic's PRN -Bronchodilators ordered  Myelodysplastic syndrome on chemotherapy-with Port-A-Cath right upper chest.  Follows with Dr. Delton Coombes.  On active chemotherapy.  Platelets today 62, normal range from 31-78 over the past month.  Also intermittently required blood transfusions for anemia.  Hemoglobin today 8.8.  CKD 3B- creatinine 1.84, recent baseline 1.6-1.7.  DVT prophylaxis: SCDS Code Status: Full code Disposition Plan: ~ 1- 2 days Consults called: None Admission status: Obs tele   Bethena Roys MD [facility]  11/11/2020, 7:30 PM

## 2020-11-11 NOTE — ED Provider Notes (Signed)
[facility] Emergency Department Provider Note MRN:  [id number]  Arrival date & time: 11/11/20     Chief Complaint   Loss of Consciousness   History of Present Illness   [name] is a 87 y.o. year-old male with a history of myelodysplastic syndrome, COPD, CKD presenting to the ED with chief complaint of loss of consciousness.  Patient was getting blood drawn at the lab as an outpatient today, experienced a syncopal episode.  Sent here for evaluation.  Patient is confused.  He states he had a rough weekend, was short of breath.  Does not syncopize with blood draws in the past.  I was unable to obtain an accurate HPI, PMH, or ROS due to the patient's altered mental status.  Level 5 caveat.  Review of Systems  Positive for shortness of breath, syncope, altered mental status.  Patient's Health History    Past Medical History:  Diagnosis Date   Bone cancer ([place])    Chronic kidney insufficiency    COPD (chronic obstructive pulmonary disease) (HCC)    GERD (gastroesophageal reflux disease)    Pulmonary nodule     Past Surgical History:  Procedure Laterality Date   BACK SURGERY     CATARACT EXTRACTION W/PHACO Right 03/09/2016   Procedure: CATARACT EXTRACTION PHACO AND INTRAOCULAR LENS PLACEMENT RIGHT EYE CDE=8.58;  Surgeon: Tonny Branch, MD;  Location: [facility];  Service: Ophthalmology;  Laterality: Right;  right   CATARACT EXTRACTION W/PHACO Left 04/13/2016   Procedure: CATARACT EXTRACTION PHACO AND INTRAOCULAR LENS PLACEMENT (IOC);  Surgeon: Tonny Branch, MD;  Location: [facility];  Service: Ophthalmology;  Laterality: Left;  CDE: 8.08   KYPHOPLASTY Bilateral 06/09/2019   Procedure: T11 KYPHOPLASTY;  Surgeon: Consuella Lose, MD;  Location: [facility];  Service: Neurosurgery;  Laterality: Bilateral;   None to Date  10/01/15   PORTACATH PLACEMENT Left 10/11/2020   Procedure: INSERTION PORT-A-CATH (attached catheter in left internal jugular);  Surgeon: Virl Cagey, MD;  Location: [facility];  Service: General;  Laterality: Left;    Family History  Problem Relation Age of Onset   Kidney disease Mother    Kidney disease Sister    Colon cancer Neg Hx    Gastric cancer Neg Hx    Esophageal cancer Neg Hx     Social History   Socioeconomic History   Marital status: Widowed    Spouse name: Not on file   Number of children: Not on file   Years of education: Not on file   Highest education level: Not on file  Occupational History   Not on file  Tobacco Use   Smoking status: Former    Years: 15.00    Pack years: 0.00    Types: Cigarettes, Cigars    Quit date: 10/01/1990    Years since quitting: 30.1   Smokeless tobacco: Never   Tobacco comments:    Quit x 25-30 years; 2 cigars daily when smoked  Vaping Use   Vaping Use: Never used  Substance and Sexual Activity   Alcohol use: Not Currently    Alcohol/week: 0.0 standard drinks   Drug use: No   Sexual activity: Never    Birth control/protection: None  Other Topics Concern   Not on file  Social History Narrative   Not on file   Social Determinants of Health   Financial Resource Strain: High Risk   Difficulty of Paying Living Expenses: Hard  Food Insecurity: No Food Insecurity   Worried About Running  Out of Food in the Last Year: Never true   Ran Out of Food in the Last Year: Never true  Transportation Needs: No Transportation Needs   Lack of Transportation (Medical): No   Lack of Transportation (Non-Medical): No  Physical Activity: Inactive   Days of Exercise per Week: 0 days   Minutes of Exercise per Session: 0 min  Stress: No Stress Concern Present   Feeling of Stress : Not at all  Social Connections: Moderately Integrated   Frequency of Communication with Friends and Family: More than three times a week   Frequency of Social Gatherings with Friends and Family: More than three times a week   Attends Religious Services: More than 4 times per year   Active Member of [employer]  or Organizations: Yes   Attends Archivist Meetings: More than 4 times per year   Marital Status: Widowed  Human resources officer Violence: Not At Risk   Fear of Current or Ex-Partner: No   Emotionally Abused: No   Physically Abused: No   Sexually Abused: No     Physical Exam   Vitals:   11/11/20 1430 11/11/20 1500  BP: 105/64 102/67  Pulse: 88 82  Resp: (!) 23 18  Temp:    SpO2: 97% 92%    CONSTITUTIONAL: Ill-appearing, pale, NAD NEURO: Oriented to name, eyes are closed, moves all extremities EYES:  eyes equal and reactive ENT/NECK:  no LAD, no JVD CARDIO: Regular rate, well-perfused, normal S1 and S2 PULM:  CTAB no wheezing or rhonchi GI/GU:  normal bowel sounds, non-distended, non-tender MSK/SPINE:  No gross deformities, no edema SKIN:  no rash, atraumatic PSYCH:  Appropriate speech and behavior  *Additional and/or pertinent findings included in MDM below  Diagnostic and Interventional Summary    EKG Interpretation  Date/Time:  Monday November 11 2020 11:14:44 EDT Ventricular Rate:  89 PR Interval:  138 QRS Duration: 97 QT Interval:  369 QTC Calculation: 449 R Axis:   85 Text Interpretation: Sinus rhythm Ventricular premature complex Borderline right axis deviation Confirmed by Gerlene Fee [phone]) on 11/11/2020 4:29:24 PM        Labs Reviewed  RESP PANEL BY RT-PCR (FLU A&B, COVID) ARPGX2 - Abnormal; Notable for the following components:      Result Value   SARS Coronavirus 2 by RT PCR POSITIVE (*)    All other components within normal limits  CBC - Abnormal; Notable for the following components:   RBC 3.38 (*)    Hemoglobin 8.8 (*)    HCT 28.8 (*)    RDW 18.6 (*)    Platelets 62 (*)    nRBC 0.5 (*)    All other components within normal limits  COMPREHENSIVE METABOLIC PANEL - Abnormal; Notable for the following components:   Glucose, Bld 119 (*)    BUN 26 (*)    Creatinine, Ser 1.84 (*)    Albumin 3.2 (*)    Total Bilirubin 1.4 (*)    GFR,  Estimated 35 (*)    All other components within normal limits  LACTIC ACID, PLASMA - Abnormal; Notable for the following components:   Lactic Acid, Venous 2.3 (*)    All other components within normal limits  PROTIME-INR  URINALYSIS, ROUTINE W REFLEX MICROSCOPIC  CBG MONITORING, ED  TYPE AND SCREEN  TROPONIN I (HIGH SENSITIVITY)  TROPONIN I (HIGH SENSITIVITY)    CT Angio Chest Pulmonary Embolism (PE) W or WO Contrast  Final Result    CT HEAD WO CONTRAST  Final Result    DG Chest Port 1 View  Final Result      Medications  sodium chloride 0.9 % bolus 500 mL (0 mLs Intravenous Stopped 11/11/20 1249)  iohexol (OMNIPAQUE) 350 MG/ML injection 60 mL (60 mLs Intravenous Contrast Given 11/11/20 1539)     Procedures  /  Critical Care .1-3 Lead EKG Interpretation  Date/Time: 11/11/2020 4:29 PM Performed by: Maudie Flakes, MD Authorized by: Maudie Flakes, MD     Interpretation: normal     ECG rate:  80s   ECG rate assessment: normal     Rhythm: sinus rhythm     Ectopy: none   Comments:     Cardiac monitoring was ordered to monitor the patient for dysrhythmia.  I personally interpreted the patient's cardiac monitor while at the bedside.    ED Course and Medical Decision Making  I have reviewed the triage vital signs, the nursing notes, and pertinent available records from the EMR.  Listed above are laboratory and imaging tests that I personally ordered, reviewed, and interpreted and then considered in my medical decision making (see below for details).  Shortness of breath over the weekend, syncope today, history of cancer, considering PE, anemia, metabolic disarray, awaiting initial labs and chest x-ray, will consider CTA.     Work-up overall reassuring, troponin negative x2, CTA negative for PE.  Patient clinically is much improved, awake, alert.  Remains on baseline 3 L nasal cannula.  Lives alone, still unsteady on feet, will request hospitalist admission for  syncope.  Barth Kirks. Sedonia Small, MD [place] [email or web address]  Final Clinical Impressions(s) / ED Diagnoses     ICD-10-CM   1. Syncope and collapse  R55       ED Discharge Orders     None        Discharge Instructions Discussed with and Provided to Patient:   Discharge Instructions   None       Maudie Flakes, MD 11/11/20 [phone]

## 2020-11-11 NOTE — ED Notes (Signed)
Pt to ct, pt sitting up in bed, pt offers no complaints,

## 2020-11-11 NOTE — Progress Notes (Signed)
Gave patient incentive spirometer and flutter.  Explained usage on both but I did have patient exhibit on IS back to me.  Patient gave good effort and was able to achieve 1734ml X10.  Left IS and flutter at bedside for patient.

## 2020-11-11 NOTE — ED Notes (Addendum)
Date and time results received: 11/11/20 1:23 PM  Test: Covid  Critical Value: positive  Name of Provider Notified: Dr. Sedonia Small  Orders Received? Or Actions Taken?: See orders

## 2020-11-11 NOTE — ED Notes (Signed)
Date and time results received: 11/11/20. Now  Test: Lactic acid Critical Value: 2.3  Name of Provider Notified: Dr. Sedonia Small  Orders Received? Or Actions Taken? See orders

## 2020-11-11 NOTE — ED Triage Notes (Signed)
Pt was in outpt lab having blood work and had a syncopal episode.  Lab called a rapid response.  Reports pt is on home o2 at 3 liters continuously and ran out of oxygen while in lab.  Pt presently alert but but confused to place.  Denies pain.  Pt pale. Large bruise noted around portacath and bruise noted under left eye.

## 2020-11-11 NOTE — ED Notes (Signed)
Pt in bed talking on phone with family, resps even and unlabored, pt offers no complaints.

## 2020-11-12 ENCOUNTER — Observation Stay (HOSPITAL_COMMUNITY): Payer: Medicare Other

## 2020-11-12 DIAGNOSIS — U071 COVID-19: Secondary | ICD-10-CM | POA: Diagnosis present

## 2020-11-12 DIAGNOSIS — D696 Thrombocytopenia, unspecified: Secondary | ICD-10-CM | POA: Diagnosis present

## 2020-11-12 DIAGNOSIS — J449 Chronic obstructive pulmonary disease, unspecified: Secondary | ICD-10-CM | POA: Diagnosis present

## 2020-11-12 DIAGNOSIS — N1832 Chronic kidney disease, stage 3b: Secondary | ICD-10-CM | POA: Diagnosis present

## 2020-11-12 DIAGNOSIS — Z841 Family history of disorders of kidney and ureter: Secondary | ICD-10-CM | POA: Diagnosis not present

## 2020-11-12 DIAGNOSIS — J9611 Chronic respiratory failure with hypoxia: Secondary | ICD-10-CM | POA: Diagnosis present

## 2020-11-12 DIAGNOSIS — I493 Ventricular premature depolarization: Secondary | ICD-10-CM | POA: Diagnosis present

## 2020-11-12 DIAGNOSIS — K219 Gastro-esophageal reflux disease without esophagitis: Secondary | ICD-10-CM | POA: Diagnosis present

## 2020-11-12 DIAGNOSIS — R55 Syncope and collapse: Secondary | ICD-10-CM | POA: Diagnosis present

## 2020-11-12 DIAGNOSIS — D469 Myelodysplastic syndrome, unspecified: Secondary | ICD-10-CM | POA: Diagnosis present

## 2020-11-12 LAB — D-DIMER, QUANTITATIVE: D-Dimer, Quant: 2.7 ug/mL-FEU — ABNORMAL HIGH (ref 0.00–0.50)

## 2020-11-12 LAB — CBC WITH DIFFERENTIAL/PLATELET
Basophils Absolute: 0 10*3/uL (ref 0.0–0.1)
Basophils Relative: 0 %
Blasts: 3 %
Eosinophils Absolute: 0.1 10*3/uL (ref 0.0–0.5)
Eosinophils Relative: 2 %
HCT: 22.7 % — ABNORMAL LOW (ref 39.0–52.0)
Hemoglobin: 7 g/dL — ABNORMAL LOW (ref 13.0–17.0)
Lymphocytes Relative: 19 %
Lymphs Abs: 0.6 10*3/uL — ABNORMAL LOW (ref 0.7–4.0)
MCH: 26.4 pg (ref 26.0–34.0)
MCHC: 30.8 g/dL (ref 30.0–36.0)
MCV: 85.7 fL (ref 80.0–100.0)
Metamyelocytes Relative: 2 %
Monocytes Absolute: 0 10*3/uL — ABNORMAL LOW (ref 0.1–1.0)
Monocytes Relative: 0 %
Myelocytes: 2 %
Neutro Abs: 2.3 10*3/uL (ref 1.7–7.7)
Neutrophils Relative %: 72 %
Platelets: 50 10*3/uL — ABNORMAL LOW (ref 150–400)
RBC: 2.65 MIL/uL — ABNORMAL LOW (ref 4.22–5.81)
RDW: 18.8 % — ABNORMAL HIGH (ref 11.5–15.5)
WBC: 3.2 10*3/uL — ABNORMAL LOW (ref 4.0–10.5)
nRBC: 0 % (ref 0.0–0.2)

## 2020-11-12 LAB — COMPREHENSIVE METABOLIC PANEL
ALT: 13 U/L (ref 0–44)
AST: 14 U/L — ABNORMAL LOW (ref 15–41)
Albumin: 2.6 g/dL — ABNORMAL LOW (ref 3.5–5.0)
Alkaline Phosphatase: 53 U/L (ref 38–126)
Anion gap: 4 — ABNORMAL LOW (ref 5–15)
BUN: 26 mg/dL — ABNORMAL HIGH (ref 8–23)
CO2: 23 mmol/L (ref 22–32)
Calcium: 8.9 mg/dL (ref 8.9–10.3)
Chloride: 110 mmol/L (ref 98–111)
Creatinine, Ser: 1.5 mg/dL — ABNORMAL HIGH (ref 0.61–1.24)
GFR, Estimated: 44 mL/min — ABNORMAL LOW (ref >60)
Glucose, Bld: 92 mg/dL (ref 70–99)
Potassium: 3.8 mmol/L (ref 3.5–5.1)
Sodium: 137 mmol/L (ref 135–145)
Total Bilirubin: 0.9 mg/dL (ref 0.3–1.2)
Total Protein: 5.6 g/dL — ABNORMAL LOW (ref 6.5–8.1)

## 2020-11-12 LAB — MAGNESIUM: Magnesium: 1.9 mg/dL (ref 1.7–2.4)

## 2020-11-12 LAB — GLUCOSE, CAPILLARY
Glucose-Capillary: 88 mg/dL (ref 70–99)
Glucose-Capillary: 90 mg/dL (ref 70–99)
Glucose-Capillary: 96 mg/dL (ref 70–99)

## 2020-11-12 LAB — PHOSPHORUS: Phosphorus: 4.1 mg/dL (ref 2.5–4.6)

## 2020-11-12 LAB — FERRITIN: Ferritin: 474 ng/mL — ABNORMAL HIGH (ref 24–336)

## 2020-11-12 LAB — ECHOCARDIOGRAM COMPLETE
Area-P 1/2: 3.58 cm2
S' Lateral: 2.77 cm
Weight: 2128 oz

## 2020-11-12 LAB — C-REACTIVE PROTEIN: CRP: 7.8 mg/dL — ABNORMAL HIGH (ref <1.0)

## 2020-11-12 LAB — PREPARE RBC (CROSSMATCH)

## 2020-11-12 MED ORDER — SODIUM CHLORIDE 0.9% IV SOLUTION: Freq: Once | INTRAVENOUS | Status: AC

## 2020-11-12 MED ORDER — SODIUM CHLORIDE 0.9 % IV SOLN
100.0000 mg | Freq: Every day | INTRAVENOUS | Status: AC
  Administered 2020-11-13: 100 mg via INTRAVENOUS
  Filled 2020-11-12: qty 100

## 2020-11-12 MED ORDER — FUROSEMIDE 10 MG/ML IJ SOLN
40.0000 mg | Freq: Once | INTRAMUSCULAR | Status: AC
  Administered 2020-11-12: 40 mg via INTRAVENOUS
  Filled 2020-11-12: qty 4

## 2020-11-12 MED ORDER — ALBUTEROL SULFATE HFA 108 (90 BASE) MCG/ACT IN AERS
2.0000 | INHALATION_SPRAY | Freq: Two times a day (BID) | RESPIRATORY_TRACT | Status: DC
  Administered 2020-11-12 – 2020-11-13 (×2): 2 via RESPIRATORY_TRACT

## 2020-11-12 MED ORDER — SODIUM CHLORIDE 0.9 % IV SOLN
100.0000 mg | INTRAVENOUS | Status: AC
  Administered 2020-11-12 (×2): 100 mg via INTRAVENOUS
  Filled 2020-11-12: qty 100
  Filled 2020-11-12: qty 20

## 2020-11-12 NOTE — Progress Notes (Addendum)
Patient Demographics:    [name], is a 87 y.o. male, DOB - 07-31-1929, WLS:[id number]  Admit date - 11/11/2020   Admitting Physician Bethena Roys, MD  Outpatient Primary MD for the patient is Sharilyn Sites, MD  LOS - 0   Chief Complaint  Patient presents with   Loss of Consciousness        Subjective:    [name] today has no fevers, no emesis,  No chest pain,   Shob,    Assessment  & Plan :    Principal Problem:   Syncope Active Problems:   Myelodysplastic syndrome ([place])   COVID-19 virus infection   Chronic respiratory failure with hypoxia /// 3L/min at baseline   Thrombocytopenia (HCC)   COPD (chronic obstructive pulmonary disease) (HCC)   CKD (chronic kidney disease), stage IIIB   Brief Summary:- 87 y.o. male with medical history significant for COPD on 3L, MDS, CKD 3 on 3 L  who was admitted with Covid Infection on 11/11/20 (onset of symptoms 11/08/2020 onset of sxs 11/08/20), patient is vaccinated x3  A/p 1)COVID-positive infection-hypoxia is close to baseline, persistent URI symptoms including cough and congestion noted  CTA chest without acute abnormality,   Vaccinated X 3.  -Continue oxygen at 3 L/min -patient does not meet criteria for Paxlovid --Patient received monoclonal antibody Bebtelovimab -Start IV remdesivir -Fatigue, malaise, myalgias congestion persist COVID-19 Labs  Recent Labs    11/11/20 2019 11/12/20 0618 11/12/20 0619  DDIMER 2.82* 2.70*  --   FERRITIN 559*  --  474*  LDH 149  --   --   CRP 8.6*  --  7.8*    Lab Results  Component Value Date   SARSCOV2NAA POSITIVE (A) 11/11/2020   SARSCOV2NAA NEGATIVE 10/09/2020   [place] NEGATIVE 07/27/2020   [place] NEGATIVE 06/26/2020     2)COPD with chronic respiratory failure-stable.  On home 3 L. --Continue bronchodilators and mucolytics as per the splint oxygen -Oxygen requirement  appears close to baseline at this time   3)Myelodysplastic syndrome on chemotherapy- with Port-A-Cath right upper chest.  Follows with Dr. Delton Coombes.  On active chemotherapy.     4)Thrombocytopenia and  Anemia--- in the setting of underlying MDS as above #3 compounded by chemotherapy - --patient sees Dr. Delton Coombes  Platelets 62>>50 Hgb 8.8 >>7.0 -No evidence of ongoing bleeding,  -Transfuse 1 unit of PRBC on 11/12/20 for symptomatic anemia--(generalized weakness, fatigue, malaise, Dyspnea on exertion  and dizziness in the exertion)  4)CKD 3B- creatinine 1.84 >> 1.50  --recent baseline 1.6-1.7.  renally adjust medications, avoid nephrotoxic agents / dehydration  / hypotension  --5)Syncope- on telemetry monitored unit no significant arrhythmias,   -EKG sinus rhythm with PVCs  --echocardiogram pending to help to rule out significant aortic stenosis or other outflow obstruction, and also to evaluate EF and to rule out segmental/Regional wall motion abnormalities.   -CT head is negative for acute findings  6)Social/Ethics--spoke with daughter Ms Lahoma Rocker at [phone]----she is patient's HCPOA, she plans to travel here over the next day or 2 to be with patient, she has significantly recovering from COVID-19 infection which she contracted around October 21, 2020 -Planning to discharge home in the next day or 2 when daughter can make arrangements for somebody  to be with patient  Disposition/Need for in-Hospital Stay- patient unable to be discharged at this time due to symptomatic anemia requiring transfusion on COVID respiratory infection requiring IV remdesivir* --Planning to discharge home with [facility] in the next day or 2 when daughter can make arrangements for somebody to be with patient  Status is: Inpatient  Remains inpatient appropriate because: Please see disposition above  Disposition: The patient is from: Home              Anticipated d/c is to: Home              Anticipated d/c date  is: 1 day              Patient currently is not medically stable to d/c. Barriers: Not Clinically Stable-   Code Status :  -  Code Status: Full Code   Family Communication:      (patient is alert, awake and coherent)  -spoke with daughter Ms Lahoma Rocker at [phone]----she is patient's HCPOA  Consults  :  na  DVT Prophylaxis  :   - SCDs SCDs Start: 11/11/20 1916  Lab Results  Component Value Date   PLT 50 (L) 11/12/2020   Inpatient Medications  Scheduled Meds:  albuterol  2 puff Inhalation BID   feeding supplement  237 mL Oral BID BM   umeclidinium bromide  1 puff Inhalation Daily   Continuous Infusions:  sodium chloride     0.9 % NaCl with KCl 20 mEq / L 75 mL/hr at 11/11/20 2042   famotidine (PEPCID) IV     remdesivir 100 mg in NS 100 mL     [START ON 11/13/2020] remdesivir 100 mg in NS 100 mL     PRN Meds:.sodium chloride, acetaminophen, albuterol, diphenhydrAMINE, EPINEPHrine, famotidine (PEPCID) IV, guaiFENesin-dextromethorphan, methylPREDNISolone (SOLU-MEDROL) injection, ondansetron **OR** ondansetron (ZOFRAN) IV, polyethylene glycol    Anti-infectives (From admission, onward)    Start     Dose/Rate Route Frequency Ordered Stop   11/13/20 1000  remdesivir 100 mg in sodium chloride 0.9 % 100 mL IVPB        100 mg 200 mL/hr over 30 Minutes Intravenous Daily 11/12/20 1443 11/14/20 0959   11/12/20 1530  remdesivir 100 mg in sodium chloride 0.9 % 100 mL IVPB        100 mg 200 mL/hr over 30 Minutes Intravenous Every 30 min 11/12/20 1443 11/12/20 1629         Objective:   Vitals:   11/12/20 0200 11/12/20 0212 11/12/20 0600 11/12/20 0734  BP: 104/63  (!) 101/59   Pulse: 81  78   Resp: 18  16   Temp: 98.4 F (36.9 C)  98.2 F (36.8 C)   TempSrc: Oral  Oral   SpO2: 95% 96% 99% 99%  Weight:        Wt Readings from Last 3 Encounters:  11/11/20 60.3 kg  10/23/20 60.7 kg  10/21/20 59.4 kg     Intake/Output Summary (Last 24 hours) at 11/12/2020 1443 Last  data filed at 11/11/2020 2200 Gross per 24 hour  Intake --  Output 300 ml  Net -300 ml   Physical Exam  Gen:- Awake Alert, frail and elderly appearing  HEENT:- [place].AT, No sclera icterus Nose- [place] 3L/min Neck-Supple Neck,No JVD,.  Lungs-diminished breath sounds, no wheezing no rhonchi or rales  CV- S1, S2 normal, regular , left-sided Port-A-Cath in situ Abd-  +ve B.Sounds, Abd Soft, No tenderness,    Extremity/Skin:- No  edema,  pedal pulses present  Psych-affect is appropriate, oriented x3 Neuro-generalized weakness, no new focal deficits, no tremors   Data Review:   Micro Results Recent Results (from the past 240 hour(s))  Resp Panel by RT-PCR (Flu A&B, Covid) Nasopharyngeal Swab     Status: Abnormal   Collection Time: 11/11/20 11:51 AM   Specimen: Nasopharyngeal Swab; Nasopharyngeal(NP) swabs in vial transport medium  Result Value Ref Range Status   SARS Coronavirus 2 by RT PCR POSITIVE (A) NEGATIVE Final    Comment: RESULT CALLED TO, READ BACK BY AND VERIFIED WITH: BRANDY OAKLEY,RN @1321  11/11/2020 KAY (NOTE) SARS-CoV-2 target nucleic acids are DETECTED.  The SARS-CoV-2 RNA is generally detectable in upper respiratory specimens during the acute phase of infection. Positive results are indicative of the presence of the identified virus, but do not rule out bacterial infection or co-infection with other pathogens not detected by the test. Clinical correlation with patient history and other diagnostic information is necessary to determine patient infection status. The expected result is Negative.  Fact Sheet for Patients: EntrepreneurPulse.com.au  Fact Sheet for Healthcare Providers: IncredibleEmployment.be  This test is not yet approved or cleared by the [place] FDA and  has been authorized for detection and/or diagnosis of SARS-CoV-2 by FDA under an Emergency Use Authorization (EUA).  This EUA will remain in effect (meaning this  test ca n be used) for the duration of  the COVID-19 declaration under Section 564(b)(1) of the Act, 21 U.S.C. section 360bbb-3(b)(1), unless the authorization is terminated or revoked sooner.     Influenza A by PCR NEGATIVE NEGATIVE Final   Influenza B by PCR NEGATIVE NEGATIVE Final    Comment: (NOTE) The Xpert Xpress SARS-CoV-2/FLU/RSV plus assay is intended as an aid in the diagnosis of influenza from Nasopharyngeal swab specimens and should not be used as a sole basis for treatment. Nasal washings and aspirates are unacceptable for Xpert Xpress SARS-CoV-2/FLU/RSV testing.  Fact Sheet for Patients: EntrepreneurPulse.com.au  Fact Sheet for Healthcare Providers: IncredibleEmployment.be  This test is not yet approved or cleared by the [place] FDA and has been authorized for detection and/or diagnosis of SARS-CoV-2 by FDA under an Emergency Use Authorization (EUA). This EUA will remain in effect (meaning this test can be used) for the duration of the COVID-19 declaration under Section 564(b)(1) of the Act, 21 U.S.C. section 360bbb-3(b)(1), unless the authorization is terminated or revoked.  Performed at [facility], [address], [place], [place] [address]     Radiology Reports CT HEAD WO CONTRAST  Result Date: 11/11/2020 CLINICAL DATA:  Syncope, delirium. EXAM: CT HEAD WITHOUT CONTRAST TECHNIQUE: Contiguous axial images were obtained from the base of the skull through the vertex without intravenous contrast. COMPARISON:  October 19, 2016. FINDINGS: Brain: No evidence of acute infarction, hemorrhage, hydrocephalus, extra-axial collection or mass lesion/mass effect. Vascular: No hyperdense vessel or unexpected calcification. Skull: Normal. Negative for fracture or focal lesion. Sinuses/Orbits: Pansinusitis is noted. Other: None. IMPRESSION: No acute intracranial abnormality seen.  Pansinusitis is noted. Electronically Signed   By: Marijo Conception M.D.   On: 11/11/2020 12:53   CT Angio Chest Pulmonary Embolism (PE) W or WO Contrast  Result Date: 11/11/2020 CLINICAL DATA:  Syncopal episode. EXAM: CT ANGIOGRAPHY CHEST WITH CONTRAST TECHNIQUE: Multidetector CT imaging of the chest was performed using the standard protocol during bolus administration of intravenous contrast. Multiplanar CT image reconstructions and MIPs were obtained to evaluate the vascular anatomy. CONTRAST:  45mL OMNIPAQUE IOHEXOL 350 MG/ML SOLN COMPARISON:  June 26, 2020 FINDINGS: Cardiovascular: A left-sided venous Port-A-Cath is in place. Satisfactory opacification of the pulmonary arteries to the segmental level. No evidence of pulmonary embolism. Normal heart size with marked severity coronary artery calcification. No pericardial effusion. Mediastinum/Nodes: No enlarged mediastinal, hilar, or axillary lymph nodes. Thyroid gland, trachea, and esophagus demonstrate no significant findings. Lungs/Pleura: Stable areas of scarring and/or atelectasis are seen within the posteromedial aspects of the bilateral apices. Additional stable fibrotic changes are noted within the posterior aspect of the bilateral upper lobes and bilateral lower lobes. A stable 4 mm noncalcified lung nodule is seen within the anterolateral aspect of the right upper lobe (axial CT image 56, CT series 6). Upper Abdomen: No acute abnormality. Musculoskeletal: Multiple chronic compression fracture deformities are seen throughout the mid and lower thoracic spine with evidence of prior vertebroplasty noted. Review of the MIP images confirms the above findings. IMPRESSION: 1. No evidence of pulmonary embolism. 2. Stable areas of scarring and fibrosis without evidence of superimposed acute cardiopulmonary disease. Electronically Signed   By: Virgina Norfolk M.D.   On: 11/11/2020 16:16   DG Chest Port 1 View  Result Date: 11/11/2020 CLINICAL DATA:  Shortness of breath, syncopal episode EXAM: PORTABLE CHEST 1 VIEW  COMPARISON:  Oct 11, 2020. FINDINGS: LEFT-sided Port-A-Cath in situ terminates at the caval to atrial junction. Cardiomediastinal contours and hilar structures are stable. Interstitial prominence in the RIGHT upper chest persists along with septal thickening at the lung bases bilaterally. No lobar consolidation. No pneumothorax.  No pleural effusion. Signs of spinal cement augmentation as before without acute or destructive bony finding on limited assessment. IMPRESSION: Stable interstitial prominence in the RIGHT upper chest along with septal thickening at the lung bases bilaterally. Findings reflect chronic changes and are not associated with lobar consolidation or effusion. Electronically Signed   By: Zetta Bills M.D.   On: 11/11/2020 12:04     CBC Recent Labs  Lab 11/11/20 1125 11/12/20 0618  WBC 7.3 3.2*  HGB 8.8* 7.0*  HCT 28.8* 22.7*  PLT 62* 50*  MCV 85.2 85.7  MCH 26.0 26.4  MCHC 30.6 30.8  RDW 18.6* 18.8*  LYMPHSABS  --  0.6*  MONOABS  --  0.0*  EOSABS  --  0.1  BASOSABS  --  0.0    Chemistries  Recent Labs  Lab 11/11/20 1125 11/12/20 0618  NA 135 137  K 3.5 3.8  CL 103 110  CO2 22 23  GLUCOSE 119* 92  BUN 26* 26*  CREATININE 1.84* 1.50*  CALCIUM 10.1 8.9  MG  --  1.9  AST 19 14*  ALT 16 13  ALKPHOS 65 53  BILITOT 1.4* 0.9   ------------------------------------------------------------------------------------------------------------------ No results for input(s): CHOL, HDL, LDLCALC, TRIG, CHOLHDL, LDLDIRECT in the last 72 hours.  No results found for: HGBA1C ------------------------------------------------------------------------------------------------------------------ No results for input(s): TSH, T4TOTAL, T3FREE, THYROIDAB in the last 72 hours.  Invalid input(s): FREET3 ------------------------------------------------------------------------------------------------------------------ Recent Labs    11/11/20 2019 11/12/20 0619  FERRITIN 559* 474*     Coagulation profile Recent Labs  Lab 11/11/20 1125  INR 1.1    Recent Labs    11/11/20 2019 11/12/20 0618  DDIMER 2.82* 2.70*    Cardiac Enzymes No results for input(s): CKMB, TROPONINI, MYOGLOBIN in the last 168 hours.  Invalid input(s): CK ------------------------------------------------------------------------------------------------------------------    Component Value Date/Time   BNP 68.6 06/16/2019 1949     Rhondalyn Clingan M.D on 11/12/2020 at 2:43 PM  Go to [email or web address] - for contact  info  [facility] - Office  [phone]

## 2020-11-12 NOTE — Progress Notes (Signed)
  Echocardiogram 2D Echocardiogram has been performed.  [name] 11/12/2020, 3:15 PM

## 2020-11-12 NOTE — Evaluation (Signed)
Physical Therapy Evaluation Patient Details Name: [name] MRN: [id number] DOB: 06/18/1929 Today's Date: 11/12/2020   History of Present Illness  [name] is a 87 y.o. male with medical history significant for COPD on 3L, MDS, CKD 3 on 3 L .   Patient was at the outpatient lab have been blood work drawn when he had a syncopal episode.  Patient reports prior dizziness and knew he was going to pass out.  He denies chest pain.  Patient's started having symptoms of weakness, cough, congestion and difficulty breathing and generally feeling unwell- on Friday- 6/24.  He reports poor oral intake.  No vomiting, no loose stools.  No known COVID-positive contacts.  He is vaccinated against COVID X 3.   Clinical Impression  Patient functioning near baseline for functional mobility and gait. Patient does not require assist for bed mobility or transfer to standing. Patient slightly unsteady upon standing but is able to complete standing marches at bedside with unilateral UE support. Patient able to ambulate forward and retro gait with unilateral UE support on IV pole without loss of balance. Patient returns to bed at end of session. Patient discharged to care of nursing for ambulation daily as tolerated for length of stay.     Follow Up Recommendations No PT follow up    Equipment Recommendations  None recommended by PT    Recommendations for Other Services       Precautions / Restrictions Precautions Precautions: Fall Restrictions Weight Bearing Restrictions: No      Mobility  Bed Mobility Overal bed mobility: Independent             General bed mobility comments: transitions to seated EOB with HOB elevated    Transfers Overall transfer level: Independent Equipment used: None             General transfer comment: transfer to standing without AD; minimally unsteady upon standing  Ambulation/Gait Ambulation/Gait assistance: Modified independent (Device/Increase  time) Gait Distance (Feet): 15 Feet Assistive device: IV Pole Gait Pattern/deviations: Decreased stride length;Step-through pattern Gait velocity: decreased   General Gait Details: reaches for IV pole for support with ambulation, able to ambulate forwared and retro steps with unilateral UE support without loss of balance  Stairs            Wheelchair Mobility    Modified Rankin (Stroke Patients Only)       Balance Overall balance assessment: Needs assistance   Sitting balance-Leahy Scale: Normal Sitting balance - Comments: seated EOB   Standing balance support: Single extremity supported Standing balance-Leahy Scale: Good Standing balance comment: good/fair with IV pole                             Pertinent Vitals/Pain Pain Assessment: No/denies pain    Home Living Family/patient expects to be discharged to:: Private residence Living Arrangements: Alone Available Help at Discharge: Family Type of Home: Mobile home       Home Layout: One level Home Equipment: Environmental consultant - 2 wheels;Cane - single point;Electric scooter      Prior Function Level of Independence: Independent with assistive device(s)         Comments: Patient states household ambulator with mostly SPC, scooter for longer distances     Hand Dominance        Extremity/Trunk Assessment   Upper Extremity Assessment Upper Extremity Assessment: Overall WFL for tasks assessed    Lower Extremity Assessment Lower Extremity  Assessment: Overall WFL for tasks assessed    Cervical / Trunk Assessment Cervical / Trunk Assessment: Kyphotic  Communication   Communication: No difficulties  Cognition Arousal/Alertness: Awake/alert Behavior During Therapy: WFL for tasks assessed/performed Overall Cognitive Status: Within Functional Limits for tasks assessed                                        General Comments      Exercises General Exercises - Lower Extremity Hip  Flexion/Marching: AROM;10 reps;Standing;Both   Assessment/Plan    PT Assessment Patent does not need any further PT services  PT Problem List         PT Treatment Interventions      PT Goals (Current goals can be found in the Care Plan section)  Acute Rehab PT Goals Patient Stated Goal: Return home PT Goal Formulation: With patient Time For Goal Achievement: 11/12/20 Potential to Achieve Goals: Good    Frequency     Barriers to discharge        Co-evaluation               AM-PAC PT "6 Clicks" Mobility  Outcome Measure Help needed turning from your back to your side while in a flat bed without using bedrails?: None Help needed moving from lying on your back to sitting on the side of a flat bed without using bedrails?: None Help needed moving to and from a bed to a chair (including a wheelchair)?: None Help needed standing up from a chair using your arms (e.g., wheelchair or bedside chair)?: None Help needed to walk in hospital room?: A Little Help needed climbing 3-5 steps with a railing? : A Little 6 Click Score: 22    End of Session Equipment Utilized During Treatment: Oxygen Activity Tolerance: Patient tolerated treatment well Patient left: in bed;with call bell/phone within reach Nurse Communication: Mobility status PT Visit Diagnosis: Other abnormalities of gait and mobility (R26.89);Unsteadiness on feet (R26.81)    Time: 2263-3354 PT Time Calculation (min) (ACUTE ONLY): 13 min   Charges:   PT Evaluation $PT Eval Low Complexity: 1 Low          9:20 AM, 11/12/20 Mearl Latin PT, DPT Physical Therapist at [facility]

## 2020-11-13 LAB — COMPREHENSIVE METABOLIC PANEL
ALT: 14 U/L (ref 0–44)
AST: 17 U/L (ref 15–41)
Albumin: 2.8 g/dL — ABNORMAL LOW (ref 3.5–5.0)
Alkaline Phosphatase: 57 U/L (ref 38–126)
Anion gap: 6 (ref 5–15)
BUN: 26 mg/dL — ABNORMAL HIGH (ref 8–23)
CO2: 25 mmol/L (ref 22–32)
Calcium: 9 mg/dL (ref 8.9–10.3)
Chloride: 107 mmol/L (ref 98–111)
Creatinine, Ser: 1.59 mg/dL — ABNORMAL HIGH (ref 0.61–1.24)
GFR, Estimated: 41 mL/min — ABNORMAL LOW (ref >60)
Glucose, Bld: 92 mg/dL (ref 70–99)
Potassium: 3.5 mmol/L (ref 3.5–5.1)
Sodium: 138 mmol/L (ref 135–145)
Total Bilirubin: 0.9 mg/dL (ref 0.3–1.2)
Total Protein: 6.2 g/dL — ABNORMAL LOW (ref 6.5–8.1)

## 2020-11-13 LAB — D-DIMER, QUANTITATIVE: D-Dimer, Quant: 2.78 ug/mL-FEU — ABNORMAL HIGH (ref 0.00–0.50)

## 2020-11-13 LAB — CBC WITH DIFFERENTIAL/PLATELET
Basophils Absolute: 0 10*3/uL (ref 0.0–0.1)
Basophils Relative: 0 %
Eosinophils Absolute: 0 10*3/uL (ref 0.0–0.5)
Eosinophils Relative: 0 %
HCT: 26.5 % — ABNORMAL LOW (ref 39.0–52.0)
Hemoglobin: 8.3 g/dL — ABNORMAL LOW (ref 13.0–17.0)
Lymphocytes Relative: 15 %
Lymphs Abs: 0.6 10*3/uL — ABNORMAL LOW (ref 0.7–4.0)
MCH: 26.5 pg (ref 26.0–34.0)
MCHC: 31.3 g/dL (ref 30.0–36.0)
MCV: 84.7 fL (ref 80.0–100.0)
Metamyelocytes Relative: 2 %
Monocytes Absolute: 0.3 10*3/uL (ref 0.1–1.0)
Monocytes Relative: 9 %
Myelocytes: 1 %
Neutro Abs: 2.7 10*3/uL (ref 1.7–7.7)
Neutrophils Relative %: 72 %
Platelets: 51 10*3/uL — ABNORMAL LOW (ref 150–400)
Promyelocytes Relative: 1 %
RBC: 3.13 MIL/uL — ABNORMAL LOW (ref 4.22–5.81)
RDW: 18.3 % — ABNORMAL HIGH (ref 11.5–15.5)
WBC: 3.7 10*3/uL — ABNORMAL LOW (ref 4.0–10.5)
nRBC: 0 % (ref 0.0–0.2)

## 2020-11-13 LAB — BPAM RBC
Blood Product Expiration Date: 202207292359
ISSUE DATE / TIME: 202206281818
Unit Type and Rh: 1700

## 2020-11-13 LAB — TYPE AND SCREEN
ABO/RH(D): B NEG
Antibody Screen: NEGATIVE
Unit division: 0

## 2020-11-13 LAB — PHOSPHORUS: Phosphorus: 3.3 mg/dL (ref 2.5–4.6)

## 2020-11-13 LAB — FERRITIN: Ferritin: 523 ng/mL — ABNORMAL HIGH (ref 24–336)

## 2020-11-13 LAB — C-REACTIVE PROTEIN: CRP: 7.4 mg/dL — ABNORMAL HIGH (ref <1.0)

## 2020-11-13 LAB — MAGNESIUM: Magnesium: 1.8 mg/dL (ref 1.7–2.4)

## 2020-11-13 MED ORDER — PROCRIT 10000 UNIT/ML IJ SOLN: 10000.0000 [IU] | INTRAMUSCULAR | 5 refills | Status: DC

## 2020-11-13 NOTE — Clinical Social Work Note (Signed)
Spoke with daughter regarding [facility] services. No preferences. Referral made to [facility] for RN, PT, aide.    Donterrius Santucci, Clydene Pugh, LCSW

## 2020-11-13 NOTE — Progress Notes (Signed)
Pt discharged home in stable condition with all personal belongings. Pt verbalizes understanding of all discharge instructions.

## 2020-11-13 NOTE — Discharge Summary (Signed)
[name], is a 87 y.o. male  DOB February 10, 1930  MRN [id number].  Admission date:  11/11/2020  Admitting Physician  Roxan Hockey, MD  Discharge Date:  11/13/2020   Primary MD  Sharilyn Sites, MD  Recommendations for primary care physician for things to follow:   1) You are strongly advised to isolate/quarantine for at least 10 days from the date of your diagnosis with COVID-19 infection--please always wear a mask if you have to go outside the house  2)Please take medications as prescribed  3)Please follow-up with Dr. Delton Coombes the week of July 11 for repeat CBC and possible transfusion  4)Avoid ibuprofen/Advil/Aleve/Motrin/Goody Powders/Naproxen/BC powders/Meloxicam/Diclofenac/Indomethacin and other Nonsteroidal anti-inflammatory medications as these will make you more likely to bleed and can cause stomach ulcers, can also cause Kidney problems.    Admission Diagnosis  Syncope and collapse [R55] Syncope [R55] COVID-19 virus infection [U07.1]   Discharge Diagnosis  Syncope and collapse [R55] Syncope [R55] COVID-19 virus infection [U07.1]    Principal Problem:   Syncope Active Problems:   Myelodysplastic syndrome (HCC)   COVID-19 virus infection   Chronic respiratory failure with hypoxia /// 3L/min at baseline   Thrombocytopenia (HCC)   COPD (chronic obstructive pulmonary disease) (HCC)   CKD (chronic kidney disease), stage IIIB      Past Medical History:  Diagnosis Date   Bone cancer ([place])    Chronic kidney insufficiency    COPD (chronic obstructive pulmonary disease) (HCC)    GERD (gastroesophageal reflux disease)    Pulmonary nodule     Past Surgical History:  Procedure Laterality Date   BACK SURGERY     CATARACT EXTRACTION W/PHACO Right 03/09/2016   Procedure: CATARACT EXTRACTION PHACO AND INTRAOCULAR LENS PLACEMENT RIGHT EYE CDE=8.58;  Surgeon: Tonny Branch, MD;  Location: [facility];  Service: Ophthalmology;  Laterality: Right;  right   CATARACT EXTRACTION W/PHACO Left 04/13/2016   Procedure: CATARACT EXTRACTION PHACO AND INTRAOCULAR LENS PLACEMENT (IOC);  Surgeon: Tonny Branch, MD;  Location: [facility];  Service: Ophthalmology;  Laterality: Left;  CDE: 8.08   KYPHOPLASTY Bilateral 06/09/2019   Procedure: T11 KYPHOPLASTY;  Surgeon: Consuella Lose, MD;  Location: [facility];  Service: Neurosurgery;  Laterality: Bilateral;   None to Date  10/01/15   PORTACATH PLACEMENT Left 10/11/2020   Procedure: INSERTION PORT-A-CATH (attached catheter in left internal jugular);  Surgeon: Virl Cagey, MD;  Location: [facility];  Service: General;  Laterality: Left;     HPI  from the history and physical done on the day of admission:    Chief Complaint: Weakness, SOB, Cough   HPI: [name] is a 87 y.o. male with medical history significant for COPD on 3L, MDS, CKD 3 on 3 L .  Patient was at the outpatient lab have been blood work drawn when he had a syncopal episode.  Patient reports prior dizziness and knew he was going to pass out.  He denies chest pain.  Patient's started having symptoms of weakness, cough, congestion and difficulty breathing and generally  feeling unwell- on Friday- 6/24.  He reports poor oral intake.  No vomiting, no loose stools.  No known COVID-positive contacts.  He is vaccinated against COVID X 3.   On arrival to the ED, patient initially was confused.  But on my evaluation patient is awake alert oriented x4 and able to give me a detailed history.   ED Course: Temperature 97.9.  Heart rate 80s to 90s.  Respiratory rate 16-23.  Blood pressure systolic 84-696.  O2 sats Greater than 94% on home 3 L. COVID test positive.  Platelets low 62-about baseline.  Potassium 3.5.  Troponin 9x2.  Lactic acid 2.3.  CTA no PE shows scarring or fibrosis.  Head CT-shows pansinusitis without acute intracranial abnormality.  EKG shows sinus rhythm without significant  abnormalities. 500 mill bolus given.  Hospitalist to admit for syncope, was also unsteady on his feet.    Hospital Course:     Brief Summary:- 87 y.o. male with medical history significant for COPD on 3L, MDS, CKD 3 on 3 L  who was admitted with Covid Infection on 11/11/20 (onset of symptoms 11/08/2020 onset of sxs 11/08/20), patient is vaccinated x3   A/p 1)COVID-positive infection-  hypoxia is back to baseline ([place] at 3L/min) ,  -Respiratory symptoms have improved  CTA chest without acute abnormality,   Vaccinated X 3.  -patient does not meet criteria for Paxlovid --Patient received monoclonal antibody Bebtelovimab Treated with IV remdesivir x300 mg total -Fatigue, malaise, myalgias congestion improving  2)COPD with chronic respiratory failure-stable.  On home 3 L. --Continue bronchodilators and mucolytics as per the splint oxygen -Oxygen requirement is back to baseline at this time   3)Myelodysplastic syndrome on chemotherapy- with Port-A-Cath right upper chest.  Follows with Dr. Delton Coombes.  On active chemotherapy.      4)Thrombocytopenia and  Anemia--- in the setting of underlying MDS as above #3 compounded by chemotherapy - --patient sees Dr. Delton Coombes  Platelets 62>>50 Hgb 8.8 >>7.0>>8.3 (received 1 unit of PRBC on 11/12/2020) -No evidence of ongoing bleeding,  -  4)CKD 3B- creatinine 1.84 >> 1.59 --recent baseline 1.6-1.7.  renally adjust medications, avoid nephrotoxic agents / dehydration  / hypotension   --5)Syncope- on telemetry monitored unit no significant arrhythmias,   -EKG sinus rhythm with PVCs  --echocardiogram with preserved EF of 55 to 60% , w/o significant aortic stenosis or other outflow obstruction, and without segmental/Regional wall motion abnormalities.   -CT head is negative for acute findings   6)Social/Ethics--spoke with daughter Ms Lahoma Rocker at [phone]----she is patient's HCPOA, she plans to travel here over the next day or 2 to be with  patient, she has significantly recovering from COVID-19 infection which she contracted around October 21, 2020 -discharge home daughter can make arrangements for somebody to be with patient, daughter is driving here within the 24 hours or so   Disposition/--Home   Disposition: The patient is from: Home              Anticipated d/c is to: Home                Code Status :  -  Code Status: Full Code    Family Communication:      (patient is alert, awake and coherent) -spoke with daughter Ms Lahoma Rocker at [phone]----she is patient's HCPOA    Discharge Condition: stable  Follow UP--- Dr. Delton Coombes for repeat CBC and possible transfusion as well as Procrit injections   Consults obtained - na  Diet and Activity  recommendation:  As advised  Discharge Instructions    Discharge Instructions     Call MD for:  persistant dizziness or light-headedness   Complete by: As directed    Call MD for:  persistant nausea and vomiting   Complete by: As directed    Call MD for:  severe uncontrolled pain   Complete by: As directed    Call MD for:  temperature >100.4   Complete by: As directed    Diet general   Complete by: As directed    Discharge instructions   Complete by: As directed    1) You are strongly advised to isolate/quarantine for at least 10 days from the date of your diagnosis with COVID-19 infection--please always wear a mask if you have to go outside the house  2)Please take medications as prescribed  3)Please follow-up with Dr. Delton Coombes the week of July 11 for repeat CBC and possible transfusion  4)Avoid ibuprofen/Advil/Aleve/Motrin/Goody Powders/Naproxen/BC powders/Meloxicam/Diclofenac/Indomethacin and other Nonsteroidal anti-inflammatory medications as these will make you more likely to bleed and can cause stomach ulcers, can also cause Kidney problems.   Increase activity slowly   Complete by: As directed          Discharge Medications     Allergies as of  11/13/2020   No Known Allergies      Medication List     STOP taking these medications    azaCITIDine 5 mg/2 mLs in lactated ringers infusion   doxycycline 100 MG tablet Commonly known as: VIBRA-TABS       TAKE these medications    acetaminophen 325 MG tablet Commonly known as: TYLENOL Take 2 tablets (650 mg total) by mouth every 6 (six) hours as needed for mild pain, fever or headache (or Fever >/= 101).   albuterol 108 (90 Base) MCG/ACT inhaler Commonly known as: VENTOLIN HFA Inhale 2 puffs into the lungs every 4 (four) hours as needed for wheezing or shortness of breath.   dronabinol 2.5 MG capsule Commonly known as: MARINOL Take 1 capsule (2.5 mg total) by mouth 2 (two) times daily before a meal.   prochlorperazine 10 MG tablet Commonly known as: COMPAZINE Take 1 tablet (10 mg total) by mouth every 6 (six) hours as needed for nausea or vomiting.   Procrit 10000 UNIT/ML injection Generic drug: epoetin alfa Inject 1 mL (10,000 Units total) into the skin every Tuesday. Start taking on: November 19, 2020 What changed: when to take this   Spiriva HandiHaler 18 MCG inhalation capsule Generic drug: tiotropium Place 1 capsule (18 mcg total) into inhaler and inhale daily.       ASK your doctor about these medications    ondansetron 4 MG tablet Commonly known as: ZOFRAN Take 1 tablet (4 mg total) by mouth every 6 (six) hours as needed for nausea or vomiting.   pantoprazole 40 MG tablet Commonly known as: PROTONIX Take 40 mg by mouth daily. Pt no longer taking   tamsulosin 0.4 MG Caps capsule Commonly known as: FLOMAX Take 1 capsule (0.4 mg total) by mouth daily after supper.        Major procedures and Radiology Reports - PLEASE review detailed and final reports for all details, in brief -    CT HEAD WO CONTRAST  Result Date: 11/11/2020 CLINICAL DATA:  Syncope, delirium. EXAM: CT HEAD WITHOUT CONTRAST TECHNIQUE: Contiguous axial images were obtained from  the base of the skull through the vertex without intravenous contrast. COMPARISON:  October 19, 2016. FINDINGS: Brain: No evidence  of acute infarction, hemorrhage, hydrocephalus, extra-axial collection or mass lesion/mass effect. Vascular: No hyperdense vessel or unexpected calcification. Skull: Normal. Negative for fracture or focal lesion. Sinuses/Orbits: Pansinusitis is noted. Other: None. IMPRESSION: No acute intracranial abnormality seen.  Pansinusitis is noted. Electronically Signed   By: Marijo Conception M.D.   On: 11/11/2020 12:53   CT Angio Chest Pulmonary Embolism (PE) W or WO Contrast  Result Date: 11/11/2020 CLINICAL DATA:  Syncopal episode. EXAM: CT ANGIOGRAPHY CHEST WITH CONTRAST TECHNIQUE: Multidetector CT imaging of the chest was performed using the standard protocol during bolus administration of intravenous contrast. Multiplanar CT image reconstructions and MIPs were obtained to evaluate the vascular anatomy. CONTRAST:  42mL OMNIPAQUE IOHEXOL 350 MG/ML SOLN COMPARISON:  June 26, 2020 FINDINGS: Cardiovascular: A left-sided venous Port-A-Cath is in place. Satisfactory opacification of the pulmonary arteries to the segmental level. No evidence of pulmonary embolism. Normal heart size with marked severity coronary artery calcification. No pericardial effusion. Mediastinum/Nodes: No enlarged mediastinal, hilar, or axillary lymph nodes. Thyroid gland, trachea, and esophagus demonstrate no significant findings. Lungs/Pleura: Stable areas of scarring and/or atelectasis are seen within the posteromedial aspects of the bilateral apices. Additional stable fibrotic changes are noted within the posterior aspect of the bilateral upper lobes and bilateral lower lobes. A stable 4 mm noncalcified lung nodule is seen within the anterolateral aspect of the right upper lobe (axial CT image 56, CT series 6). Upper Abdomen: No acute abnormality. Musculoskeletal: Multiple chronic compression fracture deformities are  seen throughout the mid and lower thoracic spine with evidence of prior vertebroplasty noted. Review of the MIP images confirms the above findings. IMPRESSION: 1. No evidence of pulmonary embolism. 2. Stable areas of scarring and fibrosis without evidence of superimposed acute cardiopulmonary disease. Electronically Signed   By: Virgina Norfolk M.D.   On: 11/11/2020 16:16   DG Chest Port 1 View  Result Date: 11/11/2020 CLINICAL DATA:  Shortness of breath, syncopal episode EXAM: PORTABLE CHEST 1 VIEW COMPARISON:  Oct 11, 2020. FINDINGS: LEFT-sided Port-A-Cath in situ terminates at the caval to atrial junction. Cardiomediastinal contours and hilar structures are stable. Interstitial prominence in the RIGHT upper chest persists along with septal thickening at the lung bases bilaterally. No lobar consolidation. No pneumothorax.  No pleural effusion. Signs of spinal cement augmentation as before without acute or destructive bony finding on limited assessment. IMPRESSION: Stable interstitial prominence in the RIGHT upper chest along with septal thickening at the lung bases bilaterally. Findings reflect chronic changes and are not associated with lobar consolidation or effusion. Electronically Signed   By: Zetta Bills M.D.   On: 11/11/2020 12:04   ECHOCARDIOGRAM COMPLETE  Result Date: 11/12/2020    ECHOCARDIOGRAM REPORT   Patient Name:   YADIEL AUBRY Date of Exam: 11/12/2020 Medical Rec #:  [id number]        Height:       76.0 in Accession #:    [id number]       Weight:       133.0 lb Date of Birth:  09-16-1929        BSA:          1.863 m Patient Age:    4 years         BP:           101/59 mmHg Patient Gender: M                HR:           78  bpm. Exam Location:  [facility] Procedure: 2D Echo, Cardiac Doppler and Color Doppler Indications:    Syncope  History:        Patient has no prior history of Echocardiogram examinations.                 COPD; Signs/Symptoms:Resp. failure,Covid+, CKD. Chemo,                  Myelodysplastic syndrome.  Sonographer:    Dustin Flock RDCS Referring Phys: [phone] Leanne Chang [facility]  Sonographer Comments: Image acquisition challenging due to COPD and Image acquisition challenging due to respiratory motion. IMPRESSIONS  1. Left ventricular ejection fraction, by estimation, is 55 to 60%. The left ventricle has normal function. Left ventricular endocardial border not optimally defined to evaluate regional wall motion. Left ventricular diastolic parameters are consistent with Grade I diastolic dysfunction (impaired relaxation).  2. Right ventricular systolic function is normal. The right ventricular size is normal.  3. The mitral valve is normal in structure. No evidence of mitral valve regurgitation. No evidence of mitral stenosis.  4. The aortic valve has an indeterminant number of cusps. There is moderate calcification of the aortic valve. There is moderate thickening of the aortic valve. Aortic valve regurgitation is not visualized. No aortic stenosis is present. FINDINGS  Left Ventricle: Left ventricular ejection fraction, by estimation, is 55 to 60%. The left ventricle has normal function. Left ventricular endocardial border not optimally defined to evaluate regional wall motion. The left ventricular internal cavity size was normal in size. There is no left ventricular hypertrophy. Left ventricular diastolic parameters are consistent with Grade I diastolic dysfunction (impaired relaxation). Normal left ventricular filling pressure. Right Ventricle: The right ventricular size is normal. Right vetricular wall thickness was not assessed. Right ventricular systolic function is normal. Left Atrium: Left atrial size was normal in size. Right Atrium: Right atrial size was normal in size. Pericardium: There is no evidence of pericardial effusion. Mitral Valve: The mitral valve is normal in structure. There is mild thickening of the mitral valve leaflet(s). There is mild calcification of  the mitral valve leaflet(s). Mild to moderate mitral annular calcification. No evidence of mitral valve regurgitation. No evidence of mitral valve stenosis. Tricuspid Valve: The tricuspid valve is normal in structure. Tricuspid valve regurgitation is not demonstrated. No evidence of tricuspid stenosis. Aortic Valve: The aortic valve has an indeterminant number of cusps. There is moderate calcification of the aortic valve. There is moderate thickening of the aortic valve. There is moderate aortic valve annular calcification. Aortic valve regurgitation is not visualized. No aortic stenosis is present. Pulmonic Valve: The pulmonic valve was not well visualized. Pulmonic valve regurgitation is not visualized. No evidence of pulmonic stenosis. Aorta: The aortic root is normal in size and structure. Pulmonary Artery: Indeterminant PASP, inadequate TR jet. Venous: The inferior vena cava was not well visualized. IAS/Shunts: The interatrial septum was not well visualized.  LEFT VENTRICLE PLAX 2D LVIDd:         3.83 cm  Diastology LVIDs:         2.77 cm  LV e' medial:    5.26 cm/s LV PW:         0.99 cm  LV E/e' medial:  12.3 LV IVS:        0.98 cm  LV e' lateral:   11.60 cm/s LVOT diam:     2.30 cm  LV E/e' lateral: 5.6 LV SV:         91  LV SV Index:   49 LVOT Area:     4.15 cm  RIGHT VENTRICLE RV Basal diam:  2.99 cm RV S prime:     11.40 cm/s TAPSE (M-mode): 1.6 cm LEFT ATRIUM           Index       RIGHT ATRIUM           Index LA diam:      3.00 cm 1.61 cm/m  RA Area:     14.00 cm LA Vol (A2C): 17.5 ml 9.40 ml/m  RA Volume:   37.00 ml  19.86 ml/m LA Vol (A4C): 57.7 ml 30.98 ml/m  AORTIC VALVE LVOT Vmax:   113.00 cm/s LVOT Vmean:  76.000 cm/s LVOT VTI:    0.218 m  AORTA Ao Root diam: 3.40 cm MITRAL VALVE MV Area (PHT): 3.58 cm    SHUNTS MV Decel Time: 212 msec    Systemic VTI:  0.22 m MV E velocity: 64.90 cm/s  Systemic Diam: 2.30 cm MV A velocity: 69.80 cm/s MV E/A ratio:  0.93 Carlyle Dolly MD Electronically  signed by Carlyle Dolly MD Signature Date/Time: 11/12/2020/3:42:31 PM    Final     Micro Results   Recent Results (from the past 240 hour(s))  Resp Panel by RT-PCR (Flu A&B, Covid) Nasopharyngeal Swab     Status: Abnormal   Collection Time: 11/11/20 11:51 AM   Specimen: Nasopharyngeal Swab; Nasopharyngeal(NP) swabs in vial transport medium  Result Value Ref Range Status   SARS Coronavirus 2 by RT PCR POSITIVE (A) NEGATIVE Final    Comment: RESULT CALLED TO, READ BACK BY AND VERIFIED WITH: BRANDY OAKLEY,RN @1321  11/11/2020 KAY (NOTE) SARS-CoV-2 target nucleic acids are DETECTED.  The SARS-CoV-2 RNA is generally detectable in upper respiratory specimens during the acute phase of infection. Positive results are indicative of the presence of the identified virus, but do not rule out bacterial infection or co-infection with other pathogens not detected by the test. Clinical correlation with patient history and other diagnostic information is necessary to determine patient infection status. The expected result is Negative.  Fact Sheet for Patients: EntrepreneurPulse.com.au  Fact Sheet for Healthcare Providers: IncredibleEmployment.be  This test is not yet approved or cleared by the [place] FDA and  has been authorized for detection and/or diagnosis of SARS-CoV-2 by FDA under an Emergency Use Authorization (EUA).  This EUA will remain in effect (meaning this test ca n be used) for the duration of  the COVID-19 declaration under Section 564(b)(1) of the Act, 21 U.S.C. section 360bbb-3(b)(1), unless the authorization is terminated or revoked sooner.     Influenza A by PCR NEGATIVE NEGATIVE Final   Influenza B by PCR NEGATIVE NEGATIVE Final    Comment: (NOTE) The Xpert Xpress SARS-CoV-2/FLU/RSV plus assay is intended as an aid in the diagnosis of influenza from Nasopharyngeal swab specimens and should not be used as a sole basis for  treatment. Nasal washings and aspirates are unacceptable for Xpert Xpress SARS-CoV-2/FLU/RSV testing.  Fact Sheet for Patients: EntrepreneurPulse.com.au  Fact Sheet for Healthcare Providers: IncredibleEmployment.be  This test is not yet approved or cleared by the [place] FDA and has been authorized for detection and/or diagnosis of SARS-CoV-2 by FDA under an Emergency Use Authorization (EUA). This EUA will remain in effect (meaning this test can be used) for the duration of the COVID-19 declaration under Section 564(b)(1) of the Act, 21 U.S.C. section 360bbb-3(b)(1), unless the authorization is terminated or revoked.  Performed at [facility]  [facility], [address], [place], [place] [address]        Today   Subjective    Colin Ellers today has no new complaints No fever  Or chills   No Nausea, Vomiting or Diarrhea       Patient has been seen and examined prior to discharge   Objective   Blood pressure 112/69, pulse 79, temperature 98 F (36.7 C), temperature source Oral, resp. rate 18, weight 60.3 kg, SpO2 99 %.   Intake/Output Summary (Last 24 hours) at 11/13/2020 1450 Last data filed at 11/13/2020 0500 Gross per 24 hour  Intake 3123.97 ml  Output 1600 ml  Net 1523.97 ml   Exam Gen:- Awake Alert, frail and elderly appearing HEENT:- [place].AT, No sclera icterus Nose- [place] 3L/min Neck-Supple Neck,No JVD,. Lungs-improved air movement ,no wheezing no rhonchi or rales  CV- S1, S2 normal, regular , left-sided Port-A-Cath in situ Abd-  +ve B.Sounds, Abd Soft, No tenderness,    Extremity/Skin:- No  edema, pedal pulses present Psych-affect is appropriate, oriented x3 Neuro-generalized weakness, no new focal deficits, no tremors   Data Review   CBC w Diff:  Lab Results  Component Value Date   WBC 3.7 (L) 11/13/2020   HGB 8.3 (L) 11/13/2020   HCT 26.5 (L) 11/13/2020   PLT 51 (L) 11/13/2020   LYMPHOPCT 15 11/13/2020   MONOPCT 9  11/13/2020   EOSPCT 0 11/13/2020   BASOPCT 0 11/13/2020    CMP:  Lab Results  Component Value Date   NA 138 11/13/2020   K 3.5 11/13/2020   CL 107 11/13/2020   CO2 25 11/13/2020   BUN 26 (H) 11/13/2020   CREATININE 1.59 (H) 11/13/2020   PROT 6.2 (L) 11/13/2020   ALBUMIN 2.8 (L) 11/13/2020   BILITOT 0.9 11/13/2020   ALKPHOS 57 11/13/2020   AST 17 11/13/2020   ALT 14 11/13/2020  .   Total Discharge time is about 33 minutes  Roxan Hockey M.D on 11/13/2020 at 2:50 PM  Go to [email or web address] -  for contact info  [facility] - Office  [phone]

## 2020-11-13 NOTE — Discharge Instructions (Signed)
1) You are strongly advised to isolate/quarantine for at least 10 days from the date of your diagnosis with COVID-19 infection--please always wear a mask if you have to go outside the house  2)Please take medications as prescribed  3)Please follow-up with Dr. Delton Coombes the week of July 11 for repeat CBC and possible transfusion  4)Avoid ibuprofen/Advil/Aleve/Motrin/Goody Powders/Naproxen/BC powders/Meloxicam/Diclofenac/Indomethacin and other Nonsteroidal anti-inflammatory medications as these will make you more likely to bleed and can cause stomach ulcers, can also cause Kidney problems.

## 2020-11-14 DIAGNOSIS — Z9981 Dependence on supplemental oxygen: Secondary | ICD-10-CM | POA: Diagnosis not present

## 2020-11-14 DIAGNOSIS — R55 Syncope and collapse: Secondary | ICD-10-CM | POA: Diagnosis not present

## 2020-11-14 DIAGNOSIS — J9611 Chronic respiratory failure with hypoxia: Secondary | ICD-10-CM | POA: Diagnosis not present

## 2020-11-14 DIAGNOSIS — R911 Solitary pulmonary nodule: Secondary | ICD-10-CM | POA: Diagnosis not present

## 2020-11-14 DIAGNOSIS — J449 Chronic obstructive pulmonary disease, unspecified: Secondary | ICD-10-CM | POA: Diagnosis not present

## 2020-11-14 DIAGNOSIS — N1832 Chronic kidney disease, stage 3b: Secondary | ICD-10-CM | POA: Diagnosis not present

## 2020-11-14 DIAGNOSIS — D469 Myelodysplastic syndrome, unspecified: Secondary | ICD-10-CM | POA: Diagnosis not present

## 2020-11-14 DIAGNOSIS — K219 Gastro-esophageal reflux disease without esophagitis: Secondary | ICD-10-CM | POA: Diagnosis not present

## 2020-11-14 DIAGNOSIS — U071 COVID-19: Secondary | ICD-10-CM | POA: Diagnosis not present

## 2020-11-14 DIAGNOSIS — Z7952 Long term (current) use of systemic steroids: Secondary | ICD-10-CM | POA: Diagnosis not present

## 2020-11-14 DIAGNOSIS — D630 Anemia in neoplastic disease: Secondary | ICD-10-CM | POA: Diagnosis not present

## 2020-11-14 DIAGNOSIS — D696 Thrombocytopenia, unspecified: Secondary | ICD-10-CM | POA: Diagnosis not present

## 2020-11-14 DIAGNOSIS — Z87891 Personal history of nicotine dependence: Secondary | ICD-10-CM | POA: Diagnosis not present

## 2020-11-14 DIAGNOSIS — C419 Malignant neoplasm of bone and articular cartilage, unspecified: Secondary | ICD-10-CM | POA: Diagnosis not present

## 2020-11-18 NOTE — Progress Notes (Signed)
[address] [place], [place] [address]   CLINIC:  Medical Oncology/Hematology  PCP:  Sharilyn Sites, [address] / [place] [address] [phone]   REASON FOR VISIT:  Follow-up for MDS  PRIOR THERAPY: Aranesp, Retacrit, intermittent PRBC transfusions  NGS Results: not done  CURRENT THERAPY: Azacitidine (initiated on 09/16/2020)  BRIEF ONCOLOGIC HISTORY:  Oncology History  Myelodysplastic syndrome ([facility])  09/16/2020 -  Chemotherapy    Patient is on Treatment Plan: MYELODYSPLASIA  AZACITIDINE IV D1-7 Q28D        CANCER STAGING: Cancer Staging No matching staging information was found for the patient.  INTERVAL HISTORY:  [name], a 87 y.o. male, returns for routine follow-up of his MDS. Kalijah was last seen on 10/06/2020 by Dr. Raliegh Ip.   Today he reports he has recovered well from his hospitalization and COVID infection.  He reports he is currently at his baseline level of 3 L of oxygen.  He reports that his energy is "always low".  And that walking is out of the question even for a short distance.  He notes that he has to force himself to eat as his appetite is quite poor.  His weight is decreased down to 127 pounds down from 137 pounds in May 2022.  He tries to eat but it is "not working".  He denies having any issues with nausea or diarrhea.  He has no bleeding but does have some bruising on his arms.  He otherwise denies any fevers, chills, sweats, nausea, or diarrhea.  Full 10 point ROS is listed below.  He is willing and able to proceed with treatment today.  REVIEW OF SYSTEMS:  Review of Systems  Constitutional:  Positive for appetite change (reduced) and fatigue (5/10).  HENT:   Negative for nosebleeds.   Respiratory:  Negative for cough.   Cardiovascular:  Negative for chest pain.  Gastrointestinal:  Positive for abdominal pain (stomach), constipation and diarrhea. Negative for blood in stool.  All other systems  reviewed and are negative.  PAST MEDICAL/SURGICAL HISTORY:  Past Medical History:  Diagnosis Date   Bone cancer ([facility])    Chronic kidney insufficiency    COPD (chronic obstructive pulmonary disease) ([facility])    GERD (gastroesophageal reflux disease)    Pulmonary nodule    Past Surgical History:  Procedure Laterality Date   BACK SURGERY     CATARACT EXTRACTION W/PHACO Right 03/09/2016   Procedure: CATARACT EXTRACTION PHACO AND INTRAOCULAR LENS PLACEMENT RIGHT EYE CDE=8.58;  Surgeon: Tonny Branch, MD;  Location: [facility];  Service: Ophthalmology;  Laterality: Right;  right   CATARACT EXTRACTION W/PHACO Left 04/13/2016   Procedure: CATARACT EXTRACTION PHACO AND INTRAOCULAR LENS PLACEMENT (IOC);  Surgeon: Tonny Branch, MD;  Location: [facility];  Service: Ophthalmology;  Laterality: Left;  CDE: 8.08   KYPHOPLASTY Bilateral 06/09/2019   Procedure: T11 KYPHOPLASTY;  Surgeon: Consuella Lose, MD;  Location: [facility];  Service: Neurosurgery;  Laterality: Bilateral;   None to Date  10/01/15   PORTACATH PLACEMENT Left 10/11/2020   Procedure: INSERTION PORT-A-CATH (attached catheter in left internal jugular);  Surgeon: Virl Cagey, MD;  Location: [facility];  Service: General;  Laterality: Left;    SOCIAL HISTORY:  Social History   Socioeconomic History   Marital status: Widowed    Spouse name: Not on file   Number of children: Not on file   Years of education: Not on file   Highest education level: Not on file  Occupational History   Not on file  Tobacco Use   Smoking status: Former    Years: 15.00    Pack years: 0.00    Types: Cigarettes, Cigars    Quit date: 10/01/1990    Years since quitting: 30.1   Smokeless tobacco: Never   Tobacco comments:    Quit x 25-30 years; 2 cigars daily when smoked  Vaping Use   Vaping Use: Never used  Substance and Sexual Activity   Alcohol use: Not Currently    Alcohol/week: 0.0 standard drinks   Drug use: No   Sexual activity: Never    Birth  control/protection: None  Other Topics Concern   Not on file  Social History Narrative   Not on file   Social Determinants of Health   Financial Resource Strain: High Risk   Difficulty of Paying Living Expenses: Hard  Food Insecurity: No Food Insecurity   Worried About Running Out of Food in the Last Year: Never true   Ran Out of Food in the Last Year: Never true  Transportation Needs: No Transportation Needs   Lack of Transportation (Medical): No   Lack of Transportation (Non-Medical): No  Physical Activity: Inactive   Days of Exercise per Week: 0 days   Minutes of Exercise per Session: 0 min  Stress: No Stress Concern Present   Feeling of Stress : Not at all  Social Connections: Moderately Integrated   Frequency of Communication with Friends and Family: More than three times a week   Frequency of Social Gatherings with Friends and Family: More than three times a week   Attends Religious Services: More than 4 times per year   Active Member of [employer] or Organizations: Yes   Attends Archivist Meetings: More than 4 times per year   Marital Status: Widowed  Human resources officer Violence: Not At Risk   Fear of Current or Ex-Partner: No   Emotionally Abused: No   Physically Abused: No   Sexually Abused: No    FAMILY HISTORY:  Family History  Problem Relation Age of Onset   Kidney disease Mother    Kidney disease Sister    Colon cancer Neg Hx    Gastric cancer Neg Hx    Esophageal cancer Neg Hx     CURRENT MEDICATIONS:  Current Outpatient Medications  Medication Sig Dispense Refill   acetaminophen (TYLENOL) 325 MG tablet Take 2 tablets (650 mg total) by mouth every 6 (six) hours as needed for mild pain, fever or headache (or Fever >/= 101). 30 tablet 2   albuterol (VENTOLIN HFA) 108 (90 Base) MCG/ACT inhaler Inhale 2 puffs into the lungs every 4 (four) hours as needed for wheezing or shortness of breath. 18 g 2   dronabinol (MARINOL) 2.5 MG capsule Take 1 capsule  (2.5 mg total) by mouth 2 (two) times daily before a meal. 60 capsule 2   [START ON 11/19/2020] epoetin alfa (PROCRIT) 16109 UNIT/ML injection Inject 1 mL (10,000 Units total) into the skin every Tuesday. 1 mL 5   ondansetron (ZOFRAN) 4 MG tablet Take 1 tablet (4 mg total) by mouth every 6 (six) hours as needed for nausea or vomiting. (Patient not taking: No sig reported) 12 tablet 0   pantoprazole (PROTONIX) 40 MG tablet Take 40 mg by mouth daily. Pt no longer taking (Patient not taking: Reported on 11/11/2020)     prochlorperazine (COMPAZINE) 10 MG tablet Take 1 tablet (10 mg total) by mouth every 6 (six) hours as needed for  nausea or vomiting. 30 tablet 3   tamsulosin (FLOMAX) 0.4 MG CAPS capsule Take 1 capsule (0.4 mg total) by mouth daily after supper. (Patient not taking: Reported on 11/11/2020) 30 capsule 2   tiotropium (SPIRIVA HANDIHALER) 18 MCG inhalation capsule Place 1 capsule (18 mcg total) into inhaler and inhale daily. 30 capsule 12   No current facility-administered medications for this visit.    ALLERGIES:  No Known Allergies  PHYSICAL EXAM:  Performance status (ECOG): 2 - Symptomatic, <50% confined to bed  There were no vitals filed for this visit. Wt Readings from Last 3 Encounters:  11/11/20 133 lb (60.3 kg)  10/23/20 133 lb 12.8 oz (60.7 kg)  10/21/20 131 lb (59.4 kg)   Physical Exam Vitals reviewed.  Constitutional:      Appearance: Normal appearance.  Cardiovascular:     Rate and Rhythm: Normal rate and regular rhythm.     Pulses: Normal pulses.     Heart sounds: Normal heart sounds.  Pulmonary:     Effort: Pulmonary effort is normal.     Breath sounds: Normal breath sounds.  Musculoskeletal:     Right lower leg: Edema (trace) present.     Left lower leg: No edema.  Neurological:     General: No focal deficit present.     Mental Status: He is alert and oriented to person, place, and time.  Psychiatric:        Mood and Affect: Mood normal.        Behavior:  Behavior normal.     LABORATORY DATA:  I have reviewed the labs as listed.  CBC Latest Ref Rng & Units 11/13/2020 11/12/2020 11/11/2020  WBC 4.0 - 10.5 K/uL 3.7(L) 3.2(L) 7.3  Hemoglobin 13.0 - 17.0 g/dL 8.3(L) 7.0(L) 8.8(L)  Hematocrit 39.0 - 52.0 % 26.5(L) 22.7(L) 28.8(L)  Platelets 150 - 400 K/uL 51(L) 50(L) 62(L)   CMP Latest Ref Rng & Units 11/13/2020 11/12/2020 11/11/2020  Glucose 70 - 99 mg/dL 92 92 119(H)  BUN 8 - 23 mg/dL 26(H) 26(H) 26(H)  Creatinine 0.61 - 1.24 mg/dL 1.59(H) 1.50(H) 1.84(H)  Sodium 135 - 145 mmol/L 138 137 135  Potassium 3.5 - 5.1 mmol/L 3.5 3.8 3.5  Chloride 98 - 111 mmol/L 107 110 103  CO2 22 - 32 mmol/L _0 Calcium 8.9 - 10.3 mg/dL 9.0 8.9 10.1  Total Protein 6.5 - 8.1 g/dL 6.2(L) 5.6(L) 6.9  Total Bilirubin 0.3 - 1.2 mg/dL 0.9 0.9 1.4(H)  Alkaline Phos 38 - 126 U/L 57 53 65  AST 15 - 41 U/L 17 14(L) 19  ALT 0 - 44 U/L _1 DIAGNOSTIC IMAGING:  I have independently reviewed the scans and discussed with the patient. CT HEAD WO CONTRAST  Result Date: 11/11/2020 CLINICAL DATA:  Syncope, delirium. EXAM: CT HEAD WITHOUT CONTRAST TECHNIQUE: Contiguous axial images were obtained from the base of the skull through the vertex without intravenous contrast. COMPARISON:  October 19, 2016. FINDINGS: Brain: No evidence of acute infarction, hemorrhage, hydrocephalus, extra-axial collection or mass lesion/mass effect. Vascular: No hyperdense vessel or unexpected calcification. Skull: Normal. Negative for fracture or focal lesion. Sinuses/Orbits: Pansinusitis is noted. Other: None. IMPRESSION: No acute intracranial abnormality seen.  Pansinusitis is noted. Electronically Signed   By: Marijo Conception M.D.   On: 11/11/2020 12:53   CT Angio Chest Pulmonary Embolism (PE) W or WO Contrast  Result Date: 11/11/2020 CLINICAL DATA:  Syncopal episode. EXAM: CT ANGIOGRAPHY CHEST WITH CONTRAST TECHNIQUE:  Multidetector CT imaging of the chest was performed using the standard  protocol during bolus administration of intravenous contrast. Multiplanar CT image reconstructions and MIPs were obtained to evaluate the vascular anatomy. CONTRAST:  27m OMNIPAQUE IOHEXOL 350 MG/ML SOLN COMPARISON:  June 26, 2020 FINDINGS: Cardiovascular: A left-sided venous Port-A-Cath is in place. Satisfactory opacification of the pulmonary arteries to the segmental level. No evidence of pulmonary embolism. Normal heart size with marked severity coronary artery calcification. No pericardial effusion. Mediastinum/Nodes: No enlarged mediastinal, hilar, or axillary lymph nodes. Thyroid gland, trachea, and esophagus demonstrate no significant findings. Lungs/Pleura: Stable areas of scarring and/or atelectasis are seen within the posteromedial aspects of the bilateral apices. Additional stable fibrotic changes are noted within the posterior aspect of the bilateral upper lobes and bilateral lower lobes. A stable 4 mm noncalcified lung nodule is seen within the anterolateral aspect of the right upper lobe (axial CT image 56, CT series 6). Upper Abdomen: No acute abnormality. Musculoskeletal: Multiple chronic compression fracture deformities are seen throughout the mid and lower thoracic spine with evidence of prior vertebroplasty noted. Review of the MIP images confirms the above findings. IMPRESSION: 1. No evidence of pulmonary embolism. 2. Stable areas of scarring and fibrosis without evidence of superimposed acute cardiopulmonary disease. Electronically Signed   By: TVirgina NorfolkM.D.   On: 11/11/2020 16:16   DG Chest Port 1 View  Result Date: 11/11/2020 CLINICAL DATA:  Shortness of breath, syncopal episode EXAM: PORTABLE CHEST 1 VIEW COMPARISON:  Oct 11, 2020. FINDINGS: LEFT-sided Port-A-Cath in situ terminates at the caval to atrial junction. Cardiomediastinal contours and hilar structures are stable. Interstitial prominence in the RIGHT upper chest persists along with septal thickening at the lung  bases bilaterally. No lobar consolidation. No pneumothorax.  No pleural effusion. Signs of spinal cement augmentation as before without acute or destructive bony finding on limited assessment. IMPRESSION: Stable interstitial prominence in the RIGHT upper chest along with septal thickening at the lung bases bilaterally. Findings reflect chronic changes and are not associated with lobar consolidation or effusion. Electronically Signed   By: GZetta BillsM.D.   On: 11/11/2020 12:04   ECHOCARDIOGRAM COMPLETE  Result Date: 11/12/2020    ECHOCARDIOGRAM REPORT   Patient Name:   HSYLVESTER MINTONDate of Exam: 11/12/2020 Medical Rec #:  [id number]       Height:       76.0 in Accession #:    [id number]      Weight:       133.0 lb Date of Birth:  [birth year]       BSA:          1.863 m Patient Age:    89years         BP:           101/59 mmHg Patient Gender: M                HR:           78 bpm. [facility], Cardiac Doppler and Color Doppler Indications:    Syncope  History:        Patient has no prior history of Echocardiogram examinations.                 COPD; Signs/Symptoms:Resp. failure,Covid+, CKD. Chemo,                 Myelodysplastic syndrome.  Sonographer:    BDustin FlockRDCS Referring  Phys: [id number] Bethena Roys  Sonographer Comments: Image acquisition challenging due to COPD and Image acquisition challenging due to respiratory motion. IMPRESSIONS  1. Left ventricular ejection fraction, by estimation, is 55 to 60%. The left ventricle has normal function. Left ventricular endocardial border not optimally defined to evaluate regional wall motion. Left ventricular diastolic parameters are consistent with Grade I diastolic dysfunction (impaired relaxation).  2. Right ventricular systolic function is normal. The right ventricular size is normal.  3. The mitral valve is normal in structure. No evidence of mitral valve regurgitation. No evidence of mitral stenosis.  4.  The aortic valve has an indeterminant number of cusps. There is moderate calcification of the aortic valve. There is moderate thickening of the aortic valve. Aortic valve regurgitation is not visualized. No aortic stenosis is present. FINDINGS  Left Ventricle: Left ventricular ejection fraction, by estimation, is 55 to 60%. The left ventricle has normal function. Left ventricular endocardial border not optimally defined to evaluate regional wall motion. The left ventricular internal cavity size was normal in size. There is no left ventricular hypertrophy. Left ventricular diastolic parameters are consistent with Grade I diastolic dysfunction (impaired relaxation). Normal left ventricular filling pressure. Right Ventricle: The right ventricular size is normal. Right vetricular wall thickness was not assessed. Right ventricular systolic function is normal. Left Atrium: Left atrial size was normal in size. Right Atrium: Right atrial size was normal in size. Pericardium: There is no evidence of pericardial effusion. Mitral Valve: The mitral valve is normal in structure. There is mild thickening of the mitral valve leaflet(s). There is mild calcification of the mitral valve leaflet(s). Mild to moderate mitral annular calcification. No evidence of mitral valve regurgitation. No evidence of mitral valve stenosis. Tricuspid Valve: The tricuspid valve is normal in structure. Tricuspid valve regurgitation is not demonstrated. No evidence of tricuspid stenosis. Aortic Valve: The aortic valve has an indeterminant number of cusps. There is moderate calcification of the aortic valve. There is moderate thickening of the aortic valve. There is moderate aortic valve annular calcification. Aortic valve regurgitation is not visualized. No aortic stenosis is present. Pulmonic Valve: The pulmonic valve was not well visualized. Pulmonic valve regurgitation is not visualized. No evidence of pulmonic stenosis. Aorta: The aortic root is  normal in size and structure. Pulmonary Artery: Indeterminant PASP, inadequate TR jet. Venous: The inferior vena cava was not well visualized. IAS/Shunts: The interatrial septum was not well visualized.  LEFT VENTRICLE PLAX 2D LVIDd:         3.83 cm  Diastology LVIDs:         2.77 cm  LV e' medial:    5.26 cm/s LV PW:         0.99 cm  LV E/e' medial:  12.3 LV IVS:        0.98 cm  LV e' lateral:   11.60 cm/s LVOT diam:     2.30 cm  LV E/e' lateral: 5.6 LV SV:         91 LV SV Index:   49 LVOT Area:     4.15 cm  RIGHT VENTRICLE RV Basal diam:  2.99 cm RV S prime:     11.40 cm/s TAPSE (M-mode): 1.6 cm LEFT ATRIUM           Index       RIGHT ATRIUM           Index LA diam:      3.00 cm 1.61 cm/m  RA Area:  14.00 cm LA Vol (A2C): 17.5 ml 9.40 ml/m  RA Volume:   37.00 ml  19.86 ml/m LA Vol (A4C): 57.7 ml 30.98 ml/m  AORTIC VALVE LVOT Vmax:   113.00 cm/s LVOT Vmean:  76.000 cm/s LVOT VTI:    0.218 m  AORTA Ao Root diam: 3.40 cm MITRAL VALVE MV Area (PHT): 3.58 cm    SHUNTS MV Decel Time: 212 msec    Systemic VTI:  0.22 m MV E velocity: 64.90 cm/s  Systemic Diam: 2.30 cm MV A velocity: 69.80 cm/s MV E/A ratio:  0.93 Carlyle Dolly MD Electronically signed by Carlyle Dolly MD Signature Date/Time: 11/12/2020/3:42:31 PM    Final      ASSESSMENT:  1.  Myelodysplastic syndrome with pancytopenia: - Bone marrow biopsy on 12/08/2016 in [place], [place] showed normocellular marrow with trilineage hematopoiesis with erythroid predominance and dysmegakaryopoiesis.  Blasts were not increased.  Cytogenetics showed 46, XY,del(12)(p.2p13)[8]/46,XY[12].  FISH was positive for 12p-. - Bone marrow biopsy and testing in 2018 was consistent with myelodysplastic syndrome with single lineage dysplasia (MDS-SLD),IPSS-R score of 2, consistent with low risk category -  Repeat bone marrow biopsy performed on 08/13/2019 by Dr. Delton Coombes with the following results: Hypercellular bone marrow with dyspoietic changes Bone marrow  aspirate with 5% blastic cells of granulocytic precursors Flow pathology with 3% blasts Peripheral blood with 2% circulating blasts Cytogenetic analysis show del(12p) and 13 of the examined cells with remaining 7 cells showing normal chromosome Karyotype: 46,XY,del(12)(p13p11.2)[13]/46,XY[7] FISH analysis was normal - Based on most recent bone marrow biopsy (08/04/2020), patient's IPSS-R is 5, showing that he has progressed to the high risk category - He has required multiple blood transfusions over the past 6 months, most recently on 08/09/2020 (Hgb 7.1) - No improvement on monthly Aranesp or on weekly Retacrit injections (started on 07/10/2020 - discontinued 09/04/2020) - Stool occult blood cards x3 were negative -Azacitidine (50 mg/m2) x5 days started on 09/16/2020.   PLAN:  1.  Myelodysplastic syndrome (high risk) with pancytopenia  - First cycle of azacitidine on 09/16/2020, 50 mg per metered squared for 5 days. - admitted from 11/11/2020 to 11/13/2020 for COVID infection. Ends quarantine today.  - Reviewed his labs today which showed hemoglobin 8.4.  Platelet count is 86.  Denies any bleeding issues. - OK to proceed with azacitidine treatment today, continued x 7 days --RTC in 2 weeks time with Dr. Raliegh Ip.    2. CKD stage 3b Creatinine today at 1.52 - Encouraged hydration.   Orders placed this encounter:  No orders of the defined types were placed in this encounter.  Ledell Peoples, MD Department of Hematology/Oncology [facility] Phone: [phone] Pager: [phone] Email: [email or web address]

## 2020-11-19 ENCOUNTER — Inpatient Hospital Stay (HOSPITAL_COMMUNITY): Payer: Medicare Other | Attending: Hematology

## 2020-11-19 ENCOUNTER — Inpatient Hospital Stay (HOSPITAL_BASED_OUTPATIENT_CLINIC_OR_DEPARTMENT_OTHER): Payer: Medicare Other | Admitting: Hematology and Oncology

## 2020-11-19 ENCOUNTER — Inpatient Hospital Stay (HOSPITAL_COMMUNITY): Payer: Medicare Other

## 2020-11-19 ENCOUNTER — Other Ambulatory Visit: Payer: Self-pay

## 2020-11-19 VITALS — BP 99/58 | HR 99 | Temp 96.7°F | Resp 20 | Wt 127.2 lb

## 2020-11-19 VITALS — BP 105/61 | HR 80 | Resp 16

## 2020-11-19 DIAGNOSIS — D61818 Other pancytopenia: Secondary | ICD-10-CM | POA: Insufficient documentation

## 2020-11-19 DIAGNOSIS — D469 Myelodysplastic syndrome, unspecified: Secondary | ICD-10-CM | POA: Diagnosis not present

## 2020-11-19 DIAGNOSIS — D649 Anemia, unspecified: Secondary | ICD-10-CM

## 2020-11-19 DIAGNOSIS — N1831 Chronic kidney disease, stage 3a: Secondary | ICD-10-CM | POA: Diagnosis not present

## 2020-11-19 LAB — COMPREHENSIVE METABOLIC PANEL
ALT: 13 U/L (ref 0–44)
AST: 20 U/L (ref 15–41)
Albumin: 3.2 g/dL — ABNORMAL LOW (ref 3.5–5.0)
Alkaline Phosphatase: 67 U/L (ref 38–126)
Anion gap: 9 (ref 5–15)
BUN: 29 mg/dL — ABNORMAL HIGH (ref 8–23)
CO2: 24 mmol/L (ref 22–32)
Calcium: 9.2 mg/dL (ref 8.9–10.3)
Chloride: 104 mmol/L (ref 98–111)
Creatinine, Ser: 1.77 mg/dL — ABNORMAL HIGH (ref 0.61–1.24)
GFR, Estimated: 36 mL/min — ABNORMAL LOW (ref >60)
Glucose, Bld: 116 mg/dL — ABNORMAL HIGH (ref 70–99)
Potassium: 3.6 mmol/L (ref 3.5–5.1)
Sodium: 137 mmol/L (ref 135–145)
Total Bilirubin: 0.8 mg/dL (ref 0.3–1.2)
Total Protein: 6.7 g/dL (ref 6.5–8.1)

## 2020-11-19 LAB — CBC WITH DIFFERENTIAL/PLATELET
Basophils Absolute: 0 10*3/uL (ref 0.0–0.1)
Basophils Relative: 0 %
Eosinophils Absolute: 0 10*3/uL (ref 0.0–0.5)
Eosinophils Relative: 1 %
HCT: 30.8 % — ABNORMAL LOW (ref 39.0–52.0)
Hemoglobin: 9.6 g/dL — ABNORMAL LOW (ref 13.0–17.0)
Lymphocytes Relative: 20 %
Lymphs Abs: 0.5 10*3/uL — ABNORMAL LOW (ref 0.7–4.0)
MCH: 26.3 pg (ref 26.0–34.0)
MCHC: 31.2 g/dL (ref 30.0–36.0)
MCV: 84.4 fL (ref 80.0–100.0)
Monocytes Absolute: 0.1 10*3/uL (ref 0.1–1.0)
Monocytes Relative: 2 %
Neutro Abs: 2.1 10*3/uL (ref 1.7–7.7)
Neutrophils Relative %: 77 %
Platelets: 71 10*3/uL — ABNORMAL LOW (ref 150–400)
RBC: 3.65 MIL/uL — ABNORMAL LOW (ref 4.22–5.81)
RDW: 18.2 % — ABNORMAL HIGH (ref 11.5–15.5)
WBC: 2.7 10*3/uL — ABNORMAL LOW (ref 4.0–10.5)
nRBC: 0 % (ref 0.0–0.2)

## 2020-11-19 LAB — LACTATE DEHYDROGENASE: LDH: 128 U/L (ref 98–192)

## 2020-11-19 MED ORDER — SODIUM CHLORIDE 0.9% FLUSH
10.0000 mL | INTRAVENOUS | Status: DC | PRN
Start: 2020-11-19 — End: 2020-11-19
  Administered 2020-11-19: 10 mL via INTRAVENOUS

## 2020-11-19 MED ORDER — SODIUM CHLORIDE 0.9% FLUSH
10.0000 mL | INTRAVENOUS | Status: DC | PRN
  Administered 2020-11-19: 10 mL

## 2020-11-19 MED ORDER — SODIUM CHLORIDE 0.9 % IV SOLN
10.0000 mg | Freq: Once | INTRAVENOUS | Status: AC
  Administered 2020-11-19: 10 mg via INTRAVENOUS
  Filled 2020-11-19: qty 10

## 2020-11-19 MED ORDER — SODIUM CHLORIDE 0.9 % IV SOLN
50.0000 mg/m2 | Freq: Once | INTRAVENOUS | Status: AC
  Administered 2020-11-19: 90 mg via INTRAVENOUS
  Filled 2020-11-19: qty 9

## 2020-11-19 MED ORDER — SODIUM CHLORIDE 0.9 % IV SOLN: Freq: Once | INTRAVENOUS | Status: AC

## 2020-11-19 MED ORDER — PALONOSETRON HCL INJECTION 0.25 MG/5ML
INTRAVENOUS | Status: AC
  Filled 2020-11-19: qty 5

## 2020-11-19 MED ORDER — HEPARIN SOD (PORK) LOCK FLUSH 100 UNIT/ML IV SOLN
500.0000 [IU] | Freq: Once | INTRAVENOUS | Status: AC | PRN
  Administered 2020-11-19: 500 [IU]

## 2020-11-19 MED ORDER — PALONOSETRON HCL INJECTION 0.25 MG/5ML
0.2500 mg | Freq: Once | INTRAVENOUS | Status: AC
Start: 2020-11-19 — End: 2020-11-19
  Administered 2020-11-19: 0.25 mg via INTRAVENOUS

## 2020-11-19 NOTE — Progress Notes (Signed)
Patient has been assessed, vital signs and labs have been reviewed by Dr. Dorsey. ANC, Creatinine, LFTs, and Platelets are within treatment parameters per Dr. Dorsey. The patient is good to proceed with treatment at this time.  Primary RN and pharmacy aware.  

## 2020-11-19 NOTE — Patient Instructions (Signed)
[facility]  Discharge Instructions: Thank you for choosing [facility] to provide your oncology and hematology care.  If you have a lab appointment with the Cancer Center, please come in thru the Main Entrance and check in at the main information desk.  Wear comfortable clothing and clothing appropriate for easy access to any Portacath or PICC line.   We strive to give you quality time with your provider. You may need to reschedule your appointment if you arrive late (15 or more minutes).  Arriving late affects you and other patients whose appointments are after yours.  Also, if you miss three or more appointments without notifying the office, you may be dismissed from the clinic at the provider's discretion.      For prescription refill requests, have your pharmacy contact our office and allow 72 hours for refills to be completed.    Today you received the following chemotherapy and/or immunotherapy agents    To help prevent nausea and vomiting after your treatment, we encourage you to take your nausea medication as directed.  BELOW ARE SYMPTOMS THAT SHOULD BE REPORTED IMMEDIATELY: . *FEVER GREATER THAN 100.4 F (38 C) OR HIGHER . *CHILLS OR SWEATING . *NAUSEA AND VOMITING THAT IS NOT CONTROLLED WITH YOUR NAUSEA MEDICATION . *UNUSUAL SHORTNESS OF BREATH . *UNUSUAL BRUISING OR BLEEDING . *URINARY PROBLEMS (pain or burning when urinating, or frequent urination) . *BOWEL PROBLEMS (unusual diarrhea, constipation, pain near the anus) . TENDERNESS IN MOUTH AND THROAT WITH OR WITHOUT PRESENCE OF ULCERS (sore throat, sores in mouth, or a toothache) . UNUSUAL RASH, SWELLING OR PAIN  . UNUSUAL VAGINAL DISCHARGE OR ITCHING   Items with * indicate a potential emergency and should be followed up as soon as possible or go to the Emergency Department if any problems should occur.  Please show the CHEMOTHERAPY ALERT CARD or IMMUNOTHERAPY ALERT CARD at check-in to the  Emergency Department and triage nurse.  Should you have questions after your visit or need to cancel or reschedule your appointment, please contact [facility] [phone]  and follow the prompts.  Office hours are 8:00 a.m. to 4:30 p.m. Monday - Friday. Please note that voicemails left after 4:00 p.m. may not be returned until the following business day.  We are closed weekends and major holidays. You have access to a nurse at all times for urgent questions. Please call the main number to the clinic [phone] and follow the prompts.  For any non-urgent questions, you may also contact your provider using MyChart. We now offer e-Visits for anyone 18 and older to request care online for non-urgent symptoms. For details visit [email or web address].   Also download the MyChart app! Go to the app store, search "MyChart", open the app, select [facility], and log in with your MyChart username and password.  Due to Covid, a mask is required upon entering the hospital/clinic. If you do not have a mask, one will be given to you upon arrival. For doctor visits, patients may have 1 support person aged 18 or older with them. For treatment visits, patients cannot have anyone with them due to current Covid guidelines and our immunocompromised population.  

## 2020-11-19 NOTE — Progress Notes (Signed)
Patient presents today for treatment per orders.  Patient tolerated treatment well with no complaints voiced.  Patient left ambulatory in stable condition.  Vital signs stable at discharge.  Follow up as scheduled.    

## 2020-11-19 NOTE — Progress Notes (Signed)
Ok to proceed with treatment per Dr Lorenso Courier.  [name], PharmD

## 2020-11-20 ENCOUNTER — Inpatient Hospital Stay (HOSPITAL_COMMUNITY): Payer: Medicare Other

## 2020-11-20 VITALS — BP 119/76 | HR 76 | Temp 98.2°F | Resp 16

## 2020-11-20 DIAGNOSIS — D61818 Other pancytopenia: Secondary | ICD-10-CM | POA: Diagnosis not present

## 2020-11-20 DIAGNOSIS — D469 Myelodysplastic syndrome, unspecified: Secondary | ICD-10-CM

## 2020-11-20 MED ORDER — SODIUM CHLORIDE 0.9 % IV SOLN
50.0000 mg/m2 | Freq: Once | INTRAVENOUS | Status: AC
  Administered 2020-11-20: 90 mg via INTRAVENOUS
  Filled 2020-11-20: qty 9

## 2020-11-20 MED ORDER — SODIUM CHLORIDE 0.9 % IV SOLN: Freq: Once | INTRAVENOUS | Status: AC

## 2020-11-20 MED ORDER — SODIUM CHLORIDE 0.9% FLUSH
10.0000 mL | INTRAVENOUS | Status: DC | PRN
  Administered 2020-11-20: 10 mL

## 2020-11-20 MED ORDER — HEPARIN SOD (PORK) LOCK FLUSH 100 UNIT/ML IV SOLN
500.0000 [IU] | Freq: Once | INTRAVENOUS | Status: AC | PRN
  Administered 2020-11-20: 500 [IU]

## 2020-11-20 MED ORDER — SODIUM CHLORIDE 0.9 % IV SOLN
10.0000 mg | Freq: Once | INTRAVENOUS | Status: AC
  Administered 2020-11-20: 10 mg via INTRAVENOUS
  Filled 2020-11-20: qty 10

## 2020-11-20 NOTE — Patient Instructions (Signed)
[facility]  Discharge Instructions: Thank you for choosing [facility] to provide your oncology and hematology care.  If you have a lab appointment with the Cancer Center, please come in thru the Main Entrance and check in at the main information desk.  Wear comfortable clothing and clothing appropriate for easy access to any Portacath or PICC line.   We strive to give you quality time with your provider. You may need to reschedule your appointment if you arrive late (15 or more minutes).  Arriving late affects you and other patients whose appointments are after yours.  Also, if you miss three or more appointments without notifying the office, you may be dismissed from the clinic at the provider's discretion.      For prescription refill requests, have your pharmacy contact our office and allow 72 hours for refills to be completed.    Today you received the following chemotherapy and/or immunotherapy agents    To help prevent nausea and vomiting after your treatment, we encourage you to take your nausea medication as directed.  BELOW ARE SYMPTOMS THAT SHOULD BE REPORTED IMMEDIATELY: . *FEVER GREATER THAN 100.4 F (38 C) OR HIGHER . *CHILLS OR SWEATING . *NAUSEA AND VOMITING THAT IS NOT CONTROLLED WITH YOUR NAUSEA MEDICATION . *UNUSUAL SHORTNESS OF BREATH . *UNUSUAL BRUISING OR BLEEDING . *URINARY PROBLEMS (pain or burning when urinating, or frequent urination) . *BOWEL PROBLEMS (unusual diarrhea, constipation, pain near the anus) . TENDERNESS IN MOUTH AND THROAT WITH OR WITHOUT PRESENCE OF ULCERS (sore throat, sores in mouth, or a toothache) . UNUSUAL RASH, SWELLING OR PAIN  . UNUSUAL VAGINAL DISCHARGE OR ITCHING   Items with * indicate a potential emergency and should be followed up as soon as possible or go to the Emergency Department if any problems should occur.  Please show the CHEMOTHERAPY ALERT CARD or IMMUNOTHERAPY ALERT CARD at check-in to the  Emergency Department and triage nurse.  Should you have questions after your visit or need to cancel or reschedule your appointment, please contact [facility] [phone]  and follow the prompts.  Office hours are 8:00 a.m. to 4:30 p.m. Monday - Friday. Please note that voicemails left after 4:00 p.m. may not be returned until the following business day.  We are closed weekends and major holidays. You have access to a nurse at all times for urgent questions. Please call the main number to the clinic [phone] and follow the prompts.  For any non-urgent questions, you may also contact your provider using MyChart. We now offer e-Visits for anyone 18 and older to request care online for non-urgent symptoms. For details visit [email or web address].   Also download the MyChart app! Go to the app store, search "MyChart", open the app, select [place], and log in with your MyChart username and password.  Due to Covid, a mask is required upon entering the hospital/clinic. If you do not have a mask, one will be given to you upon arrival. For doctor visits, patients may have 1 support person aged 18 or older with them. For treatment visits, patients cannot have anyone with them due to current Covid guidelines and our immunocompromised population.  

## 2020-11-20 NOTE — Progress Notes (Signed)
Treatment given per orders. Patient tolerated it well without problems. Vitals stable and discharged home from clinic via wheelchair Follow up as scheduled.  

## 2020-11-21 ENCOUNTER — Inpatient Hospital Stay (HOSPITAL_COMMUNITY): Payer: Medicare Other

## 2020-11-21 ENCOUNTER — Inpatient Hospital Stay (HOSPITAL_COMMUNITY): Payer: Medicare Other | Admitting: Dietician

## 2020-11-21 ENCOUNTER — Other Ambulatory Visit: Payer: Self-pay

## 2020-11-21 VITALS — BP 130/61 | HR 77 | Temp 98.5°F | Resp 16

## 2020-11-21 DIAGNOSIS — D61818 Other pancytopenia: Secondary | ICD-10-CM | POA: Diagnosis not present

## 2020-11-21 DIAGNOSIS — D469 Myelodysplastic syndrome, unspecified: Secondary | ICD-10-CM | POA: Diagnosis not present

## 2020-11-21 MED ORDER — SODIUM CHLORIDE 0.9% FLUSH
10.0000 mL | INTRAVENOUS | Status: DC | PRN
  Administered 2020-11-21: 10 mL

## 2020-11-21 MED ORDER — SODIUM CHLORIDE 0.9 % IV SOLN: Freq: Once | INTRAVENOUS | Status: AC

## 2020-11-21 MED ORDER — DEXAMETHASONE SODIUM PHOSPHATE 100 MG/10ML IJ SOLN
10.0000 mg | Freq: Once | INTRAMUSCULAR | Status: AC
  Administered 2020-11-21: 10 mg via INTRAVENOUS
  Filled 2020-11-21: qty 10

## 2020-11-21 MED ORDER — AZACITIDINE CHEMO INJECTION 100 MG
50.0000 mg/m2 | Freq: Once | INTRAMUSCULAR | Status: AC
  Administered 2020-11-21: 90 mg via INTRAVENOUS
  Filled 2020-11-21: qty 9

## 2020-11-21 MED ORDER — PALONOSETRON HCL INJECTION 0.25 MG/5ML
0.2500 mg | Freq: Once | INTRAVENOUS | Status: AC
  Administered 2020-11-21: 0.25 mg via INTRAVENOUS
  Filled 2020-11-21: qty 5

## 2020-11-21 MED ORDER — HEPARIN SOD (PORK) LOCK FLUSH 100 UNIT/ML IV SOLN
500.0000 [IU] | Freq: Once | INTRAVENOUS | Status: AC | PRN
  Administered 2020-11-21: 500 [IU]

## 2020-11-21 NOTE — Progress Notes (Signed)
Treatment given per orders. Patient tolerated it well without problems. Vitals stable and discharged home from clinic via wheelchair Follow up as scheduled.  

## 2020-11-21 NOTE — Patient Instructions (Signed)
[facility]  Discharge Instructions: Thank you for choosing [facility] to provide your oncology and hematology care.  If you have a lab appointment with the Cancer Center, please come in thru the Main Entrance and check in at the main information desk.  Wear comfortable clothing and clothing appropriate for easy access to any Portacath or PICC line.   We strive to give you quality time with your provider. You may need to reschedule your appointment if you arrive late (15 or more minutes).  Arriving late affects you and other patients whose appointments are after yours.  Also, if you miss three or more appointments without notifying the office, you may be dismissed from the clinic at the provider's discretion.      For prescription refill requests, have your pharmacy contact our office and allow 72 hours for refills to be completed.        To help prevent nausea and vomiting after your treatment, we encourage you to take your nausea medication as directed.  BELOW ARE SYMPTOMS THAT SHOULD BE REPORTED IMMEDIATELY: *FEVER GREATER THAN 100.4 F (38 C) OR HIGHER *CHILLS OR SWEATING *NAUSEA AND VOMITING THAT IS NOT CONTROLLED WITH YOUR NAUSEA MEDICATION *UNUSUAL SHORTNESS OF BREATH *UNUSUAL BRUISING OR BLEEDING *URINARY PROBLEMS (pain or burning when urinating, or frequent urination) *BOWEL PROBLEMS (unusual diarrhea, constipation, pain near the anus) TENDERNESS IN MOUTH AND THROAT WITH OR WITHOUT PRESENCE OF ULCERS (sore throat, sores in mouth, or a toothache) UNUSUAL RASH, SWELLING OR PAIN  UNUSUAL VAGINAL DISCHARGE OR ITCHING   Items with * indicate a potential emergency and should be followed up as soon as possible or go to the Emergency Department if any problems should occur.  Please show the CHEMOTHERAPY ALERT CARD or IMMUNOTHERAPY ALERT CARD at check-in to the Emergency Department and triage nurse.  Should you have questions after your visit or need to cancel  or reschedule your appointment, please contact [facility] [phone]  and follow the prompts.  Office hours are 8:00 a.m. to 4:30 p.m. Monday - Friday. Please note that voicemails left after 4:00 p.m. may not be returned until the following business day.  We are closed weekends and major holidays. You have access to a nurse at all times for urgent questions. Please call the main number to the clinic [phone] and follow the prompts.  For any non-urgent questions, you may also contact your provider using MyChart. We now offer e-Visits for anyone 18 and older to request care online for non-urgent symptoms. For details visit [email or web address].   Also download the MyChart app! Go to the app store, search "MyChart", open the app, select [place], and log in with your MyChart username and password.  Due to Covid, a mask is required upon entering the hospital/clinic. If you do not have a mask, one will be given to you upon arrival. For doctor visits, patients may have 1 support person aged 18 or older with them. For treatment visits, patients cannot have anyone with them due to current Covid guidelines and our immunocompromised population.  

## 2020-11-21 NOTE — Progress Notes (Signed)
Nutrition Follow-up:  Patient with myelodysplastic syndrome. He is receiving azacitidine.   Noted hospital admission 6/27-6/29 after syncopal episode. Patient found to be COVID+  Met with patient in infusion. He reports he has recovered from Covid infection and feeling good. Patient reports appetite is about the same, eating 2 meals and drinking 2 Ensure Original daily (220 kcal, 9 g protein). Patient had prime rib and potatoes last night for dinner, says it was so good. He is going to pick up a breakfast sandwich on his way home today. Patient reports he has not been taking Marinol, does not feel this has been beneficial. He was only taking once daily, he did not increase to twice daily as prescribed as previously discussed on 6/20 visit.   Medications: reviewed  Labs: 7/5 Glucose 116, BUN 29, Cr 1.77  Anthropometrics: Weight 127 lb 3.2 oz on 7/5 decreased 6 lbs (4.5%) in the last month  6/8 - 133 lb 12.8 oz 5/17 - 137 lb 4.8 oz 5/2 - 134 lb 14.4 oz 4/20 - 134 lb 3.2 oz 3/23 - 134 lb 0.6 oz   NUTRITION DIAGNOSIS: Unintentional weight loss ongoing   INTERVENTION:  Encouraged patient to take appetite stimulant as prescribed  Reviewed strategies for poor appetite and high calorie, high protein snack ideas Patient agreeable to drinking 3 Ensure/day - reminded pt to switch to Ensure Plus for more calories and protein Pt declined coupons or assistance with supplements   MONITORING, EVALUATION, GOAL: weight trends, intake   NEXT VISIT: f/u ~2-3 weeks - to be scheduled with treatment

## 2020-11-22 ENCOUNTER — Inpatient Hospital Stay (HOSPITAL_COMMUNITY): Payer: Medicare Other

## 2020-11-22 VITALS — BP 126/63 | HR 66 | Temp 97.6°F | Resp 18

## 2020-11-22 DIAGNOSIS — D469 Myelodysplastic syndrome, unspecified: Secondary | ICD-10-CM | POA: Diagnosis not present

## 2020-11-22 DIAGNOSIS — D61818 Other pancytopenia: Secondary | ICD-10-CM | POA: Diagnosis not present

## 2020-11-22 MED ORDER — SODIUM CHLORIDE 0.9 % IV SOLN
10.0000 mg | Freq: Once | INTRAVENOUS | Status: AC
  Administered 2020-11-22: 10 mg via INTRAVENOUS
  Filled 2020-11-22: qty 10

## 2020-11-22 MED ORDER — SODIUM CHLORIDE 0.9% FLUSH
10.0000 mL | INTRAVENOUS | Status: DC | PRN
Start: 2020-11-22 — End: 2020-11-22
  Administered 2020-11-22: 10 mL

## 2020-11-22 MED ORDER — SODIUM CHLORIDE 0.9 % IV SOLN
50.0000 mg/m2 | Freq: Once | INTRAVENOUS | Status: AC
  Administered 2020-11-22: 90 mg via INTRAVENOUS
  Filled 2020-11-22: qty 9

## 2020-11-22 MED ORDER — HEPARIN SOD (PORK) LOCK FLUSH 100 UNIT/ML IV SOLN
500.0000 [IU] | Freq: Once | INTRAVENOUS | Status: AC | PRN
  Administered 2020-11-22: 500 [IU]

## 2020-11-22 MED ORDER — SODIUM CHLORIDE 0.9 % IV SOLN: Freq: Once | INTRAVENOUS | Status: AC

## 2020-11-22 NOTE — Progress Notes (Signed)
Treatment given per orders. Patient tolerated it well without problems. Vitals stable and discharged home from clinic via wheelchair Follow up as scheduled.  

## 2020-11-22 NOTE — Patient Instructions (Signed)
[name]  Discharge Instructions: Thank you for choosing [facility] to provide your oncology and hematology care.  If you have a lab appointment with the [facility], please come in thru the Main Entrance and check in at the main information desk.  Wear comfortable clothing and clothing appropriate for easy access to any Portacath or PICC line.   We strive to give you quality time with your provider. You may need to reschedule your appointment if you arrive late (15 or more minutes).  Arriving late affects you and other patients whose appointments are after yours.  Also, if you miss three or more appointments without notifying the office, you may be dismissed from the clinic at the provider's discretion.      For prescription refill requests, have your pharmacy contact our office and allow 72 hours for refills to be comple   To help prevent nausea and vomiting after your treatment, we encourage you to take your nausea medication as directed.  BELOW ARE SYMPTOMS THAT SHOULD BE REPORTED IMMEDIATELY: *FEVER GREATER THAN 100.4 F (38 C) OR HIGHER *CHILLS OR SWEATING *NAUSEA AND VOMITING THAT IS NOT CONTROLLED WITH YOUR NAUSEA MEDICATION *UNUSUAL SHORTNESS OF BREATH *UNUSUAL BRUISING OR BLEEDING *URINARY PROBLEMS (pain or burning when urinating, or frequent urination) *BOWEL PROBLEMS (unusual diarrhea, constipation, pain near the anus) TENDERNESS IN MOUTH AND THROAT WITH OR WITHOUT PRESENCE OF ULCERS (sore throat, sores in mouth, or a toothache) UNUSUAL RASH, SWELLING OR PAIN  UNUSUAL VAGINAL DISCHARGE OR ITCHING   Items with * indicate a potential emergency and should be followed up as soon as possible or go to the Emergency Department if any problems should occur.  Please show the CHEMOTHERAPY ALERT CARD or IMMUNOTHERAPY ALERT CARD at check-in to the Emergency Department and triage nurse.  Should you have questions after your visit or need to cancel or  reschedule your appointment, please contact [facility] [phone]  and follow the prompts.  Office hours are 8:00 a.m. to 4:30 p.m. Monday - Friday. Please note that voicemails left after 4:00 p.m. may not be returned until the following business day.  We are closed weekends and major holidays. You have access to a nurse at all times for urgent questions. Please call the main number to the clinic [phone] and follow the prompts.  For any non-urgent questions, you may also contact your provider using MyChart. We now offer e-Visits for anyone 56 and older to request care online for non-urgent symptoms. For details visit [email or web address].   Also download the MyChart app! Go to the app store, search "MyChart", open the app, select [place], and log in with your MyChart username and password.  Due to Covid, a mask is required upon entering the hospital/clinic. If you do not have a mask, one will be given to you upon arrival. For doctor visits, patients may have 1 support person aged 82 or older with them. For treatment visits, patients cannot have anyone with them due to current Covid guidelines and our immunocompromised population.

## 2020-11-25 ENCOUNTER — Inpatient Hospital Stay (HOSPITAL_COMMUNITY): Payer: Medicare Other

## 2020-11-25 ENCOUNTER — Other Ambulatory Visit: Payer: Self-pay

## 2020-11-25 VITALS — BP 102/79 | HR 72 | Temp 96.9°F | Resp 18 | Wt 128.2 lb

## 2020-11-25 DIAGNOSIS — D469 Myelodysplastic syndrome, unspecified: Secondary | ICD-10-CM

## 2020-11-25 DIAGNOSIS — D61818 Other pancytopenia: Secondary | ICD-10-CM | POA: Diagnosis not present

## 2020-11-25 LAB — COMPREHENSIVE METABOLIC PANEL
ALT: 14 U/L (ref 0–44)
AST: 14 U/L — ABNORMAL LOW (ref 15–41)
Albumin: 3.2 g/dL — ABNORMAL LOW (ref 3.5–5.0)
Alkaline Phosphatase: 61 U/L (ref 38–126)
Anion gap: 6 (ref 5–15)
BUN: 40 mg/dL — ABNORMAL HIGH (ref 8–23)
CO2: 25 mmol/L (ref 22–32)
Calcium: 9.1 mg/dL (ref 8.9–10.3)
Chloride: 106 mmol/L (ref 98–111)
Creatinine, Ser: 1.52 mg/dL — ABNORMAL HIGH (ref 0.61–1.24)
GFR, Estimated: 44 mL/min — ABNORMAL LOW (ref >60)
Glucose, Bld: 108 mg/dL — ABNORMAL HIGH (ref 70–99)
Potassium: 3.8 mmol/L (ref 3.5–5.1)
Sodium: 137 mmol/L (ref 135–145)
Total Bilirubin: 0.6 mg/dL (ref 0.3–1.2)
Total Protein: 6 g/dL — ABNORMAL LOW (ref 6.5–8.1)

## 2020-11-25 LAB — CBC WITH DIFFERENTIAL/PLATELET
Abs Immature Granulocytes: 0.04 10*3/uL (ref 0.00–0.07)
Basophils Absolute: 0 10*3/uL (ref 0.0–0.1)
Basophils Relative: 0 %
Eosinophils Absolute: 0 10*3/uL (ref 0.0–0.5)
Eosinophils Relative: 0 %
HCT: 26.8 % — ABNORMAL LOW (ref 39.0–52.0)
Hemoglobin: 8.4 g/dL — ABNORMAL LOW (ref 13.0–17.0)
Immature Granulocytes: 2 %
Lymphocytes Relative: 16 %
Lymphs Abs: 0.4 10*3/uL — ABNORMAL LOW (ref 0.7–4.0)
MCH: 26.1 pg (ref 26.0–34.0)
MCHC: 31.3 g/dL (ref 30.0–36.0)
MCV: 83.2 fL (ref 80.0–100.0)
Monocytes Absolute: 0.2 10*3/uL (ref 0.1–1.0)
Monocytes Relative: 7 %
Neutro Abs: 1.8 10*3/uL (ref 1.7–7.7)
Neutrophils Relative %: 75 %
Platelets: 86 10*3/uL — ABNORMAL LOW (ref 150–400)
RBC: 3.22 MIL/uL — ABNORMAL LOW (ref 4.22–5.81)
RDW: 19 % — ABNORMAL HIGH (ref 11.5–15.5)
WBC: 2.5 10*3/uL — ABNORMAL LOW (ref 4.0–10.5)
nRBC: 0 % (ref 0.0–0.2)

## 2020-11-25 LAB — LACTATE DEHYDROGENASE: LDH: 120 U/L (ref 98–192)

## 2020-11-25 MED ORDER — SODIUM CHLORIDE 0.9 % IV SOLN
10.0000 mg | Freq: Once | INTRAVENOUS | Status: AC
  Administered 2020-11-25: 10 mg via INTRAVENOUS
  Filled 2020-11-25: qty 10

## 2020-11-25 MED ORDER — SODIUM CHLORIDE 0.9% FLUSH
10.0000 mL | INTRAVENOUS | Status: DC | PRN
  Administered 2020-11-25: 10 mL

## 2020-11-25 MED ORDER — PALONOSETRON HCL INJECTION 0.25 MG/5ML
0.2500 mg | Freq: Once | INTRAVENOUS | Status: AC
  Administered 2020-11-25: 0.25 mg via INTRAVENOUS
  Filled 2020-11-25: qty 5

## 2020-11-25 MED ORDER — SODIUM CHLORIDE 0.9 % IV SOLN
50.0000 mg/m2 | Freq: Once | INTRAVENOUS | Status: AC
  Administered 2020-11-25: 90 mg via INTRAVENOUS
  Filled 2020-11-25: qty 9

## 2020-11-25 MED ORDER — SODIUM CHLORIDE 0.9 % IV SOLN
Freq: Once | INTRAVENOUS | Status: AC
Start: 2020-11-25 — End: 2020-11-25

## 2020-11-25 MED ORDER — HEPARIN SOD (PORK) LOCK FLUSH 100 UNIT/ML IV SOLN
500.0000 [IU] | Freq: Once | INTRAVENOUS | Status: AC | PRN
  Administered 2020-11-25: 500 [IU]

## 2020-11-25 MED ORDER — SODIUM CHLORIDE 0.9 % IV SOLN: INTRAVENOUS | Status: DC

## 2020-11-25 NOTE — Progress Notes (Signed)
Ok to proceed with treatment today per MD. Will only give 5 days of treatment this time, will recheck labs next week for possible blood transfusion and give additional fluids today per MD.   Treatment given per orders. Patient tolerated it well without problems. Vitals stable and discharged home from clinic via motorized scooter. Follow up as scheduled.

## 2020-11-25 NOTE — Patient Instructions (Signed)
[facility]  Discharge Instructions: Thank you for choosing [facility] to provide your oncology and hematology care.  If you have a lab appointment with the Cancer Center, please come in thru the Main Entrance and check in at the main information desk.  Wear comfortable clothing and clothing appropriate for easy access to any Portacath or PICC line.   We strive to give you quality time with your provider. You may need to reschedule your appointment if you arrive late (15 or more minutes).  Arriving late affects you and other patients whose appointments are after yours.  Also, if you miss three or more appointments without notifying the office, you may be dismissed from the clinic at the provider's discretion.      For prescription refill requests, have your pharmacy contact our office and allow 72 hours for refills to be completed.        To help prevent nausea and vomiting after your treatment, we encourage you to take your nausea medication as directed.  BELOW ARE SYMPTOMS THAT SHOULD BE REPORTED IMMEDIATELY: *FEVER GREATER THAN 100.4 F (38 C) OR HIGHER *CHILLS OR SWEATING *NAUSEA AND VOMITING THAT IS NOT CONTROLLED WITH YOUR NAUSEA MEDICATION *UNUSUAL SHORTNESS OF BREATH *UNUSUAL BRUISING OR BLEEDING *URINARY PROBLEMS (pain or burning when urinating, or frequent urination) *BOWEL PROBLEMS (unusual diarrhea, constipation, pain near the anus) TENDERNESS IN MOUTH AND THROAT WITH OR WITHOUT PRESENCE OF ULCERS (sore throat, sores in mouth, or a toothache) UNUSUAL RASH, SWELLING OR PAIN  UNUSUAL VAGINAL DISCHARGE OR ITCHING   Items with * indicate a potential emergency and should be followed up as soon as possible or go to the Emergency Department if any problems should occur.  Please show the CHEMOTHERAPY ALERT CARD or IMMUNOTHERAPY ALERT CARD at check-in to the Emergency Department and triage nurse.  Should you have questions after your visit or need to cancel  or reschedule your appointment, please contact [facility] [phone]  and follow the prompts.  Office hours are 8:00 a.m. to 4:30 p.m. Monday - Friday. Please note that voicemails left after 4:00 p.m. may not be returned until the following business day.  We are closed weekends and major holidays. You have access to a nurse at all times for urgent questions. Please call the main number to the clinic [phone] and follow the prompts.  For any non-urgent questions, you may also contact your provider using MyChart. We now offer e-Visits for anyone 18 and older to request care online for non-urgent symptoms. For details visit [email or web address].   Also download the MyChart app! Go to the app store, search "MyChart", open the app, select [place], and log in with your MyChart username and password.  Due to Covid, a mask is required upon entering the hospital/clinic. If you do not have a mask, one will be given to you upon arrival. For doctor visits, patients may have 1 support person aged 18 or older with them. For treatment visits, patients cannot have anyone with them due to current Covid guidelines and our immunocompromised population.  

## 2020-11-26 ENCOUNTER — Ambulatory Visit (HOSPITAL_COMMUNITY): Payer: Medicare Other

## 2020-11-26 ENCOUNTER — Encounter (HOSPITAL_COMMUNITY): Payer: Self-pay | Admitting: Hematology

## 2020-11-27 ENCOUNTER — Ambulatory Visit (HOSPITAL_COMMUNITY): Payer: Medicare Other

## 2020-11-28 DIAGNOSIS — J9611 Chronic respiratory failure with hypoxia: Secondary | ICD-10-CM | POA: Diagnosis not present

## 2020-11-28 DIAGNOSIS — R55 Syncope and collapse: Secondary | ICD-10-CM | POA: Diagnosis not present

## 2020-11-28 DIAGNOSIS — J449 Chronic obstructive pulmonary disease, unspecified: Secondary | ICD-10-CM | POA: Diagnosis not present

## 2020-11-28 DIAGNOSIS — U071 COVID-19: Secondary | ICD-10-CM | POA: Diagnosis not present

## 2020-12-02 ENCOUNTER — Other Ambulatory Visit (HOSPITAL_COMMUNITY): Payer: Self-pay

## 2020-12-02 ENCOUNTER — Encounter (HOSPITAL_COMMUNITY): Payer: Self-pay

## 2020-12-02 ENCOUNTER — Other Ambulatory Visit: Payer: Self-pay

## 2020-12-02 ENCOUNTER — Inpatient Hospital Stay (HOSPITAL_COMMUNITY): Payer: Medicare Other

## 2020-12-02 ENCOUNTER — Other Ambulatory Visit: Payer: Self-pay | Admitting: Surgery

## 2020-12-02 DIAGNOSIS — D469 Myelodysplastic syndrome, unspecified: Secondary | ICD-10-CM

## 2020-12-02 DIAGNOSIS — L851 Acquired keratosis [keratoderma] palmaris et plantaris: Secondary | ICD-10-CM | POA: Insufficient documentation

## 2020-12-02 DIAGNOSIS — D649 Anemia, unspecified: Secondary | ICD-10-CM

## 2020-12-02 DIAGNOSIS — M545 Low back pain, unspecified: Secondary | ICD-10-CM | POA: Insufficient documentation

## 2020-12-02 DIAGNOSIS — F32A Depression, unspecified: Secondary | ICD-10-CM | POA: Insufficient documentation

## 2020-12-02 DIAGNOSIS — D61818 Other pancytopenia: Secondary | ICD-10-CM | POA: Diagnosis not present

## 2020-12-02 DIAGNOSIS — M810 Age-related osteoporosis without current pathological fracture: Secondary | ICD-10-CM | POA: Insufficient documentation

## 2020-12-02 DIAGNOSIS — R319 Hematuria, unspecified: Secondary | ICD-10-CM | POA: Insufficient documentation

## 2020-12-02 LAB — CBC WITH DIFFERENTIAL/PLATELET
Abs Immature Granulocytes: 0.09 10*3/uL — ABNORMAL HIGH (ref 0.00–0.07)
Basophils Absolute: 0 10*3/uL (ref 0.0–0.1)
Basophils Relative: 0 %
Eosinophils Absolute: 0 10*3/uL (ref 0.0–0.5)
Eosinophils Relative: 0 %
HCT: 24.8 % — ABNORMAL LOW (ref 39.0–52.0)
Hemoglobin: 7.6 g/dL — ABNORMAL LOW (ref 13.0–17.0)
Lymphocytes Relative: 30 %
Lymphs Abs: 0.9 10*3/uL (ref 0.7–4.0)
MCH: 25.9 pg — ABNORMAL LOW (ref 26.0–34.0)
MCHC: 30.6 g/dL (ref 30.0–36.0)
MCV: 84.4 fL (ref 80.0–100.0)
Monocytes Absolute: 0.2 10*3/uL (ref 0.1–1.0)
Monocytes Relative: 6 %
Neutro Abs: 2 10*3/uL (ref 1.7–7.7)
Neutrophils Relative %: 64 %
Platelets: 42 10*3/uL — ABNORMAL LOW (ref 150–400)
RBC: 2.94 MIL/uL — ABNORMAL LOW (ref 4.22–5.81)
RDW: 19.4 % — ABNORMAL HIGH (ref 11.5–15.5)
WBC: 3.1 10*3/uL — ABNORMAL LOW (ref 4.0–10.5)
nRBC: 0 % (ref 0.0–0.2)

## 2020-12-02 LAB — PREPARE RBC (CROSSMATCH)

## 2020-12-02 MED ORDER — HEPARIN SOD (PORK) LOCK FLUSH 100 UNIT/ML IV SOLN: 250.0000 [IU] | INTRAVENOUS | Status: DC | PRN

## 2020-12-02 MED ORDER — DIPHENHYDRAMINE HCL 25 MG PO CAPS
ORAL_CAPSULE | ORAL | Status: AC
  Filled 2020-12-02: qty 1

## 2020-12-02 MED ORDER — OCTREOTIDE ACETATE 30 MG IM KIT
PACK | INTRAMUSCULAR | Status: AC
  Filled 2020-12-02: qty 1

## 2020-12-02 MED ORDER — ACETAMINOPHEN 325 MG PO TABS
ORAL_TABLET | ORAL | Status: AC
  Filled 2020-12-02: qty 2

## 2020-12-02 MED ORDER — DIPHENHYDRAMINE HCL 25 MG PO CAPS
25.0000 mg | ORAL_CAPSULE | Freq: Once | ORAL | Status: AC
  Administered 2020-12-02: 25 mg via ORAL

## 2020-12-02 MED ORDER — ACETAMINOPHEN 325 MG PO TABS
650.0000 mg | ORAL_TABLET | Freq: Once | ORAL | Status: AC
  Administered 2020-12-02: 650 mg via ORAL

## 2020-12-02 MED ORDER — HEPARIN SOD (PORK) LOCK FLUSH 100 UNIT/ML IV SOLN
500.0000 [IU] | Freq: Once | INTRAVENOUS | Status: AC
  Administered 2020-12-02: 500 [IU] via INTRAVENOUS

## 2020-12-02 MED ORDER — SODIUM CHLORIDE 0.9% IV SOLUTION
250.0000 mL | Freq: Once | INTRAVENOUS | Status: AC
  Administered 2020-12-02: 250 mL via INTRAVENOUS

## 2020-12-02 NOTE — Progress Notes (Signed)
Patient will receive 1 unit of PRBC today per Dr. Delton Coombes.  Patient tolerated blood transfusion today without incidence.  He remained stable during transfusion.  He was discharged in stable condition in his motorized scooter to self.  He will follow up as scheduled next week.

## 2020-12-02 NOTE — Progress Notes (Signed)
Patient is here today for possible blood transfusion.  He states that he is feeling his usual.  Nothing worse than his normal but he states he has trouble breathing. He gets short of breath with activity.  He denies any pain at this time.

## 2020-12-02 NOTE — Patient Instructions (Addendum)
[place] [facility] Discharge Instructions  You were here today for labs and blood transfusion.  Your hemoglobin dropped down to 7.6 and your platelets dropped to 42.  We will bring you back next week for labs and possible blood again.  Please see your schedule for new appointments.     Thank you for choosing [facility] to provide your oncology and hematology care.  To afford each patient quality time with our provider, please arrive at least 15 minutes before your scheduled appointment time.   If you have a lab appointment with the [facility] please come in thru the Main Entrance and check in at the main information desk.  You need to re-schedule your appointment should you arrive 10 or more minutes late.  We strive to give you quality time with our providers, and arriving late affects you and other patients whose appointments are after yours.  Also, if you no show three or more times for appointments you may be dismissed from the clinic at the providers discretion.     Again, thank you for choosing [facility].  Our hope is that these requests will decrease the amount of time that you wait before being seen by our physicians.       _____________________________________________________________  Should you have questions after your visit to [facility], please contact our office at [phone] and follow the prompts.  Our office hours are 8:00 a.m. and 4:30 p.m. Monday - Friday.  Please note that voicemails left after 4:00 p.m. may not be returned until the following business day.  We are closed weekends and major holidays.  You do have access to a nurse 24-7, just call the main number to the clinic [phone] and do not press any options, hold on the line and a nurse will answer the phone.    For prescription refill requests, have your pharmacy contact our office and allow 72 hours.    Due to  Covid, you will need to wear a mask upon entering the hospital. If you do not have a mask, a mask will be given to you at the Main Entrance upon arrival. For doctor visits, patients may have 1 support person age 23 or older with them. For treatment visits, patients can not have anyone with them due to social distancing guidelines and our immunocompromised population.

## 2020-12-03 ENCOUNTER — Encounter (HOSPITAL_COMMUNITY): Payer: Medicare Other

## 2020-12-03 ENCOUNTER — Other Ambulatory Visit (HOSPITAL_COMMUNITY): Payer: Medicare Other

## 2020-12-03 LAB — TYPE AND SCREEN
ABO/RH(D): B NEG
Antibody Screen: NEGATIVE
Unit division: 0

## 2020-12-03 LAB — BPAM RBC
Blood Product Expiration Date: 202208062359
ISSUE DATE / TIME: 202207181158
Unit Type and Rh: 1700

## 2020-12-09 ENCOUNTER — Inpatient Hospital Stay (HOSPITAL_COMMUNITY): Payer: Medicare Other

## 2020-12-09 ENCOUNTER — Encounter (HOSPITAL_COMMUNITY): Payer: Self-pay

## 2020-12-09 ENCOUNTER — Other Ambulatory Visit: Payer: Self-pay

## 2020-12-09 DIAGNOSIS — D469 Myelodysplastic syndrome, unspecified: Secondary | ICD-10-CM

## 2020-12-09 DIAGNOSIS — D61818 Other pancytopenia: Secondary | ICD-10-CM | POA: Diagnosis not present

## 2020-12-09 LAB — COMPREHENSIVE METABOLIC PANEL
ALT: 15 U/L (ref 0–44)
AST: 19 U/L (ref 15–41)
Albumin: 3.2 g/dL — ABNORMAL LOW (ref 3.5–5.0)
Alkaline Phosphatase: 60 U/L (ref 38–126)
Anion gap: 3 — ABNORMAL LOW (ref 5–15)
BUN: 35 mg/dL — ABNORMAL HIGH (ref 8–23)
CO2: 25 mmol/L (ref 22–32)
Calcium: 9.1 mg/dL (ref 8.9–10.3)
Chloride: 108 mmol/L (ref 98–111)
Creatinine, Ser: 1.67 mg/dL — ABNORMAL HIGH (ref 0.61–1.24)
GFR, Estimated: 39 mL/min — ABNORMAL LOW (ref >60)
Glucose, Bld: 88 mg/dL (ref 70–99)
Potassium: 3.9 mmol/L (ref 3.5–5.1)
Sodium: 136 mmol/L (ref 135–145)
Total Bilirubin: 1.1 mg/dL (ref 0.3–1.2)
Total Protein: 6.4 g/dL — ABNORMAL LOW (ref 6.5–8.1)

## 2020-12-09 LAB — CBC WITH DIFFERENTIAL/PLATELET
Abs Immature Granulocytes: 0.09 10*3/uL — ABNORMAL HIGH (ref 0.00–0.07)
Basophils Absolute: 0 10*3/uL (ref 0.0–0.1)
Basophils Relative: 0 %
Eosinophils Absolute: 0 10*3/uL (ref 0.0–0.5)
Eosinophils Relative: 0 %
HCT: 27.4 % — ABNORMAL LOW (ref 39.0–52.0)
Hemoglobin: 8.5 g/dL — ABNORMAL LOW (ref 13.0–17.0)
Immature Granulocytes: 4 %
Lymphocytes Relative: 21 %
Lymphs Abs: 0.5 10*3/uL — ABNORMAL LOW (ref 0.7–4.0)
MCH: 26.2 pg (ref 26.0–34.0)
MCHC: 31 g/dL (ref 30.0–36.0)
MCV: 84.3 fL (ref 80.0–100.0)
Monocytes Absolute: 0.2 10*3/uL (ref 0.1–1.0)
Monocytes Relative: 8 %
Neutro Abs: 1.7 10*3/uL (ref 1.7–7.7)
Neutrophils Relative %: 67 %
Platelets: 36 10*3/uL — ABNORMAL LOW (ref 150–400)
RBC: 3.25 MIL/uL — ABNORMAL LOW (ref 4.22–5.81)
RDW: 18.4 % — ABNORMAL HIGH (ref 11.5–15.5)
WBC: 2.5 10*3/uL — ABNORMAL LOW (ref 4.0–10.5)
nRBC: 0 % (ref 0.0–0.2)

## 2020-12-09 LAB — SAMPLE TO BLOOD BANK

## 2020-12-09 MED ORDER — HEPARIN SOD (PORK) LOCK FLUSH 100 UNIT/ML IV SOLN
500.0000 [IU] | Freq: Once | INTRAVENOUS | Status: AC
  Administered 2020-12-09: 500 [IU] via INTRAVENOUS

## 2020-12-09 MED ORDER — SODIUM CHLORIDE 0.9% FLUSH
10.0000 mL | Freq: Once | INTRAVENOUS | Status: AC
  Administered 2020-12-09: 10 mL via INTRAVENOUS

## 2020-12-09 NOTE — Patient Instructions (Signed)
[facility]  Discharge Instructions: Thank you for choosing [facility] to provide your oncology and hematology care.  If you have a lab appointment with the [facility], please come in thru the Main Entrance and check in at the main information desk.  Wear comfortable clothing and clothing appropriate for easy access to any Portacath or PICC line.   We strive to give you quality time with your provider. You may need to reschedule your appointment if you arrive late (15 or more minutes).  Arriving late affects you and other patients whose appointments are after yours.  Also, if you miss three or more appointments without notifying the office, you may be dismissed from the clinic at the provider's discretion.      For prescription refill requests, have your pharmacy contact our office and allow 72 hours for refills to be completed.    Today your port was flushed and labs were drawn. No blood needed today Hemoglobin 8.5. Return next week as scheduled.   To help prevent nausea and vomiting after your treatment, we encourage you to take your nausea medication as directed.  BELOW ARE SYMPTOMS THAT SHOULD BE REPORTED IMMEDIATELY: *FEVER GREATER THAN 100.4 F (38 C) OR HIGHER *CHILLS OR SWEATING *NAUSEA AND VOMITING THAT IS NOT CONTROLLED WITH YOUR NAUSEA MEDICATION *UNUSUAL SHORTNESS OF BREATH *UNUSUAL BRUISING OR BLEEDING *URINARY PROBLEMS (pain or burning when urinating, or frequent urination) *BOWEL PROBLEMS (unusual diarrhea, constipation, pain near the anus) TENDERNESS IN MOUTH AND THROAT WITH OR WITHOUT PRESENCE OF ULCERS (sore throat, sores in mouth, or a toothache) UNUSUAL RASH, SWELLING OR PAIN  UNUSUAL VAGINAL DISCHARGE OR ITCHING   Items with * indicate a potential emergency and should be followed up as soon as possible or go to the Emergency Department if any problems should occur.  Please show the CHEMOTHERAPY ALERT CARD or IMMUNOTHERAPY ALERT CARD at  check-in to the Emergency Department and triage nurse.  Should you have questions after your visit or need to cancel or reschedule your appointment, please contact [facility] [phone]  and follow the prompts.  Office hours are 8:00 a.m. to 4:30 p.m. Monday - Friday. Please note that voicemails left after 4:00 p.m. may not be returned until the following business day.  We are closed weekends and major holidays. You have access to a nurse at all times for urgent questions. Please call the main number to the clinic [phone] and follow the prompts.  For any non-urgent questions, you may also contact your provider using MyChart. We now offer e-Visits for anyone 50 and older to request care online for non-urgent symptoms. For details visit [email or web address].   Also download the MyChart app! Go to the app store, search "MyChart", open the app, select [place], and log in with your MyChart username and password.  Due to Covid, a mask is required upon entering the hospital/clinic. If you do not have a mask, one will be given to you upon arrival. For doctor visits, patients may have 1 support person aged 5 or older with them. For treatment visits, patients cannot have anyone with them due to current Covid guidelines and our immunocompromised population.

## 2020-12-09 NOTE — Progress Notes (Signed)
Patient presents today for possible blood product infusion, port flushed and labs drawn. Bruising assessed at port site, no complications when flushing port. Hemoglobin 8.5 no blood needed today. Platelets 36, asymptomatic. Per Dr. Philipp Ovens to discharge home.  Port flushed with good blood return noted.  Gauze and paper tape applied and patient discharged in satisfactory condition. VVS stable with no signs or symptoms of distressed noted.

## 2020-12-14 NOTE — Progress Notes (Signed)
[place], [place] [address]   CLINIC:  Medical Oncology/Hematology  PCP:  Sharilyn Sites, [place] / [place] [address] [phone]   REASON FOR VISIT:  Follow-up for MDS  PRIOR THERAPY: Aranesp, Retacrit, intermittent PRBC transfusions  NGS Results: not done  CURRENT THERAPY: Azacitidine (initiated on 09/16/2020)  BRIEF ONCOLOGIC HISTORY:  Oncology History  Myelodysplastic syndrome ([facility])  09/16/2020 -  Chemotherapy    Patient is on Treatment Plan: MYELODYSPLASIA  AZACITIDINE IV D1-7 Q28D         CANCER STAGING: Cancer Staging No matching staging information was found for the patient.  INTERVAL HISTORY:  [name], a 87 y.o. male, returns for routine follow-up and consideration for next cycle of chemotherapy. Beni was last seen on 10/01/20.  Due for cycle #4 of Azacitidine  today.   Overall, he tells me he has been feeling pretty well. He was hospitalized in June due to Covid-19. He denies fever, cough, CP, light-headedness, and n/v/d, and reports stable appetite, fatigue, and SOB. He reports that he is drinking sufficient fluids. He spends most of the day sitting down and he is capable of completing all of his typical home activities.   Overall, he feels ready for next cycle of chemo today.   REVIEW OF SYSTEMS:  Review of Systems  Constitutional:  Positive for appetite change (25%) and fatigue (depleted). Negative for fever.  Respiratory:  Positive for shortness of breath (w/ exertion). Negative for cough.   Cardiovascular:  Negative for chest pain.  Gastrointestinal:  Negative for diarrhea, nausea and vomiting.  Neurological:  Negative for light-headedness.  Psychiatric/Behavioral:  Positive for depression. The patient is nervous/anxious.   All other systems reviewed and are negative.  PAST MEDICAL/SURGICAL HISTORY:  Past Medical History:  Diagnosis Date   Bone cancer ([facility])    Chronic kidney  insufficiency    COPD (chronic obstructive pulmonary disease) ([facility])    GERD (gastroesophageal reflux disease)    Pulmonary nodule    Past Surgical History:  Procedure Laterality Date   BACK SURGERY     CATARACT EXTRACTION W/PHACO Right 03/09/2016   Procedure: CATARACT EXTRACTION PHACO AND INTRAOCULAR LENS PLACEMENT RIGHT EYE CDE=8.58;  Surgeon: Tonny Branch, MD;  Location: [facility];  Service: Ophthalmology;  Laterality: Right;  right   CATARACT EXTRACTION W/PHACO Left 04/13/2016   Procedure: CATARACT EXTRACTION PHACO AND INTRAOCULAR LENS PLACEMENT (IOC);  Surgeon: Tonny Branch, MD;  Location: [facility];  Service: Ophthalmology;  Laterality: Left;  CDE: 8.08   KYPHOPLASTY Bilateral 06/09/2019   Procedure: T11 KYPHOPLASTY;  Surgeon: Consuella Lose, MD;  Location: [facility];  Service: Neurosurgery;  Laterality: Bilateral;   None to Date  10/01/15   PORTACATH PLACEMENT Left 10/11/2020   Procedure: INSERTION PORT-A-CATH (attached catheter in left internal jugular);  Surgeon: Virl Cagey, MD;  Location: [facility];  Service: General;  Laterality: Left;    SOCIAL HISTORY:  Social History   Socioeconomic History   Marital status: Widowed    Spouse name: Not on file   Number of children: Not on file   Years of education: Not on file   Highest education level: Not on file  Occupational History   Not on file  Tobacco Use   Smoking status: Former    Years: 15.00    Types: Cigarettes, Cigars    Quit date: 10/01/1990    Years since quitting: 30.2   Smokeless tobacco: Never   Tobacco comments:  Quit x 25-30 years; 2 cigars daily when smoked  Vaping Use   Vaping Use: Never used  Substance and Sexual Activity   Alcohol use: Not Currently    Alcohol/week: 0.0 standard drinks   Drug use: No   Sexual activity: Never    Birth control/protection: None  Other Topics Concern   Not on file  Social History Narrative   Not on file   Social Determinants of Health   Financial Resource Strain: High  Risk   Difficulty of Paying Living Expenses: Hard  Food Insecurity: No Food Insecurity   Worried About Running Out of Food in the Last Year: Never true   Ran Out of Food in the Last Year: Never true  Transportation Needs: No Transportation Needs   Lack of Transportation (Medical): No   Lack of Transportation (Non-Medical): No  Physical Activity: Inactive   Days of Exercise per Week: 0 days   Minutes of Exercise per Session: 0 min  Stress: No Stress Concern Present   Feeling of Stress : Not at all  Social Connections: Moderately Integrated   Frequency of Communication with Friends and Family: More than three times a week   Frequency of Social Gatherings with Friends and Family: More than three times a week   Attends Religious Services: More than 4 times per year   Active Member of [employer] or Organizations: Yes   Attends Archivist Meetings: More than 4 times per year   Marital Status: Widowed  Human resources officer Violence: Not At Risk   Fear of Current or Ex-Partner: No   Emotionally Abused: No   Physically Abused: No   Sexually Abused: No    FAMILY HISTORY:  Family History  Problem Relation Age of Onset   Kidney disease Mother    Kidney disease Sister    Colon cancer Neg Hx    Gastric cancer Neg Hx    Esophageal cancer Neg Hx     CURRENT MEDICATIONS:  Current Outpatient Medications  Medication Sig Dispense Refill   acetaminophen (TYLENOL) 325 MG tablet Take 2 tablets (650 mg total) by mouth every 6 (six) hours as needed for mild pain, fever or headache (or Fever >/= 101). 30 tablet 2   albuterol (VENTOLIN HFA) 108 (90 Base) MCG/ACT inhaler Inhale 2 puffs into the lungs every 4 (four) hours as needed for wheezing or shortness of breath. 18 g 2   dronabinol (MARINOL) 2.5 MG capsule Take 1 capsule (2.5 mg total) by mouth 2 (two) times daily before a meal. 60 capsule 2   ondansetron (ZOFRAN) 4 MG tablet Take 1 tablet (4 mg total) by mouth every 6 (six) hours as needed for  nausea or vomiting. 12 tablet 0   pantoprazole (PROTONIX) 40 MG tablet Take 40 mg by mouth daily. Pt no longer taking     prochlorperazine (COMPAZINE) 10 MG tablet Take 1 tablet (10 mg total) by mouth every 6 (six) hours as needed for nausea or vomiting. 30 tablet 3   tamsulosin (FLOMAX) 0.4 MG CAPS capsule Take 1 capsule (0.4 mg total) by mouth daily after supper. 30 capsule 2   tiotropium (SPIRIVA HANDIHALER) 18 MCG inhalation capsule Place 1 capsule (18 mcg total) into inhaler and inhale daily. 30 capsule 12   No current facility-administered medications for this visit.    ALLERGIES:  No Known Allergies  PHYSICAL EXAM:  Performance status (ECOG): 2 - Symptomatic, <50% confined to bed  Vitals:   12/16/20 0810  BP: 93/60  Pulse: 87  Resp: 20  Temp: (!) 96.8 F (36 C)  SpO2: 97%   Wt Readings from Last 3 Encounters:  12/16/20 130 lb (59 kg)  11/25/20 128 lb 3.2 oz (58.2 kg)  11/19/20 127 lb 3.2 oz (57.7 kg)   Physical Exam Vitals reviewed.  Constitutional:      Appearance: Normal appearance.  Cardiovascular:     Rate and Rhythm: Normal rate and regular rhythm.     Pulses: Normal pulses.     Heart sounds: Normal heart sounds.  Pulmonary:     Effort: Pulmonary effort is normal.     Breath sounds: Normal breath sounds.  Neurological:     General: No focal deficit present.     Mental Status: He is alert and oriented to person, place, and time.  Psychiatric:        Mood and Affect: Mood normal.        Behavior: Behavior normal.    LABORATORY DATA:  I have reviewed the labs as listed.  CBC Latest Ref Rng & Units 12/09/2020 12/02/2020 11/25/2020  WBC 4.0 - 10.5 K/uL 2.5(L) 3.1(L) 2.5(L)  Hemoglobin 13.0 - 17.0 g/dL 8.5(L) 7.6(L) 8.4(L)  Hematocrit 39.0 - 52.0 % 27.4(L) 24.8(L) 26.8(L)  Platelets 150 - 400 K/uL 36(L) 42(L) 86(L)   CMP Latest Ref Rng & Units 12/09/2020 11/25/2020 11/19/2020  Glucose 70 - 99 mg/dL 88 108(H) 116(H)  BUN 8 - 23 mg/dL 35(H) 40(H) 29(H)   Creatinine 0.61 - 1.24 mg/dL 1.67(H) 1.52(H) 1.77(H)  Sodium 135 - 145 mmol/L 136 137 137  Potassium 3.5 - 5.1 mmol/L 3.9 3.8 3.6  Chloride 98 - 111 mmol/L 108 106 104  CO2 22 - 32 mmol/L _0 Calcium 8.9 - 10.3 mg/dL 9.1 9.1 9.2  Total Protein 6.5 - 8.1 g/dL 6.4(L) 6.0(L) 6.7  Total Bilirubin 0.3 - 1.2 mg/dL 1.1 0.6 0.8  Alkaline Phos 38 - 126 U/L 60 61 67  AST 15 - 41 U/L 19 14(L) 20  ALT 0 - 44 U/L _1 DIAGNOSTIC IMAGING:  I have independently reviewed the scans and discussed with the patient. No results found.   ASSESSMENT:  1.  Myelodysplastic syndrome with pancytopenia: - Bone marrow biopsy on 12/08/2016 in [place], [place] showed normocellular marrow with trilineage hematopoiesis with erythroid predominance and dysmegakaryopoiesis.  Blasts were not increased.  Cytogenetics showed 46, XY,del(12)(p.2p13)[8]/46,XY[12].  FISH was positive for 12p-. - Bone marrow biopsy and testing in 2018 was consistent with myelodysplastic syndrome with single lineage dysplasia (MDS-SLD),IPSS-R score of 2, consistent with low risk category -  Repeat bone marrow biopsy performed on 08/13/2019 by Dr. Delton Coombes with the following results: Hypercellular bone marrow with dyspoietic changes Bone marrow aspirate with 5% blastic cells of granulocytic precursors Flow pathology with 3% blasts Peripheral blood with 2% circulating blasts Cytogenetic analysis show del(12p) and 13 of the examined cells with remaining 7 cells showing normal chromosome Karyotype: 46,XY,del(12)(p13p11.2)[13]/46,XY[7] FISH analysis was normal - Based on most recent bone marrow biopsy (08/04/2020), patient's IPSS-R is 5, showing that he has progressed to the high risk category - He has required multiple blood transfusions over the past 6 months, most recently on 08/09/2020 (Hgb 7.1) - No improvement on monthly Aranesp or on weekly Retacrit injections (started on 07/10/2020 - discontinued 09/04/2020) - Stool occult blood  cards x3 were negative -Azacitidine (50 mg/m2) x5 days started on 09/16/2020.   PLAN:  1.  Myelodysplastic syndrome (high risk) with pancytopenia - Cycle 3 of azacitidine  was on 11/19/2020. - He received 1 unit of PRBC on 12/02/2020. - He reports decrease in energy levels and dyspnea on exertion. - Denies any nausea vomiting or diarrhea. - Reviewed his labs today which showed normal LDH and LFTs.  CBC shows hemoglobin 7.8.  Platelet count is low at 42.  His prior platelet count is between 70-80 on day 1 of treatment.  White count is low at 2.5 with ANC of 1.7. - Would delay his treatment by 1 week.  However he wants to go on a camping trip for 4 days next week. - We will reevaluate him on 12/30/2020 for his next cycle of treatment.  He will receive 1 unit of PRBC today. - I will consider maintaining the same dose of azacitidine 50 mg per metered square as he is feeling tired.  I will also plan to check his CBC next week for possible transfusion.   2. CKD stage 3b - Creatinine today is at his baseline of 1.64.  We will closely monitor.  Continue aggressive hydration.   Orders placed this encounter:  No orders of the defined types were placed in this encounter.    Derek Jack, MD [place] [phone]   I, Thana Ates, am acting as a scribe for Dr. Derek Jack.  I, Derek Jack MD, have reviewed the above documentation for accuracy and completeness, and I agree with the above.

## 2020-12-16 ENCOUNTER — Inpatient Hospital Stay (HOSPITAL_BASED_OUTPATIENT_CLINIC_OR_DEPARTMENT_OTHER): Payer: Medicare Other | Admitting: Hematology

## 2020-12-16 ENCOUNTER — Inpatient Hospital Stay (HOSPITAL_COMMUNITY): Payer: Medicare Other | Attending: Hematology

## 2020-12-16 ENCOUNTER — Encounter (HOSPITAL_COMMUNITY): Payer: Self-pay | Admitting: Hematology

## 2020-12-16 ENCOUNTER — Inpatient Hospital Stay (HOSPITAL_COMMUNITY): Payer: Medicare Other

## 2020-12-16 ENCOUNTER — Other Ambulatory Visit: Payer: Self-pay

## 2020-12-16 VITALS — BP 97/65 | HR 66 | Temp 96.6°F | Resp 16

## 2020-12-16 VITALS — BP 93/60 | HR 87 | Temp 96.8°F | Resp 20 | Wt 130.0 lb

## 2020-12-16 DIAGNOSIS — D469 Myelodysplastic syndrome, unspecified: Secondary | ICD-10-CM

## 2020-12-16 DIAGNOSIS — N1832 Chronic kidney disease, stage 3b: Secondary | ICD-10-CM | POA: Insufficient documentation

## 2020-12-16 DIAGNOSIS — D61818 Other pancytopenia: Secondary | ICD-10-CM | POA: Insufficient documentation

## 2020-12-16 DIAGNOSIS — Z79899 Other long term (current) drug therapy: Secondary | ICD-10-CM | POA: Insufficient documentation

## 2020-12-16 DIAGNOSIS — D649 Anemia, unspecified: Secondary | ICD-10-CM

## 2020-12-16 LAB — COMPREHENSIVE METABOLIC PANEL
ALT: 11 U/L (ref 0–44)
AST: 15 U/L (ref 15–41)
Albumin: 3.3 g/dL — ABNORMAL LOW (ref 3.5–5.0)
Alkaline Phosphatase: 59 U/L (ref 38–126)
Anion gap: 5 (ref 5–15)
BUN: 35 mg/dL — ABNORMAL HIGH (ref 8–23)
CO2: 24 mmol/L (ref 22–32)
Calcium: 9.2 mg/dL (ref 8.9–10.3)
Chloride: 107 mmol/L (ref 98–111)
Creatinine, Ser: 1.64 mg/dL — ABNORMAL HIGH (ref 0.61–1.24)
GFR, Estimated: 40 mL/min — ABNORMAL LOW (ref >60)
Glucose, Bld: 89 mg/dL (ref 70–99)
Potassium: 3.7 mmol/L (ref 3.5–5.1)
Sodium: 136 mmol/L (ref 135–145)
Total Bilirubin: 0.9 mg/dL (ref 0.3–1.2)
Total Protein: 6.3 g/dL — ABNORMAL LOW (ref 6.5–8.1)

## 2020-12-16 LAB — CBC WITH DIFFERENTIAL/PLATELET
Abs Immature Granulocytes: 0.06 10*3/uL (ref 0.00–0.07)
Basophils Absolute: 0 10*3/uL (ref 0.0–0.1)
Basophils Relative: 0 %
Eosinophils Absolute: 0 10*3/uL (ref 0.0–0.5)
Eosinophils Relative: 0 %
HCT: 25.2 % — ABNORMAL LOW (ref 39.0–52.0)
Hemoglobin: 7.8 g/dL — ABNORMAL LOW (ref 13.0–17.0)
Immature Granulocytes: 2 %
Lymphocytes Relative: 17 %
Lymphs Abs: 0.4 10*3/uL — ABNORMAL LOW (ref 0.7–4.0)
MCH: 25.9 pg — ABNORMAL LOW (ref 26.0–34.0)
MCHC: 31 g/dL (ref 30.0–36.0)
MCV: 83.7 fL (ref 80.0–100.0)
Monocytes Absolute: 0.3 10*3/uL (ref 0.1–1.0)
Monocytes Relative: 10 %
Neutro Abs: 1.7 10*3/uL (ref 1.7–7.7)
Neutrophils Relative %: 71 %
Platelets: 42 10*3/uL — ABNORMAL LOW (ref 150–400)
RBC: 3.01 MIL/uL — ABNORMAL LOW (ref 4.22–5.81)
RDW: 18.1 % — ABNORMAL HIGH (ref 11.5–15.5)
WBC: 2.5 10*3/uL — ABNORMAL LOW (ref 4.0–10.5)
nRBC: 0.8 % — ABNORMAL HIGH (ref 0.0–0.2)

## 2020-12-16 LAB — LACTATE DEHYDROGENASE: LDH: 128 U/L (ref 98–192)

## 2020-12-16 LAB — SAMPLE TO BLOOD BANK

## 2020-12-16 MED ORDER — ACETAMINOPHEN 325 MG PO TABS
ORAL_TABLET | ORAL | Status: AC
  Filled 2020-12-16: qty 2

## 2020-12-16 MED ORDER — ACETAMINOPHEN 325 MG PO TABS
650.0000 mg | ORAL_TABLET | Freq: Once | ORAL | Status: AC
  Administered 2020-12-16: 650 mg via ORAL

## 2020-12-16 MED ORDER — DIPHENHYDRAMINE HCL 25 MG PO CAPS
ORAL_CAPSULE | ORAL | Status: AC
  Filled 2020-12-16: qty 1

## 2020-12-16 MED ORDER — SODIUM CHLORIDE 0.9% FLUSH
10.0000 mL | INTRAVENOUS | Status: AC | PRN
  Administered 2020-12-16: 10 mL

## 2020-12-16 MED ORDER — SODIUM CHLORIDE 0.9% IV SOLUTION
250.0000 mL | Freq: Once | INTRAVENOUS | Status: AC
  Administered 2020-12-16: 250 mL via INTRAVENOUS

## 2020-12-16 MED ORDER — DIPHENHYDRAMINE HCL 25 MG PO CAPS
25.0000 mg | ORAL_CAPSULE | Freq: Once | ORAL | Status: AC
  Administered 2020-12-16: 25 mg via ORAL

## 2020-12-16 MED ORDER — HEPARIN SOD (PORK) LOCK FLUSH 100 UNIT/ML IV SOLN
500.0000 [IU] | Freq: Every day | INTRAVENOUS | Status: AC | PRN
  Administered 2020-12-16: 500 [IU]

## 2020-12-16 NOTE — Patient Instructions (Addendum)
[facility] at [facility] Discharge Instructions  You were seen today by Dr. Katragadda. He went over your recent results. Dr. Katragadda will see you back in 1 month for labs and follow up.   Thank you for choosing [facility] at [facility] to provide your oncology and hematology care.  To afford each patient quality time with our provider, please arrive at least 15 minutes before your scheduled appointment time.   If you have a lab appointment with the Cancer Center please come in thru the Main Entrance and check in at the main information desk  You need to re-schedule your appointment should you arrive 10 or more minutes late.  We strive to give you quality time with our providers, and arriving late affects you and other patients whose appointments are after yours.  Also, if you no show three or more times for appointments you may be dismissed from the clinic at the providers discretion.     Again, thank you for choosing [facility].  Our hope is that these requests will decrease the amount of time that you wait before being seen by our physicians.       _____________________________________________________________  Should you have questions after your visit to [facility], please contact our office at [phone] between the hours of 8:00 a.m. and 4:30 p.m.  Voicemails left after 4:00 p.m. will not be returned until the following business day.  For prescription refill requests, have your pharmacy contact our office and allow 72 hours.    Cancer Center Support Programs:   > Cancer Support Group  2nd Tuesday of the month 1pm-2pm, Journey Room   

## 2020-12-16 NOTE — Patient Instructions (Signed)
[facility]  Discharge Instructions: Thank you for choosing  Cancer Center to provide your oncology and hematology care.  If you have a lab appointment with the Cancer Center, please come in thru the Main Entrance and check in at the main information desk.  Wear comfortable clothing and clothing appropriate for easy access to any Portacath or PICC line.   We strive to give you quality time with your provider. You may need to reschedule your appointment if you arrive late (15 or more minutes).  Arriving late affects you and other patients whose appointments are after yours.  Also, if you miss three or more appointments without notifying the office, you may be dismissed from the clinic at the provider's discretion.      For prescription refill requests, have your pharmacy contact our office and allow 72 hours for refills to be completed.     To help prevent nausea and vomiting after your treatment, we encourage you to take your nausea medication as directed.  BELOW ARE SYMPTOMS THAT SHOULD BE REPORTED IMMEDIATELY: *FEVER GREATER THAN 100.4 F (38 C) OR HIGHER *CHILLS OR SWEATING *NAUSEA AND VOMITING THAT IS NOT CONTROLLED WITH YOUR NAUSEA MEDICATION *UNUSUAL SHORTNESS OF BREATH *UNUSUAL BRUISING OR BLEEDING *URINARY PROBLEMS (pain or burning when urinating, or frequent urination) *BOWEL PROBLEMS (unusual diarrhea, constipation, pain near the anus) TENDERNESS IN MOUTH AND THROAT WITH OR WITHOUT PRESENCE OF ULCERS (sore throat, sores in mouth, or a toothache) UNUSUAL RASH, SWELLING OR PAIN  UNUSUAL VAGINAL DISCHARGE OR ITCHING   Items with * indicate a potential emergency and should be followed up as soon as possible or go to the Emergency Department if any problems should occur.  Should you have questions after your visit or need to cancel or reschedule your appointment, please contact [facility] [phone]  and follow the prompts.  Office hours are 8:00  a.m. to 4:30 p.m. Monday - Friday. Please note that voicemails left after 4:00 p.m. may not be returned until the following business day.  We are closed weekends and major holidays. You have access to a nurse at all times for urgent questions. Please call the main number to the clinic [phone] and follow the prompts.  For any non-urgent questions, you may also contact your provider using MyChart. We now offer e-Visits for anyone 18 and older to request care online for non-urgent symptoms. For details visit [email or web address].   Also download the MyChart app! Go to the app store, search "MyChart", open the app, select , and log in with your MyChart username and password.  Due to Covid, a mask is required upon entering the hospital/clinic. If you do not have a mask, one will be given to you upon arrival. For doctor visits, patients may have 1 support person aged 18 or older with them. For treatment visits, patients cannot have anyone with them due to current Covid guidelines and our immunocompromised population.  

## 2020-12-16 NOTE — Progress Notes (Signed)
Patient is here today for Vidaza infusion.  Treatment will be held this week per Dr. Delton Coombes.  Platelets were 42 and hemoglobin was 7.8 today.  Patient will receive one unit of PRBC today per Dr. Delton Coombes.  Patient will return next week for labs and possible blood transfusion.  He will resume treatment on 12/30/2020.    Patient completed one unit of PRBC with no complaints.  Vital signs were stable at discharge.  Patient left in satisfactory condition via motorized wheelchair.  Follow up as scheduled.

## 2020-12-17 ENCOUNTER — Ambulatory Visit (HOSPITAL_COMMUNITY): Payer: Medicare Other

## 2020-12-17 LAB — TYPE AND SCREEN
ABO/RH(D): B NEG
Antibody Screen: NEGATIVE
Unit division: 0
Unit division: 0

## 2020-12-17 LAB — BPAM RBC
Blood Product Expiration Date: 202208022359
Blood Product Expiration Date: 202208282359
ISSUE DATE / TIME: 202208011242
Unit Type and Rh: 1700
Unit Type and Rh: 9500

## 2020-12-18 ENCOUNTER — Ambulatory Visit (HOSPITAL_COMMUNITY): Payer: Medicare Other

## 2020-12-19 ENCOUNTER — Ambulatory Visit (HOSPITAL_COMMUNITY): Payer: Medicare Other

## 2020-12-20 ENCOUNTER — Ambulatory Visit (HOSPITAL_COMMUNITY): Payer: Medicare Other

## 2020-12-23 ENCOUNTER — Inpatient Hospital Stay (HOSPITAL_COMMUNITY): Payer: Medicare Other

## 2020-12-23 ENCOUNTER — Other Ambulatory Visit: Payer: Self-pay

## 2020-12-23 ENCOUNTER — Encounter (HOSPITAL_COMMUNITY): Payer: Self-pay

## 2020-12-23 DIAGNOSIS — D61818 Other pancytopenia: Secondary | ICD-10-CM | POA: Diagnosis not present

## 2020-12-23 DIAGNOSIS — N1832 Chronic kidney disease, stage 3b: Secondary | ICD-10-CM | POA: Diagnosis not present

## 2020-12-23 DIAGNOSIS — D469 Myelodysplastic syndrome, unspecified: Secondary | ICD-10-CM

## 2020-12-23 DIAGNOSIS — Z79899 Other long term (current) drug therapy: Secondary | ICD-10-CM | POA: Diagnosis not present

## 2020-12-23 LAB — CBC WITH DIFFERENTIAL/PLATELET
Abs Immature Granulocytes: 0.08 10*3/uL — ABNORMAL HIGH (ref 0.00–0.07)
Basophils Absolute: 0 10*3/uL (ref 0.0–0.1)
Basophils Relative: 0 %
Eosinophils Absolute: 0 10*3/uL (ref 0.0–0.5)
Eosinophils Relative: 0 %
HCT: 27.2 % — ABNORMAL LOW (ref 39.0–52.0)
Hemoglobin: 8.4 g/dL — ABNORMAL LOW (ref 13.0–17.0)
Immature Granulocytes: 3 %
Lymphocytes Relative: 22 %
Lymphs Abs: 0.6 10*3/uL — ABNORMAL LOW (ref 0.7–4.0)
MCH: 25.9 pg — ABNORMAL LOW (ref 26.0–34.0)
MCHC: 30.9 g/dL (ref 30.0–36.0)
MCV: 84 fL (ref 80.0–100.0)
Monocytes Absolute: 0.2 10*3/uL (ref 0.1–1.0)
Monocytes Relative: 7 %
Neutro Abs: 1.9 10*3/uL (ref 1.7–7.7)
Neutrophils Relative %: 68 %
Platelets: 52 10*3/uL — ABNORMAL LOW (ref 150–400)
RBC: 3.24 MIL/uL — ABNORMAL LOW (ref 4.22–5.81)
RDW: 17.2 % — ABNORMAL HIGH (ref 11.5–15.5)
WBC: 2.8 10*3/uL — ABNORMAL LOW (ref 4.0–10.5)
nRBC: 0 % (ref 0.0–0.2)

## 2020-12-23 LAB — SAMPLE TO BLOOD BANK

## 2020-12-23 MED ORDER — SODIUM CHLORIDE 0.9% FLUSH
10.0000 mL | INTRAVENOUS | Status: DC | PRN
  Administered 2020-12-23: 10 mL via INTRAVENOUS

## 2020-12-23 MED ORDER — HEPARIN SOD (PORK) LOCK FLUSH 100 UNIT/ML IV SOLN
500.0000 [IU] | Freq: Once | INTRAVENOUS | Status: AC
  Administered 2020-12-23: 500 [IU] via INTRAVENOUS

## 2020-12-23 NOTE — Progress Notes (Signed)
Vital signs stable. No complaints at this time. Discharged from clinic via motorized chair stable condition. Alert and oriented x 3. F/U with [facility] as scheduled.

## 2020-12-23 NOTE — Progress Notes (Signed)
Patient presents today for lab work and possible blood products.  Vitals signs stable. Patient has no complaints of any changes since his last visit. MAR reviewed and updated.   Reported Platelets 44 and HGB 8.2 to Dr. Delton Coombes via secure chat. Message received to discharge patient . No blood products or platelets today.

## 2020-12-23 NOTE — Patient Instructions (Signed)
[facility]  Discharge Instructions: Thank you for choosing [facility] to provide your oncology and hematology care.  If you have a lab appointment with the [facility], please come in thru the Main Entrance and check in at the main information desk.  Wear comfortable clothing and clothing appropriate for easy access to any Portacath or PICC line.   We strive to give you quality time with your provider. You may need to reschedule your appointment if you arrive late (15 or more minutes).  Arriving late affects you and other patients whose appointments are after yours.  Also, if you miss three or more appointments without notifying the office, you may be dismissed from the clinic at the provider's discretion.      For prescription refill requests, have your pharmacy contact our office and allow 72 hours for refills to be completed.    Today you received lab work.    BELOW ARE SYMPTOMS THAT SHOULD BE REPORTED IMMEDIATELY: *FEVER GREATER THAN 100.4 F (38 C) OR HIGHER *CHILLS OR SWEATING *NAUSEA AND VOMITING THAT IS NOT CONTROLLED WITH YOUR NAUSEA MEDICATION *UNUSUAL SHORTNESS OF BREATH *UNUSUAL BRUISING OR BLEEDING *URINARY PROBLEMS (pain or burning when urinating, or frequent urination) *BOWEL PROBLEMS (unusual diarrhea, constipation, pain near the anus) TENDERNESS IN MOUTH AND THROAT WITH OR WITHOUT PRESENCE OF ULCERS (sore throat, sores in mouth, or a toothache) UNUSUAL RASH, SWELLING OR PAIN  UNUSUAL VAGINAL DISCHARGE OR ITCHING   Items with * indicate a potential emergency and should be followed up as soon as possible or go to the Emergency Department if any problems should occur.  Please show the CHEMOTHERAPY ALERT CARD or IMMUNOTHERAPY ALERT CARD at check-in to the Emergency Department and triage nurse.  Should you have questions after your visit or need to cancel or reschedule your appointment, please contact [facility] [phone]  and  follow the prompts.  Office hours are 8:00 a.m. to 4:30 p.m. Monday - Friday. Please note that voicemails left after 4:00 p.m. may not be returned until the following business day.  We are closed weekends and major holidays. You have access to a nurse at all times for urgent questions. Please call the main number to the clinic [phone] and follow the prompts.  For any non-urgent questions, you may also contact your provider using MyChart. We now offer e-Visits for anyone 2 and older to request care online for non-urgent symptoms. For details visit [email or web address].   Also download the MyChart app! Go to the app store, search "MyChart", open the app, select [facility], and log in with your MyChart username and password.  Due to Covid, a mask is required upon entering the hospital/clinic. If you do not have a mask, one will be given to you upon arrival. For doctor visits, patients may have 1 support person aged 29 or older with them. For treatment visits, patients cannot have anyone with them due to current Covid guidelines and our immunocompromised population.

## 2020-12-30 ENCOUNTER — Ambulatory Visit (HOSPITAL_COMMUNITY): Payer: Medicare Other | Admitting: Hematology

## 2020-12-30 ENCOUNTER — Other Ambulatory Visit (HOSPITAL_COMMUNITY): Payer: Medicare Other

## 2020-12-30 ENCOUNTER — Ambulatory Visit (HOSPITAL_COMMUNITY): Payer: Medicare Other

## 2020-12-30 NOTE — Progress Notes (Signed)
[place], [place] [address]   CLINIC:  Medical Oncology/Hematology  PCP:  Sharilyn Sites, [place] / [place] [address] [phone]   REASON FOR VISIT:  Follow-up for MDS  PRIOR THERAPY: Aranesp, Retacrit, intermittent PRBC transfusions  NGS Results: not done  CURRENT THERAPY:  Azacitidine (initiated on 09/16/2020)  BRIEF ONCOLOGIC HISTORY:  Oncology History  Myelodysplastic syndrome ([facility])  09/16/2020 -  Chemotherapy    Patient is on Treatment Plan: MYELODYSPLASIA  AZACITIDINE IV D1-7 Q28D         CANCER STAGING: Cancer Staging No matching staging information was found for the patient.  INTERVAL HISTORY:  [name], a 87 y.o. male, returns for routine follow-up and consideration for next cycle of chemotherapy. [name] was last seen on 12/16/20.  Due for day #2 cycle #4 of Azacitidine today.   Overall, he tells me he has been feeling pretty well. He reports redness in his right eye, but he denies any associated pain or vision changes.   Overall, he feels ready for next cycle of chemo today.   REVIEW OF SYSTEMS:  Review of Systems  Constitutional:  Positive for appetite change (25%).  Eyes:  Positive for eye problems (R eye redness).  Respiratory:  Positive for shortness of breath.   All other systems reviewed and are negative.  PAST MEDICAL/SURGICAL HISTORY:  Past Medical History:  Diagnosis Date   Bone cancer ([facility])    Chronic kidney insufficiency    COPD (chronic obstructive pulmonary disease) (HCC)    GERD (gastroesophageal reflux disease)    Pulmonary nodule    Past Surgical History:  Procedure Laterality Date   BACK SURGERY     CATARACT EXTRACTION W/PHACO Right 03/09/2016   Procedure: CATARACT EXTRACTION PHACO AND INTRAOCULAR LENS PLACEMENT RIGHT EYE CDE=8.58;  Surgeon: Tonny Branch, MD;  Location: [facility];  Service: Ophthalmology;  Laterality: Right;  right   CATARACT EXTRACTION W/PHACO  Left 04/13/2016   Procedure: CATARACT EXTRACTION PHACO AND INTRAOCULAR LENS PLACEMENT (IOC);  Surgeon: Tonny Branch, MD;  Location: [facility];  Service: Ophthalmology;  Laterality: Left;  CDE: 8.08   KYPHOPLASTY Bilateral 06/09/2019   Procedure: T11 KYPHOPLASTY;  Surgeon: Consuella Lose, MD;  Location: [facility];  Service: Neurosurgery;  Laterality: Bilateral;   None to Date  10/01/15   PORTACATH PLACEMENT Left 10/11/2020   Procedure: INSERTION PORT-A-CATH (attached catheter in left internal jugular);  Surgeon: Virl Cagey, MD;  Location: [facility];  Service: General;  Laterality: Left;    SOCIAL HISTORY:  Social History   Socioeconomic History   Marital status: Widowed    Spouse name: Not on file   Number of children: Not on file   Years of education: Not on file   Highest education level: Not on file  Occupational History   Not on file  Tobacco Use   Smoking status: Former    Years: 15.00    Types: Cigarettes, Cigars    Quit date: 10/01/1990    Years since quitting: 30.2   Smokeless tobacco: Never   Tobacco comments:    Quit x 25-30 years; 2 cigars daily when smoked  Vaping Use   Vaping Use: Never used  Substance and Sexual Activity   Alcohol use: Not Currently    Alcohol/week: 0.0 standard drinks   Drug use: No   Sexual activity: Never    Birth control/protection: None  Other Topics Concern   Not on file  Social History Narrative  Not on file   Social Determinants of Health   Financial Resource Strain: High Risk   Difficulty of Paying Living Expenses: Hard  Food Insecurity: No Food Insecurity   Worried About Running Out of Food in the Last Year: Never true   Ran Out of Food in the Last Year: Never true  Transportation Needs: No Transportation Needs   Lack of Transportation (Medical): No   Lack of Transportation (Non-Medical): No  Physical Activity: Inactive   Days of Exercise per Week: 0 days   Minutes of Exercise per Session: 0 min  Stress: No Stress Concern  Present   Feeling of Stress : Not at all  Social Connections: Moderately Integrated   Frequency of Communication with Friends and Family: More than three times a week   Frequency of Social Gatherings with Friends and Family: More than three times a week   Attends Religious Services: More than 4 times per year   Active Member of [employer] or Organizations: Yes   Attends Archivist Meetings: More than 4 times per year   Marital Status: Widowed  Human resources officer Violence: Not At Risk   Fear of Current or Ex-Partner: No   Emotionally Abused: No   Physically Abused: No   Sexually Abused: No    FAMILY HISTORY:  Family History  Problem Relation Age of Onset   Kidney disease Mother    Kidney disease Sister    Colon cancer Neg Hx    Gastric cancer Neg Hx    Esophageal cancer Neg Hx     CURRENT MEDICATIONS:  Current Outpatient Medications  Medication Sig Dispense Refill   acetaminophen (TYLENOL) 325 MG tablet Take 2 tablets (650 mg total) by mouth every 6 (six) hours as needed for mild pain, fever or headache (or Fever >/= 101). 30 tablet 2   albuterol (VENTOLIN HFA) 108 (90 Base) MCG/ACT inhaler Inhale 2 puffs into the lungs every 4 (four) hours as needed for wheezing or shortness of breath. 18 g 2   dronabinol (MARINOL) 2.5 MG capsule Take 1 capsule (2.5 mg total) by mouth 2 (two) times daily before a meal. 60 capsule 2   ondansetron (ZOFRAN) 4 MG tablet Take 1 tablet (4 mg total) by mouth every 6 (six) hours as needed for nausea or vomiting. 12 tablet 0   pantoprazole (PROTONIX) 40 MG tablet Take 40 mg by mouth daily. Pt no longer taking     prochlorperazine (COMPAZINE) 10 MG tablet Take 1 tablet (10 mg total) by mouth every 6 (six) hours as needed for nausea or vomiting. 30 tablet 3   tamsulosin (FLOMAX) 0.4 MG CAPS capsule Take 1 capsule (0.4 mg total) by mouth daily after supper. 30 capsule 2   tiotropium (SPIRIVA HANDIHALER) 18 MCG inhalation capsule Place 1 capsule (18 mcg  total) into inhaler and inhale daily. 30 capsule 12   No current facility-administered medications for this visit.    ALLERGIES:  No Known Allergies  PHYSICAL EXAM:  Performance status (ECOG): 2 - Symptomatic, <50% confined to bed  There were no vitals filed for this visit. Wt Readings from Last 3 Encounters:  12/16/20 130 lb (59 kg)  11/25/20 128 lb 3.2 oz (58.2 kg)  11/19/20 127 lb 3.2 oz (57.7 kg)   Physical Exam Vitals reviewed.  Constitutional:      Appearance: Normal appearance.     Interventions: Nasal cannula in place.  Eyes:     Conjunctiva/sclera:     Right eye: Hemorrhage (medial subconjunctival hemorrhage)  present.  Cardiovascular:     Rate and Rhythm: Normal rate and regular rhythm.     Pulses: Normal pulses.     Heart sounds: Normal heart sounds.  Pulmonary:     Effort: Pulmonary effort is normal.     Breath sounds: Normal breath sounds.  Neurological:     General: No focal deficit present.     Mental Status: He is alert and oriented to person, place, and time.  Psychiatric:        Mood and Affect: Mood normal.        Behavior: Behavior normal.    LABORATORY DATA:  I have reviewed the labs as listed.  CBC Latest Ref Rng & Units 12/23/2020 12/16/2020 12/09/2020  WBC 4.0 - 10.5 K/uL 2.8(L) 2.5(L) 2.5(L)  Hemoglobin 13.0 - 17.0 g/dL 8.4(L) 7.8(L) 8.5(L)  Hematocrit 39.0 - 52.0 % 27.2(L) 25.2(L) 27.4(L)  Platelets 150 - 400 K/uL 52(L) 42(L) 36(L)   CMP Latest Ref Rng & Units 12/16/2020 12/09/2020 11/25/2020  Glucose 70 - 99 mg/dL 89 88 108(H)  BUN 8 - 23 mg/dL 35(H) 35(H) 40(H)  Creatinine 0.61 - 1.24 mg/dL 1.64(H) 1.67(H) 1.52(H)  Sodium 135 - 145 mmol/L 136 136 137  Potassium 3.5 - 5.1 mmol/L 3.7 3.9 3.8  Chloride 98 - 111 mmol/L 107 108 106  CO2 22 - 32 mmol/L '24 25 25  ' Calcium 8.9 - 10.3 mg/dL 9.2 9.1 9.1  Total Protein 6.5 - 8.1 g/dL 6.3(L) 6.4(L) 6.0(L)  Total Bilirubin 0.3 - 1.2 mg/dL 0.9 1.1 0.6  Alkaline Phos 38 - 126 U/L 59 60 61  AST 15 - 41 U/L  15 19 14(L)  ALT 0 - 44 U/L '11 15 14    ' DIAGNOSTIC IMAGING:  I have independently reviewed the scans and discussed with the patient. No results found.   ASSESSMENT:  1.  Myelodysplastic syndrome with pancytopenia: - Bone marrow biopsy on 12/08/2016 in [place], [place] showed normocellular marrow with trilineage hematopoiesis with erythroid predominance and dysmegakaryopoiesis.  Blasts were not increased.  Cytogenetics showed 46, XY,del(12)(p.2p13)[8]/46,XY[12].  FISH was positive for 12p-. - Bone marrow biopsy and testing in 2018 was consistent with myelodysplastic syndrome with single lineage dysplasia (MDS-SLD),IPSS-R score of 2, consistent with low risk category -  Repeat bone marrow biopsy performed on 08/13/2019 by Dr. Delton Coombes with the following results: Hypercellular bone marrow with dyspoietic changes Bone marrow aspirate with 5% blastic cells of granulocytic precursors Flow pathology with 3% blasts Peripheral blood with 2% circulating blasts Cytogenetic analysis show del(12p) and 13 of the examined cells with remaining 7 cells showing normal chromosome Karyotype: 46,XY,del(12)(p13p11.2)[13]/46,XY[7] FISH analysis was normal - Based on most recent bone marrow biopsy (08/04/2020), patient's IPSS-R is 5, showing that he has progressed to the high risk category - He has required multiple blood transfusions over the past 6 months, most recently on 08/09/2020 (Hgb 7.1) - No improvement on monthly Aranesp or on weekly Retacrit injections (started on 07/10/2020 - discontinued 09/04/2020) - Stool occult blood cards x3 were negative -Azacitidine (50 mg/m2) x5 days started on 09/16/2020.   PLAN:  1.  Myelodysplastic syndrome (high risk) with pancytopenia - Cycle 3 of azacitidine on 11/19/2020. - He came back from camping trip which he enjoyed. - He developed right eye medial subconjunctival hemorrhage.  No vision changes or pain reported. - Reviewed his labs today which showed platelet count  84.  Hemoglobin is 8.1 and white count improved to 4.9 with ANC of 4000.  LDH was 149.  LFTs  show slightly elevated total bilirubin 1.4. - We will proceed with cycle 4 of azacitidine today.  We will dose at 50 mg per metered square for 5 days. - We will consider extending his next treatment to every 5-6 weeks, if his counts does not recover in 4 weeks. - We will continue to monitor CBC weekly to see if he needs any transfusion.  He does not require any transfusion this week.  He will likely need it next Monday.   2. CKD stage 3b: - Creatinine is slightly elevated at 1.74, baseline around 1.6. - Recommend increased fluid intake.  We will closely monitor.   Orders placed this encounter:  No orders of the defined types were placed in this encounter.    Derek Jack, MD [facility] [phone]   I, Thana Ates, am acting as a scribe for Dr. Derek Jack.  I, Derek Jack MD, have reviewed the above documentation for accuracy and completeness, and I agree with the above.

## 2020-12-31 ENCOUNTER — Inpatient Hospital Stay (HOSPITAL_COMMUNITY): Payer: Medicare Other

## 2020-12-31 ENCOUNTER — Other Ambulatory Visit: Payer: Self-pay

## 2020-12-31 ENCOUNTER — Ambulatory Visit (HOSPITAL_COMMUNITY): Payer: Medicare Other

## 2020-12-31 ENCOUNTER — Inpatient Hospital Stay (HOSPITAL_BASED_OUTPATIENT_CLINIC_OR_DEPARTMENT_OTHER): Payer: Medicare Other | Admitting: Hematology

## 2020-12-31 ENCOUNTER — Encounter (HOSPITAL_COMMUNITY): Payer: Self-pay

## 2020-12-31 VITALS — BP 96/58 | HR 68 | Temp 97.8°F | Resp 18 | Ht 76.0 in | Wt 126.0 lb

## 2020-12-31 VITALS — BP 101/59 | HR 79 | Temp 96.7°F | Resp 19

## 2020-12-31 DIAGNOSIS — D61818 Other pancytopenia: Secondary | ICD-10-CM | POA: Diagnosis not present

## 2020-12-31 DIAGNOSIS — D469 Myelodysplastic syndrome, unspecified: Secondary | ICD-10-CM

## 2020-12-31 DIAGNOSIS — N1832 Chronic kidney disease, stage 3b: Secondary | ICD-10-CM | POA: Diagnosis not present

## 2020-12-31 DIAGNOSIS — Z79899 Other long term (current) drug therapy: Secondary | ICD-10-CM | POA: Diagnosis not present

## 2020-12-31 LAB — SAMPLE TO BLOOD BANK

## 2020-12-31 LAB — LACTATE DEHYDROGENASE: LDH: 149 U/L (ref 98–192)

## 2020-12-31 LAB — CBC WITH DIFFERENTIAL/PLATELET
Abs Immature Granulocytes: 0.08 10*3/uL — ABNORMAL HIGH (ref 0.00–0.07)
Basophils Absolute: 0 10*3/uL (ref 0.0–0.1)
Basophils Relative: 0 %
Eosinophils Absolute: 0 10*3/uL (ref 0.0–0.5)
Eosinophils Relative: 0 %
HCT: 25.5 % — ABNORMAL LOW (ref 39.0–52.0)
Hemoglobin: 8.1 g/dL — ABNORMAL LOW (ref 13.0–17.0)
Immature Granulocytes: 2 %
Lymphocytes Relative: 10 %
Lymphs Abs: 0.5 10*3/uL — ABNORMAL LOW (ref 0.7–4.0)
MCH: 26.3 pg (ref 26.0–34.0)
MCHC: 31.8 g/dL (ref 30.0–36.0)
MCV: 82.8 fL (ref 80.0–100.0)
Monocytes Absolute: 0.3 10*3/uL (ref 0.1–1.0)
Monocytes Relative: 6 %
Neutro Abs: 4 10*3/uL (ref 1.7–7.7)
Neutrophils Relative %: 82 %
Platelets: 84 10*3/uL — ABNORMAL LOW (ref 150–400)
RBC: 3.08 MIL/uL — ABNORMAL LOW (ref 4.22–5.81)
RDW: 17.7 % — ABNORMAL HIGH (ref 11.5–15.5)
WBC: 4.9 10*3/uL (ref 4.0–10.5)
nRBC: 0.4 % — ABNORMAL HIGH (ref 0.0–0.2)

## 2020-12-31 LAB — COMPREHENSIVE METABOLIC PANEL
ALT: 12 U/L (ref 0–44)
AST: 17 U/L (ref 15–41)
Albumin: 3.3 g/dL — ABNORMAL LOW (ref 3.5–5.0)
Alkaline Phosphatase: 66 U/L (ref 38–126)
Anion gap: 6 (ref 5–15)
BUN: 29 mg/dL — ABNORMAL HIGH (ref 8–23)
CO2: 23 mmol/L (ref 22–32)
Calcium: 9.5 mg/dL (ref 8.9–10.3)
Chloride: 107 mmol/L (ref 98–111)
Creatinine, Ser: 1.74 mg/dL — ABNORMAL HIGH (ref 0.61–1.24)
GFR, Estimated: 37 mL/min — ABNORMAL LOW (ref >60)
Glucose, Bld: 91 mg/dL (ref 70–99)
Potassium: 3.5 mmol/L (ref 3.5–5.1)
Sodium: 136 mmol/L (ref 135–145)
Total Bilirubin: 1.4 mg/dL — ABNORMAL HIGH (ref 0.3–1.2)
Total Protein: 6.4 g/dL — ABNORMAL LOW (ref 6.5–8.1)

## 2020-12-31 LAB — MAGNESIUM: Magnesium: 1.8 mg/dL (ref 1.7–2.4)

## 2020-12-31 MED ORDER — HEPARIN SOD (PORK) LOCK FLUSH 100 UNIT/ML IV SOLN
500.0000 [IU] | Freq: Once | INTRAVENOUS | Status: AC | PRN
  Administered 2020-12-31: 500 [IU]

## 2020-12-31 MED ORDER — SODIUM CHLORIDE 0.9% FLUSH
10.0000 mL | INTRAVENOUS | Status: DC | PRN
  Administered 2020-12-31: 10 mL

## 2020-12-31 MED ORDER — SODIUM CHLORIDE 0.9 % IV SOLN: Freq: Once | INTRAVENOUS | Status: AC

## 2020-12-31 MED ORDER — SODIUM CHLORIDE 0.9 % IV SOLN
50.0000 mg/m2 | Freq: Once | INTRAVENOUS | Status: AC
  Administered 2020-12-31: 90 mg via INTRAVENOUS
  Filled 2020-12-31: qty 9

## 2020-12-31 MED ORDER — PALONOSETRON HCL INJECTION 0.25 MG/5ML
0.2500 mg | Freq: Once | INTRAVENOUS | Status: AC
  Administered 2020-12-31: 0.25 mg via INTRAVENOUS
  Filled 2020-12-31: qty 5

## 2020-12-31 MED ORDER — SODIUM CHLORIDE 0.9 % IV SOLN
10.0000 mg | Freq: Once | INTRAVENOUS | Status: AC
  Administered 2020-12-31: 10 mg via INTRAVENOUS
  Filled 2020-12-31: qty 10

## 2020-12-31 NOTE — Progress Notes (Signed)
Patient has been assessed, vital signs and labs have been reviewed by Dr. Katragadda. ANC, Creatinine, LFTs, and Platelets are within treatment parameters per Dr. Katragadda. The patient is good to proceed with treatment at this time. Primary RN and pharmacy aware.  

## 2020-12-31 NOTE — Progress Notes (Signed)
Pt presents today for D1 Vidaza per provider's order. Vital signs stable. Pt voiced no new complaints at this time. Hbg 8.1, platelets 84, creatinine 1.74, and bilirubin 1.4. Okay for treatment.  Will not give blood today, but check again on Monday per Dr. Delton Coombes.  D1 Vidaza given today per MD orders. Tolerated infusion without adverse affects. Vital signs stable. No complaints at this time. Discharged from clinic via motorized chair in stable condition. Alert and oriented x 3. F/U with [facility] as scheduled.

## 2020-12-31 NOTE — Patient Instructions (Signed)
[facility]  Discharge Instructions: Thank you for choosing [facility] to provide your oncology and hematology care.  If you have a lab appointment with the [facility], please come in thru the Main Entrance and check in at the main information desk.  Wear comfortable clothing and clothing appropriate for easy access to any Portacath or PICC line.   We strive to give you quality time with your provider. You may need to reschedule your appointment if you arrive late (15 or more minutes).  Arriving late affects you and other patients whose appointments are after yours.  Also, if you miss three or more appointments without notifying the office, you may be dismissed from the clinic at the provider's discretion.      For prescription refill requests, have your pharmacy contact our office and allow 72 hours for refills to be completed.    Today you received the following chemotherapy and/or immunotherapy agents D1 Vidaza.   To help prevent nausea and vomiting after your treatment, we encourage you to take your nausea medication as directed.  BELOW ARE SYMPTOMS THAT SHOULD BE REPORTED IMMEDIATELY: *FEVER GREATER THAN 100.4 F (38 C) OR HIGHER *CHILLS OR SWEATING *NAUSEA AND VOMITING THAT IS NOT CONTROLLED WITH YOUR NAUSEA MEDICATION *UNUSUAL SHORTNESS OF BREATH *UNUSUAL BRUISING OR BLEEDING *URINARY PROBLEMS (pain or burning when urinating, or frequent urination) *BOWEL PROBLEMS (unusual diarrhea, constipation, pain near the anus) TENDERNESS IN MOUTH AND THROAT WITH OR WITHOUT PRESENCE OF ULCERS (sore throat, sores in mouth, or a toothache) UNUSUAL RASH, SWELLING OR PAIN  UNUSUAL VAGINAL DISCHARGE OR ITCHING   Items with * indicate a potential emergency and should be followed up as soon as possible or go to the Emergency Department if any problems should occur.  Please show the CHEMOTHERAPY ALERT CARD or IMMUNOTHERAPY ALERT CARD at check-in to the Emergency  Department and triage nurse.  Should you have questions after your visit or need to cancel or reschedule your appointment, please contact [facility] [phone]  and follow the prompts.  Office hours are 8:00 a.m. to 4:30 p.m. Monday - Friday. Please note that voicemails left after 4:00 p.m. may not be returned until the following business day.  We are closed weekends and major holidays. You have access to a nurse at all times for urgent questions. Please call the main number to the clinic [phone] and follow the prompts.  For any non-urgent questions, you may also contact your provider using MyChart. We now offer e-Visits for anyone 85 and older to request care online for non-urgent symptoms. For details visit [email or web address].   Also download the MyChart app! Go to the app store, search "MyChart", open the app, select [facility], and log in with your MyChart username and password.  Due to Covid, a mask is required upon entering the hospital/clinic. If you do not have a mask, one will be given to you upon arrival. For doctor visits, patients may have 1 support person aged 85 or older with them. For treatment visits, patients cannot have anyone with them due to current Covid guidelines and our immunocompromised population.

## 2020-12-31 NOTE — Patient Instructions (Addendum)
[facility] Discharge Instructions  You were seen today by Dr. Katragadda. He went over your recent results, and you received your treatment. Dr. Katragadda will see you back in 1 month for labs and follow up.   Thank you for choosing [facility] to provide your oncology and hematology care.  To afford each patient quality time with our provider, please arrive at least 15 minutes before your scheduled appointment time.   If you have a lab appointment with the Cancer Center please come in thru the Main Entrance and check in at the main information desk  You need to re-schedule your appointment should you arrive 10 or more minutes late.  We strive to give you quality time with our providers, and arriving late affects you and other patients whose appointments are after yours.  Also, if you no show three or more times for appointments you may be dismissed from the clinic at the providers discretion.     Again, thank you for choosing [facility].  Our hope is that these requests will decrease the amount of time that you wait before being seen by our physicians.       _____________________________________________________________  Should you have questions after your visit to [facility], please contact our office at [phone] between the hours of 8:00 a.m. and 4:30 p.m.  Voicemails left after 4:00 p.m. will not be returned until the following business day.  For prescription refill requests, have your pharmacy contact our office and allow 72 hours.    Cancer Center Support Programs:   > Cancer Support Group  2nd Tuesday of the month 1pm-2pm, Journey Room   

## 2021-01-01 ENCOUNTER — Inpatient Hospital Stay (HOSPITAL_COMMUNITY): Payer: Medicare Other

## 2021-01-01 VITALS — BP 95/68 | HR 74 | Temp 97.6°F | Resp 18

## 2021-01-01 DIAGNOSIS — D469 Myelodysplastic syndrome, unspecified: Secondary | ICD-10-CM

## 2021-01-01 DIAGNOSIS — Z79899 Other long term (current) drug therapy: Secondary | ICD-10-CM | POA: Diagnosis not present

## 2021-01-01 DIAGNOSIS — D61818 Other pancytopenia: Secondary | ICD-10-CM | POA: Diagnosis not present

## 2021-01-01 DIAGNOSIS — N1832 Chronic kidney disease, stage 3b: Secondary | ICD-10-CM | POA: Diagnosis not present

## 2021-01-01 MED ORDER — SODIUM CHLORIDE 0.9 % IV SOLN
10.0000 mg | Freq: Once | INTRAVENOUS | Status: AC
  Administered 2021-01-01: 10 mg via INTRAVENOUS
  Filled 2021-01-01: qty 10

## 2021-01-01 MED ORDER — SODIUM CHLORIDE 0.9 % IV SOLN
50.0000 mg/m2 | Freq: Once | INTRAVENOUS | Status: AC
  Administered 2021-01-01: 90 mg via INTRAVENOUS
  Filled 2021-01-01: qty 9

## 2021-01-01 MED ORDER — HEPARIN SOD (PORK) LOCK FLUSH 100 UNIT/ML IV SOLN
500.0000 [IU] | Freq: Once | INTRAVENOUS | Status: AC | PRN
  Administered 2021-01-01: 500 [IU]

## 2021-01-01 MED ORDER — SODIUM CHLORIDE 0.9 % IV SOLN: Freq: Once | INTRAVENOUS | Status: AC

## 2021-01-01 MED ORDER — SODIUM CHLORIDE 0.9% FLUSH
10.0000 mL | INTRAVENOUS | Status: DC | PRN
Start: 2021-01-01 — End: 2021-01-01
  Administered 2021-01-01: 10 mL

## 2021-01-01 NOTE — Progress Notes (Signed)
Pt presents today for D2 of Vidaza per provider's order. Vital signs stable for treatment. Pt voiced no new complaints at this time.  D2 Vidaza given today per MD orders. Tolerated infusion without adverse affects. Vital signs stable. No complaints at this time. Discharged from clinic in motorized chair in stable condition. Alert and oriented x 3. F/U with [facility] as scheduled.

## 2021-01-01 NOTE — Patient Instructions (Signed)
[facility]  Discharge Instructions: Thank you for choosing [facility] to provide your oncology and hematology care.  If you have a lab appointment with the [facility], please come in thru the Main Entrance and check in at the main information desk.  Wear comfortable clothing and clothing appropriate for easy access to any Portacath or PICC line.   We strive to give you quality time with your provider. You may need to reschedule your appointment if you arrive late (15 or more minutes).  Arriving late affects you and other patients whose appointments are after yours.  Also, if you miss three or more appointments without notifying the office, you may be dismissed from the clinic at the provider's discretion.      For prescription refill requests, have your pharmacy contact our office and allow 72 hours for refills to be completed.    Today you received the following chemotherapy and/or immunotherapy agents D2 Vidaza.   To help prevent nausea and vomiting after your treatment, we encourage you to take your nausea medication as directed.  BELOW ARE SYMPTOMS THAT SHOULD BE REPORTED IMMEDIATELY: *FEVER GREATER THAN 100.4 F (38 C) OR HIGHER *CHILLS OR SWEATING *NAUSEA AND VOMITING THAT IS NOT CONTROLLED WITH YOUR NAUSEA MEDICATION *UNUSUAL SHORTNESS OF BREATH *UNUSUAL BRUISING OR BLEEDING *URINARY PROBLEMS (pain or burning when urinating, or frequent urination) *BOWEL PROBLEMS (unusual diarrhea, constipation, pain near the anus) TENDERNESS IN MOUTH AND THROAT WITH OR WITHOUT PRESENCE OF ULCERS (sore throat, sores in mouth, or a toothache) UNUSUAL RASH, SWELLING OR PAIN  UNUSUAL VAGINAL DISCHARGE OR ITCHING   Items with * indicate a potential emergency and should be followed up as soon as possible or go to the Emergency Department if any problems should occur.  Please show the CHEMOTHERAPY ALERT CARD or IMMUNOTHERAPY ALERT CARD at check-in to the Emergency  Department and triage nurse.  Should you have questions after your visit or need to cancel or reschedule your appointment, please contact [facility] [phone]  and follow the prompts.  Office hours are 8:00 a.m. to 4:30 p.m. Monday - Friday. Please note that voicemails left after 4:00 p.m. may not be returned until the following business day.  We are closed weekends and major holidays. You have access to a nurse at all times for urgent questions. Please call the main number to the clinic [phone] and follow the prompts.  For any non-urgent questions, you may also contact your provider using MyChart. We now offer e-Visits for anyone 85 and older to request care online for non-urgent symptoms. For details visit [email or web address].   Also download the MyChart app! Go to the app store, search "MyChart", open the app, select [facility], and log in with your MyChart username and password.  Due to Covid, a mask is required upon entering the hospital/clinic. If you do not have a mask, one will be given to you upon arrival. For doctor visits, patients may have 1 support person aged 85 or older with them. For treatment visits, patients cannot have anyone with them due to current Covid guidelines and our immunocompromised population.

## 2021-01-02 ENCOUNTER — Encounter (HOSPITAL_COMMUNITY): Payer: Self-pay

## 2021-01-02 ENCOUNTER — Other Ambulatory Visit: Payer: Self-pay

## 2021-01-02 ENCOUNTER — Inpatient Hospital Stay (HOSPITAL_COMMUNITY): Payer: Medicare Other

## 2021-01-02 VITALS — BP 104/55 | HR 63 | Temp 96.7°F | Resp 18

## 2021-01-02 DIAGNOSIS — D61818 Other pancytopenia: Secondary | ICD-10-CM | POA: Diagnosis not present

## 2021-01-02 DIAGNOSIS — N1832 Chronic kidney disease, stage 3b: Secondary | ICD-10-CM | POA: Diagnosis not present

## 2021-01-02 DIAGNOSIS — D469 Myelodysplastic syndrome, unspecified: Secondary | ICD-10-CM | POA: Diagnosis not present

## 2021-01-02 DIAGNOSIS — Z79899 Other long term (current) drug therapy: Secondary | ICD-10-CM | POA: Diagnosis not present

## 2021-01-02 MED ORDER — SODIUM CHLORIDE 0.9 % IV SOLN
10.0000 mg | Freq: Once | INTRAVENOUS | Status: AC
  Administered 2021-01-02: 10 mg via INTRAVENOUS
  Filled 2021-01-02: qty 10

## 2021-01-02 MED ORDER — SODIUM CHLORIDE 0.9 % IV SOLN
Freq: Once | INTRAVENOUS | Status: AC
Start: 2021-01-02 — End: 2021-01-02

## 2021-01-02 MED ORDER — PALONOSETRON HCL INJECTION 0.25 MG/5ML
0.2500 mg | Freq: Once | INTRAVENOUS | Status: AC
  Administered 2021-01-02: 0.25 mg via INTRAVENOUS
  Filled 2021-01-02: qty 5

## 2021-01-02 MED ORDER — HEPARIN SOD (PORK) LOCK FLUSH 100 UNIT/ML IV SOLN
500.0000 [IU] | Freq: Once | INTRAVENOUS | Status: AC | PRN
  Administered 2021-01-02: 500 [IU]

## 2021-01-02 MED ORDER — SODIUM CHLORIDE 0.9% FLUSH
10.0000 mL | INTRAVENOUS | Status: DC | PRN
  Administered 2021-01-02 (×2): 10 mL

## 2021-01-02 MED ORDER — SODIUM CHLORIDE 0.9 % IV SOLN
50.0000 mg/m2 | Freq: Once | INTRAVENOUS | Status: AC
  Administered 2021-01-02: 90 mg via INTRAVENOUS
  Filled 2021-01-02: qty 9

## 2021-01-02 NOTE — Patient Instructions (Signed)
[facility]  Discharge Instructions: Thank you for choosing [facility] to provide your oncology and hematology care.  If you have a lab appointment with the [facility], please come in thru the Main Entrance and check in at the main information desk.  Wear comfortable clothing and clothing appropriate for easy access to any Portacath or PICC line.   We strive to give you quality time with your provider. You may need to reschedule your appointment if you arrive late (15 or more minutes).  Arriving late affects you and other patients whose appointments are after yours.  Also, if you miss three or more appointments without notifying the office, you may be dismissed from the clinic at the provider's discretion.      For prescription refill requests, have your pharmacy contact our office and allow 72 hours for refills to be completed.    Today you received the following chemotherapy and/or immunotherapy agents: Vidaza. Return as scheduled.   To help prevent nausea and vomiting after your treatment, we encourage you to take your nausea medication as directed.  BELOW ARE SYMPTOMS THAT SHOULD BE REPORTED IMMEDIATELY: *FEVER GREATER THAN 100.4 F (38 C) OR HIGHER *CHILLS OR SWEATING *NAUSEA AND VOMITING THAT IS NOT CONTROLLED WITH YOUR NAUSEA MEDICATION *UNUSUAL SHORTNESS OF BREATH *UNUSUAL BRUISING OR BLEEDING *URINARY PROBLEMS (pain or burning when urinating, or frequent urination) *BOWEL PROBLEMS (unusual diarrhea, constipation, pain near the anus) TENDERNESS IN MOUTH AND THROAT WITH OR WITHOUT PRESENCE OF ULCERS (sore throat, sores in mouth, or a toothache) UNUSUAL RASH, SWELLING OR PAIN  UNUSUAL VAGINAL DISCHARGE OR ITCHING   Items with * indicate a potential emergency and should be followed up as soon as possible or go to the Emergency Department if any problems should occur.  Please show the CHEMOTHERAPY ALERT CARD or IMMUNOTHERAPY ALERT CARD at check-in to the  Emergency Department and triage nurse.  Should you have questions after your visit or need to cancel or reschedule your appointment, please contact [facility] [phone]  and follow the prompts.  Office hours are 8:00 a.m. to 4:30 p.m. Monday - Friday. Please note that voicemails left after 4:00 p.m. may not be returned until the following business day.  We are closed weekends and major holidays. You have access to a nurse at all times for urgent questions. Please call the main number to the clinic [phone] and follow the prompts.  For any non-urgent questions, you may also contact your provider using MyChart. We now offer e-Visits for anyone 80 and older to request care online for non-urgent symptoms. For details visit [email or web address].   Also download the MyChart app! Go to the app store, search "MyChart", open the app, select [facility], and log in with your MyChart username and password.  Due to Covid, a mask is required upon entering the hospital/clinic. If you do not have a mask, one will be given to you upon arrival. For doctor visits, patients may have 1 support person aged 67 or older with them. For treatment visits, patients cannot have anyone with them due to current Covid guidelines and our immunocompromised population.

## 2021-01-02 NOTE — Progress Notes (Signed)
Patient presents today for Vidaza Day 3. No new complaints from patient. Patient tolerated chemotherapy with no complaints voiced. Side effects with management reviewed understanding verbalized. Port site clean and dry with no bruising or swelling noted at site. Good blood return noted before and after administration of chemotherapy. Dressing reinforced. Patient left in satisfactory condition, via scooter with VSS and no s/s of distress noted.

## 2021-01-03 ENCOUNTER — Inpatient Hospital Stay (HOSPITAL_COMMUNITY): Payer: Medicare Other

## 2021-01-03 ENCOUNTER — Encounter (HOSPITAL_COMMUNITY): Payer: Self-pay

## 2021-01-03 VITALS — BP 100/49 | HR 62 | Temp 97.9°F | Resp 18

## 2021-01-03 DIAGNOSIS — N1832 Chronic kidney disease, stage 3b: Secondary | ICD-10-CM | POA: Diagnosis not present

## 2021-01-03 DIAGNOSIS — D469 Myelodysplastic syndrome, unspecified: Secondary | ICD-10-CM | POA: Diagnosis not present

## 2021-01-03 DIAGNOSIS — Z79899 Other long term (current) drug therapy: Secondary | ICD-10-CM | POA: Diagnosis not present

## 2021-01-03 DIAGNOSIS — D61818 Other pancytopenia: Secondary | ICD-10-CM | POA: Diagnosis not present

## 2021-01-03 MED ORDER — SODIUM CHLORIDE 0.9 % IV SOLN
10.0000 mg | Freq: Once | INTRAVENOUS | Status: AC
  Administered 2021-01-03: 10 mg via INTRAVENOUS
  Filled 2021-01-03: qty 10

## 2021-01-03 MED ORDER — SODIUM CHLORIDE 0.9% FLUSH
10.0000 mL | INTRAVENOUS | Status: DC | PRN
  Administered 2021-01-03: 10 mL

## 2021-01-03 MED ORDER — SODIUM CHLORIDE 0.9 % IV SOLN: Freq: Once | INTRAVENOUS | Status: AC

## 2021-01-03 MED ORDER — HEPARIN SOD (PORK) LOCK FLUSH 100 UNIT/ML IV SOLN
500.0000 [IU] | Freq: Once | INTRAVENOUS | Status: AC | PRN
Start: 2021-01-03 — End: 2021-01-03
  Administered 2021-01-03: 500 [IU]

## 2021-01-03 MED ORDER — SODIUM CHLORIDE 0.9 % IV SOLN
50.0000 mg/m2 | Freq: Once | INTRAVENOUS | Status: AC
  Administered 2021-01-03: 90 mg via INTRAVENOUS
  Filled 2021-01-03: qty 9

## 2021-01-03 NOTE — Progress Notes (Signed)
Patient presents today for Vidaza infusion.  Vital signs are stable and patient is in satisfactory condition.  Patient has no new complaints today.  Proceed with treatment as ordered.   Patient tolerated treatment well with no complaints voiced.  Patient left via wheelchair in stable condition.  Vital signs stable at discharge.  Follow up as scheduled.

## 2021-01-03 NOTE — Patient Instructions (Signed)
[facility]  Discharge Instructions: Thank you for choosing [facility] to provide your oncology and hematology care.  If you have a lab appointment with the Cancer Center, please come in thru the Main Entrance and check in at the main information desk.  Wear comfortable clothing and clothing appropriate for easy access to any Portacath or PICC line.   We strive to give you quality time with your provider. You may need to reschedule your appointment if you arrive late (15 or more minutes).  Arriving late affects you and other patients whose appointments are after yours.  Also, if you miss three or more appointments without notifying the office, you may be dismissed from the clinic at the provider's discretion.      For prescription refill requests, have your pharmacy contact our office and allow 72 hours for refills to be completed.        To help prevent nausea and vomiting after your treatment, we encourage you to take your nausea medication as directed.  BELOW ARE SYMPTOMS THAT SHOULD BE REPORTED IMMEDIATELY: *FEVER GREATER THAN 100.4 F (38 C) OR HIGHER *CHILLS OR SWEATING *NAUSEA AND VOMITING THAT IS NOT CONTROLLED WITH YOUR NAUSEA MEDICATION *UNUSUAL SHORTNESS OF BREATH *UNUSUAL BRUISING OR BLEEDING *URINARY PROBLEMS (pain or burning when urinating, or frequent urination) *BOWEL PROBLEMS (unusual diarrhea, constipation, pain near the anus) TENDERNESS IN MOUTH AND THROAT WITH OR WITHOUT PRESENCE OF ULCERS (sore throat, sores in mouth, or a toothache) UNUSUAL RASH, SWELLING OR PAIN  UNUSUAL VAGINAL DISCHARGE OR ITCHING   Items with * indicate a potential emergency and should be followed up as soon as possible or go to the Emergency Department if any problems should occur.  Please show the CHEMOTHERAPY ALERT CARD or IMMUNOTHERAPY ALERT CARD at check-in to the Emergency Department and triage nurse.  Should you have questions after your visit or need to cancel  or reschedule your appointment, please contact [facility] [phone]  and follow the prompts.  Office hours are 8:00 a.m. to 4:30 p.m. Monday - Friday. Please note that voicemails left after 4:00 p.m. may not be returned until the following business day.  We are closed weekends and major holidays. You have access to a nurse at all times for urgent questions. Please call the main number to the clinic [phone] and follow the prompts.  For any non-urgent questions, you may also contact your provider using MyChart. We now offer e-Visits for anyone 18 and older to request care online for non-urgent symptoms. For details visit [email or web address].   Also download the MyChart app! Go to the app store, search "MyChart", open the app, select [facility], and log in with your MyChart username and password.  Due to Covid, a mask is required upon entering the hospital/clinic. If you do not have a mask, one will be given to you upon arrival. For doctor visits, patients may have 1 support person aged 18 or older with them. For treatment visits, patients cannot have anyone with them due to current Covid guidelines and our immunocompromised population.  

## 2021-01-06 ENCOUNTER — Inpatient Hospital Stay (HOSPITAL_COMMUNITY): Payer: Medicare Other

## 2021-01-06 ENCOUNTER — Other Ambulatory Visit (HOSPITAL_COMMUNITY): Payer: Medicare Other

## 2021-01-06 ENCOUNTER — Other Ambulatory Visit: Payer: Self-pay

## 2021-01-06 VITALS — BP 107/48 | HR 73 | Temp 97.8°F | Resp 16

## 2021-01-06 DIAGNOSIS — D469 Myelodysplastic syndrome, unspecified: Secondary | ICD-10-CM

## 2021-01-06 DIAGNOSIS — N1832 Chronic kidney disease, stage 3b: Secondary | ICD-10-CM | POA: Diagnosis not present

## 2021-01-06 DIAGNOSIS — Z79899 Other long term (current) drug therapy: Secondary | ICD-10-CM | POA: Diagnosis not present

## 2021-01-06 DIAGNOSIS — D61818 Other pancytopenia: Secondary | ICD-10-CM | POA: Diagnosis not present

## 2021-01-06 LAB — CBC WITH DIFFERENTIAL/PLATELET
Abs Immature Granulocytes: 0.19 10*3/uL — ABNORMAL HIGH (ref 0.00–0.07)
Basophils Absolute: 0 10*3/uL (ref 0.0–0.1)
Basophils Relative: 0 %
Eosinophils Absolute: 0 10*3/uL (ref 0.0–0.5)
Eosinophils Relative: 0 %
HCT: 24.4 % — ABNORMAL LOW (ref 39.0–52.0)
Hemoglobin: 7.5 g/dL — ABNORMAL LOW (ref 13.0–17.0)
Immature Granulocytes: 2 %
Lymphocytes Relative: 8 %
Lymphs Abs: 0.7 10*3/uL (ref 0.7–4.0)
MCH: 25.6 pg — ABNORMAL LOW (ref 26.0–34.0)
MCHC: 30.7 g/dL (ref 30.0–36.0)
MCV: 83.3 fL (ref 80.0–100.0)
Monocytes Absolute: 0.2 10*3/uL (ref 0.1–1.0)
Monocytes Relative: 2 %
Neutro Abs: 7.2 10*3/uL (ref 1.7–7.7)
Neutrophils Relative %: 88 %
Platelets: 98 10*3/uL — ABNORMAL LOW (ref 150–400)
RBC: 2.93 MIL/uL — ABNORMAL LOW (ref 4.22–5.81)
RDW: 18.1 % — ABNORMAL HIGH (ref 11.5–15.5)
WBC: 8.3 10*3/uL (ref 4.0–10.5)
nRBC: 0.6 % — ABNORMAL HIGH (ref 0.0–0.2)

## 2021-01-06 LAB — COMPREHENSIVE METABOLIC PANEL WITH GFR
ALT: 13 U/L (ref 0–44)
AST: 16 U/L (ref 15–41)
Albumin: 3.2 g/dL — ABNORMAL LOW (ref 3.5–5.0)
Alkaline Phosphatase: 60 U/L (ref 38–126)
Anion gap: 6 (ref 5–15)
BUN: 42 mg/dL — ABNORMAL HIGH (ref 8–23)
CO2: 25 mmol/L (ref 22–32)
Calcium: 9.3 mg/dL (ref 8.9–10.3)
Chloride: 104 mmol/L (ref 98–111)
Creatinine, Ser: 1.9 mg/dL — ABNORMAL HIGH (ref 0.61–1.24)
GFR, Estimated: 33 mL/min — ABNORMAL LOW
Glucose, Bld: 98 mg/dL (ref 70–99)
Potassium: 4.1 mmol/L (ref 3.5–5.1)
Sodium: 135 mmol/L (ref 135–145)
Total Bilirubin: 0.6 mg/dL (ref 0.3–1.2)
Total Protein: 6.2 g/dL — ABNORMAL LOW (ref 6.5–8.1)

## 2021-01-06 LAB — LACTATE DEHYDROGENASE: LDH: 128 U/L (ref 98–192)

## 2021-01-06 LAB — PREPARE RBC (CROSSMATCH)

## 2021-01-06 LAB — SAMPLE TO BLOOD BANK

## 2021-01-06 MED ORDER — ACETAMINOPHEN 325 MG PO TABS
650.0000 mg | ORAL_TABLET | Freq: Once | ORAL | Status: AC
  Administered 2021-01-06: 650 mg via ORAL
  Filled 2021-01-06: qty 2

## 2021-01-06 MED ORDER — SODIUM CHLORIDE 0.9% IV SOLUTION
250.0000 mL | Freq: Once | INTRAVENOUS | Status: AC
  Administered 2021-01-06: 250 mL via INTRAVENOUS

## 2021-01-06 MED ORDER — SODIUM CHLORIDE 0.9 % IV SOLN: INTRAVENOUS | Status: AC

## 2021-01-06 MED ORDER — DIPHENHYDRAMINE HCL 25 MG PO CAPS
25.0000 mg | ORAL_CAPSULE | Freq: Once | ORAL | Status: AC
  Administered 2021-01-06: 25 mg via ORAL
  Filled 2021-01-06: qty 1

## 2021-01-06 MED ORDER — SODIUM CHLORIDE 0.9% FLUSH
10.0000 mL | INTRAVENOUS | Status: AC | PRN
  Administered 2021-01-06: 10 mL

## 2021-01-06 MED ORDER — SODIUM CHLORIDE 0.9 % IV SOLN
10.0000 mg | Freq: Once | INTRAVENOUS | Status: AC
  Administered 2021-01-06: 10 mg via INTRAVENOUS
  Filled 2021-01-06: qty 10

## 2021-01-06 MED ORDER — SODIUM CHLORIDE 0.9 % IV SOLN
50.0000 mg/m2 | Freq: Once | INTRAVENOUS | Status: AC
  Administered 2021-01-06: 90 mg via INTRAVENOUS
  Filled 2021-01-06: qty 9

## 2021-01-06 MED ORDER — SODIUM CHLORIDE 0.9 % IV SOLN: Freq: Once | INTRAVENOUS | Status: AC

## 2021-01-06 MED ORDER — HEPARIN SOD (PORK) LOCK FLUSH 100 UNIT/ML IV SOLN
500.0000 [IU] | Freq: Once | INTRAVENOUS | Status: AC | PRN
  Administered 2021-01-06: 500 [IU]

## 2021-01-06 MED ORDER — PALONOSETRON HCL INJECTION 0.25 MG/5ML
0.2500 mg | Freq: Once | INTRAVENOUS | Status: AC
  Administered 2021-01-06: 0.25 mg via INTRAVENOUS
  Filled 2021-01-06: qty 5

## 2021-01-06 NOTE — Progress Notes (Unsigned)
Patient presents today for Vidaza treatment.  Patient is in satisfactory condition with no new complaints today.  Labs are still pending. Ok to proceed with treatment per Dr. Delton Coombes.  Labs reviewed.  Patient's hemoglobin today is 7.2.  Patient will receive one unit of PRBC per Dr. Delton Coombes.    Patient tolerated treatment well with no complaints voiced.  Patient left via wheelchair in stable condition.  Vital signs stable at discharge.  Follow up as scheduled.

## 2021-01-06 NOTE — Patient Instructions (Signed)
[facility]  Discharge Instructions: Thank you for choosing [facility] to provide your oncology and hematology care.  If you have a lab appointment with the Cancer Center, please come in thru the Main Entrance and check in at the main information desk.  Wear comfortable clothing and clothing appropriate for easy access to any Portacath or PICC line.   We strive to give you quality time with your provider. You may need to reschedule your appointment if you arrive late (15 or more minutes).  Arriving late affects you and other patients whose appointments are after yours.  Also, if you miss three or more appointments without notifying the office, you may be dismissed from the clinic at the provider's discretion.      For prescription refill requests, have your pharmacy contact our office and allow 72 hours for refills to be completed.        To help prevent nausea and vomiting after your treatment, we encourage you to take your nausea medication as directed.  BELOW ARE SYMPTOMS THAT SHOULD BE REPORTED IMMEDIATELY: *FEVER GREATER THAN 100.4 F (38 C) OR HIGHER *CHILLS OR SWEATING *NAUSEA AND VOMITING THAT IS NOT CONTROLLED WITH YOUR NAUSEA MEDICATION *UNUSUAL SHORTNESS OF BREATH *UNUSUAL BRUISING OR BLEEDING *URINARY PROBLEMS (pain or burning when urinating, or frequent urination) *BOWEL PROBLEMS (unusual diarrhea, constipation, pain near the anus) TENDERNESS IN MOUTH AND THROAT WITH OR WITHOUT PRESENCE OF ULCERS (sore throat, sores in mouth, or a toothache) UNUSUAL RASH, SWELLING OR PAIN  UNUSUAL VAGINAL DISCHARGE OR ITCHING   Items with * indicate a potential emergency and should be followed up as soon as possible or go to the Emergency Department if any problems should occur.  Please show the CHEMOTHERAPY ALERT CARD or IMMUNOTHERAPY ALERT CARD at check-in to the Emergency Department and triage nurse.  Should you have questions after your visit or need to cancel  or reschedule your appointment, please contact [facility] [phone]  and follow the prompts.  Office hours are 8:00 a.m. to 4:30 p.m. Monday - Friday. Please note that voicemails left after 4:00 p.m. may not be returned until the following business day.  We are closed weekends and major holidays. You have access to a nurse at all times for urgent questions. Please call the main number to the clinic [phone] and follow the prompts.  For any non-urgent questions, you may also contact your provider using MyChart. We now offer e-Visits for anyone 18 and older to request care online for non-urgent symptoms. For details visit [email or web address].   Also download the MyChart app! Go to the app store, search "MyChart", open the app, select [facility], and log in with your MyChart username and password.  Due to Covid, a mask is required upon entering the hospital/clinic. If you do not have a mask, one will be given to you upon arrival. For doctor visits, patients may have 1 support person aged 18 or older with them. For treatment visits, patients cannot have anyone with them due to current Covid guidelines and our immunocompromised population.  

## 2021-01-07 LAB — TYPE AND SCREEN
ABO/RH(D): B NEG
Antibody Screen: NEGATIVE
Unit division: 0

## 2021-01-07 LAB — BPAM RBC
Blood Product Expiration Date: 202209012359
ISSUE DATE / TIME: 202208221024
Unit Type and Rh: 9500

## 2021-01-13 ENCOUNTER — Inpatient Hospital Stay (HOSPITAL_COMMUNITY): Payer: Medicare Other

## 2021-01-13 ENCOUNTER — Ambulatory Visit (HOSPITAL_COMMUNITY): Payer: Medicare Other

## 2021-01-13 ENCOUNTER — Other Ambulatory Visit (HOSPITAL_COMMUNITY): Payer: Medicare Other

## 2021-01-13 ENCOUNTER — Other Ambulatory Visit: Payer: Self-pay

## 2021-01-13 ENCOUNTER — Ambulatory Visit (HOSPITAL_COMMUNITY): Payer: Medicare Other | Admitting: Hematology

## 2021-01-13 DIAGNOSIS — D469 Myelodysplastic syndrome, unspecified: Secondary | ICD-10-CM

## 2021-01-13 DIAGNOSIS — D61818 Other pancytopenia: Secondary | ICD-10-CM | POA: Diagnosis not present

## 2021-01-13 DIAGNOSIS — N1832 Chronic kidney disease, stage 3b: Secondary | ICD-10-CM | POA: Diagnosis not present

## 2021-01-13 DIAGNOSIS — Z79899 Other long term (current) drug therapy: Secondary | ICD-10-CM | POA: Diagnosis not present

## 2021-01-13 LAB — SAMPLE TO BLOOD BANK

## 2021-01-13 LAB — CBC WITH DIFFERENTIAL/PLATELET
Basophils Absolute: 0 10*3/uL (ref 0.0–0.1)
Basophils Relative: 0 %
Eosinophils Absolute: 0 10*3/uL (ref 0.0–0.5)
Eosinophils Relative: 0 %
HCT: 28 % — ABNORMAL LOW (ref 39.0–52.0)
Hemoglobin: 8.8 g/dL — ABNORMAL LOW (ref 13.0–17.0)
Lymphocytes Relative: 15 %
Lymphs Abs: 0.8 10*3/uL (ref 0.7–4.0)
MCH: 26.5 pg (ref 26.0–34.0)
MCHC: 31.4 g/dL (ref 30.0–36.0)
MCV: 84.3 fL (ref 80.0–100.0)
Metamyelocytes Relative: 2 %
Monocytes Absolute: 0.1 10*3/uL (ref 0.1–1.0)
Monocytes Relative: 2 %
Neutro Abs: 4.1 10*3/uL (ref 1.7–7.7)
Neutrophils Relative %: 81 %
Platelets: 36 10*3/uL — ABNORMAL LOW (ref 150–400)
RBC: 3.32 MIL/uL — ABNORMAL LOW (ref 4.22–5.81)
RDW: 18 % — ABNORMAL HIGH (ref 11.5–15.5)
WBC: 5.1 10*3/uL (ref 4.0–10.5)
nRBC: 0 % (ref 0.0–0.2)

## 2021-01-13 LAB — COMPREHENSIVE METABOLIC PANEL
ALT: 16 U/L (ref 0–44)
AST: 16 U/L (ref 15–41)
Albumin: 3.3 g/dL — ABNORMAL LOW (ref 3.5–5.0)
Alkaline Phosphatase: 68 U/L (ref 38–126)
Anion gap: 5 (ref 5–15)
BUN: 31 mg/dL — ABNORMAL HIGH (ref 8–23)
CO2: 25 mmol/L (ref 22–32)
Calcium: 9.5 mg/dL (ref 8.9–10.3)
Chloride: 106 mmol/L (ref 98–111)
Creatinine, Ser: 1.69 mg/dL — ABNORMAL HIGH (ref 0.61–1.24)
GFR, Estimated: 38 mL/min — ABNORMAL LOW (ref >60)
Glucose, Bld: 96 mg/dL (ref 70–99)
Potassium: 4.1 mmol/L (ref 3.5–5.1)
Sodium: 136 mmol/L (ref 135–145)
Total Bilirubin: 0.8 mg/dL (ref 0.3–1.2)
Total Protein: 6.4 g/dL — ABNORMAL LOW (ref 6.5–8.1)

## 2021-01-13 LAB — LACTATE DEHYDROGENASE: LDH: 136 U/L (ref 98–192)

## 2021-01-13 MED ORDER — HEPARIN SOD (PORK) LOCK FLUSH 100 UNIT/ML IV SOLN
500.0000 [IU] | Freq: Once | INTRAVENOUS | Status: AC
  Administered 2021-01-13: 500 [IU] via INTRAVENOUS

## 2021-01-13 MED ORDER — SODIUM CHLORIDE 0.9% FLUSH
10.0000 mL | INTRAVENOUS | Status: DC | PRN
  Administered 2021-01-13: 10 mL via INTRAVENOUS

## 2021-01-13 NOTE — Progress Notes (Signed)
Patient presents today for blood work and possible blood products. HGB 8.8 today. Patient has no complaints of any shortness of breath, fatigue, or dizziness.   Vital signs stable. No complaints at this time. Discharged from clinic by motorized scooter in stable condition. Alert and oriented x 3. F/U with [facility] as scheduled.

## 2021-01-13 NOTE — Patient Instructions (Signed)
[facility]  Discharge Instructions: Thank you for choosing [facility] to provide your oncology and hematology care.  If you have a lab appointment with the [facility], please come in thru the Main Entrance and check in at the main information desk.  Wear comfortable clothing and clothing appropriate for easy access to any Portacath or PICC line.   We strive to give you quality time with your provider. You may need to reschedule your appointment if you arrive late (15 or more minutes).  Arriving late affects you and other patients whose appointments are after yours.  Also, if you miss three or more appointments without notifying the office, you may be dismissed from the clinic at the provider's discretion.      For prescription refill requests, have your pharmacy contact our office and allow 72 hours for refills to be completed.    Today no blood transfusion was needed.   BELOW ARE SYMPTOMS THAT SHOULD BE REPORTED IMMEDIATELY: *FEVER GREATER THAN 100.4 F (38 C) OR HIGHER *CHILLS OR SWEATING *NAUSEA AND VOMITING THAT IS NOT CONTROLLED WITH YOUR NAUSEA MEDICATION *UNUSUAL SHORTNESS OF BREATH *UNUSUAL BRUISING OR BLEEDING *URINARY PROBLEMS (pain or burning when urinating, or frequent urination) *BOWEL PROBLEMS (unusual diarrhea, constipation, pain near the anus) TENDERNESS IN MOUTH AND THROAT WITH OR WITHOUT PRESENCE OF ULCERS (sore throat, sores in mouth, or a toothache) UNUSUAL RASH, SWELLING OR PAIN  UNUSUAL VAGINAL DISCHARGE OR ITCHING   Items with * indicate a potential emergency and should be followed up as soon as possible or go to the Emergency Department if any problems should occur.  Please show the CHEMOTHERAPY ALERT CARD or IMMUNOTHERAPY ALERT CARD at check-in to the Emergency Department and triage nurse.  Should you have questions after your visit or need to cancel or reschedule your appointment, please contact [facility]  [phone]  and follow the prompts.  Office hours are 8:00 a.m. to 4:30 p.m. Monday - Friday. Please note that voicemails left after 4:00 p.m. may not be returned until the following business day.  We are closed weekends and major holidays. You have access to a nurse at all times for urgent questions. Please call the main number to the clinic [phone] and follow the prompts.  For any non-urgent questions, you may also contact your provider using MyChart. We now offer e-Visits for anyone 47 and older to request care online for non-urgent symptoms. For details visit [email or web address].   Also download the MyChart app! Go to the app store, search "MyChart", open the app, select [facility], and log in with your MyChart username and password.  Due to Covid, a mask is required upon entering the hospital/clinic. If you do not have a mask, one will be given to you upon arrival. For doctor visits, patients may have 1 support person aged 73 or older with them. For treatment visits, patients cannot have anyone with them due to current Covid guidelines and our immunocompromised population.

## 2021-01-14 ENCOUNTER — Ambulatory Visit (HOSPITAL_COMMUNITY): Payer: Medicare Other

## 2021-01-15 ENCOUNTER — Ambulatory Visit (HOSPITAL_COMMUNITY): Payer: Medicare Other

## 2021-01-16 ENCOUNTER — Ambulatory Visit (HOSPITAL_COMMUNITY): Payer: Medicare Other

## 2021-01-17 ENCOUNTER — Ambulatory Visit (HOSPITAL_COMMUNITY): Payer: Medicare Other

## 2021-01-21 ENCOUNTER — Inpatient Hospital Stay (HOSPITAL_BASED_OUTPATIENT_CLINIC_OR_DEPARTMENT_OTHER): Payer: Medicare Other | Admitting: Physician Assistant

## 2021-01-21 ENCOUNTER — Inpatient Hospital Stay (HOSPITAL_COMMUNITY): Payer: Medicare Other | Attending: Hematology

## 2021-01-21 ENCOUNTER — Inpatient Hospital Stay (HOSPITAL_COMMUNITY): Payer: Medicare Other

## 2021-01-21 ENCOUNTER — Encounter (HOSPITAL_COMMUNITY): Payer: Self-pay

## 2021-01-21 ENCOUNTER — Other Ambulatory Visit: Payer: Self-pay

## 2021-01-21 VITALS — BP 96/62 | Wt 124.6 lb

## 2021-01-21 DIAGNOSIS — R531 Weakness: Secondary | ICD-10-CM | POA: Insufficient documentation

## 2021-01-21 DIAGNOSIS — R530 Neoplastic (malignant) related fatigue: Secondary | ICD-10-CM | POA: Insufficient documentation

## 2021-01-21 DIAGNOSIS — D469 Myelodysplastic syndrome, unspecified: Secondary | ICD-10-CM

## 2021-01-21 DIAGNOSIS — R5383 Other fatigue: Secondary | ICD-10-CM | POA: Diagnosis not present

## 2021-01-21 DIAGNOSIS — Z5111 Encounter for antineoplastic chemotherapy: Secondary | ICD-10-CM | POA: Insufficient documentation

## 2021-01-21 DIAGNOSIS — Z79899 Other long term (current) drug therapy: Secondary | ICD-10-CM | POA: Insufficient documentation

## 2021-01-21 DIAGNOSIS — R634 Abnormal weight loss: Secondary | ICD-10-CM | POA: Diagnosis not present

## 2021-01-21 LAB — CBC WITH DIFFERENTIAL/PLATELET
Abs Immature Granulocytes: 0.14 10*3/uL — ABNORMAL HIGH (ref 0.00–0.07)
Basophils Absolute: 0 10*3/uL (ref 0.0–0.1)
Basophils Relative: 0 %
Eosinophils Absolute: 0 10*3/uL (ref 0.0–0.5)
Eosinophils Relative: 0 %
HCT: 27.2 % — ABNORMAL LOW (ref 39.0–52.0)
Hemoglobin: 8.4 g/dL — ABNORMAL LOW (ref 13.0–17.0)
Immature Granulocytes: 3 %
Lymphocytes Relative: 13 %
Lymphs Abs: 0.6 10*3/uL — ABNORMAL LOW (ref 0.7–4.0)
MCH: 26 pg (ref 26.0–34.0)
MCHC: 30.9 g/dL (ref 30.0–36.0)
MCV: 84.2 fL (ref 80.0–100.0)
Monocytes Absolute: 0.4 10*3/uL (ref 0.1–1.0)
Monocytes Relative: 8 %
Neutro Abs: 3.5 10*3/uL (ref 1.7–7.7)
Neutrophils Relative %: 76 %
Platelets: 42 10*3/uL — ABNORMAL LOW (ref 150–400)
RBC: 3.23 MIL/uL — ABNORMAL LOW (ref 4.22–5.81)
RDW: 17.3 % — ABNORMAL HIGH (ref 11.5–15.5)
WBC: 4.6 10*3/uL (ref 4.0–10.5)
nRBC: 0.4 % — ABNORMAL HIGH (ref 0.0–0.2)

## 2021-01-21 LAB — COMPREHENSIVE METABOLIC PANEL
ALT: 13 U/L (ref 0–44)
AST: 15 U/L (ref 15–41)
Albumin: 3.3 g/dL — ABNORMAL LOW (ref 3.5–5.0)
Alkaline Phosphatase: 67 U/L (ref 38–126)
Anion gap: 6 (ref 5–15)
BUN: 33 mg/dL — ABNORMAL HIGH (ref 8–23)
CO2: 24 mmol/L (ref 22–32)
Calcium: 10.1 mg/dL (ref 8.9–10.3)
Chloride: 106 mmol/L (ref 98–111)
Creatinine, Ser: 1.98 mg/dL — ABNORMAL HIGH (ref 0.61–1.24)
GFR, Estimated: 32 mL/min — ABNORMAL LOW (ref >60)
Glucose, Bld: 94 mg/dL (ref 70–99)
Potassium: 3.7 mmol/L (ref 3.5–5.1)
Sodium: 136 mmol/L (ref 135–145)
Total Bilirubin: 1.1 mg/dL (ref 0.3–1.2)
Total Protein: 6.6 g/dL (ref 6.5–8.1)

## 2021-01-21 LAB — SAMPLE TO BLOOD BANK

## 2021-01-21 LAB — LACTATE DEHYDROGENASE: LDH: 121 U/L (ref 98–192)

## 2021-01-21 MED ORDER — HEPARIN SOD (PORK) LOCK FLUSH 100 UNIT/ML IV SOLN
500.0000 [IU] | Freq: Once | INTRAVENOUS | Status: AC
  Administered 2021-01-21: 500 [IU] via INTRAVENOUS

## 2021-01-21 MED ORDER — SODIUM CHLORIDE 0.9% FLUSH
10.0000 mL | Freq: Once | INTRAVENOUS | Status: AC
  Administered 2021-01-21: 10 mL via INTRAVENOUS

## 2021-01-21 NOTE — Patient Instructions (Signed)
[facility]  Discharge Instructions: Thank you for choosing [facility] to provide your oncology and hematology care.  If you have a lab appointment with the Cancer Center, please come in thru the Main Entrance and check in at the main information desk.  Wear comfortable clothing and clothing appropriate for easy access to any Portacath or PICC line.   We strive to give you quality time with your provider. You may need to reschedule your appointment if you arrive late (15 or more minutes).  Arriving late affects you and other patients whose appointments are after yours.  Also, if you miss three or more appointments without notifying the office, you may be dismissed from the clinic at the provider's discretion.      For prescription refill requests, have your pharmacy contact our office and allow 72 hours for refills to be completed.        To help prevent nausea and vomiting after your treatment, we encourage you to take your nausea medication as directed.  BELOW ARE SYMPTOMS THAT SHOULD BE REPORTED IMMEDIATELY: *FEVER GREATER THAN 100.4 F (38 C) OR HIGHER *CHILLS OR SWEATING *NAUSEA AND VOMITING THAT IS NOT CONTROLLED WITH YOUR NAUSEA MEDICATION *UNUSUAL SHORTNESS OF BREATH *UNUSUAL BRUISING OR BLEEDING *URINARY PROBLEMS (pain or burning when urinating, or frequent urination) *BOWEL PROBLEMS (unusual diarrhea, constipation, pain near the anus) TENDERNESS IN MOUTH AND THROAT WITH OR WITHOUT PRESENCE OF ULCERS (sore throat, sores in mouth, or a toothache) UNUSUAL RASH, SWELLING OR PAIN  UNUSUAL VAGINAL DISCHARGE OR ITCHING   Items with * indicate a potential emergency and should be followed up as soon as possible or go to the Emergency Department if any problems should occur.  Please show the CHEMOTHERAPY ALERT CARD or IMMUNOTHERAPY ALERT CARD at check-in to the Emergency Department and triage nurse.  Should you have questions after your visit or need to cancel  or reschedule your appointment, please contact [facility] [phone]  and follow the prompts.  Office hours are 8:00 a.m. to 4:30 p.m. Monday - Friday. Please note that voicemails left after 4:00 p.m. may not be returned until the following business day.  We are closed weekends and major holidays. You have access to a nurse at all times for urgent questions. Please call the main number to the clinic [phone] and follow the prompts.  For any non-urgent questions, you may also contact your provider using MyChart. We now offer e-Visits for anyone 18 and older to request care online for non-urgent symptoms. For details visit [email or web address].   Also download the MyChart app! Go to the app store, search "MyChart", open the app, select [facility], and log in with your MyChart username and password.  Due to Covid, a mask is required upon entering the hospital/clinic. If you do not have a mask, one will be given to you upon arrival. For doctor visits, patients may have 1 support person aged 18 or older with them. For treatment visits, patients cannot have anyone with them due to current Covid guidelines and our immunocompromised population.  

## 2021-01-21 NOTE — Progress Notes (Addendum)
Thomas S. [address], [place] [address] Phone: [phone] Fax: [place] PROGRESS NOTE   [employer] [id number] 1929/06/15 87 y.o.  LAKE CINQUEMANI is managed by Dr. Delton Coombes for myelodysplastic syndrome.  Actively treated with chemotherapy/immunotherapy/hormonal therapy: YES  Current therapy: Azacitidine  Last treated: Week of 12/31/2020  Next scheduled appointment with provider: 01/27/2021  Subjective:  Chief Complaint: Fatigue and malaise  [name] is an 87 year old male who follows with Dr. Delton Coombes for treatment of myelodysplastic syndrome.  He has so far completed 5 cycles of azacitidine, most recently given during the week of 12/31/2020.  He presented to the clinic today for labs, but complained of having severe fatigue and malaise, therefore ended for symptom management evaluation.  Mr. Diffee reports that for the past 4 to 5 days he has had "extreme weakness."  He reports that he is unable to walk more than 2-3 steps without needing to stop and rest.  He has spent majority of the last several days in bed.  He says that this is "the worst he has ever felt."  He is on 3 L submental oxygen due to COPD, but increased his oxygen to 4 L this week, reports that he felt mildly better afterwards.  He denies any cough, wheezing, or increased shortness of breath.  No chest pain, fever, chills, nausea, vomiting, diarrhea.  No current signs or symptoms of blood loss.  He reports that he drinks about 3 quarts of water per day as well as several glasses of orange juice.  However, he admits to very poor food intake.  For example, yesterday he is only caloric intake was a piece of toast, chicken drumstick, and 2 Ensure nutritional shakes.  His weight today is 124.6 pounds, which is down 5 pounds over the past two weeks.  Review of Systems:  Review of Systems  Constitutional:  Positive for activity  change, appetite change, fatigue and unexpected weight change. Negative for chills, diaphoresis and fever.  HENT:  Negative for congestion.   Respiratory:  Positive for shortness of breath (with exertion). Negative for cough and wheezing.   Cardiovascular:  Negative for chest pain and palpitations.  Gastrointestinal:  Negative for abdominal pain, diarrhea, nausea and vomiting.  Neurological:  Positive for dizziness and weakness.    Past Medical History, Surgical history, Social history, and Family history were reviewed as documented elsewhere in chart, and were updated as appropriate.   Objective:   Physical Exam:  There were no vitals taken for this visit. ECOG: 3  Physical Exam Constitutional:      Appearance: He is cachectic.     Comments: Weak-appearing  HENT:     Head: Normocephalic and atraumatic.     Mouth/Throat:     Mouth: Mucous membranes are moist.  Eyes:     Extraocular Movements: Extraocular movements intact.     Pupils: Pupils are equal, round, and reactive to light.  Cardiovascular:     Rate and Rhythm: Normal rate and regular rhythm.     Pulses: Normal pulses.     Heart sounds: Normal heart sounds.  Pulmonary:     Effort: Pulmonary effort is normal.     Breath sounds: Decreased air movement present.  Abdominal:     General: Bowel sounds are normal.     Palpations: Abdomen is soft.     Tenderness: There is no abdominal tenderness.  Musculoskeletal:        General: No  swelling.     Right lower leg: No edema.     Left lower leg: No edema.  Lymphadenopathy:     Cervical: No cervical adenopathy.  Skin:    General: Skin is warm and dry.  Neurological:     General: No focal deficit present.     Mental Status: He is alert and oriented to person, place, and time.  Psychiatric:        Mood and Affect: Mood normal.        Behavior: Behavior normal.    Lab Review:     Component Value Date/Time   NA 136 01/21/2021 0834   K 3.7 01/21/2021 0834   CL 106  01/21/2021 0834   CO2 24 01/21/2021 0834   GLUCOSE 94 01/21/2021 0834   BUN 33 (H) 01/21/2021 0834   CREATININE 1.98 (H) 01/21/2021 0834   CALCIUM 10.1 01/21/2021 0834   CALCIUM 8.9 03/20/2016 1052   PROT 6.6 01/21/2021 0834   ALBUMIN 3.3 (L) 01/21/2021 0834   AST 15 01/21/2021 0834   ALT 13 01/21/2021 0834   ALKPHOS 67 01/21/2021 0834   BILITOT 1.1 01/21/2021 0834   GFRNONAA 32 (L) 01/21/2021 0834   GFRAA 52 (L) 06/21/2019 0207       Component Value Date/Time   WBC 4.6 01/21/2021 0834   RBC 3.23 (L) 01/21/2021 0834   HGB 8.4 (L) 01/21/2021 0834   HCT 27.2 (L) 01/21/2021 0834   PLT 42 (L) 01/21/2021 0834   MCV 84.2 01/21/2021 0834   MCH 26.0 01/21/2021 0834   MCHC 30.9 01/21/2021 0834   RDW 17.3 (H) 01/21/2021 0834   LYMPHSABS 0.6 (L) 01/21/2021 0834   MONOABS 0.4 01/21/2021 0834   EOSABS 0.0 01/21/2021 0834   BASOSABS 0.0 01/21/2021 0834   -------------------------------  Imaging from last 24 hours (if applicable):  Radiology interpretation: No results found.    Assessment & Plan:    1.  Weakness/fatigue with unintentional weight loss - Reviewed labs from today - Hgb is 8.4, at baseline, no need for PRBC transfusion - Patient does not appear dehydrated - Patient weight today is 124.6 pounds (down 5 pounds in the past 2 weeks) - Suspect weakness and fatigue secondary to poor nutrition, as well as side effects of azacitidine - PLAN: Encouraged increased nutritional intake, at least 3 Ensure shakes per day as well as 4-6 high-protein/high-calorie snacks throughout the day.  Patient has been previously evaluated by dietitian, will reach out to dietitian and asked her to call and check in on the patient this week.  Patient will call clinic and schedule symptom management follow-up if he has any worsening symptoms.  He will present to the ED if any severely worsened symptoms or alarm symptoms.  2.  Myelodysplastic syndrome - Continue to follow with Dr. Delton Coombes for  ongoing management of [place] reviewed today (01/21/2021) with CBC showing normal WBC, Hgb 8.4 with MCV 84.2, and platelets 42 - PLAN: Discussed with Dr. Delton Coombes, who recommends increasing the duration between treatment cycles to 6 weeks (instead of every 4 weeks).  Continue weekly labs.   PLAN SUMMARY & DISPOSITION: - Continue weekly labs (CBC) with additional labs (CBC, CMP, LDH) in 3 weeks, prior to next cycle of chemo - We will push upcoming cycle #6 of azacitidine back 2 additional weeks and we will plan on continuing with treatment cycles every 6 weeks - Next appointment with Dr. Delton Coombes will be in the week of 02/10/2021 - Patient encouraged to call  and schedule follow-up symptom management visit if his symptoms are not improved - Patient knows to present to the ED if he has any severe symptoms or alarm symptoms - Dietitian to follow with Mr. Tino  All questions were answered. The patient knows to call the clinic with any problems, questions or concerns.  Medical decision making: Low  Time spent on visit: I spent 20 minutes counseling the patient face to face. The total time spent in the appointment was 30 minutes and more than 50% was on counseling.   Harriett Rush, PA-C  01/21/2021 12:14 PM

## 2021-01-21 NOTE — Progress Notes (Signed)
Patient presented today for labs, possible blood transfusion. Pt stated he did not have a good weekend. He has no energy. He had to increase his oxygen from 3 liters to 4 this weekend. He stated he can only take 3-4 steps and then feels like his legs are gonna fall out from underneath him. Hemoglobin is 8.4. PA aware and will evaluate him further.   No further orders at this time, patient will go to the Emergency room if symptoms get worse.    Vitals stable and discharged home from clinic via motorized scooter.  Follow up as scheduled.

## 2021-01-27 ENCOUNTER — Inpatient Hospital Stay (HOSPITAL_COMMUNITY): Payer: Medicare Other

## 2021-01-27 ENCOUNTER — Other Ambulatory Visit (HOSPITAL_COMMUNITY): Payer: Medicare Other

## 2021-01-27 ENCOUNTER — Encounter (HOSPITAL_COMMUNITY): Payer: Self-pay

## 2021-01-27 ENCOUNTER — Other Ambulatory Visit: Payer: Self-pay

## 2021-01-27 ENCOUNTER — Inpatient Hospital Stay (HOSPITAL_COMMUNITY): Payer: Medicare Other | Admitting: Dietician

## 2021-01-27 ENCOUNTER — Ambulatory Visit (HOSPITAL_COMMUNITY): Payer: Medicare Other

## 2021-01-27 ENCOUNTER — Ambulatory Visit (HOSPITAL_COMMUNITY): Payer: Medicare Other | Admitting: Hematology

## 2021-01-27 VITALS — BP 88/56 | HR 67 | Temp 97.6°F | Resp 18

## 2021-01-27 DIAGNOSIS — D649 Anemia, unspecified: Secondary | ICD-10-CM

## 2021-01-27 DIAGNOSIS — D469 Myelodysplastic syndrome, unspecified: Secondary | ICD-10-CM

## 2021-01-27 DIAGNOSIS — R5383 Other fatigue: Secondary | ICD-10-CM | POA: Diagnosis not present

## 2021-01-27 DIAGNOSIS — Z79899 Other long term (current) drug therapy: Secondary | ICD-10-CM | POA: Diagnosis not present

## 2021-01-27 DIAGNOSIS — R634 Abnormal weight loss: Secondary | ICD-10-CM | POA: Diagnosis not present

## 2021-01-27 DIAGNOSIS — R531 Weakness: Secondary | ICD-10-CM | POA: Diagnosis not present

## 2021-01-27 DIAGNOSIS — E46 Unspecified protein-calorie malnutrition: Secondary | ICD-10-CM

## 2021-01-27 DIAGNOSIS — Z5111 Encounter for antineoplastic chemotherapy: Secondary | ICD-10-CM | POA: Diagnosis not present

## 2021-01-27 LAB — CBC WITH DIFFERENTIAL/PLATELET
Basophils Absolute: 0 10*3/uL (ref 0.0–0.1)
Basophils Relative: 0 %
Eosinophils Absolute: 0 10*3/uL (ref 0.0–0.5)
Eosinophils Relative: 1 %
HCT: 25.2 % — ABNORMAL LOW (ref 39.0–52.0)
Hemoglobin: 7.8 g/dL — ABNORMAL LOW (ref 13.0–17.0)
Lymphocytes Relative: 20 %
Lymphs Abs: 0.7 10*3/uL (ref 0.7–4.0)
MCH: 25.9 pg — ABNORMAL LOW (ref 26.0–34.0)
MCHC: 31 g/dL (ref 30.0–36.0)
MCV: 83.7 fL (ref 80.0–100.0)
Monocytes Absolute: 0.1 10*3/uL (ref 0.1–1.0)
Monocytes Relative: 3 %
Neutro Abs: 2.6 10*3/uL (ref 1.7–7.7)
Neutrophils Relative %: 76 %
Platelets: 57 10*3/uL — ABNORMAL LOW (ref 150–400)
RBC: 3.01 MIL/uL — ABNORMAL LOW (ref 4.22–5.81)
RDW: 17.4 % — ABNORMAL HIGH (ref 11.5–15.5)
WBC: 3.4 10*3/uL — ABNORMAL LOW (ref 4.0–10.5)
nRBC: 0.9 % — ABNORMAL HIGH (ref 0.0–0.2)
nRBC: 1 /100 WBC — ABNORMAL HIGH

## 2021-01-27 LAB — SAMPLE TO BLOOD BANK

## 2021-01-27 LAB — PREPARE RBC (CROSSMATCH)

## 2021-01-27 MED ORDER — SODIUM CHLORIDE 0.9% FLUSH
10.0000 mL | INTRAVENOUS | Status: AC | PRN
  Administered 2021-01-27: 10 mL

## 2021-01-27 MED ORDER — SODIUM CHLORIDE 0.9% IV SOLUTION
250.0000 mL | Freq: Once | INTRAVENOUS | Status: AC
  Administered 2021-01-27: 250 mL via INTRAVENOUS

## 2021-01-27 MED ORDER — HEPARIN SOD (PORK) LOCK FLUSH 100 UNIT/ML IV SOLN
500.0000 [IU] | Freq: Every day | INTRAVENOUS | Status: AC | PRN
  Administered 2021-01-27: 500 [IU]

## 2021-01-27 MED ORDER — ACETAMINOPHEN 325 MG PO TABS
650.0000 mg | ORAL_TABLET | Freq: Once | ORAL | Status: AC
Start: 2021-01-27 — End: 2021-01-27
  Administered 2021-01-27: 650 mg via ORAL
  Filled 2021-01-27: qty 2

## 2021-01-27 MED ORDER — DIPHENHYDRAMINE HCL 25 MG PO CAPS
25.0000 mg | ORAL_CAPSULE | Freq: Once | ORAL | Status: AC
  Administered 2021-01-27: 25 mg via ORAL
  Filled 2021-01-27: qty 1

## 2021-01-27 MED ORDER — MEGESTROL ACETATE 20 MG PO TABS: 20.0000 mg | ORAL_TABLET | Freq: Every day | ORAL | 2 refills | Status: DC

## 2021-01-27 NOTE — Patient Instructions (Signed)
[place]  Discharge Instructions: Thank you for choosing [facility] to provide your oncology and hematology care.  If you have a lab appointment with the [facility], please come in thru the Main Entrance and check in at the main information desk.  Wear comfortable clothing and clothing appropriate for easy access to any Portacath or PICC line.   We strive to give you quality time with your provider. You may need to reschedule your appointment if you arrive late (15 or more minutes).  Arriving late affects you and other patients whose appointments are after yours.  Also, if you miss three or more appointments without notifying the office, you may be dismissed from the clinic at the provider's discretion.      For prescription refill requests, have your pharmacy contact our office and allow 72 hours for refills to be completed.    Today you received the following 1 PRBCs. Return as scheduled.   To help prevent nausea and vomiting after your treatment, we encourage you to take your nausea medication as directed.  BELOW ARE SYMPTOMS THAT SHOULD BE REPORTED IMMEDIATELY: *FEVER GREATER THAN 100.4 F (38 C) OR HIGHER *CHILLS OR SWEATING *NAUSEA AND VOMITING THAT IS NOT CONTROLLED WITH YOUR NAUSEA MEDICATION *UNUSUAL SHORTNESS OF BREATH *UNUSUAL BRUISING OR BLEEDING *URINARY PROBLEMS (pain or burning when urinating, or frequent urination) *BOWEL PROBLEMS (unusual diarrhea, constipation, pain near the anus) TENDERNESS IN MOUTH AND THROAT WITH OR WITHOUT PRESENCE OF ULCERS (sore throat, sores in mouth, or a toothache) UNUSUAL RASH, SWELLING OR PAIN  UNUSUAL VAGINAL DISCHARGE OR ITCHING   Items with * indicate a potential emergency and should be followed up as soon as possible or go to the Emergency Department if any problems should occur.  Please show the CHEMOTHERAPY ALERT CARD or IMMUNOTHERAPY ALERT CARD at check-in to the Emergency Department and triage  nurse.  Should you have questions after your visit or need to cancel or reschedule your appointment, please contact [facility] [phone]  and follow the prompts.  Office hours are 8:00 a.m. to 4:30 p.m. Monday - Friday. Please note that voicemails left after 4:00 p.m. may not be returned until the following business day.  We are closed weekends and major holidays. You have access to a nurse at all times for urgent questions. Please call the main number to the clinic [phone] and follow the prompts.  For any non-urgent questions, you may also contact your provider using MyChart. We now offer e-Visits for anyone 27 and older to request care online for non-urgent symptoms. For details visit [email or web address].   Also download the MyChart app! Go to the app store, search "MyChart", open the app, select [place], and log in with your MyChart username and password.  Due to Covid, a mask is required upon entering the hospital/clinic. If you do not have a mask, one will be given to you upon arrival. For doctor visits, patients may have 1 support person aged 61 or older with them. For treatment visits, patients cannot have anyone with them due to current Covid guidelines and our immunocompromised population.

## 2021-01-27 NOTE — Progress Notes (Signed)
I performed a brief nonbillable follow-up visit with [name] today.  He continues to feel weak and lethargic, but is slightly improved from last week.  He has been trying to force himself to eat, but did throw up once this week after trying to make himself finish a meal.  He continues to have extremely poor appetite.  Hemoglobin 7.8 today.  We will transfuse 1 unit PRBC due to profound fatigue in light of anemia.  Discussed with nutritionist, Lajuan Lines, and will switch patient to Megace for appetite stimulant, as he did not note any improvement on Marinol.  He is scheduled to see Dr. Delton Coombes before his next round of treatment on 02/10/2021.  He is aware that if he has any new or worsening symptoms he can call our office for symptom management visit.

## 2021-01-27 NOTE — Progress Notes (Signed)
Nutrition Follow-up:  Patient with myelodysplastic syndrome. He is receiving Azacitidine  Met with patient during infusion. He reports ongoing fatigue, weakness, says he has no appetite. He is no longer taking appetite stimulant (Marinol), says this did nothing for him and has not taken it in months. Patient reports he is drinking 2 Ensure Original (220 kcal, 9 g protein) daily even though he does not care for the rich taste of them. He drinks one in the morning and has a glass of orange juice, sometimes he eats a piece of toast or waffle, eats a sandwich for lunch, frozen meal for dinner, and Ensure shake mixed with strawberry ice cream around 8 PM.  Yesterday he ate a waffle with butter/syrup for breakfast, [place] sandwich (mayo, lettuce, toms) with lemonade for lunch, had "almost half" of frozen beef with gravy and mashed potatoes for dinner.   Medications: reviewed  Labs: 9/6 results reviewed  Anthropometrics: Weight 125 lb 6.4 oz increased from 124 lb 9.6 oz on 9/6 decreased from 129 lb 12.8 oz on 8/22  8/16 - 126 lb 8/1 - 130 lb  7/11 - 128 lb 3.2 oz    NUTRITION DIAGNOSIS: Unintentional weight loss stable   INTERVENTION:  Encouraged small frequent meals and snacks with adequate calories and protein Reviewed strategies for adding calories and protein to foods, making the most of every bite Continue drinking Ensure, recommended consuming 3 daily and switching to Ensure Plus/equivalent for more calories and protein, coupons for Ensure and Boost provided Discussed strategies for making supplement less rich (cutting with whole milk, pouring over ice) as well as trying different brand. Patient agreeable to trying Boost Plus during infusion today Consider alternate appetite stimulant given Marinol reported not effective per patient Patient has contact information     MONITORING, EVALUATION, GOAL: weight trends, intake   NEXT VISIT: Monday September 26 during infusion

## 2021-01-27 NOTE — Progress Notes (Signed)
Patient reports today for possible blood products. Hemoglobin 7.8, patient reports feeling tired but better than last week. Tarri Abernethy, PA notified and assessed patient. Received verbal orders from [facility] [place] to transfuse 1 Unit of PRBC.   Patient tolerated blood transfusion with no complaints voiced. Port flushed with good blood return noted. No bruising or swelling at site. Bandaid applied and patient discharged in satisfactory condition. VVS stable with no signs or symptoms of distressed noted.

## 2021-01-27 NOTE — Progress Notes (Signed)
Patients port flushed without difficulty.  Good blood return noted with no bruising or swelling noted at site.  Stable during access and blood draw.  Patient to remain accessed for treatment. 

## 2021-01-28 ENCOUNTER — Ambulatory Visit (HOSPITAL_COMMUNITY): Payer: Medicare Other

## 2021-01-28 LAB — TYPE AND SCREEN
ABO/RH(D): B NEG
Antibody Screen: NEGATIVE
Unit division: 0

## 2021-01-28 LAB — BPAM RBC
Blood Product Expiration Date: 202209262359
ISSUE DATE / TIME: 202209121059
Unit Type and Rh: 1700

## 2021-01-29 ENCOUNTER — Ambulatory Visit (HOSPITAL_COMMUNITY): Payer: Medicare Other

## 2021-01-30 ENCOUNTER — Ambulatory Visit (HOSPITAL_COMMUNITY): Payer: Medicare Other

## 2021-01-31 ENCOUNTER — Ambulatory Visit (HOSPITAL_COMMUNITY): Payer: Medicare Other

## 2021-02-03 ENCOUNTER — Inpatient Hospital Stay (HOSPITAL_COMMUNITY): Payer: Medicare Other

## 2021-02-03 ENCOUNTER — Other Ambulatory Visit: Payer: Self-pay

## 2021-02-03 DIAGNOSIS — R531 Weakness: Secondary | ICD-10-CM | POA: Diagnosis not present

## 2021-02-03 DIAGNOSIS — D469 Myelodysplastic syndrome, unspecified: Secondary | ICD-10-CM

## 2021-02-03 DIAGNOSIS — R5383 Other fatigue: Secondary | ICD-10-CM | POA: Diagnosis not present

## 2021-02-03 DIAGNOSIS — Z79899 Other long term (current) drug therapy: Secondary | ICD-10-CM | POA: Diagnosis not present

## 2021-02-03 DIAGNOSIS — R634 Abnormal weight loss: Secondary | ICD-10-CM | POA: Diagnosis not present

## 2021-02-03 DIAGNOSIS — Z5111 Encounter for antineoplastic chemotherapy: Secondary | ICD-10-CM | POA: Diagnosis not present

## 2021-02-03 LAB — CBC WITH DIFFERENTIAL/PLATELET
Basophils Absolute: 0 10*3/uL (ref 0.0–0.1)
Basophils Relative: 0 %
Eosinophils Absolute: 0.1 10*3/uL (ref 0.0–0.5)
Eosinophils Relative: 4 %
HCT: 28.2 % — ABNORMAL LOW (ref 39.0–52.0)
Hemoglobin: 8.7 g/dL — ABNORMAL LOW (ref 13.0–17.0)
Lymphocytes Relative: 30 %
Lymphs Abs: 1.1 10*3/uL (ref 0.7–4.0)
MCH: 25.7 pg — ABNORMAL LOW (ref 26.0–34.0)
MCHC: 30.9 g/dL (ref 30.0–36.0)
MCV: 83.2 fL (ref 80.0–100.0)
Monocytes Absolute: 0.1 10*3/uL (ref 0.1–1.0)
Monocytes Relative: 2 %
Neutro Abs: 2.4 10*3/uL (ref 1.7–7.7)
Neutrophils Relative %: 64 %
Platelets: 66 10*3/uL — ABNORMAL LOW (ref 150–400)
RBC: 3.39 MIL/uL — ABNORMAL LOW (ref 4.22–5.81)
RDW: 16.4 % — ABNORMAL HIGH (ref 11.5–15.5)
WBC: 3.7 10*3/uL — ABNORMAL LOW (ref 4.0–10.5)
nRBC: 0.5 % — ABNORMAL HIGH (ref 0.0–0.2)
nRBC: 1 /100 WBC — ABNORMAL HIGH

## 2021-02-03 LAB — SAMPLE TO BLOOD BANK

## 2021-02-03 MED ORDER — HEPARIN SOD (PORK) LOCK FLUSH 100 UNIT/ML IV SOLN
500.0000 [IU] | Freq: Once | INTRAVENOUS | Status: AC
  Administered 2021-02-03: 500 [IU] via INTRAVENOUS

## 2021-02-03 MED ORDER — SODIUM CHLORIDE 0.9% FLUSH
10.0000 mL | Freq: Once | INTRAVENOUS | Status: AC
  Administered 2021-02-03: 10 mL via INTRAVENOUS

## 2021-02-03 NOTE — Progress Notes (Signed)
Patient presents for possible blood transfusion per providers order.  Vital signs stable.  Patients has no new complaints at this time.  Hgb noted to be 8.7,  no blood transfusion today.  Vital signs stable.  No complaints at this time.  Discharge from clinic ambulatory in stable condition.  Alert and oriented X 3.  Follow up with [facility] as scheduled.

## 2021-02-03 NOTE — Progress Notes (Signed)
Patients port flushed without difficulty.  Good blood return noted with no bruising or swelling noted at site. Stable during access and blood draw.  Patient to remain accessed for possible blood transfusion.  VSS.

## 2021-02-03 NOTE — Patient Instructions (Signed)
[facility]  Discharge Instructions: Thank you for choosing [facility] to provide your oncology and hematology care.  If you have a lab appointment with the Cancer Center, please come in thru the Main Entrance and check in at the main information desk.  Wear comfortable clothing and clothing appropriate for easy access to any Portacath or PICC line.   We strive to give you quality time with your provider. You may need to reschedule your appointment if you arrive late (15 or more minutes).  Arriving late affects you and other patients whose appointments are after yours.  Also, if you miss three or more appointments without notifying the office, you may be dismissed from the clinic at the provider's discretion.      For prescription refill requests, have your pharmacy contact our office and allow 72 hours for refills to be completed.        To help prevent nausea and vomiting after your treatment, we encourage you to take your nausea medication as directed.  BELOW ARE SYMPTOMS THAT SHOULD BE REPORTED IMMEDIATELY: *FEVER GREATER THAN 100.4 F (38 C) OR HIGHER *CHILLS OR SWEATING *NAUSEA AND VOMITING THAT IS NOT CONTROLLED WITH YOUR NAUSEA MEDICATION *UNUSUAL SHORTNESS OF BREATH *UNUSUAL BRUISING OR BLEEDING *URINARY PROBLEMS (pain or burning when urinating, or frequent urination) *BOWEL PROBLEMS (unusual diarrhea, constipation, pain near the anus) TENDERNESS IN MOUTH AND THROAT WITH OR WITHOUT PRESENCE OF ULCERS (sore throat, sores in mouth, or a toothache) UNUSUAL RASH, SWELLING OR PAIN  UNUSUAL VAGINAL DISCHARGE OR ITCHING   Items with * indicate a potential emergency and should be followed up as soon as possible or go to the Emergency Department if any problems should occur.  Please show the CHEMOTHERAPY ALERT CARD or IMMUNOTHERAPY ALERT CARD at check-in to the Emergency Department and triage nurse.  Should you have questions after your visit or need to cancel  or reschedule your appointment, please contact [facility] [phone]  and follow the prompts.  Office hours are 8:00 a.m. to 4:30 p.m. Monday - Friday. Please note that voicemails left after 4:00 p.m. may not be returned until the following business day.  We are closed weekends and major holidays. You have access to a nurse at all times for urgent questions. Please call the main number to the clinic [phone] and follow the prompts.  For any non-urgent questions, you may also contact your provider using MyChart. We now offer e-Visits for anyone 18 and older to request care online for non-urgent symptoms. For details visit [email or web address].   Also download the MyChart app! Go to the app store, search "MyChart", open the app, select [place], and log in with your MyChart username and password.  Due to Covid, a mask is required upon entering the hospital/clinic. If you do not have a mask, one will be given to you upon arrival. For doctor visits, patients may have 1 support person aged 18 or older with them. For treatment visits, patients cannot have anyone with them due to current Covid guidelines and our immunocompromised population.  

## 2021-02-09 NOTE — Progress Notes (Signed)
[name], [place] [address]   CLINIC:  Medical Oncology/Hematology  PCP:  Sharilyn Sites, [place] [address] [place] [address] [phone]   REASON FOR VISIT:  Follow-up for MDS  PRIOR THERAPY: Aranesp, Retacrit, intermittent PRBC transfusions  NGS Results: not done  CURRENT THERAPY: Azacitidine (initiated on 09/16/2020)  BRIEF ONCOLOGIC HISTORY:  Oncology History  Myelodysplastic syndrome ([place])  09/16/2020 -  Chemotherapy    Patient is on Treatment Plan: MYELODYSPLASIA  AZACITIDINE IV D1-7 Q28D         CANCER STAGING: Cancer Staging No matching staging information was found for the patient.  INTERVAL HISTORY:  [name], a 87 y.o. male, returns for routine follow-up and consideration for next cycle of chemotherapy. [name] was last seen on 12/31/2020.  Due for cycle #5 of Azacitidine today.   Overall, he tells me he has been feeling pretty well. His able to do all of his typical at-home activities, and he reports stable SOB and fatigue. His appetite is poor; he has been taking 20 mg of Megace daily which has not improved his appetite. He reports he has lost 3 pounds in the past 2 weeks. He drinks 2 Ensures daily. He denies n/v/d. He drinks about 1 liter of water daily.  Overall, he feels ready for next cycle of chemo today.   REVIEW OF SYSTEMS:  Review of Systems  Constitutional:  Positive for appetite change (25%), fatigue (25%) and unexpected weight change.  Respiratory:  Positive for shortness of breath.   Gastrointestinal:  Negative for diarrhea, nausea and vomiting.  Genitourinary:  Positive for difficulty urinating.        UTI  All other systems reviewed and are negative.  PAST MEDICAL/SURGICAL HISTORY:  Past Medical History:  Diagnosis Date   Bone cancer ([place])    Chronic kidney insufficiency    COPD (chronic obstructive pulmonary disease) (HCC)    GERD (gastroesophageal reflux disease)     Pulmonary nodule    Past Surgical History:  Procedure Laterality Date   BACK SURGERY     CATARACT EXTRACTION W/PHACO Right 03/09/2016   Procedure: CATARACT EXTRACTION PHACO AND INTRAOCULAR LENS PLACEMENT RIGHT EYE CDE=8.58;  Surgeon: Tonny Branch, MD;  Location: [facility];  Service: Ophthalmology;  Laterality: Right;  right   CATARACT EXTRACTION W/PHACO Left 04/13/2016   Procedure: CATARACT EXTRACTION PHACO AND INTRAOCULAR LENS PLACEMENT (IOC);  Surgeon: Tonny Branch, MD;  Location: [facility];  Service: Ophthalmology;  Laterality: Left;  CDE: 8.08   KYPHOPLASTY Bilateral 06/09/2019   Procedure: T11 KYPHOPLASTY;  Surgeon: Consuella Lose, MD;  Location: [facility];  Service: Neurosurgery;  Laterality: Bilateral;   None to Date  10/01/15   PORTACATH PLACEMENT Left 10/11/2020   Procedure: INSERTION PORT-A-CATH (attached catheter in left internal jugular);  Surgeon: Virl Cagey, MD;  Location: [facility];  Service: General;  Laterality: Left;    SOCIAL HISTORY:  Social History   Socioeconomic History   Marital status: Widowed    Spouse name: Not on file   Number of children: Not on file   Years of education: Not on file   Highest education level: Not on file  Occupational History   Not on file  Tobacco Use   Smoking status: Former    Years: 15.00    Types: Cigarettes, Cigars    Quit date: 10/01/1990    Years since quitting: 30.3   Smokeless tobacco: Never   Tobacco comments:    Quit  x 25-30 years; 2 cigars daily when smoked  Vaping Use   Vaping Use: Never used  Substance and Sexual Activity   Alcohol use: Not Currently    Alcohol/week: 0.0 standard drinks   Drug use: No   Sexual activity: Never    Birth control/protection: None  Other Topics Concern   Not on file  Social History Narrative   Not on file   Social Determinants of Health   Financial Resource Strain: High Risk   Difficulty of Paying Living Expenses: Hard  Food Insecurity: No Food Insecurity   Worried About Running Out  of Food in the Last Year: Never true   Ran Out of Food in the Last Year: Never true  Transportation Needs: No Transportation Needs   Lack of Transportation (Medical): No   Lack of Transportation (Non-Medical): No  Physical Activity: Inactive   Days of Exercise per Week: 0 days   Minutes of Exercise per Session: 0 min  Stress: No Stress Concern Present   Feeling of Stress : Not at all  Social Connections: Moderately Integrated   Frequency of Communication with Friends and Family: More than three times a week   Frequency of Social Gatherings with Friends and Family: More than three times a week   Attends Religious Services: More than 4 times per year   Active Member of [employer] or Organizations: Yes   Attends Archivist Meetings: More than 4 times per year   Marital Status: Widowed  Human resources officer Violence: Not At Risk   Fear of Current or Ex-Partner: No   Emotionally Abused: No   Physically Abused: No   Sexually Abused: No    FAMILY HISTORY:  Family History  Problem Relation Age of Onset   Kidney disease Mother    Kidney disease Sister    Colon cancer Neg Hx    Gastric cancer Neg Hx    Esophageal cancer Neg Hx     CURRENT MEDICATIONS:  Current Outpatient Medications  Medication Sig Dispense Refill   acetaminophen (TYLENOL) 325 MG tablet Take 2 tablets (650 mg total) by mouth every 6 (six) hours as needed for mild pain, fever or headache (or Fever >/= 101). 30 tablet 2   albuterol (VENTOLIN HFA) 108 (90 Base) MCG/ACT inhaler Inhale 2 puffs into the lungs every 4 (four) hours as needed for wheezing or shortness of breath. 18 g 2   megestrol (MEGACE) 20 MG tablet Take 1 tablet (20 mg total) by mouth daily. 30 tablet 2   ondansetron (ZOFRAN) 4 MG tablet Take 1 tablet (4 mg total) by mouth every 6 (six) hours as needed for nausea or vomiting. 12 tablet 0   pantoprazole (PROTONIX) 40 MG tablet Take 40 mg by mouth daily. Pt no longer taking     prochlorperazine (COMPAZINE)  10 MG tablet Take 1 tablet (10 mg total) by mouth every 6 (six) hours as needed for nausea or vomiting. 30 tablet 3   tamsulosin (FLOMAX) 0.4 MG CAPS capsule Take 1 capsule (0.4 mg total) by mouth daily after supper. 30 capsule 2   tiotropium (SPIRIVA HANDIHALER) 18 MCG inhalation capsule Place 1 capsule (18 mcg total) into inhaler and inhale daily. 30 capsule 12   No current facility-administered medications for this visit.   Facility-Administered Medications Ordered in Other Visits  Medication Dose Route Frequency Provider Last Rate Last Admin   0.9 %  sodium chloride infusion   Intravenous Continuous Derek Jack, MD   Stopped at 01/06/21 1018  sodium chloride flush (NS) 0.9 % injection 10 mL  10 mL Intracatheter PRN Derek Jack, MD   10 mL at 01/06/21 1215    ALLERGIES:  No Known Allergies  PHYSICAL EXAM:  Performance status (ECOG): 2 - Symptomatic, <50% confined to bed  There were no vitals filed for this visit. Wt Readings from Last 3 Encounters:  01/27/21 125 lb 6.4 oz (56.9 kg)  01/21/21 124 lb 9.6 oz (56.5 kg)  01/06/21 129 lb 12.8 oz (58.9 kg)   Physical Exam Vitals reviewed.  Constitutional:      Appearance: Normal appearance.     Interventions: Nasal cannula in place.  Cardiovascular:     Rate and Rhythm: Normal rate and regular rhythm.     Pulses: Normal pulses.     Heart sounds: Normal heart sounds.  Pulmonary:     Effort: Pulmonary effort is normal.     Breath sounds: Normal breath sounds.  Neurological:     General: No focal deficit present.     Mental Status: He is alert and oriented to person, place, and time.  Psychiatric:        Mood and Affect: Mood normal.        Behavior: Behavior normal.    LABORATORY DATA:  I have reviewed the labs as listed.  CBC Latest Ref Rng & Units 02/03/2021 01/27/2021 01/21/2021  WBC 4.0 - 10.5 K/uL 3.7(L) 3.4(L) 4.6  Hemoglobin 13.0 - 17.0 g/dL 8.7(L) 7.8(L) 8.4(L)  Hematocrit 39.0 - 52.0 % 28.2(L)  25.2(L) 27.2(L)  Platelets 150 - 400 K/uL 66(L) 57(L) 42(L)   CMP Latest Ref Rng & Units 01/21/2021 01/13/2021 01/06/2021  Glucose 70 - 99 mg/dL 94 96 98  BUN 8 - 23 mg/dL 33(H) 31(H) 42(H)  Creatinine 0.61 - 1.24 mg/dL 1.98(H) 1.69(H) 1.90(H)  Sodium 135 - 145 mmol/L 136 136 135  Potassium 3.5 - 5.1 mmol/L 3.7 4.1 4.1  Chloride 98 - 111 mmol/L 106 106 104  CO2 22 - 32 mmol/L '24 25 25  ' Calcium 8.9 - 10.3 mg/dL 10.1 9.5 9.3  Total Protein 6.5 - 8.1 g/dL 6.6 6.4(L) 6.2(L)  Total Bilirubin 0.3 - 1.2 mg/dL 1.1 0.8 0.6  Alkaline Phos 38 - 126 U/L 67 68 60  AST 15 - 41 U/L '15 16 16  ' ALT 0 - 44 U/L '13 16 13    ' DIAGNOSTIC IMAGING:  I have independently reviewed the scans and discussed with the patient. No results found.   ASSESSMENT:  1.  Myelodysplastic syndrome with pancytopenia: - Bone marrow biopsy on 12/08/2016 in [place], [place] showed normocellular marrow with trilineage hematopoiesis with erythroid predominance and dysmegakaryopoiesis.  Blasts were not increased.  Cytogenetics showed 46, XY,del(12)(p.2p13)[8]/46,XY[12].  FISH was positive for 12p-. - Bone marrow biopsy and testing in 2018 was consistent with myelodysplastic syndrome with single lineage dysplasia (MDS-SLD),IPSS-R score of 2, consistent with low risk category -  Repeat bone marrow biopsy performed on 08/13/2019 by Dr. Delton Coombes with the following results: Hypercellular bone marrow with dyspoietic changes Bone marrow aspirate with 5% blastic cells of granulocytic precursors Flow pathology with 3% blasts Peripheral blood with 2% circulating blasts Cytogenetic analysis show del(12p) and 13 of the examined cells with remaining 7 cells showing normal chromosome Karyotype: 46,XY,del(12)(p13p11.2)[13]/46,XY[7] FISH analysis was normal - Based on most recent bone marrow biopsy (08/04/2020), patient's IPSS-R is 5, showing that he has progressed to the high risk category - He has required multiple blood transfusions over the  past 6 months, most recently on 08/09/2020 (  Hgb 7.1) - No improvement on monthly Aranesp or on weekly Retacrit injections (started on 07/10/2020 - discontinued 09/04/2020) - Stool occult blood cards x3 were negative -Azacitidine (50 mg/m2) x5 days started on 09/16/2020.   PLAN:  1.  Myelodysplastic syndrome (high risk) with pancytopenia -Cycle 3 of azacitidine on 11/19/2020. - We reviewed his labs.  Low white count and platelet count has come back to baseline. - We will proceed with the azacitidine dose reduced to 50 mg per metered square for 5 days. - Continue to monitor CBC every 1 to 2 weeks for possible transfusion. - We will spread out his azacitidine to every 5 to 6 weeks.  RTC 5 weeks with labs and treatment.    2. CKD stage 3b: -Creatinine has increased to 2.32. - Recommend increased water intake.  He drinks about 1 quart per day.  Increase it to 2 quarts per day. - He will receive 100 normal saline IV daily for 2 days.  3.  Weight loss: - He lost about 2 pounds.  He reports decreased appetite and energy levels. - We will start him on Megace 400 mg twice daily.   Orders placed this encounter:  No orders of the defined types were placed in this encounter.    Derek Jack, MD [place] [phone]   I, Thana Ates, am acting as a scribe for Dr. Derek Jack.  I, Derek Jack MD, have reviewed the above documentation for accuracy and completeness, and I agree with the above.

## 2021-02-10 ENCOUNTER — Inpatient Hospital Stay (HOSPITAL_COMMUNITY): Payer: Medicare Other

## 2021-02-10 ENCOUNTER — Other Ambulatory Visit: Payer: Self-pay

## 2021-02-10 ENCOUNTER — Inpatient Hospital Stay (HOSPITAL_BASED_OUTPATIENT_CLINIC_OR_DEPARTMENT_OTHER): Payer: Medicare Other | Admitting: Hematology

## 2021-02-10 ENCOUNTER — Inpatient Hospital Stay (HOSPITAL_COMMUNITY): Payer: Medicare Other | Admitting: Dietician

## 2021-02-10 VITALS — BP 86/56 | HR 77 | Temp 97.3°F | Resp 18

## 2021-02-10 VITALS — BP 90/51 | HR 82 | Temp 96.7°F | Resp 19 | Wt 122.3 lb

## 2021-02-10 DIAGNOSIS — D469 Myelodysplastic syndrome, unspecified: Secondary | ICD-10-CM

## 2021-02-10 DIAGNOSIS — Z5111 Encounter for antineoplastic chemotherapy: Secondary | ICD-10-CM | POA: Diagnosis not present

## 2021-02-10 DIAGNOSIS — R531 Weakness: Secondary | ICD-10-CM | POA: Diagnosis not present

## 2021-02-10 DIAGNOSIS — R5383 Other fatigue: Secondary | ICD-10-CM | POA: Diagnosis not present

## 2021-02-10 DIAGNOSIS — Z79899 Other long term (current) drug therapy: Secondary | ICD-10-CM | POA: Diagnosis not present

## 2021-02-10 DIAGNOSIS — R634 Abnormal weight loss: Secondary | ICD-10-CM | POA: Diagnosis not present

## 2021-02-10 LAB — CBC WITH DIFFERENTIAL/PLATELET
Abs Immature Granulocytes: 0.08 10*3/uL — ABNORMAL HIGH (ref 0.00–0.07)
Basophils Absolute: 0 10*3/uL (ref 0.0–0.1)
Basophils Relative: 1 %
Eosinophils Absolute: 0 10*3/uL (ref 0.0–0.5)
Eosinophils Relative: 0 %
HCT: 28.5 % — ABNORMAL LOW (ref 39.0–52.0)
Hemoglobin: 8.7 g/dL — ABNORMAL LOW (ref 13.0–17.0)
Immature Granulocytes: 2 %
Lymphocytes Relative: 19 %
Lymphs Abs: 0.8 10*3/uL (ref 0.7–4.0)
MCH: 24.8 pg — ABNORMAL LOW (ref 26.0–34.0)
MCHC: 30.5 g/dL (ref 30.0–36.0)
MCV: 81.2 fL (ref 80.0–100.0)
Monocytes Absolute: 0.2 10*3/uL (ref 0.1–1.0)
Monocytes Relative: 5 %
Neutro Abs: 3.2 10*3/uL (ref 1.7–7.7)
Neutrophils Relative %: 73 %
Platelets: 61 10*3/uL — ABNORMAL LOW (ref 150–400)
RBC: 3.51 MIL/uL — ABNORMAL LOW (ref 4.22–5.81)
RDW: 16.6 % — ABNORMAL HIGH (ref 11.5–15.5)
WBC: 4.3 10*3/uL (ref 4.0–10.5)
nRBC: 0 % (ref 0.0–0.2)

## 2021-02-10 LAB — COMPREHENSIVE METABOLIC PANEL
ALT: 13 U/L (ref 0–44)
AST: 18 U/L (ref 15–41)
Albumin: 3.3 g/dL — ABNORMAL LOW (ref 3.5–5.0)
Alkaline Phosphatase: 75 U/L (ref 38–126)
Anion gap: 9 (ref 5–15)
BUN: 38 mg/dL — ABNORMAL HIGH (ref 8–23)
CO2: 22 mmol/L (ref 22–32)
Calcium: 10.3 mg/dL (ref 8.9–10.3)
Chloride: 105 mmol/L (ref 98–111)
Creatinine, Ser: 2.32 mg/dL — ABNORMAL HIGH (ref 0.61–1.24)
GFR, Estimated: 26 mL/min — ABNORMAL LOW (ref >60)
Glucose, Bld: 92 mg/dL (ref 70–99)
Potassium: 3.8 mmol/L (ref 3.5–5.1)
Sodium: 136 mmol/L (ref 135–145)
Total Bilirubin: 1.2 mg/dL (ref 0.3–1.2)
Total Protein: 7 g/dL (ref 6.5–8.1)

## 2021-02-10 LAB — LACTATE DEHYDROGENASE: LDH: 121 U/L (ref 98–192)

## 2021-02-10 MED ORDER — SODIUM CHLORIDE 0.9 % IV SOLN: INTRAVENOUS | Status: DC

## 2021-02-10 MED ORDER — MEGESTROL ACETATE 400 MG/10ML PO SUSP: 400.0000 mg | Freq: Two times a day (BID) | ORAL | 1 refills | Status: DC

## 2021-02-10 MED ORDER — SODIUM CHLORIDE 0.9% FLUSH
10.0000 mL | INTRAVENOUS | Status: DC | PRN
  Administered 2021-02-10: 10 mL

## 2021-02-10 MED ORDER — PALONOSETRON HCL INJECTION 0.25 MG/5ML
0.2500 mg | Freq: Once | INTRAVENOUS | Status: AC
  Administered 2021-02-10: 0.25 mg via INTRAVENOUS
  Filled 2021-02-10: qty 5

## 2021-02-10 MED ORDER — HEPARIN SOD (PORK) LOCK FLUSH 100 UNIT/ML IV SOLN
500.0000 [IU] | Freq: Once | INTRAVENOUS | Status: AC | PRN
  Administered 2021-02-10: 500 [IU]

## 2021-02-10 MED ORDER — SODIUM CHLORIDE 0.9 % IV SOLN
10.0000 mg | Freq: Once | INTRAVENOUS | Status: AC
  Administered 2021-02-10: 10 mg via INTRAVENOUS
  Filled 2021-02-10: qty 10

## 2021-02-10 MED ORDER — SODIUM CHLORIDE 0.9 % IV SOLN
50.0000 mg/m2 | Freq: Once | INTRAVENOUS | Status: AC
  Administered 2021-02-10: 90 mg via INTRAVENOUS
  Filled 2021-02-10: qty 9

## 2021-02-10 MED ORDER — SODIUM CHLORIDE 0.9 % IV SOLN: Freq: Once | INTRAVENOUS | Status: AC

## 2021-02-10 NOTE — Patient Instructions (Addendum)
[place] at [facility] Discharge Instructions  You were seen today by Dr. Delton Coombes. He went over your recent results, and you received your treatment. Stay hydrated by drinking at least 2 quarts of fluid a day. Dr. Delton Coombes will see you back in 5 weeks for labs and follow up.   Thank you for choosing [facility] to provide your oncology and hematology care.  To afford each patient quality time with our provider, please arrive at least 15 minutes before your scheduled appointment time.   If you have a lab appointment with the [facility] please come in thru the Main Entrance and check in at the main information desk  You need to re-schedule your appointment should you arrive 10 or more minutes late.  We strive to give you quality time with our providers, and arriving late affects you and other patients whose appointments are after yours.  Also, if you no show three or more times for appointments you may be dismissed from the clinic at the providers discretion.     Again, thank you for choosing [facility].  Our hope is that these requests will decrease the amount of time that you wait before being seen by our physicians.       _____________________________________________________________  Should you have questions after your visit to [facility], please contact our office at [phone] between the hours of 8:00 a.m. and 4:30 p.m.  Voicemails left after 4:00 p.m. will not be returned until the following business day.  For prescription refill requests, have your pharmacy contact our office and allow 72 hours.    Cancer Center Support Programs:   > Cancer Support Group  2nd Tuesday of the month 1pm-2pm, Journey Room

## 2021-02-10 NOTE — Patient Instructions (Signed)
[facility]  Discharge Instructions: Thank you for choosing [facility] to provide your oncology and hematology care.  If you have a lab appointment with the Cancer Center, please come in thru the Main Entrance and check in at the main information desk.  Wear comfortable clothing and clothing appropriate for easy access to any Portacath or PICC line.   We strive to give you quality time with your provider. You may need to reschedule your appointment if you arrive late (15 or more minutes).  Arriving late affects you and other patients whose appointments are after yours.  Also, if you miss three or more appointments without notifying the office, you may be dismissed from the clinic at the provider's discretion.      For prescription refill requests, have your pharmacy contact our office and allow 72 hours for refills to be completed.        To help prevent nausea and vomiting after your treatment, we encourage you to take your nausea medication as directed.  BELOW ARE SYMPTOMS THAT SHOULD BE REPORTED IMMEDIATELY: *FEVER GREATER THAN 100.4 F (38 C) OR HIGHER *CHILLS OR SWEATING *NAUSEA AND VOMITING THAT IS NOT CONTROLLED WITH YOUR NAUSEA MEDICATION *UNUSUAL SHORTNESS OF BREATH *UNUSUAL BRUISING OR BLEEDING *URINARY PROBLEMS (pain or burning when urinating, or frequent urination) *BOWEL PROBLEMS (unusual diarrhea, constipation, pain near the anus) TENDERNESS IN MOUTH AND THROAT WITH OR WITHOUT PRESENCE OF ULCERS (sore throat, sores in mouth, or a toothache) UNUSUAL RASH, SWELLING OR PAIN  UNUSUAL VAGINAL DISCHARGE OR ITCHING   Items with * indicate a potential emergency and should be followed up as soon as possible or go to the Emergency Department if any problems should occur.  Please show the CHEMOTHERAPY ALERT CARD or IMMUNOTHERAPY ALERT CARD at check-in to the Emergency Department and triage nurse.  Should you have questions after your visit or need to cancel  or reschedule your appointment, please contact [facility] [phone]  and follow the prompts.  Office hours are 8:00 a.m. to 4:30 p.m. Monday - Friday. Please note that voicemails left after 4:00 p.m. may not be returned until the following business day.  We are closed weekends and major holidays. You have access to a nurse at all times for urgent questions. Please call the main number to the clinic [phone] and follow the prompts.  For any non-urgent questions, you may also contact your provider using MyChart. We now offer e-Visits for anyone 18 and older to request care online for non-urgent symptoms. For details visit [email or web address].   Also download the MyChart app! Go to the app store, search "MyChart", open the app, select [facility], and log in with your MyChart username and password.  Due to Covid, a mask is required upon entering the hospital/clinic. If you do not have a mask, one will be given to you upon arrival. For doctor visits, patients may have 1 support person aged 18 or older with them. For treatment visits, patients cannot have anyone with them due to current Covid guidelines and our immunocompromised population.  

## 2021-02-10 NOTE — Progress Notes (Signed)
Patient presents today for chemotherapy infusion.  Vital signs are stable.  Labs reviewed by Dr. Delton Coombes during his office visit.  Creatinine today is 2.32.  Patient will get NS 500 mL over one hour today per MD orders.  We will proceed with treatment per MD orders.   Patient tolerated treatment well with no complaints voiced.  Patient left via motorized wheelchair in stable condition.  Vital signs stable at discharge.  Follow up as scheduled.

## 2021-02-10 NOTE — Progress Notes (Signed)
Patient has been examined, vital signs and labs have been reviewed by Dr. Katragadda. ANC, Creatinine, LFTs, hemoglobin, and platelets are within treatment parameters per Dr. Katragadda. Patient is okay to proceed with treatment per M.D.   

## 2021-02-10 NOTE — Progress Notes (Signed)
Nutrition Follow-up:  Patient with myelodysplastic syndrome. He is receiving Azacitidine.  Met with patient during infusion. He reports ongoing poor appetite. Patient has been eating 3 small meals and continues to drink 2 Ensure. Yesterday he had half of a waffle and Ensure for breakfast, half a hamburger for lunch, and a little bit of a pork chop and baked potato for dinner. Patient reports he will work to increase supplement intake this week. He reports MD has prescribed a different appetite stimulant today, says the Marinol did not work for him.   Medications: Megace  Labs: BUN 38, Cr 2.32  Anthropometrics: Weight 122 lb 4.8 oz today decreased 2.4% from 125 lb 6.4 oz on 9/12 and 5.4% from 129 lb 12.8 oz on 8/22. This is significant   NUTRITION DIAGNOSIS: Unintentional weight loss ongoing   INTERVENTION:  Reinforced importance of adequate calorie and protein energy intake Encouraged small frequent meals and snacks and reviewed strategies for poor appetite Patient agrees to drink 3 Ensure Plus or equivalent daily, coupons provided  Appetite stimulant prescribed by MD  Patient has contact information    MONITORING, EVALUATION, GOAL: weight trends, intake   NEXT VISIT: Monday October 10 during infusion

## 2021-02-11 ENCOUNTER — Inpatient Hospital Stay (HOSPITAL_COMMUNITY): Payer: Medicare Other

## 2021-02-11 VITALS — BP 98/55 | HR 71 | Temp 97.0°F | Resp 18

## 2021-02-11 DIAGNOSIS — R531 Weakness: Secondary | ICD-10-CM | POA: Diagnosis not present

## 2021-02-11 DIAGNOSIS — Z5111 Encounter for antineoplastic chemotherapy: Secondary | ICD-10-CM | POA: Diagnosis not present

## 2021-02-11 DIAGNOSIS — D469 Myelodysplastic syndrome, unspecified: Secondary | ICD-10-CM | POA: Diagnosis not present

## 2021-02-11 DIAGNOSIS — R634 Abnormal weight loss: Secondary | ICD-10-CM | POA: Diagnosis not present

## 2021-02-11 DIAGNOSIS — R5383 Other fatigue: Secondary | ICD-10-CM | POA: Diagnosis not present

## 2021-02-11 DIAGNOSIS — Z79899 Other long term (current) drug therapy: Secondary | ICD-10-CM | POA: Diagnosis not present

## 2021-02-11 LAB — SAMPLE TO BLOOD BANK

## 2021-02-11 MED ORDER — SODIUM CHLORIDE 0.9% FLUSH
10.0000 mL | INTRAVENOUS | Status: DC | PRN
  Administered 2021-02-11: 10 mL

## 2021-02-11 MED ORDER — SODIUM CHLORIDE 0.9 % IV SOLN
10.0000 mg | Freq: Once | INTRAVENOUS | Status: AC
  Administered 2021-02-11: 10 mg via INTRAVENOUS
  Filled 2021-02-11: qty 10

## 2021-02-11 MED ORDER — SODIUM CHLORIDE 0.9 % IV SOLN: Freq: Once | INTRAVENOUS | Status: AC

## 2021-02-11 MED ORDER — HEPARIN SOD (PORK) LOCK FLUSH 100 UNIT/ML IV SOLN
500.0000 [IU] | Freq: Once | INTRAVENOUS | Status: AC | PRN
  Administered 2021-02-11: 500 [IU]

## 2021-02-11 MED ORDER — SODIUM CHLORIDE 0.9 % IV SOLN
50.0000 mg/m2 | Freq: Once | INTRAVENOUS | Status: AC
  Administered 2021-02-11: 90 mg via INTRAVENOUS
  Filled 2021-02-11: qty 9

## 2021-02-11 NOTE — Patient Instructions (Signed)
[facility]  Discharge Instructions: Thank you for choosing [facility] to provide your oncology and hematology care.  If you have a lab appointment with the Cancer Center, please come in thru the Main Entrance and check in at the main information desk.  Wear comfortable clothing and clothing appropriate for easy access to any Portacath or PICC line.   We strive to give you quality time with your provider. You may need to reschedule your appointment if you arrive late (15 or more minutes).  Arriving late affects you and other patients whose appointments are after yours.  Also, if you miss three or more appointments without notifying the office, you may be dismissed from the clinic at the provider's discretion.      For prescription refill requests, have your pharmacy contact our office and allow 72 hours for refills to be completed.        To help prevent nausea and vomiting after your treatment, we encourage you to take your nausea medication as directed.  BELOW ARE SYMPTOMS THAT SHOULD BE REPORTED IMMEDIATELY: *FEVER GREATER THAN 100.4 F (38 C) OR HIGHER *CHILLS OR SWEATING *NAUSEA AND VOMITING THAT IS NOT CONTROLLED WITH YOUR NAUSEA MEDICATION *UNUSUAL SHORTNESS OF BREATH *UNUSUAL BRUISING OR BLEEDING *URINARY PROBLEMS (pain or burning when urinating, or frequent urination) *BOWEL PROBLEMS (unusual diarrhea, constipation, pain near the anus) TENDERNESS IN MOUTH AND THROAT WITH OR WITHOUT PRESENCE OF ULCERS (sore throat, sores in mouth, or a toothache) UNUSUAL RASH, SWELLING OR PAIN  UNUSUAL VAGINAL DISCHARGE OR ITCHING   Items with * indicate a potential emergency and should be followed up as soon as possible or go to the Emergency Department if any problems should occur.  Please show the CHEMOTHERAPY ALERT CARD or IMMUNOTHERAPY ALERT CARD at check-in to the Emergency Department and triage nurse.  Should you have questions after your visit or need to cancel  or reschedule your appointment, please contact [facility] [phone]  and follow the prompts.  Office hours are 8:00 a.m. to 4:30 p.m. Monday - Friday. Please note that voicemails left after 4:00 p.m. may not be returned until the following business day.  We are closed weekends and major holidays. You have access to a nurse at all times for urgent questions. Please call the main number to the clinic [phone] and follow the prompts.  For any non-urgent questions, you may also contact your provider using MyChart. We now offer e-Visits for anyone 18 and older to request care online for non-urgent symptoms. For details visit [email or web address].   Also download the MyChart app! Go to the app store, search "MyChart", open the app, select [facility], and log in with your MyChart username and password.  Due to Covid, a mask is required upon entering the hospital/clinic. If you do not have a mask, one will be given to you upon arrival. For doctor visits, patients may have 1 support person aged 18 or older with them. For treatment visits, patients cannot have anyone with them due to current Covid guidelines and our immunocompromised population.  

## 2021-02-11 NOTE — Progress Notes (Signed)
Patient presents today for day two of Vidaza chemotherapy infusion.  Patient is in satisfactory condition with no new complaints today.  Vital signs are stable.  We will proceed with treatment per MD orders.    Patient presents today for treatment per orders.  Patient tolerated treatment well with no complaints voiced.  Patient left via motorized wheelchair in stable condition.  Vital signs stable at discharge.  Follow up as scheduled.

## 2021-02-12 ENCOUNTER — Inpatient Hospital Stay (HOSPITAL_COMMUNITY): Payer: Medicare Other

## 2021-02-12 VITALS — BP 93/46 | HR 69 | Temp 96.8°F | Resp 18

## 2021-02-12 DIAGNOSIS — D469 Myelodysplastic syndrome, unspecified: Secondary | ICD-10-CM

## 2021-02-12 DIAGNOSIS — Z5111 Encounter for antineoplastic chemotherapy: Secondary | ICD-10-CM | POA: Diagnosis not present

## 2021-02-12 DIAGNOSIS — R5383 Other fatigue: Secondary | ICD-10-CM | POA: Diagnosis not present

## 2021-02-12 DIAGNOSIS — R634 Abnormal weight loss: Secondary | ICD-10-CM | POA: Diagnosis not present

## 2021-02-12 DIAGNOSIS — Z79899 Other long term (current) drug therapy: Secondary | ICD-10-CM | POA: Diagnosis not present

## 2021-02-12 DIAGNOSIS — R531 Weakness: Secondary | ICD-10-CM | POA: Diagnosis not present

## 2021-02-12 MED ORDER — PALONOSETRON HCL INJECTION 0.25 MG/5ML
0.2500 mg | Freq: Once | INTRAVENOUS | Status: AC
  Administered 2021-02-12: 0.25 mg via INTRAVENOUS
  Filled 2021-02-12: qty 5

## 2021-02-12 MED ORDER — SODIUM CHLORIDE 0.9 % IV SOLN
50.0000 mg/m2 | Freq: Once | INTRAVENOUS | Status: AC
  Administered 2021-02-12: 90 mg via INTRAVENOUS
  Filled 2021-02-12: qty 9

## 2021-02-12 MED ORDER — HEPARIN SOD (PORK) LOCK FLUSH 100 UNIT/ML IV SOLN
500.0000 [IU] | Freq: Once | INTRAVENOUS | Status: AC | PRN
  Administered 2021-02-12: 500 [IU]

## 2021-02-12 MED ORDER — SODIUM CHLORIDE 0.9 % IV SOLN: Freq: Once | INTRAVENOUS | Status: AC

## 2021-02-12 MED ORDER — SODIUM CHLORIDE 0.9 % IV SOLN
10.0000 mg | Freq: Once | INTRAVENOUS | Status: AC
  Administered 2021-02-12: 10 mg via INTRAVENOUS
  Filled 2021-02-12: qty 10

## 2021-02-12 MED ORDER — SODIUM CHLORIDE 0.9% FLUSH
10.0000 mL | INTRAVENOUS | Status: DC | PRN
  Administered 2021-02-12: 10 mL

## 2021-02-12 NOTE — Patient Instructions (Signed)
[facility]  Discharge Instructions: Thank you for choosing [facility] to provide your oncology and hematology care.  If you have a lab appointment with the Cancer Center, please come in thru the Main Entrance and check in at the main information desk.  Wear comfortable clothing and clothing appropriate for easy access to any Portacath or PICC line.   We strive to give you quality time with your provider. You may need to reschedule your appointment if you arrive late (15 or more minutes).  Arriving late affects you and other patients whose appointments are after yours.  Also, if you miss three or more appointments without notifying the office, you may be dismissed from the clinic at the provider's discretion.      For prescription refill requests, have your pharmacy contact our office and allow 72 hours for refills to be completed.        To help prevent nausea and vomiting after your treatment, we encourage you to take your nausea medication as directed.  BELOW ARE SYMPTOMS THAT SHOULD BE REPORTED IMMEDIATELY: *FEVER GREATER THAN 100.4 F (38 C) OR HIGHER *CHILLS OR SWEATING *NAUSEA AND VOMITING THAT IS NOT CONTROLLED WITH YOUR NAUSEA MEDICATION *UNUSUAL SHORTNESS OF BREATH *UNUSUAL BRUISING OR BLEEDING *URINARY PROBLEMS (pain or burning when urinating, or frequent urination) *BOWEL PROBLEMS (unusual diarrhea, constipation, pain near the anus) TENDERNESS IN MOUTH AND THROAT WITH OR WITHOUT PRESENCE OF ULCERS (sore throat, sores in mouth, or a toothache) UNUSUAL RASH, SWELLING OR PAIN  UNUSUAL VAGINAL DISCHARGE OR ITCHING   Items with * indicate a potential emergency and should be followed up as soon as possible or go to the Emergency Department if any problems should occur.  Please show the CHEMOTHERAPY ALERT CARD or IMMUNOTHERAPY ALERT CARD at check-in to the Emergency Department and triage nurse.  Should you have questions after your visit or need to cancel  or reschedule your appointment, please contact [facility] [phone]  and follow the prompts.  Office hours are 8:00 a.m. to 4:30 p.m. Monday - Friday. Please note that voicemails left after 4:00 p.m. may not be returned until the following business day.  We are closed weekends and major holidays. You have access to a nurse at all times for urgent questions. Please call the main number to the clinic [phone] and follow the prompts.  For any non-urgent questions, you may also contact your provider using MyChart. We now offer e-Visits for anyone 18 and older to request care online for non-urgent symptoms. For details visit [email or web address].   Also download the MyChart app! Go to the app store, search "MyChart", open the app, select [facility], and log in with your MyChart username and password.  Due to Covid, a mask is required upon entering the hospital/clinic. If you do not have a mask, one will be given to you upon arrival. For doctor visits, patients may have 1 support person aged 18 or older with them. For treatment visits, patients cannot have anyone with them due to current Covid guidelines and our immunocompromised population.  

## 2021-02-12 NOTE — Progress Notes (Signed)
Patient presents today for day three of Vidaza chemotherapy infusion.  Patient is in satisfactory condition with no new complaints today.  BP today is 82/37.  All other vital signs are stable.  We will proceed with treatment per MD orders.    Blood pressure recheck= 95/51.   Patient tolerated treatment well with no complaints voiced.  Patient left via motorized wheelchair in stable condition.  Vital signs stable at discharge.  Follow up as scheduled.

## 2021-02-13 ENCOUNTER — Inpatient Hospital Stay (HOSPITAL_COMMUNITY): Payer: Medicare Other

## 2021-02-13 ENCOUNTER — Other Ambulatory Visit: Payer: Self-pay

## 2021-02-13 VITALS — BP 90/52 | HR 50 | Temp 96.9°F | Resp 16

## 2021-02-13 DIAGNOSIS — Z79899 Other long term (current) drug therapy: Secondary | ICD-10-CM | POA: Diagnosis not present

## 2021-02-13 DIAGNOSIS — R531 Weakness: Secondary | ICD-10-CM | POA: Diagnosis not present

## 2021-02-13 DIAGNOSIS — Z5111 Encounter for antineoplastic chemotherapy: Secondary | ICD-10-CM | POA: Diagnosis not present

## 2021-02-13 DIAGNOSIS — D469 Myelodysplastic syndrome, unspecified: Secondary | ICD-10-CM | POA: Diagnosis not present

## 2021-02-13 DIAGNOSIS — R634 Abnormal weight loss: Secondary | ICD-10-CM | POA: Diagnosis not present

## 2021-02-13 DIAGNOSIS — R5383 Other fatigue: Secondary | ICD-10-CM | POA: Diagnosis not present

## 2021-02-13 MED ORDER — HEPARIN SOD (PORK) LOCK FLUSH 100 UNIT/ML IV SOLN
500.0000 [IU] | Freq: Once | INTRAVENOUS | Status: AC | PRN
Start: 2021-02-13 — End: 2021-02-13
  Administered 2021-02-13: 500 [IU]

## 2021-02-13 MED ORDER — SODIUM CHLORIDE 0.9% FLUSH
10.0000 mL | INTRAVENOUS | Status: DC | PRN
  Administered 2021-02-13: 10 mL

## 2021-02-13 MED ORDER — SODIUM CHLORIDE 0.9 % IV SOLN: Freq: Once | INTRAVENOUS | Status: AC

## 2021-02-13 MED ORDER — SODIUM CHLORIDE 0.9 % IV SOLN
50.0000 mg/m2 | Freq: Once | INTRAVENOUS | Status: AC
  Administered 2021-02-13: 90 mg via INTRAVENOUS
  Filled 2021-02-13: qty 9

## 2021-02-13 MED ORDER — SODIUM CHLORIDE 0.9 % IV SOLN
10.0000 mg | Freq: Once | INTRAVENOUS | Status: AC
  Administered 2021-02-13: 10 mg via INTRAVENOUS
  Filled 2021-02-13: qty 10

## 2021-02-13 NOTE — Patient Instructions (Signed)
[facility]  Discharge Instructions: Thank you for choosing [facility] to provide your oncology and hematology care.  If you have a lab appointment with the Cancer Center, please come in thru the Main Entrance and check in at the main information desk.  Wear comfortable clothing and clothing appropriate for easy access to any Portacath or PICC line.   We strive to give you quality time with your provider. You may need to reschedule your appointment if you arrive late (15 or more minutes).  Arriving late affects you and other patients whose appointments are after yours.  Also, if you miss three or more appointments without notifying the office, you may be dismissed from the clinic at the provider's discretion.      For prescription refill requests, have your pharmacy contact our office and allow 72 hours for refills to be completed.        To help prevent nausea and vomiting after your treatment, we encourage you to take your nausea medication as directed.  BELOW ARE SYMPTOMS THAT SHOULD BE REPORTED IMMEDIATELY: *FEVER GREATER THAN 100.4 F (38 C) OR HIGHER *CHILLS OR SWEATING *NAUSEA AND VOMITING THAT IS NOT CONTROLLED WITH YOUR NAUSEA MEDICATION *UNUSUAL SHORTNESS OF BREATH *UNUSUAL BRUISING OR BLEEDING *URINARY PROBLEMS (pain or burning when urinating, or frequent urination) *BOWEL PROBLEMS (unusual diarrhea, constipation, pain near the anus) TENDERNESS IN MOUTH AND THROAT WITH OR WITHOUT PRESENCE OF ULCERS (sore throat, sores in mouth, or a toothache) UNUSUAL RASH, SWELLING OR PAIN  UNUSUAL VAGINAL DISCHARGE OR ITCHING   Items with * indicate a potential emergency and should be followed up as soon as possible or go to the Emergency Department if any problems should occur.  Please show the CHEMOTHERAPY ALERT CARD or IMMUNOTHERAPY ALERT CARD at check-in to the Emergency Department and triage nurse.  Should you have questions after your visit or need to cancel  or reschedule your appointment, please contact [facility] [phone]  and follow the prompts.  Office hours are 8:00 a.m. to 4:30 p.m. Monday - Friday. Please note that voicemails left after 4:00 p.m. may not be returned until the following business day.  We are closed weekends and major holidays. You have access to a nurse at all times for urgent questions. Please call the main number to the clinic [phone] and follow the prompts.  For any non-urgent questions, you may also contact your provider using MyChart. We now offer e-Visits for anyone 18 and older to request care online for non-urgent symptoms. For details visit [email or web address].   Also download the MyChart app! Go to the app store, search "MyChart", open the app, select [place], and log in with your MyChart username and password.  Due to Covid, a mask is required upon entering the hospital/clinic. If you do not have a mask, one will be given to you upon arrival. For doctor visits, patients may have 1 support person aged 18 or older with them. For treatment visits, patients cannot have anyone with them due to current Covid guidelines and our immunocompromised population.  

## 2021-02-13 NOTE — Progress Notes (Signed)
Patient presents today for day four of Vidaza infusion.  Patient is in satisfactory condition with no new complaints today.  Vital signs are stable. We will proceed with treatment per MD orders.   Patient tolerated treatment well with no complaints voiced.  Patient left via motorized wheelchair in stable condition.  Vital signs stable at discharge.  Follow up as scheduled.

## 2021-02-14 ENCOUNTER — Inpatient Hospital Stay (HOSPITAL_COMMUNITY): Payer: Medicare Other

## 2021-02-14 VITALS — BP 96/49 | HR 74 | Temp 97.2°F | Resp 18

## 2021-02-14 DIAGNOSIS — Z79899 Other long term (current) drug therapy: Secondary | ICD-10-CM | POA: Diagnosis not present

## 2021-02-14 DIAGNOSIS — R531 Weakness: Secondary | ICD-10-CM | POA: Diagnosis not present

## 2021-02-14 DIAGNOSIS — R5383 Other fatigue: Secondary | ICD-10-CM | POA: Diagnosis not present

## 2021-02-14 DIAGNOSIS — R634 Abnormal weight loss: Secondary | ICD-10-CM | POA: Diagnosis not present

## 2021-02-14 DIAGNOSIS — D469 Myelodysplastic syndrome, unspecified: Secondary | ICD-10-CM | POA: Diagnosis not present

## 2021-02-14 DIAGNOSIS — Z5111 Encounter for antineoplastic chemotherapy: Secondary | ICD-10-CM | POA: Diagnosis not present

## 2021-02-14 MED ORDER — PALONOSETRON HCL INJECTION 0.25 MG/5ML
0.2500 mg | Freq: Once | INTRAVENOUS | Status: AC
  Administered 2021-02-14: 0.25 mg via INTRAVENOUS
  Filled 2021-02-14: qty 5

## 2021-02-14 MED ORDER — SODIUM CHLORIDE 0.9 % IV SOLN
50.0000 mg/m2 | Freq: Once | INTRAVENOUS | Status: AC
  Administered 2021-02-14: 90 mg via INTRAVENOUS
  Filled 2021-02-14: qty 9

## 2021-02-14 MED ORDER — SODIUM CHLORIDE 0.9 % IV SOLN
10.0000 mg | Freq: Once | INTRAVENOUS | Status: AC
  Administered 2021-02-14: 10 mg via INTRAVENOUS
  Filled 2021-02-14: qty 10

## 2021-02-14 MED ORDER — SODIUM CHLORIDE 0.9 % IV SOLN: Freq: Once | INTRAVENOUS | Status: AC

## 2021-02-14 MED ORDER — SODIUM CHLORIDE 0.9% FLUSH
10.0000 mL | INTRAVENOUS | Status: DC | PRN
  Administered 2021-02-14: 10 mL

## 2021-02-14 MED ORDER — HEPARIN SOD (PORK) LOCK FLUSH 100 UNIT/ML IV SOLN
500.0000 [IU] | Freq: Once | INTRAVENOUS | Status: AC | PRN
  Administered 2021-02-14: 500 [IU]

## 2021-02-14 NOTE — Progress Notes (Signed)
Patient presents today for day five of Vidaza infusion.  Patient is in satisfactory condition with no new complaints voiced.  Vital signs are stable.  We will proceed with treatment per MD orders.   Patient tolerated treatment well with no complaints voiced.  Patient left via motorized wheelchair in stable condition.  Vital signs stable at discharge.  Follow up as scheduled.

## 2021-02-14 NOTE — Patient Instructions (Signed)
[facility]  Discharge Instructions: Thank you for choosing [facility] to provide your oncology and hematology care.  If you have a lab appointment with the Cancer Center, please come in thru the Main Entrance and check in at the main information desk.  Wear comfortable clothing and clothing appropriate for easy access to any Portacath or PICC line.   We strive to give you quality time with your provider. You may need to reschedule your appointment if you arrive late (15 or more minutes).  Arriving late affects you and other patients whose appointments are after yours.  Also, if you miss three or more appointments without notifying the office, you may be dismissed from the clinic at the provider's discretion.      For prescription refill requests, have your pharmacy contact our office and allow 72 hours for refills to be completed.        To help prevent nausea and vomiting after your treatment, we encourage you to take your nausea medication as directed.  BELOW ARE SYMPTOMS THAT SHOULD BE REPORTED IMMEDIATELY: *FEVER GREATER THAN 100.4 F (38 C) OR HIGHER *CHILLS OR SWEATING *NAUSEA AND VOMITING THAT IS NOT CONTROLLED WITH YOUR NAUSEA MEDICATION *UNUSUAL SHORTNESS OF BREATH *UNUSUAL BRUISING OR BLEEDING *URINARY PROBLEMS (pain or burning when urinating, or frequent urination) *BOWEL PROBLEMS (unusual diarrhea, constipation, pain near the anus) TENDERNESS IN MOUTH AND THROAT WITH OR WITHOUT PRESENCE OF ULCERS (sore throat, sores in mouth, or a toothache) UNUSUAL RASH, SWELLING OR PAIN  UNUSUAL VAGINAL DISCHARGE OR ITCHING   Items with * indicate a potential emergency and should be followed up as soon as possible or go to the Emergency Department if any problems should occur.  Please show the CHEMOTHERAPY ALERT CARD or IMMUNOTHERAPY ALERT CARD at check-in to the Emergency Department and triage nurse.  Should you have questions after your visit or need to cancel  or reschedule your appointment, please contact [facility] [phone]  and follow the prompts.  Office hours are 8:00 a.m. to 4:30 p.m. Monday - Friday. Please note that voicemails left after 4:00 p.m. may not be returned until the following business day.  We are closed weekends and major holidays. You have access to a nurse at all times for urgent questions. Please call the main number to the clinic [phone] and follow the prompts.  For any non-urgent questions, you may also contact your provider using MyChart. We now offer e-Visits for anyone 18 and older to request care online for non-urgent symptoms. For details visit [email or web address].   Also download the MyChart app! Go to the app store, search "MyChart", open the app, select [facility], and log in with your MyChart username and password.  Due to Covid, a mask is required upon entering the hospital/clinic. If you do not have a mask, one will be given to you upon arrival. For doctor visits, patients may have 1 support person aged 18 or older with them. For treatment visits, patients cannot have anyone with them due to current Covid guidelines and our immunocompromised population.  

## 2021-02-15 ENCOUNTER — Other Ambulatory Visit: Payer: Self-pay

## 2021-02-15 ENCOUNTER — Emergency Department (HOSPITAL_COMMUNITY): Payer: Medicare Other

## 2021-02-15 ENCOUNTER — Observation Stay (HOSPITAL_COMMUNITY)
Admission: EM | Admit: 2021-02-15 | Discharge: 2021-02-16 | Disposition: A | Discharge status: Home / Self Care | Payer: Medicare Other | Attending: Family Medicine | Admitting: Family Medicine

## 2021-02-15 ENCOUNTER — Encounter (HOSPITAL_COMMUNITY): Payer: Self-pay | Admitting: Emergency Medicine

## 2021-02-15 DIAGNOSIS — Z87891 Personal history of nicotine dependence: Secondary | ICD-10-CM | POA: Diagnosis not present

## 2021-02-15 DIAGNOSIS — I129 Hypertensive chronic kidney disease with stage 1 through stage 4 chronic kidney disease, or unspecified chronic kidney disease: Secondary | ICD-10-CM | POA: Diagnosis not present

## 2021-02-15 DIAGNOSIS — Z20822 Contact with and (suspected) exposure to covid-19: Secondary | ICD-10-CM | POA: Insufficient documentation

## 2021-02-15 DIAGNOSIS — R0789 Other chest pain: Secondary | ICD-10-CM | POA: Diagnosis not present

## 2021-02-15 DIAGNOSIS — J439 Emphysema, unspecified: Secondary | ICD-10-CM | POA: Diagnosis not present

## 2021-02-15 DIAGNOSIS — R079 Chest pain, unspecified: Secondary | ICD-10-CM | POA: Diagnosis present

## 2021-02-15 DIAGNOSIS — Z79899 Other long term (current) drug therapy: Secondary | ICD-10-CM | POA: Diagnosis not present

## 2021-02-15 DIAGNOSIS — J449 Chronic obstructive pulmonary disease, unspecified: Secondary | ICD-10-CM | POA: Diagnosis present

## 2021-02-15 DIAGNOSIS — D649 Anemia, unspecified: Secondary | ICD-10-CM | POA: Diagnosis present

## 2021-02-15 DIAGNOSIS — Z8616 Personal history of COVID-19: Secondary | ICD-10-CM | POA: Diagnosis not present

## 2021-02-15 DIAGNOSIS — R0600 Dyspnea, unspecified: Secondary | ICD-10-CM

## 2021-02-15 DIAGNOSIS — J440 Chronic obstructive pulmonary disease with acute lower respiratory infection: Secondary | ICD-10-CM | POA: Diagnosis not present

## 2021-02-15 DIAGNOSIS — Z85830 Personal history of malignant neoplasm of bone: Secondary | ICD-10-CM | POA: Diagnosis not present

## 2021-02-15 DIAGNOSIS — N1832 Chronic kidney disease, stage 3b: Secondary | ICD-10-CM | POA: Insufficient documentation

## 2021-02-15 DIAGNOSIS — R0602 Shortness of breath: Secondary | ICD-10-CM

## 2021-02-15 DIAGNOSIS — D631 Anemia in chronic kidney disease: Secondary | ICD-10-CM | POA: Insufficient documentation

## 2021-02-15 DIAGNOSIS — R1310 Dysphagia, unspecified: Secondary | ICD-10-CM

## 2021-02-15 DIAGNOSIS — I700 Atherosclerosis of aorta: Secondary | ICD-10-CM | POA: Diagnosis not present

## 2021-02-15 LAB — BASIC METABOLIC PANEL
Anion gap: 5 (ref 5–15)
BUN: 40 mg/dL — ABNORMAL HIGH (ref 8–23)
CO2: 23 mmol/L (ref 22–32)
Calcium: 8.8 mg/dL — ABNORMAL LOW (ref 8.9–10.3)
Chloride: 108 mmol/L (ref 98–111)
Creatinine, Ser: 1.79 mg/dL — ABNORMAL HIGH (ref 0.61–1.24)
GFR, Estimated: 36 mL/min — ABNORMAL LOW (ref >60)
Glucose, Bld: 82 mg/dL (ref 70–99)
Potassium: 3.8 mmol/L (ref 3.5–5.1)
Sodium: 136 mmol/L (ref 135–145)

## 2021-02-15 LAB — CBC
HCT: 23.7 % — ABNORMAL LOW (ref 39.0–52.0)
Hemoglobin: 7.5 g/dL — ABNORMAL LOW (ref 13.0–17.0)
MCH: 25.5 pg — ABNORMAL LOW (ref 26.0–34.0)
MCHC: 31.6 g/dL (ref 30.0–36.0)
MCV: 80.6 fL (ref 80.0–100.0)
Platelets: 78 10*3/uL — ABNORMAL LOW (ref 150–400)
RBC: 2.94 MIL/uL — ABNORMAL LOW (ref 4.22–5.81)
RDW: 18.2 % — ABNORMAL HIGH (ref 11.5–15.5)
WBC: 5.9 10*3/uL (ref 4.0–10.5)
nRBC: 0.7 % — ABNORMAL HIGH (ref 0.0–0.2)

## 2021-02-15 LAB — HEPATIC FUNCTION PANEL
ALT: 13 U/L (ref 0–44)
AST: 16 U/L (ref 15–41)
Albumin: 3.3 g/dL — ABNORMAL LOW (ref 3.5–5.0)
Alkaline Phosphatase: 62 U/L (ref 38–126)
Bilirubin, Direct: 0.1 mg/dL (ref 0.0–0.2)
Indirect Bilirubin: 0.6 mg/dL (ref 0.3–0.9)
Total Bilirubin: 0.7 mg/dL (ref 0.3–1.2)
Total Protein: 6.5 g/dL (ref 6.5–8.1)

## 2021-02-15 LAB — BRAIN NATRIURETIC PEPTIDE: B Natriuretic Peptide: 231 pg/mL — ABNORMAL HIGH (ref 0.0–100.0)

## 2021-02-15 LAB — TROPONIN I (HIGH SENSITIVITY)
Troponin I (High Sensitivity): 13 ng/L (ref <18)
Troponin I (High Sensitivity): 14 ng/L (ref <18)

## 2021-02-15 NOTE — ED Provider Notes (Signed)
[facility] EMERGENCY DEPARTMENT Provider Note   CSN: [id number] Arrival date & time: 02/15/21  1924     History Chief Complaint  Patient presents with   Chest Pain   Shortness of Breath    [name] is a 87 y.o. male.   Chest Pain Associated symptoms: shortness of breath   Shortness of Breath Associated symptoms: chest pain    This patient is an 87 year old male who has a history of myelodysplastic syndrome for which he has been treated for the last 5 years.  He receives chemotherapy for his cancer 5 days a week using the left upper chest Port-A-Cath.  He presents today stating that up for approximately 48 hours he has had some intermittent feeling of not being able to breathe or swallow specifically this occurs when he tries to lay down, it goes away when he tries to sit up.  He denies fevers chills nausea vomiting or diarrhea, he has not been coughing but does carry a diagnosis of pulmonary fibrosis and uses oxygen as needed.  He reports he is able to swallow liquids but has an exceedingly difficult time swallowing solids though he is able to do so.  He reports no swelling of his legs, he has not been able to lay down flat because of his severe dyspnea and the discomfort on his chest when this occurs.  Past Medical History:  Diagnosis Date   Bone cancer ([place])    Chronic kidney insufficiency    COPD (chronic obstructive pulmonary disease) (HCC)    GERD (gastroesophageal reflux disease)    Pulmonary nodule     Patient Active Problem List   Diagnosis Date Noted   Neoplastic malignant related fatigue 01/21/2021   Depression 12/02/2020   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   Hematuria 12/02/2020   Osteoporosis 12/02/2020   Low back pain 12/02/2020   Syncope 11/11/2020   COVID-19 virus infection 11/11/2020   COPD (chronic obstructive pulmonary disease) ([place]) 04/03/2020   Symptomatic anemia 04/03/2020   COPD (chronic obstructive pulmonary disease) with  emphysema/bronchiectasis and Usual interstitial pneumonitis 04/03/2020   Chronic respiratory failure with hypoxia /// 3L/min at baseline 04/03/2020   Urinary frequency 02/26/2020   Benign prostatic hyperplasia with urinary obstruction 02/26/2020   Myelodysplastic syndrome ([place]) 06/16/2019   HAP (hospital-acquired pneumonia) 06/16/2019   CKD (chronic kidney disease), stage IIIB 06/16/2019   Sepsis ([place]) 06/16/2019   Thrombocytopenia ([place]) 06/06/2019   Intractable back pain 06/06/2019   Chronic kidney insufficiency    Body mass index (BMI) 19.9 or less, adult 04/20/2019   Compression fracture of lumbar vertebra ([place]) 04/20/2019   Elevated blood-pressure reading, without diagnosis of hypertension 04/20/2019   Acute low back pain 03/23/2019   Pulmonary nodule 05/19/2018   Bronchiectasis ([place]) 05/19/2018   Anemia 09/17/2017   Abnormal CT of the abdomen 11/02/2016   Loss of weight 11/02/2016   History of colonic polyps 11/02/2016   GERD (gastroesophageal reflux disease) 10/01/2015    Past Surgical History:  Procedure Laterality Date   BACK SURGERY     CATARACT EXTRACTION W/PHACO Right 03/09/2016   Procedure: CATARACT EXTRACTION PHACO AND INTRAOCULAR LENS PLACEMENT RIGHT EYE CDE=8.58;  Surgeon: Tonny Branch, MD;  Location: [facility];  Service: Ophthalmology;  Laterality: Right;  right   CATARACT EXTRACTION W/PHACO Left 04/13/2016   Procedure: CATARACT EXTRACTION PHACO AND INTRAOCULAR LENS PLACEMENT (IOC);  Surgeon: Tonny Branch, MD;  Location: [facility];  Service: Ophthalmology;  Laterality: Left;  CDE: 8.08   KYPHOPLASTY Bilateral 06/09/2019  Procedure: T11 KYPHOPLASTY;  Surgeon: Consuella Lose, MD;  Location: [place];  Service: Neurosurgery;  Laterality: Bilateral;   None to Date  10/01/15   PORTACATH PLACEMENT Left 10/11/2020   Procedure: INSERTION PORT-A-CATH (attached catheter in left internal jugular);  Surgeon: Virl Cagey, MD;  Location: [facility];  Service: General;  Laterality: Left;        Family History  Problem Relation Age of Onset   Kidney disease Mother    Kidney disease Sister    Colon cancer Neg Hx    Gastric cancer Neg Hx    Esophageal cancer Neg Hx     Social History   Tobacco Use   Smoking status: Former    Years: 15.00    Types: Cigarettes, Cigars    Quit date: 10/01/1990    Years since quitting: 30.3   Smokeless tobacco: Never   Tobacco comments:    Quit x 25-30 years; 2 cigars daily when smoked  Vaping Use   Vaping Use: Never used  Substance Use Topics   Alcohol use: Not Currently    Alcohol/week: 0.0 standard drinks   Drug use: No    Home Medications Prior to Admission medications   Medication Sig Start Date End Date Taking? Authorizing Provider  acetaminophen (TYLENOL) 325 MG tablet Take 2 tablets (650 mg total) by mouth every 6 (six) hours as needed for mild pain, fever or headache (or Fever >/= 101). 04/04/20  Yes Emokpae, Courage, MD  albuterol (VENTOLIN HFA) 108 (90 Base) MCG/ACT inhaler Inhale 2 puffs into the lungs every 4 (four) hours as needed for wheezing or shortness of breath. 04/04/20  Yes Emokpae, Courage, MD  megestrol (MEGACE) 400 MG/10ML suspension Take 10 mLs (400 mg total) by mouth 2 (two) times daily. 02/10/21  Yes Derek Jack, MD  ondansetron (ZOFRAN) 4 MG tablet Take 1 tablet (4 mg total) by mouth every 6 (six) hours as needed for nausea or vomiting. 7/74/12  Yes Delora Fuel, MD  pantoprazole (PROTONIX) 40 MG tablet Take 40 mg by mouth daily. Pt no longer taking 06/21/19  Yes [provider]  predniSONE (DELTASONE) 10 MG tablet Take 10 mg by mouth daily with breakfast.   Yes [provider]  prochlorperazine (COMPAZINE) 10 MG tablet Take 1 tablet (10 mg total) by mouth every 6 (six) hours as needed for nausea or vomiting. 09/13/20  Yes Derek Jack, MD  tiotropium (SPIRIVA HANDIHALER) 18 MCG inhalation capsule Place 1 capsule (18 mcg total) into inhaler and inhale daily. 04/04/20  Yes  Emokpae, Courage, MD  tamsulosin (FLOMAX) 0.4 MG CAPS capsule Take 1 capsule (0.4 mg total) by mouth daily after supper. Patient not taking: Reported on 02/15/2021 04/04/20   Roxan Hockey, MD    Allergies    Patient has no known allergies.  Review of Systems   Review of Systems  Respiratory:  Positive for shortness of breath.   Cardiovascular:  Positive for chest pain.  All other systems reviewed and are negative.  Physical Exam Updated Vital Signs BP 102/62   Pulse 72   Temp 98.3 F (36.8 C) (Oral)   Resp (!) 21   Ht 1.93 m (6\' 4" )   Wt 55.3 kg   SpO2 95%   BMI 14.85 kg/m   Physical Exam Vitals and nursing note reviewed.  Constitutional:      General: He is not in acute distress.    Appearance: He is well-developed. He is ill-appearing (chronically).     Comments: Cachectic  HENT:  Head: Normocephalic and atraumatic.     Mouth/Throat:     Pharynx: No oropharyngeal exudate.  Eyes:     General: No scleral icterus.       Right eye: No discharge.        Left eye: No discharge.     Conjunctiva/sclera: Conjunctivae normal.     Pupils: Pupils are equal, round, and reactive to light.  Neck:     Thyroid: No thyromegaly.     Vascular: No JVD.  Cardiovascular:     Rate and Rhythm: Normal rate and regular rhythm.     Heart sounds: Normal heart sounds. No murmur heard.   No friction rub. No gallop.  Pulmonary:     Effort: Pulmonary effort is normal. No respiratory distress.     Breath sounds: Rales present. No wheezing.     Comments: No increased work of breathing, speaks in full sentences, rales at the bases bilaterally Abdominal:     General: Bowel sounds are normal. There is no distension.     Palpations: Abdomen is soft. There is no mass.     Tenderness: There is no abdominal tenderness.  Musculoskeletal:        General: No tenderness. Normal range of motion.     Cervical back: Normal range of motion and neck supple.     Right lower leg: No edema.     Left  lower leg: No edema.  Lymphadenopathy:     Cervical: No cervical adenopathy.  Skin:    General: Skin is warm and dry.     Findings: No erythema or rash.  Neurological:     General: No focal deficit present.     Mental Status: He is alert.     Coordination: Coordination normal.  Psychiatric:        Behavior: Behavior normal.    ED Results / Procedures / Treatments   Labs (all labs ordered are listed, but only abnormal results are displayed) Labs Reviewed  BASIC METABOLIC PANEL - Abnormal; Notable for the following components:      Result Value   BUN 40 (*)    Creatinine, Ser 1.79 (*)    Calcium 8.8 (*)    GFR, Estimated 36 (*)    All other components within normal limits  CBC - Abnormal; Notable for the following components:   RBC 2.94 (*)    Hemoglobin 7.5 (*)    HCT 23.7 (*)    MCH 25.5 (*)    RDW 18.2 (*)    Platelets 78 (*)    nRBC 0.7 (*)    All other components within normal limits  BRAIN NATRIURETIC PEPTIDE - Abnormal; Notable for the following components:   B Natriuretic Peptide 231.0 (*)    All other components within normal limits  HEPATIC FUNCTION PANEL - Abnormal; Notable for the following components:   Albumin 3.3 (*)    All other components within normal limits  TROPONIN I (HIGH SENSITIVITY)  TROPONIN I (HIGH SENSITIVITY)    EKG None  Radiology CT CHEST WO CONTRAST  Result Date: 02/15/2021 CLINICAL DATA:  Chest pain and shortness of breath. EXAM: CT CHEST WITHOUT CONTRAST TECHNIQUE: Multidetector CT imaging of the chest was performed following the standard protocol without IV contrast. COMPARISON:  None. FINDINGS: Cardiovascular: A left-sided venous Port-A-Cath is in place. There is mild to moderate severity calcification of the aortic arch. No significant vascular findings. Normal heart size with marked severity coronary artery calcification. No pericardial effusion. Mediastinum/Nodes: No enlarged mediastinal or  axillary lymph nodes. The thyroid gland  and trachea demonstrate no significant findings. Oral contrast is seen throughout the esophagus which is distended and stable in appearance Lungs/Pleura: There is marked severity emphysematous lung disease with mild to moderate severity areas of biapical scarring and/or atelectasis. Moderate severity areas of scarring and/or atelectasis are also seen within the bilateral lung bases and posterior aspects of the bilateral upper lobes, right greater than left. There is no evidence of a pleural effusion or pneumothorax. Upper Abdomen: No acute abnormality. Musculoskeletal: Degenerative changes seen throughout the thoracic spine. Compression fracture deformities are seen at the levels of T7, T8, T9 and T10 with evidence of prior vertebroplasty seen at the level of T12. Other: It should be noted that the study is limited secondary to patient motion. IMPRESSION: 1. Marked severity emphysematous lung disease with mild to moderate severity areas of biapical scarring and/or atelectasis. 2. Moderate severity areas of bilateral upper lobe and bibasilar scarring and/or atelectasis. 3. Compression fracture deformities of the thoracic spine with evidence of prior vertebroplasty at the level of T12. Aortic Atherosclerosis (ICD10-I70.0) and Emphysema (ICD10-J43.9). Electronically Signed   By: Virgina Norfolk M.D.   On: 02/15/2021 21:32   DG Chest Port 1 View  Result Date: 02/15/2021 CLINICAL DATA:  Shortness of breath. EXAM: PORTABLE CHEST 1 VIEW COMPARISON:  CT angiogram chest and chest x-ray 11/11/2020. FINDINGS: Left chest port catheter tip projects over the distal SVC, unchanged. There are scattered interstitial/reticular opacities throughout both lungs similar to the prior exam. There is no new focal lung consolidation, pleural effusion or pneumothorax. The cardiomediastinal silhouette is within normal limits. Vertebral plasty changes are again seen in the lower thoracic and upper lumbar spine. No acute fractures are  seen. IMPRESSION: Stable chronic interstitial opacities throughout both lungs. No acute lung infiltrate. Electronically Signed   By: Ronney Asters M.D.   On: 02/15/2021 20:14    Procedures Procedures   Medications Ordered in ED Medications - No data to display  ED Course  I have reviewed the triage vital signs and the nursing notes.  Pertinent labs & imaging results that were available during my care of the patient were reviewed by me and considered in my medical decision making (see chart for details).    MDM Rules/Calculators/A&P                           The patient actually does not appear ill, he appears chronically cachectic and has had some significant difficulty with symptoms in his upper chest and feeling like he cannot swallow and getting very dyspneic when he tries to lay down.  In fact when I try to lay the patient down and last for about 5 seconds before he has to sit up because of the intense discomfort.  His vital signs are unremarkable, will check EKG and a CT scan of the chest to make sure he does not have a compressive mass.  He denies that he swallowed anything that got stuck.  The patient has a negative CT scan of the chest that did not show anything that would be causative of the patient's symptoms.  His labs thus far been unremarkable,  I did discuss the case with Dr. Delton Coombes of the oncology service who thinks that the patient does need an echocardiogram and request that he be admitted to the medical service for observation and an echocardiogram.  The patient is agreeable to this plan  Discussed with hospitalist,  she will admit to the hospital.  Final Clinical Impression(s) / ED Diagnoses Final diagnoses:  Dyspnea, unspecified type     Noemi Chapel, MD 02/15/21 [id number]

## 2021-02-15 NOTE — ED Triage Notes (Signed)
Pt states he "thinks he has a blockage or something that won't go down". States it started around 4pm yesterday while at the cancer center. Pt able to drink liquids and keep them down. Pt c/o chest pain and sob.

## 2021-02-16 ENCOUNTER — Observation Stay (HOSPITAL_BASED_OUTPATIENT_CLINIC_OR_DEPARTMENT_OTHER): Payer: Medicare Other

## 2021-02-16 ENCOUNTER — Encounter (HOSPITAL_COMMUNITY): Payer: Self-pay | Admitting: Hematology

## 2021-02-16 DIAGNOSIS — D649 Anemia, unspecified: Secondary | ICD-10-CM

## 2021-02-16 DIAGNOSIS — J449 Chronic obstructive pulmonary disease, unspecified: Secondary | ICD-10-CM | POA: Diagnosis not present

## 2021-02-16 DIAGNOSIS — R0789 Other chest pain: Secondary | ICD-10-CM | POA: Diagnosis not present

## 2021-02-16 DIAGNOSIS — R079 Chest pain, unspecified: Secondary | ICD-10-CM

## 2021-02-16 DIAGNOSIS — R1310 Dysphagia, unspecified: Secondary | ICD-10-CM

## 2021-02-16 DIAGNOSIS — Z0189 Encounter for other specified special examinations: Secondary | ICD-10-CM

## 2021-02-16 LAB — COMPREHENSIVE METABOLIC PANEL
ALT: 14 U/L (ref 0–44)
AST: 15 U/L (ref 15–41)
Albumin: 3 g/dL — ABNORMAL LOW (ref 3.5–5.0)
Alkaline Phosphatase: 55 U/L (ref 38–126)
Anion gap: 3 — ABNORMAL LOW (ref 5–15)
BUN: 38 mg/dL — ABNORMAL HIGH (ref 8–23)
CO2: 24 mmol/L (ref 22–32)
Calcium: 8.4 mg/dL — ABNORMAL LOW (ref 8.9–10.3)
Chloride: 108 mmol/L (ref 98–111)
Creatinine, Ser: 1.66 mg/dL — ABNORMAL HIGH (ref 0.61–1.24)
GFR, Estimated: 39 mL/min — ABNORMAL LOW (ref >60)
Glucose, Bld: 84 mg/dL (ref 70–99)
Potassium: 4.2 mmol/L (ref 3.5–5.1)
Sodium: 135 mmol/L (ref 135–145)
Total Bilirubin: 0.8 mg/dL (ref 0.3–1.2)
Total Protein: 6.2 g/dL — ABNORMAL LOW (ref 6.5–8.1)

## 2021-02-16 LAB — CBC
HCT: 22.2 % — ABNORMAL LOW (ref 39.0–52.0)
HCT: 25.9 % — ABNORMAL LOW (ref 39.0–52.0)
Hemoglobin: 7 g/dL — ABNORMAL LOW (ref 13.0–17.0)
Hemoglobin: 8.1 g/dL — ABNORMAL LOW (ref 13.0–17.0)
MCH: 25.9 pg — ABNORMAL LOW (ref 26.0–34.0)
MCH: 25.8 pg — ABNORMAL LOW (ref 26.0–34.0)
MCHC: 31.5 g/dL (ref 30.0–36.0)
MCHC: 31.3 g/dL (ref 30.0–36.0)
MCV: 82.2 fL (ref 80.0–100.0)
MCV: 82.5 fL (ref 80.0–100.0)
Platelets: 73 10*3/uL — ABNORMAL LOW (ref 150–400)
Platelets: 72 10*3/uL — ABNORMAL LOW (ref 150–400)
RBC: 2.7 MIL/uL — ABNORMAL LOW (ref 4.22–5.81)
RBC: 3.14 MIL/uL — ABNORMAL LOW (ref 4.22–5.81)
RDW: 17.1 % — ABNORMAL HIGH (ref 11.5–15.5)
RDW: 16.2 % — ABNORMAL HIGH (ref 11.5–15.5)
WBC: 5.4 10*3/uL (ref 4.0–10.5)
WBC: 5.9 10*3/uL (ref 4.0–10.5)
nRBC: 0.6 % — ABNORMAL HIGH (ref 0.0–0.2)
nRBC: 0.3 % — ABNORMAL HIGH (ref 0.0–0.2)

## 2021-02-16 LAB — ECHOCARDIOGRAM COMPLETE
AR max vel: 3.15 cm2
AV Area VTI: 3.17 cm2
AV Area mean vel: 3.17 cm2
AV Mean grad: 3 mmHg
AV Peak grad: 6.1 mmHg
Ao pk vel: 1.23 m/s
Area-P 1/2: 1.74 cm2
Height: 76 in
S' Lateral: 2.3 cm
Weight: 1952 oz

## 2021-02-16 LAB — PREPARE RBC (CROSSMATCH)

## 2021-02-16 LAB — POC OCCULT BLOOD, ED: Fecal Occult Bld: NEGATIVE

## 2021-02-16 MED ORDER — HEPARIN SODIUM (PORCINE) 5000 UNIT/ML IJ SOLN: 5000.0000 [IU] | Freq: Three times a day (TID) | INTRAMUSCULAR | Status: DC

## 2021-02-16 MED ORDER — ALBUTEROL SULFATE (2.5 MG/3ML) 0.083% IN NEBU: 2.5000 mg | INHALATION_SOLUTION | RESPIRATORY_TRACT | Status: DC | PRN

## 2021-02-16 MED ORDER — ALBUTEROL SULFATE HFA 108 (90 BASE) MCG/ACT IN AERS: 2.0000 | INHALATION_SPRAY | RESPIRATORY_TRACT | Status: DC | PRN

## 2021-02-16 MED ORDER — UMECLIDINIUM BROMIDE 62.5 MCG/INH IN AEPB
1.0000 | INHALATION_SPRAY | Freq: Every day | RESPIRATORY_TRACT | Status: DC
  Administered 2021-02-16: 1 via RESPIRATORY_TRACT
  Filled 2021-02-16: qty 7

## 2021-02-16 MED ORDER — ONDANSETRON HCL 4 MG/2ML IJ SOLN: 4.0000 mg | Freq: Four times a day (QID) | INTRAMUSCULAR | Status: DC | PRN

## 2021-02-16 MED ORDER — TIOTROPIUM BROMIDE MONOHYDRATE 18 MCG IN CAPS: 1.0000 | ORAL_CAPSULE | Freq: Every day | RESPIRATORY_TRACT | Status: DC

## 2021-02-16 MED ORDER — MORPHINE SULFATE (PF) 2 MG/ML IV SOLN
2.0000 mg | INTRAVENOUS | Status: DC | PRN
  Administered 2021-02-16: 2 mg via INTRAVENOUS
  Filled 2021-02-16: qty 1

## 2021-02-16 MED ORDER — ACETAMINOPHEN 325 MG PO TABS: 650.0000 mg | ORAL_TABLET | ORAL | Status: DC | PRN

## 2021-02-16 MED ORDER — SODIUM CHLORIDE 0.9% IV SOLUTION: Freq: Once | INTRAVENOUS | Status: AC

## 2021-02-16 MED ORDER — HEPARIN SOD (PORK) LOCK FLUSH 100 UNIT/ML IV SOLN
500.0000 [IU] | Freq: Once | INTRAVENOUS | Status: AC
  Administered 2021-02-16: 500 [IU]

## 2021-02-16 NOTE — Discharge Instructions (Signed)
Diet-Diet - low sodium heart healthyNursing-Call MD for:  difficulty breathing, headache or visual disturbances; Call MD for:  extreme fatigue; Call MD for:  persistant dizziness or light-headedness; Call MD for:  persistant nausea and vomiting; Call MD for:  severe uncontrolled pain; Call MD for:  temperature >100.4; Increase activity slowly

## 2021-02-16 NOTE — Progress Notes (Signed)
*  PRELIMINARY RESULTS* Echocardiogram 2D Echocardiogram has been performed.  Samuel Germany 02/16/2021, 10:00 AM

## 2021-02-16 NOTE — ED Notes (Signed)
Echo at bedside

## 2021-02-16 NOTE — ED Notes (Signed)
Daughter updated on patient's discharge and follow-up appointment tomorrow. Patient verbalized that he feels well enough to drive himself home and verbalized understanding of discharge instructions.

## 2021-02-16 NOTE — H&P (Signed)
TRH H&P    Patient Demographics:    [name], is a 87 y.o. male  MRN: [id number]  DOB - 1929/08/11  Admit Date - 02/15/2021  Referring MD/NP/PA: Sabra Heck  Outpatient Primary MD for the patient is Sharilyn Sites, MD  Patient coming from: Home  Chief complaint- Chest pressure/ dyspnea   HPI:    [name]  is a 87 y.o. male, with history of bone cancer, chronic kidney insufficiency, COPD, GERD, pulmonary nodule, and more presents the ED with a chief complaint of difficulty breathing and dysphagia.  Patient reports that this started 3-4 days ago.  Its been gradually worse since it started.  He saw his oncologist about it who ordered Zofran, but that has not helped.  He reports that laying flat makes the symptoms worse.  He describes a chest pressure as being in the middle but high in his chest almost to his neck.  He denies any palpitations.  He reports that the chest pressure and dyspnea are nonexertional.  This all started when he was trying to swallow a piece of chicken at dinner.  His last normal meal was several days ago because of the symptoms.  He does report that this dysphagia is specific to use solid foods and not liquids.  Patient reports that he feels like he cannot belch.  He has not been coughing.  He does not have any fevers.  He has no further complaints.  Patient does have bruising on his arm when questioned about this he reports that he has to get up frequently in the night and he hit his arm on door frames.    Patient does not smoke, does not drink, does not use illicit drugs.  He is vaccinated for COVID.  Patient is full code.  In the ED Temp 98.3, heart rate 72-74, respiratory rate 16-23, blood pressure 102/62, satting 95% CT chest shows emphysematous lung disease with mild to moderately severe areas of biapical scarring and or atelectasis.  Moderate severity areas of bilateral upper lobe  and bibasilar scarring and or atelectasis.  Compression fraction deformities of the thoracic spine with evidence of prior vertebroplasty. Chest x-ray shows chronic interstitial opacities throughout both lungs EKG showed a heart rate of 76, sinus rhythm, QTC 414 No leukocytosis, hemoglobin 7.5, recently 8.7 Chemistry panel shows an improvement from the last creatinine of 2.32-today at 1.79 BNP 231, Troponin flat at 13 and 14 ED provider spoke with Dr. Raliegh Ip who reports that the kind of chemo that this patient is on does not have effects on his heart.  He does recommend observing overnight with echo in the morning.  Patient does get chemo 5 days a week.  He has known myelodysplasia as well.    Review of systems:    In addition to the HPI above,  No Fever-chills, No Headache, No changes with Vision or hearing, Admits to dysphagia with food but not liquids, Admits to chest pressure cough or Shortness of Breath, No Abdominal pain, No Nausea or Vomiting, bowel movements are regular, No Blood in  stool or Urine, No dysuria, No new skin rashes or bruises, No new joints pains-aches,  No new weakness, tingling, numbness in any extremity, No recent weight gain or loss, No polyuria, polydypsia or polyphagia, No significant Mental Stressors.  All other systems reviewed and are negative.    Past History of the following :    Past Medical History:  Diagnosis Date   Bone cancer ([place])    Chronic kidney insufficiency    COPD (chronic obstructive pulmonary disease) (HCC)    GERD (gastroesophageal reflux disease)    Pulmonary nodule       Past Surgical History:  Procedure Laterality Date   BACK SURGERY     CATARACT EXTRACTION W/PHACO Right 03/09/2016   Procedure: CATARACT EXTRACTION PHACO AND INTRAOCULAR LENS PLACEMENT RIGHT EYE CDE=8.58;  Surgeon: Tonny Branch, MD;  Location: [facility];  Service: Ophthalmology;  Laterality: Right;  right   CATARACT EXTRACTION W/PHACO Left 04/13/2016   Procedure:  CATARACT EXTRACTION PHACO AND INTRAOCULAR LENS PLACEMENT (IOC);  Surgeon: Tonny Branch, MD;  Location: [facility];  Service: Ophthalmology;  Laterality: Left;  CDE: 8.08   KYPHOPLASTY Bilateral 06/09/2019   Procedure: T11 KYPHOPLASTY;  Surgeon: Consuella Lose, MD;  Location: [facility];  Service: Neurosurgery;  Laterality: Bilateral;   None to Date  10/01/15   PORTACATH PLACEMENT Left 10/11/2020   Procedure: INSERTION PORT-A-CATH (attached catheter in left internal jugular);  Surgeon: Virl Cagey, MD;  Location: [facility];  Service: General;  Laterality: Left;      Social History:      Social History   Tobacco Use   Smoking status: Former    Years: 15.00    Types: Cigarettes, Cigars    Quit date: 10/01/1990    Years since quitting: 30.4   Smokeless tobacco: Never   Tobacco comments:    Quit x 25-30 years; 2 cigars daily when smoked  Substance Use Topics   Alcohol use: Not Currently    Alcohol/week: 0.0 standard drinks       Family History :     Family History  Problem Relation Age of Onset   Kidney disease Mother    Kidney disease Sister    Colon cancer Neg Hx    Gastric cancer Neg Hx    Esophageal cancer Neg Hx       Home Medications:   Prior to Admission medications   Medication Sig Start Date End Date Taking? Authorizing Provider  acetaminophen (TYLENOL) 325 MG tablet Take 2 tablets (650 mg total) by mouth every 6 (six) hours as needed for mild pain, fever or headache (or Fever >/= 101). 04/04/20  Yes Emokpae, Courage, MD  albuterol (VENTOLIN HFA) 108 (90 Base) MCG/ACT inhaler Inhale 2 puffs into the lungs every 4 (four) hours as needed for wheezing or shortness of breath. 04/04/20  Yes Emokpae, Courage, MD  megestrol (MEGACE) 400 MG/10ML suspension Take 10 mLs (400 mg total) by mouth 2 (two) times daily. 02/10/21  Yes Derek Jack, MD  ondansetron (ZOFRAN) 4 MG tablet Take 1 tablet (4 mg total) by mouth every 6 (six) hours as needed for nausea or vomiting.  10/21/28  Yes Delora Fuel, MD  pantoprazole (PROTONIX) 40 MG tablet Take 40 mg by mouth daily. Pt no longer taking 06/21/19  Yes [provider]  predniSONE (DELTASONE) 10 MG tablet Take 10 mg by mouth daily with breakfast.   Yes [provider]  prochlorperazine (COMPAZINE) 10 MG tablet Take 1 tablet (10 mg total) by mouth every 6 (  six) hours as needed for nausea or vomiting. 09/13/20  Yes Derek Jack, MD  tiotropium (SPIRIVA HANDIHALER) 18 MCG inhalation capsule Place 1 capsule (18 mcg total) into inhaler and inhale daily. 04/04/20  Yes Emokpae, Courage, MD  tamsulosin (FLOMAX) 0.4 MG CAPS capsule Take 1 capsule (0.4 mg total) by mouth daily after supper. Patient not taking: Reported on 02/15/2021 04/04/20   Roxan Hockey, MD     Allergies:    No Known Allergies   Physical Exam:   Vitals  Blood pressure 123/68, pulse 82, temperature 98.3 F (36.8 C), temperature source Oral, resp. rate 16, height 6\' 4"  (1.93 m), weight 55.3 kg, SpO2 91 %.  1.  General: Patient lying supine in bed,  no acute distress   2. Psychiatric: Alert and oriented x 3, mood and behavior normal for situation, pleasant and cooperative with exam   3. Neurologic: Speech and language are normal, face is symmetric, moves all 4 extremities voluntarily, at baseline without acute deficits on limited exam   4. HEENMT:  Head is atraumatic, normocephalic, pupils reactive to light, neck is supple, trachea is midline, mucous membranes are moist   5. Respiratory : Lungs are clear to auscultation bilaterally without wheezing, rhonchi, rales, no cyanosis, no increase in work of breathing or accessory muscle use   6. Cardiovascular : Heart rate normal, rhythm is regular, no murmurs, rubs or gallops, no peripheral edema, peripheral pulses palpated   7. Gastrointestinal:  Abdomen is soft, nondistended, nontender to palpation bowel sounds active, no masses or organomegaly palpated   8. Skin:   Skin is warm, dry and intact without rashes, acute lesions, or ulcers on limited exam   9.Musculoskeletal:  No acute deformities or trauma, no asymmetry in tone, no peripheral edema, peripheral pulses palpated, no tenderness to palpation in the extremities     Data Review:    CBC Recent Labs  Lab 02/10/21 0842 02/15/21 1946 02/16/21 0400  WBC 4.3 5.9 5.4  HGB 8.7* 7.5* 7.0*  HCT 28.5* 23.7* 22.2*  PLT 61* 78* 73*  MCV 81.2 80.6 82.2  MCH 24.8* 25.5* 25.9*  MCHC 30.5 31.6 31.5  RDW 16.6* 18.2* 17.1*  LYMPHSABS 0.8  --   --   MONOABS 0.2  --   --   EOSABS 0.0  --   --   BASOSABS 0.0  --   --    ------------------------------------------------------------------------------------------------------------------  Results for orders placed or performed during the hospital encounter of 02/15/21 (from the past 48 hour(s))  Basic metabolic panel     Status: Abnormal   Collection Time: 02/15/21  7:46 PM  Result Value Ref Range   Sodium 136 135 - 145 mmol/L   Potassium 3.8 3.5 - 5.1 mmol/L   Chloride 108 98 - 111 mmol/L   CO2 23 22 - 32 mmol/L   Glucose, Bld 82 70 - 99 mg/dL    Comment: Glucose reference range applies only to samples taken after fasting for at least 8 hours.   BUN 40 (H) 8 - 23 mg/dL   Creatinine, Ser 1.79 (H) 0.61 - 1.24 mg/dL   Calcium 8.8 (L) 8.9 - 10.3 mg/dL   GFR, Estimated 36 (L) >60 mL/min    Comment: (NOTE) Calculated using the CKD-EPI Creatinine Equation (2021)    Anion gap 5 5 - 15    Comment: Performed at [facility], [address], [place], [place] [address]  CBC     Status: Abnormal   Collection Time: 02/15/21  7:46 PM  Result Value Ref Range   WBC 5.9 4.0 - 10.5 K/uL   RBC 2.94 (L) 4.22 - 5.81 MIL/uL   Hemoglobin 7.5 (L) 13.0 - 17.0 g/dL   HCT 23.7 (L) 39.0 - 52.0 %   MCV 80.6 80.0 - 100.0 fL   MCH 25.5 (L) 26.0 - 34.0 pg   MCHC 31.6 30.0 - 36.0 g/dL   RDW 18.2 (H) 11.5 - 15.5 %   Platelets 78 (L) 150 - 400 K/uL    Comment: SPECIMEN  CHECKED FOR CLOTS CONSISTENT WITH PREVIOUS RESULT    nRBC 0.7 (H) 0.0 - 0.2 %    Comment: Performed at [facility], [address] [place], [place] [address]  Troponin I (High Sensitivity)     Status: None   Collection Time: 02/15/21  7:46 PM  Result Value Ref Range   Troponin I (High Sensitivity) 13 <18 ng/L    Comment: (NOTE) Elevated high sensitivity troponin I (hsTnI) values and significant  changes across serial measurements may suggest ACS but many other  chronic and acute conditions are known to elevate hsTnI results.  Refer to the "Links" section for chest pain algorithms and additional  guidance. Performed at [facility], [address] [place], [place] [address]   Brain natriuretic peptide     Status: Abnormal   Collection Time: 02/15/21  7:46 PM  Result Value Ref Range   B Natriuretic Peptide 231.0 (H) 0.0 - 100.0 pg/mL    Comment: Performed at [facility], [address] [place], [place] [address]  Hepatic function panel     Status: Abnormal   Collection Time: 02/15/21  7:46 PM  Result Value Ref Range   Total Protein 6.5 6.5 - 8.1 g/dL   Albumin 3.3 (L) 3.5 - 5.0 g/dL   AST 16 15 - 41 U/L   ALT 13 0 - 44 U/L   Alkaline Phosphatase 62 38 - 126 U/L   Total Bilirubin 0.7 0.3 - 1.2 mg/dL   Bilirubin, Direct 0.1 0.0 - 0.2 mg/dL   Indirect Bilirubin 0.6 0.3 - 0.9 mg/dL    Comment: Performed at [facility], [address] [place], [place] [address]  Troponin I (High Sensitivity)     Status: None   Collection Time: 02/15/21 10:44 PM  Result Value Ref Range   Troponin I (High Sensitivity) 14 <18 ng/L    Comment: (NOTE) Elevated high sensitivity troponin I (hsTnI) values and significant  changes across serial measurements may suggest ACS but many other  chronic and acute conditions are known to elevate hsTnI results.  Refer to the "Links" section for chest pain algorithms and additional  guidance. Performed at [facility], [address], [place], [place] [address]    CBC     Status: Abnormal   Collection Time: 02/16/21  4:00 AM  Result Value Ref Range   WBC 5.4 4.0 - 10.5 K/uL   RBC 2.70 (L) 4.22 - 5.81 MIL/uL   Hemoglobin 7.0 (L) 13.0 - 17.0 g/dL   HCT 22.2 (L) 39.0 - 52.0 %   MCV 82.2 80.0 - 100.0 fL   MCH 25.9 (L) 26.0 - 34.0 pg   MCHC 31.5 30.0 - 36.0 g/dL   RDW 17.1 (H) 11.5 - 15.5 %   Platelets 73 (L) 150 - 400 K/uL    Comment: SPECIMEN CHECKED FOR CLOTS CONSISTENT WITH PREVIOUS RESULT    nRBC 0.6 (H) 0.0 - 0.2 %    Comment: Performed at [facility], [address],  [place], [place] [address]  Comprehensive metabolic panel     Status: Abnormal   Collection Time: 02/16/21  4:00 AM  Result Value Ref Range   Sodium 135 135 - 145 mmol/L   Potassium 4.2 3.5 - 5.1 mmol/L   Chloride 108 98 - 111 mmol/L   CO2 24 22 - 32 mmol/L   Glucose, Bld 84 70 - 99 mg/dL    Comment: Glucose reference range applies only to samples taken after fasting for at least 8 hours.   BUN 38 (H) 8 - 23 mg/dL   Creatinine, Ser 1.66 (H) 0.61 - 1.24 mg/dL   Calcium 8.4 (L) 8.9 - 10.3 mg/dL   Total Protein 6.2 (L) 6.5 - 8.1 g/dL   Albumin 3.0 (L) 3.5 - 5.0 g/dL   AST 15 15 - 41 U/L   ALT 14 0 - 44 U/L   Alkaline Phosphatase 55 38 - 126 U/L   Total Bilirubin 0.8 0.3 - 1.2 mg/dL   GFR, Estimated 39 (L) >60 mL/min    Comment: (NOTE) Calculated using the CKD-EPI Creatinine Equation (2021)    Anion gap 3 (L) 5 - 15    Comment: Performed at [facility], [address], [place],  [address]    Chemistries  Recent Labs  Lab 02/10/21 [phone] 02/15/21 1946 02/16/21 0400  NA 136 136 135  K 3.8 3.8 4.2  CL 105 108 108  CO2 22 23 24   GLUCOSE 92 82 84  BUN 38* 40* 38*  CREATININE 2.32* 1.79* 1.66*  CALCIUM 10.3 8.8* 8.4*  AST 18 16 15   ALT 13 13 14   ALKPHOS 75 62 55  BILITOT 1.2 0.7 0.8    ------------------------------------------------------------------------------------------------------------------  ------------------------------------------------------------------------------------------------------------------ GFR: Estimated Creatinine Clearance: 23.6 mL/min (A) (by C-G formula based on SCr of 1.66 mg/dL (H)). Liver Function Tests: Recent Labs  Lab 02/10/21 0842 02/15/21 1946 02/16/21 0400  AST 18 16 15   ALT 13 13 14   ALKPHOS 75 62 55  BILITOT 1.2 0.7 0.8  PROT 7.0 6.5 6.2*  ALBUMIN 3.3* 3.3* 3.0*   No results for input(s): LIPASE, AMYLASE in the last 168 hours. No results for input(s): AMMONIA in the last 168 hours. Coagulation Profile: No results for input(s): INR, PROTIME in the last 168 hours. Cardiac Enzymes: No results for input(s): CKTOTAL, CKMB, CKMBINDEX, TROPONINI in the last 168 hours. BNP (last 3 results) No results for input(s): PROBNP in the last 8760 hours. HbA1C: No results for input(s): HGBA1C in the last 72 hours. CBG: No results for input(s): GLUCAP in the last 168 hours. Lipid Profile: No results for input(s): CHOL, HDL, LDLCALC, TRIG, CHOLHDL, LDLDIRECT in the last 72 hours. Thyroid Function Tests: No results for input(s): TSH, T4TOTAL, FREET4, T3FREE, THYROIDAB in the last 72 hours. Anemia Panel: No results for input(s): VITAMINB12, FOLATE, FERRITIN, TIBC, IRON, RETICCTPCT in the last 72 hours.  --------------------------------------------------------------------------------------------------------------- Urine analysis:    Component Value Date/Time   COLORURINE YELLOW 06/26/2020 1823   APPEARANCEUR HAZY (A) 06/26/2020 1823   APPEARANCEUR Hazy (A) 02/26/2020 0910   LABSPEC 1.026 06/26/2020 1823   PHURINE 5.0 06/26/2020 1823   GLUCOSEU NEGATIVE 06/26/2020 1823   HGBUR SMALL (A) 06/26/2020 1823   BILIRUBINUR NEGATIVE 06/26/2020 1823   BILIRUBINUR Negative 02/26/2020 0910   KETONESUR NEGATIVE 06/26/2020 1823   PROTEINUR  NEGATIVE 06/26/2020 1823   UROBILINOGEN 0.2 08/15/2013 1240   NITRITE NEGATIVE 06/26/2020 1823   LEUKOCYTESUR MODERATE (A) 06/26/2020 1823      Imaging Results:  CT CHEST WO CONTRAST  Result Date: 02/15/2021 CLINICAL DATA:  Chest pain and shortness of breath. EXAM: CT CHEST WITHOUT CONTRAST TECHNIQUE: Multidetector CT imaging of the chest was performed following the standard protocol without IV contrast. COMPARISON:  None. FINDINGS: Cardiovascular: A left-sided venous Port-A-Cath is in place. There is mild to moderate severity calcification of the aortic arch. No significant vascular findings. Normal heart size with marked severity coronary artery calcification. No pericardial effusion. Mediastinum/Nodes: No enlarged mediastinal or axillary lymph nodes. The thyroid gland and trachea demonstrate no significant findings. Oral contrast is seen throughout the esophagus which is distended and stable in appearance Lungs/Pleura: There is marked severity emphysematous lung disease with mild to moderate severity areas of biapical scarring and/or atelectasis. Moderate severity areas of scarring and/or atelectasis are also seen within the bilateral lung bases and posterior aspects of the bilateral upper lobes, right greater than left. There is no evidence of a pleural effusion or pneumothorax. Upper Abdomen: No acute abnormality. Musculoskeletal: Degenerative changes seen throughout the thoracic spine. Compression fracture deformities are seen at the levels of T7, T8, T9 and T10 with evidence of prior vertebroplasty seen at the level of T12. Other: It should be noted that the study is limited secondary to patient motion. IMPRESSION: 1. Marked severity emphysematous lung disease with mild to moderate severity areas of biapical scarring and/or atelectasis. 2. Moderate severity areas of bilateral upper lobe and bibasilar scarring and/or atelectasis. 3. Compression fracture deformities of the thoracic spine with  evidence of prior vertebroplasty at the level of T12. Aortic Atherosclerosis (ICD10-I70.0) and Emphysema (ICD10-J43.9). Electronically Signed   By: Virgina Norfolk M.D.   On: 02/15/2021 21:32   DG Chest Port 1 View  Result Date: 02/15/2021 CLINICAL DATA:  Shortness of breath. EXAM: PORTABLE CHEST 1 VIEW COMPARISON:  CT angiogram chest and chest x-ray 11/11/2020. FINDINGS: Left chest port catheter tip projects over the distal SVC, unchanged. There are scattered interstitial/reticular opacities throughout both lungs similar to the prior exam. There is no new focal lung consolidation, pleural effusion or pneumothorax. The cardiomediastinal silhouette is within normal limits. Vertebral plasty changes are again seen in the lower thoracic and upper lumbar spine. No acute fractures are seen. IMPRESSION: Stable chronic interstitial opacities throughout both lungs. No acute lung infiltrate. Electronically Signed   By: Ronney Asters M.D.   On: 02/15/2021 20:14       Assessment & Plan:    Active Problems:   Anemia   COPD (chronic obstructive pulmonary disease) (HCC)   Chest pain   Dysphagia   Anemia Patient has known myelodysplastic syndrome, but hemoglobin is steadily trended down over 1 week FOBT pending Transfuse 1 unit Continue to monitor Chest pain/pressure Troponins flat Monitor on telemetry Echo in the a.m. as recommended by Dr. Raliegh Ip Symptomatic anemia contributing? If no improvement may be worth consulting GI for an EGD COPD Continue home Incruse Ellipta Dysphagia Patient is complaining of appetite, vomiting upon forcing himself to eat at previous oncology visits, but has not experienced solid food dysphagia previously per patient report Speech eval CT chest as above did not reveal an explanation Continue to monitor    DVT Prophylaxis-   pharmaceutical DVT prophylaxis secondary to downtrending hemoglobin and platelets 78- SCDs   AM Labs Ordered, also please review Full  Orders  Family Communication: No family at bedside  Code Status: Full  Admission status: Observation Time spent in minutes : [place] DO

## 2021-02-16 NOTE — Discharge Summary (Signed)
Physician Discharge Summary  [name] GEX:[id number] DOB: 1929-09-20 DOA: 02/15/2021  PCP: Sharilyn Sites, MD  Admit date: 02/15/2021 Discharge date: 02/16/2021  Admitted From: Home Disposition: Home  Recommendations for Outpatient Follow-up:  Follow up with PCP in 1-2 weeks Follow-up with medical oncology, Dr. Delton Coombes scheduled Please obtain CBC in one week  Home Health: No Equipment/Devices: None  Discharge Condition: Stable CODE STATUS: Full code Diet recommendation: Regular diet  History of present illness:  [name] is an 87 year old male with past medical history significant for myelodysplastic disorder, CKD stage IIIb, COPD, GERD, pulmonary nodule who presents to [facility] ED with report of shortness of breath and dysphagia.  Onset 3-4 days ago.  Recently saw by oncology who ordered Zofran without much improvement.  Reports laying flat makes symptoms worse.  Also with mild chest pressure, in addition to dyspnea which are nonexertional.  Patient states symptoms started when he was trying to swallow a piece of chicken at dinner.  He reports that his dysphagia is specific to solid foods and not liquids.  Denies coughing, no fevers, no palpitations.  In the ED, temperature 98.3 F, HR 73, RR 23, BP 120/72.  SPO2 95% on room air.  Sodium 136, potassium 3.8, chloride 108, CO2 23, glucose 82, BUN 40, creatinine 1.79, AST 16, ALT 13, total bilirubin 0.7.  BNP 231.0.  High sensitive troponin 13> 14.  WBC 5.9, hemoglobin 7.5, platelets 78, MCV 80.6. CT chest without contrast with marked severity emphysematous lung disease with mild/moderate areas of biapical scarring/atelectasis, moderate severity bilateral upper lobe/bibasilar scarring and/or atelectasis.  EKG with NSR, rate 76, QTc 414, no concerning dynamic changes.  EDP discussed with Dr. Antonieta Loveless, oncology who recommends overnight observation, TTE and transfusion.  TRH consulted for further evaluation  management.   Hospital course:  Symptomatic anemia Patient presenting with a hemoglobin of 7.5, subsequently dropped to 7.0.  Most recent hemoglobin 8.7.  Patient was transfused 1 unit PRBCs with appropriate increase hemoglobin to 8.1.  Patient states symptoms have now resolved.  Does not complain of any further weakness, fatigue, no chest pain or shortness of breath.  Patient states ready for discharge home.  He says he has close outpatient follow-up with his oncologist, with plans continued infusion tomorrow.  Recommend repeat CBC 1 week.  Atypical chest pain Patient presenting with chest pressure associated with dyspnea.  Etiology likely secondary to symptomatic anemia as above.  High sensitive troponins x2 within normal limits.  TTE with LVEF 60 to 65%, no regional wall motion normalities, mild LVH, IVC normal and no aortic stenosis.  Symptoms now resolved.  Outpatient follow-up PCP  Dysphagia Patient reports recent issues swallowing solids, reports onset while trying to swallow a piece of chicken at dinner.  No issues with liquids.  CT chest with no obvious finding.  Patient states symptoms now appear resolved, tolerating diet without issue.  Continue Zofran outpatient as needed.  If further episodes occur, consider barium swallow evaluation versus speech therapy/GI consultation.  COPD CT chest with severe emphysematous changes to the lungs.  Not oxygen dependent.  Continue home Incruse Ellipta inhaler.    Discharge Diagnoses:  Active Problems:   Anemia   COPD (chronic obstructive pulmonary disease) [facility])    Discharge Instructions  Discharge Instructions     Call MD for:  difficulty breathing, headache or visual disturbances   Complete by: As directed    Call MD for:  extreme fatigue   Complete by: As directed  Call MD for:  persistant dizziness or light-headedness   Complete by: As directed    Call MD for:  persistant nausea and vomiting   Complete by: As directed     Call MD for:  severe uncontrolled pain   Complete by: As directed    Call MD for:  temperature >100.4   Complete by: As directed    Diet - low sodium heart healthy   Complete by: As directed    Increase activity slowly   Complete by: As directed       Allergies as of 02/16/2021   No Known Allergies      Medication List     STOP taking these medications    tamsulosin 0.4 MG Caps capsule Commonly known as: FLOMAX       TAKE these medications    acetaminophen 325 MG tablet Commonly known as: TYLENOL Take 2 tablets (650 mg total) by mouth every 6 (six) hours as needed for mild pain, fever or headache (or Fever >/= 101).   albuterol 108 (90 Base) MCG/ACT inhaler Commonly known as: VENTOLIN HFA Inhale 2 puffs into the lungs every 4 (four) hours as needed for wheezing or shortness of breath.   megestrol 400 MG/10ML suspension Commonly known as: MEGACE Take 10 mLs (400 mg total) by mouth 2 (two) times daily.   ondansetron 4 MG tablet Commonly known as: ZOFRAN Take 1 tablet (4 mg total) by mouth every 6 (six) hours as needed for nausea or vomiting.   pantoprazole 40 MG tablet Commonly known as: PROTONIX Take 40 mg by mouth daily. Pt no longer taking   predniSONE 10 MG tablet Commonly known as: DELTASONE Take 10 mg by mouth daily with breakfast.   prochlorperazine 10 MG tablet Commonly known as: COMPAZINE Take 1 tablet (10 mg total) by mouth every 6 (six) hours as needed for nausea or vomiting.   Spiriva HandiHaler 18 MCG inhalation capsule Generic drug: tiotropium Place 1 capsule (18 mcg total) into inhaler and inhale daily.        Follow-up Information     Sharilyn Sites, MD. Schedule an appointment as soon as possible for a visit in 1 week(s).   Specialty: Family Medicine Why: repeat CBC to assess hemoglobin level Contact information: [address] [place] [address] [phone]         Derek Jack, MD Follow up.   Specialty:  Hematology Contact information: [place] [address] [phone]                No Known Allergies  Consultations: EDP discussed with oncology, Dr. Delton Coombes   Procedures/Studies: CT CHEST WO CONTRAST  Result Date: 02/15/2021 CLINICAL DATA:  Chest pain and shortness of breath. EXAM: CT CHEST WITHOUT CONTRAST TECHNIQUE: Multidetector CT imaging of the chest was performed following the standard protocol without IV contrast. COMPARISON:  None. FINDINGS: Cardiovascular: A left-sided venous Port-A-Cath is in place. There is mild to moderate severity calcification of the aortic arch. No significant vascular findings. Normal heart size with marked severity coronary artery calcification. No pericardial effusion. Mediastinum/Nodes: No enlarged mediastinal or axillary lymph nodes. The thyroid gland and trachea demonstrate no significant findings. Oral contrast is seen throughout the esophagus which is distended and stable in appearance Lungs/Pleura: There is marked severity emphysematous lung disease with mild to moderate severity areas of biapical scarring and/or atelectasis. Moderate severity areas of scarring and/or atelectasis are also seen within the bilateral lung bases and posterior aspects of the bilateral  upper lobes, right greater than left. There is no evidence of a pleural effusion or pneumothorax. Upper Abdomen: No acute abnormality. Musculoskeletal: Degenerative changes seen throughout the thoracic spine. Compression fracture deformities are seen at the levels of T7, T8, T9 and T10 with evidence of prior vertebroplasty seen at the level of T12. Other: It should be noted that the study is limited secondary to patient motion. IMPRESSION: 1. Marked severity emphysematous lung disease with mild to moderate severity areas of biapical scarring and/or atelectasis. 2. Moderate severity areas of bilateral upper lobe and bibasilar scarring and/or atelectasis. 3. Compression fracture  deformities of the thoracic spine with evidence of prior vertebroplasty at the level of T12. Aortic Atherosclerosis (ICD10-I70.0) and Emphysema (ICD10-J43.9). Electronically Signed   By: Virgina Norfolk M.D.   On: 02/15/2021 21:32   DG Chest Port 1 View  Result Date: 02/15/2021 CLINICAL DATA:  Shortness of breath. EXAM: PORTABLE CHEST 1 VIEW COMPARISON:  CT angiogram chest and chest x-ray 11/11/2020. FINDINGS: Left chest port catheter tip projects over the distal SVC, unchanged. There are scattered interstitial/reticular opacities throughout both lungs similar to the prior exam. There is no new focal lung consolidation, pleural effusion or pneumothorax. The cardiomediastinal silhouette is within normal limits. Vertebral plasty changes are again seen in the lower thoracic and upper lumbar spine. No acute fractures are seen. IMPRESSION: Stable chronic interstitial opacities throughout both lungs. No acute lung infiltrate. Electronically Signed   By: Ronney Asters M.D.   On: 02/15/2021 20:14   ECHOCARDIOGRAM COMPLETE  Result Date: 02/16/2021    ECHOCARDIOGRAM REPORT   Patient Name:   [name] Date of Exam: 02/16/2021 Medical Rec #:  [id number]        Height:       76.0 in Accession #:    [id number]       Weight:       122.0 lb Date of Birth:  [birth year]        BSA:          1.795 m Patient Age:    59 years         BP:           105/48 mmHg Patient Gender: M                HR:           73 bpm. Exam Location:  [facility] Procedure: 2D Echo, Cardiac Doppler and Color Doppler Indications:    Chemo Z09  History:        Patient has prior history of Echocardiogram examinations, most                 recent 11/12/2020. COPD. CKD. Chemo, Bone cancer ([place]) (From Hx),                 Myelodysplastic syndrome. Hx of COVID-19 virus infection.  Sonographer:    Alvino Chapel RCS Referring Phys: [id number] ASIA B [place]  1. Left ventricular ejection fraction, by estimation, is 60 to 65%. The left  ventricle has normal function. The left ventricle has no regional wall motion abnormalities. There is mild asymmetric left ventricular hypertrophy of the septal segment. Left ventricular diastolic parameters are indeterminate.  2. Right ventricular systolic function is normal. The right ventricular size is normal. There is normal pulmonary artery systolic pressure. The estimated right ventricular systolic pressure is 51.7 mmHg.  3. The mitral valve is grossly normal. Trivial mitral valve regurgitation.  4. The aortic valve  is tricuspid. There is mild calcification of the aortic valve. Aortic valve regurgitation is not visualized. Mild aortic valve sclerosis is present, with no evidence of aortic valve stenosis. Aortic valve mean gradient measures 3.0 mmHg.  5. The inferior vena cava is normal in size with greater than 50% respiratory variability, suggesting right atrial pressure of 3 mmHg. Comparison(s): Prior images reviewed side by side. No significant change in LVEF. FINDINGS  Left Ventricle: Left ventricular ejection fraction, by estimation, is 60 to 65%. The left ventricle has normal function. The left ventricle has no regional wall motion abnormalities. The left ventricular internal cavity size was normal in size. There is  mild asymmetric left ventricular hypertrophy of the septal segment. Left ventricular diastolic parameters are indeterminate. Right Ventricle: The right ventricular size is normal. No increase in right ventricular wall thickness. Right ventricular systolic function is normal. There is normal pulmonary artery systolic pressure. The tricuspid regurgitant velocity is 2.65 m/s, and  with an assumed right atrial pressure of 3 mmHg, the estimated right ventricular systolic pressure is 49.7 mmHg. Left Atrium: Left atrial size was normal in size. Right Atrium: Right atrial size was normal in size. Pericardium: There is no evidence of pericardial effusion. Presence of pericardial fat pad. Mitral  Valve: The mitral valve is grossly normal. Mild mitral annular calcification. Trivial mitral valve regurgitation. Tricuspid Valve: The tricuspid valve is grossly normal. Tricuspid valve regurgitation is mild. Aortic Valve: The aortic valve is tricuspid. There is mild calcification of the aortic valve. There is moderate aortic valve annular calcification. Aortic valve regurgitation is not visualized. Mild aortic valve sclerosis is present, with no evidence of aortic valve stenosis. Aortic valve mean gradient measures 3.0 mmHg. Aortic valve peak gradient measures 6.1 mmHg. Aortic valve area, by VTI measures 3.17 cm. Pulmonic Valve: The pulmonic valve was grossly normal. Pulmonic valve regurgitation is trivial. Aorta: The aortic root is normal in size and structure. Venous: The inferior vena cava is normal in size with greater than 50% respiratory variability, suggesting right atrial pressure of 3 mmHg. IAS/Shunts: No atrial level shunt detected by color flow Doppler.  LEFT VENTRICLE PLAX 2D LVIDd:         3.50 cm  Diastology LVIDs:         2.30 cm  LV e' medial:    7.07 cm/s LV PW:         1.00 cm  LV E/e' medial:  7.8 LV IVS:        0.90 cm  LV e' lateral:   9.90 cm/s LVOT diam:     2.10 cm  LV E/e' lateral: 5.6 LV SV:         81 LV SV Index:   45 LVOT Area:     3.46 cm  RIGHT VENTRICLE RV S prime:     12.70 cm/s TAPSE (M-mode): 1.8 cm LEFT ATRIUM           Index       RIGHT ATRIUM           Index LA diam:      2.20 cm 1.23 cm/m  RA Area:     16.00 cm LA Vol (A2C): 33.0 ml 18.38 ml/m RA Volume:   47.50 ml  26.46 ml/m LA Vol (A4C): 36.7 ml 20.44 ml/m  AORTIC VALVE AV Area (Vmax):    3.15 cm AV Area (Vmean):   3.17 cm AV Area (VTI):     3.17 cm AV Vmax:  123.00 cm/s AV Vmean:          82.400 cm/s AV VTI:            0.257 m AV Peak Grad:      6.1 mmHg AV Mean Grad:      3.0 mmHg LVOT Vmax:         112.00 cm/s LVOT Vmean:        75.300 cm/s LVOT VTI:          0.235 m LVOT/AV VTI ratio: 0.91  AORTA Ao  Root diam: 3.70 cm MITRAL VALVE               TRICUSPID VALVE MV Area (PHT): 1.74 cm    TR Peak grad:   28.1 mmHg MV Decel Time: 435 msec    TR Vmax:        265.00 cm/s MV E velocity: 55.35 cm/s MV A velocity: 80.20 cm/s  SHUNTS MV E/A ratio:  0.69        Systemic VTI:  0.24 m                            Systemic Diam: 2.10 cm Rozann Lesches MD Electronically signed by Rozann Lesches MD Signature Date/Time: 02/16/2021/11:15:47 AM    Final      Subjective: Patient seen examined bedside, resting comfortably.  States his symptoms have now completely resolved after receiving a blood transfusion.  No issues with any further dysphagia, tolerating lunch without issue.  States ready for discharge home as he has follow-up with his infusion clinic for chemotherapy tomorrow.  No other questions or concerns at this time.  Denies headache, no fever/chills/night sweats, no nausea/vomiting/diarrhea, no chest pain, no palpitations, no shortness of breath, no current weakness/fatigue, no paresthesias.  No acute events overnight per nursing staff.  Discharge Exam: Vitals:   02/16/21 1130 02/16/21 1200  BP: (!) 102/55 (!) 99/52  Pulse: 70 68  Resp: 13 17  Temp:    SpO2: 91% 94%   Vitals:   02/16/21 1000 02/16/21 1030 02/16/21 1130 02/16/21 1200  BP: 106/63 112/64 (!) 102/55 (!) 99/52  Pulse: 68 63 70 68  Resp: 19 13 13 17   Temp:  98.2 F (36.8 C)    TempSrc:  Oral    SpO2: 95% 94% 91% 94%  Weight:      Height:        General: Pt is alert, awake, not in acute distress, elderly in appearance Cardiovascular: RRR, S1/S2 +, no rubs, no gallops Respiratory: CTA bilaterally, no wheezing, no rhonchi, on room air Abdominal: Soft, NT, ND, bowel sounds + Extremities: no edema, no cyanosis    The results of significant diagnostics from this hospitalization (including imaging, microbiology, ancillary and laboratory) are listed below for reference.     Microbiology: No results found for this or any previous  visit (from the past 240 hour(s)).   Labs: BNP (last 3 results) Recent Labs    02/15/21 1946  BNP 147.8*   Basic Metabolic Panel: Recent Labs  Lab 02/10/21 0842 02/15/21 1946 02/16/21 0400  NA 136 136 135  K 3.8 3.8 4.2  CL 105 108 108  CO2 22 23 24   GLUCOSE 92 82 84  BUN 38* 40* 38*  CREATININE 2.32* 1.79* 1.66*  CALCIUM 10.3 8.8* 8.4*   Liver Function Tests: Recent Labs  Lab 02/10/21 0842 02/15/21 1946 02/16/21 0400  AST 18 16 15   ALT 13 13  14  ALKPHOS 75 62 55  BILITOT 1.2 0.7 0.8  PROT 7.0 6.5 6.2*  ALBUMIN 3.3* 3.3* 3.0*   No results for input(s): LIPASE, AMYLASE in the last 168 hours. No results for input(s): AMMONIA in the last 168 hours. CBC: Recent Labs  Lab 02/10/21 0842 02/15/21 1946 02/16/21 0400 02/16/21 1222  WBC 4.3 5.9 5.4 5.9  NEUTROABS 3.2  --   --   --   HGB 8.7* 7.5* 7.0* 8.1*  HCT 28.5* 23.7* 22.2* 25.9*  MCV 81.2 80.6 82.2 82.5  PLT 61* 78* 73* 72*   Cardiac Enzymes: No results for input(s): CKTOTAL, CKMB, CKMBINDEX, TROPONINI in the last 168 hours. BNP: Invalid input(s): POCBNP CBG: No results for input(s): GLUCAP in the last 168 hours. D-Dimer No results for input(s): DDIMER in the last 72 hours. Hgb A1c No results for input(s): HGBA1C in the last 72 hours. Lipid Profile No results for input(s): CHOL, HDL, LDLCALC, TRIG, CHOLHDL, LDLDIRECT in the last 72 hours. Thyroid function studies No results for input(s): TSH, T4TOTAL, T3FREE, THYROIDAB in the last 72 hours.  Invalid input(s): FREET3 Anemia work up No results for input(s): VITAMINB12, FOLATE, FERRITIN, TIBC, IRON, RETICCTPCT in the last 72 hours. Urinalysis    Component Value Date/Time   COLORURINE YELLOW 06/26/2020 1823   APPEARANCEUR HAZY (A) 06/26/2020 1823   APPEARANCEUR Hazy (A) 02/26/2020 0910   LABSPEC 1.026 06/26/2020 1823   PHURINE 5.0 06/26/2020 1823   GLUCOSEU NEGATIVE 06/26/2020 1823   HGBUR SMALL (A) 06/26/2020 1823   BILIRUBINUR NEGATIVE  06/26/2020 1823   BILIRUBINUR Negative 02/26/2020 0910   KETONESUR NEGATIVE 06/26/2020 1823   PROTEINUR NEGATIVE 06/26/2020 1823   UROBILINOGEN 0.2 08/15/2013 1240   NITRITE NEGATIVE 06/26/2020 1823   LEUKOCYTESUR MODERATE (A) 06/26/2020 1823   Sepsis Labs Invalid input(s): PROCALCITONIN,  WBC,  LACTICIDVEN Microbiology No results found for this or any previous visit (from the past 240 hour(s)).   Time coordinating discharge: Over 30 minutes  SIGNED:   Donnamarie Poag [place] DO  [facility] 02/16/2021, 1:25 PM

## 2021-02-17 ENCOUNTER — Inpatient Hospital Stay (HOSPITAL_COMMUNITY): Payer: Medicare Other | Attending: Hematology

## 2021-02-17 ENCOUNTER — Other Ambulatory Visit: Payer: Self-pay

## 2021-02-17 ENCOUNTER — Inpatient Hospital Stay (HOSPITAL_COMMUNITY): Payer: Medicare Other

## 2021-02-17 DIAGNOSIS — Z5111 Encounter for antineoplastic chemotherapy: Secondary | ICD-10-CM | POA: Insufficient documentation

## 2021-02-17 DIAGNOSIS — Z79899 Other long term (current) drug therapy: Secondary | ICD-10-CM | POA: Insufficient documentation

## 2021-02-17 DIAGNOSIS — N189 Chronic kidney disease, unspecified: Secondary | ICD-10-CM | POA: Insufficient documentation

## 2021-02-17 DIAGNOSIS — D469 Myelodysplastic syndrome, unspecified: Secondary | ICD-10-CM | POA: Diagnosis not present

## 2021-02-17 DIAGNOSIS — D61818 Other pancytopenia: Secondary | ICD-10-CM | POA: Insufficient documentation

## 2021-02-17 LAB — CBC WITH DIFFERENTIAL/PLATELET
Abs Immature Granulocytes: 0.09 10*3/uL — ABNORMAL HIGH (ref 0.00–0.07)
Basophils Absolute: 0 10*3/uL (ref 0.0–0.1)
Basophils Relative: 0 %
Eosinophils Absolute: 0 10*3/uL (ref 0.0–0.5)
Eosinophils Relative: 0 %
HCT: 28.1 % — ABNORMAL LOW (ref 39.0–52.0)
Hemoglobin: 9.1 g/dL — ABNORMAL LOW (ref 13.0–17.0)
Immature Granulocytes: 1 %
Lymphocytes Relative: 10 %
Lymphs Abs: 0.7 10*3/uL (ref 0.7–4.0)
MCH: 26.6 pg (ref 26.0–34.0)
MCHC: 32.4 g/dL (ref 30.0–36.0)
MCV: 82.2 fL (ref 80.0–100.0)
Monocytes Absolute: 0.4 10*3/uL (ref 0.1–1.0)
Monocytes Relative: 5 %
Neutro Abs: 5.9 10*3/uL (ref 1.7–7.7)
Neutrophils Relative %: 84 %
Platelets: 78 10*3/uL — ABNORMAL LOW (ref 150–400)
RBC: 3.42 MIL/uL — ABNORMAL LOW (ref 4.22–5.81)
RDW: 16.7 % — ABNORMAL HIGH (ref 11.5–15.5)
WBC: 7.1 10*3/uL (ref 4.0–10.5)
nRBC: 0.3 % — ABNORMAL HIGH (ref 0.0–0.2)

## 2021-02-17 LAB — COMPREHENSIVE METABOLIC PANEL
ALT: 14 U/L (ref 0–44)
AST: 16 U/L (ref 15–41)
Albumin: 3.3 g/dL — ABNORMAL LOW (ref 3.5–5.0)
Alkaline Phosphatase: 63 U/L (ref 38–126)
Anion gap: 6 (ref 5–15)
BUN: 32 mg/dL — ABNORMAL HIGH (ref 8–23)
CO2: 25 mmol/L (ref 22–32)
Calcium: 9 mg/dL (ref 8.9–10.3)
Chloride: 104 mmol/L (ref 98–111)
Creatinine, Ser: 1.76 mg/dL — ABNORMAL HIGH (ref 0.61–1.24)
GFR, Estimated: 37 mL/min — ABNORMAL LOW (ref >60)
Glucose, Bld: 114 mg/dL — ABNORMAL HIGH (ref 70–99)
Potassium: 3.8 mmol/L (ref 3.5–5.1)
Sodium: 135 mmol/L (ref 135–145)
Total Bilirubin: 1.1 mg/dL (ref 0.3–1.2)
Total Protein: 6.6 g/dL (ref 6.5–8.1)

## 2021-02-17 LAB — TYPE AND SCREEN
ABO/RH(D): B NEG
Antibody Screen: NEGATIVE
Unit division: 0

## 2021-02-17 LAB — SAMPLE TO BLOOD BANK

## 2021-02-17 LAB — BPAM RBC
Blood Product Expiration Date: 202210132359
ISSUE DATE / TIME: 202210020824
Unit Type and Rh: 1700

## 2021-02-17 MED ORDER — HEPARIN SOD (PORK) LOCK FLUSH 100 UNIT/ML IV SOLN
500.0000 [IU] | Freq: Once | INTRAVENOUS | Status: AC
  Administered 2021-02-17: 500 [IU] via INTRAVENOUS

## 2021-02-17 MED ORDER — SODIUM CHLORIDE 0.9% FLUSH
10.0000 mL | INTRAVENOUS | Status: AC | PRN
  Administered 2021-02-17: 10 mL via INTRAVENOUS

## 2021-02-17 NOTE — Progress Notes (Signed)
Patients port flushed without difficulty.  Good blood return noted with no bruising or swelling noted at site.  Stable during access and blood draw.  Patient to remain accessed for treatment. 

## 2021-02-17 NOTE — Addendum Note (Signed)
Addended by: Joanne Gavel T on: 02/17/2021 09:04 AM   Modules accepted: Orders

## 2021-02-17 NOTE — Progress Notes (Signed)
Hemoglobin 9.1.  No transfusion needed. Pt aware and verbalized understanding.

## 2021-02-24 ENCOUNTER — Other Ambulatory Visit: Payer: Self-pay

## 2021-02-24 ENCOUNTER — Inpatient Hospital Stay (HOSPITAL_COMMUNITY): Payer: Medicare Other | Admitting: Dietician

## 2021-02-24 ENCOUNTER — Inpatient Hospital Stay (HOSPITAL_COMMUNITY): Payer: Medicare Other

## 2021-02-24 ENCOUNTER — Other Ambulatory Visit (HOSPITAL_COMMUNITY): Payer: Self-pay | Admitting: Physician Assistant

## 2021-02-24 DIAGNOSIS — N3000 Acute cystitis without hematuria: Secondary | ICD-10-CM

## 2021-02-24 DIAGNOSIS — R300 Dysuria: Secondary | ICD-10-CM

## 2021-02-24 DIAGNOSIS — D469 Myelodysplastic syndrome, unspecified: Secondary | ICD-10-CM

## 2021-02-24 DIAGNOSIS — Z5111 Encounter for antineoplastic chemotherapy: Secondary | ICD-10-CM | POA: Diagnosis not present

## 2021-02-24 LAB — URINALYSIS, MICROSCOPIC (REFLEX)
Bacteria, UA: NONE SEEN
Squamous Epithelial / HPF: NONE SEEN (ref 0–5)
WBC, UA: >50 WBC/hpf (ref 0–5)

## 2021-02-24 LAB — CBC WITH DIFFERENTIAL/PLATELET
Abs Immature Granulocytes: 0.14 10*3/uL — ABNORMAL HIGH (ref 0.00–0.07)
Basophils Absolute: 0 10*3/uL (ref 0.0–0.1)
Basophils Relative: 0 %
Eosinophils Absolute: 0 10*3/uL (ref 0.0–0.5)
Eosinophils Relative: 0 %
HCT: 25.9 % — ABNORMAL LOW (ref 39.0–52.0)
Hemoglobin: 8.1 g/dL — ABNORMAL LOW (ref 13.0–17.0)
Immature Granulocytes: 2 %
Lymphocytes Relative: 11 %
Lymphs Abs: 0.8 10*3/uL (ref 0.7–4.0)
MCH: 25.9 pg — ABNORMAL LOW (ref 26.0–34.0)
MCHC: 31.3 g/dL (ref 30.0–36.0)
MCV: 82.7 fL (ref 80.0–100.0)
Monocytes Absolute: 0.3 10*3/uL (ref 0.1–1.0)
Monocytes Relative: 5 %
Neutro Abs: 6.3 10*3/uL (ref 1.7–7.7)
Neutrophils Relative %: 82 %
Platelets: 33 10*3/uL — ABNORMAL LOW (ref 150–400)
RBC: 3.13 MIL/uL — ABNORMAL LOW (ref 4.22–5.81)
RDW: 18.3 % — ABNORMAL HIGH (ref 11.5–15.5)
WBC: 7.6 10*3/uL (ref 4.0–10.5)
nRBC: 0 % (ref 0.0–0.2)

## 2021-02-24 LAB — URINALYSIS, ROUTINE W REFLEX MICROSCOPIC
Bilirubin Urine: NEGATIVE
Glucose, UA: NEGATIVE mg/dL
Ketones, ur: NEGATIVE mg/dL
Nitrite: NEGATIVE
Protein, ur: 30 mg/dL — AB
Specific Gravity, Urine: 1.02 (ref 1.005–1.030)
pH: 6 (ref 5.0–8.0)

## 2021-02-24 MED ORDER — HEPARIN SOD (PORK) LOCK FLUSH 100 UNIT/ML IV SOLN
500.0000 [IU] | Freq: Once | INTRAVENOUS | Status: AC
  Administered 2021-02-24: 500 [IU] via INTRAVENOUS

## 2021-02-24 MED ORDER — CEFDINIR 300 MG PO CAPS: 300.0000 mg | ORAL_CAPSULE | Freq: Every day | ORAL | 0 refills | Status: DC

## 2021-02-24 NOTE — Progress Notes (Signed)
Patient complaining of dysuria.  Urinalysis shows pyuria with urine culture pending.  (Last culture in November 2019 showed pansensitive E. coli.)  We will treat empirically for suspected acute uncomplicated cystitis, especially due to his immunosuppressed status as MDS patient on active treatment.  Prescription sent for cefdinir 300 mg once daily (renally dosed due to CrCl 22) x 7 days.  (Unable to use Macrobid or Bactrim due to renal function.)  We will follow urine culture.  Patient instructed to call PCP if symptoms do not resolve after completing antibiotic treatment.  Instructed to present to the emergency department if he develops any severe flank pain or systemic symptoms such as fever/chills.

## 2021-02-24 NOTE — Progress Notes (Signed)
Hemoglobin today is 8.1.  Patient is asymptomatic.  He will not receive blood transfusion today.  Patient c/o dysuria.  Urinalysis and urine culture were done per orders by Tarri Abernethy, PA-C.  Patient left via motorized wheelchair in satisfactory condition.

## 2021-02-24 NOTE — Progress Notes (Signed)
Nutrition Follow-up:  Patient with myelodysplastic syndrome. He is receiving Azacitidine.  Noted patient seen in ED on 10/1 due to dyspnea.  Met with patient in clinic. He reports feeling better s/p ED discharge. Patient reports he had eaten a piece of chicken from [facility] the prior evening and thinks some of the skin may have gotten hung in his throat. Patient reports he has been taking appetite stimulant, states he has not seen significant improvements. Patient reports switching to Ensure Complete as recommended. He is drinking 2/day. Patient is snacking during the day, recalls mostly fruit and trail mix. He eats popcorn in the evenings a couple times a week. Patient is planning to [place] for breakfast after visit today.    Medications: reviewed  Labs: reviewed  Anthropometrics: Last weight 123 lb 12.8 oz on 10/3 stable x 1 week. Patient weighed 122 lb 4.8 oz on 9/26 decreased from 125 lb 6.4 oz on 9/12   NUTRITION DIAGNOSIS:  Unintentional weight loss stable x 1 week   INTERVENTION:  Continue drinking Ensure Complete 2x/day - pt declined offer for coupons today Reviewed high calorie, high protein snack ideas and encouraged patient to eat a bedtime snack Continue appetite stimulant per MD Patient has contact information    MONITORING, EVALUATION, GOAL: weight trends, intake   NEXT VISIT: Thursday November 3 during infusion

## 2021-02-25 LAB — URINE CULTURE: Culture: 40000 — AB

## 2021-02-25 LAB — SAMPLE TO BLOOD BANK

## 2021-02-25 NOTE — Progress Notes (Signed)
Patient has been notified and verbalized understanding 

## 2021-02-26 ENCOUNTER — Other Ambulatory Visit (HOSPITAL_COMMUNITY): Payer: Self-pay

## 2021-02-26 DIAGNOSIS — D469 Myelodysplastic syndrome, unspecified: Secondary | ICD-10-CM

## 2021-02-26 DIAGNOSIS — D649 Anemia, unspecified: Secondary | ICD-10-CM

## 2021-03-03 ENCOUNTER — Inpatient Hospital Stay (HOSPITAL_COMMUNITY): Payer: Medicare Other

## 2021-03-03 ENCOUNTER — Encounter (HOSPITAL_COMMUNITY): Payer: Self-pay

## 2021-03-03 VITALS — BP 101/60 | HR 65 | Temp 97.6°F | Resp 20

## 2021-03-03 DIAGNOSIS — D649 Anemia, unspecified: Secondary | ICD-10-CM

## 2021-03-03 DIAGNOSIS — D469 Myelodysplastic syndrome, unspecified: Secondary | ICD-10-CM

## 2021-03-03 DIAGNOSIS — Z5111 Encounter for antineoplastic chemotherapy: Secondary | ICD-10-CM | POA: Diagnosis not present

## 2021-03-03 LAB — CBC WITH DIFFERENTIAL/PLATELET
Basophils Absolute: 0 10*3/uL (ref 0.0–0.1)
Basophils Relative: 0 %
Eosinophils Absolute: 0 10*3/uL (ref 0.0–0.5)
Eosinophils Relative: 0 %
HCT: 25 % — ABNORMAL LOW (ref 39.0–52.0)
Hemoglobin: 7.9 g/dL — ABNORMAL LOW (ref 13.0–17.0)
Lymphocytes Relative: 24 %
Lymphs Abs: 1 10*3/uL (ref 0.7–4.0)
MCH: 26.1 pg (ref 26.0–34.0)
MCHC: 31.6 g/dL (ref 30.0–36.0)
MCV: 82.5 fL (ref 80.0–100.0)
Monocytes Absolute: 0.2 10*3/uL (ref 0.1–1.0)
Monocytes Relative: 4 %
Neutro Abs: 3 10*3/uL (ref 1.7–7.7)
Neutrophils Relative %: 72 %
Platelets: 36 10*3/uL — ABNORMAL LOW (ref 150–400)
RBC: 3.03 MIL/uL — ABNORMAL LOW (ref 4.22–5.81)
RDW: 18 % — ABNORMAL HIGH (ref 11.5–15.5)
WBC: 4.1 10*3/uL (ref 4.0–10.5)
nRBC: 0 % (ref 0.0–0.2)

## 2021-03-03 LAB — PREPARE RBC (CROSSMATCH)

## 2021-03-03 LAB — SAMPLE TO BLOOD BANK

## 2021-03-03 MED ORDER — SODIUM CHLORIDE 0.9 % IV SOLN: INTRAVENOUS | Status: DC

## 2021-03-03 MED ORDER — ACETAMINOPHEN 325 MG PO TABS
650.0000 mg | ORAL_TABLET | Freq: Once | ORAL | Status: AC
  Administered 2021-03-03: 650 mg via ORAL
  Filled 2021-03-03: qty 2

## 2021-03-03 MED ORDER — SODIUM CHLORIDE 0.9% FLUSH
10.0000 mL | INTRAVENOUS | Status: AC | PRN
  Administered 2021-03-03: 10 mL

## 2021-03-03 MED ORDER — HEPARIN SOD (PORK) LOCK FLUSH 100 UNIT/ML IV SOLN
500.0000 [IU] | Freq: Every day | INTRAVENOUS | Status: AC | PRN
  Administered 2021-03-03: 500 [IU]

## 2021-03-03 MED ORDER — SODIUM CHLORIDE 0.9% IV SOLUTION
250.0000 mL | Freq: Once | INTRAVENOUS | Status: AC
  Administered 2021-03-03: 250 mL via INTRAVENOUS

## 2021-03-03 MED ORDER — DIPHENHYDRAMINE HCL 25 MG PO CAPS
25.0000 mg | ORAL_CAPSULE | Freq: Once | ORAL | Status: AC
  Administered 2021-03-03: 25 mg via ORAL
  Filled 2021-03-03: qty 1

## 2021-03-03 NOTE — Progress Notes (Signed)
Patients port flushed without difficulty.  Good blood return noted with no bruising or swelling noted at site.  Stable during access and blood draw.  Patient to remain accessed for possible transfusion. 

## 2021-03-03 NOTE — Patient Instructions (Signed)
[facility]  Discharge Instructions: Thank you for choosing [facility] to provide your oncology and hematology care.  If you have a lab appointment with the [facility], please come in thru the Main Entrance and check in at the main information desk.  Wear comfortable clothing and clothing appropriate for easy access to any Portacath or PICC line.   We strive to give you quality time with your provider. You may need to reschedule your appointment if you arrive late (15 or more minutes).  Arriving late affects you and other patients whose appointments are after yours.  Also, if you miss three or more appointments without notifying the office, you may be dismissed from the clinic at the provider's discretion.      For prescription refill requests, have your pharmacy contact our office and allow 72 hours for refills to be completed.    Today you received the following 1 unit of RBCs and 576mL of normal saline. Return as scheduled.   To help prevent nausea and vomiting after your treatment, we encourage you to take your nausea medication as directed.  BELOW ARE SYMPTOMS THAT SHOULD BE REPORTED IMMEDIATELY: *FEVER GREATER THAN 100.4 F (38 C) OR HIGHER *CHILLS OR SWEATING *NAUSEA AND VOMITING THAT IS NOT CONTROLLED WITH YOUR NAUSEA MEDICATION *UNUSUAL SHORTNESS OF BREATH *UNUSUAL BRUISING OR BLEEDING *URINARY PROBLEMS (pain or burning when urinating, or frequent urination) *BOWEL PROBLEMS (unusual diarrhea, constipation, pain near the anus) TENDERNESS IN MOUTH AND THROAT WITH OR WITHOUT PRESENCE OF ULCERS (sore throat, sores in mouth, or a toothache) UNUSUAL RASH, SWELLING OR PAIN  UNUSUAL VAGINAL DISCHARGE OR ITCHING   Items with * indicate a potential emergency and should be followed up as soon as possible or go to the Emergency Department if any problems should occur.  Please show the CHEMOTHERAPY ALERT CARD or IMMUNOTHERAPY ALERT CARD at check-in to the  Emergency Department and triage nurse.  Should you have questions after your visit or need to cancel or reschedule your appointment, please contact [facility] [phone]  and follow the prompts.  Office hours are 8:00 a.m. to 4:30 p.m. Monday - Friday. Please note that voicemails left after 4:00 p.m. may not be returned until the following business day.  We are closed weekends and major holidays. You have access to a nurse at all times for urgent questions. Please call the main number to the clinic [phone] and follow the prompts.  For any non-urgent questions, you may also contact your provider using MyChart. We now offer e-Visits for anyone 88 and older to request care online for non-urgent symptoms. For details visit [email or web address].   Also download the MyChart app! Go to the app store, search "MyChart", open the app, select [facility], and log in with your MyChart username and password.  Due to Covid, a mask is required upon entering the hospital/clinic. If you do not have a mask, one will be given to you upon arrival. For doctor visits, patients may have 1 support person aged 17 or older with them. For treatment visits, patients cannot have anyone with them due to current Covid guidelines and our immunocompromised population.

## 2021-03-03 NOTE — Progress Notes (Signed)
Patient presents today for possible blood products. Patient's blood pressure 76/45, 88/55, and 76/53, Tarri Abernethy, PA notified of blood pressures and patient's symptoms of fatigue. Received verbal orders for 1 liter of Saline over 2 hours. Hemoglobin resulted 7.9 Tarri Abernethy, [place] notified and received orders for 1 unit of PRBCs and to stop fluids once 570mL is administered.  Patient tolerated 1 unit of PRBCs and 524ml of normal saline with no complaints voiced. Port flushed with good blood return noted. No bruising or swelling at site. Bandaid applied and patient discharged in satisfactory condition. VVS stable with no signs or symptoms of distressed noted.

## 2021-03-04 LAB — TYPE AND SCREEN
ABO/RH(D): B NEG
Antibody Screen: NEGATIVE
Unit division: 0

## 2021-03-04 LAB — BPAM RBC
Blood Product Expiration Date: 202211152359
ISSUE DATE / TIME: 202210171059
Unit Type and Rh: 1700

## 2021-03-10 ENCOUNTER — Inpatient Hospital Stay (HOSPITAL_COMMUNITY): Payer: Medicare Other

## 2021-03-10 ENCOUNTER — Other Ambulatory Visit: Payer: Self-pay

## 2021-03-10 DIAGNOSIS — Z5111 Encounter for antineoplastic chemotherapy: Secondary | ICD-10-CM | POA: Diagnosis not present

## 2021-03-10 DIAGNOSIS — D649 Anemia, unspecified: Secondary | ICD-10-CM

## 2021-03-10 DIAGNOSIS — D469 Myelodysplastic syndrome, unspecified: Secondary | ICD-10-CM

## 2021-03-10 LAB — CBC WITH DIFFERENTIAL/PLATELET
Abs Immature Granulocytes: 0.06 10*3/uL (ref 0.00–0.07)
Basophils Absolute: 0 10*3/uL (ref 0.0–0.1)
Basophils Relative: 0 %
Eosinophils Absolute: 0 10*3/uL (ref 0.0–0.5)
Eosinophils Relative: 0 %
HCT: 27 % — ABNORMAL LOW (ref 39.0–52.0)
Hemoglobin: 8.4 g/dL — ABNORMAL LOW (ref 13.0–17.0)
Immature Granulocytes: 2 %
Lymphocytes Relative: 21 %
Lymphs Abs: 0.7 10*3/uL (ref 0.7–4.0)
MCH: 26.1 pg (ref 26.0–34.0)
MCHC: 31.1 g/dL (ref 30.0–36.0)
MCV: 83.9 fL (ref 80.0–100.0)
Monocytes Absolute: 0.3 10*3/uL (ref 0.1–1.0)
Monocytes Relative: 10 %
Neutro Abs: 2.3 10*3/uL (ref 1.7–7.7)
Neutrophils Relative %: 67 %
Platelets: 44 10*3/uL — ABNORMAL LOW (ref 150–400)
RBC: 3.22 MIL/uL — ABNORMAL LOW (ref 4.22–5.81)
RDW: 18.3 % — ABNORMAL HIGH (ref 11.5–15.5)
WBC: 3.4 10*3/uL — ABNORMAL LOW (ref 4.0–10.5)
nRBC: 0 % (ref 0.0–0.2)

## 2021-03-10 LAB — SAMPLE TO BLOOD BANK

## 2021-03-10 MED ORDER — SODIUM CHLORIDE 0.9% FLUSH
10.0000 mL | INTRAVENOUS | Status: AC | PRN
  Administered 2021-03-10: 10 mL via INTRAVENOUS

## 2021-03-10 MED ORDER — HEPARIN SOD (PORK) LOCK FLUSH 100 UNIT/ML IV SOLN
500.0000 [IU] | Freq: Once | INTRAVENOUS | Status: AC
  Administered 2021-03-10: 500 [IU] via INTRAVENOUS

## 2021-03-10 NOTE — Addendum Note (Signed)
Addended by: Tally Due on: 03/10/2021 09:38 AM   Modules accepted: Orders, SmartSet

## 2021-03-10 NOTE — Patient Instructions (Signed)
[facility]  Discharge Instructions: Thank you for choosing [facility] to provide your oncology and hematology care.  If you have a lab appointment with the Cancer Center, please come in thru the Main Entrance and check in at the main information desk.  Wear comfortable clothing and clothing appropriate for easy access to any Portacath or PICC line.   We strive to give you quality time with your provider. You may need to reschedule your appointment if you arrive late (15 or more minutes).  Arriving late affects you and other patients whose appointments are after yours.  Also, if you miss three or more appointments without notifying the office, you may be dismissed from the clinic at the provider's discretion.      For prescription refill requests, have your pharmacy contact our office and allow 72 hours for refills to be completed.        To help prevent nausea and vomiting after your treatment, we encourage you to take your nausea medication as directed.  BELOW ARE SYMPTOMS THAT SHOULD BE REPORTED IMMEDIATELY: *FEVER GREATER THAN 100.4 F (38 C) OR HIGHER *CHILLS OR SWEATING *NAUSEA AND VOMITING THAT IS NOT CONTROLLED WITH YOUR NAUSEA MEDICATION *UNUSUAL SHORTNESS OF BREATH *UNUSUAL BRUISING OR BLEEDING *URINARY PROBLEMS (pain or burning when urinating, or frequent urination) *BOWEL PROBLEMS (unusual diarrhea, constipation, pain near the anus) TENDERNESS IN MOUTH AND THROAT WITH OR WITHOUT PRESENCE OF ULCERS (sore throat, sores in mouth, or a toothache) UNUSUAL RASH, SWELLING OR PAIN  UNUSUAL VAGINAL DISCHARGE OR ITCHING   Items with * indicate a potential emergency and should be followed up as soon as possible or go to the Emergency Department if any problems should occur.  Please show the CHEMOTHERAPY ALERT CARD or IMMUNOTHERAPY ALERT CARD at check-in to the Emergency Department and triage nurse.  Should you have questions after your visit or need to cancel  or reschedule your appointment, please contact [facility] [phone]  and follow the prompts.  Office hours are 8:00 a.m. to 4:30 p.m. Monday - Friday. Please note that voicemails left after 4:00 p.m. may not be returned until the following business day.  We are closed weekends and major holidays. You have access to a nurse at all times for urgent questions. Please call the main number to the clinic [phone] and follow the prompts.  For any non-urgent questions, you may also contact your provider using MyChart. We now offer e-Visits for anyone 18 and older to request care online for non-urgent symptoms. For details visit [email or web address].   Also download the MyChart app! Go to the app store, search "MyChart", open the app, select , and log in with your MyChart username and password.  Due to Covid, a mask is required upon entering the hospital/clinic. If you do not have a mask, one will be given to you upon arrival. For doctor visits, patients may have 1 support person aged 18 or older with them. For treatment visits, patients cannot have anyone with them due to current Covid guidelines and our immunocompromised population.  

## 2021-03-10 NOTE — Progress Notes (Signed)
Patient presents today for PRBC.  Hemoglobin today is 8.4 and patient is asymptomatic.  Vital signs are stable.  We will hold blood transfusion today per protocol.  Patient advised to RTC as scheduled or sooner if symptomatic.  Patient left via motorized wheelchair in stable condition.

## 2021-03-10 NOTE — Progress Notes (Signed)
Patients port flushed without difficulty.  Good blood return noted with no bruising or swelling noted at site.  Stable during access and blood draw.  Patient to remain accessed for possible transfusion. 

## 2021-03-14 DIAGNOSIS — N401 Enlarged prostate with lower urinary tract symptoms: Secondary | ICD-10-CM | POA: Diagnosis not present

## 2021-03-14 DIAGNOSIS — J984 Other disorders of lung: Secondary | ICD-10-CM | POA: Diagnosis not present

## 2021-03-14 DIAGNOSIS — Z9981 Dependence on supplemental oxygen: Secondary | ICD-10-CM | POA: Diagnosis not present

## 2021-03-14 DIAGNOSIS — J449 Chronic obstructive pulmonary disease, unspecified: Secondary | ICD-10-CM | POA: Diagnosis not present

## 2021-03-14 DIAGNOSIS — N189 Chronic kidney disease, unspecified: Secondary | ICD-10-CM | POA: Diagnosis not present

## 2021-03-14 DIAGNOSIS — E441 Mild protein-calorie malnutrition: Secondary | ICD-10-CM | POA: Diagnosis not present

## 2021-03-14 DIAGNOSIS — D508 Other iron deficiency anemias: Secondary | ICD-10-CM | POA: Diagnosis not present

## 2021-03-14 DIAGNOSIS — Z681 Body mass index (BMI) 19 or less, adult: Secondary | ICD-10-CM | POA: Diagnosis not present

## 2021-03-15 NOTE — Progress Notes (Signed)
  [facility] [address] [place], [place] [address]   CLINIC:  Medical Oncology/Hematology  PCP:  Golding, John, MD [address] / [place]  [address] [phone]   REASON FOR VISIT:  Follow-up for MDS  PRIOR THERAPY: Aranesp, Retacrit, intermittent PRBC transfusions  NGS Results: not done  CURRENT THERAPY: Azacitidine (initiated on 09/16/2020)  BRIEF ONCOLOGIC HISTORY:  Oncology History  Myelodysplastic syndrome (HCC)  09/16/2020 -  Chemotherapy   Patient is on Treatment Plan : MYELODYSPLASIA  Azacitidine IV D1-7 q28d       CANCER STAGING: Cancer Staging No matching staging information was found for the patient.  INTERVAL HISTORY:  [name], a 87 y.o. male, returns for routine follow-up and consideration for next cycle of chemotherapy. Fortino was last seen on 02/10/2021.  Due for cycle #6 of Azacitidine today.   Overall, he tells me he has been feeling pretty well. He denies n/v/d. He reports waking up 4-9 times night to urinate, and he reports dysuria which has since resolved. His appetite is fair, and he denies weight loss. He is able to do his typical home activities unassisted. He denies abdominal pain, and any current bleeding. He drinks 8-9 glasses of water daily.   Overall, he feels ready for next cycle of chemo today.   REVIEW OF SYSTEMS:  Review of Systems  Constitutional:  Negative for appetite change, fatigue (60%) and unexpected weight change.  HENT:   Negative for nosebleeds.   Respiratory:  Negative for hemoptysis.   Gastrointestinal:  Negative for abdominal pain, blood in stool, diarrhea, nausea and vomiting.  Genitourinary:  Positive for difficulty urinating, frequency and nocturia. Negative for dysuria (resolved) and hematuria.   Hematological:  Does not bruise/bleed easily.  Psychiatric/Behavioral:  Positive for sleep disturbance.   All other systems reviewed and are negative.  PAST MEDICAL/SURGICAL HISTORY:   Past Medical History:  Diagnosis Date   Bone cancer (HCC)    Chronic kidney insufficiency    COPD (chronic obstructive pulmonary disease) (HCC)    GERD (gastroesophageal reflux disease)    Pulmonary nodule    Past Surgical History:  Procedure Laterality Date   BACK SURGERY     CATARACT EXTRACTION W/PHACO Right 03/09/2016   Procedure: CATARACT EXTRACTION PHACO AND INTRAOCULAR LENS PLACEMENT RIGHT EYE CDE=8.58;  Surgeon: Kerry Hunt, MD;  Location: [facility];  Service: Ophthalmology;  Laterality: Right;  right   CATARACT EXTRACTION W/PHACO Left 04/13/2016   Procedure: CATARACT EXTRACTION PHACO AND INTRAOCULAR LENS PLACEMENT (IOC);  Surgeon: Kerry Hunt, MD;  Location: [facility];  Service: Ophthalmology;  Laterality: Left;  CDE: 8.08   KYPHOPLASTY Bilateral 06/09/2019   Procedure: T11 KYPHOPLASTY;  Surgeon: Nundkumar, Neelesh, MD;  Location: [facility];  Service: Neurosurgery;  Laterality: Bilateral;   None to Date  10/01/15   PORTACATH PLACEMENT Left 10/11/2020   Procedure: INSERTION PORT-A-CATH (attached catheter in left internal jugular);  Surgeon: Bridges, Lindsay C, MD;  Location: [facility];  Service: General;  Laterality: Left;    SOCIAL HISTORY:  Social History   Socioeconomic History   Marital status: Widowed    Spouse name: Not on file   Number of children: Not on file   Years of education: Not on file   Highest education level: Not on file  Occupational History   Not on file  Tobacco Use   Smoking status: Former    Years: 15.00    Types: Cigarettes, Cigars    Quit date: 10/01/1990    Years   since quitting: 30.4   Smokeless tobacco: Never   Tobacco comments:    Quit x 25-30 years; 2 cigars daily when smoked  Vaping Use   Vaping Use: Never used  Substance and Sexual Activity   Alcohol use: Not Currently    Alcohol/week: 0.0 standard drinks   Drug use: No   Sexual activity: Never    Birth control/protection: None  Other Topics Concern   Not on file  Social History Narrative    Not on file   Social Determinants of Health   Financial Resource Strain: High Risk   Difficulty of Paying Living Expenses: Hard  Food Insecurity: No Food Insecurity   Worried About Running Out of Food in the Last Year: Never true   Ran Out of Food in the Last Year: Never true  Transportation Needs: No Transportation Needs   Lack of Transportation (Medical): No   Lack of Transportation (Non-Medical): No  Physical Activity: Inactive   Days of Exercise per Week: 0 days   Minutes of Exercise per Session: 0 min  Stress: No Stress Concern Present   Feeling of Stress : Not at all  Social Connections: Moderately Integrated   Frequency of Communication with Friends and Family: More than three times a week   Frequency of Social Gatherings with Friends and Family: More than three times a week   Attends Religious Services: More than 4 times per year   Active Member of [employer] or Organizations: Yes   Attends Archivist Meetings: More than 4 times per year   Marital Status: Widowed  Human resources officer Violence: Not At Risk   Fear of Current or Ex-Partner: No   Emotionally Abused: No   Physically Abused: No   Sexually Abused: No    FAMILY HISTORY:  Family History  Problem Relation Age of Onset   Kidney disease Mother    Kidney disease Sister    Colon cancer Neg Hx    Gastric cancer Neg Hx    Esophageal cancer Neg Hx     CURRENT MEDICATIONS:  Current Outpatient Medications  Medication Sig Dispense Refill   acetaminophen (TYLENOL) 325 MG tablet Take 2 tablets (650 mg total) by mouth every 6 (six) hours as needed for mild pain, fever or headache (or Fever >/= 101). 30 tablet 2   albuterol (VENTOLIN HFA) 108 (90 Base) MCG/ACT inhaler Inhale 2 puffs into the lungs every 4 (four) hours as needed for wheezing or shortness of breath. 18 g 2   cefdinir (OMNICEF) 300 MG capsule Take 1 capsule (300 mg total) by mouth daily. For UTI 7 capsule 0   megestrol (MEGACE) 400 MG/10ML suspension  Take 10 mLs (400 mg total) by mouth 2 (two) times daily. 480 mL 1   ondansetron (ZOFRAN) 4 MG tablet Take 1 tablet (4 mg total) by mouth every 6 (six) hours as needed for nausea or vomiting. 12 tablet 0   pantoprazole (PROTONIX) 40 MG tablet Take 40 mg by mouth daily. Pt no longer taking     predniSONE (DELTASONE) 10 MG tablet Take 10 mg by mouth daily with breakfast.     prochlorperazine (COMPAZINE) 10 MG tablet Take 1 tablet (10 mg total) by mouth every 6 (six) hours as needed for nausea or vomiting. 30 tablet 3   tiotropium (SPIRIVA HANDIHALER) 18 MCG inhalation capsule Place 1 capsule (18 mcg total) into inhaler and inhale daily. 30 capsule 12   No current facility-administered medications for this visit.   Facility-Administered Medications Ordered in  Other Visits  Medication Dose Route Frequency Provider Last Rate Last Admin   0.9 %  sodium chloride infusion   Intravenous Continuous Derek Jack, MD   Stopped at 01/06/21 1018   sodium chloride flush (NS) 0.9 % injection 10 mL  10 mL Intracatheter PRN Derek Jack, MD   10 mL at 01/06/21 1215   sodium chloride flush (NS) 0.9 % injection 10 mL  10 mL Intravenous PRN Derek Jack, MD   10 mL at 02/17/21 0902   sodium chloride flush (NS) 0.9 % injection 10 mL  10 mL Intravenous PRN Derek Jack, MD   10 mL at 03/10/21 0936    ALLERGIES:  No Known Allergies  PHYSICAL EXAM:  Performance status (ECOG): 2 - Symptomatic, <50% confined to bed  There were no vitals filed for this visit. Wt Readings from Last 3 Encounters:  03/03/21 124 lb (56.2 kg)  02/17/21 123 lb 12.8 oz (56.2 kg)  02/15/21 122 lb (55.3 kg)   Physical Exam Vitals reviewed.  Constitutional:      Appearance: Normal appearance.  Cardiovascular:     Rate and Rhythm: Normal rate and regular rhythm.     Pulses: Normal pulses.     Heart sounds: Normal heart sounds.  Pulmonary:     Effort: Pulmonary effort is normal.     Breath sounds:  Normal breath sounds.  Abdominal:     Palpations: Abdomen is soft. There is no hepatomegaly, splenomegaly or mass.     Tenderness: There is no abdominal tenderness.  Musculoskeletal:     Right lower leg: No edema.     Left lower leg: No edema.  Neurological:     General: No focal deficit present.     Mental Status: He is alert and oriented to person, place, and time.  Psychiatric:        Mood and Affect: Mood normal.        Behavior: Behavior normal.    LABORATORY DATA:  I have reviewed the labs as listed.  CBC Latest Ref Rng & Units 03/10/2021 03/03/2021 02/24/2021  WBC 4.0 - 10.5 K/uL 3.4(L) 4.1 7.6  Hemoglobin 13.0 - 17.0 g/dL 8.4(L) 7.9(L) 8.1(L)  Hematocrit 39.0 - 52.0 % 27.0(L) 25.0(L) 25.9(L)  Platelets 150 - 400 K/uL 44(L) 36(L) 33(L)   CMP Latest Ref Rng & Units 02/17/2021 02/16/2021 02/15/2021  Glucose 70 - 99 mg/dL 114(H) 84 82  BUN 8 - 23 mg/dL 32(H) 38(H) 40(H)  Creatinine 0.61 - 1.24 mg/dL 1.76(H) 1.66(H) 1.79(H)  Sodium 135 - 145 mmol/L 135 135 136  Potassium 3.5 - 5.1 mmol/L 3.8 4.2 3.8  Chloride 98 - 111 mmol/L 104 108 108  CO2 22 - 32 mmol/L _0 Calcium 8.9 - 10.3 mg/dL 9.0 8.4(L) 8.8(L)  Total Protein 6.5 - 8.1 g/dL 6.6 6.2(L) 6.5  Total Bilirubin 0.3 - 1.2 mg/dL 1.1 0.8 0.7  Alkaline Phos 38 - 126 U/L 63 55 62  AST 15 - 41 U/L _1 ALT 0 - 44 U/L _2 DIAGNOSTIC IMAGING:  I have independently reviewed the scans and discussed with the patient. CT CHEST WO CONTRAST  Result Date: 02/15/2021 CLINICAL DATA:  Chest pain and shortness of breath. EXAM: CT CHEST WITHOUT CONTRAST TECHNIQUE: Multidetector CT imaging of the chest was performed following the standard protocol without IV contrast. COMPARISON:  None. FINDINGS: Cardiovascular: A left-sided venous Port-A-Cath is in place. There is mild to moderate severity calcification of the aortic  arch. No significant vascular findings. Normal heart size with marked severity coronary artery  calcification. No pericardial effusion. Mediastinum/Nodes: No enlarged mediastinal or axillary lymph nodes. The thyroid gland and trachea demonstrate no significant findings. Oral contrast is seen throughout the esophagus which is distended and stable in appearance Lungs/Pleura: There is marked severity emphysematous lung disease with mild to moderate severity areas of biapical scarring and/or atelectasis. Moderate severity areas of scarring and/or atelectasis are also seen within the bilateral lung bases and posterior aspects of the bilateral upper lobes, right greater than left. There is no evidence of a pleural effusion or pneumothorax. Upper Abdomen: No acute abnormality. Musculoskeletal: Degenerative changes seen throughout the thoracic spine. Compression fracture deformities are seen at the levels of T7, T8, T9 and T10 with evidence of prior vertebroplasty seen at the level of T12. Other: It should be noted that the study is limited secondary to patient motion. IMPRESSION: 1. Marked severity emphysematous lung disease with mild to moderate severity areas of biapical scarring and/or atelectasis. 2. Moderate severity areas of bilateral upper lobe and bibasilar scarring and/or atelectasis. 3. Compression fracture deformities of the thoracic spine with evidence of prior vertebroplasty at the level of T12. Aortic Atherosclerosis (ICD10-I70.0) and Emphysema (ICD10-J43.9). Electronically Signed   By: Thaddeus  Houston M.D.   On: 02/15/2021 21:32   DG Chest Port 1 View  Result Date: 02/15/2021 CLINICAL DATA:  Shortness of breath. EXAM: PORTABLE CHEST 1 VIEW COMPARISON:  CT angiogram chest and chest x-ray 11/11/2020. FINDINGS: Left chest port catheter tip projects over the distal SVC, unchanged. There are scattered interstitial/reticular opacities throughout both lungs similar to the prior exam. There is no new focal lung consolidation, pleural effusion or pneumothorax. The cardiomediastinal silhouette is within  normal limits. Vertebral plasty changes are again seen in the lower thoracic and upper lumbar spine. No acute fractures are seen. IMPRESSION: Stable chronic interstitial opacities throughout both lungs. No acute lung infiltrate. Electronically Signed   By: Amy  Guttmann M.D.   On: 02/15/2021 20:14   ECHOCARDIOGRAM COMPLETE  Result Date: 02/16/2021    ECHOCARDIOGRAM REPORT   Patient Name:   Barclay W Spink Date of Exam: 02/16/2021 Medical Rec #:  [id number]        Height:       76.0 in Accession #:    [id number]       Weight:       122.0 lb Date of Birth:  11/06/1929        BSA:          1.795 m Patient Age:    87 years         BP:           105/48 mmHg Patient Gender: M                HR:           73 bpm. Exam Location:  [facility] Procedure: 2D Echo, Cardiac Doppler and Color Doppler Indications:    Chemo Z09  History:        Patient has prior history of Echocardiogram examinations, most                 recent 11/12/2020. COPD. CKD. Chemo, Bone cancer (HCC) (From Hx),                 Myelodysplastic syndrome. Hx of COVID-19 virus infection.  Sonographer:    Bernard White RCS Referring Phys: [id number] ASIA B ZIERLE-GHOSH IMPRESSIONS  1. Left   ventricular ejection fraction, by estimation, is 60 to 65%. The left ventricle has normal function. The left ventricle has no regional wall motion abnormalities. There is mild asymmetric left ventricular hypertrophy of the septal segment. Left ventricular diastolic parameters are indeterminate.  2. Right ventricular systolic function is normal. The right ventricular size is normal. There is normal pulmonary artery systolic pressure. The estimated right ventricular systolic pressure is 31.1 mmHg.  3. The mitral valve is grossly normal. Trivial mitral valve regurgitation.  4. The aortic valve is tricuspid. There is mild calcification of the aortic valve. Aortic valve regurgitation is not visualized. Mild aortic valve sclerosis is present, with no evidence of aortic valve  stenosis. Aortic valve mean gradient measures 3.0 mmHg.  5. The inferior vena cava is normal in size with greater than 50% respiratory variability, suggesting right atrial pressure of 3 mmHg. Comparison(s): Prior images reviewed side by side. No significant change in LVEF. FINDINGS  Left Ventricle: Left ventricular ejection fraction, by estimation, is 60 to 65%. The left ventricle has normal function. The left ventricle has no regional wall motion abnormalities. The left ventricular internal cavity size was normal in size. There is  mild asymmetric left ventricular hypertrophy of the septal segment. Left ventricular diastolic parameters are indeterminate. Right Ventricle: The right ventricular size is normal. No increase in right ventricular wall thickness. Right ventricular systolic function is normal. There is normal pulmonary artery systolic pressure. The tricuspid regurgitant velocity is 2.65 m/s, and  with an assumed right atrial pressure of 3 mmHg, the estimated right ventricular systolic pressure is 31.1 mmHg. Left Atrium: Left atrial size was normal in size. Right Atrium: Right atrial size was normal in size. Pericardium: There is no evidence of pericardial effusion. Presence of pericardial fat pad. Mitral Valve: The mitral valve is grossly normal. Mild mitral annular calcification. Trivial mitral valve regurgitation. Tricuspid Valve: The tricuspid valve is grossly normal. Tricuspid valve regurgitation is mild. Aortic Valve: The aortic valve is tricuspid. There is mild calcification of the aortic valve. There is moderate aortic valve annular calcification. Aortic valve regurgitation is not visualized. Mild aortic valve sclerosis is present, with no evidence of aortic valve stenosis. Aortic valve mean gradient measures 3.0 mmHg. Aortic valve peak gradient measures 6.1 mmHg. Aortic valve area, by VTI measures 3.17 cm. Pulmonic Valve: The pulmonic valve was grossly normal. Pulmonic valve regurgitation is  trivial. Aorta: The aortic root is normal in size and structure. Venous: The inferior vena cava is normal in size with greater than 50% respiratory variability, suggesting right atrial pressure of 3 mmHg. IAS/Shunts: No atrial level shunt detected by color flow Doppler.  LEFT VENTRICLE PLAX 2D LVIDd:         3.50 cm  Diastology LVIDs:         2.30 cm  LV e' medial:    7.07 cm/s LV PW:         1.00 cm  LV E/e' medial:  7.8 LV IVS:        0.90 cm  LV e' lateral:   9.90 cm/s LVOT diam:     2.10 cm  LV E/e' lateral: 5.6 LV SV:         81 LV SV Index:   45 LVOT Area:     3.46 cm  RIGHT VENTRICLE RV S prime:     12.70 cm/s TAPSE (M-mode): 1.8 cm LEFT ATRIUM           Index       RIGHT   ATRIUM           Index LA diam:      2.20 cm 1.23 cm/m  RA Area:     16.00 cm LA Vol (A2C): 33.0 ml 18.38 ml/m RA Volume:   47.50 ml  26.46 ml/m LA Vol (A4C): 36.7 ml 20.44 ml/m  AORTIC VALVE AV Area (Vmax):    3.15 cm AV Area (Vmean):   3.17 cm AV Area (VTI):     3.17 cm AV Vmax:           123.00 cm/s AV Vmean:          82.400 cm/s AV VTI:            0.257 m AV Peak Grad:      6.1 mmHg AV Mean Grad:      3.0 mmHg LVOT Vmax:         112.00 cm/s LVOT Vmean:        75.300 cm/s LVOT VTI:          0.235 m LVOT/AV VTI ratio: 0.91  AORTA Ao Root diam: 3.70 cm MITRAL VALVE               TRICUSPID VALVE MV Area (PHT): 1.74 cm    TR Peak grad:   28.1 mmHg MV Decel Time: 435 msec    TR Vmax:        265.00 cm/s MV E velocity: 55.35 cm/s MV A velocity: 80.20 cm/s  SHUNTS MV E/A ratio:  0.69        Systemic VTI:  0.24 m                            Systemic Diam: 2.10 cm Samuel Mcdowell MD Electronically signed by Samuel Mcdowell MD Signature Date/Time: 02/16/2021/11:15:47 AM    Final      ASSESSMENT:  1.  Myelodysplastic syndrome with pancytopenia: - Bone marrow biopsy on 12/08/2016 in [place], [place] showed normocellular marrow with trilineage hematopoiesis with erythroid predominance and dysmegakaryopoiesis.  Blasts were not increased.   Cytogenetics showed 46, XY,del(12)(p.2p13)[8]/46,XY[12].  FISH was positive for 12p-. - Bone marrow biopsy and testing in 2018 was consistent with myelodysplastic syndrome with single lineage dysplasia (MDS-SLD),IPSS-R score of 2, consistent with low risk category -  Repeat bone marrow biopsy performed on 08/13/2019 by Dr. Katragadda with the following results: Hypercellular bone marrow with dyspoietic changes Bone marrow aspirate with 5% blastic cells of granulocytic precursors Flow pathology with 3% blasts Peripheral blood with 2% circulating blasts Cytogenetic analysis show del(12p) and 13 of the examined cells with remaining 7 cells showing normal chromosome Karyotype: 46,XY,del(12)(p13p11.2)[13]/46,XY[7] FISH analysis was normal - Based on most recent bone marrow biopsy (08/04/2020), patient's IPSS-R is 5, showing that he has progressed to the high risk category - He has required multiple blood transfusions over the past 6 months, most recently on 08/09/2020 (Hgb 7.1) - No improvement on monthly Aranesp or on weekly Retacrit injections (started on 07/10/2020 - discontinued 09/04/2020) - Stool occult blood cards x3 were negative -Azacitidine (50 mg/m2) x5 days started on 09/16/2020.   PLAN:  1.  Myelodysplastic syndrome (high risk) with pancytopenia: - He tolerated last cycle very well. - He denies any severe fatigue. - He received 1 unit PRBC on 02/16/2021 on 03/03/2021. - We have reviewed his labs today which shows normal LFTs.  CBC shows normal white count and ANC.  Platelet count improved to 63 and at baseline.    Hemoglobin is 8.0. - Proceed with cycle 6 treatment with azacitidine 50 mg/m2 for 5 days. - Continue weekly CBC for transfusions. - RTC 5 weeks for follow-up with repeat labs and next cycle. - Would consider bone marrow aspiration biopsy around cycle 7.  2.  CKD: - Creatinine today is 1.88 and at baseline.  3.  Bladder problems: - Reported incomplete evacuation of bladder.  No  dysuria. - Follow-up with urology.   Orders placed this encounter:  No orders of the defined types were placed in this encounter.    Derek Jack, MD [place] [phone]   I, Thana Ates, am acting as a scribe for Dr. Derek Jack.  I, Derek Jack MD, have reviewed the above documentation for accuracy and completeness, and I agree with the above.

## 2021-03-17 ENCOUNTER — Inpatient Hospital Stay (HOSPITAL_BASED_OUTPATIENT_CLINIC_OR_DEPARTMENT_OTHER): Payer: Medicare Other | Admitting: Hematology

## 2021-03-17 ENCOUNTER — Inpatient Hospital Stay (HOSPITAL_COMMUNITY): Payer: Medicare Other

## 2021-03-17 ENCOUNTER — Other Ambulatory Visit: Payer: Self-pay

## 2021-03-17 VITALS — BP 101/50 | HR 84 | Temp 96.2°F | Resp 18 | Wt 123.8 lb

## 2021-03-17 VITALS — BP 92/60 | HR 78 | Temp 98.0°F | Resp 18

## 2021-03-17 DIAGNOSIS — D469 Myelodysplastic syndrome, unspecified: Secondary | ICD-10-CM

## 2021-03-17 DIAGNOSIS — Z5111 Encounter for antineoplastic chemotherapy: Secondary | ICD-10-CM | POA: Diagnosis not present

## 2021-03-17 DIAGNOSIS — D649 Anemia, unspecified: Secondary | ICD-10-CM

## 2021-03-17 LAB — SAMPLE TO BLOOD BANK

## 2021-03-17 LAB — CBC WITH DIFFERENTIAL/PLATELET
Basophils Absolute: 0 10*3/uL (ref 0.0–0.1)
Basophils Relative: 0 %
Eosinophils Absolute: 0 10*3/uL (ref 0.0–0.5)
Eosinophils Relative: 0 %
HCT: 26.6 % — ABNORMAL LOW (ref 39.0–52.0)
Hemoglobin: 8 g/dL — ABNORMAL LOW (ref 13.0–17.0)
Lymphocytes Relative: 23 %
Lymphs Abs: 1.2 10*3/uL (ref 0.7–4.0)
MCH: 24.9 pg — ABNORMAL LOW (ref 26.0–34.0)
MCHC: 30.1 g/dL (ref 30.0–36.0)
MCV: 82.9 fL (ref 80.0–100.0)
Monocytes Absolute: 0.1 10*3/uL (ref 0.1–1.0)
Monocytes Relative: 2 %
Neutro Abs: 3.8 10*3/uL (ref 1.7–7.7)
Neutrophils Relative %: 75 %
Platelets: 63 10*3/uL — ABNORMAL LOW (ref 150–400)
RBC: 3.21 MIL/uL — ABNORMAL LOW (ref 4.22–5.81)
RDW: 18.1 % — ABNORMAL HIGH (ref 11.5–15.5)
WBC: 5 10*3/uL (ref 4.0–10.5)
nRBC: 0.4 % — ABNORMAL HIGH (ref 0.0–0.2)
nRBC: 1 /100 WBC — ABNORMAL HIGH

## 2021-03-17 LAB — COMPREHENSIVE METABOLIC PANEL
ALT: 11 U/L (ref 0–44)
AST: 17 U/L (ref 15–41)
Albumin: 3.4 g/dL — ABNORMAL LOW (ref 3.5–5.0)
Alkaline Phosphatase: 71 U/L (ref 38–126)
Anion gap: 6 (ref 5–15)
BUN: 31 mg/dL — ABNORMAL HIGH (ref 8–23)
CO2: 23 mmol/L (ref 22–32)
Calcium: 9.8 mg/dL (ref 8.9–10.3)
Chloride: 106 mmol/L (ref 98–111)
Creatinine, Ser: 1.88 mg/dL — ABNORMAL HIGH (ref 0.61–1.24)
GFR, Estimated: 34 mL/min — ABNORMAL LOW (ref >60)
Glucose, Bld: 97 mg/dL (ref 70–99)
Potassium: 3.9 mmol/L (ref 3.5–5.1)
Sodium: 135 mmol/L (ref 135–145)
Total Bilirubin: 0.8 mg/dL (ref 0.3–1.2)
Total Protein: 6.8 g/dL (ref 6.5–8.1)

## 2021-03-17 LAB — LACTATE DEHYDROGENASE: LDH: 128 U/L (ref 98–192)

## 2021-03-17 MED ORDER — PALONOSETRON HCL INJECTION 0.25 MG/5ML
0.2500 mg | Freq: Once | INTRAVENOUS | Status: AC
  Administered 2021-03-17: 0.25 mg via INTRAVENOUS
  Filled 2021-03-17: qty 5

## 2021-03-17 MED ORDER — SODIUM CHLORIDE 0.9 % IV SOLN: Freq: Once | INTRAVENOUS | Status: AC

## 2021-03-17 MED ORDER — SODIUM CHLORIDE 0.9 % IV SOLN
10.0000 mg | Freq: Once | INTRAVENOUS | Status: AC
  Administered 2021-03-17: 10 mg via INTRAVENOUS
  Filled 2021-03-17: qty 1

## 2021-03-17 MED ORDER — AZACITIDINE CHEMO INJECTION 100 MG
50.0000 mg/m2 | Freq: Once | INTRAMUSCULAR | Status: AC
Start: 2021-03-17 — End: 2021-03-17
  Administered 2021-03-17: 90 mg via INTRAVENOUS
  Filled 2021-03-17: qty 9

## 2021-03-17 MED ORDER — HEPARIN SOD (PORK) LOCK FLUSH 100 UNIT/ML IV SOLN
500.0000 [IU] | Freq: Once | INTRAVENOUS | Status: AC | PRN
  Administered 2021-03-17: 500 [IU]

## 2021-03-17 MED ORDER — SODIUM CHLORIDE 0.9% FLUSH
10.0000 mL | INTRAVENOUS | Status: DC | PRN
  Administered 2021-03-17: 10 mL

## 2021-03-17 NOTE — Patient Instructions (Signed)
[place]  Discharge Instructions: Thank you for choosing [facility] to provide your oncology and hematology care.  If you have a lab appointment with the [facility], please come in thru the Main Entrance and check in at the main information desk.  Wear comfortable clothing and clothing appropriate for easy access to any Portacath or PICC line.   We strive to give you quality time with your provider. You may need to reschedule your appointment if you arrive late (15 or more minutes).  Arriving late affects you and other patients whose appointments are after yours.  Also, if you miss three or more appointments without notifying the office, you may be dismissed from the clinic at the provider's discretion.      For prescription refill requests, have your pharmacy contact our office and allow 72 hours for refills to be completed.    Today you received the following chemotherapy and/or immunotherapy agents vidaza      To help prevent nausea and vomiting after your treatment, we encourage you to take your nausea medication as directed.  BELOW ARE SYMPTOMS THAT SHOULD BE REPORTED IMMEDIATELY: *FEVER GREATER THAN 100.4 F (38 C) OR HIGHER *CHILLS OR SWEATING *NAUSEA AND VOMITING THAT IS NOT CONTROLLED WITH YOUR NAUSEA MEDICATION *UNUSUAL SHORTNESS OF BREATH *UNUSUAL BRUISING OR BLEEDING *URINARY PROBLEMS (pain or burning when urinating, or frequent urination) *BOWEL PROBLEMS (unusual diarrhea, constipation, pain near the anus) TENDERNESS IN MOUTH AND THROAT WITH OR WITHOUT PRESENCE OF ULCERS (sore throat, sores in mouth, or a toothache) UNUSUAL RASH, SWELLING OR PAIN  UNUSUAL VAGINAL DISCHARGE OR ITCHING   Items with * indicate a potential emergency and should be followed up as soon as possible or go to the Emergency Department if any problems should occur.  Please show the CHEMOTHERAPY ALERT CARD or IMMUNOTHERAPY ALERT CARD at check-in to the Emergency  Department and triage nurse.  Should you have questions after your visit or need to cancel or reschedule your appointment, please contact [facility] [phone]  and follow the prompts.  Office hours are 8:00 a.m. to 4:30 p.m. Monday - Friday. Please note that voicemails left after 4:00 p.m. may not be returned until the following business day.  We are closed weekends and major holidays. You have access to a nurse at all times for urgent questions. Please call the main number to the clinic [phone] and follow the prompts.  For any non-urgent questions, you may also contact your provider using MyChart. We now offer e-Visits for anyone 52 and older to request care online for non-urgent symptoms. For details visit [email or web address].   Also download the MyChart app! Go to the app store, search "MyChart", open the app, select [facility], and log in with your MyChart username and password.  Due to Covid, a mask is required upon entering the hospital/clinic. If you do not have a mask, one will be given to you upon arrival. For doctor visits, patients may have 1 support person aged 22 or older with them. For treatment visits, patients cannot have anyone with them due to current Covid guidelines and our immunocompromised population.    Azacitidine suspension for injection (subcutaneous use) What is this medication? AZACITIDINE (ay [place]) is a chemotherapy drug. This medicine reduces the growth of cancer cells and can suppress the immune system. It is used for treating myelodysplastic syndrome or some types of leukemia. This medicine may be used for other purposes; ask your health  care provider or pharmacist if you have questions. COMMON BRAND NAME(S): Vidaza What should I tell my care team before I take this medication? They need to know if you have any of these conditions: kidney disease liver disease liver tumors an unusual or allergic reaction to azacitidine,  mannitol, other medicines, foods, dyes, or preservatives pregnant or trying to get pregnant breast-feeding How should I use this medication? This medicine is for injection under the skin. It is administered in a hospital or clinic by a specially trained health care professional. Talk to your pediatrician regarding the use of this medicine in children. While this drug may be prescribed for selected conditions, precautions do apply. Overdosage: If you think you have taken too much of this medicine contact a poison control center or emergency room at once. NOTE: This medicine is only for you. Do not share this medicine with others. What if I miss a dose? It is important not to miss your dose. Call your doctor or health care professional if you are unable to keep an appointment. What may interact with this medication? Interactions have not been studied. Give your health care provider a list of all the medicines, herbs, non-prescription drugs, or dietary supplements you use. Also tell them if you smoke, drink alcohol, or use illegal drugs. Some items may interact with your medicine. This list may not describe all possible interactions. Give your health care provider a list of all the medicines, herbs, non-prescription drugs, or dietary supplements you use. Also tell them if you smoke, drink alcohol, or use illegal drugs. Some items may interact with your medicine. What should I watch for while using this medication? Visit your doctor for checks on your progress. This drug may make you feel generally unwell. This is not uncommon, as chemotherapy can affect healthy cells as well as cancer cells. Report any side effects. Continue your course of treatment even though you feel ill unless your doctor tells you to stop. In some cases, you may be given additional medicines to help with side effects. Follow all directions for their use. Call your doctor or health care professional for advice if you get a fever,  chills or sore throat, or other symptoms of a cold or flu. Do not treat yourself. This drug decreases your body's ability to fight infections. Try to avoid being around people who are sick. This medicine may increase your risk to bruise or bleed. Call your doctor or health care professional if you notice any unusual bleeding. You may need blood work done while you are taking this medicine. Do not become pregnant while taking this medicine and for 6 months after the last dose. Women should inform their doctor if they wish to become pregnant or think they might be pregnant. Men should not father a child while taking this medicine and for 3 months after the last dose. There is a potential for serious side effects to an unborn child. Talk to your health care professional or pharmacist for more information. Do not breast-feed an infant while taking this medicine and for 1 week after the last dose. This medicine may interfere with the ability to have a child. Talk with your doctor or health care professional if you are concerned about your fertility. What side effects may I notice from receiving this medication? Side effects that you should report to your doctor or health care professional as soon as possible: allergic reactions like skin rash, itching or hives, swelling of the face, lips, or tongue  low blood counts - this medicine may decrease the number of white blood cells, red blood cells and platelets. You may be at increased risk for infections and bleeding. signs of infection - fever or chills, cough, sore throat, pain passing urine signs of decreased platelets or bleeding - bruising, pinpoint red spots on the skin, black, tarry stools, blood in the urine signs of decreased red blood cells - unusually weak or tired, fainting spells, lightheadedness signs and symptoms of kidney injury like trouble passing urine or change in the amount of urine signs and symptoms of liver injury like dark yellow or brown  urine; general ill feeling or flu-like symptoms; light-colored stools; loss of appetite; nausea; right upper belly pain; unusually weak or tired; yellowing of the eyes or skin Side effects that usually do not require medical attention (report to your doctor or health care professional if they continue or are bothersome): constipation diarrhea nausea, vomiting pain or redness at the injection site unusually weak or tired This list may not describe all possible side effects. Call your doctor for medical advice about side effects. You may report side effects to FDA at 1-800-FDA-1088. Where should I keep my medication? This drug is given in a hospital or clinic and will not be stored at home. NOTE: This sheet is a summary. It may not cover all possible information. If you have questions about this medicine, talk to your doctor, pharmacist, or health care provider.  2022 Elsevier/Gold Standard (2016-06-02 14:37:51)

## 2021-03-17 NOTE — Patient Instructions (Addendum)
[place] at [facility] Discharge Instructions  You were seen and examined today by Dr. Delton Coombes. We will proceed with your tx today and the next 4 days.  Return as scheduled in 5 weeks for lab work and treatment.     Thank you for choosing [place] at [facility] to provide your oncology and hematology care.  To afford each patient quality time with our provider, please arrive at least 15 minutes before your scheduled appointment time.   If you have a lab appointment with the [facility] please come in thru the Main Entrance and check in at the main information desk.  You need to re-schedule your appointment should you arrive 10 or more minutes late.  We strive to give you quality time with our providers, and arriving late affects you and other patients whose appointments are after yours.  Also, if you no show three or more times for appointments you may be dismissed from the clinic at the providers discretion.     Again, thank you for choosing [facility].  Our hope is that these requests will decrease the amount of time that you wait before being seen by our physicians.       _____________________________________________________________  Should you have questions after your visit to [facility], please contact our office at [phone] and follow the prompts.  Our office hours are 8:00 a.m. and 4:30 p.m. Monday - Friday.  Please note that voicemails left after 4:00 p.m. may not be returned until the following business day.  We are closed weekends and major holidays.  You do have access to a nurse 24-7, just call the main number to the clinic [phone] and do not press any options, hold on the line and a nurse will answer the phone.    For prescription refill requests, have your pharmacy contact our office and allow 72 hours.    Due to Covid, you will need to wear a mask upon entering the hospital. If you do  not have a mask, a mask will be given to you at the Main Entrance upon arrival. For doctor visits, patients may have 1 support person age 47 or older with them. For treatment visits, patients can not have anyone with them due to social distancing guidelines and our immunocompromised population.

## 2021-03-17 NOTE — Progress Notes (Signed)
Patient has been examined, vital signs and labs have been reviewed by Dr. Katragadda. ANC, Creatinine, LFTs, hemoglobin, and platelets are within treatment parameters per Dr. Katragadda. Patient may proceed with treatment per M.D.   

## 2021-03-17 NOTE — Progress Notes (Signed)
Pt here for D1 of vidaza. Vital signs WNL for treatment. Creatinine 1.88. good for treatment per Dr Raliegh Ip.   Tolerated treatment well today without incidence.  Stable during and after treatment.  AVS reviewed.  Vital signs stable prior to discharge.  Discharged in stable condition in motorized wheelchair.

## 2021-03-18 ENCOUNTER — Inpatient Hospital Stay (HOSPITAL_COMMUNITY): Payer: Medicare Other | Attending: Hematology

## 2021-03-18 ENCOUNTER — Encounter (HOSPITAL_COMMUNITY): Payer: Self-pay

## 2021-03-18 VITALS — BP 103/58 | HR 72 | Temp 96.3°F | Resp 18

## 2021-03-18 DIAGNOSIS — D61818 Other pancytopenia: Secondary | ICD-10-CM | POA: Insufficient documentation

## 2021-03-18 DIAGNOSIS — D469 Myelodysplastic syndrome, unspecified: Secondary | ICD-10-CM | POA: Diagnosis not present

## 2021-03-18 DIAGNOSIS — Z5111 Encounter for antineoplastic chemotherapy: Secondary | ICD-10-CM | POA: Diagnosis not present

## 2021-03-18 DIAGNOSIS — Z79899 Other long term (current) drug therapy: Secondary | ICD-10-CM | POA: Insufficient documentation

## 2021-03-18 DIAGNOSIS — N189 Chronic kidney disease, unspecified: Secondary | ICD-10-CM | POA: Insufficient documentation

## 2021-03-18 MED ORDER — HEPARIN SOD (PORK) LOCK FLUSH 100 UNIT/ML IV SOLN
500.0000 [IU] | Freq: Once | INTRAVENOUS | Status: AC | PRN
  Administered 2021-03-18: 500 [IU]

## 2021-03-18 MED ORDER — SODIUM CHLORIDE 0.9% FLUSH
10.0000 mL | INTRAVENOUS | Status: DC | PRN
  Administered 2021-03-18: 10 mL

## 2021-03-18 MED ORDER — SODIUM CHLORIDE 0.9 % IV SOLN: Freq: Once | INTRAVENOUS | Status: AC

## 2021-03-18 MED ORDER — SODIUM CHLORIDE 0.9 % IV SOLN
50.0000 mg/m2 | Freq: Once | INTRAVENOUS | Status: AC
  Administered 2021-03-18: 90 mg via INTRAVENOUS
  Filled 2021-03-18: qty 9

## 2021-03-18 MED ORDER — SODIUM CHLORIDE 0.9 % IV SOLN
10.0000 mg | Freq: Once | INTRAVENOUS | Status: AC
  Administered 2021-03-18: 10 mg via INTRAVENOUS
  Filled 2021-03-18: qty 10

## 2021-03-18 NOTE — Patient Instructions (Signed)
[place]  Discharge Instructions: Thank you for choosing [facility] to provide your oncology and hematology care.  If you have a lab appointment with the [facility], please come in thru the Main Entrance and check in at the main information desk.  Wear comfortable clothing and clothing appropriate for easy access to any Portacath or PICC line.   We strive to give you quality time with your provider. You may need to reschedule your appointment if you arrive late (15 or more minutes).  Arriving late affects you and other patients whose appointments are after yours.  Also, if you miss three or more appointments without notifying the office, you may be dismissed from the clinic at the provider's discretion.      For prescription refill requests, have your pharmacy contact our office and allow 72 hours for refills to be completed.    Today you received the following chemotherapy and/or immunotherapy agents vidaza      To help prevent nausea and vomiting after your treatment, we encourage you to take your nausea medication as directed.  BELOW ARE SYMPTOMS THAT SHOULD BE REPORTED IMMEDIATELY: *FEVER GREATER THAN 100.4 F (38 C) OR HIGHER *CHILLS OR SWEATING *NAUSEA AND VOMITING THAT IS NOT CONTROLLED WITH YOUR NAUSEA MEDICATION *UNUSUAL SHORTNESS OF BREATH *UNUSUAL BRUISING OR BLEEDING *URINARY PROBLEMS (pain or burning when urinating, or frequent urination) *BOWEL PROBLEMS (unusual diarrhea, constipation, pain near the anus) TENDERNESS IN MOUTH AND THROAT WITH OR WITHOUT PRESENCE OF ULCERS (sore throat, sores in mouth, or a toothache) UNUSUAL RASH, SWELLING OR PAIN  UNUSUAL VAGINAL DISCHARGE OR ITCHING   Items with * indicate a potential emergency and should be followed up as soon as possible or go to the Emergency Department if any problems should occur.  Please show the CHEMOTHERAPY ALERT CARD or IMMUNOTHERAPY ALERT CARD at check-in to the Emergency  Department and triage nurse.  Should you have questions after your visit or need to cancel or reschedule your appointment, please contact [facility] [phone]  and follow the prompts.  Office hours are 8:00 a.m. to 4:30 p.m. Monday - Friday. Please note that voicemails left after 4:00 p.m. may not be returned until the following business day.  We are closed weekends and major holidays. You have access to a nurse at all times for urgent questions. Please call the main number to the clinic [phone] and follow the prompts.  For any non-urgent questions, you may also contact your provider using MyChart. We now offer e-Visits for anyone 58 and older to request care online for non-urgent symptoms. For details visit [email or web address].   Also download the MyChart app! Go to the app store, search "MyChart", open the app, select [place], and log in with your MyChart username and password.  Due to Covid, a mask is required upon entering the hospital/clinic. If you do not have a mask, one will be given to you upon arrival. For doctor visits, patients may have 1 support person aged 58 or older with them. For treatment visits, patients cannot have anyone with them due to current Covid guidelines and our immunocompromised population.   Azacitidine suspension for injection (subcutaneous use) What is this medication? AZACITIDINE (ay [place]) is a chemotherapy drug. This medicine reduces the growth of cancer cells and can suppress the immune system. It is used for treating myelodysplastic syndrome or some types of leukemia. This medicine may be used for other purposes; ask your health care  provider or pharmacist if you have questions. COMMON BRAND NAME(S): Vidaza What should I tell my care team before I take this medication? They need to know if you have any of these conditions: kidney disease liver disease liver tumors an unusual or allergic reaction to azacitidine, mannitol,  other medicines, foods, dyes, or preservatives pregnant or trying to get pregnant breast-feeding How should I use this medication? This medicine is for injection under the skin. It is administered in a hospital or clinic by a specially trained health care professional. Talk to your pediatrician regarding the use of this medicine in children. While this drug may be prescribed for selected conditions, precautions do apply. Overdosage: If you think you have taken too much of this medicine contact a poison control center or emergency room at once. NOTE: This medicine is only for you. Do not share this medicine with others. What if I miss a dose? It is important not to miss your dose. Call your doctor or health care professional if you are unable to keep an appointment. What may interact with this medication? Interactions have not been studied. Give your health care provider a list of all the medicines, herbs, non-prescription drugs, or dietary supplements you use. Also tell them if you smoke, drink alcohol, or use illegal drugs. Some items may interact with your medicine. This list may not describe all possible interactions. Give your health care provider a list of all the medicines, herbs, non-prescription drugs, or dietary supplements you use. Also tell them if you smoke, drink alcohol, or use illegal drugs. Some items may interact with your medicine. What should I watch for while using this medication? Visit your doctor for checks on your progress. This drug may make you feel generally unwell. This is not uncommon, as chemotherapy can affect healthy cells as well as cancer cells. Report any side effects. Continue your course of treatment even though you feel ill unless your doctor tells you to stop. In some cases, you may be given additional medicines to help with side effects. Follow all directions for their use. Call your doctor or health care professional for advice if you get a fever, chills or  sore throat, or other symptoms of a cold or flu. Do not treat yourself. This drug decreases your body's ability to fight infections. Try to avoid being around people who are sick. This medicine may increase your risk to bruise or bleed. Call your doctor or health care professional if you notice any unusual bleeding. You may need blood work done while you are taking this medicine. Do not become pregnant while taking this medicine and for 6 months after the last dose. Women should inform their doctor if they wish to become pregnant or think they might be pregnant. Men should not father a child while taking this medicine and for 3 months after the last dose. There is a potential for serious side effects to an unborn child. Talk to your health care professional or pharmacist for more information. Do not breast-feed an infant while taking this medicine and for 1 week after the last dose. This medicine may interfere with the ability to have a child. Talk with your doctor or health care professional if you are concerned about your fertility. What side effects may I notice from receiving this medication? Side effects that you should report to your doctor or health care professional as soon as possible: allergic reactions like skin rash, itching or hives, swelling of the face, lips, or tongue low  blood counts - this medicine may decrease the number of white blood cells, red blood cells and platelets. You may be at increased risk for infections and bleeding. signs of infection - fever or chills, cough, sore throat, pain passing urine signs of decreased platelets or bleeding - bruising, pinpoint red spots on the skin, black, tarry stools, blood in the urine signs of decreased red blood cells - unusually weak or tired, fainting spells, lightheadedness signs and symptoms of kidney injury like trouble passing urine or change in the amount of urine signs and symptoms of liver injury like dark yellow or brown urine;  general ill feeling or flu-like symptoms; light-colored stools; loss of appetite; nausea; right upper belly pain; unusually weak or tired; yellowing of the eyes or skin Side effects that usually do not require medical attention (report to your doctor or health care professional if they continue or are bothersome): constipation diarrhea nausea, vomiting pain or redness at the injection site unusually weak or tired This list may not describe all possible side effects. Call your doctor for medical advice about side effects. You may report side effects to FDA at 1-800-FDA-1088. Where should I keep my medication? This drug is given in a hospital or clinic and will not be stored at home. NOTE: This sheet is a summary. It may not cover all possible information. If you have questions about this medicine, talk to your doctor, pharmacist, or health care provider.  2022 Elsevier/Gold Standard (2016-06-02 14:37:51)

## 2021-03-18 NOTE — Progress Notes (Signed)
Pt here for D2 of vidaza.  No complaints to note.  Vital signs WNL for treatment.   Tolerated treatment well today without incidence.  Stable during and after treatment.  AVS reviewed.  Discharged in stable condition via motorized wheelchair. Vital signs stable prior to discharge.

## 2021-03-19 ENCOUNTER — Encounter (HOSPITAL_COMMUNITY): Payer: Self-pay

## 2021-03-19 ENCOUNTER — Inpatient Hospital Stay (HOSPITAL_COMMUNITY): Payer: Medicare Other

## 2021-03-19 ENCOUNTER — Other Ambulatory Visit: Payer: Self-pay

## 2021-03-19 VITALS — BP 102/62 | HR 67 | Temp 96.4°F | Resp 20

## 2021-03-19 DIAGNOSIS — Z5111 Encounter for antineoplastic chemotherapy: Secondary | ICD-10-CM | POA: Diagnosis not present

## 2021-03-19 DIAGNOSIS — D469 Myelodysplastic syndrome, unspecified: Secondary | ICD-10-CM

## 2021-03-19 MED ORDER — PALONOSETRON HCL INJECTION 0.25 MG/5ML
0.2500 mg | Freq: Once | INTRAVENOUS | Status: AC
  Administered 2021-03-19: 0.25 mg via INTRAVENOUS
  Filled 2021-03-19: qty 5

## 2021-03-19 MED ORDER — SODIUM CHLORIDE 0.9 % IV SOLN: Freq: Once | INTRAVENOUS | Status: AC

## 2021-03-19 MED ORDER — SODIUM CHLORIDE 0.9 % IV SOLN
50.0000 mg/m2 | Freq: Once | INTRAVENOUS | Status: AC
  Administered 2021-03-19: 90 mg via INTRAVENOUS
  Filled 2021-03-19: qty 9

## 2021-03-19 MED ORDER — SODIUM CHLORIDE 0.9% FLUSH
10.0000 mL | INTRAVENOUS | Status: DC | PRN
  Administered 2021-03-19: 10 mL

## 2021-03-19 MED ORDER — HEPARIN SOD (PORK) LOCK FLUSH 100 UNIT/ML IV SOLN
500.0000 [IU] | Freq: Once | INTRAVENOUS | Status: AC | PRN
  Administered 2021-03-19: 500 [IU]

## 2021-03-19 MED ORDER — SODIUM CHLORIDE 0.9 % IV SOLN
10.0000 mg | Freq: Once | INTRAVENOUS | Status: AC
  Administered 2021-03-19: 10 mg via INTRAVENOUS
  Filled 2021-03-19: qty 10

## 2021-03-19 NOTE — Progress Notes (Signed)
D3 of vidaza, no complaints to note.  Vital signs WNL for treatment today.  Tolerated treatment well today without incidence.  Stable during and after treatment.  AVS reviewed.  Discharged in stable condition via motorized wheelchair.

## 2021-03-19 NOTE — Patient Instructions (Signed)
[name]  Discharge Instructions: Thank you for choosing [facility] to provide your oncology and hematology care.  If you have a lab appointment with the [facility], please come in thru the Main Entrance and check in at the main information desk.  Wear comfortable clothing and clothing appropriate for easy access to any Portacath or PICC line.   We strive to give you quality time with your provider. You may need to reschedule your appointment if you arrive late (15 or more minutes).  Arriving late affects you and other patients whose appointments are after yours.  Also, if you miss three or more appointments without notifying the office, you may be dismissed from the clinic at the provider's discretion.      For prescription refill requests, have your pharmacy contact our office and allow 72 hours for refills to be completed.    Today you received the following chemotherapy and/or immunotherapy agents vidaza      To help prevent nausea and vomiting after your treatment, we encourage you to take your nausea medication as directed.  BELOW ARE SYMPTOMS THAT SHOULD BE REPORTED IMMEDIATELY: *FEVER GREATER THAN 100.4 F (38 C) OR HIGHER *CHILLS OR SWEATING *NAUSEA AND VOMITING THAT IS NOT CONTROLLED WITH YOUR NAUSEA MEDICATION *UNUSUAL SHORTNESS OF BREATH *UNUSUAL BRUISING OR BLEEDING *URINARY PROBLEMS (pain or burning when urinating, or frequent urination) *BOWEL PROBLEMS (unusual diarrhea, constipation, pain near the anus) TENDERNESS IN MOUTH AND THROAT WITH OR WITHOUT PRESENCE OF ULCERS (sore throat, sores in mouth, or a toothache) UNUSUAL RASH, SWELLING OR PAIN  UNUSUAL VAGINAL DISCHARGE OR ITCHING   Items with * indicate a potential emergency and should be followed up as soon as possible or go to the Emergency Department if any problems should occur.  Please show the CHEMOTHERAPY ALERT CARD or IMMUNOTHERAPY ALERT CARD at check-in to the Emergency  Department and triage nurse.  Should you have questions after your visit or need to cancel or reschedule your appointment, please contact [facility] [phone]  and follow the prompts.  Office hours are 8:00 a.m. to 4:30 p.m. Monday - Friday. Please note that voicemails left after 4:00 p.m. may not be returned until the following business day.  We are closed weekends and major holidays. You have access to a nurse at all times for urgent questions. Please call the main number to the clinic [phone] and follow the prompts.  For any non-urgent questions, you may also contact your provider using MyChart. We now offer e-Visits for anyone 52 and older to request care online for non-urgent symptoms. For details visit [email or web address].   Also download the MyChart app! Go to the app store, search "MyChart", open the app, select [facility], and log in with your MyChart username and password.  Due to Covid, a mask is required upon entering the hospital/clinic. If you do not have a mask, one will be given to you upon arrival. For doctor visits, patients may have 1 support person aged 22 or older with them. For treatment visits, patients cannot have anyone with them due to current Covid guidelines and our immunocompromised population.    Azacitidine suspension for injection (subcutaneous use) What is this medication? AZACITIDINE (ay [place]) is a chemotherapy drug. This medicine reduces the growth of cancer cells and can suppress the immune system. It is used for treating myelodysplastic syndrome or some types of leukemia. This medicine may be used for other purposes; ask your health  care provider or pharmacist if you have questions. COMMON BRAND NAME(S): Vidaza What should I tell my care team before I take this medication? They need to know if you have any of these conditions: kidney disease liver disease liver tumors an unusual or allergic reaction to azacitidine,  mannitol, other medicines, foods, dyes, or preservatives pregnant or trying to get pregnant breast-feeding How should I use this medication? This medicine is for injection under the skin. It is administered in a hospital or clinic by a specially trained health care professional. Talk to your pediatrician regarding the use of this medicine in children. While this drug may be prescribed for selected conditions, precautions do apply. Overdosage: If you think you have taken too much of this medicine contact a poison control center or emergency room at once. NOTE: This medicine is only for you. Do not share this medicine with others. What if I miss a dose? It is important not to miss your dose. Call your doctor or health care professional if you are unable to keep an appointment. What may interact with this medication? Interactions have not been studied. Give your health care provider a list of all the medicines, herbs, non-prescription drugs, or dietary supplements you use. Also tell them if you smoke, drink alcohol, or use illegal drugs. Some items may interact with your medicine. This list may not describe all possible interactions. Give your health care provider a list of all the medicines, herbs, non-prescription drugs, or dietary supplements you use. Also tell them if you smoke, drink alcohol, or use illegal drugs. Some items may interact with your medicine. What should I watch for while using this medication? Visit your doctor for checks on your progress. This drug may make you feel generally unwell. This is not uncommon, as chemotherapy can affect healthy cells as well as cancer cells. Report any side effects. Continue your course of treatment even though you feel ill unless your doctor tells you to stop. In some cases, you may be given additional medicines to help with side effects. Follow all directions for their use. Call your doctor or health care professional for advice if you get a fever,  chills or sore throat, or other symptoms of a cold or flu. Do not treat yourself. This drug decreases your body's ability to fight infections. Try to avoid being around people who are sick. This medicine may increase your risk to bruise or bleed. Call your doctor or health care professional if you notice any unusual bleeding. You may need blood work done while you are taking this medicine. Do not become pregnant while taking this medicine and for 6 months after the last dose. Women should inform their doctor if they wish to become pregnant or think they might be pregnant. Men should not father a child while taking this medicine and for 3 months after the last dose. There is a potential for serious side effects to an unborn child. Talk to your health care professional or pharmacist for more information. Do not breast-feed an infant while taking this medicine and for 1 week after the last dose. This medicine may interfere with the ability to have a child. Talk with your doctor or health care professional if you are concerned about your fertility. What side effects may I notice from receiving this medication? Side effects that you should report to your doctor or health care professional as soon as possible: allergic reactions like skin rash, itching or hives, swelling of the face, lips, or tongue  low blood counts - this medicine may decrease the number of white blood cells, red blood cells and platelets. You may be at increased risk for infections and bleeding. signs of infection - fever or chills, cough, sore throat, pain passing urine signs of decreased platelets or bleeding - bruising, pinpoint red spots on the skin, black, tarry stools, blood in the urine signs of decreased red blood cells - unusually weak or tired, fainting spells, lightheadedness signs and symptoms of kidney injury like trouble passing urine or change in the amount of urine signs and symptoms of liver injury like dark yellow or brown  urine; general ill feeling or flu-like symptoms; light-colored stools; loss of appetite; nausea; right upper belly pain; unusually weak or tired; yellowing of the eyes or skin Side effects that usually do not require medical attention (report to your doctor or health care professional if they continue or are bothersome): constipation diarrhea nausea, vomiting pain or redness at the injection site unusually weak or tired This list may not describe all possible side effects. Call your doctor for medical advice about side effects. You may report side effects to FDA at 1-800-FDA-1088. Where should I keep my medication? This drug is given in a hospital or clinic and will not be stored at home. NOTE: This sheet is a summary. It may not cover all possible information. If you have questions about this medicine, talk to your doctor, pharmacist, or health care provider.  2022 Elsevier/Gold Standard (2016-06-02 14:37:51)

## 2021-03-20 ENCOUNTER — Inpatient Hospital Stay (HOSPITAL_COMMUNITY): Payer: Medicare Other | Admitting: Dietician

## 2021-03-20 ENCOUNTER — Inpatient Hospital Stay (HOSPITAL_COMMUNITY): Payer: Medicare Other

## 2021-03-20 ENCOUNTER — Other Ambulatory Visit: Payer: Self-pay

## 2021-03-20 VITALS — BP 95/61 | HR 65 | Temp 97.4°F | Resp 18

## 2021-03-20 DIAGNOSIS — Z5111 Encounter for antineoplastic chemotherapy: Secondary | ICD-10-CM | POA: Diagnosis not present

## 2021-03-20 DIAGNOSIS — D469 Myelodysplastic syndrome, unspecified: Secondary | ICD-10-CM

## 2021-03-20 MED ORDER — SODIUM CHLORIDE 0.9 % IV SOLN
50.0000 mg/m2 | Freq: Once | INTRAVENOUS | Status: AC
  Administered 2021-03-20: 90 mg via INTRAVENOUS
  Filled 2021-03-20: qty 9

## 2021-03-20 MED ORDER — SODIUM CHLORIDE 0.9% FLUSH
10.0000 mL | INTRAVENOUS | Status: DC | PRN
  Administered 2021-03-20: 10 mL

## 2021-03-20 MED ORDER — SODIUM CHLORIDE 0.9 % IV SOLN
10.0000 mg | Freq: Once | INTRAVENOUS | Status: AC
  Administered 2021-03-20: 10 mg via INTRAVENOUS
  Filled 2021-03-20: qty 10

## 2021-03-20 MED ORDER — HEPARIN SOD (PORK) LOCK FLUSH 100 UNIT/ML IV SOLN
500.0000 [IU] | Freq: Once | INTRAVENOUS | Status: AC | PRN
  Administered 2021-03-20: 500 [IU]

## 2021-03-20 MED ORDER — SODIUM CHLORIDE 0.9 % IV SOLN: Freq: Once | INTRAVENOUS | Status: AC

## 2021-03-20 NOTE — Progress Notes (Signed)
Nutrition Follow-up:  Patient with myelodysplastic syndrome. He is receiving Azacitidine.   Met with patient during infusion. He reports appetite is about the same, he has been snacking on candy lately. Patient reports appetite stimulant "did nothing for him" and stopped taking Megace ~5 days ago. He has switched to Ensure Complete (350 kcal, 30 grams protein), drinks 2 daily. Every couple of days he will make a milkshake mixed with Ensure. Yesterday patient had a donut with coffee for breakfast, BBQ sandwich for lunch, 6 oz filet, mashed potatoes, greens for dinner.   Medications: Megace - pt reports he stopped taking   Labs: 10/31 - BUN 31, Cr 1.88  Anthropometrics: Last weight 123 lb 12.8 oz on 10/31 stable   10/17 - 124 lb 10/3 - 123 lb 12.8 oz 10/1 - 122 lb 9/26 - 122 lb 4.8 oz  NUTRITION DIAGNOSIS: Unintentional weight loss stable   INTERVENTION:  Continue drinking Ensure Complete twice daily - pt declined coupons Encouraged high calorie, high protein snacks in between meals and at bedtime Patient has contact information    MONITORING, EVALUATION, GOAL: weight trends, intake   NEXT VISIT: Thursday December 1 during infusion

## 2021-03-20 NOTE — Patient Instructions (Signed)
[facility]  Discharge Instructions: Thank you for choosing [facility] to provide your oncology and hematology care.  If you have a lab appointment with the Cancer Center, please come in thru the Main Entrance and check in at the main information desk.  Wear comfortable clothing and clothing appropriate for easy access to any Portacath or PICC line.   We strive to give you quality time with your provider. You may need to reschedule your appointment if you arrive late (15 or more minutes).  Arriving late affects you and other patients whose appointments are after yours.  Also, if you miss three or more appointments without notifying the office, you may be dismissed from the clinic at the provider's discretion.      For prescription refill requests, have your pharmacy contact our office and allow 72 hours for refills to be completed.        To help prevent nausea and vomiting after your treatment, we encourage you to take your nausea medication as directed.  BELOW ARE SYMPTOMS THAT SHOULD BE REPORTED IMMEDIATELY: *FEVER GREATER THAN 100.4 F (38 C) OR HIGHER *CHILLS OR SWEATING *NAUSEA AND VOMITING THAT IS NOT CONTROLLED WITH YOUR NAUSEA MEDICATION *UNUSUAL SHORTNESS OF BREATH *UNUSUAL BRUISING OR BLEEDING *URINARY PROBLEMS (pain or burning when urinating, or frequent urination) *BOWEL PROBLEMS (unusual diarrhea, constipation, pain near the anus) TENDERNESS IN MOUTH AND THROAT WITH OR WITHOUT PRESENCE OF ULCERS (sore throat, sores in mouth, or a toothache) UNUSUAL RASH, SWELLING OR PAIN  UNUSUAL VAGINAL DISCHARGE OR ITCHING   Items with * indicate a potential emergency and should be followed up as soon as possible or go to the Emergency Department if any problems should occur.  Please show the CHEMOTHERAPY ALERT CARD or IMMUNOTHERAPY ALERT CARD at check-in to the Emergency Department and triage nurse.  Should you have questions after your visit or need to cancel  or reschedule your appointment, please contact [facility] [phone]  and follow the prompts.  Office hours are 8:00 a.m. to 4:30 p.m. Monday - Friday. Please note that voicemails left after 4:00 p.m. may not be returned until the following business day.  We are closed weekends and major holidays. You have access to a nurse at all times for urgent questions. Please call the main number to the clinic [phone] and follow the prompts.  For any non-urgent questions, you may also contact your provider using MyChart. We now offer e-Visits for anyone 18 and older to request care online for non-urgent symptoms. For details visit [email or web address].   Also download the MyChart app! Go to the app store, search "MyChart", open the app, select [facility], and log in with your MyChart username and password.  Due to Covid, a mask is required upon entering the hospital/clinic. If you do not have a mask, one will be given to you upon arrival. For doctor visits, patients may have 1 support person aged 18 or older with them. For treatment visits, patients cannot have anyone with them due to current Covid guidelines and our immunocompromised population.  

## 2021-03-20 NOTE — Progress Notes (Signed)
Patient presents today for Vidaza chemotherapy infusion.  Patient is in satisfactory condition with no new complaints voiced.  Vital signs are stable.  We will proceed with treatment per MD orders.   Patient tolerated treatment well with no complaints voiced.  Patient left via motorized wheelchair in stable condition.  Vital signs stable at discharge.  Follow up as scheduled.

## 2021-03-21 ENCOUNTER — Inpatient Hospital Stay (HOSPITAL_COMMUNITY): Payer: Medicare Other

## 2021-03-21 VITALS — BP 101/52 | HR 65 | Temp 96.2°F | Resp 18

## 2021-03-21 DIAGNOSIS — D469 Myelodysplastic syndrome, unspecified: Secondary | ICD-10-CM

## 2021-03-21 DIAGNOSIS — Z5111 Encounter for antineoplastic chemotherapy: Secondary | ICD-10-CM | POA: Diagnosis not present

## 2021-03-21 MED ORDER — SODIUM CHLORIDE 0.9 % IV SOLN: Freq: Once | INTRAVENOUS | Status: AC

## 2021-03-21 MED ORDER — SODIUM CHLORIDE 0.9 % IV SOLN
10.0000 mg | Freq: Once | INTRAVENOUS | Status: AC
  Administered 2021-03-21: 10 mg via INTRAVENOUS
  Filled 2021-03-21: qty 10

## 2021-03-21 MED ORDER — PALONOSETRON HCL INJECTION 0.25 MG/5ML
0.2500 mg | Freq: Once | INTRAVENOUS | Status: AC
  Administered 2021-03-21: 0.25 mg via INTRAVENOUS
  Filled 2021-03-21: qty 5

## 2021-03-21 MED ORDER — SODIUM CHLORIDE 0.9 % IV SOLN
50.0000 mg/m2 | Freq: Once | INTRAVENOUS | Status: AC
  Administered 2021-03-21: 90 mg via INTRAVENOUS
  Filled 2021-03-21: qty 9

## 2021-03-21 MED ORDER — HEPARIN SOD (PORK) LOCK FLUSH 100 UNIT/ML IV SOLN
500.0000 [IU] | Freq: Once | INTRAVENOUS | Status: AC | PRN
  Administered 2021-03-21: 500 [IU]

## 2021-03-21 MED ORDER — SODIUM CHLORIDE 0.9% FLUSH
10.0000 mL | INTRAVENOUS | Status: DC | PRN
  Administered 2021-03-21: 10 mL

## 2021-03-21 NOTE — Patient Instructions (Signed)
[facility]  Discharge Instructions: Thank you for choosing [facility] to provide your oncology and hematology care.  If you have a lab appointment with the Cancer Center, please come in thru the Main Entrance and check in at the main information desk.  Wear comfortable clothing and clothing appropriate for easy access to any Portacath or PICC line.   We strive to give you quality time with your provider. You may need to reschedule your appointment if you arrive late (15 or more minutes).  Arriving late affects you and other patients whose appointments are after yours.  Also, if you miss three or more appointments without notifying the office, you may be dismissed from the clinic at the provider's discretion.      For prescription refill requests, have your pharmacy contact our office and allow 72 hours for refills to be completed.        To help prevent nausea and vomiting after your treatment, we encourage you to take your nausea medication as directed.  BELOW ARE SYMPTOMS THAT SHOULD BE REPORTED IMMEDIATELY: *FEVER GREATER THAN 100.4 F (38 C) OR HIGHER *CHILLS OR SWEATING *NAUSEA AND VOMITING THAT IS NOT CONTROLLED WITH YOUR NAUSEA MEDICATION *UNUSUAL SHORTNESS OF BREATH *UNUSUAL BRUISING OR BLEEDING *URINARY PROBLEMS (pain or burning when urinating, or frequent urination) *BOWEL PROBLEMS (unusual diarrhea, constipation, pain near the anus) TENDERNESS IN MOUTH AND THROAT WITH OR WITHOUT PRESENCE OF ULCERS (sore throat, sores in mouth, or a toothache) UNUSUAL RASH, SWELLING OR PAIN  UNUSUAL VAGINAL DISCHARGE OR ITCHING   Items with * indicate a potential emergency and should be followed up as soon as possible or go to the Emergency Department if any problems should occur.  Please show the CHEMOTHERAPY ALERT CARD or IMMUNOTHERAPY ALERT CARD at check-in to the Emergency Department and triage nurse.  Should you have questions after your visit or need to cancel  or reschedule your appointment, please contact [facility] [phone]  and follow the prompts.  Office hours are 8:00 a.m. to 4:30 p.m. Monday - Friday. Please note that voicemails left after 4:00 p.m. may not be returned until the following business day.  We are closed weekends and major holidays. You have access to a nurse at all times for urgent questions. Please call the main number to the clinic [phone] and follow the prompts.  For any non-urgent questions, you may also contact your provider using MyChart. We now offer e-Visits for anyone 18 and older to request care online for non-urgent symptoms. For details visit [email or web address].   Also download the MyChart app! Go to the app store, search "MyChart", open the app, select [facility], and log in with your MyChart username and password.  Due to Covid, a mask is required upon entering the hospital/clinic. If you do not have a mask, one will be given to you upon arrival. For doctor visits, patients may have 1 support person aged 18 or older with them. For treatment visits, patients cannot have anyone with them due to current Covid guidelines and our immunocompromised population.  

## 2021-03-21 NOTE — Progress Notes (Signed)
Patient presents today for chemotherapy infusion.  Patient is in satisfactory condition with no complaints voiced.  Vital signs are stable.  We will proceed with treatment per MD orders.  ? ?Patient tolerated treatment well with no complaints voiced.  Patient left via motorized wheelchair in stable condition.  Vital signs stable at discharge.  Follow up as scheduled.    ?

## 2021-03-24 ENCOUNTER — Inpatient Hospital Stay (HOSPITAL_COMMUNITY): Payer: Medicare Other

## 2021-03-24 ENCOUNTER — Other Ambulatory Visit (HOSPITAL_COMMUNITY): Payer: Self-pay

## 2021-03-24 ENCOUNTER — Other Ambulatory Visit: Payer: Self-pay

## 2021-03-24 VITALS — BP 106/66 | HR 69 | Temp 97.2°F | Resp 18

## 2021-03-24 DIAGNOSIS — Z5111 Encounter for antineoplastic chemotherapy: Secondary | ICD-10-CM | POA: Diagnosis not present

## 2021-03-24 DIAGNOSIS — D649 Anemia, unspecified: Secondary | ICD-10-CM

## 2021-03-24 DIAGNOSIS — D469 Myelodysplastic syndrome, unspecified: Secondary | ICD-10-CM

## 2021-03-24 LAB — COMPREHENSIVE METABOLIC PANEL
ALT: 13 U/L (ref 0–44)
AST: 15 U/L (ref 15–41)
Albumin: 3.3 g/dL — ABNORMAL LOW (ref 3.5–5.0)
Alkaline Phosphatase: 68 U/L (ref 38–126)
Anion gap: 6 (ref 5–15)
BUN: 36 mg/dL — ABNORMAL HIGH (ref 8–23)
CO2: 24 mmol/L (ref 22–32)
Calcium: 8.9 mg/dL (ref 8.9–10.3)
Chloride: 106 mmol/L (ref 98–111)
Creatinine, Ser: 1.74 mg/dL — ABNORMAL HIGH (ref 0.61–1.24)
GFR, Estimated: 37 mL/min — ABNORMAL LOW (ref >60)
Glucose, Bld: 107 mg/dL — ABNORMAL HIGH (ref 70–99)
Potassium: 3.6 mmol/L (ref 3.5–5.1)
Sodium: 136 mmol/L (ref 135–145)
Total Bilirubin: 0.8 mg/dL (ref 0.3–1.2)
Total Protein: 6.7 g/dL (ref 6.5–8.1)

## 2021-03-24 LAB — CBC WITH DIFFERENTIAL/PLATELET
Basophils Absolute: 0 10*3/uL (ref 0.0–0.1)
Basophils Relative: 0 %
Eosinophils Absolute: 0.1 10*3/uL (ref 0.0–0.5)
Eosinophils Relative: 1 %
HCT: 25.7 % — ABNORMAL LOW (ref 39.0–52.0)
Hemoglobin: 7.7 g/dL — ABNORMAL LOW (ref 13.0–17.0)
Lymphocytes Relative: 10 %
Lymphs Abs: 0.6 10*3/uL — ABNORMAL LOW (ref 0.7–4.0)
MCH: 24.8 pg — ABNORMAL LOW (ref 26.0–34.0)
MCHC: 30 g/dL (ref 30.0–36.0)
MCV: 82.6 fL (ref 80.0–100.0)
Monocytes Absolute: 0.2 10*3/uL (ref 0.1–1.0)
Monocytes Relative: 3 %
Neutro Abs: 5.5 10*3/uL (ref 1.7–7.7)
Neutrophils Relative %: 86 %
Platelets: 86 10*3/uL — ABNORMAL LOW (ref 150–400)
RBC: 3.11 MIL/uL — ABNORMAL LOW (ref 4.22–5.81)
RDW: 19.2 % — ABNORMAL HIGH (ref 11.5–15.5)
WBC: 6.4 10*3/uL (ref 4.0–10.5)
nRBC: 0.8 % — ABNORMAL HIGH (ref 0.0–0.2)

## 2021-03-24 LAB — SAMPLE TO BLOOD BANK

## 2021-03-24 LAB — PREPARE RBC (CROSSMATCH)

## 2021-03-24 LAB — LACTATE DEHYDROGENASE: LDH: 133 U/L (ref 98–192)

## 2021-03-24 MED ORDER — HEPARIN SOD (PORK) LOCK FLUSH 100 UNIT/ML IV SOLN
500.0000 [IU] | Freq: Every day | INTRAVENOUS | Status: AC | PRN
  Administered 2021-03-24: 500 [IU]

## 2021-03-24 MED ORDER — SODIUM CHLORIDE 0.9% IV SOLUTION
250.0000 mL | Freq: Once | INTRAVENOUS | Status: AC
  Administered 2021-03-24: 250 mL via INTRAVENOUS

## 2021-03-24 MED ORDER — SODIUM CHLORIDE 0.9% FLUSH
10.0000 mL | INTRAVENOUS | Status: AC | PRN
  Administered 2021-03-24: 10 mL

## 2021-03-24 MED ORDER — ACETAMINOPHEN 325 MG PO TABS
650.0000 mg | ORAL_TABLET | Freq: Once | ORAL | Status: AC
  Administered 2021-03-24: 650 mg via ORAL
  Filled 2021-03-24: qty 2

## 2021-03-24 MED ORDER — DIPHENHYDRAMINE HCL 25 MG PO CAPS
25.0000 mg | ORAL_CAPSULE | Freq: Once | ORAL | Status: AC
  Administered 2021-03-24: 25 mg via ORAL
  Filled 2021-03-24: qty 1

## 2021-03-24 NOTE — Progress Notes (Signed)
Pt presents today for possible blood per provider's order.Vital signs stable and pt c/o fatigue,weakness, and increased shortness of breath. Pt stated he hasn't felt good all weekend. Message sent to R. Pennington-PA and she stated to give 1 unit of blood.   1 unit of blood given today per MD orders. Tolerated infusion without adverse affects. Vital signs stable. No complaints at this time. Discharged from clinic via motorized chair in stable condition. Alert and oriented x 3. F/U with [facility] as scheduled.

## 2021-03-24 NOTE — Patient Instructions (Signed)
[facility]  Discharge Instructions: Thank you for choosing [facility] to provide your oncology and hematology care.  If you have a lab appointment with the [facility], please come in thru the Main Entrance and check in at the main information desk.  Wear comfortable clothing and clothing appropriate for easy access to any Portacath or PICC line.   We strive to give you quality time with your provider. You may need to reschedule your appointment if you arrive late (15 or more minutes).  Arriving late affects you and other patients whose appointments are after yours.  Also, if you miss three or more appointments without notifying the office, you may be dismissed from the clinic at the provider's discretion.      For prescription refill requests, have your pharmacy contact our office and allow 72 hours for refills to be completed.    Today you received the following chemotherapy and/or immunotherapy agents 1 unit of blood.    BELOW ARE SYMPTOMS THAT SHOULD BE REPORTED IMMEDIATELY: *FEVER GREATER THAN 100.4 F (38 C) OR HIGHER *CHILLS OR SWEATING *NAUSEA AND VOMITING THAT IS NOT CONTROLLED WITH YOUR NAUSEA MEDICATION *UNUSUAL SHORTNESS OF BREATH *UNUSUAL BRUISING OR BLEEDING *URINARY PROBLEMS (pain or burning when urinating, or frequent urination) *BOWEL PROBLEMS (unusual diarrhea, constipation, pain near the anus) TENDERNESS IN MOUTH AND THROAT WITH OR WITHOUT PRESENCE OF ULCERS (sore throat, sores in mouth, or a toothache) UNUSUAL RASH, SWELLING OR PAIN  UNUSUAL VAGINAL DISCHARGE OR ITCHING   Items with * indicate a potential emergency and should be followed up as soon as possible or go to the Emergency Department if any problems should occur.  Please show the CHEMOTHERAPY ALERT CARD or IMMUNOTHERAPY ALERT CARD at check-in to the Emergency Department and triage nurse.  Should you have questions after your visit or need to cancel or reschedule your  appointment, please contact [facility] [phone]  and follow the prompts.  Office hours are 8:00 a.m. to 4:30 p.m. Monday - Friday. Please note that voicemails left after 4:00 p.m. may not be returned until the following business day.  We are closed weekends and major holidays. You have access to a nurse at all times for urgent questions. Please call the main number to the clinic [phone] and follow the prompts.  For any non-urgent questions, you may also contact your provider using MyChart. We now offer e-Visits for anyone 22 and older to request care online for non-urgent symptoms. For details visit [email or web address].   Also download the MyChart app! Go to the app store, search "MyChart", open the app, select [facility], and log in with your MyChart username and password.  Due to Covid, a mask is required upon entering the hospital/clinic. If you do not have a mask, one will be given to you upon arrival. For doctor visits, patients may have 1 support person aged 50 or older with them. For treatment visits, patients cannot have anyone with them due to current Covid guidelines and our immunocompromised population.

## 2021-03-25 LAB — BPAM RBC
Blood Product Expiration Date: 202211182359
ISSUE DATE / TIME: 202211071028
Unit Type and Rh: 9500

## 2021-03-25 LAB — TYPE AND SCREEN
ABO/RH(D): B NEG
Antibody Screen: NEGATIVE
Unit division: 0

## 2021-03-26 ENCOUNTER — Encounter: Payer: Self-pay | Admitting: Urology

## 2021-03-26 ENCOUNTER — Other Ambulatory Visit: Payer: Self-pay

## 2021-03-26 ENCOUNTER — Ambulatory Visit (INDEPENDENT_AMBULATORY_CARE_PROVIDER_SITE_OTHER): Payer: Medicare Other | Admitting: Urology

## 2021-03-26 VITALS — BP 98/63 | HR 93

## 2021-03-26 DIAGNOSIS — R350 Frequency of micturition: Secondary | ICD-10-CM | POA: Diagnosis not present

## 2021-03-26 DIAGNOSIS — N138 Other obstructive and reflux uropathy: Secondary | ICD-10-CM

## 2021-03-26 DIAGNOSIS — N3000 Acute cystitis without hematuria: Secondary | ICD-10-CM | POA: Diagnosis not present

## 2021-03-26 DIAGNOSIS — N401 Enlarged prostate with lower urinary tract symptoms: Secondary | ICD-10-CM | POA: Diagnosis not present

## 2021-03-26 LAB — MICROSCOPIC EXAMINATION: Renal Epithel, UA: NONE SEEN /hpf

## 2021-03-26 LAB — URINALYSIS, ROUTINE W REFLEX MICROSCOPIC
Bilirubin, UA: NEGATIVE
Glucose, UA: NEGATIVE
Ketones, UA: NEGATIVE
Nitrite, UA: NEGATIVE
Specific Gravity, UA: 1.02 (ref 1.005–1.030)
Urobilinogen, Ur: 1 mg/dL (ref 0.2–1.0)
pH, UA: 6 (ref 5.0–7.5)

## 2021-03-26 MED ORDER — NITROFURANTOIN MONOHYD MACRO 100 MG PO CAPS: 100.0000 mg | ORAL_CAPSULE | Freq: Two times a day (BID) | ORAL | 0 refills | Status: DC

## 2021-03-26 MED ORDER — SILODOSIN 8 MG PO CAPS: 8.0000 mg | ORAL_CAPSULE | Freq: Every day | ORAL | 11 refills | Status: DC

## 2021-03-26 NOTE — Progress Notes (Signed)
03/26/2021 2:08 PM   [name] 14-Nov-1929 [id number]  Referring provider: Sharilyn Sites, MD [address] [place],  [place] [address]  Followup BPH   HPI: [name] is a 87yo here for followup for BPh with nocturia. IPSS 27 QOL 6. Nocturia 5-8x. Urine stream weak. PVR 125cc. He has urinary hesitancy and starting/stopping of his urinary stream. He was previously given rx for uroxatral which failed to improve his nocturia. He has intermittent dysuria. UA today is concerning for infection   PMH: Past Medical History:  Diagnosis Date   Bone cancer ([facility])    Chronic kidney insufficiency    COPD (chronic obstructive pulmonary disease) (HCC)    GERD (gastroesophageal reflux disease)    Pulmonary nodule     Surgical History: Past Surgical History:  Procedure Laterality Date   BACK SURGERY     CATARACT EXTRACTION W/PHACO Right 03/09/2016   Procedure: CATARACT EXTRACTION PHACO AND INTRAOCULAR LENS PLACEMENT RIGHT EYE CDE=8.58;  Surgeon: Tonny Branch, MD;  Location: [facility];  Service: Ophthalmology;  Laterality: Right;  right   CATARACT EXTRACTION W/PHACO [name] 04/13/2016   Procedure: CATARACT EXTRACTION PHACO AND INTRAOCULAR LENS PLACEMENT (IOC);  Surgeon: Tonny Branch, MD;  Location: [facility];  Service: Ophthalmology;  Laterality: [name];  CDE: 8.08   KYPHOPLASTY Bilateral 06/09/2019   Procedure: T11 KYPHOPLASTY;  Surgeon: Consuella Lose, MD;  Location: [facility];  Service: Neurosurgery;  Laterality: Bilateral;   None to Date  10/01/15   PORTACATH PLACEMENT [name] 10/11/2020   Procedure: INSERTION PORT-A-CATH (attached catheter in [name] internal jugular);  Surgeon: Virl Cagey, MD;  Location: [facility];  Service: General;  Laterality: [name];    Home Medications:  Allergies as of 03/26/2021   No Known Allergies      Medication List        Accurate as of March 26, 2021  2:08 PM. If you have any questions, ask your nurse or doctor.          acetaminophen 325 MG  tablet Commonly known as: TYLENOL Take 2 tablets (650 mg total) by mouth every 6 (six) hours as needed for mild pain, fever or headache (or Fever >/= 101).   albuterol 108 (90 Base) MCG/ACT inhaler Commonly known as: VENTOLIN HFA Inhale 2 puffs into the lungs every 4 (four) hours as needed for wheezing or shortness of breath.   cefdinir 300 MG capsule Commonly known as: OMNICEF Take 1 capsule (300 mg total) by mouth daily. For UTI   megestrol 400 MG/10ML suspension Commonly known as: MEGACE Take 10 mLs (400 mg total) by mouth 2 (two) times daily.   ondansetron 4 MG tablet Commonly known as: ZOFRAN Take 1 tablet (4 mg total) by mouth every 6 (six) hours as needed for nausea or vomiting.   pantoprazole 40 MG tablet Commonly known as: PROTONIX Take 40 mg by mouth daily. Pt no longer taking   predniSONE 10 MG tablet Commonly known as: DELTASONE Take 10 mg by mouth daily with breakfast.   prochlorperazine 10 MG tablet Commonly known as: COMPAZINE Take 1 tablet (10 mg total) by mouth every 6 (six) hours as needed for nausea or vomiting.   Spiriva HandiHaler 18 MCG inhalation capsule Generic drug: tiotropium Place 1 capsule (18 mcg total) into inhaler and inhale daily.        Allergies: No Known Allergies  Family History: Family History  Problem Relation Age of Onset   Kidney disease Mother    Kidney disease Sister  Colon cancer Neg Hx    Gastric cancer Neg Hx    Esophageal cancer Neg Hx     Social History:  reports that he quit smoking about 30 years ago. His smoking use included cigarettes and cigars. He has never used smokeless tobacco. He reports that he does not currently use alcohol. He reports that he does not use drugs.  ROS: All other review of systems were reviewed and are negative except what is noted above in HPI  Physical Exam: BP 98/63   Pulse 93   Constitutional:  Alert and oriented, No acute distress. HEENT: [place] AT, moist mucus membranes.   Trachea midline, no masses. Cardiovascular: No clubbing, cyanosis, or edema. Respiratory: Normal respiratory effort, no increased work of breathing. GI: Abdomen is soft, nontender, nondistended, no abdominal masses GU: No CVA tenderness.  Lymph: No cervical or inguinal lymphadenopathy. Skin: No rashes, bruises or suspicious lesions. Neurologic: Grossly intact, no focal deficits, moving all 4 extremities. Psychiatric: Normal mood and affect.  Laboratory Data: Lab Results  Component Value Date   WBC 6.4 03/24/2021   HGB 7.7 (L) 03/24/2021   HCT 25.7 (L) 03/24/2021   MCV 82.6 03/24/2021   PLT 86 (L) 03/24/2021    Lab Results  Component Value Date   CREATININE 1.74 (H) 03/24/2021    No results found for: PSA  No results found for: TESTOSTERONE  No results found for: HGBA1C  Urinalysis    Component Value Date/Time   COLORURINE YELLOW 02/24/2021 0919   APPEARANCEUR CLEAR 02/24/2021 0919   APPEARANCEUR Hazy (A) 02/26/2020 0910   LABSPEC 1.020 02/24/2021 0919   PHURINE 6.0 02/24/2021 0919   GLUCOSEU NEGATIVE 02/24/2021 0919   HGBUR SMALL (A) 02/24/2021 0919   BILIRUBINUR NEGATIVE 02/24/2021 0919   BILIRUBINUR Negative 02/26/2020 0910   KETONESUR NEGATIVE 02/24/2021 0919   PROTEINUR 30 (A) 02/24/2021 0919   UROBILINOGEN 0.2 08/15/2013 1240   NITRITE NEGATIVE 02/24/2021 0919   LEUKOCYTESUR MODERATE (A) 02/24/2021 0919    Lab Results  Component Value Date   LABMICR See below: 02/26/2020   WBCUA >30 (A) 02/26/2020   LABEPIT None seen 02/26/2020   BACTERIA NONE SEEN 02/24/2021    Pertinent Imaging:  Results for orders placed during the hospital encounter of 03/03/05  DG Abd 1 View  Narrative History: Urosepsis  ABDOMEN ONE VIEW:  No prior exam for comparison.  Normal bowel gas pattern. No urinary tract calcification. Minimal atherosclerotic calcification. Compression deformity L3 vertebral body. Mild diffuse bony demineralization.  IMPRESSION: No acute  abnormalities. Age-indeterminate compression deformity L3.  Provider: Lyda Jester  No results found for this or any previous visit.  No results found for this or any previous visit.  No results found for this or any previous visit.  Results for orders placed during the hospital encounter of 11/08/15  US Renal  Narrative CLINICAL DATA:  Stage 3 chronic kidney disease.  EXAM: RENAL / URINARY TRACT ULTRASOUND COMPLETE  COMPARISON:  03/05/2005 abdominal sonogram. 02/03/2010 CT abdomen/pelvis.  FINDINGS: Right Kidney:  Length: 9.2 cm. Slightly small right kidney. Mildly echogenic right kidney. No right hydronephrosis. Exophytic simple 3.6 x 3.3 x 3.3 cm renal cyst in the lower right kidney. Simple appearing 1.2 x 1.2 x 1.6 cm renal cyst in the upper right kidney.  [name] Kidney:  Length: 9.8 cm. Slightly small [name] kidney. Mildly echogenic [name] kidney. No [name] hydronephrosis. Exophytic simple 2.7 x 2.4 x 2.6 cm renal cyst in the lower [name] kidney. Additional simple appearing 1.7 cm  and 1.3 cm renal cysts in the mid to upper [name] kidney.  Bladder:  Minimally distended and grossly normal.  IMPRESSION: 1. Small echogenic kidneys, consistent with the provided history of chronic kidney disease. 2. No hydronephrosis. 3. Simple renal cysts in both kidneys. 4. Minimally distended and grossly normal bladder.   Electronically Signed By: Ilona Sorrel M.D. On: 11/08/2015 15:58  No results found for this or any previous visit.  No results found for this or any previous visit.  No results found for this or any previous visit.   Assessment & Plan:    1. Benign prostatic hyperplasia with urinary obstruction -rapaflo 8mg  daily - Urinalysis, Routine w reflex microscopic - BLADDER SCAN AMB NON-IMAGING  2. Nocturia -rapaflo 8mg  daily   3. Acute cystitis -Urine for culture -Macrobid 100mg  BID for 7 days   No follow-ups on file.  Nicolette Bang, MD  [facility]  [facility] [place]

## 2021-03-26 NOTE — Patient Instructions (Signed)

## 2021-03-26 NOTE — Progress Notes (Signed)
post void residual=125  Urological Symptom Review  Patient is experiencing the following symptoms: Frequent urination Get up at night to urinate Stream starts and stops Weak stream   Review of Systems  Gastrointestinal (upper)  : Negative for upper GI symptoms  Gastrointestinal (lower) : Negative for lower GI symptoms  Constitutional : Negative for symptoms  Skin: Negative for skin symptoms  Eyes: Negative for eye symptoms  Ear/Nose/Throat : Negative for Ear/Nose/Throat symptoms  Hematologic/Lymphatic: Negative for Hematologic/Lymphatic symptoms  Cardiovascular : Negative for cardiovascular symptoms  Respiratory : Negative for respiratory symptoms  Endocrine: Negative for endocrine symptoms  Musculoskeletal: Negative for musculoskeletal symptoms  Neurological: Negative for neurological symptoms  Psychologic: Negative for psychiatric symptoms

## 2021-03-27 ENCOUNTER — Other Ambulatory Visit (HOSPITAL_COMMUNITY): Payer: Self-pay

## 2021-03-30 LAB — URINE CULTURE

## 2021-03-31 ENCOUNTER — Other Ambulatory Visit (HOSPITAL_COMMUNITY): Payer: Medicare Other

## 2021-03-31 ENCOUNTER — Encounter (HOSPITAL_COMMUNITY): Payer: Self-pay

## 2021-03-31 ENCOUNTER — Inpatient Hospital Stay (HOSPITAL_COMMUNITY): Payer: Medicare Other

## 2021-03-31 ENCOUNTER — Other Ambulatory Visit: Payer: Self-pay

## 2021-03-31 DIAGNOSIS — D649 Anemia, unspecified: Secondary | ICD-10-CM

## 2021-03-31 DIAGNOSIS — Z5111 Encounter for antineoplastic chemotherapy: Secondary | ICD-10-CM | POA: Diagnosis not present

## 2021-03-31 DIAGNOSIS — D469 Myelodysplastic syndrome, unspecified: Secondary | ICD-10-CM

## 2021-03-31 LAB — CBC
HCT: 26.8 % — ABNORMAL LOW (ref 39.0–52.0)
Hemoglobin: 8.3 g/dL — ABNORMAL LOW (ref 13.0–17.0)
MCH: 25.9 pg — ABNORMAL LOW (ref 26.0–34.0)
MCHC: 31 g/dL (ref 30.0–36.0)
MCV: 83.8 fL (ref 80.0–100.0)
Platelets: 28 10*3/uL — CL (ref 150–400)
RBC: 3.2 MIL/uL — ABNORMAL LOW (ref 4.22–5.81)
RDW: 19.6 % — ABNORMAL HIGH (ref 11.5–15.5)
WBC: 4.4 10*3/uL (ref 4.0–10.5)
nRBC: 0.5 % — ABNORMAL HIGH (ref 0.0–0.2)

## 2021-03-31 LAB — SAMPLE TO BLOOD BANK

## 2021-03-31 MED ORDER — HEPARIN SOD (PORK) LOCK FLUSH 100 UNIT/ML IV SOLN
500.0000 [IU] | Freq: Once | INTRAVENOUS | Status: AC
  Administered 2021-03-31: 500 [IU] via INTRAVENOUS

## 2021-03-31 MED ORDER — SODIUM CHLORIDE 0.9% FLUSH
10.0000 mL | INTRAVENOUS | Status: DC | PRN
  Administered 2021-03-31: 10 mL via INTRAVENOUS

## 2021-03-31 NOTE — Progress Notes (Signed)
CRITICAL VALUE ALERT Critical value received:  Platelets 28 Date of notification:  03/31/2021 Time of notification: 08:55 am  Critical value read back:  Yes.   Nurse who received alert:  [place]nell RN / Received alert from Avis Epley lab [employer].  MD notified time and response:  Dr. Delton Coombes / Sylvester Harder RN. May discharge patient home.

## 2021-03-31 NOTE — Progress Notes (Signed)
Pt here for possible blood transfusion.  No complaints today.  No pain today. Hemoglobin 8.3.  No transfusion needed today.

## 2021-04-01 ENCOUNTER — Telehealth: Payer: Self-pay

## 2021-04-01 NOTE — Telephone Encounter (Signed)
Patient called and notified. Patient reports  he started taking medication yesterday. Patient will complete.

## 2021-04-01 NOTE — Telephone Encounter (Signed)
-----   Message from Cleon Gustin, MD sent at 04/01/2021  9:15 AM EST ----- Continue macrobid ----- Message ----- From: Dorisann Frames, RN Sent: 03/31/2021  11:01 AM EST To: Cleon Gustin, MD  Macrobid started on 11/09

## 2021-04-07 ENCOUNTER — Other Ambulatory Visit (HOSPITAL_COMMUNITY): Payer: Medicare Other

## 2021-04-07 ENCOUNTER — Inpatient Hospital Stay (HOSPITAL_COMMUNITY): Payer: Medicare Other

## 2021-04-07 ENCOUNTER — Other Ambulatory Visit: Payer: Self-pay

## 2021-04-07 VITALS — BP 87/55 | HR 70 | Temp 96.2°F | Resp 16

## 2021-04-07 DIAGNOSIS — D649 Anemia, unspecified: Secondary | ICD-10-CM

## 2021-04-07 DIAGNOSIS — D469 Myelodysplastic syndrome, unspecified: Secondary | ICD-10-CM

## 2021-04-07 DIAGNOSIS — Z5111 Encounter for antineoplastic chemotherapy: Secondary | ICD-10-CM | POA: Diagnosis not present

## 2021-04-07 LAB — CBC
HCT: 25.1 % — ABNORMAL LOW (ref 39.0–52.0)
Hemoglobin: 7.7 g/dL — ABNORMAL LOW (ref 13.0–17.0)
MCH: 25.9 pg — ABNORMAL LOW (ref 26.0–34.0)
MCHC: 30.7 g/dL (ref 30.0–36.0)
MCV: 84.5 fL (ref 80.0–100.0)
Platelets: 34 10*3/uL — ABNORMAL LOW (ref 150–400)
RBC: 2.97 MIL/uL — ABNORMAL LOW (ref 4.22–5.81)
RDW: 18.8 % — ABNORMAL HIGH (ref 11.5–15.5)
WBC: 3.6 10*3/uL — ABNORMAL LOW (ref 4.0–10.5)
nRBC: 0.8 % — ABNORMAL HIGH (ref 0.0–0.2)

## 2021-04-07 LAB — PREPARE RBC (CROSSMATCH)

## 2021-04-07 LAB — SAMPLE TO BLOOD BANK

## 2021-04-07 MED ORDER — ACETAMINOPHEN 325 MG PO TABS
650.0000 mg | ORAL_TABLET | Freq: Once | ORAL | Status: AC
  Administered 2021-04-07: 650 mg via ORAL
  Filled 2021-04-07: qty 2

## 2021-04-07 MED ORDER — DIPHENHYDRAMINE HCL 25 MG PO CAPS
25.0000 mg | ORAL_CAPSULE | Freq: Once | ORAL | Status: AC
  Administered 2021-04-07: 25 mg via ORAL
  Filled 2021-04-07: qty 1

## 2021-04-07 MED ORDER — SODIUM CHLORIDE 0.9% FLUSH
10.0000 mL | INTRAVENOUS | Status: AC | PRN
  Administered 2021-04-07: 10 mL

## 2021-04-07 MED ORDER — SODIUM CHLORIDE 0.9% IV SOLUTION
250.0000 mL | Freq: Once | INTRAVENOUS | Status: AC
  Administered 2021-04-07: 250 mL via INTRAVENOUS

## 2021-04-07 MED ORDER — HEPARIN SOD (PORK) LOCK FLUSH 100 UNIT/ML IV SOLN
500.0000 [IU] | Freq: Every day | INTRAVENOUS | Status: AC | PRN
  Administered 2021-04-07: 500 [IU]

## 2021-04-07 NOTE — Patient Instructions (Signed)
[facility]  Discharge Instructions: Thank you for choosing [facility] to provide your oncology and hematology care.  If you have a lab appointment with the Cancer Center, please come in thru the Main Entrance and check in at the main information desk.  Wear comfortable clothing and clothing appropriate for easy access to any Portacath or PICC line.   We strive to give you quality time with your provider. You may need to reschedule your appointment if you arrive late (15 or more minutes).  Arriving late affects you and other patients whose appointments are after yours.  Also, if you miss three or more appointments without notifying the office, you may be dismissed from the clinic at the provider's discretion.      For prescription refill requests, have your pharmacy contact our office and allow 72 hours for refills to be completed.        To help prevent nausea and vomiting after your treatment, we encourage you to take your nausea medication as directed.  BELOW ARE SYMPTOMS THAT SHOULD BE REPORTED IMMEDIATELY: *FEVER GREATER THAN 100.4 F (38 C) OR HIGHER *CHILLS OR SWEATING *NAUSEA AND VOMITING THAT IS NOT CONTROLLED WITH YOUR NAUSEA MEDICATION *UNUSUAL SHORTNESS OF BREATH *UNUSUAL BRUISING OR BLEEDING *URINARY PROBLEMS (pain or burning when urinating, or frequent urination) *BOWEL PROBLEMS (unusual diarrhea, constipation, pain near the anus) TENDERNESS IN MOUTH AND THROAT WITH OR WITHOUT PRESENCE OF ULCERS (sore throat, sores in mouth, or a toothache) UNUSUAL RASH, SWELLING OR PAIN  UNUSUAL VAGINAL DISCHARGE OR ITCHING   Items with * indicate a potential emergency and should be followed up as soon as possible or go to the Emergency Department if any problems should occur.  Please show the CHEMOTHERAPY ALERT CARD or IMMUNOTHERAPY ALERT CARD at check-in to the Emergency Department and triage nurse.  Should you have questions after your visit or need to cancel  or reschedule your appointment, please contact [facility] [phone]  and follow the prompts.  Office hours are 8:00 a.m. to 4:30 p.m. Monday - Friday. Please note that voicemails left after 4:00 p.m. may not be returned until the following business day.  We are closed weekends and major holidays. You have access to a nurse at all times for urgent questions. Please call the main number to the clinic [phone] and follow the prompts.  For any non-urgent questions, you may also contact your provider using MyChart. We now offer e-Visits for anyone 18 and older to request care online for non-urgent symptoms. For details visit [email or web address].   Also download the MyChart app! Go to the app store, search "MyChart", open the app, select [facility], and log in with your MyChart username and password.  Due to Covid, a mask is required upon entering the hospital/clinic. If you do not have a mask, one will be given to you upon arrival. For doctor visits, patients may have 1 support person aged 18 or older with them. For treatment visits, patients cannot have anyone with them due to current Covid guidelines and our immunocompromised population.  

## 2021-04-07 NOTE — Progress Notes (Signed)
Patients port flushed without difficulty.  Good blood return noted with no bruising or swelling noted at site.  Patient remains accessed for possible blood products today.  Patient in stable condition with no complaints voiced.

## 2021-04-07 NOTE — Progress Notes (Signed)
Patient presents today for possible blood products.  Patient is in satisfactory condition with complaints of increased fatigue and SOB.  Vital signs are stable.    Labs reviewed.  Hemoglobin today is 7.7.  Tarri Abernethy, PA-C notified and decided to transfuse one unit of PRBC since patient is symptomatic. We will proceed with transfusion as ordered.   Patient tolerated treatment well with no complaints voiced.  Patient left via wheelchair in stable condition.  Vital signs stable at discharge.  Follow up as scheduled.

## 2021-04-08 LAB — TYPE AND SCREEN
ABO/RH(D): B NEG
Antibody Screen: NEGATIVE
Unit division: 0

## 2021-04-08 LAB — BPAM RBC
Blood Product Expiration Date: 202212192359
ISSUE DATE / TIME: 202211211036
Unit Type and Rh: 1700

## 2021-04-13 NOTE — Progress Notes (Signed)
[name], [name] [id number]   CLINIC:  Medical Oncology/Hematology  PCP:  Sharilyn Sites, [place] / [place] [address] [phone]   REASON FOR VISIT:  Follow-up for MDS  PRIOR THERAPY: Aranesp, Retacrit, intermittent PRBC transfusions  NGS Results: not done  CURRENT THERAPY: Azacitidine (initiated on 09/16/2020)  BRIEF ONCOLOGIC HISTORY:  Oncology History  Myelodysplastic syndrome ([facility])  09/16/2020 -  Chemotherapy   Patient is on Treatment Plan : MYELODYSPLASIA  Azacitidine IV D1-7 q28d       CANCER STAGING:  Cancer Staging  No matching staging information was found for the patient.  INTERVAL HISTORY:  [name], a 87 y.o. male, returns for routine follow-up and consideration for next cycle of chemotherapy. [name] was last seen on 03/17/2021.  Due for cycle #7 of Azacitidine today.   Overall, he tells me he has been feeling pretty well. He reports full body joint pain upon movement and fatigue which started 4-5 days ago. He denies light headedness, dizziness, current bleeding, fever, and recent infections. His appetite is good. He denies diarrhea.   Overall, he feels ready for next cycle of chemo today.   REVIEW OF SYSTEMS:  Review of Systems  Constitutional:  Positive for fatigue (40%). Negative for appetite change and fever.  HENT:   Negative for nosebleeds.   Respiratory:  Negative for hemoptysis.   Gastrointestinal:  Negative for blood in stool and diarrhea.  Genitourinary:  Negative for hematuria.   Musculoskeletal:  Positive for arthralgias (4/10).  Neurological:  Negative for dizziness and light-headedness.  Hematological:  Does not bruise/bleed easily.  All other systems reviewed and are negative.  PAST MEDICAL/SURGICAL HISTORY:  Past Medical History:  Diagnosis Date   Bone cancer ([facility])    Chronic kidney insufficiency    COPD (chronic obstructive pulmonary disease) (HCC)    GERD  (gastroesophageal reflux disease)    Pulmonary nodule    Past Surgical History:  Procedure Laterality Date   BACK SURGERY     CATARACT EXTRACTION W/PHACO Right 03/09/2016   Procedure: CATARACT EXTRACTION PHACO AND INTRAOCULAR LENS PLACEMENT RIGHT EYE CDE=8.58;  Surgeon: Tonny Branch, MD;  Location: [facility];  Service: Ophthalmology;  Laterality: Right;  right   CATARACT EXTRACTION W/PHACO Left 04/13/2016   Procedure: CATARACT EXTRACTION PHACO AND INTRAOCULAR LENS PLACEMENT (IOC);  Surgeon: Tonny Branch, MD;  Location: [facility];  Service: Ophthalmology;  Laterality: Left;  CDE: 8.08   KYPHOPLASTY Bilateral 06/09/2019   Procedure: T11 KYPHOPLASTY;  Surgeon: Consuella Lose, MD;  Location: [facility];  Service: Neurosurgery;  Laterality: Bilateral;   None to Date  10/01/15   PORTACATH PLACEMENT Left 10/11/2020   Procedure: INSERTION PORT-A-CATH (attached catheter in left internal jugular);  Surgeon: Virl Cagey, MD;  Location: [facility];  Service: General;  Laterality: Left;    SOCIAL HISTORY:  Social History   Socioeconomic History   Marital status: Widowed    Spouse name: Not on file   Number of children: Not on file   Years of education: Not on file   Highest education level: Not on file  Occupational History   Not on file  Tobacco Use   Smoking status: Former    Years: 15.00    Types: Cigarettes, Cigars    Quit date: 10/01/1990    Years since quitting: 30.5   Smokeless tobacco: Never   Tobacco comments:    Quit x 25-30 years; 2 cigars daily when smoked  Vaping  Use   Vaping Use: Never used  Substance and Sexual Activity   Alcohol use: Not Currently    Alcohol/week: 0.0 standard drinks   Drug use: No   Sexual activity: Never    Birth control/protection: None  Other Topics Concern   Not on file  Social History Narrative   Not on file   Social Determinants of Health   Financial Resource Strain: High Risk   Difficulty of Paying Living Expenses: Hard  Food Insecurity: No Food  Insecurity   Worried About Running Out of Food in the Last Year: Never true   Ran Out of Food in the Last Year: Never true  Transportation Needs: No Transportation Needs   Lack of Transportation (Medical): No   Lack of Transportation (Non-Medical): No  Physical Activity: Inactive   Days of Exercise per Week: 0 days   Minutes of Exercise per Session: 0 min  Stress: No Stress Concern Present   Feeling of Stress : Not at all  Social Connections: Moderately Integrated   Frequency of Communication with Friends and Family: More than three times a week   Frequency of Social Gatherings with Friends and Family: More than three times a week   Attends Religious Services: More than 4 times per year   Active Member of [employer] or Organizations: Yes   Attends Archivist Meetings: More than 4 times per year   Marital Status: Widowed  Human resources officer Violence: Not At Risk   Fear of Current or Ex-Partner: No   Emotionally Abused: No   Physically Abused: No   Sexually Abused: No    FAMILY HISTORY:  Family History  Problem Relation Age of Onset   Kidney disease Mother    Kidney disease Sister    Colon cancer Neg Hx    Gastric cancer Neg Hx    Esophageal cancer Neg Hx     CURRENT MEDICATIONS:  Current Outpatient Medications  Medication Sig Dispense Refill   cefdinir (OMNICEF) 300 MG capsule Take 1 capsule (300 mg total) by mouth daily. For UTI 7 capsule 0   megestrol (MEGACE) 400 MG/10ML suspension Take 10 mLs (400 mg total) by mouth 2 (two) times daily. 480 mL 1   nitrofurantoin, macrocrystal-monohydrate, (MACROBID) 100 MG capsule Take 1 capsule (100 mg total) by mouth every 12 (twelve) hours. 14 capsule 0   pantoprazole (PROTONIX) 40 MG tablet Take 40 mg by mouth daily. Pt no longer taking     predniSONE (DELTASONE) 10 MG tablet Take 10 mg by mouth daily with breakfast.     silodosin (RAPAFLO) 8 MG CAPS capsule Take 1 capsule (8 mg total) by mouth at bedtime. 30 capsule 11    tiotropium (SPIRIVA HANDIHALER) 18 MCG inhalation capsule Place 1 capsule (18 mcg total) into inhaler and inhale daily. 30 capsule 12   acetaminophen (TYLENOL) 325 MG tablet Take 2 tablets (650 mg total) by mouth every 6 (six) hours as needed for mild pain, fever or headache (or Fever >/= 101). (Patient not taking: Reported on 04/14/2021) 30 tablet 2   albuterol (VENTOLIN HFA) 108 (90 Base) MCG/ACT inhaler Inhale 2 puffs into the lungs every 4 (four) hours as needed for wheezing or shortness of breath. (Patient not taking: Reported on 04/14/2021) 18 g 2   ondansetron (ZOFRAN) 4 MG tablet Take 1 tablet (4 mg total) by mouth every 6 (six) hours as needed for nausea or vomiting. (Patient not taking: Reported on 04/14/2021) 12 tablet 0   prochlorperazine (COMPAZINE) 10 MG tablet  Take 1 tablet (10 mg total) by mouth every 6 (six) hours as needed for nausea or vomiting. (Patient not taking: Reported on 04/14/2021) 30 tablet 3   No current facility-administered medications for this visit.   Facility-Administered Medications Ordered in Other Visits  Medication Dose Route Frequency Provider Last Rate Last Admin   0.9 %  sodium chloride infusion   Intravenous Continuous Derek Jack, MD   Stopped at 01/06/21 1018   sodium chloride flush (NS) 0.9 % injection 10 mL  10 mL Intracatheter PRN Derek Jack, MD   10 mL at 01/06/21 1215   sodium chloride flush (NS) 0.9 % injection 10 mL  10 mL Intravenous PRN Derek Jack, MD   10 mL at 02/17/21 0902   sodium chloride flush (NS) 0.9 % injection 10 mL  10 mL Intravenous PRN Derek Jack, MD   10 mL at 03/10/21 0936    ALLERGIES:  No Known Allergies  PHYSICAL EXAM:  Performance status (ECOG): 2 - Symptomatic, <50% confined to bed  There were no vitals filed for this visit. Wt Readings from Last 3 Encounters:  04/14/21 124 lb 6.4 oz (56.4 kg)  03/17/21 123 lb 12.8 oz (56.2 kg)  03/03/21 124 lb (56.2 kg)   Physical Exam Vitals  reviewed.  Constitutional:      Appearance: Normal appearance.     Interventions: Nasal cannula in place.  Cardiovascular:     Rate and Rhythm: Normal rate and regular rhythm.     Pulses: Normal pulses.     Heart sounds: Normal heart sounds.  Pulmonary:     Effort: Pulmonary effort is normal.     Breath sounds: Normal breath sounds.  Musculoskeletal:     Right lower leg: No edema.     Left lower leg: No edema.  Neurological:     General: No focal deficit present.     Mental Status: He is alert and oriented to person, place, and time.  Psychiatric:        Mood and Affect: Mood normal.        Behavior: Behavior normal.    LABORATORY DATA:  I have reviewed the labs as listed.  CBC Latest Ref Rng & Units 04/14/2021 04/07/2021 03/31/2021  WBC 4.0 - 10.5 K/uL 3.4(L) 3.6(L) 4.4  Hemoglobin 13.0 - 17.0 g/dL 9.0(L) 7.7(L) 8.3(L)  Hematocrit 39.0 - 52.0 % 28.3(L) 25.1(L) 26.8(L)  Platelets 150 - 400 K/uL 36(L) 34(L) 28(LL)   CMP Latest Ref Rng & Units 04/14/2021 03/24/2021 03/17/2021  Glucose 70 - 99 mg/dL 95 107(H) 97  BUN 8 - 23 mg/dL 35(H) 36(H) 31(H)  Creatinine 0.61 - 1.24 mg/dL 2.05(H) 1.74(H) 1.88(H)  Sodium 135 - 145 mmol/L 136 136 135  Potassium 3.5 - 5.1 mmol/L 3.8 3.6 3.9  Chloride 98 - 111 mmol/L 106 106 106  CO2 22 - 32 mmol/L '22 24 23  ' Calcium 8.9 - 10.3 mg/dL 9.8 8.9 9.8  Total Protein 6.5 - 8.1 g/dL 6.5 6.7 6.8  Total Bilirubin 0.3 - 1.2 mg/dL 0.9 0.8 0.8  Alkaline Phos 38 - 126 U/L 68 68 71  AST 15 - 41 U/L '20 15 17  ' ALT 0 - 44 U/L '16 13 11    ' DIAGNOSTIC IMAGING:  I have independently reviewed the scans and discussed with the patient. No results found.   ASSESSMENT:  1.  Myelodysplastic syndrome with pancytopenia: - Bone marrow biopsy on 12/08/2016 in [place], [place] showed normocellular marrow with trilineage hematopoiesis with erythroid predominance and dysmegakaryopoiesis.  Blasts were not increased.  Cytogenetics showed 46,  XY,del(12)(p.2p13)[8]/46,XY[12].  FISH was positive for 12p-. - Bone marrow biopsy and testing in 2018 was consistent with myelodysplastic syndrome with single lineage dysplasia (MDS-SLD),IPSS-R score of 2, consistent with low risk category -  Repeat bone marrow biopsy performed on 08/13/2019 by Dr. Delton Coombes with the following results: Hypercellular bone marrow with dyspoietic changes Bone marrow aspirate with 5% blastic cells of granulocytic precursors Flow pathology with 3% blasts Peripheral blood with 2% circulating blasts Cytogenetic analysis show del(12p) and 13 of the examined cells with remaining 7 cells showing normal chromosome Karyotype: 46,XY,del(12)(p13p11.2)[13]/46,XY[7] FISH analysis was normal - Based on most recent bone marrow biopsy (08/04/2020), patient's IPSS-R is 5, showing that he has progressed to the high risk category - He has required multiple blood transfusions over the past 6 months, most recently on 08/09/2020 (Hgb 7.1) - No improvement on monthly Aranesp or on weekly Retacrit injections (started on 07/10/2020 - discontinued 09/04/2020) - Stool occult blood cards x3 were negative -Azacitidine (50 mg/m2) x5 days started on 09/16/2020.   PLAN:  1.  Myelodysplastic syndrome (high risk) with pancytopenia: - Cycle 6 of azacitidine 50 mg/m2 for 5 days on 03/17/2021. - He has received 1 unit of PRBC on 03/24/2021 and 04/07/2021. - Did not have any GI symptoms.  Reported joint pains on all joints on movements since last Wednesday.  No fevers.  Fatigue is stable. - We will repeat CBC next Monday and proceed with cycle 7 if his platelet count is around 50-60 K. - She would like to visit his daughter in [place] around Christmas time. - RTC 6 weeks for follow-up with repeat bone marrow biopsy sent for cytogenetics and MDS FISH panel.  2.  CKD: - Creatinine is 2.05.  Baseline creatinine 1.8-2.0.  3.  Bladder problems: - She was evaluated by Dr. Alyson Ingles.  Rapaflo 8 mg was  started.  Urinary symptoms improved. - His blood pressure is low at 87/55.  I would adjust Rapaflo.  To his renal function at 4 mg daily.   Orders placed this encounter:  Orders Placed This Encounter  Procedures   CBC with Differential   Comprehensive metabolic panel   Lactate dehydrogenase   Magnesium   CBC   Sample to Blood Bank(Blood Bank Hold)     Derek Jack, MD [place] [phone]   I, Thana Ates, am acting as a scribe for Dr. Derek Jack.  I, Derek Jack MD, have reviewed the above documentation for accuracy and completeness, and I agree with the above.

## 2021-04-14 ENCOUNTER — Other Ambulatory Visit (HOSPITAL_COMMUNITY): Payer: Medicare Other

## 2021-04-14 ENCOUNTER — Inpatient Hospital Stay (HOSPITAL_COMMUNITY): Payer: Medicare Other

## 2021-04-14 ENCOUNTER — Inpatient Hospital Stay (HOSPITAL_BASED_OUTPATIENT_CLINIC_OR_DEPARTMENT_OTHER): Payer: Medicare Other | Admitting: Hematology

## 2021-04-14 DIAGNOSIS — D469 Myelodysplastic syndrome, unspecified: Secondary | ICD-10-CM | POA: Diagnosis not present

## 2021-04-14 DIAGNOSIS — D649 Anemia, unspecified: Secondary | ICD-10-CM

## 2021-04-14 DIAGNOSIS — Z5111 Encounter for antineoplastic chemotherapy: Secondary | ICD-10-CM | POA: Diagnosis not present

## 2021-04-14 LAB — CBC WITH DIFFERENTIAL/PLATELET
Abs Immature Granulocytes: 0.07 10*3/uL (ref 0.00–0.07)
Basophils Absolute: 0 10*3/uL (ref 0.0–0.1)
Basophils Relative: 0 %
Eosinophils Absolute: 0 10*3/uL (ref 0.0–0.5)
Eosinophils Relative: 0 %
HCT: 28.3 % — ABNORMAL LOW (ref 39.0–52.0)
Hemoglobin: 9 g/dL — ABNORMAL LOW (ref 13.0–17.0)
Immature Granulocytes: 2 %
Lymphocytes Relative: 20 %
Lymphs Abs: 0.7 10*3/uL (ref 0.7–4.0)
MCH: 26.9 pg (ref 26.0–34.0)
MCHC: 31.8 g/dL (ref 30.0–36.0)
MCV: 84.7 fL (ref 80.0–100.0)
Monocytes Absolute: 0.3 10*3/uL (ref 0.1–1.0)
Monocytes Relative: 9 %
Neutro Abs: 2.4 10*3/uL (ref 1.7–7.7)
Neutrophils Relative %: 69 %
Platelets: 36 10*3/uL — ABNORMAL LOW (ref 150–400)
RBC: 3.34 MIL/uL — ABNORMAL LOW (ref 4.22–5.81)
RDW: 18.6 % — ABNORMAL HIGH (ref 11.5–15.5)
WBC: 3.4 10*3/uL — ABNORMAL LOW (ref 4.0–10.5)
nRBC: 0 % (ref 0.0–0.2)

## 2021-04-14 LAB — COMPREHENSIVE METABOLIC PANEL
ALT: 16 U/L (ref 0–44)
AST: 20 U/L (ref 15–41)
Albumin: 3.2 g/dL — ABNORMAL LOW (ref 3.5–5.0)
Alkaline Phosphatase: 68 U/L (ref 38–126)
Anion gap: 8 (ref 5–15)
BUN: 35 mg/dL — ABNORMAL HIGH (ref 8–23)
CO2: 22 mmol/L (ref 22–32)
Calcium: 9.8 mg/dL (ref 8.9–10.3)
Chloride: 106 mmol/L (ref 98–111)
Creatinine, Ser: 2.05 mg/dL — ABNORMAL HIGH (ref 0.61–1.24)
GFR, Estimated: 30 mL/min — ABNORMAL LOW (ref >60)
Glucose, Bld: 95 mg/dL (ref 70–99)
Potassium: 3.8 mmol/L (ref 3.5–5.1)
Sodium: 136 mmol/L (ref 135–145)
Total Bilirubin: 0.9 mg/dL (ref 0.3–1.2)
Total Protein: 6.5 g/dL (ref 6.5–8.1)

## 2021-04-14 LAB — SAMPLE TO BLOOD BANK

## 2021-04-14 LAB — MAGNESIUM: Magnesium: 2 mg/dL (ref 1.7–2.4)

## 2021-04-14 LAB — LACTATE DEHYDROGENASE: LDH: 136 U/L (ref 98–192)

## 2021-04-14 MED ORDER — HEPARIN SOD (PORK) LOCK FLUSH 100 UNIT/ML IV SOLN
500.0000 [IU] | Freq: Once | INTRAVENOUS | Status: AC
  Administered 2021-04-14: 10:00:00 500 [IU] via INTRAVENOUS

## 2021-04-14 MED ORDER — SILODOSIN 4 MG PO CAPS: 4.0000 mg | ORAL_CAPSULE | Freq: Every day | ORAL | 10 refills | Status: DC

## 2021-04-14 MED ORDER — SODIUM CHLORIDE 0.9% FLUSH
10.0000 mL | Freq: Once | INTRAVENOUS | Status: AC
  Administered 2021-04-14: 10:00:00 10 mL via INTRAVENOUS

## 2021-04-14 NOTE — Patient Instructions (Signed)
[facility]  Discharge Instructions: Thank you for choosing [facility] to provide your oncology and hematology care.  If you have a lab appointment with the [facility], please come in thru the Main Entrance and check in at the main information desk.  Wear comfortable clothing and clothing appropriate for easy access to any Portacath or PICC line.   We strive to give you quality time with your provider. You may need to reschedule your appointment if you arrive late (15 or more minutes).  Arriving late affects you and other patients whose appointments are after yours.  Also, if you miss three or more appointments without notifying the office, you may be dismissed from the clinic at the provider's discretion.      For prescription refill requests, have your pharmacy contact our office and allow 72 hours for refills to be completed.    Today you received the following chemotherapy and/or immunotherapy agents no treatment      To help prevent nausea and vomiting after your treatment, we encourage you to take your nausea medication as directed.  BELOW ARE SYMPTOMS THAT SHOULD BE REPORTED IMMEDIATELY: *FEVER GREATER THAN 100.4 F (38 C) OR HIGHER *CHILLS OR SWEATING *NAUSEA AND VOMITING THAT IS NOT CONTROLLED WITH YOUR NAUSEA MEDICATION *UNUSUAL SHORTNESS OF BREATH *UNUSUAL BRUISING OR BLEEDING *URINARY PROBLEMS (pain or burning when urinating, or frequent urination) *BOWEL PROBLEMS (unusual diarrhea, constipation, pain near the anus) TENDERNESS IN MOUTH AND THROAT WITH OR WITHOUT PRESENCE OF ULCERS (sore throat, sores in mouth, or a toothache) UNUSUAL RASH, SWELLING OR PAIN  UNUSUAL VAGINAL DISCHARGE OR ITCHING   Items with * indicate a potential emergency and should be followed up as soon as possible or go to the Emergency Department if any problems should occur.  Please show the CHEMOTHERAPY ALERT CARD or IMMUNOTHERAPY ALERT CARD at check-in to the Emergency  Department and triage nurse.  Should you have questions after your visit or need to cancel or reschedule your appointment, please contact [facility] [phone]  and follow the prompts.  Office hours are 8:00 a.m. to 4:30 p.m. Monday - Friday. Please note that voicemails left after 4:00 p.m. may not be returned until the following business day.  We are closed weekends and major holidays. You have access to a nurse at all times for urgent questions. Please call the main number to the clinic [phone] and follow the prompts.  For any non-urgent questions, you may also contact your provider using MyChart. We now offer e-Visits for anyone 85 and older to request care online for non-urgent symptoms. For details visit [email or web address].   Also download the MyChart app! Go to the app store, search "MyChart", open the app, select , and log in with your MyChart username and password.  Due to Covid, a mask is required upon entering the hospital/clinic. If you do not have a mask, one will be given to you upon arrival. For doctor visits, patients may have 1 support person aged 1 or older with them. For treatment visits, patients cannot have anyone with them due to current Covid guidelines and our immunocompromised population.

## 2021-04-14 NOTE — Patient Instructions (Signed)
[facility] at [facility] Discharge Instructions  You were seen and examined today by Dr. Vickey Huger. We will defer your treatment for a week to allow your platelets to return to your baseline. We will resume treatment next week. We will continue to check your blood weekly to see if you need transfusion.  We will schedule you for a bone marrow biopsy after your next treatment. Return for office visit as scheduled after bone marrow biopsy.   Thank you for choosing [facility] at [facility] to provide your oncology and hematology care.  To afford each patient quality time with our provider, please arrive at least 15 minutes before your scheduled appointment time.   If you have a lab appointment with the [facility] please come in thru the Main Entrance and check in at the main information desk.  You need to re-schedule your appointment should you arrive 10 or more minutes late.  We strive to give you quality time with our providers, and arriving late affects you and other patients whose appointments are after yours.  Also, if you no show three or more times for appointments you may be dismissed from the clinic at the providers discretion.     Again, thank you for choosing [facility].  Our hope is that these requests will decrease the amount of time that you wait before being seen by our physicians.       _____________________________________________________________  Should you have questions after your visit to [facility], please contact our office at [phone] and follow the prompts.  Our office hours are 8:00 a.m. and 4:30 p.m. Monday - Friday.  Please note that voicemails left after 4:00 p.m. may not be returned until the following business day.  We are closed weekends and major holidays.  You do have access to a nurse 24-7, just call the main number to the clinic [phone] and do not press any options, hold on the  line and a nurse will answer the phone.    For prescription refill requests, have your pharmacy contact our office and allow 72 hours.    Due to Covid, you will need to wear a mask upon entering the hospital. If you do not have a mask, a mask will be given to you at the Main Entrance upon arrival. For doctor visits, patients may have 1 support person age 70 or older with them. For treatment visits, patients can not have anyone with them due to social distancing guidelines and our immunocompromised population.

## 2021-04-14 NOTE — Progress Notes (Signed)
Patient presents today for Vidaza infusion per providers order.  Vital signs within parameters for treatment.  Labs pending.  Patients only complaint is fatigue.  No treatment today per Dr. Delton Coombes due to low platelet count.  Discharge from clinic via wheelchair in stable condition.  Alert and oriented X 3.  Follow up with [facility] as scheduled.

## 2021-04-15 ENCOUNTER — Inpatient Hospital Stay (HOSPITAL_COMMUNITY): Payer: Medicare Other

## 2021-04-16 ENCOUNTER — Ambulatory Visit (HOSPITAL_COMMUNITY): Payer: Medicare Other

## 2021-04-17 ENCOUNTER — Ambulatory Visit (HOSPITAL_COMMUNITY): Payer: Medicare Other

## 2021-04-17 ENCOUNTER — Encounter (HOSPITAL_COMMUNITY): Payer: Medicare Other | Admitting: Dietician

## 2021-04-18 ENCOUNTER — Ambulatory Visit (HOSPITAL_COMMUNITY): Payer: Medicare Other

## 2021-04-21 ENCOUNTER — Encounter (HOSPITAL_COMMUNITY): Payer: Self-pay

## 2021-04-21 ENCOUNTER — Inpatient Hospital Stay (HOSPITAL_COMMUNITY): Payer: Medicare Other

## 2021-04-21 ENCOUNTER — Other Ambulatory Visit: Payer: Self-pay

## 2021-04-21 ENCOUNTER — Inpatient Hospital Stay (HOSPITAL_COMMUNITY): Payer: Medicare Other | Attending: Hematology

## 2021-04-21 VITALS — BP 91/52 | HR 81 | Temp 97.0°F | Resp 20

## 2021-04-21 DIAGNOSIS — Z79899 Other long term (current) drug therapy: Secondary | ICD-10-CM | POA: Diagnosis not present

## 2021-04-21 DIAGNOSIS — Z5111 Encounter for antineoplastic chemotherapy: Secondary | ICD-10-CM | POA: Insufficient documentation

## 2021-04-21 DIAGNOSIS — D469 Myelodysplastic syndrome, unspecified: Secondary | ICD-10-CM | POA: Insufficient documentation

## 2021-04-21 DIAGNOSIS — D649 Anemia, unspecified: Secondary | ICD-10-CM

## 2021-04-21 LAB — CBC WITH DIFFERENTIAL/PLATELET
Abs Immature Granulocytes: 0.08 10*3/uL — ABNORMAL HIGH (ref 0.00–0.07)
Basophils Absolute: 0 10*3/uL (ref 0.0–0.1)
Basophils Relative: 0 %
Eosinophils Absolute: 0 10*3/uL (ref 0.0–0.5)
Eosinophils Relative: 0 %
HCT: 26.9 % — ABNORMAL LOW (ref 39.0–52.0)
Hemoglobin: 8.3 g/dL — ABNORMAL LOW (ref 13.0–17.0)
Immature Granulocytes: 2 %
Lymphocytes Relative: 13 %
Lymphs Abs: 0.7 10*3/uL (ref 0.7–4.0)
MCH: 25.8 pg — ABNORMAL LOW (ref 26.0–34.0)
MCHC: 30.9 g/dL (ref 30.0–36.0)
MCV: 83.5 fL (ref 80.0–100.0)
Monocytes Absolute: 0.3 10*3/uL (ref 0.1–1.0)
Monocytes Relative: 7 %
Neutro Abs: 4 10*3/uL (ref 1.7–7.7)
Neutrophils Relative %: 78 %
Platelets: 57 10*3/uL — ABNORMAL LOW (ref 150–400)
RBC: 3.22 MIL/uL — ABNORMAL LOW (ref 4.22–5.81)
RDW: 18.2 % — ABNORMAL HIGH (ref 11.5–15.5)
WBC: 5.1 10*3/uL (ref 4.0–10.5)
nRBC: 0 % (ref 0.0–0.2)

## 2021-04-21 LAB — COMPREHENSIVE METABOLIC PANEL
ALT: 13 U/L (ref 0–44)
AST: 19 U/L (ref 15–41)
Albumin: 3.2 g/dL — ABNORMAL LOW (ref 3.5–5.0)
Alkaline Phosphatase: 68 U/L (ref 38–126)
Anion gap: 8 (ref 5–15)
BUN: 39 mg/dL — ABNORMAL HIGH (ref 8–23)
CO2: 21 mmol/L — ABNORMAL LOW (ref 22–32)
Calcium: 9.6 mg/dL (ref 8.9–10.3)
Chloride: 107 mmol/L (ref 98–111)
Creatinine, Ser: 2.03 mg/dL — ABNORMAL HIGH (ref 0.61–1.24)
GFR, Estimated: 31 mL/min — ABNORMAL LOW (ref >60)
Glucose, Bld: 92 mg/dL (ref 70–99)
Potassium: 3.5 mmol/L (ref 3.5–5.1)
Sodium: 136 mmol/L (ref 135–145)
Total Bilirubin: 0.4 mg/dL (ref 0.3–1.2)
Total Protein: 6.4 g/dL — ABNORMAL LOW (ref 6.5–8.1)

## 2021-04-21 LAB — SAMPLE TO BLOOD BANK

## 2021-04-21 LAB — MAGNESIUM: Magnesium: 1.9 mg/dL (ref 1.7–2.4)

## 2021-04-21 MED ORDER — SODIUM CHLORIDE 0.9 % IV SOLN
10.0000 mg | Freq: Once | INTRAVENOUS | Status: AC
  Administered 2021-04-21: 10 mg via INTRAVENOUS
  Filled 2021-04-21: qty 10

## 2021-04-21 MED ORDER — PALONOSETRON HCL INJECTION 0.25 MG/5ML
0.2500 mg | Freq: Once | INTRAVENOUS | Status: AC
  Administered 2021-04-21: 0.25 mg via INTRAVENOUS
  Filled 2021-04-21: qty 5

## 2021-04-21 MED ORDER — HEPARIN SOD (PORK) LOCK FLUSH 100 UNIT/ML IV SOLN
500.0000 [IU] | Freq: Once | INTRAVENOUS | Status: AC | PRN
  Administered 2021-04-21: 500 [IU]

## 2021-04-21 MED ORDER — SODIUM CHLORIDE 0.9% FLUSH
10.0000 mL | INTRAVENOUS | Status: DC | PRN
  Administered 2021-04-21: 10 mL

## 2021-04-21 MED ORDER — SODIUM CHLORIDE 0.9 % IV SOLN: Freq: Once | INTRAVENOUS | Status: AC

## 2021-04-21 MED ORDER — SODIUM CHLORIDE 0.9 % IV SOLN
50.0000 mg/m2 | Freq: Once | INTRAVENOUS | Status: AC
  Administered 2021-04-21: 90 mg via INTRAVENOUS
  Filled 2021-04-21: qty 9

## 2021-04-21 NOTE — Progress Notes (Signed)
Patient presents today for [place]. Ser. Creatinine 2.03. BUN 39 and Platelets 57. Hgb 8.3. Dr. Delton Coombes made aware, okay for treatment per MD. Patient tolerated chemotherapy with no complaints voiced. Side effects with management reviewed understanding verbalized. Port site clean and dry with no bruising or swelling noted at site. Good blood return noted before and after administration of chemotherapy. Patient declined AVS.Patient left in satisfactory condition via wheelchair with VSS and no s/s of distress noted.

## 2021-04-21 NOTE — Patient Instructions (Signed)
[facility]  Discharge Instructions: Thank you for choosing [facility] to provide your oncology and hematology care.  If you have a lab appointment with the [facility], please come in thru the Main Entrance and check in at the main information desk.  Wear comfortable clothing and clothing appropriate for easy access to any Portacath or PICC line.   We strive to give you quality time with your provider. You may need to reschedule your appointment if you arrive late (15 or more minutes).  Arriving late affects you and other patients whose appointments are after yours.  Also, if you miss three or more appointments without notifying the office, you may be dismissed from the clinic at the provider's discretion.      For prescription refill requests, have your pharmacy contact our office and allow 72 hours for refills to be completed.    Today you received the following chemotherapy and/or immunotherapy agents Vidaza, return as scheduled.   To help prevent nausea and vomiting after your treatment, we encourage you to take your nausea medication as directed.  BELOW ARE SYMPTOMS THAT SHOULD BE REPORTED IMMEDIATELY: *FEVER GREATER THAN 100.4 F (38 C) OR HIGHER *CHILLS OR SWEATING *NAUSEA AND VOMITING THAT IS NOT CONTROLLED WITH YOUR NAUSEA MEDICATION *UNUSUAL SHORTNESS OF BREATH *UNUSUAL BRUISING OR BLEEDING *URINARY PROBLEMS (pain or burning when urinating, or frequent urination) *BOWEL PROBLEMS (unusual diarrhea, constipation, pain near the anus) TENDERNESS IN MOUTH AND THROAT WITH OR WITHOUT PRESENCE OF ULCERS (sore throat, sores in mouth, or a toothache) UNUSUAL RASH, SWELLING OR PAIN  UNUSUAL VAGINAL DISCHARGE OR ITCHING   Items with * indicate a potential emergency and should be followed up as soon as possible or go to the Emergency Department if any problems should occur.  Please show the CHEMOTHERAPY ALERT CARD or IMMUNOTHERAPY ALERT CARD at check-in to the  Emergency Department and triage nurse.  Should you have questions after your visit or need to cancel or reschedule your appointment, please contact [facility] [phone]  and follow the prompts.  Office hours are 8:00 a.m. to 4:30 p.m. Monday - Friday. Please note that voicemails left after 4:00 p.m. may not be returned until the following business day.  We are closed weekends and major holidays. You have access to a nurse at all times for urgent questions. Please call the main number to the clinic [phone] and follow the prompts.  For any non-urgent questions, you may also contact your provider using MyChart. We now offer e-Visits for anyone 50 and older to request care online for non-urgent symptoms. For details visit [email or web address].   Also download the MyChart app! Go to the app store, search "MyChart", open the app, select [facility], and log in with your MyChart username and password.  Due to Covid, a mask is required upon entering the hospital/clinic. If you do not have a mask, one will be given to you upon arrival. For doctor visits, patients may have 1 support person aged 42 or older with them. For treatment visits, patients cannot have anyone with them due to current Covid guidelines and our immunocompromised population.

## 2021-04-22 ENCOUNTER — Inpatient Hospital Stay (HOSPITAL_COMMUNITY): Payer: Medicare Other

## 2021-04-22 VITALS — BP 92/50 | HR 74 | Temp 97.9°F | Resp 18

## 2021-04-22 DIAGNOSIS — D469 Myelodysplastic syndrome, unspecified: Secondary | ICD-10-CM

## 2021-04-22 DIAGNOSIS — Z5111 Encounter for antineoplastic chemotherapy: Secondary | ICD-10-CM | POA: Diagnosis not present

## 2021-04-22 MED ORDER — SODIUM CHLORIDE 0.9 % IV SOLN: Freq: Once | INTRAVENOUS | Status: AC

## 2021-04-22 MED ORDER — SODIUM CHLORIDE 0.9 % IV SOLN
10.0000 mg | Freq: Once | INTRAVENOUS | Status: AC
  Administered 2021-04-22: 10 mg via INTRAVENOUS
  Filled 2021-04-22: qty 10

## 2021-04-22 MED ORDER — SODIUM CHLORIDE 0.9% FLUSH
10.0000 mL | INTRAVENOUS | Status: DC | PRN
  Administered 2021-04-22: 10 mL

## 2021-04-22 MED ORDER — SODIUM CHLORIDE 0.9 % IV SOLN
50.0000 mg/m2 | Freq: Once | INTRAVENOUS | Status: AC
  Administered 2021-04-22: 90 mg via INTRAVENOUS
  Filled 2021-04-22: qty 9

## 2021-04-22 MED ORDER — HEPARIN SOD (PORK) LOCK FLUSH 100 UNIT/ML IV SOLN
500.0000 [IU] | Freq: Once | INTRAVENOUS | Status: AC | PRN
  Administered 2021-04-22: 500 [IU]

## 2021-04-22 NOTE — Patient Instructions (Signed)
[facility]  Discharge Instructions: Thank you for choosing [facility] to provide your oncology and hematology care.  If you have a lab appointment with the Cancer Center, please come in thru the Main Entrance and check in at the main information desk.  Wear comfortable clothing and clothing appropriate for easy access to any Portacath or PICC line.   We strive to give you quality time with your provider. You may need to reschedule your appointment if you arrive late (15 or more minutes).  Arriving late affects you and other patients whose appointments are after yours.  Also, if you miss three or more appointments without notifying the office, you may be dismissed from the clinic at the provider's discretion.      For prescription refill requests, have your pharmacy contact our office and allow 72 hours for refills to be completed.        To help prevent nausea and vomiting after your treatment, we encourage you to take your nausea medication as directed.  BELOW ARE SYMPTOMS THAT SHOULD BE REPORTED IMMEDIATELY: *FEVER GREATER THAN 100.4 F (38 C) OR HIGHER *CHILLS OR SWEATING *NAUSEA AND VOMITING THAT IS NOT CONTROLLED WITH YOUR NAUSEA MEDICATION *UNUSUAL SHORTNESS OF BREATH *UNUSUAL BRUISING OR BLEEDING *URINARY PROBLEMS (pain or burning when urinating, or frequent urination) *BOWEL PROBLEMS (unusual diarrhea, constipation, pain near the anus) TENDERNESS IN MOUTH AND THROAT WITH OR WITHOUT PRESENCE OF ULCERS (sore throat, sores in mouth, or a toothache) UNUSUAL RASH, SWELLING OR PAIN  UNUSUAL VAGINAL DISCHARGE OR ITCHING   Items with * indicate a potential emergency and should be followed up as soon as possible or go to the Emergency Department if any problems should occur.  Please show the CHEMOTHERAPY ALERT CARD or IMMUNOTHERAPY ALERT CARD at check-in to the Emergency Department and triage nurse.  Should you have questions after your visit or need to cancel  or reschedule your appointment, please contact  CANCER CENTER [phone]  and follow the prompts.  Office hours are 8:00 a.m. to 4:30 p.m. Monday - Friday. Please note that voicemails left after 4:00 p.m. may not be returned until the following business day.  We are closed weekends and major holidays. You have access to a nurse at all times for urgent questions. Please call the main number to the clinic [phone] and follow the prompts.  For any non-urgent questions, you may also contact your provider using MyChart. We now offer e-Visits for anyone 18 and older to request care online for non-urgent symptoms. For details visit [email or web address].   Also download the MyChart app! Go to the app store, search "MyChart", open the app, select , and log in with your MyChart username and password.  Due to Covid, a mask is required upon entering the hospital/clinic. If you do not have a mask, one will be given to you upon arrival. For doctor visits, patients may have 1 support person aged 18 or older with them. For treatment visits, patients cannot have anyone with them due to current Covid guidelines and our immunocompromised population.  

## 2021-04-22 NOTE — Progress Notes (Signed)
Patient presents today for Vidaza infusion.  Patient is in satisfactory condition with no new complaints voiced.  Vital signs are stable.  We will proceed with treatment per MD orders.  Patient tolerated treatment well with no complaints voiced.  Patient left ambulatory in stable condition.  Vital signs stable at discharge.  Follow up as scheduled.    

## 2021-04-23 ENCOUNTER — Inpatient Hospital Stay (HOSPITAL_COMMUNITY): Payer: Medicare Other

## 2021-04-23 ENCOUNTER — Other Ambulatory Visit: Payer: Self-pay

## 2021-04-23 ENCOUNTER — Ambulatory Visit: Payer: Medicare Other | Admitting: Urology

## 2021-04-23 VITALS — BP 88/40 | HR 67 | Temp 96.2°F | Resp 16

## 2021-04-23 DIAGNOSIS — D469 Myelodysplastic syndrome, unspecified: Secondary | ICD-10-CM

## 2021-04-23 DIAGNOSIS — Z5111 Encounter for antineoplastic chemotherapy: Secondary | ICD-10-CM | POA: Diagnosis not present

## 2021-04-23 MED ORDER — SODIUM CHLORIDE 0.9 % IV SOLN
10.0000 mg | Freq: Once | INTRAVENOUS | Status: AC
  Administered 2021-04-23: 10 mg via INTRAVENOUS
  Filled 2021-04-23: qty 10

## 2021-04-23 MED ORDER — PALONOSETRON HCL INJECTION 0.25 MG/5ML
0.2500 mg | Freq: Once | INTRAVENOUS | Status: AC
  Administered 2021-04-23: 0.25 mg via INTRAVENOUS
  Filled 2021-04-23: qty 5

## 2021-04-23 MED ORDER — HEPARIN SOD (PORK) LOCK FLUSH 100 UNIT/ML IV SOLN
500.0000 [IU] | Freq: Once | INTRAVENOUS | Status: AC | PRN
  Administered 2021-04-23: 500 [IU]

## 2021-04-23 MED ORDER — SODIUM CHLORIDE 0.9 % IV SOLN
50.0000 mg/m2 | Freq: Once | INTRAVENOUS | Status: AC
  Administered 2021-04-23: 90 mg via INTRAVENOUS
  Filled 2021-04-23: qty 9

## 2021-04-23 MED ORDER — SODIUM CHLORIDE 0.9 % IV SOLN: Freq: Once | INTRAVENOUS | Status: AC

## 2021-04-23 MED ORDER — SODIUM CHLORIDE 0.9% FLUSH
10.0000 mL | INTRAVENOUS | Status: DC | PRN
  Administered 2021-04-23: 10 mL

## 2021-04-23 NOTE — Patient Instructions (Signed)
[facility]  Discharge Instructions: Thank you for choosing [facility] to provide your oncology and hematology care.  If you have a lab appointment with the Cancer Center, please come in thru the Main Entrance and check in at the main information desk.  Wear comfortable clothing and clothing appropriate for easy access to any Portacath or PICC line.   We strive to give you quality time with your provider. You may need to reschedule your appointment if you arrive late (15 or more minutes).  Arriving late affects you and other patients whose appointments are after yours.  Also, if you miss three or more appointments without notifying the office, you may be dismissed from the clinic at the provider's discretion.      For prescription refill requests, have your pharmacy contact our office and allow 72 hours for refills to be completed.        To help prevent nausea and vomiting after your treatment, we encourage you to take your nausea medication as directed.  BELOW ARE SYMPTOMS THAT SHOULD BE REPORTED IMMEDIATELY: *FEVER GREATER THAN 100.4 F (38 C) OR HIGHER *CHILLS OR SWEATING *NAUSEA AND VOMITING THAT IS NOT CONTROLLED WITH YOUR NAUSEA MEDICATION *UNUSUAL SHORTNESS OF BREATH *UNUSUAL BRUISING OR BLEEDING *URINARY PROBLEMS (pain or burning when urinating, or frequent urination) *BOWEL PROBLEMS (unusual diarrhea, constipation, pain near the anus) TENDERNESS IN MOUTH AND THROAT WITH OR WITHOUT PRESENCE OF ULCERS (sore throat, sores in mouth, or a toothache) UNUSUAL RASH, SWELLING OR PAIN  UNUSUAL VAGINAL DISCHARGE OR ITCHING   Items with * indicate a potential emergency and should be followed up as soon as possible or go to the Emergency Department if any problems should occur.  Please show the CHEMOTHERAPY ALERT CARD or IMMUNOTHERAPY ALERT CARD at check-in to the Emergency Department and triage nurse.  Should you have questions after your visit or need to cancel  or reschedule your appointment, please contact [facility] [phone]  and follow the prompts.  Office hours are 8:00 a.m. to 4:30 p.m. Monday - Friday. Please note that voicemails left after 4:00 p.m. may not be returned until the following business day.  We are closed weekends and major holidays. You have access to a nurse at all times for urgent questions. Please call the main number to the clinic [phone] and follow the prompts.  For any non-urgent questions, you may also contact your provider using MyChart. We now offer e-Visits for anyone 18 and older to request care online for non-urgent symptoms. For details visit [email or web address].   Also download the MyChart app! Go to the app store, search "MyChart", open the app, select [facility], and log in with your MyChart username and password.  Due to Covid, a mask is required upon entering the hospital/clinic. If you do not have a mask, one will be given to you upon arrival. For doctor visits, patients may have 1 support person aged 18 or older with them. For treatment visits, patients cannot have anyone with them due to current Covid guidelines and our immunocompromised population.  

## 2021-04-23 NOTE — Progress Notes (Signed)
Patient presents today for Vidaza infusion.  Patient is in satisfactory condition with no complaints voiced.  Vital signs are stable.  We will proceed with treatment per MD orders.   Patient tolerated treatment well with no complaints voiced.  Patient left via motorized wheelchair in stable condition.  Vital signs stable at discharge.  Follow up as scheduled.

## 2021-04-24 ENCOUNTER — Inpatient Hospital Stay (HOSPITAL_COMMUNITY): Payer: Medicare Other

## 2021-04-24 VITALS — BP 100/61 | HR 74 | Temp 97.6°F | Resp 18 | Ht 76.0 in | Wt 132.8 lb

## 2021-04-24 DIAGNOSIS — D469 Myelodysplastic syndrome, unspecified: Secondary | ICD-10-CM

## 2021-04-24 DIAGNOSIS — Z5111 Encounter for antineoplastic chemotherapy: Secondary | ICD-10-CM | POA: Diagnosis not present

## 2021-04-24 MED ORDER — HEPARIN SOD (PORK) LOCK FLUSH 100 UNIT/ML IV SOLN
500.0000 [IU] | Freq: Once | INTRAVENOUS | Status: AC | PRN
  Administered 2021-04-24: 500 [IU]

## 2021-04-24 MED ORDER — SODIUM CHLORIDE 0.9 % IV SOLN: Freq: Once | INTRAVENOUS | Status: AC

## 2021-04-24 MED ORDER — SODIUM CHLORIDE 0.9 % IV SOLN
50.0000 mg/m2 | Freq: Once | INTRAVENOUS | Status: AC
  Administered 2021-04-24: 90 mg via INTRAVENOUS
  Filled 2021-04-24: qty 9

## 2021-04-24 MED ORDER — SODIUM CHLORIDE 0.9% FLUSH
10.0000 mL | INTRAVENOUS | Status: DC | PRN
  Administered 2021-04-24: 10 mL

## 2021-04-24 MED ORDER — SODIUM CHLORIDE 0.9 % IV SOLN
10.0000 mg | Freq: Once | INTRAVENOUS | Status: AC
  Administered 2021-04-24: 10 mg via INTRAVENOUS
  Filled 2021-04-24: qty 10

## 2021-04-24 MED FILL — Dexamethasone Sodium Phosphate Inj 100 MG/10ML: INTRAMUSCULAR | Qty: 1 | Status: AC

## 2021-04-24 MED FILL — Azacitidine For Inj 100 MG: INTRAMUSCULAR | Qty: 9 | Status: AC

## 2021-04-24 NOTE — Patient Instructions (Signed)
[facility]  Discharge Instructions: Thank you for choosing [facility] to provide your oncology and hematology care.  If you have a lab appointment with the [facility], please come in thru the Main Entrance and check in at the main information desk.  Wear comfortable clothing and clothing appropriate for easy access to any Portacath or PICC line.   We strive to give you quality time with your provider. You may need to reschedule your appointment if you arrive late (15 or more minutes).  Arriving late affects you and other patients whose appointments are after yours.  Also, if you miss three or more appointments without notifying the office, you may be dismissed from the clinic at the provider's discretion.      For prescription refill requests, have your pharmacy contact our office and allow 72 hours for refills to be completed.    Today you received the following chemotherapy and/or immunotherapy agents D4 Vidaza.     To help prevent nausea and vomiting after your treatment, we encourage you to take your nausea medication as directed.  BELOW ARE SYMPTOMS THAT SHOULD BE REPORTED IMMEDIATELY: *FEVER GREATER THAN 100.4 F (38 C) OR HIGHER *CHILLS OR SWEATING *NAUSEA AND VOMITING THAT IS NOT CONTROLLED WITH YOUR NAUSEA MEDICATION *UNUSUAL SHORTNESS OF BREATH *UNUSUAL BRUISING OR BLEEDING *URINARY PROBLEMS (pain or burning when urinating, or frequent urination) *BOWEL PROBLEMS (unusual diarrhea, constipation, pain near the anus) TENDERNESS IN MOUTH AND THROAT WITH OR WITHOUT PRESENCE OF ULCERS (sore throat, sores in mouth, or a toothache) UNUSUAL RASH, SWELLING OR PAIN  UNUSUAL VAGINAL DISCHARGE OR ITCHING   Items with * indicate a potential emergency and should be followed up as soon as possible or go to the Emergency Department if any problems should occur.  Please show the CHEMOTHERAPY ALERT CARD or IMMUNOTHERAPY ALERT CARD at check-in to the Emergency  Department and triage nurse.  Should you have questions after your visit or need to cancel or reschedule your appointment, please contact [facility] [phone]  and follow the prompts.  Office hours are 8:00 a.m. to 4:30 p.m. Monday - Friday. Please note that voicemails left after 4:00 p.m. may not be returned until the following business day.  We are closed weekends and major holidays. You have access to a nurse at all times for urgent questions. Please call the main number to the clinic [phone] and follow the prompts.  For any non-urgent questions, you may also contact your provider using MyChart. We now offer e-Visits for anyone 85 and older to request care online for non-urgent symptoms. For details visit [email or web address].   Also download the MyChart app! Go to the app store, search "MyChart", open the app, select [facility], and log in with your MyChart username and password.  Due to Covid, a mask is required upon entering the hospital/clinic. If you do not have a mask, one will be given to you upon arrival. For doctor visits, patients may have 1 support person aged 85 or older with them. For treatment visits, patients cannot have anyone with them due to current Covid guidelines and our immunocompromised population.

## 2021-04-24 NOTE — Progress Notes (Signed)
Pt presents for D4 Vidaza per provider's order. Vital signs stable and pt voiced no new complaints at this time. Okay for treatment.  D4 Vidaza given today per MD orders. Tolerated infusion without adverse affects. Vital signs stable. No complaints at this time. Discharged from clinic in motorized chairin stable condition. Alert and oriented x 3. F/U with [facility] as scheduled.

## 2021-04-25 ENCOUNTER — Inpatient Hospital Stay (HOSPITAL_COMMUNITY): Payer: Medicare Other

## 2021-04-25 ENCOUNTER — Other Ambulatory Visit: Payer: Self-pay

## 2021-04-25 VITALS — BP 103/60 | HR 64 | Temp 97.3°F | Resp 20

## 2021-04-25 DIAGNOSIS — D469 Myelodysplastic syndrome, unspecified: Secondary | ICD-10-CM

## 2021-04-25 DIAGNOSIS — Z5111 Encounter for antineoplastic chemotherapy: Secondary | ICD-10-CM | POA: Diagnosis not present

## 2021-04-25 MED ORDER — SODIUM CHLORIDE 0.9 % IV SOLN: Freq: Once | INTRAVENOUS | Status: AC

## 2021-04-25 MED ORDER — SODIUM CHLORIDE 0.9% FLUSH
10.0000 mL | INTRAVENOUS | Status: DC | PRN
  Administered 2021-04-25: 10 mL

## 2021-04-25 MED ORDER — PALONOSETRON HCL INJECTION 0.25 MG/5ML
0.2500 mg | Freq: Once | INTRAVENOUS | Status: AC
  Administered 2021-04-25: 0.25 mg via INTRAVENOUS
  Filled 2021-04-25: qty 5

## 2021-04-25 MED ORDER — HEPARIN SOD (PORK) LOCK FLUSH 100 UNIT/ML IV SOLN
500.0000 [IU] | Freq: Once | INTRAVENOUS | Status: AC | PRN
  Administered 2021-04-25: 500 [IU]

## 2021-04-25 MED ORDER — SODIUM CHLORIDE 0.9 % IV SOLN
50.0000 mg/m2 | Freq: Once | INTRAVENOUS | Status: AC
  Administered 2021-04-25: 90 mg via INTRAVENOUS
  Filled 2021-04-25: qty 9

## 2021-04-25 MED ORDER — SODIUM CHLORIDE 0.9 % IV SOLN
10.0000 mg | Freq: Once | INTRAVENOUS | Status: AC
  Administered 2021-04-25: 10 mg via INTRAVENOUS
  Filled 2021-04-25: qty 10

## 2021-04-25 NOTE — Progress Notes (Signed)
Patient presents today for Vidaza day 5 per providers order.  Vital signs within parameters for treatment.  Patient has no new complaints at this time.  Vidaza given today per MD orders.  Stable during infusion without adverse affects.  Vital signs stable.  No complaints at this time.  Discharge from clinic via motorized wheelchair in stable condition.  Alert and oriented X 3.  Follow up with [facility] as scheduled.

## 2021-04-25 NOTE — Patient Instructions (Signed)
[facility]  Discharge Instructions: Thank you for choosing [facility] to provide your oncology and hematology care.  If you have a lab appointment with the Cancer Center, please come in thru the Main Entrance and check in at the main information desk.  Wear comfortable clothing and clothing appropriate for easy access to any Portacath or PICC line.   We strive to give you quality time with your provider. You may need to reschedule your appointment if you arrive late (15 or more minutes).  Arriving late affects you and other patients whose appointments are after yours.  Also, if you miss three or more appointments without notifying the office, you may be dismissed from the clinic at the provider's discretion.      For prescription refill requests, have your pharmacy contact our office and allow 72 hours for refills to be completed.    Today you received the following chemotherapy and/or immunotherapy agents Vidaza      To help prevent nausea and vomiting after your treatment, we encourage you to take your nausea medication as directed.  BELOW ARE SYMPTOMS THAT SHOULD BE REPORTED IMMEDIATELY: *FEVER GREATER THAN 100.4 F (38 C) OR HIGHER *CHILLS OR SWEATING *NAUSEA AND VOMITING THAT IS NOT CONTROLLED WITH YOUR NAUSEA MEDICATION *UNUSUAL SHORTNESS OF BREATH *UNUSUAL BRUISING OR BLEEDING *URINARY PROBLEMS (pain or burning when urinating, or frequent urination) *BOWEL PROBLEMS (unusual diarrhea, constipation, pain near the anus) TENDERNESS IN MOUTH AND THROAT WITH OR WITHOUT PRESENCE OF ULCERS (sore throat, sores in mouth, or a toothache) UNUSUAL RASH, SWELLING OR PAIN  UNUSUAL VAGINAL DISCHARGE OR ITCHING   Items with * indicate a potential emergency and should be followed up as soon as possible or go to the Emergency Department if any problems should occur.  Please show the CHEMOTHERAPY ALERT CARD or IMMUNOTHERAPY ALERT CARD at check-in to the Emergency  Department and triage nurse.  Should you have questions after your visit or need to cancel or reschedule your appointment, please contact [facility] [phone]  and follow the prompts.  Office hours are 8:00 a.m. to 4:30 p.m. Monday - Friday. Please note that voicemails left after 4:00 p.m. may not be returned until the following business day.  We are closed weekends and major holidays. You have access to a nurse at all times for urgent questions. Please call the main number to the clinic [phone] and follow the prompts.  For any non-urgent questions, you may also contact your provider using MyChart. We now offer e-Visits for anyone 18 and older to request care online for non-urgent symptoms. For details visit [email or web address].   Also download the MyChart app! Go to the app store, search "MyChart", open the app, select [facility], and log in with your MyChart username and password.  Due to Covid, a mask is required upon entering the hospital/clinic. If you do not have a mask, one will be given to you upon arrival. For doctor visits, patients may have 1 support person aged 18 or older with them. For treatment visits, patients cannot have anyone with them due to current Covid guidelines and our immunocompromised population.  

## 2021-04-28 ENCOUNTER — Inpatient Hospital Stay (HOSPITAL_COMMUNITY): Payer: Medicare Other

## 2021-04-28 ENCOUNTER — Other Ambulatory Visit: Payer: Self-pay

## 2021-04-28 DIAGNOSIS — Z5111 Encounter for antineoplastic chemotherapy: Secondary | ICD-10-CM | POA: Diagnosis not present

## 2021-04-28 DIAGNOSIS — D469 Myelodysplastic syndrome, unspecified: Secondary | ICD-10-CM

## 2021-04-28 DIAGNOSIS — D649 Anemia, unspecified: Secondary | ICD-10-CM

## 2021-04-28 LAB — SAMPLE TO BLOOD BANK

## 2021-04-28 LAB — CBC WITH DIFFERENTIAL/PLATELET
Abs Immature Granulocytes: 0.17 10*3/uL — ABNORMAL HIGH (ref 0.00–0.07)
Basophils Absolute: 0 10*3/uL (ref 0.0–0.1)
Basophils Relative: 0 %
Eosinophils Absolute: 0 10*3/uL (ref 0.0–0.5)
Eosinophils Relative: 0 %
HCT: 25 % — ABNORMAL LOW (ref 39.0–52.0)
Hemoglobin: 7.8 g/dL — ABNORMAL LOW (ref 13.0–17.0)
Immature Granulocytes: 2 %
Lymphocytes Relative: 8 %
Lymphs Abs: 0.7 10*3/uL (ref 0.7–4.0)
MCH: 26.2 pg (ref 26.0–34.0)
MCHC: 31.2 g/dL (ref 30.0–36.0)
MCV: 83.9 fL (ref 80.0–100.0)
Monocytes Absolute: 0.6 10*3/uL (ref 0.1–1.0)
Monocytes Relative: 7 %
Neutro Abs: 7 10*3/uL (ref 1.7–7.7)
Neutrophils Relative %: 83 %
Platelets: 83 10*3/uL — ABNORMAL LOW (ref 150–400)
RBC: 2.98 MIL/uL — ABNORMAL LOW (ref 4.22–5.81)
RDW: 19.4 % — ABNORMAL HIGH (ref 11.5–15.5)
WBC: 8.4 10*3/uL (ref 4.0–10.5)
nRBC: 0.6 % — ABNORMAL HIGH (ref 0.0–0.2)

## 2021-04-28 MED ORDER — SODIUM CHLORIDE 0.9% FLUSH
10.0000 mL | Freq: Once | INTRAVENOUS | Status: AC
  Administered 2021-04-28: 10 mL via INTRAVENOUS

## 2021-04-28 MED ORDER — HEPARIN SOD (PORK) LOCK FLUSH 100 UNIT/ML IV SOLN
500.0000 [IU] | Freq: Once | INTRAVENOUS | Status: AC
  Administered 2021-04-28: 500 [IU] via INTRAVENOUS

## 2021-04-28 NOTE — Progress Notes (Signed)
Patients port flushed without difficulty.  Good blood return noted with no bruising or swelling noted at site.  Patient remains accessed for possible blood transfusion.  

## 2021-04-28 NOTE — Progress Notes (Signed)
Pt's is here for possible blood today hbg is 7.8. Pt denies fatigue, weakness or SOB. Message sent to R.Pennington-PA and she stated no blood transfusion needed today. Pt made aware and discharged in stable condition.

## 2021-05-05 ENCOUNTER — Other Ambulatory Visit: Payer: Self-pay

## 2021-05-05 ENCOUNTER — Inpatient Hospital Stay (HOSPITAL_COMMUNITY): Payer: Medicare Other

## 2021-05-05 ENCOUNTER — Encounter (HOSPITAL_COMMUNITY): Payer: Self-pay

## 2021-05-05 VITALS — BP 110/64 | HR 68 | Temp 98.0°F | Resp 18

## 2021-05-05 DIAGNOSIS — D469 Myelodysplastic syndrome, unspecified: Secondary | ICD-10-CM

## 2021-05-05 DIAGNOSIS — Z5111 Encounter for antineoplastic chemotherapy: Secondary | ICD-10-CM | POA: Diagnosis not present

## 2021-05-05 DIAGNOSIS — D649 Anemia, unspecified: Secondary | ICD-10-CM

## 2021-05-05 LAB — COMPREHENSIVE METABOLIC PANEL
ALT: 13 U/L (ref 0–44)
AST: 18 U/L (ref 15–41)
Albumin: 3 g/dL — ABNORMAL LOW (ref 3.5–5.0)
Alkaline Phosphatase: 61 U/L (ref 38–126)
Anion gap: 7 (ref 5–15)
BUN: 34 mg/dL — ABNORMAL HIGH (ref 8–23)
CO2: 22 mmol/L (ref 22–32)
Calcium: 9 mg/dL (ref 8.9–10.3)
Chloride: 106 mmol/L (ref 98–111)
Creatinine, Ser: 2.01 mg/dL — ABNORMAL HIGH (ref 0.61–1.24)
GFR, Estimated: 31 mL/min — ABNORMAL LOW (ref >60)
Glucose, Bld: 97 mg/dL (ref 70–99)
Potassium: 3.5 mmol/L (ref 3.5–5.1)
Sodium: 135 mmol/L (ref 135–145)
Total Bilirubin: 0.8 mg/dL (ref 0.3–1.2)
Total Protein: 6.6 g/dL (ref 6.5–8.1)

## 2021-05-05 LAB — CBC WITH DIFFERENTIAL/PLATELET
Basophils Absolute: 0 10*3/uL (ref 0.0–0.1)
Basophils Relative: 0 %
Eosinophils Absolute: 0 10*3/uL (ref 0.0–0.5)
Eosinophils Relative: 0 %
HCT: 22.9 % — ABNORMAL LOW (ref 39.0–52.0)
Hemoglobin: 7 g/dL — ABNORMAL LOW (ref 13.0–17.0)
Lymphocytes Relative: 11 %
Lymphs Abs: 0.6 10*3/uL — ABNORMAL LOW (ref 0.7–4.0)
MCH: 25.2 pg — ABNORMAL LOW (ref 26.0–34.0)
MCHC: 30.6 g/dL (ref 30.0–36.0)
MCV: 82.4 fL (ref 80.0–100.0)
Metamyelocytes Relative: 2 %
Monocytes Absolute: 0.1 10*3/uL (ref 0.1–1.0)
Monocytes Relative: 1 %
Neutro Abs: 4.8 10*3/uL (ref 1.7–7.7)
Neutrophils Relative %: 86 %
Platelets: 29 10*3/uL — CL (ref 150–400)
RBC: 2.78 MIL/uL — ABNORMAL LOW (ref 4.22–5.81)
RDW: 19.8 % — ABNORMAL HIGH (ref 11.5–15.5)
WBC: 5.6 10*3/uL (ref 4.0–10.5)
nRBC: 0.4 % — ABNORMAL HIGH (ref 0.0–0.2)
nRBC: 2 /100 WBC — ABNORMAL HIGH

## 2021-05-05 LAB — SAMPLE TO BLOOD BANK

## 2021-05-05 LAB — PREPARE RBC (CROSSMATCH)

## 2021-05-05 MED ORDER — HEPARIN SOD (PORK) LOCK FLUSH 100 UNIT/ML IV SOLN
500.0000 [IU] | Freq: Every day | INTRAVENOUS | Status: AC | PRN
  Administered 2021-05-05: 12:00:00 500 [IU]

## 2021-05-05 MED ORDER — ACETAMINOPHEN 325 MG PO TABS
650.0000 mg | ORAL_TABLET | Freq: Once | ORAL | Status: AC
  Administered 2021-05-05: 10:00:00 650 mg via ORAL
  Filled 2021-05-05: qty 2

## 2021-05-05 MED ORDER — SODIUM CHLORIDE 0.9% FLUSH
10.0000 mL | INTRAVENOUS | Status: AC | PRN
  Administered 2021-05-05: 12:00:00 10 mL

## 2021-05-05 MED ORDER — SODIUM CHLORIDE 0.9% IV SOLUTION
250.0000 mL | Freq: Once | INTRAVENOUS | Status: AC
  Administered 2021-05-05: 10:00:00 250 mL via INTRAVENOUS

## 2021-05-05 MED ORDER — DIPHENHYDRAMINE HCL 25 MG PO CAPS
25.0000 mg | ORAL_CAPSULE | Freq: Once | ORAL | Status: AC
  Administered 2021-05-05: 10:00:00 25 mg via ORAL
  Filled 2021-05-05: qty 1

## 2021-05-05 NOTE — Progress Notes (Signed)
Patient presents today for possible blood transfusion. Patient reports feeling sore all over, congestion, patient's vitals within normal limits. Patient assessed at bedside by Tarri Abernethy, PA. Patient's hemoglobin 7.0, 1 unit of blood ordered per orders. Patient tolerated blood transfusion with no complaints voiced. Side effects with management reviewed with understanding verbalized. Port site clean and dry with no bruising or swelling noted at site. Good blood return noted before and after administration of therapy. Band aid applied. Patient left in satisfactory condition with VSS and no s/s of distress noted.

## 2021-05-05 NOTE — Progress Notes (Signed)
CRITICAL VALUE ALERT Critical value received:  platelets 29 Date of notification:  05/05/21  Time of notification: 09:17 Critical value read back:  Yes.   Nurse who received alert:  B Ares Tegtmeyer RN  MD notified time and response:  R Pennington PA/ Kris Mouton. Patient will receive 1 unit of blood today for HGB 7.1 and symptomatic. No platelets today per [place] PA and Dr. Delton Coombes standing orders.

## 2021-05-05 NOTE — Progress Notes (Signed)
Brief bedside visit performed with patient today.  He complains of feeling especially weak, "like he got hit with a train."  He finished his cycle of Vidaza last week, but feels worse than he usually does.  He complains of mild cough with some sputum production.  No shortness of breath beyond his baseline.  No fever.  He complains of some acute on chronic joint pain and weakness, especially in his neck.  Vitals are stable.  Lungs clear to auscultation bilaterally.  Cough during exam.  Patient getting labs checked today to see if he needs blood or fluids after his chemo last week.  We will see how he feels tomorrow - no additional intervention today.  If he feels any worse, he is instructed to proceed to Urgent Care or ED for  respiratory pathogen testing (flu, COVID, etc.), which we are unable to provide here.  Patient verbalizes agreement and understanding of the above plan.

## 2021-05-05 NOTE — Patient Instructions (Signed)
[facility]  Discharge Instructions: Thank you for choosing [facility] to provide your oncology and hematology care.  If you have a lab appointment with the [facility], please come in thru the Main Entrance and check in at the main information desk.  Wear comfortable clothing and clothing appropriate for easy access to any Portacath or PICC line.   We strive to give you quality time with your provider. You may need to reschedule your appointment if you arrive late (15 or more minutes).  Arriving late affects you and other patients whose appointments are after yours.  Also, if you miss three or more appointments without notifying the office, you may be dismissed from the clinic at the providers discretion.      For prescription refill requests, have your pharmacy contact our office and allow 72 hours for refills to be completed.    Today you received the following 1 unit of blood with no complaints voiced, return as scheduled.   To help prevent nausea and vomiting after your treatment, we encourage you to take your nausea medication as directed.  BELOW ARE SYMPTOMS THAT SHOULD BE REPORTED IMMEDIATELY: *FEVER GREATER THAN 100.4 F (38 C) OR HIGHER *CHILLS OR SWEATING *NAUSEA AND VOMITING THAT IS NOT CONTROLLED WITH YOUR NAUSEA MEDICATION *UNUSUAL SHORTNESS OF BREATH *UNUSUAL BRUISING OR BLEEDING *URINARY PROBLEMS (pain or burning when urinating, or frequent urination) *BOWEL PROBLEMS (unusual diarrhea, constipation, pain near the anus) TENDERNESS IN MOUTH AND THROAT WITH OR WITHOUT PRESENCE OF ULCERS (sore throat, sores in mouth, or a toothache) UNUSUAL RASH, SWELLING OR PAIN  UNUSUAL VAGINAL DISCHARGE OR ITCHING   Items with * indicate a potential emergency and should be followed up as soon as possible or go to the Emergency Department if any problems should occur.  Please show the CHEMOTHERAPY ALERT CARD or IMMUNOTHERAPY ALERT CARD at check-in to the  Emergency Department and triage nurse.  Should you have questions after your visit or need to cancel or reschedule your appointment, please contact [facility] [phone]  and follow the prompts.  Office hours are 8:00 a.m. to 4:30 p.m. Monday - Friday. Please note that voicemails left after 4:00 p.m. may not be returned until the following business day.  We are closed weekends and major holidays. You have access to a nurse at all times for urgent questions. Please call the main number to the clinic [phone] and follow the prompts.  For any non-urgent questions, you may also contact your provider using MyChart. We now offer e-Visits for anyone 39 and older to request care online for non-urgent symptoms. For details visit [email or web address].   Also download the MyChart app! Go to the app store, search "MyChart", open the app, select [facility], and log in with your MyChart username and password.  Due to Covid, a mask is required upon entering the hospital/clinic. If you do not have a mask, one will be given to you upon arrival. For doctor visits, patients may have 1 support person aged 59 or older with them. For treatment visits, patients cannot have anyone with them due to current Covid guidelines and our immunocompromised population.

## 2021-05-06 LAB — TYPE AND SCREEN
ABO/RH(D): B NEG
Antibody Screen: NEGATIVE
Unit division: 0

## 2021-05-06 LAB — BPAM RBC
Blood Product Expiration Date: 202301172359
ISSUE DATE / TIME: 202212191021
Unit Type and Rh: 1700

## 2021-05-06 NOTE — Progress Notes (Signed)
Called to follow up on patient per [place] PA verbal orders. Patient received one unit of blood on 05/05/21. Patient states, " I am feeling much better today and I didn't have to go to urgent care." Patient was treated last on 04/25/21 .

## 2021-05-13 ENCOUNTER — Other Ambulatory Visit: Payer: Self-pay

## 2021-05-13 ENCOUNTER — Inpatient Hospital Stay (HOSPITAL_COMMUNITY): Payer: Medicare Other

## 2021-05-13 ENCOUNTER — Encounter (HOSPITAL_COMMUNITY): Payer: Self-pay

## 2021-05-13 DIAGNOSIS — Z5111 Encounter for antineoplastic chemotherapy: Secondary | ICD-10-CM | POA: Diagnosis not present

## 2021-05-13 DIAGNOSIS — D649 Anemia, unspecified: Secondary | ICD-10-CM

## 2021-05-13 DIAGNOSIS — D469 Myelodysplastic syndrome, unspecified: Secondary | ICD-10-CM

## 2021-05-13 LAB — SAMPLE TO BLOOD BANK

## 2021-05-13 LAB — CBC WITH DIFFERENTIAL/PLATELET
Abs Immature Granulocytes: 0.14 10*3/uL — ABNORMAL HIGH (ref 0.00–0.07)
Basophils Absolute: 0 10*3/uL (ref 0.0–0.1)
Basophils Relative: 0 %
Eosinophils Absolute: 0 10*3/uL (ref 0.0–0.5)
Eosinophils Relative: 0 %
HCT: 25.4 % — ABNORMAL LOW (ref 39.0–52.0)
Hemoglobin: 7.9 g/dL — ABNORMAL LOW (ref 13.0–17.0)
Immature Granulocytes: 5 %
Lymphocytes Relative: 15 %
Lymphs Abs: 0.4 10*3/uL — ABNORMAL LOW (ref 0.7–4.0)
MCH: 25.7 pg — ABNORMAL LOW (ref 26.0–34.0)
MCHC: 31.1 g/dL (ref 30.0–36.0)
MCV: 82.7 fL (ref 80.0–100.0)
Monocytes Absolute: 0.3 10*3/uL (ref 0.1–1.0)
Monocytes Relative: 11 %
Neutro Abs: 2 10*3/uL (ref 1.7–7.7)
Neutrophils Relative %: 69 %
Platelets: 24 10*3/uL — CL (ref 150–400)
RBC: 3.07 MIL/uL — ABNORMAL LOW (ref 4.22–5.81)
RDW: 19.2 % — ABNORMAL HIGH (ref 11.5–15.5)
WBC: 2.9 10*3/uL — ABNORMAL LOW (ref 4.0–10.5)
nRBC: 1 % — ABNORMAL HIGH (ref 0.0–0.2)

## 2021-05-13 MED ORDER — HEPARIN SOD (PORK) LOCK FLUSH 100 UNIT/ML IV SOLN
500.0000 [IU] | Freq: Once | INTRAVENOUS | Status: AC
  Administered 2021-05-13: 10:00:00 500 [IU] via INTRAVENOUS

## 2021-05-13 MED ORDER — SODIUM CHLORIDE 0.9% FLUSH
10.0000 mL | Freq: Once | INTRAVENOUS | Status: AC
  Administered 2021-05-13: 10:00:00 10 mL via INTRAVENOUS

## 2021-05-13 NOTE — Progress Notes (Signed)
CRITICAL VALUE ALERT Critical value received:  platelets 24 Date of notification:  05/13/21 Time of notification: 0922 Critical value read back:  Yes.   Nurse who received alert:  C. Ashantia Amaral RN MD notified time and response:  [phone]   No new orders at this time.   Discharged home from clinic via wheelchair. Follow up as scheduled.

## 2021-05-20 ENCOUNTER — Other Ambulatory Visit: Payer: Self-pay

## 2021-05-20 ENCOUNTER — Inpatient Hospital Stay (HOSPITAL_COMMUNITY): Payer: Medicare Other

## 2021-05-20 ENCOUNTER — Inpatient Hospital Stay (HOSPITAL_COMMUNITY): Payer: Medicare Other | Attending: Hematology

## 2021-05-20 VITALS — BP 103/60 | HR 75 | Temp 97.3°F | Resp 18

## 2021-05-20 DIAGNOSIS — R0602 Shortness of breath: Secondary | ICD-10-CM | POA: Insufficient documentation

## 2021-05-20 DIAGNOSIS — Z79899 Other long term (current) drug therapy: Secondary | ICD-10-CM | POA: Diagnosis not present

## 2021-05-20 DIAGNOSIS — R5383 Other fatigue: Secondary | ICD-10-CM | POA: Insufficient documentation

## 2021-05-20 DIAGNOSIS — N1832 Chronic kidney disease, stage 3b: Secondary | ICD-10-CM | POA: Diagnosis not present

## 2021-05-20 DIAGNOSIS — D61818 Other pancytopenia: Secondary | ICD-10-CM | POA: Insufficient documentation

## 2021-05-20 DIAGNOSIS — Z841 Family history of disorders of kidney and ureter: Secondary | ICD-10-CM | POA: Insufficient documentation

## 2021-05-20 DIAGNOSIS — D649 Anemia, unspecified: Secondary | ICD-10-CM

## 2021-05-20 DIAGNOSIS — J449 Chronic obstructive pulmonary disease, unspecified: Secondary | ICD-10-CM | POA: Insufficient documentation

## 2021-05-20 DIAGNOSIS — Z87891 Personal history of nicotine dependence: Secondary | ICD-10-CM | POA: Insufficient documentation

## 2021-05-20 DIAGNOSIS — G479 Sleep disorder, unspecified: Secondary | ICD-10-CM | POA: Diagnosis not present

## 2021-05-20 DIAGNOSIS — D469 Myelodysplastic syndrome, unspecified: Secondary | ICD-10-CM | POA: Diagnosis not present

## 2021-05-20 LAB — CBC WITH DIFFERENTIAL/PLATELET
Abs Immature Granulocytes: 0.05 10*3/uL (ref 0.00–0.07)
Basophils Absolute: 0 10*3/uL (ref 0.0–0.1)
Basophils Relative: 0 %
Eosinophils Absolute: 0 10*3/uL (ref 0.0–0.5)
Eosinophils Relative: 0 %
HCT: 24.2 % — ABNORMAL LOW (ref 39.0–52.0)
Hemoglobin: 7.5 g/dL — ABNORMAL LOW (ref 13.0–17.0)
Immature Granulocytes: 2 %
Lymphocytes Relative: 23 %
Lymphs Abs: 0.6 10*3/uL — ABNORMAL LOW (ref 0.7–4.0)
MCH: 25.5 pg — ABNORMAL LOW (ref 26.0–34.0)
MCHC: 31 g/dL (ref 30.0–36.0)
MCV: 82.3 fL (ref 80.0–100.0)
Monocytes Absolute: 0.3 10*3/uL (ref 0.1–1.0)
Monocytes Relative: 10 %
Neutro Abs: 1.7 10*3/uL (ref 1.7–7.7)
Neutrophils Relative %: 65 %
Platelets: 35 10*3/uL — ABNORMAL LOW (ref 150–400)
RBC: 2.94 MIL/uL — ABNORMAL LOW (ref 4.22–5.81)
RDW: 18.1 % — ABNORMAL HIGH (ref 11.5–15.5)
WBC Morphology: REACTIVE
WBC: 2.6 10*3/uL — ABNORMAL LOW (ref 4.0–10.5)
nRBC: 0.8 % — ABNORMAL HIGH (ref 0.0–0.2)

## 2021-05-20 LAB — SAMPLE TO BLOOD BANK

## 2021-05-20 LAB — PREPARE RBC (CROSSMATCH)

## 2021-05-20 MED ORDER — HEPARIN SOD (PORK) LOCK FLUSH 100 UNIT/ML IV SOLN
500.0000 [IU] | Freq: Every day | INTRAVENOUS | Status: AC | PRN
  Administered 2021-05-20: 500 [IU]

## 2021-05-20 MED ORDER — DIPHENHYDRAMINE HCL 25 MG PO CAPS
25.0000 mg | ORAL_CAPSULE | Freq: Once | ORAL | Status: AC
  Administered 2021-05-20: 25 mg via ORAL
  Filled 2021-05-20: qty 1

## 2021-05-20 MED ORDER — SODIUM CHLORIDE 0.9% IV SOLUTION
250.0000 mL | Freq: Once | INTRAVENOUS | Status: AC
  Administered 2021-05-20: 250 mL via INTRAVENOUS

## 2021-05-20 MED ORDER — SODIUM CHLORIDE 0.9% FLUSH
10.0000 mL | INTRAVENOUS | Status: AC | PRN
  Administered 2021-05-20: 10 mL

## 2021-05-20 MED ORDER — ACETAMINOPHEN 325 MG PO TABS
650.0000 mg | ORAL_TABLET | Freq: Once | ORAL | Status: AC
  Administered 2021-05-20: 650 mg via ORAL
  Filled 2021-05-20: qty 2

## 2021-05-20 NOTE — Patient Instructions (Signed)
[facility]  Discharge Instructions: Thank you for choosing [facility] to provide your oncology and hematology care.  If you have a lab appointment with the Cancer Center, please come in thru the Main Entrance and check in at the main information desk.  Wear comfortable clothing and clothing appropriate for easy access to any Portacath or PICC line.   We strive to give you quality time with your provider. You may need to reschedule your appointment if you arrive late (15 or more minutes).  Arriving late affects you and other patients whose appointments are after yours.  Also, if you miss three or more appointments without notifying the office, you may be dismissed from the clinic at the provider's discretion.      For prescription refill requests, have your pharmacy contact our office and allow 72 hours for refills to be completed.        To help prevent nausea and vomiting after your treatment, we encourage you to take your nausea medication as directed.  BELOW ARE SYMPTOMS THAT SHOULD BE REPORTED IMMEDIATELY: *FEVER GREATER THAN 100.4 F (38 C) OR HIGHER *CHILLS OR SWEATING *NAUSEA AND VOMITING THAT IS NOT CONTROLLED WITH YOUR NAUSEA MEDICATION *UNUSUAL SHORTNESS OF BREATH *UNUSUAL BRUISING OR BLEEDING *URINARY PROBLEMS (pain or burning when urinating, or frequent urination) *BOWEL PROBLEMS (unusual diarrhea, constipation, pain near the anus) TENDERNESS IN MOUTH AND THROAT WITH OR WITHOUT PRESENCE OF ULCERS (sore throat, sores in mouth, or a toothache) UNUSUAL RASH, SWELLING OR PAIN  UNUSUAL VAGINAL DISCHARGE OR ITCHING   Items with * indicate a potential emergency and should be followed up as soon as possible or go to the Emergency Department if any problems should occur.  Please show the CHEMOTHERAPY ALERT CARD or IMMUNOTHERAPY ALERT CARD at check-in to the Emergency Department and triage nurse.  Should you have questions after your visit or need to cancel  or reschedule your appointment, please contact [facility] [phone]  and follow the prompts.  Office hours are 8:00 a.m. to 4:30 p.m. Monday - Friday. Please note that voicemails left after 4:00 p.m. may not be returned until the following business day.  We are closed weekends and major holidays. You have access to a nurse at all times for urgent questions. Please call the main number to the clinic [phone] and follow the prompts.  For any non-urgent questions, you may also contact your provider using MyChart. We now offer e-Visits for anyone 18 and older to request care online for non-urgent symptoms. For details visit [email or web address].   Also download the MyChart app! Go to the app store, search "MyChart", open the app, select [facility], and log in with your MyChart username and password.  Due to Covid, a mask is required upon entering the hospital/clinic. If you do not have a mask, one will be given to you upon arrival. For doctor visits, patients may have 1 support person aged 18 or older with them. For treatment visits, patients cannot have anyone with them due to current Covid guidelines and our immunocompromised population.  

## 2021-05-20 NOTE — Progress Notes (Signed)
Patients port flushed without difficulty.  Good blood return noted with no bruising or swelling noted at site.  Labs collected from port.  Patient remains accessed for possible blood transfusion.

## 2021-05-20 NOTE — Progress Notes (Signed)
Patient presents today for possible blood transfusion.  Patient is in satisfactory condition.  Patient c/o dizziness and vomiting yesterday, but symptoms have resolved today.  Vital signs are stable.    Hgb today is 7.5.  Platelets are 35.  We will give one unit of PRBC today per Dr. Delton Coombes.  Orders put in and released by Benjiman Core, RN.  Patient tolerated blood transfusion well with no complaints voiced.  Patient left via wheelchair in stable condition.  Vital signs stable at discharge.  Follow up as scheduled.

## 2021-05-21 LAB — BPAM RBC
Blood Product Expiration Date: 202301202359
ISSUE DATE / TIME: 202301031102
Unit Type and Rh: 1700

## 2021-05-21 LAB — TYPE AND SCREEN
ABO/RH(D): B NEG
Antibody Screen: NEGATIVE
Unit division: 0

## 2021-05-23 ENCOUNTER — Other Ambulatory Visit: Payer: Self-pay | Admitting: Radiology

## 2021-05-23 ENCOUNTER — Other Ambulatory Visit: Payer: Self-pay | Admitting: Student

## 2021-05-26 ENCOUNTER — Ambulatory Visit (HOSPITAL_COMMUNITY)
Admission: RE | Admit: 2021-05-26 | Discharge: 2021-05-26 | Disposition: A | Discharge status: Home / Self Care | Payer: Medicare Other | Source: Ambulatory Visit | Attending: Hematology | Admitting: Hematology

## 2021-05-26 ENCOUNTER — Other Ambulatory Visit: Payer: Self-pay

## 2021-05-26 ENCOUNTER — Encounter (HOSPITAL_COMMUNITY): Payer: Medicare Other

## 2021-05-26 ENCOUNTER — Other Ambulatory Visit (HOSPITAL_COMMUNITY): Payer: Medicare Other

## 2021-05-26 ENCOUNTER — Encounter (HOSPITAL_COMMUNITY): Payer: Self-pay

## 2021-05-26 DIAGNOSIS — J449 Chronic obstructive pulmonary disease, unspecified: Secondary | ICD-10-CM | POA: Insufficient documentation

## 2021-05-26 DIAGNOSIS — K219 Gastro-esophageal reflux disease without esophagitis: Secondary | ICD-10-CM | POA: Diagnosis not present

## 2021-05-26 DIAGNOSIS — N189 Chronic kidney disease, unspecified: Secondary | ICD-10-CM | POA: Diagnosis not present

## 2021-05-26 DIAGNOSIS — D61818 Other pancytopenia: Secondary | ICD-10-CM | POA: Diagnosis not present

## 2021-05-26 DIAGNOSIS — D469 Myelodysplastic syndrome, unspecified: Secondary | ICD-10-CM | POA: Diagnosis not present

## 2021-05-26 LAB — CBC WITH DIFFERENTIAL/PLATELET
Abs Immature Granulocytes: 0.05 10*3/uL (ref 0.00–0.07)
Basophils Absolute: 0 10*3/uL (ref 0.0–0.1)
Basophils Relative: 0 %
Eosinophils Absolute: 0 10*3/uL (ref 0.0–0.5)
Eosinophils Relative: 0 %
HCT: 30.8 % — ABNORMAL LOW (ref 39.0–52.0)
Hemoglobin: 9.5 g/dL — ABNORMAL LOW (ref 13.0–17.0)
Immature Granulocytes: 2 %
Lymphocytes Relative: 21 %
Lymphs Abs: 0.5 10*3/uL — ABNORMAL LOW (ref 0.7–4.0)
MCH: 25.9 pg — ABNORMAL LOW (ref 26.0–34.0)
MCHC: 30.8 g/dL (ref 30.0–36.0)
MCV: 83.9 fL (ref 80.0–100.0)
Monocytes Absolute: 0.3 10*3/uL (ref 0.1–1.0)
Monocytes Relative: 12 %
Neutro Abs: 1.6 10*3/uL — ABNORMAL LOW (ref 1.7–7.7)
Neutrophils Relative %: 65 %
Platelets: 37 10*3/uL — ABNORMAL LOW (ref 150–400)
RBC: 3.67 MIL/uL — ABNORMAL LOW (ref 4.22–5.81)
RDW: 18.6 % — ABNORMAL HIGH (ref 11.5–15.5)
WBC: 2.4 10*3/uL — ABNORMAL LOW (ref 4.0–10.5)
nRBC: 1.2 % — ABNORMAL HIGH (ref 0.0–0.2)

## 2021-05-26 MED ORDER — MIDAZOLAM HCL 2 MG/2ML IJ SOLN
INTRAMUSCULAR | Status: AC | PRN
  Administered 2021-05-26: .5 mg via INTRAVENOUS

## 2021-05-26 MED ORDER — FENTANYL CITRATE (PF) 100 MCG/2ML IJ SOLN
INTRAMUSCULAR | Status: AC | PRN
Start: 2021-05-26 — End: 2021-05-26
  Administered 2021-05-26: 25 ug via INTRAVENOUS

## 2021-05-26 MED ORDER — FLUMAZENIL 0.5 MG/5ML IV SOLN
INTRAVENOUS | Status: AC
  Filled 2021-05-26: qty 5

## 2021-05-26 MED ORDER — FENTANYL CITRATE (PF) 100 MCG/2ML IJ SOLN
INTRAMUSCULAR | Status: AC
  Filled 2021-05-26: qty 2

## 2021-05-26 MED ORDER — LIDOCAINE HCL (PF) 1 % IJ SOLN
INTRAMUSCULAR | Status: AC | PRN
  Administered 2021-05-26: 10 mL via INTRADERMAL

## 2021-05-26 MED ORDER — MIDAZOLAM HCL 2 MG/2ML IJ SOLN
INTRAMUSCULAR | Status: AC
  Filled 2021-05-26: qty 2

## 2021-05-26 MED ORDER — NALOXONE HCL 0.4 MG/ML IJ SOLN
INTRAMUSCULAR | Status: AC
  Filled 2021-05-26: qty 1

## 2021-05-26 MED ORDER — SODIUM CHLORIDE 0.9 % IV SOLN: INTRAVENOUS | Status: DC

## 2021-05-26 NOTE — Consult Note (Signed)
Chief Complaint: Patient was seen in consultation today for CT guided bone marrow biopsy  Referring Physician(s): Katragadda,Sreedhar  Supervising Physician: Juliet Rude  Patient Status: [facility] - Out-pt  History of Present Illness: [name] is an 88 y.o. male with PMH sig for CKD, COPD,GERD and high risk MDS with pancytopenia. He presents today for CT guided BM bx for further evaluation.   Past Medical History:  Diagnosis Date   Bone cancer ([place])    Chronic kidney insufficiency    COPD (chronic obstructive pulmonary disease) ([facility])    GERD (gastroesophageal reflux disease)    Pulmonary nodule     Past Surgical History:  Procedure Laterality Date   BACK SURGERY     CATARACT EXTRACTION W/PHACO Right 03/09/2016   Procedure: CATARACT EXTRACTION PHACO AND INTRAOCULAR LENS PLACEMENT RIGHT EYE CDE=8.58;  Surgeon: Tonny Branch, MD;  Location: [facility];  Service: Ophthalmology;  Laterality: Right;  right   CATARACT EXTRACTION W/PHACO Left 04/13/2016   Procedure: CATARACT EXTRACTION PHACO AND INTRAOCULAR LENS PLACEMENT (IOC);  Surgeon: Tonny Branch, MD;  Location: [facility];  Service: Ophthalmology;  Laterality: Left;  CDE: 8.08   KYPHOPLASTY Bilateral 06/09/2019   Procedure: T11 KYPHOPLASTY;  Surgeon: Consuella Lose, MD;  Location: [facility];  Service: Neurosurgery;  Laterality: Bilateral;   None to Date  10/01/15   PORTACATH PLACEMENT Left 10/11/2020   Procedure: INSERTION PORT-A-CATH (attached catheter in left internal jugular);  Surgeon: Virl Cagey, MD;  Location: [facility];  Service: General;  Laterality: Left;    Allergies: Patient has no known allergies.  Medications: Prior to Admission medications   Medication Sig Start Date End Date Taking? Authorizing Provider  acetaminophen (TYLENOL) 325 MG tablet Take 2 tablets (650 mg total) by mouth every 6 (six) hours as needed for mild pain, fever or headache (or Fever >/= 101). 04/04/20   Roxan Hockey, MD  albuterol  (VENTOLIN HFA) 108 (90 Base) MCG/ACT inhaler Inhale 2 puffs into the lungs every 4 (four) hours as needed for wheezing or shortness of breath. 04/04/20   Roxan Hockey, MD  cefdinir (OMNICEF) 300 MG capsule Take 1 capsule (300 mg total) by mouth daily. For UTI 02/24/21   Tarri Abernethy M, PA-C  megestrol (MEGACE) 400 MG/10ML suspension Take 10 mLs (400 mg total) by mouth 2 (two) times daily. 02/10/21   Derek Jack, MD  nitrofurantoin, macrocrystal-monohydrate, (MACROBID) 100 MG capsule Take 1 capsule (100 mg total) by mouth every 12 (twelve) hours. 03/26/21   McKenzie, Candee Furbish, MD  ondansetron (ZOFRAN) 4 MG tablet Take 1 tablet (4 mg total) by mouth every 6 (six) hours as needed for nausea or vomiting. 6/37/85   Delora Fuel, MD  pantoprazole (PROTONIX) 40 MG tablet Take 40 mg by mouth daily. Pt no longer taking 06/21/19   [provider]  predniSONE (DELTASONE) 10 MG tablet Take 10 mg by mouth daily with breakfast.    [provider]  prochlorperazine (COMPAZINE) 10 MG tablet Take 1 tablet (10 mg total) by mouth every 6 (six) hours as needed for nausea or vomiting. 09/13/20   Derek Jack, MD  silodosin (RAPAFLO) 4 MG CAPS capsule Take 1 capsule (4 mg total) by mouth at bedtime. 04/14/21   Derek Jack, MD  tiotropium (SPIRIVA HANDIHALER) 18 MCG inhalation capsule Place 1 capsule (18 mcg total) into inhaler and inhale daily. 04/04/20   Roxan Hockey, MD     Family History  Problem Relation Age of Onset   Kidney disease Mother  Kidney disease Sister    Colon cancer Neg Hx    Gastric cancer Neg Hx    Esophageal cancer Neg Hx     Social History   Socioeconomic History   Marital status: Widowed    Spouse name: Not on file   Number of children: Not on file   Years of education: Not on file   Highest education level: Not on file  Occupational History   Not on file  Tobacco Use   Smoking status: Former    Years: 15.00    Types:  Cigarettes, Cigars    Quit date: 10/01/1990    Years since quitting: 30.6   Smokeless tobacco: Never   Tobacco comments:    Quit x 25-30 years; 2 cigars daily when smoked  Vaping Use   Vaping Use: Never used  Substance and Sexual Activity   Alcohol use: Not Currently    Alcohol/week: 0.0 standard drinks   Drug use: No   Sexual activity: Never    Birth control/protection: None  Other Topics Concern   Not on file  Social History Narrative   Not on file   Social Determinants of Health   Financial Resource Strain: Not on file  Food Insecurity: Not on file  Transportation Needs: Not on file  Physical Activity: Not on file  Stress: Not on file  Social Connections: Not on file      Review of Systems denies fever,HA,CP, cough, abd/back pain,N/V or bleeding; he does have some chronic dyspnea  Vital Signs: BP 118/73    Pulse 83    Temp 97.6 F (36.4 C) (Oral)    Resp 20    SpO2 98%   Physical Exam awake/alert; thin WM in NAD; chest- distant BS bilat; left chest wall PAC present; heart- RRR; abd- soft,+BS,NT;no LE edema  Imaging: No results found.  Labs:  CBC: Recent Labs    04/28/21 0744 05/05/21 0807 05/13/21 0832 05/20/21 0828  WBC 8.4 5.6 2.9* 2.6*  HGB 7.8* 7.0* 7.9* 7.5*  HCT 25.0* 22.9* 25.4* 24.2*  PLT 83* 29* 24* 35*    COAGS: Recent Labs    11/11/20 1125  INR 1.1    BMP: Recent Labs    03/24/21 0804 04/14/21 0803 04/21/21 0834 05/05/21 0807  NA 136 136 136 135  K 3.6 3.8 3.5 3.5  CL 106 106 107 106  CO2 24 22 21* 22  GLUCOSE 107* 95 92 97  BUN 36* 35* 39* 34*  CALCIUM 8.9 9.8 9.6 9.0  CREATININE 1.74* 2.05* 2.03* 2.01*  GFRNONAA 37* 30* 31* 31*    LIVER FUNCTION TESTS: Recent Labs    03/24/21 0804 04/14/21 0803 04/21/21 0834 05/05/21 0807  BILITOT 0.8 0.9 0.4 0.8  AST _0 ALT _1 ALKPHOS 68 68 68 61  PROT 6.7 6.5 6.4* 6.6  ALBUMIN 3.3* 3.2* 3.2* 3.0*    TUMOR MARKERS: No results for input(s): AFPTM, CEA,  CA199, CHROMGRNA in the last 8760 hours.  Assessment and Plan: 88 y.o. male with PMH sig for CKD, COPD,GERD and high risk MDS with pancytopenia. He presents today for CT guided BM bx for further evaluation. Risks and benefits of procedure was discussed with the patient including, but not limited to bleeding, infection, damage to adjacent structures or low yield requiring additional tests.  All of the questions were answered and there is agreement to proceed.  Consent signed and in chart.    Thank you for this interesting  consult.  I greatly enjoyed meeting [name] and look forward to participating in their care.  A copy of this report was sent to the requesting provider on this date.  Electronically Signed: D. Rowe Robert, PA-C 05/26/2021, 8:25 AM   I spent a total of  20 minutes   in face to face in clinical consultation, greater than 50% of which was counseling/coordinating care for CT guided bone marrow biopsy

## 2021-05-26 NOTE — Procedures (Signed)
Interventional Radiology Procedure Note  Date of Procedure: 05/26/2021  Procedure: BMBx  Findings:  1. Right BMBx   Complications: No immediate complications noted.   Estimated Blood Loss: minimal  Follow-up and Recommendations: 1. Bedrest 1 hour    Albin Felling, MD  Vascular & Interventional Radiology  05/26/2021 10:08 AM

## 2021-05-26 NOTE — Discharge Instructions (Signed)
For questions /concerns may call Interventional Radiology at [phone]  You may remove your dressing and shower tomorrow afternoon    Bone Marrow Aspiration and Bone Marrow Biopsy, Adult, Care After This sheet gives you information about how to care for yourself after your procedure. Your health care provider may also give you more specific instructions. If you have problems or questions, contact your health careprovider. What can I expect after the procedure? After the procedure, it is common to have: Mild pain and tenderness. Swelling. Bruising. Follow these instructions at home: Puncture site care Follow instructions from your health care provider about how to take care of the puncture site. Make sure you: Wash your hands with soap and water before and after you change your bandage (dressing). If soap and water are not available, use hand sanitizer. Change your dressing as told by your health care provider. Check your puncture site every day for signs of infection. Check for: More redness, swelling, or pain. Fluid or blood. Warmth. Pus or a bad smell.  Activity Return to your normal activities as told by your health care provider. Ask your health care provider what activities are safe for you. Do not lift anything that is heavier than 10 lb (4.5 kg), or the limit that you are told, until your health care provider says that it is safe. Do not drive for 24 hours if you were given a sedative during your procedure. General instructions Take over-the-counter and prescription medicines only as told by your health care provider. Do not take baths, swim, or use a hot tub until your health care provider approves. Ask your health care provider if you may take showers. You may only be allowed to take sponge baths. If directed, put ice on the affected area. To do this: Put ice in a plastic bag. Place a towel between your skin and the bag. Leave the ice on for 20 minutes, 2-3 times a  day. Keep all follow-up visits as told by your health care provider. This is important.  Contact a health care provider if: Your pain is not controlled with medicine. You have a fever. You have more redness, swelling, or pain around the puncture site. You have fluid or blood coming from the puncture site. Your puncture site feels warm to the touch. You have pus or a bad smell coming from the puncture site. Summary After the procedure, it is common to have mild pain, tenderness, swelling, and bruising. Follow instructions from your health care provider about how to take care of the puncture site and what activities are safe for you. Take over-the-counter and prescription medicines only as told by your health care provider. Contact a health care provider if you have any signs of infection, such as fluid or blood coming from the puncture site. This information is not intended to replace advice given to you by your health care provider. Make sure you discuss any questions you have with your healthcare provider.   Moderate Conscious Sedation, Adult, Care After This sheet gives you information about how to care for yourself after your procedure. Your health care provider may also give you more specific instructions. If you have problems or questions, contact your health careprovider. What can I expect after the procedure? After the procedure, it is common to have: Sleepiness for several hours. Impaired judgment for several hours. Difficulty with balance. Vomiting if you eat too soon. Follow these instructions at home: For the time period you were told by your health care provider:  Rest. Do not participate in activities where you could fall or become injured. Do not drive or use machinery. Do not drink alcohol. Do not take sleeping pills or medicines that cause drowsiness. Do not make important decisions or sign legal documents. Do not take care of children on your own. Eating and  drinking  Follow the diet recommended by your health care provider. Drink enough fluid to keep your urine pale yellow. If you vomit: Drink water, juice, or soup when you can drink without vomiting. Make sure you have little or no nausea before eating solid foods.  General instructions Take over-the-counter and prescription medicines only as told by your health care provider. Have a responsible adult stay with you for the time you are told. It is important to have someone help care for you until you are awake and alert. Do not smoke. Keep all follow-up visits as told by your health care provider. This is important. Contact a health care provider if: You are still sleepy or having trouble with balance after 24 hours. You feel light-headed. You keep feeling nauseous or you keep vomiting. You develop a rash. You have a fever. You have redness or swelling around the IV site. Get help right away if: You have trouble breathing. You have new-onset confusion at home. Summary After the procedure, it is common to feel sleepy, have impaired judgment, or feel nauseous if you eat too soon. Rest after you get home. Know the things you should not do after the procedure. Follow the diet recommended by your health care provider and drink enough fluid to keep your urine pale yellow. Get help right away if you have trouble breathing or new-onset confusion at home. This information is not intended to replace advice given to you by your health care provider. Make sure you discuss any questions you have with your healthcare provider. Document Revised: 09/01/2019 Document Reviewed: 03/30/2019 Elsevier Patient Education  2022 [employer].

## 2021-05-28 LAB — SURGICAL PATHOLOGY

## 2021-05-29 ENCOUNTER — Encounter (HOSPITAL_COMMUNITY): Payer: Self-pay | Admitting: Certified Registered"

## 2021-06-02 ENCOUNTER — Inpatient Hospital Stay (HOSPITAL_COMMUNITY): Payer: Medicare Other

## 2021-06-02 ENCOUNTER — Encounter (HOSPITAL_COMMUNITY): Payer: Self-pay | Admitting: Hematology

## 2021-06-02 ENCOUNTER — Other Ambulatory Visit (HOSPITAL_COMMUNITY): Payer: Medicare Other

## 2021-06-02 ENCOUNTER — Other Ambulatory Visit: Payer: Self-pay

## 2021-06-02 ENCOUNTER — Inpatient Hospital Stay (HOSPITAL_COMMUNITY): Payer: Medicare Other | Admitting: Dietician

## 2021-06-02 ENCOUNTER — Ambulatory Visit (HOSPITAL_COMMUNITY): Payer: Medicare Other | Admitting: Hematology

## 2021-06-02 ENCOUNTER — Other Ambulatory Visit (HOSPITAL_COMMUNITY): Payer: Self-pay | Admitting: *Deleted

## 2021-06-02 ENCOUNTER — Inpatient Hospital Stay (HOSPITAL_BASED_OUTPATIENT_CLINIC_OR_DEPARTMENT_OTHER): Payer: Medicare Other | Admitting: Hematology

## 2021-06-02 DIAGNOSIS — D649 Anemia, unspecified: Secondary | ICD-10-CM | POA: Diagnosis not present

## 2021-06-02 DIAGNOSIS — N1832 Chronic kidney disease, stage 3b: Secondary | ICD-10-CM | POA: Diagnosis not present

## 2021-06-02 DIAGNOSIS — D469 Myelodysplastic syndrome, unspecified: Secondary | ICD-10-CM | POA: Diagnosis not present

## 2021-06-02 DIAGNOSIS — G479 Sleep disorder, unspecified: Secondary | ICD-10-CM | POA: Diagnosis not present

## 2021-06-02 DIAGNOSIS — D61818 Other pancytopenia: Secondary | ICD-10-CM | POA: Diagnosis not present

## 2021-06-02 DIAGNOSIS — R5383 Other fatigue: Secondary | ICD-10-CM | POA: Diagnosis not present

## 2021-06-02 DIAGNOSIS — R0602 Shortness of breath: Secondary | ICD-10-CM | POA: Diagnosis not present

## 2021-06-02 LAB — COMPREHENSIVE METABOLIC PANEL
ALT: 13 U/L (ref 0–44)
AST: 19 U/L (ref 15–41)
Albumin: 3.3 g/dL — ABNORMAL LOW (ref 3.5–5.0)
Alkaline Phosphatase: 67 U/L (ref 38–126)
Anion gap: 5 (ref 5–15)
BUN: 42 mg/dL — ABNORMAL HIGH (ref 8–23)
CO2: 21 mmol/L — ABNORMAL LOW (ref 22–32)
Calcium: 9.4 mg/dL (ref 8.9–10.3)
Chloride: 108 mmol/L (ref 98–111)
Creatinine, Ser: 2.11 mg/dL — ABNORMAL HIGH (ref 0.61–1.24)
GFR, Estimated: 29 mL/min — ABNORMAL LOW (ref >60)
Glucose, Bld: 94 mg/dL (ref 70–99)
Potassium: 3.5 mmol/L (ref 3.5–5.1)
Sodium: 134 mmol/L — ABNORMAL LOW (ref 135–145)
Total Bilirubin: 0.7 mg/dL (ref 0.3–1.2)
Total Protein: 6.6 g/dL (ref 6.5–8.1)

## 2021-06-02 LAB — CBC WITH DIFFERENTIAL/PLATELET
Basophils Absolute: 0 10*3/uL (ref 0.0–0.1)
Basophils Relative: 0 %
Eosinophils Absolute: 0 10*3/uL (ref 0.0–0.5)
Eosinophils Relative: 0 %
HCT: 25.7 % — ABNORMAL LOW (ref 39.0–52.0)
Hemoglobin: 8.1 g/dL — ABNORMAL LOW (ref 13.0–17.0)
Lymphocytes Relative: 22 %
Lymphs Abs: 0.6 10*3/uL — ABNORMAL LOW (ref 0.7–4.0)
MCH: 26 pg (ref 26.0–34.0)
MCHC: 31.5 g/dL (ref 30.0–36.0)
MCV: 82.6 fL (ref 80.0–100.0)
Monocytes Absolute: 0.1 10*3/uL (ref 0.1–1.0)
Monocytes Relative: 5 %
Neutro Abs: 2 10*3/uL (ref 1.7–7.7)
Neutrophils Relative %: 73 %
Platelets: 41 10*3/uL — ABNORMAL LOW (ref 150–400)
RBC: 3.11 MIL/uL — ABNORMAL LOW (ref 4.22–5.81)
RDW: 18.9 % — ABNORMAL HIGH (ref 11.5–15.5)
WBC: 2.8 10*3/uL — ABNORMAL LOW (ref 4.0–10.5)
nRBC: 0.7 % — ABNORMAL HIGH (ref 0.0–0.2)

## 2021-06-02 LAB — LACTATE DEHYDROGENASE: LDH: 142 U/L (ref 98–192)

## 2021-06-02 LAB — SAMPLE TO BLOOD BANK

## 2021-06-02 LAB — MAGNESIUM: Magnesium: 1.9 mg/dL (ref 1.7–2.4)

## 2021-06-02 MED ORDER — HEPARIN SOD (PORK) LOCK FLUSH 100 UNIT/ML IV SOLN
500.0000 [IU] | Freq: Once | INTRAVENOUS | Status: AC
  Administered 2021-06-02: 500 [IU] via INTRAVENOUS

## 2021-06-02 MED ORDER — SODIUM CHLORIDE 0.9% FLUSH
10.0000 mL | INTRAVENOUS | Status: DC | PRN
  Administered 2021-06-02: 10 mL via INTRAVENOUS

## 2021-06-02 MED ORDER — METHYLPHENIDATE HCL 5 MG PO TABS: 5.0000 mg | ORAL_TABLET | Freq: Every day | ORAL | 0 refills | Status: DC | PRN

## 2021-06-02 NOTE — Progress Notes (Signed)
[place], [place] [address]   CLINIC:  Medical Oncology/Hematology  PCP:  Sharilyn Sites, [place] / [place] [address] [phone]   REASON FOR VISIT:  Follow-up for MDS  PRIOR THERAPY: Aranesp, Retacrit, intermittent PRBC transfusions  NGS Results: not done  CURRENT THERAPY: Azacitidine (initiated on 09/16/2020)  BRIEF ONCOLOGIC HISTORY:  Oncology History  Myelodysplastic syndrome ([facility])  09/16/2020 -  Chemotherapy   Patient is on Treatment Plan : MYELODYSPLASIA  Azacitidine IV D1-7 q28d       CANCER STAGING:  Cancer Staging  No matching staging information was found for the patient.  INTERVAL HISTORY:  [name], a 88 y.o. male, returns for routine follow-up and consideration for next cycle of chemotherapy. [name] was last seen on 04/14/2021.  Due for cycle #8 of Azacitidine today.   Overall, he tells me he has been feeling pretty good. He reports low energy and activity levels. He reports SOB with exertion. He reports that he does not notice any difference following treatment, although he does reports noticeably improved energy following RBC transfusions. He denies recent infections. He denies n/v/d. He reports poor appetite. He is drinking 2 Ensure daily. He reports trying Megace previously, and it did not improved his appetite. He has lost 2 lbs since 01/09.   Overall, he feels ready for next cycle of chemo today.   REVIEW OF SYSTEMS:  Review of Systems  Constitutional:  Positive for appetite change and fatigue. Unexpected weight change: -2 lbs. Respiratory:  Positive for shortness of breath.   Gastrointestinal:  Negative for diarrhea and nausea.  Psychiatric/Behavioral:  Positive for sleep disturbance.   All other systems reviewed and are negative.  PAST MEDICAL/SURGICAL HISTORY:  Past Medical History:  Diagnosis Date   Bone cancer ([facility])    Chronic kidney insufficiency    COPD (chronic  obstructive pulmonary disease) ([facility])    GERD (gastroesophageal reflux disease)    Pulmonary nodule    Past Surgical History:  Procedure Laterality Date   BACK SURGERY     CATARACT EXTRACTION W/PHACO Right 03/09/2016   Procedure: CATARACT EXTRACTION PHACO AND INTRAOCULAR LENS PLACEMENT RIGHT EYE CDE=8.58;  Surgeon: Tonny Branch, MD;  Location: [facility];  Service: Ophthalmology;  Laterality: Right;  right   CATARACT EXTRACTION W/PHACO Left 04/13/2016   Procedure: CATARACT EXTRACTION PHACO AND INTRAOCULAR LENS PLACEMENT (IOC);  Surgeon: Tonny Branch, MD;  Location: [facility];  Service: Ophthalmology;  Laterality: Left;  CDE: 8.08   KYPHOPLASTY Bilateral 06/09/2019   Procedure: T11 KYPHOPLASTY;  Surgeon: Consuella Lose, MD;  Location: [facility];  Service: Neurosurgery;  Laterality: Bilateral;   None to Date  10/01/15   PORTACATH PLACEMENT Left 10/11/2020   Procedure: INSERTION PORT-A-CATH (attached catheter in left internal jugular);  Surgeon: Virl Cagey, MD;  Location: [facility];  Service: General;  Laterality: Left;    SOCIAL HISTORY:  Social History   Socioeconomic History   Marital status: Widowed    Spouse name: Not on file   Number of children: Not on file   Years of education: Not on file   Highest education level: Not on file  Occupational History   Not on file  Tobacco Use   Smoking status: Former    Years: 15.00    Types: Cigarettes, Cigars    Quit date: 10/01/1990    Years since quitting: 30.6   Smokeless tobacco: Never   Tobacco comments:    Quit x 25-30  years; 2 cigars daily when smoked  Vaping Use   Vaping Use: Never used  Substance and Sexual Activity   Alcohol use: Not Currently    Alcohol/week: 0.0 standard drinks   Drug use: No   Sexual activity: Never    Birth control/protection: None  Other Topics Concern   Not on file  Social History Narrative   Not on file   Social Determinants of Health   Financial Resource Strain: Not on file  Food Insecurity: Not on  file  Transportation Needs: Not on file  Physical Activity: Not on file  Stress: Not on file  Social Connections: Not on file  Intimate Partner Violence: Not on file    FAMILY HISTORY:  Family History  Problem Relation Age of Onset   Kidney disease Mother    Kidney disease Sister    Colon cancer Neg Hx    Gastric cancer Neg Hx    Esophageal cancer Neg Hx     CURRENT MEDICATIONS:  Current Outpatient Medications  Medication Sig Dispense Refill   cefdinir (OMNICEF) 300 MG capsule Take 1 capsule (300 mg total) by mouth daily. For UTI 7 capsule 0   megestrol (MEGACE) 400 MG/10ML suspension Take 10 mLs (400 mg total) by mouth 2 (two) times daily. 480 mL 1   nitrofurantoin, macrocrystal-monohydrate, (MACROBID) 100 MG capsule Take 1 capsule (100 mg total) by mouth every 12 (twelve) hours. 14 capsule 0   pantoprazole (PROTONIX) 40 MG tablet Take 40 mg by mouth daily. Pt no longer taking     predniSONE (DELTASONE) 10 MG tablet Take 10 mg by mouth daily with breakfast.     silodosin (RAPAFLO) 4 MG CAPS capsule Take 1 capsule (4 mg total) by mouth at bedtime. 30 capsule 10   tiotropium (SPIRIVA HANDIHALER) 18 MCG inhalation capsule Place 1 capsule (18 mcg total) into inhaler and inhale daily. 30 capsule 12   acetaminophen (TYLENOL) 325 MG tablet Take 2 tablets (650 mg total) by mouth every 6 (six) hours as needed for mild pain, fever or headache (or Fever >/= 101). (Patient not taking: Reported on 06/02/2021) 30 tablet 2   albuterol (VENTOLIN HFA) 108 (90 Base) MCG/ACT inhaler Inhale 2 puffs into the lungs every 4 (four) hours as needed for wheezing or shortness of breath. (Patient not taking: Reported on 06/02/2021) 18 g 2   ondansetron (ZOFRAN) 4 MG tablet Take 1 tablet (4 mg total) by mouth every 6 (six) hours as needed for nausea or vomiting. (Patient not taking: Reported on 06/02/2021) 12 tablet 0   prochlorperazine (COMPAZINE) 10 MG tablet Take 1 tablet (10 mg total) by mouth every 6 (six)  hours as needed for nausea or vomiting. (Patient not taking: Reported on 06/02/2021) 30 tablet 3   No current facility-administered medications for this visit.   Facility-Administered Medications Ordered in Other Visits  Medication Dose Route Frequency Provider Last Rate Last Admin   0.9 %  sodium chloride infusion   Intravenous Continuous Derek Jack, MD   Stopped at 01/06/21 1018   sodium chloride flush (NS) 0.9 % injection 10 mL  10 mL Intracatheter PRN Derek Jack, MD   10 mL at 01/06/21 1215   sodium chloride flush (NS) 0.9 % injection 10 mL  10 mL Intravenous PRN Derek Jack, MD   10 mL at 02/17/21 0902   sodium chloride flush (NS) 0.9 % injection 10 mL  10 mL Intravenous PRN Derek Jack, MD   10 mL at 03/10/21 [phone]  ALLERGIES:  No Known Allergies  PHYSICAL EXAM:  Performance status (ECOG): 2 - Symptomatic, <50% confined to bed  There were no vitals filed for this visit. Wt Readings from Last 3 Encounters:  06/02/21 121 lb 12.8 oz (55.2 kg)  05/26/21 123 lb (55.8 kg)  04/24/21 132 lb 12.8 oz (60.2 kg)   Physical Exam Vitals reviewed.  Constitutional:      Appearance: Normal appearance.     Interventions: Nasal cannula in place.  Cardiovascular:     Rate and Rhythm: Normal rate and regular rhythm.     Pulses: Normal pulses.     Heart sounds: Normal heart sounds.  Pulmonary:     Effort: Pulmonary effort is normal.     Breath sounds: Normal breath sounds.  Musculoskeletal:     Right lower leg: No edema.     Left lower leg: No edema.  Neurological:     General: No focal deficit present.     Mental Status: He is alert and oriented to person, place, and time.  Psychiatric:        Mood and Affect: Mood normal.        Behavior: Behavior normal.    LABORATORY DATA:  I have reviewed the labs as listed.  CBC Latest Ref Rng & Units 06/02/2021 05/26/2021 05/20/2021  WBC 4.0 - 10.5 K/uL 2.8(L) 2.4(L) 2.6(L)  Hemoglobin 13.0 - 17.0 g/dL 8.1(L)  9.5(L) 7.5(L)  Hematocrit 39.0 - 52.0 % 25.7(L) 30.8(L) 24.2(L)  Platelets 150 - 400 K/uL 41(L) 37(L) 35(L)   CMP Latest Ref Rng & Units 06/02/2021 05/05/2021 04/21/2021  Glucose 70 - 99 mg/dL 94 97 92  BUN 8 - 23 mg/dL 42(H) 34(H) 39(H)  Creatinine 0.61 - 1.24 mg/dL 2.11(H) 2.01(H) 2.03(H)  Sodium 135 - 145 mmol/L 134(L) 135 136  Potassium 3.5 - 5.1 mmol/L 3.5 3.5 3.5  Chloride 98 - 111 mmol/L 108 106 107  CO2 22 - 32 mmol/L 21(L) 22 21(L)  Calcium 8.9 - 10.3 mg/dL 9.4 9.0 9.6  Total Protein 6.5 - 8.1 g/dL 6.6 6.6 6.4(L)  Total Bilirubin 0.3 - 1.2 mg/dL 0.7 0.8 0.4  Alkaline Phos 38 - 126 U/L 67 61 68  AST 15 - 41 U/L _0 ALT 0 - 44 U/L _1 DIAGNOSTIC IMAGING:  I have independently reviewed the scans and discussed with the patient. CT BIOPSY  Result Date: 05/26/2021 INDICATION: MDS EXAM: CT BONE MARROW BIOPSY AND ASPIRATION; CT BIOPSY MEDICATIONS: None. ANESTHESIA/SEDATION: Moderate (conscious) sedation was employed during this procedure. A total of Versed 0.5 mg and Fentanyl 25 mcg was administered intravenously. Moderate Sedation Time: 10 minutes. The patient's level of consciousness and vital signs were monitored continuously by radiology nursing throughout the procedure under my direct supervision. FLUOROSCOPY TIME:  N/a COMPLICATIONS: None immediate. PROCEDURE: Informed written consent was obtained from the patient after a thorough discussion of the procedural risks, benefits and alternatives. All questions were addressed. Maximal Sterile Barrier Technique was utilized including caps, mask, sterile gowns, sterile gloves, sterile drape, hand hygiene and skin antiseptic. A timeout was performed prior to the initiation of the procedure. The patient was placed prone on the CT exam table. Limited CT of the pelvis was performed for planning purposes. Skin entry site was marked, and the overlying skin was prepped and draped in the standard sterile fashion. Local analgesia was  obtained with 1% lidocaine. Using CT guidance, an 11 gauge needle was advanced just deep to the cortex of  the right posterior ilium. Subsequently, bone marrow aspiration and core biopsy were performed. Specimens were submitted to lab/pathology for handling. Hemostasis was achieved with manual pressure, and a clean dressing was placed. The patient tolerated the procedure well without immediate complication. IMPRESSION: Successful CT-guided bone marrow aspiration and core biopsy of the right posterior ilium. Electronically Signed   By: Albin Felling M.D.   On: 05/26/2021 14:04   CT BONE MARROW BIOPSY & ASPIRATION  Result Date: 05/26/2021 INDICATION: MDS EXAM: CT BONE MARROW BIOPSY AND ASPIRATION; CT BIOPSY MEDICATIONS: None. ANESTHESIA/SEDATION: Moderate (conscious) sedation was employed during this procedure. A total of Versed 0.5 mg and Fentanyl 25 mcg was administered intravenously. Moderate Sedation Time: 10 minutes. The patient's level of consciousness and vital signs were monitored continuously by radiology nursing throughout the procedure under my direct supervision. FLUOROSCOPY TIME:  N/a COMPLICATIONS: None immediate. PROCEDURE: Informed written consent was obtained from the patient after a thorough discussion of the procedural risks, benefits and alternatives. All questions were addressed. Maximal Sterile Barrier Technique was utilized including caps, mask, sterile gowns, sterile gloves, sterile drape, hand hygiene and skin antiseptic. A timeout was performed prior to the initiation of the procedure. The patient was placed prone on the CT exam table. Limited CT of the pelvis was performed for planning purposes. Skin entry site was marked, and the overlying skin was prepped and draped in the standard sterile fashion. Local analgesia was obtained with 1% lidocaine. Using CT guidance, an 11 gauge needle was advanced just deep to the cortex of the right posterior ilium. Subsequently, bone marrow aspiration  and core biopsy were performed. Specimens were submitted to lab/pathology for handling. Hemostasis was achieved with manual pressure, and a clean dressing was placed. The patient tolerated the procedure well without immediate complication. IMPRESSION: Successful CT-guided bone marrow aspiration and core biopsy of the right posterior ilium. Electronically Signed   By: Albin Felling M.D.   On: 05/26/2021 14:04     ASSESSMENT:  1.  Myelodysplastic syndrome with pancytopenia: - Bone marrow biopsy on 12/08/2016 in [place], [place] showed normocellular marrow with trilineage hematopoiesis with erythroid predominance and dysmegakaryopoiesis.  Blasts were not increased.  Cytogenetics showed 46, XY,del(12)(p.2p13)[8]/46,XY[12].  FISH was positive for 12p-. - Bone marrow biopsy and testing in 2018 was consistent with myelodysplastic syndrome with single lineage dysplasia (MDS-SLD),IPSS-R score of 2, consistent with low risk category -  Repeat bone marrow biopsy performed on 08/13/2019 by Dr. Delton Coombes with the following results: Hypercellular bone marrow with dyspoietic changes Bone marrow aspirate with 5% blastic cells of granulocytic precursors Flow pathology with 3% blasts Peripheral blood with 2% circulating blasts Cytogenetic analysis show del(12p) and 13 of the examined cells with remaining 7 cells showing normal chromosome Karyotype: 46,XY,del(12)(p13p11.2)[13]/46,XY[7] FISH analysis was normal - Based on most recent bone marrow biopsy (08/04/2020), patient's IPSS-R is 5, showing that he has progressed to the high risk category - He has required multiple blood transfusions over the past 6 months, most recently on 08/09/2020 (Hgb 7.1) - No improvement on monthly Aranesp or on weekly Retacrit injections (started on 07/10/2020 - discontinued 09/04/2020) - Stool occult blood cards x3 were negative -Azacitidine (50 mg/m2) x5 days started on 09/16/2020.   PLAN:  1.  Myelodysplastic syndrome (high risk)  with pancytopenia: - He has completed 7 cycles of azacitidine 50 mg per metered square for 5 days, cycle 7 on 04/21/2021. - We reviewed bone marrow biopsy results from 05/26/2021.  Hypercellular marrow with aspirate showing 6% blastic cells.  Previous bone marrow showed 5% blastic cells. - MDS FISH panel was normal. - Cytogenetics showed 46, XY, del (12) in 10 chromosomes and 10 normal chromosomes which is similar to previous marrow. - He has required 3 units of blood transfusion since last treatment on 04/21/2021. - His only complaint is fatigue. - We discussed about increasing the dose of azacitidine for the next cycle which she is agreeable. - RTC 6 weeks for follow-up.  Continue checking CBC every week.  2.  CKD: - Baseline creatinine one-point-2.0.  Creatinine today is 2.1.  3.  Bladder problems: - Continue Rapaflo which is helping his symptoms.  4.  Weight loss: - He lost about 2 pounds from last visit.  He is drinking 2 cans of Ensure per day. - He has tried Marinol and Megace in the past which did not help.  5.  MDS/treatment related fatigue: - We will start him on Ritalin 5 mg in the mornings as needed.   Orders placed this encounter:  No orders of the defined types were placed in this encounter.    Derek Jack, MD [facility] [phone]   I, Thana Ates, am acting as a scribe for Dr. Derek Jack.  I, Derek Jack MD, have reviewed the above documentation for accuracy and completeness, and I agree with the above.

## 2021-06-02 NOTE — Patient Instructions (Addendum)
[place] at [facility] Discharge Instructions   You were seen and examined today by Dr. Delton Coombes.  He reviewed your lab work and the results of your bone marrow biopsy. The blasts on your bone marrow biopsy have gone up slightly.  We will increase the dose of your Vidaza to see if we can get this under control. The current dose has helped keep your MDS stable.  We will keep you on the same schedule of Days 1-5 every 6 weeks.   Return as scheduled for lab checks, office visit, and treatment.    Thank you for choosing [place] at [facility] to provide your oncology and hematology care.  To afford each patient quality time with our provider, please arrive at least 15 minutes before your scheduled appointment time.   If you have a lab appointment with the [facility] please come in thru the Main Entrance and check in at the main information desk.  You need to re-schedule your appointment should you arrive 10 or more minutes late.  We strive to give you quality time with our providers, and arriving late affects you and other patients whose appointments are after yours.  Also, if you no show three or more times for appointments you may be dismissed from the clinic at the providers discretion.     Again, thank you for choosing [facility].  Our hope is that these requests will decrease the amount of time that you wait before being seen by our physicians.       _____________________________________________________________  Should you have questions after your visit to [facility], please contact our office at [phone] and follow the prompts.  Our office hours are 8:00 a.m. and 4:30 p.m. Monday - Friday.  Please note that voicemails left after 4:00 p.m. may not be returned until the following business day.  We are closed weekends and major holidays.  You do have access to a nurse 24-7, just call the main number to the  clinic [phone] and do not press any options, hold on the line and a nurse will answer the phone.    For prescription refill requests, have your pharmacy contact our office and allow 72 hours.    Due to Covid, you will need to wear a mask upon entering the hospital. If you do not have a mask, a mask will be given to you at the Main Entrance upon arrival. For doctor visits, patients may have 1 support person age 76 or older with them. For treatment visits, patients can not have anyone with them due to social distancing guidelines and our immunocompromised population.

## 2021-06-02 NOTE — Progress Notes (Signed)
No treatment today per Dr.K  We will start him next week. Dr. Raliegh Ip is planning on increasing his Vidaza dose, but he wants his platelets to be between 50-60K before starting at the increased dose.

## 2021-06-02 NOTE — Progress Notes (Signed)
Nutrition Follow-up:  Patient with myelodysplastic syndrome. He is receiving Azacitidine q5 weeks.   Patient did not receive treatment today. Briefly able to speak with patient as he was walking to registration for check-out. Patient with no complaints, reports doing "suprisingly well for a man his age."   Medications: rapaflo  Labs: Na 134, BUN 42, Cr 2.11  Anthropometrics: Weight 121 lb 12.8 oz today slightly decreased from 123 lb on 1/09  12/5 - 123 lb 8 oz 11/28 - 124 lb 6.4 oz  10/31 - 123 lb 12.8 oz    NUTRITION DIAGNOSIS: Unintentional weight loss stable   INTERVENTION:  Continue drinking Ensure Complete twice daily    MONITORING, EVALUATION, GOAL: weight trends, intake    NEXT VISIT: Monday January 23 during infusion

## 2021-06-03 ENCOUNTER — Ambulatory Visit (HOSPITAL_COMMUNITY): Payer: Medicare Other

## 2021-06-04 ENCOUNTER — Ambulatory Visit (HOSPITAL_COMMUNITY): Payer: Medicare Other

## 2021-06-05 ENCOUNTER — Ambulatory Visit (HOSPITAL_COMMUNITY): Payer: Medicare Other

## 2021-06-06 ENCOUNTER — Ambulatory Visit (HOSPITAL_COMMUNITY): Payer: Medicare Other

## 2021-06-09 ENCOUNTER — Other Ambulatory Visit: Payer: Self-pay

## 2021-06-09 ENCOUNTER — Inpatient Hospital Stay (HOSPITAL_COMMUNITY): Payer: Medicare Other

## 2021-06-09 ENCOUNTER — Inpatient Hospital Stay (HOSPITAL_COMMUNITY): Payer: Medicare Other | Admitting: Dietician

## 2021-06-09 DIAGNOSIS — D649 Anemia, unspecified: Secondary | ICD-10-CM

## 2021-06-09 DIAGNOSIS — R5383 Other fatigue: Secondary | ICD-10-CM | POA: Diagnosis not present

## 2021-06-09 DIAGNOSIS — R0602 Shortness of breath: Secondary | ICD-10-CM | POA: Diagnosis not present

## 2021-06-09 DIAGNOSIS — G479 Sleep disorder, unspecified: Secondary | ICD-10-CM | POA: Diagnosis not present

## 2021-06-09 DIAGNOSIS — D469 Myelodysplastic syndrome, unspecified: Secondary | ICD-10-CM

## 2021-06-09 DIAGNOSIS — N1832 Chronic kidney disease, stage 3b: Secondary | ICD-10-CM | POA: Diagnosis not present

## 2021-06-09 DIAGNOSIS — D61818 Other pancytopenia: Secondary | ICD-10-CM | POA: Diagnosis not present

## 2021-06-09 LAB — SAMPLE TO BLOOD BANK

## 2021-06-09 LAB — COMPREHENSIVE METABOLIC PANEL
ALT: 12 U/L (ref 0–44)
AST: 21 U/L (ref 15–41)
Albumin: 3.3 g/dL — ABNORMAL LOW (ref 3.5–5.0)
Alkaline Phosphatase: 70 U/L (ref 38–126)
Anion gap: 10 (ref 5–15)
BUN: 33 mg/dL — ABNORMAL HIGH (ref 8–23)
CO2: 20 mmol/L — ABNORMAL LOW (ref 22–32)
Calcium: 9.5 mg/dL (ref 8.9–10.3)
Chloride: 103 mmol/L (ref 98–111)
Creatinine, Ser: 2.22 mg/dL — ABNORMAL HIGH (ref 0.61–1.24)
GFR, Estimated: 28 mL/min — ABNORMAL LOW (ref >60)
Glucose, Bld: 113 mg/dL — ABNORMAL HIGH (ref 70–99)
Potassium: 3.3 mmol/L — ABNORMAL LOW (ref 3.5–5.1)
Sodium: 133 mmol/L — ABNORMAL LOW (ref 135–145)
Total Bilirubin: 0.9 mg/dL (ref 0.3–1.2)
Total Protein: 6.8 g/dL (ref 6.5–8.1)

## 2021-06-09 LAB — CBC WITH DIFFERENTIAL/PLATELET
Abs Immature Granulocytes: 0.06 10*3/uL (ref 0.00–0.07)
Basophils Absolute: 0 10*3/uL (ref 0.0–0.1)
Basophils Relative: 0 %
Eosinophils Absolute: 0 10*3/uL (ref 0.0–0.5)
Eosinophils Relative: 0 %
HCT: 25.3 % — ABNORMAL LOW (ref 39.0–52.0)
Hemoglobin: 8 g/dL — ABNORMAL LOW (ref 13.0–17.0)
Immature Granulocytes: 2 %
Lymphocytes Relative: 17 %
Lymphs Abs: 0.6 10*3/uL — ABNORMAL LOW (ref 0.7–4.0)
MCH: 26 pg (ref 26.0–34.0)
MCHC: 31.6 g/dL (ref 30.0–36.0)
MCV: 82.1 fL (ref 80.0–100.0)
Monocytes Absolute: 0.2 10*3/uL (ref 0.1–1.0)
Monocytes Relative: 6 %
Neutro Abs: 2.7 10*3/uL (ref 1.7–7.7)
Neutrophils Relative %: 75 %
Platelets: 40 10*3/uL — ABNORMAL LOW (ref 150–400)
RBC: 3.08 MIL/uL — ABNORMAL LOW (ref 4.22–5.81)
RDW: 19.7 % — ABNORMAL HIGH (ref 11.5–15.5)
WBC: 3.6 10*3/uL — ABNORMAL LOW (ref 4.0–10.5)
nRBC: 0.6 % — ABNORMAL HIGH (ref 0.0–0.2)

## 2021-06-09 LAB — LACTATE DEHYDROGENASE: LDH: 143 U/L (ref 98–192)

## 2021-06-09 LAB — MAGNESIUM: Magnesium: 2.1 mg/dL (ref 1.7–2.4)

## 2021-06-09 MED ORDER — SODIUM CHLORIDE 0.9% FLUSH
10.0000 mL | INTRAVENOUS | Status: DC | PRN
  Administered 2021-06-09: 10 mL via INTRAVENOUS

## 2021-06-09 MED ORDER — HEPARIN SOD (PORK) LOCK FLUSH 100 UNIT/ML IV SOLN
500.0000 [IU] | Freq: Once | INTRAVENOUS | Status: AC
  Administered 2021-06-09: 500 [IU] via INTRAVENOUS

## 2021-06-09 NOTE — Progress Notes (Signed)
Nutrition Follow-up:  Patient with myelodysplastic syndrome. He is receiving Azacitidine q6 weeks.   Met with patient in infusion. He reports appetite is poor, early satiety, and fatigue. Patient recalls joining a friend for dinner last night. They prepared his favorite (pot roast). Patient ate ~3 oz and was "unable to eat another bite after that." He is eating 3 small meals/day. For breakfast he has 2 frozen mini sausage biscuits and a cup of coffee, eats out for lunch which is typically a 1/2 sandwich (bologna, ham, chicken or tuna salad). Patient eats other half for dinner, heats up frozen meal, or can of soup. Patient reports drinking 2 Ensure Complete daily. He makes a milkshake using Ensure in the evenings. Patient started taking Ritalin 1/16. He is taking this every morning, reports he has not noticed improvement in energy levels. He denies nausea, vomiting, diarrhea, constipation.   Medications: Ritalin  Labs: Na 133, K 3.3, Glucose 113, BUN 33, Cr 2.22  Anthropometrics: Weight 117 lb 3.2 oz today has decreased 3.7% (~4.5 lbs) in one week; significant  1/16 - 121 lb 12.8 oz 1/09 - 123 lb  12/8 - 132 lb 12.8 oz (?) 12/5 - 123 lb 8 oz  11/28 - 124 lb 6.4 oz    NUTRITION DIAGNOSIS: Unintentional weight loss ongoing   MALNUTRITION DIAGNOSIS: Severe malnutrition related to chronic illness (myelodysplastic syndrome, COPD) as evidenced by severe fat and muscle depletions on exam, intake <75% of estimated needs for > one month per dietary recall. Patient is underweight with significant 3.7% weight decrease in one week.    INTERVENTION:  Continue to encourage small frequent meals and snacks  Reviewed ways to add calories and protein to foods (using milk vs water when making creamy soups or oatmeal, adding cheese and mayo to sandwiches, choosing high calorie frozen entrees) Continue drinking Ensure Complete, recommend 3x/daily (350 kcal, 30 grams protein each) Continue evening high calorie,  high protein shake Patient reports no benefit from appetite stimulants s/p trials of marinol and megace Discussed significant wt decrease s/p start of ritalin with MD. Pt advised to discontinue medication  Patient has contact information     MONITORING, EVALUATION, GOAL: weight trends, intake   NEXT VISIT: Monday January 30

## 2021-06-09 NOTE — Patient Instructions (Signed)
[facility]  Discharge Instructions: Thank you for choosing [facility] to provide your oncology and hematology care.  If you have a lab appointment with the Cancer Center, please come in thru the Main Entrance and check in at the main information desk.  Wear comfortable clothing and clothing appropriate for easy access to any Portacath or PICC line.   We strive to give you quality time with your provider. You may need to reschedule your appointment if you arrive late (15 or more minutes).  Arriving late affects you and other patients whose appointments are after yours.  Also, if you miss three or more appointments without notifying the office, you may be dismissed from the clinic at the provider's discretion.      For prescription refill requests, have your pharmacy contact our office and allow 72 hours for refills to be completed.        To help prevent nausea and vomiting after your treatment, we encourage you to take your nausea medication as directed.  BELOW ARE SYMPTOMS THAT SHOULD BE REPORTED IMMEDIATELY: *FEVER GREATER THAN 100.4 F (38 C) OR HIGHER *CHILLS OR SWEATING *NAUSEA AND VOMITING THAT IS NOT CONTROLLED WITH YOUR NAUSEA MEDICATION *UNUSUAL SHORTNESS OF BREATH *UNUSUAL BRUISING OR BLEEDING *URINARY PROBLEMS (pain or burning when urinating, or frequent urination) *BOWEL PROBLEMS (unusual diarrhea, constipation, pain near the anus) TENDERNESS IN MOUTH AND THROAT WITH OR WITHOUT PRESENCE OF ULCERS (sore throat, sores in mouth, or a toothache) UNUSUAL RASH, SWELLING OR PAIN  UNUSUAL VAGINAL DISCHARGE OR ITCHING   Items with * indicate a potential emergency and should be followed up as soon as possible or go to the Emergency Department if any problems should occur.  Please show the CHEMOTHERAPY ALERT CARD or IMMUNOTHERAPY ALERT CARD at check-in to the Emergency Department and triage nurse.  Should you have questions after your visit or need to cancel  or reschedule your appointment, please contact  CANCER CENTER [phone]  and follow the prompts.  Office hours are 8:00 a.m. to 4:30 p.m. Monday - Friday. Please note that voicemails left after 4:00 p.m. may not be returned until the following business day.  We are closed weekends and major holidays. You have access to a nurse at all times for urgent questions. Please call the main number to the clinic [phone] and follow the prompts.  For any non-urgent questions, you may also contact your provider using MyChart. We now offer e-Visits for anyone 18 and older to request care online for non-urgent symptoms. For details visit [email or web address].   Also download the MyChart app! Go to the app store, search "MyChart", open the app, select [place], and log in with your MyChart username and password.  Due to Covid, a mask is required upon entering the hospital/clinic. If you do not have a mask, one will be given to you upon arrival. For doctor visits, patients may have 1 support person aged 18 or older with them. For treatment visits, patients cannot have anyone with them due to current Covid guidelines and our immunocompromised population.  

## 2021-06-09 NOTE — Progress Notes (Signed)
Patient presents today for chemotherapy treatment.  Patient is in satisfactory condition with no new complaints voiced.  Vital signs are stable.    Platelets today are 40.  Hemoglobin is 8.0.  We will hold Vidaza treatment per Dr. Delton Coombes.  No blood transfusion today as patient is asymptomatic.  Patient left via motorized wheelchair in stable condition.  Vital signs stable at discharge.  Follow up as scheduled.

## 2021-06-10 ENCOUNTER — Ambulatory Visit (HOSPITAL_COMMUNITY): Payer: Medicare Other

## 2021-06-11 ENCOUNTER — Ambulatory Visit (HOSPITAL_COMMUNITY): Payer: Medicare Other

## 2021-06-12 ENCOUNTER — Ambulatory Visit (HOSPITAL_COMMUNITY): Payer: Medicare Other

## 2021-06-13 ENCOUNTER — Ambulatory Visit (HOSPITAL_COMMUNITY): Payer: Medicare Other

## 2021-06-16 ENCOUNTER — Other Ambulatory Visit: Payer: Self-pay

## 2021-06-16 ENCOUNTER — Observation Stay (HOSPITAL_COMMUNITY)
Admission: EM | Admit: 2021-06-16 | Discharge: 2021-06-17 | Disposition: A | Discharge status: Home / Self Care | Payer: Medicare Other | Attending: Internal Medicine | Admitting: Internal Medicine

## 2021-06-16 ENCOUNTER — Emergency Department (HOSPITAL_COMMUNITY): Payer: Medicare Other

## 2021-06-16 ENCOUNTER — Other Ambulatory Visit (HOSPITAL_COMMUNITY): Payer: Medicare Other

## 2021-06-16 ENCOUNTER — Encounter (HOSPITAL_COMMUNITY): Payer: Self-pay | Admitting: Hematology

## 2021-06-16 ENCOUNTER — Ambulatory Visit (HOSPITAL_COMMUNITY): Payer: Medicare Other

## 2021-06-16 ENCOUNTER — Encounter (HOSPITAL_COMMUNITY): Payer: Self-pay

## 2021-06-16 ENCOUNTER — Encounter (HOSPITAL_COMMUNITY): Payer: Medicare Other | Admitting: Dietician

## 2021-06-16 ENCOUNTER — Encounter (HOSPITAL_COMMUNITY): Payer: Medicare Other

## 2021-06-16 DIAGNOSIS — R0602 Shortness of breath: Secondary | ICD-10-CM | POA: Insufficient documentation

## 2021-06-16 DIAGNOSIS — D469 Myelodysplastic syndrome, unspecified: Secondary | ICD-10-CM | POA: Insufficient documentation

## 2021-06-16 DIAGNOSIS — N1832 Chronic kidney disease, stage 3b: Secondary | ICD-10-CM | POA: Insufficient documentation

## 2021-06-16 DIAGNOSIS — R531 Weakness: Secondary | ICD-10-CM

## 2021-06-16 DIAGNOSIS — D62 Acute posthemorrhagic anemia: Secondary | ICD-10-CM | POA: Diagnosis present

## 2021-06-16 DIAGNOSIS — R55 Syncope and collapse: Secondary | ICD-10-CM | POA: Insufficient documentation

## 2021-06-16 DIAGNOSIS — I959 Hypotension, unspecified: Secondary | ICD-10-CM | POA: Insufficient documentation

## 2021-06-16 DIAGNOSIS — J9611 Chronic respiratory failure with hypoxia: Secondary | ICD-10-CM | POA: Insufficient documentation

## 2021-06-16 DIAGNOSIS — R0902 Hypoxemia: Secondary | ICD-10-CM | POA: Diagnosis not present

## 2021-06-16 DIAGNOSIS — N183 Chronic kidney disease, stage 3 unspecified: Secondary | ICD-10-CM | POA: Diagnosis present

## 2021-06-16 DIAGNOSIS — D696 Thrombocytopenia, unspecified: Secondary | ICD-10-CM | POA: Insufficient documentation

## 2021-06-16 DIAGNOSIS — R5383 Other fatigue: Secondary | ICD-10-CM | POA: Diagnosis not present

## 2021-06-16 DIAGNOSIS — R918 Other nonspecific abnormal finding of lung field: Secondary | ICD-10-CM | POA: Diagnosis not present

## 2021-06-16 DIAGNOSIS — J449 Chronic obstructive pulmonary disease, unspecified: Secondary | ICD-10-CM | POA: Insufficient documentation

## 2021-06-16 DIAGNOSIS — C419 Malignant neoplasm of bone and articular cartilage, unspecified: Secondary | ICD-10-CM | POA: Diagnosis not present

## 2021-06-16 DIAGNOSIS — R29898 Other symptoms and signs involving the musculoskeletal system: Secondary | ICD-10-CM | POA: Diagnosis not present

## 2021-06-16 DIAGNOSIS — Z20822 Contact with and (suspected) exposure to covid-19: Secondary | ICD-10-CM | POA: Insufficient documentation

## 2021-06-16 DIAGNOSIS — D649 Anemia, unspecified: Secondary | ICD-10-CM | POA: Diagnosis not present

## 2021-06-16 DIAGNOSIS — I9589 Other hypotension: Secondary | ICD-10-CM

## 2021-06-16 DIAGNOSIS — G479 Sleep disorder, unspecified: Secondary | ICD-10-CM | POA: Diagnosis not present

## 2021-06-16 DIAGNOSIS — D61818 Other pancytopenia: Secondary | ICD-10-CM | POA: Diagnosis not present

## 2021-06-16 LAB — COMPREHENSIVE METABOLIC PANEL
ALT: 11 U/L (ref 0–44)
AST: 17 U/L (ref 15–41)
Albumin: 2.9 g/dL — ABNORMAL LOW (ref 3.5–5.0)
Alkaline Phosphatase: 59 U/L (ref 38–126)
Anion gap: 3 — ABNORMAL LOW (ref 5–15)
BUN: 38 mg/dL — ABNORMAL HIGH (ref 8–23)
CO2: 21 mmol/L — ABNORMAL LOW (ref 22–32)
Calcium: 9.1 mg/dL (ref 8.9–10.3)
Chloride: 109 mmol/L (ref 98–111)
Creatinine, Ser: 1.96 mg/dL — ABNORMAL HIGH (ref 0.61–1.24)
GFR, Estimated: 32 mL/min — ABNORMAL LOW (ref >60)
Glucose, Bld: 90 mg/dL (ref 70–99)
Potassium: 3.4 mmol/L — ABNORMAL LOW (ref 3.5–5.1)
Sodium: 133 mmol/L — ABNORMAL LOW (ref 135–145)
Total Bilirubin: 0.8 mg/dL (ref 0.3–1.2)
Total Protein: 6.1 g/dL — ABNORMAL LOW (ref 6.5–8.1)

## 2021-06-16 LAB — TROPONIN I (HIGH SENSITIVITY)
Troponin I (High Sensitivity): 19 ng/L — ABNORMAL HIGH (ref <18)
Troponin I (High Sensitivity): 19 ng/L — ABNORMAL HIGH (ref <18)

## 2021-06-16 LAB — BRAIN NATRIURETIC PEPTIDE: B Natriuretic Peptide: 52 pg/mL (ref 0.0–100.0)

## 2021-06-16 LAB — RESP PANEL BY RT-PCR (FLU A&B, COVID) ARPGX2
Influenza A by PCR: NEGATIVE
Influenza B by PCR: NEGATIVE
SARS Coronavirus 2 by RT PCR: NEGATIVE

## 2021-06-16 LAB — URINALYSIS, ROUTINE W REFLEX MICROSCOPIC
Bilirubin Urine: NEGATIVE
Glucose, UA: NEGATIVE mg/dL
Ketones, ur: NEGATIVE mg/dL
Leukocytes,Ua: NEGATIVE
Nitrite: NEGATIVE
Specific Gravity, Urine: 1.025 (ref 1.005–1.030)
pH: 6 (ref 5.0–8.0)

## 2021-06-16 LAB — URINALYSIS, MICROSCOPIC (REFLEX)

## 2021-06-16 LAB — CBC WITH DIFFERENTIAL/PLATELET
Abs Immature Granulocytes: 0.15 10*3/uL — ABNORMAL HIGH (ref 0.00–0.07)
Basophils Absolute: 0 10*3/uL (ref 0.0–0.1)
Basophils Relative: 0 %
Eosinophils Absolute: 0 10*3/uL (ref 0.0–0.5)
Eosinophils Relative: 0 %
HCT: 22.9 % — ABNORMAL LOW (ref 39.0–52.0)
Hemoglobin: 7 g/dL — ABNORMAL LOW (ref 13.0–17.0)
Immature Granulocytes: 3 %
Lymphocytes Relative: 8 %
Lymphs Abs: 0.4 10*3/uL — ABNORMAL LOW (ref 0.7–4.0)
MCH: 25.2 pg — ABNORMAL LOW (ref 26.0–34.0)
MCHC: 30.6 g/dL (ref 30.0–36.0)
MCV: 82.4 fL (ref 80.0–100.0)
Monocytes Absolute: 0.2 10*3/uL (ref 0.1–1.0)
Monocytes Relative: 5 %
Neutro Abs: 4.2 10*3/uL (ref 1.7–7.7)
Neutrophils Relative %: 84 %
Platelets: 31 10*3/uL — ABNORMAL LOW (ref 150–400)
RBC: 2.78 MIL/uL — ABNORMAL LOW (ref 4.22–5.81)
RDW: 19.2 % — ABNORMAL HIGH (ref 11.5–15.5)
WBC: 5 10*3/uL (ref 4.0–10.5)
nRBC: 0.4 % — ABNORMAL HIGH (ref 0.0–0.2)

## 2021-06-16 LAB — MAGNESIUM: Magnesium: 2 mg/dL (ref 1.7–2.4)

## 2021-06-16 LAB — PROTIME-INR
INR: 1.1 (ref 0.8–1.2)
Prothrombin Time: 14.3 seconds (ref 11.4–15.2)

## 2021-06-16 LAB — LACTIC ACID, PLASMA
Lactic Acid, Venous: 2.1 mmol/L (ref 0.5–1.9)
Lactic Acid, Venous: 1.1 mmol/L (ref 0.5–1.9)

## 2021-06-16 LAB — PREPARE RBC (CROSSMATCH)

## 2021-06-16 MED ORDER — POLYETHYLENE GLYCOL 3350 17 G PO PACK: 17.0000 g | PACK | Freq: Every day | ORAL | Status: DC | PRN

## 2021-06-16 MED ORDER — LACTATED RINGERS IV BOLUS (SEPSIS)
500.0000 mL | Freq: Once | INTRAVENOUS | Status: AC
  Administered 2021-06-16: 500 mL via INTRAVENOUS

## 2021-06-16 MED ORDER — ALBUTEROL SULFATE HFA 108 (90 BASE) MCG/ACT IN AERS: 2.0000 | INHALATION_SPRAY | RESPIRATORY_TRACT | Status: DC | PRN

## 2021-06-16 MED ORDER — ONDANSETRON HCL 4 MG/2ML IJ SOLN: 4.0000 mg | Freq: Four times a day (QID) | INTRAMUSCULAR | Status: DC | PRN

## 2021-06-16 MED ORDER — NITROGLYCERIN IN D5W 200-5 MCG/ML-% IV SOLN
INTRAVENOUS | Status: AC
  Filled 2021-06-16: qty 250

## 2021-06-16 MED ORDER — ONDANSETRON HCL 4 MG PO TABS: 4.0000 mg | ORAL_TABLET | Freq: Four times a day (QID) | ORAL | Status: DC | PRN

## 2021-06-16 MED ORDER — ACETAMINOPHEN 325 MG PO TABS: 650.0000 mg | ORAL_TABLET | Freq: Four times a day (QID) | ORAL | Status: DC | PRN

## 2021-06-16 MED ORDER — LACTATED RINGERS IV SOLN: INTRAVENOUS | Status: DC

## 2021-06-16 MED ORDER — SODIUM CHLORIDE 0.9% FLUSH
3.0000 mL | Freq: Two times a day (BID) | INTRAVENOUS | Status: DC
  Administered 2021-06-16 – 2021-06-17 (×2): 3 mL via INTRAVENOUS

## 2021-06-16 MED ORDER — MORPHINE SULFATE (PF) 2 MG/ML IV SOLN: 2.0000 mg | INTRAVENOUS | Status: DC | PRN

## 2021-06-16 MED ORDER — BISACODYL 5 MG PO TBEC: 5.0000 mg | DELAYED_RELEASE_TABLET | Freq: Every day | ORAL | Status: DC | PRN

## 2021-06-16 MED ORDER — DOCUSATE SODIUM 100 MG PO CAPS
100.0000 mg | ORAL_CAPSULE | Freq: Two times a day (BID) | ORAL | Status: DC
  Administered 2021-06-16 – 2021-06-17 (×2): 100 mg via ORAL
  Filled 2021-06-16 (×2): qty 1

## 2021-06-16 MED ORDER — OXYCODONE HCL 5 MG PO TABS: 5.0000 mg | ORAL_TABLET | ORAL | Status: DC | PRN

## 2021-06-16 MED ORDER — CHLORHEXIDINE GLUCONATE CLOTH 2 % EX PADS
6.0000 | MEDICATED_PAD | Freq: Every day | CUTANEOUS | Status: DC
  Administered 2021-06-17: 6 via TOPICAL

## 2021-06-16 MED ORDER — ACETAMINOPHEN 650 MG RE SUPP: 650.0000 mg | Freq: Four times a day (QID) | RECTAL | Status: DC | PRN

## 2021-06-16 MED ORDER — HYDRALAZINE HCL 20 MG/ML IJ SOLN: 5.0000 mg | INTRAMUSCULAR | Status: DC | PRN

## 2021-06-16 MED ORDER — SODIUM CHLORIDE 0.9 % IV SOLN: 10.0000 mL/h | Freq: Once | INTRAVENOUS | Status: DC

## 2021-06-16 NOTE — Progress Notes (Signed)
Attempted to instruct patient on incentive.  Patient was sleepy and unable to give a good effort.  Left at bedside.  RT will continue to monitor.

## 2021-06-16 NOTE — ED Notes (Signed)
EMS gave patient 770mL fluid en route

## 2021-06-16 NOTE — ED Notes (Signed)
Myriam Forehand called to get an update on pt and requests Dr to give her a update when available because she lives in [place].

## 2021-06-16 NOTE — ED Notes (Signed)
Unsuccessful lab draw at this time. Lactic Acid only obtained

## 2021-06-16 NOTE — Assessment & Plan Note (Addendum)
-  Patient presenting with near syncope/syncope in the setting of known recurrent symptomatic anemia -In review of labs, his Hgb has been downtrending over the last month -Hgb 7- s/p transfusion and now 8 -PT/OT eval as patient lives alone

## 2021-06-16 NOTE — ED Provider Notes (Signed)
[facility] EMERGENCY DEPARTMENT Provider Note   CSN: [id number] Arrival date & time: 06/16/21  9629     History  Chief Complaint  Patient presents with   Hypotension   Weakness    [name] is a 88 y.o. male.  HPI Patient presents via EMS for hypotension.  Medical history is notable for CKD, COPD, GERD, MDS, BPH.  He is followed by oncology for his MDS.  Current therapy is azacitidine.  He does not take any antihypertensive medications.  At baseline, patient is on 3 L of oxygen.  He is chronically short of breath and shortness of breath is worsened with exertion.  He was feeling fatigue and had symptoms of lightheadedness and dizziness with standing yesterday.  This kept him from going to church.  He felt better in the evening.  This morning, he was getting up to go get his cancer center infusion.  When he stood up, he felt severely lightheaded and near syncopal.  He sat down and tried again with similar symptoms.  This prompted a call to EMS.  When EMS arrived on scene, patient was hypotensive in the range of 60s SBP.  He received 700 cc of IVF prior to arrival.  He states that he is currently feels improved.  He denies any current pain.  His breathing feels slightly worse than baseline.  He has not had any recent fevers or chills but states that he chronically feels cold.  He has had ongoing poor appetite which limits his food intake.  He does try to drink plenty of fluids.  He has not had any recent vomiting or diarrhea.    Home Medications Prior to Admission medications   Medication Sig Start Date End Date Taking? Authorizing Provider  albuterol (VENTOLIN HFA) 108 (90 Base) MCG/ACT inhaler Inhale 2 puffs into the lungs every 4 (four) hours as needed for wheezing or shortness of breath. 04/04/20  Yes Roxan Hockey, MD      Allergies    Patient has no known allergies.    Review of Systems   Review of Systems  Constitutional:  Positive for fatigue.  Respiratory:   Positive for shortness of breath.   Neurological:  Positive for dizziness, weakness (Generalized) and light-headedness. Syncope: Near syncope. All other systems reviewed and are negative.  Physical Exam Updated Vital Signs BP (!) 95/48 (BP Location: Left Arm)    Pulse 77    Temp 98 F (36.7 C)    Resp 17    Ht 6' (1.829 m)    Wt 54 kg    SpO2 94%    BMI 16.15 kg/m  Physical Exam Vitals and nursing note reviewed.  Constitutional:      General: He is not in acute distress.    Appearance: Normal appearance. He is well-developed and underweight.  HENT:     Head: Normocephalic and atraumatic.     Right Ear: External ear normal.     Left Ear: External ear normal.     Nose: Nose normal.     Mouth/Throat:     Mouth: Mucous membranes are moist.     Pharynx: Oropharynx is clear.  Eyes:     General: No scleral icterus.    Extraocular Movements: Extraocular movements intact.     Conjunctiva/sclera: Conjunctivae normal.  Cardiovascular:     Rate and Rhythm: Normal rate and regular rhythm.     Heart sounds: No murmur heard. Pulmonary:     Effort: Pulmonary effort is normal. No respiratory  distress.     Breath sounds: Normal breath sounds. No wheezing, rhonchi or rales.     Comments: Port in left upper chest Chest:     Chest wall: No tenderness.  Abdominal:     General: Abdomen is flat.     Palpations: Abdomen is soft.     Tenderness: There is no abdominal tenderness.  Musculoskeletal:        General: No swelling. Normal range of motion.     Cervical back: Normal range of motion and neck supple. No rigidity.     Right lower leg: No edema.     Left lower leg: No edema.  Skin:    General: Skin is warm and dry.     Capillary Refill: Capillary refill takes less than 2 seconds.     Coloration: Skin is not jaundiced.  Neurological:     General: No focal deficit present.     Mental Status: He is alert and oriented to person, place, and time.     Cranial Nerves: No cranial nerve deficit.      Sensory: No sensory deficit.     Motor: No weakness.     Coordination: Coordination normal.  Psychiatric:        Mood and Affect: Mood normal.        Behavior: Behavior normal.    ED Results / Procedures / Treatments   Labs (all labs ordered are listed, but only abnormal results are displayed) Labs Reviewed  LACTIC ACID, PLASMA - Abnormal; Notable for the following components:      Result Value   Lactic Acid, Venous 2.1 (*)    All other components within normal limits  COMPREHENSIVE METABOLIC PANEL - Abnormal; Notable for the following components:   Sodium 133 (*)    Potassium 3.4 (*)    CO2 21 (*)    BUN 38 (*)    Creatinine, Ser 1.96 (*)    Total Protein 6.1 (*)    Albumin 2.9 (*)    GFR, Estimated 32 (*)    Anion gap 3 (*)    All other components within normal limits  CBC WITH DIFFERENTIAL/PLATELET - Abnormal; Notable for the following components:   RBC 2.78 (*)    Hemoglobin 7.0 (*)    HCT 22.9 (*)    MCH 25.2 (*)    RDW 19.2 (*)    Platelets 31 (*)    nRBC 0.4 (*)    Lymphs Abs 0.4 (*)    Abs Immature Granulocytes 0.15 (*)    All other components within normal limits  URINALYSIS, ROUTINE W REFLEX MICROSCOPIC - Abnormal; Notable for the following components:   Hgb urine dipstick TRACE (*)    Protein, ur TRACE (*)    All other components within normal limits  URINALYSIS, MICROSCOPIC (REFLEX) - Abnormal; Notable for the following components:   Bacteria, UA FEW (*)    All other components within normal limits  TROPONIN I (HIGH SENSITIVITY) - Abnormal; Notable for the following components:   Troponin I (High Sensitivity) 19 (*)    All other components within normal limits  TROPONIN I (HIGH SENSITIVITY) - Abnormal; Notable for the following components:   Troponin I (High Sensitivity) 19 (*)    All other components within normal limits  CULTURE, BLOOD (ROUTINE X 2)  CULTURE, BLOOD (ROUTINE X 2)  RESP PANEL BY RT-PCR (FLU A&B, COVID) ARPGX2  LACTIC ACID, PLASMA   PROTIME-INR  BRAIN NATRIURETIC PEPTIDE  MAGNESIUM  BASIC METABOLIC PANEL  CBC  TYPE AND SCREEN  PREPARE RBC (CROSSMATCH)    EKG None  Radiology DG Chest Port 1 View  Result Date: 06/16/2021 CLINICAL DATA:  An 88 year old male presents with question of sepsis. EXAM: PORTABLE CHEST 1 VIEW COMPARISON:  February 15, 2021 radiographs and CT imaging from that date. FINDINGS: EKG leads project over the chest. LEFT-sided Port-A-Cath terminates at the caval to atrial junction. Cardiomediastinal contours and hilar structures are stable elongated due to hyperinflation that has been present over a series of prior studies. Interstitial thickening which was present previously is is perhaps slightly worse compared to the study of February 15, 2021. No lobar consolidative process.  No visible pneumothorax. Signs of cement augmentation and thoracic lumbar vertebral bodies not well evaluated. IMPRESSION: Chronic interstitial thickening with some worsening may reflect worsening of background interstitial lung disease. The possibility of superimposed infection is not excluded. No signs of lobar consolidation or visible evidence of pleural effusion. Electronically Signed   By: Zetta Bills M.D.   On: 06/16/2021 09:05    Procedures Procedures    Medications Ordered in ED Medications  albuterol (VENTOLIN HFA) 108 (90 Base) MCG/ACT inhaler 2 puff (has no administration in time range)  sodium chloride flush (NS) 0.9 % injection 3 mL (3 mLs Intravenous Given 06/16/21 2049)  lactated ringers infusion ( Intravenous Stopped 06/16/21 2331)  acetaminophen (TYLENOL) tablet 650 mg (has no administration in time range)    Or  acetaminophen (TYLENOL) suppository 650 mg (has no administration in time range)  oxyCODONE (Oxy IR/ROXICODONE) immediate release tablet 5 mg (has no administration in time range)  morphine 2 MG/ML injection 2 mg (has no administration in time range)  docusate sodium (COLACE) capsule 100 mg (100 mg  Oral Given 06/16/21 2047)  polyethylene glycol (MIRALAX / GLYCOLAX) packet 17 g (has no administration in time range)  bisacodyl (DULCOLAX) EC tablet 5 mg (has no administration in time range)  ondansetron (ZOFRAN) tablet 4 mg (has no administration in time range)    Or  ondansetron (ZOFRAN) injection 4 mg (has no administration in time range)  hydrALAZINE (APRESOLINE) injection 5 mg (has no administration in time range)  Chlorhexidine Gluconate Cloth 2 % PADS 6 each (has no administration in time range)  lactated ringers bolus 500 mL (0 mLs Intravenous Stopped 06/16/21 1021)    ED Course/ Medical Decision Making/ A&P                           Medical Decision Making Amount and/or Complexity of Data Reviewed Labs: ordered. Radiology: ordered. ECG/medicine tests: ordered.  Risk Decision regarding hospitalization.   This patient presents to the ED for concern of near syncope, this involves an extensive number of treatment options, and is a complaint that carries with it a high risk of complications and morbidity.  The differential diagnosis includes sepsis, cardiogenic shock, dehydration, electrolyte abnormalities, polypharmacy  MDM:    88 year old male with history of MDS, presenting for symptoms of near syncope at home.  The symptoms were present with standing yesterday but worsened today.  EMS noted a SBP of 68 on scene.  He was given 700 cc of IVF prior to arrival.  On arrival, blood pressures in the range of 100s over 60s.  He does state that, at baseline, his blood pressures are in the low range of normal.  Symptoms have improved following IV fluids.  He remains on his 3 L of supplemental oxygen, which is his baseline.  He has no current areas of pain.  Work-up was initiated for possible sepsis and/or other etiologies of his recent symptoms and hypotension this morning.  There was a slight delay in obtaining lab work.  During this time, patient was kept on bedside cardiac monitor under  observation.  He continued to have blood pressures in the low range of normal, which is his baseline.  He continued to deny any current symptoms.  Work showed a hemoglobin of 7.0.  Patient's baseline hemoglobin ranges in the 8 to 9 g/dL range.  In the past, he has had a transfusion threshold of near 7 with his last blood transfusion being given with a hemoglobin of 7.1.  In the past, he has had remarkable improvement in symptoms following blood transfusions.  Patient's lab work was otherwise unremarkable.  His creatinine was baseline.  His electrolytes were grossly normal.  He does not have a leukocytosis.  Thrombocytopenia is baseline.  Troponin was slightly elevated but no change noted on delta.  I do feel that patient symptoms of weakness for 2 days and near-syncope this morning are from acute on chronic anemia.  Patient was consented for blood transfusion.  He was admitted to hospitalist for further management.  Labs: I Ordered, and personally interpreted labs.  The pertinent results include: Acute on chronic anemia  Imaging Studies ordered: I ordered imaging studies including chest x-ray I independently visualized and interpreted imaging which showed no acute findings. I agree with the radiologist interpretation  Additional history obtained from EMS.  External records from outside source obtained and reviewed including EMR  Critical Interventions: PRBC transfusion for symptomatic anemia  Cardiac Monitoring: The patient was maintained on a cardiac monitor.  I personally viewed and interpreted the cardiac monitored which showed an underlying rhythm of: Sinus rhythm  Reevaluation: After the interventions noted above, I reevaluated the patient and found that they have :improved   Considered admission for: Symptomatic anemia  Social Determinants of Health: Chronic illness  Disposition: Admission to hospitalist  Co morbidities that complicate the patient evaluation  Past Medical  History:  Diagnosis Date   Bone cancer ([place])    Chronic kidney insufficiency    COPD (chronic obstructive pulmonary disease) ([facility])    GERD (gastroesophageal reflux disease)    Pulmonary nodule      Medicines Meds ordered this encounter  Medications   lactated ringers bolus 500 mL   DISCONTD: nitroGLYCERIN 0.2 mg/mL in dextrose 5 % infusion    Adela Ports D: cabinet override   DISCONTD: 0.9 %  sodium chloride infusion   albuterol (VENTOLIN HFA) 108 (90 Base) MCG/ACT inhaler 2 puff   sodium chloride flush (NS) 0.9 % injection 3 mL   lactated ringers infusion   OR Linked Order Group    acetaminophen (TYLENOL) tablet 650 mg    acetaminophen (TYLENOL) suppository 650 mg   oxyCODONE (Oxy IR/ROXICODONE) immediate release tablet 5 mg   morphine 2 MG/ML injection 2 mg   docusate sodium (COLACE) capsule 100 mg   polyethylene glycol (MIRALAX / GLYCOLAX) packet 17 g   bisacodyl (DULCOLAX) EC tablet 5 mg   OR Linked Order Group    ondansetron (ZOFRAN) tablet 4 mg    ondansetron (ZOFRAN) injection 4 mg   hydrALAZINE (APRESOLINE) injection 5 mg   Chlorhexidine Gluconate Cloth 2 % PADS 6 each    I have reviewed the patients home medicines and have made adjustments as needed  Problem List / ED Course: Problem List Items Addressed This Visit  Other   * (Principal) Symptomatic anemia    -Patient presenting with near syncope/syncope in the setting of known recurrent symptomatic anemia -In review of labs, his Hgb has been downtrending over the last month -Hgb 7 -Will observe overnight on telemetry -Transfuse 1 unit PRBC to start and recheck Hgb in AM -Assuming improved symptomatology, he may be appropriate for dc to home in AM      Other Visit Diagnoses     Generalized weakness    -  Primary   Near syncope       Relevant Medications   hydrALAZINE (APRESOLINE) injection 5 mg       CRITICAL CARE Performed by: Godfrey Pick   Total critical care time: 32  minutes  Critical care time was exclusive of separately billable procedures and treating other patients.  Critical care was necessary to treat or prevent imminent or life-threatening deterioration.  Critical care was time spent personally by me on the following activities: development of treatment plan with patient and/or surrogate as well as nursing, discussions with consultants, evaluation of patient's response to treatment, examination of patient, obtaining history from patient or surrogate, ordering and performing treatments and interventions, ordering and review of laboratory studies, ordering and review of radiographic studies, pulse oximetry and re-evaluation of patient's condition.             Final Clinical Impression(s) / ED Diagnoses Final diagnoses:  Generalized weakness  Symptomatic anemia  Near syncope    Rx / DC Orders ED Discharge Orders     None         Godfrey Pick, MD 06/17/21 [phone]

## 2021-06-16 NOTE — H&P (Signed)
History and Physical    Patient: [name] OJJ:[id number] DOB: April 08, 1930 DOA: 06/16/2021 DOS: the patient was seen and examined on 06/16/2021 PCP: Sharilyn Sites, MD  Patient coming from: Home - lives alone; NOK: Daughter, Gar Gibbon, [phone]   Chief Complaint: Weakness  HPI: [name] is a 88 y.o. male with medical history significant of MDS; COPD; and CKD presenting with weakness, EMS found hypotension.  He reports that he got dressed this AM to go to the cancer center and he was dizzy.  He sat back down and did it again and was still dizzy.  He passed out when EMS arrived, could tell it was coming.  Yesterday he felt great with no problems.  He has had these episodes in the past, but not yesterday.  He has bone cancer x 3 years and today he was supposed to have his chemo increased.  He goes 5 times a week for treatments.  He periodically needs blood transfusions, usually every 2 weeks.  This is how he usually feels when he needs blood.  He was last seen by Dr. Delton Coombes on 1/16 and by bone marrow biopsy in 07/2020 he had progressed to the high risk category.  He routinely goes for treatment with azacitidine 5x/week and there has been discussion about increasing the dose.     ER Course:  Has MDS, h/o need for transfusions.  Has chronic anemia, scheduled for infusion today.  Near syncope yesterday, worse this AM.  BP 60 with EMS, given 700 cc IVF with EMS and more here.  Hgb 7.0. Likely symptomatic anemia, ?mild dehydration.  Given 1 unit PRBC.     Review of Systems: As mentioned in the history of present illness. All other systems reviewed and are negative. Past Medical History:  Diagnosis Date   Bone cancer ([place])    Chronic kidney insufficiency    COPD (chronic obstructive pulmonary disease) (HCC)    GERD (gastroesophageal reflux disease)    Pulmonary nodule    Past Surgical History:  Procedure Laterality Date   BACK SURGERY     CATARACT EXTRACTION W/PHACO  Right 03/09/2016   Procedure: CATARACT EXTRACTION PHACO AND INTRAOCULAR LENS PLACEMENT RIGHT EYE CDE=8.58;  Surgeon: Tonny Branch, MD;  Location: [facility];  Service: Ophthalmology;  Laterality: Right;  right   CATARACT EXTRACTION W/PHACO Left 04/13/2016   Procedure: CATARACT EXTRACTION PHACO AND INTRAOCULAR LENS PLACEMENT (IOC);  Surgeon: Tonny Branch, MD;  Location: [facility];  Service: Ophthalmology;  Laterality: Left;  CDE: 8.08   KYPHOPLASTY Bilateral 06/09/2019   Procedure: T11 KYPHOPLASTY;  Surgeon: Consuella Lose, MD;  Location: [facility];  Service: Neurosurgery;  Laterality: Bilateral;   None to Date  10/01/15   PORTACATH PLACEMENT Left 10/11/2020   Procedure: INSERTION PORT-A-CATH (attached catheter in left internal jugular);  Surgeon: Virl Cagey, MD;  Location: [facility];  Service: General;  Laterality: Left;   Social History:  reports that he quit smoking about 30 years ago. His smoking use included cigarettes and cigars. He has never used smokeless tobacco. He reports that he does not currently use alcohol. He reports that he does not use drugs.  No Known Allergies  Family History  Problem Relation Age of Onset   Kidney disease Mother    Kidney disease Sister    Colon cancer Neg Hx    Gastric cancer Neg Hx    Esophageal cancer Neg Hx     Prior to Admission medications   Medication Sig Start Date End  Date Taking? Authorizing Provider  albuterol (VENTOLIN HFA) 108 (90 Base) MCG/ACT inhaler Inhale 2 puffs into the lungs every 4 (four) hours as needed for wheezing or shortness of breath. 04/04/20  Yes Roxan Hockey, MD  acetaminophen (TYLENOL) 325 MG tablet Take 2 tablets (650 mg total) by mouth every 6 (six) hours as needed for mild pain, fever or headache (or Fever >/= 101). Patient not taking: Reported on 06/16/2021 04/04/20   Roxan Hockey, MD  cefdinir (OMNICEF) 300 MG capsule Take 1 capsule (300 mg total) by mouth daily. For UTI Patient not taking: Reported on 06/16/2021  02/24/21   Harriett Rush, PA-C  megestrol (MEGACE) 400 MG/10ML suspension Take 10 mLs (400 mg total) by mouth 2 (two) times daily. Patient not taking: Reported on 06/16/2021 02/10/21   Derek Jack, MD  methylphenidate (RITALIN) 5 MG tablet Take 1 tablet (5 mg total) by mouth daily as needed (take 1 tablet by mouth every morning as needed for severe fatigue). Patient not taking: Reported on 06/16/2021 06/02/21   Derek Jack, MD  nitrofurantoin, macrocrystal-monohydrate, (MACROBID) 100 MG capsule Take 1 capsule (100 mg total) by mouth every 12 (twelve) hours. Patient not taking: Reported on 06/16/2021 03/26/21   Cleon Gustin, MD  ondansetron (ZOFRAN) 4 MG tablet Take 1 tablet (4 mg total) by mouth every 6 (six) hours as needed for nausea or vomiting. Patient not taking: Reported on 8/67/5449 06/18/98   Delora Fuel, MD  prochlorperazine (COMPAZINE) 10 MG tablet Take 1 tablet (10 mg total) by mouth every 6 (six) hours as needed for nausea or vomiting. Patient not taking: Reported on 06/16/2021 09/13/20   Derek Jack, MD  silodosin (RAPAFLO) 4 MG CAPS capsule Take 1 capsule (4 mg total) by mouth at bedtime. Patient not taking: Reported on 06/16/2021 04/14/21   Derek Jack, MD  tiotropium (SPIRIVA HANDIHALER) 18 MCG inhalation capsule Place 1 capsule (18 mcg total) into inhaler and inhale daily. Patient not taking: Reported on 06/16/2021 04/04/20   Roxan Hockey, MD    Physical Exam: Vitals:   06/16/21 1430 06/16/21 1500 06/16/21 1530 06/16/21 1824  BP: (!) 94/58 (!) 91/59 (!) 100/58 (!) 94/54  Pulse: 71 71  73  Resp: '20 17 16 17  ' Temp:    (!) 97.5 F (36.4 C)  TempSrc:    Oral  SpO2: 98% 99%  94%  Weight:      Height:       General:  Appears calm and comfortable and is in NAD; very cognitively intact Eyes:   EOMI, normal lids, iris ENT:  grossly normal hearing, lips & tongue, mmm; artificial dentition Neck:  no LAD, masses or  thyromegaly Cardiovascular:  RRR, no m/r/g. No LE edema.  Respiratory:   CTA bilaterally with no wheezes/rales/rhonchi.  Normal respiratory effort. Abdomen:  soft, NT, ND Skin:  no rash or induration seen on limited exam Musculoskeletal:  grossly normal tone BUE/BLE, good ROM, no bony abnormality Psychiatric:  grossly normal mood and affect, speech fluent and appropriate, AOx3 Neurologic:  CN 2-12 grossly intact, moves all extremities in coordinated fashion   Radiological Exams on Admission: Independently reviewed - see discussion in A/P where applicable  DG Chest Port 1 View  Result Date: 06/16/2021 CLINICAL DATA:  An 88 year old male presents with question of sepsis. EXAM: PORTABLE CHEST 1 VIEW COMPARISON:  February 15, 2021 radiographs and CT imaging from that date. FINDINGS: EKG leads project over the chest. LEFT-sided Port-A-Cath terminates at the caval to atrial junction. Cardiomediastinal contours  and hilar structures are stable elongated due to hyperinflation that has been present over a series of prior studies. Interstitial thickening which was present previously is is perhaps slightly worse compared to the study of February 15, 2021. No lobar consolidative process.  No visible pneumothorax. Signs of cement augmentation and thoracic lumbar vertebral bodies not well evaluated. IMPRESSION: Chronic interstitial thickening with some worsening may reflect worsening of background interstitial lung disease. The possibility of superimposed infection is not excluded. No signs of lobar consolidation or visible evidence of pleural effusion. Electronically Signed   By: Zetta Bills M.D.   On: 06/16/2021 09:05    EKG: Independently reviewed.  NSR with rate 79; no evidence of acute ischemia   Labs on Admission: I have personally reviewed the available labs and imaging studies at the time of the admission.  Pertinent labs:    BUN 38/Creatinine 1.96/GFR 32 - stable HS troponin 19, 19 BNP 52 Lactate  2.1, 1.1 WBC 5.0 Hgb 7.0; 9.1 on 1/9, 8.1 on 1/16, 8.0 on 1/23 Platelets 31 - stable COVID/flu negative   Assessment and Plan: * Symptomatic anemia- (present on admission) -Patient presenting with near syncope/syncope in the setting of known recurrent symptomatic anemia -In review of labs, his Hgb has been downtrending over the last month -Hgb 7 -Will observe overnight on telemetry -Transfuse 1 unit PRBC to start and recheck Hgb in AM -Assuming improved symptomatology, he may be appropriate for dc to home in AM  Chronic respiratory failure with hypoxia /// 3L/min at baseline- (present on admission) -On home 3-4L [place] O2 currently -His ONLY listed home med is prn Albuterol, will continue  CKD (chronic kidney disease), stage IIIB- (present on admission) -Advanced CKD, appears to be stable -Recheck BMP in AM  Myelodysplastic syndrome ([place])- (present on admission) -Patient with 3 years of MDS -He reports receiving chemotherapy 5 days a week and is due for increased dose -He reports that the chemotherapy does not appear to have improved his symptoms -Will consult palliative care -For now, he reports that he would prefer to die naturally but that his 3 children came in from their homes all over the country recently and strongly encouraged him to keep going and so he will honor their wishes and be full code -Dr. Delton Coombes has been added to the treatment team  Thrombocytopenia ([place])- (present on admission) -Likely due to chemotherapy -Appears to be stable at this time -No intervention for now       Advance Care Planning:   Code Status: Full Code   Consults: Palliative care; Oncology named to treatment team  Family Communication: None present; he declined to have me call family at the time of admission  Severity of Illness: The appropriate patient status for this patient is OBSERVATION. Observation status is judged to be reasonable and necessary in order to provide the required  intensity of service to ensure the patient's safety. The patient's presenting symptoms, physical exam findings, and initial radiographic and laboratory data in the context of their medical condition is felt to place them at decreased risk for further clinical deterioration. Furthermore, it is anticipated that the patient will be medically stable for discharge from the hospital within 2 midnights of admission.   Author: Karmen Bongo, MD 06/16/2021 7:30 PM  For on call review [email or web address].

## 2021-06-16 NOTE — Assessment & Plan Note (Addendum)
-  Patient with 3 years of MDS -He reports receiving chemotherapy 5 days a week and is due for increased dose -He reports that the chemotherapy does not appear to have improved his symptoms -Dr. Delton Coombes has been added to the treatment team

## 2021-06-16 NOTE — Assessment & Plan Note (Addendum)
-  Advanced CKD, appears to be stable

## 2021-06-16 NOTE — ED Notes (Signed)
Patient given ginger ale and food tray at this time.

## 2021-06-16 NOTE — ED Notes (Signed)
One set of blood cultures obtained at this time

## 2021-06-16 NOTE — ED Triage Notes (Signed)
Patient states he has had no appetite and has not really been eating. Patient states he was supposed to get tx for bone marrow this morning and got up and felt very dizzy and felt like he was going tro pass out. Patient has hx of blood transfusion. EMS states 68 BP on scene and now 100s in triage. CBG 129.

## 2021-06-16 NOTE — Assessment & Plan Note (Addendum)
-  On 3-4L [place] O2 currently -His ONLY listed home med is prn Albuterol, will continue

## 2021-06-16 NOTE — Assessment & Plan Note (Signed)
-  Likely due to chemotherapy -Appears to be stable at this time -No intervention for now

## 2021-06-16 NOTE — Plan of Care (Signed)

## 2021-06-17 ENCOUNTER — Ambulatory Visit (HOSPITAL_COMMUNITY): Payer: Medicare Other

## 2021-06-17 ENCOUNTER — Encounter (HOSPITAL_COMMUNITY): Payer: Self-pay | Admitting: Internal Medicine

## 2021-06-17 DIAGNOSIS — Z7189 Other specified counseling: Secondary | ICD-10-CM

## 2021-06-17 DIAGNOSIS — R55 Syncope and collapse: Secondary | ICD-10-CM

## 2021-06-17 DIAGNOSIS — Z515 Encounter for palliative care: Secondary | ICD-10-CM

## 2021-06-17 DIAGNOSIS — D649 Anemia, unspecified: Secondary | ICD-10-CM | POA: Diagnosis not present

## 2021-06-17 LAB — CBC
HCT: 24.8 % — ABNORMAL LOW (ref 39.0–52.0)
Hemoglobin: 8 g/dL — ABNORMAL LOW (ref 13.0–17.0)
MCH: 26.7 pg (ref 26.0–34.0)
MCHC: 32.3 g/dL (ref 30.0–36.0)
MCV: 82.7 fL (ref 80.0–100.0)
Platelets: 31 10*3/uL — ABNORMAL LOW (ref 150–400)
RBC: 3 MIL/uL — ABNORMAL LOW (ref 4.22–5.81)
RDW: 17.7 % — ABNORMAL HIGH (ref 11.5–15.5)
WBC: 5.3 10*3/uL (ref 4.0–10.5)
nRBC: 0.6 % — ABNORMAL HIGH (ref 0.0–0.2)

## 2021-06-17 LAB — BASIC METABOLIC PANEL
Anion gap: 5 (ref 5–15)
BUN: 37 mg/dL — ABNORMAL HIGH (ref 8–23)
CO2: 21 mmol/L — ABNORMAL LOW (ref 22–32)
Calcium: 8.8 mg/dL — ABNORMAL LOW (ref 8.9–10.3)
Chloride: 109 mmol/L (ref 98–111)
Creatinine, Ser: 2.04 mg/dL — ABNORMAL HIGH (ref 0.61–1.24)
GFR, Estimated: 31 mL/min — ABNORMAL LOW (ref >60)
Glucose, Bld: 97 mg/dL (ref 70–99)
Potassium: 3.6 mmol/L (ref 3.5–5.1)
Sodium: 135 mmol/L (ref 135–145)

## 2021-06-17 LAB — TYPE AND SCREEN
ABO/RH(D): B NEG
Antibody Screen: NEGATIVE
Unit division: 0

## 2021-06-17 LAB — BPAM RBC
Blood Product Expiration Date: 202302212359
ISSUE DATE / TIME: 202301302325
Unit Type and Rh: 1700

## 2021-06-17 MED ORDER — HEPARIN SOD (PORK) LOCK FLUSH 100 UNIT/ML IV SOLN
500.0000 [IU] | Freq: Once | INTRAVENOUS | Status: AC
  Administered 2021-06-17: 500 [IU] via INTRAVENOUS
  Filled 2021-06-17: qty 5

## 2021-06-17 NOTE — Assessment & Plan Note (Signed)
Has frequent episodes- orthostatics negative

## 2021-06-17 NOTE — Discharge Summary (Signed)
Physician Discharge Summary  [name] LXB:[id number] DOB: May 24, 1929 DOA: 06/16/2021  PCP: Sharilyn Sites, MD  Admit date: 06/16/2021 Discharge date: 06/17/2021  Admitted From: home Discharge disposition: home   Recommendations for Outpatient Follow-Up:   Needs continued palliative care talks Cbc 1 week   Discharge Diagnosis:   Principal Problem:   Symptomatic anemia Active Problems:   Myelodysplastic syndrome (HCC)   Thrombocytopenia (HCC)   CKD (chronic kidney disease), stage IIIB   Chronic respiratory failure with hypoxia /// 3L/min at baseline   Pre-syncope    Discharge Condition: Improved.  Diet recommendation:  Regular.  Wound care: None.  Code status: Full.   History of Present Illness:   [name] is a 88 y.o. male with medical history significant of MDS; COPD; and CKD presenting with weakness, EMS found hypotension.  He reports that he got dressed this AM to go to the cancer center and he was dizzy.  He sat back down and did it again and was still dizzy.  He passed out when EMS arrived, could tell it was coming.  Yesterday he felt great with no problems.  He has had these episodes in the past, but not yesterday.  He has bone cancer x 3 years and today he was supposed to have his chemo increased.  He goes 5 times a week for treatments.  He periodically needs blood transfusions, usually every 2 weeks.  This is how he usually feels when he needs blood.   He was last seen by Dr. Delton Coombes on 1/16 and by bone marrow biopsy in 07/2020 he had progressed to the high risk category.  He routinely goes for treatment with azacitidine 5x/week and there has been discussion about increasing the dose.      Hospital Course by Problem:   Symptomatic anemia- (present on admission) -Patient presenting with near syncope/syncope in the setting of known recurrent symptomatic anemia -In review of labs, his Hgb has been downtrending over the last month -Hgb 7-  s/p transfusion and now 8 -PT/OT eval-- home health   Myelodysplastic syndrome ([place])- (present on admission) -Patient with 3 years of MDS -He reports receiving chemotherapy 5 days a week and is due for increased dose -He reports that the chemotherapy does not appear to have improved his symptoms -Dr. Delton Coombes has been added to the treatment team -needs continued palliative care talks   Pre-syncope Has frequent episodes- orthostatics negative   Chronic respiratory failure with hypoxia /// 3L/min at baseline- (present on admission) -On 3-4L [place] O2 currently -His ONLY listed home med is prn Albuterol, will continue   CKD (chronic kidney disease), stage IIIB- (present on admission) -Advanced CKD, appears to be stable     Thrombocytopenia ([place])- (present on admission) -Likely due to chemotherapy -Appears to be stable at this time -No intervention for now     GOC: patient met with palliative care: patient has had conversations with family-- family want him to remain a full code.  Outpatient palliative care referral      Medical Consultants:   Palliative care   Discharge Exam:   Vitals:   06/17/21 0515 06/17/21 1436  BP: (!) 95/48 103/60  Pulse: 77 70  Resp: 17   Temp: 98 F (36.7 C) 98 F (36.7 C)  SpO2: 94% 97%   Vitals:   06/17/21 0149 06/17/21 0327 06/17/21 0515 06/17/21 1436  BP: (!) 108/57 (!) 111/58 (!) 95/48 103/60  Pulse: 74 74 77 70  Resp: 18  17 17   Temp: 98.4 F (36.9 C) 98.5 F (36.9 C) 98 F (36.7 C) 98 F (36.7 C)  TempSrc: Oral Oral  Oral  SpO2: 98% 97% 94% 97%  Weight:      Height:        General exam: Appears calm and comfortable.  -asking to go home  The results of significant diagnostics from this hospitalization (including imaging, microbiology, ancillary and laboratory) are listed below for reference.     Procedures and Diagnostic Studies:   DG Chest Port 1 View  Result Date: 06/16/2021 CLINICAL DATA:  An 88 year old male  presents with question of sepsis. EXAM: PORTABLE CHEST 1 VIEW COMPARISON:  February 15, 2021 radiographs and CT imaging from that date. FINDINGS: EKG leads project over the chest. LEFT-sided Port-A-Cath terminates at the caval to atrial junction. Cardiomediastinal contours and hilar structures are stable elongated due to hyperinflation that has been present over a series of prior studies. Interstitial thickening which was present previously is is perhaps slightly worse compared to the study of February 15, 2021. No lobar consolidative process.  No visible pneumothorax. Signs of cement augmentation and thoracic lumbar vertebral bodies not well evaluated. IMPRESSION: Chronic interstitial thickening with some worsening may reflect worsening of background interstitial lung disease. The possibility of superimposed infection is not excluded. No signs of lobar consolidation or visible evidence of pleural effusion. Electronically Signed   By: Zetta Bills M.D.   On: 06/16/2021 09:05     Labs:   Basic Metabolic Panel: Recent Labs  Lab 06/16/21 1134 06/17/21 0537  NA 133* 135  K 3.4* 3.6  CL 109 109  CO2 21* 21*  GLUCOSE 90 97  BUN 38* 37*  CREATININE 1.96* 2.04*  CALCIUM 9.1 8.8*  MG 2.0  --    GFR Estimated Creatinine Clearance: 18.8 mL/min (A) (by C-G formula based on SCr of 2.04 mg/dL (H)). Liver Function Tests: Recent Labs  Lab 06/16/21 1134  AST 17  ALT 11  ALKPHOS 59  BILITOT 0.8  PROT 6.1*  ALBUMIN 2.9*   No results for input(s): LIPASE, AMYLASE in the last 168 hours. No results for input(s): AMMONIA in the last 168 hours. Coagulation profile Recent Labs  Lab 06/16/21 1134  INR 1.1    CBC: Recent Labs  Lab 06/16/21 1134 06/17/21 0537  WBC 5.0 5.3  NEUTROABS 4.2  --   HGB 7.0* 8.0*  HCT 22.9* 24.8*  MCV 82.4 82.7  PLT 31* 31*   Cardiac Enzymes: No results for input(s): CKTOTAL, CKMB, CKMBINDEX, TROPONINI in the last 168 hours. BNP: Invalid input(s):  POCBNP CBG: No results for input(s): GLUCAP in the last 168 hours. D-Dimer No results for input(s): DDIMER in the last 72 hours. Hgb A1c No results for input(s): HGBA1C in the last 72 hours. Lipid Profile No results for input(s): CHOL, HDL, LDLCALC, TRIG, CHOLHDL, LDLDIRECT in the last 72 hours. Thyroid function studies No results for input(s): TSH, T4TOTAL, T3FREE, THYROIDAB in the last 72 hours.  Invalid input(s): FREET3 Anemia work up No results for input(s): VITAMINB12, FOLATE, FERRITIN, TIBC, IRON, RETICCTPCT in the last 72 hours. Microbiology Recent Results (from the past 240 hour(s))  Resp Panel by RT-PCR (Flu A&B, Covid) Nasopharyngeal Swab     Status: None   Collection Time: 06/16/21  9:34 AM   Specimen: Nasopharyngeal Swab; Nasopharyngeal(NP) swabs in vial transport medium  Result Value Ref Range Status   SARS Coronavirus 2 by RT PCR NEGATIVE NEGATIVE Final    Comment: (  NOTE) SARS-CoV-2 target nucleic acids are NOT DETECTED.  The SARS-CoV-2 RNA is generally detectable in upper respiratory specimens during the acute phase of infection. The lowest concentration of SARS-CoV-2 viral copies this assay can detect is 138 copies/mL. A negative result does not preclude SARS-Cov-2 infection and should not be used as the sole basis for treatment or other patient management decisions. A negative result may occur with  improper specimen collection/handling, submission of specimen other than nasopharyngeal swab, presence of viral mutation(s) within the areas targeted by this assay, and inadequate number of viral copies(<138 copies/mL). A negative result must be combined with clinical observations, patient history, and epidemiological information. The expected result is Negative.  Fact Sheet for Patients:  EntrepreneurPulse.com.au  Fact Sheet for Healthcare Providers:  IncredibleEmployment.be  This test is no t yet approved or cleared by the  [place] FDA and  has been authorized for detection and/or diagnosis of SARS-CoV-2 by FDA under an Emergency Use Authorization (EUA). This EUA will remain  in effect (meaning this test can be used) for the duration of the COVID-19 declaration under Section 564(b)(1) of the Act, 21 U.S.C.section 360bbb-3(b)(1), unless the authorization is terminated  or revoked sooner.       Influenza A by PCR NEGATIVE NEGATIVE Final   Influenza B by PCR NEGATIVE NEGATIVE Final    Comment: (NOTE) The Xpert Xpress SARS-CoV-2/FLU/RSV plus assay is intended as an aid in the diagnosis of influenza from Nasopharyngeal swab specimens and should not be used as a sole basis for treatment. Nasal washings and aspirates are unacceptable for Xpert Xpress SARS-CoV-2/FLU/RSV testing.  Fact Sheet for Patients: EntrepreneurPulse.com.au  Fact Sheet for Healthcare Providers: IncredibleEmployment.be  This test is not yet approved or cleared by the [place] FDA and has been authorized for detection and/or diagnosis of SARS-CoV-2 by FDA under an Emergency Use Authorization (EUA). This EUA will remain in effect (meaning this test can be used) for the duration of the COVID-19 declaration under Section 564(b)(1) of the Act, 21 U.S.C. section 360bbb-3(b)(1), unless the authorization is terminated or revoked.  Performed at [name], [address], [place], [place] [address]   Blood Culture (routine x 2)     Status: None (Preliminary result)   Collection Time: 06/16/21 11:34 AM   Specimen: BLOOD  Result Value Ref Range Status   Specimen Description BLOOD LEFT BRACHIAL  Final   Special Requests   Final    BOTTLES DRAWN AEROBIC AND ANAEROBIC Blood Culture adequate volume   Culture   Final    NO GROWTH < 24 HOURS Performed at [facility], [address], [place], [place] [address]    Report Status PENDING  Incomplete  Blood Culture (routine x 2)     Status: None  (Preliminary result)   Collection Time: 06/16/21 11:40 AM   Specimen: BLOOD  Result Value Ref Range Status   Specimen Description BLOOD BLOOD RIGHT FOREARM  Final   Special Requests   Final    BOTTLES DRAWN AEROBIC AND ANAEROBIC Blood Culture results may not be optimal due to an inadequate volume of blood received in culture bottles   Culture   Final    NO GROWTH < 24 HOURS Performed at [facility], [address], [place], [place] [address]    Report Status PENDING  Incomplete     Discharge Instructions:   Discharge Instructions     Diet general   Complete by: As directed    Discharge instructions   Complete by: As  directed    Cbc within 1 week-- may needs scheduled blood transfusions to avoid these episodes   Increase activity slowly   Complete by: As directed       Allergies as of 06/17/2021   No Known Allergies      Medication List     TAKE these medications    albuterol 108 (90 Base) MCG/ACT inhaler Commonly known as: VENTOLIN HFA Inhale 2 puffs into the lungs every 4 (four) hours as needed for wheezing or shortness of breath.        Follow-up Information     Sharilyn Sites, MD Follow up in 1 week(s).   Specialty: Family Medicine Why: cbc Contact information: [address] [place] [address] [phone]         Derek Jack, MD Follow up.   Specialty: Hematology Contact information: [place] [address] [phone]                  Time coordinating discharge: 25 min  Signed:  Geradine Girt DO  [facility] 06/17/2021, 3:05 PM

## 2021-06-17 NOTE — Consult Note (Signed)
Consultation Note Date: 06/17/2021   Patient Name: [name]  DOB: 1929/08/27  MRN: [id number]  Age / Sex: 88 y.o., male  PCP: Sharilyn Sites, MD Referring Physician: Geradine Girt, DO  Reason for Consultation: Establishing goals of care  HPI/Patient Profile: 88 y.o. male  with past medical history of MDS, COPD, CKD, bone cancer 3 years ago active with Dr. Raliegh Ip, seen on 1/16 actively receiving chemotherapy on a 5-week schedule admitted on 06/16/2021 with symptomatic anemia.   Clinical Assessment and Goals of Care: I have reviewed medical records including EPIC notes, labs and imaging, received report from RN, assessed the patient.  [name] is sitting up in the ger chair in his room.  He greets me, making and somewhat keeping eye contact.  He is alert and oriented X 3, able to make his basic need known.     We meet at bedside to discuss diagnosis prognosis, GOC, EOL wishes, disposition and options.   I introduced Palliative Medicine as specialized medical care for people living with serious illness. It focuses on providing relief from the symptoms and stress of a serious illness. The goal is to improve quality of life for both the patient and the family.  We discussed a brief life review of the patient.  [name] lives alone, his daughters live in [place] and [place] and son lives in [place].  He has friend Sabino Dick who helps as needed, but tells me that he is independent with IADLs, still driving.    We then focused on their current illness. We talk about his anemia and his appointments with trusted oncologist Dr. Raliegh Ip.  He shares that he is worried because he has missed cancer treatment yesterday and today.  I share that Dr. Raliegh Ip will be a great support for him.  I encouraged him to talk with Dr. Raliegh Ip about GOC/code status and out patient palliative. The natural disease trajectory and expectations at EOL  were discussed.  Advanced directives, concepts specific to code status, artifical feeding and hydration, and rehospitalization were considered and discussed.  [name] tells me that he and his family have discussed and although he would want a natural death, he will accept life support for a limited time.   Palliative Care services outpatient were explained and offered.  At this point, he states he doesn't feel like he needs this service.  I encouraged him to talk with Dr. Raliegh Ip.   Discussed the importance of continued conversation with family and the medical providers regarding overall plan of care and treatment options, ensuring decisions are within the context of the patients values and GOCs. \Questions and concerns were addressed.   The patient was encouraged to call with questions or concerns.  PMT will continue to support holistically.  Conference with attending, bedside nursing staff, patient of care team related to patient condition, needs.   HC POA HCPOA - [name] tells me that his daughter [name] in [place] has his HCPOA paperwork.     SUMMARY OF RECOMMENDATIONS  FULL SCOPE/FULL CODE Follow up with trusted oncology, Dr. Raliegh Ip  Decline out patient palliative Declines HH at this time   Code Status/Advance Care Planning: Full code -[name] tells me that he and his children discussed CODE STATUS recently.  He shares that his preference would be to allow a natural death, but his family wanted him to try attempted resuscitation.  We talked about continued goals of care/CODE STATUS discussions..  Symptom Management:  Per hospitalist, no additional needs at this time  Palliative Prophylaxis:  No special needs at this time  Additional Recommendations (Limitations, Scope, Preferences): Full Scope Treatment  Psycho-social/Spiritual:  Desire for further Chaplaincy support:no Additional Recommendations: Caregiving  Support/Resources  Prognosis:  Unable to determine, based on  outcomes.  Guarded at this point.  6 months or less would not be surprising based on advanced age, MDS that is now progressed to high risk.  Discharge Planning:  Anticipate home without services.  He does not seem interested in outpatient palliative       Primary Diagnoses: Present on Admission:  Symptomatic anemia  Thrombocytopenia (HCC)  Myelodysplastic syndrome (HCC)  CKD (chronic [name] disease), stage IIIB  Chronic respiratory failure with hypoxia /// 3L/min at baseline   I have reviewed the medical record, interviewed the patient and family, and examined the patient. The following aspects are pertinent.  Past Medical History:  Diagnosis Date   Bone cancer ([place])    Chronic [name] insufficiency    COPD (chronic obstructive pulmonary disease) (HCC)    GERD (gastroesophageal reflux disease)    Pulmonary nodule    Social History   Socioeconomic History   Marital status: Widowed    Spouse name: Not on file   Number of children: Not on file   Years of education: Not on file   Highest education level: Not on file  Occupational History   Not on file  Tobacco Use   Smoking status: Former    Years: 15.00    Types: Cigarettes, Cigars    Quit date: 10/01/1990    Years since quitting: 30.7   Smokeless tobacco: Never   Tobacco comments:    Quit x 25-30 years; 2 cigars daily when smoked  Vaping Use   Vaping Use: Never used  Substance and Sexual Activity   Alcohol use: Not Currently    Alcohol/week: 0.0 standard drinks   Drug use: No   Sexual activity: Never    Birth control/protection: None  Other Topics Concern   Not on file  Social History Narrative   Not on file   Social Determinants of Health   Financial Resource Strain: Not on file  Food Insecurity: Not on file  Transportation Needs: Not on file  Physical Activity: Not on file  Stress: Not on file  Social Connections: Not on file   Family History  Problem Relation Age of Onset   [name] disease Mother     [name] disease Sister    Colon cancer Neg Hx    Gastric cancer Neg Hx    Esophageal cancer Neg Hx    Scheduled Meds:  Chlorhexidine Gluconate Cloth  6 each Topical Daily   docusate sodium  100 mg Oral BID   sodium chloride flush  3 mL Intravenous Q12H   Continuous Infusions: PRN Meds:.acetaminophen **OR** acetaminophen, albuterol, bisacodyl, hydrALAZINE, ondansetron **OR** ondansetron (ZOFRAN) IV, oxyCODONE, polyethylene glycol Medications Prior to Admission:  Prior to Admission medications   Medication Sig Start Date End Date Taking? Authorizing Provider  albuterol (VENTOLIN HFA) 108 (  90 Base) MCG/ACT inhaler Inhale 2 puffs into the lungs every 4 (four) hours as needed for wheezing or shortness of breath. 04/04/20  Yes Roxan Hockey, MD   No Known Allergies Review of Systems  Unable to perform ROS: Age   Physical Exam Vitals and nursing note reviewed.  Constitutional:      General: He is not in acute distress.    Appearance: He is ill-appearing.  HENT:     Mouth/Throat:     Mouth: Mucous membranes are moist.  Cardiovascular:     Rate and Rhythm: Normal rate.  Pulmonary:     Effort: Pulmonary effort is normal. No respiratory distress.  Abdominal:     General: Abdomen is flat.  Musculoskeletal:     Comments: Thin, appears frail  Skin:    General: Skin is warm and dry.  Neurological:     Mental Status: He is alert and oriented to person, place, and time.  Psychiatric:        Mood and Affect: Mood normal.        Behavior: Behavior normal.    Vital Signs: BP (!) 95/48 (BP Location: Left Arm)    Pulse 77    Temp 98 F (36.7 C)    Resp 17    Ht 6' (1.829 m)    Wt 54 kg    SpO2 94%    BMI 16.15 kg/m  Pain Scale: 0-10   Pain Score: 0-No pain   SpO2: SpO2: 94 % O2 Device:SpO2: 94 % O2 Flow Rate: .O2 Flow Rate (L/min): 4 L/min  IO: Intake/output summary:  Intake/Output Summary (Last 24 hours) at 06/17/2021 1301 Last data filed at 06/17/2021 1100 Gross per 24 hour   Intake 1488.52 ml  Output 800 ml  Net 688.52 ml    LBM: Last BM Date: 06/15/21 Baseline Weight: Weight: 54 kg Most recent weight: Weight: 54 kg     Palliative Assessment/Data:   Flowsheet Rows    Flowsheet Row Most Recent Value  Intake Tab   Referral Department Hospitalist  Unit at Time of Referral Cardiac/Telemetry Unit  Palliative Care Primary Diagnosis Other (Comment)  Date Notified 06/16/21  Palliative Care Type New Palliative care  Reason for referral Clarify Goals of Care  Date of Admission 06/16/21  Date first seen by Palliative Care 06/17/21  # of days Palliative referral response time 1 Day(s)  # of days IP prior to Palliative referral 0  Clinical Assessment   Palliative Performance Scale Score 50%  Pain Max last 24 hours Not able to report  Pain Min Last 24 hours Not able to report  Dyspnea Max Last 24 Hours Not able to report  Dyspnea Min Last 24 hours Not able to report  Psychosocial & Spiritual Assessment   Palliative Care Outcomes        Time In: 0905 Time Out: 1000 Time Total: 55 minutes  Greater than 50%  of this time was spent counseling and coordinating care related to the above assessment and plan.  Signed by: Drue Novel, NP   Please contact Palliative Medicine Team phone at [phone] for questions and concerns.  For individual provider: See Shea Evans

## 2021-06-17 NOTE — Hospital Course (Signed)
[name] is a 88 y.o. male with medical history significant of MDS; COPD; and CKD presenting with weakness, and hypotension.  He reports that he got dressed this AM to go to the cancer center and he was dizzy.  He sat back down and did it again and was still dizzy.  He passed out when EMS arrived, could tell it was coming-- this happens frequently.   He has bone cancer x 3 years and today he was supposed to have his chemo increased.  He goes 5 times a week for treatments.  He periodically needs blood transfusions, usually every 2 weeks.  This is how he usually feels when he needs blood.   He was last seen by Dr. Delton Coombes on 1/16 and by bone marrow biopsy in 07/2020 he had progressed to the high risk category.  He routinely goes for treatment with azacitidine 5x/week and there has been discussion about increasing the dose.  Dr. Delton Coombes added to treatment team.

## 2021-06-17 NOTE — Plan of Care (Signed)
°  Problem: Acute Rehab PT Goals(only PT should resolve) Goal: Patient Will Transfer Sit To/From Stand Outcome: Progressing Flowsheets (Taken 06/17/2021 1255) Patient will transfer sit to/from stand: with modified independence Goal: Pt Will Ambulate Outcome: Progressing Flowsheets (Taken 06/17/2021 1255) Pt will Ambulate:  50 feet  with supervision  with rolling walker Goal: Pt/caregiver will Perform Home Exercise Program Outcome: Progressing Flowsheets (Taken 06/17/2021 1255) Pt/caregiver will Perform Home Exercise Program:  For increased strengthening  For improved balance  Independently   Tori Little Winton PT, DPT 06/17/21, 12:55 PM

## 2021-06-17 NOTE — Progress Notes (Signed)
Discharge instructions provided to patient. Patient verbalized understanding of discharge instructions. No further questions. Noted patient to be on oxygen and instructed patient to have ride bring home O2 here. Patient stated that wasn't necessary and he would be fine until he made it home. Educated on importance of patient having oxygen on. However, patient insistent on needing home o2 while he waits for his ride to come get him.

## 2021-06-17 NOTE — Progress Notes (Signed)
Progress Note   Patient: [name] WPV:[id number] DOB: [birth year] DOA: 06/16/2021     0 DOS: the patient was seen and examined on 06/17/2021   Brief hospital course: MINARD MILLIRONS is a 88 y.o. male with medical history significant of MDS; COPD; and CKD presenting with weakness, and hypotension.  He reports that he got dressed this AM to go to the cancer center and he was dizzy.  He sat back down and did it again and was still dizzy.  He passed out when EMS arrived, could tell it was coming-- this happens frequently.   He has bone cancer x 3 years and today he was supposed to have his chemo increased.  He goes 5 times a week for treatments.  He periodically needs blood transfusions, usually every 2 weeks.  This is how he usually feels when he needs blood.   He was last seen by Dr. Delton Coombes on 1/16 and by bone marrow biopsy in 07/2020 he had progressed to the high risk category.  He routinely goes for treatment with azacitidine 5x/week and there has been discussion about increasing the dose.  Dr. Delton Coombes added to treatment team.     Assessment and Plan: * Symptomatic anemia- (present on admission) -Patient presenting with near syncope/syncope in the setting of known recurrent symptomatic anemia -In review of labs, his Hgb has been downtrending over the last month -Hgb 7- s/p transfusion and now 8 -PT/OT eval as patient lives alone  Myelodysplastic syndrome ([place])- (present on admission) -Patient with 3 years of MDS -He reports receiving chemotherapy 5 days a week and is due for increased dose -He reports that the chemotherapy does not appear to have improved his symptoms -Dr. Delton Coombes has been added to the treatment team  Pre-syncope Has frequent episodes- orthostatics negative  Chronic respiratory failure with hypoxia /// 3L/min at baseline- (present on admission) -On 3-4L [place] O2 currently -His ONLY listed home med is prn Albuterol, will continue  CKD (chronic kidney  disease), stage IIIB- (present on admission) -Advanced CKD, appears to be stable   Thrombocytopenia ([place])- (present on admission) -Likely due to chemotherapy -Appears to be stable at this time -No intervention for now   GOC: patient met with palliative care: patient has had conversations with family-- family want him to remain a full code.  Outpatient palliative care referral    Subjective: frequently has pre-syncopal episodes-- can recognize when they are coming on and pull over if driving or sit down if home   Physical Exam: Vitals:   06/16/21 2350 06/17/21 0149 06/17/21 0327 06/17/21 0515  BP: (!) 96/52 (!) 108/57 (!) 111/58 (!) 95/48  Pulse: 78 74 74 77  Resp: '18 18 17 17  ' Temp: 98.5 F (36.9 C) 98.4 F (36.9 C) 98.5 F (36.9 C) 98 F (36.7 C)  TempSrc: Oral Oral Oral   SpO2: (!) 4% 98% 97% 94%  Weight:      Height:        General: Appearance:    Thin male in no acute distress     Lungs:      respirations unlabored  Heart:    Normal heart rate.   MS:   All extremities are intact.    Neurologic:   Awake, alert, oriented x 3     Data Reviewed:  Cr stable around 2 Hgb increased to 8   Disposition: Status is: Observation The patient remains OBS appropriate and will d/c before 2 midnights.  Planned Discharge Destination: Home  Time spent: 35 minutes  Author: Geradine Girt, DO 06/17/2021 12:02 PM  For on call review [email or web address].

## 2021-06-17 NOTE — Progress Notes (Signed)
Oncology Discharge Planning Note  [place] [facility] Address: [address], [place] [address] Hours of Operation:  8am - 5pm, Monday - Friday  Clinic Contact Information:  [phone]  Oncology Care Team: Medical Oncologist:  Derek Jack  Patient Details: Name:  [name], [name] MRN:   [id number] DOB:   [birth year] Reason for Current Admission: Symptomatic anemia  Discharge Planning Narrative:  Discharge follow-up appointments for oncology are current and available on the AVS and MyChart.   Upon discharge from the hospital, hematology/oncology's post discharge plan of care for the outpatient setting is: 06/23/21 for  Lab/Port Flush, Apt with Dr Delton Coombes and possible transfusion.  Humza Tallerico will be called within two business days after discharge to review hematology/oncology's plan of care for full understanding.    Outpatient Oncology Specific Care Only: Oncology appointment transportation needs addressed?:  not applicable Oncology medication management for symptom management addressed?:  not applicable Chemo Alert Card reviewed?:  not applicable Immunotherapy Alert Card reviewed?:  not applicable

## 2021-06-17 NOTE — Evaluation (Signed)
Physical Therapy Evaluation Patient Details Name: [name] MRN: [id number] DOB: May 26, 1929 Today's Date: 06/17/2021  History of Present Illness  DRAVIN LANCE is a 88 y.o. male presents with weakness. Pt admitted with symptomatic anemia, s/p 1 unit PRBCs. PMH: COPD, CKD, Myelodysplastic syndrome   Clinical Impression  Pt admitted with above diagnosis. Pt from home alone, independent, reports friends locally who can assist if needed, has weekly cleaning lady, using RW in the home and electric scooter in the community, drives, and on 3L O2 all the time. Pt currently mobilizing with min guard, limited with steps at bedside, 3/4 dyspnea with mobility requiring seated rest breaks to recover, SpO2 >90% on 4L. Pt reports he has been mobilizing limited distances for ~3 months, denies falls, doesn't think he needs HHPT but open to the recommendation. Pt tolerates remaining up in the chair at EOS with call bell in lap. Pt currently with functional limitations due to the deficits listed below (see PT Problem List). Pt will benefit from skilled PT to increase their independence and safety with mobility to allow discharge to the venue listed below.          Recommendations for follow up therapy are one component of a multi-disciplinary discharge planning process, led by the attending physician.  Recommendations may be updated based on patient status, additional functional criteria and insurance authorization.  Follow Up Recommendations Home health PT    Assistance Recommended at Discharge Intermittent Supervision/Assistance  Patient can return home with the following  Assistance with cooking/housework;Assist for transportation    Equipment Recommendations None recommended by PT  Recommendations for Other Services       Functional Status Assessment Patient has had a recent decline in their functional status and demonstrates the ability to make significant improvements in function in a  reasonable and predictable amount of time.     Precautions / Restrictions Precautions Precautions: Fall Precaution Comments: monitor O2 Restrictions Weight Bearing Restrictions: No      Mobility  Bed Mobility Overal bed mobility: Modified Independent  General bed mobility comments: slow, labored movement    Transfers Overall transfer level: Needs assistance Equipment used: None Transfers: Sit to/from Stand Sit to Stand: Min guard  General transfer comment: pt using rocking momentum with BUE assist to power to stand from EOB and low seated recliner, initial attempt to rise from low recliner requiring increased effort and time    Ambulation/Gait Ambulation/Gait assistance: Min guard Gait Distance (Feet): 5 Feet Assistive device: None Gait Pattern/deviations: Step-to pattern, Decreased stride length, Trunk flexed Gait velocity: decreased  General Gait Details: pt takes small steps at bedside, reaching for furniture to steady self despite education on RW, maintains trunk flexed, dyspnea 3/4 on 4L with SpO2 >90%, limited by fatigue requiring seated rest break  Stairs            Wheelchair Mobility    Modified Rankin (Stroke Patients Only)       Balance Overall balance assessment: Needs assistance Sitting-balance support: Feet supported Sitting balance-Leahy Scale: Good Sitting balance - Comments: seated EOB, kyphotic sitting posture without LOB while donning shoes   Standing balance support: During functional activity, Single extremity supported Standing balance-Leahy Scale: Poor Standing balance comment: reaching for furniture to steady self       Pertinent Vitals/Pain Pain Assessment Pain Assessment: No/denies pain    Home Living Family/patient expects to be discharged to:: Private residence Living Arrangements: Alone   Type of Home: Mobile home Home Access: Level entry  Home Layout: One level Home Equipment: Conservation officer, nature (2 wheels);Cane - single  point;Shower seat - built in;Electric scooter;Other (comment) (oxygen 3L)      Prior Function Prior Level of Function : Independent/Modified Independent;Driving  Mobility Comments: pt reports using RW in the home, electric scooter in the community, denies falls ADLs Comments: pt reports cooking simple meals, ind with self care, has housekeeper for household chores     Hand Dominance        Extremity/Trunk Assessment   Upper Extremity Assessment Upper Extremity Assessment: Overall WFL for tasks assessed    Lower Extremity Assessment Lower Extremity Assessment: Generalized weakness (AROM WNL, strength 3+/5 throughout, denies numbness/tingling)    Cervical / Trunk Assessment Cervical / Trunk Assessment: Kyphotic  Communication   Communication: No difficulties  Cognition Arousal/Alertness: Awake/alert Behavior During Therapy: WFL for tasks assessed/performed Overall Cognitive Status: Within Functional Limits for tasks assessed     General Comments      Exercises     Assessment/Plan    PT Assessment Patient needs continued PT services  PT Problem List Decreased strength;Decreased activity tolerance;Decreased balance;Decreased knowledge of use of DME;Cardiopulmonary status limiting activity       PT Treatment Interventions DME instruction;Gait training;Functional mobility training;Therapeutic activities;Therapeutic exercise;Balance training;Patient/family education    PT Goals (Current goals can be found in the Care Plan section)  Acute Rehab PT Goals Patient Stated Goal: "I need to go home today" PT Goal Formulation: With patient Time For Goal Achievement: 07/01/21 Potential to Achieve Goals: Good    Frequency Min 3X/week     Co-evaluation               AM-PAC PT "6 Clicks" Mobility  Outcome Measure Help needed turning from your back to your side while in a flat bed without using bedrails?: None Help needed moving from lying on your back to sitting on the  side of a flat bed without using bedrails?: None Help needed moving to and from a bed to a chair (including a wheelchair)?: A Little Help needed standing up from a chair using your arms (e.g., wheelchair or bedside chair)?: A Little Help needed to walk in hospital room?: A Little Help needed climbing 3-5 steps with a railing? : A Lot 6 Click Score: 19    End of Session Equipment Utilized During Treatment: Gait belt;Oxygen Activity Tolerance: Patient tolerated treatment well;Patient limited by fatigue Patient left: in chair;with call bell/phone within reach Nurse Communication: Mobility status;Other (comment) (SpO2 and dyspnea) PT Visit Diagnosis: Other abnormalities of gait and mobility (R26.89);Muscle weakness (generalized) (M62.81);Unsteadiness on feet (R26.81)    Time: 6004-5997 (out of room 13 minutes) PT Time Calculation (min) (ACUTE ONLY): 33 min   Charges:   PT Evaluation $PT Eval Moderate Complexity: 1 Mod           Tori Jese Comella PT, DPT 06/17/21, 12:50 PM

## 2021-06-17 NOTE — Discharge Instructions (Signed)
Would avoid driving until pre-syncopal episode free

## 2021-06-18 ENCOUNTER — Ambulatory Visit (HOSPITAL_COMMUNITY): Payer: Medicare Other

## 2021-06-18 ENCOUNTER — Encounter (HOSPITAL_COMMUNITY): Payer: Self-pay | Admitting: *Deleted

## 2021-06-18 ENCOUNTER — Encounter (HOSPITAL_COMMUNITY): Payer: Self-pay | Admitting: Hematology

## 2021-06-18 NOTE — Progress Notes (Unsigned)
Dejon Lukas was contacted by telephone to verify understanding of discharge instructions status post their most recent discharge from the hospital on the date: 06/16/21.  Inpatient discharge AVS was re-reviewed with patient, along with cancer center appointments.  Verification of understanding for oncology specific follow-up was validated using the Teach Back method.  States he is doing much better.  Denied needing any assistance from [facility] at this time.  Transportation to appointments were confirmed for the patient as being self/caregiver.  Huie Caudills questions were addressed to their satisfaction upon completion of this post discharge follow-up call for outpatient oncology.

## 2021-06-19 ENCOUNTER — Ambulatory Visit (HOSPITAL_COMMUNITY): Payer: Medicare Other

## 2021-06-20 ENCOUNTER — Ambulatory Visit (HOSPITAL_COMMUNITY): Payer: Medicare Other

## 2021-06-21 LAB — CULTURE, BLOOD (ROUTINE X 2)
Culture: NO GROWTH
Culture: NO GROWTH
Special Requests: ADEQUATE

## 2021-06-23 ENCOUNTER — Encounter (HOSPITAL_COMMUNITY): Payer: Medicare Other

## 2021-06-23 ENCOUNTER — Inpatient Hospital Stay (HOSPITAL_COMMUNITY): Payer: Medicare Other

## 2021-06-23 ENCOUNTER — Other Ambulatory Visit: Payer: Self-pay

## 2021-06-23 ENCOUNTER — Inpatient Hospital Stay (HOSPITAL_COMMUNITY): Payer: Medicare Other | Admitting: Dietician

## 2021-06-23 ENCOUNTER — Other Ambulatory Visit (HOSPITAL_COMMUNITY): Payer: Medicare Other

## 2021-06-23 ENCOUNTER — Inpatient Hospital Stay (HOSPITAL_COMMUNITY): Payer: Medicare Other | Attending: Hematology

## 2021-06-23 ENCOUNTER — Inpatient Hospital Stay (HOSPITAL_BASED_OUTPATIENT_CLINIC_OR_DEPARTMENT_OTHER): Payer: Medicare Other | Admitting: Hematology

## 2021-06-23 VITALS — BP 90/70 | HR 59 | Temp 94.7°F | Resp 20 | Ht 72.44 in | Wt 121.0 lb

## 2021-06-23 VITALS — BP 99/55 | HR 81 | Resp 20

## 2021-06-23 DIAGNOSIS — D61818 Other pancytopenia: Secondary | ICD-10-CM | POA: Insufficient documentation

## 2021-06-23 DIAGNOSIS — D649 Anemia, unspecified: Secondary | ICD-10-CM

## 2021-06-23 DIAGNOSIS — Z79899 Other long term (current) drug therapy: Secondary | ICD-10-CM | POA: Diagnosis not present

## 2021-06-23 DIAGNOSIS — E876 Hypokalemia: Secondary | ICD-10-CM | POA: Diagnosis not present

## 2021-06-23 DIAGNOSIS — D469 Myelodysplastic syndrome, unspecified: Secondary | ICD-10-CM | POA: Diagnosis not present

## 2021-06-23 DIAGNOSIS — I959 Hypotension, unspecified: Secondary | ICD-10-CM | POA: Diagnosis not present

## 2021-06-23 LAB — CBC
HCT: 26.2 % — ABNORMAL LOW (ref 39.0–52.0)
Hemoglobin: 8.3 g/dL — ABNORMAL LOW (ref 13.0–17.0)
MCH: 26.1 pg (ref 26.0–34.0)
MCHC: 31.7 g/dL (ref 30.0–36.0)
MCV: 82.4 fL (ref 80.0–100.0)
Platelets: 42 10*3/uL — ABNORMAL LOW (ref 150–400)
RBC: 3.18 MIL/uL — ABNORMAL LOW (ref 4.22–5.81)
RDW: 18.6 % — ABNORMAL HIGH (ref 11.5–15.5)
WBC: 6.9 10*3/uL (ref 4.0–10.5)
nRBC: 1 % — ABNORMAL HIGH (ref 0.0–0.2)

## 2021-06-23 LAB — PREPARE RBC (CROSSMATCH)

## 2021-06-23 MED ORDER — ACETAMINOPHEN 325 MG PO TABS: 650.0000 mg | ORAL_TABLET | Freq: Once | ORAL | Status: DC

## 2021-06-23 MED ORDER — HEPARIN SOD (PORK) LOCK FLUSH 100 UNIT/ML IV SOLN
500.0000 [IU] | Freq: Every day | INTRAVENOUS | Status: AC | PRN
  Administered 2021-06-23: 500 [IU]

## 2021-06-23 MED ORDER — SODIUM CHLORIDE 0.9 % IV SOLN: Freq: Once | INTRAVENOUS | Status: AC

## 2021-06-23 MED ORDER — SODIUM CHLORIDE 0.9% IV SOLUTION: 250.0000 mL | Freq: Once | INTRAVENOUS | Status: DC

## 2021-06-23 MED ORDER — DIPHENHYDRAMINE HCL 25 MG PO CAPS: 25.0000 mg | ORAL_CAPSULE | Freq: Once | ORAL | Status: DC

## 2021-06-23 MED ORDER — SODIUM CHLORIDE 0.9% FLUSH
10.0000 mL | INTRAVENOUS | Status: AC | PRN
  Administered 2021-06-23: 10 mL

## 2021-06-23 MED ORDER — OCTREOTIDE ACETATE 30 MG IM KIT
PACK | INTRAMUSCULAR | Status: AC
  Filled 2021-06-23: qty 1

## 2021-06-23 MED ORDER — MIDODRINE HCL 2.5 MG PO TABS: 2.5000 mg | ORAL_TABLET | Freq: Three times a day (TID) | ORAL | 3 refills | Status: DC

## 2021-06-23 NOTE — Progress Notes (Signed)
[place], [place] [address]   CLINIC:  Medical Oncology/Hematology  PCP:  Sharilyn Sites, [place] [address] / [place] [address] [phone]   REASON FOR VISIT:  Follow-up for MDS  PRIOR THERAPY: Aranesp, Retacrit, intermittent PRBC transfusions  NGS Results: not done  CURRENT THERAPY: Azacitidine (initiated on 09/16/2020)  BRIEF ONCOLOGIC HISTORY:  Oncology History  Myelodysplastic syndrome ([facility])  09/16/2020 -  Chemotherapy   Patient is on Treatment Plan : MYELODYSPLASIA  Azacitidine IV D1-7 q28d       CANCER STAGING:  Cancer Staging  No matching staging information was found for the patient.  INTERVAL HISTORY:  [name], a 88 y.o. male, returns for routine follow-up of his MDS. Zamire was last seen on 06/02/2021.   Today he reports feeling well. He reports light-headedness and dizziness upon standing. His appetite and eating are poor. He drives himself to his own appointments. He denies diarrhea. He reports consuming plenty of salt in his diet. He reports SOB and sharp right sided CP upon deep inspiration starting 2 months ago.   REVIEW OF SYSTEMS:  Review of Systems  Constitutional:  Positive for appetite change and fatigue.  Respiratory:  Positive for shortness of breath.   Cardiovascular:  Positive for chest pain (sharp, R side).  Gastrointestinal:  Negative for diarrhea.  Neurological:  Positive for dizziness and light-headedness.  All other systems reviewed and are negative.  PAST MEDICAL/SURGICAL HISTORY:  Past Medical History:  Diagnosis Date   Bone cancer ([facility])    Chronic kidney insufficiency    COPD (chronic obstructive pulmonary disease) (H[facility])    GERD (gastroesophageal reflux disease)    Pulmonary nodule    Past Surgical History:  Procedure Laterality Date   BACK SURGERY     CATARACT EXTRACTION W/PHACO Right 03/09/2016   Procedure: CATARACT EXTRACTION PHACO AND INTRAOCULAR LENS PLACEMENT  RIGHT EYE CDE=8.58;  Surgeon: Tonny Branch, MD;  Location: [facility];  Service: Ophthalmology;  Laterality: Right;  right   CATARACT EXTRACTION W/PHACO Left 04/13/2016   Procedure: CATARACT EXTRACTION PHACO AND INTRAOCULAR LENS PLACEMENT (IOC);  Surgeon: Tonny Branch, MD;  Location: [facility];  Service: Ophthalmology;  Laterality: Left;  CDE: 8.08   KYPHOPLASTY Bilateral 06/09/2019   Procedure: T11 KYPHOPLASTY;  Surgeon: Consuella Lose, MD;  Location: [facility];  Service: Neurosurgery;  Laterality: Bilateral;   None to Date  10/01/15   PORTACATH PLACEMENT Left 10/11/2020   Procedure: INSERTION PORT-A-CATH (attached catheter in left internal jugular);  Surgeon: Virl Cagey, MD;  Location: [facility];  Service: General;  Laterality: Left;    SOCIAL HISTORY:  Social History   Socioeconomic History   Marital status: Widowed    Spouse name: Not on file   Number of children: Not on file   Years of education: Not on file   Highest education level: Not on file  Occupational History   Not on file  Tobacco Use   Smoking status: Former    Years: 15.00    Types: Cigarettes, Cigars    Quit date: 10/01/1990    Years since quitting: 30.7   Smokeless tobacco: Never   Tobacco comments:    Quit x 25-30 years; 2 cigars daily when smoked  Vaping Use   Vaping Use: Never used  Substance and Sexual Activity   Alcohol use: Not Currently    Alcohol/week: 0.0 standard drinks   Drug use: No   Sexual activity: Never    Birth  control/protection: None  Other Topics Concern   Not on file  Social History Narrative   Not on file   Social Determinants of Health   Financial Resource Strain: Not on file  Food Insecurity: Not on file  Transportation Needs: Not on file  Physical Activity: Not on file  Stress: Not on file  Social Connections: Not on file  Intimate Partner Violence: Not on file    FAMILY HISTORY:  Family History  Problem Relation Age of Onset   Kidney disease Mother    Kidney disease Sister     Colon cancer Neg Hx    Gastric cancer Neg Hx    Esophageal cancer Neg Hx     CURRENT MEDICATIONS:  Current Outpatient Medications  Medication Sig Dispense Refill   albuterol (VENTOLIN HFA) 108 (90 Base) MCG/ACT inhaler Inhale 2 puffs into the lungs every 4 (four) hours as needed for wheezing or shortness of breath. 18 g 2   midodrine (PROAMATINE) 2.5 MG tablet Take 1 tablet (2.5 mg total) by mouth 3 (three) times daily with meals. 270 each 3   No current facility-administered medications for this visit.   Facility-Administered Medications Ordered in Other Visits  Medication Dose Route Frequency Provider Last Rate Last Admin   0.9 %  sodium chloride infusion   Intravenous Continuous Derek Jack, MD   Stopped at 01/06/21 1018   octreotide (SANDOSTATIN LAR) 30 MG IM injection            sodium chloride flush (NS) 0.9 % injection 10 mL  10 mL Intracatheter PRN Derek Jack, MD   10 mL at 01/06/21 1215   sodium chloride flush (NS) 0.9 % injection 10 mL  10 mL Intravenous PRN Derek Jack, MD   10 mL at 02/17/21 0902   sodium chloride flush (NS) 0.9 % injection 10 mL  10 mL Intravenous PRN Derek Jack, MD   10 mL at 03/10/21 0936    ALLERGIES:  No Known Allergies  PHYSICAL EXAM:  Performance status (ECOG): 2 - Symptomatic, <50% confined to bed  Vitals:   06/23/21 0815  BP: 90/70  Pulse: (!) 59  Resp: 20  Temp: (!) 94.7 F (34.8 C)  SpO2: 98%   Wt Readings from Last 3 Encounters:  06/23/21 121 lb (54.9 kg)  06/16/21 119 lb 0.8 oz (54 kg)  06/09/21 117 lb 3.2 oz (53.2 kg)   Physical Exam Vitals reviewed.  Constitutional:      Appearance: Normal appearance.     Interventions: Nasal cannula in place.  Cardiovascular:     Rate and Rhythm: Normal rate and regular rhythm.     Pulses: Normal pulses.     Heart sounds: Normal heart sounds.  Pulmonary:     Effort: Pulmonary effort is normal.     Breath sounds: Normal breath sounds.  Chest:      Chest wall: No tenderness.  Breasts:    Right: No tenderness.     Left: No tenderness.  Musculoskeletal:     Right lower leg: No edema.     Left lower leg: No edema.  Neurological:     General: No focal deficit present.     Mental Status: He is alert and oriented to person, place, and time.  Psychiatric:        Mood and Affect: Mood normal.        Behavior: Behavior normal.     LABORATORY DATA:  I have reviewed the labs as listed.  CBC Latest Ref Rng &  Units 06/23/2021 06/17/2021 06/16/2021  WBC 4.0 - 10.5 K/uL 6.9 5.3 5.0  Hemoglobin 13.0 - 17.0 g/dL 8.3(L) 8.0(L) 7.0(L)  Hematocrit 39.0 - 52.0 % 26.2(L) 24.8(L) 22.9(L)  Platelets 150 - 400 K/uL 42(L) 31(L) 31(L)   CMP Latest Ref Rng & Units 06/17/2021 06/16/2021 06/09/2021  Glucose 70 - 99 mg/dL 97 90 113(H)  BUN 8 - 23 mg/dL 37(H) 38(H) 33(H)  Creatinine 0.61 - 1.24 mg/dL 2.04(H) 1.96(H) 2.22(H)  Sodium 135 - 145 mmol/L 135 133(L) 133(L)  Potassium 3.5 - 5.1 mmol/L 3.6 3.4(L) 3.3(L)  Chloride 98 - 111 mmol/L 109 109 103  CO2 22 - 32 mmol/L 21(L) 21(L) 20(L)  Calcium 8.9 - 10.3 mg/dL 8.8(L) 9.1 9.5  Total Protein 6.5 - 8.1 g/dL - 6.1(L) 6.8  Total Bilirubin 0.3 - 1.2 mg/dL - 0.8 0.9  Alkaline Phos 38 - 126 U/L - 59 70  AST 15 - 41 U/L - 17 21  ALT 0 - 44 U/L - 11 12    DIAGNOSTIC IMAGING:  I have independently reviewed the scans and discussed with the patient. CT BIOPSY  Result Date: 05/26/2021 INDICATION: MDS EXAM: CT BONE MARROW BIOPSY AND ASPIRATION; CT BIOPSY MEDICATIONS: None. ANESTHESIA/SEDATION: Moderate (conscious) sedation was employed during this procedure. A total of Versed 0.5 mg and Fentanyl 25 mcg was administered intravenously. Moderate Sedation Time: 10 minutes. The patient's level of consciousness and vital signs were monitored continuously by radiology nursing throughout the procedure under my direct supervision. FLUOROSCOPY TIME:  N/a COMPLICATIONS: None immediate. PROCEDURE: Informed written consent was  obtained from the patient after a thorough discussion of the procedural risks, benefits and alternatives. All questions were addressed. Maximal Sterile Barrier Technique was utilized including caps, mask, sterile gowns, sterile gloves, sterile drape, hand hygiene and skin antiseptic. A timeout was performed prior to the initiation of the procedure. The patient was placed prone on the CT exam table. Limited CT of the pelvis was performed for planning purposes. Skin entry site was marked, and the overlying skin was prepped and draped in the standard sterile fashion. Local analgesia was obtained with 1% lidocaine. Using CT guidance, an 11 gauge needle was advanced just deep to the cortex of the right posterior ilium. Subsequently, bone marrow aspiration and core biopsy were performed. Specimens were submitted to lab/pathology for handling. Hemostasis was achieved with manual pressure, and a clean dressing was placed. The patient tolerated the procedure well without immediate complication. IMPRESSION: Successful CT-guided bone marrow aspiration and core biopsy of the right posterior ilium. Electronically Signed   By: Albin Felling M.D.   On: 05/26/2021 14:04   DG Chest Port 1 View  Result Date: 06/16/2021 CLINICAL DATA:  An 88 year old male presents with question of sepsis. EXAM: PORTABLE CHEST 1 VIEW COMPARISON:  February 15, 2021 radiographs and CT imaging from that date. FINDINGS: EKG leads project over the chest. LEFT-sided Port-A-Cath terminates at the caval to atrial junction. Cardiomediastinal contours and hilar structures are stable elongated due to hyperinflation that has been present over a series of prior studies. Interstitial thickening which was present previously is is perhaps slightly worse compared to the study of February 15, 2021. No lobar consolidative process.  No visible pneumothorax. Signs of cement augmentation and thoracic lumbar vertebral bodies not well evaluated. IMPRESSION: Chronic  interstitial thickening with some worsening may reflect worsening of background interstitial lung disease. The possibility of superimposed infection is not excluded. No signs of lobar consolidation or visible evidence of pleural effusion. Electronically Signed  By: Zetta Bills M.D.   On: 06/16/2021 09:05   CT BONE MARROW BIOPSY & ASPIRATION  Result Date: 05/26/2021 INDICATION: MDS EXAM: CT BONE MARROW BIOPSY AND ASPIRATION; CT BIOPSY MEDICATIONS: None. ANESTHESIA/SEDATION: Moderate (conscious) sedation was employed during this procedure. A total of Versed 0.5 mg and Fentanyl 25 mcg was administered intravenously. Moderate Sedation Time: 10 minutes. The patient's level of consciousness and vital signs were monitored continuously by radiology nursing throughout the procedure under my direct supervision. FLUOROSCOPY TIME:  N/a COMPLICATIONS: None immediate. PROCEDURE: Informed written consent was obtained from the patient after a thorough discussion of the procedural risks, benefits and alternatives. All questions were addressed. Maximal Sterile Barrier Technique was utilized including caps, mask, sterile gowns, sterile gloves, sterile drape, hand hygiene and skin antiseptic. A timeout was performed prior to the initiation of the procedure. The patient was placed prone on the CT exam table. Limited CT of the pelvis was performed for planning purposes. Skin entry site was marked, and the overlying skin was prepped and draped in the standard sterile fashion. Local analgesia was obtained with 1% lidocaine. Using CT guidance, an 11 gauge needle was advanced just deep to the cortex of the right posterior ilium. Subsequently, bone marrow aspiration and core biopsy were performed. Specimens were submitted to lab/pathology for handling. Hemostasis was achieved with manual pressure, and a clean dressing was placed. The patient tolerated the procedure well without immediate complication. IMPRESSION: Successful CT-guided  bone marrow aspiration and core biopsy of the right posterior ilium. Electronically Signed   By: Albin Felling M.D.   On: 05/26/2021 14:04     ASSESSMENT:  1.  Myelodysplastic syndrome with pancytopenia: - Bone marrow biopsy on 12/08/2016 in [place], [place] showed normocellular marrow with trilineage hematopoiesis with erythroid predominance and dysmegakaryopoiesis.  Blasts were not increased.  Cytogenetics showed 46, XY,del(12)(p.2p13)[8]/46,XY[12].  FISH was positive for 12p-. - Bone marrow biopsy and testing in 2018 was consistent with myelodysplastic syndrome with single lineage dysplasia (MDS-SLD),IPSS-R score of 2, consistent with low risk category -  Repeat bone marrow biopsy performed on 08/13/2019 by Dr. Delton Coombes with the following results: Hypercellular bone marrow with dyspoietic changes Bone marrow aspirate with 5% blastic cells of granulocytic precursors Flow pathology with 3% blasts Peripheral blood with 2% circulating blasts Cytogenetic analysis show del(12p) and 13 of the examined cells with remaining 7 cells showing normal chromosome Karyotype: 46,XY,del(12)(p13p11.2)[13]/46,XY[7] FISH analysis was normal - Based on most recent bone marrow biopsy (08/04/2020), patient's IPSS-R is 5, showing that he has progressed to the high risk category - He has required multiple blood transfusions over the past 6 months, most recently on 08/09/2020 (Hgb 7.1) - No improvement on monthly Aranesp or on weekly Retacrit injections (started on 07/10/2020 - discontinued 09/04/2020) - Stool occult blood cards x3 were negative -Azacitidine (50 mg/m2) x5 days started on 09/16/2020.  Seventh cycle completed on 04/21/2021. - Bone marrow biopsy on 05/26/2021-hypercellular marrow with aspirate showing 6% blastic cells.  Previous bone marrow showed 5% blastic cells. - MDS FISH panel was normal. - Cytogenetics showed 46, XY, del (12) in 10 chromosomes and 10 normal chromosomes which is similar to previous  marrow.  PLAN:  1.  Myelodysplastic syndrome (high risk) with pancytopenia: - He had recent hospitalization with near syncopal episode. - Today's blood pressure was 60/40, which improved to 90/60 when he was lying flat. - He denies any dizziness or syncopal episodes since his discharge from the hospital. - We talked him about discontinuing azacitidine at  this time.  We will still continue lab monitoring and transfusion as needed. - His hemoglobin today is 8.3.  However because of hypotension, I have recommended 1 unit of PRBC.  He will also receive 1 L of normal saline. - We will start him on midodrine 2.5 mg 3 times daily. - RTC 2 weeks for follow-up.  2.  CKD: - Baseline creatinine around 2.0 and stable.  3.  Bladder problems: - Continue Rapaflo which is helping his symptoms.  4.  Weight loss: - He has tried Marinol and Megace in the past which did not help. - I have recommended nutritional supplements.  5.  MDS/treatment related fatigue: - We started him on Ritalin 5 mg in the mornings which did not help.   Orders placed this encounter:  No orders of the defined types were placed in this encounter.    Derek Jack, MD [facility] [phone]   I, Thana Ates, am acting as a scribe for Dr. Derek Jack.  I, Derek Jack MD, have reviewed the above documentation for accuracy and completeness, and I agree with the above.

## 2021-06-23 NOTE — Patient Instructions (Signed)
[facility]  Discharge Instructions: Thank you for choosing [facility] to provide your oncology and hematology care.  If you have a lab appointment with the [facility], please come in thru the Main Entrance and check in at the main information desk.  Wear comfortable clothing and clothing appropriate for easy access to any Portacath or PICC line.   We strive to give you quality time with your provider. You may need to reschedule your appointment if you arrive late (15 or more minutes).  Arriving late affects you and other patients whose appointments are after yours.  Also, if you miss three or more appointments without notifying the office, you may be dismissed from the clinic at the providers discretion.      For prescription refill requests, have your pharmacy contact our office and allow 72 hours for refills to be completed.    Today you received the following : 1 Liter of Normal Saline.      To help prevent nausea and vomiting after your treatment, we encourage you to take your nausea medication as directed.  BELOW ARE SYMPTOMS THAT SHOULD BE REPORTED IMMEDIATELY: *FEVER GREATER THAN 100.4 F (38 C) OR HIGHER *CHILLS OR SWEATING *NAUSEA AND VOMITING THAT IS NOT CONTROLLED WITH YOUR NAUSEA MEDICATION *UNUSUAL SHORTNESS OF BREATH *UNUSUAL BRUISING OR BLEEDING *URINARY PROBLEMS (pain or burning when urinating, or frequent urination) *BOWEL PROBLEMS (unusual diarrhea, constipation, pain near the anus) TENDERNESS IN MOUTH AND THROAT WITH OR WITHOUT PRESENCE OF ULCERS (sore throat, sores in mouth, or a toothache) UNUSUAL RASH, SWELLING OR PAIN  UNUSUAL VAGINAL DISCHARGE OR ITCHING   Items with * indicate a potential emergency and should be followed up as soon as possible or go to the Emergency Department if any problems should occur.  Please show the CHEMOTHERAPY ALERT CARD or IMMUNOTHERAPY ALERT CARD at check-in to the Emergency Department and triage  nurse.  Should you have questions after your visit or need to cancel or reschedule your appointment, please contact [facility] [phone]  and follow the prompts.  Office hours are 8:00 a.m. to 4:30 p.m. Monday - Friday. Please note that voicemails left after 4:00 p.m. may not be returned until the following business day.  We are closed weekends and major holidays. You have access to a nurse at all times for urgent questions. Please call the main number to the clinic [phone] and follow the prompts.  For any non-urgent questions, you may also contact your provider using MyChart. We now offer e-Visits for anyone 26 and older to request care online for non-urgent symptoms. For details visit [email or web address].   Also download the MyChart app! Go to the app store, search "MyChart", open the app, select [facility], and log in with your MyChart username and password.  Due to Covid, a mask is required upon entering the hospital/clinic. If you do not have a mask, one will be given to you upon arrival. For doctor visits, patients may have 1 support person aged 39 or older with them. For treatment visits, patients cannot have anyone with them due to current Covid guidelines and our immunocompromised population.

## 2021-06-23 NOTE — Progress Notes (Signed)
Nutrition Follow-up:  Patient with myelodysplastic syndrome. He is receiving Azacitidine q6 weeks. Patient recently admitted to hospital 1/30-1/31 for symptomatic anemia. Noted treatment discontinued at this time. He will return for labs q2 weeks followed by blood products as needed. Patient will receive 1 unit of blood and 1 Liter of NS today.   Met with patient in clinic. He reports ongoing weakness and frequent presyncope episodes. Patient reports these episodes have happened for years and he can feel them coming on. Patient reports it begins with tingling in his feet and works its way up. Patient reports he felt dizzy and thought he might pass out on appointment arrival today. Patient currently reclined in chair, he denies lightheadedness/dizziness or tingling in lower extremities. Patient continues to eat 3 small meals. He has not eaten breakfast meal today and politely declines offer of snack or Ensure to drink this morning. Patient has increased intake of Ensure as recommended. He is drinking 3 Ensure Complete daily. Patient reports they are rich in flavor and hoping this RD does not ask him to drink anymore than 3.    Medications: Midodrine  Labs: reviewed   Anthropometrics: Weight 121 lb today increased   1/30 - 119 lb 0.8 oz 1/23 - 117 lb 3.2 oz 1/16 - 121 lb 12.8 oz  1/09 - 123 lb    NUTRITION DIAGNOSIS: Severe malnutrition ongoing   INTERVENTION:  Encouraged high calorie high protein foods to promote weight gain Continue drinking 3 Ensure Complete daily    MONITORING, EVALUATION, GOAL: weight trends, intake    NEXT VISIT: To be scheduled ~4 weeks

## 2021-06-23 NOTE — Patient Instructions (Addendum)
[facility] at [facility] Discharge Instructions   You were seen and examined today by Dr. Delton Coombes.  He reviewed your lab work with you which is normal/stable.   We discussed discontinuing treatments as they are minimally helping and offering you supportive care in the way of blood transfusions as needed.  Your blood pressure is low and has been staying low.  We sent a prescription to your pharmacy to help get your blood pressure up.   Return as scheduled in 2 weeks.  Call us sooner if you feel like you need blood prior today.      Thank you for choosing [facility] at [facility] to provide your oncology and hematology care.  To afford each patient quality time with our provider, please arrive at least 15 minutes before your scheduled appointment time.   If you have a lab appointment with the [facility] please come in thru the Main Entrance and check in at the main information desk.  You need to re-schedule your appointment should you arrive 10 or more minutes late.  We strive to give you quality time with our providers, and arriving late affects you and other patients whose appointments are after yours.  Also, if you no show three or more times for appointments you may be dismissed from the clinic at the providers discretion.     Again, thank you for choosing [facility].  Our hope is that these requests will decrease the amount of time that you wait before being seen by our physicians.       _____________________________________________________________  Should you have questions after your visit to [facility], please contact our office at [phone] and follow the prompts.  Our office hours are 8:00 a.m. and 4:30 p.m. Monday - Friday.  Please note that voicemails left after 4:00 p.m. may not be returned until the following business day.  We are closed weekends and major holidays.  You do have access to a  nurse 24-7, just call the main number to the clinic [phone] and do not press any options, hold on the line and a nurse will answer the phone.    For prescription refill requests, have your pharmacy contact our office and allow 72 hours.    Due to Covid, you will need to wear a mask upon entering the hospital. If you do not have a mask, a mask will be given to you at the Main Entrance upon arrival. For doctor visits, patients may have 1 support person age 80 or older with them. For treatment visits, patients can not have anyone with them due to social distancing guidelines and our immunocompromised population.

## 2021-06-23 NOTE — Progress Notes (Signed)
Patient presents today for possible treatment, possible blood products and follow up visit with Dr. Delton Coombes. Blood pressure 90/70 on arrival. Patient states, " when I stand up I feel like I am going to pass out." Labs pending. Patient states he was hospitalized for low blood pressure and discharged on 1/31. Patient has no complaints at this time. Patient denies any significant changes since his last visit. Patient states , " My blood pressure has been low and I seem to get dizzy when I stand."   No treatment today. Per A. Anderson RN / Dr. Delton Coombes discontinue treatment at this time. Administer 1 Unit of blood today and 1 Liter of NS over 2 hours. Patient to return for labs every 2 weeks with possible blood products per Dr. Claudina Lick A. Beckie Salts .   I liter of Normal Saline given today per MD orders. Tolerated infusion without adverse affects. Vital signs stable. No complaints at this time. Discharged from clinic by motorized scooter in stable condition. Alert and oriented x 3. F/U with [facility] as scheduled.

## 2021-06-24 ENCOUNTER — Inpatient Hospital Stay (HOSPITAL_COMMUNITY): Payer: Medicare Other

## 2021-06-24 VITALS — BP 101/57 | HR 67 | Temp 96.0°F | Resp 18

## 2021-06-24 DIAGNOSIS — D649 Anemia, unspecified: Secondary | ICD-10-CM

## 2021-06-24 DIAGNOSIS — Z79899 Other long term (current) drug therapy: Secondary | ICD-10-CM | POA: Diagnosis not present

## 2021-06-24 DIAGNOSIS — D469 Myelodysplastic syndrome, unspecified: Secondary | ICD-10-CM

## 2021-06-24 DIAGNOSIS — Z681 Body mass index (BMI) 19 or less, adult: Secondary | ICD-10-CM | POA: Diagnosis not present

## 2021-06-24 DIAGNOSIS — E441 Mild protein-calorie malnutrition: Secondary | ICD-10-CM | POA: Diagnosis not present

## 2021-06-24 DIAGNOSIS — J449 Chronic obstructive pulmonary disease, unspecified: Secondary | ICD-10-CM | POA: Diagnosis not present

## 2021-06-24 DIAGNOSIS — I959 Hypotension, unspecified: Secondary | ICD-10-CM | POA: Diagnosis not present

## 2021-06-24 DIAGNOSIS — E876 Hypokalemia: Secondary | ICD-10-CM | POA: Diagnosis not present

## 2021-06-24 DIAGNOSIS — D61818 Other pancytopenia: Secondary | ICD-10-CM | POA: Diagnosis not present

## 2021-06-24 MED ORDER — SODIUM CHLORIDE 0.9% IV SOLUTION
250.0000 mL | Freq: Once | INTRAVENOUS | Status: AC
  Administered 2021-06-24: 250 mL via INTRAVENOUS

## 2021-06-24 MED ORDER — HEPARIN SOD (PORK) LOCK FLUSH 100 UNIT/ML IV SOLN
500.0000 [IU] | Freq: Every day | INTRAVENOUS | Status: AC | PRN
  Administered 2021-06-24: 500 [IU]

## 2021-06-24 MED ORDER — ACETAMINOPHEN 325 MG PO TABS
650.0000 mg | ORAL_TABLET | Freq: Once | ORAL | Status: AC
  Administered 2021-06-24: 650 mg via ORAL
  Filled 2021-06-24: qty 2

## 2021-06-24 MED ORDER — SODIUM CHLORIDE 0.9% FLUSH
10.0000 mL | INTRAVENOUS | Status: AC | PRN
  Administered 2021-06-24: 10 mL

## 2021-06-24 MED ORDER — DIPHENHYDRAMINE HCL 25 MG PO CAPS
25.0000 mg | ORAL_CAPSULE | Freq: Once | ORAL | Status: AC
  Administered 2021-06-24: 25 mg via ORAL
  Filled 2021-06-24: qty 1

## 2021-06-24 NOTE — Progress Notes (Signed)
Patient presents today for blood transfusion.  Patient is in satisfactory condition with no new complaints voiced.  Vital signs are stable.  We will proceed with transfusion per MD orders.   Patient tolerated transfusion well with no complaints voiced.  Patient left via wheelchair in stable condition.  Vital signs stable at discharge.  Follow up as scheduled.

## 2021-06-24 NOTE — Patient Instructions (Signed)
[facility]  Discharge Instructions: Thank you for choosing [facility] to provide your oncology and hematology care.  If you have a lab appointment with the Cancer Center, please come in thru the Main Entrance and check in at the main information desk.  Wear comfortable clothing and clothing appropriate for easy access to any Portacath or PICC line.   We strive to give you quality time with your provider. You may need to reschedule your appointment if you arrive late (15 or more minutes).  Arriving late affects you and other patients whose appointments are after yours.  Also, if you miss three or more appointments without notifying the office, you may be dismissed from the clinic at the provider's discretion.      For prescription refill requests, have your pharmacy contact our office and allow 72 hours for refills to be completed.        To help prevent nausea and vomiting after your treatment, we encourage you to take your nausea medication as directed.  BELOW ARE SYMPTOMS THAT SHOULD BE REPORTED IMMEDIATELY: *FEVER GREATER THAN 100.4 F (38 C) OR HIGHER *CHILLS OR SWEATING *NAUSEA AND VOMITING THAT IS NOT CONTROLLED WITH YOUR NAUSEA MEDICATION *UNUSUAL SHORTNESS OF BREATH *UNUSUAL BRUISING OR BLEEDING *URINARY PROBLEMS (pain or burning when urinating, or frequent urination) *BOWEL PROBLEMS (unusual diarrhea, constipation, pain near the anus) TENDERNESS IN MOUTH AND THROAT WITH OR WITHOUT PRESENCE OF ULCERS (sore throat, sores in mouth, or a toothache) UNUSUAL RASH, SWELLING OR PAIN  UNUSUAL VAGINAL DISCHARGE OR ITCHING   Items with * indicate a potential emergency and should be followed up as soon as possible or go to the Emergency Department if any problems should occur.  Please show the CHEMOTHERAPY ALERT CARD or IMMUNOTHERAPY ALERT CARD at check-in to the Emergency Department and triage nurse.  Should you have questions after your visit or need to cancel  or reschedule your appointment, please contact [facility] [phone]  and follow the prompts.  Office hours are 8:00 a.m. to 4:30 p.m. Monday - Friday. Please note that voicemails left after 4:00 p.m. may not be returned until the following business day.  We are closed weekends and major holidays. You have access to a nurse at all times for urgent questions. Please call the main number to the clinic [phone] and follow the prompts.  For any non-urgent questions, you may also contact your provider using MyChart. We now offer e-Visits for anyone 18 and older to request care online for non-urgent symptoms. For details visit [email or web address].   Also download the MyChart app! Go to the app store, search "MyChart", open the app, select [facility], and log in with your MyChart username and password.  Due to Covid, a mask is required upon entering the hospital/clinic. If you do not have a mask, one will be given to you upon arrival. For doctor visits, patients may have 1 support person aged 18 or older with them. For treatment visits, patients cannot have anyone with them due to current Covid guidelines and our immunocompromised population.  

## 2021-06-25 LAB — TYPE AND SCREEN
ABO/RH(D): B NEG
Antibody Screen: NEGATIVE
Unit division: 0

## 2021-06-25 LAB — BPAM RBC
Blood Product Expiration Date: 202303052359
ISSUE DATE / TIME: 202302071002
Unit Type and Rh: 1700

## 2021-06-30 ENCOUNTER — Encounter (HOSPITAL_COMMUNITY): Payer: Medicare Other

## 2021-06-30 ENCOUNTER — Other Ambulatory Visit (HOSPITAL_COMMUNITY): Payer: Medicare Other

## 2021-07-07 ENCOUNTER — Other Ambulatory Visit: Payer: Self-pay

## 2021-07-07 ENCOUNTER — Inpatient Hospital Stay (HOSPITAL_COMMUNITY): Payer: Medicare Other

## 2021-07-07 ENCOUNTER — Encounter (HOSPITAL_COMMUNITY): Payer: Medicare Other

## 2021-07-07 ENCOUNTER — Inpatient Hospital Stay (HOSPITAL_BASED_OUTPATIENT_CLINIC_OR_DEPARTMENT_OTHER): Payer: Medicare Other | Admitting: Hematology

## 2021-07-07 ENCOUNTER — Other Ambulatory Visit (HOSPITAL_COMMUNITY): Payer: Medicare Other

## 2021-07-07 VITALS — BP 111/75 | HR 70 | Temp 97.3°F | Resp 18

## 2021-07-07 VITALS — BP 90/52 | HR 89 | Temp 96.7°F | Resp 18 | Wt 118.0 lb

## 2021-07-07 DIAGNOSIS — D61818 Other pancytopenia: Secondary | ICD-10-CM | POA: Diagnosis not present

## 2021-07-07 DIAGNOSIS — Z79899 Other long term (current) drug therapy: Secondary | ICD-10-CM | POA: Diagnosis not present

## 2021-07-07 DIAGNOSIS — D469 Myelodysplastic syndrome, unspecified: Secondary | ICD-10-CM

## 2021-07-07 DIAGNOSIS — D649 Anemia, unspecified: Secondary | ICD-10-CM | POA: Diagnosis not present

## 2021-07-07 DIAGNOSIS — E876 Hypokalemia: Secondary | ICD-10-CM | POA: Diagnosis not present

## 2021-07-07 DIAGNOSIS — I959 Hypotension, unspecified: Secondary | ICD-10-CM | POA: Diagnosis not present

## 2021-07-07 LAB — COMPREHENSIVE METABOLIC PANEL
ALT: 13 U/L (ref 0–44)
AST: 20 U/L (ref 15–41)
Albumin: 2.8 g/dL — ABNORMAL LOW (ref 3.5–5.0)
Alkaline Phosphatase: 61 U/L (ref 38–126)
Anion gap: 8 (ref 5–15)
BUN: 39 mg/dL — ABNORMAL HIGH (ref 8–23)
CO2: 21 mmol/L — ABNORMAL LOW (ref 22–32)
Calcium: 9.4 mg/dL (ref 8.9–10.3)
Chloride: 107 mmol/L (ref 98–111)
Creatinine, Ser: 1.87 mg/dL — ABNORMAL HIGH (ref 0.61–1.24)
GFR, Estimated: 34 mL/min — ABNORMAL LOW (ref >60)
Glucose, Bld: 115 mg/dL — ABNORMAL HIGH (ref 70–99)
Potassium: 3.3 mmol/L — ABNORMAL LOW (ref 3.5–5.1)
Sodium: 136 mmol/L (ref 135–145)
Total Bilirubin: 0.7 mg/dL (ref 0.3–1.2)
Total Protein: 6.4 g/dL — ABNORMAL LOW (ref 6.5–8.1)

## 2021-07-07 LAB — CBC WITH DIFFERENTIAL/PLATELET
Abs Immature Granulocytes: 0.15 10*3/uL — ABNORMAL HIGH (ref 0.00–0.07)
Basophils Absolute: 0 10*3/uL (ref 0.0–0.1)
Basophils Relative: 0 %
Eosinophils Absolute: 0 10*3/uL (ref 0.0–0.5)
Eosinophils Relative: 0 %
HCT: 20.2 % — ABNORMAL LOW (ref 39.0–52.0)
Hemoglobin: 6.4 g/dL — CL (ref 13.0–17.0)
Immature Granulocytes: 3 %
Lymphocytes Relative: 15 %
Lymphs Abs: 0.9 10*3/uL (ref 0.7–4.0)
MCH: 26.6 pg (ref 26.0–34.0)
MCHC: 31.7 g/dL (ref 30.0–36.0)
MCV: 83.8 fL (ref 80.0–100.0)
Monocytes Absolute: 0.5 10*3/uL (ref 0.1–1.0)
Monocytes Relative: 8 %
Neutro Abs: 4.3 10*3/uL (ref 1.7–7.7)
Neutrophils Relative %: 74 %
Platelets: 33 10*3/uL — ABNORMAL LOW (ref 150–400)
RBC: 2.41 MIL/uL — ABNORMAL LOW (ref 4.22–5.81)
RDW: 19.5 % — ABNORMAL HIGH (ref 11.5–15.5)
WBC: 5.8 10*3/uL (ref 4.0–10.5)
nRBC: 1.2 % — ABNORMAL HIGH (ref 0.0–0.2)

## 2021-07-07 LAB — LACTATE DEHYDROGENASE: LDH: 139 U/L (ref 98–192)

## 2021-07-07 LAB — PREPARE RBC (CROSSMATCH)

## 2021-07-07 LAB — MAGNESIUM: Magnesium: 2 mg/dL (ref 1.7–2.4)

## 2021-07-07 MED ORDER — SODIUM CHLORIDE 0.9% IV SOLUTION
250.0000 mL | Freq: Once | INTRAVENOUS | Status: AC
  Administered 2021-07-07: 250 mL via INTRAVENOUS

## 2021-07-07 MED ORDER — HEPARIN SOD (PORK) LOCK FLUSH 100 UNIT/ML IV SOLN
500.0000 [IU] | Freq: Every day | INTRAVENOUS | Status: AC | PRN
  Administered 2021-07-07: 500 [IU]

## 2021-07-07 MED ORDER — DIPHENHYDRAMINE HCL 25 MG PO CAPS
25.0000 mg | ORAL_CAPSULE | Freq: Once | ORAL | Status: AC
  Administered 2021-07-07: 25 mg via ORAL
  Filled 2021-07-07: qty 1

## 2021-07-07 MED ORDER — PREDNISONE 5 MG PO TABS
5.0000 mg | ORAL_TABLET | Freq: Every day | ORAL | 6 refills | Status: DC
Start: 2021-07-07 — End: 2021-07-16

## 2021-07-07 MED ORDER — ACETAMINOPHEN 325 MG PO TABS
650.0000 mg | ORAL_TABLET | Freq: Once | ORAL | Status: AC
  Administered 2021-07-07: 650 mg via ORAL
  Filled 2021-07-07: qty 2

## 2021-07-07 MED ORDER — SODIUM CHLORIDE 0.9% FLUSH
10.0000 mL | INTRAVENOUS | Status: AC | PRN
  Administered 2021-07-07: 10 mL

## 2021-07-07 MED ORDER — HEPARIN SOD (PORK) LOCK FLUSH 100 UNIT/ML IV SOLN: 250.0000 [IU] | INTRAVENOUS | Status: DC | PRN

## 2021-07-07 MED ORDER — POTASSIUM CHLORIDE CRYS ER 20 MEQ PO TBCR
20.0000 meq | EXTENDED_RELEASE_TABLET | Freq: Once | ORAL | Status: AC
  Administered 2021-07-07: 20 meq via ORAL
  Filled 2021-07-07: qty 1

## 2021-07-07 NOTE — Patient Instructions (Addendum)
[place] at [facility] Discharge Instructions   You were seen and examined today by Dr. Delton Coombes.  Your hemoglobin today is 6.4.  We will give you 2 units of blood today.  Continue midodrine to help keep your blood pressure up and to help prevent lightheadedness. You need to increase the dose to 2 pills three times a day.   Return as scheduled for lab work and possible blood.    Thank you for choosing [place] at [facility] to provide your oncology and hematology care.  To afford each patient quality time with our provider, please arrive at least 15 minutes before your scheduled appointment time.   If you have a lab appointment with the [facility] please come in thru the Main Entrance and check in at the main information desk.  You need to re-schedule your appointment should you arrive 10 or more minutes late.  We strive to give you quality time with our providers, and arriving late affects you and other patients whose appointments are after yours.  Also, if you no show three or more times for appointments you may be dismissed from the clinic at the providers discretion.     Again, thank you for choosing [facility].  Our hope is that these requests will decrease the amount of time that you wait before being seen by our physicians.       _____________________________________________________________  Should you have questions after your visit to [facility], please contact our office at [phone] and follow the prompts.  Our office hours are 8:00 a.m. and 4:30 p.m. Monday - Friday.  Please note that voicemails left after 4:00 p.m. may not be returned until the following business day.  We are closed weekends and major holidays.  You do have access to a nurse 24-7, just call the main number to the clinic [phone] and do not press any options, hold on the line and a nurse will answer the phone.    For  prescription refill requests, have your pharmacy contact our office and allow 72 hours.    Due to Covid, you will need to wear a mask upon entering the hospital. If you do not have a mask, a mask will be given to you at the Main Entrance upon arrival. For doctor visits, patients may have 1 support person age 33 or older with them. For treatment visits, patients can not have anyone with them due to social distancing guidelines and our immunocompromised population.

## 2021-07-07 NOTE — Progress Notes (Signed)
Patients port flushed without difficulty.  Good blood return noted with no bruising or swelling noted at site.  Patient remains accessed for possible blood and/or treatment.  Patient in stable condition.

## 2021-07-07 NOTE — Progress Notes (Signed)
Patient presents today for labwork, possible blood and follow up visit with Dr. Delton Coombes. Blood pressure on arrival 90/52. Patient states he was fine this morning and drove himself to the [facility]. Upon arrival he became dizzy and needed  a wheel chair to get up to the clinic. Labs pending. Patient states, " I coughed up some blood".   Message received from A. Anderson RN/ Dr. Delton Coombes to give 20 mEq  Potassium Chloride (K-DUR) x 1 dose now. Potassium 3.3 today.    2 Units of blood given today per MD orders. Tolerated infusion without adverse affects. Vital signs stable. No complaints at this time. Discharged from clinic by wheel chair in stable condition. Alert and oriented x 3. F/U with [facility] as scheduled.

## 2021-07-07 NOTE — Progress Notes (Signed)
[name], [name] [id number]   CLINIC:  Medical Oncology/Hematology  PCP:  Sharilyn Sites, [place] / [place] [address]  [phone]  REASON FOR VISIT:  Follow-up for MDS  PRIOR THERAPY: Aranesp, Retacrit, intermittent PRBC transfusions  CURRENT THERAPY: Azacitidine (initiated on 09/16/2020)  INTERVAL HISTORY:  [name], a 88 y.o. male, returns for routine follow-up for his MDS. [name] was last seen on 06/23/2021.  Today he reports feeling fair. He denies any falls over the past 2 weeks. He continues to feel light-headed occasionally upon standing. He does not monitor his BP at home. He reports he has been taking prednisone, but he ran out of pills 3-4 weeks ago and was unable to refill the prescription.   REVIEW OF SYSTEMS:  Review of Systems  Constitutional:  Positive for appetite change and fatigue.  Respiratory:  Positive for cough and shortness of breath.   Cardiovascular:  Positive for palpitations.  Neurological:  Positive for dizziness and light-headedness.  Psychiatric/Behavioral:  Positive for depression. The patient is nervous/anxious.   All other systems reviewed and are negative.  PAST MEDICAL/SURGICAL HISTORY:  Past Medical History:  Diagnosis Date   Bone cancer ([place])    Chronic kidney insufficiency    COPD (chronic obstructive pulmonary disease) (HCC)    GERD (gastroesophageal reflux disease)    Pulmonary nodule    Past Surgical History:  Procedure Laterality Date   BACK SURGERY     CATARACT EXTRACTION W/PHACO Right 03/09/2016   Procedure: CATARACT EXTRACTION PHACO AND INTRAOCULAR LENS PLACEMENT RIGHT EYE CDE=8.58;  Surgeon: Tonny Branch, MD;  Location: [facility];  Service: Ophthalmology;  Laterality: Right;  right   CATARACT EXTRACTION W/PHACO Left 04/13/2016   Procedure: CATARACT EXTRACTION PHACO AND INTRAOCULAR LENS PLACEMENT (IOC);  Surgeon: Tonny Branch, MD;  Location: [facility];  Service:  Ophthalmology;  Laterality: Left;  CDE: 8.08   KYPHOPLASTY Bilateral 06/09/2019   Procedure: T11 KYPHOPLASTY;  Surgeon: Consuella Lose, MD;  Location: [facility];  Service: Neurosurgery;  Laterality: Bilateral;   None to Date  10/01/15   PORTACATH PLACEMENT Left 10/11/2020   Procedure: INSERTION PORT-A-CATH (attached catheter in left internal jugular);  Surgeon: Virl Cagey, MD;  Location: [facility];  Service: General;  Laterality: Left;    SOCIAL HISTORY:  Social History   Socioeconomic History   Marital status: Widowed    Spouse name: Not on file   Number of children: Not on file   Years of education: Not on file   Highest education level: Not on file  Occupational History   Not on file  Tobacco Use   Smoking status: Former    Years: 15.00    Types: Cigarettes, Cigars    Quit date: 10/01/1990    Years since quitting: 30.7   Smokeless tobacco: Never   Tobacco comments:    Quit x 25-30 years; 2 cigars daily when smoked  Vaping Use   Vaping Use: Never used  Substance and Sexual Activity   Alcohol use: Not Currently    Alcohol/week: 0.0 standard drinks   Drug use: No   Sexual activity: Never    Birth control/protection: None  Other Topics Concern   Not on file  Social History Narrative   Not on file   Social Determinants of Health   Financial Resource Strain: Not on file  Food Insecurity: Not on file  Transportation Needs: Not on file  Physical Activity: Not on file  Stress:  Not on file  Social Connections: Not on file  Intimate Partner Violence: Not on file    FAMILY HISTORY:  Family History  Problem Relation Age of Onset   Kidney disease Mother    Kidney disease Sister    Colon cancer Neg Hx    Gastric cancer Neg Hx    Esophageal cancer Neg Hx     CURRENT MEDICATIONS:  Current Outpatient Medications  Medication Sig Dispense Refill   albuterol (VENTOLIN HFA) 108 (90 Base) MCG/ACT inhaler Inhale 2 puffs into the lungs every 4 (four) hours as needed for  wheezing or shortness of breath. 18 g 2   midodrine (PROAMATINE) 2.5 MG tablet Take 1 tablet (2.5 mg total) by mouth 3 (three) times daily with meals. 270 each 3   No current facility-administered medications for this visit.   Facility-Administered Medications Ordered in Other Visits  Medication Dose Route Frequency Provider Last Rate Last Admin   0.9 %  sodium chloride infusion   Intravenous Continuous Derek Jack, MD   Stopped at 01/06/21 1018   octreotide (SANDOSTATIN LAR) 30 MG IM injection            sodium chloride flush (NS) 0.9 % injection 10 mL  10 mL Intracatheter PRN Derek Jack, MD   10 mL at 01/06/21 1215   sodium chloride flush (NS) 0.9 % injection 10 mL  10 mL Intravenous PRN Derek Jack, MD   10 mL at 02/17/21 0902   sodium chloride flush (NS) 0.9 % injection 10 mL  10 mL Intravenous PRN Derek Jack, MD   10 mL at 03/10/21 0936    ALLERGIES:  No Known Allergies  PHYSICAL EXAM:  Performance status (ECOG): 2 - Symptomatic, <50% confined to bed  Vitals:   07/07/21 0905  BP: (!) 90/52  Pulse: 89  Resp: 18  Temp: (!) 96.7 F (35.9 C)  SpO2: 94%   Wt Readings from Last 3 Encounters:  07/07/21 118 lb (53.5 kg)  06/23/21 121 lb (54.9 kg)  06/16/21 119 lb 0.8 oz (54 kg)   Physical Exam Vitals reviewed.  Constitutional:      Appearance: Normal appearance.  Cardiovascular:     Rate and Rhythm: Normal rate and regular rhythm.     Pulses: Normal pulses.     Heart sounds: Normal heart sounds.  Pulmonary:     Effort: Pulmonary effort is normal.     Breath sounds: Normal breath sounds.  Neurological:     General: No focal deficit present.     Mental Status: He is alert and oriented to person, place, and time.  Psychiatric:        Mood and Affect: Mood normal.        Behavior: Behavior normal.    LABORATORY DATA:  I have reviewed the labs as listed.  CBC Latest Ref Rng & Units 06/23/2021 06/17/2021 06/16/2021  WBC 4.0 - 10.5 K/uL  6.9 5.3 5.0  Hemoglobin 13.0 - 17.0 g/dL 8.3(L) 8.0(L) 7.0(L)  Hematocrit 39.0 - 52.0 % 26.2(L) 24.8(L) 22.9(L)  Platelets 150 - 400 K/uL 42(L) 31(L) 31(L)   CMP Latest Ref Rng & Units 06/17/2021 06/16/2021 06/09/2021  Glucose 70 - 99 mg/dL 97 90 113(H)  BUN 8 - 23 mg/dL 37(H) 38(H) 33(H)  Creatinine 0.61 - 1.24 mg/dL 2.04(H) 1.96(H) 2.22(H)  Sodium 135 - 145 mmol/L 135 133(L) 133(L)  Potassium 3.5 - 5.1 mmol/L 3.6 3.4(L) 3.3(L)  Chloride 98 - 111 mmol/L 109 109 103  CO2 22 - 32  mmol/L 21(L) 21(L) 20(L)  Calcium 8.9 - 10.3 mg/dL 8.8(L) 9.1 9.5  Total Protein 6.5 - 8.1 g/dL - 6.1(L) 6.8  Total Bilirubin 0.3 - 1.2 mg/dL - 0.8 0.9  Alkaline Phos 38 - 126 U/L - 59 70  AST 15 - 41 U/L - 17 21  ALT 0 - 44 U/L - 11 12      Component Value Date/Time   RBC 3.18 (L) 06/23/2021 0756   MCV 82.4 06/23/2021 0756   MCH 26.1 06/23/2021 0756   MCHC 31.7 06/23/2021 0756   RDW 18.6 (H) 06/23/2021 0756   LYMPHSABS 0.4 (L) 06/16/2021 1134   MONOABS 0.2 06/16/2021 1134   EOSABS 0.0 06/16/2021 1134   BASOSABS 0.0 06/16/2021 1134    DIAGNOSTIC IMAGING:  I have independently reviewed the scans and discussed with the patient. DG Chest Port 1 View  Result Date: 06/16/2021 CLINICAL DATA:  An 88 year old male presents with question of sepsis. EXAM: PORTABLE CHEST 1 VIEW COMPARISON:  February 15, 2021 radiographs and CT imaging from that date. FINDINGS: EKG leads project over the chest. LEFT-sided Port-A-Cath terminates at the caval to atrial junction. Cardiomediastinal contours and hilar structures are stable elongated due to hyperinflation that has been present over a series of prior studies. Interstitial thickening which was present previously is is perhaps slightly worse compared to the study of February 15, 2021. No lobar consolidative process.  No visible pneumothorax. Signs of cement augmentation and thoracic lumbar vertebral bodies not well evaluated. IMPRESSION: Chronic interstitial thickening with some  worsening may reflect worsening of background interstitial lung disease. The possibility of superimposed infection is not excluded. No signs of lobar consolidation or visible evidence of pleural effusion. Electronically Signed   By: Zetta Bills M.D.   On: 06/16/2021 09:05     ASSESSMENT:  1.  Myelodysplastic syndrome with pancytopenia: - Bone marrow biopsy on 12/08/2016 in [place], [place] showed normocellular marrow with trilineage hematopoiesis with erythroid predominance and dysmegakaryopoiesis.  Blasts were not increased.  Cytogenetics showed 46, XY,del(12)(p.2p13)[8]/46,XY[12].  FISH was positive for 12p-. - Bone marrow biopsy and testing in 2018 was consistent with myelodysplastic syndrome with single lineage dysplasia (MDS-SLD),IPSS-R score of 2, consistent with low risk category -  Repeat bone marrow biopsy performed on 08/13/2019 by Dr. Delton Coombes with the following results: Hypercellular bone marrow with dyspoietic changes Bone marrow aspirate with 5% blastic cells of granulocytic precursors Flow pathology with 3% blasts Peripheral blood with 2% circulating blasts Cytogenetic analysis show del(12p) and 13 of the examined cells with remaining 7 cells showing normal chromosome Karyotype: 46,XY,del(12)(p13p11.2)[13]/46,XY[7] FISH analysis was normal - Based on most recent bone marrow biopsy (08/04/2020), patient's IPSS-R is 5, showing that he has progressed to the high risk category - He has required multiple blood transfusions over the past 6 months, most recently on 08/09/2020 (Hgb 7.1) - No improvement on monthly Aranesp or on weekly Retacrit injections (started on 07/10/2020 - discontinued 09/04/2020) - Stool occult blood cards x3 were negative -Azacitidine (50 mg/m2) x5 days started on 09/16/2020.  Seventh cycle completed on 04/21/2021. - Bone marrow biopsy on 05/26/2021-hypercellular marrow with aspirate showing 6% blastic cells.  Previous bone marrow showed 5% blastic cells. - MDS FISH  panel was normal. - Cytogenetics showed 46, XY, del (12) in 10 chromosomes and 10 normal chromosomes which is similar to previous marrow.   PLAN:  1.  Myelodysplastic syndrome (high risk) with pancytopenia: - We have held azacitidine since his recent hospitalization due to syncopal episode. - We  are not treating his MDS at this time. - We will continue palliative blood transfusions as needed. - We reviewed labs today which showed hemoglobin was 6.4.  Platelet count is 33.  White count is 5.8 with normal differential.  LFTs are normal.  CKD is stable with creatinine 1.87.  Mild hypokalemia.  We will give potassium 20 mEq today. - He will receive 2 units PRBC. - Continue CBC every 2 weeks.  He will call us to have blood checked again in between. - RTC 4 weeks for follow-up.  2.  Hypotension: - Midodrine 2.5 mg 3 times daily started on 06/23/2021. - Blood pressure today is 90/52.  He had few episodes of dizziness but denies any falls. - Will increase midodrine to 5 mg 3 times daily. - He was reportedly on prednisone 5 to 10 mg daily for many years started in [place].  He ran out of prednisone about 3 to 4 weeks ago which could have contributed to his hypotension.  We will start him back on prednisone 5 mg daily.  3.  Bladder problems: - Continue Rapaflo which is helping his symptoms.  4.  Weight loss: - He has tried Marinol and Megace in the past which did not help. - Nutritional supplements like boost/Ensure were recommended.  5.  MDS/treatment related fatigue: - He uses Ritalin 5 mg in the mornings which did not help.  Orders placed this encounter:  No orders of the defined types were placed in this encounter.    Derek Jack, MD [facility] [phone]   I, Thana Ates, am acting as a scribe for Dr. Derek Jack.  I, Derek Jack MD, have reviewed the above documentation for accuracy and completeness, and I agree with the above.

## 2021-07-07 NOTE — Patient Instructions (Signed)
[facility]  Discharge Instructions: Thank you for choosing [facility] to provide your oncology and hematology care.  If you have a lab appointment with the [facility], please come in thru the Main Entrance and check in at the main information desk.  Wear comfortable clothing and clothing appropriate for easy access to any Portacath or PICC line.   We strive to give you quality time with your provider. You may need to reschedule your appointment if you arrive late (15 or more minutes).  Arriving late affects you and other patients whose appointments are after yours.  Also, if you miss three or more appointments without notifying the office, you may be dismissed from the clinic at the providers discretion.      For prescription refill requests, have your pharmacy contact our office and allow 72 hours for refills to be completed.    Today you received the following : 2 units of blood.       To help prevent nausea and vomiting after your treatment, we encourage you to take your nausea medication as directed.  BELOW ARE SYMPTOMS THAT SHOULD BE REPORTED IMMEDIATELY: *FEVER GREATER THAN 100.4 F (38 C) OR HIGHER *CHILLS OR SWEATING *NAUSEA AND VOMITING THAT IS NOT CONTROLLED WITH YOUR NAUSEA MEDICATION *UNUSUAL SHORTNESS OF BREATH *UNUSUAL BRUISING OR BLEEDING *URINARY PROBLEMS (pain or burning when urinating, or frequent urination) *BOWEL PROBLEMS (unusual diarrhea, constipation, pain near the anus) TENDERNESS IN MOUTH AND THROAT WITH OR WITHOUT PRESENCE OF ULCERS (sore throat, sores in mouth, or a toothache) UNUSUAL RASH, SWELLING OR PAIN  UNUSUAL VAGINAL DISCHARGE OR ITCHING   Items with * indicate a potential emergency and should be followed up as soon as possible or go to the Emergency Department if any problems should occur.  Please show the CHEMOTHERAPY ALERT CARD or IMMUNOTHERAPY ALERT CARD at check-in to the Emergency Department and triage  nurse.  Should you have questions after your visit or need to cancel or reschedule your appointment, please contact [facility] [phone]  and follow the prompts.  Office hours are 8:00 a.m. to 4:30 p.m. Monday - Friday. Please note that voicemails left after 4:00 p.m. may not be returned until the following business day.  We are closed weekends and major holidays. You have access to a nurse at all times for urgent questions. Please call the main number to the clinic [phone] and follow the prompts.  For any non-urgent questions, you may also contact your provider using MyChart. We now offer e-Visits for anyone 44 and older to request care online for non-urgent symptoms. For details visit [email or web address].   Also download the MyChart app! Go to the app store, search "MyChart", open the app, select [facility], and log in with your MyChart username and password.  Due to Covid, a mask is required upon entering the hospital/clinic. If you do not have a mask, one will be given to you upon arrival. For doctor visits, patients may have 1 support person aged 3 or older with them. For treatment visits, patients cannot have anyone with them due to current Covid guidelines and our immunocompromised population.

## 2021-07-07 NOTE — Progress Notes (Signed)
CRITICAL VALUE ALERT Critical value received:  hemoglobin 6.4  Date of notification:  07-07-21 Time of notification: 0948 Critical value read back:  Yes.   Nurse who received alert:  C. Teva Bronkema RN MD notified time and response:  [phone] will give 2 units per orders

## 2021-07-08 LAB — BPAM RBC
Blood Product Expiration Date: 202303082359
Blood Product Expiration Date: 202303092359
ISSUE DATE / TIME: 202302201042
ISSUE DATE / TIME: 202302201225
Unit Type and Rh: 1700
Unit Type and Rh: 1700

## 2021-07-08 LAB — TYPE AND SCREEN
ABO/RH(D): B NEG
Antibody Screen: NEGATIVE
Unit division: 0
Unit division: 0

## 2021-07-09 ENCOUNTER — Encounter (HOSPITAL_COMMUNITY): Payer: Self-pay | Admitting: *Deleted

## 2021-07-09 NOTE — Progress Notes (Signed)
Received request for clearance for extraction of 3 teeth.  Signed per Dr. Delton Coombes with note stating additional measures should be taken to reach hemostasis due to low platelet count.  Form scanned to Epic.

## 2021-07-12 ENCOUNTER — Other Ambulatory Visit: Payer: Self-pay

## 2021-07-12 ENCOUNTER — Encounter (HOSPITAL_COMMUNITY): Payer: Self-pay | Admitting: Emergency Medicine

## 2021-07-12 ENCOUNTER — Inpatient Hospital Stay (HOSPITAL_COMMUNITY)
Admission: EM | Admit: 2021-07-12 | Discharge: 2021-07-16 | DRG: 812 | Disposition: A | Discharge status: Home / Self Care | Payer: Medicare Other | Attending: Family Medicine | Admitting: Family Medicine

## 2021-07-12 ENCOUNTER — Emergency Department (HOSPITAL_COMMUNITY): Payer: Medicare Other

## 2021-07-12 DIAGNOSIS — D469 Myelodysplastic syndrome, unspecified: Principal | ICD-10-CM | POA: Diagnosis present

## 2021-07-12 DIAGNOSIS — Z7952 Long term (current) use of systemic steroids: Secondary | ICD-10-CM | POA: Diagnosis not present

## 2021-07-12 DIAGNOSIS — D638 Anemia in other chronic diseases classified elsewhere: Secondary | ICD-10-CM | POA: Diagnosis not present

## 2021-07-12 DIAGNOSIS — N1832 Chronic kidney disease, stage 3b: Secondary | ICD-10-CM | POA: Diagnosis present

## 2021-07-12 DIAGNOSIS — R42 Dizziness and giddiness: Secondary | ICD-10-CM | POA: Diagnosis not present

## 2021-07-12 DIAGNOSIS — Z87891 Personal history of nicotine dependence: Secondary | ICD-10-CM

## 2021-07-12 DIAGNOSIS — D696 Thrombocytopenia, unspecified: Secondary | ICD-10-CM | POA: Diagnosis present

## 2021-07-12 DIAGNOSIS — D649 Anemia, unspecified: Secondary | ICD-10-CM | POA: Diagnosis not present

## 2021-07-12 DIAGNOSIS — K2970 Gastritis, unspecified, without bleeding: Secondary | ICD-10-CM | POA: Diagnosis not present

## 2021-07-12 DIAGNOSIS — Z7401 Bed confinement status: Secondary | ICD-10-CM

## 2021-07-12 DIAGNOSIS — N289 Disorder of kidney and ureter, unspecified: Secondary | ICD-10-CM | POA: Diagnosis not present

## 2021-07-12 DIAGNOSIS — E46 Unspecified protein-calorie malnutrition: Secondary | ICD-10-CM | POA: Insufficient documentation

## 2021-07-12 DIAGNOSIS — Z9841 Cataract extraction status, right eye: Secondary | ICD-10-CM | POA: Diagnosis not present

## 2021-07-12 DIAGNOSIS — K219 Gastro-esophageal reflux disease without esophagitis: Secondary | ICD-10-CM | POA: Diagnosis present

## 2021-07-12 DIAGNOSIS — R0602 Shortness of breath: Secondary | ICD-10-CM | POA: Diagnosis not present

## 2021-07-12 DIAGNOSIS — I959 Hypotension, unspecified: Secondary | ICD-10-CM | POA: Diagnosis not present

## 2021-07-12 DIAGNOSIS — C9000 Multiple myeloma not having achieved remission: Secondary | ICD-10-CM | POA: Diagnosis present

## 2021-07-12 DIAGNOSIS — N1831 Chronic kidney disease, stage 3a: Secondary | ICD-10-CM | POA: Diagnosis not present

## 2021-07-12 DIAGNOSIS — K922 Gastrointestinal hemorrhage, unspecified: Secondary | ICD-10-CM

## 2021-07-12 DIAGNOSIS — R55 Syncope and collapse: Secondary | ICD-10-CM

## 2021-07-12 DIAGNOSIS — R0902 Hypoxemia: Secondary | ICD-10-CM | POA: Diagnosis not present

## 2021-07-12 DIAGNOSIS — Z79899 Other long term (current) drug therapy: Secondary | ICD-10-CM

## 2021-07-12 DIAGNOSIS — K208 Other esophagitis without bleeding: Secondary | ICD-10-CM | POA: Diagnosis not present

## 2021-07-12 DIAGNOSIS — J441 Chronic obstructive pulmonary disease with (acute) exacerbation: Secondary | ICD-10-CM | POA: Diagnosis not present

## 2021-07-12 DIAGNOSIS — Z7189 Other specified counseling: Secondary | ICD-10-CM | POA: Diagnosis not present

## 2021-07-12 DIAGNOSIS — Z961 Presence of intraocular lens: Secondary | ICD-10-CM | POA: Diagnosis present

## 2021-07-12 DIAGNOSIS — Z9842 Cataract extraction status, left eye: Secondary | ICD-10-CM | POA: Diagnosis not present

## 2021-07-12 DIAGNOSIS — R195 Other fecal abnormalities: Secondary | ICD-10-CM | POA: Diagnosis not present

## 2021-07-12 DIAGNOSIS — Z800 Family history of malignant neoplasm of digestive organs: Secondary | ICD-10-CM

## 2021-07-12 DIAGNOSIS — E440 Moderate protein-calorie malnutrition: Secondary | ICD-10-CM | POA: Diagnosis present

## 2021-07-12 DIAGNOSIS — K2289 Other specified disease of esophagus: Secondary | ICD-10-CM | POA: Diagnosis not present

## 2021-07-12 DIAGNOSIS — J9611 Chronic respiratory failure with hypoxia: Secondary | ICD-10-CM | POA: Diagnosis present

## 2021-07-12 DIAGNOSIS — K921 Melena: Secondary | ICD-10-CM | POA: Diagnosis not present

## 2021-07-12 DIAGNOSIS — K2971 Gastritis, unspecified, with bleeding: Secondary | ICD-10-CM | POA: Diagnosis not present

## 2021-07-12 DIAGNOSIS — J449 Chronic obstructive pulmonary disease, unspecified: Secondary | ICD-10-CM | POA: Diagnosis present

## 2021-07-12 DIAGNOSIS — R54 Age-related physical debility: Secondary | ICD-10-CM | POA: Diagnosis present

## 2021-07-12 DIAGNOSIS — Z681 Body mass index (BMI) 19 or less, adult: Secondary | ICD-10-CM | POA: Diagnosis not present

## 2021-07-12 DIAGNOSIS — D509 Iron deficiency anemia, unspecified: Secondary | ICD-10-CM | POA: Diagnosis not present

## 2021-07-12 DIAGNOSIS — N183 Chronic kidney disease, stage 3 unspecified: Secondary | ICD-10-CM | POA: Diagnosis present

## 2021-07-12 DIAGNOSIS — K449 Diaphragmatic hernia without obstruction or gangrene: Secondary | ICD-10-CM | POA: Diagnosis not present

## 2021-07-12 DIAGNOSIS — R531 Weakness: Secondary | ICD-10-CM | POA: Diagnosis not present

## 2021-07-12 DIAGNOSIS — Z515 Encounter for palliative care: Secondary | ICD-10-CM

## 2021-07-12 DIAGNOSIS — K209 Esophagitis, unspecified without bleeding: Secondary | ICD-10-CM | POA: Diagnosis not present

## 2021-07-12 DIAGNOSIS — D61818 Other pancytopenia: Secondary | ICD-10-CM | POA: Diagnosis present

## 2021-07-12 DIAGNOSIS — Z20822 Contact with and (suspected) exposure to covid-19: Secondary | ICD-10-CM | POA: Diagnosis present

## 2021-07-12 LAB — CBC WITH DIFFERENTIAL/PLATELET
Abs Immature Granulocytes: 0.36 10*3/uL — ABNORMAL HIGH (ref 0.00–0.07)
Basophils Absolute: 0 10*3/uL (ref 0.0–0.1)
Basophils Relative: 0 %
Eosinophils Absolute: 0 10*3/uL (ref 0.0–0.5)
Eosinophils Relative: 0 %
HCT: 16.6 % — ABNORMAL LOW (ref 39.0–52.0)
Hemoglobin: 5.3 g/dL — CL (ref 13.0–17.0)
Immature Granulocytes: 6 %
Lymphocytes Relative: 13 %
Lymphs Abs: 0.8 10*3/uL (ref 0.7–4.0)
MCH: 27.5 pg (ref 26.0–34.0)
MCHC: 31.9 g/dL (ref 30.0–36.0)
MCV: 86 fL (ref 80.0–100.0)
Monocytes Absolute: 0.6 10*3/uL (ref 0.1–1.0)
Monocytes Relative: 11 %
Neutro Abs: 4.3 10*3/uL (ref 1.7–7.7)
Neutrophils Relative %: 70 %
Platelets: 41 10*3/uL — ABNORMAL LOW (ref 150–400)
RBC: 1.93 MIL/uL — ABNORMAL LOW (ref 4.22–5.81)
RDW: 19.2 % — ABNORMAL HIGH (ref 11.5–15.5)
WBC: 6.1 10*3/uL (ref 4.0–10.5)
nRBC: 2.3 % — ABNORMAL HIGH (ref 0.0–0.2)

## 2021-07-12 LAB — COMPREHENSIVE METABOLIC PANEL
ALT: 12 U/L (ref 0–44)
AST: 18 U/L (ref 15–41)
Albumin: 2.6 g/dL — ABNORMAL LOW (ref 3.5–5.0)
Alkaline Phosphatase: 51 U/L (ref 38–126)
Anion gap: 5 (ref 5–15)
BUN: 52 mg/dL — ABNORMAL HIGH (ref 8–23)
CO2: 23 mmol/L (ref 22–32)
Calcium: 9.3 mg/dL (ref 8.9–10.3)
Chloride: 110 mmol/L (ref 98–111)
Creatinine, Ser: 1.57 mg/dL — ABNORMAL HIGH (ref 0.61–1.24)
GFR, Estimated: 42 mL/min — ABNORMAL LOW (ref >60)
Glucose, Bld: 102 mg/dL — ABNORMAL HIGH (ref 70–99)
Potassium: 3.4 mmol/L — ABNORMAL LOW (ref 3.5–5.1)
Sodium: 138 mmol/L (ref 135–145)
Total Bilirubin: 0.5 mg/dL (ref 0.3–1.2)
Total Protein: 6.1 g/dL — ABNORMAL LOW (ref 6.5–8.1)

## 2021-07-12 LAB — RESP PANEL BY RT-PCR (FLU A&B, COVID) ARPGX2
Influenza A by PCR: NEGATIVE
Influenza B by PCR: NEGATIVE
SARS Coronavirus 2 by RT PCR: NEGATIVE

## 2021-07-12 LAB — MRSA NEXT GEN BY PCR, NASAL: MRSA by PCR Next Gen: NOT DETECTED

## 2021-07-12 LAB — TROPONIN I (HIGH SENSITIVITY)
Troponin I (High Sensitivity): 11 ng/L (ref <18)
Troponin I (High Sensitivity): 11 ng/L (ref <18)

## 2021-07-12 LAB — PROTIME-INR
INR: 1.2 (ref 0.8–1.2)
Prothrombin Time: 14.9 seconds (ref 11.4–15.2)

## 2021-07-12 LAB — APTT: aPTT: 25 seconds (ref 24–36)

## 2021-07-12 LAB — FIBRINOGEN: Fibrinogen: 245 mg/dL (ref 210–475)

## 2021-07-12 MED ORDER — TAMSULOSIN HCL 0.4 MG PO CAPS
0.4000 mg | ORAL_CAPSULE | Freq: Every day | ORAL | Status: DC
  Administered 2021-07-12 – 2021-07-15 (×4): 0.4 mg via ORAL
  Filled 2021-07-12 (×4): qty 1

## 2021-07-12 MED ORDER — PREDNISONE 10 MG PO TABS
5.0000 mg | ORAL_TABLET | Freq: Every day | ORAL | Status: DC
  Administered 2021-07-13 – 2021-07-16 (×3): 5 mg via ORAL
  Filled 2021-07-12 (×4): qty 1

## 2021-07-12 MED ORDER — IPRATROPIUM-ALBUTEROL 0.5-2.5 (3) MG/3ML IN SOLN
3.0000 mL | Freq: Once | RESPIRATORY_TRACT | Status: AC
  Administered 2021-07-12: 3 mL via RESPIRATORY_TRACT
  Filled 2021-07-12: qty 3

## 2021-07-12 MED ORDER — SODIUM CHLORIDE 0.9 % IV BOLUS
500.0000 mL | Freq: Once | INTRAVENOUS | Status: AC
  Administered 2021-07-12: 500 mL via INTRAVENOUS

## 2021-07-12 MED ORDER — PANTOPRAZOLE SODIUM 40 MG IV SOLR
40.0000 mg | Freq: Two times a day (BID) | INTRAVENOUS | Status: DC
  Administered 2021-07-12 – 2021-07-16 (×8): 40 mg via INTRAVENOUS
  Filled 2021-07-12 (×8): qty 10

## 2021-07-12 MED ORDER — METHYLPREDNISOLONE SODIUM SUCC 125 MG IJ SOLR
125.0000 mg | Freq: Once | INTRAMUSCULAR | Status: AC
  Administered 2021-07-12: 125 mg via INTRAVENOUS
  Filled 2021-07-12: qty 2

## 2021-07-12 MED ORDER — FUROSEMIDE 10 MG/ML IJ SOLN
20.0000 mg | Freq: Once | INTRAMUSCULAR | Status: AC
  Administered 2021-07-12: 20 mg via INTRAVENOUS
  Filled 2021-07-12: qty 2

## 2021-07-12 MED ORDER — PENICILLIN V POTASSIUM 250 MG PO TABS
500.0000 mg | ORAL_TABLET | Freq: Four times a day (QID) | ORAL | Status: DC
  Administered 2021-07-12 – 2021-07-16 (×13): 500 mg via ORAL
  Filled 2021-07-12: qty 1
  Filled 2021-07-12 (×5): qty 2
  Filled 2021-07-12: qty 1
  Filled 2021-07-12 (×2): qty 2
  Filled 2021-07-12: qty 1
  Filled 2021-07-12 (×2): qty 2
  Filled 2021-07-12: qty 16
  Filled 2021-07-12 (×5): qty 2
  Filled 2021-07-12 (×2): qty 1
  Filled 2021-07-12 (×2): qty 2
  Filled 2021-07-12: qty 1
  Filled 2021-07-12: qty 2
  Filled 2021-07-12: qty 1

## 2021-07-12 MED ORDER — ONDANSETRON HCL 4 MG PO TABS: 4.0000 mg | ORAL_TABLET | Freq: Four times a day (QID) | ORAL | Status: DC | PRN

## 2021-07-12 MED ORDER — ALBUTEROL SULFATE HFA 108 (90 BASE) MCG/ACT IN AERS: 2.0000 | INHALATION_SPRAY | RESPIRATORY_TRACT | Status: DC | PRN

## 2021-07-12 MED ORDER — SODIUM CHLORIDE 0.9 % IV SOLN
10.0000 mL/h | Freq: Once | INTRAVENOUS | Status: AC
  Administered 2021-07-12: 10 mL/h via INTRAVENOUS

## 2021-07-12 MED ORDER — ONDANSETRON HCL 4 MG/2ML IJ SOLN: 4.0000 mg | Freq: Four times a day (QID) | INTRAMUSCULAR | Status: DC | PRN

## 2021-07-12 MED ORDER — PANTOPRAZOLE SODIUM 40 MG IV SOLR: 40.0000 mg | Freq: Once | INTRAVENOUS | Status: DC

## 2021-07-12 MED ORDER — ONDANSETRON HCL 4 MG/2ML IJ SOLN
4.0000 mg | Freq: Once | INTRAMUSCULAR | Status: AC
  Administered 2021-07-12: 4 mg via INTRAVENOUS
  Filled 2021-07-12: qty 2

## 2021-07-12 MED ORDER — ALBUTEROL SULFATE (2.5 MG/3ML) 0.083% IN NEBU
2.5000 mg | INHALATION_SOLUTION | Freq: Once | RESPIRATORY_TRACT | Status: AC
  Administered 2021-07-12: 2.5 mg via RESPIRATORY_TRACT
  Filled 2021-07-12: qty 3

## 2021-07-12 MED ORDER — ENSURE ENLIVE PO LIQD
237.0000 mL | Freq: Two times a day (BID) | ORAL | Status: DC
  Administered 2021-07-13 (×2): 237 mL via ORAL

## 2021-07-12 MED ORDER — MIDODRINE HCL 5 MG PO TABS
2.5000 mg | ORAL_TABLET | Freq: Three times a day (TID) | ORAL | Status: DC
  Administered 2021-07-13 – 2021-07-16 (×7): 2.5 mg via ORAL
  Filled 2021-07-12 (×8): qty 1

## 2021-07-12 NOTE — H&P (Signed)
History and Physical    Patient: [name] ZOX:[id number] DOB: 22-Feb-1930 DOA: 07/12/2021 DOS: the patient was seen and examined on 07/12/2021 PCP: Sharilyn Sites, MD  Patient coming from: Home  Chief Complaint:  Chief Complaint  Patient presents with   Shortness of Breath    HPI: [name] is a 88 y.o. male with medical history significant of COPD, stage III chronic kidney disease, GERD, multiple myeloma.  Patient seen due to weakness, fatigue and has been increasing over the past several days.  Does have pancytopenia secondary to treatment for mild dysplastic syndrome and multiple myeloma.  He was seen in the cancer center on 2/20 and was found to be anemic to 6.4.  He received 2 units of blood.  While there, he mentioned to staff that he coughed up blood.  Additionally, he has been having dark stools, but denies frank melena.  Bloody stools that he recalls.  Denies chest pain, abdominal pain.  Review of Systems: As mentioned in the history of present illness. All other systems reviewed and are negative. Past Medical History:  Diagnosis Date   Bone cancer ([place])    Chronic kidney insufficiency    COPD (chronic obstructive pulmonary disease) (HCC)    GERD (gastroesophageal reflux disease)    Pulmonary nodule    Past Surgical History:  Procedure Laterality Date   BACK SURGERY     CATARACT EXTRACTION W/PHACO Right 03/09/2016   Procedure: CATARACT EXTRACTION PHACO AND INTRAOCULAR LENS PLACEMENT RIGHT EYE CDE=8.58;  Surgeon: Tonny Branch, MD;  Location: [facility];  Service: Ophthalmology;  Laterality: Right;  right   CATARACT EXTRACTION W/PHACO Left 04/13/2016   Procedure: CATARACT EXTRACTION PHACO AND INTRAOCULAR LENS PLACEMENT (IOC);  Surgeon: Tonny Branch, MD;  Location: [facility];  Service: Ophthalmology;  Laterality: Left;  CDE: 8.08   KYPHOPLASTY Bilateral 06/09/2019   Procedure: T11 KYPHOPLASTY;  Surgeon: Consuella Lose, MD;  Location: [facility];  Service: Neurosurgery;   Laterality: Bilateral;   None to Date  10/01/15   PORTACATH PLACEMENT Left 10/11/2020   Procedure: INSERTION PORT-A-CATH (attached catheter in left internal jugular);  Surgeon: Virl Cagey, MD;  Location: [facility];  Service: General;  Laterality: Left;   Social History:  reports that he quit smoking about 30 years ago. His smoking use included cigarettes and cigars. He has never used smokeless tobacco. He reports that he does not currently use alcohol. He reports that he does not use drugs.  No Known Allergies  Family History  Problem Relation Age of Onset   Kidney disease Mother    Kidney disease Sister    Colon cancer Neg Hx    Gastric cancer Neg Hx    Esophageal cancer Neg Hx     Prior to Admission medications   Medication Sig Start Date End Date Taking? Authorizing Provider  albuterol (VENTOLIN HFA) 108 (90 Base) MCG/ACT inhaler Inhale 2 puffs into the lungs every 4 (four) hours as needed for wheezing or shortness of breath. 04/04/20  Yes Emokpae, Courage, MD  midodrine (PROAMATINE) 2.5 MG tablet Take 1 tablet (2.5 mg total) by mouth 3 (three) times daily with meals. 06/23/21  Yes Derek Jack, MD  penicillin v potassium (VEETID) 500 MG tablet Take 500 mg by mouth every 6 (six) hours. 06/28/21  Yes [provider]  predniSONE (DELTASONE) 5 MG tablet Take 1 tablet (5 mg total) by mouth daily with breakfast. 07/07/21  Yes Derek Jack, MD  silodosin (RAPAFLO) 4 MG CAPS capsule Take 4 mg  by mouth at bedtime. 06/27/21  Yes [provider]    Physical Exam: Vitals:   07/12/21 1430 07/12/21 1500 07/12/21 1530 07/12/21 1630  BP: (!) 106/52 (!) 104/55 (!) 101/57 (!) 96/55  Pulse: 88 84 99 88  Resp: _0 SpO2: 100% 92% 94% 100%  Weight:      Height:       General: Elderly cachectic male. Awake and alert and oriented x3. No acute cardiopulmonary distress.  HEENT: Normocephalic atraumatic.  Right and left ears normal in appearance.  Pupils equal,  round, reactive to light. Extraocular muscles are intact. Sclerae anicteric and noninjected.  Pale mucosal membranes. No mucosal lesions.  Neck: Neck supple without lymphadenopathy. No carotid bruits. No masses palpated.  Cardiovascular: Regular rate with normal S1-S2 sounds. No murmurs, rubs, gallops auscultated. No JVD.  Respiratory: Good respiratory effort with no wheezes, rales, rhonchi. Lungs clear to auscultation bilaterally.  No accessory muscle use. Abdomen: Soft, nontender, nondistended. Active bowel sounds. No masses or hepatosplenomegaly  Skin: No rashes, lesions, or ulcerations.  Dry, warm to touch. 2+ dorsalis pedis and radial pulses. Musculoskeletal: No calf or leg pain. All major joints not erythematous nontender.  No upper or lower joint deformation.  Good ROM.  No contractures  Psychiatric: Intact judgment and insight. Pleasant and cooperative. Neurologic: No focal neurological deficits. Strength is 5/5 and symmetric in upper and lower extremities.  Cranial nerves II through XII are grossly intact.   Data Reviewed: Results for orders placed or performed during the hospital encounter of 07/12/21 (from the past 24 hour(s))  CBC with Differential/Platelet     Status: Abnormal   Collection Time: 07/12/21  2:05 PM  Result Value Ref Range   WBC 6.1 4.0 - 10.5 K/uL   RBC 1.93 (L) 4.22 - 5.81 MIL/uL   Hemoglobin 5.3 (LL) 13.0 - 17.0 g/dL   HCT 16.6 (L) 39.0 - 52.0 %   MCV 86.0 80.0 - 100.0 fL   MCH 27.5 26.0 - 34.0 pg   MCHC 31.9 30.0 - 36.0 g/dL   RDW 19.2 (H) 11.5 - 15.5 %   Platelets 41 (L) 150 - 400 K/uL   nRBC 2.3 (H) 0.0 - 0.2 %   Neutrophils Relative % 70 %   Neutro Abs 4.3 1.7 - 7.7 K/uL   Lymphocytes Relative 13 %   Lymphs Abs 0.8 0.7 - 4.0 K/uL   Monocytes Relative 11 %   Monocytes Absolute 0.6 0.1 - 1.0 K/uL   Eosinophils Relative 0 %   Eosinophils Absolute 0.0 0.0 - 0.5 K/uL   Basophils Relative 0 %   Basophils Absolute 0.0 0.0 - 0.1 K/uL   Immature  Granulocytes 6 %   Abs Immature Granulocytes 0.36 (H) 0.00 - 0.07 K/uL   Reactive, Benign Lymphocytes PRESENT    Acanthocytes PRESENT    Schistocytes PRESENT    Tear Drop Cells PRESENT    Ovalocytes PRESENT   Comprehensive metabolic panel     Status: Abnormal   Collection Time: 07/12/21  2:05 PM  Result Value Ref Range   Sodium 138 135 - 145 mmol/L   Potassium 3.4 (L) 3.5 - 5.1 mmol/L   Chloride 110 98 - 111 mmol/L   CO2 23 22 - 32 mmol/L   Glucose, Bld 102 (H) 70 - 99 mg/dL   BUN 52 (H) 8 - 23 mg/dL   Creatinine, Ser 1.57 (H) 0.61 - 1.24 mg/dL   Calcium 9.3 8.9 - 10.3 mg/dL   Total  Protein 6.1 (L) 6.5 - 8.1 g/dL   Albumin 2.6 (L) 3.5 - 5.0 g/dL   AST 18 15 - 41 U/L   ALT 12 0 - 44 U/L   Alkaline Phosphatase 51 38 - 126 U/L   Total Bilirubin 0.5 0.3 - 1.2 mg/dL   GFR, Estimated 42 (L) >60 mL/min   Anion gap 5 5 - 15  Troponin I (High Sensitivity)     Status: None   Collection Time: 07/12/21  2:05 PM  Result Value Ref Range   Troponin I (High Sensitivity) 11 <18 ng/L  Prepare RBC (crossmatch)     Status: None   Collection Time: 07/12/21  2:56 PM  Result Value Ref Range   Order Confirmation      ORDER PROCESSED BY BLOOD BANK Performed at [facility], [address] [place], [place] [address]   Type and screen     Status: None (Preliminary result)   Collection Time: 07/12/21  2:56 PM  Result Value Ref Range   ABO/RH(D) B NEG    Antibody Screen NEG    Sample Expiration 07/15/2021,2359    Unit Number [id number]    Blood Component Type RBC LR PHER2    Unit division 00    Status of Unit ALLOCATED    Transfusion Status OK TO TRANSFUSE    Crossmatch Result      Compatible Performed at [facility], [address] [place], [place] [address]    Unit Number [id number]    Blood Component Type RED CELLS,LR    Unit division 00    Status of Unit ALLOCATED    Transfusion Status OK TO TRANSFUSE    Crossmatch Result Compatible   Troponin I (High Sensitivity)     Status:  None   Collection Time: 07/12/21  3:18 PM  Result Value Ref Range   Troponin I (High Sensitivity) 11 <18 ng/L   DG Chest Port 1 View  Result Date: 07/12/2021 CLINICAL DATA:  Severe shortness of breath and weakness for 1 day. Near syncopal episode. COPD. EXAM: PORTABLE CHEST 1 VIEW COMPARISON:  06/16/2021 FINDINGS: Left-sided power port remains in appropriate position. Heart size is stable. Marked pulmonary hyperinflation is again demonstrated. Coarse diffuse interstitial infiltrates are unchanged. No evidence of superimposed pulmonary airspace disease or pleural effusion. Lower thoracic spine vertebroplasties again noted. IMPRESSION: Stable COPD and chronic interstitial lung disease. No acute findings. Electronically Signed   By: Marlaine Hind M.D.   On: 07/12/2021 14:16     Assessment and Plan: No notes have been filed under this hospital service. Service: Hospitalist  Principal Problem:   Acute on chronic anemia Active Problems:   COPD (chronic obstructive pulmonary disease) (HCC)   Myelodysplastic syndrome (HCC)   CKD (chronic kidney disease), stage IIIB   Discussed patient with EDP including relevant labs, imaging, and course of care while in the emergency department.  Acute on chronic anemia Admit to stepdown due to level of bleed Transfuse 3 units Check CBC in the morning I will also check anticoagulation: PT/INR, PTT, fibrinogen. GI bleed GI consulted Protonix twice daily COPD Compensated Multiple myeloma with pancytopenia At this point platelets 40,000.  We will hold off on platelet transfusion for the time being. CKD stage III  Advance Care Planning:   Code Status: Prior full code  Consults: GI  Family Communication: none  Severity of Illness: The appropriate patient status for this patient is INPATIENT. Inpatient status is judged to be reasonable and necessary in order to provide the  required intensity of service to ensure the patient's safety. The patient's  presenting symptoms, physical exam findings, and initial radiographic and laboratory data in the context of their chronic comorbidities is felt to place them at high risk for further clinical deterioration. Furthermore, it is not anticipated that the patient will be medically stable for discharge from the hospital within 2 midnights of admission.   * I certify that at the point of admission it is my clinical judgment that the patient will require inpatient hospital care spanning beyond 2 midnights from the point of admission due to high intensity of service, high risk for further deterioration and high frequency of surveillance required.*  Author: Truett Mainland, DO 07/12/2021 4:41 PM  For on call review [email or web address].

## 2021-07-12 NOTE — ED Triage Notes (Signed)
Pt is cancer pt on 3L [place] at home, increased SOB and weakness x 1 day, increased home O2 to 4L, upon EMS arrival 94% on non-rebreather, 86-90% on 6L [place] with EMS, hypotensive (84/53) and had period of unresponsiveness en route to hospital, given 1L, 4mg  Zofran, v/s stable after pt became responsive again

## 2021-07-12 NOTE — ED Notes (Signed)
Pt placed on 3L [place], O2 sats increased to 90-93%

## 2021-07-12 NOTE — ED Notes (Signed)
ED TO INPATIENT HANDOFF REPORT  ED Nurse Name and Phone #:   S Name/Age/Gender [name] 88 y.o. male Room/Bed: APA01/APA01  Code Status   Code Status: Full Code  Home/SNF/Other Home Patient oriented to: self, place, time, and situation Is this baseline? Yes   Triage Complete: Triage complete  Chief Complaint Acute on chronic anemia [D64.9]  Triage Note Pt is cancer pt on 3L [place] at home, increased SOB and weakness x 1 day, increased home O2 to 4L, upon EMS arrival 94% on non-rebreather, 86-90% on 6L  with EMS, hypotensive (84/53) and had period of unresponsiveness en route to hospital, given 1L, 4mg  Zofran, v/s stable after pt became responsive again   Allergies No Known Allergies  Level of Care/Admitting Diagnosis ED Disposition     ED Disposition  Admit   Condition  --   [place]: [facility] [[id number]]  Level of Care: Stepdown [14]  Covid Evaluation: Asymptomatic Screening Protocol (No Symptoms)  Diagnosis: Acute on chronic anemia [1027253]  Admitting Physician: Truett Mainland [[id number]]  Attending Physician: Truett Mainland [[id number]]  Estimated length of stay: 3 - 4 days  Certification:: I certify this patient will need inpatient services for at least 2 midnights          B Medical/Surgery History Past Medical History:  Diagnosis Date   Bone cancer ([place])    Chronic kidney insufficiency    COPD (chronic obstructive pulmonary disease) (HCC)    GERD (gastroesophageal reflux disease)    Pulmonary nodule    Past Surgical History:  Procedure Laterality Date   BACK SURGERY     CATARACT EXTRACTION W/PHACO Right 03/09/2016   Procedure: CATARACT EXTRACTION PHACO AND INTRAOCULAR LENS PLACEMENT RIGHT EYE CDE=8.58;  Surgeon: Tonny Branch, MD;  Location: [facility];  Service: Ophthalmology;  Laterality: Right;  right   CATARACT EXTRACTION W/PHACO Left 04/13/2016   Procedure: CATARACT EXTRACTION PHACO AND INTRAOCULAR LENS PLACEMENT (IOC);   Surgeon: Tonny Branch, MD;  Location: [facility];  Service: Ophthalmology;  Laterality: Left;  CDE: 8.08   KYPHOPLASTY Bilateral 06/09/2019   Procedure: T11 KYPHOPLASTY;  Surgeon: Consuella Lose, MD;  Location: [place];  Service: Neurosurgery;  Laterality: Bilateral;   None to Date  10/01/15   PORTACATH PLACEMENT Left 10/11/2020   Procedure: INSERTION PORT-A-CATH (attached catheter in left internal jugular);  Surgeon: Virl Cagey, MD;  Location: [facility];  Service: General;  Laterality: Left;     A IV Location/Drains/Wounds Patient Lines/Drains/Airways Status     Active Line/Drains/Airways     Name Placement date Placement time Site Days   Implanted Port 02/15/21 Left Chest 02/15/21  2007  Chest  147   Peripheral IV 07/12/21 20 G Right Antecubital 07/12/21  1257  Antecubital  less than 1   Incision (Closed) 10/11/20 Chest Left 10/11/20  0914  -- 274   Incision (Closed) 10/11/20 Neck Left 10/11/20  0925  -- 274            Intake/Output Last 24 hours No intake or output data in the 24 hours ending 07/12/21 1808  Labs/Imaging Results for orders placed or performed during the hospital encounter of 07/12/21 (from the past 48 hour(s))  CBC with Differential/Platelet     Status: Abnormal   Collection Time: 07/12/21  2:05 PM  Result Value Ref Range   WBC 6.1 4.0 - 10.5 K/uL   RBC 1.93 (L) 4.22 - 5.81 MIL/uL   Hemoglobin 5.3 (LL) 13.0 - 17.0 g/dL  Comment: This critical result has verified and been called to [facility] by Joaquin Courts on 02 25 2023 at 1435, and has been read back.    HCT 16.6 (L) 39.0 - 52.0 %   MCV 86.0 80.0 - 100.0 fL   MCH 27.5 26.0 - 34.0 pg   MCHC 31.9 30.0 - 36.0 g/dL   RDW 19.2 (H) 11.5 - 15.5 %   Platelets 41 (L) 150 - 400 K/uL    Comment: Immature Platelet Fraction may be clinically indicated, consider ordering this additional test [id number]    nRBC 2.3 (H) 0.0 - 0.2 %   Neutrophils Relative % 70 %   Neutro Abs 4.3 1.7 - 7.7 K/uL    Lymphocytes Relative 13 %   Lymphs Abs 0.8 0.7 - 4.0 K/uL   Monocytes Relative 11 %   Monocytes Absolute 0.6 0.1 - 1.0 K/uL   Eosinophils Relative 0 %   Eosinophils Absolute 0.0 0.0 - 0.5 K/uL   Basophils Relative 0 %   Basophils Absolute 0.0 0.0 - 0.1 K/uL   Immature Granulocytes 6 %   Abs Immature Granulocytes 0.36 (H) 0.00 - 0.07 K/uL   Reactive, Benign Lymphocytes PRESENT    Acanthocytes PRESENT    Schistocytes PRESENT    Tear Drop Cells PRESENT    Ovalocytes PRESENT     Comment: Performed at [facility], [address], [place], [place] [address]  Comprehensive metabolic panel     Status: Abnormal   Collection Time: 07/12/21  2:05 PM  Result Value Ref Range   Sodium 138 135 - 145 mmol/L   Potassium 3.4 (L) 3.5 - 5.1 mmol/L   Chloride 110 98 - 111 mmol/L   CO2 23 22 - 32 mmol/L   Glucose, Bld 102 (H) 70 - 99 mg/dL    Comment: Glucose reference range applies only to samples taken after fasting for at least 8 hours.   BUN 52 (H) 8 - 23 mg/dL   Creatinine, Ser 1.57 (H) 0.61 - 1.24 mg/dL   Calcium 9.3 8.9 - 10.3 mg/dL   Total Protein 6.1 (L) 6.5 - 8.1 g/dL   Albumin 2.6 (L) 3.5 - 5.0 g/dL   AST 18 15 - 41 U/L   ALT 12 0 - 44 U/L   Alkaline Phosphatase 51 38 - 126 U/L   Total Bilirubin 0.5 0.3 - 1.2 mg/dL   GFR, Estimated 42 (L) >60 mL/min    Comment: (NOTE) Calculated using the CKD-EPI Creatinine Equation (2021)    Anion gap 5 5 - 15    Comment: Performed at [facility], [address], [place], [place] [address]  Troponin I (High Sensitivity)     Status: None   Collection Time: 07/12/21  2:05 PM  Result Value Ref Range   Troponin I (High Sensitivity) 11 <18 ng/L    Comment: (NOTE) Elevated high sensitivity troponin I (hsTnI) values and significant  changes across serial measurements may suggest ACS but many other  chronic and acute conditions are known to elevate hsTnI results.  Refer to the "Links" section for chest pain algorithms and additional   guidance. Performed at [facility], [address], [place], [place] [address]   Prepare RBC (crossmatch)     Status: None   Collection Time: 07/12/21  2:56 PM  Result Value Ref Range   Order Confirmation      ORDER PROCESSED BY BLOOD BANK Performed at [facility], [address], [place], [place] [address]   Type  and screen     Status: None (Preliminary result)   Collection Time: 07/12/21  2:56 PM  Result Value Ref Range   ABO/RH(D) B NEG    Antibody Screen NEG    Sample Expiration 07/15/2021,2359    Unit Number [id number]    Blood Component Type RBC LR PHER2    Unit division 00    Status of Unit ISSUED    Transfusion Status OK TO TRANSFUSE    Crossmatch Result      Compatible Performed at [facility], [address], [place], [place] [address]    Unit Number [id number]    Blood Component Type RED CELLS,LR    Unit division 00    Status of Unit ALLOCATED    Transfusion Status OK TO TRANSFUSE    Crossmatch Result Compatible   Troponin I (High Sensitivity)     Status: None   Collection Time: 07/12/21  3:18 PM  Result Value Ref Range   Troponin I (High Sensitivity) 11 <18 ng/L    Comment: (NOTE) Elevated high sensitivity troponin I (hsTnI) values and significant  changes across serial measurements may suggest ACS but many other  chronic and acute conditions are known to elevate hsTnI results.  Refer to the "Links" section for chest pain algorithms and additional  guidance. Performed at [facility], [address] [place], [place] [address]   Resp Panel by RT-PCR (Flu A&B, Covid) Nasopharyngeal Swab     Status: None   Collection Time: 07/12/21  4:05 PM   Specimen: Nasopharyngeal Swab; Nasopharyngeal(NP) swabs in vial transport medium  Result Value Ref Range   SARS Coronavirus 2 by RT PCR NEGATIVE NEGATIVE    Comment: (NOTE) SARS-CoV-2 target nucleic acids are NOT DETECTED.  The SARS-CoV-2 RNA is generally detectable in upper respiratory specimens during the  acute phase of infection. The lowest concentration of SARS-CoV-2 viral copies this assay can detect is 138 copies/mL. A negative result does not preclude SARS-Cov-2 infection and should not be used as the sole basis for treatment or other patient management decisions. A negative result may occur with  improper specimen collection/handling, submission of specimen other than nasopharyngeal swab, presence of viral mutation(s) within the areas targeted by this assay, and inadequate number of viral copies(<138 copies/mL). A negative result must be combined with clinical observations, patient history, and epidemiological information. The expected result is Negative.  Fact Sheet for Patients:  EntrepreneurPulse.com.au  Fact Sheet for Healthcare Providers:  IncredibleEmployment.be  This test is no t yet approved or cleared by the [place] FDA and  has been authorized for detection and/or diagnosis of SARS-CoV-2 by FDA under an Emergency Use Authorization (EUA). This EUA will remain  in effect (meaning this test can be used) for the duration of the COVID-19 declaration under Section 564(b)(1) of the Act, 21 U.S.C.section 360bbb-3(b)(1), unless the authorization is terminated  or revoked sooner.       Influenza A by PCR NEGATIVE NEGATIVE   Influenza B by PCR NEGATIVE NEGATIVE    Comment: (NOTE) The Xpert Xpress SARS-CoV-2/FLU/RSV plus assay is intended as an aid in the diagnosis of influenza from Nasopharyngeal swab specimens and should not be used as a sole basis for treatment. Nasal washings and aspirates are unacceptable for Xpert Xpress SARS-CoV-2/FLU/RSV testing.  Fact Sheet for Patients: EntrepreneurPulse.com.au  Fact Sheet for Healthcare Providers: IncredibleEmployment.be  This test is not yet approved or cleared by the [place] FDA and has been authorized for detection and/or diagnosis of  SARS-CoV-2 by  FDA under an Emergency Use Authorization (EUA). This EUA will remain in effect (meaning this test can be used) for the duration of the COVID-19 declaration under Section 564(b)(1) of the Act, 21 U.S.C. section 360bbb-3(b)(1), unless the authorization is terminated or revoked.  Performed at [facility], [address] [place], [place] [address]    DG Chest Port 1 View  Result Date: 07/12/2021 CLINICAL DATA:  Severe shortness of breath and weakness for 1 day. Near syncopal episode. COPD. EXAM: PORTABLE CHEST 1 VIEW COMPARISON:  06/16/2021 FINDINGS: Left-sided power port remains in appropriate position. Heart size is stable. Marked pulmonary hyperinflation is again demonstrated. Coarse diffuse interstitial infiltrates are unchanged. No evidence of superimposed pulmonary airspace disease or pleural effusion. Lower thoracic spine vertebroplasties again noted. IMPRESSION: Stable COPD and chronic interstitial lung disease. No acute findings. Electronically Signed   By: Marlaine Hind M.D.   On: 07/12/2021 14:16    Pending Labs Unresulted Labs (From admission, onward)     Start     Ordered   07/13/21 0500  CBC  Tomorrow morning,   R        07/12/21 1745   07/13/21 4034  Basic metabolic panel  Tomorrow morning,   R        07/12/21 1745   07/12/21 1639  Protime-INR  Once,   R        07/12/21 1638   07/12/21 1639  APTT  Once,   R        07/12/21 1638   07/12/21 1639  Fibrinogen  Once,   R        07/12/21 1638            Vitals/Pain Today's Vitals   07/12/21 1757 07/12/21 1800 07/12/21 1800 07/12/21 1806  BP: (!) 109/56 (!) 79/61 (!) 109/56   Pulse:   80 82  Resp: 14 18 18 15   Temp:   98 F (36.7 C)   TempSrc:   Oral   SpO2:   94% 92%  Weight:      Height:      PainSc:        Isolation Precautions No active isolations  Medications Medications  0.9 %  sodium chloride infusion (has no administration in time range)  pantoprazole (PROTONIX) injection 40 mg (has no  administration in time range)  midodrine (PROAMATINE) tablet 2.5 mg (has no administration in time range)  penicillin v potassium (VEETID) tablet 500 mg (has no administration in time range)  predniSONE (DELTASONE) tablet 5 mg (has no administration in time range)  tamsulosin (FLOMAX) capsule 0.4 mg (has no administration in time range)  albuterol (VENTOLIN HFA) 108 (90 Base) MCG/ACT inhaler 2 puff (has no administration in time range)  ondansetron (ZOFRAN) tablet 4 mg (has no administration in time range)    Or  ondansetron (ZOFRAN) injection 4 mg (has no administration in time range)  pantoprazole (PROTONIX) injection 40 mg (has no administration in time range)  sodium chloride 0.9 % bolus 500 mL (500 mLs Intravenous New Bag/Given 07/12/21 1357)  ipratropium-albuterol (DUONEB) 0.5-2.5 (3) MG/3ML nebulizer solution 3 mL (3 mLs Nebulization Given 07/12/21 1408)  albuterol (PROVENTIL) (2.5 MG/3ML) 0.083% nebulizer solution 2.5 mg (2.5 mg Nebulization Given 07/12/21 1411)  methylPREDNISolone sodium succinate (SOLU-MEDROL) 125 mg/2 mL injection 125 mg (125 mg Intravenous Given 07/12/21 1357)  ondansetron (ZOFRAN) injection 4 mg (4 mg Intravenous Given 07/12/21 1411)    Mobility walks with device High fall risk   Focused Assessments  R Recommendations: See Admitting Provider Note  Report given to:   Additional Notes:

## 2021-07-12 NOTE — ED Notes (Signed)
Date and time results received: 07/12/21 1438  Test: Hgb Critical Value: 5.3  Name of Provider Notified: [place]  Orders Received? Or Actions Taken?:  n/a

## 2021-07-12 NOTE — ED Notes (Signed)
Pt reports having syncopal and near syncopal episodes intermittently at home over the past couple of weeks, reports he feels dizzy and vision blacks out, pt reports he will sit down until it passes

## 2021-07-12 NOTE — ED Notes (Signed)
Emesis x 3, EDP aware

## 2021-07-12 NOTE — ED Provider Notes (Signed)
[facility] EMERGENCY DEPARTMENT Provider Note   CSN: [id number] Arrival date & time: 07/12/21  1255     History  Chief Complaint  Patient presents with   Shortness of Breath    [name] is a 88 y.o. male.  Patient complains of shortness of breath and weakness.  Patient has a history of myelodysplastic syndrome and COPD.  The history is provided by the patient and medical records. No language interpreter was used.  Shortness of Breath Severity:  Moderate Onset quality:  Sudden Timing:  Constant Progression:  Worsening Chronicity:  New Context: activity   Relieved by:  Nothing Worsened by:  Nothing Ineffective treatments:  None tried Associated symptoms: no abdominal pain, no chest pain, no cough, no headaches and no rash   Risk factors: no recent alcohol use       Home Medications Prior to Admission medications   Medication Sig Start Date End Date Taking? Authorizing Provider  albuterol (VENTOLIN HFA) 108 (90 Base) MCG/ACT inhaler Inhale 2 puffs into the lungs every 4 (four) hours as needed for wheezing or shortness of breath. 04/04/20  Yes Emokpae, Courage, MD  midodrine (PROAMATINE) 2.5 MG tablet Take 1 tablet (2.5 mg total) by mouth 3 (three) times daily with meals. 06/23/21  Yes Derek Jack, MD  penicillin v potassium (VEETID) 500 MG tablet Take 500 mg by mouth every 6 (six) hours. 06/28/21  Yes [provider]  predniSONE (DELTASONE) 5 MG tablet Take 1 tablet (5 mg total) by mouth daily with breakfast. 07/07/21  Yes Derek Jack, MD  silodosin (RAPAFLO) 4 MG CAPS capsule Take 4 mg by mouth at bedtime. 06/27/21  Yes [provider]      Allergies    Patient has no known allergies.    Review of Systems   Review of Systems  Constitutional:  Negative for appetite change and fatigue.  HENT:  Negative for congestion, ear discharge and sinus pressure.   Eyes:  Negative for discharge.  Respiratory:  Positive for shortness of  breath. Negative for cough.   Cardiovascular:  Negative for chest pain.  Gastrointestinal:  Negative for abdominal pain and diarrhea.  Genitourinary:  Negative for frequency and hematuria.  Musculoskeletal:  Negative for back pain.  Skin:  Negative for rash.  Neurological:  Negative for seizures and headaches.  Psychiatric/Behavioral:  Negative for hallucinations.    Physical Exam Updated Vital Signs BP (!) 111/57    Resp (!) 24    Ht 6' (1.829 m)    Wt 53.5 kg    SpO2 (!) 88%    BMI 16.00 kg/m  Physical Exam Vitals and nursing note reviewed.  Constitutional:      Appearance: He is well-developed.  HENT:     Head: Normocephalic.     Nose: Nose normal.  Eyes:     General: No scleral icterus.    Conjunctiva/sclera: Conjunctivae normal.  Neck:     Thyroid: No thyromegaly.  Cardiovascular:     Rate and Rhythm: Normal rate and regular rhythm.     Heart sounds: No murmur heard.   No friction rub. No gallop.  Pulmonary:     Breath sounds: No stridor. No wheezing or rales.     Comments: Tachypnea Chest:     Chest wall: No tenderness.  Abdominal:     General: There is no distension.     Tenderness: There is no abdominal tenderness. There is no rebound.  Genitourinary:    Comments: Heme positive brown stool Musculoskeletal:  General: Normal range of motion.     Cervical back: Neck supple.  Lymphadenopathy:     Cervical: No cervical adenopathy.  Skin:    Findings: No erythema or rash.  Neurological:     Mental Status: He is alert and oriented to person, place, and time.     Motor: No abnormal muscle tone.     Coordination: Coordination normal.  Psychiatric:        Behavior: Behavior normal.    ED Results / Procedures / Treatments   Labs (all labs ordered are listed, but only abnormal results are displayed) Labs Reviewed  CBC WITH DIFFERENTIAL/PLATELET - Abnormal; Notable for the following components:      Result Value   RBC 1.93 (*)    Hemoglobin 5.3 (*)     HCT 16.6 (*)    RDW 19.2 (*)    Platelets 41 (*)    nRBC 2.3 (*)    Abs Immature Granulocytes 0.36 (*)    All other components within normal limits  COMPREHENSIVE METABOLIC PANEL - Abnormal; Notable for the following components:   Potassium 3.4 (*)    Glucose, Bld 102 (*)    BUN 52 (*)    Creatinine, Ser 1.57 (*)    Total Protein 6.1 (*)    Albumin 2.6 (*)    GFR, Estimated 42 (*)    All other components within normal limits  PREPARE RBC (CROSSMATCH)  TYPE AND SCREEN  TROPONIN I (HIGH SENSITIVITY)  TROPONIN I (HIGH SENSITIVITY)    EKG None  Radiology DG Chest Port 1 View  Result Date: 07/12/2021 CLINICAL DATA:  Severe shortness of breath and weakness for 1 day. Near syncopal episode. COPD. EXAM: PORTABLE CHEST 1 VIEW COMPARISON:  06/16/2021 FINDINGS: Left-sided power port remains in appropriate position. Heart size is stable. Marked pulmonary hyperinflation is again demonstrated. Coarse diffuse interstitial infiltrates are unchanged. No evidence of superimposed pulmonary airspace disease or pleural effusion. Lower thoracic spine vertebroplasties again noted. IMPRESSION: Stable COPD and chronic interstitial lung disease. No acute findings. Electronically Signed   By: Marlaine Hind M.D.   On: 07/12/2021 14:16    Procedures Procedures    Medications Ordered in ED Medications  0.9 %  sodium chloride infusion (has no administration in time range)  pantoprazole (PROTONIX) injection 40 mg (has no administration in time range)  sodium chloride 0.9 % bolus 500 mL (500 mLs Intravenous New Bag/Given 07/12/21 1357)  ipratropium-albuterol (DUONEB) 0.5-2.5 (3) MG/3ML nebulizer solution 3 mL (3 mLs Nebulization Given 07/12/21 1408)  albuterol (PROVENTIL) (2.5 MG/3ML) 0.083% nebulizer solution 2.5 mg (2.5 mg Nebulization Given 07/12/21 1411)  methylPREDNISolone sodium succinate (SOLU-MEDROL) 125 mg/2 mL injection 125 mg (125 mg Intravenous Given 07/12/21 1357)  ondansetron (ZOFRAN) injection 4 mg  (4 mg Intravenous Given 07/12/21 1411)    ED Course/ Medical Decision Making/ A&P  CRITICAL CARE Performed by: Milton Ferguson Total critical care time: 45 minutes Critical care time was exclusive of separately billable procedures and treating other patients. Critical care was necessary to treat or prevent imminent or life-threatening deterioration. Critical care was time spent personally by me on the following activities: development of treatment plan with patient and/or surrogate as well as nursing, discussions with consultants, evaluation of patient's response to treatment, examination of patient, obtaining history from patient or surrogate, ordering and performing treatments and interventions, ordering and review of laboratory studies, ordering and review of radiographic studies, pulse oximetry and re-evaluation of patient's condition.   Patient with significant anemia causing weakness.  Rectal exam was heme positive with brown stool patient will be transfused and started on Protonix.  Dr. Laural Golden was contacted and he will see the patient tomorrow.  Patient will be admitted to medicine and transfused                         Medical Decision Making Amount and/or Complexity of Data Reviewed Labs: ordered. Radiology: ordered. ECG/medicine tests: ordered.  Risk Prescription drug management. Decision regarding hospitalization.   Patient with profound anemia and exacerbation of COPD he is being admitted to medicine and being transfused.  GI will see the patient    This patient presents to the ED for concern of shortness of breath, this involves an extensive number of treatment options, and is a complaint that carries with it a high risk of complications and morbidity.  The differential diagnosis includes anemia worsening of COPD   Co morbidities that complicate the patient evaluation  Myelodysplastic syndrome and COPD   Additional history obtained:  Additional history obtained from  patient External records from outside source obtained and reviewed including old record   Lab Tests:  I Ordered, and personally interpreted labs.  The pertinent results include: CBC and chemistries that show significant anemia with hemoglobin of 5.3   Imaging Studies ordered:  I ordered imaging studies including chest x-ray I independently visualized and interpreted imaging which showed COPD I agree with the radiologist interpretation   Cardiac Monitoring:  The patient was maintained on a cardiac monitor.  I personally viewed and interpreted the cardiac monitored which showed an underlying rhythm of: Sinus tach   Medicines ordered and prescription drug management:  I ordered medication including albuterol and Atrovent for COPD and blood for anemia Reevaluation of the patient after these medicines showed that the patient improved I have reviewed the patients home medicines and have made adjustments as needed   Test Considered:  Endoscopy   Critical Interventions:  Transfusion of blood   Consultations Obtained:  I requested consultation with the hospitalist and GI.,  and discussed lab and imaging findings as well as pertinent plan - they recommend: Admission to the hospital and transfusion   Problem List / ED Course:  GI bleed, myelodysplastic syndrome, COPD   Reevaluation:  After the interventions noted above, I reevaluated the patient and found that they have :improved   Social Determinants of Health:  Lives alone   Dispostion:  After consideration of the diagnostic results and the patients response to treatment, I feel that the patent would benefit from admission to the hospital and transfusion and GI consult.         Final Clinical Impression(s) / ED Diagnoses Final diagnoses:  None    Rx / DC Orders ED Discharge Orders     None         Milton Ferguson, MD 07/14/21 0800

## 2021-07-13 DIAGNOSIS — D469 Myelodysplastic syndrome, unspecified: Secondary | ICD-10-CM | POA: Diagnosis not present

## 2021-07-13 DIAGNOSIS — N1832 Chronic kidney disease, stage 3b: Secondary | ICD-10-CM

## 2021-07-13 DIAGNOSIS — R55 Syncope and collapse: Secondary | ICD-10-CM

## 2021-07-13 DIAGNOSIS — R195 Other fecal abnormalities: Secondary | ICD-10-CM | POA: Diagnosis not present

## 2021-07-13 DIAGNOSIS — D696 Thrombocytopenia, unspecified: Secondary | ICD-10-CM

## 2021-07-13 DIAGNOSIS — D649 Anemia, unspecified: Secondary | ICD-10-CM | POA: Diagnosis not present

## 2021-07-13 LAB — BASIC METABOLIC PANEL
Anion gap: 6 (ref 5–15)
BUN: 59 mg/dL — ABNORMAL HIGH (ref 8–23)
CO2: 23 mmol/L (ref 22–32)
Calcium: 8.7 mg/dL — ABNORMAL LOW (ref 8.9–10.3)
Chloride: 107 mmol/L (ref 98–111)
Creatinine, Ser: 1.64 mg/dL — ABNORMAL HIGH (ref 0.61–1.24)
GFR, Estimated: 40 mL/min — ABNORMAL LOW (ref >60)
Glucose, Bld: 110 mg/dL — ABNORMAL HIGH (ref 70–99)
Potassium: 4.3 mmol/L (ref 3.5–5.1)
Sodium: 136 mmol/L (ref 135–145)

## 2021-07-13 LAB — CBC
HCT: 26.3 % — ABNORMAL LOW (ref 39.0–52.0)
Hemoglobin: 8.4 g/dL — ABNORMAL LOW (ref 13.0–17.0)
MCH: 28.2 pg (ref 26.0–34.0)
MCHC: 31.9 g/dL (ref 30.0–36.0)
MCV: 88.3 fL (ref 80.0–100.0)
Platelets: 38 10*3/uL — ABNORMAL LOW (ref 150–400)
RBC: 2.98 MIL/uL — ABNORMAL LOW (ref 4.22–5.81)
RDW: 16.1 % — ABNORMAL HIGH (ref 11.5–15.5)
WBC: 3.8 10*3/uL — ABNORMAL LOW (ref 4.0–10.5)
nRBC: 5.3 % — ABNORMAL HIGH (ref 0.0–0.2)

## 2021-07-13 LAB — PREPARE RBC (CROSSMATCH)

## 2021-07-13 MED ORDER — CHLORHEXIDINE GLUCONATE CLOTH 2 % EX PADS
6.0000 | MEDICATED_PAD | Freq: Every day | CUTANEOUS | Status: DC
  Administered 2021-07-13 – 2021-07-16 (×4): 6 via TOPICAL

## 2021-07-13 NOTE — Progress Notes (Signed)
PROGRESS NOTE  ANA LIAW GYF:[id number] DOB: 1929-10-29 DOA: 07/12/2021 PCP: Sharilyn Sites, MD  Brief History:  88 year old male with a history of chronic respiratory failure on 3 L, COPD, CKD stage III, myelodysplastic syndrome with pancytopenia presenting with a few day history of generalized weakness, and lightheadedness.  The patient had increased his home oxygen up to 4 L.  When EMS was activated, the patient was noted to be hypoxic at 86-90 percent on 6 L.  He was placed on nonrebreather temporarily.  She was also noted to be hypotensive with a blood pressure of 84/53.  He became less responsive during the transfer.  He was given 1 L of fluid with improvement of his responsiveness.  The patient was at his dentist on 07/10/21 when he had a near syncopal episode.  His tooth extraction procedure was cancelled.  He had another near syncopal episode on am 07/12/21 after walking to his kitchen preceded by dizziness.  The patient himself denies any fevers, chills, headache, neck pain, coughing, hemoptysis, abdominal pain, dysuria, hematuria.  He states that he has been having "dark stools" but not frank melena.  He denied hematochezia.  He did have an episode of nausea and vomiting in the emergency department without blood.  He states that he has been feeling dizzy with syncopal and near syncopal episodes for the past 2 to 3 weeks.  He does feel dizzy upon standing.  He denies any focal extremity weakness, visual disturbance, or dysarthria. In the ED, the patient was afebrile but hypotensive initially with a blood pressure 79/61.  He was given an additional bolus of 500 cc and started on maintenance IV fluids.  2 units PRBC were ordered.  WBC was 6.1, hemoglobin 5.3, platelets 41,000.  BMP showed sodium 138, potassium 3.4, bicarbonate 23, serum creatinine 1.57. Notably, the patient has been under the care of med/onc, Dr. Delton Coombes.  The patient was last seen in the oncology clinic 07/07/2021 at  which time the patient was noted to have hemoglobin 6.4.  He was transfused 2 units PRBC at that time.  Review of his medical record shows that the patient has had numerous blood transfusions in the past year secondary to symptomatic anemia.  Recent bone marrow biopsy on 08/04/2020 shows now that he is high risk MDS.  In addition, his most recent bone marrow biopsy on 05/26/2021 shows 6% blasts which was higher than his previous bone marrow's.  The patient has been treated with Azacitidine from 09/16/2020 to 04/21/2021, but the decision was made to hold treatment recently secondary to the patient's recent hospitalization in January for syncope secondary to symptomatic anemia.  He is not on any treatment for his MDS presently.  He has been noted to have low blood pressures, and his midodrine dose was increased to 5 mg 3 times daily on 07/07/2021.  Note has been made of his continued weight loss.       Assessment and Plan: * Acute on chronic anemia- (present on admission) Patient has had numerous PRBC transfusions every 2-4 weeks Now with Symptomatic anemia Appears to more frequent recently  Heme positive stool GI consulted Brown stool noted  Near syncope Due to symptomatic anemia Has had numerous episodes Recent admission 1/30-1/31 for the same Has HHPT He is on chronic midodrine, also for low BPs   Chronic respiratory failure with hypoxia /// 3L/min at baseline- (present on admission) Stable on 3L currently Continue albuterol prn which is  his only listed med for COPD  CKD (chronic kidney disease), stage IIIB- (present on admission) Baseline creatinine 1.7-2.0  Myelodysplastic syndrome ([place])- (present on admission) Follows Dr. Delton Coombes 07/09/21 follow up with Dr. Delton Coombes states treatment of MDS on hold due to hospitalizations for syncope Previously on Azacitidine 09/16/20 to 04/21/21 Add Dr. Delton Coombes to treatment team  COPD (chronic obstructive pulmonary disease) ([place])- (present on  admission) Stable on 3L [place] without worsening dyspnea Continue prn albuterol which is his only listed med  Thrombocytopenia ([place])- (present on admission) Has been chronic due to MDS Continue serial CBC         Status is: Inpatient Remains inpatient appropriate because: due to symptomatic anemia with heme positive stool needing further work up            Family Communication:  no Family at bedside  Consultants:  GI  Code Status:  FULL   DVT Prophylaxis:  SCDs   Procedures: As Listed in Progress Note Above  Antibiotics: None     Subjective: Patient denies fevers, chills, headache, chest pain, dyspnea, nausea, vomiting, diarrhea, abdominal pain, dysuria, hematuria, hematochezia, and melena.   Objective: Vitals:   07/13/21 0500 07/13/21 0505 07/13/21 0711 07/13/21 0800  BP: 127/62 (!) 117/57  113/70  Pulse: 75 77 73 73  Resp: _0 (!) 22  Temp:  97.8 F (36.6 C) 98.1 F (36.7 C)   TempSrc:  Oral Oral   SpO2: 100% 97% 100% 99%  Weight:      Height:        Intake/Output Summary (Last 24 hours) at 07/13/2021 1044 Last data filed at 07/13/2021 0505 Gross per 24 hour  Intake 1123.5 ml  Output 1350 ml  Net -226.5 ml   Weight change:  Exam:  General:  Pt is alert, follows commands appropriately, not in acute distress HEENT: No icterus, No thrush, No neck mass, [employer]/AT Cardiovascular: RRR, S1/S2, no rubs, no gallops Respiratory: bibasilar crackles, R>L Abdomen: Soft/+BS, non tender, non distended, no guarding Extremities: No edema, No lymphangitis, No petechiae, No rashes, no synovitis   Data Reviewed: I have personally reviewed following labs and imaging studies Basic Metabolic Panel: Recent Labs  Lab 07/07/21 0908 07/12/21 1405 07/13/21 0614  NA 136 138 136  K 3.3* 3.4* 4.3  CL 107 110 107  CO2 21* 23 23  GLUCOSE 115* 102* 110*  BUN 39* 52* 59*  CREATININE 1.87* 1.57* 1.64*  CALCIUM 9.4 9.3 8.7*  MG 2.0  --   --    Liver  Function Tests: Recent Labs  Lab 07/07/21 0908 07/12/21 1405  AST 20 18  ALT 13 12  ALKPHOS 61 51  BILITOT 0.7 0.5  PROT 6.4* 6.1*  ALBUMIN 2.8* 2.6*   No results for input(s): LIPASE, AMYLASE in the last 168 hours. No results for input(s): AMMONIA in the last 168 hours. Coagulation Profile: Recent Labs  Lab 07/12/21 1700  INR 1.2   CBC: Recent Labs  Lab 07/07/21 0908 07/12/21 1405 07/13/21 0614  WBC 5.8 6.1 3.8*  NEUTROABS 4.3 4.3  --   HGB 6.4* 5.3* 8.4*  HCT 20.2* 16.6* 26.3*  MCV 83.8 86.0 88.3  PLT 33* 41* 38*   Cardiac Enzymes: No results for input(s): CKTOTAL, CKMB, CKMBINDEX, TROPONINI in the last 168 hours. BNP: Invalid input(s): POCBNP CBG: No results for input(s): GLUCAP in the last 168 hours. HbA1C: No results for input(s): HGBA1C in the last 72 hours. Urine analysis:    Component Value Date/Time  COLORURINE YELLOW 06/16/2021 [place] 06/16/2021 [place] (A) 03/26/2021 1334   LABSPEC 1.025 06/16/2021 0853   PHURINE 6.0 06/16/2021 0853   GLUCOSEU NEGATIVE 06/16/2021 0853   HGBUR TRACE (A) 06/16/2021 0853   BILIRUBINUR NEGATIVE 06/16/2021 0853   BILIRUBINUR Negative 03/26/2021 1334   KETONESUR NEGATIVE 06/16/2021 0853   PROTEINUR TRACE (A) 06/16/2021 0853   UROBILINOGEN 0.2 08/15/2013 1240   NITRITE NEGATIVE 06/16/2021 0853   LEUKOCYTESUR NEGATIVE 06/16/2021 0853   Sepsis Labs: _0 (procalcitonin:4,lacticidven:4) ) Recent Results (from the past 240 hour(s))  Resp Panel by RT-PCR (Flu A&B, Covid) Nasopharyngeal Swab     Status: None   Collection Time: 07/12/21  4:05 PM   Specimen: Nasopharyngeal Swab; Nasopharyngeal(NP) swabs in vial transport medium  Result Value Ref Range Status   SARS Coronavirus 2 by RT PCR NEGATIVE NEGATIVE Final    Comment: (NOTE) SARS-CoV-2 target nucleic acids are NOT DETECTED.  The SARS-CoV-2 RNA is generally detectable in upper respiratory specimens during the acute  phase of infection. The lowest concentration of SARS-CoV-2 viral copies this assay can detect is 138 copies/mL. A negative result does not preclude SARS-Cov-2 infection and should not be used as the sole basis for treatment or other patient management decisions. A negative result may occur with  improper specimen collection/handling, submission of specimen other than nasopharyngeal swab, presence of viral mutation(s) within the areas targeted by this assay, and inadequate number of viral copies(<138 copies/mL). A negative result must be combined with clinical observations, patient history, and epidemiological information. The expected result is Negative.  Fact Sheet for Patients:  EntrepreneurPulse.com.au  Fact Sheet for Healthcare Providers:  IncredibleEmployment.be  This test is no t yet approved or cleared by the [place] FDA and  has been authorized for detection and/or diagnosis of SARS-CoV-2 by FDA under an Emergency Use Authorization (EUA). This EUA will remain  in effect (meaning this test can be used) for the duration of the COVID-19 declaration under Section 564(b)(1) of the Act, 21 U.S.C.section 360bbb-3(b)(1), unless the authorization is terminated  or revoked sooner.       Influenza A by PCR NEGATIVE NEGATIVE Final   Influenza B by PCR NEGATIVE NEGATIVE Final    Comment: (NOTE) The Xpert Xpress SARS-CoV-2/FLU/RSV plus assay is intended as an aid in the diagnosis of influenza from Nasopharyngeal swab specimens and should not be used as a sole basis for treatment. Nasal washings and aspirates are unacceptable for Xpert Xpress SARS-CoV-2/FLU/RSV testing.  Fact Sheet for Patients: EntrepreneurPulse.com.au  Fact Sheet for Healthcare Providers: IncredibleEmployment.be  This test is not yet approved or cleared by the [place] FDA and has been authorized for detection and/or diagnosis of  SARS-CoV-2 by FDA under an Emergency Use Authorization (EUA). This EUA will remain in effect (meaning this test can be used) for the duration of the COVID-19 declaration under Section 564(b)(1) of the Act, 21 U.S.C. section 360bbb-3(b)(1), unless the authorization is terminated or revoked.  Performed at [facility], [address], [place], [place] [address]   MRSA Next Gen by PCR, Nasal     Status: None   Collection Time: 07/12/21  8:00 PM   Specimen: Nasal Mucosa; Nasal Swab  Result Value Ref Range Status   MRSA by PCR Next Gen NOT DETECTED NOT DETECTED Final    Comment: (NOTE) The GeneXpert MRSA Assay (FDA approved for NASAL specimens only), is one component of a comprehensive MRSA colonization surveillance program. It is not intended to diagnose  MRSA infection nor to guide or monitor treatment for MRSA infections. Test performance is not FDA approved in patients less than 66 years old. Performed at [facility], [address], [place], [place] [address]      Scheduled Meds:  Chlorhexidine Gluconate Cloth  6 each Topical Daily   feeding supplement  237 mL Oral BID BM   midodrine  2.5 mg Oral TID WC   pantoprazole (PROTONIX) IV  40 mg Intravenous Once   pantoprazole (PROTONIX) IV  40 mg Intravenous Q12H   penicillin v potassium  500 mg Oral Q6H   predniSONE  5 mg Oral Q breakfast   tamsulosin  0.4 mg Oral QPC supper   Continuous Infusions:  Procedures/Studies: DG Chest Port 1 View  Result Date: 07/12/2021 CLINICAL DATA:  Severe shortness of breath and weakness for 1 day. Near syncopal episode. COPD. EXAM: PORTABLE CHEST 1 VIEW COMPARISON:  06/16/2021 FINDINGS: Left-sided power port remains in appropriate position. Heart size is stable. Marked pulmonary hyperinflation is again demonstrated. Coarse diffuse interstitial infiltrates are unchanged. No evidence of superimposed pulmonary airspace disease or pleural effusion. Lower thoracic spine vertebroplasties again noted.  IMPRESSION: Stable COPD and chronic interstitial lung disease. No acute findings. Electronically Signed   By: Marlaine Hind M.D.   On: 07/12/2021 14:16   DG Chest Port 1 View  Result Date: 06/16/2021 CLINICAL DATA:  An 88 year old male presents with question of sepsis. EXAM: PORTABLE CHEST 1 VIEW COMPARISON:  February 15, 2021 radiographs and CT imaging from that date. FINDINGS: EKG leads project over the chest. LEFT-sided Port-A-Cath terminates at the caval to atrial junction. Cardiomediastinal contours and hilar structures are stable elongated due to hyperinflation that has been present over a series of prior studies. Interstitial thickening which was present previously is is perhaps slightly worse compared to the study of February 15, 2021. No lobar consolidative process.  No visible pneumothorax. Signs of cement augmentation and thoracic lumbar vertebral bodies not well evaluated. IMPRESSION: Chronic interstitial thickening with some worsening may reflect worsening of background interstitial lung disease. The possibility of superimposed infection is not excluded. No signs of lobar consolidation or visible evidence of pleural effusion. Electronically Signed   By: Zetta Bills M.D.   On: 06/16/2021 09:05    Orson Eva, DO  [facility]  If 7PM-7AM, please contact night-coverage [email or web address] Password [email or web address] 07/13/2021, 10:44 AM   LOS: 1 day

## 2021-07-13 NOTE — H&P (View-Only) (Signed)
Referring Provider: Jacqualin Combes, DO Primary Care Physician:  Sharilyn Sites, MD Primary Gastroenterologist:  Dr. Laural Golden  Reason for Consultation:    Occult GI bleed.  HPI:   Patient is 88 year old Caucasian male who has a history of myelo dysplastic syndrome on azacitidine requiring intermittent blood transfusions under care of Dr. Delton Coombes of oncology.  Patient was having some dental issues and was at his dentist office in [facility].  As he was walking towards the exam room he felt lightheaded and had to sit down for a while.  One of the ladies in the office noted him to be very pale.  EMT was called in.  His vital signs were stable.  Patient did not wish to proceed to emergency room.  He was able to drive home by himself.  EMT followed him until he reached his destination.  He says he was preparing his breakfast yesterday morning when he felt lightheaded and had brief syncopal episode when he just slumped on the couch.  He called 911.  He had another syncopal episode while he was in an ambulance. According to nursing staff he did have 3 emesis while in the emergency room.  No hematemesis reported. Patient states lately his appetite has been very good.  He denies chronic nausea or vomiting.  He also denies heartburn or dysphagia.  No history of melena or rectal bleeding.  He also denies hematuria.  He has exertional dyspnea on walking 5 to 6 yards.  He therefore has been able to use scooter at home in order to move around.  He says he does not have dyspnea at rest as long as he is on oxygen. Patient says he has lost close to 30 pounds in the last 1 year. Patient does not take aspirin or other OTC NSAIDs.  He is widowed.  His second wife died in Aug 12, 2009.  He has 2 daughters and 1 son.  One of his daughters lives in [place].  Other children live in [place] in [place].  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 He had 9 siblings.  Only 2 sisters are living.  Older sister is 45 years old and in good health.  He lost brother due to drug addiction at age 63.  Another brother died of pancreatic carcinoma within 1 month of diagnosis.  He was 86 years old.  Another brother died at 40 of natural causes.  4 sisters are diseased.  1 died of auto accident.  Another 1 had seizure disorder starting at birth when she lived to be in early 51s.  1 sister had kidney disease and lived to be 66 to another sister died at 86.    Past Medical History:  Diagnosis Date   Bone cancer ([place])    Chronic kidney insufficiency    Chronic obstructive pulmonary disease    History of GERD.  Patient is not taking any medication and does not have any symptoms at this time.    Pulmonary nodule     Past Surgical History:  Procedure Laterality Date   BACK SURGERY     CATARACT EXTRACTION W/PHACO Right 03/09/2016   Procedure: CATARACT EXTRACTION PHACO AND INTRAOCULAR LENS PLACEMENT RIGHT EYE CDE=8.58;  Surgeon: Tonny Branch, MD;  Location: [facility];  Service: Ophthalmology;  Laterality: Right;  right   CATARACT EXTRACTION W/PHACO Left 04/13/2016   Procedure: CATARACT EXTRACTION PHACO AND  INTRAOCULAR LENS PLACEMENT (IOC);  Surgeon: Tonny Branch, MD;  Location: [facility];  Service: Ophthalmology;  Laterality: Left;  CDE: 8.08   KYPHOPLASTY Bilateral 06/09/2019   Procedure: T11 KYPHOPLASTY;  Surgeon: Consuella Lose, MD;  Location: [facility];  Service: Neurosurgery;  Laterality: Bilateral;   None to Date  10/01/15   PORTACATH PLACEMENT Left 10/11/2020   Procedure: INSERTION PORT-A-CATH (attached catheter in left internal jugular);  Surgeon: Virl Cagey, MD;  Location: [facility];  Service: General;  Laterality: Left;    Prior to Admission medications   Medication Sig Start Date End Date Taking? Authorizing Provider  albuterol (VENTOLIN HFA) 108 (90 Base) MCG/ACT inhaler Inhale 2 puffs into the lungs every 4  (four) hours as needed for wheezing or shortness of breath. 04/04/20  Yes Emokpae, Courage, MD  midodrine (PROAMATINE) 2.5 MG tablet Take 1 tablet (2.5 mg total) by mouth 3 (three) times daily with meals. 06/23/21  Yes Derek Jack, MD  penicillin v potassium (VEETID) 500 MG tablet Take 500 mg by mouth every 6 (six) hours. 06/28/21  Yes [provider]  predniSONE (DELTASONE) 5 MG tablet Take 1 tablet (5 mg total) by mouth daily with breakfast. 07/07/21  Yes Derek Jack, MD  silodosin (RAPAFLO) 4 MG CAPS capsule Take 4 mg by mouth at bedtime. 06/27/21  Yes [provider]    Current Facility-Administered Medications  Medication Dose Route Frequency Provider Last Rate Last Admin   albuterol (VENTOLIN HFA) 108 (90 Base) MCG/ACT inhaler 2 puff  2 puff Inhalation Q4H PRN Stinson, Jacob J, DO       Chlorhexidine Gluconate Cloth 2 % PADS 6 each  6 each Topical Daily Adefeso, Oladapo, DO   6 each at 07/13/21 1130   feeding supplement (ENSURE ENLIVE / ENSURE PLUS) liquid 237 mL  237 mL Oral BID BM Adefeso, Oladapo, DO   237 mL at 07/13/21 1129   midodrine (PROAMATINE) tablet 2.5 mg  2.5 mg Oral TID WC Loma Boston J, DO   2.5 mg at 07/13/21 1130   ondansetron (ZOFRAN) tablet 4 mg  4 mg Oral Q6H PRN Truett Mainland, DO       Or   ondansetron [facility]) injection 4 mg  4 mg Intravenous Q6H PRN Stinson, Jacob J, DO       pantoprazole (PROTONIX) injection 40 mg  40 mg Intravenous Once Stinson, Jacob J, DO       pantoprazole (PROTONIX) injection 40 mg  40 mg Intravenous Q12H Truett Mainland, DO   40 mg at 07/13/21 1129   penicillin v potassium (VEETID) tablet 500 mg  500 mg Oral Q6H Truett Mainland, DO   500 mg at 07/13/21 1130   predniSONE (DELTASONE) tablet 5 mg  5 mg Oral Q breakfast Truett Mainland, DO   5 mg at 07/13/21 0756   tamsulosin (FLOMAX) capsule 0.4 mg  0.4 mg Oral QPC supper Truett Mainland, DO   0.4 mg at 07/12/21 1925   Facility-Administered Medications  Ordered in Other Encounters  Medication Dose Route Frequency Provider Last Rate Last Admin   0.9 %  sodium chloride infusion   Intravenous Continuous Derek Jack, MD   Stopped at 01/06/21 1018   octreotide (SANDOSTATIN LAR) 30 MG IM injection            sodium chloride flush (NS) 0.9 % injection 10 mL  10 mL Intracatheter PRN Derek Jack, MD   10 mL at 01/06/21 1215   sodium chloride flush (  NS) 0.9 % injection 10 mL  10 mL Intravenous PRN Derek Jack, MD   10 mL at 02/17/21 0902   sodium chloride flush (NS) 0.9 % injection 10 mL  10 mL Intravenous PRN Derek Jack, MD   10 mL at 03/10/21 0936    Allergies as of 07/12/2021   (No Known Allergies)    Family History  Problem Relation Age of Onset   Kidney disease Mother    Kidney disease Sister    Colon cancer Neg Hx    Gastric cancer Neg Hx    Esophageal cancer Neg Hx     Social History   Socioeconomic History   Marital status: Widowed    Spouse name: Not on file   Number of children: Not on file   Years of education: Not on file   Highest education level: Not on file  Occupational History   Not on file  Tobacco Use   Smoking status: Former    Years: 15.00    Types: Cigarettes, Cigars    Quit date: 10/01/1990    Years since quitting: 30.8   Smokeless tobacco: Never   Tobacco comments:    Quit x 25-30 years; 2 cigars daily when smoked  Vaping Use   Vaping Use: Never used  Substance and Sexual Activity   Alcohol use: Not Currently    Alcohol/week: 0.0 standard drinks   Drug use: No   Sexual activity: Never    Birth control/protection: None  Other Topics Concern   Not on file  Social History Narrative   Not on file   Social Determinants of Health   Financial Resource Strain: Not on file  Food Insecurity: Not on file  Transportation Needs: Not on file  Physical Activity: Not on file  Stress: Not on file  Social Connections: Not on file  Intimate Partner Violence: Not on file     Review of Systems: See HPI, otherwise normal ROS  Physical Exam: Temp:  [97.4 F (36.3 C)-98.1 F (36.7 C)] 97.8 F (36.6 C) (02/26 1556) Pulse Rate:  [54-105] 70 (02/26 1556) Resp:  [11-27] 17 (02/26 1556) BP: (79-151)/(41-70) 103/48 (02/26 1400) SpO2:  [92 %-100 %] 98 % (02/26 1556) Weight:  [57.3 kg] 57.3 kg (02/25 2000) Last BM Date : 07/11/21  Thin Caucasian male who is in no acute distress. He is on nasal O2. Conjunctiva is pale.  Sclera is nonicteric. Oropharyngeal mucosa is normal. He has complete upper and partial lower denture plates. No neck masses thyromegaly or lymphadenopathy noted. He has Port-A-Cath with entry site at left pectoral region. Cardiac exam with regular rhythm normal S1 and S2.  No murmur or gallop noted. Auscultation lungs reveal vesicular breath sounds bilaterally. Abdomen is symmetrical.  Bowel sounds are normal.  On palpation abdomen is somewhat doughy but nontender.  No organomegaly or masses. Extremities are thin.  No clubbing or edema noted.  Intake/Output from previous day: 02/25 0701 - 02/26 0700 In: 1123.5 [P.O.:100; I.V.:75.5; Blood:948] Out: 9528 [UXLKG:4010] Intake/Output this shift: No intake/output data recorded.  Lab Results: Recent Labs    07/12/21 1405 07/13/21 0614  WBC 6.1 3.8*  HGB 5.3* 8.4*  HCT 16.6* 26.3*  PLT 41* 38*   BMET Recent Labs    07/12/21 1405 07/13/21 0614  NA 138 136  K 3.4* 4.3  CL 110 107  CO2 23 23  GLUCOSE 102* 110*  BUN 52* 59*  CREATININE 1.57* 1.64*  CALCIUM 9.3 8.7*   LFT Recent Labs  07/12/21 1405  PROT 6.1*  ALBUMIN 2.6*  AST 18  ALT 12  ALKPHOS 51  BILITOT 0.5   PT/INR Recent Labs    07/12/21 1700  LABPROT 14.9  INR 1.2   Hepatitis Panel No results for input(s): HEPBSAG, HCVAB, HEPAIGM, HEPBIGM in the last 72 hours.  Studies/Results: DG Chest Port 1 View  Result Date: 07/12/2021 CLINICAL DATA:  Severe shortness of breath and weakness for 1 day. Near  syncopal episode. COPD. EXAM: PORTABLE CHEST 1 VIEW COMPARISON:  06/16/2021 FINDINGS: Left-sided power port remains in appropriate position. Heart size is stable. Marked pulmonary hyperinflation is again demonstrated. Coarse diffuse interstitial infiltrates are unchanged. No evidence of superimposed pulmonary airspace disease or pleural effusion. Lower thoracic spine vertebroplasties again noted. IMPRESSION: Stable COPD and chronic interstitial lung disease. No acute findings. Electronically Signed   By: Marlaine Hind M.D.   On: 07/12/2021 14:16    Assessment;  Patient is very sharp 88 year old Caucasian male with history of myelo dysplastic syndrome on azacitidine, O2 dependent COPD chronic kidney disease who presents with worsening dyspnea weakness syncopal episodes and found to have hemoglobin of 5.3 g.  Patient has received 3 units of PRBCs and hemoglobin is up to 8.4 g.  His hemoglobin 6 days ago was 6.4 g and received 2 units of PRBCs.  Stool guaiac in the emergency room was positive but no history of melena or rectal bleeding. I suspect anemia has primarily secondary to myelodysplastic syndrome.  Contribution by GI bleed does not appear to be significant given that he has not had melena or rectal bleeding.  Dr. Roderic Palau of emergency room noted stool was brown and not black.  Patient is at risk for peptic ulcer disease as well as angiodysplasia given his age.  His platelet count is below 50,000.  Therefore would not recommend tissue biopsy if he were to proceed with esophagogastroduodenoscopy.  Since he is requiring frequent blood transfusion it would be reasonable to consider esophagogastroduodenoscopy. In the meantime we should simply treat him like he has peptic ulcer disease. Patient will be reevaluated in a.m. and final decision made whether or not he should undergo EGD.   Recommendations;  Continue pantoprazole IV at a dose of 40 mg every 12 hours. N.p.o. after midnight except for p.o.  meds. Patient will be reevaluated in the morning and determine if esophagogastroduodenoscopy is feasible.   LOS: 1 day   Patricia Fargo  07/13/2021, 4:34 PM

## 2021-07-13 NOTE — Assessment & Plan Note (Addendum)
Baseline creatinine 1.7-2.0 -Stable

## 2021-07-13 NOTE — Assessment & Plan Note (Addendum)
Patient has had numerous PRBC transfusions every 2-4 weeks Now with Symptomatic anemia Received 5 units total this admission>> most recent 2 units on 2/28 -GI consulted>>EGD done on 2/27 and 2/28 -hematology consult appreciate>>discussed with Dr. Delton Coombes 2/28 EGD-- persistent oozing of blod from bleow the clot (GE junction)..  For hemostasis, one hemostatic clip was successfully placed.  There was no bleeding during the procedure.  Area was successfully injected with 4 mL of a 1:10,000 solution of epinephrine for hemostasis. -full liquid diet and await biopsy results -Hemoglobin 9.5 today

## 2021-07-13 NOTE — TOC Progression Note (Signed)
Transition of Care [facility]) - Progression Note    Patient Details  Name: [name] MRN: [id number] Date of Birth: 26-Jun-1929  Transition of Care [facility]) CM/SW Contact  Salome Arnt, [place] Phone Number: 07/13/2021, 11:11 AM  Clinical Narrative:    Transition of Care [facility]) Screening Note   Patient Details  Name: [name] Date of Birth: 17-Dec-1929   Transition of Care [facility]) CM/SW Contact:    Salome Arnt, LCSW Phone Number: 07/13/2021, 11:11 AM    Transition of Care Department [facility]) has reviewed patient and no TOC needs have been identified at this time. We will continue to monitor patient advancement through interdisciplinary progression rounds. If new patient transition needs arise, please place a TOC consult.        Barriers to Discharge: Continued Medical Work up  Expected Discharge Plan and Services                                                 Social Determinants of Health (SDOH) Interventions    Readmission Risk Interventions No flowsheet data found.

## 2021-07-13 NOTE — Assessment & Plan Note (Addendum)
Follows Dr. Delton Coombes 07/09/21 follow up with Dr. Delton Coombes states treatment of MDS on hold due to hospitalizations for syncope Previously on Azacitidine 09/16/20 to 04/21/21 Appreciate Dr. Delton Coombes consult>>did not feel MDS causing the more rapid drop in Hgb -Hemoglobin remained stable, Follow-up with Dr. Delton Coombes as an outpatient in 1 week

## 2021-07-13 NOTE — Assessment & Plan Note (Addendum)
Due to symptomatic anemia Has had numerous episodes Recent admission 1/30-1/31 for the same Has HHPT He is on chronic midodrine, also for low Bps -Vitals remained stable, no further episodes

## 2021-07-13 NOTE — Assessment & Plan Note (Addendum)
Stable on 3L [place] without worsening dyspnea Continue prn albuterol which is his only listed med Stable

## 2021-07-13 NOTE — Assessment & Plan Note (Addendum)
GI consulted - EGD 2/27 and 2/28 Brown stool noted on rectal exam -see above for plan

## 2021-07-13 NOTE — Consult Note (Signed)
Referring Provider: Jacqualin Combes, DO Primary Care Physician:  Sharilyn Sites, MD Primary Gastroenterologist:  Dr. Laural Golden  Reason for Consultation:    Occult GI bleed.  HPI:   Patient is 88 year old Caucasian male who has a history of myelo dysplastic syndrome on azacitidine requiring intermittent blood transfusions under care of Dr. Delton Coombes of oncology.  Patient was having some dental issues and was at his dentist office in [facility].  As he was walking towards the exam room he felt lightheaded and had to sit down for a while.  One of the ladies in the office noted him to be very pale.  EMT was called in.  His vital signs were stable.  Patient did not wish to proceed to emergency room.  He was able to drive home by himself.  EMT followed him until he reached his destination.  He says he was preparing his breakfast yesterday morning when he felt lightheaded and had brief syncopal episode when he just slumped on the couch.  He called 911.  He had another syncopal episode while he was in an ambulance. According to nursing staff he did have 3 emesis while in the emergency room.  No hematemesis reported. Patient states lately his appetite has been very good.  He denies chronic nausea or vomiting.  He also denies heartburn or dysphagia.  No history of melena or rectal bleeding.  He also denies hematuria.  He has exertional dyspnea on walking 5 to 6 yards.  He therefore has been able to use scooter at home in order to move around.  He says he does not have dyspnea at rest as long as he is on oxygen. Patient says he has lost close to 30 pounds in the last 1 year. Patient does not take aspirin or other OTC NSAIDs.  He is widowed.  His second wife died in 08/10/2009.  He has 2 daughters and 1 son.  One of his daughters lives in [place].  Other children live in [place] in [place].  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 He had 9 siblings.  Only 2 sisters are living.  Older sister is 41 years old and in good health.  He lost brother due to drug addiction at age 18.  Another brother died of pancreatic carcinoma within 1 month of diagnosis.  He was 86 years old.  Another brother died at 48 of natural causes.  4 sisters are diseased.  1 died of auto accident.  Another 1 had seizure disorder starting at birth when she lived to be in early 19s.  1 sister had kidney disease and lived to be 73 to another sister died at 39.    Past Medical History:  Diagnosis Date   Bone cancer ([place])    Chronic kidney insufficiency    Chronic obstructive pulmonary disease    History of GERD.  Patient is not taking any medication and does not have any symptoms at this time.    Pulmonary nodule     Past Surgical History:  Procedure Laterality Date   BACK SURGERY     CATARACT EXTRACTION W/PHACO Right 03/09/2016   Procedure: CATARACT EXTRACTION PHACO AND INTRAOCULAR LENS PLACEMENT RIGHT EYE CDE=8.58;  Surgeon: Tonny Branch, MD;  Location: [facility];  Service: Ophthalmology;  Laterality: Right;  right   CATARACT EXTRACTION W/PHACO Left 04/13/2016   Procedure: CATARACT EXTRACTION PHACO AND  INTRAOCULAR LENS PLACEMENT (IOC);  Surgeon: Tonny Branch, MD;  Location: [facility];  Service: Ophthalmology;  Laterality: Left;  CDE: 8.08   KYPHOPLASTY Bilateral 06/09/2019   Procedure: T11 KYPHOPLASTY;  Surgeon: Consuella Lose, MD;  Location: [place];  Service: Neurosurgery;  Laterality: Bilateral;   None to Date  10/01/15   PORTACATH PLACEMENT Left 10/11/2020   Procedure: INSERTION PORT-A-CATH (attached catheter in left internal jugular);  Surgeon: Virl Cagey, MD;  Location: [facility];  Service: General;  Laterality: Left;    Prior to Admission medications   Medication Sig Start Date End Date Taking? Authorizing Provider  albuterol (VENTOLIN HFA) 108 (90 Base) MCG/ACT inhaler Inhale 2 puffs into the lungs every 4  (four) hours as needed for wheezing or shortness of breath. 04/04/20  Yes Emokpae, Courage, MD  midodrine (PROAMATINE) 2.5 MG tablet Take 1 tablet (2.5 mg total) by mouth 3 (three) times daily with meals. 06/23/21  Yes Derek Jack, MD  penicillin v potassium (VEETID) 500 MG tablet Take 500 mg by mouth every 6 (six) hours. 06/28/21  Yes [provider]  predniSONE (DELTASONE) 5 MG tablet Take 1 tablet (5 mg total) by mouth daily with breakfast. 07/07/21  Yes Derek Jack, MD  silodosin (RAPAFLO) 4 MG CAPS capsule Take 4 mg by mouth at bedtime. 06/27/21  Yes [provider]    Current Facility-Administered Medications  Medication Dose Route Frequency Provider Last Rate Last Admin   albuterol (VENTOLIN HFA) 108 (90 Base) MCG/ACT inhaler 2 puff  2 puff Inhalation Q4H PRN Stinson, Jacob J, DO       Chlorhexidine Gluconate Cloth 2 % PADS 6 each  6 each Topical Daily Adefeso, Oladapo, DO   6 each at 07/13/21 1130   feeding supplement (ENSURE ENLIVE / ENSURE PLUS) liquid 237 mL  237 mL Oral BID BM Adefeso, Oladapo, DO   237 mL at 07/13/21 1129   midodrine (PROAMATINE) tablet 2.5 mg  2.5 mg Oral TID WC Loma Boston J, DO   2.5 mg at 07/13/21 1130   ondansetron (ZOFRAN) tablet 4 mg  4 mg Oral Q6H PRN Truett Mainland, DO       Or   ondansetron [facility]) injection 4 mg  4 mg Intravenous Q6H PRN Stinson, Jacob J, DO       pantoprazole (PROTONIX) injection 40 mg  40 mg Intravenous Once Stinson, Jacob J, DO       pantoprazole (PROTONIX) injection 40 mg  40 mg Intravenous Q12H Truett Mainland, DO   40 mg at 07/13/21 1129   penicillin v potassium (VEETID) tablet 500 mg  500 mg Oral Q6H Truett Mainland, DO   500 mg at 07/13/21 1130   predniSONE (DELTASONE) tablet 5 mg  5 mg Oral Q breakfast Truett Mainland, DO   5 mg at 07/13/21 0756   tamsulosin (FLOMAX) capsule 0.4 mg  0.4 mg Oral QPC supper Truett Mainland, DO   0.4 mg at 07/12/21 1925   Facility-Administered Medications  Ordered in Other Encounters  Medication Dose Route Frequency Provider Last Rate Last Admin   0.9 %  sodium chloride infusion   Intravenous Continuous Derek Jack, MD   Stopped at 01/06/21 1018   octreotide (SANDOSTATIN LAR) 30 MG IM injection            sodium chloride flush (NS) 0.9 % injection 10 mL  10 mL Intracatheter PRN Derek Jack, MD   10 mL at 01/06/21 1215   sodium chloride flush (  NS) 0.9 % injection 10 mL  10 mL Intravenous PRN Derek Jack, MD   10 mL at 02/17/21 0902   sodium chloride flush (NS) 0.9 % injection 10 mL  10 mL Intravenous PRN Derek Jack, MD   10 mL at 03/10/21 0936    Allergies as of 07/12/2021   (No Known Allergies)    Family History  Problem Relation Age of Onset   Kidney disease Mother    Kidney disease Sister    Colon cancer Neg Hx    Gastric cancer Neg Hx    Esophageal cancer Neg Hx     Social History   Socioeconomic History   Marital status: Widowed    Spouse name: Not on file   Number of children: Not on file   Years of education: Not on file   Highest education level: Not on file  Occupational History   Not on file  Tobacco Use   Smoking status: Former    Years: 15.00    Types: Cigarettes, Cigars    Quit date: 10/01/1990    Years since quitting: 30.8   Smokeless tobacco: Never   Tobacco comments:    Quit x 25-30 years; 2 cigars daily when smoked  Vaping Use   Vaping Use: Never used  Substance and Sexual Activity   Alcohol use: Not Currently    Alcohol/week: 0.0 standard drinks   Drug use: No   Sexual activity: Never    Birth control/protection: None  Other Topics Concern   Not on file  Social History Narrative   Not on file   Social Determinants of Health   Financial Resource Strain: Not on file  Food Insecurity: Not on file  Transportation Needs: Not on file  Physical Activity: Not on file  Stress: Not on file  Social Connections: Not on file  Intimate Partner Violence: Not on file     Review of Systems: See HPI, otherwise normal ROS  Physical Exam: Temp:  [97.4 F (36.3 C)-98.1 F (36.7 C)] 97.8 F (36.6 C) (02/26 1556) Pulse Rate:  [54-105] 70 (02/26 1556) Resp:  [11-27] 17 (02/26 1556) BP: (79-151)/(41-70) 103/48 (02/26 1400) SpO2:  [92 %-100 %] 98 % (02/26 1556) Weight:  [57.3 kg] 57.3 kg (02/25 2000) Last BM Date : 07/11/21  Thin Caucasian male who is in no acute distress. He is on nasal O2. Conjunctiva is pale.  Sclera is nonicteric. Oropharyngeal mucosa is normal. He has complete upper and partial lower denture plates. No neck masses thyromegaly or lymphadenopathy noted. He has Port-A-Cath with entry site at left pectoral region. Cardiac exam with regular rhythm normal S1 and S2.  No murmur or gallop noted. Auscultation lungs reveal vesicular breath sounds bilaterally. Abdomen is symmetrical.  Bowel sounds are normal.  On palpation abdomen is somewhat doughy but nontender.  No organomegaly or masses. Extremities are thin.  No clubbing or edema noted.  Intake/Output from previous day: 02/25 0701 - 02/26 0700 In: 1123.5 [P.O.:100; I.V.:75.5; Blood:948] Out: 6734 [LPFXT:0240] Intake/Output this shift: No intake/output data recorded.  Lab Results: Recent Labs    07/12/21 1405 07/13/21 0614  WBC 6.1 3.8*  HGB 5.3* 8.4*  HCT 16.6* 26.3*  PLT 41* 38*   BMET Recent Labs    07/12/21 1405 07/13/21 0614  NA 138 136  K 3.4* 4.3  CL 110 107  CO2 23 23  GLUCOSE 102* 110*  BUN 52* 59*  CREATININE 1.57* 1.64*  CALCIUM 9.3 8.7*   LFT Recent Labs  07/12/21 1405  PROT 6.1*  ALBUMIN 2.6*  AST 18  ALT 12  ALKPHOS 51  BILITOT 0.5   PT/INR Recent Labs    07/12/21 1700  LABPROT 14.9  INR 1.2   Hepatitis Panel No results for input(s): HEPBSAG, HCVAB, HEPAIGM, HEPBIGM in the last 72 hours.  Studies/Results: DG Chest Port 1 View  Result Date: 07/12/2021 CLINICAL DATA:  Severe shortness of breath and weakness for 1 day. Near  syncopal episode. COPD. EXAM: PORTABLE CHEST 1 VIEW COMPARISON:  06/16/2021 FINDINGS: Left-sided power port remains in appropriate position. Heart size is stable. Marked pulmonary hyperinflation is again demonstrated. Coarse diffuse interstitial infiltrates are unchanged. No evidence of superimposed pulmonary airspace disease or pleural effusion. Lower thoracic spine vertebroplasties again noted. IMPRESSION: Stable COPD and chronic interstitial lung disease. No acute findings. Electronically Signed   By: Marlaine Hind M.D.   On: 07/12/2021 14:16    Assessment;  Patient is very sharp 88 year old Caucasian male with history of myelo dysplastic syndrome on azacitidine, O2 dependent COPD chronic kidney disease who presents with worsening dyspnea weakness syncopal episodes and found to have hemoglobin of 5.3 g.  Patient has received 3 units of PRBCs and hemoglobin is up to 8.4 g.  His hemoglobin 6 days ago was 6.4 g and received 2 units of PRBCs.  Stool guaiac in the emergency room was positive but no history of melena or rectal bleeding. I suspect anemia has primarily secondary to myelodysplastic syndrome.  Contribution by GI bleed does not appear to be significant given that he has not had melena or rectal bleeding.  Dr. Roderic Palau of emergency room noted stool was brown and not black.  Patient is at risk for peptic ulcer disease as well as angiodysplasia given his age.  His platelet count is below 50,000.  Therefore would not recommend tissue biopsy if he were to proceed with esophagogastroduodenoscopy.  Since he is requiring frequent blood transfusion it would be reasonable to consider esophagogastroduodenoscopy. In the meantime we should simply treat him like he has peptic ulcer disease. Patient will be reevaluated in a.m. and final decision made whether or not he should undergo EGD.   Recommendations;  Continue pantoprazole IV at a dose of 40 mg every 12 hours. N.p.o. after midnight except for p.o.  meds. Patient will be reevaluated in the morning and determine if esophagogastroduodenoscopy is feasible.   LOS: 1 day   Sedra Morfin  07/13/2021, 4:34 PM

## 2021-07-13 NOTE — Hospital Course (Addendum)
88 year old male with a history of chronic respiratory failure on 3 L, COPD, CKD stage III, myelodysplastic syndrome with pancytopenia presenting with a few day history of generalized weakness, and lightheadedness.  The patient had increased his home oxygen up to 4 L.  When EMS was activated, the patient was noted to be hypoxic at 86-90 percent on 6 L.  He was placed on nonrebreather temporarily.  She was also noted to be hypotensive with a blood pressure of 84/53.  He became less responsive during the transfer.  He was given 1 L of fluid with improvement of his responsiveness.  The patient was at his dentist on 07/10/21 when he had a near syncopal episode.  His tooth extraction procedure was cancelled.  He had another near syncopal episode on am 07/12/21 after walking to his kitchen preceded by dizziness.  The patient himself denies any fevers, chills, headache, neck pain, coughing, hemoptysis, abdominal pain, dysuria, hematuria.  He states that he has been having "dark stools" but not frank melena.  He denied hematochezia.  He did have an episode of nausea and vomiting in the emergency department without blood.  He states that he has been feeling dizzy with syncopal and near syncopal episodes for the past 2 to 3 weeks.  He does feel dizzy upon standing.  He denies any focal extremity weakness, visual disturbance, or dysarthria. In the ED, the patient was afebrile but hypotensive initially with a blood pressure 79/61.  He was given an additional bolus of 500 cc and started on maintenance IV fluids.  2 units PRBC were ordered.  WBC was 6.1, hemoglobin 5.3, platelets 41,000.  BMP showed sodium 138, potassium 3.4, bicarbonate 23, serum creatinine 1.57. Notably, the patient has been under the care of med/onc, Dr. Delton Coombes.  The patient was last seen in the oncology clinic 07/07/2021 at which time the patient was noted to have hemoglobin 6.4.  He was transfused 2 units PRBC at that time.  Review of his medical record  shows that the patient has had numerous blood transfusions in the past year secondary to symptomatic anemia.  Recent bone marrow biopsy on 08/04/2020 shows now that he is high risk MDS.  In addition, his most recent bone marrow biopsy on 05/26/2021 shows 6% blasts which was higher than his previous bone marrow's.  The patient has been treated with Azacitidine from 09/16/2020 to 04/21/2021, but the decision was made to hold treatment recently secondary to the patient's recent hospitalization in January for syncope secondary to symptomatic anemia.  He is not on any treatment for his MDS presently.  He has been noted to have low blood pressures, and his midodrine dose was increased to 5 mg 3 times daily on 07/07/2021.  Note has been made of his continued weight loss.

## 2021-07-13 NOTE — Assessment & Plan Note (Addendum)
Has been chronic due to MDS,  30-40K range Remained stable

## 2021-07-13 NOTE — Assessment & Plan Note (Signed)
Stable on 3L currently Continue albuterol prn which is his only listed med for COPD

## 2021-07-14 ENCOUNTER — Other Ambulatory Visit (HOSPITAL_COMMUNITY): Payer: Medicare Other

## 2021-07-14 ENCOUNTER — Encounter (HOSPITAL_COMMUNITY)
Admission: EM | Disposition: A | Discharge status: Home / Self Care | Payer: Self-pay | Source: Home / Self Care | Attending: Internal Medicine

## 2021-07-14 ENCOUNTER — Encounter (HOSPITAL_COMMUNITY): Payer: Self-pay | Admitting: Hematology

## 2021-07-14 ENCOUNTER — Encounter (HOSPITAL_COMMUNITY): Payer: Medicare Other

## 2021-07-14 ENCOUNTER — Inpatient Hospital Stay (HOSPITAL_COMMUNITY): Payer: Medicare Other | Admitting: Anesthesiology

## 2021-07-14 ENCOUNTER — Encounter (HOSPITAL_COMMUNITY): Payer: Self-pay | Admitting: Family Medicine

## 2021-07-14 ENCOUNTER — Other Ambulatory Visit: Payer: Self-pay

## 2021-07-14 DIAGNOSIS — J9611 Chronic respiratory failure with hypoxia: Secondary | ICD-10-CM

## 2021-07-14 DIAGNOSIS — D649 Anemia, unspecified: Secondary | ICD-10-CM

## 2021-07-14 DIAGNOSIS — K209 Esophagitis, unspecified without bleeding: Secondary | ICD-10-CM

## 2021-07-14 DIAGNOSIS — N1831 Chronic kidney disease, stage 3a: Secondary | ICD-10-CM | POA: Diagnosis not present

## 2021-07-14 DIAGNOSIS — D469 Myelodysplastic syndrome, unspecified: Principal | ICD-10-CM

## 2021-07-14 DIAGNOSIS — E46 Unspecified protein-calorie malnutrition: Secondary | ICD-10-CM | POA: Insufficient documentation

## 2021-07-14 DIAGNOSIS — K449 Diaphragmatic hernia without obstruction or gangrene: Secondary | ICD-10-CM

## 2021-07-14 DIAGNOSIS — D696 Thrombocytopenia, unspecified: Secondary | ICD-10-CM | POA: Diagnosis not present

## 2021-07-14 DIAGNOSIS — D509 Iron deficiency anemia, unspecified: Secondary | ICD-10-CM

## 2021-07-14 DIAGNOSIS — R195 Other fecal abnormalities: Secondary | ICD-10-CM | POA: Diagnosis not present

## 2021-07-14 DIAGNOSIS — Z515 Encounter for palliative care: Secondary | ICD-10-CM

## 2021-07-14 DIAGNOSIS — E440 Moderate protein-calorie malnutrition: Secondary | ICD-10-CM | POA: Insufficient documentation

## 2021-07-14 DIAGNOSIS — K2970 Gastritis, unspecified, without bleeding: Secondary | ICD-10-CM

## 2021-07-14 HISTORY — PX: ESOPHAGOGASTRODUODENOSCOPY (EGD) WITH PROPOFOL: SHX5813

## 2021-07-14 LAB — BASIC METABOLIC PANEL
Anion gap: 5 (ref 5–15)
BUN: 57 mg/dL — ABNORMAL HIGH (ref 8–23)
CO2: 23 mmol/L (ref 22–32)
Calcium: 8.7 mg/dL — ABNORMAL LOW (ref 8.9–10.3)
Chloride: 108 mmol/L (ref 98–111)
Creatinine, Ser: 1.63 mg/dL — ABNORMAL HIGH (ref 0.61–1.24)
GFR, Estimated: 40 mL/min — ABNORMAL LOW (ref >60)
Glucose, Bld: 94 mg/dL (ref 70–99)
Potassium: 3.6 mmol/L (ref 3.5–5.1)
Sodium: 136 mmol/L (ref 135–145)

## 2021-07-14 LAB — CBC
HCT: 22 % — ABNORMAL LOW (ref 39.0–52.0)
Hemoglobin: 7.3 g/dL — ABNORMAL LOW (ref 13.0–17.0)
MCH: 29.4 pg (ref 26.0–34.0)
MCHC: 33.2 g/dL (ref 30.0–36.0)
MCV: 88.7 fL (ref 80.0–100.0)
Platelets: 34 10*3/uL — ABNORMAL LOW (ref 150–400)
RBC: 2.48 MIL/uL — ABNORMAL LOW (ref 4.22–5.81)
RDW: 17.3 % — ABNORMAL HIGH (ref 11.5–15.5)
WBC: 8.3 10*3/uL (ref 4.0–10.5)
nRBC: 2 % — ABNORMAL HIGH (ref 0.0–0.2)

## 2021-07-14 LAB — MAGNESIUM: Magnesium: 1.9 mg/dL (ref 1.7–2.4)

## 2021-07-14 SURGERY — ESOPHAGOGASTRODUODENOSCOPY (EGD) WITH PROPOFOL
Anesthesia: General

## 2021-07-14 MED ORDER — BOOST / RESOURCE BREEZE PO LIQD CUSTOM
1.0000 | Freq: Three times a day (TID) | ORAL | Status: DC
  Administered 2021-07-14 – 2021-07-16 (×3): 1 via ORAL

## 2021-07-14 MED ORDER — LIDOCAINE VISCOUS HCL 2 % MT SOLN
15.0000 mL | Freq: Once | OROMUCOSAL | Status: AC
  Administered 2021-07-14: 15 mL via OROMUCOSAL

## 2021-07-14 MED ORDER — LACTATED RINGERS IV SOLN: INTRAVENOUS | Status: DC

## 2021-07-14 MED ORDER — LIDOCAINE 2% (20 MG/ML) 5 ML SYRINGE
INTRAMUSCULAR | Status: DC | PRN
  Administered 2021-07-14: 50 mg via INTRAVENOUS

## 2021-07-14 MED ORDER — PROPOFOL 10 MG/ML IV BOLUS
INTRAVENOUS | Status: DC | PRN
  Administered 2021-07-14: 40 mg via INTRAVENOUS

## 2021-07-14 MED ORDER — LIDOCAINE VISCOUS HCL 2 % MT SOLN
OROMUCOSAL | Status: AC
  Filled 2021-07-14: qty 15

## 2021-07-14 MED ORDER — PHENYLEPHRINE 40 MCG/ML (10ML) SYRINGE FOR IV PUSH (FOR BLOOD PRESSURE SUPPORT)
PREFILLED_SYRINGE | INTRAVENOUS | Status: DC | PRN
  Administered 2021-07-14: 80 ug via INTRAVENOUS

## 2021-07-14 NOTE — Consult Note (Signed)
[facility] Consultation Oncology  Name: [name]      MRN: [id number]    Location: A327/A327-01  Date: 07/14/2021 Time:6:11 PM   REFERRING PHYSICIAN: Dr. Carles Collet  REASON FOR CONSULT: Acute anemia in the setting of chronic anemia from MDS    HISTORY OF PRESENT ILLNESS: [name] is well-known to me from office visits for his diagnosis of MDS.  We have held his treatment for MDS due to difficulty tolerating treatments.  He is on palliative transfusions as needed.  He did receive 2 units of PRBC on 07/07/2021 with hemoglobin 6.4.  He presented to the ER on 07/12/2021 with hemoglobin 5.3, but denies any hematemesis or melena.  He received 3 units of PRBC in the hospital.  He had a EGD done today which showed small hiatal hernia, esophagitis with no bleeding, gastritis but large amount of food residue in the stomach.  Overall he is feeling better after transfusions.  PAST MEDICAL HISTORY:   Past Medical History:  Diagnosis Date   Bone cancer ([place])    Chronic kidney insufficiency    COPD (chronic obstructive pulmonary disease) ([facility])    GERD (gastroesophageal reflux disease)    Pulmonary nodule     ALLERGIES: No Known Allergies    MEDICATIONS: I have reviewed the patient's current medications.     PAST SURGICAL HISTORY Past Surgical History:  Procedure Laterality Date   BACK SURGERY     CATARACT EXTRACTION W/PHACO Right 03/09/2016   Procedure: CATARACT EXTRACTION PHACO AND INTRAOCULAR LENS PLACEMENT RIGHT EYE CDE=8.58;  Surgeon: Tonny Branch, MD;  [facility];  Service: Ophthalmology;  Laterality: Right;  right   CATARACT EXTRACTION W/PHACO Left 04/13/2016   Procedure: CATARACT EXTRACTION PHACO AND INTRAOCULAR LENS PLACEMENT (IOC);  Surgeon: Tonny Branch, MD;  Location: [facility];  Service: Ophthalmology;  Laterality: Left;  CDE: 8.08   KYPHOPLASTY Bilateral 06/09/2019   Procedure: T11 KYPHOPLASTY;  Surgeon: Consuella Lose, MD;  Location: [place];  Service: Neurosurgery;   Laterality: Bilateral;   None to Date  10/01/15   PORTACATH PLACEMENT Left 10/11/2020   Procedure: INSERTION PORT-A-CATH (attached catheter in left internal jugular);  Surgeon: Virl Cagey, MD;  Location: [facility];  Service: General;  Laterality: Left;    FAMILY HISTORY: Family History  Problem Relation Age of Onset   Kidney disease Mother    Kidney disease Sister    Colon cancer Neg Hx    Gastric cancer Neg Hx    Esophageal cancer Neg Hx     SOCIAL HISTORY:  reports that he quit smoking about 30 years ago. His smoking use included cigarettes and cigars. He has never used smokeless tobacco. He reports that he does not currently use alcohol. He reports that he does not use drugs.  PERFORMANCE STATUS: The patient's performance status is 2 - Symptomatic, <50% confined to bed  PHYSICAL EXAM: Most Recent Vital Signs: Blood pressure (!) 96/48, pulse 72, temperature 97.9 F (36.6 C), temperature source Oral, resp. rate 15, height 6' (1.829 m), weight 126 lb 5.2 oz (57.3 kg), SpO2 94 %. BP (!) 96/48 (BP Location: Left Arm)    Pulse 72    Temp 97.9 F (36.6 C) (Oral)    Resp 15    Ht 6' (1.829 m)    Wt 126 lb 5.2 oz (57.3 kg)    SpO2 94%    BMI 17.13 kg/m  General appearance: alert, cooperative, and appears stated age Lungs: clear to auscultation bilaterally Heart: regular rate  and rhythm Abdomen: soft, non-tender; bowel sounds normal; no masses,  no organomegaly Neurologic: Grossly normal  LABORATORY DATA:  Results for orders placed or performed during the hospital encounter of 07/12/21 (from the past 48 hour(s))  MRSA Next Gen by PCR, Nasal     Status: None   Collection Time: 07/12/21  8:00 PM   Specimen: Nasal Mucosa; Nasal Swab  Result Value Ref Range   MRSA by PCR Next Gen NOT DETECTED NOT DETECTED    Comment: (NOTE) The GeneXpert MRSA Assay (FDA approved for NASAL specimens only), is one component of a comprehensive MRSA colonization surveillance program. It is not  intended to diagnose MRSA infection nor to guide or monitor treatment for MRSA infections. Test performance is not FDA approved in patients less than 11 years old. Performed at [facility], [address], [place], [place] [address]   Basic metabolic panel     Status: Abnormal   Collection Time: 07/13/21  6:14 AM  Result Value Ref Range   Sodium 136 135 - 145 mmol/L   Potassium 4.3 3.5 - 5.1 mmol/L    Comment: DELTA CHECK NOTED   Chloride 107 98 - 111 mmol/L   CO2 23 22 - 32 mmol/L   Glucose, Bld 110 (H) 70 - 99 mg/dL    Comment: Glucose reference range applies only to samples taken after fasting for at least 8 hours.   BUN 59 (H) 8 - 23 mg/dL   Creatinine, Ser 1.64 (H) 0.61 - 1.24 mg/dL   Calcium 8.7 (L) 8.9 - 10.3 mg/dL   GFR, Estimated 40 (L) >60 mL/min    Comment: (NOTE) Calculated using the CKD-EPI Creatinine Equation (2021)    Anion gap 6 5 - 15    Comment: Performed at [facility], [address] [place], [place] [address]  CBC     Status: Abnormal   Collection Time: 07/13/21  6:14 AM  Result Value Ref Range   WBC 3.8 (L) 4.0 - 10.5 K/uL    Comment: REPEATED TO VERIFY WHITE COUNT CONFIRMED ON SMEAR    RBC 2.98 (L) 4.22 - 5.81 MIL/uL   Hemoglobin 8.4 (L) 13.0 - 17.0 g/dL    Comment: REPEATED TO VERIFY POST TRANSFUSION SPECIMEN    HCT 26.3 (L) 39.0 - 52.0 %   MCV 88.3 80.0 - 100.0 fL   MCH 28.2 26.0 - 34.0 pg   MCHC 31.9 30.0 - 36.0 g/dL   RDW 16.1 (H) 11.5 - 15.5 %   Platelets 38 (L) 150 - 400 K/uL    Comment: SPECIMEN CHECKED FOR CLOTS Immature Platelet Fraction may be clinically indicated, consider ordering this additional test [id number] REPEATED TO VERIFY PLATELET COUNT CONFIRMED BY SMEAR    nRBC 5.3 (H) 0.0 - 0.2 %    Comment: REPEATED TO VERIFY Performed at [facility], [address], [place], [place] [address]   Basic metabolic panel     Status: Abnormal   Collection Time: 07/14/21  6:29 AM  Result Value Ref Range   Sodium 136 135 - 145 mmol/L    Potassium 3.6 3.5 - 5.1 mmol/L   Chloride 108 98 - 111 mmol/L   CO2 23 22 - 32 mmol/L   Glucose, Bld 94 70 - 99 mg/dL    Comment: Glucose reference range applies only to samples taken after fasting for at least 8 hours.   BUN 57 (H) 8 - 23 mg/dL   Creatinine, Ser 1.63 (H) 0.61 - 1.24 mg/dL   Calcium 8.7 (  L) 8.9 - 10.3 mg/dL   GFR, Estimated 40 (L) >60 mL/min    Comment: (NOTE) Calculated using the CKD-EPI Creatinine Equation (2021)    Anion gap 5 5 - 15    Comment: Performed at [facility], [address], [place], [place] [address]  CBC     Status: Abnormal   Collection Time: 07/14/21  6:29 AM  Result Value Ref Range   WBC 8.3 4.0 - 10.5 K/uL   RBC 2.48 (L) 4.22 - 5.81 MIL/uL   Hemoglobin 7.3 (L) 13.0 - 17.0 g/dL   HCT 22.0 (L) 39.0 - 52.0 %   MCV 88.7 80.0 - 100.0 fL   MCH 29.4 26.0 - 34.0 pg   MCHC 33.2 30.0 - 36.0 g/dL   RDW 17.3 (H) 11.5 - 15.5 %   Platelets 34 (L) 150 - 400 K/uL    Comment: SPECIMEN CHECKED FOR CLOTS Immature Platelet Fraction may be clinically indicated, consider ordering this additional test [id number] CONSISTENT WITH PREVIOUS RESULT    nRBC 2.0 (H) 0.0 - 0.2 %    Comment: Performed at [facility], [address] [place], [place] [address]  Magnesium     Status: None   Collection Time: 07/14/21  6:29 AM  Result Value Ref Range   Magnesium 1.9 1.7 - 2.4 mg/dL    Comment: Performed at [facility], [address] [place], [place] [address]      RADIOGRAPHY: No results found.       ASSESSMENT and PLAN:  1.  Acute on chronic anemia from MDS: - He received 2 units of PRBC in our office on 07/07/2021 for hemoglobin 6.4. - He presented to the ER on 07/12/2021 with hemoglobin 5.3, platelet count 41 and white count 6.1.  He received 3 units PRBC since hospitalization.  Stool was heme positive. - EGD on 07/14/2021 showed small hiatal hernia, esophagitis, gastritis and a large amount of food in the stomach.  There is a tiny clot at the bottom of his  esophagus.  Stomach and esophagus were incompletely evaluated due to food residue. - Acute drop in hemoglobin is unlikely from MDS.  There is high probability that he might have  bled had a GI bleed. - Agree with repeating EGD tomorrow for better visualization. - We will also order anemia panel tomorrow.  Discussed with Dr. Carles Collet.  All questions were answered. The patient knows to call the clinic with any problems, questions or concerns. We can certainly see the patient much sooner if necessary.   Derek Jack

## 2021-07-14 NOTE — Progress Notes (Signed)
Palliative: Chart review completed.  Attempted to see [name] but he is off the floor for EGD.  PMT to follow-up tomorrow.  Conference with attending, bedside nursing staff, transition of care team related to patient condition, needs.  No charge Thomas Axe, NP Palliative medicine team Team phone [phone] Greater than 50% of this time was spent counseling and coordinating care related to the above assessment and plan.

## 2021-07-14 NOTE — Progress Notes (Signed)
Initial Nutrition Assessment  DOCUMENTATION CODES:   Non-severe (moderate) malnutrition in context of chronic illness, Underweight  INTERVENTION:  - Encourage PO intake - Discontinue Ensure BID - Boost Breeze po TID, each supplement provides 250 kcal and 9 grams of protein  NUTRITION DIAGNOSIS:   Moderate Malnutrition related to chronic illness (COPD, CKD III, multiple myeloma) as evidenced by moderate fat depletion, severe muscle depletion, moderate muscle depletion.  GOAL:   Patient will meet greater than or equal to 90% of their needs  MONITOR:   PO intake, Supplement acceptance, Diet advancement, Labs, Weight trends  REASON FOR ASSESSMENT:   Malnutrition Screening Tool    ASSESSMENT:   Pt admitted from home with SOB, weakness and fatigue secondary to acute on chronic anemia. Followed by oncology, seend 2/20 and found to be anemic to 6.4, received 2 units blood. PMH includes COPD, CKD stage III, GERD, and multiple myeloma.  2/27: upper EGD findings of esophagitis, gastritis and large amount of residue in the stomach; Plans for repeat EGD tomorrow d/t incomplete exam today  Pt resting in bed during visit. He states he is feeling much better today and denies n/v/abdominal pain. Did not provide much history PTA. He is agreeable to [employer] while on a clear liquid diet and Ensure as diet advanced but nothing chocolate flavored.  He endorses a usual weight of ~ 127 lbs and denies recent weight loss. Per review of chart, pt's weights fluctuate from 53.5-56.2 kg within the last 5 months. His current weight noted to be 57.3 kg.  Medications: protonix, veetid, prednisone  Labs: BUN 57, Cr 1.63, hemoglobin 7.3  NUTRITION - FOCUSED PHYSICAL EXAM:  Flowsheet Row Most Recent Value  Orbital Region Moderate depletion  Upper Arm Region Severe depletion  Thoracic and Lumbar Region Moderate depletion  Buccal Region Moderate depletion  Temple Region Moderate depletion  Clavicle  Bone Region Severe depletion  Clavicle and Acromion Bone Region Severe depletion  Scapular Bone Region Moderate depletion  Dorsal Hand Severe depletion  Patellar Region Severe depletion  Anterior Thigh Region Severe depletion  Posterior Calf Region Moderate depletion  Edema (RD Assessment) None  Hair Reviewed  Eyes Unable to assess  [pt resting]  Mouth Reviewed  Skin Reviewed  Nails Reviewed       Diet Order:   Diet Order             Diet NPO time specified  Diet effective midnight           Diet clear liquid Room service appropriate? Yes; Fluid consistency: Thin  Diet effective now                   EDUCATION NEEDS:   No education needs have been identified at this time  Skin:  Skin Assessment: Reviewed RN Assessment  Last BM:  2/24  Height:   Ht Readings from Last 1 Encounters:  07/12/21 6' (1.829 m)    Weight:   Wt Readings from Last 1 Encounters:  07/12/21 57.3 kg   BMI:  Body mass index is 17.13 kg/m.  Estimated Nutritional Needs:   Kcal:  1700-1900  Protein:  85-100g  Fluid:  >/=1.7L  Clayborne Dana, RDN, LDN Clinical Nutrition

## 2021-07-14 NOTE — Op Note (Signed)
[facility] Patient Name: [name] Procedure Date: 07/14/2021 11:22 AM MRN: [id number] Date of Birth: 03-16-1930 Attending MD: Elon Alas. Edgar Frisk CSN: [id number] Age: 88 Admit Type: Outpatient Procedure:                Upper GI endoscopy Indications:              Iron deficiency anemia, Hemoccult positive Providers:                Elon Alas. Abbey Chatters, DO, Lambert Mody, Aram Candela Referring MD:              Medicines:                See the Anesthesia note for documentation of the                            administered medications Complications:            No immediate complications. Estimated Blood Loss:     Estimated blood loss: none. Procedure:                Pre-Anesthesia Assessment:                           - The anesthesia plan was to use monitored                            anesthesia care (MAC).                           After obtaining informed consent, the endoscope was                            passed under direct vision. Throughout the                            procedure, the patient's blood pressure, pulse, and                            oxygen saturations were monitored continuously. The                            [id number]-H190 ([id number]) scope was introduced through the                            mouth, and advanced to the second part of duodenum.                            The upper GI endoscopy was accomplished without                            difficulty. The patient tolerated the procedure                            well. Scope In: 11:38:16 AM Scope  Out: 11:39:30 AM Total Procedure Duration: 0 hours 1 minute 14 seconds  Findings:      A small hiatal hernia was present.      Esophagitis with no active bleeding was found at the gastroesophageal       junction. One small clot in this area. Esophagus incompleteley evaluated       due to food residue.      Diffuse moderate inflammation characterized by erosions and  erythema was       found in the entire examined stomach.      A large amount of food (residue) was found in the gastric body. Impression:               - Small hiatal hernia.                           - Esophagitis with no bleeding.                           - Gastritis.                           - A large amount of food (residue) in the stomach.                           - No specimens collected. Moderate Sedation:      Per Anesthesia Care Recommendation:           - Return patient to hospital ward for ongoing care.                           - Clear liquid diet.                           - Significant esophagitis, gastritis.. There is a                            tiny clot at the bottom of his esophagus. However,                            stomach was full of food. Esophagus incompleteley                            evaluated due to food residue as well. Would                            recommend IV PPI. Clear liquids today. Consider                            repeat EGD tomorrow given incomplete exam today. Procedure Code(s):        --- Professional ---                           [phone], Esophagogastroduodenoscopy, flexible,                            transoral; diagnostic, including collection of  specimen(s) by brushing or washing, when performed                            (separate procedure) Diagnosis Code(s):        --- Professional ---                           K44.9, Diaphragmatic hernia without obstruction or                            gangrene                           K20.90, Esophagitis, unspecified without bleeding                           K29.70, Gastritis, unspecified, without bleeding                           D50.9, Iron deficiency anemia, unspecified CPT copyright 2019 American Medical Association. All rights reserved. The codes documented in this report are preliminary and upon coder review may  be revised to meet current compliance  requirements. Elon Alas. Abbey Chatters, DO Col[place] Abbey Chatters, DO 07/14/2021 11:45:53 AM This report has been signed electronically. Number of Addenda: 0

## 2021-07-14 NOTE — Progress Notes (Signed)
PROGRESS NOTE  [name] EYC:[id number] DOB: 07-28-1929 DOA: 07/12/2021 PCP: Sharilyn Sites, MD  Brief History:  88 year old male with a history of chronic respiratory failure on 3 L, COPD, CKD stage III, myelodysplastic syndrome with pancytopenia presenting with a few day history of generalized weakness, and lightheadedness.  The patient had increased his home oxygen up to 4 L.  When EMS was activated, the patient was noted to be hypoxic at 86-90 percent on 6 L.  He was placed on nonrebreather temporarily.  She was also noted to be hypotensive with a blood pressure of 84/53.  He became less responsive during the transfer.  He was given 1 L of fluid with improvement of his responsiveness.  The patient was at his dentist on 07/10/21 when he had a near syncopal episode.  His tooth extraction procedure was cancelled.  He had another near syncopal episode on am 07/12/21 after walking to his kitchen preceded by dizziness.  The patient himself denies any fevers, chills, headache, neck pain, coughing, hemoptysis, abdominal pain, dysuria, hematuria.  He states that he has been having "dark stools" but not frank melena.  He denied hematochezia.  He did have an episode of nausea and vomiting in the emergency department without blood.  He states that he has been feeling dizzy with syncopal and near syncopal episodes for the past 2 to 3 weeks.  He does feel dizzy upon standing.  He denies any focal extremity weakness, visual disturbance, or dysarthria. In the ED, the patient was afebrile but hypotensive initially with a blood pressure 79/61.  He was given an additional bolus of 500 cc and started on maintenance IV fluids.  2 units PRBC were ordered.  WBC was 6.1, hemoglobin 5.3, platelets 41,000.  BMP showed sodium 138, potassium 3.4, bicarbonate 23, serum creatinine 1.57. Notably, the patient has been under the care of med/onc, Dr. Delton Coombes.  The patient was last seen in the oncology clinic 07/07/2021 at  which time the patient was noted to have hemoglobin 6.4.  He was transfused 2 units PRBC at that time.  Review of his medical record shows that the patient has had numerous blood transfusions in the past year secondary to symptomatic anemia.  Recent bone marrow biopsy on 08/04/2020 shows now that he is high risk MDS.  In addition, his most recent bone marrow biopsy on 05/26/2021 shows 6% blasts which was higher than his previous bone marrow's.  The patient has been treated with Azacitidine from 09/16/2020 to 04/21/2021, but the decision was made to hold treatment recently secondary to the patient's recent hospitalization in January for syncope secondary to symptomatic anemia.  He is not on any treatment for his MDS presently.  He has been noted to have low blood pressures, and his midodrine dose was increased to 5 mg 3 times daily on 07/07/2021.  Note has been made of his continued weight loss.       Assessment and Plan: * Acute on chronic anemia- (present on admission) Patient has had numerous PRBC transfusions every 2-4 weeks Now with Symptomatic anemia Received 3 units this admission>>Hgb dropping again after after transfusion -GI consulted -hematology consult Appears to more frequent recently  Heme positive stool GI consulted -possible EGD 2/27 Brown stool noted  Thrombocytopenia ([place])- (present on admission) Has been chronic due to MDS,  30-40K range Continue serial CBC  Near syncope Due to symptomatic anemia Has had numerous episodes Recent admission 1/30-1/31 for the  same Has HHPT He is on chronic midodrine, also for low BPs   Chronic respiratory failure with hypoxia /// 3L/min at baseline- (present on admission) Stable on 3L currently Continue albuterol prn which is his only listed med for COPD  CKD (chronic kidney disease), stage IIIB- (present on admission) Baseline creatinine 1.7-2.0  Myelodysplastic syndrome ([place])- (present on admission) Follows Dr. Delton Coombes 07/09/21  follow up with Dr. Delton Coombes states treatment of MDS on hold due to hospitalizations for syncope Previously on Azacitidine 09/16/20 to 04/21/21 Add Dr. Delton Coombes to treatment team  COPD (chronic obstructive pulmonary disease) ([place])- (present on admission) Stable on 3L [place] without worsening dyspnea Continue prn albuterol which is his only listed med     Status is: Inpatient Remains inpatient appropriate because: due to symptomatic anemia with heme positive stool needing further work up;  Hgb continues to drop after transfusion                       Family Communication:  daughter ([place]) updated 2/27 Consultants:  GI   Code Status:  FULL    DVT Prophylaxis:  SCDs     Procedures: As Listed in Progress Note Above   Antibiotics: None            Subjective: Patient denies fevers, chills, headache, chest pain, dyspnea, nausea, vomiting, diarrhea, abdominal pain, dysuria, hematuria, hematochezia, and melena.   Objective: Vitals:   07/13/21 2200 07/13/21 2300 07/14/21 0000 07/14/21 0700  BP: (!) 97/43 (!) 100/44  (!) 113/54  Pulse: 72 71  79  Resp: '16 17  17  ' Temp:   (P) 97.8 F (36.6 C) 98.5 F (36.9 C)  TempSrc:   (P) Oral Oral  SpO2: 96% 97%  94%  Weight:      Height:        Intake/Output Summary (Last 24 hours) at 07/14/2021 0757 Last data filed at 07/13/2021 1700 Gross per 24 hour  Intake --  Output 950 ml  Net -950 ml   Weight change:  Exam:  General:  Pt is alert, follows commands appropriately, not in acute distress HEENT: No icterus, No thrush, No neck mass, [place]/AT Cardiovascular: RRR, S1/S2, no rubs, no gallops Respiratory: diminished BS.  Bibasilar rales.  No wheeze Abdomen: Soft/+BS, non tender, non distended, no guarding Extremities: No edema, No lymphangitis, No petechiae, No rashes, no synovitis   Data Reviewed: I have personally reviewed following labs and imaging studies Basic Metabolic Panel: Recent Labs  Lab  07/07/21 0908 07/12/21 1405 07/13/21 0614 07/14/21 0629  NA 136 138 136 136  K 3.3* 3.4* 4.3 3.6  CL 107 110 107 108  CO2 21* '23 23 23  ' GLUCOSE 115* 102* 110* 94  BUN 39* 52* 59* 57*  CREATININE 1.87* 1.57* 1.64* 1.63*  CALCIUM 9.4 9.3 8.7* 8.7*  MG 2.0  --   --  1.9   Liver Function Tests: Recent Labs  Lab 07/07/21 0908 07/12/21 1405  AST 20 18  ALT 13 12  ALKPHOS 61 51  BILITOT 0.7 0.5  PROT 6.4* 6.1*  ALBUMIN 2.8* 2.6*   No results for input(s): LIPASE, AMYLASE in the last 168 hours. No results for input(s): AMMONIA in the last 168 hours. Coagulation Profile: Recent Labs  Lab 07/12/21 1700  INR 1.2   CBC: Recent Labs  Lab 07/07/21 0908 07/12/21 1405 07/13/21 0614 07/14/21 0629  WBC 5.8 6.1 3.8* 8.3  NEUTROABS 4.3 4.3  --   --   HGB 6.4*  5.3* 8.4* 7.3*  HCT 20.2* 16.6* 26.3* 22.0*  MCV 83.8 86.0 88.3 88.7  PLT 33* 41* 38* 34*   Cardiac Enzymes: No results for input(s): CKTOTAL, CKMB, CKMBINDEX, TROPONINI in the last 168 hours. BNP: Invalid input(s): POCBNP CBG: No results for input(s): GLUCAP in the last 168 hours. HbA1C: No results for input(s): HGBA1C in the last 72 hours. Urine analysis:    Component Value Date/Time   COLORURINE YELLOW 06/16/2021 0853   APPEARANCEUR CLEAR 06/16/2021 0853   APPEARANCEUR Hazy (A) 03/26/2021 1334   LABSPEC 1.025 06/16/2021 0853   PHURINE 6.0 06/16/2021 0853   GLUCOSEU NEGATIVE 06/16/2021 0853   HGBUR TRACE (A) 06/16/2021 0853   BILIRUBINUR NEGATIVE 06/16/2021 0853   BILIRUBINUR Negative 03/26/2021 1334   KETONESUR NEGATIVE 06/16/2021 0853   PROTEINUR TRACE (A) 06/16/2021 0853   UROBILINOGEN 0.2 08/15/2013 1240   NITRITE NEGATIVE 06/16/2021 0853   LEUKOCYTESUR NEGATIVE 06/16/2021 0853   Sepsis Labs: '@LABRCNTIP' (procalcitonin:4,lacticidven:4) ) Recent Results (from the past 240 hour(s))  Resp Panel by RT-PCR (Flu A&B, Covid) Nasopharyngeal Swab     Status: None   Collection Time: 07/12/21  4:05 PM    Specimen: Nasopharyngeal Swab; Nasopharyngeal(NP) swabs in vial transport medium  Result Value Ref Range Status   SARS Coronavirus 2 by RT PCR NEGATIVE NEGATIVE Final    Comment: (NOTE) SARS-CoV-2 target nucleic acids are NOT DETECTED.  The SARS-CoV-2 RNA is generally detectable in upper respiratory specimens during the acute phase of infection. The lowest concentration of SARS-CoV-2 viral copies this assay can detect is 138 copies/mL. A negative result does not preclude SARS-Cov-2 infection and should not be used as the sole basis for treatment or other patient management decisions. A negative result may occur with  improper specimen collection/handling, submission of specimen other than nasopharyngeal swab, presence of viral mutation(s) within the areas targeted by this assay, and inadequate number of viral copies(<138 copies/mL). A negative result must be combined with clinical observations, patient history, and epidemiological information. The expected result is Negative.  Fact Sheet for Patients:  EntrepreneurPulse.com.au  Fact Sheet for Healthcare Providers:  IncredibleEmployment.be  This test is no t yet approved or cleared by the [place] FDA and  has been authorized for detection and/or diagnosis of SARS-CoV-2 by FDA under an Emergency Use Authorization (EUA). This EUA will remain  in effect (meaning this test can be used) for the duration of the COVID-19 declaration under Section 564(b)(1) of the Act, 21 U.S.C.section 360bbb-3(b)(1), unless the authorization is terminated  or revoked sooner.       Influenza A by PCR NEGATIVE NEGATIVE Final   Influenza B by PCR NEGATIVE NEGATIVE Final    Comment: (NOTE) The Xpert Xpress SARS-CoV-2/FLU/RSV plus assay is intended as an aid in the diagnosis of influenza from Nasopharyngeal swab specimens and should not be used as a sole basis for treatment. Nasal washings and aspirates are  unacceptable for Xpert Xpress SARS-CoV-2/FLU/RSV testing.  Fact Sheet for Patients: EntrepreneurPulse.com.au  Fact Sheet for Healthcare Providers: IncredibleEmployment.be  This test is not yet approved or cleared by the [place] FDA and has been authorized for detection and/or diagnosis of SARS-CoV-2 by FDA under an Emergency Use Authorization (EUA). This EUA will remain in effect (meaning this test can be used) for the duration of the COVID-19 declaration under Section 564(b)(1) of the Act, 21 U.S.C. section 360bbb-3(b)(1), unless the authorization is terminated or revoked.  Performed at [facility], [address] [place], [place] [address]   MRSA Next  Gen by PCR, Nasal     Status: None   Collection Time: 07/12/21  8:00 PM   Specimen: Nasal Mucosa; Nasal Swab  Result Value Ref Range Status   MRSA by PCR Next Gen NOT DETECTED NOT DETECTED Final    Comment: (NOTE) The GeneXpert MRSA Assay (FDA approved for NASAL specimens only), is one component of a comprehensive MRSA colonization surveillance program. It is not intended to diagnose MRSA infection nor to guide or monitor treatment for MRSA infections. Test performance is not FDA approved in patients less than 35 years old. Performed at [facility], [address] [place], [place] [address]      Scheduled Meds:  Chlorhexidine Gluconate Cloth  6 each Topical Daily   feeding supplement  237 mL Oral BID BM   midodrine  2.5 mg Oral TID WC   pantoprazole (PROTONIX) IV  40 mg Intravenous Once   pantoprazole (PROTONIX) IV  40 mg Intravenous Q12H   penicillin v potassium  500 mg Oral Q6H   predniSONE  5 mg Oral Q breakfast   tamsulosin  0.4 mg Oral QPC supper   Continuous Infusions:  Procedures/Studies: DG Chest Port 1 View  Result Date: 07/12/2021 CLINICAL DATA:  Severe shortness of breath and weakness for 1 day. Near syncopal episode. COPD. EXAM: PORTABLE CHEST 1 VIEW COMPARISON:   06/16/2021 FINDINGS: Left-sided power port remains in appropriate position. Heart size is stable. Marked pulmonary hyperinflation is again demonstrated. Coarse diffuse interstitial infiltrates are unchanged. No evidence of superimposed pulmonary airspace disease or pleural effusion. Lower thoracic spine vertebroplasties again noted. IMPRESSION: Stable COPD and chronic interstitial lung disease. No acute findings. Electronically Signed   By: Marlaine Hind M.D.   On: 07/12/2021 14:16   DG Chest Port 1 View  Result Date: 06/16/2021 CLINICAL DATA:  An 88 year old male presents with question of sepsis. EXAM: PORTABLE CHEST 1 VIEW COMPARISON:  February 15, 2021 radiographs and CT imaging from that date. FINDINGS: EKG leads project over the chest. LEFT-sided Port-A-Cath terminates at the caval to atrial junction. Cardiomediastinal contours and hilar structures are stable elongated due to hyperinflation that has been present over a series of prior studies. Interstitial thickening which was present previously is is perhaps slightly worse compared to the study of February 15, 2021. No lobar consolidative process.  No visible pneumothorax. Signs of cement augmentation and thoracic lumbar vertebral bodies not well evaluated. IMPRESSION: Chronic interstitial thickening with some worsening may reflect worsening of background interstitial lung disease. The possibility of superimposed infection is not excluded. No signs of lobar consolidation or visible evidence of pleural effusion. Electronically Signed   By: Zetta Bills M.D.   On: 06/16/2021 09:05    Orson Eva, DO  [facility]  If 7PM-7AM, please contact night-coverage [email or web address] Password [facility] 07/14/2021, 7:57 AM   LOS: 2 days

## 2021-07-14 NOTE — Interval H&P Note (Signed)
History and Physical Interval Note:  07/14/2021 11:13 AM  [name]  has presented today for surgery, with the diagnosis of anemia with heme positive stool.  The various methods of treatment have been discussed with the patient and family. After consideration of risks, benefits and other options for treatment, the patient has consented to  Procedure(s): ESOPHAGOGASTRODUODENOSCOPY (EGD) WITH PROPOFOL (N/A) as a surgical intervention.  The patient's history has been reviewed, patient examined, no change in status, stable for surgery.  I have reviewed the patient's chart and labs.  Questions were answered to the patient's satisfaction.     Eloise Harman

## 2021-07-14 NOTE — Transfer of Care (Signed)
Immediate Anesthesia Transfer of Care Note  Patient: [name]  Procedure(s) Performed: ESOPHAGOGASTRODUODENOSCOPY (EGD) WITH PROPOFOL  Patient Location: PACU  Anesthesia Type:MAC  Level of Consciousness: sedated, patient cooperative and responds to stimulation  Airway & Oxygen Therapy: Patient Spontanous Breathing and Patient connected to nasal cannula oxygen  Post-op Assessment: Report given to RN, Post -op Vital signs reviewed and stable and Patient moving all extremities  Post vital signs: Reviewed and stable  Last Vitals:  Vitals Value Taken Time  BP    Temp    Pulse    Resp    SpO2      Last Pain:  Vitals:   07/14/21 1137  TempSrc:   PainSc: 0-No pain         Complications: No notable events documented.

## 2021-07-14 NOTE — Discharge Planning (Signed)
Oncology Discharge Planning Admission Note  [place] [facility] Address: [address], [place] [address] Hours of Operation:  8am - 5pm, Monday - Friday  Clinic Contact Information:  [phone]  Oncology Care Team: Medical Oncologist:  Dr. Derek Jack  Oncology provider Dr. Ludwig Lean had been made aware of this hospital admission dated 07/12/21 and will assess patient at bedside. The cancer center will follow [name] inpatient care to assist with discharge planning as indicated by the oncologist.  We will arrange for hospital follow up closer to discharge.  Disclaimer:  This [place] note does not imply a formal consult request has been made by the admitting attending for this admission or there will be an inpatient consult completed by oncology.  Please request oncology consults as per standard process as indicated.

## 2021-07-14 NOTE — Progress Notes (Signed)
Patient transferred from ICU but brought from short stay after EGD which found esophagitis and esophagitis as well as undigested food.  Patient alert and oriented asking for food and drink   Received order for clear liquid diet and now eating jello and drinking juice.  Vitals stable , bp  112/85 pulse 81

## 2021-07-14 NOTE — Anesthesia Preprocedure Evaluation (Signed)
Anesthesia Evaluation  Patient identified by MRN, date of birth, ID band Patient awake    Reviewed: Allergy & Precautions, NPO status , Patient's Chart, lab work & pertinent test results  Airway Mallampati: II  TM Distance: <3 FB Neck ROM: Full    Dental  (+) Dental Advisory Given, Edentulous Upper, Partial Lower   Pulmonary pneumonia, COPD,  COPD inhaler and oxygen dependent, former smoker,    Pulmonary exam normal breath sounds clear to auscultation       Cardiovascular Exercise Tolerance: Poor Normal cardiovascular exam Rhythm:Regular Rate:Normal     Neuro/Psych PSYCHIATRIC DISORDERS Depression negative neurological ROS  negative psych ROS   GI/Hepatic Neg liver ROS, GERD  Medicated,  Endo/Other  negative endocrine ROS  Renal/GU Renal InsufficiencyRenal disease  negative genitourinary   Musculoskeletal negative musculoskeletal ROS (+)   Abdominal   Peds negative pediatric ROS (+)  Hematology  (+) Blood dyscrasia (myelodysplastic syndrome, thrombocytopenia - 34), anemia ,   Anesthesia Other Findings Low back pain  Reproductive/Obstetrics negative OB ROS                             Anesthesia Physical  Anesthesia Plan  ASA: 4 and emergent  Anesthesia Plan: General   Post-op Pain Management: Minimal or no pain anticipated   Induction: Intravenous  PONV Risk Score and Plan: 3 and TIVA  Airway Management Planned: Nasal Cannula and Natural Airway  Additional Equipment:   Intra-op Plan:   Post-operative Plan:   Informed Consent: I have reviewed the patients History and Physical, chart, labs and discussed the procedure including the risks, benefits and alternatives for the proposed anesthesia with the patient or authorized representative who has indicated his/her understanding and acceptance.     Dental advisory given  Plan Discussed with: CRNA and Surgeon  Anesthesia Plan  Comments:         Anesthesia Quick Evaluation

## 2021-07-14 NOTE — Progress Notes (Signed)
° ° °  Subjective: Denies abdominal pain. No N/V. No overt GI bleeding. Chronic fatigue. States he is sore "all over" from being in the bed. Mouth is dry and wants to eat when he can.   Objective: Vital signs in last 24 hours: Temp:  [97.6 F (36.4 C)-98.5 F (36.9 C)] 98.5 F (36.9 C) (02/27 0700) Pulse Rate:  [64-84] 73 (02/27 0800) Resp:  [11-19] 17 (02/27 0800) BP: (95-129)/(41-65) 106/43 (02/27 0800) SpO2:  [94 %-100 %] 96 % (02/27 0800) Last BM Date : 07/11/21 General:   Alert and oriented, pleasant, frail  Head:  Normocephalic and atraumatic. Abdomen:  Bowel sounds present, soft, non-tender, non-distended. No HSM or hernias noted. No rebound or guarding. No masses appreciated  Extremities:  Without edema. Neurologic:  Alert and  oriented x4  Intake/Output from previous day: 02/26 0701 - 02/27 0700 In: -  Out: 950 [Urine:950] Intake/Output this shift: No intake/output data recorded.  Lab Results: Recent Labs    07/12/21 1405 07/13/21 0614 07/14/21 0629  WBC 6.1 3.8* 8.3  HGB 5.3* 8.4* 7.3*  HCT 16.6* 26.3* 22.0*  PLT 41* 38* 34*   BMET Recent Labs    07/12/21 1405 07/13/21 0614 07/14/21 0629  NA 138 136 136  K 3.4* 4.3 3.6  CL 110 107 108  CO2 23 23 23   GLUCOSE 102* 110* 94  BUN 52* 59* 57*  CREATININE 1.57* 1.64* 1.63*  CALCIUM 9.3 8.7* 8.7*   LFT Recent Labs    07/12/21 1405  PROT 6.1*  ALBUMIN 2.6*  AST 18  ALT 12  ALKPHOS 51  BILITOT 0.5   PT/INR Recent Labs    07/12/21 1700  LABPROT 14.9  INR 1.2    Studies/Results: DG Chest Port 1 View  Result Date: 07/12/2021 CLINICAL DATA:  Severe shortness of breath and weakness for 1 day. Near syncopal episode. COPD. EXAM: PORTABLE CHEST 1 VIEW COMPARISON:  06/16/2021 FINDINGS: Left-sided power port remains in appropriate position. Heart size is stable. Marked pulmonary hyperinflation is again demonstrated. Coarse diffuse interstitial infiltrates are unchanged. No evidence of superimposed  pulmonary airspace disease or pleural effusion. Lower thoracic spine vertebroplasties again noted. IMPRESSION: Stable COPD and chronic interstitial lung disease. No acute findings. Electronically Signed   By: Marlaine Hind M.D.   On: 07/12/2021 14:16    Assessment: 88 year old male with history of MDS, COPD on 3 liters O2 via nasal cannula as outpatient, CKD, presenting with profound anemia and Hgb 5.3, heme positive stool but without overt GI bleeding, and received total of 3 units PRBCs this admission.   Profound anemia: Hgb 7.3 this morning, down from 8.4 yesterday. Remains without overt bleeding. Chronic pancytopenia in setting of MDS. Platelets near baseline and today 34. Suspect anemia primarily related to MDS but will pursue EGD to assess for occult GI contribution.   Currently on 3 liters nasal cannula O2, which is his baseline as outpatient. Respiratory status at baseline.  EGD today with Dr. Abbey Chatters to be arranged. Patient currently NPO.    Plan: Remain NPO EGD with Dr. Abbey Chatters today. Discussed risks and benefits with stated understanding. Continue Protonix IV BID   Annitta Needs, PhD, ANP-BC [facility]    LOS: 2 days    07/14/2021, 9:07 AM

## 2021-07-14 NOTE — Anesthesia Postprocedure Evaluation (Signed)
Anesthesia Post Note  Patient: [name]  Procedure(s) Performed: ESOPHAGOGASTRODUODENOSCOPY (EGD) WITH PROPOFOL  Patient location during evaluation: Phase II Anesthesia Type: General Level of consciousness: awake and alert and oriented Pain management: pain level controlled Vital Signs Assessment: post-procedure vital signs reviewed and stable Respiratory status: spontaneous breathing, nonlabored ventilation and respiratory function stable Cardiovascular status: blood pressure returned to baseline and stable Postop Assessment: no apparent nausea or vomiting Anesthetic complications: no   No notable events documented.   Last Vitals:  Vitals:   07/14/21 1200 07/14/21 1215  BP: (!) 99/52 104/60  Pulse: 79 73  Resp: 15   Temp:    SpO2: 100% 100%    Last Pain:  Vitals:   07/14/21 1220  TempSrc:   PainSc: 0-No pain                 Mica Ramdass C Lorre Opdahl

## 2021-07-15 ENCOUNTER — Encounter (HOSPITAL_COMMUNITY)
Admission: EM | Disposition: A | Discharge status: Home / Self Care | Payer: Self-pay | Source: Home / Self Care | Attending: Internal Medicine

## 2021-07-15 ENCOUNTER — Inpatient Hospital Stay (HOSPITAL_COMMUNITY): Payer: Medicare Other | Admitting: Anesthesiology

## 2021-07-15 DIAGNOSIS — K2289 Other specified disease of esophagus: Secondary | ICD-10-CM | POA: Diagnosis not present

## 2021-07-15 DIAGNOSIS — Z7189 Other specified counseling: Secondary | ICD-10-CM

## 2021-07-15 DIAGNOSIS — J9611 Chronic respiratory failure with hypoxia: Secondary | ICD-10-CM | POA: Diagnosis not present

## 2021-07-15 DIAGNOSIS — D509 Iron deficiency anemia, unspecified: Secondary | ICD-10-CM

## 2021-07-15 DIAGNOSIS — J449 Chronic obstructive pulmonary disease, unspecified: Secondary | ICD-10-CM

## 2021-07-15 DIAGNOSIS — K921 Melena: Secondary | ICD-10-CM | POA: Diagnosis not present

## 2021-07-15 DIAGNOSIS — K2971 Gastritis, unspecified, with bleeding: Secondary | ICD-10-CM

## 2021-07-15 DIAGNOSIS — N289 Disorder of kidney and ureter, unspecified: Secondary | ICD-10-CM

## 2021-07-15 DIAGNOSIS — K2970 Gastritis, unspecified, without bleeding: Secondary | ICD-10-CM | POA: Diagnosis not present

## 2021-07-15 DIAGNOSIS — D649 Anemia, unspecified: Secondary | ICD-10-CM | POA: Diagnosis not present

## 2021-07-15 DIAGNOSIS — Z515 Encounter for palliative care: Secondary | ICD-10-CM | POA: Diagnosis not present

## 2021-07-15 DIAGNOSIS — N1832 Chronic kidney disease, stage 3b: Secondary | ICD-10-CM | POA: Diagnosis not present

## 2021-07-15 DIAGNOSIS — D469 Myelodysplastic syndrome, unspecified: Secondary | ICD-10-CM | POA: Diagnosis not present

## 2021-07-15 HISTORY — PX: HEMOSTASIS CLIP PLACEMENT: SHX6857

## 2021-07-15 HISTORY — PX: BIOPSY: SHX5522

## 2021-07-15 HISTORY — PX: ESOPHAGOGASTRODUODENOSCOPY (EGD) WITH PROPOFOL: SHX5813

## 2021-07-15 LAB — FERRITIN: Ferritin: 710 ng/mL — ABNORMAL HIGH (ref 24–336)

## 2021-07-15 LAB — BASIC METABOLIC PANEL
Anion gap: 7 (ref 5–15)
BUN: 48 mg/dL — ABNORMAL HIGH (ref 8–23)
CO2: 22 mmol/L (ref 22–32)
Calcium: 8.5 mg/dL — ABNORMAL LOW (ref 8.9–10.3)
Chloride: 107 mmol/L (ref 98–111)
Creatinine, Ser: 1.48 mg/dL — ABNORMAL HIGH (ref 0.61–1.24)
GFR, Estimated: 45 mL/min — ABNORMAL LOW (ref >60)
Glucose, Bld: 79 mg/dL (ref 70–99)
Potassium: 3.5 mmol/L (ref 3.5–5.1)
Sodium: 136 mmol/L (ref 135–145)

## 2021-07-15 LAB — CBC
HCT: 19.5 % — ABNORMAL LOW (ref 39.0–52.0)
Hemoglobin: 6.4 g/dL — CL (ref 13.0–17.0)
MCH: 29.2 pg (ref 26.0–34.0)
MCHC: 32.8 g/dL (ref 30.0–36.0)
MCV: 89 fL (ref 80.0–100.0)
Platelets: 37 10*3/uL — ABNORMAL LOW (ref 150–400)
RBC: 2.19 MIL/uL — ABNORMAL LOW (ref 4.22–5.81)
RDW: 17.2 % — ABNORMAL HIGH (ref 11.5–15.5)
WBC: 6.8 10*3/uL (ref 4.0–10.5)
nRBC: 2.2 % — ABNORMAL HIGH (ref 0.0–0.2)

## 2021-07-15 LAB — RETIC PANEL
Immature Retic Fract: 28.4 % — ABNORMAL HIGH (ref 2.3–15.9)
RBC.: 2.2 MIL/uL — ABNORMAL LOW (ref 4.22–5.81)
Retic Count, Absolute: 54.3 10*3/uL (ref 19.0–186.0)
Retic Ct Pct: 2.5 % (ref 0.4–3.1)
Reticulocyte Hemoglobin: 18.6 pg — ABNORMAL LOW (ref >27.9)

## 2021-07-15 LAB — PREPARE RBC (CROSSMATCH)

## 2021-07-15 LAB — IRON AND TIBC
Iron: 107 ug/dL (ref 45–182)
Saturation Ratios: 68 % — ABNORMAL HIGH (ref 17.9–39.5)
TIBC: 158 ug/dL — ABNORMAL LOW (ref 250–450)
UIBC: 51 ug/dL

## 2021-07-15 LAB — VITAMIN B12: Vitamin B-12: 1383 pg/mL — ABNORMAL HIGH (ref 180–914)

## 2021-07-15 LAB — HEMOGLOBIN AND HEMATOCRIT, BLOOD
HCT: 29.7 % — ABNORMAL LOW (ref 39.0–52.0)
Hemoglobin: 9.9 g/dL — ABNORMAL LOW (ref 13.0–17.0)

## 2021-07-15 LAB — LACTATE DEHYDROGENASE: LDH: 106 U/L (ref 98–192)

## 2021-07-15 LAB — FOLATE: Folate: 4.5 ng/mL — ABNORMAL LOW (ref >5.9)

## 2021-07-15 SURGERY — ESOPHAGOGASTRODUODENOSCOPY (EGD) WITH PROPOFOL
Anesthesia: General

## 2021-07-15 MED ORDER — PROPOFOL 10 MG/ML IV BOLUS
INTRAVENOUS | Status: DC | PRN
  Administered 2021-07-15: 100 mg via INTRAVENOUS

## 2021-07-15 MED ORDER — SODIUM CHLORIDE 0.9 % IV SOLN: INTRAVENOUS | Status: DC

## 2021-07-15 MED ORDER — SODIUM CHLORIDE (PF) 0.9 % IJ SOLN
PREFILLED_SYRINGE | INTRAMUSCULAR | Status: DC | PRN
  Administered 2021-07-15: 4 mL

## 2021-07-15 MED ORDER — SUCCINYLCHOLINE CHLORIDE 200 MG/10ML IV SOSY
PREFILLED_SYRINGE | INTRAVENOUS | Status: DC | PRN
  Administered 2021-07-15: 100 mg via INTRAVENOUS

## 2021-07-15 MED ORDER — LACTATED RINGERS IV SOLN: INTRAVENOUS | Status: DC

## 2021-07-15 MED ORDER — SODIUM CHLORIDE 0.9% IV SOLUTION: Freq: Once | INTRAVENOUS | Status: AC

## 2021-07-15 NOTE — Progress Notes (Signed)
Date and time results received: 07/15/21 0905 (use smartphrase ".now" to insert current time)  Test: hemoglobin  Critical Value: 6.4  Name of Provider Notified: Dr. Carles Collet  Orders Received? Or Actions Taken?: awaiting orders

## 2021-07-15 NOTE — Anesthesia Postprocedure Evaluation (Signed)
Anesthesia Post Note  Patient: [name]  Procedure(s) Performed: ESOPHAGOGASTRODUODENOSCOPY (EGD) WITH PROPOFOL BIOPSY HEMOSTASIS CLIP PLACEMENT  Patient location during evaluation: PACU Anesthesia Type: General Level of consciousness: awake and alert and oriented Pain management: pain level controlled Vital Signs Assessment: post-procedure vital signs reviewed and stable Respiratory status: spontaneous breathing, nonlabored ventilation, respiratory function stable and patient connected to nasal cannula oxygen Cardiovascular status: blood pressure returned to baseline and stable Postop Assessment: no apparent nausea or vomiting Anesthetic complications: no   No notable events documented.   Last Vitals:  Vitals:   07/15/21 1600 07/15/21 1615  BP: 114/62 109/60  Pulse: 62 64  Resp: 18 16  Temp:  36.6 C  SpO2: 94% 98%    Last Pain:  Vitals:   07/15/21 1615  TempSrc:   PainSc: 0-No pain                 Cardell Rachel C Harper Smoker

## 2021-07-15 NOTE — Brief Op Note (Signed)
07/12/2021 - 07/15/2021  3:18 PM  PATIENT:  [name]  88 y.o. male  PRE-OPERATIVE DIAGNOSIS:  anemia, heme positive stool  POST-OPERATIVE DIAGNOSIS:  Gastritis; Clot @ GE junction which was removed. CLip and Epi placement.   PROCEDURE:  Procedure(s): ESOPHAGOGASTRODUODENOSCOPY (EGD) WITH PROPOFOL (N/A) BIOPSY HEMOSTASIS CLIP PLACEMENT  SURGEON:  Surgeon(s) and Role:    * Harvel Quale, MD - Primary  Patient underwent Egd under GA. Tolerated procedure well. EGD shwoed Clotted blood was found at the gastroesophageal junction. This was sfirmly attached and could not be removed with a snare. Biopsies were taken with a cold forceps for histology and to remove clot. There was persistent oozing of blod from bleow the clot..  For hemostasis, one hemostatic clip was successfully placed.  There was no bleeding during the procedure.  Area was successfully injected with 4 mL of a 1:10,000 solution of epinephrine for hemostasis. Mucosal changes characterized by sloughing of mucosa were found in the lower third of the esophagus. Diffuse mild inflammation characterized by erythema was found in the entire examined stomach.  Upon careful inspection, no presence of hematin or active bleeding was present. The examined duodenum was normal.  Upon careful inspection, no presence of hematin or active bleeding was present.  RECOMMENDATIONS: - Return patient to hospital ward for ongoing care.  - Full liquid diet today.  - Continue pantoprazole 40 mg BID - Await pathology results.  - Check H/H in AM  Maylon Peppers, MD Gastroenterology and Hepatology [facility]

## 2021-07-15 NOTE — Anesthesia Procedure Notes (Signed)
Procedure Name: Intubation Date/Time: 07/15/2021 2:28 PM Performed by: Jonna Munro, CRNA Pre-anesthesia Checklist: Patient identified, Emergency Drugs available, Suction available, Patient being monitored and Timeout performed Patient Re-evaluated:Patient Re-evaluated prior to induction Oxygen Delivery Method: Circle system utilized Preoxygenation: Pre-oxygenation with 100% oxygen Induction Type: IV induction, Rapid sequence and Cricoid Pressure applied Laryngoscope Size: Mac and 3 Grade View: Grade I Tube type: Oral Tube size: 7.5 mm Number of attempts: 1 Airway Equipment and Method: Stylet Placement Confirmation: ETT inserted through vocal cords under direct vision, positive ETCO2 and breath sounds checked- equal and bilateral Secured at: 23 cm Tube secured with: Tape Dental Injury: Teeth and Oropharynx as per pre-operative assessment

## 2021-07-15 NOTE — Assessment & Plan Note (Addendum)
Palliative following S/p discussion with patient and daughter--Full scope of care for now Would consider DNR/comfort measures if frequency of PRBC transfusions become more frequent

## 2021-07-15 NOTE — Progress Notes (Signed)
We will proceed with EGD as scheduled.  I thoroughly discussed with the patient his procedure, including the risks involved. Patient understands what the procedure involves including the benefits and any risks. Patient understands alternatives to the proposed procedure. Risks including (but not limited to) bleeding, tearing of the lining (perforation), rupture of adjacent organs, problems with heart and lung function, infection, and medication reactions. A small percentage of complications may require surgery, hospitalization, repeat endoscopic procedure, and/or transfusion.  Patient understood and agreed.  Aldean Suddeth Castaneda, MD Gastroenterology and Hepatology [facility]  

## 2021-07-15 NOTE — Progress Notes (Addendum)
PROGRESS NOTE  [name] SJG:[id number] DOB: [birth year] DOA: 07/12/2021 PCP: Sharilyn Sites, MD  Brief History:  88 year old male with a history of chronic respiratory failure on 3 L, COPD, CKD stage III, myelodysplastic syndrome with pancytopenia presenting with a few day history of generalized weakness, and lightheadedness.  The patient had increased his home oxygen up to 4 L.  When EMS was activated, the patient was noted to be hypoxic at 86-90 percent on 6 L.  He was placed on nonrebreather temporarily.  She was also noted to be hypotensive with a blood pressure of 84/53.  He became less responsive during the transfer.  He was given 1 L of fluid with improvement of his responsiveness.  The patient was at his dentist on 07/10/21 when he had a near syncopal episode.  His tooth extraction procedure was cancelled.  He had another near syncopal episode on am 07/12/21 after walking to his kitchen preceded by dizziness.  The patient himself denies any fevers, chills, headache, neck pain, coughing, hemoptysis, abdominal pain, dysuria, hematuria.  He states that he has been having "dark stools" but not frank melena.  He denied hematochezia.  He did have an episode of nausea and vomiting in the emergency department without blood.  He states that he has been feeling dizzy with syncopal and near syncopal episodes for the past 2 to 3 weeks.  He does feel dizzy upon standing.  He denies any focal extremity weakness, visual disturbance, or dysarthria. In the ED, the patient was afebrile but hypotensive initially with a blood pressure 79/61.  He was given an additional bolus of 500 cc and started on maintenance IV fluids.  2 units PRBC were ordered.  WBC was 6.1, hemoglobin 5.3, platelets 41,000.  BMP showed sodium 138, potassium 3.4, bicarbonate 23, serum creatinine 1.57. Notably, the patient has been under the care of med/onc, Dr. Delton Coombes.  The patient was last seen in the oncology clinic 07/07/2021 at  which time the patient was noted to have hemoglobin 6.4.  He was transfused 2 units PRBC at that time.  Review of his medical record shows that the patient has had numerous blood transfusions in the past year secondary to symptomatic anemia.  Recent bone marrow biopsy on 08/04/2020 shows now that he is high risk MDS.  In addition, his most recent bone marrow biopsy on 05/26/2021 shows 6% blasts which was higher than his previous bone marrow's.  The patient has been treated with Azacitidine from 09/16/2020 to 04/21/2021, but the decision was made to hold treatment recently secondary to the patient's recent hospitalization in January for syncope secondary to symptomatic anemia.  He is not on any treatment for his MDS presently.  He has been noted to have low blood pressures, and his midodrine dose was increased to 5 mg 3 times daily on 07/07/2021.  Note has been made of his continued weight loss.      Assessment and Plan: * Acute on chronic anemia- (present on admission) Patient has had numerous PRBC transfusions every 2-4 weeks Now with Symptomatic anemia Received 5 units total this admission>> most recent 2 units on 2/28 -GI consulted>>EGD done on 2/27 and 2/28 -hematology consult appreciate>>discussed with Dr. Delton Coombes 2/28 EGD-- persistent oozing of blod from bleow the clot (GE junction)..  For hemostasis, one hemostatic clip was successfully placed.  There was no bleeding during the procedure.  Area was successfully injected with 4 mL of a 1:10,000 solution  of epinephrine for hemostasis. -full liquid diet and await biopsy results  Heme positive stool GI consulted - EGD 2/27 and 2/28 Brown stool noted on rectal exam -see above for plan  Thrombocytopenia ([place])- (present on admission) Has been chronic due to MDS,  30-40K range Continue serial CBC  Goals of care, counseling/discussion Palliative following I had long discussion with patient and daughter--Full scope of care for now Would consider  DNR/comfort measures if frequency of PRBC transfusions become more frequent  Near syncope Due to symptomatic anemia Has had numerous episodes Recent admission 1/30-1/31 for the same Has HHPT He is on chronic midodrine, also for low BPs   Chronic respiratory failure with hypoxia /// 3L/min at baseline- (present on admission) Stable on 3L currently Continue albuterol prn which is his only listed med for COPD  CKD (chronic kidney disease), stage IIIB- (present on admission) Baseline creatinine 1.7-2.0  Myelodysplastic syndrome ([place])- (present on admission) Follows Dr. Delton Coombes 07/09/21 follow up with Dr. Delton Coombes states treatment of MDS on hold due to hospitalizations for syncope Previously on Azacitidine 09/16/20 to 04/21/21 Appreciate Dr. Delton Coombes consult>>did not feel MDS causing the more rapid drop in Hgb  COPD (chronic obstructive pulmonary disease) ([place])- (present on admission) Stable on 3L [place] without worsening dyspnea Continue prn albuterol which is his only listed med      Status is: Inpatient Remains inpatient appropriate because: due to symptomatic anemia with heme positive stool needing further work up;  Hgb continues to drop after transfusion                       Family Communication:  daughter (in [place]) updated 2/28 Consultants:  GI   Code Status:  FULL    DVT Prophylaxis:  SCDs     Procedures: As Listed in Progress Note Above   Antibiotics: None    Subjective: Patient denies fevers, chills, headache, chest pain, dyspnea, nausea, vomiting, diarrhea, abdominal pain, dysuria, hematuria, hematochezia, and melena.   Objective: Vitals:   07/15/21 1545 07/15/21 1600 07/15/21 1615 07/15/21 1713  BP: 117/64 114/62 109/60 119/64  Pulse: 72 62 64 61  Resp: (!) '21 18 16 18  ' Temp:   97.8 F (36.6 C) (!) 97.4 F (36.3 C)  TempSrc:    Oral  SpO2: (!) 83% 94% 98% 99%  Weight:      Height:        Intake/Output Summary (Last 24 hours) at  07/15/2021 1723 Last data filed at 07/15/2021 1515 Gross per 24 hour  Intake 1208 ml  Output 1000 ml  Net 208 ml   Weight change:  Exam:  General:  Pt is alert, follows commands appropriately, not in acute distress HEENT: No icterus, No thrush, No neck mass, [place]/AT Cardiovascular: RRR, S1/S2, no rubs, no gallops Respiratory: diminished BS at bases.  no wheezing, no crackles, no rhonchi Abdomen: Soft/+BS, non tender, non distended, no guarding Extremities: No edema, No lymphangitis, No petechiae, No rashes, no synovitis   Data Reviewed: I have personally reviewed following labs and imaging studies Basic Metabolic Panel: Recent Labs  Lab 07/12/21 1405 07/13/21 0614 07/14/21 0629 07/15/21 0523  NA 138 136 136 136  K 3.4* 4.3 3.6 3.5  CL 110 107 108 107  CO2 '23 23 23 22  ' GLUCOSE 102* 110* 94 79  BUN 52* 59* 57* 48*  CREATININE 1.57* 1.64* 1.63* 1.48*  CALCIUM 9.3 8.7* 8.7* 8.5*  MG  --   --  1.9  --  Liver Function Tests: Recent Labs  Lab 07/12/21 1405  AST 18  ALT 12  ALKPHOS 51  BILITOT 0.5  PROT 6.1*  ALBUMIN 2.6*   No results for input(s): LIPASE, AMYLASE in the last 168 hours. No results for input(s): AMMONIA in the last 168 hours. Coagulation Profile: Recent Labs  Lab 07/12/21 1700  INR 1.2   CBC: Recent Labs  Lab 07/12/21 1405 07/13/21 0614 07/14/21 0629 07/15/21 0805  WBC 6.1 3.8* 8.3 6.8  NEUTROABS 4.3  --   --   --   HGB 5.3* 8.4* 7.3* 6.4*  HCT 16.6* 26.3* 22.0* 19.5*  MCV 86.0 88.3 88.7 89.0  PLT 41* 38* 34* 37*   Cardiac Enzymes: No results for input(s): CKTOTAL, CKMB, CKMBINDEX, TROPONINI in the last 168 hours. BNP: Invalid input(s): POCBNP CBG: No results for input(s): GLUCAP in the last 168 hours. HbA1C: No results for input(s): HGBA1C in the last 72 hours. Urine analysis:    Component Value Date/Time   COLORURINE YELLOW 06/16/2021 0853   APPEARANCEUR CLEAR 06/16/2021 0853   APPEARANCEUR Hazy (A) 03/26/2021 1334   LABSPEC  1.025 06/16/2021 0853   PHURINE 6.0 06/16/2021 0853   GLUCOSEU NEGATIVE 06/16/2021 0853   HGBUR TRACE (A) 06/16/2021 0853   BILIRUBINUR NEGATIVE 06/16/2021 0853   BILIRUBINUR Negative 03/26/2021 1334   KETONESUR NEGATIVE 06/16/2021 0853   PROTEINUR TRACE (A) 06/16/2021 0853   UROBILINOGEN 0.2 08/15/2013 1240   NITRITE NEGATIVE 06/16/2021 0853   LEUKOCYTESUR NEGATIVE 06/16/2021 0853   Sepsis Labs: '@LABRCNTIP' (procalcitonin:4,lacticidven:4) ) Recent Results (from the past 240 hour(s))  Resp Panel by RT-PCR (Flu A&B, Covid) Nasopharyngeal Swab     Status: None   Collection Time: 07/12/21  4:05 PM   Specimen: Nasopharyngeal Swab; Nasopharyngeal(NP) swabs in vial transport medium  Result Value Ref Range Status   SARS Coronavirus 2 by RT PCR NEGATIVE NEGATIVE Final    Comment: (NOTE) SARS-CoV-2 target nucleic acids are NOT DETECTED.  The SARS-CoV-2 RNA is generally detectable in upper respiratory specimens during the acute phase of infection. The lowest concentration of SARS-CoV-2 viral copies this assay can detect is 138 copies/mL. A negative result does not preclude SARS-Cov-2 infection and should not be used as the sole basis for treatment or other patient management decisions. A negative result may occur with  improper specimen collection/handling, submission of specimen other than nasopharyngeal swab, presence of viral mutation(s) within the areas targeted by this assay, and inadequate number of viral copies(<138 copies/mL). A negative result must be combined with clinical observations, patient history, and epidemiological information. The expected result is Negative.  Fact Sheet for Patients:  EntrepreneurPulse.com.au  Fact Sheet for Healthcare Providers:  IncredibleEmployment.be  This test is no t yet approved or cleared by the [place] FDA and  has been authorized for detection and/or diagnosis of SARS-CoV-2 by FDA under an  Emergency Use Authorization (EUA). This EUA will remain  in effect (meaning this test can be used) for the duration of the COVID-19 declaration under Section 564(b)(1) of the Act, 21 U.S.C.section 360bbb-3(b)(1), unless the authorization is terminated  or revoked sooner.       Influenza A by PCR NEGATIVE NEGATIVE Final   Influenza B by PCR NEGATIVE NEGATIVE Final    Comment: (NOTE) The Xpert Xpress SARS-CoV-2/FLU/RSV plus assay is intended as an aid in the diagnosis of influenza from Nasopharyngeal swab specimens and should not be used as a sole basis for treatment. Nasal washings and aspirates are unacceptable for Xpert Xpress SARS-CoV-2/FLU/RSV testing.  Fact Sheet for Patients: EntrepreneurPulse.com.au  Fact Sheet for Healthcare Providers: IncredibleEmployment.be  This test is not yet approved or cleared by the [place] FDA and has been authorized for detection and/or diagnosis of SARS-CoV-2 by FDA under an Emergency Use Authorization (EUA). This EUA will remain in effect (meaning this test can be used) for the duration of the COVID-19 declaration under Section 564(b)(1) of the Act, 21 U.S.C. section 360bbb-3(b)(1), unless the authorization is terminated or revoked.  Performed at [facility], [address] [place], [place] [address]   MRSA Next Gen by PCR, Nasal     Status: None   Collection Time: 07/12/21  8:00 PM   Specimen: Nasal Mucosa; Nasal Swab  Result Value Ref Range Status   MRSA by PCR Next Gen NOT DETECTED NOT DETECTED Final    Comment: (NOTE) The GeneXpert MRSA Assay (FDA approved for NASAL specimens only), is one component of a comprehensive MRSA colonization surveillance program. It is not intended to diagnose MRSA infection nor to guide or monitor treatment for MRSA infections. Test performance is not FDA approved in patients less than 40 years old. Performed at [facility], [address], [place], [place]  [address]      Scheduled Meds:  Chlorhexidine Gluconate Cloth  6 each Topical Daily   feeding supplement  1 Container Oral TID BM   midodrine  2.5 mg Oral TID WC   pantoprazole (PROTONIX) IV  40 mg Intravenous Once   pantoprazole (PROTONIX) IV  40 mg Intravenous Q12H   penicillin v potassium  500 mg Oral Q6H   predniSONE  5 mg Oral Q breakfast   tamsulosin  0.4 mg Oral QPC supper   Continuous Infusions:  Procedures/Studies: DG Chest Port 1 View  Result Date: 07/12/2021 CLINICAL DATA:  Severe shortness of breath and weakness for 1 day. Near syncopal episode. COPD. EXAM: PORTABLE CHEST 1 VIEW COMPARISON:  06/16/2021 FINDINGS: Left-sided power port remains in appropriate position. Heart size is stable. Marked pulmonary hyperinflation is again demonstrated. Coarse diffuse interstitial infiltrates are unchanged. No evidence of superimposed pulmonary airspace disease or pleural effusion. Lower thoracic spine vertebroplasties again noted. IMPRESSION: Stable COPD and chronic interstitial lung disease. No acute findings. Electronically Signed   By: Marlaine Hind M.D.   On: 07/12/2021 14:16   DG Chest Port 1 View  Result Date: 06/16/2021 CLINICAL DATA:  An 88 year old male presents with question of sepsis. EXAM: PORTABLE CHEST 1 VIEW COMPARISON:  February 15, 2021 radiographs and CT imaging from that date. FINDINGS: EKG leads project over the chest. LEFT-sided Port-A-Cath terminates at the caval to atrial junction. Cardiomediastinal contours and hilar structures are stable elongated due to hyperinflation that has been present over a series of prior studies. Interstitial thickening which was present previously is is perhaps slightly worse compared to the study of February 15, 2021. No lobar consolidative process.  No visible pneumothorax. Signs of cement augmentation and thoracic lumbar vertebral bodies not well evaluated. IMPRESSION: Chronic interstitial thickening with some worsening may reflect worsening of  background interstitial lung disease. The possibility of superimposed infection is not excluded. No signs of lobar consolidation or visible evidence of pleural effusion. Electronically Signed   By: Zetta Bills M.D.   On: 06/16/2021 09:05    Orson Eva, DO  [facility]  If 7PM-7AM, please contact night-coverage [email or web address] Password [facility] 07/15/2021, 5:23 PM   LOS: 3 days

## 2021-07-15 NOTE — Consult Note (Signed)
Consultation Note Date: 07/15/2021   Patient Name: [name]  DOB: 10-Apr-1930  MRN: [id number]  Age / Sex: 88 y.o., male  PCP: Sharilyn Sites, MD Referring Physician: Orson Eva, MD  Reason for Consultation: Establishing goals of care  HPI/Patient Profile: 88 y.o. male  with past medical history of COPD, CKD 3, GERD, multiple myeloma, pancytopenia secondary to treatment for mild dysplastic syndrome and multiple myeloma, followed by Dr. Raliegh Ip, multiple recent blood transfusions, admitted on 07/12/2021 with acute on chronic anemia present on admission with packed red blood cell transfusions every 2 to 4 weeks.   Clinical Assessment and Goals of Care: I have reviewed medical records including EPIC notes, labs and imaging, received report from RN, assessed the patient.  [name] is sitting up in bed.  He greets me, making and mostly keeping eye contact.  He is quite sharp for his age and does not appear overly ill other than to be thin.  He is alert and oriented, able to make his basic needs known.    We meet at the bedside to discuss diagnosis prognosis, GOC, EOL wishes, disposition and options.  I introduced Palliative Medicine as specialized medical care for people living with serious illness. It focuses on providing relief from the symptoms and stress of a serious illness. The goal is to improve quality of life for both the patient and the family.  [name] has been seen by the inpatient palliative team in the past.  We discussed a brief life review of the patient.   [name] lives alone, independently.  His children live out of state.    We then focused on their current illness.  [name] tells me that he had blood transfusion 1 unit on February 20, 3 units on February 26, and is now scheduled for another 2 units today.  He is able to verbalize testing accurately, and seems quite knowledgeable about his  overall health and disease process.  The natural disease trajectory and expectations at EOL were discussed.    We talk about some "what if's and maybe's".  I asked [name] if he had considered what would happen if he were no longer able to receive blood due to matching.  He shares that he is aware that this could happen.  He talks about his faith and his acceptance.  We talk about the the limitations of modern medicine to reverse chronic problems.  We talk about preferred place of death.  [name] tells me that he has had discussions about burial choices with his children.  I encouraged him to think about what this time looks like before he dies.  Advanced directives, concepts specific to code status, artifical feeding and hydration, and rehospitalization were considered and discussed.  [name] tells me that his family came in for his oldest daughter's birthday around January 25.  He shares that they had a detailed discussion about CODE STATUS with all of his children.  At this point, he tells me he would want attempted  resuscitation, but not be on long-term life support.  He would not accept trach.  He shares that his family is aware of this choice.  Palliative Care services outpatient were explained and offered.  We talk about the benefits of outpatient palliative services for further goals of care discussion.  At this point he is considering outpatient palliative.  He shares that he does not feel he would need short-term rehab, and prefers to return to his own home.  Discussed the importance of continued conversation with family and the medical providers regarding overall plan of care and treatment options, ensuring decisions are within the context of the patients values and GOCs.  Questions and concerns were addressed.   The patient was encouraged to call with questions or concerns.  PMT will continue to support holistically.  Conference with attending, oncologist, bedside nursing staff,  transition of care team related to patient condition, needs, goals of care, disposition.   HCPOA  NEXT OF KIN -[name] is able to make his own choices.     SUMMARY OF RECOMMENDATIONS   At this point continue to treat the treatable Would except short-term life support but no trach   Code Status/Advance Care Planning: Full code -we talked about the concept of "treat the treatable, but allowing natural passing".  Symptom Management:  Per hospitalist, no additional needs at this time.  Palliative Prophylaxis:  No special needs at this time  Additional Recommendations (Limitations, Scope, Preferences): Full Scope Treatment  Psycho-social/Spiritual:  Desire for further Chaplaincy support:no Additional Recommendations: Caregiving  Support/Resources  Prognosis:  Unable to determine, based on outcomes.  Guarded at this point.  3 to 6 months or less would not be surprising based on acuity of condition, need for transfusion.  Discharge Planning:  Anticipate discharging home, at this point no clear needs for home health, considering outpatient palliative.       Primary Diagnoses: Present on Admission:  Acute on chronic anemia  COPD (chronic obstructive pulmonary disease) (HCC)  Myelodysplastic syndrome (HCC)  CKD (chronic kidney disease), stage IIIB  Thrombocytopenia (HCC)  Chronic respiratory failure with hypoxia /// 3L/min at baseline   I have reviewed the medical record, interviewed the patient and family, and examined the patient. The following aspects are pertinent.  Past Medical History:  Diagnosis Date   Bone cancer ([place])    Chronic kidney insufficiency    COPD (chronic obstructive pulmonary disease) (HCC)    GERD (gastroesophageal reflux disease)    Pulmonary nodule    Social History   Socioeconomic History   Marital status: Widowed    Spouse name: Not on file   Number of children: Not on file   Years of education: Not on file   Highest education level:  Not on file  Occupational History   Not on file  Tobacco Use   Smoking status: Former    Years: 15.00    Types: Cigarettes, Cigars    Quit date: 10/01/1990    Years since quitting: 30.8   Smokeless tobacco: Never   Tobacco comments:    Quit x 25-30 years; 2 cigars daily when smoked  Vaping Use   Vaping Use: Never used  Substance and Sexual Activity   Alcohol use: Not Currently    Alcohol/week: 0.0 standard drinks   Drug use: No   Sexual activity: Never    Birth control/protection: None  Other Topics Concern   Not on file  Social History Narrative   Not on file   Social Determinants of  Health   Financial Resource Strain: Not on file  Food Insecurity: Not on file  Transportation Needs: Not on file  Physical Activity: Not on file  Stress: Not on file  Social Connections: Not on file   Family History  Problem Relation Age of Onset   Kidney disease Mother    Kidney disease Sister    Colon cancer Neg Hx    Gastric cancer Neg Hx    Esophageal cancer Neg Hx    Scheduled Meds:  Chlorhexidine Gluconate Cloth  6 each Topical Daily   feeding supplement  1 Container Oral TID BM   midodrine  2.5 mg Oral TID WC   pantoprazole (PROTONIX) IV  40 mg Intravenous Once   pantoprazole (PROTONIX) IV  40 mg Intravenous Q12H   penicillin v potassium  500 mg Oral Q6H   predniSONE  5 mg Oral Q breakfast   tamsulosin  0.4 mg Oral QPC supper   Continuous Infusions: PRN Meds:.albuterol, ondansetron **OR** ondansetron (ZOFRAN) IV Medications Prior to Admission:  Prior to Admission medications   Medication Sig Start Date End Date Taking? Authorizing Provider  albuterol (VENTOLIN HFA) 108 (90 Base) MCG/ACT inhaler Inhale 2 puffs into the lungs every 4 (four) hours as needed for wheezing or shortness of breath. 04/04/20  Yes Emokpae, Courage, MD  midodrine (PROAMATINE) 2.5 MG tablet Take 1 tablet (2.5 mg total) by mouth 3 (three) times daily with meals. 06/23/21  Yes Derek Jack, MD   penicillin v potassium (VEETID) 500 MG tablet Take 500 mg by mouth every 6 (six) hours. 06/28/21  Yes [provider]  predniSONE (DELTASONE) 5 MG tablet Take 1 tablet (5 mg total) by mouth daily with breakfast. 07/07/21  Yes Derek Jack, MD  silodosin (RAPAFLO) 4 MG CAPS capsule Take 4 mg by mouth at bedtime. 06/27/21  Yes [provider]   No Known Allergies Review of Systems  Unable to perform ROS: Age   Physical Exam Vitals and nursing note reviewed.  Constitutional:      General: He is not in acute distress.    Appearance: He is not ill-appearing.  Cardiovascular:     Rate and Rhythm: Normal rate.  Pulmonary:     Effort: Pulmonary effort is normal. No tachypnea.  Skin:    General: Skin is warm and dry.  Neurological:     Mental Status: He is alert and oriented to person, place, and time.  Psychiatric:        Mood and Affect: Mood normal.        Behavior: Behavior normal.    Vital Signs: BP (!) 99/51 (BP Location: Left Arm)    Pulse 66    Temp 98 F (36.7 C) (Oral)    Resp 16    Ht 6' (1.829 m)    Wt 57.3 kg    SpO2 94%    BMI 17.13 kg/m  Pain Scale: 0-10 POSS *See Group Information*: 1-Acceptable,Awake and alert Pain Score: 0-No pain   SpO2: SpO2: 94 % O2 Device:SpO2: 94 % O2 Flow Rate: .O2 Flow Rate (L/min): 3 L/min  IO: Intake/output summary:  Intake/Output Summary (Last 24 hours) at 07/15/2021 9629 Last data filed at 07/15/2021 0600 Gross per 24 hour  Intake 660 ml  Output 1000 ml  Net -340 ml    LBM: Last BM Date : 07/11/21 Baseline Weight: Weight: 53.5 kg Most recent weight: Weight: 57.3 kg     Palliative Assessment/Data:   Administrator, Civil Service Row Most  Recent Value  Intake Tab   Referral Department Hospitalist  Unit at Time of Referral ICU  Palliative Care Primary Diagnosis Other (Comment)  Date Notified 07/14/21  Palliative Care Type Return patient Palliative Care  Reason for referral Clarify Goals of Care  Date  of Admission 07/12/21  Date first seen by Palliative Care 07/14/21  # of days Palliative referral response time 0 Day(s)  # of days IP prior to Palliative referral 2  Clinical Assessment   Palliative Performance Scale Score 40%  Pain Max last 24 hours Not able to report  Pain Min Last 24 hours Not able to report  Dyspnea Max Last 24 Hours Not able to report  Dyspnea Min Last 24 hours Not able to report  Psychosocial & Spiritual Assessment   Palliative Care Outcomes        Time In: 0840 Time Out: 0955 Time Total: 75 minutes  Greater than 50%  of this time was spent counseling and coordinating care related to the above assessment and plan.  Signed by: Drue Novel, NP   Please contact Palliative Medicine Team phone at [phone] for questions and concerns.  For individual provider: See Shea Evans

## 2021-07-15 NOTE — Transfer of Care (Signed)
Immediate Anesthesia Transfer of Care Note  Patient: [name]  Procedure(s) Performed: ESOPHAGOGASTRODUODENOSCOPY (EGD) WITH PROPOFOL BIOPSY HEMOSTASIS CLIP PLACEMENT  Patient Location: PACU  Anesthesia Type:General  Level of Consciousness: awake, alert  and patient cooperative  Airway & Oxygen Therapy: Patient Spontanous Breathing and Patient connected to nasal cannula oxygen  Post-op Assessment: Report given to RN, Post -op Vital signs reviewed and stable and Patient moving all extremities X 4  Post vital signs: Reviewed and stable  Last Vitals:  Vitals Value Taken Time  BP    Temp    Pulse 88 07/15/21 1511  Resp 19 07/15/21 1511  SpO2 97 % 07/15/21 1511  Vitals shown include unvalidated device data.  Last Pain:  Vitals:   07/15/21 1424  TempSrc:   PainSc: 0-No pain      Patients Stated Pain Goal: 5 (37/09/64 3838)  Complications: No notable events documented.

## 2021-07-15 NOTE — Op Note (Addendum)
[facility] Patient Name: [name] Procedure Date: 07/15/2021 1:57 PM MRN: [id number] Date of Birth: [birth year] Attending MD: Maylon Peppers ,  CSN: [id number] Age: 88 Admit Type: Inpatient Procedure:                Upper GI endoscopy Indications:              Iron deficiency anemia, Melena Providers:                Maylon Peppers, Charlsie Quest. Theda Sers RN, RN, Raphael Gibney Tech., Technician Referring MD:              Medicines:                Monitored Anesthesia Care Complications:            No immediate complications. Estimated Blood Loss:     Estimated blood loss: none. Procedure:                Pre-Anesthesia Assessment:                           - Prior to the procedure, a History and Physical                            was performed, and patient medications, allergies                            and sensitivities were reviewed. The patient's                            tolerance of previous anesthesia was reviewed.                           - The risks and benefits of the procedure and the                            sedation options and risks were discussed with the                            patient. All questions were answered and informed                            consent was obtained.                           - ASA Grade Assessment: III - A patient with severe                            systemic disease.                           After obtaining informed consent, the endoscope was                            passed under direct vision. Throughout the  procedure, the patient's blood pressure, pulse, and                            oxygen saturations were monitored continuously. The                            GIF-H190 ([id number]) scope was introduced through the                            mouth, and advanced to the second part of duodenum.                            The upper GI endoscopy was accomplished without                             difficulty. The patient tolerated the procedure                            well. Scope In: 2:36:47 PM Scope Out: 3:00:20 PM Total Procedure Duration: 0 hours 23 minutes 33 seconds  Findings:      Clotted blood was found at the gastroesophageal junction. This was       sfirmly attached and could not be removed with a snare. Biopsies were       taken with a cold forceps for histology and to remove clot. There was       persistent oozing of blod from bleow the clot.. For hemostasis, one       hemostatic clip was successfully placed. There was no bleeding during       the procedure. Area was successfully injected with 4 mL of a 1:10,000       solution of epinephrine for hemostasis.      Mucosal changes characterized by sloughing of mucosa were found in the       lower third of the esophagus.      Diffuse mild inflammation characterized by erythema was found in the       entire examined stomach. Upon careful inspection, no presence of hematin       or active bleeding was present.      The examined duodenum was normal. Upon careful inspection, no presence       of hematin or active bleeding was present. Impression:               - Clotted blood at the gastroesophageal junction.                            Biopsied. Clip was placed. Injected.                           - Sloughing of mucosa mucosa in the esophagus.                           - Gastritis.                           - Normal examined duodenum. Moderate Sedation:      Per Anesthesia Care Recommendation:           -  Return patient to hospital ward for ongoing care.                           - Full liquid diet today.                           - Continue pantoprazole 40 mg BID                           - Await pathology results.                           - Check H/H in AM Procedure Code(s):        --- Professional ---                           57473, 19, Esophagogastroduodenoscopy, flexible,                             transoral; with control of bleeding, any method                           43239, Esophagogastroduodenoscopy, flexible,                            transoral; with biopsy, single or multiple Diagnosis Code(s):        --- Professional ---                           K22.8, Other specified diseases of esophagus                           K29.70, Gastritis, unspecified, without bleeding                           D50.9, Iron deficiency anemia, unspecified                           K92.1, Melena (includes Hematochezia) CPT copyright 2019 American Medical Association. All rights reserved. The codes documented in this report are preliminary and upon coder review may  be revised to meet current compliance requirements. Maylon Peppers, MD Maylon Peppers,  07/15/2021 3:17:57 PM This report has been signed electronically. Number of Addenda: 0

## 2021-07-15 NOTE — Anesthesia Preprocedure Evaluation (Addendum)
Anesthesia Evaluation  Patient identified by MRN, date of birth, ID band Patient awake    Reviewed: Allergy & Precautions, NPO status , Patient's Chart, lab work & pertinent test results  Airway Mallampati: II  TM Distance: <3 FB Neck ROM: Full    Dental  (+) Dental Advisory Given, Edentulous Upper, Partial Lower   Pulmonary pneumonia, COPD,  COPD inhaler and oxygen dependent, former smoker,    Pulmonary exam normal breath sounds clear to auscultation       Cardiovascular Exercise Tolerance: Poor Normal cardiovascular exam Rhythm:Regular Rate:Normal     Neuro/Psych PSYCHIATRIC DISORDERS Depression negative neurological ROS  negative psych ROS   GI/Hepatic Neg liver ROS, GERD  Medicated,  Endo/Other  negative endocrine ROS  Renal/GU Renal InsufficiencyRenal disease  negative genitourinary   Musculoskeletal negative musculoskeletal ROS (+)   Abdominal   Peds negative pediatric ROS (+)  Hematology  (+) Blood dyscrasia (myelodysplastic syndrome, thrombocytopenia - 34), anemia ,   Anesthesia Other Findings Low back pain  Reproductive/Obstetrics negative OB ROS                             Anesthesia Physical  Anesthesia Plan  ASA: 4 and emergent  Anesthesia Plan: General   Post-op Pain Management: Minimal or no pain anticipated   Induction: Intravenous  PONV Risk Score and Plan: 3 and Ondansetron  Airway Management Planned: Oral ETT  Additional Equipment:   Intra-op Plan:   Post-operative Plan: Extubation in OR and Possible Post-op intubation/ventilation  Informed Consent: I have reviewed the patients History and Physical, chart, labs and discussed the procedure including the risks, benefits and alternatives for the proposed anesthesia with the patient or authorized representative who has indicated his/her understanding and acceptance.     Dental advisory given  Plan  Discussed with: CRNA and Surgeon  Anesthesia Plan Comments:        Anesthesia Quick Evaluation

## 2021-07-16 DIAGNOSIS — D649 Anemia, unspecified: Secondary | ICD-10-CM | POA: Diagnosis not present

## 2021-07-16 LAB — CBC
HCT: 29 % — ABNORMAL LOW (ref 39.0–52.0)
Hemoglobin: 9.5 g/dL — ABNORMAL LOW (ref 13.0–17.0)
MCH: 28.5 pg (ref 26.0–34.0)
MCHC: 32.8 g/dL (ref 30.0–36.0)
MCV: 87.1 fL (ref 80.0–100.0)
Platelets: 39 10*3/uL — ABNORMAL LOW (ref 150–400)
RBC: 3.33 MIL/uL — ABNORMAL LOW (ref 4.22–5.81)
RDW: 16.8 % — ABNORMAL HIGH (ref 11.5–15.5)
WBC: 7.2 10*3/uL (ref 4.0–10.5)
nRBC: 1.8 % — ABNORMAL HIGH (ref 0.0–0.2)

## 2021-07-16 LAB — TYPE AND SCREEN
ABO/RH(D): B NEG
Antibody Screen: NEGATIVE
Unit division: 0
Unit division: 0
Unit division: 0
Unit division: 0
Unit division: 0

## 2021-07-16 LAB — BPAM RBC
Blood Product Expiration Date: 202303152359
Blood Product Expiration Date: 202303312359
Blood Product Expiration Date: 202303312359
Blood Product Expiration Date: 202304012359
Blood Product Expiration Date: 202304012359
ISSUE DATE / TIME: 202302251737
ISSUE DATE / TIME: 202302252053
ISSUE DATE / TIME: 202302260326
ISSUE DATE / TIME: 202302281004
ISSUE DATE / TIME: 202302281322
Unit Type and Rh: 1700
Unit Type and Rh: 1700
Unit Type and Rh: 1700
Unit Type and Rh: 1700
Unit Type and Rh: 9500

## 2021-07-16 LAB — BASIC METABOLIC PANEL
Anion gap: 6 (ref 5–15)
BUN: 37 mg/dL — ABNORMAL HIGH (ref 8–23)
CO2: 21 mmol/L — ABNORMAL LOW (ref 22–32)
Calcium: 8.6 mg/dL — ABNORMAL LOW (ref 8.9–10.3)
Chloride: 107 mmol/L (ref 98–111)
Creatinine, Ser: 1.38 mg/dL — ABNORMAL HIGH (ref 0.61–1.24)
GFR, Estimated: 49 mL/min — ABNORMAL LOW (ref >60)
Glucose, Bld: 84 mg/dL (ref 70–99)
Potassium: 3.5 mmol/L (ref 3.5–5.1)
Sodium: 134 mmol/L — ABNORMAL LOW (ref 135–145)

## 2021-07-16 MED ORDER — PANTOPRAZOLE SODIUM 40 MG PO TBEC: 40.0000 mg | DELAYED_RELEASE_TABLET | Freq: Two times a day (BID) | ORAL | 1 refills | Status: DC

## 2021-07-16 MED ORDER — HEPARIN SOD (PORK) LOCK FLUSH 100 UNIT/ML IV SOLN
500.0000 [IU] | INTRAVENOUS | Status: AC | PRN
  Administered 2021-07-16: 500 [IU]
  Filled 2021-07-16: qty 5

## 2021-07-16 NOTE — Discharge Summary (Signed)
Physician Discharge Summary [facility]    Patient: [name]                   Admit date: 07/12/2021   DOB: 1929/07/05             Discharge date:07/16/2021/10:55 AM ZOX:[id number]                          PCP: Sharilyn Sites, MD  Disposition:   HOME     Recommendations for Outpatient Follow-up:   Follow up: With gastroenterologist in 1-2 weeks CBC within 1 week, results to GI Follow-up with PCP within 1-2 weeks Follow-up with palliative care as an outpatient Discharge Condition: Stable   Code Status:   Code Status: Full Code  Diet recommendation: Regular healthy diet   Discharge Diagnoses:    Principal Problem:   Acute on chronic anemia Active Problems:   Thrombocytopenia (HCC)   COPD (chronic obstructive pulmonary disease) (HCC)   Myelodysplastic syndrome (HCC)   CKD (chronic kidney disease), stage IIIB   Chronic respiratory failure with hypoxia /// 3L/min at baseline   Near syncope   Heme positive stool   Malnutrition of moderate degree   Goals of care, counseling/discussion   History of Present Illness/ Hospital Course Kathleen Argue Summary:    88 year old male with a history of chronic respiratory failure on 3 L, COPD, CKD stage III, myelodysplastic syndrome with pancytopenia presenting with a few day history of generalized weakness, and lightheadedness.  The patient had increased his home oxygen up to 4 L.  When EMS was activated, the patient was noted to be hypoxic at 86-90 percent on 6 L.  He was placed on nonrebreather temporarily.  She was also noted to be hypotensive with a blood pressure of 84/53.  He became less responsive during the transfer.  He was given 1 L of fluid with improvement of his responsiveness.  The patient was at his dentist on 07/10/21 when he had a near syncopal episode.  His tooth extraction procedure was cancelled.  He had another near syncopal episode on am 07/12/21 after walking to his kitchen preceded by dizziness.  The patient  himself denies any fevers, chills, headache, neck pain, coughing, hemoptysis, abdominal pain, dysuria, hematuria.  He states that he has been having "dark stools" but not frank melena.  He denied hematochezia.  He did have an episode of nausea and vomiting in the emergency department without blood.  He states that he has been feeling dizzy with syncopal and near syncopal episodes for the past 2 to 3 weeks.  He does feel dizzy upon standing.  He denies any focal extremity weakness, visual disturbance, or dysarthria. In the ED, the patient was afebrile but hypotensive initially with a blood pressure 79/61.  He was given an additional bolus of 500 cc and started on maintenance IV fluids.  2 units PRBC were ordered.  WBC was 6.1, hemoglobin 5.3, platelets 41,000.  BMP showed sodium 138, potassium 3.4, bicarbonate 23, serum creatinine 1.57. Notably, the patient has been under the care of med/onc, Dr. Delton Coombes.  The patient was last seen in the oncology clinic 07/07/2021 at which time the patient was noted to have hemoglobin 6.4.  He was transfused 2 units PRBC at that time.  Review of his medical record shows that the patient has had numerous blood transfusions in the past year secondary to symptomatic anemia.  Recent bone marrow biopsy on 08/04/2020 shows  now that he is high risk MDS.  In addition, his most recent bone marrow biopsy on 05/26/2021 shows 6% blasts which was higher than his previous bone marrow's.  The patient has been treated with Azacitidine from 09/16/2020 to 04/21/2021, but the decision was made to hold treatment recently secondary to the patient's recent hospitalization in January for syncope secondary to symptomatic anemia.  He is not on any treatment for his MDS presently.  He has been noted to have low blood pressures, and his midodrine dose was increased to 5 mg 3 times daily on 07/07/2021.  Note has been made of his continued weight loss.    Assessment and Plan: * Acute on chronic anemia-  (present on admission) Patient has had numerous PRBC transfusions every 2-4 weeks Now with Symptomatic anemia Received 5 units total this admission>> most recent 2 units on 2/28 -GI consulted>>EGD done on 2/27 and 2/28 -hematology consult appreciate>>discussed with Dr. Delton Coombes 2/28 EGD-- persistent oozing of blod from bleow the clot (GE junction)..  For hemostasis, one hemostatic clip was successfully placed.  There was no bleeding during the procedure.  Area was successfully injected with 4 mL of a 1:10,000 solution of epinephrine for hemostasis. -full liquid diet and await biopsy results -Hemoglobin 9.5 today  Goals of care, counseling/discussion Palliative following S/p discussion with patient and daughter--Full scope of care for now Would consider DNR/comfort measures if frequency of PRBC transfusions become more frequent  Malnutrition of moderate degree Stable encouraging oral intake, dietary supplements  Heme positive stool GI consulted - EGD 2/27 and 2/28 Brown stool noted on rectal exam -see above for plan  Near syncope Due to symptomatic anemia Has had numerous episodes Recent admission 1/30-1/31 for the same Has HHPT He is on chronic midodrine, also for low Bps -Vitals remained stable, no further episodes   Chronic respiratory failure with hypoxia /// 3L/min at baseline- (present on admission) Stable on 3L currently Continue albuterol prn which is his only listed med for COPD  CKD (chronic kidney disease), stage IIIB- (present on admission) Baseline creatinine 1.7-2.0 -Stable  Myelodysplastic syndrome ([place])- (present on admission) Follows Dr. Delton Coombes 07/09/21 follow up with Dr. Delton Coombes states treatment of MDS on hold due to hospitalizations for syncope Previously on Azacitidine 09/16/20 to 04/21/21 Appreciate Dr. Delton Coombes consult>>did not feel MDS causing the more rapid drop in Hgb -Hemoglobin remained stable, Follow-up with Dr. Delton Coombes as an  outpatient in 1 week   COPD (chronic obstructive pulmonary disease) ([place])- (present on admission) Stable on 3L [place] without worsening dyspnea Continue prn albuterol which is his only listed med Stable  Thrombocytopenia ([place])- (present on admission) Has been chronic due to MDS,  30-40K range Remained stable    Nutritional status:  Nutrition Problem: Moderate Malnutrition Etiology: chronic illness (COPD, CKD III, multiple myeloma) Signs/Symptoms: moderate fat depletion, severe muscle depletion, moderate muscle depletion Interventions: Boost Breeze  The patient's BMI is: Body mass index is 17.04 kg/m. I agree with the assessment and plan as outlined below:    Discharge Instructions:   Discharge Instructions     Activity as tolerated - No restrictions   Complete by: As directed    Call MD for:  extreme fatigue   Complete by: As directed    Call MD for:  persistant dizziness or light-headedness   Complete by: As directed    Call MD for:  persistant nausea and vomiting   Complete by: As directed    Call MD for:  redness, tenderness, or signs of  infection (pain, swelling, redness, odor or green/yellow discharge around incision site)   Complete by: As directed    Diet - low sodium heart healthy   Complete by: As directed    Discharge instructions   Complete by: As directed    Follow-up with gastroenterologist within 1 week, repeat CBC within 1 week prior to office visit with GI Monitor stool for dark/black stool, bloody stool--notify gastroenterologist if noted such changes.   Increase activity slowly   Complete by: As directed       Allergies as of 07/16/2021   No Known Allergies      Medication List     STOP taking these medications    penicillin v potassium 500 MG tablet Commonly known as: VEETID   predniSONE 5 MG tablet Commonly known as: DELTASONE       TAKE these medications    albuterol 108 (90 Base) MCG/ACT inhaler Commonly known as: VENTOLIN  HFA Inhale 2 puffs into the lungs every 4 (four) hours as needed for wheezing or shortness of breath.   midodrine 2.5 MG tablet Commonly known as: PROAMATINE Take 1 tablet (2.5 mg total) by mouth 3 (three) times daily with meals.   pantoprazole 40 MG tablet Commonly known as: Protonix Take 1 tablet (40 mg total) by mouth 2 (two) times daily.   silodosin 4 MG Caps capsule Commonly known as: RAPAFLO Take 4 mg by mouth at bedtime.         No Known Allergies   Quit smoking:  It is highly recommended that all people especially deals with diabetes to quit smoking or stay away from smoking, avoid secondhand smoking.   Vaccines:  Also highly recommended update your vaccines requirement, including SARS-CoV-2 , yearly  flu vaccine and pneumonia vaccine at least every 5 years.      Exercise: If you are able: 30 -60 minutes a day, 4 days a week, or 150 minutes a week.  The longer the better.  Combine stretch, strength, and aerobic activities.  If you were told in the past that you have high risk for cardiovascular diseases, you may seek evaluation by your heart doctor prior to initiating moderate to intense exercise programs.    One other important lifestyle recommendation is to ensure adequate sleep - at least 6-7 hours of uninterrupted sleep at night.  Procedures /Studies:   DG Chest Port 1 View  Result Date: 07/12/2021 CLINICAL DATA:  Severe shortness of breath and weakness for 1 day. Near syncopal episode. COPD. EXAM: PORTABLE CHEST 1 VIEW COMPARISON:  06/16/2021 FINDINGS: Left-sided power port remains in appropriate position. Heart size is stable. Marked pulmonary hyperinflation is again demonstrated. Coarse diffuse interstitial infiltrates are unchanged. No evidence of superimposed pulmonary airspace disease or pleural effusion. Lower thoracic spine vertebroplasties again noted. IMPRESSION: Stable COPD and chronic interstitial lung disease. No acute findings. Electronically Signed   By:  Marlaine Hind M.D.   On: 07/12/2021 14:16    Subjective:   Patient was seen and examined 07/16/2021, 10:55 AM Patient stable today. No acute distress.  No issues overnight Stable for discharge.  Discharge Exam:    Vitals:   07/15/21 1615 07/15/21 1713 07/15/21 2050 07/16/21 0503  BP: 109/60 119/64 (!) 99/57 (!) 100/54  Pulse: 64 61 67 68  Resp: _0 Temp: 97.8 F (36.6 C) (!) 97.4 F (36.3 C) (!) 97 F (36.1 C) (!) 97.1 F (36.2 C)  TempSrc:  Oral    SpO2: 98% 99% 98%  92%  Weight:      Height:        General: Pt lying comfortably in bed & appears in no obvious distress. Cardiovascular: S1 & S2 heard, RRR, S1/S2 +. No murmurs, rubs, gallops or clicks. No JVD or pedal edema. Respiratory: Clear to auscultation without wheezing, rhonchi or crackles. No increased work of breathing. Abdominal:  Non-distended, non-tender & soft. No organomegaly or masses appreciated. Normal bowel sounds heard. CNS: Alert and oriented. No focal deficits. Extremities: no edema, no cyanosis      The results of significant diagnostics from this hospitalization (including imaging, microbiology, ancillary and laboratory) are listed below for reference.      Microbiology:   Recent Results (from the past 240 hour(s))  Resp Panel by RT-PCR (Flu A&B, Covid) Nasopharyngeal Swab     Status: None   Collection Time: 07/12/21  4:05 PM   Specimen: Nasopharyngeal Swab; Nasopharyngeal(NP) swabs in vial transport medium  Result Value Ref Range Status   SARS Coronavirus 2 by RT PCR NEGATIVE NEGATIVE Final    Comment: (NOTE) SARS-CoV-2 target nucleic acids are NOT DETECTED.  The SARS-CoV-2 RNA is generally detectable in upper respiratory specimens during the acute phase of infection. The lowest concentration of SARS-CoV-2 viral copies this assay can detect is 138 copies/mL. A negative result does not preclude SARS-Cov-2 infection and should not be used as the sole basis for treatment or other  patient management decisions. A negative result may occur with  improper specimen collection/handling, submission of specimen other than nasopharyngeal swab, presence of viral mutation(s) within the areas targeted by this assay, and inadequate number of viral copies(<138 copies/mL). A negative result must be combined with clinical observations, patient history, and epidemiological information. The expected result is Negative.  Fact Sheet for Patients:  EntrepreneurPulse.com.au  Fact Sheet for Healthcare Providers:  IncredibleEmployment.be  This test is no t yet approved or cleared by the [place] FDA and  has been authorized for detection and/or diagnosis of SARS-CoV-2 by FDA under an Emergency Use Authorization (EUA). This EUA will remain  in effect (meaning this test can be used) for the duration of the COVID-19 declaration under Section 564(b)(1) of the Act, 21 U.S.C.section 360bbb-3(b)(1), unless the authorization is terminated  or revoked sooner.       Influenza A by PCR NEGATIVE NEGATIVE Final   Influenza B by PCR NEGATIVE NEGATIVE Final    Comment: (NOTE) The Xpert Xpress SARS-CoV-2/FLU/RSV plus assay is intended as an aid in the diagnosis of influenza from Nasopharyngeal swab specimens and should not be used as a sole basis for treatment. Nasal washings and aspirates are unacceptable for Xpert Xpress SARS-CoV-2/FLU/RSV testing.  Fact Sheet for Patients: EntrepreneurPulse.com.au  Fact Sheet for Healthcare Providers: IncredibleEmployment.be  This test is not yet approved or cleared by the [place] FDA and has been authorized for detection and/or diagnosis of SARS-CoV-2 by FDA under an Emergency Use Authorization (EUA). This EUA will remain in effect (meaning this test can be used) for the duration of the COVID-19 declaration under Section 564(b)(1) of the Act, 21 U.S.C. section  360bbb-3(b)(1), unless the authorization is terminated or revoked.  Performed at [facility], [address], [place], [place] [address]   MRSA Next Gen by PCR, Nasal     Status: None   Collection Time: 07/12/21  8:00 PM   Specimen: Nasal Mucosa; Nasal Swab  Result Value Ref Range Status   MRSA by PCR Next Gen NOT DETECTED NOT DETECTED Final  Comment: (NOTE) The GeneXpert MRSA Assay (FDA approved for NASAL specimens only), is one component of a comprehensive MRSA colonization surveillance program. It is not intended to diagnose MRSA infection nor to guide or monitor treatment for MRSA infections. Test performance is not FDA approved in patients less than 71 years old. Performed at [facility], [address] [place], [place] [address]      Labs:   CBC: Recent Labs  Lab 07/12/21 1405 07/13/21 5681 07/14/21 0629 07/15/21 0805 07/15/21 1831 07/16/21 0523  WBC 6.1 3.8* 8.3 6.8  --  7.2  NEUTROABS 4.3  --   --   --   --   --   HGB 5.3* 8.4* 7.3* 6.4* 9.9* 9.5*  HCT 16.6* 26.3* 22.0* 19.5* 29.7* 29.0*  MCV 86.0 88.3 88.7 89.0  --  87.1  PLT 41* 38* 34* 37*  --  39*   Basic Metabolic Panel: Recent Labs  Lab 07/12/21 1405 07/13/21 0614 07/14/21 0629 07/15/21 0523 07/16/21 0523  NA 138 136 136 136 134*  K 3.4* 4.3 3.6 3.5 3.5  CL 110 107 108 107 107  CO2 _0 21*  GLUCOSE 102* 110* 94 79 84  BUN 52* 59* 57* 48* 37*  CREATININE 1.57* 1.64* 1.63* 1.48* 1.38*  CALCIUM 9.3 8.7* 8.7* 8.5* 8.6*  MG  --   --  1.9  --   --    Liver Function Tests: Recent Labs  Lab 07/12/21 1405  AST 18  ALT 12  ALKPHOS 51  BILITOT 0.5  PROT 6.1*  ALBUMIN 2.6*   BNP (last 3 results) Recent Labs    02/15/21 1946 06/16/21 1135  BNP 231.0* 52.0   Cardiac Enzymes: No results for input(s): CKTOTAL, CKMB, CKMBINDEX, TROPONINI in the last 168 hours. CBG: No results for input(s): GLUCAP in the last 168 hours. Hgb A1c No results for input(s): HGBA1C in the last 72  hours. Lipid Profile No results for input(s): CHOL, HDL, LDLCALC, TRIG, CHOLHDL, LDLDIRECT in the last 72 hours. Thyroid function studies No results for input(s): TSH, T4TOTAL, T3FREE, THYROIDAB in the last 72 hours.  Invalid input(s): FREET3 Anemia work up Recent Labs    07/15/21 0523  VITAMINB12 1,383*  FOLATE 4.5*  FERRITIN 710*  TIBC 158*  IRON 107  RETICCTPCT 2.5   Urinalysis    Component Value Date/Time   COLORURINE YELLOW 06/16/2021 [place] 06/16/2021 0853   APPEARANCEUR Hazy (A) 03/26/2021 1334   LABSPEC 1.025 06/16/2021 0853   PHURINE 6.0 06/16/2021 0853   GLUCOSEU NEGATIVE 06/16/2021 0853   HGBUR TRACE (A) 06/16/2021 0853   BILIRUBINUR NEGATIVE 06/16/2021 0853   BILIRUBINUR Negative 03/26/2021 1334   KETONESUR NEGATIVE 06/16/2021 0853   PROTEINUR TRACE (A) 06/16/2021 0853   UROBILINOGEN 0.2 08/15/2013 1240   NITRITE NEGATIVE 06/16/2021 0853   LEUKOCYTESUR NEGATIVE 06/16/2021 0853         Time coordinating discharge: Over 45 minutes  SIGNED: Deatra James, MD, FACP, FHM. [facility],  Please use [email or web address] to Page If 7PM-7AM, please contact night-coverage [email or web address],  07/16/2021, 10:55 AM

## 2021-07-16 NOTE — Discharge Planning (Signed)
Oncology Discharge Planning Note ? ?[facility] [facility] ?Address: [address] [place], [place] [address] ?Hours of Operation:  8am - 5pm, Monday - Friday  ?Clinic Contact Information:  [phone] ? ?Oncology Care Team: ?Medical Oncologist:  Derek Jack ? ?Patient Details: ?Name:  [name], [name] ?MRN:   [id number] ?DOB:   09-16-1929 ?Reason for Current Admission: Acute on chronic anemia ? ?Discharge Planning Narrative: ?Discharge follow-up appointments for oncology are current and available on the AVS and MyChart.   ?Upon discharge from the hospital, hematology/oncology's post discharge plan of care for the outpatient setting is: 07/21/21 for labs and possible blood products.  Will schedule provider appt if needed prior to discharge.  ? ?Friedrich Harriott will be called within two business days after discharge to review hematology/oncology's plan of care for full understanding.   ? ?Outpatient Oncology Specific Care Only: ?Oncology appointment transportation needs addressed?:  not applicable ?Oncology medication management for symptom management addressed?:  not applicable ?Chemo Alert Card reviewed?:  not applicable ?Immunotherapy Alert Card reviewed?:  not applicable ? ?

## 2021-07-16 NOTE — Care Management Important Message (Signed)
Important Message ? ?Patient Details  ?Name: [name] ?MRN: [id number] ?Date of Birth: June 02, 1929 ? ? ?Medicare Important Message Given:  N/A - LOS <3 / Initial given by admissions ? ? ? ? ?[name] ?07/16/2021, 11:25 AM ?

## 2021-07-16 NOTE — Assessment & Plan Note (Signed)
Stable encouraging oral intake, dietary supplements ?

## 2021-07-16 NOTE — Plan of Care (Signed)

## 2021-07-16 NOTE — Progress Notes (Signed)
AVS given and explained to patient. Port deaccessed and heparin locked. Patient awaiting for ride. ?

## 2021-07-17 ENCOUNTER — Telehealth: Payer: Self-pay | Admitting: *Deleted

## 2021-07-17 ENCOUNTER — Other Ambulatory Visit (INDEPENDENT_AMBULATORY_CARE_PROVIDER_SITE_OTHER): Payer: Self-pay

## 2021-07-17 ENCOUNTER — Encounter (HOSPITAL_COMMUNITY): Payer: Self-pay | Admitting: Internal Medicine

## 2021-07-17 ENCOUNTER — Telehealth (INDEPENDENT_AMBULATORY_CARE_PROVIDER_SITE_OTHER): Payer: Self-pay | Admitting: Gastroenterology

## 2021-07-17 DIAGNOSIS — D649 Anemia, unspecified: Secondary | ICD-10-CM

## 2021-07-17 LAB — METHYLMALONIC ACID, SERUM: Methylmalonic Acid, Quantitative: 524 nmol/L — ABNORMAL HIGH (ref 0–378)

## 2021-07-17 NOTE — Telephone Encounter (Signed)
Patient aware he has an office visit set up for 07/31/2021 with [facility]. He is aware ot have his labs drawn on 07/23/2021 at [facility] lab. I have placed the orders in Epic. ?

## 2021-07-17 NOTE — Telephone Encounter (Signed)
[name] was contacted by telephone to verify understanding of discharge instructions status post their most recent discharge from the hospital on the date:  07/16/21.  Inpatient discharge AVS was re-reviewed with patient, along with cancer center appointments.  Verification of understanding for oncology specific follow-up was validated using the Teach Back method.  States he is sore from his procedures and is not feeling the best today, however will be at his appointment with [place] on Monday.  Offered HH referral, which he declined.  Advised him to let us know as soon as possible if he needs assistance with transportation.  Plans to drive himself at this point. ? ?Transportation to appointments were confirmed for the patient as being self/caregiver. ? ?[name]?s questions were addressed to their satisfaction upon completion of this post discharge follow-up call for outpatient oncology.  ?

## 2021-07-18 LAB — SURGICAL PATHOLOGY

## 2021-07-21 ENCOUNTER — Emergency Department (HOSPITAL_COMMUNITY): Payer: Medicare Other

## 2021-07-21 ENCOUNTER — Other Ambulatory Visit: Payer: Self-pay

## 2021-07-21 ENCOUNTER — Ambulatory Visit (HOSPITAL_COMMUNITY): Payer: Medicare Other | Admitting: Hematology

## 2021-07-21 ENCOUNTER — Inpatient Hospital Stay (HOSPITAL_COMMUNITY): Payer: Medicare Other

## 2021-07-21 ENCOUNTER — Encounter (HOSPITAL_COMMUNITY): Payer: Self-pay | Admitting: Emergency Medicine

## 2021-07-21 ENCOUNTER — Encounter (HOSPITAL_COMMUNITY): Payer: Medicare Other

## 2021-07-21 ENCOUNTER — Inpatient Hospital Stay (HOSPITAL_COMMUNITY)
Admission: EM | Admit: 2021-07-21 | Discharge: 2021-07-26 | DRG: 393 | Disposition: A | Discharge status: Home / Self Care | Payer: Medicare Other | Attending: Internal Medicine | Admitting: Internal Medicine

## 2021-07-21 ENCOUNTER — Other Ambulatory Visit (HOSPITAL_COMMUNITY): Payer: Medicare Other

## 2021-07-21 ENCOUNTER — Ambulatory Visit (HOSPITAL_COMMUNITY): Payer: Medicare Other

## 2021-07-21 ENCOUNTER — Encounter (HOSPITAL_COMMUNITY): Payer: Medicare Other | Admitting: Dietician

## 2021-07-21 DIAGNOSIS — E872 Acidosis, unspecified: Secondary | ICD-10-CM | POA: Diagnosis present

## 2021-07-21 DIAGNOSIS — E861 Hypovolemia: Secondary | ICD-10-CM | POA: Diagnosis present

## 2021-07-21 DIAGNOSIS — Z20822 Contact with and (suspected) exposure to covid-19: Secondary | ICD-10-CM | POA: Diagnosis present

## 2021-07-21 DIAGNOSIS — J439 Emphysema, unspecified: Secondary | ICD-10-CM | POA: Diagnosis not present

## 2021-07-21 DIAGNOSIS — K921 Melena: Secondary | ICD-10-CM | POA: Diagnosis not present

## 2021-07-21 DIAGNOSIS — I959 Hypotension, unspecified: Secondary | ICD-10-CM | POA: Diagnosis present

## 2021-07-21 DIAGNOSIS — K2901 Acute gastritis with bleeding: Secondary | ICD-10-CM | POA: Diagnosis not present

## 2021-07-21 DIAGNOSIS — J9811 Atelectasis: Secondary | ICD-10-CM | POA: Diagnosis not present

## 2021-07-21 DIAGNOSIS — Z79899 Other long term (current) drug therapy: Secondary | ICD-10-CM | POA: Diagnosis not present

## 2021-07-21 DIAGNOSIS — D6959 Other secondary thrombocytopenia: Secondary | ICD-10-CM | POA: Diagnosis present

## 2021-07-21 DIAGNOSIS — N1832 Chronic kidney disease, stage 3b: Secondary | ICD-10-CM | POA: Diagnosis present

## 2021-07-21 DIAGNOSIS — Z9981 Dependence on supplemental oxygen: Secondary | ICD-10-CM | POA: Diagnosis not present

## 2021-07-21 DIAGNOSIS — R195 Other fecal abnormalities: Secondary | ICD-10-CM | POA: Diagnosis not present

## 2021-07-21 DIAGNOSIS — K2980 Duodenitis without bleeding: Secondary | ICD-10-CM | POA: Diagnosis present

## 2021-07-21 DIAGNOSIS — Z87891 Personal history of nicotine dependence: Secondary | ICD-10-CM | POA: Diagnosis not present

## 2021-07-21 DIAGNOSIS — R069 Unspecified abnormalities of breathing: Secondary | ICD-10-CM | POA: Diagnosis not present

## 2021-07-21 DIAGNOSIS — N179 Acute kidney failure, unspecified: Secondary | ICD-10-CM | POA: Diagnosis not present

## 2021-07-21 DIAGNOSIS — D62 Acute posthemorrhagic anemia: Secondary | ICD-10-CM | POA: Diagnosis not present

## 2021-07-21 DIAGNOSIS — E876 Hypokalemia: Secondary | ICD-10-CM | POA: Diagnosis not present

## 2021-07-21 DIAGNOSIS — K922 Gastrointestinal hemorrhage, unspecified: Secondary | ICD-10-CM | POA: Diagnosis not present

## 2021-07-21 DIAGNOSIS — K219 Gastro-esophageal reflux disease without esophagitis: Secondary | ICD-10-CM | POA: Diagnosis present

## 2021-07-21 DIAGNOSIS — K2289 Other specified disease of esophagus: Secondary | ICD-10-CM | POA: Diagnosis not present

## 2021-07-21 DIAGNOSIS — D469 Myelodysplastic syndrome, unspecified: Secondary | ICD-10-CM | POA: Diagnosis present

## 2021-07-21 DIAGNOSIS — R0602 Shortness of breath: Secondary | ICD-10-CM

## 2021-07-21 DIAGNOSIS — C419 Malignant neoplasm of bone and articular cartilage, unspecified: Secondary | ICD-10-CM | POA: Diagnosis present

## 2021-07-21 DIAGNOSIS — Z681 Body mass index (BMI) 19 or less, adult: Secondary | ICD-10-CM

## 2021-07-21 DIAGNOSIS — K2970 Gastritis, unspecified, without bleeding: Secondary | ICD-10-CM | POA: Diagnosis not present

## 2021-07-21 DIAGNOSIS — N189 Chronic kidney disease, unspecified: Secondary | ICD-10-CM | POA: Diagnosis not present

## 2021-07-21 DIAGNOSIS — E440 Moderate protein-calorie malnutrition: Secondary | ICD-10-CM | POA: Diagnosis not present

## 2021-07-21 DIAGNOSIS — K2991 Gastroduodenitis, unspecified, with bleeding: Secondary | ICD-10-CM | POA: Diagnosis not present

## 2021-07-21 DIAGNOSIS — D649 Anemia, unspecified: Secondary | ICD-10-CM

## 2021-07-21 DIAGNOSIS — J9621 Acute and chronic respiratory failure with hypoxia: Secondary | ICD-10-CM

## 2021-07-21 DIAGNOSIS — K6381 Dieulafoy lesion of intestine: Secondary | ICD-10-CM | POA: Diagnosis present

## 2021-07-21 DIAGNOSIS — E46 Unspecified protein-calorie malnutrition: Secondary | ICD-10-CM | POA: Diagnosis present

## 2021-07-21 DIAGNOSIS — H532 Diplopia: Secondary | ICD-10-CM | POA: Diagnosis present

## 2021-07-21 DIAGNOSIS — Q2733 Arteriovenous malformation of digestive system vessel: Secondary | ICD-10-CM

## 2021-07-21 DIAGNOSIS — D696 Thrombocytopenia, unspecified: Secondary | ICD-10-CM | POA: Diagnosis present

## 2021-07-21 DIAGNOSIS — J449 Chronic obstructive pulmonary disease, unspecified: Secondary | ICD-10-CM | POA: Diagnosis present

## 2021-07-21 DIAGNOSIS — Z841 Family history of disorders of kidney and ureter: Secondary | ICD-10-CM

## 2021-07-21 DIAGNOSIS — Z515 Encounter for palliative care: Secondary | ICD-10-CM

## 2021-07-21 DIAGNOSIS — Z7189 Other specified counseling: Secondary | ICD-10-CM | POA: Diagnosis not present

## 2021-07-21 DIAGNOSIS — K2990 Gastroduodenitis, unspecified, without bleeding: Secondary | ICD-10-CM | POA: Diagnosis not present

## 2021-07-21 DIAGNOSIS — N183 Chronic kidney disease, stage 3 unspecified: Secondary | ICD-10-CM | POA: Diagnosis present

## 2021-07-21 DIAGNOSIS — D631 Anemia in chronic kidney disease: Secondary | ICD-10-CM | POA: Diagnosis not present

## 2021-07-21 LAB — COMPREHENSIVE METABOLIC PANEL
ALT: 15 U/L (ref 0–44)
AST: 18 U/L (ref 15–41)
Albumin: 2.5 g/dL — ABNORMAL LOW (ref 3.5–5.0)
Alkaline Phosphatase: 50 U/L (ref 38–126)
Anion gap: 7 (ref 5–15)
BUN: 66 mg/dL — ABNORMAL HIGH (ref 8–23)
CO2: 22 mmol/L (ref 22–32)
Calcium: 8.7 mg/dL — ABNORMAL LOW (ref 8.9–10.3)
Chloride: 108 mmol/L (ref 98–111)
Creatinine, Ser: 1.89 mg/dL — ABNORMAL HIGH (ref 0.61–1.24)
GFR, Estimated: 34 mL/min — ABNORMAL LOW (ref >60)
Glucose, Bld: 113 mg/dL — ABNORMAL HIGH (ref 70–99)
Potassium: 3.4 mmol/L — ABNORMAL LOW (ref 3.5–5.1)
Sodium: 137 mmol/L (ref 135–145)
Total Bilirubin: 0.5 mg/dL (ref 0.3–1.2)
Total Protein: 5.8 g/dL — ABNORMAL LOW (ref 6.5–8.1)

## 2021-07-21 LAB — PROTIME-INR
INR: 1.1 (ref 0.8–1.2)
Prothrombin Time: 14.4 seconds (ref 11.4–15.2)

## 2021-07-21 LAB — CBC WITH DIFFERENTIAL/PLATELET

## 2021-07-21 LAB — POC OCCULT BLOOD, ED: Fecal Occult Bld: POSITIVE — AB

## 2021-07-21 LAB — APTT: aPTT: 34 seconds (ref 24–36)

## 2021-07-21 LAB — PREPARE RBC (CROSSMATCH)

## 2021-07-21 MED ORDER — PANTOPRAZOLE SODIUM 40 MG IV SOLR
40.0000 mg | Freq: Two times a day (BID) | INTRAVENOUS | Status: DC
  Filled 2021-07-21: qty 10

## 2021-07-21 MED ORDER — SODIUM CHLORIDE 0.9 % IV BOLUS (SEPSIS)
250.0000 mL | Freq: Once | INTRAVENOUS | Status: AC
  Administered 2021-07-22: 250 mL via INTRAVENOUS

## 2021-07-21 MED ORDER — ALBUTEROL SULFATE HFA 108 (90 BASE) MCG/ACT IN AERS: 2.0000 | INHALATION_SPRAY | RESPIRATORY_TRACT | Status: DC | PRN

## 2021-07-21 MED ORDER — SODIUM CHLORIDE 0.9 % IV BOLUS (SEPSIS)
1000.0000 mL | Freq: Once | INTRAVENOUS | Status: AC
  Administered 2021-07-21: 1000 mL via INTRAVENOUS

## 2021-07-21 MED ORDER — SODIUM CHLORIDE 0.9 % IV SOLN: 10.0000 mL/h | Freq: Once | INTRAVENOUS | Status: DC

## 2021-07-21 MED ORDER — SODIUM CHLORIDE 0.9 % IV BOLUS (SEPSIS)
500.0000 mL | Freq: Once | INTRAVENOUS | Status: AC
  Administered 2021-07-22: 500 mL via INTRAVENOUS

## 2021-07-21 MED ORDER — LACTATED RINGERS IV SOLN: INTRAVENOUS | Status: DC

## 2021-07-21 MED ORDER — PANTOPRAZOLE INFUSION (NEW) - SIMPLE MED
8.0000 mg/h | INTRAVENOUS | Status: DC
  Administered 2021-07-22: 8 mg/h via INTRAVENOUS
  Filled 2021-07-21 (×3): qty 100

## 2021-07-21 MED ORDER — PANTOPRAZOLE 80MG IVPB - SIMPLE MED
80.0000 mg | Freq: Once | INTRAVENOUS | Status: AC
  Administered 2021-07-22: 80 mg via INTRAVENOUS
  Filled 2021-07-21: qty 100

## 2021-07-21 NOTE — ED Provider Notes (Signed)
[facility] Provider Note   CSN: [id number] Arrival date & time: 07/21/21  2226     History  Chief Complaint  Patient presents with   Shortness of Breath    [name] is a 88 y.o. male.  Patient presents to the emergency department for evaluation of shortness of breath.  Patient reports that he has been very short of breath all day long.  Patient has a history of COPD, chronically on oxygen.  Patient denies associated fever, cough, shortness of breath, swelling.      Home Medications Prior to Admission medications   Medication Sig Start Date End Date Taking? Authorizing Provider  albuterol (VENTOLIN HFA) 108 (90 Base) MCG/ACT inhaler Inhale 2 puffs into the lungs every 4 (four) hours as needed for wheezing or shortness of breath. 04/04/20   Roxan Hockey, MD  midodrine (PROAMATINE) 2.5 MG tablet Take 1 tablet (2.5 mg total) by mouth 3 (three) times daily with meals. 06/23/21   Derek Jack, MD  pantoprazole (PROTONIX) 40 MG tablet Take 1 tablet (40 mg total) by mouth 2 (two) times daily. 07/16/21 07/16/22  ShahmehdiValeria Batman, MD  silodosin (RAPAFLO) 4 MG CAPS capsule Take 4 mg by mouth at bedtime. 06/27/21   [provider]      Allergies    Patient has no known allergies.    Review of Systems   Review of Systems  Respiratory:  Positive for shortness of breath.    Physical Exam Updated Vital Signs BP (!) 84/59 (BP Location: Left Arm)    Pulse 97    Temp 97.7 F (36.5 C) (Oral)    Resp 18    Ht 6' (1.829 m)    Wt 57 kg    SpO2 (!) 87%    BMI 17.04 kg/m  Physical Exam Vitals and nursing note reviewed.  Constitutional:      General: He is not in acute distress.    Appearance: He is well-developed.  HENT:     Head: Normocephalic and atraumatic.     Mouth/Throat:     Mouth: Mucous membranes are moist.  Eyes:     General: Vision grossly intact. Gaze aligned appropriately.     Extraocular Movements: Extraocular movements intact.      Conjunctiva/sclera: Conjunctivae normal.  Cardiovascular:     Rate and Rhythm: Normal rate and regular rhythm.     Pulses: Normal pulses.     Heart sounds: Normal heart sounds, S1 normal and S2 normal. No murmur heard.   No friction rub. No gallop.  Pulmonary:     Effort: Pulmonary effort is normal. No respiratory distress.     Breath sounds: Normal breath sounds.  Abdominal:     Palpations: Abdomen is soft.     Tenderness: There is no abdominal tenderness. There is no guarding or rebound.     Hernia: No hernia is present.  Genitourinary:    Rectum: Guaiac result positive (black stool).  Musculoskeletal:        General: No swelling.     Cervical back: Full passive range of motion without pain, normal range of motion and neck supple. No pain with movement, spinous process tenderness or muscular tenderness. Normal range of motion.     Right lower leg: No edema.     Left lower leg: No edema.  Skin:    General: Skin is warm and dry.     Capillary Refill: Capillary refill takes less than 2 seconds.     Coloration:  Skin is pale.     Findings: No ecchymosis, erythema, lesion or wound.  Neurological:     Mental Status: He is alert and oriented to person, place, and time.     GCS: GCS eye subscore is 4. GCS verbal subscore is 5. GCS motor subscore is 6.     Cranial Nerves: Cranial nerves 2-12 are intact.     Sensory: Sensation is intact.     Motor: Motor function is intact. No weakness or abnormal muscle tone.     Coordination: Coordination is intact.  Psychiatric:        Mood and Affect: Mood normal.        Speech: Speech normal.        Behavior: Behavior normal.    ED Results / Procedures / Treatments   Labs (all labs ordered are listed, but only abnormal results are displayed) Labs Reviewed  COMPREHENSIVE METABOLIC PANEL - Abnormal; Notable for the following components:      Result Value   Potassium 3.4 (*)    Glucose, Bld 113 (*)    BUN 66 (*)    Creatinine, Ser 1.89  (*)    Calcium 8.7 (*)    Total Protein 5.8 (*)    Albumin 2.5 (*)    GFR, Estimated 34 (*)    All other components within normal limits  CBC WITH DIFFERENTIAL/PLATELET - Abnormal; Notable for the following components:   WBC 11.0 (*)    nRBC 1.0 (*)    All other components within normal limits  POC OCCULT BLOOD, ED - Abnormal; Notable for the following components:   Fecal Occult Bld POSITIVE (*)    All other components within normal limits  CULTURE, BLOOD (ROUTINE X 2)  CULTURE, BLOOD (ROUTINE X 2)  RESP PANEL BY RT-PCR (FLU A&B, COVID) ARPGX2  PROTIME-INR  APTT  LACTIC ACID, PLASMA  LACTIC ACID, PLASMA  URINALYSIS, ROUTINE W REFLEX MICROSCOPIC  TYPE AND SCREEN  PREPARE RBC (CROSSMATCH)    EKG EKG Interpretation  Date/Time:  Monday July 21 2021 22:36:20 EST Ventricular Rate:  87 PR Interval:  145 QRS Duration: 100 QT Interval:  370 QTC Calculation: 446 R Axis:   82 Text Interpretation: Sinus rhythm Anteroseptal infarct, age indeterminate No significant change since No significant change since last tracing Confirmed by Orpah Greek [phone]) on 07/21/2021 11:23:12 PM  Radiology DG Chest Port 1 View  Result Date: 07/21/2021 CLINICAL DATA:  Shortness of breath. EXAM: PORTABLE CHEST 1 VIEW COMPARISON:  July 12, 2021 FINDINGS: There is stable left-sided venous Port-A-Cath positioning. The lungs are hyperinflated. There is marked severity emphysematous lung disease with stable chronic, diffusely increased interstitial lung markings. Mild, stable atelectasis is seen within the bilateral lung bases. There is no evidence of a pleural effusion or pneumothorax. The heart size and mediastinal contours are within normal limits. Prior vertebroplasty is seen within the mid to lower thoracic spine and upper lumbar spine. IMPRESSION: Stable exam, without acute cardiopulmonary disease. Electronically Signed   By: Virgina Norfolk M.D.   On: 07/21/2021 23:21     Procedures Procedures    Medications Ordered in ED Medications  albuterol (VENTOLIN HFA) 108 (90 Base) MCG/ACT inhaler 2 puff (has no administration in time range)  lactated ringers infusion (has no administration in time range)  sodium chloride 0.9 % bolus 1,000 mL (1,000 mLs Intravenous New Bag/Given 07/21/21 2313)    And  sodium chloride 0.9 % bolus 500 mL (has no administration in time range)  And  sodium chloride 0.9 % bolus 250 mL (has no administration in time range)  0.9 %  sodium chloride infusion (has no administration in time range)  pantoprazole (PROTONIX) 80 mg /NS 100 mL IVPB (has no administration in time range)  pantoprozole (PROTONIX) 80 mg /NS 100 mL infusion (has no administration in time range)  pantoprazole (PROTONIX) injection 40 mg (has no administration in time range)    ED Course/ Medical Decision Making/ A&P                           Medical Decision Making Amount and/or Complexity of Data Reviewed Labs: ordered.  Risk Prescription drug management.   Patient presents to the emergency department for evaluation of shortness of breath.  Frontal diagnosis considered: COPD laceration, congestive heart failure, pneumonia, sepsis, symptomatic anemia  Previous records reviewed.  Patient recently hospitalized for symptomatic anemia secondary to esophagitis and bleeding gastritis.  Required blood transfusion at that time.  Patient very anemic at arrival.  He is hypotensive, likely multifactorial.  He does have a history of hypotension and takes midodrine daily.  He did, however, improved with IV fluid bolus administration.  Patient hypoxic at arrival.  Is normal baseline is requiring 3 L by nasal cannula.  He has an increased oxygen demand.  Chest x-ray performed at arrival, independently viewed and interpreted by myself, with chronic interstitial changes.  No pneumonia.  No acute findings.  Patient's hemoglobin is 5.3.  Rectal exam revealed black, melanotic  stools that are heme positive.  Patient experiencing symptomatic anemia, likely secondary to continued GI bleed from gastritis.  Hypoxia likely secondary to his anemia and chronic lung disease.  Patient initiated on Protonix bolus and drip.  Packed red blood cell transfusion ordered.  Will require hospitalization for further management of acute on chronic anemia secondary to GI bleed.  CRITICAL CARE Performed by: Orpah Greek   Total critical care time: 33 minutes  Critical care time was exclusive of separately billable procedures and treating other patients.  Critical care was necessary to treat or prevent imminent or life-threatening deterioration.  Critical care was time spent personally by me on the following activities: development of treatment plan with patient and/or surrogate as well as nursing, discussions with consultants, evaluation of patient's response to treatment, examination of patient, obtaining history from patient or surrogate, ordering and performing treatments and interventions, ordering and review of laboratory studies, ordering and review of radiographic studies, pulse oximetry and re-evaluation of patient's condition.         Final Clinical Impression(s) / ED Diagnoses Final diagnoses:  Acute on chronic respiratory failure with hypoxia (HCC)  Gastrointestinal hemorrhage associated with acute gastritis  Symptomatic anemia    Rx / DC Orders ED Discharge Orders     None         Orpah Greek, MD 07/21/21 2352

## 2021-07-21 NOTE — Progress Notes (Signed)
Sepsis tracking by eLINK 

## 2021-07-21 NOTE — Progress Notes (Signed)
Call for pt in respiratory distress pt vomitting when he came in. Pt on 100% nrb spo2 96-100%  ?

## 2021-07-21 NOTE — ED Triage Notes (Signed)
Per ems pt became more sob today. On their arrival pt was 80-90% on 3-4 lpm. Pt is on NBR at this time.  ?

## 2021-07-22 ENCOUNTER — Encounter (HOSPITAL_COMMUNITY): Payer: Self-pay | Admitting: Family Medicine

## 2021-07-22 ENCOUNTER — Ambulatory Visit (HOSPITAL_COMMUNITY): Payer: Medicare Other

## 2021-07-22 DIAGNOSIS — D649 Anemia, unspecified: Secondary | ICD-10-CM | POA: Diagnosis not present

## 2021-07-22 DIAGNOSIS — R0602 Shortness of breath: Secondary | ICD-10-CM | POA: Diagnosis present

## 2021-07-22 DIAGNOSIS — I959 Hypotension, unspecified: Secondary | ICD-10-CM | POA: Diagnosis present

## 2021-07-22 DIAGNOSIS — E440 Moderate protein-calorie malnutrition: Secondary | ICD-10-CM

## 2021-07-22 DIAGNOSIS — N189 Chronic kidney disease, unspecified: Secondary | ICD-10-CM | POA: Diagnosis not present

## 2021-07-22 DIAGNOSIS — N179 Acute kidney failure, unspecified: Secondary | ICD-10-CM | POA: Diagnosis not present

## 2021-07-22 DIAGNOSIS — Z515 Encounter for palliative care: Secondary | ICD-10-CM | POA: Diagnosis not present

## 2021-07-22 DIAGNOSIS — Z87891 Personal history of nicotine dependence: Secondary | ICD-10-CM | POA: Diagnosis not present

## 2021-07-22 DIAGNOSIS — K921 Melena: Secondary | ICD-10-CM | POA: Diagnosis not present

## 2021-07-22 DIAGNOSIS — D696 Thrombocytopenia, unspecified: Secondary | ICD-10-CM | POA: Diagnosis not present

## 2021-07-22 DIAGNOSIS — C419 Malignant neoplasm of bone and articular cartilage, unspecified: Secondary | ICD-10-CM | POA: Diagnosis present

## 2021-07-22 DIAGNOSIS — K2970 Gastritis, unspecified, without bleeding: Secondary | ICD-10-CM | POA: Diagnosis present

## 2021-07-22 DIAGNOSIS — H532 Diplopia: Secondary | ICD-10-CM | POA: Diagnosis present

## 2021-07-22 DIAGNOSIS — Z841 Family history of disorders of kidney and ureter: Secondary | ICD-10-CM | POA: Diagnosis not present

## 2021-07-22 DIAGNOSIS — E876 Hypokalemia: Secondary | ICD-10-CM | POA: Diagnosis present

## 2021-07-22 DIAGNOSIS — K219 Gastro-esophageal reflux disease without esophagitis: Secondary | ICD-10-CM | POA: Diagnosis present

## 2021-07-22 DIAGNOSIS — D631 Anemia in chronic kidney disease: Secondary | ICD-10-CM | POA: Diagnosis not present

## 2021-07-22 DIAGNOSIS — J449 Chronic obstructive pulmonary disease, unspecified: Secondary | ICD-10-CM

## 2021-07-22 DIAGNOSIS — D62 Acute posthemorrhagic anemia: Secondary | ICD-10-CM | POA: Diagnosis present

## 2021-07-22 DIAGNOSIS — Z79899 Other long term (current) drug therapy: Secondary | ICD-10-CM | POA: Diagnosis not present

## 2021-07-22 DIAGNOSIS — K2990 Gastroduodenitis, unspecified, without bleeding: Secondary | ICD-10-CM | POA: Diagnosis not present

## 2021-07-22 DIAGNOSIS — Z20822 Contact with and (suspected) exposure to covid-19: Secondary | ICD-10-CM | POA: Diagnosis present

## 2021-07-22 DIAGNOSIS — N1832 Chronic kidney disease, stage 3b: Secondary | ICD-10-CM | POA: Diagnosis present

## 2021-07-22 DIAGNOSIS — J9621 Acute and chronic respiratory failure with hypoxia: Principal | ICD-10-CM | POA: Diagnosis present

## 2021-07-22 DIAGNOSIS — K6381 Dieulafoy lesion of intestine: Secondary | ICD-10-CM | POA: Diagnosis present

## 2021-07-22 DIAGNOSIS — E872 Acidosis, unspecified: Secondary | ICD-10-CM | POA: Diagnosis present

## 2021-07-22 DIAGNOSIS — K2289 Other specified disease of esophagus: Secondary | ICD-10-CM | POA: Diagnosis not present

## 2021-07-22 DIAGNOSIS — Z9981 Dependence on supplemental oxygen: Secondary | ICD-10-CM | POA: Diagnosis not present

## 2021-07-22 DIAGNOSIS — K2980 Duodenitis without bleeding: Secondary | ICD-10-CM | POA: Diagnosis present

## 2021-07-22 DIAGNOSIS — Z7189 Other specified counseling: Secondary | ICD-10-CM | POA: Diagnosis not present

## 2021-07-22 DIAGNOSIS — D6959 Other secondary thrombocytopenia: Secondary | ICD-10-CM | POA: Diagnosis present

## 2021-07-22 DIAGNOSIS — D469 Myelodysplastic syndrome, unspecified: Secondary | ICD-10-CM | POA: Diagnosis present

## 2021-07-22 DIAGNOSIS — E861 Hypovolemia: Secondary | ICD-10-CM | POA: Diagnosis present

## 2021-07-22 DIAGNOSIS — Z681 Body mass index (BMI) 19 or less, adult: Secondary | ICD-10-CM | POA: Diagnosis not present

## 2021-07-22 DIAGNOSIS — R195 Other fecal abnormalities: Secondary | ICD-10-CM | POA: Diagnosis not present

## 2021-07-22 DIAGNOSIS — K2901 Acute gastritis with bleeding: Secondary | ICD-10-CM | POA: Diagnosis not present

## 2021-07-22 DIAGNOSIS — K922 Gastrointestinal hemorrhage, unspecified: Secondary | ICD-10-CM | POA: Diagnosis not present

## 2021-07-22 DIAGNOSIS — K2991 Gastroduodenitis, unspecified, with bleeding: Secondary | ICD-10-CM | POA: Diagnosis not present

## 2021-07-22 LAB — CBC WITH DIFFERENTIAL/PLATELET
Basophils Absolute: 0 10*3/uL (ref 0.0–0.1)
Basophils Relative: 0 %
Blasts: 5 %
Eosinophils Absolute: 0.2 10*3/uL (ref 0.0–0.5)
Eosinophils Relative: 2 %
HCT: 17.4 % — ABNORMAL LOW (ref 39.0–52.0)
Hemoglobin: 5.4 g/dL — CL (ref 13.0–17.0)
Lymphocytes Relative: 14 %
Lymphs Abs: 1.5 10*3/uL (ref 0.7–4.0)
MCH: 26.9 pg (ref 26.0–34.0)
MCHC: 31 g/dL (ref 30.0–36.0)
MCV: 86.6 fL (ref 80.0–100.0)
Metamyelocytes Relative: 3 %
Monocytes Absolute: 0.8 10*3/uL (ref 0.1–1.0)
Monocytes Relative: 7 %
Myelocytes: 3 %
Neutro Abs: 7.2 10*3/uL (ref 1.7–7.7)
Neutrophils Relative %: 65 %
Platelets: 45 10*3/uL — ABNORMAL LOW (ref 150–400)
Promyelocytes Relative: 1 %
RBC: 2.01 MIL/uL — ABNORMAL LOW (ref 4.22–5.81)
RDW: 17.7 % — ABNORMAL HIGH (ref 11.5–15.5)
WBC: 11 10*3/uL — ABNORMAL HIGH (ref 4.0–10.5)
nRBC: 1 % — ABNORMAL HIGH (ref 0.0–0.2)
nRBC: 4 /100 WBC — ABNORMAL HIGH

## 2021-07-22 LAB — CBC
HCT: 21.8 % — ABNORMAL LOW (ref 39.0–52.0)
HCT: 22.6 % — ABNORMAL LOW (ref 39.0–52.0)
Hemoglobin: 7.1 g/dL — ABNORMAL LOW (ref 13.0–17.0)
Hemoglobin: 7.2 g/dL — ABNORMAL LOW (ref 13.0–17.0)
MCH: 28.5 pg (ref 26.0–34.0)
MCH: 27.8 pg (ref 26.0–34.0)
MCHC: 32.6 g/dL (ref 30.0–36.0)
MCHC: 31.9 g/dL (ref 30.0–36.0)
MCV: 87.6 fL (ref 80.0–100.0)
MCV: 87.3 fL (ref 80.0–100.0)
Platelets: 29 10*3/uL — CL (ref 150–400)
Platelets: 64 10*3/uL — ABNORMAL LOW (ref 150–400)
RBC: 2.49 MIL/uL — ABNORMAL LOW (ref 4.22–5.81)
RBC: 2.59 MIL/uL — ABNORMAL LOW (ref 4.22–5.81)
RDW: 17 % — ABNORMAL HIGH (ref 11.5–15.5)
RDW: 17 % — ABNORMAL HIGH (ref 11.5–15.5)
WBC: 6.4 10*3/uL (ref 4.0–10.5)
WBC: 10.9 10*3/uL — ABNORMAL HIGH (ref 4.0–10.5)
nRBC: 0.8 % — ABNORMAL HIGH (ref 0.0–0.2)
nRBC: 0.8 % — ABNORMAL HIGH (ref 0.0–0.2)

## 2021-07-22 LAB — COMPREHENSIVE METABOLIC PANEL
ALT: 14 U/L (ref 0–44)
AST: 17 U/L (ref 15–41)
Albumin: 2.1 g/dL — ABNORMAL LOW (ref 3.5–5.0)
Alkaline Phosphatase: 41 U/L (ref 38–126)
Anion gap: 4 — ABNORMAL LOW (ref 5–15)
BUN: 60 mg/dL — ABNORMAL HIGH (ref 8–23)
CO2: 19 mmol/L — ABNORMAL LOW (ref 22–32)
Calcium: 8.2 mg/dL — ABNORMAL LOW (ref 8.9–10.3)
Chloride: 116 mmol/L — ABNORMAL HIGH (ref 98–111)
Creatinine, Ser: 1.57 mg/dL — ABNORMAL HIGH (ref 0.61–1.24)
GFR, Estimated: 42 mL/min — ABNORMAL LOW (ref >60)
Glucose, Bld: 92 mg/dL (ref 70–99)
Potassium: 3.9 mmol/L (ref 3.5–5.1)
Sodium: 139 mmol/L (ref 135–145)
Total Bilirubin: 0.5 mg/dL (ref 0.3–1.2)
Total Protein: 4.6 g/dL — ABNORMAL LOW (ref 6.5–8.1)

## 2021-07-22 LAB — URINALYSIS, ROUTINE W REFLEX MICROSCOPIC
Bacteria, UA: NONE SEEN
Bilirubin Urine: NEGATIVE
Glucose, UA: NEGATIVE mg/dL
Ketones, ur: NEGATIVE mg/dL
Leukocytes,Ua: NEGATIVE
Nitrite: NEGATIVE
Protein, ur: NEGATIVE mg/dL
Specific Gravity, Urine: 1.012 (ref 1.005–1.030)
pH: 5 (ref 5.0–8.0)

## 2021-07-22 LAB — MAGNESIUM: Magnesium: 1.8 mg/dL (ref 1.7–2.4)

## 2021-07-22 LAB — LACTIC ACID, PLASMA
Lactic Acid, Venous: 2.3 mmol/L (ref 0.5–1.9)
Lactic Acid, Venous: 1.4 mmol/L (ref 0.5–1.9)

## 2021-07-22 LAB — RESP PANEL BY RT-PCR (FLU A&B, COVID) ARPGX2
Influenza A by PCR: NEGATIVE
Influenza B by PCR: NEGATIVE
SARS Coronavirus 2 by RT PCR: NEGATIVE

## 2021-07-22 LAB — PROCALCITONIN: Procalcitonin: 0.12 ng/mL

## 2021-07-22 LAB — MRSA NEXT GEN BY PCR, NASAL: MRSA by PCR Next Gen: NOT DETECTED

## 2021-07-22 LAB — PREPARE RBC (CROSSMATCH)

## 2021-07-22 MED ORDER — SODIUM CHLORIDE 0.9% IV SOLUTION: Freq: Once | INTRAVENOUS | Status: DC

## 2021-07-22 MED ORDER — MIDODRINE HCL 5 MG PO TABS
2.5000 mg | ORAL_TABLET | Freq: Three times a day (TID) | ORAL | Status: DC
  Administered 2021-07-22 – 2021-07-26 (×11): 2.5 mg via ORAL
  Filled 2021-07-22 (×12): qty 1

## 2021-07-22 MED ORDER — OXYCODONE HCL 5 MG PO TABS: 5.0000 mg | ORAL_TABLET | ORAL | Status: DC | PRN

## 2021-07-22 MED ORDER — ALBUTEROL SULFATE HFA 108 (90 BASE) MCG/ACT IN AERS: 2.0000 | INHALATION_SPRAY | RESPIRATORY_TRACT | Status: DC | PRN

## 2021-07-22 MED ORDER — POTASSIUM CHLORIDE 10 MEQ/100ML IV SOLN
10.0000 meq | INTRAVENOUS | Status: AC
  Administered 2021-07-22 (×2): 10 meq via INTRAVENOUS
  Filled 2021-07-22 (×2): qty 100

## 2021-07-22 MED ORDER — PANTOPRAZOLE SODIUM 40 MG IV SOLR
40.0000 mg | Freq: Two times a day (BID) | INTRAVENOUS | Status: DC
Start: 2021-07-26 — End: 2021-07-26
  Administered 2021-07-26: 40 mg via INTRAVENOUS
  Filled 2021-07-22: qty 10

## 2021-07-22 MED ORDER — LACTATED RINGERS IV SOLN
INTRAVENOUS | Status: AC
  Administered 2021-07-22: 75 mL/h via INTRAVENOUS

## 2021-07-22 MED ORDER — ORAL CARE MOUTH RINSE
15.0000 mL | Freq: Two times a day (BID) | OROMUCOSAL | Status: DC
  Administered 2021-07-24 – 2021-07-26 (×4): 15 mL via OROMUCOSAL

## 2021-07-22 MED ORDER — FUROSEMIDE 10 MG/ML IJ SOLN: 20.0000 mg | Freq: Four times a day (QID) | INTRAMUSCULAR | Status: AC

## 2021-07-22 MED ORDER — CHLORHEXIDINE GLUCONATE CLOTH 2 % EX PADS
6.0000 | MEDICATED_PAD | Freq: Every day | CUTANEOUS | Status: DC
  Administered 2021-07-22 – 2021-07-26 (×5): 6 via TOPICAL

## 2021-07-22 MED ORDER — PANTOPRAZOLE INFUSION (NEW) - SIMPLE MED
8.0000 mg/h | INTRAVENOUS | Status: AC
Start: 2021-07-22 — End: 2021-07-25
  Administered 2021-07-22 – 2021-07-25 (×6): 8 mg/h via INTRAVENOUS
  Filled 2021-07-22: qty 80
  Filled 2021-07-22 (×4): qty 100
  Filled 2021-07-22 (×2): qty 80
  Filled 2021-07-22: qty 100

## 2021-07-22 MED ORDER — ONDANSETRON HCL 4 MG PO TABS: 4.0000 mg | ORAL_TABLET | Freq: Four times a day (QID) | ORAL | Status: DC | PRN

## 2021-07-22 MED ORDER — ONDANSETRON HCL 4 MG/2ML IJ SOLN: 4.0000 mg | Freq: Four times a day (QID) | INTRAMUSCULAR | Status: DC | PRN

## 2021-07-22 MED ORDER — ACETAMINOPHEN 650 MG RE SUPP: 650.0000 mg | Freq: Four times a day (QID) | RECTAL | Status: DC | PRN

## 2021-07-22 MED ORDER — ACETAMINOPHEN 325 MG PO TABS: 650.0000 mg | ORAL_TABLET | Freq: Four times a day (QID) | ORAL | Status: DC | PRN

## 2021-07-22 NOTE — Progress Notes (Signed)
Port on left side of chest accessed prior to arriving to ICU unit. Dressing clean, dry and intact with labeled date of 07/21/21 on it. ?

## 2021-07-22 NOTE — ED Notes (Signed)
Lab draws delayed at this time d/t pt receiving blood products ?

## 2021-07-22 NOTE — Hospital Course (Addendum)
?  [name] is a 88 y.o. male with medical history significant of with history of bone cancer currently getting chemo, CKD, COPD, GERD presents to ED with a chief complaint of dizziness, weakness, dyspnea.   ? ? ?Patient reports that he was post to go to the cancer center 07/21/21, but he was feeling dizzy and seeing double.  He sat down to see if rest would make it go away but it did not.  He progressively became weaker throughout the day.  He can even ambulate to make himself something to eat.  He reports exertional dyspnea as well.  He tried his inhalers and they did not help.  He denies any chest pain or abdominal pain.  He denies any hematochezia.  He does report that the first vomiting he had when he got home from his last hospitalization was black and bloody.  Since then he has had at least 2 melanotic stools per day.  He reports that he thought that that was just irritation from the previous scopes on last admission.  Patient was just discharged from the hospital on March 1.  At that time he presented with symptomatic anemia as well.  He has had numerous transfusions with packed red blood cells every 2-4 weeks.  He received a total of 5 units in the last admission.  s/p EGD 2/28 with clotted blood and GE junction biopsied, injected, clip placed, sloughing of mucosa in the esophagus, gastritis.  Surgical pathology showed presence of necroinflammatory debris but no other abnormalities. ?  ?  ?Today's presentation is very similar to the last presentation. ?Patient reports he does not smoke, does not drink alcohol, does not use illicit drugs.   ?He is vaccinated for COVID.   ?Patient is partial code.  He does not want CPR. ? ?In the ED ?Temp 97.7, heart rate 91-97, respiratory rate 14-18, blood pressure 84/59-108/48 ?Initially satting 87%, improved to 94% on 4 L nasal cannula ?CBC shows a hemoglobin of 5.4 ?potassium of 3.4 BUN of 66 and a creatinine of 1.9 ?Albumin is 2.5 ?Initial lactic acid is 2.3,  repeat 1.4 ?BC smear showed polychromasia, schistocytes, teardrop cells, left shift ?FOBT positive ?Blood cultures are pending as there was initially a code sepsis with the hypotension ?Chest x-ray stable without acute cardiopulmonary disease  ?EKG shows a heart rate of 87, sinus rhythm, QTc 446 ?

## 2021-07-22 NOTE — Consult Note (Addendum)
$'@LOGO'r$ @   Referring Provider: [facility] hospitalist. Primary Care Physician:  Sharilyn Sites, MD Primary Gastroenterologist:  Dr. Gala Romney  Date of Admission: 07/21/21 Date of Consultation: 07/22/21  Reason for Consultation: Acute on chronic symptomatic anemia, melena.  HPI:  [name] is a 88 y.o. year old male with history of MDS on azacitidine requiring intermittent blood transfusions per oncology, COPD on 3 liters O2 via nasal cannula as outpatient, CKD, recently admitted in the latter part of February and discharged on 3/1 with profound symptomatic anemia, hemoglobin of 5.3, heme positive without overt GI bleeding, s/p EGD 2/28 with clotted blood and GE junction biopsied, injected, clip placed, sloughing of mucosa in the esophagus, gastritis.  He received 5 units PRBCs and was discharged with hemoglobin of 9.5.  Surgical pathology revealed necroinflammatory debris, detached reactive squamous cells, granular cells and bacterial clumps without overt malignancy.  He returned to the emergency room on 3/6 with chief complaint of dizziness, weakness, dyspnea, also reported to melanotic stools daily since hospital discharge.  ED course: BP 84/59, SPO2 87% improved on 4 L nasal cannula, pulse 97, temp 97.7. Hemoglobin 5.4, WBC 11.0, platelets 45. INR 1.1. Potassium 3.4, sodium 137, creatinine 1.89, BUN 66, LFTs within normal limits. Lactic acid 2.3. Respiratory panel negative. Blood cultures drawn. Stool heme positive. Chest x-ray stable without acute cardiopulmonary disease  EKG shows a heart rate of 87, sinus rhythm, QTc 446 He was made n.p.o., given albuterol, started on Protonix infusion, IV fluids, 2 units PRBCs ordered.  Consult:  Reports he left the hospital feeling well.  On Sunday, he noticed he was feeling very weak, lightheaded, and could not make it to church.  The symptoms progressed on Monday and came to the emergency room for further evaluation.  Reports he has been having 1-2  melanotic stools since being discharged from the hospital.  Last bowel movement was yesterday.  Denies abdominal pain, nausea, or vomiting until he was in the EMS yesterday when he had an episode of vomiting.  He is not sure if he had hematemesis or coffee-ground emesis.  Denies hematochezia or diarrhea.  Reports he was taking PPI twice daily as prescribed.  Denies NSAIDs.  Reports he had been eating well prior to his last admission and was eating well after his last admission until the last couple of days when he was so weak that he felt like he could not even chew his food.  Reports weight loss of about 30 pounds in the last year.   Denies chest pain or heart palpitations.  Chronic shortness of breath with history of COPD on 3-4L nasal cannula at home.  Overall, reports he is feeling much better since receiving 2 units PRBCs and IV fluids.  Lightheadedness has resolved.  States he feels like he could get up and dance.  Past Medical History:  Diagnosis Date   Bone cancer ([place])    Chronic kidney insufficiency    COPD (chronic obstructive pulmonary disease) ([facility])    GERD (gastroesophageal reflux disease)    Pulmonary nodule     Past Surgical History:  Procedure Laterality Date   BACK SURGERY     BIOPSY  07/15/2021   Procedure: BIOPSY;  Surgeon: Harvel Quale, MD;  [facility];  Service: Gastroenterology;;   CATARACT EXTRACTION W/PHACO Right 03/09/2016   Procedure: CATARACT EXTRACTION PHACO AND INTRAOCULAR LENS PLACEMENT RIGHT EYE CDE=8.58;  Surgeon: Tonny Branch, MD;  Location: [facility];  Service: Ophthalmology;  Laterality: Right;  right  CATARACT EXTRACTION W/PHACO Left 04/13/2016   Procedure: CATARACT EXTRACTION PHACO AND INTRAOCULAR LENS PLACEMENT (IOC);  Surgeon: Tonny Branch, MD;  Location: [facility];  Service: Ophthalmology;  Laterality: Left;  CDE: 8.08   ESOPHAGOGASTRODUODENOSCOPY (EGD) WITH PROPOFOL N/A 07/14/2021   Procedure: ESOPHAGOGASTRODUODENOSCOPY (EGD)  WITH PROPOFOL;  Surgeon: Eloise Harman, DO;  [facility];  Service: Endoscopy;  Laterality: N/A;   ESOPHAGOGASTRODUODENOSCOPY (EGD) WITH PROPOFOL N/A 07/15/2021   Procedure: ESOPHAGOGASTRODUODENOSCOPY (EGD) WITH PROPOFOL;  Surgeon: Harvel Quale, MD;  [facility];  Service: Gastroenterology;  Laterality: N/A;   HEMOSTASIS CLIP PLACEMENT  07/15/2021   Procedure: HEMOSTASIS CLIP PLACEMENT;  Surgeon: Harvel Quale, MD;  [facility];  Service: Gastroenterology;;   KYPHOPLASTY Bilateral 06/09/2019   Procedure: T11 KYPHOPLASTY;  Surgeon: Consuella Lose, MD;  Location: [facility];  Service: Neurosurgery;  Laterality: Bilateral;   None to Date  10/01/15   PORTACATH PLACEMENT Left 10/11/2020   Procedure: INSERTION PORT-A-CATH (attached catheter in left internal jugular);  Surgeon: Virl Cagey, MD;  Location: [facility];  Service: General;  Laterality: Left;    Prior to Admission medications   Medication Sig Start Date End Date Taking? Authorizing Provider  albuterol (VENTOLIN HFA) 108 (90 Base) MCG/ACT inhaler Inhale 2 puffs into the lungs every 4 (four) hours as needed for wheezing or shortness of breath. 04/04/20   Roxan Hockey, MD  midodrine (PROAMATINE) 2.5 MG tablet Take 1 tablet (2.5 mg total) by mouth 3 (three) times daily with meals. 06/23/21   Derek Jack, MD  pantoprazole (PROTONIX) 40 MG tablet Take 1 tablet (40 mg total) by mouth 2 (two) times daily. 07/16/21 07/16/22  ShahmehdiValeria Batman, MD  silodosin (RAPAFLO) 4 MG CAPS capsule Take 4 mg by mouth at bedtime. 06/27/21   [provider]    Current Facility-Administered Medications  Medication Dose Route Frequency Provider Last Rate Last Admin   0.9 %  sodium chloride infusion  10 mL/hr Intravenous Once Zierle-Ghosh, Asia B, DO       acetaminophen (TYLENOL) tablet 650 mg  650 mg Oral Q6H PRN Zierle-Ghosh, Asia B, DO       Or   acetaminophen (TYLENOL) suppository  650 mg  650 mg Rectal Q6H PRN Zierle-Ghosh, Asia B, DO       albuterol (VENTOLIN HFA) 108 (90 Base) MCG/ACT inhaler 2 puff  2 puff Inhalation Q4H PRN Zierle-Ghosh, Asia B, DO       lactated ringers infusion   Intravenous Continuous Zierle-Ghosh, Asia B, DO       midodrine (PROAMATINE) tablet 2.5 mg  2.5 mg Oral TID WC Zierle-Ghosh, Asia B, DO       ondansetron (ZOFRAN) tablet 4 mg  4 mg Oral Q6H PRN Zierle-Ghosh, Asia B, DO       Or   ondansetron (ZOFRAN) injection 4 mg  4 mg Intravenous Q6H PRN Zierle-Ghosh, Asia B, DO       oxyCODONE (Oxy IR/ROXICODONE) immediate release tablet 5 mg  5 mg Oral Q4H PRN Zierle-Ghosh, Asia B, DO       [START ON 07/25/2021] pantoprazole (PROTONIX) injection 40 mg  40 mg Intravenous Q12H Zierle-Ghosh, Asia B, DO       pantoprozole (PROTONIX) 80 mg /NS 100 mL infusion  8 mg/hr Intravenous Continuous Zierle-Ghosh, Asia B, DO 10 mL/hr at 07/22/21 0119 8 mg/hr at 07/22/21 0119   Current Outpatient Medications  Medication Sig Dispense Refill   albuterol (VENTOLIN HFA) 108 (90 Base) MCG/ACT  inhaler Inhale 2 puffs into the lungs every 4 (four) hours as needed for wheezing or shortness of breath. 18 g 2   midodrine (PROAMATINE) 2.5 MG tablet Take 1 tablet (2.5 mg total) by mouth 3 (three) times daily with meals. 270 each 3   pantoprazole (PROTONIX) 40 MG tablet Take 1 tablet (40 mg total) by mouth 2 (two) times daily. 30 tablet 1   silodosin (RAPAFLO) 4 MG CAPS capsule Take 4 mg by mouth at bedtime.     Facility-Administered Medications Ordered in Other Encounters  Medication Dose Route Frequency Provider Last Rate Last Admin   0.9 %  sodium chloride infusion   Intravenous Continuous Derek Jack, MD   Stopped at 01/06/21 1018   octreotide (SANDOSTATIN LAR) 30 MG IM injection            sodium chloride flush (NS) 0.9 % injection 10 mL  10 mL Intracatheter PRN Derek Jack, MD   10 mL at 01/06/21 1215   sodium chloride flush (NS) 0.9 % injection 10 mL  10  mL Intravenous PRN Derek Jack, MD   10 mL at 02/17/21 0902   sodium chloride flush (NS) 0.9 % injection 10 mL  10 mL Intravenous PRN Derek Jack, MD   10 mL at 03/10/21 0936    Allergies as of 07/21/2021   (No Known Allergies)    Family History  Problem Relation Age of Onset   Kidney disease Mother    Kidney disease Sister    Colon cancer Neg Hx    Gastric cancer Neg Hx    Esophageal cancer Neg Hx     Social History   Socioeconomic History   Marital status: Widowed    Spouse name: Not on file   Number of children: Not on file   Years of education: Not on file   Highest education level: Not on file  Occupational History   Not on file  Tobacco Use   Smoking status: Former    Years: 15.00    Types: Cigarettes, Cigars    Quit date: 10/01/1990    Years since quitting: 30.8   Smokeless tobacco: Never   Tobacco comments:    Quit x 25-30 years; 2 cigars daily when smoked  Vaping Use   Vaping Use: Never used  Substance and Sexual Activity   Alcohol use: Not Currently    Alcohol/week: 0.0 standard drinks   Drug use: No   Sexual activity: Never    Birth control/protection: None  Other Topics Concern   Not on file  Social History Narrative   Not on file   Social Determinants of Health   Financial Resource Strain: Not on file  Food Insecurity: Not on file  Transportation Needs: Not on file  Physical Activity: Not on file  Stress: Not on file  Social Connections: Not on file  Intimate Partner Violence: Not on file    Review of Systems: Gen: Denies fever, chills, cold or flulike symptoms. CV: Denies chest pain, heart palpitations. Resp: Admits to chronic shortness of breath on 3-4 L O2 via nasal cannula at home. GI: See HPI GU : Denies urinary burning, urinary frequency, urinary incontinence.  MS: Denies joint pain. Derm: Denies rash. Psych: Denies depression, anxiety.  Heme: See HPI.  Physical Exam: Vital signs in last 24 hours: Temp:   [97.3 F (36.3 C)-98.3 F (36.8 C)] 97.4 F (36.3 C) (03/07 0619) Pulse Rate:  [76-97] 82 (03/07 0700) Resp:  [13-22] 14 (03/07 0730) BP: (84-108)/(47-59) 95/54 (  03/07 0730) SpO2:  [87 %-100 %] 99 % (03/07 0700) Weight:  [57 kg] 57 kg (03/06 2229)   General:   Alert,  thin, cachectic, pleasant and cooperative in NAD Head:  Normocephalic and atraumatic. Eyes:  Sclera clear, no icterus.   Conjunctiva pink. Ears:  Normal auditory acuity. Lungs:  Clear throughout to auscultation.   No wheezes, crackles, or rhonchi. No acute distress. Heart:  Regular rate and rhythm; no murmurs, clicks, rubs,  or gallops. Abdomen:  Soft, nontender and nondistended. No masses, hepatosplenomegaly or hernias noted. Normal bowel sounds, without guarding, and without rebound.   Rectal:  Deferred  Msk:  Symmetrical without gross deformities. Normal posture. Extremities:  Without edema. Neurologic:  Alert and  oriented x4;  grossly normal neurologically. Skin:  Intact without significant lesions or rashes. Psych: Normal mood and affect.  Intake/Output from previous day: 03/06 0701 - 03/07 0700 In: 3108.9 [Blood:1260; IV Piggyback:1848.9] Out: -  Intake/Output this shift: No intake/output data recorded.  Lab Results: Recent Labs    07/21/21 2255  WBC 11.0*  HGB 5.4*  HCT 17.4*  PLT 45*   BMET Recent Labs    07/21/21 2255  NA 137  K 3.4*  CL 108  CO2 22  GLUCOSE 113*  BUN 66*  CREATININE 1.89*  CALCIUM 8.7*   LFT Recent Labs    07/21/21 2255  PROT 5.8*  ALBUMIN 2.5*  AST 18  ALT 15  ALKPHOS 50  BILITOT 0.5   PT/INR Recent Labs    07/21/21 2255  LABPROT 14.4  INR 1.1   Studies/Results: DG Chest Port 1 View  Result Date: 07/21/2021 CLINICAL DATA:  Shortness of breath. EXAM: PORTABLE CHEST 1 VIEW COMPARISON:  July 12, 2021 FINDINGS: There is stable left-sided venous Port-A-Cath positioning. The lungs are hyperinflated. There is marked severity emphysematous lung disease  with stable chronic, diffusely increased interstitial lung markings. Mild, stable atelectasis is seen within the bilateral lung bases. There is no evidence of a pleural effusion or pneumothorax. The heart size and mediastinal contours are within normal limits. Prior vertebroplasty is seen within the mid to lower thoracic spine and upper lumbar spine. IMPRESSION: Stable exam, without acute cardiopulmonary disease. Electronically Signed   By: Virgina Norfolk M.D.   On: 07/21/2021 23:21    Impression: 88 y.o. year old male with history of MDS on azacitidine requiring intermittent blood transfusions per oncology, COPD on 3 liters O2 via nasal cannula as outpatient, CKD, recently admitted in the latter part of February and discharged on 3/1 with profound symptomatic anemia, hemoglobin of 5.3, heme positive without overt GI bleeding, s/p EGD 2/28 with clotted blood and GE junction biopsied, injected, clip placed, sloughing of mucosa in the esophagus, gastritis.  He received 5 units PRBCs and was discharged with hemoglobin of 9.5.  Surgical pathology revealed necroinflammatory debris, detached reactive squamous cells, granular cells and bacterial clumps without overt malignancy. Now returned to the ED on 3/6 with chief complaint of dizziness, weakness, dyspnea, now admitted with symptomatic anemia with hemoglobin of 5.4, hypotension, lactic acidosis initially, now resolved. GI consulted for further evaluation.   Acute on chronic symptomatic anemia with melena: Recent EGD for symptomatic anemia/upper GI bleed with clotted blood and GE junction biopsy, injected, clip placed, sloughing of mucosa in the esophagus, gastritis.  He was started on PPI twice daily, and discharged on 3/1 with a hemoglobin of 9.5.  Now return to the emergency room with hemoglobin of 5.4, associated weakness, lightheadedness, hypotension with BP  in the 80s/50s, and reported 1-2 melanotic stools since hospital discharge. Denies NISAD's and  reports compliance with PPI BID. Concerned for recurrent upper GI bleed vs small bowel bleeding.  Clinically, he is feeling improved after receiving 2 units PRBCs and IV fluids.  BP still soft, but improved to upper 90's/50's while I was in the room. Last melanotic stool was yesterday.  Denies hematemesis or coffee-ground emesis though he did have an episode of vomiting in the EMS yesterday and did not see what it looked like.   I will obtain posttransfusion CBC and discuss with Dr. Jenetta Downer about repeating an EGD.   Plan: Update CBC now.  Keep n.p.o. for now. Will discuss repeat EGD with Dr. Jenetta Downer. Continue PPI infusion. Continue supportive measures. Transfuse for Hgb less than 7.    LOS: 0 days    07/22/2021, 7:42 AM   Aliene Altes, PA-C [facility]

## 2021-07-22 NOTE — Assessment & Plan Note (Addendum)
Baseline 3 L oxygen requirement ?Continue albuterol as needed ?Stable ? ? ?

## 2021-07-22 NOTE — ED Notes (Signed)
MD messaged re: Midodrine 2.5 mg med d/t med to be given with food and pt is NPO ?

## 2021-07-22 NOTE — Assessment & Plan Note (Signed)
Creatinine at baseline 1.3-1.4 ?Creatinine today 1.89 ?Secondary to anemia with a hemoglobin of 5.4 ?Transfuse as above, trend in the a.m. ?

## 2021-07-22 NOTE — ED Notes (Signed)
Pt provides verbal permission to discuss plan of care with the pts daugher, Janene Madeira, who is requesting to the doctor call her at [phone] ?

## 2021-07-22 NOTE — Assessment & Plan Note (Addendum)
Replaced ?Check mag--2.1 ?

## 2021-07-22 NOTE — Assessment & Plan Note (Addendum)
Body mass index is 17.04 kg/m?. ?- Albumin 2.5 ?-Dietary consult ?Encourage nutrient dense food choices when patient is able to take p.o. ?Considering  adding Ensure shakes when patient is able to take p.o. ?

## 2021-07-22 NOTE — Progress Notes (Signed)
Spoke with patient's daughter Lahoma Rocker & made aware of patient's admission & current condition, transferred call to patient's room as requested & patient also spoke with Stanton Kidney ?

## 2021-07-22 NOTE — Discharge Planning (Signed)
Oncology Discharge Planning Admission Note ? ?[place] [facility] ?Address: [address] [place], [place] [address] ?Hours of Operation:  8am - 5pm, Monday - Friday  ?Clinic Contact Information:  [phone] ? ?Oncology Care Team: ?Medical Oncologist:  Dr. Derek Jack ? ?Dr. Delton Coombes has been made aware of this hospital admission dated 07/22/21, and the cancer center will follow [facility] inpatient care to assist with discharge planning as indicated by the oncologist.  We will arrange for further outpatient follow up closer to discharge.  Has scheduled appointments, however will see him earlier if needed. ? ?Disclaimer:  This [place] note does not imply a formal consult request has been made by the admitting attending for this admission or there will be an inpatient consult completed by oncology.  Please request oncology consults as per standard process as indicated. ?

## 2021-07-22 NOTE — Progress Notes (Signed)
Report received from Abigail Butts, [place] on patient coming to ICU 05 ?

## 2021-07-22 NOTE — ED Notes (Signed)
Polina, MD at bedside assessing L wrist d/t infiltration, Potassium, lactated ringers, & protonix were infusing in the line upon assessment, infusions stopped, IV removed, MD aware, no redness or drainage noted, area edematous and tender to touch ?

## 2021-07-22 NOTE — Progress Notes (Signed)
PROGRESS NOTE    Patient: [name]                            PCP: Sharilyn Sites, MD                    DOB: 11/02/1929            DOA: 07/21/2021 [id number]             DOS: 07/22/2021, 12:05 PM   LOS: 0 days   Date of Service: The patient was seen and examined on 07/22/2021  Subjective:   The patient was seen and examined this morning. Stable at this time. Tolerated blood transfusion, Denying of having any active rectal bleed  Brief Narrative:    [name] is a 88 y.o. male with medical history significant of with history of bone cancer currently getting chemo, CKD, COPD, GERD presents to ED with a chief complaint of dizziness, weakness, dyspnea.     Patient reports that he was post to go to the cancer center yesterday, but he was feeling dizzy and seeing double.  He sat down to see if rest would make it go away but it did not.  He progressively became weaker throughout the day.  He can even ambulate to make himself something to eat.  He reports exertional dyspnea as well.  He tried his inhalers and they did not help.  He denies any chest pain or abdominal pain.  He denies any hematochezia.  He does report that the first vomiting he had when he got home from his last hospitalization was black and bloody.  Since then he has had at least 2 melanotic stools per day.  He reports that he thought that that was just irritation from the previous scopes on last admission.  Patient was just discharged from the hospital on March 1.  At that time he presented with symptomatic anemia as well.  He has had numerous transfusions with packed red blood cells every 2-4 weeks.  He received a total of 5 units in the last admission with the most recent being 2 units on February 28.   GI was consulted and EGD was done.  There was persistent oozing blood and 1 hemostatic clip was successfully placed.  The area was injected with epinephrine for hemostasis as well.   Hemoglobin at discharge was  9.5.Marland Kitchen... today its 5.4   Today's presentation is very similar to the last presentation. Patient reports he does not smoke, does not drink alcohol, does not use illicit drugs.   He is vaccinated for COVID.   Patient is partial code.  He does not want CPR.  In the ED Temp 97.7, heart rate 91-97, respiratory rate 14-18, blood pressure 84/59-108/48 Initially satting 87%, improved to 94% on 4 L nasal cannula CBC shows a hemoglobin of 5.4 potassium of 3.4 BUN of 66 and a creatinine of 1.9 Albumin is 2.5 Initial lactic acid is 2.3, repeat 1.4 BC smear showed polychromasia, schistocytes, teardrop cells, [name] shift FOBT positive Blood cultures are pending as there was initially a code sepsis with the hypotension Chest x-ray stable without acute cardiopulmonary disease  EKG shows a heart rate of 87, sinus rhythm, QTc 446 Patient was made n.p.o., given albuterol, Protonix 40, 1.75 L bolus ordered, and LR 150 mL/h continued -slowing down fluids as to not over dilute patient with already low hemoglobin.    Assessment &  Plan:   Principal Problem:   Acute on chronic respiratory failure with hypoxia (HCC) Active Problems:   COPD (chronic obstructive pulmonary disease) (HCC)   Symptomatic anemia   Protein-calorie malnutrition, moderate (HCC)   AKI (acute kidney injury) (HCC)   Hypokalemia   Hypotensive episode     Assessment and Plan: * Acute on chronic respiratory failure with hypoxia (HCC) Patient wears 3 L nasal cannula baseline Presents to the ED with hypoxic with a O2 sat of 87% Normalized on 4 L nasal cannula Secondary to anemia Transfuse as above Continue to monitor  Hypotensive episode - Likely due to severe anemia, hypovolemia -Continue with 2U PRBC blood transfusion, IV fluids -We will initiate midodrine if needed -Currently BP improving  Hypokalemia Replace and rechecking today   AKI (acute kidney injury) ([place]) Creatinine at baseline 1.3-1.4 Creatinine today  1.89 Secondary to anemia with a hemoglobin of 5.4 Transfuse as above, trend in the a.m.  Protein-calorie malnutrition, moderate (HCC) Body mass index is 17.04 kg/m. - Albumin 2.5 -Dietary consult Encourage nutrient dense food choices when patient is able to take p.o. Considering  adding Ensure shakes when patient is able to take p.o.  Symptomatic anemia - Hypotensive, generalized weakness due to anemia Patient reports melena since his last discharge Hemoglobin 5.4 >> 2 U PRBC >>>   Patient complains of dyspnea and has a higher oxygen requirement than baseline, and was also hypotensive at arrival Trend CBC -GI consulted - Per GI:   Keep n.p.o. for now. ? EGD today  Continue PPI infusion.   COPD (chronic obstructive pulmonary disease) (HCC) Baseline 3 L oxygen requirement Continue albuterol as needed Stable     -----------------------------------------------------------------------------------------------------  DVT prophylaxis:  SCDs Start: 07/22/21 0143   Code Status:   Code Status: Partial Code  Family Communication: No family member present at bedside- attempt will be made to update daily The above findings and plan of care has been discussed with patient (and family)  in detail,  they expressed understanding and agreement of above. -Advance care planning has been discussed.   Admission status:   Status is: Inpatient Remains inpatient appropriate because: Treating severe symptomatic anemia, GI bleed, needing PRBC transfusion, GI intervention   Disposition : From home planning to return home in 1-2 days  Procedures:   No admission procedures for hospital encounter.   Antimicrobials:  Anti-infectives (From admission, onward)    None        Medication:   midodrine  2.5 mg Oral TID WC   [START ON 07/25/2021] pantoprazole  40 mg Intravenous Q12H    acetaminophen **OR** acetaminophen, albuterol, ondansetron **OR** ondansetron (ZOFRAN) IV,  oxyCODONE   Objective:   Vitals:   07/22/21 0900 07/22/21 0930 07/22/21 1000 07/22/21 1030  BP: (!) 96/57 (!) 94/59 (!) 102/55 105/60  Pulse: 78 75 70 83  Resp: (!) '21 16 12 20  '$ Temp:      TempSrc:      SpO2: 99% 100% 100% 100%  Weight:      Height:        Intake/Output Summary (Last 24 hours) at 07/22/2021 1205 Last data filed at 07/22/2021 0834 Gross per 24 hour  Intake 3423.89 ml  Output 500 ml  Net 2923.89 ml   Filed Weights   07/21/21 2229  Weight: 57 kg     Examination:   Physical Exam  Constitution:  Alert, cooperative, no distress,  Appears calm and comfortable  Psychiatric:   Normal and stable mood and affect, cognition  intact,   HEENT:        Normocephalic, PERRL, otherwise with in Normal limits  Chest:         Chest symmetric Cardio vascular:  S1/S2, RRR, No murmure, No Rubs or Gallops  pulmonary: Clear to auscultation bilaterally, respirations unlabored, negative wheezes / crackles Abdomen: Soft, non-tender, non-distended, bowel sounds,no masses, no organomegaly Muscular skeletal: Limited exam - in bed, able to move all 4 extremities,   Neuro: CNII-XII intact. , normal motor and sensation, reflexes intact  Extremities: No pitting edema lower extremities, +2 pulses  Skin: Dry, warm to touch, negative for any Rashes, No open wounds Wounds: per nursing documentation   ------------------------------------------------------------------------------------------------------------------------------------------    LABs:  CBC Latest Ref Rng & Units 07/21/2021 07/16/2021 07/15/2021  WBC 4.0 - 10.5 K/uL 11.0(H) 7.2 -  Hemoglobin 13.0 - 17.0 g/dL 5.4(LL) 9.5(L) 9.9(L)  Hematocrit 39.0 - 52.0 % 17.4(L) 29.0(L) 29.7(L)  Platelets 150 - 400 K/uL 45(L) 39(L) -   CMP Latest Ref Rng & Units 07/21/2021 07/16/2021 07/15/2021  Glucose 70 - 99 mg/dL 113(H) 84 79  BUN 8 - 23 mg/dL 66(H) 37(H) 48(H)  Creatinine 0.61 - 1.24 mg/dL 1.89(H) 1.38(H) 1.48(H)  Sodium 135 - 145 mmol/L 137  134(L) 136  Potassium 3.5 - 5.1 mmol/L 3.4(L) 3.5 3.5  Chloride 98 - 111 mmol/L 108 107 107  CO2 22 - 32 mmol/L 22 21(L) 22  Calcium 8.9 - 10.3 mg/dL 8.7(L) 8.6(L) 8.5(L)  Total Protein 6.5 - 8.1 g/dL 5.8(L) - -  Total Bilirubin 0.3 - 1.2 mg/dL 0.5 - -  Alkaline Phos 38 - 126 U/L 50 - -  AST 15 - 41 U/L 18 - -  ALT 0 - 44 U/L 15 - -       Micro Results Recent Results (from the past 240 hour(s))  Resp Panel by RT-PCR (Flu A&B, Covid) Nasopharyngeal Swab     Status: None   Collection Time: 07/12/21  4:05 PM   Specimen: Nasopharyngeal Swab; Nasopharyngeal(NP) swabs in vial transport medium  Result Value Ref Range Status   SARS Coronavirus 2 by RT PCR NEGATIVE NEGATIVE Final    Comment: (NOTE) SARS-CoV-2 target nucleic acids are NOT DETECTED.  The SARS-CoV-2 RNA is generally detectable in upper respiratory specimens during the acute phase of infection. The lowest concentration of SARS-CoV-2 viral copies this assay can detect is 138 copies/mL. A negative result does not preclude SARS-Cov-2 infection and should not be used as the sole basis for treatment or other patient management decisions. A negative result may occur with  improper specimen collection/handling, submission of specimen other than nasopharyngeal swab, presence of viral mutation(s) within the areas targeted by this assay, and inadequate number of viral copies(<138 copies/mL). A negative result must be combined with clinical observations, patient history, and epidemiological information. The expected result is Negative.  Fact Sheet for Patients:  EntrepreneurPulse.com.au  Fact Sheet for Healthcare Providers:  IncredibleEmployment.be  This test is no t yet approved or cleared by the [place] FDA and  has been authorized for detection and/or diagnosis of SARS-CoV-2 by FDA under an Emergency Use Authorization (EUA). This EUA will remain  in effect (meaning this test can be  used) for the duration of the COVID-19 declaration under Section 564(b)(1) of the Act, 21 U.S.C.section 360bbb-3(b)(1), unless the authorization is terminated  or revoked sooner.       Influenza A by PCR NEGATIVE NEGATIVE Final   Influenza B by PCR NEGATIVE NEGATIVE Final    Comment: (  NOTE) The Xpert Xpress SARS-CoV-2/FLU/RSV plus assay is intended as an aid in the diagnosis of influenza from Nasopharyngeal swab specimens and should not be used as a sole basis for treatment. Nasal washings and aspirates are unacceptable for Xpert Xpress SARS-CoV-2/FLU/RSV testing.  Fact Sheet for Patients: EntrepreneurPulse.com.au  Fact Sheet for Healthcare Providers: IncredibleEmployment.be  This test is not yet approved or cleared by the [place] FDA and has been authorized for detection and/or diagnosis of SARS-CoV-2 by FDA under an Emergency Use Authorization (EUA). This EUA will remain in effect (meaning this test can be used) for the duration of the COVID-19 declaration under Section 564(b)(1) of the Act, 21 U.S.C. section 360bbb-3(b)(1), unless the authorization is terminated or revoked.  Performed at [facility], [address] [place], [place] [address]   MRSA Next Gen by PCR, Nasal     Status: None   Collection Time: 07/12/21  8:00 PM   Specimen: Nasal Mucosa; Nasal Swab  Result Value Ref Range Status   MRSA by PCR Next Gen NOT DETECTED NOT DETECTED Final    Comment: (NOTE) The GeneXpert MRSA Assay (FDA approved for NASAL specimens only), is one component of a comprehensive MRSA colonization surveillance program. It is not intended to diagnose MRSA infection nor to guide or monitor treatment for MRSA infections. Test performance is not FDA approved in patients less than 30 years old. Performed at [name], [address] [place], [place] [address]   Resp Panel by RT-PCR (Flu A&B, Covid) Nasopharyngeal Swab     Status: None    Collection Time: 07/21/21 10:55 PM   Specimen: Nasopharyngeal Swab; Nasopharyngeal(NP) swabs in vial transport medium  Result Value Ref Range Status   SARS Coronavirus 2 by RT PCR NEGATIVE NEGATIVE Final    Comment: (NOTE) SARS-CoV-2 target nucleic acids are NOT DETECTED.  The SARS-CoV-2 RNA is generally detectable in upper respiratory specimens during the acute phase of infection. The lowest concentration of SARS-CoV-2 viral copies this assay can detect is 138 copies/mL. A negative result does not preclude SARS-Cov-2 infection and should not be used as the sole basis for treatment or other patient management decisions. A negative result may occur with  improper specimen collection/handling, submission of specimen other than nasopharyngeal swab, presence of viral mutation(s) within the areas targeted by this assay, and inadequate number of viral copies(<138 copies/mL). A negative result must be combined with clinical observations, patient history, and epidemiological information. The expected result is Negative.  Fact Sheet for Patients:  EntrepreneurPulse.com.au  Fact Sheet for Healthcare Providers:  IncredibleEmployment.be  This test is no t yet approved or cleared by the [place] FDA and  has been authorized for detection and/or diagnosis of SARS-CoV-2 by FDA under an Emergency Use Authorization (EUA). This EUA will remain  in effect (meaning this test can be used) for the duration of the COVID-19 declaration under Section 564(b)(1) of the Act, 21 U.S.C.section 360bbb-3(b)(1), unless the authorization is terminated  or revoked sooner.       Influenza A by PCR NEGATIVE NEGATIVE Final   Influenza B by PCR NEGATIVE NEGATIVE Final    Comment: (NOTE) The Xpert Xpress SARS-CoV-2/FLU/RSV plus assay is intended as an aid in the diagnosis of influenza from Nasopharyngeal swab specimens and should not be used as a sole basis for treatment.  Nasal washings and aspirates are unacceptable for Xpert Xpress SARS-CoV-2/FLU/RSV testing.  Fact Sheet for Patients: EntrepreneurPulse.com.au  Fact Sheet for Healthcare Providers: IncredibleEmployment.be  This test is not yet approved  or cleared by the [place] and has been authorized for detection and/or diagnosis of SARS-CoV-2 by FDA under an Emergency Use Authorization (EUA). This EUA will remain in effect (meaning this test can be used) for the duration of the COVID-19 declaration under Section 564(b)(1) of the Act, 21 U.S.C. section 360bbb-3(b)(1), unless the authorization is terminated or revoked.  Performed at [facility], [address] [place], [place] [address]   Blood Culture (routine x 2)     Status: None (Preliminary result)   Collection Time: 07/21/21 10:55 PM   Specimen: Porta Cath; Blood  Result Value Ref Range Status   Specimen Description PORTA CATH  Final   Special Requests   Final    BOTTLES DRAWN AEROBIC AND ANAEROBIC Blood Culture results may not be optimal due to an inadequate volume of blood received in culture bottles   Culture   Final    NO GROWTH < 12 HOURS Performed at [facility], [address], [place], [place] [address]    Report Status PENDING  Incomplete  Blood Culture (routine x 2)     Status: None (Preliminary result)   Collection Time: 07/21/21 11:16 PM   Specimen: BLOOD [name] FOREARM  Result Value Ref Range Status   Specimen Description BLOOD [name] FOREARM  Final   Special Requests   Final    BOTTLES DRAWN AEROBIC ONLY Blood Culture adequate volume   Culture   Final    NO GROWTH < 12 HOURS Performed at [facility], [address], [place], [place] [address]    Report Status PENDING  Incomplete    Radiology Reports DG Chest Port 1 View  Result Date: 07/21/2021 CLINICAL DATA:  Shortness of breath. EXAM: PORTABLE CHEST 1 VIEW COMPARISON:  July 12, 2021 FINDINGS: There is stable [name]-sided  venous Port-A-Cath positioning. The lungs are hyperinflated. There is marked severity emphysematous lung disease with stable chronic, diffusely increased interstitial lung markings. Mild, stable atelectasis is seen within the bilateral lung bases. There is no evidence of a pleural effusion or pneumothorax. The heart size and mediastinal contours are within normal limits. Prior vertebroplasty is seen within the mid to lower thoracic spine and upper lumbar spine. IMPRESSION: Stable exam, without acute cardiopulmonary disease. Electronically Signed   By: Virgina Norfolk M.D.   On: 07/21/2021 23:21    SIGNED: Deatra James, MD, FHM. [facility],  Pager (please use [email or web address] to page/text) Please use Epic Secure Chat for non-urgent communication (7AM-7PM)  If 7PM-7AM, please contact night-coverage [email or web address], 07/22/2021, 12:05 PM

## 2021-07-22 NOTE — Assessment & Plan Note (Addendum)
-   Hypotensive, generalized weakness due to symptomatic anemia ?Patient reports melena since his last discharge ?Presented with Hgb 5.4 ?Received 4 units PRBC, 3 units platelets this admission ?3/8 EGD--Localized hemorrhagic mucosa with active bleeding and with stigmata of bleeding was found in the proximal jejunum. Upon lavage it looked as a vascularized area, ?possible Dieulafoy vs AVM.  Hemoclip placed ?Hgb remains stable since transfusion--9.2 on day of d/c ? ?

## 2021-07-22 NOTE — ED Notes (Signed)
MD notified re: pts platelets, order to be placed for platelet transfusion ?

## 2021-07-22 NOTE — Assessment & Plan Note (Addendum)
Patient wears 3 L nasal cannula baseline ?Presents to the ED with hypoxic with a O2 sat of 87% ?Normalized on 4 L nasal cannula ?Secondary to anemia ?Now back to 3L ?

## 2021-07-22 NOTE — Plan of Care (Signed)

## 2021-07-22 NOTE — H&P (Signed)
History and Physical    Patient: [name] VEH:[id number] DOB: April 15, 1930 DOA: 07/21/2021 DOS: the patient was seen and examined on 07/22/2021 PCP: Sharilyn Sites, MD  Patient coming from: Home  Chief Complaint:  Chief Complaint  Patient presents with   Shortness of Breath   HPI: [name] is a 88 y.o. male with medical history significant of with history of bone cancer currently getting chemo, CKD, COPD, GERD presents to ED with a chief complaint of dizziness, weakness, dyspnea.  Patient reports that he was post to go to the cancer center yesterday, but he was feeling dizzy and seeing double.  He sat down to see if rest would make it go away but it did not.  He progressively became weaker throughout the day.  He can even ambulate to make himself something to eat.  He reports exertional dyspnea as well.  He tried his inhalers and they did not help.  He denies any chest pain or abdominal pain.  He denies any hematochezia.  He does report that the first vomiting he had when he got home from his last hospitalization was black and bloody.  Since then he has had at least 2 melanotic stools per day.  He reports that he thought that that was just irritation from the previous scopes on last admission.  Patient was just discharged from the hospital on March 1.  At that time he presented with symptomatic anemia as well.  He has had numerous transfusions with packed red blood cells every 2-4 weeks.  He received a total of 5 units in the last admission with the most recent being 2 units on February 28.  GI was consulted and EGD was done.  There was persistent oozing blood and 1 hemostatic clip was successfully placed.  The area was injected with epinephrine for hemostasis as well.  Hemoglobin at discharge was 9.5.  Today's presentation is very similar to the last presentation. Patient reports he does not smoke, does not drink alcohol, does not use illicit drugs.  He is vaccinated for COVID.  Patient is  partial code.  He does not want CPR. Review of Systems: As mentioned in the history of present illness. All other systems reviewed and are negative. Past Medical History:  Diagnosis Date   Bone cancer ([place])    Chronic kidney insufficiency    COPD (chronic obstructive pulmonary disease) (HCC)    GERD (gastroesophageal reflux disease)    Pulmonary nodule    Past Surgical History:  Procedure Laterality Date   BACK SURGERY     BIOPSY  07/15/2021   Procedure: BIOPSY;  Surgeon: Harvel Quale, MD;  [facility];  Service: Gastroenterology;;   CATARACT EXTRACTION W/PHACO Right 03/09/2016   Procedure: CATARACT EXTRACTION PHACO AND INTRAOCULAR LENS PLACEMENT RIGHT EYE CDE=8.58;  Surgeon: Tonny Branch, MD;  Location: [facility];  Service: Ophthalmology;  Laterality: Right;  right   CATARACT EXTRACTION W/PHACO Left 04/13/2016   Procedure: CATARACT EXTRACTION PHACO AND INTRAOCULAR LENS PLACEMENT (IOC);  Surgeon: Tonny Branch, MD;  Location: [facility];  Service: Ophthalmology;  Laterality: Left;  CDE: 8.08   ESOPHAGOGASTRODUODENOSCOPY (EGD) WITH PROPOFOL N/A 07/14/2021   Procedure: ESOPHAGOGASTRODUODENOSCOPY (EGD) WITH PROPOFOL;  Surgeon: Eloise Harman, DO;  [facility];  Service: Endoscopy;  Laterality: N/A;   ESOPHAGOGASTRODUODENOSCOPY (EGD) WITH PROPOFOL N/A 07/15/2021   Procedure: ESOPHAGOGASTRODUODENOSCOPY (EGD) WITH PROPOFOL;  Surgeon: Harvel Quale, MD;  [facility];  Service: Gastroenterology;  Laterality: N/A;  HEMOSTASIS CLIP PLACEMENT  07/15/2021   Procedure: HEMOSTASIS CLIP PLACEMENT;  Surgeon: Harvel Quale, MD;  [facility];  Service: Gastroenterology;;   KYPHOPLASTY Bilateral 06/09/2019   Procedure: T11 KYPHOPLASTY;  Surgeon: Consuella Lose, MD;  Location: [facility];  Service: Neurosurgery;  Laterality: Bilateral;   None to Date  10/01/15   PORTACATH PLACEMENT Left 10/11/2020   Procedure: INSERTION PORT-A-CATH  (attached catheter in left internal jugular);  Surgeon: Virl Cagey, MD;  Location: [facility];  Service: General;  Laterality: Left;   Social History:  reports that he quit smoking about 30 years ago. His smoking use included cigarettes and cigars. He has never used smokeless tobacco. He reports that he does not currently use alcohol. He reports that he does not use drugs.  No Known Allergies  Family History  Problem Relation Age of Onset   Kidney disease Mother    Kidney disease Sister    Colon cancer Neg Hx    Gastric cancer Neg Hx    Esophageal cancer Neg Hx     Prior to Admission medications   Medication Sig Start Date End Date Taking? Authorizing Provider  albuterol (VENTOLIN HFA) 108 (90 Base) MCG/ACT inhaler Inhale 2 puffs into the lungs every 4 (four) hours as needed for wheezing or shortness of breath. 04/04/20   Roxan Hockey, MD  midodrine (PROAMATINE) 2.5 MG tablet Take 1 tablet (2.5 mg total) by mouth 3 (three) times daily with meals. 06/23/21   Derek Jack, MD  pantoprazole (PROTONIX) 40 MG tablet Take 1 tablet (40 mg total) by mouth 2 (two) times daily. 07/16/21 07/16/22  ShahmehdiValeria Batman, MD  silodosin (RAPAFLO) 4 MG CAPS capsule Take 4 mg by mouth at bedtime. 06/27/21   [provider]    Physical Exam: Vitals:   07/22/21 0430 07/22/21 0445 07/22/21 0601 07/22/21 0619  BP: (!) 102/47  (!) 91/52 (!) 95/52  Pulse:  76 78 79  Resp: '13 13 20 19  '$ Temp:   (!) 97.3 F (36.3 C) (!) 97.4 F (36.3 C)  TempSrc:   Axillary Oral  SpO2:  100% 99% 100%  Weight:      Height:       1.  General: Patient lying supine in bed, chronically ill-appearing, no acute distress   2. Psychiatric: Alert and oriented x 3, mood and behavior normal for situation, exceedingly pleasant and cooperative with exam   3. Neurologic: Speech and language are normal, face is symmetric, moves all 4 extremities voluntarily, at baseline without acute deficits on limited exam    4. HEENMT:  Head is atraumatic, normocephalic, pupils reactive to light, conjunctival pallor, neck is cachectic, trachea is midline, mucous membranes are moist   5. Respiratory : Lungs are clear to auscultation bilaterally without wheezing, rhonchi, rales, no cyanosis, no increase in work of breathing or accessory muscle use   6. Cardiovascular : Heart rate normal, rhythm is regular, no murmurs, rubs or gallops, no peripheral edema, peripheral pulses palpated   7. Gastrointestinal:  Abdomen is soft, nondistended, nontender to palpation bowel sounds active, no masses or organomegaly palpated   8. Skin:  Skin is pale, warm, dry and intact without rashes, acute lesions, or ulcers on limited exam   9.Musculoskeletal:  No acute deformities or trauma, no asymmetry in tone, no peripheral edema, peripheral pulses palpated, no tenderness to palpation in the extremities  Data Reviewed: In the ED Temp 97.7, heart rate 91-97, respiratory rate 14-18, blood pressure 84/59-108/48 Initially satting  87%, improved to 94% on 4 L nasal cannula CBC shows a hemoglobin of 5.4 Chemistry shows a potassium of 3.4 BUN of 66 and a creatinine of 1.9 Albumin is 2.5 Initial lactic acid is 2.3, repeat 1.4 BC smear showed polychromasia, schistocytes, teardrop cells, left shift FOBT positive Blood cultures are pending as there was initially a code sepsis with the hypotension Chest x-ray stable without acute cardiopulmonary disease  EKG shows a heart rate of 87, sinus rhythm, QTc 446 Patient was made n.p.o., given albuterol, Protonix 40, 1.75 L bolus ordered, and LR 150 mL/h continued -slowing down fluids as to not over dilute patient with already low hemoglobin.   Assessment and Plan: * Acute on chronic respiratory failure with hypoxia [facility]) Patient wears 3 L nasal cannula baseline Presents to the ED with hypoxic with a O2 sat of 87% Normalized on 4 L nasal cannula Secondary to anemia Transfuse as  above Continue to monitor  Hypokalemia Replace and recheck in the a.m.  AKI (acute kidney injury) ([place]) Creatinine at baseline 1.3-1.4 Creatinine today 1.89 Secondary to anemia with a hemoglobin of 5.4 Transfuse as above, trend in the a.m.  Protein-calorie malnutrition, moderate (HCC) Albumin 2.5 Patient reports that he has not been able to eat at home because he can even get up to make himself food, then when he has food ordered and he is not hungry Encourage nutrient dense food choices when patient is able to take p.o. Consider adding Ensure shakes when patient is able to take p.o.  Symptomatic anemia Patient reports melena since his last discharge Hemoglobin 5.4 2 units transfusion ordered Patient complains of dyspnea and has a higher oxygen requirement than baseline, and was also hypotensive at arrival Trend CBC Consult GI  COPD (chronic obstructive pulmonary disease) (HCC) Baseline 3 L oxygen requirement Continue albuterol as needed        Advance Care Planning:   Code Status: Partial Code does not want CPR  Consults: GI  Family Communication: No family at bedside  Severity of Illness: The appropriate patient status for this patient is INPATIENT. Inpatient status is judged to be reasonable and necessary in order to provide the required intensity of service to ensure the patient's safety. The patient's presenting symptoms, physical exam findings, and initial radiographic and laboratory data in the context of their chronic comorbidities is felt to place them at high risk for further clinical deterioration. Furthermore, it is not anticipated that the patient will be medically stable for discharge from the hospital within 2 midnights of admission.   * I certify that at the point of admission it is my clinical judgment that the patient will require inpatient hospital care spanning beyond 2 midnights from the point of admission due to high intensity of service, high risk  for further deterioration and high frequency of surveillance required.*  Author: Rolla Plate, DO 07/22/2021 6:49 AM  For on call review [email or web address].

## 2021-07-22 NOTE — Assessment & Plan Note (Deleted)
-   Likely due to severe anemia, hypovolemia ?-Continue with 2U PRBC blood transfusion, IV fluids ?-We will initiate midodrine if needed ?-Currently BP improving ?

## 2021-07-23 ENCOUNTER — Inpatient Hospital Stay (HOSPITAL_COMMUNITY): Payer: Medicare Other | Admitting: Certified Registered Nurse Anesthetist

## 2021-07-23 ENCOUNTER — Ambulatory Visit (HOSPITAL_COMMUNITY): Payer: Medicare Other

## 2021-07-23 ENCOUNTER — Encounter (HOSPITAL_COMMUNITY)
Admission: EM | Disposition: A | Discharge status: Home / Self Care | Payer: Self-pay | Source: Home / Self Care | Attending: Internal Medicine

## 2021-07-23 ENCOUNTER — Encounter (HOSPITAL_COMMUNITY): Payer: Self-pay | Admitting: Family Medicine

## 2021-07-23 DIAGNOSIS — K921 Melena: Secondary | ICD-10-CM

## 2021-07-23 DIAGNOSIS — Z7189 Other specified counseling: Secondary | ICD-10-CM | POA: Diagnosis not present

## 2021-07-23 DIAGNOSIS — K922 Gastrointestinal hemorrhage, unspecified: Secondary | ICD-10-CM

## 2021-07-23 DIAGNOSIS — K2970 Gastritis, unspecified, without bleeding: Secondary | ICD-10-CM

## 2021-07-23 DIAGNOSIS — K2289 Other specified disease of esophagus: Secondary | ICD-10-CM

## 2021-07-23 DIAGNOSIS — N1832 Chronic kidney disease, stage 3b: Secondary | ICD-10-CM | POA: Diagnosis not present

## 2021-07-23 DIAGNOSIS — J9621 Acute and chronic respiratory failure with hypoxia: Secondary | ICD-10-CM

## 2021-07-23 DIAGNOSIS — N189 Chronic kidney disease, unspecified: Secondary | ICD-10-CM

## 2021-07-23 DIAGNOSIS — D631 Anemia in chronic kidney disease: Secondary | ICD-10-CM

## 2021-07-23 DIAGNOSIS — K2980 Duodenitis without bleeding: Secondary | ICD-10-CM

## 2021-07-23 DIAGNOSIS — K2991 Gastroduodenitis, unspecified, with bleeding: Secondary | ICD-10-CM

## 2021-07-23 DIAGNOSIS — J449 Chronic obstructive pulmonary disease, unspecified: Secondary | ICD-10-CM | POA: Diagnosis not present

## 2021-07-23 DIAGNOSIS — R195 Other fecal abnormalities: Secondary | ICD-10-CM

## 2021-07-23 DIAGNOSIS — D62 Acute posthemorrhagic anemia: Secondary | ICD-10-CM | POA: Diagnosis not present

## 2021-07-23 DIAGNOSIS — N179 Acute kidney failure, unspecified: Secondary | ICD-10-CM | POA: Diagnosis not present

## 2021-07-23 DIAGNOSIS — K2901 Acute gastritis with bleeding: Secondary | ICD-10-CM

## 2021-07-23 DIAGNOSIS — Z515 Encounter for palliative care: Secondary | ICD-10-CM

## 2021-07-23 HISTORY — PX: ESOPHAGOGASTRODUODENOSCOPY (EGD) WITH PROPOFOL: SHX5813

## 2021-07-23 HISTORY — PX: ENTEROSCOPY: SHX5533

## 2021-07-23 HISTORY — PX: HEMOSTASIS CLIP PLACEMENT: SHX6857

## 2021-07-23 LAB — TYPE AND SCREEN
ABO/RH(D): B NEG
Antibody Screen: NEGATIVE
Unit division: 0
Unit division: 0
Unit division: 0
Unit division: 0

## 2021-07-23 LAB — CBC
HCT: 27.5 % — ABNORMAL LOW (ref 39.0–52.0)
Hemoglobin: 9.2 g/dL — ABNORMAL LOW (ref 13.0–17.0)
MCH: 28.4 pg (ref 26.0–34.0)
MCHC: 33.5 g/dL (ref 30.0–36.0)
MCV: 84.9 fL (ref 80.0–100.0)
Platelets: 61 10*3/uL — ABNORMAL LOW (ref 150–400)
RBC: 3.24 MIL/uL — ABNORMAL LOW (ref 4.22–5.81)
RDW: 15.9 % — ABNORMAL HIGH (ref 11.5–15.5)
WBC: 7.4 10*3/uL (ref 4.0–10.5)
nRBC: 0.8 % — ABNORMAL HIGH (ref 0.0–0.2)

## 2021-07-23 LAB — BPAM RBC
Blood Product Expiration Date: 202304032359
Blood Product Expiration Date: 202304072359
Blood Product Expiration Date: 202304102359
Blood Product Expiration Date: 202304122359
ISSUE DATE / TIME: 202303070148
ISSUE DATE / TIME: 202303070547
ISSUE DATE / TIME: 202303071939
ISSUE DATE / TIME: 202303072132
Unit Type and Rh: 1700
Unit Type and Rh: 1700
Unit Type and Rh: 1700
Unit Type and Rh: 9500

## 2021-07-23 LAB — PREPARE PLATELET PHERESIS
Unit division: 0
Unit division: 0

## 2021-07-23 LAB — BPAM PLATELET PHERESIS
Blood Product Expiration Date: 202303082359
Blood Product Expiration Date: 202303092359
ISSUE DATE / TIME: 202303071517
ISSUE DATE / TIME: 202303071706
Unit Type and Rh: 5100
Unit Type and Rh: 7300

## 2021-07-23 SURGERY — ESOPHAGOGASTRODUODENOSCOPY (EGD) WITH PROPOFOL
Anesthesia: General

## 2021-07-23 MED ORDER — PHENYLEPHRINE HCL (PRESSORS) 10 MG/ML IV SOLN
INTRAVENOUS | Status: DC | PRN
  Administered 2021-07-23: 80 ug via INTRAVENOUS
  Administered 2021-07-23: 120 ug via INTRAVENOUS
  Administered 2021-07-23: 80 ug via INTRAVENOUS
  Administered 2021-07-23 (×3): 120 ug via INTRAVENOUS

## 2021-07-23 MED ORDER — LIDOCAINE HCL (CARDIAC) PF 100 MG/5ML IV SOSY
PREFILLED_SYRINGE | INTRAVENOUS | Status: DC | PRN
  Administered 2021-07-23: 40 mg via INTRAVENOUS

## 2021-07-23 MED ORDER — PROPOFOL 10 MG/ML IV BOLUS
INTRAVENOUS | Status: DC | PRN
  Administered 2021-07-23: 30 mg via INTRAVENOUS
  Administered 2021-07-23: 10 mg via INTRAVENOUS
  Administered 2021-07-23: 20 mg via INTRAVENOUS
  Administered 2021-07-23: 30 mg via INTRAVENOUS
  Administered 2021-07-23: 10 mg via INTRAVENOUS

## 2021-07-23 MED ORDER — PHENYLEPHRINE 40 MCG/ML (10ML) SYRINGE FOR IV PUSH (FOR BLOOD PRESSURE SUPPORT)
PREFILLED_SYRINGE | INTRAVENOUS | Status: AC
  Filled 2021-07-23: qty 10

## 2021-07-23 MED ORDER — LACTATED RINGERS IV SOLN: INTRAVENOUS | Status: DC

## 2021-07-23 NOTE — Assessment & Plan Note (Addendum)
Has been chronic due to MDS,  30-40K range ?-received 2 units platelets this admission ?-3/10 transfuse another unit PRBC (3 total for the admission) ?

## 2021-07-23 NOTE — Assessment & Plan Note (Signed)
Stable on 3L currently ?Continue albuterol prn which is his only listed med for COPD ?

## 2021-07-23 NOTE — Progress Notes (Signed)
PROGRESS NOTE  [name] ELF:[id number] DOB: 1930-04-14 DOA: 07/21/2021 PCP: Sharilyn Sites, MD  Brief History:   [name] is a 88 y.o. male with medical history significant of with history of bone cancer currently getting chemo, CKD, COPD, GERD presents to ED with a chief complaint of dizziness, weakness, dyspnea.     Patient reports that he was post to go to the cancer center 07/21/21, but he was feeling dizzy and seeing double.  He sat down to see if rest would make it go away but it did not.  He progressively became weaker throughout the day.  He can even ambulate to make himself something to eat.  He reports exertional dyspnea as well.  He tried his inhalers and they did not help.  He denies any chest pain or abdominal pain.  He denies any hematochezia.  He does report that the first vomiting he had when he got home from his last hospitalization was black and bloody.  Since then he has had at least 2 melanotic stools per day.  He reports that he thought that that was just irritation from the previous scopes on last admission.  Patient was just discharged from the hospital on March 1.  At that time he presented with symptomatic anemia as well.  He has had numerous transfusions with packed red blood cells every 2-4 weeks.  He received a total of 5 units in the last admission.  s/p EGD 2/28 with clotted blood and GE junction biopsied, injected, clip placed, sloughing of mucosa in the esophagus, gastritis.  Surgical pathology showed presence of necroinflammatory debris but no other abnormalities.     Today's presentation is very similar to the last presentation. Patient reports he does not smoke, does not drink alcohol, does not use illicit drugs.   He is vaccinated for COVID.   Patient is partial code.  He does not want CPR.  In the ED Temp 97.7, heart rate 91-97, respiratory rate 14-18, blood pressure 84/59-108/48 Initially satting 87%, improved to 94% on 4 L nasal  cannula CBC shows a hemoglobin of 5.4 potassium of 3.4 BUN of 66 and a creatinine of 1.9 Albumin is 2.5 Initial lactic acid is 2.3, repeat 1.4 BC smear showed polychromasia, schistocytes, teardrop cells, left shift FOBT positive Blood cultures are pending as there was initially a code sepsis with the hypotension Chest x-ray stable without acute cardiopulmonary disease  EKG shows a heart rate of 87, sinus rhythm, QTc 446     Assessment and Plan: * Acute on chronic blood loss anemia - Hypotensive, generalized weakness due to symptomatic anemia Patient reports melena since his last discharge Presented with Hgb 5.4 Received 4 units PRBC, 2 units platelets this admission 3/8 EGD--Localized hemorrhagic mucosa with active bleeding and with stigmata of bleeding was found in the proximal jejunum. Upon lavage it looked as a vascularized area, ?possible Dieulafoy vs AVM.  Hemoclip placed   Acute on chronic respiratory failure with hypoxia [facility]) Patient wears 3 L nasal cannula baseline Presents to the ED with hypoxic with a O2 sat of 87% Normalized on 4 L nasal cannula Secondary to anemia Now back to 3L Continue to monitor  Myelodysplastic syndrome [facility]) Follows Dr. Delton Coombes 07/09/21 follow up with Dr. Delton Coombes states treatment of MDS on hold due to hospitalizations for syncope Previously on Azacitidine 09/16/20 to 04/21/21 3/8--discussed with Dr. Ernst Bowler plans to restart azacitidine  Thrombocytopenia [facility]) Has been chronic due  to MDS,  30-40K range -received 2 units platelets this admission Continue serial CBC  Hypokalemia Replace and rechecking today   Protein-calorie malnutrition, moderate (HCC) Body mass index is 17.04 kg/m. - Albumin 2.5 -Dietary consult Encourage nutrient dense food choices when patient is able to take p.o. Considering  adding Ensure shakes when patient is able to take p.o.  Heme positive stool GI consulted EGD on 3/8 as discussed above  CKD  (chronic kidney disease), stage IIIB Baseline creatinine 1.5-1.8 Serial BMPs  COPD (chronic obstructive pulmonary disease) (HCC) Baseline 3 L oxygen requirement Continue albuterol as needed Stable    Hypotensive episode-resolved as of 07/23/2021 - Likely due to severe anemia, hypovolemia -Continue with 2U PRBC blood transfusion, IV fluids -We will initiate midodrine if needed -Currently BP improving         Status is: Inpatient Remains inpatient appropriate because: hemodynamic instability and severity of illness    Family Communication:   daughter updated 3/8  Consultants:  GI, palliative  Code Status:  Partial DNR--no CPR, yes all other modalities  DVT Prophylaxis:  SCDs   Procedures: As Listed in Progress Note Above  Antibiotics: None      Subjective: Patient denies fevers, chills, headache, chest pain, dyspnea, nausea, vomiting, diarrhea, abdominal pain, dysuria, hematuria, hematochezia, and melena.   Objective: Vitals:   07/23/21 1459 07/23/21 1501 07/23/21 1503 07/23/21 1509  BP: (!) 73/45 (!) 82/49 (!) 90/54 (!) 90/54  Pulse: 64 62 61 61  Resp: (!) 21 (!) '22 20 18  '$ Temp: 97.7 F (36.5 C)   (!) 97.2 F (36.2 C)  TempSrc:    Axillary  SpO2: 100% 100% 100% 100%  Weight:      Height:        Intake/Output Summary (Last 24 hours) at 07/23/2021 1730 Last data filed at 07/23/2021 1456 Gross per 24 hour  Intake 1467.95 ml  Output 1250 ml  Net 217.95 ml   Weight change: -4 kg Exam:  General:  Pt is alert, follows commands appropriately, not in acute distress HEENT: No icterus, No thrush, No neck mass, [place]/AT Cardiovascular: RRR, S1/S2, no rubs, no gallops Respiratory: fine bibasilar rales. No wheeze Abdomen: Soft/+BS, non tender, non distended, no guarding Extremities: No edema, No lymphangitis, No petechiae, No rashes, no synovitis   Data Reviewed: I have personally reviewed following labs and imaging studies Basic Metabolic Panel: Recent  Labs  Lab 07/21/21 2255 07/22/21 1204  NA 137 139  K 3.4* 3.9  CL 108 116*  CO2 22 19*  GLUCOSE 113* 92  BUN 66* 60*  CREATININE 1.89* 1.57*  CALCIUM 8.7* 8.2*  MG  --  1.8   Liver Function Tests: Recent Labs  Lab 07/21/21 2255 07/22/21 1204  AST 18 17  ALT 15 14  ALKPHOS 50 41  BILITOT 0.5 0.5  PROT 5.8* 4.6*  ALBUMIN 2.5* 2.1*   No results for input(s): LIPASE, AMYLASE in the last 168 hours. No results for input(s): AMMONIA in the last 168 hours. Coagulation Profile: Recent Labs  Lab 07/21/21 2255  INR 1.1   CBC: Recent Labs  Lab 07/21/21 2255 07/22/21 1204 07/22/21 1755 07/23/21 0031  WBC 11.0* 6.4 10.9* 7.4  NEUTROABS 7.2  --   --   --   HGB 5.4* 7.1* 7.2* 9.2*  HCT 17.4* 21.8* 22.6* 27.5*  MCV 86.6 87.6 87.3 84.9  PLT 45* 29* 64* 61*   Cardiac Enzymes: No results for input(s): CKTOTAL, CKMB, CKMBINDEX, TROPONINI in the last 168 hours.  BNP: Invalid input(s): POCBNP CBG: No results for input(s): GLUCAP in the last 168 hours. HbA1C: No results for input(s): HGBA1C in the last 72 hours. Urine analysis:    Component Value Date/Time   COLORURINE STRAW (A) 07/22/2021 0515   APPEARANCEUR CLEAR 07/22/2021 0515   APPEARANCEUR [place] (A) 03/26/2021 1334   LABSPEC 1.012 07/22/2021 0515   PHURINE 5.0 07/22/2021 0515   GLUCOSEU NEGATIVE 07/22/2021 0515   HGBUR MODERATE (A) 07/22/2021 0515   BILIRUBINUR NEGATIVE 07/22/2021 0515   BILIRUBINUR Negative 03/26/2021 1334   KETONESUR NEGATIVE 07/22/2021 0515   PROTEINUR NEGATIVE 07/22/2021 0515   UROBILINOGEN 0.2 08/15/2013 1240   NITRITE NEGATIVE 07/22/2021 0515   LEUKOCYTESUR NEGATIVE 07/22/2021 0515   Sepsis Labs: '@LABRCNTIP'$ (procalcitonin:4,lacticidven:4) ) Recent Results (from the past 240 hour(s))  Resp Panel by RT-PCR (Flu A&B, Covid) Nasopharyngeal Swab     Status: None   Collection Time: 07/21/21 10:55 PM   Specimen: Nasopharyngeal Swab; Nasopharyngeal(NP) swabs in vial transport medium  Result  Value Ref Range Status   SARS Coronavirus 2 by RT PCR NEGATIVE NEGATIVE Final    Comment: (NOTE) SARS-CoV-2 target nucleic acids are NOT DETECTED.  The SARS-CoV-2 RNA is generally detectable in upper respiratory specimens during the acute phase of infection. The lowest concentration of SARS-CoV-2 viral copies this assay can detect is 138 copies/mL. A negative result does not preclude SARS-Cov-2 infection and should not be used as the sole basis for treatment or other patient management decisions. A negative result may occur with  improper specimen collection/handling, submission of specimen other than nasopharyngeal swab, presence of viral mutation(s) within the areas targeted by this assay, and inadequate number of viral copies(<138 copies/mL). A negative result must be combined with clinical observations, patient history, and epidemiological information. The expected result is Negative.  Fact Sheet for Patients:  EntrepreneurPulse.com.au  Fact Sheet for Healthcare Providers:  IncredibleEmployment.be  This test is no t yet approved or cleared by the [place] FDA and  has been authorized for detection and/or diagnosis of SARS-CoV-2 by FDA under an Emergency Use Authorization (EUA). This EUA will remain  in effect (meaning this test can be used) for the duration of the COVID-19 declaration under Section 564(b)(1) of the Act, 21 U.S.C.section 360bbb-3(b)(1), unless the authorization is terminated  or revoked sooner.       Influenza A by PCR NEGATIVE NEGATIVE Final   Influenza B by PCR NEGATIVE NEGATIVE Final    Comment: (NOTE) The Xpert Xpress SARS-CoV-2/FLU/RSV plus assay is intended as an aid in the diagnosis of influenza from Nasopharyngeal swab specimens and should not be used as a sole basis for treatment. Nasal washings and aspirates are unacceptable for Xpert Xpress SARS-CoV-2/FLU/RSV testing.  Fact Sheet for  Patients: EntrepreneurPulse.com.au  Fact Sheet for Healthcare Providers: IncredibleEmployment.be  This test is not yet approved or cleared by the [place] FDA and has been authorized for detection and/or diagnosis of SARS-CoV-2 by FDA under an Emergency Use Authorization (EUA). This EUA will remain in effect (meaning this test can be used) for the duration of the COVID-19 declaration under Section 564(b)(1) of the Act, 21 U.S.C. section 360bbb-3(b)(1), unless the authorization is terminated or revoked.  Performed at [facility], [address] [place], [place] [address]   Blood Culture (routine x 2)     Status: None (Preliminary result)   Collection Time: 07/21/21 10:55 PM   Specimen: Porta Cath; Blood  Result Value Ref Range Status   Specimen Description PORTA CATH  Final  Special Requests   Final    BOTTLES DRAWN AEROBIC AND ANAEROBIC Blood Culture results may not be optimal due to an inadequate volume of blood received in culture bottles   Culture   Final    NO GROWTH 2 DAYS Performed at [facility], [address] [place], [place] [address]    Report Status PENDING  Incomplete  Blood Culture (routine x 2)     Status: None (Preliminary result)   Collection Time: 07/21/21 11:16 PM   Specimen: BLOOD LEFT FOREARM  Result Value Ref Range Status   Specimen Description BLOOD LEFT FOREARM  Final   Special Requests   Final    BOTTLES DRAWN AEROBIC ONLY Blood Culture adequate volume   Culture   Final    NO GROWTH 2 DAYS Performed at [facility], [address] [place], [place] [address]    Report Status PENDING  Incomplete  MRSA Next Gen by PCR, Nasal     Status: None   Collection Time: 07/22/21  2:47 PM   Specimen: Nasal Mucosa; Nasal Swab  Result Value Ref Range Status   MRSA by PCR Next Gen NOT DETECTED NOT DETECTED Final    Comment: (NOTE) The GeneXpert MRSA Assay (FDA approved for NASAL specimens only), is one component of a  comprehensive MRSA colonization surveillance program. It is not intended to diagnose MRSA infection nor to guide or monitor treatment for MRSA infections. Test performance is not FDA approved in patients less than 44 years old. Performed at [facility], [address], [place], [place] [address]      Scheduled Meds:  sodium chloride   Intravenous Once   Chlorhexidine Gluconate Cloth  6 each Topical Daily   mouth rinse  15 mL Mouth Rinse BID   midodrine  2.5 mg Oral TID WC   [START ON 07/25/2021] pantoprazole  40 mg Intravenous Q12H   [START ON 07/26/2021] pantoprazole  40 mg Intravenous Q12H   Continuous Infusions:  sodium chloride     pantoprazole Stopped (07/23/21 1425)    Procedures/Studies: DG Chest Port 1 View  Result Date: 07/21/2021 CLINICAL DATA:  Shortness of breath. EXAM: PORTABLE CHEST 1 VIEW COMPARISON:  July 12, 2021 FINDINGS: There is stable left-sided venous Port-A-Cath positioning. The lungs are hyperinflated. There is marked severity emphysematous lung disease with stable chronic, diffusely increased interstitial lung markings. Mild, stable atelectasis is seen within the bilateral lung bases. There is no evidence of a pleural effusion or pneumothorax. The heart size and mediastinal contours are within normal limits. Prior vertebroplasty is seen within the mid to lower thoracic spine and upper lumbar spine. IMPRESSION: Stable exam, without acute cardiopulmonary disease. Electronically Signed   By: Virgina Norfolk M.D.   On: 07/21/2021 23:21   DG Chest Port 1 View  Result Date: 07/12/2021 CLINICAL DATA:  Severe shortness of breath and weakness for 1 day. Near syncopal episode. COPD. EXAM: PORTABLE CHEST 1 VIEW COMPARISON:  06/16/2021 FINDINGS: Left-sided power port remains in appropriate position. Heart size is stable. Marked pulmonary hyperinflation is again demonstrated. Coarse diffuse interstitial infiltrates are unchanged. No evidence of superimposed pulmonary  airspace disease or pleural effusion. Lower thoracic spine vertebroplasties again noted. IMPRESSION: Stable COPD and chronic interstitial lung disease. No acute findings. Electronically Signed   By: Marlaine Hind M.D.   On: 07/12/2021 14:16    Orson Eva, DO  [facility]  If 7PM-7AM, please contact night-coverage [email or web address] Password [email or web address] 07/23/2021, 5:30 PM   LOS: 1 day

## 2021-07-23 NOTE — Op Note (Signed)
[facility] ?Patient Name: [name] ?Procedure Date: 07/23/2021 2:26 PM ?MRN: [id number] ?Date of Birth: [birth year] ?Attending MD: Maylon Peppers ,  ?CSN: [id number] ?Age: 88 ?Admit Type: Inpatient ?Procedure:                Small bowel enteroscopy ?Indications:              Melena ?Providers:                Maylon Peppers, Hughie Closs RN, RN, Crystal  ?                          Page, Randa Spike, Technician ?Referring MD:              ?Medicines:                Monitored Anesthesia Care ?Complications:            No immediate complications. ?Estimated Blood Loss:     Estimated blood loss: none. ?Procedure:                Pre-Anesthesia Assessment: ?                          - Prior to the procedure, a History and Physical  ?                          was performed, and patient medications, allergies  ?                          and sensitivities were reviewed. The patient's  ?                          tolerance of previous anesthesia was reviewed. ?                          - The risks and benefits of the procedure and the  ?                          sedation options and risks were discussed with the  ?                          patient. All questions were answered and informed  ?                          consent was obtained. ?                          - ASA Grade Assessment: III - A patient with severe  ?                          systemic disease. ?                          After obtaining informed consent, the endoscope was  ?                          passed under direct vision. Throughout the  ?  procedure, the patient's blood pressure, pulse, and  ?                          oxygen saturations were monitored continuously. The  ?                          [id number]-[id number] ([id number]) scope was introduced through the  ?                          mouth, and advanced to the second part of duodenum.  ?                          After obtaining informed consent, the endoscope was  ?                           passed under direct vision. Throughout the  ?                          procedure, the patient's blood pressure, pulse, and  ?                          oxygen saturations were monitored continuously. The  ?                          [id number]) scope was introduced through  ?                          the mouth and advanced to the proximal ileum. The  ?                          small bowel enteroscopy was accomplished without  ?                          difficulty. The patient tolerated the procedure  ?                          well. ?Scope In: 2:37:50 PM ?Scope Out: 2:53:42 PM ?Total Procedure Duration: 0 hours 15 minutes 52 seconds  ?Findings: ?     Clotted blood was found at the gastroesophageal junction, which was  ?     firmly adhered to the esophagus. ?     The exam of the esophagus was otherwise normal. ?     Patchy mild inflammation characterized by erythema was found in the  ?     gastric body. ?     Localized inflammation, characterized by congestion (edema) and erythema  ?     was found in the first portion of the duodenum. ?     The exam of the duodenum was otherwise normal. ?     Localized hemorrhagic mucosa with active bleeding and with stigmata of  ?     bleeding was found in the proximal jejunum. Upon lavage it looked as a  ?     vascularized area, ?possible Dieulafoy. No clear ulceration or vessel  ?     was observed. To stop active bleeding, one hemostatic clip was  ?  successfully placed. There was no bleeding at the end of the procedure. ?     Note: vascular lesion could be a small Dieulafoy vs AVM exacerbated GI  ?     bleeding from azaticidine. ?Impression:               - Clotted blood at the gastroesophageal junction. ?                          - Gastritis. ?                          - Duodenitis. ?                          - Hemorrhagic jejunum. ?                          - No specimens collected. ?Moderate Sedation: ?     Per Anesthesia Care ?Recommendation:            - Return patient to hospital ward for ongoing care. ?                          - Clear liquid diet today. ?                          - Use Protonix (pantoprazole) 40 mg PO BID. ?                          - H/H every day. ?Procedure Code(s):        --- Professional --- ?                          [phone], Small intestinal endoscopy, enteroscopy  ?                          beyond second portion of duodenum, including ileum;  ?                          with control of bleeding (eg, injection, bipolar  ?                          cautery, unipolar cautery, laser, heater probe,  ?                          stapler, plasma coagulator) ?Diagnosis Code(s):        --- Professional --- ?                          K22.8, Other specified diseases of esophagus ?                          K29.70, Gastritis, unspecified, without bleeding ?                          K29.80, Duodenitis without bleeding ?  K92.2, Gastrointestinal hemorrhage, unspecified ?                          K92.1, Melena (includes Hematochezia) ?CPT copyright 2019 American Medical Association. All rights reserved. ?The codes documented in this report are preliminary and upon coder review may  ?be revised to meet current compliance requirements. ?Maylon Peppers, MD ?Maylon Peppers,  ?07/23/2021 3:05:14 PM ?This report has been signed electronically. ?Number of Addenda: 0 ?

## 2021-07-23 NOTE — TOC Initial Note (Signed)
Transition of Care (TOC) - Initial/Assessment Note  ? ? ?Patient Details  ?Name: [name] ?MRN: [id number] ?Date of Birth: 05-May-1930 ? ?Transition of Care (TOC) CM/SW Contact:    ?Salome Arnt, LCSW ?Phone Number: ?07/23/2021, 11:18 AM ? ?Clinical Narrative: Pt admitted with acute on chronic respiratory failure with hypoxia. Assessment completed due to high risk readmission score. Pt reports he lives alone and is independent with ADLs. He is on 3L home O2 [facility]). Pt ambulates with a cane or a walker at baseline. He drives himself to appointments. Discussed home health with pt and he is agreeable to PT/RN with no preference on agency. Referred and accepted by [facility] with Alvis Lemmings. TOC will continue to follow.                  ? ? ?Expected Discharge Plan: [place] ?Barriers to Discharge: Continued Medical Work up ? ? ?Patient Goals and CMS Choice ?Patient states their goals for this hospitalization and ongoing recovery are:: return home ?  ?Choice offered to / list presented to : Patient ? ?Expected Discharge Plan and Services ?Expected Discharge Plan: [place] ?In-house Referral: Clinical Social Work ?  ?Post Acute Care Choice: Home Health ?Living arrangements for the past 2 months: [place] ?                ?  ?  ?  ?  ?  ?HH Arranged: Therapist, sports, PT ?[place] Agency: [place] ?Date HH Agency Contacted: 07/23/21 ?Time [place]: [id number] ?Representative spoke with at [facility]: Tommi Rumps ? ?Prior Living Arrangements/Services ?Living arrangements for the past 2 months: [place] ?Lives with:: Self ?Patient language and need for interpreter reviewed:: Yes ?Do you feel safe going back to the place where you live?: Yes      ?Need for Family Participation in Patient Care: No (Comment) ?  ?Current home services: DME (cane, walker, O2) ?Criminal Activity/Legal Involvement Pertinent to Current Situation/Hospitalization: No - Comment as  needed ? ?Activities of Daily Living ?Home Assistive Devices/Equipment: Kasandra Knudsen (specify quad or straight), Grab bars in shower ?ADL Screening (condition at time of admission) ?Patient's cognitive ability adequate to safely complete daily activities?: Yes ?Is the patient deaf or have difficulty hearing?: No ?Does the patient have difficulty seeing, even when wearing glasses/contacts?: No ?Does the patient have difficulty concentrating, remembering, or making decisions?: No ?Patient able to express need for assistance with ADLs?: Yes ?Does the patient have difficulty dressing or bathing?: No ?Independently performs ADLs?: Yes (appropriate for developmental age) ?Does the patient have difficulty walking or climbing stairs?: No ?Weakness of Legs: None ?Weakness of Arms/Hands: None ? ?Permission Sought/Granted ?  ?  ?   ?   ?   ?   ? ?Emotional Assessment ?  ?  ?Affect (typically observed): Appropriate ?Orientation: : Oriented to Self, Oriented to Place, Oriented to  Time, Oriented to Situation ?Alcohol / Substance Use: Not Applicable ?Psych Involvement: No (comment) ? ?Admission diagnosis:  SOB (shortness of breath) [R06.02] ?Acute on chronic respiratory failure with hypoxia (HCC) [J96.21] ?Gastrointestinal hemorrhage associated with acute gastritis [K29.01] ?Symptomatic anemia [D64.9] ?Patient Active Problem List  ? Diagnosis Date Noted  ? Acute on chronic respiratory failure with hypoxia ([place]) 07/22/2021  ? AKI (acute kidney injury) ([place]) 07/22/2021  ? Hypokalemia 07/22/2021  ? Hypotensive episode 07/22/2021  ? Melena   ? Goals of care, counseling/discussion 07/15/2021  ? Protein-calorie malnutrition, moderate ([place])  07/14/2021  ? Heme positive stool 07/13/2021  ? Acute on chronic anemia 07/12/2021  ? Near syncope 06/17/2021  ? Neoplastic malignant related fatigue 01/21/2021  ? Depression 12/02/2020  ? 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  ? Hematuria 12/02/2020  ? Osteoporosis 12/02/2020  ? Low back pain 12/02/2020  ?  Syncope 11/11/2020  ? COVID-19 virus infection 11/11/2020  ? COPD (chronic obstructive pulmonary disease) ([place]) 04/03/2020  ? Symptomatic anemia 04/03/2020  ? 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 04/03/2020  ? Chronic respiratory failure with hypoxia /// 3L/min at baseline 04/03/2020  ? Urinary frequency 02/26/2020  ? Benign prostatic hyperplasia with urinary obstruction 02/26/2020  ? Myelodysplastic syndrome ([place]) 06/16/2019  ? HAP (hospital-acquired pneumonia) 06/16/2019  ? CKD (chronic kidney disease), stage IIIB 06/16/2019  ? Sepsis ([place]) 06/16/2019  ? Thrombocytopenia ([place]) 06/06/2019  ? Intractable back pain 06/06/2019  ? Chronic kidney insufficiency   ? Body mass index (BMI) 19.9 or less, adult 04/20/2019  ? Compression fracture of lumbar vertebra ([place]) 04/20/2019  ? Elevated blood-pressure reading, without diagnosis of hypertension 04/20/2019  ? Acute low back pain 03/23/2019  ? Pulmonary nodule 05/19/2018  ? Bronchiectasis ([place]) 05/19/2018  ? Anemia 09/17/2017  ? Abnormal CT of the abdomen 11/02/2016  ? Loss of weight 11/02/2016  ? History of colonic polyps 11/02/2016  ? GERD (gastroesophageal reflux disease) 10/01/2015  ? ?PCP:  Sharilyn Sites, MD ?Pharmacy:   ?[facility] [address] ?[address] [place] [address] ?Phone: [phone] Fax: [phone] ? ? ? ? ?Social Determinants of Health (SDOH) Interventions ?  ? ?Readmission Risk Interventions ?Readmission Risk Prevention Plan 07/23/2021  ?Transportation Screening Complete  ?[place] or Home Care Consult Complete  ?Social Work Consult for [place] Planning/Counseling Complete  ?Palliative Care Screening Complete  ?Medication Review Press photographer) Complete  ?Some recent data might be hidden  ? ? ? ?

## 2021-07-23 NOTE — Assessment & Plan Note (Addendum)
GI consulted ?EGD on 3/8 as discussed above ?No further melena at time of d/c ?

## 2021-07-23 NOTE — Plan of Care (Signed)

## 2021-07-23 NOTE — Progress Notes (Signed)
We will proceed with EGD as scheduled.  I thoroughly discussed with the patient his procedure, including the risks involved. Patient understands what the procedure involves including the benefits and any risks. Patient understands alternatives to the proposed procedure. Risks including (but not limited to) bleeding, tearing of the lining (perforation), rupture of adjacent organs, problems with heart and lung function, infection, and medication reactions. A small percentage of complications may require surgery, hospitalization, repeat endoscopic procedure, and/or transfusion.  Patient understood and agreed.  Adib Wahba Castaneda, MD Gastroenterology and Hepatology [facility]  

## 2021-07-23 NOTE — Consult Note (Signed)
? ?                                                                                ?Consultation Note ?Date: 07/23/2021  ? ?Patient Name: [name]  ?DOB: [birth year]  MRN: [id number]  Age / Sex: 88 y.o., male  ?PCP: Sharilyn Sites, MD ?Referring Physician: Orson Eva, MD ? ?Reason for Consultation: goals of care  ? ?HPI/Patient Profile: 88 y.o. male  with past medical history of MDS- was on AZA but this has been on hold and he has bene receiving frequent transfusions, CKD, COPD, GERD, recent admission for acute on chronic anemia d/t GI bleed - at that time had EGD with findings of a clot and persistent oozing and a clip was placed, now admitted on 07/21/2021 with shortness of breath. Chest xray showed chronic interstitial changes- no acute findings. FOC positive. Hgb 5.3. He has been admitted for re occurrence of acute on chronic anemia and GI bleed. Plan is for transfusion and repeat EGD. CBC today shows Hgb 9.2. Palliative medicine consulted for goals of care.   ? ?Primary Decision Maker ?PATIENT ? ?Discussion: ?I have reviewed medical records including EPIC notes, labs and imaging, assessed the patient and then met with the patient to discuss diagnosis prognosis, GOC, EOL wishes, disposition and options. ? ?Mr. Wolfrey met with my partner, Quinn Axe, NP last week on 2/28. At that time Experiment were determined to be to treat what is treatable in order to stabilize and return home.  ? ?I evaluated [name]- awake alert. Oriented.  ? ?Mr. Arnall remembered his discussion with Tasha. He readily tells me that nothing has changed. He is looking forward to completing the EGD today so that he can eat again. He continues to desire full scope treatment with limit set at no chest compressions. ? ?His daughter is en route from [place] and plans to arrive tomorrow.   ? ?SUMMARY OF RECOMMENDATIONS ?-Continue full scope treatment ?-Partial code- no chest compressions   ? ?Code Status/Advance Care Planning: ?Limited  code ? ? ?Prognosis:   ?Unable to determine ? ?Discharge Planning: To Be Determined ? ?Primary Diagnoses: ?Present on Admission: ? Acute on chronic respiratory failure with hypoxia ([place]) ? Symptomatic anemia ? COPD (chronic obstructive pulmonary disease) ([place]) ? AKI (acute kidney injury) ([place]) ? Protein-calorie malnutrition, moderate ([place]) ? Hypokalemia ? Hypotensive episode ? ? ?Review of Systems  ?Respiratory:  Negative for shortness of breath.   ?Psychiatric/Behavioral:  Negative for sleep disturbance.   ? ?Physical Exam ?Vitals and nursing note reviewed.  ?Pulmonary:  ?   Effort: Pulmonary effort is normal.  ?Skin: ?   Coloration: Skin is pale.  ?Neurological:  ?   Mental Status: He is alert.  ? ? ?Vital Signs: BP (!) 102/44   Pulse 64   Temp 97.8 ?F (36.6 ?C) (Axillary)   Resp 13   Ht 6' (1.829 m)   Wt 55.2 kg   SpO2 100%   BMI 16.50 kg/m?  ?Pain Scale: 0-10 ?  ?Pain Score: 0-No pain ? ? ?SpO2: SpO2: 100 % ?O2 Device:SpO2: 100 % ?O2 Flow Rate: .O2 Flow Rate (L/min): 4 L/min ? ?IO: Intake/output summary:  ?  Intake/Output Summary (Last 24 hours) at 07/23/2021 1025 ?Last data filed at 07/23/2021 0600 ?Gross per 24 hour  ?Intake 2438.46 ml  ?Output 1570 ml  ?Net 868.46 ml  ? ? ?LBM:   ?Baseline Weight: Weight: 57 kg ?Most recent weight: Weight: 55.2 kg     ?P ? ?Thank you for this consult. Palliative medicine will continue to follow and assist as needed.  ? ? ?Signed by: ?Mariana Kaufman, AGNP-C ?Palliative Medicine ? ?  ?Please contact Palliative Medicine Team phone at [phone] for questions and concerns.  ?For individual provider: See Amion ? ? ? ? ? ? ? ? ? ? ? ? ? ? ?

## 2021-07-23 NOTE — Assessment & Plan Note (Addendum)
Follows Dr. Delton Coombes ?07/09/21 follow up with Dr. Delton Coombes states treatment of MDS on hold due to hospitalizations for syncope ?Previously on Azacitidine 09/16/20 to 04/21/21 ?3/8--discussed with Dr. Ernst Bowler plans to restart azacitidine ?Pt has follow up with Dr. Delton Coombes on 07/28/21 at 8:15AM ?

## 2021-07-23 NOTE — Anesthesia Preprocedure Evaluation (Signed)
Anesthesia Evaluation  ?Patient identified by MRN, date of birth, ID band ?Patient awake ? ? ? ?Reviewed: ?Allergy & Precautions, H&P , NPO status , Patient's Chart, lab work & pertinent test results, reviewed documented beta blocker date and time  ? ?Airway ?Mallampati: II ? ?TM Distance: >3 FB ?Neck ROM: full ? ? ? Dental ?no notable dental hx. ? ?  ?Pulmonary ?COPD, former smoker,  ?  ?Pulmonary exam normal ?breath sounds clear to auscultation ? ? ? ? ? ? Cardiovascular ?Exercise Tolerance: Good ?negative cardio ROS ? ? ?Rhythm:regular Rate:Normal ? ? ?  ?Neuro/Psych ?PSYCHIATRIC DISORDERS Depression negative neurological ROS ?   ? GI/Hepatic ?Neg liver ROS, GERD  Medicated,  ?Endo/Other  ?negative endocrine ROS ? Renal/GU ?CRFRenal disease  ?negative genitourinary ?  ?Musculoskeletal ? ? Abdominal ?  ?Peds ? Hematology ? ?(+) Blood dyscrasia, anemia ,   ?Anesthesia Other Findings ? ? Reproductive/Obstetrics ?negative OB ROS ? ?  ? ? ? ? ? ? ? ? ? ? ? ? ? ?  ?  ? ? ? ? ? ? ? ? ?Anesthesia Physical ?Anesthesia Plan ? ?ASA: 3 ? ?Anesthesia Plan: General  ? ?Post-op Pain Management:   ? ?Induction:  ? ?PONV Risk Score and Plan: Propofol infusion ? ?Airway Management Planned:  ? ?Additional Equipment:  ? ?Intra-op Plan:  ? ?Post-operative Plan:  ? ?Informed Consent: I have reviewed the patients History and Physical, chart, labs and discussed the procedure including the risks, benefits and alternatives for the proposed anesthesia with the patient or authorized representative who has indicated his/her understanding and acceptance.  ? ? ? ?Dental Advisory Given ? ?Plan Discussed with: CRNA ? ?Anesthesia Plan Comments:   ? ? ? ? ? ? ?Anesthesia Quick Evaluation ? ?

## 2021-07-23 NOTE — Transfer of Care (Signed)
Immediate Anesthesia Transfer of Care Note ? ?Patient: [name] ? ?Procedure(s) Performed: ESOPHAGOGASTRODUODENOSCOPY (EGD) WITH PROPOFOL ?ENTEROSCOPY ?HEMOSTASIS CLIP PLACEMENT ? ?Patient Location: PACU ? ?Anesthesia Type:General ? ?Level of Consciousness: awake ? ?Airway & Oxygen Therapy: Patient Spontanous Breathing and Patient connected to face mask oxygen ? ?Post-op Assessment: Report given to RN and Post -op Vital signs reviewed and stable ? ?Post vital signs: Reviewed and stable ? ?Last Vitals:  ?Vitals Value Taken Time  ?BP 73/45 07/23/21 1458  ?Temp 36.5 ?C 07/23/21 1459  ?Pulse 64 07/23/21 1459  ?Resp 21 07/23/21 1459  ?SpO2 100 % 07/23/21 1459  ?Vitals shown include unvalidated device data. ? ?Last Pain:  ?Vitals:  ? 07/23/21 1432  ?TempSrc:   ?PainSc: 0-No pain  ?   ? ?Patients Stated Pain Goal: 0 (07/22/21 1423) ? ?Complications: No notable events documented. ?

## 2021-07-23 NOTE — Brief Op Note (Signed)
07/21/2021 - 07/23/2021 ? ?2:55 PM ? ?PATIENT:  [name]  88 y.o. male ? ?PRE-OPERATIVE DIAGNOSIS:  melena ? ?POST-OPERATIVE DIAGNOSIS:  Clot @ the GE junction; Gastritis; Duodenitis;  ? ?PROCEDURE:  Procedure(s): ?ESOPHAGOGASTRODUODENOSCOPY (EGD) WITH PROPOFOL (N/A) ?ENTEROSCOPY ?HEMOSTASIS CLIP PLACEMENT ? ?SURGEON:  Surgeon(s) and Role: ?   Harvel Quale, MD - Primary ? ?Patient underwent EGD under propofol sedation.  Tolerated the procedure adequately.  Clotted blood was found at the gastroesophageal junction, which was firmly adhered to the esophagus. Patchy mild inflammation characterized by erythema was found in the gastric body. Localized inflammation, characterized by congestion (edema) and erythema was found in the first portion of the duodenum. The exam of the duodenum was otherwise normal. Localized hemorrhagic mucosa with active bleeding and with stigmata of bleeding was found in the proximal jejunum. Upon lavage it looked as a vascularized area, ?possible Dieulafoy vs AVM. No clear ulceration or vessel was observed. To stop active bleeding, one hemostatic clip was successfully placed.  There was no bleeding at the end of the procedure.  ? ?Note: vascular lesion could be a small Dieulafoy vs AVM exacerbated GI bleeding from azaticidine. ? ?RECOMMENDATIONS ?- Return patient to hospital ward for ongoing care.  ?- Clear liquid diet today.  ?- Use Protonix (pantoprazole) 40 mg PO BID.  ?- H/H every day. ? ?Maylon Peppers, MD ?Gastroenterology and Hepatology ?[facility] ? ?

## 2021-07-23 NOTE — Assessment & Plan Note (Signed)
Baseline creatinine 1.5-1.8 ?Serial BMPs ?

## 2021-07-23 NOTE — Anesthesia Postprocedure Evaluation (Signed)
Anesthesia Post Note ? ?Patient: [name] ? ?Procedure(s) Performed: ESOPHAGOGASTRODUODENOSCOPY (EGD) WITH PROPOFOL ?ENTEROSCOPY ?HEMOSTASIS CLIP PLACEMENT ? ?Patient location during evaluation: Phase II ?Anesthesia Type: General ?Level of consciousness: awake ?Pain management: pain level controlled ?Vital Signs Assessment: post-procedure vital signs reviewed and stable ?Respiratory status: spontaneous breathing and respiratory function stable ?Cardiovascular status: blood pressure returned to baseline and stable ?Postop Assessment: no headache and no apparent nausea or vomiting ?Anesthetic complications: no ?Comments: Late entry ? ? ?No notable events documented. ? ? ?Last Vitals:  ?Vitals:  ? 07/23/21 1503 07/23/21 1509  ?BP: (!) 90/54 (!) 90/54  ?Pulse: 61 61  ?Resp: 20 18  ?Temp:    ?SpO2: 100% 100%  ?  ?Last Pain:  ?Vitals:  ? 07/23/21 1509  ?TempSrc:   ?PainSc: 0-No pain  ? ? ?  ?  ?  ?  ?  ?  ? ?Louann Sjogren ? ? ? ? ?

## 2021-07-24 ENCOUNTER — Ambulatory Visit (HOSPITAL_COMMUNITY): Payer: Medicare Other

## 2021-07-24 DIAGNOSIS — K2901 Acute gastritis with bleeding: Secondary | ICD-10-CM | POA: Diagnosis not present

## 2021-07-24 DIAGNOSIS — D469 Myelodysplastic syndrome, unspecified: Secondary | ICD-10-CM

## 2021-07-24 DIAGNOSIS — D696 Thrombocytopenia, unspecified: Secondary | ICD-10-CM | POA: Diagnosis not present

## 2021-07-24 DIAGNOSIS — D62 Acute posthemorrhagic anemia: Secondary | ICD-10-CM | POA: Diagnosis not present

## 2021-07-24 DIAGNOSIS — J9621 Acute and chronic respiratory failure with hypoxia: Secondary | ICD-10-CM | POA: Diagnosis not present

## 2021-07-24 LAB — BASIC METABOLIC PANEL
Anion gap: 5 (ref 5–15)
BUN: 40 mg/dL — ABNORMAL HIGH (ref 8–23)
CO2: 22 mmol/L (ref 22–32)
Calcium: 8.3 mg/dL — ABNORMAL LOW (ref 8.9–10.3)
Chloride: 110 mmol/L (ref 98–111)
Creatinine, Ser: 1.61 mg/dL — ABNORMAL HIGH (ref 0.61–1.24)
GFR, Estimated: 41 mL/min — ABNORMAL LOW (ref >60)
Glucose, Bld: 85 mg/dL (ref 70–99)
Potassium: 3.4 mmol/L — ABNORMAL LOW (ref 3.5–5.1)
Sodium: 137 mmol/L (ref 135–145)

## 2021-07-24 LAB — CBC
HCT: 26.8 % — ABNORMAL LOW (ref 39.0–52.0)
Hemoglobin: 8.5 g/dL — ABNORMAL LOW (ref 13.0–17.0)
MCH: 27.5 pg (ref 26.0–34.0)
MCHC: 31.7 g/dL (ref 30.0–36.0)
MCV: 86.7 fL (ref 80.0–100.0)
Platelets: 49 10*3/uL — ABNORMAL LOW (ref 150–400)
RBC: 3.09 MIL/uL — ABNORMAL LOW (ref 4.22–5.81)
RDW: 16.4 % — ABNORMAL HIGH (ref 11.5–15.5)
WBC: 5.8 10*3/uL (ref 4.0–10.5)
nRBC: 0.7 % — ABNORMAL HIGH (ref 0.0–0.2)

## 2021-07-24 MED ORDER — POTASSIUM CHLORIDE CRYS ER 20 MEQ PO TBCR
20.0000 meq | EXTENDED_RELEASE_TABLET | Freq: Once | ORAL | Status: AC
  Administered 2021-07-24: 19:00:00 20 meq via ORAL
  Filled 2021-07-24: qty 1

## 2021-07-24 NOTE — Progress Notes (Signed)
PROGRESS NOTE  [name] WPY:[id number] DOB: 1929-06-02 DOA: 07/21/2021 PCP: Sharilyn Sites, MD  Brief History:   [name] is a 88 y.o. male with medical history significant of with history of bone cancer currently getting chemo, CKD, COPD, GERD presents to ED with a chief complaint of dizziness, weakness, dyspnea.     Patient reports that he was post to go to the cancer center 07/21/21, but he was feeling dizzy and seeing double.  He sat down to see if rest would make it go away but it did not.  He progressively became weaker throughout the day.  He can even ambulate to make himself something to eat.  He reports exertional dyspnea as well.  He tried his inhalers and they did not help.  He denies any chest pain or abdominal pain.  He denies any hematochezia.  He does report that the first vomiting he had when he got home from his last hospitalization was black and bloody.  Since then he has had at least 2 melanotic stools per day.  He reports that he thought that that was just irritation from the previous scopes on last admission.  Patient was just discharged from the hospital on March 1.  At that time he presented with symptomatic anemia as well.  He has had numerous transfusions with packed red blood cells every 2-4 weeks.  He received a total of 5 units in the last admission.  s/p EGD 2/28 with clotted blood and GE junction biopsied, injected, clip placed, sloughing of mucosa in the esophagus, gastritis.  Surgical pathology showed presence of necroinflammatory debris but no other abnormalities.     Today's presentation is very similar to the last presentation. Patient reports he does not smoke, does not drink alcohol, does not use illicit drugs.   He is vaccinated for COVID.   Patient is partial code.  He does not want CPR.  In the ED Temp 97.7, heart rate 91-97, respiratory rate 14-18, blood pressure 84/59-108/48 Initially satting 87%, improved to 94% on 4 L nasal  cannula CBC shows a hemoglobin of 5.4 potassium of 3.4 BUN of 66 and a creatinine of 1.9 Albumin is 2.5 Initial lactic acid is 2.3, repeat 1.4 BC smear showed polychromasia, schistocytes, teardrop cells, left shift FOBT positive Blood cultures are pending as there was initially a code sepsis with the hypotension Chest x-ray stable without acute cardiopulmonary disease  EKG shows a heart rate of 87, sinus rhythm, QTc 446    Assessment and Plan: * Acute on chronic blood loss anemia - Hypotensive, generalized weakness due to symptomatic anemia Patient reports melena since his last discharge Presented with Hgb 5.4 Received 4 units PRBC, 2 units platelets this admission 3/8 EGD--Localized hemorrhagic mucosa with active bleeding and with stigmata of bleeding was found in the proximal jejunum. Upon lavage it looked as a vascularized area, ?possible Dieulafoy vs AVM.  Hemoclip placed   Acute on chronic respiratory failure with hypoxia [facility]) Patient wears 3 L nasal cannula baseline Presents to the ED with hypoxic with a O2 sat of 87% Normalized on 4 L nasal cannula Secondary to anemia Now back to 3L Continue to monitor  Myelodysplastic syndrome [facility]) Follows Dr. Delton Coombes 07/09/21 follow up with Dr. Delton Coombes states treatment of MDS on hold due to hospitalizations for syncope Previously on Azacitidine 09/16/20 to 04/21/21 3/8--discussed with Dr. Ernst Bowler plans to restart azacitidine  Thrombocytopenia [facility]) Has been chronic due to  MDS,  30-40K range -received 2 units platelets this admission Continue serial CBC  Hypokalemia Replace and rechecking today   Protein-calorie malnutrition, moderate (HCC) Body mass index is 17.04 kg/m. - Albumin 2.5 -Dietary consult Encourage nutrient dense food choices when patient is able to take p.o. Considering  adding Ensure shakes when patient is able to take p.o.  Heme positive stool GI consulted EGD on 3/8 as discussed above  CKD  (chronic kidney disease), stage IIIB Baseline creatinine 1.5-1.8 Serial BMPs  COPD (chronic obstructive pulmonary disease) (HCC) Baseline 3 L oxygen requirement Continue albuterol as needed Stable    Hypotensive episode-resolved as of 07/23/2021 - Likely due to severe anemia, hypovolemia -Continue with 2U PRBC blood transfusion, IV fluids -We will initiate midodrine if needed -Currently BP improving         Status is: Inpatient Remains inpatient appropriate because: severity of illness requiring multiple blood transfusions and GI procedure  Status is: Inpatient Remains inpatient appropriate because: hemodynamic instability and severity of illness       Family Communication:   daughter updated 3/8   Consultants:  GI, palliative   Code Status:  Partial DNR--no CPR, yes all other modalities   DVT Prophylaxis:  SCDs     Procedures: As Listed in Progress Note Above   Antibiotics: None       Subjective: Patient denies fevers, chills, headache, chest pain, dyspnea, nausea, vomiting, diarrhea, abdominal pain, dysuria, hematuria, hematochezia, and melena.   Objective: Vitals:   07/24/21 1200 07/24/21 1300 07/24/21 1400 07/24/21 1500  BP: 116/63 (!) 91/41 (!) 105/53 (!) 98/48  Pulse: 71 (!) 58  61  Resp: (!) '21 12 18 13  '$ Temp:      TempSrc:      SpO2: 100%   100%  Weight:      Height:        Intake/Output Summary (Last 24 hours) at 07/24/2021 1728 Last data filed at 07/24/2021 1500 Gross per 24 hour  Intake 572.81 ml  Output 925 ml  Net -352.19 ml   Weight change: 14.7 kg Exam:  General:  Pt is alert, follows commands appropriately, not in acute distress HEENT: No icterus, No thrush, No neck mass, [place]/AT Cardiovascular: RRR, S1/S2, no rubs, no gallops Respiratory: diminished BS.  Bibasilar rales. No wheeze Abdomen: Soft/+BS, non tender, non distended, no guarding Extremities: No edema, No lymphangitis, No petechiae, No rashes, no synovitis   Data  Reviewed: I have personally reviewed following labs and imaging studies Basic Metabolic Panel: Recent Labs  Lab 07/21/21 2255 07/22/21 1204 07/24/21 0416  NA 137 139 137  K 3.4* 3.9 3.4*  CL 108 116* 110  CO2 22 19* 22  GLUCOSE 113* 92 85  BUN 66* 60* 40*  CREATININE 1.89* 1.57* 1.61*  CALCIUM 8.7* 8.2* 8.3*  MG  --  1.8  --    Liver Function Tests: Recent Labs  Lab 07/21/21 2255 07/22/21 1204  AST 18 17  ALT 15 14  ALKPHOS 50 41  BILITOT 0.5 0.5  PROT 5.8* 4.6*  ALBUMIN 2.5* 2.1*   No results for input(s): LIPASE, AMYLASE in the last 168 hours. No results for input(s): AMMONIA in the last 168 hours. Coagulation Profile: Recent Labs  Lab 07/21/21 2255  INR 1.1   CBC: Recent Labs  Lab 07/21/21 2255 07/22/21 1204 07/22/21 1755 07/23/21 0031 07/24/21 0416  WBC 11.0* 6.4 10.9* 7.4 5.8  NEUTROABS 7.2  --   --   --   --  HGB 5.4* 7.1* 7.2* 9.2* 8.5*  HCT 17.4* 21.8* 22.6* 27.5* 26.8*  MCV 86.6 87.6 87.3 84.9 86.7  PLT 45* 29* 64* 61* 49*   Cardiac Enzymes: No results for input(s): CKTOTAL, CKMB, CKMBINDEX, TROPONINI in the last 168 hours. BNP: Invalid input(s): POCBNP CBG: No results for input(s): GLUCAP in the last 168 hours. HbA1C: No results for input(s): HGBA1C in the last 72 hours. Urine analysis:    Component Value Date/Time   COLORURINE STRAW (A) 07/22/2021 0515   APPEARANCEUR CLEAR 07/22/2021 0515   APPEARANCEUR Hazy (A) 03/26/2021 1334   LABSPEC 1.012 07/22/2021 0515   PHURINE 5.0 07/22/2021 0515   GLUCOSEU NEGATIVE 07/22/2021 0515   HGBUR MODERATE (A) 07/22/2021 0515   BILIRUBINUR NEGATIVE 07/22/2021 0515   BILIRUBINUR Negative 03/26/2021 1334   KETONESUR NEGATIVE 07/22/2021 0515   PROTEINUR NEGATIVE 07/22/2021 0515   UROBILINOGEN 0.2 08/15/2013 1240   NITRITE NEGATIVE 07/22/2021 0515   LEUKOCYTESUR NEGATIVE 07/22/2021 0515   Sepsis Labs: '@LABRCNTIP'$ (procalcitonin:4,lacticidven:4) ) Recent Results (from the past 240 hour(s))  Resp  Panel by RT-PCR (Flu A&B, Covid) Nasopharyngeal Swab     Status: None   Collection Time: 07/21/21 10:55 PM   Specimen: Nasopharyngeal Swab; Nasopharyngeal(NP) swabs in vial transport medium  Result Value Ref Range Status   SARS Coronavirus 2 by RT PCR NEGATIVE NEGATIVE Final    Comment: (NOTE) SARS-CoV-2 target nucleic acids are NOT DETECTED.  The SARS-CoV-2 RNA is generally detectable in upper respiratory specimens during the acute phase of infection. The lowest concentration of SARS-CoV-2 viral copies this assay can detect is 138 copies/mL. A negative result does not preclude SARS-Cov-2 infection and should not be used as the sole basis for treatment or other patient management decisions. A negative result may occur with  improper specimen collection/handling, submission of specimen other than nasopharyngeal swab, presence of viral mutation(s) within the areas targeted by this assay, and inadequate number of viral copies(<138 copies/mL). A negative result must be combined with clinical observations, patient history, and epidemiological information. The expected result is Negative.  Fact Sheet for Patients:  EntrepreneurPulse.com.au  Fact Sheet for Healthcare Providers:  IncredibleEmployment.be  This test is no t yet approved or cleared by the [place] FDA and  has been authorized for detection and/or diagnosis of SARS-CoV-2 by FDA under an Emergency Use Authorization (EUA). This EUA will remain  in effect (meaning this test can be used) for the duration of the COVID-19 declaration under Section 564(b)(1) of the Act, 21 U.S.C.section 360bbb-3(b)(1), unless the authorization is terminated  or revoked sooner.       Influenza A by PCR NEGATIVE NEGATIVE Final   Influenza B by PCR NEGATIVE NEGATIVE Final    Comment: (NOTE) The Xpert Xpress SARS-CoV-2/FLU/RSV plus assay is intended as an aid in the diagnosis of influenza from Nasopharyngeal  swab specimens and should not be used as a sole basis for treatment. Nasal washings and aspirates are unacceptable for Xpert Xpress SARS-CoV-2/FLU/RSV testing.  Fact Sheet for Patients: EntrepreneurPulse.com.au  Fact Sheet for Healthcare Providers: IncredibleEmployment.be  This test is not yet approved or cleared by the [place] FDA and has been authorized for detection and/or diagnosis of SARS-CoV-2 by FDA under an Emergency Use Authorization (EUA). This EUA will remain in effect (meaning this test can be used) for the duration of the COVID-19 declaration under Section 564(b)(1) of the Act, 21 U.S.C. section 360bbb-3(b)(1), unless the authorization is terminated or revoked.  Performed at [facility], [address] [place], [place]  [address]   Blood Culture (routine x 2)     Status: None (Preliminary result)   Collection Time: 07/21/21 10:55 PM   Specimen: Porta Cath; Blood  Result Value Ref Range Status   Specimen Description PORTA CATH  Final   Special Requests   Final    BOTTLES DRAWN AEROBIC AND ANAEROBIC Blood Culture results may not be optimal due to an inadequate volume of blood received in culture bottles   Culture   Final    NO GROWTH 3 DAYS Performed at [facility], [address], [place], [place] [address]    Report Status PENDING  Incomplete  Blood Culture (routine x 2)     Status: None (Preliminary result)   Collection Time: 07/21/21 11:16 PM   Specimen: BLOOD LEFT FOREARM  Result Value Ref Range Status   Specimen Description BLOOD LEFT FOREARM  Final   Special Requests   Final    BOTTLES DRAWN AEROBIC ONLY Blood Culture adequate volume   Culture   Final    NO GROWTH 3 DAYS Performed at [facility], [address], [place], [place] [address]    Report Status PENDING  Incomplete  MRSA Next Gen by PCR, Nasal     Status: None   Collection Time: 07/22/21  2:47 PM   Specimen: Nasal Mucosa; Nasal Swab  Result Value Ref  Range Status   MRSA by PCR Next Gen NOT DETECTED NOT DETECTED Final    Comment: (NOTE) The GeneXpert MRSA Assay (FDA approved for NASAL specimens only), is one component of a comprehensive MRSA colonization surveillance program. It is not intended to diagnose MRSA infection nor to guide or monitor treatment for MRSA infections. Test performance is not FDA approved in patients less than 3 years old. Performed at [facility], [address], [place], [place] [address]      Scheduled Meds:  sodium chloride   Intravenous Once   Chlorhexidine Gluconate Cloth  6 each Topical Daily   mouth rinse  15 mL Mouth Rinse BID   midodrine  2.5 mg Oral TID WC   [START ON 07/25/2021] pantoprazole  40 mg Intravenous Q12H   [START ON 07/26/2021] pantoprazole  40 mg Intravenous Q12H   Continuous Infusions:  sodium chloride     pantoprazole 8 mg/hr (07/24/21 1304)    Procedures/Studies: DG Chest Port 1 View  Result Date: 07/21/2021 CLINICAL DATA:  Shortness of breath. EXAM: PORTABLE CHEST 1 VIEW COMPARISON:  July 12, 2021 FINDINGS: There is stable left-sided venous Port-A-Cath positioning. The lungs are hyperinflated. There is marked severity emphysematous lung disease with stable chronic, diffusely increased interstitial lung markings. Mild, stable atelectasis is seen within the bilateral lung bases. There is no evidence of a pleural effusion or pneumothorax. The heart size and mediastinal contours are within normal limits. Prior vertebroplasty is seen within the mid to lower thoracic spine and upper lumbar spine. IMPRESSION: Stable exam, without acute cardiopulmonary disease. Electronically Signed   By: Virgina Norfolk M.D.   On: 07/21/2021 23:21   DG Chest Port 1 View  Result Date: 07/12/2021 CLINICAL DATA:  Severe shortness of breath and weakness for 1 day. Near syncopal episode. COPD. EXAM: PORTABLE CHEST 1 VIEW COMPARISON:  06/16/2021 FINDINGS: Left-sided power port remains in appropriate  position. Heart size is stable. Marked pulmonary hyperinflation is again demonstrated. Coarse diffuse interstitial infiltrates are unchanged. No evidence of superimposed pulmonary airspace disease or pleural effusion. Lower thoracic spine vertebroplasties again noted. IMPRESSION: Stable COPD and chronic interstitial lung disease.  No acute findings. Electronically Signed   By: Marlaine Hind M.D.   On: 07/12/2021 14:16    Orson Eva, DO  [facility]  If 7PM-7AM, please contact night-coverage [email or web address] Password [email or web address] 07/24/2021, 5:28 PM   LOS: 2 days

## 2021-07-24 NOTE — Plan of Care (Signed)

## 2021-07-24 NOTE — Progress Notes (Signed)
[name] ?1929/12/14 ?[id number]  ? ? ?Subjective:   ? ?Feels hungry. Hates liquid diet. No BMs. No abdominal pain. ? ?Objective:  ? ?Vital signs in last 24 hours: ?Temp:  [97.2 ?F (36.2 ?C)-97.9 ?F (36.6 ?C)] 97.6 ?F (36.4 ?C) (03/09 0745) ?Pulse Rate:  [57-75] 62 (03/09 1000) ?Resp:  [11-26] 11 (03/09 1000) ?BP: (82-114)/(33-65) 82/38 (03/09 1000) ?SpO2:  [76 %-100 %] 100 % (03/09 1000) ?Weight:  [67.7 kg] 67.7 kg (03/09 0439) ?Last BM Date : 07/21/21 ?General:   Alert,  Well-developed, well-nourished, pleasant and cooperative in NAD ?Head:  Normocephalic and atraumatic. ?Eyes:  Sclera clear, no icterus.  ?Abdomen:  Soft, nontender and nondistended. Normal bowel sounds, without guarding, and without rebound.   ?Extremities:  Without clubbing, deformity or edema. ?Neurologic:  Alert and  oriented x4;  grossly normal neurologically. ?Skin:  Intact without significant lesions or rashes. ?Psych:  Alert and cooperative. Normal mood and affect. ? ?Intake/Output from previous day: ?03/08 0701 - 03/09 0700 ?In: 583.3 [P.O.:100; I.V.:483.3] ?Out: 1175 [Urine:1175] ?Intake/Output this shift: ?No intake/output data recorded. ? ?Lab Results: ?CBC ?Recent Labs  ?  07/22/21 ?5102 07/23/21 ?0031 07/24/21 ?0416  ?WBC 10.9* 7.4 5.8  ?HGB 7.2* 9.2* 8.5*  ?HCT 22.6* 27.5* 26.8*  ?MCV 87.3 84.9 86.7  ?PLT 64* 61* 49*  ? ?BMET ?Recent Labs  ?  07/21/21 ?2255 07/22/21 ?1204 07/24/21 ?0416  ?NA 137 139 137  ?K 3.4* 3.9 3.4*  ?CL 108 116* 110  ?CO2 22 19* 22  ?GLUCOSE 113* 92 85  ?BUN 66* 60* 40*  ?CREATININE 1.89* 1.57* 1.61*  ?CALCIUM 8.7* 8.2* 8.3*  ? ?LFTs ?Recent Labs  ?  07/21/21 ?2255 07/22/21 ?1204  ?BILITOT 0.5 0.5  ?ALKPHOS 50 41  ?AST 18 17  ?ALT 15 14  ?PROT 5.8* 4.6*  ?ALBUMIN 2.5* 2.1*  ? ?No results for input(s): LIPASE in the last 72 hours. ?PT/INR ?Recent Labs  ?  07/21/21 ?2255  ?LABPROT 14.4  ?INR 1.1  ? ?    ? ?Imaging Studies: ?DG Chest Port 1 View ? ?Result Date: 07/21/2021 ?CLINICAL DATA:  Shortness of breath.  EXAM: PORTABLE CHEST 1 VIEW COMPARISON:  July 12, 2021 FINDINGS: There is stable left-sided venous Port-A-Cath positioning. The lungs are hyperinflated. There is marked severity emphysematous lung disease with stable chronic, diffusely increased interstitial lung markings. Mild, stable atelectasis is seen within the bilateral lung bases. There is no evidence of a pleural effusion or pneumothorax. The heart size and mediastinal contours are within normal limits. Prior vertebroplasty is seen within the mid to lower thoracic spine and upper lumbar spine. IMPRESSION: Stable exam, without acute cardiopulmonary disease. Electronically Signed   By: Virgina Norfolk M.D.   On: 07/21/2021 23:21  ? ?DG Chest Port 1 View ? ?Result Date: 07/12/2021 ?CLINICAL DATA:  Severe shortness of breath and weakness for 1 day. Near syncopal episode. COPD. EXAM: PORTABLE CHEST 1 VIEW COMPARISON:  06/16/2021 FINDINGS: Left-sided power port remains in appropriate position. Heart size is stable. Marked pulmonary hyperinflation is again demonstrated. Coarse diffuse interstitial infiltrates are unchanged. No evidence of superimposed pulmonary airspace disease or pleural effusion. Lower thoracic spine vertebroplasties again noted. IMPRESSION: Stable COPD and chronic interstitial lung disease. No acute findings. Electronically Signed   By: Marlaine Hind M.D.   On: 07/12/2021 14:16  [2 weeks] ? ?Assessment:  ? ?88 year old male with history of MDS on azacitidine requiring intermittent blood transfusions per oncology, COPD on 3 L of O2 via nasal cannula  as an outpatient, chronic kidney disease, recent admission with profound symptomatic anemia with hemoglobin of 5.3, status post EGD February 28 with clotted blood and GE junction biopsy, injected, clips placed, sloughing of mucosa in the esophagus, gastritis.  Surgical pathology revealed necroinflammatory debris, detached reactive squamous cells, granular cells and bacterial clumps without overt  malignancy.  Received 5 units of packed red blood cells with discharge hemoglobin of 9.5.  Return to the ED on March 6 with symptomatic anemia with hemoglobin of 5.4. ? ?Acute on chronic symptomatic anemia with melena: Recent EGD as outlined above.  Was started on PPI twice daily which she has continued as an outpatient.  Returns now with hemoglobin of 5.4 reporting 1-2 melanotic stool since discharge.  Denies NSAIDs.  He has received a total of 4 units of packed red blood cells on March 7.  Received 2 units of platelets on March 7.  After transfusion his hemoglobin was up to 9.2 yesterday, 8.5 today.  Platelets dropped to a low of 29,000 up to 64,000 with transfusion, down to 49,000 today. BPs remain soft.  Systolic ranging from 16-109, diastolic ranging from 60-45.  Pulse from 57-75.  Remains on midodrine. ? ?Small bowel enteroscopy this admission, clotted blood firmly adhered to distal esophagus at the GE junction (similar to 07/15/21), gastritis, duodenitis.  Localized hemorrhagic mucosa with active bleeding found in the proximal jejunum, upon lavage it looked as a vascularized area, possible Dieulafoy, hemostatic clip placed. ? ?Plan:  ? ?Consider transitioning drip to pantoprazole 40 mg twice daily. ?Trend H&H daily. ?Advance diet.  ?May still require repeat EGD as planned in 1-2 months to look at [facility] again.  ? ? LOS: 2 days  ? ?Laureen Ochs. Bobby Rumpf, PA-C ?[facility] ?[phone] ?3/9/202311:14 AM ?  ? ?

## 2021-07-24 NOTE — Consult Note (Signed)
[facility] Consultation Oncology  Name: [name]      MRN: [id number]    Location: IC05/IC05-01  Date: 07/24/2021 Time:4:44 PM   REFERRING PHYSICIAN: Drs. Shahmehdi/Tat  REASON FOR CONSULT: Acute severe anemia   DIAGNOSIS: MDS and GI bleed  HISTORY OF PRESENT ILLNESS: [name] is a 88 year old very pleasant white male who is well-known to me from office visits for his high risk MDS.  He had recent admission with profound anemia with hemoglobin 5.3, status post EGD on 07/15/2021 with clotted blood in the GE junction, sloughing of mucosa in the esophagus, gastritis.  He received 5 units PRBC and was discharged home with hemoglobin of 9.5.  He came back to the ER on 07/21/2021 with symptomatic anemia with hemoglobin 5.4.  Small bowel enteroscopy this admission showed clotted blood from the adherent to the distal esophagus at the GE junction similar to 07/15/2021 EGD.  It also showed gastritis, duodenitis.  Localized hemorrhagic mucosa with active bleeding found in the proximal jejunum, after lavage it looked as a vascularized area, possible Dieulafoy, hemostatic clip placed.  PAST MEDICAL HISTORY:   Past Medical History:  Diagnosis Date   Bone cancer ([place])    Chronic kidney insufficiency    COPD (chronic obstructive pulmonary disease) (HCC)    GERD (gastroesophageal reflux disease)    Pulmonary nodule     ALLERGIES: No Known Allergies    MEDICATIONS: I have reviewed the patient's current medications.     PAST SURGICAL HISTORY Past Surgical History:  Procedure Laterality Date   BACK SURGERY     BIOPSY  07/15/2021   Procedure: BIOPSY;  Surgeon: Harvel Quale, MD;  [facility];  Service: Gastroenterology;;   CATARACT EXTRACTION W/PHACO Right 03/09/2016   Procedure: CATARACT EXTRACTION PHACO AND INTRAOCULAR LENS PLACEMENT RIGHT EYE CDE=8.58;  Surgeon: Tonny Branch, MD;  Location: [facility];  Service: Ophthalmology;  Laterality: Right;  right   CATARACT  EXTRACTION W/PHACO Left 04/13/2016   Procedure: CATARACT EXTRACTION PHACO AND INTRAOCULAR LENS PLACEMENT (IOC);  Surgeon: Tonny Branch, MD;  Location: [facility];  Service: Ophthalmology;  Laterality: Left;  CDE: 8.08   ESOPHAGOGASTRODUODENOSCOPY (EGD) WITH PROPOFOL N/A 07/14/2021   Procedure: ESOPHAGOGASTRODUODENOSCOPY (EGD) WITH PROPOFOL;  Surgeon: Eloise Harman, DO;  [facility];  Service: Endoscopy;  Laterality: N/A;   ESOPHAGOGASTRODUODENOSCOPY (EGD) WITH PROPOFOL N/A 07/15/2021   Procedure: ESOPHAGOGASTRODUODENOSCOPY (EGD) WITH PROPOFOL;  Surgeon: Harvel Quale, MD;  Location[facility];  Service: Gastroenterology;  Laterality: N/A;   HEMOSTASIS CLIP PLACEMENT  07/15/2021   Procedure: HEMOSTASIS CLIP PLACEMENT;  Surgeon: Harvel Quale, MD;  Location: [facility];  Service: Gastroenterology;;   KYPHOPLASTY Bilateral 06/09/2019   Procedure: T11 KYPHOPLASTY;  Surgeon: Consuella Lose, MD;  Location: [facility];  Service: Neurosurgery;  Laterality: Bilateral;   None to Date  10/01/15   PORTACATH PLACEMENT Left 10/11/2020   Procedure: INSERTION PORT-A-CATH (attached catheter in left internal jugular);  Surgeon: Virl Cagey, MD;  Location: [facility];  Service: General;  Laterality: Left;    FAMILY HISTORY: Family History  Problem Relation Age of Onset   Kidney disease Mother    Kidney disease Sister    Colon cancer Neg Hx    Gastric cancer Neg Hx    Esophageal cancer Neg Hx     SOCIAL HISTORY:  reports that he quit smoking about 30 years ago. His smoking use included cigarettes and cigars. He has never used smokeless tobacco. He reports  that he does not currently use alcohol. He reports that he does not use drugs.  PERFORMANCE STATUS: The patient's performance status is 2 - Symptomatic, <50% confined to bed  PHYSICAL EXAM: Most Recent Vital Signs: Blood pressure (!) 98/48, pulse 61, temperature 98.5 F (36.9 C), temperature source Oral, resp. rate  13, height 6' (1.829 m), weight 149 lb 4 oz (67.7 kg), SpO2 100 %. BP (!) 98/48    Pulse 61    Temp 98.5 F (36.9 C) (Oral)    Resp 13    Ht 6' (1.829 m)    Wt 149 lb 4 oz (67.7 kg)    SpO2 100%    BMI 20.24 kg/m  General appearance: alert, cooperative, and appears stated age Lungs: clear to auscultation bilaterally Heart: regular rate and rhythm Extremities:  No edema or cyanosis. Neurologic: Grossly normal  LABORATORY DATA:  Results for orders placed or performed during the hospital encounter of 07/21/21 (from the past 48 hour(s))  CBC     Status: Abnormal   Collection Time: 07/22/21  5:55 PM  Result Value Ref Range   WBC 10.9 (H) 4.0 - 10.5 K/uL   RBC 2.59 (L) 4.22 - 5.81 MIL/uL   Hemoglobin 7.2 (L) 13.0 - 17.0 g/dL   HCT 22.6 (L) 39.0 - 52.0 %   MCV 87.3 80.0 - 100.0 fL   MCH 27.8 26.0 - 34.0 pg   MCHC 31.9 30.0 - 36.0 g/dL   RDW 17.0 (H) 11.5 - 15.5 %   Platelets 64 (L) 150 - 400 K/uL    Comment: SPECIMEN CHECKED FOR CLOTS Immature Platelet Fraction may be clinically indicated, consider ordering this additional test [id number] CONSISTENT WITH PREVIOUS RESULT    nRBC 0.8 (H) 0.0 - 0.2 %    Comment: Performed at [facility], [address], [place], [place] [address]  CBC     Status: Abnormal   Collection Time: 07/23/21 12:31 AM  Result Value Ref Range   WBC 7.4 4.0 - 10.5 K/uL   RBC 3.24 (L) 4.22 - 5.81 MIL/uL   Hemoglobin 9.2 (L) 13.0 - 17.0 g/dL    Comment: POST TRANSFUSION SPECIMEN   HCT 27.5 (L) 39.0 - 52.0 %   MCV 84.9 80.0 - 100.0 fL   MCH 28.4 26.0 - 34.0 pg   MCHC 33.5 30.0 - 36.0 g/dL   RDW 15.9 (H) 11.5 - 15.5 %   Platelets 61 (L) 150 - 400 K/uL    Comment: SPECIMEN CHECKED FOR CLOTS Immature Platelet Fraction may be clinically indicated, consider ordering this additional test [id number] CONSISTENT WITH PREVIOUS RESULT    nRBC 0.8 (H) 0.0 - 0.2 %    Comment: Performed at [facility], [address] [place], [place] [address]  CBC     Status: Abnormal    Collection Time: 07/24/21  4:16 AM  Result Value Ref Range   WBC 5.8 4.0 - 10.5 K/uL   RBC 3.09 (L) 4.22 - 5.81 MIL/uL   Hemoglobin 8.5 (L) 13.0 - 17.0 g/dL   HCT 26.8 (L) 39.0 - 52.0 %   MCV 86.7 80.0 - 100.0 fL   MCH 27.5 26.0 - 34.0 pg   MCHC 31.7 30.0 - 36.0 g/dL   RDW 16.4 (H) 11.5 - 15.5 %   Platelets 49 (L) 150 - 400 K/uL    Comment: SPECIMEN CHECKED FOR CLOTS Immature Platelet Fraction may be clinically indicated, consider ordering this additional test [id number] CONSISTENT WITH PREVIOUS RESULT    nRBC 0.7 (  H) 0.0 - 0.2 %    Comment: Performed at [facility], [address] [place], [place] [address]  Basic metabolic panel     Status: Abnormal   Collection Time: 07/24/21  4:16 AM  Result Value Ref Range   Sodium 137 135 - 145 mmol/L   Potassium 3.4 (L) 3.5 - 5.1 mmol/L   Chloride 110 98 - 111 mmol/L   CO2 22 22 - 32 mmol/L   Glucose, Bld 85 70 - 99 mg/dL    Comment: Glucose reference range applies only to samples taken after fasting for at least 8 hours.   BUN 40 (H) 8 - 23 mg/dL   Creatinine, Ser 1.61 (H) 0.61 - 1.24 mg/dL   Calcium 8.3 (L) 8.9 - 10.3 mg/dL   GFR, Estimated 41 (L) >60 mL/min    Comment: (NOTE) Calculated using the CKD-EPI Creatinine Equation (2021)    Anion gap 5 5 - 15    Comment: Performed at [facility], [address], [place], [place] [address]      RADIOGRAPHY: No results found.       ASSESSMENT and PLAN:  1.  Acute on chronic anemia from MDS: - EGD on 07/15/2021: Clotted blood at the GE junction, sloughing of mucosa in the esophagus, gastritis - Admission on 07/22/2021 with hemoglobin 5.4, melanotic stools.  Status post 4 units of PRBC and 2 units of platelets on 07/22/2021. - Small bowel enteroscopy: Clotted blood firmly adhered to distal esophagus at the GE junction, gastritis, duodenitis.  Localized hemorrhagic mucosa with active bleeding found in the proximal jejunum, upon lavage it looked as a vascularized area, possible Dieulafoy  lesion, hemostatic clip placed. - His diet is being advanced.  He will be transitioning to Protonix 40 mg twice daily.  He will require a repeat EGD in 1 to 2 months. - Please transfuse 1 unit of platelets tomorrow if there is any further drop. - If he is discharged over the weekend, he will follow-up with me in the clinic on Monday or Tuesday. - Discussed with patient's daughter Stanton Kidney.  All questions were answered. The patient knows to call the clinic with any problems, questions or concerns. We can certainly see the patient much sooner if necessary.   Derek Jack

## 2021-07-25 ENCOUNTER — Telehealth (INDEPENDENT_AMBULATORY_CARE_PROVIDER_SITE_OTHER): Payer: Self-pay | Admitting: Gastroenterology

## 2021-07-25 ENCOUNTER — Ambulatory Visit (HOSPITAL_COMMUNITY): Payer: Medicare Other

## 2021-07-25 DIAGNOSIS — K2901 Acute gastritis with bleeding: Secondary | ICD-10-CM | POA: Diagnosis not present

## 2021-07-25 DIAGNOSIS — D62 Acute posthemorrhagic anemia: Secondary | ICD-10-CM | POA: Diagnosis not present

## 2021-07-25 LAB — MAGNESIUM: Magnesium: 1.7 mg/dL (ref 1.7–2.4)

## 2021-07-25 LAB — CBC
HCT: 28.2 % — ABNORMAL LOW (ref 39.0–52.0)
Hemoglobin: 9 g/dL — ABNORMAL LOW (ref 13.0–17.0)
MCH: 27.4 pg (ref 26.0–34.0)
MCHC: 31.9 g/dL (ref 30.0–36.0)
MCV: 86 fL (ref 80.0–100.0)
Platelets: 45 10*3/uL — ABNORMAL LOW (ref 150–400)
RBC: 3.28 MIL/uL — ABNORMAL LOW (ref 4.22–5.81)
RDW: 16 % — ABNORMAL HIGH (ref 11.5–15.5)
WBC: 4.9 10*3/uL (ref 4.0–10.5)
nRBC: 1.2 % — ABNORMAL HIGH (ref 0.0–0.2)

## 2021-07-25 LAB — BASIC METABOLIC PANEL
Anion gap: 4 — ABNORMAL LOW (ref 5–15)
BUN: 31 mg/dL — ABNORMAL HIGH (ref 8–23)
CO2: 23 mmol/L (ref 22–32)
Calcium: 8.5 mg/dL — ABNORMAL LOW (ref 8.9–10.3)
Chloride: 110 mmol/L (ref 98–111)
Creatinine, Ser: 1.78 mg/dL — ABNORMAL HIGH (ref 0.61–1.24)
GFR, Estimated: 36 mL/min — ABNORMAL LOW (ref >60)
Glucose, Bld: 92 mg/dL (ref 70–99)
Potassium: 3.7 mmol/L (ref 3.5–5.1)
Sodium: 137 mmol/L (ref 135–145)

## 2021-07-25 MED ORDER — MAGNESIUM SULFATE 2 GM/50ML IV SOLN
2.0000 g | Freq: Once | INTRAVENOUS | Status: AC
  Administered 2021-07-25: 2 g via INTRAVENOUS
  Filled 2021-07-25: qty 50

## 2021-07-25 MED ORDER — SODIUM CHLORIDE 0.9% IV SOLUTION: Freq: Once | INTRAVENOUS | Status: DC

## 2021-07-25 NOTE — Progress Notes (Signed)
?Subjective: ?Patient states he is feeling okay today, denies abdominal pain, nausea, vomiting. States he has not had a BM in the past few days. Is passing a lot of gas. Had some food last night that he tolerated well. Denies sob or dizziness.  ? ?Objective: ?Vital signs in last 24 hours: ?Temp:  [97.5 ?F (36.4 ?C)-98.5 ?F (36.9 ?C)] 97.7 ?F (36.5 ?C) (03/10 0700) ?Pulse Rate:  [58-83] 73 (03/10 0940) ?Resp:  [11-23] 15 (03/10 0940) ?BP: (86-117)/(38-72) 96/45 (03/10 0940) ?SpO2:  [97 %-100 %] 100 % (03/10 0940) ?Weight:  [64.5 kg] 64.5 kg (03/10 0437) ?Last BM Date : 07/21/21 ?General:   Alert and oriented, pleasant ?Head:  Normocephalic and atraumatic. ?Eyes:  No icterus, sclera clear. Conjuctiva pink.  ?Mouth:  Without lesions, mucosa pink and moist.  ?Heart:  S1, S2 present, no murmurs noted.  ?Lungs: Clear to auscultation bilaterally, without wheezing, rales, or rhonchi.  ?Abdomen:  Bowel sounds present, soft, non-tender, non-distended. No HSM or hernias noted. No rebound or guarding. No masses appreciated  ?Msk:  Symmetrical without gross deformities. Normal posture. ?Pulses:  Normal pulses noted. ?Extremities:  Without clubbing or edema. ?Neurologic:  Alert and  oriented x4;  grossly normal neurologically. ?Skin:  Warm and dry, intact without significant lesions.  ?Psych:  Alert and cooperative. Normal mood and affect. ? ?Intake/Output from previous day: ?03/09 0701 - 03/10 0700 ?In: 539.1 [P.O.:300; I.V.:239.1] ?Out: 725 [Urine:725] ?Intake/Output this shift: ?No intake/output data recorded. ? ?Lab Results: ?Recent Labs  ?  07/23/21 ?0031 07/24/21 ?2297 07/25/21 ?0405  ?WBC 7.4 5.8 4.9  ?HGB 9.2* 8.5* 9.0*  ?HCT 27.5* 26.8* 28.2*  ?PLT 61* 49* 45*  ? ?BMET ?Recent Labs  ?  07/22/21 ?1204 07/24/21 ?0416 07/25/21 ?0405  ?NA 139 137 137  ?K 3.9 3.4* 3.7  ?CL 116* 110 110  ?CO2 19* 22 23  ?GLUCOSE 92 85 92  ?BUN 60* 40* 31*  ?CREATININE 1.57* 1.61* 1.78*  ?CALCIUM 8.2* 8.3* 8.5*  ? ?LFT ?Recent Labs  ?   07/22/21 ?1204  ?PROT 4.6*  ?ALBUMIN 2.1*  ?AST 17  ?ALT 14  ?ALKPHOS 41  ?BILITOT 0.5  ? ?Assessment: ?88 year old male with history of MDS on azacitidine requiring intermittent blood transfusions per oncology, COPD on 3 L of O2 via nasal cannula as an outpatient, chronic kidney disease, recent admission with profound symptomatic anemia with hemoglobin of 5.3, status post EGD February 28 with clotted blood and GE junction biopsy, injected, clips placed, sloughing of mucosa in the esophagus, gastritis.  Surgical pathology revealed necroinflammatory debris, detached reactive squamous cells, granular cells and bacterial clumps without overt malignancy.  Received 5 units of packed red blood cells with discharge hemoglobin of 9.5.  Return to the ED on March 6 with symptomatic anemia with hemoglobin of 5.4 ? ?Acute on chronic anemia with melena: symptomatic. Recent EGD as outlined above, started on PPI BID which was continued outpatient. Returned with hgb 5.4 with 1-2 melanotic stools since d/c. No NSAIDs. 4 units PRBCs total this admission and 2 unites Platelets on 3/7. Post transfusion hgb was 9.2, remains at 9 today. plts dropped to 29k, improvement to 64k with platelet transfusion, down to 45k today. BP remains soft, 98-921 systolic range with diastolic 19-41 over past two days. HR WNL. Remains on midodrine. ? ?Small bowel enteroscopy this admission with clotted blood firmly adherent to distal esophagus at GE junction, gastritis, duodenitis. Localized hemorrhaic mucosa with acitve bleeding in proximal jejunum. Suspected Dieulafoy or AVM given vacularized  appearance, hemostatic clip placed.  ? ?He is tolerating diet, has no abdominal pain. No BM over the past few days but is passing gas.  ? ?Plan: ?PPI BID ?Trend H&H daily ?Advance diet as tolerated ?Possible Repeat EGD in 1-2 months for re evaluation of GEJ ?GI will likely sign off today, follow up in GI clinic in 2-3 weeks ?Repeat CBC 1 week after d/c ? ? LOS: 3  days  ? ? 07/25/2021, 10:10 AM ? ? ?Thomas Luster L. Alver Sorrow, MSN, APRN, AGNP-C ?Adult-Gerontology Nurse Practitioner ?[facility] ? ?

## 2021-07-25 NOTE — Discharge Planning (Addendum)
Oncology Discharge Planning Note ? ?[place] [facility] ?Address: [address] [place], [place] [address] ?Hours of Operation:  8am - 5pm, Monday - Friday  ?Clinic Contact Information:  [phone] ? ?Oncology Care Team: ?Medical Oncologist:  Dr. Derek Jack ? ?Patient Details: ?Name:  [name], [name] ?MRN:   [id number] ?DOB:   February 02, 1930 ?Reason for Current Admission: Acute on chronic blood loss anemia ? ?Discharge Planning Narrative: ?Discharge follow-up appointments for oncology are current and available on the AVS and MyChart.   ?Upon discharge from the hospital, hematology/oncology's post discharge plan of care for the outpatient setting is: Possible blood products, dietitian and ov with Dr. Delton Coombes on 07/28/21. ? ?[name] will be called within two business days after discharge to review hematology/oncology's plan of care for full understanding.   ? ?Outpatient Oncology Specific Care Only: ?Oncology appointment transportation needs addressed?:  not applicable ?Oncology medication management for symptom management addressed?:  not applicable ?Chemo Alert Card reviewed?:  not applicable ?Immunotherapy Alert Card reviewed?:  not applicable ? ?

## 2021-07-25 NOTE — Progress Notes (Signed)
PROGRESS NOTE  [name] LEX:[id number] DOB: 01/01/1930 DOA: 07/21/2021 PCP: Sharilyn Sites, MD  Brief History:   [name] is a 88 y.o. male with medical history significant of with history of bone cancer currently getting chemo, CKD, COPD, GERD presents to ED with a chief complaint of dizziness, weakness, dyspnea.     Patient reports that he was post to go to the cancer center 07/21/21, but he was feeling dizzy and seeing double.  He sat down to see if rest would make it go away but it did not.  He progressively became weaker throughout the day.  He can even ambulate to make himself something to eat.  He reports exertional dyspnea as well.  He tried his inhalers and they did not help.  He denies any chest pain or abdominal pain.  He denies any hematochezia.  He does report that the first vomiting he had when he got home from his last hospitalization was black and bloody.  Since then he has had at least 2 melanotic stools per day.  He reports that he thought that that was just irritation from the previous scopes on last admission.  Patient was just discharged from the hospital on March 1.  At that time he presented with symptomatic anemia as well.  He has had numerous transfusions with packed red blood cells every 2-4 weeks.  He received a total of 5 units in the last admission.  s/p EGD 2/28 with clotted blood and GE junction biopsied, injected, clip placed, sloughing of mucosa in the esophagus, gastritis.  Surgical pathology showed presence of necroinflammatory debris but no other abnormalities.     Today's presentation is very similar to the last presentation. Patient reports he does not smoke, does not drink alcohol, does not use illicit drugs.   He is vaccinated for COVID.   Patient is partial code.  He does not want CPR.  In the ED Temp 97.7, heart rate 91-97, respiratory rate 14-18, blood pressure 84/59-108/48 Initially satting 87%, improved to 94% on 4 L nasal  cannula CBC shows a hemoglobin of 5.4 potassium of 3.4 BUN of 66 and a creatinine of 1.9 Albumin is 2.5 Initial lactic acid is 2.3, repeat 1.4 BC smear showed polychromasia, schistocytes, teardrop cells, left shift FOBT positive Blood cultures are pending as there was initially a code sepsis with the hypotension Chest x-ray stable without acute cardiopulmonary disease  EKG shows a heart rate of 87, sinus rhythm, QTc 446    Assessment and Plan: * Acute on chronic blood loss anemia - Hypotensive, generalized weakness due to symptomatic anemia Patient reports melena since his last discharge Presented with Hgb 5.4 Received 4 units PRBC, 2 units platelets this admission 3/8 EGD--Localized hemorrhagic mucosa with active bleeding and with stigmata of bleeding was found in the proximal jejunum. Upon lavage it looked as a vascularized area, ?possible Dieulafoy vs AVM.  Hemoclip placed Hgb remains stable since transfusion   Acute on chronic respiratory failure with hypoxia [facility]) Patient wears 3 L nasal cannula baseline Presents to the ED with hypoxic with a O2 sat of 87% Normalized on 4 L nasal cannula Secondary to anemia Now back to 3L Continue to monitor  Myelodysplastic syndrome [facility]) Follows Dr. Delton Coombes 07/09/21 follow up with Dr. Delton Coombes states treatment of MDS on hold due to hospitalizations for syncope Previously on Azacitidine 09/16/20 to 04/21/21 3/8--discussed with Dr. Ernst Bowler plans to restart azacitidine  Thrombocytopenia ([place])  Has been chronic due to MDS,  30-40K range -received 2 units platelets this admission -3/10 transfuse another unit PRBC (3 total for the admission)  Hypokalemia Replaced Check mag  Protein-calorie malnutrition, moderate (HCC) Body mass index is 17.04 kg/m. - Albumin 2.5 -Dietary consult Encourage nutrient dense food choices when patient is able to take p.o. Considering  adding Ensure shakes when patient is able to take p.o.  Heme  positive stool GI consulted EGD on 3/8 as discussed above  CKD (chronic kidney disease), stage IIIB Baseline creatinine 1.5-1.8 Serial BMPs  COPD (chronic obstructive pulmonary disease) (HCC) Baseline 3 L oxygen requirement Continue albuterol as needed Stable     Status is: Inpatient Remains inpatient appropriate because: severity of illness requiring multiple blood transfusions and GI procedure   Status is: Inpatient Remains inpatient appropriate because: hemodynamic instability and severity of illness       Family Communication:   daughter updated 3/10   Consultants:  GI, palliative   Code Status:  Partial DNR--no CPR, yes all other modalities   DVT Prophylaxis:  SCDs     Procedures: As Listed in Progress Note Above   Antibiotics: None          Subjective: Patient denies fevers, chills, headache, chest pain, dyspnea, nausea, vomiting, diarrhea, abdominal pain, dysuria, hematuria, hematochezia, and melena.   Objective: Vitals:   07/25/21 1200 07/25/21 1400 07/25/21 1500 07/25/21 1600  BP: (!) 105/46 (!) 94/57 (!) 86/45 (!) 97/47  Pulse: 62 78 70 65  Resp: '16 15 16 18  '$ Temp:    97.7 F (36.5 C)  TempSrc:    Oral  SpO2: 100% 100% 100% 100%  Weight:      Height:        Intake/Output Summary (Last 24 hours) at 07/25/2021 1755 Last data filed at 07/25/2021 1625 Gross per 24 hour  Intake 353.79 ml  Output 1225 ml  Net -871.21 ml   Weight change: -3.2 kg Exam:  General:  Pt is alert, follows commands appropriately, not in acute distress HEENT: No icterus, No thrush, No neck mass, [place]/AT Cardiovascular: RRR, S1/S2, no rubs, no gallops Respiratory: bibasilar crackles.  No wheeze Abdomen: Soft/+BS, non tender, non distended, no guarding Extremities: No edema, No lymphangitis, No petechiae, No rashes, no synovitis   Data Reviewed: I have personally reviewed following labs and imaging studies Basic Metabolic Panel: Recent Labs  Lab  07/21/21 2255 07/22/21 1204 07/24/21 0416 07/25/21 0405  NA 137 139 137 137  K 3.4* 3.9 3.4* 3.7  CL 108 116* 110 110  CO2 22 19* 22 23  GLUCOSE 113* 92 85 92  BUN 66* 60* 40* 31*  CREATININE 1.89* 1.57* 1.61* 1.78*  CALCIUM 8.7* 8.2* 8.3* 8.5*  MG  --  1.8  --  1.7   Liver Function Tests: Recent Labs  Lab 07/21/21 2255 07/22/21 1204  AST 18 17  ALT 15 14  ALKPHOS 50 41  BILITOT 0.5 0.5  PROT 5.8* 4.6*  ALBUMIN 2.5* 2.1*   No results for input(s): LIPASE, AMYLASE in the last 168 hours. No results for input(s): AMMONIA in the last 168 hours. Coagulation Profile: Recent Labs  Lab 07/21/21 2255  INR 1.1   CBC: Recent Labs  Lab 07/21/21 2255 07/22/21 1204 07/22/21 1755 07/23/21 0031 07/24/21 0416 07/25/21 0405  WBC 11.0* 6.4 10.9* 7.4 5.8 4.9  NEUTROABS 7.2  --   --   --   --   --   HGB 5.4* 7.1* 7.2* 9.2*  8.5* 9.0*  HCT 17.4* 21.8* 22.6* 27.5* 26.8* 28.2*  MCV 86.6 87.6 87.3 84.9 86.7 86.0  PLT 45* 29* 64* 61* 49* 45*   Cardiac Enzymes: No results for input(s): CKTOTAL, CKMB, CKMBINDEX, TROPONINI in the last 168 hours. BNP: Invalid input(s): POCBNP CBG: No results for input(s): GLUCAP in the last 168 hours. HbA1C: No results for input(s): HGBA1C in the last 72 hours. Urine analysis:    Component Value Date/Time   COLORURINE STRAW (A) 07/22/2021 0515   APPEARANCEUR CLEAR 07/22/2021 0515   APPEARANCEUR Hazy (A) 03/26/2021 1334   LABSPEC 1.012 07/22/2021 0515   PHURINE 5.0 07/22/2021 0515   GLUCOSEU NEGATIVE 07/22/2021 0515   HGBUR MODERATE (A) 07/22/2021 0515   BILIRUBINUR NEGATIVE 07/22/2021 0515   BILIRUBINUR Negative 03/26/2021 1334   KETONESUR NEGATIVE 07/22/2021 0515   PROTEINUR NEGATIVE 07/22/2021 0515   UROBILINOGEN 0.2 08/15/2013 1240   NITRITE NEGATIVE 07/22/2021 0515   LEUKOCYTESUR NEGATIVE 07/22/2021 0515   Sepsis Labs: '@LABRCNTIP'$ (procalcitonin:4,lacticidven:4) ) Recent Results (from the past 240 hour(s))  Resp Panel by RT-PCR  (Flu A&B, Covid) Nasopharyngeal Swab     Status: None   Collection Time: 07/21/21 10:55 PM   Specimen: Nasopharyngeal Swab; Nasopharyngeal(NP) swabs in vial transport medium  Result Value Ref Range Status   SARS Coronavirus 2 by RT PCR NEGATIVE NEGATIVE Final    Comment: (NOTE) SARS-CoV-2 target nucleic acids are NOT DETECTED.  The SARS-CoV-2 RNA is generally detectable in upper respiratory specimens during the acute phase of infection. The lowest concentration of SARS-CoV-2 viral copies this assay can detect is 138 copies/mL. A negative result does not preclude SARS-Cov-2 infection and should not be used as the sole basis for treatment or other patient management decisions. A negative result may occur with  improper specimen collection/handling, submission of specimen other than nasopharyngeal swab, presence of viral mutation(s) within the areas targeted by this assay, and inadequate number of viral copies(<138 copies/mL). A negative result must be combined with clinical observations, patient history, and epidemiological information. The expected result is Negative.  Fact Sheet for Patients:  EntrepreneurPulse.com.au  Fact Sheet for Healthcare Providers:  IncredibleEmployment.be  This test is no t yet approved or cleared by the [place] FDA and  has been authorized for detection and/or diagnosis of SARS-CoV-2 by FDA under an Emergency Use Authorization (EUA). This EUA will remain  in effect (meaning this test can be used) for the duration of the COVID-19 declaration under Section 564(b)(1) of the Act, 21 U.S.C.section 360bbb-3(b)(1), unless the authorization is terminated  or revoked sooner.       Influenza A by PCR NEGATIVE NEGATIVE Final   Influenza B by PCR NEGATIVE NEGATIVE Final    Comment: (NOTE) The Xpert Xpress SARS-CoV-2/FLU/RSV plus assay is intended as an aid in the diagnosis of influenza from Nasopharyngeal swab specimens  and should not be used as a sole basis for treatment. Nasal washings and aspirates are unacceptable for Xpert Xpress SARS-CoV-2/FLU/RSV testing.  Fact Sheet for Patients: EntrepreneurPulse.com.au  Fact Sheet for Healthcare Providers: IncredibleEmployment.be  This test is not yet approved or cleared by the [place] FDA and has been authorized for detection and/or diagnosis of SARS-CoV-2 by FDA under an Emergency Use Authorization (EUA). This EUA will remain in effect (meaning this test can be used) for the duration of the COVID-19 declaration under Section 564(b)(1) of the Act, 21 U.S.C. section 360bbb-3(b)(1), unless the authorization is terminated or revoked.  Performed at [facility], [address] [place], [place] [address]  Blood Culture (routine x 2)     Status: None (Preliminary result)   Collection Time: 07/21/21 10:55 PM   Specimen: Porta Cath; Blood  Result Value Ref Range Status   Specimen Description PORTA CATH  Final   Special Requests   Final    BOTTLES DRAWN AEROBIC AND ANAEROBIC Blood Culture results may not be optimal due to an inadequate volume of blood received in culture bottles   Culture   Final    NO GROWTH 4 DAYS Performed at [facility], [address] [place], [place] [address]    Report Status PENDING  Incomplete  Blood Culture (routine x 2)     Status: None (Preliminary result)   Collection Time: 07/21/21 11:16 PM   Specimen: BLOOD LEFT FOREARM  Result Value Ref Range Status   Specimen Description BLOOD LEFT FOREARM  Final   Special Requests   Final    BOTTLES DRAWN AEROBIC ONLY Blood Culture adequate volume   Culture   Final    NO GROWTH 4 DAYS Performed at [facility], [address] [place], [place] [address]    Report Status PENDING  Incomplete  MRSA Next Gen by PCR, Nasal     Status: None   Collection Time: 07/22/21  2:47 PM   Specimen: Nasal Mucosa; Nasal Swab  Result Value Ref Range Status    MRSA by PCR Next Gen NOT DETECTED NOT DETECTED Final    Comment: (NOTE) The GeneXpert MRSA Assay (FDA approved for NASAL specimens only), is one component of a comprehensive MRSA colonization surveillance program. It is not intended to diagnose MRSA infection nor to guide or monitor treatment for MRSA infections. Test performance is not FDA approved in patients less than 31 years old. Performed at [facility], [address], [place], [place] [address]      Scheduled Meds:  sodium chloride   Intravenous Once   sodium chloride   Intravenous Once   Chlorhexidine Gluconate Cloth  6 each Topical Daily   mouth rinse  15 mL Mouth Rinse BID   midodrine  2.5 mg Oral TID WC   [START ON 07/26/2021] pantoprazole  40 mg Intravenous Q12H   Continuous Infusions:  sodium chloride     pantoprazole 8 mg/hr (07/25/21 1625)    Procedures/Studies: DG Chest Port 1 View  Result Date: 07/21/2021 CLINICAL DATA:  Shortness of breath. EXAM: PORTABLE CHEST 1 VIEW COMPARISON:  July 12, 2021 FINDINGS: There is stable left-sided venous Port-A-Cath positioning. The lungs are hyperinflated. There is marked severity emphysematous lung disease with stable chronic, diffusely increased interstitial lung markings. Mild, stable atelectasis is seen within the bilateral lung bases. There is no evidence of a pleural effusion or pneumothorax. The heart size and mediastinal contours are within normal limits. Prior vertebroplasty is seen within the mid to lower thoracic spine and upper lumbar spine. IMPRESSION: Stable exam, without acute cardiopulmonary disease. Electronically Signed   By: Virgina Norfolk M.D.   On: 07/21/2021 23:21   DG Chest Port 1 View  Result Date: 07/12/2021 CLINICAL DATA:  Severe shortness of breath and weakness for 1 day. Near syncopal episode. COPD. EXAM: PORTABLE CHEST 1 VIEW COMPARISON:  06/16/2021 FINDINGS: Left-sided power port remains in appropriate position. Heart size is stable. Marked  pulmonary hyperinflation is again demonstrated. Coarse diffuse interstitial infiltrates are unchanged. No evidence of superimposed pulmonary airspace disease or pleural effusion. Lower thoracic spine vertebroplasties again noted. IMPRESSION: Stable COPD and chronic interstitial lung disease. No acute findings. Electronically Signed  By: Marlaine Hind M.D.   On: 07/12/2021 14:16    Orson Eva, DO  [facility]  If 7PM-7AM, please contact night-coverage [email or web address] Password [email or web address] 07/25/2021, 5:55 PM   LOS: 3 days

## 2021-07-25 NOTE — Discharge Planning (Signed)
Normand Dozal's daughter Stanton Kidney was contacted by telephone to verify understanding of discharge instructions status post their most recent discharge from the hospital on the date:  Anticipate 07/26/21.  Inpatient discharge AVS was re-reviewed with her, along with cancer center appointments.  Verification of understanding for oncology specific follow-up was validated using the Teach Back method.   ? ?Transportation to appointments were confirmed for the patient as being self/caregiver. Daughter will be present with him at [facility] appointments. ? ?Her questions were addressed to their satisfaction upon completion of this post discharge follow-up call for outpatient oncology.  ?

## 2021-07-26 LAB — CBC
HCT: 28.6 % — ABNORMAL LOW (ref 39.0–52.0)
Hemoglobin: 9.2 g/dL — ABNORMAL LOW (ref 13.0–17.0)
MCH: 28 pg (ref 26.0–34.0)
MCHC: 32.2 g/dL (ref 30.0–36.0)
MCV: 87.2 fL (ref 80.0–100.0)
Platelets: 58 10*3/uL — ABNORMAL LOW (ref 150–400)
RBC: 3.28 MIL/uL — ABNORMAL LOW (ref 4.22–5.81)
RDW: 16 % — ABNORMAL HIGH (ref 11.5–15.5)
WBC: 6.7 10*3/uL (ref 4.0–10.5)
nRBC: 0.7 % — ABNORMAL HIGH (ref 0.0–0.2)

## 2021-07-26 LAB — CULTURE, BLOOD (ROUTINE X 2)
Culture: NO GROWTH
Culture: NO GROWTH
Special Requests: ADEQUATE

## 2021-07-26 LAB — BASIC METABOLIC PANEL
Anion gap: 6 (ref 5–15)
BUN: 28 mg/dL — ABNORMAL HIGH (ref 8–23)
CO2: 24 mmol/L (ref 22–32)
Calcium: 8.5 mg/dL — ABNORMAL LOW (ref 8.9–10.3)
Chloride: 107 mmol/L (ref 98–111)
Creatinine, Ser: 1.89 mg/dL — ABNORMAL HIGH (ref 0.61–1.24)
GFR, Estimated: 34 mL/min — ABNORMAL LOW (ref >60)
Glucose, Bld: 97 mg/dL (ref 70–99)
Potassium: 3.6 mmol/L (ref 3.5–5.1)
Sodium: 137 mmol/L (ref 135–145)

## 2021-07-26 LAB — MAGNESIUM: Magnesium: 2.1 mg/dL (ref 1.7–2.4)

## 2021-07-26 MED ORDER — HEPARIN SOD (PORK) LOCK FLUSH 100 UNIT/ML IV SOLN
500.0000 [IU] | Freq: Once | INTRAVENOUS | Status: AC
  Administered 2021-07-26: 500 [IU] via INTRAVENOUS
  Filled 2021-07-26: qty 5

## 2021-07-26 NOTE — Evaluation (Signed)
Physical Therapy Evaluation ?Patient Details ?Name: [name] ?MRN: [id number] ?DOB: 1930/04/16 ?Today's Date: 07/26/2021 ? ?History of Present Illness ? [name] is a 88 y.o. male with medical history significant of with history of bone cancer currently getting chemo, CKD, COPD, GERD presents to ED with a chief complaint of dizziness, weakness, dyspnea  ?Clinical Impression ? PT agreeable to therapy states he feels much better. Bed mobility is I; gait is mod I.  Recommend home health for at least one visit to give input on home set up.   ?   ? ?Recommendations for follow up therapy are one component of a multi-disciplinary discharge planning process, led by the attending physician.  Recommendations may be updated based on patient status, additional functional criteria and insurance authorization. ? ?Follow Up Recommendations Home health PT ? ?  ?Assistance Recommended at Discharge Intermittent Supervision/Assistance  ?Patient can return home with the following ? Assist for transportation;Help with stairs or ramp for entrance ? ?  ?Equipment Recommendations None recommended by PT  ?Recommendations for Other Services ?   none ?  ?Functional Status Assessment Patient has had a recent decline in their functional status and demonstrates the ability to make significant improvements in function in a reasonable and predictable amount of time.  ? ?  ?Precautions / Restrictions Precautions ?Precautions: None ?Restrictions ?Weight Bearing Restrictions: No  ? ?  ? ?Mobility ? Bed Mobility ?Overal bed mobility: Independent ?  ?  ?  ?  ?  ?  ?  ?  ? ?Transfers ?Overall transfer level: Independent ?  ?  ?  ?  ?  ?  ?  ?  ?  ?  ? ?Ambulation/Gait ?Ambulation/Gait assistance: Independent ?Gait Distance (Feet): 30 Feet ?Assistive device: Rolling walker (2 wheels) (used RW for part of the ambulation then let go stating that he did not need it.) ?Gait Pattern/deviations: WFL(Within Functional Limits) ?  ?  ?  ?    ? ? ? ? ?Pertinent Vitals/Pain Pain Assessment ?Pain Assessment: No/denies pain  ? ? ?Home Living Family/patient expects to be discharged to:: Private residence ?Living Arrangements: Alone ?Available Help at Discharge: Family ?Type of Home: House ?Home Access: Ramped entrance ?  ?  ?  ?Home Layout: One level ?Home Equipment: Conservation officer, nature (2 wheels);Cane - single point ?   ?  ?Prior Function Prior Level of Function : Independent/Modified Independent ?  ?  ?  ?  ?  ?  ?Mobility Comments: Pt states he does not use his RW but has one; does not use O2 but has home O2 ?ADLs Comments: pt reports cooking simple meals, ind with self care, has housekeeper for household chores ?  ? ? ?Hand Dominance  ?   ? ?  ?Extremity/Trunk Assessment  ?   ?  ? ?Lower Extremity Assessment ?Lower Extremity Assessment: Overall WFL for tasks assessed ?  ? ?   ?Communication  ? Communication: No difficulties  ?Cognition Arousal/Alertness: Awake/alert ?Behavior During Therapy: [facility] for tasks assessed/performed ?Overall Cognitive Status: Within Functional Limits for tasks assessed ?  ?  ?  ?  ?  ?  ?  ?  ?  ?  ?  ?  ?  ?  ?  ?  ?  ?  ?  ? ?  ?   ?   ? ?Assessment/Plan  ?  ?PT Assessment All further PT needs can be met in the next venue of care  ?PT Problem List Decreased strength;Decreased activity tolerance ? ?   ?  ?   ?   ?   ? ? ?   ?  AM-PAC PT "6 Clicks" Mobility  ?Outcome Measure Help needed turning from your back to your side while in a flat bed without using bedrails?: None ?Help needed moving from lying on your back to sitting on the side of a flat bed without using bedrails?: None ?Help needed moving to and from a bed to a chair (including a wheelchair)?: None ?Help needed standing up from a chair using your arms (e.g., wheelchair or bedside chair)?: None ?Help needed to walk in hospital room?: None ?Help needed climbing 3-5 steps with a railing? : A Little ?6 Click Score: 23 ? ?  ?End of Session Equipment Utilized During Treatment:  Gait belt ?Activity Tolerance: Patient tolerated treatment well ?Patient left: in chair;with family/visitor present ?  ?PT Visit Diagnosis: Muscle weakness (generalized) (M62.81) ?  ? ?Time: 3833-3832 ?PT Time Calculation (min) (ACUTE ONLY): 15 min ? ? ?Charges:   PT Evaluation ?$PT Eval Low Complexity: 1 Low ?  ?  ?Rayetta Humphrey, PT CLT ?[phone]  ?07/26/2021, 1:27 PM ? ?

## 2021-07-26 NOTE — Discharge Summary (Signed)
Physician Discharge Summary   Patient: [name] MRN: [id number] DOB: 1929-10-06  Admit date:     07/21/2021  Discharge date: 07/26/21  Discharge Physician: Shanon Brow Shaynah Hund   PCP: Sharilyn Sites, MD   Recommendations at discharge:   Please follow up with primary care provider within 1-2 weeks  Please repeat BMP and CBC in one week  Please follow up on/with Dr. Delton Coombes on 07/28/21     Hospital Course:  [name] is a 88 y.o. male with medical history significant of with history of bone cancer currently getting chemo, CKD, COPD, GERD presents to ED with a chief complaint of dizziness, weakness, dyspnea.     Patient reports that he was post to go to the cancer center 07/21/21, but he was feeling dizzy and seeing double.  He sat down to see if rest would make it go away but it did not.  He progressively became weaker throughout the day.  He can even ambulate to make himself something to eat.  He reports exertional dyspnea as well.  He tried his inhalers and they did not help.  He denies any chest pain or abdominal pain.  He denies any hematochezia.  He does report that the first vomiting he had when he got home from his last hospitalization was black and bloody.  Since then he has had at least 2 melanotic stools per day.  He reports that he thought that that was just irritation from the previous scopes on last admission.  Patient was just discharged from the hospital on March 1.  At that time he presented with symptomatic anemia as well.  He has had numerous transfusions with packed red blood cells every 2-4 weeks.  He received a total of 5 units in the last admission.  s/p EGD 2/28 with clotted blood and GE junction biopsied, injected, clip placed, sloughing of mucosa in the esophagus, gastritis.  Surgical pathology showed presence of necroinflammatory debris but no other abnormalities.     Today's presentation is very similar to the last presentation. Patient reports he does not smoke,  does not drink alcohol, does not use illicit drugs.   He is vaccinated for COVID.   Patient is partial code.  He does not want CPR.  In the ED Temp 97.7, heart rate 91-97, respiratory rate 14-18, blood pressure 84/59-108/48 Initially satting 87%, improved to 94% on 4 L nasal cannula CBC shows a hemoglobin of 5.4 potassium of 3.4 BUN of 66 and a creatinine of 1.9 Albumin is 2.5 Initial lactic acid is 2.3, repeat 1.4 BC smear showed polychromasia, schistocytes, teardrop cells, left shift FOBT positive Blood cultures are pending as there was initially a code sepsis with the hypotension Chest x-ray stable without acute cardiopulmonary disease  EKG shows a heart rate of 87, sinus rhythm, QTc 446  Assessment and Plan: * Acute on chronic blood loss anemia - Hypotensive, generalized weakness due to symptomatic anemia Patient reports melena since his last discharge Presented with Hgb 5.4 Received 4 units PRBC, 3 units platelets this admission 3/8 EGD--Localized hemorrhagic mucosa with active bleeding and with stigmata of bleeding was found in the proximal jejunum. Upon lavage it looked as a vascularized area, ?possible Dieulafoy vs AVM.  Hemoclip placed Hgb remains stable since transfusion--9.2 on day of d/c   Acute on chronic respiratory failure with hypoxia [facility]) Patient wears 3 L nasal cannula baseline Presents to the ED with hypoxic with a O2 sat of 87% Normalized on 4 L nasal cannula  Secondary to anemia Now back to 3L  Myelodysplastic syndrome [facility]) Follows Dr. Delton Coombes 07/09/21 follow up with Dr. Delton Coombes states treatment of MDS on hold due to hospitalizations for syncope Previously on Azacitidine 09/16/20 to 04/21/21 3/8--discussed with Dr. Ernst Bowler plans to restart azacitidine Pt has follow up with Dr. Delton Coombes on 07/28/21 at 8:15AM  Thrombocytopenia ([place]) Has been chronic due to MDS,  30-40K range -received 2 units platelets this admission -3/10 transfuse another  unit PRBC (3 total for the admission)  Hypokalemia Replaced Check mag--2.1  Protein-calorie malnutrition, moderate (HCC) Body mass index is 17.04 kg/m. - Albumin 2.5 -Dietary consult Encourage nutrient dense food choices when patient is able to take p.o. Considering  adding Ensure shakes when patient is able to take p.o.  Heme positive stool GI consulted EGD on 3/8 as discussed above No further melena at time of d/c  CKD (chronic kidney disease), stage IIIB Baseline creatinine 1.5-1.8 Serial BMPs  COPD (chronic obstructive pulmonary disease) (HCC) Baseline 3 L oxygen requirement Continue albuterol as needed Stable           Consultants: med/onc, GI Procedures performed: EGD  Disposition: Home Diet recommendation:  Regular diet DISCHARGE MEDICATION: Allergies as of 07/26/2021   No Known Allergies      Medication List     TAKE these medications    albuterol 108 (90 Base) MCG/ACT inhaler Commonly known as: VENTOLIN HFA Inhale 2 puffs into the lungs every 4 (four) hours as needed for wheezing or shortness of breath.   midodrine 2.5 MG tablet Commonly known as: PROAMATINE Take 1 tablet (2.5 mg total) by mouth 3 (three) times daily with meals.   pantoprazole 40 MG tablet Commonly known as: Protonix Take 1 tablet (40 mg total) by mouth 2 (two) times daily.   predniSONE 5 MG tablet Commonly known as: DELTASONE Take 5 mg by mouth daily with breakfast.   silodosin 4 MG Caps capsule Commonly known as: RAPAFLO Take 4 mg by mouth at bedtime.        Follow-up Information     Care, [facility] Follow up.   Specialty: Home Health Services Why: Will contact you to schedule home health visits. Contact information: [place] [address] [phone]                Discharge Exam: Filed Weights   07/23/21 0500 07/24/21 0439 07/25/21 0437  Weight: 55.2 kg 67.7 kg 64.5 kg   HEENT:  [place]/AT, No thrush, no  icterus CV:  RRR, no rub, no S3, no S4 Lung:  CTA, no wheeze, no rhonchi Abd:  soft/+BS, NT Ext:  No edema, no lymphangitis, no synovitis, no rash   Condition at discharge: stable  The results of significant diagnostics from this hospitalization (including imaging, microbiology, ancillary and laboratory) are listed below for reference.   Imaging Studies: DG Chest Port 1 View  Result Date: 07/21/2021 CLINICAL DATA:  Shortness of breath. EXAM: PORTABLE CHEST 1 VIEW COMPARISON:  July 12, 2021 FINDINGS: There is stable left-sided venous Port-A-Cath positioning. The lungs are hyperinflated. There is marked severity emphysematous lung disease with stable chronic, diffusely increased interstitial lung markings. Mild, stable atelectasis is seen within the bilateral lung bases. There is no evidence of a pleural effusion or pneumothorax. The heart size and mediastinal contours are within normal limits. Prior vertebroplasty is seen within the mid to lower thoracic spine and upper lumbar spine. IMPRESSION: Stable exam, without acute cardiopulmonary disease. Electronically Signed   By:  Virgina Norfolk M.D.   On: 07/21/2021 23:21   DG Chest Port 1 View  Result Date: 07/12/2021 CLINICAL DATA:  Severe shortness of breath and weakness for 1 day. Near syncopal episode. COPD. EXAM: PORTABLE CHEST 1 VIEW COMPARISON:  06/16/2021 FINDINGS: Left-sided power port remains in appropriate position. Heart size is stable. Marked pulmonary hyperinflation is again demonstrated. Coarse diffuse interstitial infiltrates are unchanged. No evidence of superimposed pulmonary airspace disease or pleural effusion. Lower thoracic spine vertebroplasties again noted. IMPRESSION: Stable COPD and chronic interstitial lung disease. No acute findings. Electronically Signed   By: Marlaine Hind M.D.   On: 07/12/2021 14:16    Microbiology: Results for orders placed or performed during the hospital encounter of 07/21/21  Resp Panel by  RT-PCR (Flu A&B, Covid) Nasopharyngeal Swab     Status: None   Collection Time: 07/21/21 10:55 PM   Specimen: Nasopharyngeal Swab; Nasopharyngeal(NP) swabs in vial transport medium  Result Value Ref [place] Status   SARS Coronavirus 2 by RT PCR NEGATIVE NEGATIVE Final    Comment: (NOTE) SARS-CoV-2 target nucleic acids are NOT DETECTED.  The SARS-CoV-2 RNA is generally detectable in upper respiratory specimens during the acute phase of infection. The lowest concentration of SARS-CoV-2 viral copies this assay can detect is 138 copies/mL. A negative result does not preclude SARS-Cov-2 infection and should not be used as the sole basis for treatment or other patient management decisions. A negative result may occur with  improper specimen collection/handling, submission of specimen other than nasopharyngeal swab, presence of viral mutation(s) within the areas targeted by this assay, and inadequate number of viral copies(<138 copies/mL). A negative result must be combined with clinical observations, patient history, and epidemiological information. The expected result is Negative.  Fact Sheet for Patients:  EntrepreneurPulse.com.au  Fact Sheet for Healthcare Providers:  IncredibleEmployment.be  This test is no t yet approved or cleared by the [place] FDA and  has been authorized for detection and/or diagnosis of SARS-CoV-2 by FDA under an Emergency Use Authorization (EUA). This EUA will remain  in effect (meaning this test can be used) for the duration of the COVID-19 declaration under Section 564(b)(1) of the Act, 21 U.S.C.section 360bbb-3(b)(1), unless the authorization is terminated  or revoked sooner.       Influenza A by PCR NEGATIVE NEGATIVE Final   Influenza B by PCR NEGATIVE NEGATIVE Final    Comment: (NOTE) The Xpert Xpress SARS-CoV-2/FLU/RSV plus assay is intended as an aid in the diagnosis of influenza from Nasopharyngeal swab  specimens and should not be used as a sole basis for treatment. Nasal washings and aspirates are unacceptable for Xpert Xpress SARS-CoV-2/FLU/RSV testing.  Fact Sheet for Patients: EntrepreneurPulse.com.au  Fact Sheet for Healthcare Providers: IncredibleEmployment.be  This test is not yet approved or cleared by the [place] FDA and has been authorized for detection and/or diagnosis of SARS-CoV-2 by FDA under an Emergency Use Authorization (EUA). This EUA will remain in effect (meaning this test can be used) for the duration of the COVID-19 declaration under Section 564(b)(1) of the Act, 21 U.S.C. section 360bbb-3(b)(1), unless the authorization is terminated or revoked.  Performed at [facility], [address], [place], [place] [address]   Blood Culture (routine x 2)     Status: None   Collection Time: 07/21/21 10:55 PM   Specimen: Porta Cath; Blood  Result Value Ref [place] Status   Specimen Description PORTA CATH  Final   Special Requests   Final    BOTTLES DRAWN  AEROBIC AND ANAEROBIC Blood Culture results may not be optimal due to an inadequate volume of blood received in culture bottles   Culture   Final    NO GROWTH 5 DAYS Performed at [facility], [address], [place], [place] [address]    Report Status 07/26/2021 FINAL  Final  Blood Culture (routine x 2)     Status: None   Collection Time: 07/21/21 11:16 PM   Specimen: BLOOD LEFT FOREARM  Result Value Ref [place] Status   Specimen Description BLOOD LEFT FOREARM  Final   Special Requests   Final    BOTTLES DRAWN AEROBIC ONLY Blood Culture adequate volume   Culture   Final    NO GROWTH 5 DAYS Performed at [facility], [address], [place], [place] [address]    Report Status 07/26/2021 FINAL  Final  MRSA Next Gen by PCR, Nasal     Status: None   Collection Time: 07/22/21  2:47 PM   Specimen: Nasal Mucosa; Nasal Swab  Result Value Ref [place] Status   MRSA by PCR Next Gen  NOT DETECTED NOT DETECTED Final    Comment: (NOTE) The GeneXpert MRSA Assay (FDA approved for NASAL specimens only), is one component of a comprehensive MRSA colonization surveillance program. It is not intended to diagnose MRSA infection nor to guide or monitor treatment for MRSA infections. Test performance is not FDA approved in patients less than 74 years old. Performed at [facility], [address] [place], [place] [address]     Labs: CBC: Recent Labs  Lab 07/21/21 2255 07/22/21 1204 07/22/21 1755 07/23/21 0031 07/24/21 0416 07/25/21 0405 07/26/21 0338  WBC 11.0*   < > 10.9* 7.4 5.8 4.9 6.7  NEUTROABS 7.2  --   --   --   --   --   --   HGB 5.4*   < > 7.2* 9.2* 8.5* 9.0* 9.2*  HCT 17.4*   < > 22.6* 27.5* 26.8* 28.2* 28.6*  MCV 86.6   < > 87.3 84.9 86.7 86.0 87.2  PLT 45*   < > 64* 61* 49* 45* 58*   < > = values in this interval not displayed.   Basic Metabolic Panel: Recent Labs  Lab 07/21/21 2255 07/22/21 1204 07/24/21 0416 07/25/21 0405 07/26/21 0338  NA 137 139 137 137 137  K 3.4* 3.9 3.4* 3.7 3.6  CL 108 116* 110 110 107  CO2 22 19* '22 23 24  '$ GLUCOSE 113* 92 85 92 97  BUN 66* 60* 40* 31* 28*  CREATININE 1.89* 1.57* 1.61* 1.78* 1.89*  CALCIUM 8.7* 8.2* 8.3* 8.5* 8.5*  MG  --  1.8  --  1.7 2.1   Liver Function Tests: Recent Labs  Lab 07/21/21 2255 07/22/21 1204  AST 18 17  ALT 15 14  ALKPHOS 50 41  BILITOT 0.5 0.5  PROT 5.8* 4.6*  ALBUMIN 2.5* 2.1*   CBG: No results for input(s): GLUCAP in the last 168 hours.  Discharge time spent: greater than 30 minutes.  Signed: Orson Eva, MD [facility] 07/26/2021

## 2021-07-28 ENCOUNTER — Inpatient Hospital Stay (HOSPITAL_COMMUNITY): Payer: Medicare Other | Admitting: Dietician

## 2021-07-28 ENCOUNTER — Inpatient Hospital Stay (HOSPITAL_COMMUNITY): Payer: Medicare Other

## 2021-07-28 ENCOUNTER — Encounter (HOSPITAL_COMMUNITY): Payer: Self-pay | Admitting: Gastroenterology

## 2021-07-28 ENCOUNTER — Ambulatory Visit (HOSPITAL_COMMUNITY): Payer: Medicare Other

## 2021-07-28 ENCOUNTER — Ambulatory Visit (HOSPITAL_COMMUNITY): Payer: Medicare Other | Admitting: Hematology

## 2021-07-28 ENCOUNTER — Other Ambulatory Visit (HOSPITAL_COMMUNITY): Payer: Medicare Other

## 2021-07-28 ENCOUNTER — Other Ambulatory Visit: Payer: Self-pay

## 2021-07-28 ENCOUNTER — Inpatient Hospital Stay (HOSPITAL_COMMUNITY): Payer: Medicare Other | Attending: Hematology

## 2021-07-28 ENCOUNTER — Inpatient Hospital Stay (HOSPITAL_BASED_OUTPATIENT_CLINIC_OR_DEPARTMENT_OTHER): Payer: Medicare Other | Admitting: Hematology

## 2021-07-28 DIAGNOSIS — D61818 Other pancytopenia: Secondary | ICD-10-CM | POA: Insufficient documentation

## 2021-07-28 DIAGNOSIS — N1832 Chronic kidney disease, stage 3b: Secondary | ICD-10-CM | POA: Diagnosis not present

## 2021-07-28 DIAGNOSIS — D469 Myelodysplastic syndrome, unspecified: Secondary | ICD-10-CM

## 2021-07-28 DIAGNOSIS — J449 Chronic obstructive pulmonary disease, unspecified: Secondary | ICD-10-CM | POA: Diagnosis not present

## 2021-07-28 DIAGNOSIS — Z841 Family history of disorders of kidney and ureter: Secondary | ICD-10-CM | POA: Insufficient documentation

## 2021-07-28 DIAGNOSIS — Z87891 Personal history of nicotine dependence: Secondary | ICD-10-CM | POA: Diagnosis not present

## 2021-07-28 DIAGNOSIS — Z79899 Other long term (current) drug therapy: Secondary | ICD-10-CM | POA: Diagnosis not present

## 2021-07-28 DIAGNOSIS — D649 Anemia, unspecified: Secondary | ICD-10-CM

## 2021-07-28 LAB — CBC WITH DIFFERENTIAL/PLATELET
Band Neutrophils: 2 %
Basophils Absolute: 0 10*3/uL (ref 0.0–0.1)
Basophils Relative: 0 %
Blasts: 3 %
Eosinophils Absolute: 0.2 10*3/uL (ref 0.0–0.5)
Eosinophils Relative: 2 %
HCT: 30.8 % — ABNORMAL LOW (ref 39.0–52.0)
Hemoglobin: 9.6 g/dL — ABNORMAL LOW (ref 13.0–17.0)
Lymphocytes Relative: 19 %
Lymphs Abs: 1.8 10*3/uL (ref 0.7–4.0)
MCH: 27.1 pg (ref 26.0–34.0)
MCHC: 31.2 g/dL (ref 30.0–36.0)
MCV: 87 fL (ref 80.0–100.0)
Metamyelocytes Relative: 5 %
Monocytes Absolute: 0.4 10*3/uL (ref 0.1–1.0)
Monocytes Relative: 4 %
Myelocytes: 1 %
Neutro Abs: 6.4 10*3/uL (ref 1.7–7.7)
Neutrophils Relative %: 64 %
Platelets: 58 10*3/uL — ABNORMAL LOW (ref 150–400)
RBC: 3.54 MIL/uL — ABNORMAL LOW (ref 4.22–5.81)
RDW: 16.1 % — ABNORMAL HIGH (ref 11.5–15.5)
WBC: 9.7 10*3/uL (ref 4.0–10.5)
nRBC: 0.8 % — ABNORMAL HIGH (ref 0.0–0.2)

## 2021-07-28 LAB — BPAM PLATELET PHERESIS
Blood Product Expiration Date: 202303142359
ISSUE DATE / TIME: 202303102006
Unit Type and Rh: 7300

## 2021-07-28 LAB — PREPARE PLATELET PHERESIS: Unit division: 0

## 2021-07-28 LAB — SAMPLE TO BLOOD BANK

## 2021-07-28 MED ORDER — SODIUM CHLORIDE 0.9% FLUSH
10.0000 mL | INTRAVENOUS | Status: DC | PRN
  Administered 2021-07-28: 10 mL via INTRAVENOUS

## 2021-07-28 MED ORDER — HEPARIN SOD (PORK) LOCK FLUSH 100 UNIT/ML IV SOLN
500.0000 [IU] | Freq: Once | INTRAVENOUS | Status: AC
  Administered 2021-07-28: 500 [IU] via INTRAVENOUS

## 2021-07-28 NOTE — Progress Notes (Signed)
Pt presents today for possible blood transfusion today per provider's order. Pt's hemoglobin noted to be 9.6 today and platelets are 58 today. No blood or platelets needed today per Dr.K. We will do twice weekly labs for the next two weeks per Dr.K. ?

## 2021-07-28 NOTE — Progress Notes (Signed)
[name], [name]   CLINIC:  Medical Oncology/Hematology  PCP:  Sharilyn Sites, [place] / [place] [address]  [phone]  REASON FOR VISIT:  Follow-up for MDS  PRIOR THERAPY: Aranesp, Retacrit, intermittent PRBC transfusions  CURRENT THERAPY: Azacitidine (initiated on 09/16/2020)  INTERVAL HISTORY:  [name], a 88 y.o. male, returns for routine follow-up for his MDS. [name] was last seen on 07/07/2021.  Today he reports feeling good, and he is accompanied by his daughter. He walks at home with assistance of a cane. He is eating has been good, and his appetite is fair. He will eat if food has been prepared for him, but he does not feel motivated to prepare food for himself. He is taking 5 mg midodrine TID. He denies dizziness upon standing. He reports he is eating 2 meals daily, and he is trying to eat 3 times daily.   REVIEW OF SYSTEMS:  Review of Systems  Constitutional:  Negative for appetite change and fatigue.  Neurological:  Negative for dizziness.  All other systems reviewed and are negative.  PAST MEDICAL/SURGICAL HISTORY:  Past Medical History:  Diagnosis Date   Bone cancer ([place])    Chronic kidney insufficiency    COPD (chronic obstructive pulmonary disease) ([facility])    GERD (gastroesophageal reflux disease)    Pulmonary nodule    Past Surgical History:  Procedure Laterality Date   BACK SURGERY     BIOPSY  07/15/2021   Procedure: BIOPSY;  Surgeon: Harvel Quale, MD;  [facility];  Service: Gastroenterology;;   CATARACT EXTRACTION W/PHACO Right 03/09/2016   Procedure: CATARACT EXTRACTION PHACO AND INTRAOCULAR LENS PLACEMENT RIGHT EYE CDE=8.58;  Surgeon: Tonny Branch, MD;  Location: [facility];  Service: Ophthalmology;  Laterality: Right;  right   CATARACT EXTRACTION W/PHACO Left 04/13/2016   Procedure: CATARACT EXTRACTION PHACO AND INTRAOCULAR LENS PLACEMENT (IOC);   Surgeon: Tonny Branch, MD;  Location: [facility];  Service: Ophthalmology;  Laterality: Left;  CDE: 8.08   ENTEROSCOPY  07/23/2021   Procedure: ENTEROSCOPY;  Surgeon: Harvel Quale, MD;  [facility];  Service: Gastroenterology;;   ESOPHAGOGASTRODUODENOSCOPY (EGD) WITH PROPOFOL N/A 07/14/2021   Procedure: ESOPHAGOGASTRODUODENOSCOPY (EGD) WITH PROPOFOL;  Surgeon: Eloise Harman, DO;  [facility];  Service: Endoscopy;  Laterality: N/A;   ESOPHAGOGASTRODUODENOSCOPY (EGD) WITH PROPOFOL N/A 07/15/2021   Procedure: ESOPHAGOGASTRODUODENOSCOPY (EGD) WITH PROPOFOL;  Surgeon: Harvel Quale, MD;  [facility];  Service: Gastroenterology;  Laterality: N/A;   ESOPHAGOGASTRODUODENOSCOPY (EGD) WITH PROPOFOL N/A 07/23/2021   Procedure: ESOPHAGOGASTRODUODENOSCOPY (EGD) WITH PROPOFOL;  Surgeon: Harvel Quale, MD;  [facility];  Service: Gastroenterology;  Laterality: N/A;   HEMOSTASIS CLIP PLACEMENT  07/15/2021   Procedure: HEMOSTASIS CLIP PLACEMENT;  Surgeon: Harvel Quale, MD;  [facility];  Service: Gastroenterology;;   HEMOSTASIS CLIP PLACEMENT  07/23/2021   Procedure: HEMOSTASIS CLIP PLACEMENT;  Surgeon: Harvel Quale, MD;  [facility];  Service: Gastroenterology;;   KYPHOPLASTY Bilateral 06/09/2019   Procedure: T11 KYPHOPLASTY;  Surgeon: Consuella Lose, MD;  Location: [facility];  Service: Neurosurgery;  Laterality: Bilateral;   None to Date  10/01/15   PORTACATH PLACEMENT Left 10/11/2020   Procedure: INSERTION PORT-A-CATH (attached catheter in left internal jugular);  Surgeon: Virl Cagey, MD;  Location: [facility];  Service: General;  Laterality: Left;    SOCIAL HISTORY:  Social History  Socioeconomic History   Marital status: Widowed    Spouse name: Not on file   Number of children: Not on file   Years of education: Not on file   Highest education level: Not on file  Occupational  History   Not on file  Tobacco Use   Smoking status: Former    Years: 15.00    Types: Cigarettes, Cigars    Quit date: 10/01/1990    Years since quitting: 30.8   Smokeless tobacco: Never   Tobacco comments:    Quit x 25-30 years; 2 cigars daily when smoked  Vaping Use   Vaping Use: Never used  Substance and Sexual Activity   Alcohol use: Not Currently    Alcohol/week: 0.0 standard drinks   Drug use: No   Sexual activity: Never    Birth control/protection: None  Other Topics Concern   Not on file  Social History Narrative   Not on file   Social Determinants of Health   Financial Resource Strain: Not on file  Food Insecurity: Not on file  Transportation Needs: Not on file  Physical Activity: Not on file  Stress: Not on file  Social Connections: Not on file  Intimate Partner Violence: Not on file    FAMILY HISTORY:  Family History  Problem Relation Age of Onset   Kidney disease Mother    Kidney disease Sister    Colon cancer Neg Hx    Gastric cancer Neg Hx    Esophageal cancer Neg Hx     CURRENT MEDICATIONS:  Current Outpatient Medications  Medication Sig Dispense Refill   albuterol (VENTOLIN HFA) 108 (90 Base) MCG/ACT inhaler Inhale 2 puffs into the lungs every 4 (four) hours as needed for wheezing or shortness of breath. 18 g 2   midodrine (PROAMATINE) 2.5 MG tablet Take 1 tablet (2.5 mg total) by mouth 3 (three) times daily with meals. 270 each 3   pantoprazole (PROTONIX) 40 MG tablet Take 1 tablet (40 mg total) by mouth 2 (two) times daily. 30 tablet 1   predniSONE (DELTASONE) 5 MG tablet Take 5 mg by mouth daily with breakfast.     silodosin (RAPAFLO) 4 MG CAPS capsule Take 4 mg by mouth at bedtime.     No current facility-administered medications for this visit.   Facility-Administered Medications Ordered in Other Visits  Medication Dose Route Frequency Provider Last Rate Last Admin   0.9 %  sodium chloride infusion   Intravenous Continuous Derek Jack, MD   Stopped at 01/06/21 1018   octreotide (SANDOSTATIN LAR) 30 MG IM injection            sodium chloride flush (NS) 0.9 % injection 10 mL  10 mL Intracatheter PRN Derek Jack, MD   10 mL at 01/06/21 1215   sodium chloride flush (NS) 0.9 % injection 10 mL  10 mL Intravenous PRN Derek Jack, MD   10 mL at 02/17/21 0902   sodium chloride flush (NS) 0.9 % injection 10 mL  10 mL Intravenous PRN Derek Jack, MD   10 mL at 03/10/21 0936    ALLERGIES:  No Known Allergies  PHYSICAL EXAM:  Performance status (ECOG): 2 - Symptomatic, <50% confined to bed  There were no vitals filed for this visit. Wt Readings from Last 3 Encounters:  07/25/21 142 lb 3.2 oz (64.5 kg)  07/15/21 125 lb 10.6 oz (57 kg)  07/07/21 118 lb (53.5 kg)   Physical Exam Vitals reviewed.  Constitutional:  Appearance: Normal appearance.     Interventions: Nasal cannula in place.  Cardiovascular:     Rate and Rhythm: Normal rate and regular rhythm.     Pulses: Normal pulses.     Heart sounds: Normal heart sounds.  Pulmonary:     Effort: Pulmonary effort is normal.     Breath sounds: Normal breath sounds.  Neurological:     General: No focal deficit present.     Mental Status: He is alert and oriented to person, place, and time.  Psychiatric:        Mood and Affect: Mood normal.        Behavior: Behavior normal.    LABORATORY DATA:  I have reviewed the labs as listed.  CBC Latest Ref Rng & Units 07/28/2021 07/26/2021 07/25/2021  WBC 4.0 - 10.5 K/uL 9.7 6.7 4.9  Hemoglobin 13.0 - 17.0 g/dL 9.6(L) 9.2(L) 9.0(L)  Hematocrit 39.0 - 52.0 % 30.8(L) 28.6(L) 28.2(L)  Platelets 150 - 400 K/uL 58(L) 58(L) 45(L)   CMP Latest Ref Rng & Units 07/26/2021 07/25/2021 07/24/2021  Glucose 70 - 99 mg/dL 97 92 85  BUN 8 - 23 mg/dL 28(H) 31(H) 40(H)  Creatinine 0.61 - 1.24 mg/dL 1.89(H) 1.78(H) 1.61(H)  Sodium 135 - 145 mmol/L 137 137 137  Potassium 3.5 - 5.1 mmol/L 3.6 3.7 3.4(L)  Chloride 98  - 111 mmol/L 107 110 110  CO2 22 - 32 mmol/L $RemoveB'24 23 22  'OVGTacvv$ Calcium 8.9 - 10.3 mg/dL 8.5(L) 8.5(L) 8.3(L)  Total Protein 6.5 - 8.1 g/dL - - -  Total Bilirubin 0.3 - 1.2 mg/dL - - -  Alkaline Phos 38 - 126 U/L - - -  AST 15 - 41 U/L - - -  ALT 0 - 44 U/L - - -      Component Value Date/Time   RBC 3.54 (L) 07/28/2021 0814   MCV 87.0 07/28/2021 0814   MCH 27.1 07/28/2021 0814   MCHC 31.2 07/28/2021 0814   RDW 16.1 (H) 07/28/2021 0814   LYMPHSABS 1.8 07/28/2021 0814   MONOABS 0.4 07/28/2021 0814   EOSABS 0.2 07/28/2021 0814   BASOSABS 0.0 07/28/2021 0814    DIAGNOSTIC IMAGING:  I have independently reviewed the scans and discussed with the patient. DG Chest Port 1 View  Result Date: 07/21/2021 CLINICAL DATA:  Shortness of breath. EXAM: PORTABLE CHEST 1 VIEW COMPARISON:  July 12, 2021 FINDINGS: There is stable left-sided venous Port-A-Cath positioning. The lungs are hyperinflated. There is marked severity emphysematous lung disease with stable chronic, diffusely increased interstitial lung markings. Mild, stable atelectasis is seen within the bilateral lung bases. There is no evidence of a pleural effusion or pneumothorax. The heart size and mediastinal contours are within normal limits. Prior vertebroplasty is seen within the mid to lower thoracic spine and upper lumbar spine. IMPRESSION: Stable exam, without acute cardiopulmonary disease. Electronically Signed   By: Virgina Norfolk M.D.   On: 07/21/2021 23:21   DG Chest Port 1 View  Result Date: 07/12/2021 CLINICAL DATA:  Severe shortness of breath and weakness for 1 day. Near syncopal episode. COPD. EXAM: PORTABLE CHEST 1 VIEW COMPARISON:  06/16/2021 FINDINGS: Left-sided power port remains in appropriate position. Heart size is stable. Marked pulmonary hyperinflation is again demonstrated. Coarse diffuse interstitial infiltrates are unchanged. No evidence of superimposed pulmonary airspace disease or pleural effusion. Lower thoracic spine  vertebroplasties again noted. IMPRESSION: Stable COPD and chronic interstitial lung disease. No acute findings. Electronically Signed   By: Myles Rosenthal.D.  On: 07/12/2021 14:16     ASSESSMENT:  1.  Myelodysplastic syndrome with pancytopenia: - Bone marrow biopsy on 12/08/2016 in [place], [place] showed normocellular marrow with trilineage hematopoiesis with erythroid predominance and dysmegakaryopoiesis.  Blasts were not increased.  Cytogenetics showed 46, XY,del(12)(p.2p13)[8]/46,XY[12].  FISH was positive for 12p-. - Bone marrow biopsy and testing in 2018 was consistent with myelodysplastic syndrome with single lineage dysplasia (MDS-SLD),IPSS-R score of 2, consistent with low risk category -  Repeat bone marrow biopsy performed on 08/13/2019 by Dr. Delton Coombes with the following results: Hypercellular bone marrow with dyspoietic changes Bone marrow aspirate with 5% blastic cells of granulocytic precursors Flow pathology with 3% blasts Peripheral blood with 2% circulating blasts Cytogenetic analysis show del(12p) and 13 of the examined cells with remaining 7 cells showing normal chromosome Karyotype: 46,XY,del(12)(p13p11.2)[13]/46,XY[7] FISH analysis was normal - Based on most recent bone marrow biopsy (08/04/2020), patient's IPSS-R is 5, showing that he has progressed to the high risk category - He has required multiple blood transfusions over the past 6 months, most recently on 08/09/2020 (Hgb 7.1) - No improvement on monthly Aranesp or on weekly Retacrit injections (started on 07/10/2020 - discontinued 09/04/2020) - Stool occult blood cards x3 were negative -Azacitidine (50 mg/m2) x5 days started on 09/16/2020.  Seventh cycle completed on 04/21/2021. - Bone marrow biopsy on 05/26/2021-hypercellular marrow with aspirate showing 6% blastic cells.  Previous bone marrow showed 5% blastic cells. - MDS FISH panel was normal. - Cytogenetics showed 46, XY, del (12) in 10 chromosomes and 10 normal  chromosomes which is similar to previous marrow.   PLAN:  1.  Myelodysplastic syndrome (high risk) with pancytopenia: - He was recently hospitalized again and had to have a EGD and clipping at the jejunal bleeding lesion.  He is accompanied by his daughter Stanton Kidney today. - He received 2 units PRBC on 07/22/2021.  He received 1 unit PLT on 07/25/2021. - I have reviewed hospitalization records. - Reviewed CBC from today which shows hemoglobin is 9.6 and stable.  Platelet count is 58 and stable from 07/26/2021. - No indication for transfusion at this time.  We talked about home health to come and help him with day-to-day activities.  We will check his CBC again on Thursday and twice weekly from here on until they are stable. - We are holding off on treatment of his MDS indefinitely at this time.  RTC 2 weeks for follow-up.  2.  Hypotension: - Midodrine 2.5 mg 3 times daily started on 06/23/2021. - At last visit, it was increased to 5 mg 3 times daily. - Blood pressure today is 98/66.  He does not report any dizziness. - Continue midodrine 5 mg 3 times daily.  3.  Bladder problems: - Continue Rapaflo daily.  4.  Weight loss: - He is eating about 2 meals per day. - We will start him on Megace 400 mg daily.   Orders placed this encounter:  No orders of the defined types were placed in this encounter.    Derek Jack, MD [place] [phone]   I, Thana Ates, am acting as a scribe for Dr. Derek Jack.  I, Derek Jack MD, have reviewed the above documentation for accuracy and completeness, and I agree with the above.

## 2021-07-28 NOTE — Telephone Encounter (Signed)
Patiewnt was discharge 07/26/2021 had cbc done today 07/28/2021 and is scheduled to see [facility] on 07/31/2021. Will he need another cbc prior to the appt here on 07/31/2021? ?

## 2021-07-28 NOTE — Patient Instructions (Addendum)
[place] at [facility] ?Discharge Instructions ? ? ?You were seen and examined today by Dr. Delton Coombes. ? ?He reviewed your blood work. Your hemoglobin is 9.6 and your platelets in the 50s.  So you are maintaining your blood counts. We will check your blood counts again later this week.  We will give you blood or platelets as needed.  ? ?Return as scheduled.  ? ? ?Thank you for choosing [place] at [facility] to provide your oncology and hematology care.  To afford each patient quality time with our provider, please arrive at least 15 minutes before your scheduled appointment time.  ? ?If you have a lab appointment with the [facility] please come in thru the Main Entrance and check in at the main information desk. ? ?You need to re-schedule your appointment should you arrive 10 or more minutes late.  We strive to give you quality time with our providers, and arriving late affects you and other patients whose appointments are after yours.  Also, if you no show three or more times for appointments you may be dismissed from the clinic at the providers discretion.     ?Again, thank you for choosing [facility].  Our hope is that these requests will decrease the amount of time that you wait before being seen by our physicians.       ?_____________________________________________________________ ? ?Should you have questions after your visit to [facility], please contact our office at [phone] and follow the prompts.  Our office hours are 8:00 a.m. and 4:30 p.m. Monday - Friday.  Please note that voicemails left after 4:00 p.m. may not be returned until the following business day.  We are closed weekends and major holidays.  You do have access to a nurse 24-7, just call the main number to the clinic [phone] and do not press any options, hold on the line and a nurse will answer the phone.   ? ?For prescription refill requests, have  your pharmacy contact our office and allow 72 hours.   ? ?Due to Covid, you will need to wear a mask upon entering the hospital. If you do not have a mask, a mask will be given to you at the Main Entrance upon arrival. For doctor visits, patients may have 1 support person age 29 or older with them. For treatment visits, patients can not have anyone with them due to social distancing guidelines and our immunocompromised population.  ? ?   ?

## 2021-07-29 ENCOUNTER — Ambulatory Visit (HOSPITAL_COMMUNITY): Payer: Medicare Other

## 2021-07-29 DIAGNOSIS — D508 Other iron deficiency anemias: Secondary | ICD-10-CM | POA: Diagnosis not present

## 2021-07-29 DIAGNOSIS — Z681 Body mass index (BMI) 19 or less, adult: Secondary | ICD-10-CM | POA: Diagnosis not present

## 2021-07-29 DIAGNOSIS — M1990 Unspecified osteoarthritis, unspecified site: Secondary | ICD-10-CM | POA: Diagnosis not present

## 2021-07-29 DIAGNOSIS — D469 Myelodysplastic syndrome, unspecified: Secondary | ICD-10-CM | POA: Diagnosis not present

## 2021-07-29 DIAGNOSIS — Z9981 Dependence on supplemental oxygen: Secondary | ICD-10-CM | POA: Diagnosis not present

## 2021-07-29 DIAGNOSIS — I959 Hypotension, unspecified: Secondary | ICD-10-CM | POA: Diagnosis not present

## 2021-07-29 DIAGNOSIS — D649 Anemia, unspecified: Secondary | ICD-10-CM | POA: Diagnosis not present

## 2021-07-30 ENCOUNTER — Ambulatory Visit (HOSPITAL_COMMUNITY): Payer: Medicare Other

## 2021-07-30 DIAGNOSIS — E876 Hypokalemia: Secondary | ICD-10-CM | POA: Diagnosis not present

## 2021-07-30 DIAGNOSIS — R911 Solitary pulmonary nodule: Secondary | ICD-10-CM | POA: Diagnosis not present

## 2021-07-30 DIAGNOSIS — D500 Iron deficiency anemia secondary to blood loss (chronic): Secondary | ICD-10-CM | POA: Diagnosis not present

## 2021-07-30 DIAGNOSIS — K219 Gastro-esophageal reflux disease without esophagitis: Secondary | ICD-10-CM | POA: Diagnosis not present

## 2021-07-30 DIAGNOSIS — J9621 Acute and chronic respiratory failure with hypoxia: Secondary | ICD-10-CM | POA: Diagnosis not present

## 2021-07-30 DIAGNOSIS — Z8719 Personal history of other diseases of the digestive system: Secondary | ICD-10-CM | POA: Diagnosis not present

## 2021-07-30 DIAGNOSIS — J449 Chronic obstructive pulmonary disease, unspecified: Secondary | ICD-10-CM | POA: Diagnosis not present

## 2021-07-30 DIAGNOSIS — E440 Moderate protein-calorie malnutrition: Secondary | ICD-10-CM | POA: Diagnosis not present

## 2021-07-30 DIAGNOSIS — D469 Myelodysplastic syndrome, unspecified: Secondary | ICD-10-CM | POA: Diagnosis not present

## 2021-07-30 DIAGNOSIS — Z7952 Long term (current) use of systemic steroids: Secondary | ICD-10-CM | POA: Diagnosis not present

## 2021-07-30 DIAGNOSIS — Z9181 History of falling: Secondary | ICD-10-CM | POA: Diagnosis not present

## 2021-07-30 DIAGNOSIS — D696 Thrombocytopenia, unspecified: Secondary | ICD-10-CM | POA: Diagnosis not present

## 2021-07-30 DIAGNOSIS — M1990 Unspecified osteoarthritis, unspecified site: Secondary | ICD-10-CM | POA: Diagnosis not present

## 2021-07-30 DIAGNOSIS — Z9981 Dependence on supplemental oxygen: Secondary | ICD-10-CM | POA: Diagnosis not present

## 2021-07-30 DIAGNOSIS — N1832 Chronic kidney disease, stage 3b: Secondary | ICD-10-CM | POA: Diagnosis not present

## 2021-07-31 ENCOUNTER — Other Ambulatory Visit: Payer: Self-pay

## 2021-07-31 ENCOUNTER — Ambulatory Visit (INDEPENDENT_AMBULATORY_CARE_PROVIDER_SITE_OTHER): Payer: Medicare Other | Admitting: Gastroenterology

## 2021-07-31 ENCOUNTER — Inpatient Hospital Stay (HOSPITAL_COMMUNITY): Payer: Medicare Other

## 2021-07-31 ENCOUNTER — Ambulatory Visit (HOSPITAL_COMMUNITY): Payer: Medicare Other

## 2021-07-31 ENCOUNTER — Other Ambulatory Visit (HOSPITAL_COMMUNITY): Payer: Self-pay | Admitting: *Deleted

## 2021-07-31 DIAGNOSIS — D61818 Other pancytopenia: Secondary | ICD-10-CM | POA: Diagnosis not present

## 2021-07-31 DIAGNOSIS — J449 Chronic obstructive pulmonary disease, unspecified: Secondary | ICD-10-CM | POA: Diagnosis not present

## 2021-07-31 DIAGNOSIS — D469 Myelodysplastic syndrome, unspecified: Secondary | ICD-10-CM | POA: Diagnosis not present

## 2021-07-31 DIAGNOSIS — N1832 Chronic kidney disease, stage 3b: Secondary | ICD-10-CM | POA: Diagnosis not present

## 2021-07-31 DIAGNOSIS — Z87891 Personal history of nicotine dependence: Secondary | ICD-10-CM | POA: Diagnosis not present

## 2021-07-31 DIAGNOSIS — Z79899 Other long term (current) drug therapy: Secondary | ICD-10-CM | POA: Diagnosis not present

## 2021-07-31 LAB — CBC
HCT: 28 % — ABNORMAL LOW (ref 39.0–52.0)
Hemoglobin: 8.7 g/dL — ABNORMAL LOW (ref 13.0–17.0)
MCH: 27.2 pg (ref 26.0–34.0)
MCHC: 31.1 g/dL (ref 30.0–36.0)
MCV: 87.5 fL (ref 80.0–100.0)
Platelets: 60 10*3/uL — ABNORMAL LOW (ref 150–400)
RBC: 3.2 MIL/uL — ABNORMAL LOW (ref 4.22–5.81)
RDW: 16.6 % — ABNORMAL HIGH (ref 11.5–15.5)
WBC: 10.4 10*3/uL (ref 4.0–10.5)
nRBC: 0.6 % — ABNORMAL HIGH (ref 0.0–0.2)

## 2021-07-31 LAB — SAMPLE TO BLOOD BANK

## 2021-07-31 MED ORDER — HEPARIN SOD (PORK) LOCK FLUSH 100 UNIT/ML IV SOLN
500.0000 [IU] | Freq: Once | INTRAVENOUS | Status: AC
  Administered 2021-07-31: 500 [IU] via INTRAVENOUS

## 2021-07-31 MED ORDER — MEGESTROL ACETATE 400 MG/10ML PO SUSP: 400.0000 mg | Freq: Two times a day (BID) | ORAL | 4 refills | Status: DC

## 2021-07-31 MED ORDER — SODIUM CHLORIDE 0.9% FLUSH
10.0000 mL | INTRAVENOUS | Status: DC | PRN
  Administered 2021-07-31: 10 mL via INTRAVENOUS

## 2021-07-31 NOTE — Progress Notes (Signed)
HGB 8.7. No blood products needed today. Patient asymptomatic with no complaints today. Discharged from clinic by wheel chair in stable condition. Alert and oriented x 3. F/U with [facility] as scheduled.  ? ?Called mobile and left message with patient pertaining to Megace 400 mg prescription sent to his [employer].  ?

## 2021-08-01 ENCOUNTER — Ambulatory Visit (HOSPITAL_COMMUNITY): Payer: Medicare Other

## 2021-08-01 NOTE — Telephone Encounter (Signed)
Patient cancelled the appointment on 07/31/2021. He is rescheduled to May 2023. I can not tell if patient is still in the hospital. Do we need to do anything? Looks like patient followed for anemia by Dr.Katragatta and on Aranesp. ?

## 2021-08-04 ENCOUNTER — Inpatient Hospital Stay (HOSPITAL_COMMUNITY): Payer: Medicare Other

## 2021-08-04 ENCOUNTER — Ambulatory Visit (HOSPITAL_COMMUNITY): Payer: Medicare Other | Admitting: Hematology

## 2021-08-04 ENCOUNTER — Other Ambulatory Visit: Payer: Self-pay

## 2021-08-04 DIAGNOSIS — D469 Myelodysplastic syndrome, unspecified: Secondary | ICD-10-CM | POA: Diagnosis not present

## 2021-08-04 DIAGNOSIS — Z87891 Personal history of nicotine dependence: Secondary | ICD-10-CM | POA: Diagnosis not present

## 2021-08-04 DIAGNOSIS — Z79899 Other long term (current) drug therapy: Secondary | ICD-10-CM | POA: Diagnosis not present

## 2021-08-04 DIAGNOSIS — D61818 Other pancytopenia: Secondary | ICD-10-CM | POA: Diagnosis not present

## 2021-08-04 DIAGNOSIS — N1832 Chronic kidney disease, stage 3b: Secondary | ICD-10-CM | POA: Diagnosis not present

## 2021-08-04 DIAGNOSIS — J449 Chronic obstructive pulmonary disease, unspecified: Secondary | ICD-10-CM | POA: Diagnosis not present

## 2021-08-04 DIAGNOSIS — D649 Anemia, unspecified: Secondary | ICD-10-CM

## 2021-08-04 LAB — SAMPLE TO BLOOD BANK

## 2021-08-04 LAB — CBC
HCT: 26.2 % — ABNORMAL LOW (ref 39.0–52.0)
Hemoglobin: 8.3 g/dL — ABNORMAL LOW (ref 13.0–17.0)
MCH: 28.2 pg (ref 26.0–34.0)
MCHC: 31.7 g/dL (ref 30.0–36.0)
MCV: 89.1 fL (ref 80.0–100.0)
Platelets: 66 10*3/uL — ABNORMAL LOW (ref 150–400)
RBC: 2.94 MIL/uL — ABNORMAL LOW (ref 4.22–5.81)
RDW: 17.4 % — ABNORMAL HIGH (ref 11.5–15.5)
WBC: 13.5 10*3/uL — ABNORMAL HIGH (ref 4.0–10.5)
nRBC: 1.5 % — ABNORMAL HIGH (ref 0.0–0.2)

## 2021-08-04 MED ORDER — SODIUM CHLORIDE 0.9% FLUSH
10.0000 mL | INTRAVENOUS | Status: DC | PRN
  Administered 2021-08-04: 10 mL via INTRAVENOUS

## 2021-08-04 MED ORDER — HEPARIN SOD (PORK) LOCK FLUSH 100 UNIT/ML IV SOLN
500.0000 [IU] | Freq: Once | INTRAVENOUS | Status: AC
  Administered 2021-08-04: 500 [IU] via INTRAVENOUS

## 2021-08-04 NOTE — Telephone Encounter (Signed)
Noted  

## 2021-08-04 NOTE — Progress Notes (Signed)
Patient presents today for blood transfusion.  Patient is in satisfactory condition with no complaints voiced.  Vital signs are stable.   ? ?Hemoglobin today is 8.3.  We will hold blood products today per Dr. Delton Coombes.  Patient left via wheelchair with daughter in stable condition.   ?

## 2021-08-05 DIAGNOSIS — D500 Iron deficiency anemia secondary to blood loss (chronic): Secondary | ICD-10-CM | POA: Diagnosis not present

## 2021-08-05 DIAGNOSIS — D469 Myelodysplastic syndrome, unspecified: Secondary | ICD-10-CM | POA: Diagnosis not present

## 2021-08-05 DIAGNOSIS — J9621 Acute and chronic respiratory failure with hypoxia: Secondary | ICD-10-CM | POA: Diagnosis not present

## 2021-08-05 DIAGNOSIS — E440 Moderate protein-calorie malnutrition: Secondary | ICD-10-CM | POA: Diagnosis not present

## 2021-08-05 DIAGNOSIS — J449 Chronic obstructive pulmonary disease, unspecified: Secondary | ICD-10-CM | POA: Diagnosis not present

## 2021-08-05 DIAGNOSIS — D696 Thrombocytopenia, unspecified: Secondary | ICD-10-CM | POA: Diagnosis not present

## 2021-08-06 DIAGNOSIS — D500 Iron deficiency anemia secondary to blood loss (chronic): Secondary | ICD-10-CM | POA: Diagnosis not present

## 2021-08-06 DIAGNOSIS — J449 Chronic obstructive pulmonary disease, unspecified: Secondary | ICD-10-CM | POA: Diagnosis not present

## 2021-08-06 DIAGNOSIS — E440 Moderate protein-calorie malnutrition: Secondary | ICD-10-CM | POA: Diagnosis not present

## 2021-08-06 DIAGNOSIS — J9621 Acute and chronic respiratory failure with hypoxia: Secondary | ICD-10-CM | POA: Diagnosis not present

## 2021-08-06 DIAGNOSIS — D696 Thrombocytopenia, unspecified: Secondary | ICD-10-CM | POA: Diagnosis not present

## 2021-08-06 DIAGNOSIS — D469 Myelodysplastic syndrome, unspecified: Secondary | ICD-10-CM | POA: Diagnosis not present

## 2021-08-07 ENCOUNTER — Ambulatory Visit (HOSPITAL_BASED_OUTPATIENT_CLINIC_OR_DEPARTMENT_OTHER)
Admission: RE | Admit: 2021-08-07 | Discharge: 2021-08-07 | Disposition: A | Discharge status: Home / Self Care | Payer: Medicare Other | Source: Ambulatory Visit | Attending: General Surgery | Admitting: General Surgery

## 2021-08-07 ENCOUNTER — Other Ambulatory Visit (HOSPITAL_COMMUNITY): Payer: Self-pay

## 2021-08-07 ENCOUNTER — Other Ambulatory Visit: Payer: Self-pay

## 2021-08-07 ENCOUNTER — Encounter (HOSPITAL_COMMUNITY): Payer: Self-pay | Admitting: Hematology

## 2021-08-07 ENCOUNTER — Inpatient Hospital Stay (HOSPITAL_COMMUNITY): Payer: Medicare Other

## 2021-08-07 ENCOUNTER — Ambulatory Visit (HOSPITAL_COMMUNITY): Payer: Medicare Other

## 2021-08-07 ENCOUNTER — Encounter (HOSPITAL_COMMUNITY)
Admission: RE | Disposition: A | Discharge status: Home / Self Care | Payer: Self-pay | Source: Ambulatory Visit | Attending: General Surgery

## 2021-08-07 ENCOUNTER — Other Ambulatory Visit (INDEPENDENT_AMBULATORY_CARE_PROVIDER_SITE_OTHER): Payer: Medicare Other | Admitting: General Surgery

## 2021-08-07 ENCOUNTER — Other Ambulatory Visit (HOSPITAL_COMMUNITY): Payer: Self-pay | Admitting: Physician Assistant

## 2021-08-07 DIAGNOSIS — Z79899 Other long term (current) drug therapy: Secondary | ICD-10-CM | POA: Diagnosis not present

## 2021-08-07 DIAGNOSIS — T80212A Local infection due to central venous catheter, initial encounter: Secondary | ICD-10-CM

## 2021-08-07 DIAGNOSIS — R6520 Severe sepsis without septic shock: Secondary | ICD-10-CM | POA: Diagnosis not present

## 2021-08-07 DIAGNOSIS — R0902 Hypoxemia: Secondary | ICD-10-CM | POA: Diagnosis not present

## 2021-08-07 DIAGNOSIS — Z66 Do not resuscitate: Secondary | ICD-10-CM | POA: Diagnosis present

## 2021-08-07 DIAGNOSIS — R0602 Shortness of breath: Secondary | ICD-10-CM | POA: Diagnosis not present

## 2021-08-07 DIAGNOSIS — R58 Hemorrhage, not elsewhere classified: Secondary | ICD-10-CM | POA: Diagnosis not present

## 2021-08-07 DIAGNOSIS — E860 Dehydration: Secondary | ICD-10-CM | POA: Diagnosis present

## 2021-08-07 DIAGNOSIS — K219 Gastro-esophageal reflux disease without esophagitis: Secondary | ICD-10-CM | POA: Diagnosis present

## 2021-08-07 DIAGNOSIS — D469 Myelodysplastic syndrome, unspecified: Secondary | ICD-10-CM

## 2021-08-07 DIAGNOSIS — T82838A Hemorrhage of vascular prosthetic devices, implants and grafts, initial encounter: Secondary | ICD-10-CM | POA: Diagnosis present

## 2021-08-07 DIAGNOSIS — Z7952 Long term (current) use of systemic steroids: Secondary | ICD-10-CM | POA: Diagnosis not present

## 2021-08-07 DIAGNOSIS — Y838 Other surgical procedures as the cause of abnormal reaction of the patient, or of later complication, without mention of misadventure at the time of the procedure: Secondary | ICD-10-CM | POA: Diagnosis present

## 2021-08-07 DIAGNOSIS — T80219A Unspecified infection due to central venous catheter, initial encounter: Secondary | ICD-10-CM | POA: Diagnosis not present

## 2021-08-07 DIAGNOSIS — Z961 Presence of intraocular lens: Secondary | ICD-10-CM | POA: Diagnosis present

## 2021-08-07 DIAGNOSIS — J449 Chronic obstructive pulmonary disease, unspecified: Secondary | ICD-10-CM | POA: Diagnosis not present

## 2021-08-07 DIAGNOSIS — E872 Acidosis, unspecified: Secondary | ICD-10-CM | POA: Diagnosis present

## 2021-08-07 DIAGNOSIS — N1831 Chronic kidney disease, stage 3a: Secondary | ICD-10-CM | POA: Diagnosis present

## 2021-08-07 DIAGNOSIS — Y828 Other medical devices associated with adverse incidents: Secondary | ICD-10-CM | POA: Insufficient documentation

## 2021-08-07 DIAGNOSIS — D500 Iron deficiency anemia secondary to blood loss (chronic): Secondary | ICD-10-CM | POA: Diagnosis not present

## 2021-08-07 DIAGNOSIS — R64 Cachexia: Secondary | ICD-10-CM | POA: Diagnosis present

## 2021-08-07 DIAGNOSIS — R42 Dizziness and giddiness: Secondary | ICD-10-CM | POA: Diagnosis not present

## 2021-08-07 DIAGNOSIS — J9621 Acute and chronic respiratory failure with hypoxia: Secondary | ICD-10-CM | POA: Diagnosis not present

## 2021-08-07 DIAGNOSIS — R001 Bradycardia, unspecified: Secondary | ICD-10-CM | POA: Diagnosis not present

## 2021-08-07 DIAGNOSIS — Z841 Family history of disorders of kidney and ureter: Secondary | ICD-10-CM | POA: Diagnosis not present

## 2021-08-07 DIAGNOSIS — J439 Emphysema, unspecified: Secondary | ICD-10-CM | POA: Diagnosis not present

## 2021-08-07 DIAGNOSIS — Z9981 Dependence on supplemental oxygen: Secondary | ICD-10-CM | POA: Diagnosis not present

## 2021-08-07 DIAGNOSIS — N179 Acute kidney failure, unspecified: Secondary | ICD-10-CM | POA: Diagnosis present

## 2021-08-07 DIAGNOSIS — L7622 Postprocedural hemorrhage and hematoma of skin and subcutaneous tissue following other procedure: Secondary | ICD-10-CM | POA: Diagnosis present

## 2021-08-07 DIAGNOSIS — T80218A Other infection due to central venous catheter, initial encounter: Secondary | ICD-10-CM | POA: Diagnosis present

## 2021-08-07 DIAGNOSIS — Z87891 Personal history of nicotine dependence: Secondary | ICD-10-CM | POA: Diagnosis not present

## 2021-08-07 DIAGNOSIS — X58XXXA Exposure to other specified factors, initial encounter: Secondary | ICD-10-CM | POA: Insufficient documentation

## 2021-08-07 DIAGNOSIS — D696 Thrombocytopenia, unspecified: Secondary | ICD-10-CM | POA: Diagnosis present

## 2021-08-07 DIAGNOSIS — Z9842 Cataract extraction status, left eye: Secondary | ICD-10-CM | POA: Diagnosis not present

## 2021-08-07 DIAGNOSIS — A419 Sepsis, unspecified organism: Secondary | ICD-10-CM | POA: Diagnosis not present

## 2021-08-07 DIAGNOSIS — Z681 Body mass index (BMI) 19 or less, adult: Secondary | ICD-10-CM | POA: Diagnosis not present

## 2021-08-07 DIAGNOSIS — E440 Moderate protein-calorie malnutrition: Secondary | ICD-10-CM | POA: Diagnosis present

## 2021-08-07 DIAGNOSIS — D649 Anemia, unspecified: Secondary | ICD-10-CM

## 2021-08-07 DIAGNOSIS — I959 Hypotension, unspecified: Secondary | ICD-10-CM | POA: Diagnosis not present

## 2021-08-07 DIAGNOSIS — Z9841 Cataract extraction status, right eye: Secondary | ICD-10-CM | POA: Diagnosis not present

## 2021-08-07 HISTORY — PX: PORT-A-CATH REMOVAL: SHX5289

## 2021-08-07 LAB — DIFFERENTIAL
Band Neutrophils: 2 %
Basophils Relative: 0 %
Blasts: 16 %
Eosinophils Relative: 0 %
Lymphocytes Relative: 27 %
Metamyelocytes Relative: 3 %
Monocytes Relative: 2 %
Myelocytes: 2 %
Neutrophils Relative %: 47 %
Promyelocytes Relative: 1 %
nRBC: 1 /100 WBC — ABNORMAL HIGH

## 2021-08-07 LAB — CBC
HCT: 28.4 % — ABNORMAL LOW (ref 39.0–52.0)
Hemoglobin: 8.9 g/dL — ABNORMAL LOW (ref 13.0–17.0)
MCH: 27.4 pg (ref 26.0–34.0)
MCHC: 31.3 g/dL (ref 30.0–36.0)
MCV: 87.4 fL (ref 80.0–100.0)
Platelets: 47 10*3/uL — ABNORMAL LOW (ref 150–400)
RBC: 3.25 MIL/uL — ABNORMAL LOW (ref 4.22–5.81)
RDW: 17.8 % — ABNORMAL HIGH (ref 11.5–15.5)
WBC: 13.3 10*3/uL — ABNORMAL HIGH (ref 4.0–10.5)
nRBC: 1.3 % — ABNORMAL HIGH (ref 0.0–0.2)

## 2021-08-07 LAB — SAMPLE TO BLOOD BANK

## 2021-08-07 SURGERY — MINOR REMOVAL PORT-A-CATH
Anesthesia: LOCAL | Laterality: Left

## 2021-08-07 MED ORDER — CHLORHEXIDINE GLUCONATE 4 % EX LIQD: 1.0000 "application " | Freq: Once | CUTANEOUS | Status: DC

## 2021-08-07 MED ORDER — LIDOCAINE HCL (PF) 1 % IJ SOLN
INTRAMUSCULAR | Status: DC | PRN
  Administered 2021-08-07: 10 mL

## 2021-08-07 MED ORDER — AMOXICILLIN-POT CLAVULANATE 875-125 MG PO TABS: 1.0000 | ORAL_TABLET | Freq: Two times a day (BID) | ORAL | 0 refills | Status: DC

## 2021-08-07 SURGICAL SUPPLY — 15 items
ADH SKN CLS APL DERMABOND .7 (GAUZE/BANDAGES/DRESSINGS) ×1
APL PRP STRL LF ISPRP CHG 10.5 (MISCELLANEOUS)
APPLICATOR CHLORAPREP 10.5 ORG (MISCELLANEOUS) IMPLANT
CLOTH BEACON ORANGE TIMEOUT ST (SAFETY) ×2 IMPLANT
DECANTER SPIKE VIAL GLASS SM (MISCELLANEOUS) ×2 IMPLANT
DERMABOND ADVANCED (GAUZE/BANDAGES/DRESSINGS) ×1
DERMABOND ADVANCED .7 DNX12 (GAUZE/BANDAGES/DRESSINGS) ×1 IMPLANT
DRAPE HALF SHEET 40X57 (DRAPES) IMPLANT
GLOVE SURG ENC MOIS LTX SZ6.5 (GLOVE) IMPLANT
GLOVE SURG UNDER POLY LF SZ7 (GLOVE) IMPLANT
GOWN STRL REUS W/TWL LRG LVL3 (GOWN DISPOSABLE) IMPLANT
SPONGE GAUZE 2X2 8PLY STRL LF (GAUZE/BANDAGES/DRESSINGS) ×2 IMPLANT
SUT MNCRL AB 4-0 PS2 18 (SUTURE) ×2 IMPLANT
SUT VIC AB 3-0 SH 27 (SUTURE) ×2
SUT VIC AB 3-0 SH 27X BRD (SUTURE) ×1 IMPLANT

## 2021-08-07 NOTE — H&P (Signed)
[name] History and Physical ?  ?Reason for Referral: Infected port  ?Referring Physician: Tarri Abernethy, [place]  ?  ?  ?  ?BLANCHARD WILLHITE is a 88 y.o. male.  ?HPI: Mr Guterrez is a 88 yo with MDS that has a port I placed in 2022. This looks infected today at his possible transfusion. H&H is good, and I saw the patient in the cancer center. We will plan to remove his port today. This is red and swollen and is hurting him. It is not draining. It is red.  ?  ?    ?Past Medical History:  ?Diagnosis Date  ? Bone cancer ([place])    ? Chronic kidney insufficiency    ? COPD (chronic obstructive pulmonary disease) ([place])    ? GERD (gastroesophageal reflux disease)    ? Pulmonary nodule    ?  ?  ?     ?Past Surgical History:  ?Procedure Laterality Date  ? BACK SURGERY      ? BIOPSY   07/15/2021  ?  Procedure: BIOPSY;  Surgeon: Harvel Quale, MD;  [facility];  Service: Gastroenterology;;  ? CATARACT EXTRACTION W/PHACO Right 03/09/2016  ?  Procedure: CATARACT EXTRACTION PHACO AND INTRAOCULAR LENS PLACEMENT RIGHT EYE CDE=8.58;  Surgeon: Tonny Branch, MD;  Location: [facility];  Service: Ophthalmology;  Laterality: Right;  right  ? CATARACT EXTRACTION W/PHACO Left 04/13/2016  ?  Procedure: CATARACT EXTRACTION PHACO AND INTRAOCULAR LENS PLACEMENT (IOC);  Surgeon: Tonny Branch, MD;  Location: [facility];  Service: Ophthalmology;  Laterality: Left;  CDE: 8.08  ? ENTEROSCOPY   07/23/2021  ?  Procedure: ENTEROSCOPY;  Surgeon: Harvel Quale, MD;  [facility];  Service: Gastroenterology;;  ? ESOPHAGOGASTRODUODENOSCOPY (EGD) WITH PROPOFOL N/A 07/14/2021  ?  Procedure: ESOPHAGOGASTRODUODENOSCOPY (EGD) WITH PROPOFOL;  Surgeon: Eloise Harman, DO;  [facility];  Service: Endoscopy;  Laterality: N/A;  ? ESOPHAGOGASTRODUODENOSCOPY (EGD) WITH PROPOFOL N/A 07/15/2021  ?  Procedure: ESOPHAGOGASTRODUODENOSCOPY (EGD) WITH PROPOFOL;  Surgeon: Harvel Quale, MD;  [facility];  Service: Gastroenterology;  Laterality: N/A;  ? ESOPHAGOGASTRODUODENOSCOPY (EGD) WITH PROPOFOL N/A 07/23/2021  ?  Procedure: ESOPHAGOGASTRODUODENOSCOPY (EGD) WITH PROPOFOL;  Surgeon: Harvel Quale, MD;  [facility];  Service: Gastroenterology;  Laterality: N/A;  ? HEMOSTASIS CLIP PLACEMENT   07/15/2021  ?  Procedure: HEMOSTASIS CLIP PLACEMENT;  Surgeon: Harvel Quale, MD;  [facility];  Service: Gastroenterology;;  ? HEMOSTASIS CLIP PLACEMENT   07/23/2021  ?  Procedure: HEMOSTASIS CLIP PLACEMENT;  Surgeon: Harvel Quale, MD;  [facility];  Service: Gastroenterology;;  ? KYPHOPLASTY Bilateral 06/09/2019  ?  Procedure: T11 KYPHOPLASTY;  Surgeon: Consuella Lose, MD;  Location: [place];  Service: Neurosurgery;  Laterality: Bilateral;  ? None to Date   10/01/15  ? PORTACATH PLACEMENT Left 10/11/2020  ?  Procedure: INSERTION PORT-A-CATH (attached catheter in left internal jugular);  Surgeon: Virl Cagey, MD;  Location: [facility];  Service: General;  Laterality: Left;  ?  ?  ?     ?Family History  ?Problem Relation Age of Onset  ? Kidney disease Mother    ? Kidney disease Sister    ? Colon cancer Neg Hx    ? Gastric cancer Neg Hx    ? Esophageal cancer Neg Hx    ?  ?  ?Social History  ?  ?     ?Tobacco Use  ?  Smoking status: Former  ?    Years: 15.00  ?    Types: Cigarettes, Cigars  ?    Quit date: 10/01/1990  ?    Years since quitting: 30.8  ? Smokeless tobacco: Never  ? Tobacco comments:  ?    Quit x 25-30 years; 2 cigars daily when smoked  ?Vaping Use  ? Vaping Use: Never used  ?Substance Use Topics  ? Alcohol use: Not Currently  ?    Alcohol/week: 0.0 standard drinks  ? Drug use: No  ?  ?  ?Medications: I have reviewed the patient's current medications. ?  ?Allergies as of 08/07/2021   ?No Known Allergies ?   ?  ?   ?Medication List  ?   ?  ?   ? Accurate as of August 07, 2021  9:47 AM. If you have any questions,  ask your nurse or doctor.  ?  ?   ?  ?   ?  ?albuterol 108 (90 Base) MCG/ACT inhaler ?Commonly known as: VENTOLIN HFA ?Inhale 2 puffs into the lungs every 4 (four) hours as needed for wheezing or shortness of breath. ?   ?megestrol 400 MG/10ML suspension ?Commonly known as: MEGACE ?Take 10 mLs (400 mg total) by mouth 2 (two) times daily. ?   ?midodrine 2.5 MG tablet ?Commonly known as: PROAMATINE ?Take 1 tablet (2.5 mg total) by mouth 3 (three) times daily with meals. ?   ?pantoprazole 40 MG tablet ?Commonly known as: Protonix ?Take 1 tablet (40 mg total) by mouth 2 (two) times daily. ?   ?predniSONE 5 MG tablet ?Commonly known as: DELTASONE ?Take 5 mg by mouth daily with breakfast. ?   ?silodosin 4 MG Caps capsule ?Commonly known as: RAPAFLO ?Take 4 mg by mouth at bedtime. ?   ?  ?   ?  ?  ?  ?ROS:  ?A comprehensive review of systems was negative except for: skin over port red and swollen  ?  ?VS 807AM BP 90/65/ HR 94, RR 20., Temp 98.2, O2 98% RA ?Physical Exam ?Vitals reviewed.  ?Constitutional:   ?   Appearance: Normal appearance.  ?HENT:  ?   Head: Normocephalic.  ?   Mouth/Throat:  ?   Mouth: Mucous membranes are moist.  ?Eyes:  ?   Extraocular Movements: Extraocular movements intact.  ?Neck:  ?   Comments: Redness and swelling over the port on the left chest  ?Cardiovascular:  ?   Rate and Rhythm: Normal rate and regular rhythm.  ?Pulmonary:  ?   Effort: Pulmonary effort is normal.  ?Abdominal:  ?   Palpations: Abdomen is soft.  ?Musculoskeletal:     ?   General: Normal range of motion.  ?Skin: ?   General: Skin is warm.  ?Neurological:  ?   General: No focal deficit present.  ?   Mental Status: He is alert.  ?  ?  ?Results: ?Lab Results Last 48 Hours  ?      ?Results for orders placed or performed in visit on 08/07/21 (from the past 48 hour(s))  ?CBC     Status: Abnormal  ?  Collection Time: 08/07/21  8:03 AM  ?Result Value Ref Range  ?  WBC 13.3 (H) 4.0 - 10.5 K/uL  ?  RBC 3.25 (L) 4.22 - 5.81 MIL/uL  ?   Hemoglobin 8.9 (L) 13.0 - 17.0 g/dL  ?  HCT 28.4 (L) 39.0 - 52.0 %  ?  MCV 87.4 80.0 - 100.0 fL  ?  MCH 27.4 26.0 - 34.0 pg  ?  MCHC 31.3 30.0 - 36.0 g/dL  ?  RDW 17.8 (H) 11.5 - 15.5 %  ?  Platelets 47 (L) 150 - 400 K/uL  ?    Comment: SPECIMEN CHECKED FOR CLOTS ?Immature Platelet Fraction may be ?clinically indicated, consider ?ordering this additional test ?[id number] ?CONSISTENT WITH PREVIOUS RESULT ?PLATELET COUNT CONFIRMED BY SMEAR ?   ?  nRBC 1.3 (H) 0.0 - 0.2 %  ?    Comment: Performed at [facility], [address] [place], [place] [address]  ?  ?  ?  ?  ?  ?Assessment & Plan:  ?DAYRON ODLAND is a 88 y.o. male with infected port. Will remove in the PACU minor room later today.  ?-Discussed risk of bleeding, infection, incomplete removal ?-Antibiotics after and packing ?-Will get labs and cultures from Oncology  ?  ?  ?All questions were answered to the satisfaction of the patient. ?  ?  ?  ?Virl Cagey ?08/07/2021, 9:47 AM  ?  ?

## 2021-08-07 NOTE — Progress Notes (Signed)
[facility] ? ?CXR ordered. Port removed. Augmentin sent in. Packing daily. Supplies given.  ? ? ?Future Appointments  ?Date Time Provider [place]  ?08/11/2021  7:40 AM AP-ACAPA NURSE AP-ACAPA None  ?08/11/2021  8:15 AM AP-ACAPA CHAIR 1 AP-ACAPA None  ?08/12/2021 10:15 AM Virl Cagey, MD RS-RS None  ?08/14/2021  8:30 AM AP-ACAPA NURSE AP-ACAPA None  ?08/14/2021  9:45 AM Derek Jack, MD AP-ACAPA None  ?08/14/2021 10:15 AM AP-ACAPA CHAIR 1 AP-ACAPA None  ?08/29/2021  2:00 PM Wert, Christena Deem, MD LBPU-RDS None  ?09/15/2021 10:45 AM Harvel Quale, MD NRE-NRE None  ? ? ?Will see next week. ? ?[name], MD ?[facility] ?[place], [place] [address] ?[phone] (office) ? ? ?. ?

## 2021-08-07 NOTE — Progress Notes (Signed)
[name] History and Physical ? ?Reason for Referral: Infected port  ?Referring Physician: Tarri Abernethy, [place]  ? ? ? ?[name] is a 88 y.o. male.  ?HPI: Mr [name] is a 88 yo with MDS that has a port I placed in 2022. This looks infected today at his possible transfusion. H&H is good, and I saw the patient in the cancer center. We will plan to remove his port today. This is red and swollen and is hurting him. It is not draining. It is red.  ? ?Past Medical History:  ?Diagnosis Date  ? Bone cancer ([place])   ? Chronic kidney insufficiency   ? COPD (chronic obstructive pulmonary disease) ([place])   ? GERD (gastroesophageal reflux disease)   ? Pulmonary nodule   ? ? ?Past Surgical History:  ?Procedure Laterality Date  ? BACK SURGERY    ? BIOPSY  07/15/2021  ? Procedure: BIOPSY;  Surgeon: Harvel Quale, MD;  [facility];  Service: Gastroenterology;;  ? CATARACT EXTRACTION W/PHACO Right 03/09/2016  ? Procedure: CATARACT EXTRACTION PHACO AND INTRAOCULAR LENS PLACEMENT RIGHT EYE CDE=8.58;  Surgeon: Tonny Branch, MD;  Location: [facility] ORS;  Service: Ophthalmology;  Laterality: Right;  right  ? CATARACT EXTRACTION W/PHACO Left 04/13/2016  ? Procedure: CATARACT EXTRACTION PHACO AND INTRAOCULAR LENS PLACEMENT (IOC);  Surgeon: Tonny Branch, MD;  Location: [facility] ORS;  Service: Ophthalmology;  Laterality: Left;  CDE: 8.08  ? ENTEROSCOPY  07/23/2021  ? Procedure: ENTEROSCOPY;  Surgeon: Harvel Quale, MD;  Location: [facility];  Service: Gastroenterology;;  ? ESOPHAGOGASTRODUODENOSCOPY (EGD) WITH PROPOFOL N/A 07/14/2021  ? Procedure: ESOPHAGOGASTRODUODENOSCOPY (EGD) WITH PROPOFOL;  Surgeon: Eloise Harman, DO;  Location: [facility];  Service: Endoscopy;  Laterality: N/A;  ? ESOPHAGOGASTRODUODENOSCOPY (EGD) WITH PROPOFOL N/A 07/15/2021  ? Procedure: ESOPHAGOGASTRODUODENOSCOPY (EGD) WITH PROPOFOL;  Surgeon: Harvel Quale, MD;  [facility];   Service: Gastroenterology;  Laterality: N/A;  ? ESOPHAGOGASTRODUODENOSCOPY (EGD) WITH PROPOFOL N/A 07/23/2021  ? Procedure: ESOPHAGOGASTRODUODENOSCOPY (EGD) WITH PROPOFOL;  Surgeon: Harvel Quale, MD;  Location[facility];  Service: Gastroenterology;  Laterality: N/A;  ? HEMOSTASIS CLIP PLACEMENT  07/15/2021  ? Procedure: HEMOSTASIS CLIP PLACEMENT;  Surgeon: Harvel Quale, MD;  Location: [facility];  Service: Gastroenterology;;  ? HEMOSTASIS CLIP PLACEMENT  07/23/2021  ? Procedure: HEMOSTASIS CLIP PLACEMENT;  Surgeon: Harvel Quale, MD;  Location: [facility];  Service: Gastroenterology;;  ? KYPHOPLASTY Bilateral 06/09/2019  ? Procedure: T11 KYPHOPLASTY;  Surgeon: Consuella Lose, MD;  Location: [place];  Service: Neurosurgery;  Laterality: Bilateral;  ? None to Date  10/01/15  ? PORTACATH PLACEMENT Left 10/11/2020  ? Procedure: INSERTION PORT-A-CATH (attached catheter in left internal jugular);  Surgeon: Thomas Cagey, MD;  Location: [facility] OR[facility];  Service: General;  Laterality: Left;  ? ? ?Family History  ?Problem Relation Age of Onset  ? Kidney disease Mother   ? Kidney disease Sister   ? Colon cancer Neg Hx   ? Gastric cancer Neg Hx   ? Esophageal cancer Neg Hx   ? ? ?Social History  ? ?Tobacco Use  ? Smoking status: Former  ?  Years: 15.00  ?  Types: Cigarettes, Cigars  ?  Quit date: 10/01/1990  ?  Years since quitting: 30.8  ? Smokeless tobacco: Never  ? Tobacco comments:  ?  Quit x 25-30 years; 2 cigars daily when smoked  ?Vaping Use  ? Vaping Use: Never used  ?Substance  Use Topics  ? Alcohol use: Not Currently  ?  Alcohol/week: 0.0 standard drinks  ? Drug use: No  ? ? ?Medications: I have reviewed the patient's current medications. ? ?Allergies as of 08/07/2021   ?No Known Allergies ?  ? ?  ?Medication List  ?  ? ?  ? Accurate as of August 07, 2021  9:47 AM. If you have any questions, ask your nurse or doctor.  ?  ?  ? ?  ? ?albuterol 108 (90 Base) MCG/ACT inhaler ?Commonly  known as: VENTOLIN HFA ?Inhale 2 puffs into the lungs every 4 (four) hours as needed for wheezing or shortness of breath. ?  ?megestrol 400 MG/10ML suspension ?Commonly known as: MEGACE ?Take 10 mLs (400 mg total) by mouth 2 (two) times daily. ?  ?midodrine 2.5 MG tablet ?Commonly known as: PROAMATINE ?Take 1 tablet (2.5 mg total) by mouth 3 (three) times daily with meals. ?  ?pantoprazole 40 MG tablet ?Commonly known as: Protonix ?Take 1 tablet (40 mg total) by mouth 2 (two) times daily. ?  ?predniSONE 5 MG tablet ?Commonly known as: DELTASONE ?Take 5 mg by mouth daily with breakfast. ?  ?silodosin 4 MG Caps capsule ?Commonly known as: RAPAFLO ?Take 4 mg by mouth at bedtime. ?  ? ?  ? ? ? ?ROS:  ?A comprehensive review of systems was negative except for: skin over port red and swollen  ? ?VS 807AM BP 90/65/ HR 94, RR 20., Temp 98.2, O2 98% RA ?Physical Exam ?Vitals reviewed.  ?Constitutional:   ?   Appearance: Normal appearance.  ?HENT:  ?   Head: Normocephalic.  ?   Mouth/Throat:  ?   Mouth: Mucous membranes are moist.  ?Eyes:  ?   Extraocular Movements: Extraocular movements intact.  ?Neck:  ?   Comments: Redness and swelling over the port on the left chest  ?Cardiovascular:  ?   Rate and Rhythm: Normal rate and regular rhythm.  ?Pulmonary:  ?   Effort: Pulmonary effort is normal.  ?Abdominal:  ?   Palpations: Abdomen is soft.  ?Musculoskeletal:     ?   General: Normal range of motion.  ?Skin: ?   General: Skin is warm.  ?Neurological:  ?   General: No focal deficit present.  ?   Mental Status: He is alert.  ? ? ? ?Results: ?Results for orders placed or performed in visit on 08/07/21 (from the past 48 hour(s))  ?CBC     Status: Abnormal  ? Collection Time: 08/07/21  8:03 AM  ?Result Value Ref Range  ? WBC 13.3 (H) 4.0 - 10.5 K/uL  ? RBC 3.25 (L) 4.22 - 5.81 MIL/uL  ? Hemoglobin 8.9 (L) 13.0 - 17.0 g/dL  ? HCT 28.4 (L) 39.0 - 52.0 %  ? MCV 87.4 80.0 - 100.0 fL  ? MCH 27.4 26.0 - 34.0 pg  ? MCHC 31.3 30.0 - 36.0  g/dL  ? RDW 17.8 (H) 11.5 - 15.5 %  ? Platelets 47 (L) 150 - 400 K/uL  ?  Comment: SPECIMEN CHECKED FOR CLOTS ?Immature Platelet Fraction may be ?clinically indicated, consider ?ordering this additional test ?[id number] ?CONSISTENT WITH PREVIOUS RESULT ?PLATELET COUNT CONFIRMED BY SMEAR ?  ? nRBC 1.3 (H) 0.0 - 0.2 %  ?  Comment: Performed at [facility], [address] [place], [place] [address]  ? ? ? ? ?Assessment & Plan:  ?KENNETH CUARESMA is a 88 y.o. male with infected port. Will remove in the PACU minor  room later today.  ?-Discussed risk of bleeding, infection, incomplete removal ?-Antibiotics after and packing ?-Will get labs and cultures from Oncology  ? ? ?All questions were answered to the satisfaction of the patient. ? ? ? ?[name] ?08/07/2021, 9:47 AM  ? ? ? ? ? ?

## 2021-08-07 NOTE — Discharge Instructions (Addendum)
Pack the wound daily with the iodoform packing and cover with gauze and paper tape. ?Hold direct firm pressure on the area if it is bleeding for 10 minutes. Some minor bleeding is expected.  ?If it is more than that, do not panic, just hold firm pressure as this is just a small skin bleeder.  ?Take the antibiotic daily. ?Call with questions or concerns. ?Will see you in the office 08/14/2021 ?

## 2021-08-07 NOTE — Patient Instructions (Signed)
[facility]  Discharge Instructions: ?Thank you for choosing [facility] to provide your oncology and hematology care.  ?If you have a lab appointment with the [facility], please come in thru the Main Entrance and check in at the main information desk. ? ?Wear comfortable clothing and clothing appropriate for easy access to any Portacath or PICC line.  ? ?We strive to give you quality time with your provider. You may need to reschedule your appointment if you arrive late (15 or more minutes).  Arriving late affects you and other patients whose appointments are after yours.  Also, if you miss three or more appointments without notifying the office, you may be dismissed from the clinic at the provider?s discretion.    ?  ?For prescription refill requests, have your pharmacy contact our office and allow 72 hours for refills to be completed.   ? ?Today you did not need Blood transfusion    ?  ?To help prevent nausea and vomiting after your treatment, we encourage you to take your nausea medication as directed. ? ?BELOW ARE SYMPTOMS THAT SHOULD BE REPORTED IMMEDIATELY: ?*FEVER GREATER THAN 100.4 F (38 ?C) OR HIGHER ?*CHILLS OR SWEATING ?*NAUSEA AND VOMITING THAT IS NOT CONTROLLED WITH YOUR NAUSEA MEDICATION ?*UNUSUAL SHORTNESS OF BREATH ?*UNUSUAL BRUISING OR BLEEDING ?*URINARY PROBLEMS (pain or burning when urinating, or frequent urination) ?*BOWEL PROBLEMS (unusual diarrhea, constipation, pain near the anus) ?TENDERNESS IN MOUTH AND THROAT WITH OR WITHOUT PRESENCE OF ULCERS (sore throat, sores in mouth, or a toothache) ?UNUSUAL RASH, SWELLING OR PAIN  ?UNUSUAL VAGINAL DISCHARGE OR ITCHING  ? ?Items with * indicate a potential emergency and should be followed up as soon as possible or go to the Emergency Department if any problems should occur. ? ?Please show the CHEMOTHERAPY ALERT CARD or IMMUNOTHERAPY ALERT CARD at check-in to the Emergency Department and triage nurse. ? ?Should you have  questions after your visit or need to cancel or reschedule your appointment, please contact [facility] [phone]  and follow the prompts.  Office hours are 8:00 a.m. to 4:30 p.m. Monday - Friday. Please note that voicemails left after 4:00 p.m. may not be returned until the following business day.  We are closed weekends and major holidays. You have access to a nurse at all times for urgent questions. Please call the main number to the clinic [phone] and follow the prompts. ? ?For any non-urgent questions, you may also contact your provider using MyChart. We now offer e-Visits for anyone 81 and older to request care online for non-urgent symptoms. For details visit [email or web address]. ?  ?Also download the MyChart app! Go to the app store, search "MyChart", open the app, select [facility], and log in with your MyChart username and password. ? ?Due to Covid, a mask is required upon entering the hospital/clinic. If you do not have a mask, one will be given to you upon arrival. For doctor visits, patients may have 1 support person aged 8 or older with them. For treatment visits, patients cannot have anyone with them due to current Covid guidelines and our immunocompromised population.  ?

## 2021-08-07 NOTE — Progress Notes (Addendum)
Patient presents today for Port flush with lab draw and possible blood transfusion per providers order.  Vital signs WNL.   ? ?Assessment of Port a cath, the site was swollen and reddened with a 3 cm black/purple bruise on the top of the site.  Tarri Abernethy PA notified and she came and assessed the patient.   ? ? ?Hgb 8.9, no transfusion needed per parameters.   ? ? ? ?Per Tarri Abernethy PA's message: ?Dr. Constance Haw will be removing his port today and will take care of antibiotics. ?Please have labs com ASAP to check blood cultures.  Also need a differential added to his CBC.  Patient needs to check in up front for PACU/Day Surgery at 10:30 AM ? ? ?

## 2021-08-07 NOTE — Progress Notes (Signed)
I was called to patient's room by RN due to concern for abnormal appearance of port. ?Patient's port-site appears swollen with underlying fluctuance and purpleish discoloration.  There is surrounding erythema and warmth.  Patient reports that it has been sore since last night.  He denies any fever or chills.  No drainage. ?We will check peripheral blood cultures and CBC with differential. ?Spoke with Dr. Constance Haw, who had inserted his port in May 2022, and she will be taking patient for port removal later this morning.  She states that she will take care of antibiotic prescription after port removal. ?The above plan was discussed with the patient at bedside, and he is agreeable. ? ?Harriett Rush, PA-C  ?08/07/21 9:58 AM  ?

## 2021-08-07 NOTE — Op Note (Signed)
[facility] ?Procedure Note ? ?08/07/21 ? ?Preoperative Diagnosis: Infect left  sided port a catheter in place  ?  ?Postoperative Diagnosis: Same ?  ?Procedure(s) Performed:  Removal of left sided jugular port  ?  ?Surgeon: Lanell Matar. Constance Haw, MD ?  ?Assistants: No qualified resident was available  ?  ?Anesthesia: Lidocaine 1% ?  ?Specimens: Culture ?  ?Estimated Blood Loss: Minimal ?  ?Wound Class:Dirty/ infected  ?  ?Procedure Indications: [name] has had his port since 09/2020 and today it looks very red and swollen. He had it accessed on Monday. Given the way it looks I am concerned about infection and this needs to be removed. Discussed risk of bleeding, infection, packing, and he agrees.  ? ?Findings: Hematoma above port ?  ?Procedure: The patient was taken to the procedure room and placed semi upright. The area of the left chest was prepped. Lidocaine 1% was injected. An incision was made over the port and a hematoma was noted. Cultures were obtained. The area was injected more with lidocaine. The port was removed with sharp dissection. Pressure was held on the site of the port after irrigation and the jugular entry site for 10 minutes. The port site was packed with iodoform plain 1/4 inch gauze. Gauze and paper tape were placed on top.  ? ?Final inspection revealed acceptable hemostasis. The patient tolerated the procedure well.  ?  ?[name], MD ?[facility] ?[place], [place] [address] ?[phone] (office) ?  ?

## 2021-08-08 ENCOUNTER — Encounter (HOSPITAL_COMMUNITY): Payer: Self-pay | Admitting: General Surgery

## 2021-08-09 ENCOUNTER — Inpatient Hospital Stay (HOSPITAL_COMMUNITY)
Admission: EM | Admit: 2021-08-09 | Discharge: 2021-08-11 | DRG: 315 | Disposition: A | Discharge status: Home Health | Payer: Medicare Other | Attending: Internal Medicine | Admitting: Internal Medicine

## 2021-08-09 ENCOUNTER — Emergency Department (HOSPITAL_COMMUNITY)
Admission: EM | Admit: 2021-08-09 | Discharge: 2021-08-09 | Disposition: A | Discharge status: Home / Self Care | Payer: Medicare Other | Source: Home / Self Care | Attending: Emergency Medicine | Admitting: Emergency Medicine

## 2021-08-09 ENCOUNTER — Encounter (HOSPITAL_COMMUNITY): Payer: Self-pay | Admitting: Emergency Medicine

## 2021-08-09 ENCOUNTER — Other Ambulatory Visit: Payer: Self-pay

## 2021-08-09 ENCOUNTER — Encounter (HOSPITAL_COMMUNITY): Payer: Self-pay

## 2021-08-09 ENCOUNTER — Emergency Department (HOSPITAL_COMMUNITY): Payer: Medicare Other

## 2021-08-09 DIAGNOSIS — Y838 Other surgical procedures as the cause of abnormal reaction of the patient, or of later complication, without mention of misadventure at the time of the procedure: Secondary | ICD-10-CM | POA: Diagnosis present

## 2021-08-09 DIAGNOSIS — D696 Thrombocytopenia, unspecified: Secondary | ICD-10-CM | POA: Diagnosis present

## 2021-08-09 DIAGNOSIS — Z79899 Other long term (current) drug therapy: Secondary | ICD-10-CM | POA: Diagnosis not present

## 2021-08-09 DIAGNOSIS — T80218A Other infection due to central venous catheter, initial encounter: Secondary | ICD-10-CM | POA: Diagnosis present

## 2021-08-09 DIAGNOSIS — Z85830 Personal history of malignant neoplasm of bone: Secondary | ICD-10-CM

## 2021-08-09 DIAGNOSIS — A419 Sepsis, unspecified organism: Principal | ICD-10-CM

## 2021-08-09 DIAGNOSIS — E440 Moderate protein-calorie malnutrition: Secondary | ICD-10-CM | POA: Diagnosis present

## 2021-08-09 DIAGNOSIS — J9621 Acute and chronic respiratory failure with hypoxia: Secondary | ICD-10-CM | POA: Diagnosis not present

## 2021-08-09 DIAGNOSIS — Z9842 Cataract extraction status, left eye: Secondary | ICD-10-CM | POA: Diagnosis not present

## 2021-08-09 DIAGNOSIS — Z9981 Dependence on supplemental oxygen: Secondary | ICD-10-CM

## 2021-08-09 DIAGNOSIS — R64 Cachexia: Secondary | ICD-10-CM | POA: Diagnosis present

## 2021-08-09 DIAGNOSIS — R58 Hemorrhage, not elsewhere classified: Secondary | ICD-10-CM

## 2021-08-09 DIAGNOSIS — Z961 Presence of intraocular lens: Secondary | ICD-10-CM | POA: Diagnosis present

## 2021-08-09 DIAGNOSIS — I959 Hypotension, unspecified: Secondary | ICD-10-CM | POA: Diagnosis not present

## 2021-08-09 DIAGNOSIS — E46 Unspecified protein-calorie malnutrition: Secondary | ICD-10-CM | POA: Diagnosis present

## 2021-08-09 DIAGNOSIS — L7622 Postprocedural hemorrhage and hematoma of skin and subcutaneous tissue following other procedure: Secondary | ICD-10-CM | POA: Insufficient documentation

## 2021-08-09 DIAGNOSIS — Z7952 Long term (current) use of systemic steroids: Secondary | ICD-10-CM

## 2021-08-09 DIAGNOSIS — Z87891 Personal history of nicotine dependence: Secondary | ICD-10-CM | POA: Diagnosis not present

## 2021-08-09 DIAGNOSIS — R001 Bradycardia, unspecified: Secondary | ICD-10-CM | POA: Diagnosis not present

## 2021-08-09 DIAGNOSIS — E860 Dehydration: Secondary | ICD-10-CM | POA: Diagnosis present

## 2021-08-09 DIAGNOSIS — Z9841 Cataract extraction status, right eye: Secondary | ICD-10-CM | POA: Diagnosis not present

## 2021-08-09 DIAGNOSIS — Z9889 Other specified postprocedural states: Secondary | ICD-10-CM

## 2021-08-09 DIAGNOSIS — Z66 Do not resuscitate: Secondary | ICD-10-CM | POA: Diagnosis present

## 2021-08-09 DIAGNOSIS — N1831 Chronic kidney disease, stage 3a: Secondary | ICD-10-CM | POA: Diagnosis present

## 2021-08-09 DIAGNOSIS — T82838A Hemorrhage of vascular prosthetic devices, implants and grafts, initial encounter: Secondary | ICD-10-CM | POA: Diagnosis present

## 2021-08-09 DIAGNOSIS — E872 Acidosis, unspecified: Secondary | ICD-10-CM | POA: Diagnosis present

## 2021-08-09 DIAGNOSIS — N179 Acute kidney failure, unspecified: Secondary | ICD-10-CM | POA: Diagnosis present

## 2021-08-09 DIAGNOSIS — D500 Iron deficiency anemia secondary to blood loss (chronic): Secondary | ICD-10-CM | POA: Diagnosis not present

## 2021-08-09 DIAGNOSIS — K219 Gastro-esophageal reflux disease without esophagitis: Secondary | ICD-10-CM | POA: Diagnosis present

## 2021-08-09 DIAGNOSIS — R42 Dizziness and giddiness: Secondary | ICD-10-CM | POA: Diagnosis not present

## 2021-08-09 DIAGNOSIS — R6520 Severe sepsis without septic shock: Secondary | ICD-10-CM | POA: Diagnosis not present

## 2021-08-09 DIAGNOSIS — Z841 Family history of disorders of kidney and ureter: Secondary | ICD-10-CM | POA: Diagnosis not present

## 2021-08-09 DIAGNOSIS — R0902 Hypoxemia: Secondary | ICD-10-CM | POA: Diagnosis present

## 2021-08-09 DIAGNOSIS — Z681 Body mass index (BMI) 19 or less, adult: Secondary | ICD-10-CM

## 2021-08-09 DIAGNOSIS — R0602 Shortness of breath: Secondary | ICD-10-CM | POA: Diagnosis not present

## 2021-08-09 DIAGNOSIS — D469 Myelodysplastic syndrome, unspecified: Secondary | ICD-10-CM | POA: Diagnosis present

## 2021-08-09 DIAGNOSIS — J449 Chronic obstructive pulmonary disease, unspecified: Secondary | ICD-10-CM | POA: Diagnosis present

## 2021-08-09 LAB — CBC WITH DIFFERENTIAL/PLATELET
Band Neutrophils: 4 %
Band Neutrophils: 3 %
Basophils Absolute: 0 10*3/uL (ref 0.0–0.1)
Basophils Absolute: 0 10*3/uL (ref 0.0–0.1)
Basophils Relative: 0 %
Basophils Relative: 0 %
Blasts: 4 %
Blasts: 7 %
Eosinophils Absolute: 0 10*3/uL (ref 0.0–0.5)
Eosinophils Absolute: 0 10*3/uL (ref 0.0–0.5)
Eosinophils Relative: 0 %
Eosinophils Relative: 0 %
HCT: 19.8 % — ABNORMAL LOW (ref 39.0–52.0)
HCT: 25.5 % — ABNORMAL LOW (ref 39.0–52.0)
Hemoglobin: 5.6 g/dL — CL (ref 13.0–17.0)
Hemoglobin: 7.8 g/dL — ABNORMAL LOW (ref 13.0–17.0)
Lymphocytes Relative: 24 %
Lymphocytes Relative: 13 %
Lymphs Abs: 6.1 10*3/uL — ABNORMAL HIGH (ref 0.7–4.0)
Lymphs Abs: 5.3 10*3/uL — ABNORMAL HIGH (ref 0.7–4.0)
MCH: 27.3 pg (ref 26.0–34.0)
MCH: 28.1 pg (ref 26.0–34.0)
MCHC: 28.3 g/dL — ABNORMAL LOW (ref 30.0–36.0)
MCHC: 30.6 g/dL (ref 30.0–36.0)
MCV: 96.6 fL (ref 80.0–100.0)
MCV: 91.7 fL (ref 80.0–100.0)
Metamyelocytes Relative: 4 %
Metamyelocytes Relative: 6 %
Monocytes Absolute: 0.8 10*3/uL (ref 0.1–1.0)
Monocytes Absolute: 0.8 10*3/uL (ref 0.1–1.0)
Monocytes Relative: 3 %
Monocytes Relative: 2 %
Myelocytes: 3 %
Myelocytes: 2 %
Neutro Abs: 15.7 10*3/uL — ABNORMAL HIGH (ref 1.7–7.7)
Neutro Abs: 28.6 10*3/uL — ABNORMAL HIGH (ref 1.7–7.7)
Neutrophils Relative %: 58 %
Neutrophils Relative %: 67 %
Platelets: 33 10*3/uL — ABNORMAL LOW (ref 150–400)
Platelets: 45 10*3/uL — ABNORMAL LOW (ref 150–400)
RBC: 2.05 MIL/uL — ABNORMAL LOW (ref 4.22–5.81)
RBC: 2.78 MIL/uL — ABNORMAL LOW (ref 4.22–5.81)
RDW: 19 % — ABNORMAL HIGH (ref 11.5–15.5)
RDW: 18.6 % — ABNORMAL HIGH (ref 11.5–15.5)
WBC: 25.4 10*3/uL — ABNORMAL HIGH (ref 4.0–10.5)
WBC: 40.9 10*3/uL — ABNORMAL HIGH (ref 4.0–10.5)
nRBC: 1.3 % — ABNORMAL HIGH (ref 0.0–0.2)
nRBC: 2 /100 WBC — ABNORMAL HIGH
nRBC: 1.1 % — ABNORMAL HIGH (ref 0.0–0.2)
nRBC: 3 /100 WBC — ABNORMAL HIGH

## 2021-08-09 LAB — COMPREHENSIVE METABOLIC PANEL
ALT: 25 U/L (ref 0–44)
AST: 24 U/L (ref 15–41)
Albumin: 2.7 g/dL — ABNORMAL LOW (ref 3.5–5.0)
Alkaline Phosphatase: 59 U/L (ref 38–126)
Anion gap: 8 (ref 5–15)
BUN: 50 mg/dL — ABNORMAL HIGH (ref 8–23)
CO2: 13 mmol/L — ABNORMAL LOW (ref 22–32)
Calcium: 8.3 mg/dL — ABNORMAL LOW (ref 8.9–10.3)
Chloride: 117 mmol/L — ABNORMAL HIGH (ref 98–111)
Creatinine, Ser: 2.13 mg/dL — ABNORMAL HIGH (ref 0.61–1.24)
GFR, Estimated: 29 mL/min — ABNORMAL LOW (ref >60)
Glucose, Bld: 156 mg/dL — ABNORMAL HIGH (ref 70–99)
Potassium: 3.8 mmol/L (ref 3.5–5.1)
Sodium: 138 mmol/L (ref 135–145)
Total Bilirubin: 0.4 mg/dL (ref 0.3–1.2)
Total Protein: 5.9 g/dL — ABNORMAL LOW (ref 6.5–8.1)

## 2021-08-09 LAB — I-STAT CHEM 8, ED
BUN: 53 mg/dL — ABNORMAL HIGH (ref 8–23)
Calcium, Ion: 1.06 mmol/L — ABNORMAL LOW (ref 1.15–1.40)
Chloride: 116 mmol/L — ABNORMAL HIGH (ref 98–111)
Creatinine, Ser: 2.3 mg/dL — ABNORMAL HIGH (ref 0.61–1.24)
Glucose, Bld: 107 mg/dL — ABNORMAL HIGH (ref 70–99)
HCT: 27 % — ABNORMAL LOW (ref 39.0–52.0)
Hemoglobin: 9.2 g/dL — ABNORMAL LOW (ref 13.0–17.0)
Potassium: 4.6 mmol/L (ref 3.5–5.1)
Sodium: 138 mmol/L (ref 135–145)
TCO2: 14 mmol/L — ABNORMAL LOW (ref 22–32)

## 2021-08-09 LAB — LACTIC ACID, PLASMA: Lactic Acid, Venous: 3.3 mmol/L (ref 0.5–1.9)

## 2021-08-09 LAB — PROCALCITONIN: Procalcitonin: 0.25 ng/mL

## 2021-08-09 LAB — PREPARE RBC (CROSSMATCH)

## 2021-08-09 MED ORDER — METRONIDAZOLE 500 MG/100ML IV SOLN
500.0000 mg | Freq: Once | INTRAVENOUS | Status: AC
  Administered 2021-08-09: 500 mg via INTRAVENOUS
  Filled 2021-08-09: qty 100

## 2021-08-09 MED ORDER — PREDNISONE 10 MG PO TABS
5.0000 mg | ORAL_TABLET | Freq: Every day | ORAL | Status: DC
  Administered 2021-08-10 – 2021-08-11 (×2): 5 mg via ORAL
  Filled 2021-08-09 (×2): qty 1

## 2021-08-09 MED ORDER — SILVER NITRATE-POT NITRATE 75-25 % EX MISC
10.0000 | Freq: Once | CUTANEOUS | Status: AC
Start: 2021-08-09 — End: 2021-08-09
  Administered 2021-08-09: 10 via TOPICAL
  Filled 2021-08-09: qty 10

## 2021-08-09 MED ORDER — METHYLPREDNISOLONE SODIUM SUCC 125 MG IJ SOLR
125.0000 mg | Freq: Once | INTRAMUSCULAR | Status: AC
  Administered 2021-08-09: 125 mg via INTRAVENOUS
  Filled 2021-08-09: qty 2

## 2021-08-09 MED ORDER — SODIUM CHLORIDE 0.9 % IV BOLUS
1000.0000 mL | Freq: Once | INTRAVENOUS | Status: AC
  Administered 2021-08-09: 1000 mL via INTRAVENOUS

## 2021-08-09 MED ORDER — SODIUM CHLORIDE 0.9 % IV BOLUS
1000.0000 mL | Freq: Once | INTRAVENOUS | Status: AC
Start: 2021-08-09 — End: 2021-08-09
  Administered 2021-08-09: 1000 mL via INTRAVENOUS

## 2021-08-09 MED ORDER — CHLORHEXIDINE GLUCONATE CLOTH 2 % EX PADS
6.0000 | MEDICATED_PAD | Freq: Every day | CUTANEOUS | Status: DC
  Administered 2021-08-10: 6 via TOPICAL

## 2021-08-09 MED ORDER — METRONIDAZOLE 500 MG/100ML IV SOLN
500.0000 mg | Freq: Two times a day (BID) | INTRAVENOUS | Status: DC
  Administered 2021-08-09 – 2021-08-11 (×4): 500 mg via INTRAVENOUS
  Filled 2021-08-09 (×4): qty 100

## 2021-08-09 MED ORDER — MIDODRINE HCL 5 MG PO TABS
2.5000 mg | ORAL_TABLET | Freq: Three times a day (TID) | ORAL | Status: DC
  Administered 2021-08-09 – 2021-08-10 (×5): 2.5 mg via ORAL
  Filled 2021-08-09 (×6): qty 1

## 2021-08-09 MED ORDER — SODIUM CHLORIDE 0.9% IV SOLUTION: Freq: Once | INTRAVENOUS | Status: DC

## 2021-08-09 MED ORDER — LACTATED RINGERS IV SOLN: INTRAVENOUS | Status: DC

## 2021-08-09 MED ORDER — VANCOMYCIN HCL IN DEXTROSE 1-5 GM/200ML-% IV SOLN
1000.0000 mg | Freq: Once | INTRAVENOUS | Status: AC
  Administered 2021-08-09: 1000 mg via INTRAVENOUS
  Filled 2021-08-09: qty 200

## 2021-08-09 MED ORDER — ONDANSETRON HCL 4 MG/2ML IJ SOLN: 4.0000 mg | Freq: Four times a day (QID) | INTRAMUSCULAR | Status: DC | PRN

## 2021-08-09 MED ORDER — PANTOPRAZOLE SODIUM 40 MG PO TBEC
40.0000 mg | DELAYED_RELEASE_TABLET | Freq: Two times a day (BID) | ORAL | Status: DC
  Administered 2021-08-09 – 2021-08-11 (×5): 40 mg via ORAL
  Filled 2021-08-09 (×5): qty 1

## 2021-08-09 MED ORDER — ONDANSETRON HCL 4 MG PO TABS: 4.0000 mg | ORAL_TABLET | Freq: Four times a day (QID) | ORAL | Status: DC | PRN

## 2021-08-09 MED ORDER — TAMSULOSIN HCL 0.4 MG PO CAPS
0.4000 mg | ORAL_CAPSULE | Freq: Every day | ORAL | Status: DC
  Administered 2021-08-09 – 2021-08-10 (×2): 0.4 mg via ORAL
  Filled 2021-08-09 (×2): qty 1

## 2021-08-09 MED ORDER — VANCOMYCIN HCL IN DEXTROSE 1-5 GM/200ML-% IV SOLN
1000.0000 mg | INTRAVENOUS | Status: DC
Start: 2021-08-11 — End: 2021-08-11
  Administered 2021-08-11: 1000 mg via INTRAVENOUS
  Filled 2021-08-09: qty 200

## 2021-08-09 MED ORDER — IPRATROPIUM-ALBUTEROL 0.5-2.5 (3) MG/3ML IN SOLN
3.0000 mL | Freq: Once | RESPIRATORY_TRACT | Status: AC
  Administered 2021-08-09: 3 mL via RESPIRATORY_TRACT
  Filled 2021-08-09: qty 3

## 2021-08-09 MED ORDER — ALBUTEROL SULFATE (2.5 MG/3ML) 0.083% IN NEBU: 3.0000 mL | INHALATION_SOLUTION | RESPIRATORY_TRACT | Status: DC | PRN

## 2021-08-09 MED ORDER — SODIUM CHLORIDE 0.9 % IV SOLN
2.0000 g | INTRAVENOUS | Status: DC
Start: 2021-08-10 — End: 2021-08-11
  Administered 2021-08-10 – 2021-08-11 (×2): 2 g via INTRAVENOUS
  Filled 2021-08-09 (×2): qty 2

## 2021-08-09 MED ORDER — ACETAMINOPHEN 650 MG RE SUPP: 650.0000 mg | Freq: Four times a day (QID) | RECTAL | Status: DC | PRN

## 2021-08-09 MED ORDER — SODIUM CHLORIDE 0.9 % IV SOLN
10.0000 mL/h | Freq: Once | INTRAVENOUS | Status: AC
Start: 2021-08-09 — End: 2021-08-09
  Administered 2021-08-09: 10 mL/h via INTRAVENOUS

## 2021-08-09 MED ORDER — MEGESTROL ACETATE 400 MG/10ML PO SUSP
400.0000 mg | Freq: Two times a day (BID) | ORAL | Status: DC
Start: 2021-08-09 — End: 2021-08-11
  Administered 2021-08-09 – 2021-08-11 (×5): 400 mg via ORAL
  Filled 2021-08-09 (×5): qty 10

## 2021-08-09 MED ORDER — ONDANSETRON HCL 4 MG/2ML IJ SOLN
4.0000 mg | Freq: Once | INTRAMUSCULAR | Status: AC
  Administered 2021-08-09: 4 mg via INTRAVENOUS
  Filled 2021-08-09: qty 2

## 2021-08-09 MED ORDER — ACETAMINOPHEN 325 MG PO TABS: 650.0000 mg | ORAL_TABLET | Freq: Four times a day (QID) | ORAL | Status: DC | PRN

## 2021-08-09 MED ORDER — SODIUM CHLORIDE 0.9 % IV SOLN
2.0000 g | Freq: Once | INTRAVENOUS | Status: AC
  Administered 2021-08-09: 2 g via INTRAVENOUS
  Filled 2021-08-09: qty 2

## 2021-08-09 MED ORDER — ALBUTEROL SULFATE (2.5 MG/3ML) 0.083% IN NEBU
2.5000 mg | INHALATION_SOLUTION | Freq: Once | RESPIRATORY_TRACT | Status: AC
  Administered 2021-08-09: 2.5 mg via RESPIRATORY_TRACT
  Filled 2021-08-09: qty 3

## 2021-08-09 NOTE — Progress Notes (Signed)
Pharmacy Antibiotic Note ? ?[name] is a 88 y.o. male admitted on 08/09/2021 with sepsis.  Pharmacy has been consulted for Cefepime and Vancomycin dosing. ? ?Plan: ?Cefepime 2gm IV q24hrs ?Vancomycin 1000 mg IV Q 48 hrs. Goal AUC 400-550. ?Expected AUC: 521 ?SCr used: 2.13 ? ? ?Height: 6' (182.9 cm) ?Weight: 51.6 kg (113 lb 12.1 oz) ?IBW/kg (Calculated) : 77.6 ? ?Temp (24hrs), Avg:97.8 ?F (36.6 ?C), Min:97.6 ?F (36.4 ?C), Max:98.1 ?F (36.7 ?C) ? ?Recent Labs  ?Lab 08/04/21 ?9163 08/07/21 ?8466 08/09/21 ?0809 08/09/21 ?5993 08/09/21 ?1027 08/09/21 ?1144  ?WBC 13.5* 13.3* 25.4* 40.9*  --   --   ?CREATININE  --   --  2.30*  --   --  2.13*  ?LATICACIDVEN  --   --   --   --  3.3*  --   ?  ?Estimated Creatinine Clearance: 17.2 mL/min (A) (by C-G formula based on SCr of 2.13 mg/dL (H)).   ? ?No Known Allergies ? ?Antimicrobials this admission: ?Vancomycin 3/25 >>  ?Cefepime 3/25 >>  ? ?Dose adjustments this admission: ? ? ?Microbiology results: ? BCx: pending ? UCx:   ? Sputum:   ? MRSA PCR:  ? ?Thank you for allowing pharmacy to be a part of this patient?s care. ? ?Hart Robinsons A ?08/09/2021 1:50 PM ? ?

## 2021-08-09 NOTE — ED Provider Notes (Signed)
?[facility] ?Provider Note ? ? ?CSN: [id number] ?Arrival date & time: 08/09/21  6546 ? ?  ? ?History ? ?Chief Complaint  ?Patient presents with  ? Shortness of Breath  ? ? ?[name] is a 88 y.o. male. ? ?Patient has a history of myelodysplastic syndrome.  He also had his Port-A-Cath removed 2 days ago and was seen here earlier today for bleeding from his Port-A-Cath site.  Patient now complains of shortness of breath and states that he has had some chills and weakness ? ?The history is provided by the patient and medical records. No language interpreter was used.  ?Shortness of Breath ?Severity:  Moderate ?Onset quality:  Sudden ?Timing:  Constant ?Progression:  Worsening ?Chronicity:  Recurrent ?Context: activity   ?Relieved by:  Nothing ?Ineffective treatments:  None tried ?Associated symptoms: no abdominal pain, no chest pain, no cough, no headaches and no rash   ? ?  ? ?Home Medications ?Prior to Admission medications   ?Medication Sig Start Date End Date Taking? Authorizing Provider  ?albuterol (VENTOLIN HFA) 108 (90 Base) MCG/ACT inhaler Inhale 2 puffs into the lungs every 4 (four) hours as needed for wheezing or shortness of breath. 04/04/20   Roxan Hockey, MD  ?amoxicillin-clavulanate (AUGMENTIN) 875-125 MG tablet Take 1 tablet by mouth every 12 (twelve) hours for 7 days. 08/07/21 08/14/21  Virl Cagey, MD  ?megestrol (MEGACE) 400 MG/10ML suspension Take 10 mLs (400 mg total) by mouth 2 (two) times daily. 07/31/21   Derek Jack, MD  ?midodrine (PROAMATINE) 2.5 MG tablet Take 1 tablet (2.5 mg total) by mouth 3 (three) times daily with meals. 06/23/21   Derek Jack, MD  ?pantoprazole (PROTONIX) 40 MG tablet Take 1 tablet (40 mg total) by mouth 2 (two) times daily. 07/16/21 07/16/22  Deatra James, MD  ?predniSONE (DELTASONE) 5 MG tablet Take 5 mg by mouth daily with breakfast.    [provider]  ?silodosin (RAPAFLO) 4 MG CAPS capsule Take 4 mg by  mouth at bedtime. 06/27/21   [provider]  ?   ? ?Allergies    ?Patient has no known allergies.   ? ?Review of Systems   ?Review of Systems  ?Constitutional:  Negative for appetite change and fatigue.  ?HENT:  Negative for congestion, ear discharge and sinus pressure.   ?Eyes:  Negative for discharge.  ?Respiratory:  Positive for shortness of breath. Negative for cough.   ?Cardiovascular:  Negative for chest pain.  ?Gastrointestinal:  Negative for abdominal pain and diarrhea.  ?Genitourinary:  Negative for frequency and hematuria.  ?Musculoskeletal:  Negative for back pain.  ?     Bleeding from chest  ?Skin:  Negative for rash.  ?Neurological:  Negative for seizures and headaches.  ?Psychiatric/Behavioral:  Negative for hallucinations.   ? ?Physical Exam ?Updated Vital Signs ?BP (!) 108/57   Pulse (!) 109   Temp 97.6 ?F (36.4 ?C) (Rectal)   Resp (!) 26   Ht 6' (1.829 m)   Wt 53 kg   SpO2 100%   BMI 15.85 kg/m?  ?Physical Exam ?Vitals and nursing note reviewed.  ?Constitutional:   ?   Appearance: He is well-developed.  ?HENT:  ?   Head: Normocephalic.  ?   Mouth/Throat:  ?   Mouth: Mucous membranes are moist.  ?Eyes:  ?   General: No scleral icterus. ?   Conjunctiva/sclera: Conjunctivae normal.  ?Neck:  ?   Thyroid: No thyromegaly.  ?Cardiovascular:  ?   Rate  and Rhythm: Regular rhythm. Tachycardia present.  ?   Heart sounds: No murmur heard. ?  No friction rub. No gallop.  ?Pulmonary:  ?   Breath sounds: No stridor. No wheezing or rales.  ?Chest:  ?   Chest wall: No tenderness.  ?Abdominal:  ?   General: There is no distension.  ?   Tenderness: There is no abdominal tenderness. There is no rebound.  ?Musculoskeletal:     ?   General: Normal range of motion.  ?   Cervical back: Neck supple.  ?   Comments: Patient has gauze where he had a Port-A-Cath that has some bleeding.  ?Lymphadenopathy:  ?   Cervical: No cervical adenopathy.  ?Skin: ?   Findings: No erythema or rash.  ?Neurological:  ?   Mental  Status: He is alert and oriented to person, place, and time.  ?   Motor: No abnormal muscle tone.  ?   Coordination: Coordination normal.  ?Psychiatric:     ?   Behavior: Behavior normal.  ? ? ?ED Results / Procedures / Treatments   ?Labs ?(all labs ordered are listed, but only abnormal results are displayed) ?Labs Reviewed  ?CBC WITH DIFFERENTIAL/PLATELET - Abnormal; Notable for the following components:  ?    Result Value  ? WBC 25.4 (*)   ? RBC 2.05 (*)   ? Hemoglobin 5.6 (*)   ? HCT 19.8 (*)   ? MCHC 28.3 (*)   ? RDW 19.0 (*)   ? Platelets 33 (*)   ? nRBC 1.3 (*)   ? Neutro Abs 15.7 (*)   ? Lymphs Abs 6.1 (*)   ? nRBC 2 (*)   ? All other components within normal limits  ?CBC WITH DIFFERENTIAL/PLATELET - Abnormal; Notable for the following components:  ? WBC 40.9 (*)   ? RBC 2.78 (*)   ? Hemoglobin 7.8 (*)   ? HCT 25.5 (*)   ? RDW 18.6 (*)   ? Platelets 45 (*)   ? nRBC 1.1 (*)   ? Neutro Abs 28.6 (*)   ? Lymphs Abs 5.3 (*)   ? nRBC 3 (*)   ? All other components within normal limits  ?I-STAT CHEM 8, ED - Abnormal; Notable for the following components:  ? Chloride 116 (*)   ? BUN 53 (*)   ? Creatinine, Ser 2.30 (*)   ? Glucose, Bld 107 (*)   ? Calcium, Ion 1.06 (*)   ? TCO2 14 (*)   ? Hemoglobin 9.2 (*)   ? HCT 27.0 (*)   ? All other components within normal limits  ?CULTURE, BLOOD (SINGLE)  ?CULTURE, BLOOD (ROUTINE X 2)  ?CULTURE, BLOOD (ROUTINE X 2)  ?LACTIC ACID, PLASMA  ?COMPREHENSIVE METABOLIC PANEL  ?TYPE AND SCREEN  ?PREPARE RBC (CROSSMATCH)  ? ? ?EKG ?None ? ?Radiology ?DG Chest Port 1 View ? ?Result Date: 08/09/2021 ?CLINICAL DATA:  PT c/o sob and bleeding at port site. Pt states he had his port removed several days ago and has had bleeding at the site. HX of CA and COPD. EXAM: PORTABLE CHEST 1 VIEW COMPARISON:  08/07/2021 and older exams. FINDINGS: Cardiac silhouette is normal in size. No mediastinal or hilar masses. Lungs are hyperexpanded. Bilateral interstitial thickening. Bilateral irregular reticular  opacities consistent with scarring. These findings are stable. No lung consolidation to suggest pneumonia and no convincing pulmonary edema. No pleural effusion or pneumothorax. Skeletal structures are demineralized. Several vertebral compression fractures are noted, 2 previously treated with  vertebroplasty, also stable. IMPRESSION: 1. No acute cardiopulmonary disease. 2. Chronic lung abnormalities consistent with COPD and interstitial/parenchymal scarring. Electronically Signed   By: Lajean Manes M.D.   On: 08/09/2021 08:32  ? ?DG Chest Port 1 View ? ?Result Date: 08/07/2021 ?CLINICAL DATA:  Status post port removal EXAM: PORTABLE CHEST - 1 VIEW COMPARISON:  07/21/2021 FINDINGS: Cardiomediastinal silhouette and pulmonary vasculature are within normal limits. Severe emphysematous changes of the lungs again seen. No focal airspace opacity to indicate pneumonia. Interval removal of left chest port. Vertebral augmentation changes seen in the lower thoracic and upper lumbar spine. IMPRESSION: No acute cardiopulmonary process. Electronically Signed   By: Miachel Roux M.D.   On: 08/07/2021 12:21   ? ?Procedures ?Marland Kitchen.Laceration Repair ? ?Date/Time: 08/09/2021 11:05 AM ?Performed by: Milton Ferguson, MD ?Authorized by: Milton Ferguson, MD  ? ?Comments:  ?   Patient had bleeding from where the Port-A-Cath was removed.  Packing was removed.  I placed what is called wound seal into the opening and then packed it with gauze called quick clot.  Then we put a 4 x 4 over it and the bleeding stopped.  ? ? ?Medications Ordered in ED ?Medications  ?metroNIDAZOLE (FLAGYL) IVPB 500 mg (500 mg Intravenous New Bag/Given 08/09/21 1017)  ?vancomycin (VANCOCIN) IVPB 1000 mg/200 mL premix (1,000 mg Intravenous New Bag/Given 08/09/21 1018)  ?sodium chloride 0.9 % bolus 1,000 mL (0 mLs Intravenous Stopped 08/09/21 0900)  ?ondansetron [facility]) injection 4 mg (4 mg Intravenous Given 08/09/21 0826)  ?methylPREDNISolone sodium succinate (SOLU-MEDROL) 125  mg/2 mL injection 125 mg (125 mg Intravenous Given 08/09/21 0831)  ?ipratropium-albuterol (DUONEB) 0.5-2.5 (3) MG/3ML nebulizer solution 3 mL (3 mLs Nebulization Given 08/09/21 0850)  ?albuterol (PROVENTIL) (

## 2021-08-09 NOTE — H&P (Addendum)
?History and Physical  ? ? ?Patient: [name] DGL:[id number] DOB: 04/02/1930 ?DOA: 08/09/2021 ?DOS: the patient was seen and examined on 08/09/2021 ?PCP: Sharilyn Sites, MD  ?Patient coming from: Home ? ?Chief Complaint:  ?Chief Complaint  ?Patient presents with  ? Shortness of Breath  ? ?HPI: [name] is a 88 y.o. male with medical history significant of myelodysplastic syndrome, COPD with chronic hypoxemia on 3 L nasal cannula, GERD, and CKD 3a who had removal of his left chest port on 3/23.  He was changing his dressing yesterday evening when he noticed some persistent oozing and bleeding from the site.  He contacted EMS who brought him to the ED for further evaluation.  Pressure was held in the ED with no further oozing or bleeding noted and he was observed in the ED and eventually discharged.  On his way home on the EMS truck, he became short of breath and was brought back to the ED for further evaluation.  He does describe some chills and weakness at baseline.  He denies any fevers, chest pain, cough, or congestion. ? ?In the ED, he was seen by general surgery with some silver nitrate to the wound site which has helped control the bleed.  He was given some breathing treatments as well as steroids.  He is noted to have significant leukocytosis and has been started on IV antibiotics empirically with blood cultures obtained.  Chest x-ray and urine appear to be within normal limits. ? ?Review of Systems: As mentioned in the history of present illness. All other systems reviewed and are negative. ?Past Medical History:  ?Diagnosis Date  ? Bone cancer ([place])   ? Chronic kidney insufficiency   ? COPD (chronic obstructive pulmonary disease) ([place])   ? GERD (gastroesophageal reflux disease)   ? Pulmonary nodule   ? ?Past Surgical History:  ?Procedure Laterality Date  ? BACK SURGERY    ? BIOPSY  07/15/2021  ? Procedure: BIOPSY;  Surgeon: Harvel Quale, MD;  [facility];  Service:  Gastroenterology;;  ? CATARACT EXTRACTION W/PHACO Right 03/09/2016  ? Procedure: CATARACT EXTRACTION PHACO AND INTRAOCULAR LENS PLACEMENT RIGHT EYE CDE=8.58;  Surgeon: Tonny Branch, MD;  [facility]: [facility];  Service: Ophthalmology;  Laterality: Right;  right  ? CATARACT EXTRACTION W/PHACO Left 04/13/2016  ? Procedure: CATARACT EXTRACTION PHACO AND INTRAOCULAR LENS PLACEMENT (IOC);  Surgeon: Tonny Branch, MD;  [facility]: [facility];  Service: Ophthalmology;  Laterality: Left;  CDE: 8.08  ? ENTEROSCOPY  07/23/2021  ? Procedure: ENTEROSCOPY;  Surgeon: Harvel Quale, MD;  [facility];  Service: Gastroenterology;;  ? ESOPHAGOGASTRODUODENOSCOPY (EGD) WITH PROPOFOL N/A 07/14/2021  ? Procedure: ESOPHAGOGASTRODUODENOSCOPY (EGD) WITH PROPOFOL;  Surgeon: Eloise Harman, DO;  [facility];  Service: Endoscopy;  Laterality: N/A;  ? ESOPHAGOGASTRODUODENOSCOPY (EGD) WITH PROPOFOL N/A 07/15/2021  ? Procedure: ESOPHAGOGASTRODUODENOSCOPY (EGD) WITH PROPOFOL;  Surgeon: Harvel Quale, MD;  [facility];  Service: Gastroenterology;  Laterality: N/A;  ? ESOPHAGOGASTRODUODENOSCOPY (EGD) WITH PROPOFOL N/A 07/23/2021  ? Procedure: ESOPHAGOGASTRODUODENOSCOPY (EGD) WITH PROPOFOL;  Surgeon: Harvel Quale, MD;  [facility];  Service: Gastroenterology;  Laterality: N/A;  ? HEMOSTASIS CLIP PLACEMENT  07/15/2021  ? Procedure: HEMOSTASIS CLIP PLACEMENT;  Surgeon: Harvel Quale, MD;  [facility];  Service: Gastroenterology;;  ? HEMOSTASIS CLIP PLACEMENT  07/23/2021  ? Procedure: HEMOSTASIS CLIP PLACEMENT;  Surgeon: Harvel Quale, MD;  [facility];  Service: Gastroenterology;;  ?  KYPHOPLASTY Bilateral 06/09/2019  ? Procedure: T11 KYPHOPLASTY;  Surgeon: Consuella Lose, MD;  Location: [place];  Service: Neurosurgery;  Laterality: Bilateral;  ? None to Date  10/01/15  ? PORT-A-CATH REMOVAL Left 08/07/2021  ? Procedure: MINOR REMOVAL  PORT-A-CATH;  Surgeon: Virl Cagey, MD;  [facility];  Service: General;  Laterality: Left;  lidocaine 1%  ? PORTACATH PLACEMENT Left 10/11/2020  ? Procedure: INSERTION PORT-A-CATH (attached catheter in left internal jugular);  Surgeon: Virl Cagey, MD;  [facility];  Service: General;  Laterality: Left;  ? ?Social History:  reports that he quit smoking about 30 years ago. His smoking use included cigarettes and cigars. He has never used smokeless tobacco. He reports that he does not currently use alcohol. He reports that he does not use drugs. ? ?No Known Allergies ? ?Family History  ?Problem Relation Age of Onset  ? Kidney disease Mother   ? Kidney disease Sister   ? Colon cancer Neg Hx   ? Gastric cancer Neg Hx   ? Esophageal cancer Neg Hx   ? ? ?Prior to Admission medications   ?Medication Sig Start Date End Date Taking? Authorizing Provider  ?albuterol (VENTOLIN HFA) 108 (90 Base) MCG/ACT inhaler Inhale 2 puffs into the lungs every 4 (four) hours as needed for wheezing or shortness of breath. 04/04/20   Roxan Hockey, MD  ?amoxicillin-clavulanate (AUGMENTIN) 875-125 MG tablet Take 1 tablet by mouth every 12 (twelve) hours for 7 days. 08/07/21 08/14/21  Virl Cagey, MD  ?megestrol (MEGACE) 400 MG/10ML suspension Take 10 mLs (400 mg total) by mouth 2 (two) times daily. 07/31/21   Derek Jack, MD  ?midodrine (PROAMATINE) 2.5 MG tablet Take 1 tablet (2.5 mg total) by mouth 3 (three) times daily with meals. 06/23/21   Derek Jack, MD  ?pantoprazole (PROTONIX) 40 MG tablet Take 1 tablet (40 mg total) by mouth 2 (two) times daily. 07/16/21 07/16/22  Deatra James, MD  ?predniSONE (DELTASONE) 5 MG tablet Take 5 mg by mouth daily with breakfast.    [provider]  ?silodosin (RAPAFLO) 4 MG CAPS capsule Take 4 mg by mouth at bedtime. 06/27/21   [provider]  ? ? ?Physical Exam: ?Vitals:  ? 08/09/21 0945 08/09/21 1000 08/09/21 1030 08/09/21 1100  ?BP:  (!) 80/54 (!) 97/49 (!) 108/57 (!) 95/55  ?Pulse: (!) 108 (!) 110 (!) 109 (!) 104  ?Resp: (!) 34 16 (!) 26 (!) 22  ?Temp:      ?TempSrc:      ?SpO2: 100% 100% 100% 100%  ?Weight:      ?Height:      ? ?Examination: ?Physical Exam: ? ?Constitutional: WN/WD, NAD and appears calm and comfortable, thin ?Neck: Appears normal, supple, no cervical masses, normal ROM, no appreciable thyromegaly ?Respiratory: Clear to auscultation bilaterally, no wheezing, rales, rhonchi or crackles. Normal respiratory effort and patient is not tachypenic. No accessory muscle use.  ?Cardiovascular: RRR, no murmurs / rubs / gallops. S1 and S2 auscultated. No extremity edema. 2+ pedal pulses. No carotid bruits.  Left port site with dressings clean dry and intact. ?Abdomen: Soft, non-tender, non-distended. No masses palpated. No appreciable hepatosplenomegaly. Bowel sounds positive.  ?GU: Deferred. ?Musculoskeletal: No clubbing / cyanosis of digits/nails. No joint deformity upper and lower extremities. Good ROM, no contractures. Normal strength and muscle tone.  ?Skin: No rashes, lesions, ulcers. No induration; Warm and dry.  ?Neurologic: CN 2-12 grossly intact with no focal deficits. ?Psychiatric: Normal judgment and insight. Alert  and oriented x 3.  ? ?Data Reviewed: ? ?There are no new results to review at this time. ? ?Assessment and Plan: ?* Severe sepsis ([place]) ?Patient noted to have SIRS criteria with significant leukocytosis as well as lactic acid elevation ?Continue to monitor repeat labs ?IV fluid bolus and maintain on aggressive IV fluid  ?Continue vancomycin, Flagyl, and cefepime for now ?Check procalcitonin ?Unfortunately, only 1 set of blood culture obtained prior to antibiotic initiation in the ED ?No other obvious source of infection ?Port culture and Gram stain negative ? ?Myelodysplastic syndrome ([place]) ?Noted to have low platelet count as well as anemia which will need to be monitored ?Significant leukocytosis for which  procalcitonin and differential will be ordered, patient is on chronic prednisone ?Transfuse platelets to keep above 50,000 ?Discussed case with Dr. Delton Coombes ? ?AKI (acute kidney injury) ([place]) ?On CKD stage III A ?Baseline cre

## 2021-08-09 NOTE — Assessment & Plan Note (Signed)
Continue PPI ?

## 2021-08-09 NOTE — ED Notes (Signed)
Bleeding controlled at this time.

## 2021-08-09 NOTE — ED Triage Notes (Signed)
Pt to ED via [facility] from home c/o bleeding at removed port site, left chest. Removed Thursday. States that it was removed due to becoming infected at the site.  ?

## 2021-08-09 NOTE — Consult Note (Signed)
[facility] Consult ? ?Reason for Consult: Bleeding at port removal site ?Referring Physician: Milton Ferguson, MD ? ?Chief Complaint   ?Shortness of Breath ?  ? ? ?HPI: [name] is a 88 y.o. male who underwent removal of his left IJ Port-A-Cath on 3/23 secondary to concerns for infection.  At the time, he was noted to have a hematoma overlying the port.  Hemostasis was adequately achieved at that time.  Last night, he presented to the ED with bleeding from the port site.  Pressure was held, and hemostasis was achieved.  The patient was discharged from the ED, but prior to getting home, he was brought back to the hospital, as he felt lightheaded and short of breath.  Upon reevaluation, he had increased bleeding from his port site again.  The site was packed with quick clot, and pressure was held both in the left IJ location and overlying the wound.  Hemostasis was again achieved.  General surgery was called to evaluate the area to make sure did not need further intervention.  Patient states that he is aware that he is thrombocytopenic, and believes that this is the cause of his bleeding. ? ?Patient, in the ED, has been tachycardic in the 110s with borderline blood pressure, but the patient's blood pressure normally runs in the 63W systolic.  His CBC initially demonstrated a hemoglobin of 5.6, but on repeat, the hemoglobin was 7.8.  Patient was also noted to have a significant leukocytosis of 40.9 ? ?Past Medical History:  ?Diagnosis Date  ? Bone cancer ([place])   ? Chronic kidney insufficiency   ? COPD (chronic obstructive pulmonary disease) ([place])   ? GERD (gastroesophageal reflux disease)   ? Pulmonary nodule   ? ? ?Past Surgical History:  ?Procedure Laterality Date  ? BACK SURGERY    ? BIOPSY  07/15/2021  ? Procedure: BIOPSY;  Surgeon: Harvel Quale, MD;  [facility];  Service: Gastroenterology;;  ? CATARACT EXTRACTION W/PHACO Right 03/09/2016  ? Procedure: CATARACT  EXTRACTION PHACO AND INTRAOCULAR LENS PLACEMENT RIGHT EYE CDE=8.58;  Surgeon: Tonny Branch, MD;  [facility];  Service: Ophthalmology;  Laterality: Right;  right  ? CATARACT EXTRACTION W/PHACO Left 04/13/2016  ? Procedure: CATARACT EXTRACTION PHACO AND INTRAOCULAR LENS PLACEMENT (IOC);  Surgeon: Tonny Branch, MD;  [facility];  Service: Ophthalmology;  Laterality: Left;  CDE: 8.08  ? ENTEROSCOPY  07/23/2021  ? Procedure: ENTEROSCOPY;  Surgeon: Harvel Quale, MD;  [facility];  Service: Gastroenterology;;  ? ESOPHAGOGASTRODUODENOSCOPY (EGD) WITH PROPOFOL N/A 07/14/2021  ? Procedure: ESOPHAGOGASTRODUODENOSCOPY (EGD) WITH PROPOFOL;  Surgeon: Eloise Harman, DO;  [facility];  Service: Endoscopy;  Laterality: N/A;  ? ESOPHAGOGASTRODUODENOSCOPY (EGD) WITH PROPOFOL N/A 07/15/2021  ? Procedure: ESOPHAGOGASTRODUODENOSCOPY (EGD) WITH PROPOFOL;  Surgeon: Harvel Quale, MD;  [facility];  Service: Gastroenterology;  Laterality: N/A;  ? ESOPHAGOGASTRODUODENOSCOPY (EGD) WITH PROPOFOL N/A 07/23/2021  ? Procedure: ESOPHAGOGASTRODUODENOSCOPY (EGD) WITH PROPOFOL;  Surgeon: Harvel Quale, MD;  [facility];  Service: Gastroenterology;  Laterality: N/A;  ? HEMOSTASIS CLIP PLACEMENT  07/15/2021  ? Procedure: HEMOSTASIS CLIP PLACEMENT;  Surgeon: Harvel Quale, MD;  [facility];  Service: Gastroenterology;;  ? HEMOSTASIS CLIP PLACEMENT  07/23/2021  ? Procedure: HEMOSTASIS CLIP PLACEMENT;  Surgeon: Harvel Quale, MD;  [facility];  Service: Gastroenterology;;  ? KYPHOPLASTY Bilateral 06/09/2019  ? Procedure: T11 KYPHOPLASTY;  Surgeon: Consuella Lose, MD;  Location:  [facility] [place] [facility];  Service: Neurosurgery;  Laterality: Bilateral;  ? None to Date  10/01/15  ? PORT-A-CATH REMOVAL Left 08/07/2021  ? Procedure: MINOR REMOVAL PORT-A-CATH;  Surgeon: Virl Cagey, MD;  [facility];  Service: General;  Laterality:  Left;  lidocaine 1%  ? PORTACATH PLACEMENT Left 10/11/2020  ? Procedure: INSERTION PORT-A-CATH (attached catheter in left internal jugular);  Surgeon: Virl Cagey, MD;  [facility];  Service: General;  Laterality: Left;  ? ? ?Family History  ?Problem Relation Age of Onset  ? Kidney disease Mother   ? Kidney disease Sister   ? Colon cancer Neg Hx   ? Gastric cancer Neg Hx   ? Esophageal cancer Neg Hx   ? ? ?Social History  ? ?Tobacco Use  ? Smoking status: Former  ?  Years: 15.00  ?  Types: Cigarettes, Cigars  ?  Quit date: 10/01/1990  ?  Years since quitting: 30.8  ? Smokeless tobacco: Never  ? Tobacco comments:  ?  Quit x 25-30 years; 2 cigars daily when smoked  ?Vaping Use  ? Vaping Use: Never used  ?Substance Use Topics  ? Alcohol use: Not Currently  ?  Alcohol/week: 0.0 standard drinks  ? Drug use: No  ? ? ?Medications: I have reviewed the patient's current medications. ? ?No Known Allergies ? ? ?ROS:  ?Constitutional: positive for fatigue, negative for chills and fevers ?Respiratory: positive for shortness of breath ?Cardiovascular: negative for chest pain ?Gastrointestinal: negative for abdominal pain, nausea, and vomiting ?Integument/breast: positive for chest wound intermittently bleeding ? ?Blood pressure (!) 95/55, pulse (!) 104, temperature 97.6 ?F (36.4 ?C), temperature source Rectal, resp. rate (!) 22, height 6' (1.829 m), weight 53 kg, SpO2 100 %. ?Physical Exam ?Vitals reviewed.  ?Constitutional:   ?   General: He is not in acute distress. ?   Comments: Cachectic  ?HENT:  ?   Head: Normocephalic and atraumatic.  ?Eyes:  ?   Extraocular Movements: Extraocular movements intact.  ?   Pupils: Pupils are equal, round, and reactive to light.  ?Cardiovascular:  ?   Rate and Rhythm: Tachycardia present.  ?Pulmonary:  ?   Effort: Pulmonary effort is normal.  ?Abdominal:  ?   Palpations: Abdomen is soft.  ?   Tenderness: There is no abdominal tenderness.  ?Musculoskeletal:     ?   General: Normal  range of motion.  ?Skin: ?   General: Skin is warm and dry.  ?   Comments: Left chest site with packing in place, packing removed and no active bleeding noted  ?Neurological:  ?   General: No focal deficit present.  ?   Mental Status: He is alert and oriented to person, place, and time.  ?Psychiatric:     ?   Mood and Affect: Mood normal.     ?   Behavior: Behavior normal.  ? ? ?Results: ?Results for orders placed or performed during the hospital encounter of 08/09/21 (from the past 48 hour(s))  ?I-stat chem 8, ED (not at [facility] or [facility])     Status: Abnormal  ? Collection Time: 08/09/21  8:09 AM  ?Result Value Ref Range  ? Sodium 138 135 - 145 mmol/L  ? Potassium 4.6 3.5 - 5.1 mmol/L  ? Chloride 116 (H) 98 - 111 mmol/L  ? BUN 53 (H) 8 - 23 mg/dL  ? Creatinine, Ser 2.30 (H) 0.61 - 1.24 mg/dL  ? Glucose, Bld 107 (H) 70 - 99 mg/dL  ?  Comment: Glucose reference range applies only to samples taken after fasting for at least 8 hours.  ? Calcium, Ion 1.06 (L) 1.15 - 1.40 mmol/L  ? TCO2 14 (L) 22 - 32 mmol/L  ? Hemoglobin 9.2 (L) 13.0 - 17.0 g/dL  ? HCT 27.0 (L) 39.0 - 52.0 %  ?CBC with Differential/Platelet     Status: Abnormal  ? Collection Time: 08/09/21  8:09 AM  ?Result Value Ref Range  ? WBC 25.4 (H) 4.0 - 10.5 K/uL  ? RBC 2.05 (L) 4.22 - 5.81 MIL/uL  ? Hemoglobin 5.6 (LL) 13.0 - 17.0 g/dL  ?  Comment: This critical result has verified and been called to [facility] by Joaquin Courts on 03 25 2023 at [place], and has been read back.   ? HCT 19.8 (L) 39.0 - 52.0 %  ? MCV 96.6 80.0 - 100.0 fL  ? MCH 27.3 26.0 - 34.0 pg  ? MCHC 28.3 (L) 30.0 - 36.0 g/dL  ? RDW 19.0 (H) 11.5 - 15.5 %  ? Platelets 33 (L) 150 - 400 K/uL  ?  Comment: Immature Platelet Fraction may be ?clinically indicated, consider ?ordering this additional test ?[id number] ?  ? nRBC 1.3 (H) 0.0 - 0.2 %  ? Neutrophils Relative % 58 %  ? Neutro Abs 15.7 (H) 1.7 - 7.7 K/uL  ? Band Neutrophils 4 %  ? Lymphocytes Relative 24 %  ? Lymphs Abs 6.1 (H) 0.7 - 4.0  K/uL  ? Monocytes Relative 3 %  ? Monocytes Absolute 0.8 0.1 - 1.0 K/uL  ? Eosinophils Relative 0 %  ? Eosinophils Absolute 0.0 0.0 - 0.5 K/uL  ? Basophils Relative 0 %  ? Basophils Absolute 0.0 0.0 - 0.1 K/uL

## 2021-08-09 NOTE — ED Notes (Signed)
MD Zammit placed wound seal powder and packed wound with quick clot gauze with adherent over wound and medipore tape.  ?

## 2021-08-09 NOTE — Sepsis Progress Note (Signed)
Spoke to bedside nurse regarding need for repeat lactic acid. She is going to notify phlebotomy to draw lab. ?

## 2021-08-09 NOTE — ED Triage Notes (Signed)
Pt to the ED via [facility] after he became short of breath while riding back to [place] with no oxygen after being dcd from the ED earlier today. Pt is on 3L chronic oxygen ay home. ? ? ?Pt also had a port in the left chest that was removed due to infection that began bleeding profusely. ? ?Bleeding controlled at this time. ? ?

## 2021-08-09 NOTE — Assessment & Plan Note (Addendum)
On CKD stage III A ?Baseline creatinine near 1.3 ?Continue to hydrate and monitor ?Strict I's and O's ?Avoid nephrotoxic agents ?-Improving, continue to monitor ?

## 2021-08-09 NOTE — Assessment & Plan Note (Addendum)
Patient noted to have SIRS criteria with significant leukocytosis as well as lactic acid elevation ?-Leukocytosis and lactic acidosis resolving ?IV fluid bolus and maintain on aggressive IV fluid  ?Continue vancomycin, Flagyl, and cefepime for now ?Check procalcitonin ?Unfortunately, only 1 set of blood culture obtained prior to antibiotic initiation in the ED ?No other obvious source of infection ?Port culture and Gram stain negative ?-Cultures pending ?

## 2021-08-09 NOTE — Discharge Instructions (Signed)
It was our pleasure to provide your ER care today - we hope that you feel better. ? ?If bleeding recurs, hold firm, direct pressure directly over the bleeding site for 5-10 minutes - if bleeding persists, return to ER.   ? ?Follow up with your doctor/surgeon as planned Tuesday. ? ?Return to ER right away if worse, persistent bleeding, weak/fainting, chest pain, trouble breathing, fevers, or other concern.  ?

## 2021-08-09 NOTE — ED Notes (Signed)
Date and time results received: 08/09/21 8:40 AM ? ?Test: Hbg ?Critical Value: 5.6 ? ?Name of Provider Notified: Dr. Roderic Palau ? ?Orders Received? Or Actions Taken?: see orders ?

## 2021-08-09 NOTE — Assessment & Plan Note (Signed)
Currently without any significant bronchospasms ?DuoNebs as needed ?Has chronic hypoxemia on 3 L nasal cannula ?

## 2021-08-09 NOTE — Assessment & Plan Note (Addendum)
Noted to have low platelet count as well as anemia which will need to be monitored ?Significant leukocytosis for which procalcitonin and differential will be ordered, patient is on chronic prednisone ?Transfuse platelets to keep above 50,000 ?Discussed case with Dr. Delton Coombes 3/25 ?-Transfused 2 unit PRBCs on 3/26 ?

## 2021-08-09 NOTE — ED Provider Notes (Signed)
?[facility] ?Provider Note ? ? ?CSN: [id number] ?Arrival date & time: 08/09/21  0217 ? ?  ? ?History ? ?Chief Complaint  ?Patient presents with  ? Coagulation Disorder  ? ? ?[name] is a 88 y.o. male. ? ?Patient s/p removal of port left chest two days ago, indicates changed dressing this evening and noted persistent oozing/bleeding from site since then. Hx thrombocytopenia/chronic. Denies other abnormal bruising or bleeding. No faintness or dizziness. No chest pain or sob.  ? ?The history is provided by the patient, the EMS personnel and medical records.  ? ?  ? ?Home Medications ?Prior to Admission medications   ?Medication Sig Start Date End Date Taking? Authorizing Provider  ?albuterol (VENTOLIN HFA) 108 (90 Base) MCG/ACT inhaler Inhale 2 puffs into the lungs every 4 (four) hours as needed for wheezing or shortness of breath. 04/04/20   Roxan Hockey, MD  ?amoxicillin-clavulanate (AUGMENTIN) 875-125 MG tablet Take 1 tablet by mouth every 12 (twelve) hours for 7 days. 08/07/21 08/14/21  Virl Cagey, MD  ?megestrol (MEGACE) 400 MG/10ML suspension Take 10 mLs (400 mg total) by mouth 2 (two) times daily. 07/31/21   Derek Jack, MD  ?midodrine (PROAMATINE) 2.5 MG tablet Take 1 tablet (2.5 mg total) by mouth 3 (three) times daily with meals. 06/23/21   Derek Jack, MD  ?pantoprazole (PROTONIX) 40 MG tablet Take 1 tablet (40 mg total) by mouth 2 (two) times daily. 07/16/21 07/16/22  Deatra James, MD  ?predniSONE (DELTASONE) 5 MG tablet Take 5 mg by mouth daily with breakfast.    [provider]  ?silodosin (RAPAFLO) 4 MG CAPS capsule Take 4 mg by mouth at bedtime. 06/27/21   [provider]  ?   ? ?Allergies    ?Patient has no known allergies.   ? ?Review of Systems   ?Review of Systems  ?Constitutional:  Negative for fever.  ?Respiratory:  Negative for shortness of breath.   ?Cardiovascular:  Negative for chest pain.  ?Neurological:  Negative for  light-headedness.  ? ?Physical Exam ?Updated Vital Signs ?BP 107/63   Pulse 97   Temp 98 ?F (36.7 ?C) (Oral)   Resp 18   Ht 1.829 m (6')   Wt 53 kg   SpO2 96%   BMI 15.85 kg/m?  ?Physical Exam ?Vitals and nursing note reviewed.  ?Constitutional:   ?   Appearance: Normal appearance. He is well-developed.  ?HENT:  ?   Head: Atraumatic.  ?   Nose: Nose normal.  ?   Mouth/Throat:  ?   Mouth: Mucous membranes are moist.  ?Eyes:  ?   General: No scleral icterus. ?   Conjunctiva/sclera: Conjunctivae normal.  ?Neck:  ?   Trachea: No tracheal deviation.  ?Cardiovascular:  ?   Rate and Rhythm: Normal rate and regular rhythm.  ?   Pulses: Normal pulses.  ?   Heart sounds: Normal heart sounds. No murmur heard. ?  No friction rub. No gallop.  ?Pulmonary:  ?   Effort: Pulmonary effort is normal. No accessory muscle usage or respiratory distress.  ?   Breath sounds: Normal breath sounds.  ?   Comments: Dressing/packing left chest port site saturated with blood, and blood dripping/oozing from site.  ?Abdominal:  ?   General: There is no distension.  ?   Tenderness: There is no abdominal tenderness.  ?Genitourinary: ?   Comments: No cva tenderness. ?Musculoskeletal:     ?   General: No swelling.  ?  Cervical back: Neck supple.  ?Skin: ?   General: Skin is warm and dry.  ?   Findings: No rash.  ?   Comments: No petechia.   ?Neurological:  ?   Mental Status: He is alert.  ?   Comments: Alert, speech clear.   ?Psychiatric:     ?   Mood and Affect: Mood normal.  ? ? ?ED Results / Procedures / Treatments   ?Labs ?(all labs ordered are listed, but only abnormal results are displayed) ?Labs Reviewed - No data to display ? ?EKG ?None ? ?Radiology ?DG Chest Port 1 View ? ?Result Date: 08/07/2021 ?CLINICAL DATA:  Status post port removal EXAM: PORTABLE CHEST - 1 VIEW COMPARISON:  07/21/2021 FINDINGS: Cardiomediastinal silhouette and pulmonary vasculature are within normal limits. Severe emphysematous changes of the lungs again seen. No  focal airspace opacity to indicate pneumonia. Interval removal of left chest port. Vertebral augmentation changes seen in the lower thoracic and upper lumbar spine. IMPRESSION: No acute cardiopulmonary process. Electronically Signed   By: Miachel Roux M.D.   On: 08/07/2021 12:21   ? ?Procedures ?Procedures  ? ? ?Medications Ordered in ED ?Medications - No data to display ? ?ED Course/ Medical Decision Making/ A&P ?  ?                        ?Medical Decision Making ?Problems Addressed: ?Bleeding: acute illness or injury that poses a threat to life or bodily functions ?History of removal of tunneled central venous catheter (CVC) with port: acute illness or injury ?Thrombocytopenia ([place]): chronic illness or injury ? ?Amount and/or Complexity of Data Reviewed ?Independent Historian: EMS ?   Details: hx ?External Data Reviewed: labs and notes. ? ? ?Pressure held for 15 minutes.  ? ?Reviewed nursing notes and prior charts for additional history. External reports reviewed, recent OP note reviewed, prior labs reviewed - hx chronic thrombocytopenia. Additional hx from EMS. ? ?After pressure held, no persistent oozing or bleeding. New dressing placed. Observed for period of time in ED with no pressure applied to chest/site.  ? ?No recurrent bleeding on recheck x 3. No faintness or lightheadedness.  ? ?Pt indicates has f/u appt with his surgeon this Tuesday - will f/u then. ? ?Return precautions provided.  ? ? ? ? ? ? ? ?Final Clinical Impression(s) / ED Diagnoses ?Final diagnoses:  ?None  ? ? ?Rx / DC Orders ?ED Discharge Orders   ? ? None  ? ?  ? ? ?  ?Lajean Saver, MD ?08/09/21 [phone] ? ?

## 2021-08-09 NOTE — ED Notes (Signed)
Pt had one culture completed and then received all antibiotics. Admitting physician Manuella Ghazi notified, no need for additional cultures if antibiotics have been received.   ?

## 2021-08-09 NOTE — Sepsis Progress Note (Signed)
Sepsis protocol is being followed by [facility]. 

## 2021-08-09 NOTE — Assessment & Plan Note (Signed)
Continue chronic prednisone and Megace ?Protein shakes ?

## 2021-08-10 DIAGNOSIS — R6520 Severe sepsis without septic shock: Secondary | ICD-10-CM | POA: Diagnosis not present

## 2021-08-10 DIAGNOSIS — A419 Sepsis, unspecified organism: Secondary | ICD-10-CM | POA: Diagnosis not present

## 2021-08-10 LAB — BPAM PLATELET PHERESIS
Blood Product Expiration Date: 202303272359
ISSUE DATE / TIME: 202303251344
Unit Type and Rh: 7300

## 2021-08-10 LAB — CBC WITH DIFFERENTIAL/PLATELET
Basophils Absolute: 0 10*3/uL (ref 0.0–0.1)
Basophils Relative: 0 %
Blasts: 11 %
Eosinophils Absolute: 0 10*3/uL (ref 0.0–0.5)
Eosinophils Relative: 0 %
HCT: 17.7 % — ABNORMAL LOW (ref 39.0–52.0)
Hemoglobin: 5.5 g/dL — CL (ref 13.0–17.0)
Lymphocytes Relative: 8 %
Lymphs Abs: 1.1 10*3/uL (ref 0.7–4.0)
MCH: 27.2 pg (ref 26.0–34.0)
MCHC: 31.1 g/dL (ref 30.0–36.0)
MCV: 87.6 fL (ref 80.0–100.0)
Monocytes Absolute: 0.9 10*3/uL (ref 0.1–1.0)
Monocytes Relative: 6 %
Neutro Abs: 10.7 10*3/uL — ABNORMAL HIGH (ref 1.7–7.7)
Neutrophils Relative %: 75 %
Platelets: 56 10*3/uL — ABNORMAL LOW (ref 150–400)
RBC: 2.02 MIL/uL — ABNORMAL LOW (ref 4.22–5.81)
RDW: 18.6 % — ABNORMAL HIGH (ref 11.5–15.5)
WBC: 14.3 10*3/uL — ABNORMAL HIGH (ref 4.0–10.5)
nRBC: 1.9 % — ABNORMAL HIGH (ref 0.0–0.2)

## 2021-08-10 LAB — BASIC METABOLIC PANEL
Anion gap: 3 — ABNORMAL LOW (ref 5–15)
BUN: 44 mg/dL — ABNORMAL HIGH (ref 8–23)
CO2: 19 mmol/L — ABNORMAL LOW (ref 22–32)
Calcium: 8.1 mg/dL — ABNORMAL LOW (ref 8.9–10.3)
Chloride: 117 mmol/L — ABNORMAL HIGH (ref 98–111)
Creatinine, Ser: 1.99 mg/dL — ABNORMAL HIGH (ref 0.61–1.24)
GFR, Estimated: 32 mL/min — ABNORMAL LOW (ref >60)
Glucose, Bld: 115 mg/dL — ABNORMAL HIGH (ref 70–99)
Potassium: 4.1 mmol/L (ref 3.5–5.1)
Sodium: 139 mmol/L (ref 135–145)

## 2021-08-10 LAB — LACTIC ACID, PLASMA
Lactic Acid, Venous: 2.7 mmol/L (ref 0.5–1.9)
Lactic Acid, Venous: 1.2 mmol/L (ref 0.5–1.9)

## 2021-08-10 LAB — PREPARE PLATELET PHERESIS: Unit division: 0

## 2021-08-10 LAB — HEMOGLOBIN AND HEMATOCRIT, BLOOD
HCT: 17 % — ABNORMAL LOW (ref 39.0–52.0)
HCT: 24.8 % — ABNORMAL LOW (ref 39.0–52.0)
Hemoglobin: 5.1 g/dL — CL (ref 13.0–17.0)
Hemoglobin: 7.9 g/dL — ABNORMAL LOW (ref 13.0–17.0)

## 2021-08-10 LAB — MRSA NEXT GEN BY PCR, NASAL: MRSA by PCR Next Gen: NOT DETECTED

## 2021-08-10 LAB — MAGNESIUM: Magnesium: 1.8 mg/dL (ref 1.7–2.4)

## 2021-08-10 NOTE — Progress Notes (Signed)
Went to evaluate patient's left port site wound.  There was no strikethrough on the packing.  Packing was removed, and there was a small amount of bleeding at the lateral aspect of the wound.  This was controlled with silver nitrate.  The wound was packed with quick clot.  I discussed with the nurse that if the area begins to bleed, recommend changing the packing and holding pressure for at least 10 to 15 minutes.  With dressing changes, the patient will likely need pressure to be held for at least 5 minutes, as patient is thrombocytopenic, and has more difficulty with clotting.  Patient currently receiving blood for hemoglobin of 5.1.  Recommend daily dressing changes with damp gauze.  Please call with any questions or concerns. ? ?Graciella Freer, DO ?[facility] ?[place],  [id number] ?[phone] (office) ? ?

## 2021-08-10 NOTE — Hospital Course (Signed)
Per HPI: ?KAZ AULD is a 88 y.o. male with medical history significant of myelodysplastic syndrome, COPD with chronic hypoxemia on 3 L nasal cannula, GERD, and CKD 3a who had removal of his left chest port on 3/23.  He was changing his dressing yesterday evening when he noticed some persistent oozing and bleeding from the site.  He contacted EMS who brought him to the ED for further evaluation.  Pressure was held in the ED with no further oozing or bleeding noted and he was observed in the ED and eventually discharged.  On his way home on the EMS truck, he became short of breath and was brought back to the ED for further evaluation.  He does describe some chills and weakness at baseline.  He denies any fevers, chest pain, cough, or congestion. ? ?08/10/21: Patient has been admitted with concerns of severe sepsis and has been started on IV antibiotics empirically.  Blood cultures are currently pending.  He has required 1 unit of platelet transfusion and has had no further bleeding noted at his port site.  His AKI is improving.  He is noted to have severe anemia requiring 2 unit PRBC transfusion today.  No overt bleeding noted. ?

## 2021-08-10 NOTE — Progress Notes (Signed)
?PROGRESS NOTE ? ? ? ?[name]  HAL:[id number] DOB: 05-Apr-1930 DOA: 08/09/2021 ?PCP: Sharilyn Sites, MD ? ? ?Brief Narrative:  ?Per HPI: ?[name] is a 88 y.o. male with medical history significant of myelodysplastic syndrome, COPD with chronic hypoxemia on 3 L nasal cannula, GERD, and CKD 3a who had removal of his left chest port on 3/23.  He was changing his dressing yesterday evening when he noticed some persistent oozing and bleeding from the site.  He contacted EMS who brought him to the ED for further evaluation.  Pressure was held in the ED with no further oozing or bleeding noted and he was observed in the ED and eventually discharged.  On his way home on the EMS truck, he became short of breath and was brought back to the ED for further evaluation.  He does describe some chills and weakness at baseline.  He denies any fevers, chest pain, cough, or congestion. ? ?08/10/21: Patient has been admitted with concerns of severe sepsis and has been started on IV antibiotics empirically.  Blood cultures are currently pending.  He has required 1 unit of platelet transfusion and has had no further bleeding noted at his port site.  His AKI is improving.  He is noted to have severe anemia requiring 2 unit PRBC transfusion today.  No overt bleeding noted.  ? ? ?Assessment & Plan: ?  ?Principal Problem: ?  Severe sepsis ([place]) ?Active Problems: ?  Myelodysplastic syndrome ([place]) ?  GERD (gastroesophageal reflux disease) ?  COPD (chronic obstructive pulmonary disease) ([place]) ?  Protein-calorie malnutrition, moderate ([place]) ?  AKI (acute kidney injury) ([place]) ? ?Assessment and Plan: ?* Severe sepsis ([place]) ?Patient noted to have SIRS criteria with significant leukocytosis as well as lactic acid elevation ?-Leukocytosis and lactic acidosis resolving ?IV fluid bolus and maintain on aggressive IV fluid  ?Continue vancomycin, Flagyl, and cefepime for now ?Check procalcitonin ?Unfortunately, only 1 set of blood culture  obtained prior to antibiotic initiation in the ED ?No other obvious source of infection ?Port culture and Gram stain negative ?-Cultures pending ? ?Myelodysplastic syndrome ([place]) ?Noted to have low platelet count as well as anemia which will need to be monitored ?Significant leukocytosis for which procalcitonin and differential will be ordered, patient is on chronic prednisone ?Transfuse platelets to keep above 50,000 ?Discussed case with Dr. Delton Coombes 3/25 ?-Transfused 2 unit PRBCs on 3/26 ? ?AKI (acute kidney injury) ([place]) ?On CKD stage III A ?Baseline creatinine near 1.3 ?Continue to hydrate and monitor ?Strict I's and O's ?Avoid nephrotoxic agents ?-Improving, continue to monitor ? ?Protein-calorie malnutrition, moderate ([place]) ?Continue chronic prednisone and Megace ?Protein shakes ? ?COPD (chronic obstructive pulmonary disease) ([place]) ?Currently without any significant bronchospasms ?DuoNebs as needed ?Has chronic hypoxemia on 3 L nasal cannula ? ?GERD (gastroesophageal reflux disease) ?Continue PPI ? ? ? ?DVT prophylaxis: SCDs ?Code Status: DNR ?Family Communication: Discussed with daughter on phone 3/26 ?Disposition Plan:  ?Status is: Inpatient ?Remains inpatient appropriate because: Need for transfusions and IV medications. ? ? ?Consultants:  ?Discussed with Dr. Delton Coombes 3/25 ? ?Procedures:  ?See below ? ?Antimicrobials:  ?Anti-infectives (From admission, onward)  ? ? Start     Dose/Rate Route Frequency Ordered Stop  ? 08/11/21 0600  vancomycin (VANCOCIN) IVPB 1000 mg/200 mL premix       ? 1,000 mg ?200 mL/hr over 60 Minutes Intravenous Every 48 hours 08/09/21 1349    ? 08/10/21 1000  ceFEPIme (MAXIPIME) 2 g in sodium chloride 0.9 % 100  mL IVPB       ? 2 g ?200 mL/hr over 30 Minutes Intravenous Every 24 hours 08/09/21 1345    ? 08/09/21 1800  metroNIDAZOLE (FLAGYL) IVPB 500 mg       ? 500 mg ?100 mL/hr over 60 Minutes Intravenous Every 12 hours 08/09/21 1144    ? 08/09/21 1000  ceFEPIme (MAXIPIME) 2 g in  sodium chloride 0.9 % 100 mL IVPB       ? 2 g ?200 mL/hr over 30 Minutes Intravenous  Once 08/09/21 0950 08/09/21 1054  ? 08/09/21 1000  metroNIDAZOLE (FLAGYL) IVPB 500 mg       ? 500 mg ?100 mL/hr over 60 Minutes Intravenous  Once 08/09/21 0950 08/09/21 1125  ? 08/09/21 1000  vancomycin (VANCOCIN) IVPB 1000 mg/200 mL premix       ? 1,000 mg ?200 mL/hr over 60 Minutes Intravenous  Once 08/09/21 0950 08/09/21 1125  ? ?  ? ? ?Subjective: ?Patient seen and evaluated today with no new acute complaints or concerns. No acute concerns or events noted overnight. ? ?Objective: ?Vitals:  ? 08/10/21 0930 08/10/21 0945 08/10/21 1000 08/10/21 1015  ?BP: (!) 105/46 (!) 93/45 (!) 95/45 (!) 90/52  ?Pulse:      ?Resp: 17 19    ?Temp:    97.7 ?F (36.5 ?C)  ?TempSrc:    Oral  ?SpO2:      ?Weight:      ?Height:      ? ? ?Intake/Output Summary (Last 24 hours) at 08/10/2021 1049 ?Last data filed at 08/10/2021 1015 ?Gross per 24 hour  ?Intake 3382.64 ml  ?Output 1200 ml  ?Net 2182.64 ml  ? ?Filed Weights  ? 08/09/21 0736 08/09/21 1325 08/10/21 0500  ?Weight: 53 kg 51.6 kg 54.1 kg  ? ? ?Examination: ? ?General exam: Appears calm and comfortable  ?Respiratory system: Clear to auscultation. Respiratory effort normal. On [place]. ?Cardiovascular system: S1 & S2 heard, RRR.  ?Gastrointestinal system: Abdomen is soft ?Central nervous system: Alert and awake ?Extremities: No edema ?Skin: No significant lesions noted ?Psychiatry: Flat affect. ? ? ? ?Data Reviewed: I have personally reviewed following labs and imaging studies ? ?CBC: ?Recent Labs  ?Lab 08/04/21 ?2130 08/07/21 ?8657 08/09/21 ?8469 08/09/21 ?6295 08/10/21 ?0327 08/10/21 ?0701  ?WBC 13.5* 13.3* 25.4* 40.9* 14.3*  --   ?NEUTROABS  --   --  15.7* 28.6* 10.7*  --   ?HGB 8.3* 8.9* 5.6*  9.2* 7.8* 5.5* 5.1*  ?HCT 26.2* 28.4* 19.8*  27.0* 25.5* 17.7* 17.0*  ?MCV 89.1 87.4 96.6 91.7 87.6  --   ?PLT 66* 47* 33* 45* 56*  --   ? ?Basic Metabolic Panel: ?Recent Labs  ?Lab 08/09/21 ?0809  08/09/21 ?1144 08/10/21 ?0327  ?NA 138 138 139  ?K 4.6 3.8 4.1  ?CL 116* 117* 117*  ?CO2  --  13* 19*  ?GLUCOSE 107* 156* 115*  ?BUN 53* 50* 44*  ?CREATININE 2.30* 2.13* 1.99*  ?CALCIUM  --  8.3* 8.1*  ?MG  --   --  1.8  ? ?GFR: ?Estimated Creatinine Clearance: 19.3 mL/min (A) (by C-G formula based on SCr of 1.99 mg/dL (H)). ?Liver Function Tests: ?Recent Labs  ?Lab 08/09/21 ?1144  ?AST 24  ?ALT 25  ?ALKPHOS 59  ?BILITOT 0.4  ?PROT 5.9*  ?ALBUMIN 2.7*  ? ?No results for input(s): LIPASE, AMYLASE in the last 168 hours. ?No results for input(s): AMMONIA in the last 168 hours. ?Coagulation Profile: ?No results for input(s): INR, PROTIME  in the last 168 hours. ?Cardiac Enzymes: ?No results for input(s): CKTOTAL, CKMB, CKMBINDEX, TROPONINI in the last 168 hours. ?BNP (last 3 results) ?No results for input(s): PROBNP in the last 8760 hours. ?HbA1C: ?No results for input(s): HGBA1C in the last 72 hours. ?CBG: ?No results for input(s): GLUCAP in the last 168 hours. ?Lipid Profile: ?No results for input(s): CHOL, HDL, LDLCALC, TRIG, CHOLHDL, LDLDIRECT in the last 72 hours. ?Thyroid Function Tests: ?No results for input(s): TSH, T4TOTAL, FREET4, T3FREE, THYROIDAB in the last 72 hours. ?Anemia Panel: ?No results for input(s): VITAMINB12, FOLATE, FERRITIN, TIBC, IRON, RETICCTPCT in the last 72 hours. ?Sepsis Labs: ?Recent Labs  ?Lab 08/09/21 ?1027 08/09/21 ?1144 08/09/21 ?1842 08/10/21 ?0327  ?PROCALCITON  --  0.25  --   --   ?LATICACIDVEN 3.3*  --  2.7* 1.2  ? ? ?Recent Results (from the past 240 hour(s))  ?Culture, blood (routine x 2)     Status: None (Preliminary result)  ? Collection Time: 08/07/21 10:47 AM  ? Specimen: BLOOD  ?Result Value Ref Range Status  ? Specimen Description BLOOD BLOOD LEFT FOREARM  Final  ? Special Requests   Final  ?  BOTTLES DRAWN AEROBIC AND ANAEROBIC Blood Culture adequate volume  ? Culture   Final  ?  NO GROWTH 2 DAYS ?Performed at [facility], [address], [place], [place] [address] ?  ?  Report Status PENDING  Incomplete  ?Culture, blood (routine x 2)     Status: None (Preliminary result)  ? Collection Time: 08/07/21 10:57 AM  ? Specimen: BLOOD  ?Result Value Ref Range Status  ? Specimen Description BLOO

## 2021-08-11 ENCOUNTER — Inpatient Hospital Stay (HOSPITAL_COMMUNITY): Payer: Medicare Other

## 2021-08-11 ENCOUNTER — Other Ambulatory Visit (HOSPITAL_COMMUNITY): Payer: Medicare Other

## 2021-08-11 DIAGNOSIS — R6520 Severe sepsis without septic shock: Secondary | ICD-10-CM | POA: Diagnosis not present

## 2021-08-11 DIAGNOSIS — D469 Myelodysplastic syndrome, unspecified: Secondary | ICD-10-CM | POA: Diagnosis not present

## 2021-08-11 DIAGNOSIS — J449 Chronic obstructive pulmonary disease, unspecified: Secondary | ICD-10-CM | POA: Diagnosis not present

## 2021-08-11 DIAGNOSIS — J9621 Acute and chronic respiratory failure with hypoxia: Secondary | ICD-10-CM | POA: Diagnosis not present

## 2021-08-11 DIAGNOSIS — D500 Iron deficiency anemia secondary to blood loss (chronic): Secondary | ICD-10-CM | POA: Diagnosis not present

## 2021-08-11 DIAGNOSIS — A419 Sepsis, unspecified organism: Secondary | ICD-10-CM | POA: Diagnosis not present

## 2021-08-11 LAB — MAGNESIUM: Magnesium: 1.8 mg/dL (ref 1.7–2.4)

## 2021-08-11 LAB — BASIC METABOLIC PANEL
Anion gap: 3 — ABNORMAL LOW (ref 5–15)
BUN: 40 mg/dL — ABNORMAL HIGH (ref 8–23)
CO2: 19 mmol/L — ABNORMAL LOW (ref 22–32)
Calcium: 8.3 mg/dL — ABNORMAL LOW (ref 8.9–10.3)
Chloride: 116 mmol/L — ABNORMAL HIGH (ref 98–111)
Creatinine, Ser: 1.72 mg/dL — ABNORMAL HIGH (ref 0.61–1.24)
GFR, Estimated: 38 mL/min — ABNORMAL LOW (ref >60)
Glucose, Bld: 89 mg/dL (ref 70–99)
Potassium: 3.8 mmol/L (ref 3.5–5.1)
Sodium: 138 mmol/L (ref 135–145)

## 2021-08-11 LAB — CBC
HCT: 24.4 % — ABNORMAL LOW (ref 39.0–52.0)
Hemoglobin: 7.9 g/dL — ABNORMAL LOW (ref 13.0–17.0)
MCH: 27.5 pg (ref 26.0–34.0)
MCHC: 32.4 g/dL (ref 30.0–36.0)
MCV: 85 fL (ref 80.0–100.0)
Platelets: 41 10*3/uL — ABNORMAL LOW (ref 150–400)
RBC: 2.87 MIL/uL — ABNORMAL LOW (ref 4.22–5.81)
RDW: 17.8 % — ABNORMAL HIGH (ref 11.5–15.5)
WBC: 10.7 10*3/uL — ABNORMAL HIGH (ref 4.0–10.5)
nRBC: 2.6 % — ABNORMAL HIGH (ref 0.0–0.2)

## 2021-08-11 LAB — BPAM RBC
Blood Product Expiration Date: 202304182359
Blood Product Expiration Date: 202304182359
ISSUE DATE / TIME: 202303260750
ISSUE DATE / TIME: 202303261103
Unit Type and Rh: 1700
Unit Type and Rh: 1700

## 2021-08-11 LAB — TYPE AND SCREEN
ABO/RH(D): B NEG
Antibody Screen: NEGATIVE
Unit division: 0
Unit division: 0

## 2021-08-11 MED ORDER — MIDODRINE HCL 5 MG PO TABS
5.0000 mg | ORAL_TABLET | Freq: Three times a day (TID) | ORAL | Status: DC
  Administered 2021-08-11 (×2): 5 mg via ORAL
  Filled 2021-08-11: qty 1

## 2021-08-11 MED ORDER — CEFDINIR 300 MG PO CAPS: 300.0000 mg | ORAL_CAPSULE | Freq: Two times a day (BID) | ORAL | 0 refills | Status: DC

## 2021-08-11 MED ORDER — SODIUM CHLORIDE 0.9% IV SOLUTION: Freq: Once | INTRAVENOUS | Status: DC

## 2021-08-11 MED ORDER — MIDODRINE HCL 5 MG PO TABS: 5.0000 mg | ORAL_TABLET | Freq: Three times a day (TID) | ORAL | 1 refills | Status: AC

## 2021-08-11 MED ORDER — CEFDINIR 300 MG PO CAPS: 300.0000 mg | ORAL_CAPSULE | Freq: Every day | ORAL | 0 refills | Status: AC

## 2021-08-11 NOTE — TOC Transition Note (Signed)
Transition of Care (TOC) - CM/SW Discharge Note ? ? ?Patient Details  ?Name: [name] ?MRN: [id number] ?Date of Birth: Oct 22, 1929 ? ?Transition of Care (TOC) CM/SW Contact:  ?Iona Beard, LCSWA ?Phone Number: ?08/11/2021, 10:29 AM ? ? ?Clinical Narrative:    ?Pt is high risk for readmission. CSW spoke with pt in room to complete assessment. Pt states that he lives alone. Pt states that he is independent in completing his ADLs. Pt states that he can drive when needed. Pt states that he does not have [place], however per chart review pt was recently set up with [facility] services. CSW reached out to [facility] rep who states pt is active with them for [facility] PT/RN/SW services. CSW requested that MD place [facility] orders. Pt states that he has a walker, cane and 4 wheel scooter to use when needed. Pt also states he wears 3L O2 at home supplied through [employer]. TOC signing off.  ? ?Final next level of care: [place] ?Barriers to Discharge: No Barriers Identified ? ? ?Patient Goals and CMS Choice ?Patient states their goals for this hospitalization and ongoing recovery are:: Return home ?CMS Medicare.gov Compare Post Acute Care list provided to:: Patient ?Choice offered to / list presented to : Patient ? ?Discharge Placement ?  ?           ?  ?  ?  ?  ? ?Discharge Plan and Services ?In-house Referral: Clinical Social Work ?  ?Post Acute Care Choice: Home Health          ?  ?  ?  ?  ?  ?HH Arranged: Therapist, sports, PT ?[place] Agency: [place] ?Date HH Agency Contacted: 08/11/21 ?  ?Representative spoke with at [place]: Tommi Rumps ? ?Social Determinants of Health (SDOH) Interventions ?  ? ? ?Readmission Risk Interventions ? ?  08/11/2021  ? 10:26 AM 07/23/2021  ? 11:04 AM  ?Readmission Risk Prevention Plan  ?Transportation Screening Complete Complete  ?[place] or Home Care Consult  Complete  ?Social Work Consult for [place] Planning/Counseling  Complete  ?Palliative Care Screening  Complete  ?Medication  Review Press photographer) Complete Complete  ?[place] or Home Care Consult Complete   ?SW Recovery Care/Counseling Consult Complete   ?Palliative Care Screening Not Applicable   ?[place] Not Applicable   ? ? ? ? ? ?

## 2021-08-11 NOTE — Discharge Summary (Addendum)
Physician Discharge Summary  ?Thomas Vipond [name] NWG:[id number] DOB: [birth year] DOA: 08/09/2021 ? ?PCP: Sharilyn Sites, MD ? ?Admit date: 08/09/2021 ? ?Discharge date: 08/11/2021 ? ?Admitted From:Home ? ?Disposition:  Home ? ?Recommendations for Outpatient Follow-up:  ?Follow up with PCP in 1-2 weeks ?Follow-up with Dr. Delton Coombes as scheduled and repeat CBC in 1 week ?Continue home medications as prior ?Omnicef prescribed for 5 more days to complete total 7-day course of treatment ?Continue wound dressing changes daily with damp gauze and applied 10-15 minutes of pressure with each dressing change ? ?Home Health: None ? ?Equipment/Devices: None ? ?Discharge Condition:Stable ? ?CODE STATUS: DNR ? ?Diet recommendation: Heart Healthy ? ?Brief/Interim Summary: ?Per HPI: ?[name] is a 88 y.o. male with medical history significant of myelodysplastic syndrome, COPD with chronic hypoxemia on 3 L nasal cannula, GERD, and CKD 3a who had removal of his left chest port on 3/23.  He was changing his dressing yesterday evening when he noticed some persistent oozing and bleeding from the site.  He contacted EMS who brought him to the ED for further evaluation.  Pressure was held in the ED with no further oozing or bleeding noted and he was observed in the ED and eventually discharged.  On his way home on the EMS truck, he became short of breath and was brought back to the ED for further evaluation.  He does describe some chills and weakness at baseline.  He denies any fevers, chest pain, cough, or congestion. ?  ?08/10/21: Patient has been admitted with concerns of severe sepsis and has been started on IV antibiotics empirically.  Blood cultures are currently pending.  He has required 1 unit of platelet transfusion and has had no further bleeding noted at his port site.  His AKI is improving.  He is noted to have severe anemia requiring 2 unit PRBC transfusion today.  No overt bleeding noted.  ? ?08/11/2021: Patient has had no  growth in blood cultures and there does not appear to be an obvious source of infection.  He has completed 2 days of IV antibiotics and will remain on Omnicef for 5 more days to complete course of treatment empirically.  AKI has improved.  Anemia has improved after 2 unit PRBC transfusion.  He was still noted to have some thrombocytopenia with platelet counts of less than 50,000 for which he will receive 1 more unit of platelets prior to discharge today.  No significant bleeding otherwise noted at wound site from prior port.  He is stable for discharge. ? ?Discharge Diagnoses:  ?Principal Problem: ?  Severe sepsis ([place]) ?Active Problems: ?  Myelodysplastic syndrome ([place]) ?  GERD (gastroesophageal reflux disease) ?  COPD (chronic obstructive pulmonary disease) ([place]) ?  Protein-calorie malnutrition, moderate ([place]) ?  AKI (acute kidney injury) ([place]) ? ?Principal discharge diagnosis: Severe sepsis ruled out with no obvious source of infection.  Myelodysplastic syndrome with severe anemia and thrombocytopenia requiring transfusions.  Bleeding at port site with recent removal 3/23 now controlled.  AKI-resolved. ? ?Discharge Instructions ? ?Discharge Instructions   ? ? Diet - low sodium heart healthy   Complete by: As directed ?  ? Discharge wound care:   Complete by: As directed ?  ? Daily dressing changes with damp gauze.  Please apply 10 to 15 minutes of pressure at the site after each dressing change.  ? Increase activity slowly   Complete by: As directed ?  ? ?  ? ?Allergies as of 08/11/2021   ?No Known  Allergies ?  ? ?  ?Medication List  ?  ? ?STOP taking these medications   ? ?amoxicillin-clavulanate 875-125 MG tablet ?Commonly known as: Augmentin ?  ? ?  ? ?TAKE these medications   ? ?albuterol 108 (90 Base) MCG/ACT inhaler ?Commonly known as: VENTOLIN HFA ?Inhale 2 puffs into the lungs every 4 (four) hours as needed for wheezing or shortness of breath. ?  ?cefdinir 300 MG capsule ?Commonly known as: OMNICEF ?Take 1  capsule (300 mg total) by mouth daily for 5 days. ?  ?megestrol 400 MG/10ML suspension ?Commonly known as: MEGACE ?Take 10 mLs (400 mg total) by mouth 2 (two) times daily. ?  ?midodrine 5 MG tablet ?Commonly known as: PROAMATINE ?Take 1 tablet (5 mg total) by mouth 3 (three) times daily with meals. ?What changed:  ?medication strength ?how much to take ?  ?pantoprazole 40 MG tablet ?Commonly known as: Protonix ?Take 1 tablet (40 mg total) by mouth 2 (two) times daily. ?  ?predniSONE 5 MG tablet ?Commonly known as: DELTASONE ?Take 5 mg by mouth daily with breakfast. ?  ?silodosin 4 MG Caps capsule ?Commonly known as: RAPAFLO ?Take 4 mg by mouth at bedtime. ?  ? ?  ? ?  ?  ? ? ?  ?Discharge Care Instructions  ?(From admission, onward)  ?  ? ? ?  ? ?  Start     Ordered  ? 08/11/21 0000  Discharge wound care:       ?Comments: Daily dressing changes with damp gauze.  Please apply 10 to 15 minutes of pressure at the site after each dressing change.  ? 08/11/21 0959  ? ?  ?  ? ?  ? ? Follow-up Information   ? ? Sharilyn Sites, MD. Schedule an appointment as soon as possible for a visit in 1 week(s).   ?Specialty: Family Medicine ?Contact information: ?[address] ?[place] [address] ?[phone] ? ? ?  ?  ? ? Derek Jack, MD. Go to.   ?Specialty: Hematology ?Contact information: ?[address] ?[place] [address] ?[phone] ? ? ?  ?  ? ?  ?  ? ?  ? ?No Known Allergies ? ?Consultations: ?Discussed with Dr. Delton Coombes 3/25 ? ? ?Procedures/Studies: ?DG Chest Port 1 View ? ?Result Date: 08/09/2021 ?CLINICAL DATA:  PT c/o sob and bleeding at port site. Pt states he had his port removed several days ago and has had bleeding at the site. HX of CA and COPD. EXAM: PORTABLE CHEST 1 VIEW COMPARISON:  08/07/2021 and older exams. FINDINGS: Cardiac silhouette is normal in size. No mediastinal or hilar masses. Lungs are hyperexpanded. Bilateral interstitial thickening. Bilateral irregular reticular opacities  consistent with scarring. These findings are stable. No lung consolidation to suggest pneumonia and no convincing pulmonary edema. No pleural effusion or pneumothorax. Skeletal structures are demineralized. Several vertebral compression fractures are noted, 2 previously treated with vertebroplasty, also stable. IMPRESSION: 1. No acute cardiopulmonary disease. 2. Chronic lung abnormalities consistent with COPD and interstitial/parenchymal scarring. Electronically Signed   By: Lajean Manes M.D.   On: 08/09/2021 08:32  ? ?DG Chest Port 1 View ? ?Result Date: 08/07/2021 ?CLINICAL DATA:  Status post port removal EXAM: PORTABLE CHEST - 1 VIEW COMPARISON:  07/21/2021 FINDINGS: Cardiomediastinal silhouette and pulmonary vasculature are within normal limits. Severe emphysematous changes of the lungs again seen. No focal airspace opacity to indicate pneumonia. Interval removal of left chest port. Vertebral augmentation changes seen in the lower thoracic and upper lumbar spine.  IMPRESSION: No acute cardiopulmonary process. Electronically Signed   By: Miachel Roux M.D.   On: 08/07/2021 12:21  ? ?DG Chest Port 1 View ? ?Result Date: 07/21/2021 ?CLINICAL DATA:  Shortness of breath. EXAM: PORTABLE CHEST 1 VIEW COMPARISON:  July 12, 2021 FINDINGS: There is stable left-sided venous Port-A-Cath positioning. The lungs are hyperinflated. There is marked severity emphysematous lung disease with stable chronic, diffusely increased interstitial lung markings. Mild, stable atelectasis is seen within the bilateral lung bases. There is no evidence of a pleural effusion or pneumothorax. The heart size and mediastinal contours are within normal limits. Prior vertebroplasty is seen within the mid to lower thoracic spine and upper lumbar spine. IMPRESSION: Stable exam, without acute cardiopulmonary disease. Electronically Signed   By: Virgina Norfolk M.D.   On: 07/21/2021 23:21  ? ?DG Chest Port 1 View ? ?Result Date: 07/12/2021 ?CLINICAL  DATA:  Severe shortness of breath and weakness for 1 day. Near syncopal episode. COPD. EXAM: PORTABLE CHEST 1 VIEW COMPARISON:  06/16/2021 FINDINGS: Left-sided power port remains in appropriate position. Heart s

## 2021-08-11 NOTE — Discharge Planning (Signed)
Oncology Discharge Planning Note ? ?[place] [facility] ?Address: [name] [place], [place] [address] ?Hours of Operation:  8am - 5pm, Monday - Friday  ?Clinic Contact Information:  [phone] ? ?Oncology Care Team: ?Medical Oncologist:  Dr. Derek Jack ? ?Patient Details: ?Name:  [name], [name] ?MRN:   [id number] ?DOB:   1930-05-03 ?Reason for Current Admission: Severe sepsis ([place]) ? ?Discharge Planning Narrative: ?Discharge follow-up appointments for oncology are current and available on the AVS and MyChart.   ?Upon discharge from the hospital, hematology/oncology's post discharge plan of care for the outpatient setting is: 08/14/21 labs, OV and possible transfusion.  ? ?Tarrance Januszewski will be called within two business days after discharge to review hematology/oncology's plan of care for full understanding.   ? ?Outpatient Oncology Specific Care Only: ?Oncology appointment transportation needs addressed?:  not applicable ?Oncology medication management for symptom management addressed?:  not applicable ?Chemo Alert Card reviewed?:  not applicable ?Immunotherapy Alert Card reviewed?:  not applicable ? ?

## 2021-08-12 ENCOUNTER — Ambulatory Visit (INDEPENDENT_AMBULATORY_CARE_PROVIDER_SITE_OTHER): Payer: Medicare Other | Admitting: General Surgery

## 2021-08-12 ENCOUNTER — Encounter: Payer: Self-pay | Admitting: General Surgery

## 2021-08-12 ENCOUNTER — Other Ambulatory Visit: Payer: Self-pay

## 2021-08-12 VITALS — BP 120/73 | HR 47 | Temp 97.3°F | Resp 16 | Ht 72.0 in | Wt 123.0 lb

## 2021-08-12 DIAGNOSIS — T80219A Unspecified infection due to central venous catheter, initial encounter: Secondary | ICD-10-CM

## 2021-08-12 DIAGNOSIS — S21102A Unspecified open wound of left front wall of thorax without penetration into thoracic cavity, initial encounter: Secondary | ICD-10-CM

## 2021-08-12 LAB — CULTURE, BLOOD (ROUTINE X 2)
Culture: NO GROWTH
Culture: NO GROWTH
Special Requests: ADEQUATE
Special Requests: ADEQUATE

## 2021-08-12 LAB — BPAM PLATELET PHERESIS
Blood Product Expiration Date: 202303292359
ISSUE DATE / TIME: 202303271145
Unit Type and Rh: 5100

## 2021-08-12 LAB — AEROBIC/ANAEROBIC CULTURE W GRAM STAIN (SURGICAL/DEEP WOUND)
Culture: NORMAL
Gram Stain: NONE SEEN

## 2021-08-12 LAB — PREPARE PLATELET PHERESIS: Unit division: 0

## 2021-08-12 MED ORDER — OXYCODONE HCL 5 MG PO TABS: 5.0000 mg | ORAL_TABLET | ORAL | 0 refills | Status: DC | PRN

## 2021-08-12 NOTE — Patient Instructions (Signed)
Leave dressing in place. This dressing can stay on for 3-4 days. Will change it on Thursday and decide if we will do it again in the clinic versus having your friend change it. ? ? ? ?

## 2021-08-12 NOTE — Progress Notes (Signed)
[facility] ? ?Area not bleeding but he has not changed it at home yet. Packing removed. Tender. Edge bleeding on the skin, silver nitrate applied. ? ?BP 120/73   Pulse (!) 47   Temp (!) 97.3 ?F (36.3 ?C) (Other (Comment))   Resp 16   Ht 6' (1.829 m)   Wt 123 lb (55.8 kg)   SpO2 90%   BMI 16.68 kg/m?  ?Hydrofera blue placed into the left chest wound from the port, pressure dressing placed ? ? ?Patient s/p port removal for infected port. The cultures never returned with anything but he did have cellulitis forming over the area. Looks like he had a hematoma from his Stick last week and then continued to bleed. Required blood and admission.  ? ?Will replace the packing on Thursday in the office. ?Keep area covered. ?Dr. Delton Coombes seeing in the AM and getting labs. ? ? ?Leave dressing in place. This dressing can stay on for 3-4 days. Will change it on Thursday and decide if we will do it again in the clinic versus having your friend change it. ? ?Future Appointments  ?Date Time Provider [place]  ?08/14/2021  8:40 AM AP-ACAPA LAB AP-ACAPA None  ?08/14/2021  9:45 AM Derek Jack, MD AP-ACAPA None  ?08/14/2021 10:15 AM AP-ACAPA CHAIR 1 AP-ACAPA None  ?08/14/2021  1:45 PM Virl Cagey, MD RS-RS None  ?08/29/2021  2:00 PM Wert, Christena Deem, MD LBPU-RDS None  ?09/15/2021 10:45 AM Harvel Quale, MD NRE-NRE None  ? ?[name], MD ?[facility] ?[place],  [address] ?[phone] (office) ? ?

## 2021-08-13 ENCOUNTER — Telehealth (HOSPITAL_COMMUNITY): Payer: Self-pay | Admitting: *Deleted

## 2021-08-13 DIAGNOSIS — J449 Chronic obstructive pulmonary disease, unspecified: Secondary | ICD-10-CM | POA: Diagnosis not present

## 2021-08-13 DIAGNOSIS — J9621 Acute and chronic respiratory failure with hypoxia: Secondary | ICD-10-CM | POA: Diagnosis not present

## 2021-08-13 DIAGNOSIS — D469 Myelodysplastic syndrome, unspecified: Secondary | ICD-10-CM | POA: Diagnosis not present

## 2021-08-13 DIAGNOSIS — D696 Thrombocytopenia, unspecified: Secondary | ICD-10-CM | POA: Diagnosis not present

## 2021-08-13 DIAGNOSIS — D500 Iron deficiency anemia secondary to blood loss (chronic): Secondary | ICD-10-CM | POA: Diagnosis not present

## 2021-08-13 DIAGNOSIS — E440 Moderate protein-calorie malnutrition: Secondary | ICD-10-CM | POA: Diagnosis not present

## 2021-08-13 NOTE — Telephone Encounter (Signed)
[name] was contacted by telephone to verify understanding of discharge instructions status post their most recent discharge from the hospital on the date:  08/11/21.  Inpatient discharge AVS was re-reviewed with patient, along with cancer center appointments.  Verification of understanding for oncology specific follow-up was validated using the Teach Back method.   ? ?Transportation to appointments were confirmed for the patient as being self/caregiver. ? ?[name]?s questions were addressed to their satisfaction upon completion of this post discharge follow-up call for outpatient oncology.  ?

## 2021-08-14 ENCOUNTER — Inpatient Hospital Stay (HOSPITAL_BASED_OUTPATIENT_CLINIC_OR_DEPARTMENT_OTHER): Payer: Medicare Other | Admitting: Hematology

## 2021-08-14 ENCOUNTER — Inpatient Hospital Stay (HOSPITAL_COMMUNITY): Payer: Medicare Other

## 2021-08-14 ENCOUNTER — Other Ambulatory Visit (HOSPITAL_COMMUNITY): Payer: Medicare Other

## 2021-08-14 ENCOUNTER — Ambulatory Visit (INDEPENDENT_AMBULATORY_CARE_PROVIDER_SITE_OTHER): Payer: Medicare Other | Admitting: General Surgery

## 2021-08-14 VITALS — BP 105/64 | HR 94 | Temp 97.5°F | Resp 14 | Ht 72.0 in | Wt 118.0 lb

## 2021-08-14 VITALS — BP 102/63 | HR 92 | Temp 98.1°F | Resp 18 | Ht 72.0 in | Wt 118.3 lb

## 2021-08-14 DIAGNOSIS — D469 Myelodysplastic syndrome, unspecified: Secondary | ICD-10-CM

## 2021-08-14 DIAGNOSIS — D649 Anemia, unspecified: Secondary | ICD-10-CM

## 2021-08-14 DIAGNOSIS — N1832 Chronic kidney disease, stage 3b: Secondary | ICD-10-CM | POA: Diagnosis not present

## 2021-08-14 DIAGNOSIS — D696 Thrombocytopenia, unspecified: Secondary | ICD-10-CM | POA: Diagnosis not present

## 2021-08-14 DIAGNOSIS — T80219A Unspecified infection due to central venous catheter, initial encounter: Secondary | ICD-10-CM

## 2021-08-14 DIAGNOSIS — D61818 Other pancytopenia: Secondary | ICD-10-CM | POA: Diagnosis not present

## 2021-08-14 DIAGNOSIS — Z79899 Other long term (current) drug therapy: Secondary | ICD-10-CM | POA: Diagnosis not present

## 2021-08-14 DIAGNOSIS — Z841 Family history of disorders of kidney and ureter: Secondary | ICD-10-CM | POA: Diagnosis not present

## 2021-08-14 DIAGNOSIS — Z87891 Personal history of nicotine dependence: Secondary | ICD-10-CM | POA: Diagnosis not present

## 2021-08-14 DIAGNOSIS — J9621 Acute and chronic respiratory failure with hypoxia: Secondary | ICD-10-CM | POA: Diagnosis not present

## 2021-08-14 DIAGNOSIS — S21102A Unspecified open wound of left front wall of thorax without penetration into thoracic cavity, initial encounter: Secondary | ICD-10-CM

## 2021-08-14 DIAGNOSIS — D500 Iron deficiency anemia secondary to blood loss (chronic): Secondary | ICD-10-CM | POA: Diagnosis not present

## 2021-08-14 DIAGNOSIS — J449 Chronic obstructive pulmonary disease, unspecified: Secondary | ICD-10-CM | POA: Diagnosis not present

## 2021-08-14 DIAGNOSIS — E440 Moderate protein-calorie malnutrition: Secondary | ICD-10-CM | POA: Diagnosis not present

## 2021-08-14 LAB — CULTURE, BLOOD (ROUTINE X 2)
Culture: NO GROWTH
Special Requests: ADEQUATE

## 2021-08-14 LAB — SAMPLE TO BLOOD BANK

## 2021-08-14 LAB — CBC
HCT: 33.4 % — ABNORMAL LOW (ref 39.0–52.0)
Hemoglobin: 10.5 g/dL — ABNORMAL LOW (ref 13.0–17.0)
MCH: 27.1 pg (ref 26.0–34.0)
MCHC: 31.4 g/dL (ref 30.0–36.0)
MCV: 86.1 fL (ref 80.0–100.0)
Platelets: 45 10*3/uL — ABNORMAL LOW (ref 150–400)
RBC: 3.88 MIL/uL — ABNORMAL LOW (ref 4.22–5.81)
RDW: 18.2 % — ABNORMAL HIGH (ref 11.5–15.5)
WBC: 9.2 10*3/uL (ref 4.0–10.5)
nRBC: 2.1 % — ABNORMAL HIGH (ref 0.0–0.2)

## 2021-08-14 NOTE — Progress Notes (Signed)
No blood products needed today per MD ?

## 2021-08-14 NOTE — Progress Notes (Signed)
[facility] ? ?Hydrofera removed and replaced.  ? ?BP 105/64   Pulse 94   Temp (!) 97.5 ?F (36.4 ?C) (Oral)   Resp 14   Ht 6' (1.829 m)   Wt 118 lb (53.5 kg)   SpO2 94%   BMI 16.00 kg/m?  ?Area less tender, purple gray stain on the subcutaneous tissue, no erythema surrounding. ?Hydrofera replaced ? ?Patient s/p port removal and subsequent bleeding, will continue packing with hydrofera for now given the issues with skin bleeding and platelet count 45.  ? ?Change the hydrofera (purple sheet) on Sunday. You can dampen the area before removing the old one with saline. Expect to see purple/ greyish color on the tissue. If there is bleeding hold pressure. ? ?Replace the hydrofera after dampening it with saline to make it pliable. Place in wound. Cover with guaze and paper tape. ? ?I will see back next week to replace on Tuesday. ? ?Future Appointments  ?Date Time Provider [place]  ?08/19/2021  8:15 AM AP-ACAPA LAB AP-ACAPA None  ?08/19/2021  9:15 AM AP-ACAPA CHAIR 1 AP-ACAPA None  ?08/19/2021 10:30 AM Virl Cagey, MD RS-RS None  ?08/25/2021  9:10 AM AP-ACAPA LAB AP-ACAPA None  ?08/25/2021 10:15 AM AP-ACAPA CHAIR 1 AP-ACAPA None  ?08/29/2021  2:00 PM Wert, Christena Deem, MD LBPU-RDS None  ?09/01/2021  9:10 AM AP-ACAPA LAB AP-ACAPA None  ?09/01/2021 10:15 AM AP-ACAPA CHAIR 1 AP-ACAPA None  ?09/08/2021  8:10 AM AP-ACAPA LAB AP-ACAPA None  ?09/08/2021  8:15 AM AP-ACAPA CHAIR 1 AP-ACAPA None  ?09/08/2021  9:15 AM Derek Jack, MD AP-ACAPA None  ?09/15/2021 10:45 AM Harvel Quale, MD NRE-NRE None  ? ?[name], MD ?[facility] ?[place],  [address] ?[phone] (office) ? ? ?

## 2021-08-14 NOTE — Progress Notes (Signed)
? ?[facility] ?[address] ?[place],  [address] ? ? ?CLINIC:  ?Medical Oncology/Hematology ? ?PCP:  ?Sharilyn Sites, MD ?[address] / [place] [address]  ?[phone] ? ?REASON FOR VISIT:  ?Follow-up for MDS ? ?PRIOR THERAPY: Aranesp, Retacrit, intermittent PRBC transfusions ? ?CURRENT THERAPY: Azacitidine (initiated on 09/16/2020) ? ?INTERVAL HISTORY:  ?[name], a 88 y.o. male, returns for routine follow-up for his MDS. [name] was last seen on 07/28/2021. ? ?Today he reports feeling good. His energy has improved and his appetite is good. He is taking Megace, and he has not noticed a change in his appetite.  ? ?REVIEW OF SYSTEMS:  ?Review of Systems  ?Constitutional:  Negative for appetite change and fatigue.  ?All other systems reviewed and are negative. ? ?PAST MEDICAL/SURGICAL HISTORY:  ?Past Medical History:  ?Diagnosis Date  ? Bone cancer ([place])   ? Chronic kidney insufficiency   ? COPD (chronic obstructive pulmonary disease) ([place])   ? GERD (gastroesophageal reflux disease)   ? Pulmonary nodule   ? ?Past Surgical History:  ?Procedure Laterality Date  ? BACK SURGERY    ? BIOPSY  07/15/2021  ? Procedure: BIOPSY;  Surgeon: Harvel Quale, MD;  [facility];  Service: Gastroenterology;;  ? CATARACT EXTRACTION W/PHACO Right 03/09/2016  ? Procedure: CATARACT EXTRACTION PHACO AND INTRAOCULAR LENS PLACEMENT RIGHT EYE CDE=8.58;  Surgeon: Tonny Branch, MD;  Location: [facility];  Service: Ophthalmology;  Laterality: Right;  right  ? CATARACT EXTRACTION W/PHACO [name] 04/13/2016  ? Procedure: CATARACT EXTRACTION PHACO AND INTRAOCULAR LENS PLACEMENT (IOC);  Surgeon: Tonny Branch, MD;  Location: [facility];  Service: Ophthalmology;  Laterality: [name];  CDE: 8.08  ? ENTEROSCOPY  07/23/2021  ? Procedure: ENTEROSCOPY;  Surgeon: Harvel Quale, MD;  Location: [facility];  Service: Gastroenterology;;  ? ESOPHAGOGASTRODUODENOSCOPY (EGD) WITH PROPOFOL N/A 07/14/2021  ?  Procedure: ESOPHAGOGASTRODUODENOSCOPY (EGD) WITH PROPOFOL;  Surgeon: Eloise Harman, DO;  Location[facility];  Service: Endoscopy;  Laterality: N/A;  ? ESOPHAGOGASTRODUODENOSCOPY (EGD) WITH PROPOFOL N/A 07/15/2021  ? Procedure: ESOPHAGOGASTRODUODENOSCOPY (EGD) WITH PROPOFOL;  Surgeon: Harvel Quale, MD;  [facility];  Service: Gastroenterology;  Laterality: N/A;  ? ESOPHAGOGASTRODUODENOSCOPY (EGD) WITH PROPOFOL N/A 07/23/2021  ? Procedure: ESOPHAGOGASTRODUODENOSCOPY (EGD) WITH PROPOFOL;  Surgeon: Harvel Quale, MD;  [facility]: [facility];  Service: Gastroenterology;  Laterality: N/A;  ? HEMOSTASIS CLIP PLACEMENT  07/15/2021  ? Procedure: HEMOSTASIS CLIP PLACEMENT;  Surgeon: Harvel Quale, MD;  Location: [facility];  Service: Gastroenterology;;  ? HEMOSTASIS CLIP PLACEMENT  07/23/2021  ? Procedure: HEMOSTASIS CLIP PLACEMENT;  Surgeon: Harvel Quale, MD;  Location: [facility];  Service: Gastroenterology;;  ? KYPHOPLASTY Bilateral 06/09/2019  ? Procedure: T11 KYPHOPLASTY;  Surgeon: Consuella Lose, MD;  Location: [facility];  Service: Neurosurgery;  Laterality: Bilateral;  ? None to Date  10/01/15  ? PORT-A-CATH REMOVAL [name] 08/07/2021  ? Procedure: MINOR REMOVAL PORT-A-CATH;  Surgeon: Virl Cagey, MD;  Location: [facility];  Service: General;  Laterality: [name];  lidocaine 1%  ? PORTACATH PLACEMENT [name] 10/11/2020  ? Procedure: INSERTION PORT-A-CATH (attached catheter in [name] internal jugular);  Surgeon: Virl Cagey, MD;  Location: [facility];  Service: General;  Laterality: [name];  ? ? ?SOCIAL HISTORY:  ?Social History  ? ?Socioeconomic History  ? Marital status: Widowed  ?  Spouse name: Not on file  ? Number of children: Not on file  ? Years of education: Not  on file  ? Highest education level: Not on file  ?Occupational History  ? Not on file  ?Tobacco Use  ? Smoking status: Former  ?  Years: 15.00  ?  Types: Cigarettes, Cigars  ?  Quit date:  10/01/1990  ?  Years since quitting: 30.8  ? Smokeless tobacco: Never  ? Tobacco comments:  ?  Quit x 25-30 years; 2 cigars daily when smoked  ?Vaping Use  ? Vaping Use: Never used  ?Substance and Sexual Activity  ? Alcohol use: Not Currently  ?  Alcohol/week: 0.0 standard drinks  ? Drug use: No  ? Sexual activity: Never  ?  Birth control/protection: None  ?Other Topics Concern  ? Not on file  ?Social History Narrative  ? Not on file  ? ?Social Determinants of Health  ? ?Financial Resource Strain: Not on file  ?Food Insecurity: Not on file  ?Transportation Needs: Not on file  ?Physical Activity: Not on file  ?Stress: Not on file  ?Social Connections: Not on file  ?Intimate Partner Violence: Not on file  ? ? ?FAMILY HISTORY:  ?Family History  ?Problem Relation Age of Onset  ? Kidney disease Mother   ? Kidney disease Sister   ? Colon cancer Neg Hx   ? Gastric cancer Neg Hx   ? Esophageal cancer Neg Hx   ? ? ?CURRENT MEDICATIONS:  ?Current Outpatient Medications  ?Medication Sig Dispense Refill  ? albuterol (VENTOLIN HFA) 108 (90 Base) MCG/ACT inhaler Inhale 2 puffs into the lungs every 4 (four) hours as needed for wheezing or shortness of breath. 18 g 2  ? cefdinir (OMNICEF) 300 MG capsule Take 1 capsule (300 mg total) by mouth daily for 5 days. 5 capsule 0  ? megestrol (MEGACE) 400 MG/10ML suspension Take 10 mLs (400 mg total) by mouth 2 (two) times daily. 480 mL 4  ? midodrine (PROAMATINE) 5 MG tablet Take 1 tablet (5 mg total) by mouth 3 (three) times daily with meals. 90 tablet 1  ? oxyCODONE (ROXICODONE) 5 MG immediate release tablet Take 1 tablet (5 mg total) by mouth every 4 (four) hours as needed for severe pain or breakthrough pain. 8 tablet 0  ? pantoprazole (PROTONIX) 40 MG tablet Take 1 tablet (40 mg total) by mouth 2 (two) times daily. 30 tablet 1  ? predniSONE (DELTASONE) 5 MG tablet Take 5 mg by mouth daily with breakfast.    ? silodosin (RAPAFLO) 4 MG CAPS capsule Take 4 mg by mouth at bedtime.    ? ?No  current facility-administered medications for this visit.  ? ?Facility-Administered Medications Ordered in Other Visits  ?Medication Dose Route Frequency Provider Last Rate Last Admin  ? 0.9 %  sodium chloride infusion   Intravenous Continuous Derek Jack, MD   Stopped at 01/06/21 1018  ? octreotide (SANDOSTATIN LAR) 30 MG IM injection           ? sodium chloride flush (NS) 0.9 % injection 10 mL  10 mL Intracatheter PRN Derek Jack, MD   10 mL at 01/06/21 1215  ? sodium chloride flush (NS) 0.9 % injection 10 mL  10 mL Intravenous PRN Derek Jack, MD   10 mL at 02/17/21 0902  ? sodium chloride flush (NS) 0.9 % injection 10 mL  10 mL Intravenous PRN Derek Jack, MD   10 mL at 03/10/21 0936  ? ? ?ALLERGIES:  ?No Known Allergies ? ?PHYSICAL EXAM:  ?Performance status (ECOG): 2 - Symptomatic, <50% confined to bed ? ?Vitals:  ?  08/14/21 0835  ?BP: 102/63  ?Pulse: 92  ?Resp: 18  ?Temp: 98.1 ?F (36.7 ?C)  ?SpO2: 91%  ? ?Wt Readings from Last 3 Encounters:  ?08/14/21 118 lb 5 oz (53.7 kg)  ?08/12/21 123 lb (55.8 kg)  ?08/10/21 119 lb 4.3 oz (54.1 kg)  ? ?Physical Exam ?Vitals reviewed.  ?Constitutional:   ?   Appearance: Normal appearance.  ?Cardiovascular:  ?   Rate and Rhythm: Normal rate and regular rhythm.  ?   Pulses: Normal pulses.  ?   Heart sounds: Normal heart sounds.  ?Pulmonary:  ?   Effort: Pulmonary effort is normal.  ?   Breath sounds: Normal breath sounds.  ?Musculoskeletal:  ?   Right lower leg: No edema.  ?   [name] lower leg: No edema.  ?Neurological:  ?   General: No focal deficit present.  ?   Mental Status: He is alert and oriented to person, place, and time.  ?Psychiatric:     ?   Mood and Affect: Mood normal.     ?   Behavior: Behavior normal.  ? ? ?LABORATORY DATA:  ?I have reviewed the labs as listed.  ? ?  Latest Ref Rng & Units 08/14/2021  ?  8:05 AM 08/11/2021  ?  4:33 AM 08/10/2021  ?  4:05 PM  ?CBC  ?WBC 4.0 - 10.5 K/uL 9.2   10.7     ?Hemoglobin 13.0 - 17.0 g/dL  10.5   7.9   7.9    ?Hematocrit 39.0 - 52.0 % 33.4   24.4   24.8    ?Platelets 150 - 400 K/uL 45   41     ? ? ?  Latest Ref Rng & Units 08/11/2021  ?  4:33 AM 08/10/2021  ?  3:27 AM 08/09/2021  ? 11:44 AM  ?CMP

## 2021-08-14 NOTE — Patient Instructions (Signed)
Change the hydrofera (purple sheet) on Sunday. You can dampen the area before removing the old one with saline. Expect to see purple/ greyish color on the tissue. If there is bleeding hold pressure. ? ?Replace the hydrofera after dampening it with saline to make it pliable. Place in wound. Cover with guaze and paper tape. ? ?

## 2021-08-15 DIAGNOSIS — J9621 Acute and chronic respiratory failure with hypoxia: Secondary | ICD-10-CM | POA: Diagnosis not present

## 2021-08-15 DIAGNOSIS — D696 Thrombocytopenia, unspecified: Secondary | ICD-10-CM | POA: Diagnosis not present

## 2021-08-15 DIAGNOSIS — J449 Chronic obstructive pulmonary disease, unspecified: Secondary | ICD-10-CM | POA: Diagnosis not present

## 2021-08-15 DIAGNOSIS — E440 Moderate protein-calorie malnutrition: Secondary | ICD-10-CM | POA: Diagnosis not present

## 2021-08-15 DIAGNOSIS — D469 Myelodysplastic syndrome, unspecified: Secondary | ICD-10-CM | POA: Diagnosis not present

## 2021-08-15 DIAGNOSIS — D500 Iron deficiency anemia secondary to blood loss (chronic): Secondary | ICD-10-CM | POA: Diagnosis not present

## 2021-08-16 ENCOUNTER — Encounter (HOSPITAL_COMMUNITY): Payer: Self-pay | Admitting: Hematology

## 2021-08-18 DIAGNOSIS — D469 Myelodysplastic syndrome, unspecified: Secondary | ICD-10-CM | POA: Diagnosis not present

## 2021-08-18 DIAGNOSIS — J9621 Acute and chronic respiratory failure with hypoxia: Secondary | ICD-10-CM | POA: Diagnosis not present

## 2021-08-18 DIAGNOSIS — E440 Moderate protein-calorie malnutrition: Secondary | ICD-10-CM | POA: Diagnosis not present

## 2021-08-18 DIAGNOSIS — D696 Thrombocytopenia, unspecified: Secondary | ICD-10-CM | POA: Diagnosis not present

## 2021-08-18 DIAGNOSIS — J449 Chronic obstructive pulmonary disease, unspecified: Secondary | ICD-10-CM | POA: Diagnosis not present

## 2021-08-18 DIAGNOSIS — D500 Iron deficiency anemia secondary to blood loss (chronic): Secondary | ICD-10-CM | POA: Diagnosis not present

## 2021-08-19 ENCOUNTER — Ambulatory Visit (INDEPENDENT_AMBULATORY_CARE_PROVIDER_SITE_OTHER): Payer: Medicare Other | Admitting: General Surgery

## 2021-08-19 ENCOUNTER — Other Ambulatory Visit: Payer: Self-pay

## 2021-08-19 ENCOUNTER — Encounter: Payer: Self-pay | Admitting: General Surgery

## 2021-08-19 ENCOUNTER — Inpatient Hospital Stay (HOSPITAL_COMMUNITY): Payer: Medicare Other

## 2021-08-19 ENCOUNTER — Inpatient Hospital Stay (HOSPITAL_COMMUNITY): Payer: Medicare Other | Attending: Hematology

## 2021-08-19 VITALS — BP 82/51 | HR 78 | Temp 97.6°F | Resp 20 | Ht 72.0 in | Wt 118.0 lb

## 2021-08-19 DIAGNOSIS — J449 Chronic obstructive pulmonary disease, unspecified: Secondary | ICD-10-CM | POA: Diagnosis not present

## 2021-08-19 DIAGNOSIS — J9621 Acute and chronic respiratory failure with hypoxia: Secondary | ICD-10-CM | POA: Diagnosis not present

## 2021-08-19 DIAGNOSIS — T80219A Unspecified infection due to central venous catheter, initial encounter: Secondary | ICD-10-CM

## 2021-08-19 DIAGNOSIS — D696 Thrombocytopenia, unspecified: Secondary | ICD-10-CM | POA: Diagnosis not present

## 2021-08-19 DIAGNOSIS — D469 Myelodysplastic syndrome, unspecified: Secondary | ICD-10-CM | POA: Diagnosis not present

## 2021-08-19 DIAGNOSIS — E440 Moderate protein-calorie malnutrition: Secondary | ICD-10-CM | POA: Diagnosis not present

## 2021-08-19 DIAGNOSIS — D61818 Other pancytopenia: Secondary | ICD-10-CM | POA: Diagnosis not present

## 2021-08-19 DIAGNOSIS — S21102A Unspecified open wound of left front wall of thorax without penetration into thoracic cavity, initial encounter: Secondary | ICD-10-CM

## 2021-08-19 DIAGNOSIS — D500 Iron deficiency anemia secondary to blood loss (chronic): Secondary | ICD-10-CM | POA: Diagnosis not present

## 2021-08-19 DIAGNOSIS — D649 Anemia, unspecified: Secondary | ICD-10-CM

## 2021-08-19 LAB — CBC
HCT: 31.5 % — ABNORMAL LOW (ref 39.0–52.0)
Hemoglobin: 9.8 g/dL — ABNORMAL LOW (ref 13.0–17.0)
MCH: 26.8 pg (ref 26.0–34.0)
MCHC: 31.1 g/dL (ref 30.0–36.0)
MCV: 86.1 fL (ref 80.0–100.0)
Platelets: 24 10*3/uL — CL (ref 150–400)
RBC: 3.66 MIL/uL — ABNORMAL LOW (ref 4.22–5.81)
RDW: 18.6 % — ABNORMAL HIGH (ref 11.5–15.5)
WBC: 6.9 10*3/uL (ref 4.0–10.5)
nRBC: 1.4 % — ABNORMAL HIGH (ref 0.0–0.2)

## 2021-08-19 LAB — SAMPLE TO BLOOD BANK

## 2021-08-19 NOTE — Patient Instructions (Addendum)
Change the dressing Wednesday 08/20/21 with the purple hydrofera. Continue to change this every 48 hours. If the area gets scabbed over can change to a dry dressing.  ? ?Will see 4/17 in [place] in front of [place].  ? ?Will talk to Dr. Delton Coombes about replacement port.  ?

## 2021-08-19 NOTE — Progress Notes (Signed)
CRITICAL VALUE ALERT ?Critical value received:  Platelets 24 ?Date of notification:  08/19/2021 ?Time of notification: 08:50 am ?Critical value read back:  Yes.   ?Nurse who received alert:  B. Sallye Lunz RN ?MD notified time and response:  Dr. Delton Coombes 08:55 am. Reported .   ?

## 2021-08-19 NOTE — Progress Notes (Signed)
Patient presents today for possible blood transfusion per providers order.  Hgb noted to be 9.8, patient does not need blood products at this time.  Discharge from clinic ambulatory in stable condition.  Alert and oriented X 3.  Follow up with [facility] as scheduled.  ?

## 2021-08-19 NOTE — Progress Notes (Signed)
[facility] ? ?Packing changed. Platelets in 20s today with check. No active bleeding. ? ?BP (!) 82/51   Pulse 78   Temp 97.6 ?F (36.4 ?C) (Oral)   Resp 20   Ht 6' (1.829 m)   Wt 118 lb (53.5 kg)   SpO2 96%   BMI 16.00 kg/m?  ?Area with some granulation and staining from the purple hydrofera ? ?Change the dressing Wednesday 08/20/21 with the purple hydrofera. Continue to change this every 48 hours. If the area gets scabbed over can change to a dry dressing.  ? ?Will see 4/17 in [place] in front of [place].  ? ?Will talk to Dr. Delton Coombes about replacement port.  Can try to do the week of April 17 or the next week.  ? ?Future Appointments  ?Date Time Provider [place]  ?08/25/2021  9:10 AM AP-ACAPA LAB AP-ACAPA None  ?08/25/2021 10:15 AM AP-ACAPA CHAIR 1 AP-ACAPA None  ?08/29/2021  2:00 PM Wert, Christena Deem, MD LBPU-RDS None  ?09/01/2021  9:10 AM AP-ACAPA LAB AP-ACAPA None  ?09/01/2021 10:15 AM AP-ACAPA CHAIR 1 AP-ACAPA None  ?09/01/2021  2:00 PM Virl Cagey, MD RS-RS None  ?09/08/2021  8:10 AM AP-ACAPA LAB AP-ACAPA None  ?09/08/2021  8:15 AM AP-ACAPA CHAIR 1 AP-ACAPA None  ?09/08/2021  9:15 AM Derek Jack, MD AP-ACAPA None  ?09/15/2021 10:45 AM Harvel Quale, MD NRE-NRE None  ? ?[name], MD ?[facility] ?[place], [place] [address] ?[phone] (office) ? ?

## 2021-08-19 NOTE — Patient Instructions (Signed)
[facility]  Discharge Instructions: ?Thank you for choosing [facility] to provide your oncology and hematology care.  ?If you have a lab appointment with the [facility], please come in thru the Main Entrance and check in at the main information desk. ? ?Wear comfortable clothing and clothing appropriate for easy access to any Portacath or PICC line.  ? ?We strive to give you quality time with your provider. You may need to reschedule your appointment if you arrive late (15 or more minutes).  Arriving late affects you and other patients whose appointments are after yours.  Also, if you miss three or more appointments without notifying the office, you may be dismissed from the clinic at the provider?s discretion.    ?  ?For prescription refill requests, have your pharmacy contact our office and allow 72 hours for refills to be completed.   ? ?Today you received the following chemotherapy and/or immunotherapy agents no blood products today    ?  ?To help prevent nausea and vomiting after your treatment, we encourage you to take your nausea medication as directed. ? ?BELOW ARE SYMPTOMS THAT SHOULD BE REPORTED IMMEDIATELY: ?*FEVER GREATER THAN 100.4 F (38 ?C) OR HIGHER ?*CHILLS OR SWEATING ?*NAUSEA AND VOMITING THAT IS NOT CONTROLLED WITH YOUR NAUSEA MEDICATION ?*UNUSUAL SHORTNESS OF BREATH ?*UNUSUAL BRUISING OR BLEEDING ?*URINARY PROBLEMS (pain or burning when urinating, or frequent urination) ?*BOWEL PROBLEMS (unusual diarrhea, constipation, pain near the anus) ?TENDERNESS IN MOUTH AND THROAT WITH OR WITHOUT PRESENCE OF ULCERS (sore throat, sores in mouth, or a toothache) ?UNUSUAL RASH, SWELLING OR PAIN  ?UNUSUAL VAGINAL DISCHARGE OR ITCHING  ? ?Items with * indicate a potential emergency and should be followed up as soon as possible or go to the Emergency Department if any problems should occur. ? ?Please show the CHEMOTHERAPY ALERT CARD or IMMUNOTHERAPY ALERT CARD at check-in to the  Emergency Department and triage nurse. ? ?Should you have questions after your visit or need to cancel or reschedule your appointment, please contact [facility] [phone]  and follow the prompts.  Office hours are 8:00 a.m. to 4:30 p.m. Monday - Friday. Please note that voicemails left after 4:00 p.m. may not be returned until the following business day.  We are closed weekends and major holidays. You have access to a nurse at all times for urgent questions. Please call the main number to the clinic [phone] and follow the prompts. ? ?For any non-urgent questions, you may also contact your provider using MyChart. We now offer e-Visits for anyone 90 and older to request care online for non-urgent symptoms. For details visit [email or web address]. ?  ?Also download the MyChart app! Go to the app store, search "MyChart", open the app, select [facility], and log in with your MyChart username and password. ? ?Due to Covid, a mask is required upon entering the hospital/clinic. If you do not have a mask, one will be given to you upon arrival. For doctor visits, patients may have 1 support person aged 70 or older with them. For treatment visits, patients cannot have anyone with them due to current Covid guidelines and our immunocompromised population.  ?

## 2021-08-20 DIAGNOSIS — D500 Iron deficiency anemia secondary to blood loss (chronic): Secondary | ICD-10-CM | POA: Diagnosis not present

## 2021-08-20 DIAGNOSIS — J449 Chronic obstructive pulmonary disease, unspecified: Secondary | ICD-10-CM | POA: Diagnosis not present

## 2021-08-20 DIAGNOSIS — J9621 Acute and chronic respiratory failure with hypoxia: Secondary | ICD-10-CM | POA: Diagnosis not present

## 2021-08-20 DIAGNOSIS — D696 Thrombocytopenia, unspecified: Secondary | ICD-10-CM | POA: Diagnosis not present

## 2021-08-20 DIAGNOSIS — D469 Myelodysplastic syndrome, unspecified: Secondary | ICD-10-CM | POA: Diagnosis not present

## 2021-08-20 DIAGNOSIS — E440 Moderate protein-calorie malnutrition: Secondary | ICD-10-CM | POA: Diagnosis not present

## 2021-08-23 ENCOUNTER — Other Ambulatory Visit: Payer: Self-pay

## 2021-08-23 ENCOUNTER — Emergency Department (HOSPITAL_COMMUNITY)
Admission: EM | Admit: 2021-08-23 | Discharge: 2021-08-23 | Disposition: A | Discharge status: Home / Self Care | Payer: Medicare Other | Attending: Emergency Medicine | Admitting: Emergency Medicine

## 2021-08-23 ENCOUNTER — Encounter (HOSPITAL_COMMUNITY): Payer: Self-pay

## 2021-08-23 DIAGNOSIS — D696 Thrombocytopenia, unspecified: Secondary | ICD-10-CM | POA: Diagnosis not present

## 2021-08-23 DIAGNOSIS — K1379 Other lesions of oral mucosa: Secondary | ICD-10-CM | POA: Diagnosis not present

## 2021-08-23 LAB — COMPREHENSIVE METABOLIC PANEL
ALT: 20 U/L (ref 0–44)
AST: 23 U/L (ref 15–41)
Albumin: 3.5 g/dL (ref 3.5–5.0)
Alkaline Phosphatase: 72 U/L (ref 38–126)
Anion gap: 8 (ref 5–15)
BUN: 42 mg/dL — ABNORMAL HIGH (ref 8–23)
CO2: 19 mmol/L — ABNORMAL LOW (ref 22–32)
Calcium: 9.6 mg/dL (ref 8.9–10.3)
Chloride: 112 mmol/L — ABNORMAL HIGH (ref 98–111)
Creatinine, Ser: 1.76 mg/dL — ABNORMAL HIGH (ref 0.61–1.24)
GFR, Estimated: 37 mL/min — ABNORMAL LOW (ref >60)
Glucose, Bld: 96 mg/dL (ref 70–99)
Potassium: 3.9 mmol/L (ref 3.5–5.1)
Sodium: 139 mmol/L (ref 135–145)
Total Bilirubin: 0.9 mg/dL (ref 0.3–1.2)
Total Protein: 7.7 g/dL (ref 6.5–8.1)

## 2021-08-23 LAB — TYPE AND SCREEN
ABO/RH(D): B NEG
Antibody Screen: NEGATIVE

## 2021-08-23 LAB — CBC
HCT: 28 % — ABNORMAL LOW (ref 39.0–52.0)
Hemoglobin: 8.7 g/dL — ABNORMAL LOW (ref 13.0–17.0)
MCH: 26.4 pg (ref 26.0–34.0)
MCHC: 31.1 g/dL (ref 30.0–36.0)
MCV: 85.1 fL (ref 80.0–100.0)
Platelets: 21 10*3/uL — CL (ref 150–400)
RBC: 3.29 MIL/uL — ABNORMAL LOW (ref 4.22–5.81)
RDW: 18.4 % — ABNORMAL HIGH (ref 11.5–15.5)
WBC: 5.8 10*3/uL (ref 4.0–10.5)
nRBC: 1.4 % — ABNORMAL HIGH (ref 0.0–0.2)

## 2021-08-23 MED ORDER — SODIUM CHLORIDE 0.9 % IV SOLN
10.0000 mL/h | Freq: Once | INTRAVENOUS | Status: AC
  Administered 2021-08-23: 10 mL/h via INTRAVENOUS

## 2021-08-23 NOTE — Discharge Instructions (Signed)
Make sure you see Dr. Delton Coombes on Monday. ?

## 2021-08-23 NOTE — ED Notes (Signed)
CRITICAL VALUE STICKER ? ?CRITICAL VALUE: PLT 21 ? ?RECEIVER (on-site recipient of call):I. Lacey Wallman ? ?DATE & TIME NOTIFIED: 08/23/21 1055 ? ?MESSENGER (representative from lab):Lab ? ?MD NOTIFIED:  Eulis Foster ? ?TIME OF NOTIFICATION:1055 ?RESPONSE:   ?

## 2021-08-23 NOTE — ED Triage Notes (Signed)
Pt arrives to ED via POV. Pt states he woke up this morning with areas on his tongue that were bleeding. Pt denies being on any blood thinners. AxOx4. Pt states his pcp was concerned that his platelets may be low again ?

## 2021-08-23 NOTE — ED Provider Notes (Signed)
?[facility] ?Provider Note ? ? ?CSN: [id number] ?Arrival date & time: 08/23/21  7824 ? ?  ? ?History ? ?Chief Complaint  ?Patient presents with  ? tongue bleeding   ? Abnormal Lab  ? ? ?[name] is a 88 y.o. male. ? ?HPI ?Patient presents for suspected thrombocytopenia, after consulting with his medical oncologist this morning.  He has sores on his tongue.  CBC checked, 4 days ago and platelets were 24,000.  He is receiving infusions for myelodysplastic syndrome. ?  ? ?Home Medications ?Prior to Admission medications   ?Medication Sig Start Date End Date Taking? Authorizing Provider  ?albuterol (VENTOLIN HFA) 108 (90 Base) MCG/ACT inhaler Inhale 2 puffs into the lungs every 4 (four) hours as needed for wheezing or shortness of breath. 04/04/20  Yes Roxan Hockey, MD  ?megestrol (MEGACE) 400 MG/10ML suspension Take 10 mLs (400 mg total) by mouth 2 (two) times daily. 07/31/21  Yes Derek Jack, MD  ?midodrine (PROAMATINE) 5 MG tablet Take 1 tablet (5 mg total) by mouth 3 (three) times daily with meals. 08/11/21 09/10/21 Yes Shah, Pratik D, DO  ?predniSONE (DELTASONE) 5 MG tablet Take 5 mg by mouth daily with breakfast.   Yes [provider]  ?silodosin (RAPAFLO) 4 MG CAPS capsule Take 4 mg by mouth at bedtime. 06/27/21  Yes [provider]  ?oxyCODONE (ROXICODONE) 5 MG immediate release tablet Take 1 tablet (5 mg total) by mouth every 4 (four) hours as needed for severe pain or breakthrough pain. ?Patient not taking: Reported on 08/23/2021 08/12/21   Virl Cagey, MD  ?pantoprazole (PROTONIX) 40 MG tablet Take 1 tablet (40 mg total) by mouth 2 (two) times daily. ?Patient not taking: Reported on 08/23/2021 07/16/21 07/16/22  Deatra James, MD  ?   ? ?Allergies    ?Patient has no known allergies.   ? ?Review of Systems   ?Review of Systems ? ?Physical Exam ?Updated Vital Signs ?BP (!) 102/51   Pulse 68   Temp 98.2 ?F (36.8 ?C)   Resp 19   Ht 6' (1.829 m)   Wt  53.5 kg   SpO2 95%   BMI 16.00 kg/m?  ?Physical Exam ?Vitals and nursing note reviewed.  ?Constitutional:   ?   General: He is not in acute distress. ?   Appearance: He is well-developed. He is not ill-appearing.  ?HENT:  ?   Head: Normocephalic and atraumatic.  ?   Right Ear: External ear normal.  ?   Left Ear: External ear normal.  ?   Nose: Nose normal.  ?   Mouth/Throat:  ?   Comments: Tongue has several blood blisters present which are not actively bleeding.  Oral mucous members are moist.  There is no sublingual swelling.  There is no occlusion of the posterior pharynx. ?Eyes:  ?   Conjunctiva/sclera: Conjunctivae normal.  ?   Pupils: Pupils are equal, round, and reactive to light.  ?Neck:  ?   Trachea: Phonation normal.  ?Cardiovascular:  ?   Rate and Rhythm: Normal rate and regular rhythm.  ?   Heart sounds: Normal heart sounds.  ?Pulmonary:  ?   Effort: Pulmonary effort is normal.  ?   Breath sounds: Normal breath sounds.  ?Abdominal:  ?   Palpations: Abdomen is soft.  ?   Tenderness: There is no abdominal tenderness.  ?Musculoskeletal:     ?   General: Normal range of motion.  ?   Cervical back: Normal range  of motion and neck supple.  ?Skin: ?   General: Skin is warm and dry.  ?Neurological:  ?   Mental Status: He is alert and oriented to person, place, and time.  ?   Cranial Nerves: No cranial nerve deficit.  ?   Sensory: No sensory deficit.  ?   Motor: No abnormal muscle tone.  ?   Coordination: Coordination normal.  ?Psychiatric:     ?   Mood and Affect: Mood normal.     ?   Behavior: Behavior normal.     ?   Thought Content: Thought content normal.     ?   Judgment: Judgment normal.  ? ? ?ED Results / Procedures / Treatments   ?Labs ?(all labs ordered are listed, but only abnormal results are displayed) ?Labs Reviewed  ?CBC - Abnormal; Notable for the following components:  ?    Result Value  ? RBC 3.29 (*)   ? Hemoglobin 8.7 (*)   ? HCT 28.0 (*)   ? RDW 18.4 (*)   ? Platelets 21 (*)   ? nRBC 1.4  (*)   ? All other components within normal limits  ?COMPREHENSIVE METABOLIC PANEL - Abnormal; Notable for the following components:  ? Chloride 112 (*)   ? CO2 19 (*)   ? BUN 42 (*)   ? Creatinine, Ser 1.76 (*)   ? GFR, Estimated 37 (*)   ? All other components within normal limits  ?TYPE AND SCREEN  ?PREPARE PLATELET PHERESIS  ? ? ?EKG ?None ? ?Radiology ?No results found. ? ?Procedures ?Procedures  ? ? ?Medications Ordered in ED ?Medications  ?0.9 %  sodium chloride infusion (0 mL/hr Intravenous Stopped 08/23/21 1525)  ? ? ?ED Course/ Medical Decision Making/ A&P ?  ?                        ?Medical Decision Making ?Resenting for oral lesions with known thrombocytopenia.  He is hemodynamically stable on arrival.  He denies tongue trauma. ? ?Problems Addressed: ?Thrombocytopenia ([place]): acute illness or injury that poses a threat to life or bodily functions ? ?Amount and/or Complexity of Data Reviewed ?Independent Historian:  ?   Details: He is a cogent historian ?External Data Reviewed: labs and notes. ?   Details: Recent evaluation by general surgery for infected port which was removed and is being treated with observation at this time.  Ongoing chemotherapy.  Abnormal CBC, done 4 days ago. ?Labs: ordered. ?   Details: CBC, metabolic panel-normal except hemoglobin low, platelets low, chloride low, CO2 high, BUN high, creatinine high ?Discussion of management or test interpretation with external provider(s): Case discussed with medical oncologist, Dr. Delton Coombes regarding thrombocytopenia and treatment. ? ?Risk ?Prescription drug management. ?Decision regarding hospitalization. ?Risk Details: Patient with complications from thrombocytopenia, oral blood blisters.  No active bleeding and hemodynamically stable.  Myelodysplasia is cause of thrombocytopenia.  It was treated with 1 unit of platelets in the ED to prevent complications.  Patient with chronic renal insufficiency, and stable chronic anemia.  No evidence for  acute infectious process.  Stable for discharge with close follow-up with his oncologist, in 2 days. ? ? ? ? ? ? ? ? ? ? ?Final Clinical Impression(s) / ED Diagnoses ?Final diagnoses:  ?Thrombocytopenia ([place])  ? ? ?Rx / DC Orders ?ED Discharge Orders   ? ? None  ? ?  ? ? ?  ?Daleen Bo, MD ?08/24/21 2127 ? ?

## 2021-08-24 LAB — BPAM PLATELET PHERESIS
Blood Product Expiration Date: 202304102359
ISSUE DATE / TIME: 202304081356
Unit Type and Rh: 7300

## 2021-08-24 LAB — PREPARE PLATELET PHERESIS: Unit division: 0

## 2021-08-25 ENCOUNTER — Other Ambulatory Visit (HOSPITAL_COMMUNITY): Payer: Self-pay

## 2021-08-25 ENCOUNTER — Inpatient Hospital Stay (HOSPITAL_COMMUNITY): Payer: Medicare Other

## 2021-08-25 DIAGNOSIS — D649 Anemia, unspecified: Secondary | ICD-10-CM

## 2021-08-25 DIAGNOSIS — D469 Myelodysplastic syndrome, unspecified: Secondary | ICD-10-CM

## 2021-08-25 DIAGNOSIS — E440 Moderate protein-calorie malnutrition: Secondary | ICD-10-CM | POA: Diagnosis not present

## 2021-08-25 DIAGNOSIS — J9621 Acute and chronic respiratory failure with hypoxia: Secondary | ICD-10-CM | POA: Diagnosis not present

## 2021-08-25 DIAGNOSIS — D696 Thrombocytopenia, unspecified: Secondary | ICD-10-CM | POA: Diagnosis not present

## 2021-08-25 DIAGNOSIS — D61818 Other pancytopenia: Secondary | ICD-10-CM | POA: Diagnosis not present

## 2021-08-25 DIAGNOSIS — J449 Chronic obstructive pulmonary disease, unspecified: Secondary | ICD-10-CM | POA: Diagnosis not present

## 2021-08-25 DIAGNOSIS — D500 Iron deficiency anemia secondary to blood loss (chronic): Secondary | ICD-10-CM | POA: Diagnosis not present

## 2021-08-25 LAB — CBC
HCT: 26.6 % — ABNORMAL LOW (ref 39.0–52.0)
Hemoglobin: 8.2 g/dL — ABNORMAL LOW (ref 13.0–17.0)
MCH: 26.6 pg (ref 26.0–34.0)
MCHC: 30.8 g/dL (ref 30.0–36.0)
MCV: 86.4 fL (ref 80.0–100.0)
Platelets: 43 10*3/uL — ABNORMAL LOW (ref 150–400)
RBC: 3.08 MIL/uL — ABNORMAL LOW (ref 4.22–5.81)
RDW: 18.6 % — ABNORMAL HIGH (ref 11.5–15.5)
WBC: 4.9 10*3/uL (ref 4.0–10.5)
nRBC: 2.2 % — ABNORMAL HIGH (ref 0.0–0.2)

## 2021-08-25 LAB — SAMPLE TO BLOOD BANK

## 2021-08-25 NOTE — Progress Notes (Signed)
No blood products today per MD order parameters.   ?

## 2021-08-28 DIAGNOSIS — E440 Moderate protein-calorie malnutrition: Secondary | ICD-10-CM | POA: Diagnosis not present

## 2021-08-28 DIAGNOSIS — J449 Chronic obstructive pulmonary disease, unspecified: Secondary | ICD-10-CM | POA: Diagnosis not present

## 2021-08-28 DIAGNOSIS — D469 Myelodysplastic syndrome, unspecified: Secondary | ICD-10-CM | POA: Diagnosis not present

## 2021-08-28 DIAGNOSIS — J9621 Acute and chronic respiratory failure with hypoxia: Secondary | ICD-10-CM | POA: Diagnosis not present

## 2021-08-28 DIAGNOSIS — D500 Iron deficiency anemia secondary to blood loss (chronic): Secondary | ICD-10-CM | POA: Diagnosis not present

## 2021-08-28 DIAGNOSIS — D696 Thrombocytopenia, unspecified: Secondary | ICD-10-CM | POA: Diagnosis not present

## 2021-08-29 ENCOUNTER — Encounter: Payer: Self-pay | Admitting: Internal Medicine

## 2021-08-29 ENCOUNTER — Ambulatory Visit (INDEPENDENT_AMBULATORY_CARE_PROVIDER_SITE_OTHER): Payer: Medicare Other | Admitting: Internal Medicine

## 2021-08-29 ENCOUNTER — Ambulatory Visit (HOSPITAL_COMMUNITY)
Admission: RE | Admit: 2021-08-29 | Discharge: 2021-08-29 | Disposition: A | Discharge status: Home / Self Care | Payer: Medicare Other | Source: Ambulatory Visit | Attending: Internal Medicine | Admitting: Internal Medicine

## 2021-08-29 DIAGNOSIS — J441 Chronic obstructive pulmonary disease with (acute) exacerbation: Secondary | ICD-10-CM | POA: Diagnosis not present

## 2021-08-29 DIAGNOSIS — J9811 Atelectasis: Secondary | ICD-10-CM | POA: Diagnosis not present

## 2021-08-29 DIAGNOSIS — R918 Other nonspecific abnormal finding of lung field: Secondary | ICD-10-CM

## 2021-08-29 DIAGNOSIS — R911 Solitary pulmonary nodule: Secondary | ICD-10-CM | POA: Diagnosis not present

## 2021-08-29 DIAGNOSIS — J449 Chronic obstructive pulmonary disease, unspecified: Secondary | ICD-10-CM

## 2021-08-29 DIAGNOSIS — E440 Moderate protein-calorie malnutrition: Secondary | ICD-10-CM | POA: Diagnosis not present

## 2021-08-29 DIAGNOSIS — D500 Iron deficiency anemia secondary to blood loss (chronic): Secondary | ICD-10-CM | POA: Diagnosis not present

## 2021-08-29 DIAGNOSIS — Z87891 Personal history of nicotine dependence: Secondary | ICD-10-CM | POA: Diagnosis not present

## 2021-08-29 DIAGNOSIS — N1832 Chronic kidney disease, stage 3b: Secondary | ICD-10-CM | POA: Diagnosis not present

## 2021-08-29 DIAGNOSIS — J439 Emphysema, unspecified: Secondary | ICD-10-CM | POA: Diagnosis not present

## 2021-08-29 DIAGNOSIS — A419 Sepsis, unspecified organism: Secondary | ICD-10-CM | POA: Diagnosis not present

## 2021-08-29 DIAGNOSIS — M1990 Unspecified osteoarthritis, unspecified site: Secondary | ICD-10-CM | POA: Diagnosis not present

## 2021-08-29 DIAGNOSIS — Z7952 Long term (current) use of systemic steroids: Secondary | ICD-10-CM | POA: Diagnosis not present

## 2021-08-29 DIAGNOSIS — R0600 Dyspnea, unspecified: Secondary | ICD-10-CM | POA: Diagnosis not present

## 2021-08-29 DIAGNOSIS — T80212A Local infection due to central venous catheter, initial encounter: Secondary | ICD-10-CM | POA: Diagnosis not present

## 2021-08-29 DIAGNOSIS — K219 Gastro-esophageal reflux disease without esophagitis: Secondary | ICD-10-CM | POA: Diagnosis not present

## 2021-08-29 DIAGNOSIS — Z9981 Dependence on supplemental oxygen: Secondary | ICD-10-CM | POA: Diagnosis not present

## 2021-08-29 DIAGNOSIS — Z9181 History of falling: Secondary | ICD-10-CM | POA: Diagnosis not present

## 2021-08-29 DIAGNOSIS — D469 Myelodysplastic syndrome, unspecified: Secondary | ICD-10-CM | POA: Diagnosis not present

## 2021-08-29 DIAGNOSIS — D696 Thrombocytopenia, unspecified: Secondary | ICD-10-CM | POA: Diagnosis not present

## 2021-08-29 MED ORDER — ANORO ELLIPTA 62.5-25 MCG/ACT IN AEPB: 1.0000 | INHALATION_SPRAY | Freq: Every day | RESPIRATORY_TRACT | 11 refills | Status: AC

## 2021-08-29 NOTE — Assessment & Plan Note (Addendum)
Quit smoking 1992 with mild vent defect 2004 per [facility] ?- echo 02/2021 nl LV/ no significant PH with PAS 31  ?- 08/29/2021  After extensive coaching inhaler device,  effectiveness =    90% with mdi > anoro trial  ?- 08/29/2021   Walked on RA  x  2  lap(s) =  approx 300  ft  @ moderate/cane pace, stopped due to sob with lowest 02 sats 90% s cp   ? ?Although no pfts are available, clinically mild/ mod and also limited by anemia/ condtioning and a low hc03 due to CRI so rec ? ?1) Pt is Group B in terms of symptom/risk and laba/lama therefore appropriate rx at this point >>>  anoro as rec also by Dr Hilma Favors but must use daily/ cautioned re bladder obst potential ? ?2) regular walking to MB and monitor sats at peak ex/ supplement if dropping < 90%  ? ?F/u in 3 months, no need for pfts at this point  ? ? ?Each maintenance medication was reviewed in detail including emphasizing most importantly the difference between maintenance and prns and under what circumstances the prns are to be triggered using an action plan format where appropriate. ? ?Total time for H and P, chart review, counseling, reviewing dpi device(s) , directly observing portions of ambulatory 02 saturation study/ and generating customized AVS unique to this office visit / same day charting  > 45 min with new pt   ?     ?  ?      ? ?  ?

## 2021-08-29 NOTE — Patient Instructions (Signed)
Anoro one click 1st thing each am then rinse and gargle and spit. ? ?Keep walking to mailbox - Make sure you check your oxygen saturation at your highest level of activity to be sure it stays over 90% and keep track of it at least once a week, more often if breathing getting worse, and let me know if losing ground.  ? ?See if you can walk better after transfusion compared to before it. ? ?May need to consider adding orocit to your med list to build up your bicarb but hold off on this for now  ? ?Please remember to go to the  x-ray department  @  [facility] for your tests - we will call you with the results when they are available ? ? ?Please schedule a follow up visit in 3 months but call sooner if needed  with all medications /inhalers/ solutions in hand so we can verify exactly what you are taking. This includes all medications from all doctors and over the counters     ?

## 2021-08-29 NOTE — Progress Notes (Signed)
? ?[name], male    DOB: [birth year],   MRN: [id number] ? ? ?Brief patient profile:  ?46   yowm quit smoking 1992  referred to pulmonary clinic in [place]  08/29/2021 by Dr [name]  for ? COPD  with "mild/moderate vent defect" in 2004 per [name]. ?Note Pulmonary eval 2020 in [place] rec spiriva ? Helped some.but then dx MDS and "all the focus went there" on Azacitidine at time of initial pulmonary eval s/p admit for ?  line sepsis though blood cultures were negative ? ?Admit date: 08/09/2021 ?  ?Discharge date: 08/11/2021 ?  ?Admitted From:Home ?  ?Disposition:  Home ?  ?Recommendations for Outpatient Follow-up:  ?Follow up with PCP in 1-2 weeks ?Follow-up with [name] as scheduled and repeat CBC in 1 week ?Continue home medications as prior ?Omnicef prescribed for 5 more days to complete total 7-day course of treatment ?Continue wound dressing changes daily with damp gauze and applied 10-15 minutes of pressure with each dressing change ?  ?Home Health: None ?  ?Equipment/Devices: None ?  ?Discharge Condition:Stable ?  ?CODE STATUS: DNR ?  ?Diet recommendation: Heart Healthy ?  ?Brief/Interim Summary: ?Per HPI: ?[name] is a 88 y.o. male with medical history significant of myelodysplastic syndrome, COPD with chronic hypoxemia on 3 L nasal cannula, GERD, and CKD 3a who had removal of his left chest port on 3/23.  He was changing his dressing  on day PTA when he noticed some persistent oozing and bleeding from the site.  He contacted EMS who brought him to the ED for further evaluation.  Pressure was held in the ED with no further oozing or bleeding noted and he was observed in the ED and eventually discharged.  On his way home on the EMS truck, he became short of breath and was brought back to the ED for further evaluation.  He does describe some chills and weakness at baseline.  He denies any fevers, chest pain, cough, or congestion. ?  ?08/10/21: Patient has been admitted with concerns of  severe sepsis and has been started on IV antibiotics empirically.  Blood cultures are currently pending.  He has required 1 unit of platelet transfusion and has had no further bleeding noted at his port site.  His AKI is improving.  He is noted to have severe anemia requiring 2 unit PRBC transfusion today.  No overt bleeding noted.  ?  ?08/11/2021: Patient has had no growth in blood cultures and there does not appear to be an obvious source of infection.  He has completed 2 days of IV antibiotics and will remain on Omnicef for 5 more days to complete course of treatment empirically.  AKI has improved.  Anemia has improved after 2 unit PRBC transfusion.  He was still noted to have some thrombocytopenia with platelet counts of less than 50,000 for which he will receive 1 more unit of platelets prior to discharge today.  No significant bleeding otherwise noted at wound site from prior port.  He is stable for discharge. ?  ?Discharge Diagnoses:  ?Principal Problem: ?  Severe sepsis ([place]) ?Active Problems: ?  Myelodysplastic syndrome ([place]) ?  GERD (gastroesophageal reflux disease) ?  COPD (chronic obstructive pulmonary disease) ([place]) ?  Protein-calorie malnutrition, moderate ([place]) ?  AKI (acute kidney injury) ([place]) ?  ?Principal discharge diagnosis: Severe sepsis ruled out with no obvious source of infection.  Myelodysplastic syndrome with severe anemia and thrombocytopenia requiring transfusions.  Bleeding at port site with recent  removal 3/23 now controlled.  AKI-resolved. ?  ?  ? ? ?History of Present Illness  ?08/29/2021  Pulmonary/ 1st office eval/ [name] / [facility] on pred 5 mg daily  ?Chief Complaint  ?Patient presents with  ? Consult  ?  Referred by Dr. Hilma [name] for COPD  ?Has been on oxygen since 2019/2020. Uses prn during day.   ?Dyspnea:  sev hundred feet slt incline to MB assoc with cp  ?Cough: none at time of ov  ?Sleep:  flat bed 2 pillows under head  ?SABA use: no benefit  ?02 prn not using with ex /  not checking sats either  ? ?No obvious day to day or daytime variability or assoc excess/ purulent sputum or mucus plugs or hemoptysis or cp or chest tightness, subjective wheeze or overt sinus or hb symptoms.  ? ?Sleeping as above  without nocturnal  or early am exacerbation  of respiratory  c/o's or need for noct saba. Also denies any obvious fluctuation of symptoms with weather or environmental changes or other aggravating or alleviating factors except as outlined above  ? ?No unusual exposure hx or h/o childhood pna/ asthma or knowledge of premature birth. ? ?Current Allergies, Complete Past Medical History, Past Surgical History, Family History, and Social History were reviewed in [employer] record. ? ?ROS  The following are not active complaints unless bolded ?Hoarseness, sore throat, dysphagia, dental problems, itching, sneezing,  nasal congestion or discharge of excess mucus or purulent secretions, ear ache,   fever, chills, sweats, unintended wt loss or wt gain, classically pleuritic or exertional cp,  orthopnea pnd or arm/hand swelling  or leg swelling, presyncope, palpitations, abdominal pain, anorexia, nausea, vomiting, diarrhea  or change in bowel habits or change in bladder habits, change in stools or change in urine, dysuria, hematuria,  rash, arthralgias, visual complaints, headache, numbness, weakness or ataxia or problems with walking/uses cane or coordination,  change in mood or  memory. ?      ?   ? ?Past Medical History:  ?Diagnosis Date  ? Bone cancer ([place])   ? Chronic kidney insufficiency   ? COPD (chronic obstructive pulmonary disease) ([place])   ? GERD (gastroesophageal reflux disease)   ? Pulmonary nodule   ? ? ?Outpatient Medications Prior to Visit  ?Medication Sig Dispense Refill  ? albuterol (VENTOLIN HFA) 108 (90 Base) MCG/ACT inhaler Inhale 2 puffs into the lungs every 4 (four) hours as needed for wheezing or shortness of breath. 18 g 2  ? megestrol (MEGACE) 400  MG/10ML suspension Take 10 mLs (400 mg total) by mouth 2 (two) times daily. 480 mL 4  ? midodrine (PROAMATINE) 5 MG tablet Take 1 tablet (5 mg total) by mouth 3 (three) times daily with meals. 90 tablet 1  ? oxyCODONE (ROXICODONE) 5 MG immediate release tablet Take 1 tablet (5 mg total) by mouth every 4 (four) hours as needed for severe pain or breakthrough pain. 8 tablet 0  ? pantoprazole (PROTONIX) 40 MG tablet Take 1 tablet (40 mg total) by mouth 2 (two) times daily. 30 tablet 1  ? predniSONE (DELTASONE) 5 MG tablet Take 5 mg by mouth daily with breakfast.    ? silodosin (RAPAFLO) 4 MG CAPS capsule Take 4 mg by mouth at bedtime.    ? ?Facility-Administered Medications Prior to Visit  ?Medication Dose Route Frequency Provider Last Rate Last Admin  ? 0.9 %  sodium chloride infusion   Intravenous Continuous Derek Jack, MD   Stopped  at 01/06/21 1018  ? octreotide (SANDOSTATIN LAR) 30 MG IM injection           ? sodium chloride flush (NS) 0.9 % injection 10 mL  10 mL Intracatheter PRN Derek Jack, MD   10 mL at 01/06/21 1215  ? sodium chloride flush (NS) 0.9 % injection 10 mL  10 mL Intravenous PRN Derek Jack, MD   10 mL at 02/17/21 0902  ? sodium chloride flush (NS) 0.9 % injection 10 mL  10 mL Intravenous PRN Derek Jack, MD   10 mL at 03/10/21 8242  ? ? ? ?Objective:  ?  ? ?BP 118/72 (BP Location: Left Arm, Patient Position: Sitting)   Pulse 68   Temp 98.7 ?F (37.1 ?C) (Temporal)   Ht '6\' 4"'$  (1.93 m)   Wt 118 lb 6.4 oz (53.7 kg)   SpO2 96% Comment: ra  BMI 14.41 kg/m?  ? ?SpO2: 96 % (ra) pleasant wm nad walks with cane / somewhat rattling cough  ? ?HEENT : partial bottom, full top/ orophx clear   ? ?NECK :  without JVD/Nodes/TM/ nl carotid upstrokes bilaterally ? ? ?LUNGS: no acc muscle use,  Min barrel  contour chest wall with bilateral  slightly decreased bs s audible wheeze and  without cough on insp or exp maneuvers and min  Hyperresonant  to  percussion bilaterally    ? ? ?CV:  RRR  no s3 or murmur or increase in P2, and no edema  ? ?ABD:  soft and nontender with pos end  insp Hoover's  in the supine position. No bruits or organomegaly appreciated, bowel sounds nl ? ?MS:   N

## 2021-08-30 ENCOUNTER — Encounter: Payer: Self-pay | Admitting: Internal Medicine

## 2021-08-30 DIAGNOSIS — R918 Other nonspecific abnormal finding of lung field: Secondary | ICD-10-CM | POA: Insufficient documentation

## 2021-08-30 NOTE — Assessment & Plan Note (Addendum)
See cxr 08/29/2021  While on azacitidine  ?- 08/29/2021  sats on RA 90% p walking 300 ft in pt previously on 3lpm  ? ?Septic emboli or sepsis with ALI could have caused the changes on cxr but don't fit clinically at this point given that he's better since admit with "sepsis" but his cxr is worse.  Will need HRCT to further characterize but note pulmonary  toxicity from azacitidine is a dx of exclusion with no pathopnemonic features. ? ?Will probably need trial off azacitidine if any progression of lung dz clinically or radiographically but will defer final call to Dr Raliegh Ip and ask pt to monitor sats with ex daily  ? ?Discussed in detail all the  indications, usual  risks and alternatives  relative to the benefits with patient who agrees to proceed with w/u as outlined.    ? ? ? ?

## 2021-09-01 ENCOUNTER — Encounter: Payer: Self-pay | Admitting: General Surgery

## 2021-09-01 ENCOUNTER — Ambulatory Visit (INDEPENDENT_AMBULATORY_CARE_PROVIDER_SITE_OTHER): Payer: Medicare Other | Admitting: General Surgery

## 2021-09-01 ENCOUNTER — Other Ambulatory Visit: Payer: Self-pay

## 2021-09-01 ENCOUNTER — Inpatient Hospital Stay (HOSPITAL_COMMUNITY): Payer: Medicare Other

## 2021-09-01 ENCOUNTER — Ambulatory Visit (HOSPITAL_COMMUNITY): Payer: Medicare Other | Admitting: Dietician

## 2021-09-01 VITALS — BP 100/65 | HR 98 | Temp 97.0°F | Resp 16 | Ht 76.0 in | Wt 118.0 lb

## 2021-09-01 DIAGNOSIS — D469 Myelodysplastic syndrome, unspecified: Secondary | ICD-10-CM | POA: Diagnosis not present

## 2021-09-01 DIAGNOSIS — A419 Sepsis, unspecified organism: Secondary | ICD-10-CM | POA: Diagnosis not present

## 2021-09-01 DIAGNOSIS — T80219A Unspecified infection due to central venous catheter, initial encounter: Secondary | ICD-10-CM

## 2021-09-01 DIAGNOSIS — T80212A Local infection due to central venous catheter, initial encounter: Secondary | ICD-10-CM | POA: Diagnosis not present

## 2021-09-01 DIAGNOSIS — J441 Chronic obstructive pulmonary disease with (acute) exacerbation: Secondary | ICD-10-CM | POA: Diagnosis not present

## 2021-09-01 DIAGNOSIS — D649 Anemia, unspecified: Secondary | ICD-10-CM

## 2021-09-01 DIAGNOSIS — D61818 Other pancytopenia: Secondary | ICD-10-CM | POA: Diagnosis not present

## 2021-09-01 DIAGNOSIS — D696 Thrombocytopenia, unspecified: Secondary | ICD-10-CM | POA: Diagnosis not present

## 2021-09-01 DIAGNOSIS — D500 Iron deficiency anemia secondary to blood loss (chronic): Secondary | ICD-10-CM | POA: Diagnosis not present

## 2021-09-01 DIAGNOSIS — R918 Other nonspecific abnormal finding of lung field: Secondary | ICD-10-CM

## 2021-09-01 LAB — CBC
HCT: 23.4 % — ABNORMAL LOW (ref 39.0–52.0)
Hemoglobin: 7.5 g/dL — ABNORMAL LOW (ref 13.0–17.0)
MCH: 26.9 pg (ref 26.0–34.0)
MCHC: 32.1 g/dL (ref 30.0–36.0)
MCV: 83.9 fL (ref 80.0–100.0)
Platelets: 34 10*3/uL — ABNORMAL LOW (ref 150–400)
RBC: 2.79 MIL/uL — ABNORMAL LOW (ref 4.22–5.81)
RDW: 19.1 % — ABNORMAL HIGH (ref 11.5–15.5)
WBC: 5.4 10*3/uL (ref 4.0–10.5)
nRBC: 3.3 % — ABNORMAL HIGH (ref 0.0–0.2)

## 2021-09-01 LAB — SAMPLE TO BLOOD BANK

## 2021-09-01 NOTE — Progress Notes (Signed)
Hemoglobin is 7.5 today. Patient states he feels good today. Denies SOB, no dizziness. Instructed patient to come to clinic or call if he starts feeling like his blood has dropped. Other wise he will follow up as scheduled.  ?

## 2021-09-01 NOTE — Progress Notes (Signed)
[facility] ? ?Port site removal site healing. Some granulation and minor bleeding.  ? ?BP 100/65   Pulse 98   Temp (!) 97 ?F (36.1 ?C) (Oral)   Resp 16   Ht '6\' 4"'$  (1.93 m)   Wt 118 lb (53.5 kg)   SpO2 98%   BMI 14.36 kg/m?  ? ?Continue dressing changes. ? ?Future Appointments  ?Date Time Provider [place]  ?09/08/2021  8:10 AM AP-ACAPA LAB AP-ACAPA None  ?09/08/2021  8:15 AM AP-ACAPA CHAIR 1 AP-ACAPA None  ?09/08/2021  9:15 AM Derek Jack, MD AP-ACAPA None  ?09/15/2021 10:45 AM Harvel Quale, MD NRE-NRE None  ?09/16/2021  9:15 AM Virl Cagey, MD RS-RS None  ?12/03/2021  1:30 PM Melvyn Novas Christena Deem, MD LBPU-RDS None  ? ? ?[name], MD ?[facility] ?[place], [place] [address] ?[phone] (office) ? ?

## 2021-09-01 NOTE — Progress Notes (Signed)
Nutrition Follow-up: ? ?Patient with myelodysplastic syndrome with pancytopenia. Patient completed 7 cycles of azacitidine on 04/21/21. Patient receiving PRBC transfusions as needed.  ? ?Met with patient in waiting area. He reports feeling good today. Patient reports breathing better "since they plugged up those 2 holes during last hospitalization." His daughter has been visiting recently. She was here for a week during the Easter holiday. Patient reports she will be back in May for another week. Patient reports appetite has improved and feels he is eating better. Yesterday he had a piece of toast and Ensure for breakfast, ate chicken/dumplings, mac/cheese from [employer] for lunch, had BBQ chicken wings and baked potato for supper. Patient continues drinking Ensure milkshake in the evenings. He denies nausea, vomiting, diarrhea, constipation.  ? ? ?Medications: reviewed  ? ?Labs: reviewed  ? ?Anthropometrics: Last weight 118 lb 6.4 oz on 4/14 stable  ? ?4/8 - 118 lb  ?3/30 - 118 lb  ?3/07 - 116 lb 13.5 oz ? ? ?NUTRITION DIAGNOSIS: Severe malnutrition continues  ? ? ? ?INTERVENTION:  ?Continue eating high calorie high protein foods ?Continue drinking 3 Ensure Complete daily, pt politely declined coupons  ?  ? ?MONITORING, EVALUATION, GOAL: weight trends, intake  ? ? ?NEXT VISIT: To be scheduled  ? ? ? ?

## 2021-09-01 NOTE — Patient Instructions (Signed)
Continue dressing changes.  ?

## 2021-09-02 DIAGNOSIS — D469 Myelodysplastic syndrome, unspecified: Secondary | ICD-10-CM | POA: Diagnosis not present

## 2021-09-02 DIAGNOSIS — D500 Iron deficiency anemia secondary to blood loss (chronic): Secondary | ICD-10-CM | POA: Diagnosis not present

## 2021-09-02 DIAGNOSIS — D696 Thrombocytopenia, unspecified: Secondary | ICD-10-CM | POA: Diagnosis not present

## 2021-09-02 DIAGNOSIS — J441 Chronic obstructive pulmonary disease with (acute) exacerbation: Secondary | ICD-10-CM | POA: Diagnosis not present

## 2021-09-02 DIAGNOSIS — A419 Sepsis, unspecified organism: Secondary | ICD-10-CM | POA: Diagnosis not present

## 2021-09-02 DIAGNOSIS — T80212A Local infection due to central venous catheter, initial encounter: Secondary | ICD-10-CM | POA: Diagnosis not present

## 2021-09-02 NOTE — Progress Notes (Signed)
Just making sure you were aware

## 2021-09-05 ENCOUNTER — Emergency Department (HOSPITAL_COMMUNITY)
Admission: EM | Admit: 2021-09-05 | Discharge: 2021-09-05 | Disposition: A | Discharge status: Home / Self Care | Payer: Medicare Other | Attending: Emergency Medicine | Admitting: Emergency Medicine

## 2021-09-05 ENCOUNTER — Encounter (HOSPITAL_COMMUNITY): Payer: Self-pay | Admitting: Emergency Medicine

## 2021-09-05 ENCOUNTER — Other Ambulatory Visit: Payer: Self-pay

## 2021-09-05 DIAGNOSIS — D6481 Anemia due to antineoplastic chemotherapy: Secondary | ICD-10-CM

## 2021-09-05 DIAGNOSIS — T451X5A Adverse effect of antineoplastic and immunosuppressive drugs, initial encounter: Secondary | ICD-10-CM

## 2021-09-05 DIAGNOSIS — D61818 Other pancytopenia: Secondary | ICD-10-CM | POA: Diagnosis not present

## 2021-09-05 DIAGNOSIS — R5383 Other fatigue: Secondary | ICD-10-CM | POA: Diagnosis present

## 2021-09-05 LAB — COMPREHENSIVE METABOLIC PANEL
ALT: 15 U/L (ref 0–44)
AST: 24 U/L (ref 15–41)
Albumin: 3.5 g/dL (ref 3.5–5.0)
Alkaline Phosphatase: 70 U/L (ref 38–126)
Anion gap: 7 (ref 5–15)
BUN: 44 mg/dL — ABNORMAL HIGH (ref 8–23)
CO2: 20 mmol/L — ABNORMAL LOW (ref 22–32)
Calcium: 8.9 mg/dL (ref 8.9–10.3)
Chloride: 111 mmol/L (ref 98–111)
Creatinine, Ser: 2.16 mg/dL — ABNORMAL HIGH (ref 0.61–1.24)
GFR, Estimated: 29 mL/min — ABNORMAL LOW (ref >60)
Glucose, Bld: 106 mg/dL — ABNORMAL HIGH (ref 70–99)
Potassium: 3.7 mmol/L (ref 3.5–5.1)
Sodium: 138 mmol/L (ref 135–145)
Total Bilirubin: 0.8 mg/dL (ref 0.3–1.2)
Total Protein: 7.7 g/dL (ref 6.5–8.1)

## 2021-09-05 LAB — PREPARE RBC (CROSSMATCH)

## 2021-09-05 LAB — CBC WITH DIFFERENTIAL/PLATELET
Band Neutrophils: 1 %
Basophils Absolute: 0 10*3/uL (ref 0.0–0.1)
Basophils Relative: 0 %
Eosinophils Absolute: 0 10*3/uL (ref 0.0–0.5)
Eosinophils Relative: 0 %
HCT: 21.3 % — ABNORMAL LOW (ref 39.0–52.0)
Hemoglobin: 6.7 g/dL — CL (ref 13.0–17.0)
Lymphocytes Relative: 36 %
Lymphs Abs: 1.8 10*3/uL (ref 0.7–4.0)
MCH: 26.1 pg (ref 26.0–34.0)
MCHC: 31.5 g/dL (ref 30.0–36.0)
MCV: 82.9 fL (ref 80.0–100.0)
Metamyelocytes Relative: 2 %
Monocytes Absolute: 0.4 10*3/uL (ref 0.1–1.0)
Monocytes Relative: 9 %
Neutro Abs: 2.6 10*3/uL (ref 1.7–7.7)
Neutrophils Relative %: 52 %
Platelets: 25 10*3/uL — CL (ref 150–400)
RBC: 2.57 MIL/uL — ABNORMAL LOW (ref 4.22–5.81)
RDW: 19.5 % — ABNORMAL HIGH (ref 11.5–15.5)
WBC: 4.9 10*3/uL (ref 4.0–10.5)
nRBC: 3.6 % — ABNORMAL HIGH (ref 0.0–0.2)

## 2021-09-05 MED ORDER — FUROSEMIDE 10 MG/ML IJ SOLN
20.0000 mg | Freq: Once | INTRAMUSCULAR | Status: AC
  Administered 2021-09-05: 20 mg via INTRAVENOUS
  Filled 2021-09-05: qty 2

## 2021-09-05 MED ORDER — SODIUM CHLORIDE 0.9% IV SOLUTION: Freq: Once | INTRAVENOUS | Status: DC

## 2021-09-05 NOTE — Discharge Instructions (Addendum)
Your testing today shows that your blood levels have dropped, you will need to have a recheck on your blood levels in 1 week, please make sure that you follow-up with Dr. Delton Coombes on Monday in the cancer clinic but come back to the emergency department immediately for any severe worsening symptoms ?

## 2021-09-05 NOTE — ED Provider Notes (Signed)
?[name] ?Provider Note ? ? ?CSN: [id number] ?Arrival date & time: 09/05/21  7654 ? ?  ? ?History ? ?Chief Complaint  ?Patient presents with  ? Fatigue  ? ? ?[name] is a 88 y.o. male. ? ?HPI ? ?This patient is an 88 year old male, he has a history of chronic hematologic abnormalities and is followed by Dr. Delton Coombes here at the hospital with the oncology service. ? ?According to the oncology notes, the patient has been diagnosed with a myelodysplastic syndrome, he was followed most recently in the office by Dr. Raliegh Ip on July 28, 2021, is noted to have pancytopenia, he had a jejunal bleeding lesion that was clipped during an EGD recently, he had received 2 units of packed red blood cells on March 7 and 1 unit of platelets on March 10, since that time he was seen again for sepsis and admitted to the hospital after a port was removed and an infection of the port site. ? ?He was then seen again in the emergency department on April 8 during which time he was noted to have a worsening thrombocytopenia, they had dropped down to 24,000 and then were found to be 21,000 on 8 April.  During that visit the patient was giving a unit of platelets in the emergency department. ? ?He states that overnight he felt like he had some blisters pop up on his tongue and he points to 2 hemorrhagic lesions on the left side of the tongue where the tongue appears ecchymotic, there is no active bleeding.  He reports having generalized fatigue chronically, this is something that is a struggle for him but not new.  He has not had any fevers or shortness of breath. ? ?Home Medications ?Prior to Admission medications   ?Medication Sig Start Date End Date Taking? Authorizing Provider  ?albuterol (VENTOLIN HFA) 108 (90 Base) MCG/ACT inhaler Inhale 2 puffs into the lungs every 4 (four) hours as needed for wheezing or shortness of breath. 04/04/20   Roxan Hockey, MD  ?megestrol (MEGACE) 400 MG/10ML suspension Take 10  mLs (400 mg total) by mouth 2 (two) times daily. 07/31/21   Derek Jack, MD  ?midodrine (PROAMATINE) 5 MG tablet Take 1 tablet (5 mg total) by mouth 3 (three) times daily with meals. 08/11/21 09/10/21  Manuella Ghazi, Pratik D, DO  ?oxyCODONE (ROXICODONE) 5 MG immediate release tablet Take 1 tablet (5 mg total) by mouth every 4 (four) hours as needed for severe pain or breakthrough pain. 08/12/21   Virl Cagey, MD  ?pantoprazole (PROTONIX) 40 MG tablet Take 1 tablet (40 mg total) by mouth 2 (two) times daily. 07/16/21 07/16/22  Deatra James, MD  ?predniSONE (DELTASONE) 5 MG tablet Take 5 mg by mouth daily with breakfast.    [provider]  ?silodosin (RAPAFLO) 4 MG CAPS capsule Take 4 mg by mouth at bedtime. 06/27/21   [provider]  ?umeclidinium-vilanterol (ANORO ELLIPTA) 62.5-25 MCG/ACT AEPB Inhale 1 puff into the lungs daily. 08/29/21   Tanda Rockers, MD  ?   ? ?Allergies    ?Patient has no known allergies.   ? ?Review of Systems   ?Review of Systems  ?All other systems reviewed and are negative. ? ?Physical Exam ?Updated Vital Signs ?SpO2 (!) 88%  ?Physical Exam ?Vitals and nursing note reviewed.  ?Constitutional:   ?   General: He is not in acute distress. ?   Appearance: He is well-developed.  ?HENT:  ?   Head: Normocephalic and  atraumatic.  ?   Mouth/Throat:  ?   Pharynx: No oropharyngeal exudate.  ?   Comments: Pale mucous membranes, there are 2 ecchymotic lesions on the left side of the tongue ?Eyes:  ?   General: No scleral icterus.    ?   Right eye: No discharge.     ?   Left eye: No discharge.  ?   Conjunctiva/sclera: Conjunctivae normal.  ?   Pupils: Pupils are equal, round, and reactive to light.  ?Neck:  ?   Thyroid: No thyromegaly.  ?   Vascular: No JVD.  ?Cardiovascular:  ?   Rate and Rhythm: Normal rate and regular rhythm.  ?   Heart sounds: Normal heart sounds. No murmur heard. ?  No friction rub. No gallop.  ?Pulmonary:  ?   Effort: Pulmonary effort is normal. No  respiratory distress.  ?   Breath sounds: Normal breath sounds. No wheezing or rales.  ?Abdominal:  ?   General: Bowel sounds are normal. There is no distension.  ?   Palpations: Abdomen is soft. There is no mass.  ?   Tenderness: There is no abdominal tenderness.  ?Musculoskeletal:     ?   General: No tenderness. Normal range of motion.  ?   Cervical back: Normal range of motion and neck supple.  ?Lymphadenopathy:  ?   Cervical: No cervical adenopathy.  ?Skin: ?   General: Skin is warm and dry.  ?   Coloration: Skin is pale.  ?   Findings: No erythema or rash.  ?Neurological:  ?   Mental Status: He is alert.  ?   Coordination: Coordination normal.  ?Psychiatric:     ?   Behavior: Behavior normal.  ? ? ?ED Results / Procedures / Treatments   ?Labs ?(all labs ordered are listed, but only abnormal results are displayed) ?Labs Reviewed - No data to display ? ?EKG ?None ? ?Radiology ?No results found. ? ?Procedures ?Procedures  ? ? ?Medications Ordered in ED ?Medications - No data to display ? ?ED Course/ Medical Decision Making/ A&P ?  ?                        ?Medical Decision Making ?Amount and/or Complexity of Data Reviewed ?Labs: ordered. ? ?Risk ?Prescription drug management. ? ? ?The patient does not appear to be in distress, he does appear generally weak at his baseline, he is diffusely pale which I suspect is related to chronic anemia and pancytopenia.  We will check platelet levels and discussed with oncology ? ?This patient presents to the ED for concern of symptomatic anemia, this involves an extensive number of treatment options, and is a complaint that carries with it a high risk of complications and morbidity.  The differential diagnosis includes gastrointestinal bleeding though it seems more like myelodysplastic syndrome given his recent labs and the drop today ? ? ?Co morbidities that complicate the patient evaluation ? ?Myelodysplastic syndrome, chronic pancytopenia ? ? ?Additional history  obtained: ? ?Additional history obtained from Dr. Delton Coombes who I spoke with on the phone as well as the medical record ?External records from outside source obtained and reviewed including recent labs showing that the patient is chronically thrombocytopenic but has recently been transfused about 40,000 ? ? ?Lab Tests: ? ?I Ordered, and personally interpreted labs.  The pertinent results include: CBC showing hemoglobin of 6.7 and a platelet count that was low at 25,000 ? ? ? ?Cardiac Monitoring: / EKG: ? ?  The patient was maintained on a cardiac monitor.  I personally viewed and interpreted the cardiac monitored which showed an underlying rhythm of: Sinus rhythm ? ? ?Consultations Obtained: ? ?I requested consultation with the Dr. Delton Coombes,  and discussed lab and imaging findings as well as pertinent plan - they recommend: Transfusion of blood products and red blood cells and platelets ?Dr. Avon Gully who will consider admission though may be able to transfuse and discharge ?Gastroenterology who suggest that the patient can be safely discharged to follow-up in 1 week for repeat labs ? ? ?Problem List / ED Course / Critical interventions / Medication management ? ?Pancytopenia ?I ordered medication including platelet and blood transfusions for pancytopenia ?Reevaluation of the patient after these medicines showed that the patient improved ?I have reviewed the patients home medicines and have made adjustments as needed ? ? ?Social Determinants of Health: ? ?None ? ? ?Test / Admission - Considered: ? ?Considered admission to the hospital, discussed with multiple consultants, patient was ultimately discharged home at their request and ultimately appears hemodynamically stable with that disposition. ?The patient is agreeable to the plan ? ? ? ? ? ? ? ? ?Final Clinical Impression(s) / ED Diagnoses ?Final diagnoses:  ?Pancytopenia ([place])  ? ?  ?Noemi Chapel, MD ?09/06/21 [phone] ? ?

## 2021-09-05 NOTE — ED Notes (Addendum)
Pt BP 96/59 ?

## 2021-09-05 NOTE — ED Triage Notes (Signed)
Pt states he believes his blood platelets are low, pt tongue appears to have two blisters that are purple in color. Hx of bone cancer. Pt appears to be generally pale in color.  ?

## 2021-09-05 NOTE — ED Provider Notes (Signed)
Signout from Dr. Sabra Heck.  88 year old male with history of thrombocytopenia and anemia here with low red blood cell count and platelets. ?Physical Exam  ?BP 114/66   Pulse 68   Temp 98 ?F (36.7 ?C) (Oral)   Resp 18   Ht '6\' 4"'$  (1.93 m)   Wt 55.1 kg   SpO2 100%   BMI 14.78 kg/m?  ? ?Physical Exam ? ?Procedures  ?Procedures ? ?ED Course / MDM  ?  ?Medical Decision Making ?Amount and/or Complexity of Data Reviewed ?Labs: ordered. ? ?Risk ?Prescription drug management. ? ? ?Patient received transfusion of red blood cellsAnd platelets.  He is feeling symptomatically improved.  He is comfortable plan for discharge. ? ? ? ? ?  ?Hayden Rasmussen, MD ?09/06/21 [phone] ? ?

## 2021-09-05 NOTE — ED Notes (Signed)
Pt BP has been low the last few cycles, last BP reading was 96/59, MD made aware  ?

## 2021-09-05 NOTE — Consult Note (Addendum)
?  ? ? ?          Medical Consultation ? ? ?[name]  VQQ:[id number]  DOB: 01/03/1930  DOA: 09/05/2021 ? ?PCP: Sharilyn Sites, MD (Confirm with patient/family/NH records and if not entered, this has to be entered at [facility] point of entry) ? ?Outpatient Specialists: Dr. Delton Coombes oncology ? ? ?Requesting physician: ?Dr. Sabra Heck ED ? ?Reason for consultation: ?Worsening anemia  ? ?History of Present Illness: ?[name] is an 88 y.o. male with history of MDS recent gastric clipping for GI bleed in our facility in the past few weeks.  Presents with worsening fatigue weakness noted to have anemia in the ED, discussed with Dr. Sabra Heck and Dr. Delton Coombes, plan is for 2 unit PRBC and 1 pool platelets for treatment of likely worsening MDS.  Dr. Sabra Heck did discuss case with GI given recent endoscopy there was some concern for possible ongoing bleeding however patient has had normal bowel movements without any further black tarry or red stool and otherwise ongoing GI bleed would be less likely. ? ?Posttransfusion discharge home with close follow-up outpatient with Dr. Delton Coombes is certainly reasonable.  If patient continues to have episodes of anemia worsening fatigue or bleeding we would certainly recommend him to return to the ED for further evaluation and treatment. ? ? ?Review of Systems:  ?As per HPI otherwise 10 point review of systems negative.  ? ? ? ?Past Medical History: ?Past Medical History:  ?Diagnosis Date  ? Bone cancer ([place])   ? Chronic kidney insufficiency   ? COPD (chronic obstructive pulmonary disease) ([place])   ? GERD (gastroesophageal reflux disease)   ? Pulmonary nodule   ? ? ?Past Surgical History: ?Past Surgical History:  ?Procedure Laterality Date  ? BACK SURGERY    ? BIOPSY  07/15/2021  ? Procedure: BIOPSY;  Surgeon: Harvel Quale, MD;  [facility];  Service: Gastroenterology;;  ? CATARACT EXTRACTION W/PHACO Right 03/09/2016  ? Procedure: CATARACT EXTRACTION PHACO AND  INTRAOCULAR LENS PLACEMENT RIGHT EYE CDE=8.58;  Surgeon: Tonny Branch, MD;  Location: [facility] ORS;  Service: Ophthalmology;  Laterality: Right;  right  ? CATARACT EXTRACTION W/PHACO Left 04/13/2016  ? Procedure: CATARACT EXTRACTION PHACO AND INTRAOCULAR LENS PLACEMENT (IOC);  Surgeon: Tonny Branch, MD;  Location: [facility] OR[facility];  Service: Ophthalmology;  Laterality: Left;  CDE: 8.08  ? ENTEROSCOPY  07/23/2021  ? Procedure: ENTEROSCOPY;  Surgeon: Harvel Quale, MD;  [facility];  Service: Gastroenterology;;  ? ESOPHAGOGASTRODUODENOSCOPY (EGD) WITH PROPOFOL N/A 07/14/2021  ? Procedure: ESOPHAGOGASTRODUODENOSCOPY (EGD) WITH PROPOFOL;  Surgeon: Eloise Harman, DO;  [facility];  Service: Endoscopy;  Laterality: N/A;  ? ESOPHAGOGASTRODUODENOSCOPY (EGD) WITH PROPOFOL N/A 07/15/2021  ? Procedure: ESOPHAGOGASTRODUODENOSCOPY (EGD) WITH PROPOFOL;  Surgeon: Harvel Quale, MD;  [facility];  Service: Gastroenterology;  Laterality: N/A;  ? ESOPHAGOGASTRODUODENOSCOPY (EGD) WITH PROPOFOL N/A 07/23/2021  ? Procedure: ESOPHAGOGASTRODUODENOSCOPY (EGD) WITH PROPOFOL;  Surgeon: Harvel Quale, MD;  [facility];  Service: Gastroenterology;  Laterality: N/A;  ? HEMOSTASIS CLIP PLACEMENT  07/15/2021  ? Procedure: HEMOSTASIS CLIP PLACEMENT;  Surgeon: Harvel Quale, MD;  [facility];  Service: Gastroenterology;;  ? HEMOSTASIS CLIP PLACEMENT  07/23/2021  ? Procedure: HEMOSTASIS CLIP PLACEMENT;  Surgeon: Harvel Quale, MD;  [facility];  Service: Gastroenterology;;  ? KYPHOPLASTY Bilateral 06/09/2019  ? Procedure: T11 KYPHOPLASTY;  Surgeon: Consuella Lose, MD;  Location: [place];  Service: Neurosurgery;  Laterality: Bilateral;  ?  None to Date  10/01/15  ? PORT-A-CATH REMOVAL Left 08/07/2021  ? Procedure: MINOR REMOVAL PORT-A-CATH;  Surgeon: Virl Cagey, MD;  Location: [facility];  Service: General;  Laterality: Left;  lidocaine 1%  ?  PORTACATH PLACEMENT Left 10/11/2020  ? Procedure: INSERTION PORT-A-CATH (attached catheter in left internal jugular);  Surgeon: Virl Cagey, MD;  Location: [facility];  Service: General;  Laterality: Left;  ? ? ? ?Allergies: ? No Known Allergies ? ? ?Social History: ? reports that he quit smoking about 30 years ago. His smoking use included cigarettes and cigars. He has never used smokeless tobacco. He reports that he does not currently use alcohol. He reports that he does not use drugs. ? ? ?Family History: ?Family History  ?Problem Relation Age of Onset  ? Kidney disease Mother   ? Kidney disease Sister   ? Colon cancer Neg Hx   ? Gastric cancer Neg Hx   ? Esophageal cancer Neg Hx   ? ? ?Physical Exam: ?Vitals:  ? 09/05/21 1230 09/05/21 1300 09/05/21 1330 09/05/21 1400  ?BP: (!) 91/58 (!) 96/59 (!) 96/59 103/60  ?Pulse: 72 76 70 70  ?Resp: 16 (!) 22 16 (!) 22  ?Temp:      ?TempSrc:      ?SpO2: 100% 100% 100% 100%  ?Weight:      ?Height:      ? ?ABG ?   ?Component Value Date/Time  ? PHART 7.445 06/16/2019 2008  ? PCO2ART 35.1 06/16/2019 2008  ? PO2ART 77.0 (L) 06/16/2019 2008  ? HCO3 22.3 06/26/2020 1504  ? TCO2 14 (L) 08/09/2021 0809  ? ACIDBASEDEF 1.5 06/26/2020 1504  ? O2SAT 33.5 06/26/2020 1504  ? ?Body mass index is 14.78 kg/m?.  ? ?Data reviewed:  I have personally reviewed following labs and imaging studies ?Labs:  ?CBC: ?Recent Labs  ?Lab 09/01/21 ?0848 09/05/21 ?0930  ?WBC 5.4 4.9  ?NEUTROABS  --  2.6  ?HGB 7.5* 6.7*  ?HCT 23.4* 21.3*  ?MCV 83.9 82.9  ?PLT 34* 25*  ? ? ?Basic Metabolic Panel: ?Recent Labs  ?Lab 09/05/21 ?0930  ?NA 138  ?K 3.7  ?CL 111  ?CO2 20*  ?GLUCOSE 106*  ?BUN 44*  ?CREATININE 2.16*  ?CALCIUM 8.9  ? ?GFR ?Estimated Creatinine Clearance: 18.1 mL/min (A) (by C-G formula based on SCr of 2.16 mg/dL (H)). ?Liver Function Tests: ?Recent Labs  ?Lab 09/05/21 ?0930  ?AST 24  ?ALT 15  ?ALKPHOS 70  ?BILITOT 0.8  ?PROT 7.7  ?ALBUMIN 3.5  ? ?No results for input(s): LIPASE, AMYLASE in the last  168 hours. ?No results for input(s): AMMONIA in the last 168 hours. ?Coagulation profile ?No results for input(s): INR, PROTIME in the last 168 hours. ? ?Cardiac Enzymes: ?No results for input(s): CKTOTAL, CKMB, CKMBINDEX, TROPONINI in the last 168 hours. ?BNP: ?Invalid input(s): POCBNP ?CBG: ?No results for input(s): GLUCAP in the last 168 hours. ?D-Dimer ?No results for input(s): DDIMER in the last 72 hours. ?Hgb A1c ?No results for input(s): HGBA1C in the last 72 hours. ?Lipid Profile ?No results for input(s): CHOL, HDL, LDLCALC, TRIG, CHOLHDL, LDLDIRECT in the last 72 hours. ?Thyroid function studies ?No results for input(s): TSH, T4TOTAL, T3FREE, THYROIDAB in the last 72 hours. ? ?Invalid input(s): FREET3 ?Anemia work up ?No results for input(s): VITAMINB12, FOLATE, FERRITIN, TIBC, IRON, RETICCTPCT in the last 72 hours. ?Urinalysis ?   ?Component Value Date/Time  ? COLORURINE STRAW (A) 07/22/2021 0515  ? APPEARANCEUR CLEAR 07/22/2021 0515  ? APPEARANCEUR  [place] (A) 03/26/2021 1334  ? LABSPEC 1.012 07/22/2021 0515  ? PHURINE 5.0 07/22/2021 0515  ? [place] NEGATIVE 07/22/2021 0515  ? HGBUR MODERATE (A) 07/22/2021 0515  ? [place] NEGATIVE 07/22/2021 0515  ? BILIRUBINUR Negative 03/26/2021 1334  ? [place] NEGATIVE 07/22/2021 0515  ? [place] NEGATIVE 07/22/2021 0515  ? UROBILINOGEN 0.2 08/15/2013 1240  ? NITRITE NEGATIVE 07/22/2021 0515  ? LEUKOCYTESUR NEGATIVE 07/22/2021 0515  ? ?Microbiology ?No results found for this or any previous visit (from the past 240 hour(s)). ? ?Inpatient Medications:   ?Scheduled Meds: ? sodium chloride   Intravenous Once  ? sodium chloride   Intravenous Once  ? ? ?Radiological Exams on Admission: ?No results found. ? ?Time Spent: ?80mn ? ?WLittle IshikawaDO ?[facility] ?09/05/2021, 2:35 PM ? ? ? ? ? ? ? ?

## 2021-09-06 DIAGNOSIS — R58 Hemorrhage, not elsewhere classified: Secondary | ICD-10-CM | POA: Diagnosis not present

## 2021-09-07 LAB — PREPARE PLATELET PHERESIS: Unit division: 0

## 2021-09-07 LAB — BPAM RBC
Blood Product Expiration Date: 202305042359
Blood Product Expiration Date: 202305202359
ISSUE DATE / TIME: 202304211639
ISSUE DATE / TIME: 202304211938
Unit Type and Rh: 1700
Unit Type and Rh: 1700

## 2021-09-07 LAB — TYPE AND SCREEN
ABO/RH(D): B NEG
Antibody Screen: NEGATIVE
Unit division: 0
Unit division: 0

## 2021-09-07 LAB — BPAM PLATELET PHERESIS
Blood Product Expiration Date: 202304232359
ISSUE DATE / TIME: 202304211427
Unit Type and Rh: 5100

## 2021-09-08 ENCOUNTER — Inpatient Hospital Stay (HOSPITAL_BASED_OUTPATIENT_CLINIC_OR_DEPARTMENT_OTHER): Payer: Medicare Other | Admitting: Hematology

## 2021-09-08 ENCOUNTER — Inpatient Hospital Stay (HOSPITAL_COMMUNITY): Payer: Medicare Other

## 2021-09-08 ENCOUNTER — Encounter (HOSPITAL_COMMUNITY): Payer: Self-pay | Admitting: Hematology

## 2021-09-08 VITALS — BP 100/62 | HR 81 | Temp 98.1°F | Resp 18

## 2021-09-08 VITALS — BP 91/62 | HR 74 | Temp 97.0°F | Resp 17 | Ht 76.0 in | Wt 117.8 lb

## 2021-09-08 DIAGNOSIS — D61818 Other pancytopenia: Secondary | ICD-10-CM | POA: Diagnosis not present

## 2021-09-08 DIAGNOSIS — D469 Myelodysplastic syndrome, unspecified: Secondary | ICD-10-CM

## 2021-09-08 DIAGNOSIS — D649 Anemia, unspecified: Secondary | ICD-10-CM

## 2021-09-08 LAB — CBC WITH DIFFERENTIAL/PLATELET
Basophils Absolute: 0 10*3/uL (ref 0.0–0.1)
Basophils Relative: 0 %
Blasts: 3 %
Eosinophils Absolute: 0 10*3/uL (ref 0.0–0.5)
Eosinophils Relative: 0 %
HCT: 30.5 % — ABNORMAL LOW (ref 39.0–52.0)
Hemoglobin: 9.8 g/dL — ABNORMAL LOW (ref 13.0–17.0)
Lymphocytes Relative: 43 %
Lymphs Abs: 2.4 10*3/uL (ref 0.7–4.0)
MCH: 26.3 pg (ref 26.0–34.0)
MCHC: 32.1 g/dL (ref 30.0–36.0)
MCV: 82 fL (ref 80.0–100.0)
Monocytes Absolute: 0.1 10*3/uL (ref 0.1–1.0)
Monocytes Relative: 1 %
Neutro Abs: 3 10*3/uL (ref 1.7–7.7)
Neutrophils Relative %: 53 %
Platelets: 20 10*3/uL — CL (ref 150–400)
RBC: 3.72 MIL/uL — ABNORMAL LOW (ref 4.22–5.81)
RDW: 18.3 % — ABNORMAL HIGH (ref 11.5–15.5)
Smear Review: DECREASED
WBC: 5.6 10*3/uL (ref 4.0–10.5)
nRBC: 3.2 % — ABNORMAL HIGH (ref 0.0–0.2)
nRBC: 4 /100 WBC — ABNORMAL HIGH

## 2021-09-08 LAB — COMPREHENSIVE METABOLIC PANEL
ALT: 18 U/L (ref 0–44)
AST: 24 U/L (ref 15–41)
Albumin: 3.5 g/dL (ref 3.5–5.0)
Alkaline Phosphatase: 74 U/L (ref 38–126)
Anion gap: 7 (ref 5–15)
BUN: 31 mg/dL — ABNORMAL HIGH (ref 8–23)
CO2: 21 mmol/L — ABNORMAL LOW (ref 22–32)
Calcium: 9.2 mg/dL (ref 8.9–10.3)
Chloride: 109 mmol/L (ref 98–111)
Creatinine, Ser: 2.06 mg/dL — ABNORMAL HIGH (ref 0.61–1.24)
GFR, Estimated: 30 mL/min — ABNORMAL LOW (ref >60)
Glucose, Bld: 92 mg/dL (ref 70–99)
Potassium: 3.8 mmol/L (ref 3.5–5.1)
Sodium: 137 mmol/L (ref 135–145)
Total Bilirubin: 0.9 mg/dL (ref 0.3–1.2)
Total Protein: 7.7 g/dL (ref 6.5–8.1)

## 2021-09-08 LAB — LACTATE DEHYDROGENASE: LDH: 223 U/L — ABNORMAL HIGH (ref 98–192)

## 2021-09-08 LAB — MAGNESIUM: Magnesium: 2 mg/dL (ref 1.7–2.4)

## 2021-09-08 LAB — SAMPLE TO BLOOD BANK

## 2021-09-08 MED ORDER — ACETAMINOPHEN 325 MG PO TABS
650.0000 mg | ORAL_TABLET | Freq: Once | ORAL | Status: AC
  Administered 2021-09-08: 650 mg via ORAL
  Filled 2021-09-08: qty 2

## 2021-09-08 MED ORDER — SODIUM CHLORIDE 0.9% IV SOLUTION
250.0000 mL | Freq: Once | INTRAVENOUS | Status: AC
  Administered 2021-09-08: 250 mL via INTRAVENOUS

## 2021-09-08 MED ORDER — DIPHENHYDRAMINE HCL 25 MG PO CAPS
25.0000 mg | ORAL_CAPSULE | Freq: Once | ORAL | Status: AC
  Administered 2021-09-08: 25 mg via ORAL
  Filled 2021-09-08: qty 1

## 2021-09-08 NOTE — Patient Instructions (Signed)
[place] at [facility] ?Discharge Instructions ? ?You were seen and examined today by Dr. Delton Coombes. ? ?Dr. Delton Coombes discussed your most recent lab work today. Proceed with 1 unit of platelets today. ? ?You will continue weekly labs checks. ? ?Follow-up as scheduled. ? ? ? ?Thank you for choosing [place] at [facility] to provide your oncology and hematology care.  To afford each patient quality time with our provider, please arrive at least 15 minutes before your scheduled appointment time.  ? ?If you have a lab appointment with the [facility] please come in thru the Main Entrance and check in at the main information desk. ? ?You need to re-schedule your appointment should you arrive 10 or more minutes late.  We strive to give you quality time with our providers, and arriving late affects you and other patients whose appointments are after yours.  Also, if you no show three or more times for appointments you may be dismissed from the clinic at the providers discretion.     ?Again, thank you for choosing [facility].  Our hope is that these requests will decrease the amount of time that you wait before being seen by our physicians.       ?_____________________________________________________________ ? ?Should you have questions after your visit to [facility], please contact our office at [phone] and follow the prompts.  Our office hours are 8:00 a.m. and 4:30 p.m. Monday - Friday.  Please note that voicemails left after 4:00 p.m. may not be returned until the following business day.  We are closed weekends and major holidays.  You do have access to a nurse 24-7, just call the main number to the clinic [phone] and do not press any options, hold on the line and a nurse will answer the phone.   ? ?For prescription refill requests, have your pharmacy contact our office and allow 72 hours.   ? ?Due to Covid, you will need to  wear a mask upon entering the hospital. If you do not have a mask, a mask will be given to you at the Main Entrance upon arrival. For doctor visits, patients may have 1 support person age 23 or older with them. For treatment visits, patients can not have anyone with them due to social distancing guidelines and our immunocompromised population.  ? ? ? ?

## 2021-09-08 NOTE — Progress Notes (Signed)
Message received from Dr. Delton Coombes / Jerilynn Mages. [place] infuse 1 unit of platelets today. Patient has appointment with Dr. Delton Coombes. Lab notified and blood bank notified. Orders placed. ?

## 2021-09-08 NOTE — Patient Instructions (Signed)
[facility]  Discharge Instructions: ?Thank you for choosing [facility] to provide your oncology and hematology care.  ?If you have a lab appointment with the [facility], please come in thru the Main Entrance and check in at the main information desk. ? ?Wear comfortable clothing and clothing appropriate for easy access to any Portacath or PICC line.  ? ?We strive to give you quality time with your provider. You may need to reschedule your appointment if you arrive late (15 or more minutes).  Arriving late affects you and other patients whose appointments are after yours.  Also, if you miss three or more appointments without notifying the office, you may be dismissed from the clinic at the provider?s discretion.    ?  ?For prescription refill requests, have your pharmacy contact our office and allow 72 hours for refills to be completed.   ? ?Today you received 1 unit of blood. ?  ? ? ?BELOW ARE SYMPTOMS THAT SHOULD BE REPORTED IMMEDIATELY: ?*FEVER GREATER THAN 100.4 F (38 ?C) OR HIGHER ?*CHILLS OR SWEATING ?*NAUSEA AND VOMITING THAT IS NOT CONTROLLED WITH YOUR NAUSEA MEDICATION ?*UNUSUAL SHORTNESS OF BREATH ?*UNUSUAL BRUISING OR BLEEDING ?*URINARY PROBLEMS (pain or burning when urinating, or frequent urination) ?*BOWEL PROBLEMS (unusual diarrhea, constipation, pain near the anus) ?TENDERNESS IN MOUTH AND THROAT WITH OR WITHOUT PRESENCE OF ULCERS (sore throat, sores in mouth, or a toothache) ?UNUSUAL RASH, SWELLING OR PAIN  ?UNUSUAL VAGINAL DISCHARGE OR ITCHING  ? ?Items with * indicate a potential emergency and should be followed up as soon as possible or go to the Emergency Department if any problems should occur. ? ?Please show the CHEMOTHERAPY ALERT CARD or IMMUNOTHERAPY ALERT CARD at check-in to the Emergency Department and triage nurse. ? ?Should you have questions after your visit or need to cancel or reschedule your appointment, please contact [facility]  [phone]  and follow the prompts.  Office hours are 8:00 a.m. to 4:30 p.m. Monday - Friday. Please note that voicemails left after 4:00 p.m. may not be returned until the following business day.  We are closed weekends and major holidays. You have access to a nurse at all times for urgent questions. Please call the main number to the clinic [phone] and follow the prompts. ? ?For any non-urgent questions, you may also contact your provider using MyChart. We now offer e-Visits for anyone 42 and older to request care online for non-urgent symptoms. For details visit [email or web address]. ?  ?Also download the MyChart app! Go to the app store, search "MyChart", open the app, select [facility], and log in with your MyChart username and password. ? ?Due to Covid, a mask is required upon entering the hospital/clinic. If you do not have a mask, one will be given to you upon arrival. For doctor visits, patients may have 1 support person aged 56 or older with them. For treatment visits, patients cannot have anyone with them due to current Covid guidelines and our immunocompromised population.  ?

## 2021-09-08 NOTE — Progress Notes (Signed)
CRITICAL VALUE ALERT ?Critical value received:  Platelets 20 ?Date of notification:  09/08/2021 ?Time of notification: 08:54 am  ?Critical value read back:  Yes.   ?Nurse who received alert:  B Zoelle Markus RN ?MD notified time and response:  Katragadda.   ? ?Message received from Dr Delton Coombes / Adonis Huguenin RN, moving forward transfuse patient if platelets are < 30. 1 Unit.  ?

## 2021-09-08 NOTE — Progress Notes (Signed)
Pt presents today for possible blood per provider's order. Vital signs stable and pt voiced no complaints at this time. Pt's hemoglobin is 9.8 and platelets are 20 today. No blood today per Dr.K. Message received from Dr.K to give 1 unit of platelets moving forward, we will transfuse 1 unit of platelets if less than 30 per Dr.K. ? ?Peripheral IV started with good blood return pre and post infusion. ? ?1 unit of platelets given today per MD orders. Tolerated infusion without adverse affects. Vital signs stable. No complaints at this time. Discharged from clinic via wheelchair in stable condition. Alert and oriented x 3. F/U with [facility] as scheduled.   ? ?

## 2021-09-08 NOTE — Progress Notes (Signed)
? [name] ?[address] ?[place], [place] [address] ? ? ?CLINIC:  ?Medical Oncology/Hematology ? ?PCP:  ?Sharilyn Sites, MD ?[address] / [place] [address] ?[phone] ? ? ?REASON FOR VISIT:  ?Follow-up for MDS with pancytopenia ? ?PRIOR THERAPY: Aranesp, Retacrit, intermittent PRBC transfusions ? ?CURRENT THERAPY: Azacitidine (initiated on 09/16/2020) ? ?BRIEF ONCOLOGIC HISTORY:  ?Oncology History  ?Myelodysplastic syndrome ([place])  ?09/16/2020 -  Chemotherapy  ? Patient is on Treatment Plan : MYELODYSPLASIA  Azacitidine IV D1-7 q28d  ? ?  ?  ? ? ?CANCER STAGING: ?Cancer Staging  ?No matching staging information was found for the patient. ? ?INTERVAL HISTORY:  ?[name], a 88 y.o. male, returns for routine follow-up of his MDS with pancytopenia. Ha was last seen on 08/14/2021.  ? ?Today he reports feeling good. He reports a blister on his lip. He denies current bleeding. He reports good eating, and he continues to take Megace. He denies cough. He reports his energy is good.  ? ?REVIEW OF SYSTEMS:  ?Review of Systems  ?Constitutional:  Negative for appetite change and fatigue.  ?HENT:   Negative for nosebleeds.   ?Respiratory:  Negative for cough and hemoptysis.   ?Gastrointestinal:  Negative for blood in stool.  ?Genitourinary:  Negative for hematuria.   ?All other systems reviewed and are negative. ? ?PAST MEDICAL/SURGICAL HISTORY:  ?Past Medical History:  ?Diagnosis Date  ? Bone cancer ([place])   ? Chronic kidney insufficiency   ? COPD (chronic obstructive pulmonary disease) ([place])   ? GERD (gastroesophageal reflux disease)   ? Pulmonary nodule   ? ?Past Surgical History:  ?Procedure Laterality Date  ? BACK SURGERY    ? BIOPSY  07/15/2021  ? Procedure: BIOPSY;  Surgeon: Harvel Quale, MD;  [facility];  Service: Gastroenterology;;  ? CATARACT EXTRACTION W/PHACO Right 03/09/2016  ? Procedure: CATARACT EXTRACTION PHACO AND INTRAOCULAR LENS PLACEMENT RIGHT  EYE CDE=8.58;  Surgeon: Tonny Branch, MD;  Location: [facility];  Service: Ophthalmology;  Laterality: Right;  right  ? CATARACT EXTRACTION W/PHACO Left 04/13/2016  ? Procedure: CATARACT EXTRACTION PHACO AND INTRAOCULAR LENS PLACEMENT (IOC);  Surgeon: Tonny Branch, MD;  Location: [facility];  Service: Ophthalmology;  Laterality: Left;  CDE: 8.08  ? ENTEROSCOPY  07/23/2021  ? Procedure: ENTEROSCOPY;  Surgeon: Harvel Quale, MD;  [facility];  Service: Gastroenterology;;  ? ESOPHAGOGASTRODUODENOSCOPY (EGD) WITH PROPOFOL N/A 07/14/2021  ? Procedure: ESOPHAGOGASTRODUODENOSCOPY (EGD) WITH PROPOFOL;  Surgeon: Eloise Harman, DO;  [facility];  Service: Endoscopy;  Laterality: N/A;  ? ESOPHAGOGASTRODUODENOSCOPY (EGD) WITH PROPOFOL N/A 07/15/2021  ? Procedure: ESOPHAGOGASTRODUODENOSCOPY (EGD) WITH PROPOFOL;  Surgeon: Harvel Quale, MD;  [facility];  Service: Gastroenterology;  Laterality: N/A;  ? ESOPHAGOGASTRODUODENOSCOPY (EGD) WITH PROPOFOL N/A 07/23/2021  ? Procedure: ESOPHAGOGASTRODUODENOSCOPY (EGD) WITH PROPOFOL;  Surgeon: Harvel Quale, MD;  [facility];  Service: Gastroenterology;  Laterality: N/A;  ? HEMOSTASIS CLIP PLACEMENT  07/15/2021  ? Procedure: HEMOSTASIS CLIP PLACEMENT;  Surgeon: Harvel Quale, MD;  [facility];  Service: Gastroenterology;;  ? HEMOSTASIS CLIP PLACEMENT  07/23/2021  ? Procedure: HEMOSTASIS CLIP PLACEMENT;  Surgeon: Harvel Quale, MD;  [facility];  Service: Gastroenterology;;  ? KYPHOPLASTY Bilateral 06/09/2019  ? Procedure: T11 KYPHOPLASTY;  Surgeon: Consuella Lose, MD;  Location: [facility];  Service: Neurosurgery;  Laterality: Bilateral;  ? None to Date  10/01/15  ? PORT-A-CATH REMOVAL Left 08/07/2021  ?  Procedure: MINOR REMOVAL PORT-A-CATH;  Surgeon: Virl Cagey, MD;  Location: [facility];  Service: General;  Laterality: Left;  lidocaine 1%  ? PORTACATH PLACEMENT Left  10/11/2020  ? Procedure: INSERTION PORT-A-CATH (attached catheter in left internal jugular);  Surgeon: Virl Cagey, MD;  Location[facility];  Service: General;  Laterality: Left;  ? ? ?SOCIAL HISTORY:  ?Social History  ? ?Socioeconomic History  ? Marital status: Widowed  ?  Spouse name: Not on file  ? Number of children: Not on file  ? Years of education: Not on file  ? Highest education level: Not on file  ?Occupational History  ? Not on file  ?Tobacco Use  ? Smoking status: Former  ?  Years: 15.00  ?  Types: Cigarettes, Cigars  ?  Quit date: 10/01/1990  ?  Years since quitting: 30.9  ? Smokeless tobacco: Never  ? Tobacco comments:  ?  Quit x 25-30 years; 2 cigars daily when smoked  ?Vaping Use  ? Vaping Use: Never used  ?Substance and Sexual Activity  ? Alcohol use: Not Currently  ?  Alcohol/week: 0.0 standard drinks  ? Drug use: No  ? Sexual activity: Never  ?  Birth control/protection: None  ?Other Topics Concern  ? Not on file  ?Social History Narrative  ? Not on file  ? ?Social Determinants of Health  ? ?Financial Resource Strain: Not on file  ?Food Insecurity: Not on file  ?Transportation Needs: Not on file  ?Physical Activity: Not on file  ?Stress: Not on file  ?Social Connections: Not on file  ?Intimate Partner Violence: Not on file  ? ? ?FAMILY HISTORY:  ?Family History  ?Problem Relation Age of Onset  ? Kidney disease Mother   ? Kidney disease Sister   ? Colon cancer Neg Hx   ? Gastric cancer Neg Hx   ? Esophageal cancer Neg Hx   ? ? ?CURRENT MEDICATIONS:  ?Current Outpatient Medications  ?Medication Sig Dispense Refill  ? albuterol (VENTOLIN HFA) 108 (90 Base) MCG/ACT inhaler Inhale 2 puffs into the lungs every 4 (four) hours as needed for wheezing or shortness of breath. 18 g 2  ? megestrol (MEGACE) 400 MG/10ML suspension Take 10 mLs (400 mg total) by mouth 2 (two) times daily. 480 mL 4  ? midodrine (PROAMATINE) 5 MG tablet Take 1 tablet (5 mg total) by mouth 3 (three) times daily with meals. 90  tablet 1  ? oxyCODONE (ROXICODONE) 5 MG immediate release tablet Take 1 tablet (5 mg total) by mouth every 4 (four) hours as needed for severe pain or breakthrough pain. (Patient not taking: Reported on 09/05/2021) 8 tablet 0  ? pantoprazole (PROTONIX) 40 MG tablet Take 1 tablet (40 mg total) by mouth 2 (two) times daily. (Patient not taking: Reported on 09/05/2021) 30 tablet 1  ? predniSONE (DELTASONE) 5 MG tablet Take 5 mg by mouth daily with breakfast.    ? silodosin (RAPAFLO) 4 MG CAPS capsule Take 4 mg by mouth at bedtime.    ? umeclidinium-vilanterol (ANORO ELLIPTA) 62.5-25 MCG/ACT AEPB Inhale 1 puff into the lungs daily. 1 each 11  ? ?No current facility-administered medications for this visit.  ? ?Facility-Administered Medications Ordered in Other Visits  ?Medication Dose Route Frequency Provider Last Rate Last Admin  ? 0.9 %  sodium chloride infusion   Intravenous Continuous Derek Jack, MD   Stopped at 01/06/21 1018  ? octreotide (SANDOSTATIN LAR) 30 MG IM injection           ?  sodium chloride flush (NS) 0.9 % injection 10 mL  10 mL Intracatheter PRN Derek Jack, MD   10 mL at 01/06/21 1215  ? sodium chloride flush (NS) 0.9 % injection 10 mL  10 mL Intravenous PRN Derek Jack, MD   10 mL at 02/17/21 0902  ? sodium chloride flush (NS) 0.9 % injection 10 mL  10 mL Intravenous PRN Derek Jack, MD   10 mL at 03/10/21 0936  ? ? ?ALLERGIES:  ?No Known Allergies ? ?PHYSICAL EXAM:  ?Performance status (ECOG): 2 - Symptomatic, <50% confined to bed ? ?There were no vitals filed for this visit. ?Wt Readings from Last 3 Encounters:  ?09/05/21 121 lb 6.4 oz (55.1 kg)  ?09/01/21 118 lb (53.5 kg)  ?08/29/21 118 lb 6.4 oz (53.7 kg)  ? ?Physical Exam ?Vitals reviewed.  ?Constitutional:   ?   Appearance: Normal appearance.  ?HENT:  ?   Mouth/Throat:  ?   Lips: Lesions (2 ecchymotic lesions on inside of upper lip) present.  ?Cardiovascular:  ?   Rate and Rhythm: Normal rate and regular  rhythm.  ?   Pulses: Normal pulses.  ?   Heart sounds: Normal heart sounds.  ?Pulmonary:  ?   Effort: Pulmonary effort is normal.  ?   Breath sounds: Normal breath sounds.  ?Neurological:  ?   General: No focal deficit

## 2021-09-09 DIAGNOSIS — J441 Chronic obstructive pulmonary disease with (acute) exacerbation: Secondary | ICD-10-CM | POA: Diagnosis not present

## 2021-09-09 DIAGNOSIS — T80212A Local infection due to central venous catheter, initial encounter: Secondary | ICD-10-CM | POA: Diagnosis not present

## 2021-09-09 DIAGNOSIS — D469 Myelodysplastic syndrome, unspecified: Secondary | ICD-10-CM | POA: Diagnosis not present

## 2021-09-09 DIAGNOSIS — Z4802 Encounter for removal of sutures: Secondary | ICD-10-CM | POA: Diagnosis not present

## 2021-09-09 DIAGNOSIS — D500 Iron deficiency anemia secondary to blood loss (chronic): Secondary | ICD-10-CM | POA: Diagnosis not present

## 2021-09-09 DIAGNOSIS — D696 Thrombocytopenia, unspecified: Secondary | ICD-10-CM | POA: Diagnosis not present

## 2021-09-09 DIAGNOSIS — A419 Sepsis, unspecified organism: Secondary | ICD-10-CM | POA: Diagnosis not present

## 2021-09-15 ENCOUNTER — Encounter (HOSPITAL_COMMUNITY): Payer: Self-pay | Admitting: Hematology

## 2021-09-15 ENCOUNTER — Encounter (INDEPENDENT_AMBULATORY_CARE_PROVIDER_SITE_OTHER): Payer: Self-pay | Admitting: Gastroenterology

## 2021-09-15 ENCOUNTER — Ambulatory Visit (INDEPENDENT_AMBULATORY_CARE_PROVIDER_SITE_OTHER): Payer: Medicare Other | Admitting: Gastroenterology

## 2021-09-15 VITALS — BP 85/48 | HR 95 | Temp 97.5°F | Ht 76.0 in | Wt 119.2 lb

## 2021-09-15 DIAGNOSIS — K921 Melena: Secondary | ICD-10-CM | POA: Diagnosis not present

## 2021-09-15 DIAGNOSIS — K922 Gastrointestinal hemorrhage, unspecified: Secondary | ICD-10-CM | POA: Diagnosis not present

## 2021-09-15 NOTE — Progress Notes (Signed)
Maylon Peppers, M.D. ?Gastroenterology & Hepatology ?[facility] ?[address] ?[place], [place] [address] ? ?Primary Care Physician: ?Sharilyn Sites, MD ?[address] ?[place] [address] ? ?I will communicate my assessment and recommendations to the referring MD via EMR. ? ?Problems: ?Small bowel bleeding, possibly related to small Dieulafoy vs AVM exacerbated GI bleeding from azaticidine ? ?History of Present Illness: ?ULRICH SOULES is  88 year old male with past medical history of MDS, COPD, CKD,  who presents for follow up of small bowel bleeding. ? ?Patient was last seen in the hospital when he presented with an episode of melena.  He had significant drop in his hemoglobin requiring transfusion of PRBC and underwent a total of 3 endoscopic procedures 2 weeks.  His last esophagogastroduodenospy/push enteroscopy was performed on 07/23/2021.  At that time he was found to have mucosal bleeding from the small bowel (vascular lesion could be a small Dieulafoy vs AVM exacerbated GI bleeding from azaticidine).  He had hemostasis after placement of endoscopic clip in the small bowel. ? ?Since he left the hospital, he has felt well and denies any complaints.  Feels fine at he moment and has not presented any recurrent melena. The patient denies having any nausea, vomiting, fever, chills, hematochezia, melena, hematemesis, abdominal distention, abdominal pain, diarrhea, jaundice, pruritus or weight loss. ? ?Patient was found to be anemic on 09/05/2021 and was transfused 1 unit PRBC (ordered by Dr. Delton Coombes).  Repeat hemoglobin on 09/08/2021 was 9.8 platelet count was low - 20,000.  Also, his most recent CMP showed a BUN of 31 creatinine of 2.06 (improved compared to prior). ? ?Last EGD/push enteroscopy: ?07/23/2021 ?Clotted blood was found at the gastroesophageal junction, which was firmly adhered to the esophagus. ?The exam of the esophagus was otherwise  normal. ?Patchy mild inflammation characterized by erythema was found in the gastric body. ?Localized inflammation, characterized by congestion (edema) and erythema was found in the first portion of the duodenum. ?The exam of the duodenum was otherwise normal. ?Localized hemorrhagic mucosa with active bleeding and with stigmata of bleeding was found in the proximal jejunum. Upon lavage it looked as a vascularized area, ?possible Dieulafoy. No clear ulceration or vessel was observed. To stop active bleeding, one hemostatic clip was successfully placed. There was no bleeding at the end of the procedure. ?Note: vascular lesion could be a small Dieulafoy vs AVM exacerbated GI bleeding from azaticidine. ? ?Past Medical History: ?Past Medical History:  ?Diagnosis Date  ? Bone cancer ([place])   ? Chronic kidney insufficiency   ? COPD (chronic obstructive pulmonary disease) ([place])   ? GERD (gastroesophageal reflux disease)   ? Pulmonary nodule   ? ? ?Past Surgical History: ?Past Surgical History:  ?Procedure Laterality Date  ? BACK SURGERY    ? BIOPSY  07/15/2021  ? Procedure: BIOPSY;  Surgeon: Harvel Quale, MD;  [facility];  Service: Gastroenterology;;  ? CATARACT EXTRACTION W/PHACO Right 03/09/2016  ? Procedure: CATARACT EXTRACTION PHACO AND INTRAOCULAR LENS PLACEMENT RIGHT EYE CDE=8.58;  Surgeon: Tonny Branch, MD;  Location: [facility];  Service: Ophthalmology;  Laterality: Right;  right  ? CATARACT EXTRACTION W/PHACO Left 04/13/2016  ? Procedure: CATARACT EXTRACTION PHACO AND INTRAOCULAR LENS PLACEMENT (IOC);  Surgeon: Tonny Branch, MD;  Location: [facility];  Service: Ophthalmology;  Laterality: Left;  CDE: 8.08  ? ENTEROSCOPY  07/23/2021  ? Procedure: ENTEROSCOPY;  Surgeon: Harvel Quale, MD;  [facility];  Service: Gastroenterology;;  ?  ESOPHAGOGASTRODUODENOSCOPY (EGD) WITH PROPOFOL N/A 07/14/2021  ? Procedure: ESOPHAGOGASTRODUODENOSCOPY (EGD) WITH PROPOFOL;  Surgeon: Eloise Harman,  DO;  [facility];  Service: Endoscopy;  Laterality: N/A;  ? ESOPHAGOGASTRODUODENOSCOPY (EGD) WITH PROPOFOL N/A 07/15/2021  ? Procedure: ESOPHAGOGASTRODUODENOSCOPY (EGD) WITH PROPOFOL;  Surgeon: Harvel Quale, MD;  [facility];  Service: Gastroenterology;  Laterality: N/A;  ? ESOPHAGOGASTRODUODENOSCOPY (EGD) WITH PROPOFOL N/A 07/23/2021  ? Procedure: ESOPHAGOGASTRODUODENOSCOPY (EGD) WITH PROPOFOL;  Surgeon: Harvel Quale, MD;  [facility];  Service: Gastroenterology;  Laterality: N/A;  ? HEMOSTASIS CLIP PLACEMENT  07/15/2021  ? Procedure: HEMOSTASIS CLIP PLACEMENT;  Surgeon: Harvel Quale, MD;  [facility];  Service: Gastroenterology;;  ? HEMOSTASIS CLIP PLACEMENT  07/23/2021  ? Procedure: HEMOSTASIS CLIP PLACEMENT;  Surgeon: Harvel Quale, MD;  [facility];  Service: Gastroenterology;;  ? KYPHOPLASTY Bilateral 06/09/2019  ? Procedure: T11 KYPHOPLASTY;  Surgeon: Consuella Lose, MD;  Location: [facility];  Service: Neurosurgery;  Laterality: Bilateral;  ? None to Date  10/01/15  ? PORT-A-CATH REMOVAL Left 08/07/2021  ? Procedure: MINOR REMOVAL PORT-A-CATH;  Surgeon: Virl Cagey, MD;  [facility]: [facility];  Service: General;  Laterality: Left;  lidocaine 1%  ? PORTACATH PLACEMENT Left 10/11/2020  ? Procedure: INSERTION PORT-A-CATH (attached catheter in left internal jugular);  Surgeon: Virl Cagey, MD;  [facility]: [facility];  Service: General;  Laterality: Left;  ? ? ?Family History: ?Family History  ?Problem Relation Age of Onset  ? Kidney disease Mother   ? Kidney disease Sister   ? Colon cancer Neg Hx   ? Gastric cancer Neg Hx   ? Esophageal cancer Neg Hx   ? ? ?Social History: ?Social History  ? ?Tobacco Use  ?Smoking Status Former  ? Years: 15.00  ? Types: Cigarettes, Cigars  ? Quit date: 10/01/1990  ? Years since quitting: 30.9  ?Smokeless Tobacco Never  ?Tobacco Comments  ? Quit x 25-30 years; 2 cigars daily  when smoked  ? ?Social History  ? ?Substance and Sexual Activity  ?Alcohol Use Not Currently  ? Alcohol/week: 0.0 standard drinks  ? ?Social History  ? ?Substance and Sexual Activity  ?Drug Use No  ? ? ?Allergies: ?No Known Allergies ? ?Medications: ?Current Outpatient Medications  ?Medication Sig Dispense Refill  ? albuterol (VENTOLIN HFA) 108 (90 Base) MCG/ACT inhaler Inhale 2 puffs into the lungs every 4 (four) hours as needed for wheezing or shortness of breath. 18 g 2  ? predniSONE (DELTASONE) 5 MG tablet Take 5 mg by mouth daily with breakfast.    ? umeclidinium-vilanterol (ANORO ELLIPTA) 62.5-25 MCG/ACT AEPB Inhale 1 puff into the lungs daily. 1 each 11  ? ?No current facility-administered medications for this visit.  ? ?Facility-Administered Medications Ordered in Other Visits  ?Medication Dose Route Frequency Provider Last Rate Last Admin  ? 0.9 %  sodium chloride infusion   Intravenous Continuous Derek Jack, MD   Stopped at 01/06/21 1018  ? octreotide (SANDOSTATIN LAR) 30 MG IM injection           ? sodium chloride flush (NS) 0.9 % injection 10 mL  10 mL Intracatheter PRN Derek Jack, MD   10 mL at 01/06/21 1215  ? sodium chloride flush (NS) 0.9 % injection 10 mL  10 mL Intravenous PRN Derek Jack, MD   10 mL at 02/17/21 0902  ? sodium chloride flush (NS) 0.9 % injection 10 mL  10 mL Intravenous PRN Derek Jack, MD  10 mL at 03/10/21 0936  ? ? ?Review of Systems: ?GENERAL: negative for malaise, night sweats ?HEENT: No changes in hearing or vision, no nose bleeds or other nasal problems. ?NECK: Negative for lumps, goiter, pain and significant neck swelling ?RESPIRATORY: Negative for cough, wheezing ?CARDIOVASCULAR: Negative for chest pain, leg swelling, palpitations, orthopnea ?GI: SEE HPI ?MUSCULOSKELETAL: Negative for joint pain or swelling, back pain, and muscle pain. ?SKIN: Negative for lesions, rash ?PSYCH: Negative for sleep disturbance, mood disorder and recent  psychosocial stressors. ?HEMATOLOGY Negative for prolonged bleeding, bruising easily, and swollen nodes. ?ENDOCRINE: Negative for cold or heat intolerance, polyuria, polydipsia and goiter. ?NEURO: negative for t

## 2021-09-15 NOTE — Patient Instructions (Signed)
Follow with Dr. Delton Coombes for management of MDS ?Call us back if you develop any recurren episodes of black stool or fresh blood in stool ?

## 2021-09-16 ENCOUNTER — Inpatient Hospital Stay (HOSPITAL_COMMUNITY): Payer: Medicare Other | Attending: Hematology

## 2021-09-16 ENCOUNTER — Ambulatory Visit (INDEPENDENT_AMBULATORY_CARE_PROVIDER_SITE_OTHER): Payer: Medicare Other | Admitting: General Surgery

## 2021-09-16 ENCOUNTER — Encounter: Payer: Self-pay | Admitting: General Surgery

## 2021-09-16 ENCOUNTER — Inpatient Hospital Stay (HOSPITAL_COMMUNITY): Payer: Medicare Other

## 2021-09-16 VITALS — BP 89/50 | HR 79 | Temp 97.7°F | Resp 18 | Ht 76.0 in | Wt 119.0 lb

## 2021-09-16 DIAGNOSIS — Z79899 Other long term (current) drug therapy: Secondary | ICD-10-CM | POA: Insufficient documentation

## 2021-09-16 DIAGNOSIS — T80219A Unspecified infection due to central venous catheter, initial encounter: Secondary | ICD-10-CM

## 2021-09-16 DIAGNOSIS — J441 Chronic obstructive pulmonary disease with (acute) exacerbation: Secondary | ICD-10-CM | POA: Diagnosis not present

## 2021-09-16 DIAGNOSIS — D469 Myelodysplastic syndrome, unspecified: Secondary | ICD-10-CM | POA: Diagnosis not present

## 2021-09-16 DIAGNOSIS — D61818 Other pancytopenia: Secondary | ICD-10-CM | POA: Insufficient documentation

## 2021-09-16 DIAGNOSIS — A419 Sepsis, unspecified organism: Secondary | ICD-10-CM | POA: Diagnosis not present

## 2021-09-16 DIAGNOSIS — Z87891 Personal history of nicotine dependence: Secondary | ICD-10-CM | POA: Insufficient documentation

## 2021-09-16 DIAGNOSIS — T80212A Local infection due to central venous catheter, initial encounter: Secondary | ICD-10-CM | POA: Diagnosis not present

## 2021-09-16 DIAGNOSIS — D500 Iron deficiency anemia secondary to blood loss (chronic): Secondary | ICD-10-CM | POA: Diagnosis not present

## 2021-09-16 DIAGNOSIS — D696 Thrombocytopenia, unspecified: Secondary | ICD-10-CM | POA: Diagnosis not present

## 2021-09-16 DIAGNOSIS — D649 Anemia, unspecified: Secondary | ICD-10-CM

## 2021-09-16 LAB — BPAM PLATELET PHERESIS
Blood Product Expiration Date: 202304272359
ISSUE DATE / TIME: 202304241001
Unit Type and Rh: 9500

## 2021-09-16 LAB — PREPARE PLATELET PHERESIS: Unit division: 0

## 2021-09-16 LAB — CBC
HCT: 25.7 % — ABNORMAL LOW (ref 39.0–52.0)
Hemoglobin: 8.2 g/dL — ABNORMAL LOW (ref 13.0–17.0)
MCH: 25.8 pg — ABNORMAL LOW (ref 26.0–34.0)
MCHC: 31.9 g/dL (ref 30.0–36.0)
MCV: 80.8 fL (ref 80.0–100.0)
Platelets: 34 10*3/uL — ABNORMAL LOW (ref 150–400)
RBC: 3.18 MIL/uL — ABNORMAL LOW (ref 4.22–5.81)
RDW: 19.8 % — ABNORMAL HIGH (ref 11.5–15.5)
WBC: 12.6 10*3/uL — ABNORMAL HIGH (ref 4.0–10.5)
nRBC: 1.5 % — ABNORMAL HIGH (ref 0.0–0.2)

## 2021-09-16 LAB — SAMPLE TO BLOOD BANK

## 2021-09-16 NOTE — Progress Notes (Signed)
[facility] ? ?Area healing, some hypertrophic granulation, silver nitrate applied. ? ?BP (!) 89/50   Pulse 79   Temp 97.7 ?F (36.5 ?C) (Oral)   Resp 18   Ht '6\' 4"'$  (1.93 m)   Wt 119 lb (54 kg)   SpO2 91%   BMI 14.49 kg/m?  ? ?Neosporin to area with bandaid daily. ?There may be some silver gray drainage in the next few days from the silver nitrate. ?Will defer to Dr. Delton Coombes about timing for replacement port.  ? ?CBC ?   ?Component Value Date/Time  ? WBC 12.6 (H) 09/16/2021 1005  ? RBC 3.18 (L) 09/16/2021 1005  ? HGB 8.2 (L) 09/16/2021 1005  ? HCT 25.7 (L) 09/16/2021 1005  ? PLT 34 (L) 09/16/2021 1005  ? MCV 80.8 09/16/2021 1005  ? MCH 25.8 (L) 09/16/2021 1005  ? MCHC 31.9 09/16/2021 1005  ? RDW 19.8 (H) 09/16/2021 1005  ? LYMPHSABS 2.4 09/08/2021 0830  ? MONOABS 0.1 09/08/2021 0830  ? EOSABS 0.0 09/08/2021 0830  ? BASOSABS 0.0 09/08/2021 0830  ? ? ? ? ?Future Appointments  ?Date Time Provider [place]  ?09/16/2021 10:30 AM [facility] LAB [facility] None  ?09/16/2021 11:30 AM [facility] CHAIR 1 [facility] None  ?09/23/2021 10:20 AM [facility] LAB [facility] None  ?09/23/2021 11:30 AM [facility] CHAIR 1 [facility] None  ?09/29/2021 10:30 AM [facility] LAB [facility] None  ?09/29/2021 11:30 AM [facility] CHAIR 1 AP-[facility] None  ?09/30/2021  2:00 PM Virl Cagey, MD RS-RS None  ?10/06/2021 10:40 AM [facility] LAB AP[facility] None  ?10/06/2021 11:30 AM [facility] CHAIR 1 AP-AC[facility]A None  ?10/10/2021  8:00 AM AP-CT 1 AP-CT [place] [facility]  ?10/14/2021 10:20 AM [facility] LAB AP[facility] None  ?10/14/2021 11:30 AM AP[facility] CHAIR 1 AP-[facility] None  ?10/20/2021 10:20 AM [facility] LAB AP[facility] None  ?10/20/2021 11:15 AM Derek Jack, MD AP-ACAPA None  ?10/20/2021 11:30 AM AP-[facility] CHAIR 1 AP-AC[facility]A None  ?12/03/2021  1:30 PM Melvyn Novas Christena Deem, MD L[facility]-RDS None  ? ?[name], MD ?[facility] ?[place], [place] [address] ?[phone] (office) ? ?

## 2021-09-16 NOTE — Progress Notes (Signed)
Labs reviewed today. Patient denies being SOB, no dizziness, patient states he feels good today.  Follow up as scheduled. ? ?

## 2021-09-16 NOTE — Patient Instructions (Signed)
Neosporin to area with bandaid daily. ?There may be some silver gray drainage in the next few days from the silver nitrate. ? ? ?

## 2021-09-20 ENCOUNTER — Emergency Department (HOSPITAL_COMMUNITY): Payer: Medicare Other

## 2021-09-20 ENCOUNTER — Observation Stay (HOSPITAL_COMMUNITY)
Admission: EM | Admit: 2021-09-20 | Discharge: 2021-09-21 | Disposition: A | Discharge status: Home / Self Care | Payer: Medicare Other | Attending: Internal Medicine | Admitting: Internal Medicine

## 2021-09-20 ENCOUNTER — Other Ambulatory Visit: Payer: Self-pay

## 2021-09-20 ENCOUNTER — Encounter (HOSPITAL_COMMUNITY): Payer: Self-pay

## 2021-09-20 DIAGNOSIS — D649 Anemia, unspecified: Secondary | ICD-10-CM | POA: Diagnosis not present

## 2021-09-20 DIAGNOSIS — N1832 Chronic kidney disease, stage 3b: Secondary | ICD-10-CM | POA: Insufficient documentation

## 2021-09-20 DIAGNOSIS — J439 Emphysema, unspecified: Secondary | ICD-10-CM | POA: Diagnosis not present

## 2021-09-20 DIAGNOSIS — D72829 Elevated white blood cell count, unspecified: Secondary | ICD-10-CM

## 2021-09-20 DIAGNOSIS — D696 Thrombocytopenia, unspecified: Secondary | ICD-10-CM

## 2021-09-20 DIAGNOSIS — N179 Acute kidney failure, unspecified: Secondary | ICD-10-CM | POA: Diagnosis not present

## 2021-09-20 DIAGNOSIS — N184 Chronic kidney disease, stage 4 (severe): Secondary | ICD-10-CM

## 2021-09-20 DIAGNOSIS — R918 Other nonspecific abnormal finding of lung field: Secondary | ICD-10-CM

## 2021-09-20 DIAGNOSIS — I959 Hypotension, unspecified: Secondary | ICD-10-CM | POA: Diagnosis not present

## 2021-09-20 DIAGNOSIS — R531 Weakness: Secondary | ICD-10-CM | POA: Diagnosis not present

## 2021-09-20 DIAGNOSIS — Z85830 Personal history of malignant neoplasm of bone: Secondary | ICD-10-CM | POA: Diagnosis not present

## 2021-09-20 DIAGNOSIS — I9589 Other hypotension: Secondary | ICD-10-CM

## 2021-09-20 DIAGNOSIS — Z79899 Other long term (current) drug therapy: Secondary | ICD-10-CM | POA: Diagnosis not present

## 2021-09-20 DIAGNOSIS — Z87891 Personal history of nicotine dependence: Secondary | ICD-10-CM | POA: Insufficient documentation

## 2021-09-20 DIAGNOSIS — D469 Myelodysplastic syndrome, unspecified: Secondary | ICD-10-CM | POA: Diagnosis present

## 2021-09-20 DIAGNOSIS — J449 Chronic obstructive pulmonary disease, unspecified: Secondary | ICD-10-CM | POA: Insufficient documentation

## 2021-09-20 DIAGNOSIS — N183 Chronic kidney disease, stage 3 unspecified: Secondary | ICD-10-CM | POA: Diagnosis present

## 2021-09-20 DIAGNOSIS — E861 Hypovolemia: Secondary | ICD-10-CM

## 2021-09-20 LAB — COMPREHENSIVE METABOLIC PANEL
ALT: 18 U/L (ref 0–44)
AST: 29 U/L (ref 15–41)
Albumin: 3.7 g/dL (ref 3.5–5.0)
Alkaline Phosphatase: 72 U/L (ref 38–126)
Anion gap: 8 (ref 5–15)
BUN: 47 mg/dL — ABNORMAL HIGH (ref 8–23)
CO2: 18 mmol/L — ABNORMAL LOW (ref 22–32)
Calcium: 8.9 mg/dL (ref 8.9–10.3)
Chloride: 109 mmol/L (ref 98–111)
Creatinine, Ser: 2.54 mg/dL — ABNORMAL HIGH (ref 0.61–1.24)
GFR, Estimated: 24 mL/min — ABNORMAL LOW (ref >60)
Glucose, Bld: 102 mg/dL — ABNORMAL HIGH (ref 70–99)
Potassium: 3.6 mmol/L (ref 3.5–5.1)
Sodium: 135 mmol/L (ref 135–145)
Total Bilirubin: 0.9 mg/dL (ref 0.3–1.2)
Total Protein: 8 g/dL (ref 6.5–8.1)

## 2021-09-20 LAB — URINALYSIS, ROUTINE W REFLEX MICROSCOPIC
Bilirubin Urine: NEGATIVE
Glucose, UA: NEGATIVE mg/dL
Ketones, ur: NEGATIVE mg/dL
Leukocytes,Ua: NEGATIVE
Nitrite: NEGATIVE
Protein, ur: 30 mg/dL — AB
Specific Gravity, Urine: 1.02 (ref 1.005–1.030)
pH: 6 (ref 5.0–8.0)

## 2021-09-20 LAB — CBC
HCT: 21.8 % — ABNORMAL LOW (ref 39.0–52.0)
Hemoglobin: 6.9 g/dL — CL (ref 13.0–17.0)
MCH: 25.9 pg — ABNORMAL LOW (ref 26.0–34.0)
MCHC: 31.7 g/dL (ref 30.0–36.0)
MCV: 82 fL (ref 80.0–100.0)
Platelets: 33 10*3/uL — ABNORMAL LOW (ref 150–400)
RBC: 2.66 MIL/uL — ABNORMAL LOW (ref 4.22–5.81)
RDW: 20.3 % — ABNORMAL HIGH (ref 11.5–15.5)
WBC: 21.2 10*3/uL — ABNORMAL HIGH (ref 4.0–10.5)
nRBC: 1.6 % — ABNORMAL HIGH (ref 0.0–0.2)

## 2021-09-20 LAB — URINALYSIS, MICROSCOPIC (REFLEX): Bacteria, UA: NONE SEEN

## 2021-09-20 LAB — PREPARE RBC (CROSSMATCH)

## 2021-09-20 LAB — TROPONIN I (HIGH SENSITIVITY): Troponin I (High Sensitivity): 8 ng/L (ref <18)

## 2021-09-20 MED ORDER — ONDANSETRON HCL 4 MG/2ML IJ SOLN: 4.0000 mg | Freq: Four times a day (QID) | INTRAMUSCULAR | Status: DC | PRN

## 2021-09-20 MED ORDER — ONDANSETRON HCL 4 MG PO TABS: 4.0000 mg | ORAL_TABLET | Freq: Four times a day (QID) | ORAL | Status: DC | PRN

## 2021-09-20 MED ORDER — SODIUM CHLORIDE 0.9 % IV SOLN
10.0000 mL/h | Freq: Once | INTRAVENOUS | Status: AC
  Administered 2021-09-20: 10 mL/h via INTRAVENOUS

## 2021-09-20 MED ORDER — SODIUM CHLORIDE 0.9 % IV BOLUS
1000.0000 mL | Freq: Once | INTRAVENOUS | Status: AC
  Administered 2021-09-20: 1000 mL via INTRAVENOUS

## 2021-09-20 MED ORDER — SODIUM CHLORIDE 0.9% FLUSH
3.0000 mL | Freq: Two times a day (BID) | INTRAVENOUS | Status: DC
  Administered 2021-09-20 – 2021-09-21 (×2): 3 mL via INTRAVENOUS

## 2021-09-20 MED ORDER — ACETAMINOPHEN 650 MG RE SUPP
650.0000 mg | Freq: Four times a day (QID) | RECTAL | Status: DC | PRN
Start: 2021-09-20 — End: 2021-09-21

## 2021-09-20 MED ORDER — SODIUM CHLORIDE 0.9 % IV SOLN: INTRAVENOUS | Status: DC

## 2021-09-20 MED ORDER — SODIUM CHLORIDE 0.9 % IV SOLN: 10.0000 mL/h | Freq: Once | INTRAVENOUS | Status: AC

## 2021-09-20 MED ORDER — PREDNISONE 5 MG PO TABS
5.0000 mg | ORAL_TABLET | Freq: Every day | ORAL | Status: DC
Start: 2021-09-21 — End: 2021-09-21
  Administered 2021-09-21: 5 mg via ORAL
  Filled 2021-09-20: qty 1

## 2021-09-20 MED ORDER — MIDODRINE HCL 5 MG PO TABS
5.0000 mg | ORAL_TABLET | Freq: Three times a day (TID) | ORAL | Status: DC
Start: 2021-09-21 — End: 2021-09-21
  Administered 2021-09-21 (×2): 5 mg via ORAL
  Filled 2021-09-20 (×2): qty 1

## 2021-09-20 MED ORDER — PANTOPRAZOLE 80MG IVPB - SIMPLE MED
80.0000 mg | Freq: Once | INTRAVENOUS | Status: AC
  Administered 2021-09-20: 80 mg via INTRAVENOUS
  Filled 2021-09-20: qty 100

## 2021-09-20 MED ORDER — ACETAMINOPHEN 325 MG PO TABS
650.0000 mg | ORAL_TABLET | Freq: Four times a day (QID) | ORAL | Status: DC | PRN
Start: 2021-09-20 — End: 2021-09-21

## 2021-09-20 NOTE — ED Notes (Signed)
Date and time results received: 09/20/21 11:50 AM ? ? ?Test: Hbg ?Critical Value: 6.9 ? ?Name of Provider Notified: Dr. Ashok Cordia ? ?Orders Received? Or Actions Taken?: see orders ?

## 2021-09-20 NOTE — H&P (Signed)
?History and Physical  ? ? ?[name] JXB:[id number] DOB: Jul 10, 1929 DOA: 09/20/2021 ? ?PCP: Thomas Sites, MD  ?Patient coming from: Home ? ?Chief Complaint: Fatigue ? ?HPI: [name] is a 88 y.o. male with medical history significant of MDS, pancytopenia, small bowel bleeding, possibly related to small [place]ul[place]oy vs AVM, CKD 3b, chronic hypotension on midodrine who presented to the hospital with feelings of fatigue.  He follows with Dr. Delton [name] for MDS, was recently evaluated for GI blood loss.  He receives intermittent blood and platelet transfusions.  He now presents with hypotension, symptomatic anemia with hemoglobin 6.9.  He denies any fevers, chills, nausea, vomiting, abdominal pain or chest pain.  He denies any overt blood loss, no melena or hematochezia. ? ?ED Course: Creatinine elevated at 2.54, WBC 21.2, hemoglobin 6.9, platelet 33.  Patient transfuse 1 unit packed red blood cells. ? ?Review of Systems: As per HPI. Otherwise, all other review of systems reviewed and are negative.  ? ?Past Medical History:  ?Diagnosis Date  ? Bone cancer ([place])   ? Chronic kidney insufficiency   ? COPD (chronic obstructive pulmonary disease) ([place])   ? GERD (gastroesophageal reflux disease)   ? Pulmonary nodule   ? ? ?Past Surgical History:  ?Procedure Laterality Date  ? BACK SURGERY    ? BIOPSY  07/15/2021  ? Procedure: BIOPSY;  Surgeon: Harvel Quale, MD;  [facility];  Service: Gastroenterology;;  ? CATARACT EXTRACTION W/PHACO Right 03/09/2016  ? Procedure: CATARACT EXTRACTION PHACO AND INTRAOCULAR LENS PLACEMENT RIGHT EYE CDE=8.58;  Surgeon: Tonny Branch, MD;  Location: [facility];  Service: Ophthalmology;  Laterality: Right;  right  ? CATARACT EXTRACTION W/PHACO Left 04/13/2016  ? Procedure: CATARACT EXTRACTION PHACO AND INTRAOCULAR LENS PLACEMENT (IOC);  Surgeon: Tonny Branch, MD;  Location: [facility];  Service: Ophthalmology;  Laterality: Left;  CDE: 8.08  ? ENTEROSCOPY  07/23/2021  ?  Procedure: ENTEROSCOPY;  Surgeon: Harvel Quale, MD;  [facility];  Service: Gastroenterology;;  ? ESOPHAGOGASTRODUODENOSCOPY (EGD) WITH PROPOFOL N/A 07/14/2021  ? Procedure: ESOPHAGOGASTRODUODENOSCOPY (EGD) WITH PROPOFOL;  Surgeon: Eloise Harman, DO;  [facility];  Service: Endoscopy;  Laterality: N/A;  ? ESOPHAGOGASTRODUODENOSCOPY (EGD) WITH PROPOFOL N/A 07/15/2021  ? Procedure: ESOPHAGOGASTRODUODENOSCOPY (EGD) WITH PROPOFOL;  Surgeon: Harvel Quale, MD;  [facility];  Service: Gastroenterology;  Laterality: N/A;  ? ESOPHAGOGASTRODUODENOSCOPY (EGD) WITH PROPOFOL N/A 07/23/2021  ? Procedure: ESOPHAGOGASTRODUODENOSCOPY (EGD) WITH PROPOFOL;  Surgeon: Harvel Quale, MD;  [facility];  Service: Gastroenterology;  Laterality: N/A;  ? HEMOSTASIS CLIP PLACEMENT  07/15/2021  ? Procedure: HEMOSTASIS CLIP PLACEMENT;  Surgeon: Harvel Quale, MD;  [facility];  Service: Gastroenterology;;  ? HEMOSTASIS CLIP PLACEMENT  07/23/2021  ? Procedure: HEMOSTASIS CLIP PLACEMENT;  Surgeon: Harvel Quale, MD;  [facility];  Service: Gastroenterology;;  ? KYPHOPLASTY Bilateral 06/09/2019  ? Procedure: T11 KYPHOPLASTY;  Surgeon: Consuella Lose, MD;  Location: [facility];  Service: Neurosurgery;  Laterality: Bilateral;  ? None to Date  10/01/15  ? PORT-A-CATH REMOVAL Left 08/07/2021  ? Procedure: MINOR REMOVAL PORT-A-CATH;  Surgeon: Virl Cagey, MD;  Location: [facility];  Service: General;  Laterality: Left;  lidocaine 1%  ? PORTACATH PLACEMENT Left 10/11/2020  ? Procedure: INSERTION PORT-A-CATH (attached catheter in left internal jugular);  Surgeon: Virl Cagey, MD;  Location: [facility];  Service: General;  Laterality: Left;  ? ? ? reports that he quit smoking about  30 years ago. His smoking use included cigarettes and cigars. He has never used smokeless tobacco. He reports that he does not currently use  alcohol. He reports that he does not use drugs. ? ?No Known Allergies ? ?Family History  ?Problem Relation Age of Onset  ? Kidney disease Mother   ? Kidney disease Sister   ? Colon cancer Neg Hx   ? Gastric cancer Neg Hx   ? Esophageal cancer Neg Hx   ? ? ?Prior to Admission medications   ?Medication Sig Start Date End Date Taking? Authorizing Provider  ?albuterol (VENTOLIN HFA) 108 (90 Base) MCG/ACT inhaler Inhale 2 puffs into the lungs every 4 (four) hours as needed for wheezing or shortness of breath. 04/04/20  Yes Emokpae, Courage, MD  ?predniSONE (DELTASONE) 5 MG tablet Take 5 mg by mouth daily with breakfast.   Yes [provider]  ?methylphenidate (RITALIN) 5 MG tablet TAKE ONE TABLET ONCE DAILY EVERY MORNING ?Patient not taking: Reported on 09/20/2021 06/02/21   [provider]  ?umeclidinium-vilanterol (ANORO ELLIPTA) 62.5-25 MCG/ACT AEPB Inhale 1 puff into the lungs daily. 08/29/21   Tanda Rockers, MD  ? ? ?Physical Exam: ?Vitals:  ? 09/20/21 1248 09/20/21 1306 09/20/21 1400 09/20/21 1430  ?BP: (!) 95/55 (!) 86/52 (!) 108/58 (!) 104/58  ?Pulse: 70 72 70 70  ?Resp: 20 (!) '22 15 13  '$ ?Temp: (!) 97.3 ?F (36.3 ?C) 97.6 ?F (36.4 ?C)    ?TempSrc: Oral Oral    ?SpO2: 100% 100% 100% 100%  ?Weight:      ?Height:      ? ? ? ?Constitutional: NAD, calm, comfortable, thin and chronically ill-appearing ?Eyes: PERRL, lids and conjunctivae normal ?ENMT: Mucous membranes are moist. Normal dentition.  Small ulcer inner right lower lip ?Respiratory: Clear to auscultation bilaterally, no wheezing, no crackles. Normal respiratory effort. No accessory muscle use. No conversational dyspnea  ?Cardiovascular: Regular rate and rhythm, no murmurs. No extremity edema.  ?Abdomen: Soft, nondistended, nontender to palpation. Bowel sounds positive.  ?Musculoskeletal: No joint deformity upper and lower extremities. No contractures. Normal muscle tone.  ?Skin: no rashes, lesions, ulcers on exposed skin  ?Neurologic: Alert  and oriented, speech fluent, CN 2-12 grossly intact. No focal deficits.   ?Psychiatric: Normal judgment and insight. Normal mood and affect  ? ?Labs on Admission: I have personally reviewed following labs and imaging studies ? ?CBC: ?Recent Labs  ?Lab 09/16/21 ?1005 09/20/21 ?1130  ?WBC 12.6* 21.2*  ?HGB 8.2* 6.9*  ?HCT 25.7* 21.8*  ?MCV 80.8 82.0  ?PLT 34* 33*  ? ?Basic Metabolic Panel: ?Recent Labs  ?Lab 09/20/21 ?1130  ?NA 135  ?K 3.6  ?CL 109  ?CO2 18*  ?GLUCOSE 102*  ?BUN 47*  ?CREATININE 2.54*  ?CALCIUM 8.9  ? ?GFR: ?Estimated Creatinine Clearance: 14.8 mL/min (A) (by C-G formula based on SCr of 2.54 mg/dL (H)). ?Liver Function Tests: ?Recent Labs  ?Lab 09/20/21 ?1130  ?AST 29  ?ALT 18  ?ALKPHOS 72  ?BILITOT 0.9  ?PROT 8.0  ?ALBUMIN 3.7  ? ?No results for input(s): LIPASE, AMYLASE in the last 168 hours. ?No results for input(s): AMMONIA in the last 168 hours. ?Coagulation Profile: ?No results for input(s): INR, PROTIME in the last 168 hours. ?Cardiac Enzymes: ?No results for input(s): CKTOTAL, CKMB, CKMBINDEX, TROPONINI in the last 168 hours. ?BNP (last 3 results) ?No results for input(s): PROBNP in the last 8760 hours. ?HbA1C: ?No results for input(s): HGBA1C in the last 72 hours. ?CBG: ?No results for input(s):  GLUCAP in the last 168 hours. ?Lipid Profile: ?No results for input(s): CHOL, HDL, LDLCALC, TRIG, CHOLHDL, LDLDIRECT in the last 72 hours. ?Thyroid Function Tests: ?No results for input(s): TSH, T4TOTAL, FREET4, T3FREE, THYROIDAB in the last 72 hours. ?Anemia Panel: ?No results for input(s): VITAMINB12, FOLATE, FERRITIN, TIBC, IRON, RETICCTPCT in the last 72 hours. ?Urine analysis: ?   ?Component Value Date/Time  ? COLORURINE STRAW (A) 07/22/2021 0515  ? APPEARANCEUR CLEAR 07/22/2021 0515  ? APPEARANCEUR Hazy (A) 03/26/2021 1334  ? LABSPEC 1.012 07/22/2021 0515  ? PHURINE 5.0 07/22/2021 0515  ? [place] NEGATIVE 07/22/2021 0515  ? HGBUR MODERATE (A) 07/22/2021 0515  ? [place] NEGATIVE 07/22/2021  0515  ? BILIRUBINUR Negative 03/26/2021 1334  ? [place] NEGATIVE 07/22/2021 0515  ? [place] NEGATIVE 07/22/2021 0515  ? UROBILINOGEN 0.2 08/15/2013 1240  ? NITRITE NEGATIVE 07/22/2021 0515  ? LEUKOCYTESUR NEGATIVE

## 2021-09-20 NOTE — ED Provider Notes (Addendum)
[facility] EMERGENCY DEPARTMENT Provider Note   CSN: [id number] Arrival date & time: 09/20/21  1103     History  Chief Complaint  Patient presents with   Hypotension    CASY KUSNER is a 88 y.o. male.  Pt with generalized weakness. Hx myelodysplastic syndrome, chronic pancytopenia, frequent transfusion, gi bleeding. Notes increased general weakness and faintness when stands in past couple days. Denies rectal bleeding or melena. No fever or chills. Indicates compliant w home meds. No chest pain or sob. No abd pain. No dysuria. Patients initial bp in 63s - it appears his baseline is low 90s.   The history is provided by the patient and medical records.      Home Medications Prior to Admission medications   Medication Sig Start Date End Date Taking? Authorizing Provider  albuterol (VENTOLIN HFA) 108 (90 Base) MCG/ACT inhaler Inhale 2 puffs into the lungs every 4 (four) hours as needed for wheezing or shortness of breath. 04/04/20   Shon Hale, MD  predniSONE (DELTASONE) 5 MG tablet Take 5 mg by mouth daily with breakfast.    [provider]  umeclidinium-vilanterol (ANORO ELLIPTA) 62.5-25 MCG/ACT AEPB Inhale 1 puff into the lungs daily. 08/29/21   Nyoka Cowden, MD      Allergies    Patient has no known allergies.    Review of Systems   Review of Systems  Constitutional:  Negative for chills and fever.  HENT:  Negative for sore throat.   Eyes:  Negative for redness.  Respiratory:  Negative for cough and shortness of breath.   Cardiovascular:  Negative for chest pain.  Gastrointestinal:  Negative for abdominal pain and blood in stool.  Genitourinary:  Negative for dysuria and flank pain.  Musculoskeletal:  Negative for back pain and neck pain.  Skin:  Negative for rash.  Neurological:  Positive for weakness and light-headedness. Negative for headaches.  Hematological:  Does not bruise/bleed easily.  Psychiatric/Behavioral:  Negative for confusion.     Physical Exam Updated Vital Signs BP (!) 94/58   Temp 97.6 F (36.4 C) (Oral)   Ht 1.93 m (6\' 4" )   Wt 53.1 kg   SpO2 (!) 80%   BMI 14.24 kg/m  Physical Exam Vitals and nursing note reviewed.  Constitutional:      Appearance: He is well-developed.     Comments: Frail, weak, pale appearing.   HENT:     Head: Atraumatic.     Nose: Nose normal.     Mouth/Throat:     Mouth: Mucous membranes are moist.     Pharynx: Oropharynx is clear.  Eyes:     General: No scleral icterus.    Conjunctiva/sclera: Conjunctivae normal.  Neck:     Trachea: No tracheal deviation.     Comments: No stiffness or rigidity.  Cardiovascular:     Rate and Rhythm: Regular rhythm. Tachycardia present.     Pulses: Normal pulses.     Heart sounds: Normal heart sounds. No murmur heard.   No friction rub. No gallop.  Pulmonary:     Effort: Pulmonary effort is normal. No accessory muscle usage or respiratory distress.     Breath sounds: Normal breath sounds.     Comments: Prior port site left chest without infection of abscess.  Abdominal:     General: Bowel sounds are normal. There is no distension.     Palpations: Abdomen is soft.     Tenderness: There is no abdominal tenderness. There is no guarding.  Genitourinary:    Comments: No cva tenderness. Rectal, light brown stool, sent for hemoccult.  Musculoskeletal:        General: No swelling or tenderness.     Cervical back: Normal range of motion and neck supple. No rigidity.  Skin:    General: Skin is warm and dry.     Coloration: Skin is pale.     Findings: No rash.  Neurological:     Mental Status: He is alert.     Comments: Alert, speech clear.   Psychiatric:        Mood and Affect: Mood normal.    ED Results / Procedures / Treatments   Labs (all labs ordered are listed, but only abnormal results are displayed) Results for orders placed or performed during the hospital encounter of 09/20/21  Comprehensive metabolic panel  Result Value  Ref Range   Sodium 135 135 - 145 mmol/L   Potassium 3.6 3.5 - 5.1 mmol/L   Chloride 109 98 - 111 mmol/L   CO2 18 (L) 22 - 32 mmol/L   Glucose, Bld 102 (H) 70 - 99 mg/dL   BUN 47 (H) 8 - 23 mg/dL   Creatinine, Ser 1.61 (H) 0.61 - 1.24 mg/dL   Calcium 8.9 8.9 - 09.6 mg/dL   Total Protein 8.0 6.5 - 8.1 g/dL   Albumin 3.7 3.5 - 5.0 g/dL   AST 29 15 - 41 U/L   ALT 18 0 - 44 U/L   Alkaline Phosphatase 72 38 - 126 U/L   Total Bilirubin 0.9 0.3 - 1.2 mg/dL   GFR, Estimated 24 (L) >60 mL/min   Anion gap 8 5 - 15  CBC  Result Value Ref Range   WBC 21.2 (H) 4.0 - 10.5 K/uL   RBC 2.66 (L) 4.22 - 5.81 MIL/uL   Hemoglobin 6.9 (LL) 13.0 - 17.0 g/dL   HCT 04.5 (L) 40.9 - 81.1 %   MCV 82.0 80.0 - 100.0 fL   MCH 25.9 (L) 26.0 - 34.0 pg   MCHC 31.7 30.0 - 36.0 g/dL   RDW 91.4 (H) 78.2 - 95.6 %   Platelets 33 (L) 150 - 400 K/uL   nRBC 1.6 (H) 0.0 - 0.2 %  Type and screen  Result Value Ref Range   ABO/RH(D) B NEG    Antibody Screen NEG    Sample Expiration      09/23/2021,2359 Performed at [facility], [address] [place], [place] [address]    Unit Number [id number]    Blood Component Type RED CELLS,LR    Unit division 00    Status of Unit ISSUED    Transfusion Status OK TO TRANSFUSE    Crossmatch Result Compatible    Unit Number [id number]    Blood Component Type RED CELLS,LR    Unit division 00    Status of Unit ALLOCATED    Transfusion Status OK TO TRANSFUSE    Crossmatch Result Compatible   Prepare RBC (crossmatch)  Result Value Ref Range   Order Confirmation      ORDER PROCESSED BY BLOOD BANK Performed at [facility], [address] [place], [place] [address]   Prepare RBC (crossmatch)  Result Value Ref Range   Order Confirmation      ORDER PROCESSED BY BLOOD BANK Performed at [facility], [address] [place], [place] [address]   BPAM [facility]  Result Value Ref Range   ISSUE DATE / TIME [id number]    Blood Product Unit Number [id number]  PRODUCT CODE  [id number]    Unit Type and Rh 1700    Blood Product Expiration Date 409811914782    Blood Product Unit Number [id number]    PRODUCT CODE [id number]    Unit Type and Rh 1700    Blood Product Expiration Date [id number]   Troponin I (High Sensitivity)  Result Value Ref Range   Troponin I (High Sensitivity) 8 <18 ng/L   DG Chest 2 View  Result Date: 08/29/2021 CLINICAL DATA:  88 year old male with dyspnea on exertion EXAM: CHEST - 2 VIEW COMPARISON:  08/09/2021 CT 02/15/2021 FINDINGS: Cardiomediastinal silhouette unchanged in size and contour. Pleuroparenchymal thickening at the apices. Stigmata of emphysema, with increased retrosternal airspace, flattened hemidiaphragms, increased AP diameter, and hyperinflation on the AP view. Redemonstration of diffuse architectural distortion and interstitial opacities. Compared to the prior there are new reticulonodular or changes and patchy airspace disease in the lower lungs. No pneumothorax or pleural effusion. Degenerative changes the spine with similar configuration compared to the prior CT IMPRESSION: Advanced emphysema and chronic fibrosis/scarring with new superimposed multifocal infection. Electronically Signed   By: Gilmer Mor D.O.   On: 08/29/2021 16:28     EKG EKG Interpretation  Date/Time:  Saturday Sep 20 2021 11:50:08 EDT Ventricular Rate:  68 PR Interval:  161 QRS Duration: 104 QT Interval:  428 QTC Calculation: 456 R Axis:   77 Text Interpretation: Sinus rhythm Nonspecific T wave abnormality Confirmed by Cathren Laine ([id number]) on 09/20/2021 2:26:06 PM  Radiology DG Chest Port 1 View  Result Date: 09/20/2021 CLINICAL DATA:  Weakness. EXAM: PORTABLE CHEST 1 VIEW COMPARISON:  08/29/2021 and prior studies FINDINGS: Cardiomediastinal silhouette is unchanged. Emphysema and chronic interstitial opacities are again identified. Focal opacity overlying the RIGHT mid lung is not significantly changed and may represent infection/pneumonia but  radiographic follow-up to resolution is recommended. There is no evidence of pneumothorax or large pleural effusion. No acute bony abnormalities are identified. Vertebral augmentation changes are again noted. IMPRESSION: 1. Unchanged RIGHT mid lung opacity which may represent infection/pneumonia. Radiographic follow-up to resolution is recommended. 2. Emphysema and chronic interstitial opacities. Electronically Signed   By: Harmon Pier M.D.   On: 09/20/2021 11:48    Procedures Procedures    Medications Ordered in ED Medications  sodium chloride 0.9 % bolus 1,000 mL (has no administration in time range)  pantoprazole (PROTONIX) 80 mg /NS 100 mL IVPB (has no administration in time range)  0.9 %  sodium chloride infusion (has no administration in time range)    ED Course/ Medical Decision Making/ A&P                           Medical Decision Making Problems Addressed: AKI (acute kidney injury) [facility]): acute illness or injury that poses a threat to life or bodily functions Hypotension due to hypovolemia: acute illness or injury with systemic symptoms that poses a threat to life or bodily functions Leukocytosis, unspecified type: acute illness or injury Myelodysplastic syndrome [facility]): chronic illness or injury with exacerbation, progression, or side effects of treatment that poses a threat to life or bodily functions Stage 4 chronic kidney disease (HCC): chronic illness or injury with exacerbation, progression, or side effects of treatment that poses a threat to life or bodily functions Symptomatic anemia: acute illness or injury with systemic symptoms that poses a threat to life or bodily functions Thrombocytopenia (HCC): chronic illness or injury that poses a threat to life or bodily functions  Amount and/or Complexity of Data Reviewed External Data Reviewed: notes. Labs: ordered. Decision-making details documented in ED Course. Radiology: ordered and independent interpretation performed.  Decision-making details documented in ED Course. ECG/medicine tests: ordered and independent interpretation performed. Decision-making details documented in ED Course. Discussion of management or test interpretation with external provider(s): Hospitalists, discussed pt, labs.   Risk Prescription drug management. Decision regarding hospitalization.   Iv ns. Continuous pulse ox and cardiac monitoring. Labs ordered/sent. Imaging ordered.   Reviewed nursing notes and prior charts for additional history. External reports reviewed.   Protonix iv.   Cardiac monitor: sinus rhythm, rate 74.  Labs reviewed/interpreted by me - hgb 6.9, decreased from prior. Type and screen, plan to initiate transfusion prbcs. Aki on ckd.   Xrays reviewed/interpreted by me - right lung opacity - unchanged from recent. Pt denies cough or fever. No chest pain or increased sob.   Emergent prbc transfusion re hypotension, symptomatic anemia.   Hospitalists consulted for admission. Discussed pt - will admit.   CRITICAL CARE RE: hypotension/symptomatic anemia, requiring emergent transfusion prbc ckd w aki.  Performed by: Suzi Roots Total critical care time: 45 minutes Critical care time was exclusive of separately billable procedures and treating other patients. Critical care was necessary to treat or prevent imminent or life-threatening deterioration. Critical care was time spent personally by me on the following activities: development of treatment plan with patient and/or surrogate as well as nursing, discussions with consultants, evaluation of patient's response to treatment, examination of patient, obtaining history from patient or surrogate, ordering and performing treatments and interventions, ordering and review of laboratory studies, ordering and review of radiographic studies, pulse oximetry and re-evaluation of patient's condition.            Final Clinical Impression(s) / ED Diagnoses Final  diagnoses:  None    Rx / DC Orders ED Discharge Orders     None          Cathren Laine, MD 09/20/21 1437

## 2021-09-20 NOTE — ED Triage Notes (Signed)
Pt came to ED due to feels weak and thinks his bp is low.  RA 70% on arrival, B/p82/50. Pt states he does wear oxygen at times ?

## 2021-09-21 DIAGNOSIS — D649 Anemia, unspecified: Secondary | ICD-10-CM | POA: Diagnosis not present

## 2021-09-21 LAB — CBC
HCT: 22.2 % — ABNORMAL LOW (ref 39.0–52.0)
Hemoglobin: 7.3 g/dL — ABNORMAL LOW (ref 13.0–17.0)
MCH: 27.7 pg (ref 26.0–34.0)
MCHC: 32.9 g/dL (ref 30.0–36.0)
MCV: 84.1 fL (ref 80.0–100.0)
Platelets: 23 10*3/uL — CL (ref 150–400)
RBC: 2.64 MIL/uL — ABNORMAL LOW (ref 4.22–5.81)
RDW: 20.4 % — ABNORMAL HIGH (ref 11.5–15.5)
WBC: 13 10*3/uL — ABNORMAL HIGH (ref 4.0–10.5)
nRBC: 1.9 % — ABNORMAL HIGH (ref 0.0–0.2)

## 2021-09-21 LAB — BASIC METABOLIC PANEL
Anion gap: <3 — ABNORMAL LOW (ref 5–15)
BUN: 40 mg/dL — ABNORMAL HIGH (ref 8–23)
CO2: 20 mmol/L — ABNORMAL LOW (ref 22–32)
Calcium: 7.9 mg/dL — ABNORMAL LOW (ref 8.9–10.3)
Chloride: 116 mmol/L — ABNORMAL HIGH (ref 98–111)
Creatinine, Ser: 2.53 mg/dL — ABNORMAL HIGH (ref 0.61–1.24)
GFR, Estimated: 24 mL/min — ABNORMAL LOW (ref >60)
Glucose, Bld: 105 mg/dL — ABNORMAL HIGH (ref 70–99)
Potassium: 3.6 mmol/L (ref 3.5–5.1)
Sodium: 138 mmol/L (ref 135–145)

## 2021-09-21 LAB — PREPARE RBC (CROSSMATCH)

## 2021-09-21 MED ORDER — MIDODRINE HCL 5 MG PO TABS
5.0000 mg | ORAL_TABLET | Freq: Three times a day (TID) | ORAL | 0 refills | Status: AC
Start: 2021-09-21 — End: ?

## 2021-09-21 MED ORDER — SODIUM CHLORIDE 0.9% IV SOLUTION: Freq: Once | INTRAVENOUS | Status: AC

## 2021-09-21 NOTE — Discharge Summary (Signed)
Physician Discharge Summary  [name] WRU:[id number] DOB: [birth year] DOA: 09/20/2021 ? ?PCP: Thomas Sites, MD ? ?Admit date: 09/20/2021 ?Discharge date: 09/21/2021 ? ?Admitted From: Home ?Disposition:  Home ? ?Recommendations for Outpatient Follow-up:  ?Follow up with PCP in 1 week ?Follow up with [name] ?Repeat CBC in 1 week ? ?Discharge Condition: Stable ?CODE STATUS: DNR ?Diet recommendation: Regular diet ? ?Brief/Interim Summary: ?[name] is a 88 y.o. male with medical history significant of MDS, pancytopenia, small bowel bleeding, possibly related to small Dieulafoy vs AVM, CKD 3b, chronic hypotension on midodrine who presented to the hospital with feelings of fatigue.  He follows with [name] for MDS, was recently evaluated for GI blood loss.  He receives intermittent blood and platelet transfusions.  He now presents with hypotension, symptomatic anemia with hemoglobin 6.9.  He denies any fevers, chills, nausea, vomiting, abdominal pain or chest pain.  He denies any overt blood loss, no melena or hematochezia. ? ?He received total 2 unit packed red blood cells and 1 unit platelets.  He also received IV fluids for AKI.  On day of discharge, patient was feeling back to his baseline, was very adamant about returning back home and did not want to stay for further monitoring or IV fluids.  Encouraged to take p.o.  Unfortunately with his chronic medical conditions, patient is at high risk for repeat presentation to emergency department and hospitalization. ? ?Discharge Diagnoses:  ? ?Principal Problem: ?  Symptomatic anemia ?Active Problems: ?  Myelodysplastic syndrome ([place]) ?  CKD (chronic kidney disease), stage IIIB ?  AKI (acute kidney injury) ([place]) ?  Chronic hypotension ? ? ?Symptomatic anemia ?-History of small bowel bleeding, possibly related to small Dieulafoy vs AVM that was clipped with adequate hemostasis ?-History of MDS ?-Transfused total 2 unit packed red blood cell and 1 unit  platelet during hospital stay ?-Goals Hgb > 7, Platelet > 30  ?  ?AKI on CKD 3b  ?-Baseline Cr 1.7-2 ?-Received IV fluids in the hospital, encouraged p.o. intake ?  ?MDS with pancytopenia ?-Followed by [name]  ?-Gets outpatient blood and platelet transfusions intermittently  ?-Baseline Hgb is around 7-8 ?-Baseline Plt is around 30  ?  ?Chronic hypotension ?-Midodrine  ?-Blood pressure remains stable ?  ?Chronic prednisone use ?-Unclear why he is on chronic prednisone, has been taking it for years ?  ? ?Discharge Instructions ? ?Discharge Instructions   ? ? Call MD for:  difficulty breathing, headache or visual disturbances   Complete by: As directed ?  ? Call MD for:  extreme fatigue   Complete by: As directed ?  ? Call MD for:  persistant dizziness or light-headedness   Complete by: As directed ?  ? Call MD for:  persistant nausea and vomiting   Complete by: As directed ?  ? Call MD for:  severe uncontrolled pain   Complete by: As directed ?  ? Call MD for:  temperature >100.4   Complete by: As directed ?  ? Diet general   Complete by: As directed ?  ? Discharge instructions   Complete by: As directed ?  ? .jcdinc  ? Increase activity slowly   Complete by: As directed ?  ? ?  ? ?Allergies as of 09/21/2021   ?No Known Allergies ?  ? ?  ?Medication List  ?  ? ?TAKE these medications   ? ?albuterol 108 (90 Base) MCG/ACT inhaler ?Commonly known as: VENTOLIN HFA ?Inhale 2 puffs into the lungs  every 4 (four) hours as needed for wheezing or shortness of breath. ?  ?Anoro Ellipta 62.5-25 MCG/ACT Aepb ?Generic drug: umeclidinium-vilanterol ?Inhale 1 puff into the lungs daily. ?  ?methylphenidate 5 MG tablet ?Commonly known as: RITALIN ?TAKE ONE TABLET ONCE DAILY EVERY MORNING ?  ?midodrine 5 MG tablet ?Commonly known as: PROAMATINE ?Take 1 tablet (5 mg total) by mouth 3 (three) times daily with meals. ?  ?predniSONE 5 MG tablet ?Commonly known as: DELTASONE ?Take 5 mg by mouth daily with breakfast. ?  ? ?  ? ?  Follow-up Information   ? ? Thomas Sites, MD Follow up.   ?Specialty: Family Medicine ?Contact information: ?[address] ?[place] [address] ?[phone] ? ? ?  ?  ? ? Thomas Jack, MD Follow up.   ?Specialty: Hematology ?Contact information: ?[address] ?[place] [address] ?[phone] ? ? ?  ?  ? ?  ?  ? ?  ? ?No Known Allergies ? ?Consultations: ?None  ? ? ?Procedures/Studies: ?DG Chest 2 View ? ?Result Date: 08/29/2021 ?CLINICAL DATA:  88 year old male with dyspnea on exertion EXAM: CHEST - 2 VIEW COMPARISON:  08/09/2021 CT 02/15/2021 FINDINGS: Cardiomediastinal silhouette unchanged in size and contour. Pleuroparenchymal thickening at the apices. Stigmata of emphysema, with increased retrosternal airspace, flattened hemidiaphragms, increased AP diameter, and hyperinflation on the AP view. Redemonstration of diffuse architectural distortion and interstitial opacities. Compared to the prior there are new reticulonodular or changes and patchy airspace disease in the lower lungs. No pneumothorax or pleural effusion. Degenerative changes the spine with similar configuration compared to the prior CT IMPRESSION: Advanced emphysema and chronic fibrosis/scarring with new superimposed multifocal infection. Electronically Signed   By: Corrie Mckusick D.O.   On: 08/29/2021 16:28  ? ?DG Chest Port 1 View ? ?Result Date: 09/20/2021 ?CLINICAL DATA:  Weakness. EXAM: PORTABLE CHEST 1 VIEW COMPARISON:  08/29/2021 and prior studies FINDINGS: Cardiomediastinal silhouette is unchanged. Emphysema and chronic interstitial opacities are again identified. Focal opacity overlying the RIGHT mid lung is not significantly changed and may represent infection/pneumonia but radiographic follow-up to resolution is recommended. There is no evidence of pneumothorax or large pleural effusion. No acute bony abnormalities are identified. Vertebral augmentation changes are again noted. IMPRESSION: 1. Unchanged RIGHT mid lung  opacity which may represent infection/pneumonia. Radiographic follow-up to resolution is recommended. 2. Emphysema and chronic interstitial opacities. Electronically Signed   By: Margarette Canada M.D.   On: 09/20/2021 11:48   ?  ? ? ?Discharge Exam: ?Vitals:  ? 09/21/21 0156 09/21/21 0610  ?BP: 98/60 97/62  ?Pulse: 73 71  ?Resp: 18 18  ?Temp: 98.2 ?F (36.8 ?C) 98.1 ?F (36.7 ?C)  ?SpO2: 100% 97%  ? ? ?General: Pt is alert, awake, not in acute distress ?Cardiovascular: RRR, S1/S2 +, no edema ?Respiratory: CTA bilaterally, no wheezing, no rhonchi, no respiratory distress, no conversational dyspnea  ?Abdominal: Soft, NT, ND, bowel sounds + ?Extremities: no edema, no cyanosis ?Psych: Normal mood and affect, stable judgement and insight  ? ? ? ?The results of significant diagnostics from this hospitalization (including imaging, microbiology, ancillary and laboratory) are listed below for reference.   ? ? ?Microbiology: ?No results found for this or any previous visit (from the past 240 hour(s)).  ? ?Labs: ?BNP (last 3 results) ?Recent Labs  ?  02/15/21 ?1946 06/16/21 ?1135  ?BNP 231.0* 52.0  ? ?Basic Metabolic Panel: ?Recent Labs  ?Lab 09/20/21 ?1130 09/21/21 ?4128  ?NA 135 138  ?K 3.6 3.6  ?CL 109  116*  ?CO2 18* 20*  ?GLUCOSE 102* 105*  ?BUN 47* 40*  ?CREATININE 2.54* 2.53*  ?CALCIUM 8.9 7.9*  ? ?Liver Function Tests: ?Recent Labs  ?Lab 09/20/21 ?1130  ?AST 29  ?ALT 18  ?ALKPHOS 72  ?BILITOT 0.9  ?PROT 8.0  ?ALBUMIN 3.7  ? ?No results for input(s): LIPASE, AMYLASE in the last 168 hours. ?No results for input(s): AMMONIA in the last 168 hours. ?CBC: ?Recent Labs  ?Lab 09/16/21 ?1005 09/20/21 ?1130 09/21/21 ?0370  ?WBC 12.6* 21.2* 13.0*  ?HGB 8.2* 6.9* 7.3*  ?HCT 25.7* 21.8* 22.2*  ?MCV 80.8 82.0 84.1  ?PLT 34* 33* 23*  ? ?Cardiac Enzymes: ?No results for input(s): CKTOTAL, CKMB, CKMBINDEX, TROPONINI in the last 168 hours. ?BNP: ?Invalid input(s): POCBNP ?CBG: ?No results for input(s): GLUCAP in the last 168 hours. ?D-Dimer ?No  results for input(s): DDIMER in the last 72 hours. ?Hgb A1c ?No results for input(s): HGBA1C in the last 72 hours. ?Lipid Profile ?No results for input(s): CHOL, HDL, LDLCALC, TRIG, CHOLHDL, LDLDIRECT in the l

## 2021-09-22 LAB — TYPE AND SCREEN
ABO/RH(D): B NEG
Antibody Screen: NEGATIVE
Unit division: 0
Unit division: 0
Unit division: 0

## 2021-09-22 LAB — PREPARE PLATELET PHERESIS: Unit division: 0

## 2021-09-22 LAB — BPAM RBC
Blood Product Expiration Date: 202305222359
Blood Product Expiration Date: 202305252359
Blood Product Expiration Date: 202306072359
ISSUE DATE / TIME: 202305061246
ISSUE DATE / TIME: 202305061544
ISSUE DATE / TIME: 202305071121
Unit Type and Rh: 1700
Unit Type and Rh: 1700
Unit Type and Rh: 1700

## 2021-09-22 LAB — BPAM PLATELET PHERESIS
Blood Product Expiration Date: 202305102359
ISSUE DATE / TIME: 202305071417
Unit Type and Rh: 7300

## 2021-09-23 ENCOUNTER — Inpatient Hospital Stay (HOSPITAL_COMMUNITY): Payer: Medicare Other

## 2021-09-23 ENCOUNTER — Telehealth (HOSPITAL_COMMUNITY): Payer: Self-pay

## 2021-09-23 DIAGNOSIS — N281 Cyst of kidney, acquired: Secondary | ICD-10-CM | POA: Diagnosis not present

## 2021-09-23 DIAGNOSIS — D649 Anemia, unspecified: Secondary | ICD-10-CM

## 2021-09-23 DIAGNOSIS — N1832 Chronic kidney disease, stage 3b: Secondary | ICD-10-CM | POA: Diagnosis not present

## 2021-09-23 DIAGNOSIS — R0789 Other chest pain: Secondary | ICD-10-CM | POA: Diagnosis not present

## 2021-09-23 DIAGNOSIS — M1990 Unspecified osteoarthritis, unspecified site: Secondary | ICD-10-CM | POA: Diagnosis not present

## 2021-09-23 DIAGNOSIS — Z66 Do not resuscitate: Secondary | ICD-10-CM | POA: Diagnosis present

## 2021-09-23 DIAGNOSIS — I9589 Other hypotension: Secondary | ICD-10-CM | POA: Diagnosis not present

## 2021-09-23 DIAGNOSIS — Z681 Body mass index (BMI) 19 or less, adult: Secondary | ICD-10-CM | POA: Diagnosis not present

## 2021-09-23 DIAGNOSIS — T80212A Local infection due to central venous catheter, initial encounter: Secondary | ICD-10-CM | POA: Diagnosis not present

## 2021-09-23 DIAGNOSIS — A419 Sepsis, unspecified organism: Secondary | ICD-10-CM | POA: Diagnosis not present

## 2021-09-23 DIAGNOSIS — E441 Mild protein-calorie malnutrition: Secondary | ICD-10-CM | POA: Diagnosis not present

## 2021-09-23 DIAGNOSIS — J449 Chronic obstructive pulmonary disease, unspecified: Secondary | ICD-10-CM | POA: Diagnosis not present

## 2021-09-23 DIAGNOSIS — Z85830 Personal history of malignant neoplasm of bone: Secondary | ICD-10-CM | POA: Diagnosis not present

## 2021-09-23 DIAGNOSIS — J441 Chronic obstructive pulmonary disease with (acute) exacerbation: Secondary | ICD-10-CM | POA: Diagnosis not present

## 2021-09-23 DIAGNOSIS — I959 Hypotension, unspecified: Secondary | ICD-10-CM | POA: Diagnosis not present

## 2021-09-23 DIAGNOSIS — J9621 Acute and chronic respiratory failure with hypoxia: Secondary | ICD-10-CM | POA: Diagnosis not present

## 2021-09-23 DIAGNOSIS — R0602 Shortness of breath: Secondary | ICD-10-CM | POA: Diagnosis not present

## 2021-09-23 DIAGNOSIS — K5730 Diverticulosis of large intestine without perforation or abscess without bleeding: Secondary | ICD-10-CM | POA: Diagnosis not present

## 2021-09-23 DIAGNOSIS — M479 Spondylosis, unspecified: Secondary | ICD-10-CM | POA: Diagnosis not present

## 2021-09-23 DIAGNOSIS — N401 Enlarged prostate with lower urinary tract symptoms: Secondary | ICD-10-CM | POA: Diagnosis not present

## 2021-09-23 DIAGNOSIS — Z841 Family history of disorders of kidney and ureter: Secondary | ICD-10-CM | POA: Diagnosis not present

## 2021-09-23 DIAGNOSIS — D469 Myelodysplastic syndrome, unspecified: Secondary | ICD-10-CM

## 2021-09-23 DIAGNOSIS — Z7952 Long term (current) use of systemic steroids: Secondary | ICD-10-CM | POA: Diagnosis not present

## 2021-09-23 DIAGNOSIS — W19XXXA Unspecified fall, initial encounter: Secondary | ICD-10-CM | POA: Diagnosis present

## 2021-09-23 DIAGNOSIS — R636 Underweight: Secondary | ICD-10-CM | POA: Diagnosis present

## 2021-09-23 DIAGNOSIS — D696 Thrombocytopenia, unspecified: Secondary | ICD-10-CM | POA: Diagnosis not present

## 2021-09-23 DIAGNOSIS — Y92009 Unspecified place in unspecified non-institutional (private) residence as the place of occurrence of the external cause: Secondary | ICD-10-CM | POA: Diagnosis not present

## 2021-09-23 DIAGNOSIS — I700 Atherosclerosis of aorta: Secondary | ICD-10-CM | POA: Diagnosis not present

## 2021-09-23 DIAGNOSIS — R161 Splenomegaly, not elsewhere classified: Secondary | ICD-10-CM | POA: Diagnosis not present

## 2021-09-23 DIAGNOSIS — D62 Acute posthemorrhagic anemia: Secondary | ICD-10-CM | POA: Diagnosis not present

## 2021-09-23 DIAGNOSIS — Z87891 Personal history of nicotine dependence: Secondary | ICD-10-CM | POA: Diagnosis not present

## 2021-09-23 DIAGNOSIS — D508 Other iron deficiency anemias: Secondary | ICD-10-CM | POA: Diagnosis not present

## 2021-09-23 DIAGNOSIS — D500 Iron deficiency anemia secondary to blood loss (chronic): Secondary | ICD-10-CM | POA: Diagnosis not present

## 2021-09-23 DIAGNOSIS — K219 Gastro-esophageal reflux disease without esophagitis: Secondary | ICD-10-CM | POA: Diagnosis present

## 2021-09-23 DIAGNOSIS — Z7951 Long term (current) use of inhaled steroids: Secondary | ICD-10-CM | POA: Diagnosis not present

## 2021-09-23 DIAGNOSIS — N179 Acute kidney failure, unspecified: Secondary | ICD-10-CM | POA: Diagnosis not present

## 2021-09-23 DIAGNOSIS — Z79899 Other long term (current) drug therapy: Secondary | ICD-10-CM | POA: Diagnosis not present

## 2021-09-23 DIAGNOSIS — R531 Weakness: Secondary | ICD-10-CM | POA: Diagnosis present

## 2021-09-23 LAB — SAMPLE TO BLOOD BANK

## 2021-09-23 LAB — CBC
HCT: 24.7 % — ABNORMAL LOW (ref 39.0–52.0)
Hemoglobin: 8.1 g/dL — ABNORMAL LOW (ref 13.0–17.0)
MCH: 28.4 pg (ref 26.0–34.0)
MCHC: 32.8 g/dL (ref 30.0–36.0)
MCV: 86.7 fL (ref 80.0–100.0)
Platelets: 42 10*3/uL — ABNORMAL LOW (ref 150–400)
RBC: 2.85 MIL/uL — ABNORMAL LOW (ref 4.22–5.81)
RDW: 21.9 % — ABNORMAL HIGH (ref 11.5–15.5)
WBC: 24.3 10*3/uL — ABNORMAL HIGH (ref 4.0–10.5)
nRBC: 1.2 % — ABNORMAL HIGH (ref 0.0–0.2)

## 2021-09-23 NOTE — Telephone Encounter (Signed)
Inez Catalina from [facility] called stating patient was being discharged from home health and was wanting to know if it was possible for palliative care for patient where he goes to hospital frequently for blood. ? ?Returned call to [place] about patient and Inez Catalina stated that she had talked to patient about hospice and he was totally against it. She stated that she would talk to him about Dr. Delton Coombes being over his palliative care since he needs blood transfusions frequently.Dr. Delton Coombes will be informed of this situation along with the proper staff and talk to patient about this concern at next office visit. ?

## 2021-09-23 NOTE — Progress Notes (Signed)
HGB 8.1. No blood products given today. Platelets 42.  ?

## 2021-09-25 ENCOUNTER — Encounter (HOSPITAL_COMMUNITY): Payer: Self-pay

## 2021-09-25 ENCOUNTER — Inpatient Hospital Stay (HOSPITAL_COMMUNITY)
Admission: EM | Admit: 2021-09-25 | Discharge: 2021-09-27 | DRG: 812 | Disposition: A | Discharge status: Home Health | Payer: Medicare Other | Attending: Internal Medicine | Admitting: Internal Medicine

## 2021-09-25 ENCOUNTER — Other Ambulatory Visit: Payer: Self-pay

## 2021-09-25 ENCOUNTER — Emergency Department (HOSPITAL_COMMUNITY): Payer: Medicare Other

## 2021-09-25 ENCOUNTER — Inpatient Hospital Stay (HOSPITAL_COMMUNITY): Payer: Medicare Other

## 2021-09-25 DIAGNOSIS — Z85830 Personal history of malignant neoplasm of bone: Secondary | ICD-10-CM

## 2021-09-25 DIAGNOSIS — R0789 Other chest pain: Secondary | ICD-10-CM | POA: Diagnosis not present

## 2021-09-25 DIAGNOSIS — D696 Thrombocytopenia, unspecified: Secondary | ICD-10-CM | POA: Diagnosis present

## 2021-09-25 DIAGNOSIS — Y92009 Unspecified place in unspecified non-institutional (private) residence as the place of occurrence of the external cause: Secondary | ICD-10-CM | POA: Diagnosis not present

## 2021-09-25 DIAGNOSIS — Z66 Do not resuscitate: Secondary | ICD-10-CM | POA: Diagnosis present

## 2021-09-25 DIAGNOSIS — R531 Weakness: Secondary | ICD-10-CM | POA: Diagnosis present

## 2021-09-25 DIAGNOSIS — N1832 Chronic kidney disease, stage 3b: Secondary | ICD-10-CM | POA: Diagnosis present

## 2021-09-25 DIAGNOSIS — Z87891 Personal history of nicotine dependence: Secondary | ICD-10-CM | POA: Diagnosis not present

## 2021-09-25 DIAGNOSIS — E441 Mild protein-calorie malnutrition: Secondary | ICD-10-CM | POA: Diagnosis not present

## 2021-09-25 DIAGNOSIS — W19XXXA Unspecified fall, initial encounter: Secondary | ICD-10-CM | POA: Diagnosis present

## 2021-09-25 DIAGNOSIS — Z79899 Other long term (current) drug therapy: Secondary | ICD-10-CM | POA: Diagnosis not present

## 2021-09-25 DIAGNOSIS — K219 Gastro-esophageal reflux disease without esophagitis: Secondary | ICD-10-CM | POA: Diagnosis present

## 2021-09-25 DIAGNOSIS — I700 Atherosclerosis of aorta: Secondary | ICD-10-CM | POA: Diagnosis not present

## 2021-09-25 DIAGNOSIS — Z841 Family history of disorders of kidney and ureter: Secondary | ICD-10-CM | POA: Diagnosis not present

## 2021-09-25 DIAGNOSIS — I959 Hypotension, unspecified: Secondary | ICD-10-CM | POA: Diagnosis not present

## 2021-09-25 DIAGNOSIS — R161 Splenomegaly, not elsewhere classified: Secondary | ICD-10-CM | POA: Diagnosis not present

## 2021-09-25 DIAGNOSIS — D649 Anemia, unspecified: Principal | ICD-10-CM

## 2021-09-25 DIAGNOSIS — N179 Acute kidney failure, unspecified: Secondary | ICD-10-CM | POA: Diagnosis present

## 2021-09-25 DIAGNOSIS — N401 Enlarged prostate with lower urinary tract symptoms: Secondary | ICD-10-CM | POA: Diagnosis not present

## 2021-09-25 DIAGNOSIS — J449 Chronic obstructive pulmonary disease, unspecified: Secondary | ICD-10-CM | POA: Diagnosis present

## 2021-09-25 DIAGNOSIS — Z7952 Long term (current) use of systemic steroids: Secondary | ICD-10-CM | POA: Diagnosis not present

## 2021-09-25 DIAGNOSIS — R636 Underweight: Secondary | ICD-10-CM | POA: Diagnosis present

## 2021-09-25 DIAGNOSIS — I9589 Other hypotension: Secondary | ICD-10-CM | POA: Diagnosis present

## 2021-09-25 DIAGNOSIS — J9621 Acute and chronic respiratory failure with hypoxia: Secondary | ICD-10-CM | POA: Diagnosis not present

## 2021-09-25 DIAGNOSIS — M479 Spondylosis, unspecified: Secondary | ICD-10-CM | POA: Diagnosis not present

## 2021-09-25 DIAGNOSIS — D62 Acute posthemorrhagic anemia: Secondary | ICD-10-CM | POA: Diagnosis present

## 2021-09-25 DIAGNOSIS — D500 Iron deficiency anemia secondary to blood loss (chronic): Secondary | ICD-10-CM | POA: Diagnosis not present

## 2021-09-25 DIAGNOSIS — D469 Myelodysplastic syndrome, unspecified: Secondary | ICD-10-CM | POA: Diagnosis present

## 2021-09-25 DIAGNOSIS — Z681 Body mass index (BMI) 19 or less, adult: Secondary | ICD-10-CM | POA: Diagnosis not present

## 2021-09-25 DIAGNOSIS — Z7951 Long term (current) use of inhaled steroids: Secondary | ICD-10-CM | POA: Diagnosis not present

## 2021-09-25 DIAGNOSIS — M1990 Unspecified osteoarthritis, unspecified site: Secondary | ICD-10-CM | POA: Diagnosis not present

## 2021-09-25 DIAGNOSIS — L8990 Pressure ulcer of unspecified site, unspecified stage: Secondary | ICD-10-CM | POA: Insufficient documentation

## 2021-09-25 DIAGNOSIS — D508 Other iron deficiency anemias: Secondary | ICD-10-CM | POA: Diagnosis not present

## 2021-09-25 DIAGNOSIS — N281 Cyst of kidney, acquired: Secondary | ICD-10-CM | POA: Diagnosis not present

## 2021-09-25 DIAGNOSIS — N183 Chronic kidney disease, stage 3 unspecified: Secondary | ICD-10-CM | POA: Diagnosis present

## 2021-09-25 DIAGNOSIS — R0602 Shortness of breath: Secondary | ICD-10-CM | POA: Diagnosis not present

## 2021-09-25 DIAGNOSIS — K5730 Diverticulosis of large intestine without perforation or abscess without bleeding: Secondary | ICD-10-CM | POA: Diagnosis not present

## 2021-09-25 LAB — CBC WITH DIFFERENTIAL/PLATELET
Basophils Absolute: 0 10*3/uL (ref 0.0–0.1)
Basophils Relative: 0 %
Eosinophils Absolute: 0 10*3/uL (ref 0.0–0.5)
Eosinophils Relative: 0 %
HCT: 19.8 % — ABNORMAL LOW (ref 39.0–52.0)
Hemoglobin: 6.2 g/dL — CL (ref 13.0–17.0)
Lymphocytes Relative: 34 %
Lymphs Abs: 8.7 10*3/uL — ABNORMAL HIGH (ref 0.7–4.0)
MCH: 27.6 pg (ref 26.0–34.0)
MCHC: 31.3 g/dL (ref 30.0–36.0)
MCV: 88 fL (ref 80.0–100.0)
Monocytes Absolute: 2.3 10*3/uL — ABNORMAL HIGH (ref 0.1–1.0)
Monocytes Relative: 9 %
Myelocytes: 4 %
Neutro Abs: 13.6 10*3/uL — ABNORMAL HIGH (ref 1.7–7.7)
Neutrophils Relative %: 53 %
Platelets: 41 10*3/uL — ABNORMAL LOW (ref 150–400)
RBC: 2.25 MIL/uL — ABNORMAL LOW (ref 4.22–5.81)
RDW: 23.3 % — ABNORMAL HIGH (ref 11.5–15.5)
WBC: 25.6 10*3/uL — ABNORMAL HIGH (ref 4.0–10.5)
nRBC: 1.2 % — ABNORMAL HIGH (ref 0.0–0.2)

## 2021-09-25 LAB — URINALYSIS, ROUTINE W REFLEX MICROSCOPIC
Bacteria, UA: NONE SEEN
Bilirubin Urine: NEGATIVE
Glucose, UA: NEGATIVE mg/dL
Ketones, ur: NEGATIVE mg/dL
Leukocytes,Ua: NEGATIVE
Nitrite: NEGATIVE
Protein, ur: 30 mg/dL — AB
Specific Gravity, Urine: 1.011 (ref 1.005–1.030)
pH: 5 (ref 5.0–8.0)

## 2021-09-25 LAB — COMPREHENSIVE METABOLIC PANEL
ALT: 15 U/L (ref 0–44)
AST: 26 U/L (ref 15–41)
Albumin: 3.3 g/dL — ABNORMAL LOW (ref 3.5–5.0)
Alkaline Phosphatase: 68 U/L (ref 38–126)
Anion gap: 7 (ref 5–15)
BUN: 38 mg/dL — ABNORMAL HIGH (ref 8–23)
CO2: 20 mmol/L — ABNORMAL LOW (ref 22–32)
Calcium: 8.8 mg/dL — ABNORMAL LOW (ref 8.9–10.3)
Chloride: 110 mmol/L (ref 98–111)
Creatinine, Ser: 2.46 mg/dL — ABNORMAL HIGH (ref 0.61–1.24)
GFR, Estimated: 24 mL/min — ABNORMAL LOW (ref >60)
Glucose, Bld: 96 mg/dL (ref 70–99)
Potassium: 3.6 mmol/L (ref 3.5–5.1)
Sodium: 137 mmol/L (ref 135–145)
Total Bilirubin: 1.3 mg/dL — ABNORMAL HIGH (ref 0.3–1.2)
Total Protein: 6.9 g/dL (ref 6.5–8.1)

## 2021-09-25 LAB — LACTIC ACID, PLASMA
Lactic Acid, Venous: 2 mmol/L (ref 0.5–1.9)
Lactic Acid, Venous: 2 mmol/L (ref 0.5–1.9)

## 2021-09-25 LAB — PREPARE RBC (CROSSMATCH)

## 2021-09-25 MED ORDER — ACETAMINOPHEN 650 MG RE SUPP: 650.0000 mg | Freq: Four times a day (QID) | RECTAL | Status: DC | PRN

## 2021-09-25 MED ORDER — UMECLIDINIUM-VILANTEROL 62.5-25 MCG/ACT IN AEPB
1.0000 | INHALATION_SPRAY | Freq: Every day | RESPIRATORY_TRACT | Status: DC
  Administered 2021-09-26 – 2021-09-27 (×2): 1 via RESPIRATORY_TRACT
  Filled 2021-09-25: qty 14

## 2021-09-25 MED ORDER — CHLORHEXIDINE GLUCONATE CLOTH 2 % EX PADS
6.0000 | MEDICATED_PAD | Freq: Every day | CUTANEOUS | Status: DC
  Administered 2021-09-26 – 2021-09-27 (×2): 6 via TOPICAL

## 2021-09-25 MED ORDER — ACETAMINOPHEN 325 MG PO TABS: 650.0000 mg | ORAL_TABLET | Freq: Four times a day (QID) | ORAL | Status: DC | PRN

## 2021-09-25 MED ORDER — SODIUM CHLORIDE 0.9 % IV SOLN: INTRAVENOUS | Status: DC

## 2021-09-25 MED ORDER — ONDANSETRON HCL 4 MG/2ML IJ SOLN: 4.0000 mg | Freq: Four times a day (QID) | INTRAMUSCULAR | Status: DC | PRN

## 2021-09-25 MED ORDER — ALBUTEROL SULFATE HFA 108 (90 BASE) MCG/ACT IN AERS: 2.0000 | INHALATION_SPRAY | RESPIRATORY_TRACT | Status: DC | PRN

## 2021-09-25 MED ORDER — MIDODRINE HCL 5 MG PO TABS
5.0000 mg | ORAL_TABLET | Freq: Three times a day (TID) | ORAL | Status: DC
Start: 2021-09-25 — End: 2021-09-27
  Administered 2021-09-25 – 2021-09-27 (×6): 5 mg via ORAL
  Filled 2021-09-25 (×6): qty 1

## 2021-09-25 MED ORDER — MIDODRINE HCL 5 MG PO TABS: 5.0000 mg | ORAL_TABLET | Freq: Three times a day (TID) | ORAL | Status: DC

## 2021-09-25 MED ORDER — ONDANSETRON HCL 4 MG PO TABS: 4.0000 mg | ORAL_TABLET | Freq: Four times a day (QID) | ORAL | Status: DC | PRN

## 2021-09-25 MED ORDER — SODIUM CHLORIDE 0.9 % IV BOLUS
500.0000 mL | Freq: Once | INTRAVENOUS | Status: AC
Start: 2021-09-25 — End: 2021-09-25
  Administered 2021-09-25: 500 mL via INTRAVENOUS

## 2021-09-25 MED ORDER — SODIUM CHLORIDE 0.9 % IV SOLN
10.0000 mL/h | Freq: Once | INTRAVENOUS | Status: AC
  Administered 2021-09-25: 10 mL/h via INTRAVENOUS

## 2021-09-25 MED ORDER — PREDNISONE 10 MG PO TABS
5.0000 mg | ORAL_TABLET | Freq: Every day | ORAL | Status: DC
Start: 2021-09-26 — End: 2021-09-27
  Administered 2021-09-26 – 2021-09-27 (×2): 5 mg via ORAL
  Filled 2021-09-25 (×2): qty 1

## 2021-09-25 NOTE — ED Triage Notes (Signed)
Patient arrived via EMS with complaints of weakness. Patient sent from PCP office. Per EMS patient was hypotensive. Patient SOB upon assessment. Patient states he fell Monday, denies LOC and blood thinner.  ?

## 2021-09-25 NOTE — ED Provider Notes (Signed)
?[facility] ?Provider Note ? ? ?CSN: [id number] ?Arrival date & time: 09/25/21  1125 ? ?  ? ?History ? ?Chief Complaint  ?[name] presents with  ? Weakness  ? Hypotension  ? ? ?EDMUNDO TEDESCO is a 88 y.o. male. ? ?[name] with recent admission.  [name] was admitted May 6 through May 7.  [name] received platelets and received blood transfusion.  [name]'s medical history significant for MDS, pancytopenia and small bowel bleeding.  May be secondary to an AVM.  He did have a work-up for this to try to find the GI blood loss.  He receives intermittent blood and platelet transfusions.  He is followed by Dr. Zigmund Daniel from oncology.  [name] after discharge was to follow back up with Dr. Raliegh Ip on Tuesday.  [name] is also known to have low blood pressures. ? ?[name]'s past medical history also known for chronic kidney disease 3B chronic hypotension on midodrine. ? ?[name] had a fall shortly after he got home he fell on his left side.  Mild left-sided chest wall pain and left hip pain.  But able to ambulate and walk.  [name] went to follow-up with his primary care to Dr. Today.  And they noticed his blood pressures were low.  Upon arrival here they were 91/53.  He was sent in for further evaluation he also looked very pale.  [name] is not on blood thinners.  No nausea vomiting or diarrhea.  [name] did not hit his head with the fall. ? ?[name] also states that his daughter was planning to come into town today and was eventually can take him back to her home and the [place] area. ? ? ?  ? ?Home Medications ?Prior to Admission medications   ?Medication Sig Start Date End Date Taking? Authorizing Provider  ?albuterol (VENTOLIN HFA) 108 (90 Base) MCG/ACT inhaler Inhale 2 puffs into the lungs every 4 (four) hours as needed for wheezing or shortness of breath. 04/04/20  Yes Roxan Hockey, MD  ?midodrine (PROAMATINE) 5 MG tablet Take 1 tablet (5 mg total) by mouth 3 (three) times daily with  meals. 09/21/21  Yes Dessa Phi, DO  ?predniSONE (DELTASONE) 5 MG tablet Take 5 mg by mouth daily with breakfast.   Yes [provider]  ?[name] (ANORO ELLIPTA) 62.5-25 MCG/ACT AEPB Inhale 1 puff into the lungs daily. 08/29/21  Yes Tanda Rockers, MD  ?   ? ?Allergies    ?[name] has no known allergies.   ? ?Review of Systems   ?Review of Systems  ?Constitutional:  Positive for fatigue. Negative for chills and fever.  ?HENT:  Negative for ear pain and sore throat.   ?Eyes:  Negative for pain and visual disturbance.  ?Respiratory:  Negative for cough and shortness of breath.   ?Cardiovascular:  Negative for chest pain and palpitations.  ?Gastrointestinal:  Negative for abdominal pain, blood in stool, diarrhea, nausea and vomiting.  ?Genitourinary:  Negative for dysuria and hematuria.  ?Musculoskeletal:  Negative for arthralgias and back pain.  ?Skin:  Negative for color change and rash.  ?Neurological:  Negative for seizures and syncope.  ?All other systems reviewed and are negative. ? ?Physical Exam ?Updated Vital Signs ?BP (!) 98/57   Pulse 80   Temp 97.9 ?F (36.6 ?C) (Oral)   Resp 16   Ht 1.93 m ('6\' 4"'$ )   Wt 55.9 kg   SpO2 96%   BMI 15.01 kg/m?  ?Physical Exam ?Vitals and nursing note reviewed.  ?Constitutional:   ?  General: He is not in acute distress. ?   Appearance: Normal appearance. He is well-developed. He is ill-appearing.  ?   Comments: [name] is very thin and ill-appearing.  ?HENT:  ?   Head: Normocephalic and atraumatic.  ?   Mouth/Throat:  ?   Mouth: Mucous membranes are moist.  ?Eyes:  ?   Extraocular Movements: Extraocular movements intact.  ?   Conjunctiva/sclera: Conjunctivae normal.  ?   Pupils: Pupils are equal, round, and reactive to light.  ?Cardiovascular:  ?   Rate and Rhythm: Normal rate and regular rhythm.  ?   Heart sounds: No murmur heard. ?Pulmonary:  ?   Effort: Pulmonary effort is normal. No respiratory distress.  ?   Breath sounds: Normal breath  sounds.  ?   Comments: Slight tenderness to palpation to the left side of his chest. ?Chest:  ?   Chest wall: Tenderness present.  ?Abdominal:  ?   General: Abdomen is flat.  ?   Palpations: Abdomen is soft.  ?   Tenderness: There is no abdominal tenderness. There is no guarding.  ?Musculoskeletal:     ?   General: No swelling or deformity.  ?   Cervical back: Normal range of motion and neck supple.  ?   Comments: A tick to the anterior aspect of his right forearm.  Without any surrounding erythema.  This was removed.  Good range of motion of all of his extremities lower and upper.  Radial and dorsalis pedis pulse are 1+  ?Skin: ?   General: Skin is warm and dry.  ?   Capillary Refill: Capillary refill takes less than 2 seconds.  ?   Coloration: Skin is pale.  ?Neurological:  ?   General: No focal deficit present.  ?   Mental Status: He is alert and oriented to person, place, and time.  ?   Cranial Nerves: No cranial nerve deficit.  ?   Sensory: No sensory deficit.  ?   Motor: No weakness.  ?Psychiatric:     ?   Mood and Affect: Mood normal.  ? ? ?ED Results / Procedures / Treatments   ?Labs ?(all labs ordered are listed, but only abnormal results are displayed) ?Labs Reviewed  ?CBC WITH DIFFERENTIAL/PLATELET - Abnormal; Notable for the following components:  ?    Result Value  ? WBC 25.6 (*)   ? RBC 2.25 (*)   ? Hemoglobin 6.2 (*)   ? HCT 19.8 (*)   ? RDW 23.3 (*)   ? Platelets 41 (*)   ? nRBC 1.2 (*)   ? Neutro Abs 13.6 (*)   ? Lymphs Abs 8.7 (*)   ? Monocytes Absolute 2.3 (*)   ? All other components within normal limits  ?COMPREHENSIVE METABOLIC PANEL - Abnormal; Notable for the following components:  ? CO2 20 (*)   ? BUN 38 (*)   ? Creatinine, Ser 2.46 (*)   ? Calcium 8.8 (*)   ? Albumin 3.3 (*)   ? Total Bilirubin 1.3 (*)   ? GFR, Estimated 24 (*)   ? All other components within normal limits  ?URINALYSIS, ROUTINE W REFLEX MICROSCOPIC - Abnormal; Notable for the following components:  ? Hgb urine dipstick  SMALL (*)   ? Protein, ur 30 (*)   ? All other components within normal limits  ?LACTIC ACID, PLASMA - Abnormal; Notable for the following components:  ? Lactic Acid, Venous 2.0 (*)   ? All other components within normal limits  ?  LACTIC ACID, PLASMA - Abnormal; Notable for the following components:  ? Lactic Acid, Venous 2.0 (*)   ? All other components within normal limits  ?CULTURE, BLOOD (ROUTINE X 2)  ?CULTURE, BLOOD (ROUTINE X 2)  ?TYPE AND SCREEN  ?PREPARE RBC (CROSSMATCH)  ? ? ?EKG ?EKG Interpretation ? ?Date/Time:  Thursday Sep 25 2021 12:06:44 EDT ?Ventricular Rate:  74 ?PR Interval:  150 ?QRS Duration: 104 ?QT Interval:  397 ?QTC Calculation: 441 ?R Axis:   74 ?Text Interpretation: Sinus rhythm Confirmed by Fredia Sorrow [phone]) on 09/25/2021 12:23:41 PM ? ?Radiology ?DG Chest Port 1 View ? ?Result Date: 09/25/2021 ?CLINICAL DATA:  Shortness of breath EXAM: PORTABLE CHEST 1 VIEW COMPARISON:  Chest x-ray dated Sep 20, 2021 FINDINGS: Cardiac and mediastinal contours are unchanged. Bilateral reticular opacities, most prominent in the lung bases, similar prior exams and likely due to background emphysema and scarring. No new focal consolidation. Previously described right lung opacity is no longer visible. No large pleural effusion or pneumothorax. IMPRESSION: No new focal consolidation. Previously described right lung opacity is no longer visible, likely resolved atelectasis. Electronically Signed   By: Yetta Glassman M.D.   On: 09/25/2021 12:11   ? ?Procedures ?Procedures  ? ? ?Medications Ordered in ED ?Medications  ?0.9 %  sodium chloride infusion ( Intravenous New Bag/Given 09/25/21 1343)  ?0.9 %  sodium chloride infusion (has no administration in time range)  ?midodrine (PROAMATINE) tablet 5 mg (has no administration in time range)  ?sodium chloride 0.9 % bolus 500 mL (0 mLs Intravenous Stopped 09/25/21 1343)  ? ? ?ED Course/ Medical Decision Making/ A&P ?  ?                        ?Medical Decision  Making ?Amount and/or Complexity of Data Reviewed ?Labs: ordered. ?Radiology: ordered. ? ?Risk ?Prescription drug management. ?Decision regarding hospitalization. ? ?CRITICAL CARE ?Performed by: Fredia Sorrow ?[facility]

## 2021-09-25 NOTE — ED Notes (Signed)
EMS gave 100cc of normal saline.  ?

## 2021-09-25 NOTE — H&P (Signed)
?History and Physical  ? ? ?[name] WNU:[id number] DOB: 1929/09/29 DOA: 09/25/2021 ? ?PCP: Sharilyn Sites, MD  ?Patient coming from: Home ? ?I have personally briefly reviewed patient's old medical records in [place] ? ?Chief Complaint: Generalized weakness ? ?HPI: [name] is a 88 y.o. male with medical history significant of myelodysplastic syndrome, chronic kidney disease stage IIIb, COPD, presents to the emergency room from his primary care physician was noted to have low blood pressure and felt generally weak.  Of note, patient does have chronic hypotension and is on midodrine.  He has had difficulties with anemia/thrombocytopenia lately and has required transfusion of PRBCs.  He did have his CBC checked on 5/9 where it was noted that hemoglobin was 8.1.  He said he was feeling generally well at that time, did not have any weakness or lightheadedness.  On that day, he did report that when he was at home, he felt his left knee buckle resulting in a fall to the left side.  He did lay on the ground for extended period of time, but was able to get up and move to his couch.  He did report pain which she took Advil for.  Over the next 2 days, he reports some improvement in his symptoms.  Denies any subsequent dizziness or lightheadedness.  He did not have any shortness of breath.  He went to his primary care physician today to discuss his fall.  When he was evaluated, he was noted to have blood pressure in the 90s and he was feeling lightheaded and dizzy.  He was sent to the ER for evaluation.  He has not noticed any recent melena or hematochezia. ? ?ED Course: On arrival to the emergency room, pressure noted to be in the 90s.  Hemoglobin low at 6.2.  Platelet count above baseline at 41,000.  Creatinine mildly elevated at 2.46, which is consistent from his last hospitalization ? ?Review of Systems: As per HPI otherwise 10 point review of systems negative.  ? ? ?Past Medical History:  ?Diagnosis  Date  ? Bone cancer ([place])   ? Chronic kidney insufficiency   ? COPD (chronic obstructive pulmonary disease) ([place])   ? GERD (gastroesophageal reflux disease)   ? Pulmonary nodule   ? ? ?Past Surgical History:  ?Procedure Laterality Date  ? BACK SURGERY    ? BIOPSY  07/15/2021  ? Procedure: BIOPSY;  Surgeon: Harvel Quale, MD;  [facility];  Service: Gastroenterology;;  ? CATARACT EXTRACTION W/PHACO Right 03/09/2016  ? Procedure: CATARACT EXTRACTION PHACO AND INTRAOCULAR LENS PLACEMENT RIGHT EYE CDE=8.58;  Surgeon: Tonny Branch, MD;  Location: [facility];  Service: Ophthalmology;  Laterality: Right;  right  ? CATARACT EXTRACTION W/PHACO Left 04/13/2016  ? Procedure: CATARACT EXTRACTION PHACO AND INTRAOCULAR LENS PLACEMENT (IOC);  Surgeon: Tonny Branch, MD;  Location: [facility];  Service: Ophthalmology;  Laterality: Left;  CDE: 8.08  ? ENTEROSCOPY  07/23/2021  ? Procedure: ENTEROSCOPY;  Surgeon: Harvel Quale, MD;  [facility];  Service: Gastroenterology;;  ? ESOPHAGOGASTRODUODENOSCOPY (EGD) WITH PROPOFOL N/A 07/14/2021  ? Procedure: ESOPHAGOGASTRODUODENOSCOPY (EGD) WITH PROPOFOL;  Surgeon: Eloise Harman, DO;  [facility];  Service: Endoscopy;  Laterality: N/A;  ? ESOPHAGOGASTRODUODENOSCOPY (EGD) WITH PROPOFOL N/A 07/15/2021  ? Procedure: ESOPHAGOGASTRODUODENOSCOPY (EGD) WITH PROPOFOL;  Surgeon: Harvel Quale, MD;  [facility];  Service: Gastroenterology;  Laterality: N/A;  ? ESOPHAGOGASTRODUODENOSCOPY (EGD) WITH PROPOFOL N/A 07/23/2021  ? Procedure: ESOPHAGOGASTRODUODENOSCOPY (  EGD) WITH PROPOFOL;  Surgeon: Montez Morita, Quillian Quince, MD;  [facility];  Service: Gastroenterology;  Laterality: N/A;  ? HEMOSTASIS CLIP PLACEMENT  07/15/2021  ? Procedure: HEMOSTASIS CLIP PLACEMENT;  Surgeon: Harvel Quale, MD;  [facility];  Service: Gastroenterology;;  ? HEMOSTASIS CLIP PLACEMENT  07/23/2021  ? Procedure: HEMOSTASIS CLIP  PLACEMENT;  Surgeon: Harvel Quale, MD;  [facility];  Service: Gastroenterology;;  ? KYPHOPLASTY Bilateral 06/09/2019  ? Procedure: T11 KYPHOPLASTY;  Surgeon: Consuella Lose, MD;  Location: [place];  Service: Neurosurgery;  Laterality: Bilateral;  ? None to Date  10/01/15  ? PORT-A-CATH REMOVAL Left 08/07/2021  ? Procedure: MINOR REMOVAL PORT-A-CATH;  Surgeon: Virl Cagey, MD;  Location: [facility];  Service: General;  Laterality: Left;  lidocaine 1%  ? PORTACATH PLACEMENT Left 10/11/2020  ? Procedure: INSERTION PORT-A-CATH (attached catheter in left internal jugular);  Surgeon: Virl Cagey, MD;  Location: [facility];  Service: General;  Laterality: Left;  ? ? ?Social History: ? reports that he quit smoking about 31 years ago. His smoking use included cigarettes and cigars. He has never used smokeless tobacco. He reports that he does not currently use alcohol. He reports that he does not use drugs. ? ?No Known Allergies ? ?Family History  ?Problem Relation Age of Onset  ? Kidney disease Mother   ? Kidney disease Sister   ? Colon cancer Neg Hx   ? Gastric cancer Neg Hx   ? Esophageal cancer Neg Hx   ? ? ?Prior to Admission medications   ?Medication Sig Start Date End Date Taking? Authorizing Provider  ?albuterol (VENTOLIN HFA) 108 (90 Base) MCG/ACT inhaler Inhale 2 puffs into the lungs every 4 (four) hours as needed for wheezing or shortness of breath. 04/04/20  Yes Roxan Hockey, MD  ?midodrine (PROAMATINE) 5 MG tablet Take 1 tablet (5 mg total) by mouth 3 (three) times daily with meals. 09/21/21  Yes Dessa Phi, DO  ?predniSONE (DELTASONE) 5 MG tablet Take 5 mg by mouth daily with breakfast.   Yes [provider]  ?umeclidinium-vilanterol (ANORO ELLIPTA) 62.5-25 MCG/ACT AEPB Inhale 1 puff into the lungs daily. 08/29/21  Yes Tanda Rockers, MD  ? ? ?Physical Exam: ?Vitals:  ? 09/25/21 1600 09/25/21 1604 09/25/21 1700 09/25/21 1749  ?BP: (!) 117/54  129/72 (!) 96/41  ?Pulse:    79 79  ?Resp: '18  19 11  '$ ?Temp:  98.2 ?F (36.8 ?C)  97.8 ?F (36.6 ?C)  ?TempSrc:  Oral  Oral  ?SpO2:   100% 100%  ?Weight:      ?Height:      ? ? ?Constitutional: NAD, calm, comfortable ?Eyes: PERRL, lids and conjunctivae normal ?ENMT: Mucous membranes are moist. Posterior pharynx clear of any exudate or lesions.Normal dentition.  ?Neck: normal, supple, no masses, no thyromegaly ?Respiratory: clear to auscultation bilaterally, no wheezing, no crackles. Normal respiratory effort. No accessory muscle use.  ?Cardiovascular: Regular rate and rhythm, no murmurs / rubs / gallops. No extremity edema. 2+ pedal pulses. No carotid bruits.  ?Abdomen: no tenderness, no masses palpated. No hepatosplenomegaly. Bowel sounds positive.  ?Musculoskeletal: no clubbing / cyanosis. No joint deformity upper and lower extremities. Good ROM, no contractures. Normal muscle tone.  ?Skin: Extensive bruising noted under thighs and groin area bilaterally.  Also has bruising on left antecubital fossa ?Neurologic: CN 2-12 grossly intact. Sensation intact, DTR normal. Strength 5/5 in all 4.  ?Psychiatric: Normal judgment and insight. Alert and oriented x  3. Normal mood.  ? ? ?Labs on Admission: I have personally reviewed following labs and imaging studies ? ?CBC: ?Recent Labs  ?Lab 09/20/21 ?1130 09/21/21 ?0455 09/23/21 ?1017 09/25/21 ?1214  ?WBC 21.2* 13.0* 24.3* 25.6*  ?NEUTROABS  --   --   --  13.6*  ?HGB 6.9* 7.3* 8.1* 6.2*  ?HCT 21.8* 22.2* 24.7* 19.8*  ?MCV 82.0 84.1 86.7 88.0  ?PLT 33* 23* 42* 41*  ? ?Basic Metabolic Panel: ?Recent Labs  ?Lab 09/20/21 ?1130 09/21/21 ?0455 09/25/21 ?1214  ?NA 135 138 137  ?K 3.6 3.6 3.6  ?CL 109 116* 110  ?CO2 18* 20* 20*  ?GLUCOSE 102* 105* 96  ?BUN 47* 40* 38*  ?CREATININE 2.54* 2.53* 2.46*  ?CALCIUM 8.9 7.9* 8.8*  ? ?GFR: ?Estimated Creatinine Clearance: 16.1 mL/min (A) (by C-G formula based on SCr of 2.46 mg/dL (H)). ?Liver Function Tests: ?Recent Labs  ?Lab 09/20/21 ?1130 09/25/21 ?1214  ?AST 29 26   ?ALT 18 15  ?ALKPHOS 72 68  ?BILITOT 0.9 1.3*  ?PROT 8.0 6.9  ?ALBUMIN 3.7 3.3*  ? ?No results for input(s): LIPASE, AMYLASE in the last 168 hours. ?No results for input(s): AMMONIA in the last 168 h

## 2021-09-26 DIAGNOSIS — I9589 Other hypotension: Secondary | ICD-10-CM | POA: Diagnosis not present

## 2021-09-26 DIAGNOSIS — D62 Acute posthemorrhagic anemia: Secondary | ICD-10-CM | POA: Diagnosis not present

## 2021-09-26 DIAGNOSIS — N179 Acute kidney failure, unspecified: Secondary | ICD-10-CM | POA: Diagnosis not present

## 2021-09-26 DIAGNOSIS — N1832 Chronic kidney disease, stage 3b: Secondary | ICD-10-CM | POA: Diagnosis not present

## 2021-09-26 LAB — COMPREHENSIVE METABOLIC PANEL
ALT: 13 U/L (ref 0–44)
AST: 21 U/L (ref 15–41)
Albumin: 2.8 g/dL — ABNORMAL LOW (ref 3.5–5.0)
Alkaline Phosphatase: 60 U/L (ref 38–126)
Anion gap: 4 — ABNORMAL LOW (ref 5–15)
BUN: 35 mg/dL — ABNORMAL HIGH (ref 8–23)
CO2: 19 mmol/L — ABNORMAL LOW (ref 22–32)
Calcium: 8.3 mg/dL — ABNORMAL LOW (ref 8.9–10.3)
Chloride: 114 mmol/L — ABNORMAL HIGH (ref 98–111)
Creatinine, Ser: 2.14 mg/dL — ABNORMAL HIGH (ref 0.61–1.24)
GFR, Estimated: 29 mL/min — ABNORMAL LOW (ref >60)
Glucose, Bld: 90 mg/dL (ref 70–99)
Potassium: 3.3 mmol/L — ABNORMAL LOW (ref 3.5–5.1)
Sodium: 137 mmol/L (ref 135–145)
Total Bilirubin: 1.3 mg/dL — ABNORMAL HIGH (ref 0.3–1.2)
Total Protein: 6 g/dL — ABNORMAL LOW (ref 6.5–8.1)

## 2021-09-26 LAB — CBC
HCT: 24.7 % — ABNORMAL LOW (ref 39.0–52.0)
Hemoglobin: 8.2 g/dL — ABNORMAL LOW (ref 13.0–17.0)
MCH: 29.7 pg (ref 26.0–34.0)
MCHC: 33.2 g/dL (ref 30.0–36.0)
MCV: 89.5 fL (ref 80.0–100.0)
Platelets: 32 10*3/uL — ABNORMAL LOW (ref 150–400)
RBC: 2.76 MIL/uL — ABNORMAL LOW (ref 4.22–5.81)
RDW: 19.5 % — ABNORMAL HIGH (ref 11.5–15.5)
WBC: 17.7 10*3/uL — ABNORMAL HIGH (ref 4.0–10.5)
nRBC: 2.3 % — ABNORMAL HIGH (ref 0.0–0.2)

## 2021-09-26 LAB — TYPE AND SCREEN
ABO/RH(D): B NEG
Antibody Screen: NEGATIVE
Unit division: 0
Unit division: 0

## 2021-09-26 LAB — BPAM RBC
Blood Product Expiration Date: 202306092359
Blood Product Expiration Date: 202306102359
ISSUE DATE / TIME: 202305111418
ISSUE DATE / TIME: 202305111831
Unit Type and Rh: 1700
Unit Type and Rh: 1700

## 2021-09-26 LAB — MRSA NEXT GEN BY PCR, NASAL: MRSA by PCR Next Gen: NOT DETECTED

## 2021-09-26 LAB — MAGNESIUM: Magnesium: 1.9 mg/dL (ref 1.7–2.4)

## 2021-09-26 MED ORDER — POTASSIUM CHLORIDE CRYS ER 20 MEQ PO TBCR
40.0000 meq | EXTENDED_RELEASE_TABLET | Freq: Once | ORAL | Status: AC
  Administered 2021-09-26: 40 meq via ORAL
  Filled 2021-09-26: qty 2

## 2021-09-26 MED ORDER — SIMETHICONE 80 MG PO CHEW
160.0000 mg | CHEWABLE_TABLET | Freq: Four times a day (QID) | ORAL | Status: DC | PRN
  Administered 2021-09-26: 160 mg via ORAL
  Filled 2021-09-26: qty 2

## 2021-09-26 NOTE — Care Management Important Message (Signed)
Important Message ? ?Patient Details  ?Name: [name] ?MRN: [id number] ?Date of Birth: [birth year] ? ? ?Medicare Important Message Given:  Yes ? ? ? ? ?[name] ?09/26/2021, 3:50 PM ?

## 2021-09-26 NOTE — Evaluation (Signed)
Physical Therapy Evaluation ?Patient Details ?Name: [name] ?MRN: [id number] ?DOB: August 25, 1929 ?Today's Date: 09/26/2021 ? ?History of Present Illness ? [name] is a 88 y.o. male with medical history significant of myelodysplastic syndrome, chronic kidney disease stage IIIb, COPD, presents to the emergency room from his primary care physician was noted to have low blood pressure and felt generally weak.  Of note, patient does have chronic hypotension and is on midodrine.  He has had difficulties with anemia/thrombocytopenia lately and has required transfusion of PRBCs.  He did have his CBC checked on 5/9 where it was noted that hemoglobin was 8.1.  He said he was feeling generally well at that time, did not have any weakness or lightheadedness.  On that day, he did report that when he was at home, he felt his left knee buckle resulting in a fall to the left side.  He did lay on the ground for extended period of time, but was able to get up and move to his couch.  He did report pain which she took Advil for.  Over the next 2 days, he reports some improvement in his symptoms.  Denies any subsequent dizziness or lightheadedness.  He did not have any shortness of breath.  He went to his primary care physician today to discuss his fall.  When he was evaluated, he was noted to have blood pressure in the 90s and he was feeling lightheaded and dizzy.  He was sent to the ER for evaluation.  He has not noticed any recent melena or hematochezia. ?  ?Clinical Impression ? Patient functioning near baseline for functional mobility and gait other than c/o left hip pain during bed mobility.  Patient demonstrates good return for completing transfers, ambulating in room/hallways using SPC without loss of balance.  Patient tolerated sitting up in chair after therapy - nursing staff aware.  Patient will benefit from continued skilled physical therapy in hospital and recommended venue below to increase strength, balance,  endurance for safe ADLs and gait.  ? ?   ?   ? ?Recommendations for follow up therapy are one component of a multi-disciplinary discharge planning process, led by the attending physician.  Recommendations may be updated based on patient status, additional functional criteria and insurance authorization. ? ?Follow Up Recommendations Home health PT ? ?  ?Assistance Recommended at Discharge PRN  ?Patient can return home with the following ? A little help with walking and/or transfers;A little help with bathing/dressing/bathroom;Help with stairs or ramp for entrance;Assistance with cooking/housework ? ?  ?Equipment Recommendations None recommended by PT  ?Recommendations for Other Services ?    ?  ?Functional Status Assessment Patient has had a recent decline in their functional status and demonstrates the ability to make significant improvements in function in a reasonable and predictable amount of time.  ? ?  ?Precautions / Restrictions Precautions ?Precautions: Fall ?Restrictions ?Weight Bearing Restrictions: No  ? ?  ? ?Mobility ? Bed Mobility ?Overal bed mobility: Modified Independent ?  ?  ?  ?  ?  ?  ?General bed mobility comments: slightly labored movement with c/o pain/discomfort left hip ?  ? ?Transfers ?Overall transfer level: Modified independent ?Equipment used: Straight cane ?  ?  ?  ?  ?  ?  ?  ?  ?  ? ?Ambulation/Gait ?Ambulation/Gait assistance: Modified independent (Device/Increase time), Supervision ?Gait Distance (Feet): 100 Feet ?Assistive device: Straight cane ?Gait Pattern/deviations: WFL(Within Functional Limits), Step-through pattern ?Gait velocity: decreased ?  ?  ?  General Gait Details: slightly labored movement with good return for ambulating in room and hallway with most step-through pattern without loss of balance ? ?Stairs ?  ?  ?  ?  ?  ? ?Wheelchair Mobility ?  ? ?Modified Rankin (Stroke Patients Only) ?  ? ?  ? ?Balance Overall balance assessment: Needs assistance ?Sitting-balance  support: Feet supported, No upper extremity supported ?Sitting balance-Leahy Scale: Good ?Sitting balance - Comments: seated at EOB ?  ?Standing balance support: During functional activity, Single extremity supported ?Standing balance-Leahy Scale: Fair ?Standing balance comment: fair/good using SPC ?  ?  ?  ?  ?  ?  ?  ?  ?  ?  ?  ?   ? ? ? ?Pertinent Vitals/Pain Pain Assessment ?Pain Assessment: 0-10 ?Pain Score: 8  ?Pain Location: left hip ?Pain Descriptors / Indicators: Sore, Grimacing ?Pain Intervention(s): Limited activity within patient's tolerance, Monitored during session, Repositioned  ? ? ?Home Living Family/patient expects to be discharged to:: Private residence ?Living Arrangements: Alone ?Available Help at Discharge: Family;Available PRN/intermittently ?Type of Home: Mobile home ?Home Access: Ramped entrance ?  ?  ?  ?Home Layout: One level ?Home Equipment: Conservation officer, nature (2 wheels);Cane - single point;Electric scooter;BSC/3in1;Shower seat - built in ?   ?  ?Prior Function Prior Level of Function : Independent/Modified Independent ?  ?  ?  ?  ?  ?  ?Mobility Comments: household ambulator using RW, uses SPC for community, drives ?ADLs Comments: Independent ?  ? ? ?Hand Dominance  ? Dominant Hand: Right ? ?  ?Extremity/Trunk Assessment  ? Upper Extremity Assessment ?Upper Extremity Assessment: Overall WFL for tasks assessed ?  ? ?Lower Extremity Assessment ?Lower Extremity Assessment: Generalized weakness ?  ? ?Cervical / Trunk Assessment ?Cervical / Trunk Assessment: Normal  ?Communication  ? Communication: No difficulties  ?Cognition Arousal/Alertness: Awake/alert ?Behavior During Therapy: [facility] for tasks assessed/performed ?Overall Cognitive Status: Within Functional Limits for tasks assessed ?  ?  ?  ?  ?  ?  ?  ?  ?  ?  ?  ?  ?  ?  ?  ?  ?  ?  ?  ? ?  ?General Comments   ? ?  ?Exercises    ? ?Assessment/Plan  ?  ?PT Assessment Patient needs continued PT services  ?PT Problem List Decreased  strength;Decreased activity tolerance;Decreased balance;Pain;Decreased mobility ? ?   ?  ?PT Treatment Interventions DME instruction;Gait training;Stair training;Functional mobility training;Therapeutic activities;Therapeutic exercise;Patient/family education;Balance training   ? ?PT Goals (Current goals can be found in the Care Plan section)  ?Acute Rehab PT Goals ?Patient Stated Goal: return home ?PT Goal Formulation: With patient ?Time For Goal Achievement: 09/30/21 ?Potential to Achieve Goals: Good ? ?  ?Frequency Min 2X/week ?  ? ? ?Co-evaluation   ?  ?  ?  ?  ? ? ?  ?AM-PAC PT "6 Clicks" Mobility  ?Outcome Measure Help needed turning from your back to your side while in a flat bed without using bedrails?: None ?Help needed moving from lying on your back to sitting on the side of a flat bed without using bedrails?: None ?Help needed moving to and from a bed to a chair (including a wheelchair)?: None ?Help needed standing up from a chair using your arms (e.g., wheelchair or bedside chair)?: None ?Help needed to walk in hospital room?: A Little ?Help needed climbing 3-5 steps with a railing? : A Little ?6 Click Score: 22 ? ?  ?End of Session   ?  Activity Tolerance: Patient tolerated treatment well;Patient limited by fatigue ?Patient left: in chair;with call bell/phone within reach ?Nurse Communication: Mobility status ?PT Visit Diagnosis: Unsteadiness on feet (R26.81);Other abnormalities of gait and mobility (R26.89);Muscle weakness (generalized) (M62.81) ?  ? ?Time: 9357-0177 ?PT Time Calculation (min) (ACUTE ONLY): 15 min ? ? ?Charges:   PT Evaluation ?$PT Eval Low Complexity: 1 Low ?PT Treatments ?$Therapeutic Activity: 8-22 mins ?  ?   ? ? ?10:02 AM, 09/26/21 ?Lonell Grandchild, MPT ?Physical Therapist with [facility] ?[facility] ?[phone] office ?[phone] mobile phone ? ? ?

## 2021-09-26 NOTE — Progress Notes (Signed)
?PROGRESS NOTE ? ? ? ?[name]  GYI:[id number] DOB: 1929/12/16 DOA: 09/25/2021 ?PCP: Sharilyn Sites, MD  ? ? ?Brief Narrative:  ?[name] is a 88 y.o. male with medical history significant of myelodysplastic syndrome, chronic kidney disease stage IIIb, COPD, presents to the emergency room from his primary care physician was noted to have low blood pressure and felt generally weak.  Of note, patient does have chronic hypotension and is on midodrine.  He has had difficulties with anemia/thrombocytopenia lately and has required transfusion of PRBCs.  He did have his CBC checked on 5/9 where it was noted that hemoglobin was 8.1.  He said he was feeling generally well at that time, did not have any weakness or lightheadedness.  On that day, he did report that when he was at home, he felt his left knee buckle resulting in a fall to the left side.  He did lay on the ground for extended period of time, but was able to get up and move to his couch.  He did report pain which she took Advil for.  Over the next 2 days, he reports some improvement in his symptoms.  Denies any subsequent dizziness or lightheadedness.  He did not have any shortness of breath.  He went to his primary care physician today to discuss his fall.  When he was evaluated, he was noted to have blood pressure in the 90s and he was feeling lightheaded and dizzy.  He was sent to the ER for evaluation.  He has not noticed any recent melena or hematochezia ? ? ?Assessment & Plan: ?  ?Active Problems: ?  Myelodysplastic syndrome ([place]) ?  Thrombocytopenia ([place]) ?  CKD (chronic kidney disease), stage IIIB ?  Acute on chronic blood loss anemia ?  Body mass index (BMI) 19.9 or less, adult ?  AKI (acute kidney injury) ([place]) ?  Chronic hypotension ?  Pressure injury of skin ?  Fall at home, initial encounter ? ? ?Acute on chronic blood loss anemia ?-Hemoglobin noted to be 8.1 on 5/9 ?-On admission, hemoglobin 6.2 ?-No reported melena or hematochezia ?-He  is noted to have significant bruising along his posterior thighs and groin area bilaterally, likely related to recent fall ?-He has been transfused 2 units PRBC ?-Follow-up hemoglobin improved to 8.2 ?-CT abdomen without any intra-abdominal or retroperitoneal bleed ?-Recheck hemoglobin in a.m. ?  ?Acute kidney injury on chronic kidney disease stage IIIb ?-Baseline creatinine appears to be between 1.5-2 ?-Creatinine on admission noted to be 2.46 ?-This may be exacerbated by recent anemia ?-Creatinine improved to 2.1 with volume resuscitation ?  ?Myelodysplastic syndrome ?-Followed by Dr. Delton Coombes ?-He has a follow-up appointment scheduled for next week ?  ?Chronic hypotension ?-Continue on midodrine ?  ?Fall ?-Reports that his knee buckled causing him to fall, denies any dizziness/loss of consciousness prior to fall ?-Seen by physical therapy with recommendations for home health ?  ?Thrombocytopenia ?-Related to MDS ?-Follow-up with oncology ? ? ?DVT prophylaxis: SCDs Start: 09/25/21 1638 ? ?Code Status: DNR ?Family Communication: Updated patient's daughter at the bedside ?Disposition Plan: Status is: Inpatient ?Remains inpatient appropriate because: Continued monitoring of anemia and blood loss ? ? ? ? ?Consultants:  ? ? ?Procedures:  ? ? ?Antimicrobials:  ?  ? ? ?Subjective: ?He feels better today.  Denies any dizziness or lightheadedness. ? ?Objective: ?Vitals:  ? 09/26/21 1500 09/26/21 1624 09/26/21 1627 09/26/21 1629  ?BP:  109/61 (!) 102/58 94/70  ?Pulse:  76 71 77  ?  Resp:  '20 18 18  '$ ?Temp: 97.9 ?F (36.6 ?C)     ?TempSrc: Oral     ?SpO2:  100% 100% 100%  ?Weight:      ?Height:      ? ? ?Intake/Output Summary (Last 24 hours) at 09/26/2021 2018 ?Last data filed at 09/26/2021 0900 ?Gross per 24 hour  ?Intake 662.5 ml  ?Output 475 ml  ?Net 187.5 ml  ? ?Filed Weights  ? 09/25/21 1139  ?Weight: 55.9 kg  ? ? ?Examination: ? ?General exam: Appears calm and comfortable  ?Respiratory system: Clear to auscultation.  Respiratory effort normal. ?Cardiovascular system: S1 & S2 heard, RRR. No JVD, murmurs, rubs, gallops or clicks. No pedal edema. ?Gastrointestinal system: Abdomen is nondistended, soft and nontender. No organomegaly or masses felt. Normal bowel sounds heard. ?Central nervous system: Alert and oriented. No focal neurological deficits. ?Extremities: Symmetric 5 x 5 power. ?Skin: Extensive bruising noted on bilateral groins and thighs ?Psychiatry: Judgement and insight appear normal. Mood & affect appropriate.  ? ? ? ?Data Reviewed: I have personally reviewed following labs and imaging studies ? ?CBC: ?Recent Labs  ?Lab 09/20/21 ?1130 09/21/21 ?0455 09/23/21 ?1017 09/25/21 ?1214 09/26/21 ?5852  ?WBC 21.2* 13.0* 24.3* 25.6* 17.7*  ?NEUTROABS  --   --   --  13.6*  --   ?HGB 6.9* 7.3* 8.1* 6.2* 8.2*  ?HCT 21.8* 22.2* 24.7* 19.8* 24.7*  ?MCV 82.0 84.1 86.7 88.0 89.5  ?PLT 33* 23* 42* 41* 32*  ? ?Basic Metabolic Panel: ?Recent Labs  ?Lab 09/20/21 ?1130 09/21/21 ?0455 09/25/21 ?1214 09/26/21 ?7782  ?NA 135 138 137 137  ?K 3.6 3.6 3.6 3.3*  ?CL 109 116* 110 114*  ?CO2 18* 20* 20* 19*  ?GLUCOSE 102* 105* 96 90  ?BUN 47* 40* 38* 35*  ?CREATININE 2.54* 2.53* 2.46* 2.14*  ?CALCIUM 8.9 7.9* 8.8* 8.3*  ?MG  --   --   --  1.9  ? ?GFR: ?Estimated Creatinine Clearance: 18.1 mL/min (A) (by C-G formula based on SCr of 2.14 mg/dL (H)). ?Liver Function Tests: ?Recent Labs  ?Lab 09/20/21 ?1130 09/25/21 ?1214 09/26/21 ?4235  ?AST '29 26 21  '$ ?ALT '18 15 13  '$ ?ALKPHOS 72 68 60  ?BILITOT 0.9 1.3* 1.3*  ?PROT 8.0 6.9 6.0*  ?ALBUMIN 3.7 3.3* 2.8*  ? ?No results for input(s): LIPASE, AMYLASE in the last 168 hours. ?No results for input(s): AMMONIA in the last 168 hours. ?Coagulation Profile: ?No results for input(s): INR, PROTIME in the last 168 hours. ?Cardiac Enzymes: ?No results for input(s): CKTOTAL, CKMB, CKMBINDEX, TROPONINI in the last 168 hours. ?BNP (last 3 results) ?No results for input(s): PROBNP in the last 8760 hours. ?HbA1C: ?No  results for input(s): HGBA1C in the last 72 hours. ?CBG: ?No results for input(s): GLUCAP in the last 168 hours. ?Lipid Profile: ?No results for input(s): CHOL, HDL, LDLCALC, TRIG, CHOLHDL, LDLDIRECT in the last 72 hours. ?Thyroid Function Tests: ?No results for input(s): TSH, T4TOTAL, FREET4, T3FREE, THYROIDAB in the last 72 hours. ?Anemia Panel: ?No results for input(s): VITAMINB12, FOLATE, FERRITIN, TIBC, IRON, RETICCTPCT in the last 72 hours. ?Sepsis Labs: ?Recent Labs  ?Lab 09/25/21 ?1214 09/25/21 ?1410  ?LATICACIDVEN 2.0* 2.0*  ? ? ?Recent Results (from the past 240 hour(s))  ?Culture, blood (Routine X 2) w Reflex to ID Panel     Status: None (Preliminary result)  ? Collection Time: 09/25/21 12:14 PM  ? Specimen: BLOOD LEFT ARM  ?Result Value Ref Range Status  ? Specimen Description BLOOD LEFT  ARM  Final  ? Special Requests   Final  ?  BOTTLES DRAWN AEROBIC AND ANAEROBIC Blood Culture adequate volume  ? Culture   Final  ?  NO GROWTH < 24 HOURS ?Performed at [facility], [address], [place], [place] [address] ?  ? Report Status PENDING  Incomplete  ?Culture, blood (Routine X 2) w Reflex to ID Panel     Status: None (Preliminary result)  ? Collection Time: 09/25/21 12:14 PM  ? Specimen: BLOOD LEFT HAND  ?Result Value Ref Range Status  ? Specimen Description BLOOD LEFT HAND  Final  ? Special Requests   Final  ?  BOTTLES DRAWN AEROBIC AND ANAEROBIC Blood Culture results may not be optimal due to an inadequate volume of blood received in culture bottles  ? Culture   Final  ?  NO GROWTH < 24 HOURS ?Performed at [name], [address], [place], [place] [address] ?  ? Report Status PENDING  Incomplete  ?MRSA Next Gen by PCR, Nasal     Status: None  ? Collection Time: 09/25/21  3:00 PM  ? Specimen: Nasal Mucosa; Nasal Swab  ?Result Value Ref Range Status  ? MRSA by PCR Next Gen NOT DETECTED NOT DETECTED Final  ?  Comment: (NOTE) ?The GeneXpert MRSA Assay (FDA approved for NASAL specimens only), ?is one  component of a comprehensive MRSA colonization surveillance ?program. It is not intended to diagnose MRSA infection nor to guide ?or monitor treatment for MRSA infections. ?Test performance is not FDA approved in patien

## 2021-09-26 NOTE — TOC Initial Note (Signed)
Transition of Care (TOC) - Initial/Assessment Note  ? ? ?Patient Details  ?Name: [name] ?MRN: [id number] ?Date of Birth: [birth year] ? ?Transition of Care (TOC) CM/SW Contact:    ?Boneta Lucks, RN ?Phone Number: ?09/26/2021, 1:36 PM ? ?Clinical Narrative:     Patient admitted with pressure injury, fall, weakness. Patient has a high risk for readmission. TOC spoke with his daughter Stanton Kidney. Patient lives home alone walks with a cane and drives himself to appointments. PT is recommending HHPT. She is agreeable. MD order HHRN/PT. Vaughan Basta with [facility] accepted the referral.             ? ? ?Expected Discharge Plan: [place] ?Barriers to Discharge: Continued Medical Work up ? ? ?Patient Goals and CMS Choice ?Patient states their goals for this hospitalization and ongoing recovery are:: to go home. ?CMS Medicare.gov Compare Post Acute Care list provided to:: Patient Represenative (must comment) ?Choice offered to / list presented to : Adult Children ? ?Expected Discharge Plan and Services ?Expected Discharge Plan: [place] ?  ?  ?Post Acute Care Choice: Home Health ?Living arrangements for the past 2 months: [place] ?               ?    ?HH Arranged: PT, RN ?[place] Agency: [place] ([place]) ?Date HH Agency Contacted: 09/26/21 ?Time [place]: 1336 ?Representative spoke with at [place]: Romualdo Bolk ? ?Prior Living Arrangements/Services ?Living arrangements for the past 2 months: [place] ?Lives with:: Self ?Patient language and need for interpreter reviewed:: Yes ?Do you feel safe going back to the place where you live?: Yes      ?Need for Family Participation in Patient Care: Yes (Comment) ?Care giver support system in place?: Yes (comment) ?Current home services: DME ?Criminal Activity/Legal Involvement Pertinent to Current Situation/Hospitalization: No - Comment as needed ? ?Activities of Daily Living ?Home Assistive  Devices/Equipment: Cane (specify quad or straight), Walker (specify type), Oxygen ?ADL Screening (condition at time of admission) ?Patient's cognitive ability adequate to safely complete daily activities?: Yes ?Is the patient deaf or have difficulty hearing?: No ?Does the patient have difficulty seeing, even when wearing glasses/contacts?: No ?Does the patient have difficulty concentrating, remembering, or making decisions?: No ?Patient able to express need for assistance with ADLs?: Yes ?Does the patient have difficulty dressing or bathing?: No ?Independently performs ADLs?: Yes (appropriate for developmental age) ?Does the patient have difficulty walking or climbing stairs?: Yes ?Weakness of Legs: Both ?Weakness of Arms/Hands: None ? ?Permission Sought/Granted ?  ? Emotional Assessment ?  ?  ?Affect (typically observed): Accepting ?Orientation: : Oriented to Self, Oriented to Place, Oriented to  Time, Oriented to Situation ?Alcohol / Substance Use: Not Applicable ?Psych Involvement: No (comment) ? ?Admission diagnosis:  Anemia [D64.9] ?Patient Active Problem List  ? Diagnosis Date Noted  ? Pressure injury of skin 09/25/2021  ? Fall at home, initial encounter 09/25/2021  ? Symptomatic anemia 09/20/2021  ? Chronic hypotension 09/20/2021  ? Small bowel bleed requiring more than 4 units of blood in 24 hours, ICU, or surgery 09/15/2021  ? Pulmonary infiltrates on CXR 08/30/2021  ? Hemorrhage from open wound of left chest wall 08/12/2021  ? Severe sepsis ([place]) 08/09/2021  ? AKI (acute kidney injury) ([place]) 08/09/2021  ? Infected venous access port, initial encounter 08/07/2021  ? Acute on chronic respiratory failure with hypoxia ([place]) 07/22/2021  ? Hypokalemia 07/22/2021  ? Melena   ?  Goals of care, counseling/discussion 07/15/2021  ? Protein-calorie malnutrition, moderate ([place]) 07/14/2021  ? Heme positive stool 07/13/2021  ? Acute on chronic anemia 07/12/2021  ? Near syncope 06/17/2021  ? Neoplastic malignant related  fatigue 01/21/2021  ? Depression 12/02/2020  ? 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  ? Hematuria 12/02/2020  ? Osteoporosis 12/02/2020  ? Low back pain 12/02/2020  ? Syncope 11/11/2020  ? COVID-19 virus infection 11/11/2020  ? COPD Group B  clinically, mild/mod severity  04/03/2020  ? Acute on chronic blood loss anemia 04/03/2020  ? 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 04/03/2020  ? Urinary frequency 02/26/2020  ? Benign prostatic hyperplasia with urinary obstruction 02/26/2020  ? Myelodysplastic syndrome ([place]) 06/16/2019  ? HAP (hospital-acquired pneumonia) 06/16/2019  ? CKD (chronic kidney disease), stage IIIB 06/16/2019  ? Sepsis ([place]) 06/16/2019  ? Thrombocytopenia ([place]) 06/06/2019  ? Intractable back pain 06/06/2019  ? Chronic kidney insufficiency   ? Body mass index (BMI) 19.9 or less, adult 04/20/2019  ? Compression fracture of lumbar vertebra ([place]) 04/20/2019  ? Elevated blood-pressure reading, without diagnosis of hypertension 04/20/2019  ? Acute low back pain 03/23/2019  ? Pulmonary nodule 05/19/2018  ? Bronchiectasis ([place]) 05/19/2018  ? Anemia 09/17/2017  ? Abnormal CT of the abdomen 11/02/2016  ? Loss of weight 11/02/2016  ? History of colonic polyps 11/02/2016  ? GERD (gastroesophageal reflux disease) 10/01/2015  ? ?PCP:  Sharilyn Sites, MD ?Pharmacy:   ?[facility] [address] ?[place] [address] ?Phone: [phone] Fax: [phone] ? ? ?Readmission Risk Interventions ? ?  09/26/2021  ?  1:35 PM 08/11/2021  ? 10:26 AM 07/23/2021  ? 11:04 AM  ?Readmission Risk Prevention Plan  ?Transportation Screening Complete Complete Complete  ?[place] or Home Care Consult   Complete  ?Social Work Consult for [place] Planning/Counseling   Complete  ?Palliative Care Screening   Complete  ?Medication Review Press photographer) Complete Complete Complete  ?PCP or Specialist appointment within  3-5 days of discharge Not Complete    ?[place] or Home Care Consult Complete Complete   ?SW Recovery Care/Counseling Consult  Complete   ?Palliative Care Screening  Not Applicable   ?[place]  Not Applicable   ? ? ? ?

## 2021-09-26 NOTE — Plan of Care (Signed)
?  Problem: Acute Rehab PT Goals(only PT should resolve) ?Goal: Pt Will Go Supine/Side To Sit ?Outcome: Progressing ?Flowsheets (Taken 09/26/2021 1005) ?Pt will go Supine/Side to Sit: ? Independently ? with modified independence ?Goal: Patient Will Transfer Sit To/From Stand ?Outcome: Progressing ?Flowsheets (Taken 09/26/2021 1005) ?Patient will transfer sit to/from stand: ? Independently ? with modified independence ?Goal: Pt Will Transfer Bed To Chair/Chair To Bed ?Outcome: Progressing ?Flowsheets (Taken 09/26/2021 1005) ?Pt will Transfer Bed to Chair/Chair to Bed: with modified independence ?Goal: Pt Will Ambulate ?Outcome: Progressing ?Flowsheets (Taken 09/26/2021 1005) ?Pt will Ambulate: ? > 125 feet ? with modified independence ? with cane ?  ?10:06 AM, 09/26/21 ?Lonell Grandchild, MPT ?Physical Therapist with [place] ?[facility] ?[phone] office ?[phone] mobile phone ? ?

## 2021-09-27 DIAGNOSIS — D649 Anemia, unspecified: Secondary | ICD-10-CM | POA: Diagnosis not present

## 2021-09-27 DIAGNOSIS — D62 Acute posthemorrhagic anemia: Secondary | ICD-10-CM | POA: Diagnosis not present

## 2021-09-27 DIAGNOSIS — I9589 Other hypotension: Secondary | ICD-10-CM | POA: Diagnosis not present

## 2021-09-27 DIAGNOSIS — N179 Acute kidney failure, unspecified: Secondary | ICD-10-CM | POA: Diagnosis not present

## 2021-09-27 LAB — CBC
HCT: 25.2 % — ABNORMAL LOW (ref 39.0–52.0)
Hemoglobin: 8.2 g/dL — ABNORMAL LOW (ref 13.0–17.0)
MCH: 29.4 pg (ref 26.0–34.0)
MCHC: 32.5 g/dL (ref 30.0–36.0)
MCV: 90.3 fL (ref 80.0–100.0)
Platelets: 31 10*3/uL — ABNORMAL LOW (ref 150–400)
RBC: 2.79 MIL/uL — ABNORMAL LOW (ref 4.22–5.81)
RDW: 20.1 % — ABNORMAL HIGH (ref 11.5–15.5)
WBC: 16.4 10*3/uL — ABNORMAL HIGH (ref 4.0–10.5)
nRBC: 1.9 % — ABNORMAL HIGH (ref 0.0–0.2)

## 2021-09-27 NOTE — Discharge Summary (Addendum)
Physician Discharge Summary  [name] LFY:[id number] DOB: Feb 12, 1930 DOA: 09/25/2021  PCP: Sharilyn Sites, MD  Admit date: 09/25/2021 Discharge date: 09/27/2021  Admitted From: Home Disposition: Home  Recommendations for Outpatient Follow-up:  Follow up with PCP in 1-2 weeks Please obtain BMP/CBC in one week Follow-up with Dr. Delton Coombes on Tuesday as previously scheduled  Home Health: Home health PT, RN Equipment/Devices:  Discharge Condition: Stable CODE STATUS: DNR Diet recommendation: Heart healthy  Brief/Interim Summary: [name] is a 88 y.o. male with medical history significant of myelodysplastic syndrome, chronic kidney disease stage IIIb, COPD, presents to the emergency room from his primary care physician was noted to have low blood pressure and felt generally weak.  Of note, patient does have chronic hypotension and is on midodrine.  He has had difficulties with anemia/thrombocytopenia lately and has required transfusion of PRBCs.  He did have his CBC checked on 5/9 where it was noted that hemoglobin was 8.1.  He said he was feeling generally well at that time, did not have any weakness or lightheadedness.  On that day, he did report that when he was at home, he felt his left knee buckle resulting in a fall to the left side.  He did lay on the ground for extended period of time, but was able to get up and move to his couch.  He did report pain which she took Advil for.  Over the next 2 days, he reports some improvement in his symptoms.  Denies any subsequent dizziness or lightheadedness.  He did not have any shortness of breath.  He went to his primary care physician today to discuss his fall.  When he was evaluated, he was noted to have blood pressure in the 90s and he was feeling lightheaded and dizzy.  He was sent to the ER for evaluation.  He has not noticed any recent melena or hematochezia  Discharge Diagnoses:  Active Problems:   Myelodysplastic syndrome  (HCC)   Thrombocytopenia (HCC)   CKD (chronic kidney disease), stage IIIB   Acute on chronic blood loss anemia   Body mass index (BMI) 19.9 or less, adult   AKI (acute kidney injury) ([place])   Chronic hypotension   Pressure injury of skin   Fall at home, initial encounter  Acute on chronic blood loss anemia -Hemoglobin noted to be 8.1 on 5/9 -On admission, hemoglobin 6.2 -No reported melena or hematochezia -He is noted to have significant bruising along his posterior thighs and groin area bilaterally, likely related to recent fall -He has been transfused 2 units PRBC -Follow-up hemoglobin improved to 8.2 and remained stable thereafter -CT abdomen without any intra-abdominal or retroperitoneal bleed -He can have repeat hemoglobin check on follow-up with hematology, next appointment on 5/16    Acute kidney injury on chronic kidney disease stage IIIb -Baseline creatinine appears to be between 1.5-2 -Creatinine on admission noted to be 2.46 -This may be exacerbated by recent anemia -Creatinine improved to 2.1 with volume resuscitation   Myelodysplastic syndrome -Followed by Dr. Delton Coombes -He has a follow-up appointment scheduled for next week   Chronic hypotension -Continue on midodrine   Fall -Reports that his knee buckled causing him to fall, denies any dizziness/loss of consciousness prior to fall -Seen by physical therapy with recommendations for home health   Thrombocytopenia -Related to MDS -Follow-up with oncology  Underweight BMI 14.7  Discharge Instructions  Discharge Instructions     Diet - low sodium heart healthy   Complete by: As  directed    Increase activity slowly   Complete by: As directed    No wound care   Complete by: As directed       Allergies as of 09/27/2021   No Known Allergies      Medication List     TAKE these medications    albuterol 108 (90 Base) MCG/ACT inhaler Commonly known as: VENTOLIN HFA Inhale 2 puffs into the lungs  every 4 (four) hours as needed for wheezing or shortness of breath.   Anoro Ellipta 62.5-25 MCG/ACT Aepb Generic drug: umeclidinium-vilanterol Inhale 1 puff into the lungs daily.   midodrine 5 MG tablet Commonly known as: PROAMATINE Take 1 tablet (5 mg total) by mouth 3 (three) times daily with meals.   predniSONE 5 MG tablet Commonly known as: DELTASONE Take 5 mg by mouth daily with breakfast.        Follow-up Information     [facility] Follow [facility].   Specialty: Home Health Services Why: They will call to schedule your first home visit.               No Known Allergies  Consultations:    Procedures/Studies: CT ABDOMEN PELVIS WO CONTRAST  Result Date: 09/25/2021 CLINICAL DATA:  Weakness and anemia.  Fall on left side 09/22/2021 EXAM: CT ABDOMEN AND PELVIS WITHOUT CONTRAST TECHNIQUE: Multidetector CT imaging of the abdomen and pelvis was performed following the standard protocol without IV contrast. RADIATION DOSE REDUCTION: This exam was performed according to the departmental dose-optimization program which includes automated exposure control, adjustment of the mA and/or kV according to patient size and/or use of iterative reconstruction technique. COMPARISON:  CT 09/23/2016 FINDINGS: Lower chest: Basilar emphysema. Patchy consolidation within the left greater than right lower lobes suspicious for pneumonia, including aspiration. Trace left pleural effusion. Hepatobiliary: There is no evidence of focal hepatic abnormality on this unenhanced exam. Gallbladder physiologically distended, no calcified stone. No biliary dilatation. Pancreas: No ductal dilatation or inflammation. Normal for age pancreatic atrophy. Spleen: The spleen measures 12.1 x 7.7 x 9.8 cm (volume = 480 cm^3), mildly enlarged. No focal abnormality or perisplenic fluid on this unenhanced exam. Adrenals/Urinary Tract: No adrenal nodule. There is no hydronephrosis. There is left renal  parenchymal thinning. Multiple bilateral cysts of varying sizes in density, incompletely characterized on this unenhanced exam. These are grossly unchanged from 2018 exam, and no specific imaging follow-up is recommended. Unremarkable urinary bladder. Stomach/Bowel: There is ingested material distending the stomach. Occasional fluid-filled small bowel without obstruction or inflammation. Moderate volume of colonic stool. No colonic wall thickening or inflammatory change. Left colonic diverticulosis, prominent in the sigmoid, no diverticulitis. Vascular/Lymphatic: Aortic atherosclerosis. No aortic aneurysm. There is no periaortic stranding. No retroperitoneal fluid. No bulky abdominopelvic adenopathy. Reproductive: Prostatic calcifications. Other: There is soft tissue edema involving the greater than right flank subcutaneous tissues dependently, as well as midline skin thickening. Edematous changes extend over the left hip where there is also enlargement of the left gluteus muscles as described below. There is no free air or free fluid within the abdomen or pelvis. The right testis may be located in the inguinal canal. Musculoskeletal: Enlargement and heterogeneous left gluteal musculature may be related to hematoma in the setting of recent fall. There is overlying stranding of the left lateral hip and flank soft tissues. No evidence of retroperitoneal hematoma. The bones are diffusely under mineralized. Allowing for this limitation, no acute fracture of the pelvis, hip, or included ribs. There are chronic compression  fractures of T11 and L2 with vertebral augmentation. Chronic compression deformity of L3, L4, and L5, not significantly changed from 06/05/2019 lumbar radiographs. IMPRESSION: 1. Enlargement and heterogeneous left gluteal musculature may be related to hematoma in the setting of recent fall. There is overlying stranding of the left lateral hip and flank soft tissues. No pelvis or hip fracture. 2. No  evidence of retroperitoneal hematoma. 3. Patchy consolidation within the left greater than right lower lobes suspicious for pneumonia, including aspiration. Trace left pleural effusion. 4. Colonic diverticulosis without diverticulitis. 5. Mild splenomegaly. 6. Multiple chronic compression fractures of the lower thoracic and lumbar spine. Aortic Atherosclerosis (ICD10-I70.0) and Emphysema (ICD10-J43.9). Electronically Signed   By: Keith Rake M.D.   On: 09/25/2021 19:17   DG Chest 2 View  Result Date: 08/29/2021 CLINICAL DATA:  88 year old male with dyspnea on exertion EXAM: CHEST - 2 VIEW COMPARISON:  08/09/2021 CT 02/15/2021 FINDINGS: Cardiomediastinal silhouette unchanged in size and contour. Pleuroparenchymal thickening at the apices. Stigmata of emphysema, with increased retrosternal airspace, flattened hemidiaphragms, increased AP diameter, and hyperinflation on the AP view. Redemonstration of diffuse architectural distortion and interstitial opacities. Compared to the prior there are new reticulonodular or changes and patchy airspace disease in the lower lungs. No pneumothorax or pleural effusion. Degenerative changes the spine with similar configuration compared to the prior CT IMPRESSION: Advanced emphysema and chronic fibrosis/scarring with new superimposed multifocal infection. Electronically Signed   By: Corrie Mckusick D.O.   On: 08/29/2021 16:28   DG Chest Port 1 View  Result Date: 09/25/2021 CLINICAL DATA:  Shortness of breath EXAM: PORTABLE CHEST 1 VIEW COMPARISON:  Chest x-ray dated Sep 20, 2021 FINDINGS: Cardiac and mediastinal contours are unchanged. Bilateral reticular opacities, most prominent in the lung bases, similar prior exams and likely due to background emphysema and scarring. No new focal consolidation. Previously described right lung opacity is no longer visible. No large pleural effusion or pneumothorax. IMPRESSION: No new focal consolidation. Previously described right lung  opacity is no longer visible, likely resolved atelectasis. Electronically Signed   By: Yetta Glassman M.D.   On: 09/25/2021 12:11   DG Chest Port 1 View  Result Date: 09/20/2021 CLINICAL DATA:  Weakness. EXAM: PORTABLE CHEST 1 VIEW COMPARISON:  08/29/2021 and prior studies FINDINGS: Cardiomediastinal silhouette is unchanged. Emphysema and chronic interstitial opacities are again identified. Focal opacity overlying the RIGHT mid lung is not significantly changed and may represent infection/pneumonia but radiographic follow-up to resolution is recommended. There is no evidence of pneumothorax or large pleural effusion. No acute bony abnormalities are identified. Vertebral augmentation changes are again noted. IMPRESSION: 1. Unchanged RIGHT mid lung opacity which may represent infection/pneumonia. Radiographic follow-up to resolution is recommended. 2. Emphysema and chronic interstitial opacities. Electronically Signed   By: Margarette Canada M.D.   On: 09/20/2021 11:48      Subjective: Denies any complaints today.  Eager to discharge home  Discharge Exam: Vitals:   09/27/21 0000 09/27/21 0239 09/27/21 0619 09/27/21 0727  BP: (!) 91/49 116/75 104/63   Pulse: 72 72 74   Resp: '16 20 16   '$ Temp:  98 F (36.7 C) 98 F (36.7 C)   TempSrc:   Oral   SpO2: 98% 95% 95% 92%  Weight:      Height:        General: Pt is alert, awake, not in acute distress Cardiovascular: RRR, S1/S2 +, no rubs, no gallops Respiratory: CTA bilaterally, no wheezing, no rhonchi Abdominal: Soft, NT, ND, bowel sounds +  Extremities: no edema, overall bruising is improving    The results of significant diagnostics from this hospitalization (including imaging, microbiology, ancillary and laboratory) are listed below for reference.     Microbiology: Recent Results (from the past 240 hour(s))  Culture, blood (Routine X 2) w Reflex to ID Panel     Status: None (Preliminary result)   Collection Time: 09/25/21 12:14 PM    Specimen: BLOOD LEFT ARM  Result Value Ref Range Status   Specimen Description BLOOD LEFT ARM  Final   Special Requests   Final    BOTTLES DRAWN AEROBIC AND ANAEROBIC Blood Culture adequate volume   Culture   Final    NO GROWTH 2 DAYS Performed at [facility] [address], [place], [place] [address]    Report Status PENDING  Incomplete  Culture, blood (Routine X 2) w Reflex to ID Panel     Status: None (Preliminary result)   Collection Time: 09/25/21 12:14 PM   Specimen: BLOOD LEFT HAND  Result Value Ref Range Status   Specimen Description BLOOD LEFT HAND  Final   Special Requests   Final    BOTTLES DRAWN AEROBIC AND ANAEROBIC Blood Culture results may not be optimal due to an inadequate volume of blood received in culture bottles   Culture   Final    NO GROWTH 2 DAYS Performed at [facility], [address] [place], [place] [address]    Report Status PENDING  Incomplete  MRSA Next Gen by PCR, Nasal     Status: None   Collection Time: 09/25/21  3:00 PM   Specimen: Nasal Mucosa; Nasal Swab  Result Value Ref Range Status   MRSA by PCR Next Gen NOT DETECTED NOT DETECTED Final    Comment: (NOTE) The GeneXpert MRSA Assay (FDA approved for NASAL specimens only), is one component of a comprehensive MRSA colonization surveillance program. It is not intended to diagnose MRSA infection nor to guide or monitor treatment for MRSA infections. Test performance is not FDA approved in patients less than 32 years old. Performed at [facility], [address], [place], [place] [address]      Labs: BNP (last 3 results) Recent Labs    02/15/21 1946 06/16/21 1135  BNP 231.0* 72.5   Basic Metabolic Panel: Recent Labs  Lab 09/21/21 0455 09/25/21 1214 09/26/21 0348  NA 138 137 137  K 3.6 3.6 3.3*  CL 116* 110 114*  CO2 20* 20* 19*  GLUCOSE 105* 96 90  BUN 40* 38* 35*  CREATININE 2.53* 2.46* 2.14*  CALCIUM 7.9* 8.8* 8.3*  MG  --   --  1.9   Liver Function Tests: Recent Labs   Lab 09/25/21 1214 09/26/21 0348  AST 26 21  ALT 15 13  ALKPHOS 68 60  BILITOT 1.3* 1.3*  PROT 6.9 6.0*  ALBUMIN 3.3* 2.8*   No results for input(s): LIPASE, AMYLASE in the last 168 hours. No results for input(s): AMMONIA in the last 168 hours. CBC: Recent Labs  Lab 09/21/21 0455 09/23/21 1017 09/25/21 1214 09/26/21 0348 09/27/21 0502  WBC 13.0* 24.3* 25.6* 17.7* 16.4*  NEUTROABS  --   --  13.6*  --   --   HGB 7.3* 8.1* 6.2* 8.2* 8.2*  HCT 22.2* 24.7* 19.8* 24.7* 25.2*  MCV 84.1 86.7 88.0 89.5 90.3  PLT 23* 42* 41* 32* 31*   Cardiac Enzymes: No results for input(s): CKTOTAL, CKMB, CKMBINDEX, TROPONINI in the last 168 hours. BNP: Invalid input(s): POCBNP CBG: No  results for input(s): GLUCAP in the last 168 hours. D-Dimer No results for input(s): DDIMER in the last 72 hours. Hgb A1c No results for input(s): HGBA1C in the last 72 hours. Lipid Profile No results for input(s): CHOL, HDL, LDLCALC, TRIG, CHOLHDL, LDLDIRECT in the last 72 hours. Thyroid function studies No results for input(s): TSH, T4TOTAL, T3FREE, THYROIDAB in the last 72 hours.  Invalid input(s): FREET3 Anemia work up No results for input(s): VITAMINB12, FOLATE, FERRITIN, TIBC, IRON, RETICCTPCT in the last 72 hours. Urinalysis    Component Value Date/Time   COLORURINE YELLOW 09/25/2021 1151   APPEARANCEUR CLEAR 09/25/2021 1151   APPEARANCEUR Hazy (A) 03/26/2021 1334   LABSPEC 1.011 09/25/2021 1151   PHURINE 5.0 09/25/2021 1151   GLUCOSEU NEGATIVE 09/25/2021 1151   HGBUR SMALL (A) 09/25/2021 1151   BILIRUBINUR NEGATIVE 09/25/2021 1151   BILIRUBINUR Negative 03/26/2021 1334   KETONESUR NEGATIVE 09/25/2021 1151   PROTEINUR 30 (A) 09/25/2021 1151   UROBILINOGEN 0.2 08/15/2013 1240   NITRITE NEGATIVE 09/25/2021 1151   LEUKOCYTESUR NEGATIVE 09/25/2021 1151   Sepsis Labs Invalid input(s): PROCALCITONIN,  WBC,  LACTICIDVEN Microbiology Recent Results (from the past 240 hour(s))  Culture, blood  (Routine X 2) w Reflex to ID Panel     Status: None (Preliminary result)   Collection Time: 09/25/21 12:14 PM   Specimen: BLOOD LEFT ARM  Result Value Ref Range Status   Specimen Description BLOOD LEFT ARM  Final   Special Requests   Final    BOTTLES DRAWN AEROBIC AND ANAEROBIC Blood Culture adequate volume   Culture   Final    NO GROWTH 2 DAYS Performed at [facility], [address] [place], [place] [address]    Report Status PENDING  Incomplete  Culture, blood (Routine X 2) w Reflex to ID Panel     Status: None (Preliminary result)   Collection Time: 09/25/21 12:14 PM   Specimen: BLOOD LEFT HAND  Result Value Ref Range Status   Specimen Description BLOOD LEFT HAND  Final   Special Requests   Final    BOTTLES DRAWN AEROBIC AND ANAEROBIC Blood Culture results may not be optimal due to an inadequate volume of blood received in culture bottles   Culture   Final    NO GROWTH 2 DAYS Performed at [facility], [address], [place], [place] [address]    Report Status PENDING  Incomplete  MRSA Next Gen by PCR, Nasal     Status: None   Collection Time: 09/25/21  3:00 PM   Specimen: Nasal Mucosa; Nasal Swab  Result Value Ref Range Status   MRSA by PCR Next Gen NOT DETECTED NOT DETECTED Final    Comment: (NOTE) The GeneXpert MRSA Assay (FDA approved for NASAL specimens only), is one component of a comprehensive MRSA colonization surveillance program. It is not intended to diagnose MRSA infection nor to guide or monitor treatment for MRSA infections. Test performance is not FDA approved in patients less than 60 years old. Performed at [facility], [address], [place], [place] [address]      Time coordinating discharge: 62mns  SIGNED:   JKathie Dike MD  [facility] 09/27/2021, 7:58 PM   If 7PM-7AM, please contact night-coverage [email or web address]

## 2021-09-27 NOTE — Progress Notes (Signed)
Nsg Discharge Note ? ?Admit Date:  09/25/2021 ?Discharge date: 09/27/2021 ?  ?Cletus Gash Donica to be D/C'd Home per MD order.  AVS completed.  Copy for chart, and copy for patient signed, and dated. ?Patient/caregiver able to verbalize understanding. ? ?Discharge Medication: ?Allergies as of 09/27/2021   ?No Known Allergies ?  ? ?  ?Medication List  ?  ? ?TAKE these medications   ? ?albuterol 108 (90 Base) MCG/ACT inhaler ?Commonly known as: VENTOLIN HFA ?Inhale 2 puffs into the lungs every 4 (four) hours as needed for wheezing or shortness of breath. ?  ?Anoro Ellipta 62.5-25 MCG/ACT Aepb ?Generic drug: umeclidinium-vilanterol ?Inhale 1 puff into the lungs daily. ?  ?midodrine 5 MG tablet ?Commonly known as: PROAMATINE ?Take 1 tablet (5 mg total) by mouth 3 (three) times daily with meals. ?  ?predniSONE 5 MG tablet ?Commonly known as: DELTASONE ?Take 5 mg by mouth daily with breakfast. ?  ? ?  ? ? ?Discharge Assessment: ?Vitals:  ? 09/27/21 0619 09/27/21 0727  ?BP: 104/63   ?Pulse: 74   ?Resp: 16   ?Temp: 98 ?F (36.7 ?C)   ?SpO2: 95% 92%  ? Skin clean, dry and intact without evidence of skin break down, no evidence of skin tears noted. ?IV catheter discontinued intact. Site without signs and symptoms of complications - no redness or edema noted at insertion site, patient denies c/o pain - only slight tenderness at site.  Dressing with slight pressure applied. ? ?D/c Instructions-Education: ?Discharge instructions given to patient/family with verbalized understanding. ?D/c education completed with patient/family including follow up instructions, medication list, d/c activities limitations if indicated, with other d/c instructions as indicated by MD - patient able to verbalize understanding, all questions fully answered. ?Patient instructed to return to ED, call 911, or call MD for any changes in condition.  ?Patient escorted via [place], and D/C home via private auto. ? ?Clovis Fredrickson, LPN ?4/56/2563 89:37 AM  ?

## 2021-09-29 ENCOUNTER — Inpatient Hospital Stay (HOSPITAL_COMMUNITY)
Admission: EM | Admit: 2021-09-29 | Discharge: 2021-10-03 | DRG: 803 | Disposition: A | Discharge status: Skilled Nursing Facility | Payer: Medicare Other | Attending: Internal Medicine | Admitting: Internal Medicine

## 2021-09-29 ENCOUNTER — Other Ambulatory Visit: Payer: Self-pay

## 2021-09-29 ENCOUNTER — Inpatient Hospital Stay (HOSPITAL_COMMUNITY): Payer: Medicare Other

## 2021-09-29 ENCOUNTER — Emergency Department (HOSPITAL_COMMUNITY): Payer: Medicare Other

## 2021-09-29 ENCOUNTER — Encounter (HOSPITAL_COMMUNITY): Payer: Self-pay | Admitting: *Deleted

## 2021-09-29 DIAGNOSIS — J439 Emphysema, unspecified: Secondary | ICD-10-CM | POA: Diagnosis present

## 2021-09-29 DIAGNOSIS — Z66 Do not resuscitate: Secondary | ICD-10-CM | POA: Diagnosis present

## 2021-09-29 DIAGNOSIS — W19XXXA Unspecified fall, initial encounter: Secondary | ICD-10-CM | POA: Diagnosis present

## 2021-09-29 DIAGNOSIS — Z7401 Bed confinement status: Secondary | ICD-10-CM | POA: Diagnosis not present

## 2021-09-29 DIAGNOSIS — N1832 Chronic kidney disease, stage 3b: Secondary | ICD-10-CM

## 2021-09-29 DIAGNOSIS — Z7952 Long term (current) use of systemic steroids: Secondary | ICD-10-CM | POA: Diagnosis not present

## 2021-09-29 DIAGNOSIS — W19XXXD Unspecified fall, subsequent encounter: Secondary | ICD-10-CM | POA: Diagnosis present

## 2021-09-29 DIAGNOSIS — Z681 Body mass index (BMI) 19 or less, adult: Secondary | ICD-10-CM

## 2021-09-29 DIAGNOSIS — I9589 Other hypotension: Secondary | ICD-10-CM | POA: Diagnosis not present

## 2021-09-29 DIAGNOSIS — Z9842 Cataract extraction status, left eye: Secondary | ICD-10-CM

## 2021-09-29 DIAGNOSIS — J9621 Acute and chronic respiratory failure with hypoxia: Secondary | ICD-10-CM | POA: Diagnosis present

## 2021-09-29 DIAGNOSIS — S7002XD Contusion of left hip, subsequent encounter: Secondary | ICD-10-CM | POA: Diagnosis not present

## 2021-09-29 DIAGNOSIS — D6959 Other secondary thrombocytopenia: Secondary | ICD-10-CM | POA: Diagnosis present

## 2021-09-29 DIAGNOSIS — R52 Pain, unspecified: Secondary | ICD-10-CM | POA: Diagnosis not present

## 2021-09-29 DIAGNOSIS — K219 Gastro-esophageal reflux disease without esophagitis: Secondary | ICD-10-CM | POA: Diagnosis present

## 2021-09-29 DIAGNOSIS — S300XXA Contusion of lower back and pelvis, initial encounter: Secondary | ICD-10-CM | POA: Diagnosis not present

## 2021-09-29 DIAGNOSIS — M6281 Muscle weakness (generalized): Secondary | ICD-10-CM | POA: Diagnosis present

## 2021-09-29 DIAGNOSIS — J90 Pleural effusion, not elsewhere classified: Secondary | ICD-10-CM | POA: Diagnosis not present

## 2021-09-29 DIAGNOSIS — E440 Moderate protein-calorie malnutrition: Secondary | ICD-10-CM | POA: Diagnosis present

## 2021-09-29 DIAGNOSIS — D649 Anemia, unspecified: Secondary | ICD-10-CM | POA: Diagnosis not present

## 2021-09-29 DIAGNOSIS — J438 Other emphysema: Secondary | ICD-10-CM | POA: Diagnosis present

## 2021-09-29 DIAGNOSIS — N183 Chronic kidney disease, stage 3 unspecified: Secondary | ICD-10-CM | POA: Diagnosis present

## 2021-09-29 DIAGNOSIS — J432 Centrilobular emphysema: Secondary | ICD-10-CM | POA: Diagnosis present

## 2021-09-29 DIAGNOSIS — S0990XA Unspecified injury of head, initial encounter: Secondary | ICD-10-CM | POA: Diagnosis not present

## 2021-09-29 DIAGNOSIS — Z79899 Other long term (current) drug therapy: Secondary | ICD-10-CM | POA: Diagnosis not present

## 2021-09-29 DIAGNOSIS — Z961 Presence of intraocular lens: Secondary | ICD-10-CM | POA: Diagnosis present

## 2021-09-29 DIAGNOSIS — Y92009 Unspecified place in unspecified non-institutional (private) residence as the place of occurrence of the external cause: Secondary | ICD-10-CM | POA: Diagnosis not present

## 2021-09-29 DIAGNOSIS — N289 Disorder of kidney and ureter, unspecified: Secondary | ICD-10-CM | POA: Diagnosis not present

## 2021-09-29 DIAGNOSIS — R55 Syncope and collapse: Secondary | ICD-10-CM | POA: Diagnosis present

## 2021-09-29 DIAGNOSIS — M40204 Unspecified kyphosis, thoracic region: Secondary | ICD-10-CM | POA: Diagnosis not present

## 2021-09-29 DIAGNOSIS — M48061 Spinal stenosis, lumbar region without neurogenic claudication: Secondary | ICD-10-CM | POA: Diagnosis not present

## 2021-09-29 DIAGNOSIS — I959 Hypotension, unspecified: Secondary | ICD-10-CM | POA: Diagnosis not present

## 2021-09-29 DIAGNOSIS — Z87891 Personal history of nicotine dependence: Secondary | ICD-10-CM | POA: Diagnosis not present

## 2021-09-29 DIAGNOSIS — S32501A Unspecified fracture of right pubis, initial encounter for closed fracture: Secondary | ICD-10-CM | POA: Diagnosis not present

## 2021-09-29 DIAGNOSIS — R636 Underweight: Secondary | ICD-10-CM | POA: Diagnosis present

## 2021-09-29 DIAGNOSIS — M1990 Unspecified osteoarthritis, unspecified site: Secondary | ICD-10-CM | POA: Diagnosis present

## 2021-09-29 DIAGNOSIS — M25552 Pain in left hip: Secondary | ICD-10-CM | POA: Diagnosis not present

## 2021-09-29 DIAGNOSIS — M4312 Spondylolisthesis, cervical region: Secondary | ICD-10-CM | POA: Diagnosis not present

## 2021-09-29 DIAGNOSIS — Z9841 Cataract extraction status, right eye: Secondary | ICD-10-CM

## 2021-09-29 DIAGNOSIS — R279 Unspecified lack of coordination: Secondary | ICD-10-CM | POA: Diagnosis not present

## 2021-09-29 DIAGNOSIS — Z841 Family history of disorders of kidney and ureter: Secondary | ICD-10-CM

## 2021-09-29 DIAGNOSIS — S8012XA Contusion of left lower leg, initial encounter: Secondary | ICD-10-CM | POA: Diagnosis not present

## 2021-09-29 DIAGNOSIS — I2510 Atherosclerotic heart disease of native coronary artery without angina pectoris: Secondary | ICD-10-CM | POA: Diagnosis not present

## 2021-09-29 DIAGNOSIS — D62 Acute posthemorrhagic anemia: Secondary | ICD-10-CM | POA: Diagnosis not present

## 2021-09-29 DIAGNOSIS — Z85830 Personal history of malignant neoplasm of bone: Secondary | ICD-10-CM | POA: Diagnosis not present

## 2021-09-29 DIAGNOSIS — Z9981 Dependence on supplemental oxygen: Secondary | ICD-10-CM | POA: Diagnosis not present

## 2021-09-29 DIAGNOSIS — M4856XA Collapsed vertebra, not elsewhere classified, lumbar region, initial encounter for fracture: Secondary | ICD-10-CM | POA: Diagnosis not present

## 2021-09-29 DIAGNOSIS — D696 Thrombocytopenia, unspecified: Secondary | ICD-10-CM | POA: Diagnosis not present

## 2021-09-29 DIAGNOSIS — S7002XA Contusion of left hip, initial encounter: Secondary | ICD-10-CM | POA: Diagnosis present

## 2021-09-29 DIAGNOSIS — R0902 Hypoxemia: Secondary | ICD-10-CM | POA: Diagnosis not present

## 2021-09-29 DIAGNOSIS — D469 Myelodysplastic syndrome, unspecified: Secondary | ICD-10-CM | POA: Diagnosis not present

## 2021-09-29 DIAGNOSIS — R531 Weakness: Secondary | ICD-10-CM | POA: Diagnosis not present

## 2021-09-29 DIAGNOSIS — J449 Chronic obstructive pulmonary disease, unspecified: Secondary | ICD-10-CM | POA: Diagnosis not present

## 2021-09-29 DIAGNOSIS — R42 Dizziness and giddiness: Secondary | ICD-10-CM | POA: Diagnosis not present

## 2021-09-29 DIAGNOSIS — S3991XA Unspecified injury of abdomen, initial encounter: Secondary | ICD-10-CM | POA: Diagnosis not present

## 2021-09-29 DIAGNOSIS — K922 Gastrointestinal hemorrhage, unspecified: Secondary | ICD-10-CM | POA: Diagnosis present

## 2021-09-29 DIAGNOSIS — N401 Enlarged prostate with lower urinary tract symptoms: Secondary | ICD-10-CM | POA: Diagnosis present

## 2021-09-29 DIAGNOSIS — S2243XA Multiple fractures of ribs, bilateral, initial encounter for closed fracture: Secondary | ICD-10-CM | POA: Diagnosis not present

## 2021-09-29 LAB — COMPREHENSIVE METABOLIC PANEL
ALT: 13 U/L (ref 0–44)
AST: 22 U/L (ref 15–41)
Albumin: 3 g/dL — ABNORMAL LOW (ref 3.5–5.0)
Alkaline Phosphatase: 63 U/L (ref 38–126)
Anion gap: 5 (ref 5–15)
BUN: 38 mg/dL — ABNORMAL HIGH (ref 8–23)
CO2: 20 mmol/L — ABNORMAL LOW (ref 22–32)
Calcium: 8.6 mg/dL — ABNORMAL LOW (ref 8.9–10.3)
Chloride: 111 mmol/L (ref 98–111)
Creatinine, Ser: 2.33 mg/dL — ABNORMAL HIGH (ref 0.61–1.24)
GFR, Estimated: 26 mL/min — ABNORMAL LOW (ref >60)
Glucose, Bld: 92 mg/dL (ref 70–99)
Potassium: 3.9 mmol/L (ref 3.5–5.1)
Sodium: 136 mmol/L (ref 135–145)
Total Bilirubin: 1.4 mg/dL — ABNORMAL HIGH (ref 0.3–1.2)
Total Protein: 6.6 g/dL (ref 6.5–8.1)

## 2021-09-29 LAB — CBC WITH DIFFERENTIAL/PLATELET
Basophils Absolute: 0 10*3/uL (ref 0.0–0.1)
Basophils Relative: 0 %
Blasts: 2 %
Eosinophils Absolute: 0 10*3/uL (ref 0.0–0.5)
Eosinophils Relative: 0 %
HCT: 21.7 % — ABNORMAL LOW (ref 39.0–52.0)
Hemoglobin: 6.9 g/dL — CL (ref 13.0–17.0)
Lymphocytes Relative: 37 %
Lymphs Abs: 6.6 10*3/uL — ABNORMAL HIGH (ref 0.7–4.0)
MCH: 28.9 pg (ref 26.0–34.0)
MCHC: 31.8 g/dL (ref 30.0–36.0)
MCV: 90.8 fL (ref 80.0–100.0)
Metamyelocytes Relative: 3 %
Monocytes Absolute: 1.4 10*3/uL — ABNORMAL HIGH (ref 0.1–1.0)
Monocytes Relative: 8 %
Myelocytes: 1 %
Neutro Abs: 8.8 10*3/uL — ABNORMAL HIGH (ref 1.7–7.7)
Neutrophils Relative %: 49 %
Platelets: 32 10*3/uL — ABNORMAL LOW (ref 150–400)
RBC: 2.39 MIL/uL — ABNORMAL LOW (ref 4.22–5.81)
RDW: 21.2 % — ABNORMAL HIGH (ref 11.5–15.5)
Smear Review: DECREASED
WBC: 17.9 10*3/uL — ABNORMAL HIGH (ref 4.0–10.5)
nRBC: 1.2 % — ABNORMAL HIGH (ref 0.0–0.2)

## 2021-09-29 LAB — URINALYSIS, ROUTINE W REFLEX MICROSCOPIC
Bilirubin Urine: NEGATIVE
Glucose, UA: NEGATIVE mg/dL
Ketones, ur: NEGATIVE mg/dL
Leukocytes,Ua: NEGATIVE
Nitrite: NEGATIVE
Protein, ur: 30 mg/dL — AB
Specific Gravity, Urine: 1.012 (ref 1.005–1.030)
pH: 5 (ref 5.0–8.0)

## 2021-09-29 LAB — POC OCCULT BLOOD, ED: Fecal Occult Bld: NEGATIVE

## 2021-09-29 LAB — APTT: aPTT: 31 seconds (ref 24–36)

## 2021-09-29 LAB — TROPONIN I (HIGH SENSITIVITY)
Troponin I (High Sensitivity): 9 ng/L (ref <18)
Troponin I (High Sensitivity): 10 ng/L (ref <18)

## 2021-09-29 LAB — PROTIME-INR
INR: 1.3 — ABNORMAL HIGH (ref 0.8–1.2)
Prothrombin Time: 15.9 seconds — ABNORMAL HIGH (ref 11.4–15.2)

## 2021-09-29 LAB — PREPARE RBC (CROSSMATCH)

## 2021-09-29 MED ORDER — ALBUTEROL SULFATE HFA 108 (90 BASE) MCG/ACT IN AERS: 2.0000 | INHALATION_SPRAY | RESPIRATORY_TRACT | Status: DC | PRN

## 2021-09-29 MED ORDER — SODIUM CHLORIDE 0.9% IV SOLUTION: Freq: Once | INTRAVENOUS | Status: AC

## 2021-09-29 MED ORDER — MIDODRINE HCL 5 MG PO TABS
5.0000 mg | ORAL_TABLET | Freq: Once | ORAL | Status: AC
  Administered 2021-09-29: 5 mg via ORAL
  Filled 2021-09-29: qty 1

## 2021-09-29 MED ORDER — MIDODRINE HCL 5 MG PO TABS
5.0000 mg | ORAL_TABLET | Freq: Three times a day (TID) | ORAL | Status: DC
  Administered 2021-09-29 – 2021-10-03 (×12): 5 mg via ORAL
  Filled 2021-09-29 (×12): qty 1

## 2021-09-29 MED ORDER — SODIUM CHLORIDE 0.9 % IV BOLUS
500.0000 mL | Freq: Once | INTRAVENOUS | Status: AC
  Administered 2021-09-29: 500 mL via INTRAVENOUS

## 2021-09-29 MED ORDER — SODIUM CHLORIDE 0.9 % IV SOLN
INTRAVENOUS | Status: DC
Start: 2021-09-29 — End: 2021-10-03

## 2021-09-29 MED ORDER — SODIUM CHLORIDE 0.9 % IV SOLN
10.0000 mL/h | Freq: Once | INTRAVENOUS | Status: DC
Start: 2021-09-29 — End: 2021-09-29

## 2021-09-29 MED ORDER — MIDODRINE HCL 5 MG PO TABS: 5.0000 mg | ORAL_TABLET | Freq: Three times a day (TID) | ORAL | Status: DC

## 2021-09-29 MED ORDER — UMECLIDINIUM-VILANTEROL 62.5-25 MCG/ACT IN AEPB
1.0000 | INHALATION_SPRAY | Freq: Every day | RESPIRATORY_TRACT | Status: DC
  Administered 2021-09-30 – 2021-10-03 (×4): 1 via RESPIRATORY_TRACT
  Filled 2021-09-29: qty 14

## 2021-09-29 MED ORDER — ONDANSETRON HCL 4 MG/2ML IJ SOLN
4.0000 mg | Freq: Four times a day (QID) | INTRAMUSCULAR | Status: DC | PRN
Start: 2021-09-29 — End: 2021-10-04
  Administered 2021-09-30 – 2021-10-02 (×2): 4 mg via INTRAVENOUS
  Filled 2021-09-29 (×2): qty 2

## 2021-09-29 MED ORDER — ACETAMINOPHEN 325 MG PO TABS
650.0000 mg | ORAL_TABLET | Freq: Four times a day (QID) | ORAL | Status: DC | PRN
  Administered 2021-09-30 – 2021-10-01 (×3): 650 mg via ORAL
  Filled 2021-09-29 (×3): qty 2

## 2021-09-29 MED ORDER — ACETAMINOPHEN 650 MG RE SUPP: 650.0000 mg | Freq: Four times a day (QID) | RECTAL | Status: DC | PRN

## 2021-09-29 MED ORDER — PREDNISONE 10 MG PO TABS
5.0000 mg | ORAL_TABLET | Freq: Every day | ORAL | Status: DC
  Administered 2021-09-29 – 2021-10-03 (×5): 5 mg via ORAL
  Filled 2021-09-29 (×5): qty 1

## 2021-09-29 MED ORDER — CHLORHEXIDINE GLUCONATE CLOTH 2 % EX PADS
6.0000 | MEDICATED_PAD | Freq: Every day | CUTANEOUS | Status: DC
  Administered 2021-09-29 – 2021-10-02 (×4): 6 via TOPICAL

## 2021-09-29 MED ORDER — ALBUTEROL SULFATE (2.5 MG/3ML) 0.083% IN NEBU: 2.5000 mg | INHALATION_SOLUTION | RESPIRATORY_TRACT | Status: DC | PRN

## 2021-09-29 MED ORDER — ONDANSETRON HCL 4 MG PO TABS: 4.0000 mg | ORAL_TABLET | Freq: Four times a day (QID) | ORAL | Status: DC | PRN

## 2021-09-29 NOTE — ED Provider Notes (Addendum)
?[facility] ?Provider Note ? ? ?CSN: [id number] ?Arrival date & time: 09/29/21  0935 ? ?  ? ?History ?Chief Complaint  ?Patient presents with  ? Weakness  ? ? ?[name] is a 88 y.o. male with history of hypotension, thrombocytopenia, COPD, chronic kidney disease, mild dysplastic syndrome, anemia requiring blood transfusion presents the emergency department for evaluation of generalized weakness and lightheadedness.  Patient reports he woke up around midnight today and felt lightheaded and that he was going to pass out. He reports that he went back to sleep, but when he woke up again to go to the bathroom, he felt lightheaded and fell backwards landing on his buttocks. He denies any LOC or any head injury. Denies any blood thinner use. He denies any head, back, or neck pain. He reports he did have three episodes of vomiting yesterday without any hematemesis or coffee ground emesis. He denies any chest pain, abdominal pain, fever, nausea, diarrhea, constipation, melena, hematochezia, dysuria, or hematuria. He does report SOB, but reports it is at its typical baseline from his COPD. The patient reports he did not take his midodrine this AM. He was recently discharged from the hospital on 09/27/21 for symptomatic anemia requiring blood transfusions.  ? ?The patient arrived via [facility]. His BP was initially 70/30 and 567m NS was given and brought BP up to 90/60.  ? ?Weakness ?Associated symptoms: arthralgias, shortness of breath and vomiting   ?Associated symptoms: no abdominal pain, no chest pain, no diarrhea, no dysuria, no fever and no nausea   ? ?  ? ?Home Medications ?Prior to Admission medications   ?Medication Sig Start Date End Date Taking? Authorizing Provider  ?albuterol (VENTOLIN HFA) 108 (90 Base) MCG/ACT inhaler Inhale 2 puffs into the lungs every 4 (four) hours as needed for wheezing or shortness of breath. 04/04/20   ERoxan Hockey MD  ?midodrine (PROAMATINE) 5 MG tablet Take 1  tablet (5 mg total) by mouth 3 (three) times daily with meals. 09/21/21   CDessa Phi DO  ?predniSONE (DELTASONE) 5 MG tablet Take 5 mg by mouth daily with breakfast.    [provider]  ?umeclidinium-vilanterol (ANORO ELLIPTA) 62.5-25 MCG/ACT AEPB Inhale 1 puff into the lungs daily. 08/29/21   WTanda Rockers MD  ?   ? ?Allergies    ?Patient has no known allergies.   ? ?Review of Systems   ?Review of Systems  ?Constitutional:  Positive for fatigue. Negative for chills and fever.  ?Respiratory:  Positive for shortness of breath.   ?Cardiovascular:  Negative for chest pain and palpitations.  ?Gastrointestinal:  Positive for vomiting. Negative for abdominal pain, anal bleeding, blood in stool, constipation, diarrhea and nausea.  ?Genitourinary:  Negative for dysuria and hematuria.  ?Musculoskeletal:  Positive for arthralgias.  ?Neurological:  Positive for weakness and light-headedness. Negative for numbness.  ? ?Physical Exam ?Updated Vital Signs ?BP (!) 81/49   Pulse 80   Temp 97.7 ?F (36.5 ?C) (Oral)   Resp 16   Ht '6\' 4"'$  (1.93 m)   Wt 55.9 kg   SpO2 100%   BMI 15.01 kg/m?  ?Physical Exam ?Vitals and nursing note reviewed. Exam conducted with a chaperone present ([place] Cerise RTherapist, sports.  ?Constitutional:   ?   Comments: Pale appearing and cachetic  ?HENT:  ?   Head: Normocephalic and atraumatic.  ?   Mouth/Throat:  ?   Mouth: Mucous membranes are moist.  ?Cardiovascular:  ?   Rate and Rhythm: Normal rate and regular  rhythm.  ?Pulmonary:  ?   Effort: Pulmonary effort is normal. No respiratory distress.  ?   Comments: On his at home O2 ?Abdominal:  ?   General: Abdomen is flat. Bowel sounds are normal.  ?   Palpations: Abdomen is soft.  ?   Tenderness: There is no abdominal tenderness. There is no guarding or rebound.  ?Genitourinary: ?   Rectum: Normal.  ?Musculoskeletal:     ?   General: Signs of injury present.  ?   Cervical back: Normal range of motion. No tenderness.  ?   Comments: Large bruising noted to  the patient's LEFT buttock/lateral hip. Tenderness to palpation. No bony step offs or abnormalities visualized or palpated. Compartments are soft.  ? ?No midline or paraspinal cervical, thoracic, lumbar tenderness palpation.  No overlying skin changes noted.  No bony step-offs or deformities noted.  No increased erythema or warmth. ? ?No femur or lower leg tenderness to palpation bilaterally. No tenderness or bony step offs to the upper arms. Old bruising noted diffusely.   ?Neurological:  ?   General: No focal deficit present.  ?   Mental Status: He is alert and oriented to person, place, and time.  ?   Motor: Weakness present.  ?   Comments: Generalized weakness, nothing focal  ? ? ?ED Results / Procedures / Treatments   ?Labs ?(all labs ordered are listed, but only abnormal results are displayed) ?Labs Reviewed  ?CBC WITH DIFFERENTIAL/PLATELET  ?COMPREHENSIVE METABOLIC PANEL  ?URINALYSIS, ROUTINE W REFLEX MICROSCOPIC  ?APTT  ?PROTIME-INR  ?TYPE AND SCREEN  ?TROPONIN I (HIGH SENSITIVITY)  ? ? ?EKG ?None ? ?Radiology ?No results found. ? ?Procedures ?Procedures  ? ?Medications Ordered in ED ?Medications  ?midodrine (PROAMATINE) tablet 5 mg (has no administration in time range)  ? ? ?ED Course/ Medical Decision Making/ A&P ?  ?                        ?Medical Decision Making ?Amount and/or Complexity of Data Reviewed ?Labs: ordered. ?Radiology: ordered. ? ?Risk ?Prescription drug management. ?Decision regarding hospitalization. ? ? ?88 y/o M presents to the ED for evaluation of near syncope requiring and a fall with left hip pain.  Differential diagnosis includes is not limited to hypotension, anemia, GI bleed, cardiogenic syncope, vasovagal syncope, fracture, stroke.  Vital signs show hypotension at 90/49, afebrile, normal pulse rate, satting well on his at home oxygen.  Physical exam is pertinent for pale and cachetic appearing male. Large, soft bruising noted to the patient's LEFT buttock/lateral hip. Tenderness  to palpation. No bony step offs or abnormalities visualized or palpated.  Compartments are soft. No midline or paraspinal cervical, thoracic, lumbar tenderness palpation.  No overlying skin changes noted.  No bony step-offs or deformities noted.  No increased erythema or warmth. No femur or lower leg tenderness to palpation bilaterally. No tenderness or bony step offs to the upper arms. Old bruising noted diffusely to arms.   ? ?He was ordered another 500 mL of normal saline bolus as well as his at home midodrine that he did not take this morning.   ? ?On previous chart review, the patient was recently discharged on 09-27-2021 for symptomatic anemia with a low hemoglobin in the 6s initially.  ? ?Given the patient's mild dysplastic syndrome as well as anemia, will order imaging to rule out any occult fracture and blood work to check his blood counts. ? ?I intimately reviewed and interpreted the  patient's labs.  CBC shows significant hemoglobin at 6.9.  Last CBC was 2 days ago at 8.2.  He has an elevated white blood cell count at 17.9 although appears to be chronic for the patient.  Stable thrombocytopenia with a platelet count of 32.  Urinalysis is small amount of hemoglobin with 30 protein and rare bacteria although 0-5 white blood cells seen and negative nitrites and leukocytes.  CMP shows mild decrease in his bicarb at 20.  He does have an elevated BUN and creatinine at 38 and 2.33 respectively.  Mild hypocalcemia at 8.6.  Mild hypoalbuminemia of 3.0.  Consistent elevated total bili at 1.4.  Estimated GFR 26.  Troponin at 10.  PT/INR shows mild elevated PT at 15.9 and mildly elevated INR 1.3.  Normal APTT.  Type and screen ran. POC Hemeoccult negative.  ? ?Because of his elevated creatinine and his GFR being below 30, opted for imaging without contrast. ? ?Plain film of his pelvis shows left proximal femur appears to be intact although there is sufficient for potential fractures in the right contralateral inferior  pubic ramus and also the possible superior pelvic rami as well.  Recommended further imaging on CT.  CT of the cervical spine shows mild calcified plaque at the common carotid bifurcations however there is no

## 2021-09-29 NOTE — Progress Notes (Signed)
Date and time results received: 09/29/21 1845 ?(use smartphrase ".now" to insert current time) ? ?Test: Platelets ?Critical Value: 27 ? ?Name of Provider Notified: Roderic Palau, MD. ? ?Orders Received? Or Actions Taken?: Orders Received - See Orders for details ?

## 2021-09-29 NOTE — ED Triage Notes (Signed)
Pt brought in by [facility] from home with c/o weakness and dizziness. Fall this morning from dizziness landing on his buttocks and falling back. Denies blood thinner use. BP 70/30 for EMS, 557m NS fluids given by EMS and BP up to 90/60. Pt has had 2 blood transfusions in the past week. O2 sat 90% on home 3L O2.  ?

## 2021-09-29 NOTE — H&P (Signed)
?History and Physical  ? ? ?[name] MWN:[id number] DOB: 03-12-1930 DOA: 09/29/2021 ? ?PCP: Sharilyn Sites, MD  ?Patient coming from: Home ? ?I have personally briefly reviewed patient's old medical records in [place] ? ?Chief Complaint: Lightheadedness, fatigue ? ?HPI: [name] is a 88 y.o. male with medical history significant of MDS, anemia/thrombocytopenia, chronic kidney disease stage IIIb, steroid-dependent COPD, chronic hypotension on midodrine, presents to the emergency room with fatigue after a fall.  Patient was recently admitted to the hospital from 5/11 to 5/13, after he suffered a fall and developed a significant gluteal hematoma resulting in blood loss anemia.  He was transfused PRBC.  Follow-up hemoglobin was improved and remained stable over 24 hours.  Patient was ambulated and did not have any orthostatic symptoms, he felt well and decision made to send him home.  He was seen by physical therapy at that time and ambulated without difficulty.  He was set up with home health PT.  He reports that he was doing well upon returning home.  Unfortunately, last night he did have a few episodes of vomiting which she describes as clear material.  This did not contain any blood.  He did not have any fever.  His bowel movements have been normal to that point.  Did not have any abdominal pain.  He took some Pepto-Bismol which resolved his vomiting.  He subsequently went to bed, and got up in the middle the night to urinate.  While walking to the bathroom, he reported that his knee gave out causing him to fall on his left side again.  Denies any dizziness or lightheadedness prior to fall.  Reports falling on carpet.  He subsequently stood up, use the bathroom and went back to bed.  Upon waking up in the morning, he felt increasingly fatigued and lightheaded.  He did not feel that he was doing well and called EMS.  Upon their arrival, he was noted to have low blood pressure and received a  bolus of IV fluids.Marland Kitchen  Upon recheck of labs in ED, hemoglobin had dropped down from 8.2 on last discharge to 6.9 on ER evaluation.  He was transfused 1 unit PRBC.  He was also noted to be hypotensive, which responded to volume repletion.  CT imaging showed new/enlarging left gluteal hematoma.  No reported intra-abdominal/retroperitoneal bleeding.  No reported bony injuries. ? ? ? ?Review of Systems: As per HPI otherwise 10 point review of systems negative.  ? ? ?Past Medical History:  ?Diagnosis Date  ? Bone cancer ([place])   ? Chronic kidney insufficiency   ? COPD (chronic obstructive pulmonary disease) ([place])   ? GERD (gastroesophageal reflux disease)   ? Pulmonary nodule   ? ? ?Past Surgical History:  ?Procedure Laterality Date  ? BACK SURGERY    ? BIOPSY  07/15/2021  ? Procedure: BIOPSY;  Surgeon: Harvel Quale, MD;  [facility];  Service: Gastroenterology;;  ? CATARACT EXTRACTION W/PHACO Right 03/09/2016  ? Procedure: CATARACT EXTRACTION PHACO AND INTRAOCULAR LENS PLACEMENT RIGHT EYE CDE=8.58;  Surgeon: Tonny Branch, MD;  Location: [facility];  Service: Ophthalmology;  Laterality: Right;  right  ? CATARACT EXTRACTION W/PHACO Left 04/13/2016  ? Procedure: CATARACT EXTRACTION PHACO AND INTRAOCULAR LENS PLACEMENT (IOC);  Surgeon: Tonny Branch, MD;  Location: [facility];  Service: Ophthalmology;  Laterality: Left;  CDE: 8.08  ? ENTEROSCOPY  07/23/2021  ? Procedure: ENTEROSCOPY;  Surgeon: Harvel Quale, MD;  [facility];  Service:  Gastroenterology;;  ? ESOPHAGOGASTRODUODENOSCOPY (EGD) WITH PROPOFOL N/A 07/14/2021  ? Procedure: ESOPHAGOGASTRODUODENOSCOPY (EGD) WITH PROPOFOL;  Surgeon: Eloise Harman, DO;  [facility];  Service: Endoscopy;  Laterality: N/A;  ? ESOPHAGOGASTRODUODENOSCOPY (EGD) WITH PROPOFOL N/A 07/15/2021  ? Procedure: ESOPHAGOGASTRODUODENOSCOPY (EGD) WITH PROPOFOL;  Surgeon: Harvel Quale, MD;  [facility];  Service: Gastroenterology;   Laterality: N/A;  ? ESOPHAGOGASTRODUODENOSCOPY (EGD) WITH PROPOFOL N/A 07/23/2021  ? Procedure: ESOPHAGOGASTRODUODENOSCOPY (EGD) WITH PROPOFOL;  Surgeon: Harvel Quale, MD;  [facility];  Service: Gastroenterology;  Laterality: N/A;  ? HEMOSTASIS CLIP PLACEMENT  07/15/2021  ? Procedure: HEMOSTASIS CLIP PLACEMENT;  Surgeon: Harvel Quale, MD;  [facility];  Service: Gastroenterology;;  ? HEMOSTASIS CLIP PLACEMENT  07/23/2021  ? Procedure: HEMOSTASIS CLIP PLACEMENT;  Surgeon: Harvel Quale, MD;  [facility];  Service: Gastroenterology;;  ? KYPHOPLASTY Bilateral 06/09/2019  ? Procedure: T11 KYPHOPLASTY;  Surgeon: Consuella Lose, MD;  Location: [facility];  Service: Neurosurgery;  Laterality: Bilateral;  ? None to Date  10/01/15  ? PORT-A-CATH REMOVAL Left 08/07/2021  ? Procedure: MINOR REMOVAL PORT-A-CATH;  Surgeon: Virl Cagey, MD;  Location: [facility];  Service: General;  Laterality: Left;  lidocaine 1%  ? PORTACATH PLACEMENT Left 10/11/2020  ? Procedure: INSERTION PORT-A-CATH (attached catheter in left internal jugular);  Surgeon: Virl Cagey, MD;  Location: [facility];  Service: General;  Laterality: Left;  ? ? ?Social History: ? reports that he quit smoking about 31 years ago. His smoking use included cigarettes and cigars. He has never used smokeless tobacco. He reports that he does not currently use alcohol. He reports that he does not use drugs. ? ?No Known Allergies ? ?Family History  ?Problem Relation Age of Onset  ? Kidney disease Mother   ? Kidney disease Sister   ? Colon cancer Neg Hx   ? Gastric cancer Neg Hx   ? Esophageal cancer Neg Hx   ? ? ? ?Prior to Admission medications   ?Medication Sig Start Date End Date Taking? Authorizing Provider  ?albuterol (VENTOLIN HFA) 108 (90 Base) MCG/ACT inhaler Inhale 2 puffs into the lungs every 4 (four) hours as needed for wheezing or shortness of breath. 04/04/20  Yes Roxan Hockey, MD   ?midodrine (PROAMATINE) 5 MG tablet Take 1 tablet (5 mg total) by mouth 3 (three) times daily with meals. 09/21/21  Yes Dessa Phi, DO  ?predniSONE (DELTASONE) 5 MG tablet Take 5 mg by mouth daily with breakfast.   Yes [provider]  ?silodosin (RAPAFLO) 4 MG CAPS capsule Take 4 mg by mouth at bedtime.   Yes [provider]  ?umeclidinium-vilanterol (ANORO ELLIPTA) 62.5-25 MCG/ACT AEPB Inhale 1 puff into the lungs daily. ?Patient not taking: Reported on 09/29/2021 08/29/21   Tanda Rockers, MD  ? ? ?Physical Exam: ?Vitals:  ? 09/29/21 1800 09/29/21 1852 09/29/21 1900 09/29/21 1907  ?BP: (!) 96/43 (!) 96/43 (!) 102/45 (!) 94/44  ?Pulse: 89 81 81 83  ?Resp: 18 (!) '21 20 19  '$ ?Temp:  98.4 ?F (36.9 ?C)  97.9 ?F (36.6 ?C)  ?TempSrc:  Oral  Oral  ?SpO2: 100% 100% 100% 100%  ?Weight:      ?Height:      ? ? ?Constitutional: NAD, calm, comfortable ?Eyes: PERRL, lids and conjunctivae normal ?ENMT: Mucous membranes are moist. Posterior pharynx clear of any exudate or lesions.Normal dentition.  ?Neck: normal, supple, no masses, no thyromegaly ?Respiratory: clear to auscultation bilaterally,  no wheezing, no crackles. Normal respiratory effort. No accessory muscle use.  ?Cardiovascular: Regular rate and rhythm, no murmurs / rubs / gallops. No extremity edema. 2+ pedal pulses. No carotid bruits.  ?Abdomen: no tenderness, no masses palpated. No hepatosplenomegaly. Bowel sounds positive.  ?Musculoskeletal: no clubbing / cyanosis. No joint deformity upper and lower extremities. Good ROM, no contractures. Normal muscle tone.  ?Skin: Large area of bruising over left gluteal region as pictured below ?Neurologic: CN 2-12 grossly intact. Sensation intact, DTR normal. Strength 5/5 in all 4.  ?Psychiatric: Normal judgment and insight. Alert and oriented x 3. Normal mood.  ? ? ? ?Labs on Admission: I have personally reviewed following labs and imaging studies ? ?CBC: ?Recent Labs  ?Lab 09/25/21 ?1214 09/26/21 ?0240  09/27/21 ?0502 09/29/21 ?9735 09/29/21 ?1722  ?WBC 25.6* 17.7* 16.4* 17.9* 18.6*  ?NEUTROABS 13.6*  --   --  8.8* 9.4*  ?HGB 6.2* 8.2* 8.2* 6.9* 7.3*  ?HCT 19.8* 24.7* 25.2* 21.7* 22.4*  ?MCV 88.0 89.5 90.3 90.8 90.3

## 2021-09-30 ENCOUNTER — Inpatient Hospital Stay (HOSPITAL_COMMUNITY): Payer: Medicare Other

## 2021-09-30 ENCOUNTER — Encounter: Payer: Medicare Other | Admitting: General Surgery

## 2021-09-30 DIAGNOSIS — N1832 Chronic kidney disease, stage 3b: Secondary | ICD-10-CM | POA: Diagnosis not present

## 2021-09-30 DIAGNOSIS — J439 Emphysema, unspecified: Secondary | ICD-10-CM | POA: Diagnosis not present

## 2021-09-30 DIAGNOSIS — I9589 Other hypotension: Secondary | ICD-10-CM | POA: Diagnosis not present

## 2021-09-30 DIAGNOSIS — D62 Acute posthemorrhagic anemia: Secondary | ICD-10-CM | POA: Diagnosis not present

## 2021-09-30 LAB — COMPREHENSIVE METABOLIC PANEL
ALT: 13 U/L (ref 0–44)
AST: 18 U/L (ref 15–41)
Albumin: 2.6 g/dL — ABNORMAL LOW (ref 3.5–5.0)
Alkaline Phosphatase: 52 U/L (ref 38–126)
Anion gap: 3 — ABNORMAL LOW (ref 5–15)
BUN: 36 mg/dL — ABNORMAL HIGH (ref 8–23)
CO2: 20 mmol/L — ABNORMAL LOW (ref 22–32)
Calcium: 7.8 mg/dL — ABNORMAL LOW (ref 8.9–10.3)
Chloride: 114 mmol/L — ABNORMAL HIGH (ref 98–111)
Creatinine, Ser: 2.06 mg/dL — ABNORMAL HIGH (ref 0.61–1.24)
GFR, Estimated: 30 mL/min — ABNORMAL LOW (ref >60)
Glucose, Bld: 103 mg/dL — ABNORMAL HIGH (ref 70–99)
Potassium: 3.7 mmol/L (ref 3.5–5.1)
Sodium: 137 mmol/L (ref 135–145)
Total Bilirubin: 0.8 mg/dL (ref 0.3–1.2)
Total Protein: 5.4 g/dL — ABNORMAL LOW (ref 6.5–8.1)

## 2021-09-30 LAB — CBC
HCT: 17.8 % — ABNORMAL LOW (ref 39.0–52.0)
HCT: 32.5 % — ABNORMAL LOW (ref 39.0–52.0)
Hemoglobin: 5.6 g/dL — CL (ref 13.0–17.0)
Hemoglobin: 10.5 g/dL — ABNORMAL LOW (ref 13.0–17.0)
MCH: 28.4 pg (ref 26.0–34.0)
MCH: 27.4 pg (ref 26.0–34.0)
MCHC: 31.5 g/dL (ref 30.0–36.0)
MCHC: 32.3 g/dL (ref 30.0–36.0)
MCV: 90.4 fL (ref 80.0–100.0)
MCV: 84.9 fL (ref 80.0–100.0)
Platelets: 60 10*3/uL — ABNORMAL LOW (ref 150–400)
Platelets: 51 10*3/uL — ABNORMAL LOW (ref 150–400)
RBC: 1.97 MIL/uL — ABNORMAL LOW (ref 4.22–5.81)
RBC: 3.83 MIL/uL — ABNORMAL LOW (ref 4.22–5.81)
RDW: 19.4 % — ABNORMAL HIGH (ref 11.5–15.5)
RDW: 19.3 % — ABNORMAL HIGH (ref 11.5–15.5)
WBC: 16.2 10*3/uL — ABNORMAL HIGH (ref 4.0–10.5)
WBC: 17.6 10*3/uL — ABNORMAL HIGH (ref 4.0–10.5)
nRBC: 1 % — ABNORMAL HIGH (ref 0.0–0.2)
nRBC: 1.4 % — ABNORMAL HIGH (ref 0.0–0.2)

## 2021-09-30 LAB — PROTIME-INR
INR: 1.3 — ABNORMAL HIGH (ref 0.8–1.2)
Prothrombin Time: 15.9 seconds — ABNORMAL HIGH (ref 11.4–15.2)

## 2021-09-30 LAB — CULTURE, BLOOD (ROUTINE X 2)
Culture: NO GROWTH
Culture: NO GROWTH
Special Requests: ADEQUATE

## 2021-09-30 MED ORDER — SODIUM CHLORIDE 0.9% IV SOLUTION: Freq: Once | INTRAVENOUS | Status: AC

## 2021-09-30 MED ORDER — TRAMADOL HCL 50 MG PO TABS
50.0000 mg | ORAL_TABLET | Freq: Three times a day (TID) | ORAL | Status: DC | PRN
  Administered 2021-09-30 – 2021-10-02 (×5): 50 mg via ORAL
  Filled 2021-09-30 (×5): qty 1

## 2021-09-30 NOTE — Progress Notes (Signed)
?PROGRESS NOTE ? ? ? ?[name]  KGY:[id number] DOB: 1930/05/18 DOA: 09/29/2021 ?PCP: Sharilyn Sites, MD  ? ? ?Brief Narrative:  ?[name] is a 88 y.o. male with medical history significant of MDS, anemia/thrombocytopenia, chronic kidney disease stage IIIb, steroid-dependent COPD, chronic hypotension on midodrine, presents to the emergency room with fatigue after a fall.  Patient was recently admitted to the hospital from 5/11 to 5/13, after he suffered a fall and developed a significant gluteal hematoma resulting in blood loss anemia.  He was transfused PRBC.  Follow-up hemoglobin was improved and remained stable over 24 hours.  Patient was ambulated and did not have any orthostatic symptoms, he felt well and decision made to send him home.  He was seen by physical therapy at that time and ambulated without difficulty.  He was set up with home health PT.  He reports that he was doing well upon returning home.  Unfortunately, last night he did have a few episodes of vomiting which she describes as clear material.  This did not contain any blood.  He did not have any fever.  His bowel movements have been normal to that point.  Did not have any abdominal pain.  He took some Pepto-Bismol which resolved his vomiting.  He subsequently went to bed, and got up in the middle the night to urinate.  While walking to the bathroom, he reported that his knee gave out causing him to fall on his left side again.  Denies any dizziness or lightheadedness prior to fall.  Reports falling on carpet.  He subsequently stood up, use the bathroom and went back to bed.  Upon waking up in the morning, he felt increasingly fatigued and lightheaded.  He did not feel that he was doing well and called EMS.  Upon their arrival, he was noted to have low blood pressure and received a bolus of IV fluids.Marland Kitchen  Upon recheck of labs in ED, hemoglobin had dropped down from 8.2 on last discharge to 6.9 on ER evaluation.  He was transfused 1 unit  PRBC.  He was also noted to be hypotensive, which responded to volume repletion.  CT imaging showed new/enlarging left gluteal hematoma.  No reported intra-abdominal/retroperitoneal bleeding.  No reported bony injuries. ? ? ?Assessment & Plan: ?  ?Principal Problem: ?  Acute blood loss anemia ?Active Problems: ?  Myelodysplastic syndrome ([place]) ?  Thrombocytopenia ([place]) ?  CKD (chronic kidney disease), stage IIIB ?  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 ?  Body mass index (BMI) 19.9 or less, adult ?  Chronic hypotension ?  Fall at home, initial encounter ?  Traumatic hematoma of left hip ? ? ?Acute on chronic blood loss anemia. ?-No evidence of GI bleeding at this time, FOBT negative ?-He has had worsening bleeding related to fall in left gluteal region. ?-He is being transfused PRBC, goal is to keep hemoglobin greater than 8 ?-Hemoglobin on admission noted to be 6.9 ?-Thus far, is received a total of 4 units of PRBC ?-Most recent hemoglobin after transfusion noted to be 10.5 ?  ?Thrombocytopenia ?-Secondary to MDS ?-Platelet count on admission noted to be 32K ?-He received 1 unit of platelets due to enlarging hematoma ?-Follow-up platelet count has improved ?-Goal is to keep platelets greater than 50,000 ?  ?Steroid dependent COPD ?-Continue on home dose of prednisone ?-Does not have wheezing at this time ?-Continue bronchodilators ?  ?CKD stage IIIb ?-Creatinine is near prior values from  last admission ?-Continue to monitor as he has been volume repleted ?  ?Chronic hypotension ?-Continue on midodrine ?-We will apply TED hose ?  ?MDS ?-Continue follow-up with oncology ?  ?Fall with left gluteal hematoma ?-Second fall in a week resulting in significant injury ?-Does not recall dizziness, lightheadedness syncope prior to fall.  Simply states that his knee gives out. ?-We will request physical therapy to reevaluate ?-Continue pain  management ?-Continue to ice left hip/gluteal region ? ? ?DVT prophylaxis: Place TED hose Start: 09/29/21 1728 ?SCDs Start: 09/29/21 1727 ? ?Code Status: DNR ?Family Communication: Discussed with patient ?Disposition Plan: Status is: Inpatient ?Remains inpatient appropriate because: Continued monitoring of hemoglobin and hemodynamic stability as well as further bleeding/enlargement hematoma ? ? ? ? ?Consultants:  ? ? ?Procedures:  ? ? ?Antimicrobials:  ?  ? ? ?Subjective: ?He says he feels well today.  Has some discomfort in his left hip/gluteal region ? ?Objective: ?Vitals:  ? 09/30/21 1429 09/30/21 1604 09/30/21 1630 09/30/21 1700  ?BP: (!) 105/51  (!) 122/59 110/63  ?Pulse: 80 71 77 80  ?Resp: '17 18 16 19  '$ ?Temp: 98.3 ?F (36.8 ?C) (!) 97.5 ?F (36.4 ?C) 98.3 ?F (36.8 ?C)   ?TempSrc: Oral Oral Oral   ?SpO2: 100% 100% 100% 100%  ?Weight:      ?Height:      ? ? ?Intake/Output Summary (Last 24 hours) at 09/30/2021 1941 ?Last data filed at 09/30/2021 1757 ?Gross per 24 hour  ?Intake 2371.78 ml  ?Output 1175 ml  ?Net 1196.78 ml  ? ?Filed Weights  ? 09/29/21 0943 09/29/21 1429 09/30/21 0423  ?Weight: 55.9 kg 56.7 kg 60.1 kg  ? ? ?Examination: ? ?General exam: Appears calm and comfortable  ?Respiratory system: Clear to auscultation. Respiratory effort normal. ?Cardiovascular system: S1 & S2 heard, RRR. No JVD, murmurs, rubs, gallops or clicks. No pedal edema. ?Gastrointestinal system: Abdomen is nondistended, soft and nontender. No organomegaly or masses felt. Normal bowel sounds heard. ?Central nervous system: Alert and oriented. No focal neurological deficits. ?Extremities: Symmetric 5 x 5 power. ?Skin: Large area of bruising over left hip/gluteal region ?Psychiatry: Judgement and insight appear normal. Mood & affect appropriate.  ? ? ? ?Data Reviewed: I have personally reviewed following labs and imaging studies ? ?CBC: ?Recent Labs  ?Lab 09/25/21 ?1214 09/26/21 ?9562 09/27/21 ?0502 09/29/21 ?1308 09/29/21 ?1722  09/30/21 ?0450 09/30/21 ?1744  ?WBC 25.6*   < > 16.4* 17.9* 18.6* 16.2* 17.6*  ?NEUTROABS 13.6*  --   --  8.8* 9.4*  --   --   ?HGB 6.2*   < > 8.2* 6.9* 7.3* 5.6* 10.5*  ?HCT 19.8*   < > 25.2* 21.7* 22.4* 17.8* 32.5*  ?MCV 88.0   < > 90.3 90.8 90.3 90.4 84.9  ?PLT 41*   < > 31* 32* 27* 60* 51*  ? < > = values in this interval not displayed.  ? ?Basic Metabolic Panel: ?Recent Labs  ?Lab 09/25/21 ?1214 09/26/21 ?6578 09/29/21 ?4696 09/30/21 ?0450  ?NA 137 137 136 137  ?K 3.6 3.3* 3.9 3.7  ?CL 110 114* 111 114*  ?CO2 20* 19* 20* 20*  ?GLUCOSE 96 90 92 103*  ?BUN 38* 35* 38* 36*  ?CREATININE 2.46* 2.14* 2.33* 2.06*  ?CALCIUM 8.8* 8.3* 8.6* 7.8*  ?MG  --  1.9  --   --   ? ?GFR: ?Estimated Creatinine Clearance: 20.3 mL/min (A) (by C-G formula based on SCr of 2.06 mg/dL (H)). ?Liver Function Tests: ?Recent Labs  ?  Lab 09/25/21 ?1214 09/26/21 ?4098 09/29/21 ?1191 09/30/21 ?0450  ?AST '26 21 22 18  '$ ?ALT '15 13 13 13  '$ ?ALKPHOS 68 60 63 52  ?BILITOT 1.3* 1.3* 1.4* 0.8  ?PROT 6.9 6.0* 6.6 5.4*  ?ALBUMIN 3.3* 2.8* 3.0* 2.6*  ? ?No results for input(s): LIPASE, AMYLASE in the last 168 hours. ?No results for input(s): AMMONIA in the last 168 hours. ?Coagulation Profile: ?Recent Labs  ?Lab 09/29/21 ?0959 09/30/21 ?0450  ?INR 1.3* 1.3*  ? ?Cardiac Enzymes: ?No results for input(s): CKTOTAL, CKMB, CKMBINDEX, TROPONINI in the last 168 hours. ?BNP (last 3 results) ?No results for input(s): PROBNP in the last 8760 hours. ?HbA1C: ?No results for input(s): HGBA1C in the last 72 hours. ?CBG: ?No results for input(s): GLUCAP in the last 168 hours. ?Lipid Profile: ?No results for input(s): CHOL, HDL, LDLCALC, TRIG, CHOLHDL, LDLDIRECT in the last 72 hours. ?Thyroid Function Tests: ?No results for input(s): TSH, T4TOTAL, FREET4, T3FREE, THYROIDAB in the last 72 hours. ?Anemia Panel: ?No results for input(s): VITAMINB12, FOLATE, FERRITIN, TIBC, IRON, RETICCTPCT in the last 72 hours. ?Sepsis Labs: ?Recent Labs  ?Lab 09/25/21 ?1214 09/25/21 ?1410   ?LATICACIDVEN 2.0* 2.0*  ? ? ?Recent Results (from the past 240 hour(s))  ?Culture, blood (Routine X 2) w Reflex to ID Panel     Status: None  ? Collection Time: 09/25/21 12:14 PM  ? Specimen: BLOOD LEFT ARM  ?Result Value R

## 2021-09-30 NOTE — Progress Notes (Signed)
Pt iv leaking through at iv site. Had to stop blood at 1115. Tried to redress site but was clotted off. Iv removed with cath intact. Iv attempted x 1 unsuccessful. 2nd attempt by RN successful. Blood restarted at 1138 . ?

## 2021-09-30 NOTE — TOC Initial Note (Signed)
Transition of Care (TOC) - Initial/Assessment Note  ? ? ?Patient Details  ?Name: [name] ?MRN: [id number] ?Date of Birth: 09/16/1929 ? ?Transition of Care (TOC) CM/SW Contact:    ?Boneta Lucks, RN ?Phone Number: ?09/30/2021, 3:01 PM ? ?Clinical Narrative:         Patient readmitted with acute blood loss anemia. Assessed on 5/12. Home health was set up with [facility]. They did not have time to make the first visit. TOC will follow for changes in discharge plan and orders.         ? ? ?Expected Discharge Plan: [place] ?Barriers to Discharge: Continued Medical Work up ? ? ?Patient Goals and CMS Choice ?Patient states their goals for this hospitalization and ongoing recovery are:: to get better. ?CMS Medicare.gov Compare Post Acute Care list provided to:: Patient Represenative (must comment) ?Choice offered to / list presented to : Adult Children ? ?Expected Discharge Plan and Services ?Expected Discharge Plan: [place] ?  ?    ? ?Prior Living Arrangements/Services ?   ?  ?Activities of Daily Living ?Home Assistive Devices/Equipment: Cane (specify quad or straight), Oxygen ?ADL Screening (condition at time of admission) ?Patient's cognitive ability adequate to safely complete daily activities?: Yes ?Is the patient deaf or have difficulty hearing?: No ?Does the patient have difficulty seeing, even when wearing glasses/contacts?: No ?Does the patient have difficulty concentrating, remembering, or making decisions?: No ?Patient able to express need for assistance with ADLs?: Yes ?Does the patient have difficulty dressing or bathing?: No ?Independently performs ADLs?: Yes (appropriate for developmental age) ?Does the patient have difficulty walking or climbing stairs?: No ?Weakness of Legs: Both ?Weakness of Arms/Hands: None ? ?Permission Sought/Granted ?  ?    ? ?Emotional Assessment ?  ?  ?Admission diagnosis:  Acute blood loss anemia [D62] ?Patient Active  Problem List  ? Diagnosis Date Noted  ? Acute blood loss anemia 09/29/2021  ? Traumatic hematoma of left hip 09/29/2021  ? Pressure injury of skin 09/25/2021  ? Fall at home, initial encounter 09/25/2021  ? Symptomatic anemia 09/20/2021  ? Chronic hypotension 09/20/2021  ? Small bowel bleed requiring more than 4 units of blood in 24 hours, ICU, or surgery 09/15/2021  ? Pulmonary infiltrates on CXR 08/30/2021  ? Hemorrhage from open wound of left chest wall 08/12/2021  ? Severe sepsis ([place]) 08/09/2021  ? AKI (acute kidney injury) ([place]) 08/09/2021  ? Infected venous access port, initial encounter 08/07/2021  ? Acute on chronic respiratory failure with hypoxia ([place]) 07/22/2021  ? Hypokalemia 07/22/2021  ? Melena   ? Goals of care, counseling/discussion 07/15/2021  ? Protein-calorie malnutrition, moderate ([place]) 07/14/2021  ? Heme positive stool 07/13/2021  ? Acute on chronic anemia 07/12/2021  ? Near syncope 06/17/2021  ? Neoplastic malignant related fatigue 01/21/2021  ? Depression 12/02/2020  ? 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  ? Hematuria 12/02/2020  ? Osteoporosis 12/02/2020  ? Low back pain 12/02/2020  ? Syncope 11/11/2020  ? COVID-19 virus infection 11/11/2020  ? COPD Group B  clinically, mild/mod severity  04/03/2020  ? Acute on chronic blood loss anemia 04/03/2020  ? 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 04/03/2020  ? Urinary frequency 02/26/2020  ? Benign prostatic hyperplasia with urinary obstruction 02/26/2020  ? Myelodysplastic syndrome ([place]) 06/16/2019  ? HAP (hospital-acquired pneumonia) 06/16/2019  ? CKD (chronic kidney disease), stage IIIB 06/16/2019  ? Sepsis ([place]) 06/16/2019  ? Thrombocytopenia ([place]) 06/06/2019  ?  Intractable back pain 06/06/2019  ? Chronic kidney insufficiency   ? Body mass index (BMI) 19.9 or less, adult 04/20/2019  ? Compression fracture of lumbar vertebra ([place]) 04/20/2019  ? Elevated blood-pressure reading,  without diagnosis of hypertension 04/20/2019  ? Acute low back pain 03/23/2019  ? Pulmonary nodule 05/19/2018  ? Bronchiectasis ([place]) 05/19/2018  ? Anemia 09/17/2017  ? Abnormal CT of the abdomen 11/02/2016  ? Loss of weight 11/02/2016  ? History of colonic polyps 11/02/2016  ? GERD (gastroesophageal reflux disease) 10/01/2015  ? ?PCP:  Sharilyn Sites, MD ?Pharmacy:   ?[facility] [place] ?[address] [place] [address] ?Phone: [phone] Fax: [phone] ? ? ?Readmission Risk Interventions ? ?  09/30/2021  ?  2:59 PM 09/26/2021  ?  1:35 PM 08/11/2021  ? 10:26 AM  ?Readmission Risk Prevention Plan  ?Transportation Screening  Complete Complete  ?Medication Review Press photographer) Complete Complete Complete  ?PCP or Specialist appointment within 3-5 days of discharge Not Complete Not Complete   ?[place] or Home Care Consult Complete Complete Complete  ?SW Recovery Care/Counseling Consult   Complete  ?Palliative Care Screening   Not Applicable  ?[place]   Not Applicable  ? ? ? ?

## 2021-10-01 ENCOUNTER — Encounter: Payer: Medicare Other | Admitting: General Surgery

## 2021-10-01 DIAGNOSIS — D62 Acute posthemorrhagic anemia: Secondary | ICD-10-CM | POA: Diagnosis not present

## 2021-10-01 LAB — TYPE AND SCREEN
ABO/RH(D): B NEG
Antibody Screen: NEGATIVE
Unit division: 0
Unit division: 0
Unit division: 0
Unit division: 0

## 2021-10-01 LAB — BASIC METABOLIC PANEL
Anion gap: 2 — ABNORMAL LOW (ref 5–15)
BUN: 30 mg/dL — ABNORMAL HIGH (ref 8–23)
CO2: 21 mmol/L — ABNORMAL LOW (ref 22–32)
Calcium: 7.6 mg/dL — ABNORMAL LOW (ref 8.9–10.3)
Chloride: 113 mmol/L — ABNORMAL HIGH (ref 98–111)
Creatinine, Ser: 1.83 mg/dL — ABNORMAL HIGH (ref 0.61–1.24)
GFR, Estimated: 35 mL/min — ABNORMAL LOW (ref >60)
Glucose, Bld: 91 mg/dL (ref 70–99)
Potassium: 3.6 mmol/L (ref 3.5–5.1)
Sodium: 136 mmol/L (ref 135–145)

## 2021-10-01 LAB — BPAM RBC
Blood Product Expiration Date: 202306102359
Blood Product Expiration Date: 202306172359
Blood Product Expiration Date: 202306222359
Blood Product Expiration Date: 202306232359
ISSUE DATE / TIME: 202305151304
ISSUE DATE / TIME: 202305160652
ISSUE DATE / TIME: 202305161101
ISSUE DATE / TIME: 202305161410
Unit Type and Rh: 1700
Unit Type and Rh: 1700
Unit Type and Rh: 1700
Unit Type and Rh: 1700

## 2021-10-01 LAB — CBC WITH DIFFERENTIAL/PLATELET
Abs Immature Granulocytes: 1.47 10*3/uL — ABNORMAL HIGH (ref 0.00–0.07)
Basophils Absolute: 0 10*3/uL (ref 0.0–0.1)
Basophils Relative: 0 %
Eosinophils Absolute: 0 10*3/uL (ref 0.0–0.5)
Eosinophils Relative: 0 %
HCT: 22.4 % — ABNORMAL LOW (ref 39.0–52.0)
Hemoglobin: 7.3 g/dL — ABNORMAL LOW (ref 13.0–17.0)
Immature Granulocytes: 8 %
Lymphocytes Relative: 8 %
Lymphs Abs: 1.6 10*3/uL (ref 0.7–4.0)
MCH: 29.4 pg (ref 26.0–34.0)
MCHC: 32.6 g/dL (ref 30.0–36.0)
MCV: 90.3 fL (ref 80.0–100.0)
Monocytes Absolute: 6.2 10*3/uL — ABNORMAL HIGH (ref 0.1–1.0)
Monocytes Relative: 33 %
Neutro Abs: 9.4 10*3/uL — ABNORMAL HIGH (ref 1.7–7.7)
Neutrophils Relative %: 51 %
Platelets: 27 10*3/uL — CL (ref 150–400)
RBC: 2.48 MIL/uL — ABNORMAL LOW (ref 4.22–5.81)
RDW: 18.8 % — ABNORMAL HIGH (ref 11.5–15.5)
WBC: 18.6 10*3/uL — ABNORMAL HIGH (ref 4.0–10.5)
nRBC: 1.4 % — ABNORMAL HIGH (ref 0.0–0.2)

## 2021-10-01 LAB — CBC
HCT: 28.4 % — ABNORMAL LOW (ref 39.0–52.0)
Hemoglobin: 9.1 g/dL — ABNORMAL LOW (ref 13.0–17.0)
MCH: 27.3 pg (ref 26.0–34.0)
MCHC: 32 g/dL (ref 30.0–36.0)
MCV: 85.3 fL (ref 80.0–100.0)
Platelets: 41 10*3/uL — ABNORMAL LOW (ref 150–400)
RBC: 3.33 MIL/uL — ABNORMAL LOW (ref 4.22–5.81)
RDW: 19.7 % — ABNORMAL HIGH (ref 11.5–15.5)
WBC: 14.1 10*3/uL — ABNORMAL HIGH (ref 4.0–10.5)
nRBC: 1.4 % — ABNORMAL HIGH (ref 0.0–0.2)

## 2021-10-01 MED ORDER — CEFAZOLIN SODIUM-DEXTROSE 2-4 GM/100ML-% IV SOLN
2.0000 g | INTRAVENOUS | Status: AC
  Administered 2021-10-02: 2 g via INTRAVENOUS
  Filled 2021-10-01: qty 100

## 2021-10-01 MED ORDER — SODIUM CHLORIDE 0.9% IV SOLUTION: Freq: Once | INTRAVENOUS | Status: DC

## 2021-10-01 MED ORDER — SODIUM CHLORIDE 0.9% IV SOLUTION: Freq: Once | INTRAVENOUS | Status: AC

## 2021-10-01 MED ORDER — OXYCODONE HCL 5 MG PO TABS
5.0000 mg | ORAL_TABLET | Freq: Four times a day (QID) | ORAL | Status: DC | PRN
  Administered 2021-10-01 – 2021-10-03 (×4): 5 mg via ORAL
  Filled 2021-10-01 (×4): qty 1

## 2021-10-01 MED ORDER — CHLORHEXIDINE GLUCONATE CLOTH 2 % EX PADS: 6.0000 | MEDICATED_PAD | Freq: Once | CUTANEOUS | Status: DC

## 2021-10-01 MED ORDER — CHLORHEXIDINE GLUCONATE CLOTH 2 % EX PADS
6.0000 | MEDICATED_PAD | Freq: Once | CUTANEOUS | Status: AC
  Administered 2021-10-02: 6 via TOPICAL

## 2021-10-01 MED ORDER — CHLORHEXIDINE GLUCONATE CLOTH 2 % EX PADS
6.0000 | MEDICATED_PAD | Freq: Once | CUTANEOUS | Status: AC
  Administered 2021-10-01: 6 via TOPICAL

## 2021-10-01 NOTE — Consult Note (Signed)
[facility] CM Inpatient Consult ? ? ?10/01/2021 ? ?[name] ?1930/01/27 ?[id number] ? ?[facility] CM) ?  ?Patient chart has been reviewed with noted high risk score for unplanned readmissions and 30 day unplanned readmission.  Patient assessed for community [facility] follow up needs. Per review, patient primary provider office offers chronic care management service outside of [facility] CM network. [facility] CM does not follow. ?  ?Of note, [facility] Care Management services does not replace or interfere with any services that are arranged by inpatient case management or social work.  ?  ?Netta Cedars, MSN, RN ?[facility] Liaison ?Toll free office [phone]  ?

## 2021-10-01 NOTE — Progress Notes (Signed)
?PROGRESS NOTE ? ? ? ?[name]  CNO:[id number] DOB: 1930-04-26 DOA: 09/29/2021 ?PCP: Sharilyn Sites, MD ? ? ?Brief Narrative:  ?  ?[name] is a 88 y.o. male with medical history significant of MDS, anemia/thrombocytopenia, chronic kidney disease stage IIIb, steroid-dependent COPD, chronic hypotension on midodrine, presents to the emergency room with fatigue after a fall.  Patient was recently admitted to the hospital from 5/11 to 5/13, after he suffered a fall and developed a significant gluteal hematoma resulting in blood loss anemia.  He was transfused PRBC.  Follow-up hemoglobin was improved and remained stable over 24 hours.  Patient was ambulated and did not have any orthostatic symptoms, he felt well and decision made to send him home.  He was seen by physical therapy at that time and ambulated without difficulty.  He was set up with home health PT.  He reports that he was doing well upon returning home.  Unfortunately, last night he did have a few episodes of vomiting which she describes as clear material.  This did not contain any blood.  He did not have any fever.  His bowel movements have been normal to that point.  Did not have any abdominal pain.  He took some Pepto-Bismol which resolved his vomiting.  He subsequently went to bed, and got up in the middle the night to urinate.  While walking to the bathroom, he reported that his knee gave out causing him to fall on his left side again.  Denies any dizziness or lightheadedness prior to fall.  Reports falling on carpet.  He subsequently stood up, use the bathroom and went back to bed.  Upon waking up in the morning, he felt increasingly fatigued and lightheaded.  He did not feel that he was doing well and called EMS.  Upon their arrival, he was noted to have low blood pressure and received a bolus of IV fluids.Marland Kitchen  Upon recheck of labs in ED, hemoglobin had dropped down from 8.2 on last discharge to 6.9 on ER evaluation.  He was transfused 1  unit PRBC.  He was also noted to be hypotensive, which responded to volume repletion.  CT imaging showed new/enlarging left gluteal hematoma.  No reported intra-abdominal/retroperitoneal bleeding.  No reported bony injuries. ? ?Assessment & Plan: ?  ?Principal Problem: ?  Acute blood loss anemia ?Active Problems: ?  Myelodysplastic syndrome ([place]) ?  Thrombocytopenia ([place] [employer]) ?  CKD (chronic kidney disease), stage IIIB ?  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 ?  Body mass index (BMI) 19.9 or less, adult ?  Chronic hypotension ?  Fall at home, initial encounter ?  Traumatic hematoma of left hip ? ?Assessment and Plan: ? ? ?Acute on chronic blood loss anemia. ?-No evidence of GI bleeding at this time, FOBT negative ?-He has had worsening bleeding related to fall in left gluteal region. ?-He is being transfused PRBC, goal is to keep hemoglobin greater than 8 ?-Hemoglobin on admission noted to be 6.9 ?-Thus far, is received a total of 4 units of PRBC ?-Most recent hemoglobin after transfusion noted to be 10.5 and currently remaining stable ?  ?Thrombocytopenia ?-Secondary to MDS ?-Platelet count on admission noted to be 32K ?-He received 1 unit of platelets due to enlarging hematoma ?-Follow-up platelet count has improved ?-Goal is to keep platelets greater than 50,000; will require repeat platelet transfusion 5/17 due to dropping platelet counts ?  ?Steroid dependent COPD ?-Continue on home dose of prednisone ?-Does  not have wheezing at this time ?-Continue bronchodilators ?  ?CKD stage IIIb ?-Creatinine is near prior values from last admission ?-Continue to monitor as he has been volume repleted ?  ?Chronic hypotension ?-Continue on midodrine ?-We will apply TED hose ?  ?MDS ?-Continue follow-up with oncology ?  ?Fall with left gluteal hematoma ?-Second fall in a week resulting in significant injury ?-Does not recall dizziness, lightheadedness syncope  prior to fall.  Simply states that his knee gives out. ?-We will request physical therapy to reevaluate ?-Continue pain management ?-Continue to ice left hip/gluteal region ?  ?  ?DVT prophylaxis: Place TED hose Start: 09/29/21 1728 ?SCDs Start: 09/29/21 1727 ?  ?Code Status: DNR ?Family Communication: Discussed with patient ?Disposition Plan: Status is: Inpatient ?Remains inpatient appropriate because: Continued monitoring of hemoglobin and hemodynamic stability as well as further bleeding/enlargement hematoma ?  ?  ?  ?  ?Consultants:  ?None  ?  ?Procedures:  ? None ?  ?Antimicrobials:  ? None ?  ? ?Subjective: ?Patient seen and evaluated today with no new acute complaints or concerns. No acute concerns or events noted overnight.  Noted to have minimal ongoing pain to gluteal region. ? ?Objective: ?Vitals:  ? 10/01/21 0813 10/01/21 0900 10/01/21 1000 10/01/21 1049  ?BP:  (!) 109/44 (!) 93/42 (!) 95/42  ?Pulse: 72 74 68 70  ?Resp: '14 17 16 14  '$ ?Temp:    97.6 ?F (36.4 ?C)  ?TempSrc:    Oral  ?SpO2: 100% 97% 97% 97%  ?Weight:      ?Height:      ? ? ?Intake/Output Summary (Last 24 hours) at 10/01/2021 1059 ?Last data filed at 10/01/2021 1055 ?Gross per 24 hour  ?Intake 1414.32 ml  ?Output 1885 ml  ?Net -470.68 ml  ? ?Filed Weights  ? 09/29/21 0943 09/29/21 1429 09/30/21 0423  ?Weight: 55.9 kg 56.7 kg 60.1 kg  ? ? ?Examination: ? ?General exam: Appears calm and comfortable  ?Respiratory system: Clear to auscultation. Respiratory effort normal. ?Cardiovascular system: S1 & S2 heard, RRR.  ?Gastrointestinal system: Abdomen is soft ?Central nervous system: Alert and awake ?Extremities: No edema ?Skin: No significant lesions noted ?Psychiatry: Flat affect. ? ? ? ?Data Reviewed: I have personally reviewed following labs and imaging studies ? ?CBC: ?Recent Labs  ?Lab 09/25/21 ?1214 09/26/21 ?2409 09/29/21 ?0959 09/29/21 ?1722 09/30/21 ?0450 09/30/21 ?1744 10/01/21 ?0430  ?WBC 25.6*   < > 17.9* 18.6* 16.2* 17.6* 14.1*   ?NEUTROABS 13.6*  --  8.8* 9.4*  --   --   --   ?HGB 6.2*   < > 6.9* 7.3* 5.6* 10.5* 9.1*  ?HCT 19.8*   < > 21.7* 22.4* 17.8* 32.5* 28.4*  ?MCV 88.0   < > 90.8 90.3 90.4 84.9 85.3  ?PLT 41*   < > 32* 27* 60* 51* 41*  ? < > = values in this interval not displayed.  ? ?Basic Metabolic Panel: ?Recent Labs  ?Lab 09/25/21 ?1214 09/26/21 ?7353 09/29/21 ?2992 09/30/21 ?0450 10/01/21 ?0430  ?NA 137 137 136 137 136  ?K 3.6 3.3* 3.9 3.7 3.6  ?CL 110 114* 111 114* 113*  ?CO2 20* 19* 20* 20* 21*  ?GLUCOSE 96 90 92 103* 91  ?BUN 38* 35* 38* 36* 30*  ?CREATININE 2.46* 2.14* 2.33* 2.06* 1.83*  ?CALCIUM 8.8* 8.3* 8.6* 7.8* 7.6*  ?MG  --  1.9  --   --   --   ? ?GFR: ?Estimated Creatinine Clearance: 22.8 mL/min (A) (by C-G formula based  on SCr of 1.83 mg/dL (H)). ?Liver Function Tests: ?Recent Labs  ?Lab 09/25/21 ?1214 09/26/21 ?8127 09/29/21 ?5170 09/30/21 ?0450  ?AST '26 21 22 18  '$ ?ALT '15 13 13 13  '$ ?ALKPHOS 68 60 63 52  ?BILITOT 1.3* 1.3* 1.4* 0.8  ?PROT 6.9 6.0* 6.6 5.4*  ?ALBUMIN 3.3* 2.8* 3.0* 2.6*  ? ?No results for input(s): LIPASE, AMYLASE in the last 168 hours. ?No results for input(s): AMMONIA in the last 168 hours. ?Coagulation Profile: ?Recent Labs  ?Lab 09/29/21 ?0959 09/30/21 ?0450  ?INR 1.3* 1.3*  ? ?Cardiac Enzymes: ?No results for input(s): CKTOTAL, CKMB, CKMBINDEX, TROPONINI in the last 168 hours. ?BNP (last 3 results) ?No results for input(s): PROBNP in the last 8760 hours. ?HbA1C: ?No results for input(s): HGBA1C in the last 72 hours. ?CBG: ?No results for input(s): GLUCAP in the last 168 hours. ?Lipid Profile: ?No results for input(s): CHOL, HDL, LDLCALC, TRIG, CHOLHDL, LDLDIRECT in the last 72 hours. ?Thyroid Function Tests: ?No results for input(s): TSH, T4TOTAL, FREET4, T3FREE, THYROIDAB in the last 72 hours. ?Anemia Panel: ?No results for input(s): VITAMINB12, FOLATE, FERRITIN, TIBC, IRON, RETICCTPCT in the last 72 hours. ?Sepsis Labs: ?Recent Labs  ?Lab 09/25/21 ?1214 09/25/21 ?1410  ?LATICACIDVEN 2.0* 2.0*   ? ? ?Recent Results (from the past 240 hour(s))  ?Culture, blood (Routine X 2) w Reflex to ID Panel     Status: None  ? Collection Time: 09/25/21 12:14 PM  ? Specimen: BLOOD LEFT ARM  ?Result Value Ref Range S

## 2021-10-01 NOTE — Plan of Care (Signed)
?  Problem: Acute Rehab PT Goals(only PT should resolve) ?Goal: Pt Will Go Supine/Side To Sit ?Outcome: Progressing ?Flowsheets (Taken 10/01/2021 1217) ?Pt will go Supine/Side to Sit: ? with min guard assist ? with minimal assist ?Goal: Patient Will Transfer Sit To/From Stand ?Outcome: Progressing ?Flowsheets (Taken 10/01/2021 1217) ?Patient will transfer sit to/from stand: ? with min guard assist ? with minimal assist ?Goal: Pt Will Transfer Bed To Chair/Chair To Bed ?Outcome: Progressing ?Flowsheets (Taken 10/01/2021 1217) ?Pt will Transfer Bed to Chair/Chair to Bed: with min assist ?Goal: Pt Will Ambulate ?Outcome: Progressing ?Flowsheets (Taken 10/01/2021 1217) ?Pt will Ambulate: ? 50 feet ? with minimal assist ? with rolling walker ?  ?12:17 PM, 10/01/21 ?Lonell Grandchild, MPT ?Physical Therapist with [place] ?[facility] ?[phone] office ?[phone] mobile phone ? ?

## 2021-10-01 NOTE — Evaluation (Signed)
Physical Therapy Evaluation ?Patient Details ?Name: [name] ?MRN: [id number] ?DOB: August 16, 1929 ?Today's Date: 10/01/2021 ? ?History of Present Illness ? [name] is a 88 y.o. male with medical history significant of MDS, anemia/thrombocytopenia, chronic kidney disease stage IIIb, steroid-dependent COPD, chronic hypotension on midodrine, presents to the emergency room with fatigue after a fall.  Patient was recently admitted to the hospital from 5/11 to 5/13, after he suffered a fall and developed a significant gluteal hematoma resulting in blood loss anemia.  He was transfused PRBC.  Follow-up hemoglobin was improved and remained stable over 24 hours.  Patient was ambulated and did not have any orthostatic symptoms, he felt well and decision made to send him home.  He was seen by physical therapy at that time and ambulated without difficulty.  He was set up with home health PT.  He reports that he was doing well upon returning home.  Unfortunately, last night he did have a few episodes of vomiting which she describes as clear material.  This did not contain any blood.  He did not have any fever.  His bowel movements have been normal to that point.  Did not have any abdominal pain.  He took some Pepto-Bismol which resolved his vomiting.  He subsequently went to bed, and got up in the middle the night to urinate.  While walking to the bathroom, he reported that his knee gave out causing him to fall on his left side again.  Denies any dizziness or lightheadedness prior to fall.  Reports falling on carpet.  He subsequently stood up, use the bathroom and went back to bed.  Upon waking up in the morning, he felt increasingly fatigued and lightheaded.  He did not feel that he was doing well and called EMS.  Upon their arrival, he was noted to have low blood pressure and received a bolus of IV fluids.Marland Kitchen  Upon recheck of labs in ED, hemoglobin had dropped down from 8.2 on last discharge to 6.9 on ER evaluation.   He was transfused 1 unit PRBC.  He was also noted to be hypotensive, which responded to volume repletion.  CT imaging showed new/enlarging left gluteal hematoma.  No reported intra-abdominal/retroperitoneal bleeding.  No reported bony injuries. ?  ?Clinical Impression ? Patient demonstrates slow labored movement for sitting up at bedside requiring frequent rest breaks due to increasing left hip pain, very unsteady on feet with high risk for falls due to poor standing balance and limited weightbearing on LLE due to hip pain.  Patient tolerated sitting up in chair after therapy - RN notified.  Patient will benefit from continued skilled physical therapy in hospital and recommended venue below to increase strength, balance, endurance for safe ADLs and gait.  ? ?   ?   ? ?Recommendations for follow up therapy are one component of a multi-disciplinary discharge planning process, led by the attending physician.  Recommendations may be updated based on patient status, additional functional criteria and insurance authorization. ? ?Follow Up Recommendations Skilled nursing-short term rehab (<3 hours/day) ? ?  ?Assistance Recommended at Discharge Intermittent Supervision/Assistance  ?Patient can return home with the following ? A little help with bathing/dressing/bathroom;Help with stairs or ramp for entrance;Assistance with cooking/housework;A lot of help with walking and/or transfers ? ?  ?Equipment Recommendations None recommended by PT  ?Recommendations for Other Services ?    ?  ?Functional Status Assessment Patient has had a recent decline in their functional status and demonstrates the ability to make  significant improvements in function in a reasonable and predictable amount of time.  ? ?  ?Precautions / Restrictions Precautions ?Precautions: Fall ?Restrictions ?Weight Bearing Restrictions: No  ? ?  ? ?Mobility ? Bed Mobility ?Overal bed mobility: Needs Assistance ?Bed Mobility: Supine to Sit ?  ?  ?Supine to sit: Min  assist, Mod assist ?  ?  ?General bed mobility comments: increased time, labored movement with c/o severe left hip pain ?  ? ?Transfers ?Overall transfer level: Needs assistance ?Equipment used: Rolling walker (2 wheels) ?Transfers: Sit to/from Stand, Bed to chair/wheelchair/BSC ?Sit to Stand: Min assist ?  ?Step pivot transfers: Min assist, Mod assist ?  ?  ?  ?General transfer comment: slow labored unsteady movement with poor tolerance for weighbearing on LLE due to increasing hip pain ?  ? ?Ambulation/Gait ?  ?Gait Distance (Feet): 5 Feet ?Assistive device: Rolling walker (2 wheels) ?Gait Pattern/deviations: Decreased step length - right, Decreased step length - left, Decreased stride length, Trunk flexed ?Gait velocity: decreased ?  ?  ?General Gait Details: limited to a few slow labored side steps due to generalized weakness and c/o severe left hip pain when weightbearing ? ?Stairs ?  ?  ?  ?  ?  ? ?Wheelchair Mobility ?  ? ?Modified Rankin (Stroke Patients Only) ?  ? ?  ? ?Balance Overall balance assessment: Needs assistance ?Sitting-balance support: Feet supported, No upper extremity supported ?Sitting balance-Leahy Scale: Fair ?Sitting balance - Comments: fair/good seated at EOB ?  ?Standing balance support: During functional activity, Bilateral upper extremity supported ?Standing balance-Leahy Scale: Poor ?Standing balance comment: fair/poor using RW ?  ?  ?  ?  ?  ?  ?  ?  ?  ?  ?  ?   ? ? ? ?Pertinent Vitals/Pain Pain Assessment ?Pain Assessment: Faces ?Faces Pain Scale: Hurts whole lot ?Pain Location: left hip ?Pain Descriptors / Indicators: Sore, Grimacing, Guarding, Sharp ?Pain Intervention(s): Limited activity within patient's tolerance, Monitored during session, Premedicated before session, Repositioned  ? ? ?Home Living Family/patient expects to be discharged to:: Private residence ?Living Arrangements: Alone ?Available Help at Discharge: Family;Available PRN/intermittently ?Type of Home: Mobile  home ?Home Access: Ramped entrance ?  ?  ?  ?Home Layout: One level ?Home Equipment: Conservation officer, nature (2 wheels);Cane - single point;Electric scooter;BSC/3in1;Shower seat - built in ?   ?  ?Prior Function Prior Level of Function : Independent/Modified Independent ?  ?  ?  ?  ?  ?  ?Mobility Comments: household ambulator using RW, uses SPC for community, drives ?ADLs Comments: Independent ?  ? ? ?Hand Dominance  ? Dominant Hand: Right ? ?  ?Extremity/Trunk Assessment  ? Upper Extremity Assessment ?Upper Extremity Assessment: Generalized weakness ?  ? ?Lower Extremity Assessment ?Lower Extremity Assessment: Generalized weakness ?  ? ?Cervical / Trunk Assessment ?Cervical / Trunk Assessment: Normal  ?Communication  ? Communication: No difficulties  ?Cognition Arousal/Alertness: Awake/alert ?Behavior During Therapy: [facility] for tasks assessed/performed ?Overall Cognitive Status: Within Functional Limits for tasks assessed ?  ?  ?  ?  ?  ?  ?  ?  ?  ?  ?  ?  ?  ?  ?  ?  ?  ?  ?  ? ?  ?General Comments   ? ?  ?Exercises    ? ?Assessment/Plan  ?  ?PT Assessment Patient needs continued PT services  ?PT Problem List Decreased strength;Decreased activity tolerance;Decreased balance;Pain;Decreased mobility ? ?   ?  ?PT Treatment Interventions  DME instruction;Gait training;Stair training;Functional mobility training;Therapeutic activities;Therapeutic exercise;Patient/family education;Balance training   ? ?PT Goals (Current goals can be found in the Care Plan section)  ?Acute Rehab PT Goals ?Patient Stated Goal: return home after rehab ?PT Goal Formulation: With patient ?Time For Goal Achievement: 10/15/21 ?Potential to Achieve Goals: Good ? ?  ?Frequency Min 3X/week ?  ? ? ?Co-evaluation   ?  ?  ?  ?  ? ? ?  ?AM-PAC PT "6 Clicks" Mobility  ?Outcome Measure Help needed turning from your back to your side while in a flat bed without using bedrails?: None ?Help needed moving from lying on your back to sitting on the side of a flat bed  without using bedrails?: A Lot ?Help needed moving to and from a bed to a chair (including a wheelchair)?: A Lot ?Help needed standing up from a chair using your arms (e.g., wheelchair or bedside chair)?

## 2021-10-01 NOTE — Progress Notes (Signed)
[facility] ? ?Plans to place port tomorrow. Had a prior port on the left and this became infected. Plan for placement on the right tomorrow. Will have platelets at that time. ? ?Orders in. Team updated. Dr. Larna Daughters in agree and patient on board. ? ?[name], MD ?[facility] ?[place], [place] [address] ?[phone] (office) ? ?

## 2021-10-02 ENCOUNTER — Other Ambulatory Visit: Payer: Self-pay

## 2021-10-02 ENCOUNTER — Inpatient Hospital Stay (HOSPITAL_COMMUNITY): Payer: Medicare Other

## 2021-10-02 ENCOUNTER — Encounter (HOSPITAL_COMMUNITY)
Admission: EM | Disposition: A | Discharge status: Skilled Nursing Facility | Payer: Self-pay | Source: Home / Self Care | Attending: Internal Medicine

## 2021-10-02 ENCOUNTER — Encounter (HOSPITAL_COMMUNITY): Payer: Self-pay | Admitting: Internal Medicine

## 2021-10-02 ENCOUNTER — Inpatient Hospital Stay (HOSPITAL_COMMUNITY): Payer: Medicare Other | Admitting: Anesthesiology

## 2021-10-02 DIAGNOSIS — D62 Acute posthemorrhagic anemia: Secondary | ICD-10-CM | POA: Diagnosis not present

## 2021-10-02 DIAGNOSIS — J449 Chronic obstructive pulmonary disease, unspecified: Secondary | ICD-10-CM

## 2021-10-02 DIAGNOSIS — Z87891 Personal history of nicotine dependence: Secondary | ICD-10-CM

## 2021-10-02 DIAGNOSIS — N289 Disorder of kidney and ureter, unspecified: Secondary | ICD-10-CM

## 2021-10-02 DIAGNOSIS — D696 Thrombocytopenia, unspecified: Secondary | ICD-10-CM

## 2021-10-02 DIAGNOSIS — D469 Myelodysplastic syndrome, unspecified: Secondary | ICD-10-CM | POA: Diagnosis not present

## 2021-10-02 DIAGNOSIS — M1990 Unspecified osteoarthritis, unspecified site: Secondary | ICD-10-CM

## 2021-10-02 HISTORY — PX: PORTACATH PLACEMENT: SHX2246

## 2021-10-02 LAB — BASIC METABOLIC PANEL
Anion gap: 3 — ABNORMAL LOW (ref 5–15)
BUN: 26 mg/dL — ABNORMAL HIGH (ref 8–23)
CO2: 20 mmol/L — ABNORMAL LOW (ref 22–32)
Calcium: 8 mg/dL — ABNORMAL LOW (ref 8.9–10.3)
Chloride: 112 mmol/L — ABNORMAL HIGH (ref 98–111)
Creatinine, Ser: 1.74 mg/dL — ABNORMAL HIGH (ref 0.61–1.24)
GFR, Estimated: 37 mL/min — ABNORMAL LOW (ref >60)
Glucose, Bld: 81 mg/dL (ref 70–99)
Potassium: 3.8 mmol/L (ref 3.5–5.1)
Sodium: 135 mmol/L (ref 135–145)

## 2021-10-02 LAB — BPAM PLATELET PHERESIS
Blood Product Expiration Date: 202305182359
Blood Product Expiration Date: 202305192359
ISSUE DATE / TIME: 202305151845
ISSUE DATE / TIME: 202305171043
Unit Type and Rh: 5100
Unit Type and Rh: 7300

## 2021-10-02 LAB — CBC
HCT: 28.4 % — ABNORMAL LOW (ref 39.0–52.0)
Hemoglobin: 8.7 g/dL — ABNORMAL LOW (ref 13.0–17.0)
MCH: 26.5 pg (ref 26.0–34.0)
MCHC: 30.6 g/dL (ref 30.0–36.0)
MCV: 86.6 fL (ref 80.0–100.0)
Platelets: 61 10*3/uL — ABNORMAL LOW (ref 150–400)
RBC: 3.28 MIL/uL — ABNORMAL LOW (ref 4.22–5.81)
RDW: 19.8 % — ABNORMAL HIGH (ref 11.5–15.5)
WBC: 14.6 10*3/uL — ABNORMAL HIGH (ref 4.0–10.5)
nRBC: 1.4 % — ABNORMAL HIGH (ref 0.0–0.2)

## 2021-10-02 LAB — PREPARE PLATELET PHERESIS
Unit division: 0
Unit division: 0

## 2021-10-02 SURGERY — INSERTION, TUNNELED CENTRAL VENOUS DEVICE, WITH PORT
Anesthesia: General | Site: Chest | Laterality: Right

## 2021-10-02 MED ORDER — HEPARIN SOD (PORK) LOCK FLUSH 100 UNIT/ML IV SOLN
INTRAVENOUS | Status: AC
  Filled 2021-10-02: qty 5

## 2021-10-02 MED ORDER — CHLORHEXIDINE GLUCONATE 0.12 % MT SOLN
15.0000 mL | Freq: Once | OROMUCOSAL | Status: AC
  Administered 2021-10-02: 15 mL via OROMUCOSAL

## 2021-10-02 MED ORDER — ALUM & MAG HYDROXIDE-SIMETH 200-200-20 MG/5ML PO SUSP
30.0000 mL | ORAL | Status: DC | PRN
  Filled 2021-10-02: qty 30

## 2021-10-02 MED ORDER — FENTANYL CITRATE PF 50 MCG/ML IJ SOSY
PREFILLED_SYRINGE | INTRAMUSCULAR | Status: AC
  Filled 2021-10-02: qty 1

## 2021-10-02 MED ORDER — SODIUM CHLORIDE (PF) 0.9 % IJ SOLN
INTRAMUSCULAR | Status: DC | PRN
  Administered 2021-10-02: 500 mL

## 2021-10-02 MED ORDER — PROPOFOL 500 MG/50ML IV EMUL
INTRAVENOUS | Status: DC | PRN
  Administered 2021-10-02: 25 ug/kg/min via INTRAVENOUS

## 2021-10-02 MED ORDER — FENTANYL CITRATE PF 50 MCG/ML IJ SOSY
25.0000 ug | PREFILLED_SYRINGE | INTRAMUSCULAR | Status: AC | PRN
  Administered 2021-10-02 (×2): 25 ug via INTRAVENOUS

## 2021-10-02 MED ORDER — HEPARIN SOD (PORK) LOCK FLUSH 100 UNIT/ML IV SOLN
INTRAVENOUS | Status: DC | PRN
  Administered 2021-10-02: 500 [IU] via INTRAVENOUS

## 2021-10-02 MED ORDER — ORAL CARE MOUTH RINSE: 15.0000 mL | Freq: Once | OROMUCOSAL | Status: AC

## 2021-10-02 MED ORDER — LIDOCAINE HCL (PF) 1 % IJ SOLN
INTRAMUSCULAR | Status: AC
  Filled 2021-10-02: qty 30

## 2021-10-02 MED ORDER — PROPOFOL 10 MG/ML IV BOLUS
INTRAVENOUS | Status: DC | PRN
  Administered 2021-10-02: 30 mg via INTRAVENOUS

## 2021-10-02 MED ORDER — LIDOCAINE HCL (PF) 1 % IJ SOLN
INTRAMUSCULAR | Status: DC | PRN
Start: 2021-10-02 — End: 2021-10-02
  Administered 2021-10-02: 12 mL

## 2021-10-02 MED ORDER — FENTANYL CITRATE PF 50 MCG/ML IJ SOSY: 25.0000 ug | PREFILLED_SYRINGE | INTRAMUSCULAR | Status: DC | PRN

## 2021-10-02 MED ORDER — LACTATED RINGERS IV SOLN: INTRAVENOUS | Status: DC

## 2021-10-02 SURGICAL SUPPLY — 37 items
ADH SKN CLS APL DERMABOND .7 (GAUZE/BANDAGES/DRESSINGS) ×1
APL PRP STRL LF ISPRP CHG 10.5 (MISCELLANEOUS) ×1
APPLICATOR CHLORAPREP 10.5 ORG (MISCELLANEOUS) ×2 IMPLANT
BAG DECANTER FOR FLEXI CONT (MISCELLANEOUS) ×2 IMPLANT
CLOTH BEACON ORANGE TIMEOUT ST (SAFETY) ×2 IMPLANT
COVER LIGHT HANDLE STERIS (MISCELLANEOUS) ×4 IMPLANT
COVER PROBE U/S 5X48 (MISCELLANEOUS) ×1 IMPLANT
DECANTER SPIKE VIAL GLASS SM (MISCELLANEOUS) ×2 IMPLANT
DERMABOND ADVANCED (GAUZE/BANDAGES/DRESSINGS) ×1
DERMABOND ADVANCED .7 DNX12 (GAUZE/BANDAGES/DRESSINGS) ×1 IMPLANT
DRAPE C-ARM FOLDED MOBILE STRL (DRAPES) ×2 IMPLANT
ELECT REM PT RETURN 9FT ADLT (ELECTROSURGICAL) ×2
ELECTRODE REM PT RTRN 9FT ADLT (ELECTROSURGICAL) ×1 IMPLANT
GAUZE 4X4 16PLY ~~LOC~~+RFID DBL (SPONGE) ×2 IMPLANT
GLOVE BIO SURGEON STRL SZ 6.5 (GLOVE) ×2 IMPLANT
GLOVE BIOGEL PI IND STRL 6.5 (GLOVE) ×1 IMPLANT
GLOVE BIOGEL PI IND STRL 7.0 (GLOVE) ×2 IMPLANT
GLOVE BIOGEL PI INDICATOR 6.5 (GLOVE) ×1
GLOVE BIOGEL PI INDICATOR 7.0 (GLOVE) ×2
GLOVE ECLIPSE 6.5 STRL STRAW (GLOVE) ×1 IMPLANT
GOWN STRL REUS W/TWL LRG LVL3 (GOWN DISPOSABLE) ×4 IMPLANT
IV NS 500ML (IV SOLUTION) ×2
IV NS 500ML BAXH (IV SOLUTION) ×1 IMPLANT
KIT PORT POWER 8FR ISP MRI (Port) ×2 IMPLANT
KIT TURNOVER KIT A (KITS) ×2 IMPLANT
MANIFOLD NEPTUNE II (INSTRUMENTS) ×2 IMPLANT
NDL HYPO 25X1 1.5 SAFETY (NEEDLE) ×1 IMPLANT
NEEDLE HYPO 25X1 1.5 SAFETY (NEEDLE) ×2 IMPLANT
PACK MINOR (CUSTOM PROCEDURE TRAY) ×2 IMPLANT
PAD ARMBOARD 7.5X6 YLW CONV (MISCELLANEOUS) ×2 IMPLANT
SET BASIN LINEN APH (SET/KITS/TRAYS/PACK) ×2 IMPLANT
SUT MNCRL AB 4-0 PS2 18 (SUTURE) ×2 IMPLANT
SUT PROLENE 2 0 SH 30 (SUTURE) ×2 IMPLANT
SUT VIC AB 3-0 SH 27 (SUTURE) ×2
SUT VIC AB 3-0 SH 27X BRD (SUTURE) ×1 IMPLANT
SYR 10ML LL (SYRINGE) ×4 IMPLANT
SYR CONTROL 10ML LL (SYRINGE) ×2 IMPLANT

## 2021-10-02 NOTE — Progress Notes (Signed)
[name]  Updated his daughter. CXR ordered. Updated team.   [name], MD [facility] [address] [place], [place] [address] [phone] (office)

## 2021-10-02 NOTE — TOC Progression Note (Signed)
Transition of Care [facility]) - Progression Note    Patient Details  Name: [name] MRN: [id number] Date of Birth: 1929/11/01  [facility] CM/SW Contact  Boneta Lucks, RN Phone Number: 10/02/2021, 11:51 AM  Clinical Narrative:   MD spoke with daughter, she is now accepting PT recommendation for SNF.  She has not choices. FL2 completed and sent out for bed offers. DC planning for tomorrow if labs remain stable. TOC to follow.   Expected Discharge Plan: [place] Barriers to Discharge: Continued Medical Work up  Expected Discharge Plan and Services Expected Discharge Plan: [place]      Readmission Risk Interventions    10/02/2021   11:51 AM 09/30/2021    2:59 PM 09/26/2021    1:35 PM  Readmission Risk Prevention Plan  Transportation Screening Complete  Complete  Medication Review Press photographer)  Complete Complete  PCP or Specialist appointment within 3-5 days of discharge  Not Complete Not Complete  HRI or Home Care Consult  Complete Complete  SW Recovery Care/Counseling Consult Complete    Palliative Care Screening Not [place] Complete

## 2021-10-02 NOTE — NC FL2 (Signed)
[place] LEVEL OF CARE SCREENING TOOL     IDENTIFICATION  Patient Name: [name] Birthdate: 23-Jul-1929 Sex: male Admission Date (Current Location): 09/29/2021  [facility] and [place] Number:  [employer] and Address:  [place] [address], [place]      Provider Number: [id number]  Attending Physician Name and Address:  Rodena Goldmann, DO  Relative Name and Phone Number:  Lahoma Rocker ( Daughter)  [phone]    Current Level of Care: Hospital Recommended Level of Care: [place] Prior Approval Number:    Date Approved/Denied:   PASRR Number: [id number]  Discharge Plan: SNF    Current Diagnoses: Patient Active Problem List   Diagnosis Date Noted   Acute blood loss anemia 09/29/2021   Traumatic hematoma of left hip 09/29/2021   Pressure injury of skin 09/25/2021   Fall at home, initial encounter 09/25/2021   Symptomatic anemia 09/20/2021   Chronic hypotension 09/20/2021   Small bowel bleed requiring more than 4 units of blood in 24 hours, ICU, or surgery 09/15/2021   Pulmonary infiltrates on CXR 08/30/2021   Hemorrhage from open wound of left chest wall 08/12/2021   Severe sepsis ([place]) 08/09/2021   AKI (acute kidney injury) ([place]) 08/09/2021   Infected venous access port, initial encounter 08/07/2021   Acute on chronic respiratory failure with hypoxia ([place]) 07/22/2021   Hypokalemia 07/22/2021   [place]    Goals of care, counseling/discussion 07/15/2021   Protein-calorie malnutrition, moderate (HCC) 07/14/2021   Heme positive stool 07/13/2021   Acute on chronic anemia 07/12/2021   Near syncope 06/17/2021   Neoplastic malignant related fatigue 01/21/2021   Depression 12/02/2020   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   Hematuria 12/02/2020   Osteoporosis 12/02/2020   Low back pain 12/02/2020   Syncope 11/11/2020   COVID-19 virus infection 11/11/2020   COPD Group B  clinically, mild/mod  severity  04/03/2020   Acute on chronic blood loss anemia 04/03/2020   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 04/03/2020   Urinary frequency 02/26/2020   Benign prostatic hyperplasia with urinary obstruction 02/26/2020   Myelodysplastic syndrome ([place]) 06/16/2019   HAP (hospital-acquired pneumonia) 06/16/2019   CKD (chronic kidney disease), stage IIIB 06/16/2019   Sepsis ([place]) 06/16/2019   Thrombocytopenia ([place]) 06/06/2019   Intractable back pain 06/06/2019   Chronic kidney insufficiency    Body mass index (BMI) 19.9 or less, adult 04/20/2019   Compression fracture of lumbar vertebra ([place]) 04/20/2019   Elevated blood-pressure reading, without diagnosis of hypertension 04/20/2019   Acute low back pain 03/23/2019   Pulmonary nodule 05/19/2018   Bronchiectasis ([place]) 05/19/2018   Anemia 09/17/2017   Abnormal CT of the abdomen 11/02/2016   Loss of weight 11/02/2016   History of colonic polyps 11/02/2016   GERD (gastroesophageal reflux disease) 10/01/2015    Orientation RESPIRATION BLADDER Height & Weight     Self, Time, Situation, Place  Normal Continent Weight: 58.3 kg Height:  '6\' 4"'$  (193 cm)  BEHAVIORAL SYMPTOMS/MOOD NEUROLOGICAL BOWEL NUTRITION STATUS      Continent Diet (See DC summary)  AMBULATORY STATUS COMMUNICATION OF NEEDS Skin   Extensive Assist Verbally Bruising, Skin abrasions                       Personal Care Assistance Level of Assistance  Bathing, Feeding, Dressing Bathing Assistance: Maximum assistance Feeding assistance: Limited assistance Dressing Assistance: Maximum assistance  Functional Limitations Info  Sight, Hearing, Speech Sight Info: Impaired Hearing Info: Impaired Speech Info: Adequate    SPECIAL CARE FACTORS FREQUENCY  PT (By licensed PT)     PT Frequency: 5 times a week              Contractures Contractures Info: Not present    Additional Factors Info   Code Status, Allergies Code Status Info: DNR Allergies Info: NKDA           Current Medications (10/02/2021):  This is the current hospital active medication list Current Facility-Administered Medications  Medication Dose Route Frequency Provider Last Rate Last Admin   0.9 %  sodium chloride infusion (Manually program via Guardrails IV Fluids)   Intravenous Once Virl Cagey, MD   Held at 10/01/21 1355   0.9 %  sodium chloride infusion   Intravenous Continuous Kathie Dike, MD   Stopped at 10/02/21 0918   [MAR Hold] acetaminophen (TYLENOL) tablet 650 mg  650 mg Oral Q6H PRN Kathie Dike, MD   650 mg at 10/01/21 1045   Or   [MAR Hold] acetaminophen (TYLENOL) suppository 650 mg  650 mg Rectal Q6H PRN Kathie Dike, MD       [MAR Hold] albuterol (PROVENTIL) (2.5 MG/3ML) 0.083% nebulizer solution 2.5 mg  2.5 mg Nebulization Q4H PRN Pham, Minh Q, RPH-CPP       [MAR Hold] Chlorhexidine Gluconate Cloth 2 % PADS 6 each  6 each Topical Daily Kathie Dike, MD   6 each at 10/02/21 0902   fentaNYL (SUBLIMAZE) 50 MCG/ML injection            fentaNYL (SUBLIMAZE) injection 25 mcg  25 mcg Intravenous Q5 min PRN Battula, Rajamani C, MD       heparin lock flush 100 unit/mL    PRN Virl Cagey, MD   500 Units at 10/02/21 1058   lactated ringers infusion   Intravenous Continuous Battula, Rajamani C, MD 50 mL/hr at 10/02/21 1006 Restarted at 10/02/21 1021   lidocaine (PF) (XYLOCAINE) 1 % injection    PRN Virl Cagey, MD   12 mL at 10/02/21 1020   [MAR Hold] midodrine (PROAMATINE) tablet 5 mg  5 mg Oral TID WC Kathie Dike, MD   5 mg at 10/02/21 0743   [MAR Hold] ondansetron (ZOFRAN) tablet 4 mg  4 mg Oral Q6H PRN Kathie Dike, MD       Or   [facility] Hold] ondansetron (ZOFRAN) injection 4 mg  4 mg Intravenous Q6H PRN Kathie Dike, MD   4 mg at 09/30/21 2027   [facility] Hold] oxyCODONE (Oxy IR/ROXICODONE) immediate release tablet 5 mg  5 mg Oral Q6H PRN Manuella Ghazi, Pratik D, DO   5 mg at  10/01/21 2150   [MAR Hold] predniSONE (DELTASONE) tablet 5 mg  5 mg Oral Q breakfast Kathie Dike, MD   5 mg at 10/02/21 0743   sodium chloride (PF) 0.9 % injection    PRN Virl Cagey, MD   500 mL at 10/02/21 1015   [MAR Hold] traMADol (ULTRAM) tablet 50 mg  50 mg Oral Q8H PRN Kathie Dike, MD   50 mg at 10/02/21 0457   [MAR Hold] umeclidinium-vilanterol (ANORO ELLIPTA) 62.5-25 MCG/ACT 1 puff  1 puff Inhalation Daily Kathie Dike, MD   1 puff at 10/02/21 0804   Facility-Administered Medications Ordered in Other Encounters  Medication Dose Route Frequency Provider Last Rate Last Admin   0.9 %  sodium chloride infusion   Intravenous  Continuous Derek Jack, MD   Stopped at 10/02/21 1100   octreotide (SANDOSTATIN LAR) 30 MG IM injection            sodium chloride flush (NS) 0.9 % injection 10 mL  10 mL Intracatheter PRN Derek Jack, MD   10 mL at 01/06/21 1215   sodium chloride flush (NS) 0.9 % injection 10 mL  10 mL Intravenous PRN Derek Jack, MD   10 mL at 02/17/21 0902   sodium chloride flush (NS) 0.9 % injection 10 mL  10 mL Intravenous PRN Derek Jack, MD   10 mL at 03/10/21 3244     Discharge Medications: Please see discharge summary for a list of discharge medications.  Relevant Imaging Results:  Relevant Lab Results:   Additional Information SS# [id number]  Boneta Lucks, RN

## 2021-10-02 NOTE — Transfer of Care (Signed)
Immediate Anesthesia Transfer of Care Note  Patient: [name]  Procedure(s) Performed: INSERTION PORT-A-CATH (Right: Chest)  Patient Location: PACU  Anesthesia Type:General  Level of Consciousness: awake, alert  and oriented  Airway & Oxygen Therapy: Patient Spontanous Breathing  Post-op Assessment: Report given to RN and Post -op Vital signs reviewed and stable  Post vital signs: Reviewed and stable  Last Vitals:  Vitals Value Taken Time  BP 105/52 10/02/21 1118  Temp    Pulse 73   Resp 16 10/02/21 1120  SpO2    Vitals shown include unvalidated device data.  Last Pain:  Vitals:   10/02/21 0937  TempSrc: Oral  PainSc: 10-Worst pain ever      Patients Stated Pain Goal: 0 (90/12/22 4114)  Complications: No notable events documented.

## 2021-10-02 NOTE — Progress Notes (Signed)
PROGRESS NOTE    [name]  OVF:[id number] DOB: [birth year] DOA: 09/29/2021 PCP: Sharilyn Sites, MD   Brief Narrative:    [name] is a 88 y.o. male with medical history significant of MDS, anemia/thrombocytopenia, chronic kidney disease stage IIIb, steroid-dependent COPD, chronic hypotension on midodrine, presents to the emergency room with fatigue after a fall.  Patient was recently admitted to the hospital from 5/11 to 5/13, after he suffered a fall and developed a significant gluteal hematoma resulting in blood loss anemia.  He was transfused PRBC.  Follow-up hemoglobin was improved and remained stable over 24 hours.  Patient was ambulated and did not have any orthostatic symptoms, he felt well and decision made to send him home.  He was seen by physical therapy at that time and ambulated without difficulty.  He was set up with home health PT.  He reports that he was doing well upon returning home.  Unfortunately, last night he did have a few episodes of vomiting which she describes as clear material.  This did not contain any blood.  He did not have any fever.  His bowel movements have been normal to that point.  Did not have any abdominal pain.  He took some Pepto-Bismol which resolved his vomiting.  He subsequently went to bed, and got up in the middle the night to urinate.  While walking to the bathroom, he reported that his knee gave out causing him to fall on his left side again.  Denies any dizziness or lightheadedness prior to fall.  Reports falling on carpet.  He subsequently stood up, use the bathroom and went back to bed.  Upon waking up in the morning, he felt increasingly fatigued and lightheaded.  He did not feel that he was doing well and called EMS.  Upon their arrival, he was noted to have low blood pressure and received a bolus of IV fluids.Marland Kitchen  Upon recheck of labs in ED, hemoglobin had dropped down from 8.2 on last discharge to 6.9 on ER evaluation.  He was transfused 1  unit PRBC.  He was also noted to be hypotensive, which responded to volume repletion.  CT imaging showed new/enlarging left gluteal hematoma.  No reported intra-abdominal/retroperitoneal bleeding.  No reported bony injuries.  Assessment & Plan:   Principal Problem:   Acute blood loss anemia Active Problems:   Myelodysplastic syndrome (HCC)   Thrombocytopenia (HCC)   CKD (chronic kidney disease), stage IIIB   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   Body mass index (BMI) 19.9 or less, adult   Chronic hypotension   Fall at home, initial encounter   Traumatic hematoma of left hip  Assessment and Plan:  Acute on chronic blood loss anemia. -No evidence of GI bleeding at this time, FOBT negative -He has had worsening bleeding related to fall in left gluteal region. -He is being transfused PRBC, goal is to keep hemoglobin greater than 8 -Hemoglobin on admission noted to be 6.9 -Thus far, is received a total of 4 units of PRBC -Most recent hemoglobin after transfusion noted to be 10.5 and is slowly downtrending to around 8 today -Recheck CBC in a.m.   Thrombocytopenia -Secondary to MDS -Platelet count on admission noted to be 32K -He received 1 unit of platelets due to enlarging hematoma -Follow-up platelet count has improved -Goal is to keep platelets greater than 50,000; will require repeat platelet transfusion 5/17 due to dropping platelet counts -Repeat platelet transfusion 5/18 with  port exchange   Steroid dependent COPD -Continue on home dose of prednisone -Does not have wheezing at this time -Continue bronchodilators   CKD stage IIIb -Creatinine is near prior values from last admission -Continue to monitor as he has been volume repleted   Chronic hypotension -Continue on midodrine -We will apply TED hose   MDS -Continue follow-up with oncology   Fall with left gluteal hematoma -Second fall in a  week resulting in significant injury -Does not recall dizziness, lightheadedness syncope prior to fall.  Simply states that his knee gives out. -We will request physical therapy to reevaluate -Continue pain management -Continue to ice left hip/gluteal region     DVT prophylaxis: Place TED hose Start: 09/29/21 1728 SCDs Start: 09/29/21 1727   Code Status: DNR Family Communication: Discussed with daughter on phone 5/18 Disposition Plan: Status is: Inpatient Remains inpatient appropriate because: Continued monitoring of hemoglobin and hemodynamic stability as well as further bleeding/enlargement hematoma.  PT recommending SNF on discharge and patient agreeable.       Consultants:  General surgery   Procedures:  Port exchange 5/18   Antimicrobials:   None    Subjective: Patient seen and evaluated today with no new acute complaints or concerns. No acute concerns or events noted overnight.  Continues to have mild gluteal pain noted.  Objective: Vitals:   10/01/21 2259 10/02/21 0500 10/02/21 0751 10/02/21 0937  BP:    (!) 117/58  Pulse:    73  Resp:    11  Temp: 98.3 F (36.8 C)  98 F (36.7 C) 98 F (36.7 C)  TempSrc: Oral  Oral Oral  SpO2:    97%  Weight:  58.3 kg    Height:        Intake/Output Summary (Last 24 hours) at 10/02/2021 1018 Last data filed at 10/02/2021 1010 Gross per 24 hour  Intake 1402.17 ml  Output 650 ml  Net 752.17 ml   Filed Weights   09/29/21 1429 09/30/21 0423 10/02/21 0500  Weight: 56.7 kg 60.1 kg 58.3 kg    Examination:  General exam: Appears calm and comfortable  Respiratory system: Clear to auscultation. Respiratory effort normal. Cardiovascular system: S1 & S2 heard, RRR.  Gastrointestinal system: Abdomen is soft Central nervous system: Alert and awake Extremities: No edema Skin: No significant lesions noted Psychiatry: Flat affect.    Data Reviewed: I have personally reviewed following labs and imaging  studies  CBC: Recent Labs  Lab 09/25/21 1214 09/26/21 0348 09/29/21 0959 09/29/21 1722 09/30/21 0450 09/30/21 1744 10/01/21 0430 10/02/21 0519  WBC 25.6*   < > 17.9* 18.6* 16.2* 17.6* 14.1* 14.6*  NEUTROABS 13.6*  --  8.8* 9.4*  --   --   --   --   HGB 6.2*   < > 6.9* 7.3* 5.6* 10.5* 9.1* 8.7*  HCT 19.8*   < > 21.7* 22.4* 17.8* 32.5* 28.4* 28.4*  MCV 88.0   < > 90.8 90.3 90.4 84.9 85.3 86.6  PLT 41*   < > 32* 27* 60* 51* 41* 61*   < > = values in this interval not displayed.   Basic Metabolic Panel: Recent Labs  Lab 09/26/21 0348 09/29/21 0959 09/30/21 0450 10/01/21 0430 10/02/21 0519  NA 137 136 137 136 135  K 3.3* 3.9 3.7 3.6 3.8  CL 114* 111 114* 113* 112*  CO2 19* 20* 20* 21* 20*  GLUCOSE 90 92 103* 91 81  BUN 35* 38* 36* 30* 26*  CREATININE 2.14* 2.33*  2.06* 1.83* 1.74*  CALCIUM 8.3* 8.6* 7.8* 7.6* 8.0*  MG 1.9  --   --   --   --    GFR: Estimated Creatinine Clearance: 23.3 mL/min (A) (by C-G formula based on SCr of 1.74 mg/dL (H)). Liver Function Tests: Recent Labs  Lab 09/25/21 1214 09/26/21 0348 09/29/21 0959 09/30/21 0450  AST '26 21 22 18  '$ ALT '15 13 13 13  '$ ALKPHOS 68 60 63 52  BILITOT 1.3* 1.3* 1.4* 0.8  PROT 6.9 6.0* 6.6 5.4*  ALBUMIN 3.3* 2.8* 3.0* 2.6*   No results for input(s): LIPASE, AMYLASE in the last 168 hours. No results for input(s): AMMONIA in the last 168 hours. Coagulation Profile: Recent Labs  Lab 09/29/21 0959 09/30/21 0450  INR 1.3* 1.3*   Cardiac Enzymes: No results for input(s): CKTOTAL, CKMB, CKMBINDEX, TROPONINI in the last 168 hours. BNP (last 3 results) No results for input(s): PROBNP in the last 8760 hours. HbA1C: No results for input(s): HGBA1C in the last 72 hours. CBG: No results for input(s): GLUCAP in the last 168 hours. Lipid Profile: No results for input(s): CHOL, HDL, LDLCALC, TRIG, CHOLHDL, LDLDIRECT in the last 72 hours. Thyroid Function Tests: No results for input(s): TSH, T4TOTAL, FREET4, T3FREE,  THYROIDAB in the last 72 hours. Anemia Panel: No results for input(s): VITAMINB12, FOLATE, FERRITIN, TIBC, IRON, RETICCTPCT in the last 72 hours. Sepsis Labs: Recent Labs  Lab 09/25/21 1214 09/25/21 1410  LATICACIDVEN 2.0* 2.0*    Recent Results (from the past 240 hour(s))  Culture, blood (Routine X 2) w Reflex to ID Panel     Status: None   Collection Time: 09/25/21 12:14 PM   Specimen: BLOOD LEFT ARM  Result Value Ref Range Status   Specimen Description BLOOD LEFT ARM  Final   Special Requests   Final    BOTTLES DRAWN AEROBIC AND ANAEROBIC Blood Culture adequate volume   Culture   Final    NO GROWTH 5 DAYS Performed at [facility], [address], [place], [place] [address]    Report Status 09/30/2021 FINAL  Final  Culture, blood (Routine X 2) w Reflex to ID Panel     Status: None   Collection Time: 09/25/21 12:14 PM   Specimen: BLOOD LEFT HAND  Result Value Ref Range Status   Specimen Description BLOOD LEFT HAND  Final   Special Requests   Final    BOTTLES DRAWN AEROBIC AND ANAEROBIC Blood Culture results may not be optimal due to an inadequate volume of blood received in culture bottles   Culture   Final    NO GROWTH 5 DAYS Performed at [facility], [address], [place], [place] [address]    Report Status 09/30/2021 FINAL  Final  MRSA Next Gen by PCR, Nasal     Status: None   Collection Time: 09/25/21  3:00 PM   Specimen: Nasal Mucosa; Nasal Swab  Result Value Ref Range Status   MRSA by PCR Next Gen NOT DETECTED NOT DETECTED Final    Comment: (NOTE) The GeneXpert MRSA Assay (FDA approved for NASAL specimens only), is one component of a comprehensive MRSA colonization surveillance program. It is not intended to diagnose MRSA infection nor to guide or monitor treatment for MRSA infections. Test performance is not FDA approved in patients less than 55 years old. Performed at [facility], [address], [place],  [address]          Radiology  Studies: No results found.  Scheduled Meds:  sodium chloride   Intravenous Once   [MAR Hold] Chlorhexidine Gluconate Cloth  6 each Topical Daily   fentaNYL       [MAR Hold] midodrine  5 mg Oral TID WC   [MAR Hold] predniSONE  5 mg Oral Q breakfast   [MAR Hold] umeclidinium-vilanterol  1 puff Inhalation Daily   Continuous Infusions:  sodium chloride Stopped (10/02/21 0918)    ceFAZolin (ANCEF) IV     lactated ringers 50 mL/hr at 10/02/21 1006     LOS: 3 days    Time spent: 35 minutes    Scottlyn Mchaney D Manuella Ghazi, DO [facility]  If 7PM-7AM, please contact night-coverage [email or web address] 10/02/2021, 10:18 AM

## 2021-10-02 NOTE — TOC Progression Note (Signed)
Transition of Care [facility] - Progression Note    Patient Details  Name: [name] MRN: [id number] Date of Birth: 05/12/1930  Transition of Care [facility]) CM/SW Contact  Boneta Lucks, RN Phone Number: 10/02/2021, 1:54 PM  Clinical Narrative:   Bed offers discussed with Daughter, Stanton Kidney. She accepted the offer at [facility]. TOC updated Elyse Hsu with Discharge plan for tomorrow if medically ready.    Expected Discharge Plan: Skilled Nursing Facility Barriers to Discharge: Continued Medical Work up  Expected Discharge Plan and Services Expected Discharge Plan: [place]

## 2021-10-02 NOTE — Op Note (Signed)
Operative Note 10/02/21   Preoperative Diagnosis: Myelodysplasic disorder   Postoperative Diagnosis: Same   Procedure(s) Performed: Port-A-Cath placement, right internal jugular   Surgeon: Lanell Matar. Constance Haw, MD   Assistants: No qualified resident was available   Anesthesia: Monitored anesthesia care   Anesthesiologist: Dr. Charna Elizabeth    Specimens: None   Estimated Blood Loss: Minimal   Fluoroscopy time: 7 seconds   Blood Replacement: None    Complications: None    Operative Findings: Normal anatomy  Indications: [name] is a patient with myelodysplastic disorder requiring multiple transfusions and need for port. He had a previous port that was removed due to concern for infection. He has been admitted multiple times recently requiring transfusion. We discussed port replacement and planned platelet administration. We discussed risk of bleeding, infection, malfunction, pneumothorax, injury to vessels, and he opted to proceed.   Procedure: The patient was brought into the operating room and monitored anesthesia care was induced.  One percent lidocaine was used for local anesthesia.  Platelets were running.  The right chest and neck was prepped and draped in the usual sterile fashion.  Preoperative antibiotics were given.  An ultrasound was used to access the jugular vein after local. A guidewire was then advanced into the right atrium under fluoroscopic guidance.  Ectopia was not noted. An incision was made below the right clavicle. A subcutaneous pocket was formed. The catheter was tunneled to the right neck. An introducer and peel-away sheath were placed over the guidewire. The catheter was then inserted through the peel-away sheath and the peel-away sheath was removed.  A spot film was performed to confirm the position. The catheter was then attached to the port and the port placed in subcutaneous pocket. Adequate positioning was confirmed by fluoroscopy. Hemostasis was confirmed,  and the port was secured with 2-0 prolene sutures.  Good backflow of blood was noted on aspiration of the port. The port was flushed with heparin flush. Subcutaneous layer was reapproximated using a 3-0 Vicryl interrupted suture. The skin was closed using a 4-0 Vicryl subcuticular suture. Dermabond was applied.    All tape and needle counts were correct at the end of the procedure. The patient was transferred to PACU in stable condition. A chest x-ray will be performed at that time.  [name], MD [facility] [address] [place], [place] [id number] [phone] (office)

## 2021-10-02 NOTE — Anesthesia Postprocedure Evaluation (Signed)
Anesthesia Post Note  Patient: CAN LUCCI  Procedure(s) Performed: INSERTION PORT-A-CATH (Right: Chest)  Patient location during evaluation: Phase II Anesthesia Type: General Level of consciousness: awake and alert and oriented Pain management: pain level controlled Vital Signs Assessment: post-procedure vital signs reviewed and stable Respiratory status: spontaneous breathing, nonlabored ventilation and respiratory function stable Cardiovascular status: blood pressure returned to baseline and stable Postop Assessment: no apparent nausea or vomiting Anesthetic complications: no   No notable events documented.   Last Vitals:  Vitals:   10/02/21 1213 10/02/21 1400  BP: (!) 134/54   Pulse: 67 73  Resp: 15 16  Temp:    SpO2: 95% 98%    Last Pain:  Vitals:   10/02/21 1120  TempSrc:   PainSc: 5                  Augustus Zurawski C Tatisha Cerino

## 2021-10-02 NOTE — H&P (Signed)
[name] History and Physical  Reason for Referral: Thrombocytopenia, myelodysplastic disorder, venous access needed   Chief Complaint   Weakness     [name] is a 88 y.o. male.  HPI: [name] is known to me after port placement on the left and subsequent removal due to concern for infection. We had healed his prior port site. We discussed placement of a new port and Dr. Delton Coombes is on board. Discussed doing it this admission since he is here and getting platelets and blood already after a fall. He is ready to proceed.   Past Medical History:  Diagnosis Date   Bone cancer ([place])    Chronic kidney insufficiency    COPD (chronic obstructive pulmonary disease) (HCC)    GERD (gastroesophageal reflux disease)    Pulmonary nodule     Past Surgical History:  Procedure Laterality Date   BACK SURGERY     BIOPSY  07/15/2021   Procedure: BIOPSY;  Surgeon: Harvel Quale, MD;  [facility];  Service: Gastroenterology;;   CATARACT EXTRACTION W/PHACO Right 03/09/2016   Procedure: CATARACT EXTRACTION PHACO AND INTRAOCULAR LENS PLACEMENT RIGHT EYE CDE=8.58;  Surgeon: Tonny Branch, MD;  Location: [facility];  Service: Ophthalmology;  Laterality: Right;  right   CATARACT EXTRACTION W/PHACO Left 04/13/2016   Procedure: CATARACT EXTRACTION PHACO AND INTRAOCULAR LENS PLACEMENT (IOC);  Surgeon: Tonny Branch, MD;  Location: [facility];  Service: Ophthalmology;  Laterality: Left;  CDE: 8.08   ENTEROSCOPY  07/23/2021   Procedure: ENTEROSCOPY;  Surgeon: Harvel Quale, MD;  [facility];  Service: Gastroenterology;;   ESOPHAGOGASTRODUODENOSCOPY (EGD) WITH PROPOFOL N/A 07/14/2021   Procedure: ESOPHAGOGASTRODUODENOSCOPY (EGD) WITH PROPOFOL;  Surgeon: Eloise Harman, DO;  [facility];  Service: Endoscopy;  Laterality: N/A;   ESOPHAGOGASTRODUODENOSCOPY (EGD) WITH PROPOFOL N/A 07/15/2021   Procedure: ESOPHAGOGASTRODUODENOSCOPY (EGD) WITH  PROPOFOL;  Surgeon: Harvel Quale, MD;  [facility];  Service: Gastroenterology;  Laterality: N/A;   ESOPHAGOGASTRODUODENOSCOPY (EGD) WITH PROPOFOL N/A 07/23/2021   Procedure: ESOPHAGOGASTRODUODENOSCOPY (EGD) WITH PROPOFOL;  Surgeon: Harvel Quale, MD;  [facility];  Service: Gastroenterology;  Laterality: N/A;   HEMOSTASIS CLIP PLACEMENT  07/15/2021   Procedure: HEMOSTASIS CLIP PLACEMENT;  Surgeon: Harvel Quale, MD;  [facility];  Service: Gastroenterology;;   HEMOSTASIS CLIP PLACEMENT  07/23/2021   Procedure: HEMOSTASIS CLIP PLACEMENT;  Surgeon: Harvel Quale, MD;  [facility];  Service: Gastroenterology;;   KYPHOPLASTY Bilateral 06/09/2019   Procedure: T11 KYPHOPLASTY;  Surgeon: Consuella Lose, MD;  Location: [facility];  Service: Neurosurgery;  Laterality: Bilateral;   None to Date  10/01/15   [facility] REMOVAL Left 08/07/2021   Procedure: MINOR REMOVAL PORT-A-CATH;  Surgeon: Virl Cagey, MD;  Location: [facility];  Service: General;  Laterality: Left;  lidocaine 1%   PORTACATH PLACEMENT Left 10/11/2020   Procedure: INSERTION PORT-A-CATH (attached catheter in left internal jugular);  Surgeon: Virl Cagey, MD;  Location: [facility];  Service: General;  Laterality: Left;    Family History  Problem Relation Age of Onset   Kidney disease Mother    Kidney disease Sister    Colon cancer Neg Hx    Gastric cancer Neg Hx    Esophageal cancer Neg Hx     Social History   Tobacco Use   Smoking status: Former    Years: 15.00    Types: Cigarettes, Cigars    Quit date: 10/01/1990  Years since quitting: 31.0   Smokeless tobacco: Never   Tobacco comments:    Quit x 25-30 years; 2 cigars daily when smoked  Vaping Use   Vaping Use: Never used  Substance Use Topics   Alcohol use: Not Currently    Alcohol/week: 0.0 standard drinks   Drug use: No    Medications: I have reviewed the patient's  current medications. Prior to Admission:  Medications Prior to Admission  Medication Sig Dispense Refill Last Dose   albuterol (VENTOLIN HFA) 108 (90 Base) MCG/ACT inhaler Inhale 2 puffs into the lungs every 4 (four) hours as needed for wheezing or shortness of breath. 18 g 2 09/28/2021   midodrine (PROAMATINE) 5 MG tablet Take 1 tablet (5 mg total) by mouth 3 (three) times daily with meals. 90 tablet 0 09/28/2021   predniSONE (DELTASONE) 5 MG tablet Take 5 mg by mouth daily with breakfast.   09/28/2021   silodosin (RAPAFLO) 4 MG CAPS capsule Take 4 mg by mouth at bedtime.   09/28/2021   umeclidinium-vilanterol (ANORO ELLIPTA) 62.5-25 MCG/ACT AEPB Inhale 1 puff into the lungs daily. (Patient not taking: Reported on 09/29/2021) 1 each 11 Not Taking   Scheduled:  sodium chloride   Intravenous Once   chlorhexidine  15 mL Mouth/Throat Once   Or   mouth rinse  15 mL Mouth Rinse Once   [MAR Hold] Chlorhexidine Gluconate Cloth  6 each Topical Daily   [MAR Hold] midodrine  5 mg Oral TID WC   [MAR Hold] predniSONE  5 mg Oral Q breakfast   [MAR Hold] umeclidinium-vilanterol  1 puff Inhalation Daily   Continuous:  sodium chloride 75 mL/hr at 10/01/21 2151    ceFAZolin (ANCEF) IV     lactated ringers     PRN:[MAR Hold] acetaminophen **OR** [MAR Hold] acetaminophen, [MAR Hold] albuterol, fentaNYL (SUBLIMAZE) injection, [MAR Hold] ondansetron **OR** [MAR Hold] ondansetron (ZOFRAN) IV, [MAR Hold] oxyCODONE, [MAR Hold] traMADol  No Known Allergies    ROS:  A comprehensive review of systems was negative except for: Constitutional: positive for weakness Hematologic/lymphatic: positive for bleeding and easy bruising Musculoskeletal: positive for back pain and hip pain from hematoma/ fall   Blood pressure (!) 117/58, pulse 73, temperature 98 F (36.7 C), temperature source Oral, resp. rate 11, height '6\' 4"'$  (1.93 m), weight 58.3 kg, SpO2 97 %. Physical Exam Vitals reviewed.  Constitutional:       Appearance: He is underweight.  HENT:     Head: Normocephalic.     Nose: Nose normal.  Eyes:     Extraocular Movements: Extraocular movements intact.  Cardiovascular:     Rate and Rhythm: Normal rate.  Pulmonary:     Effort: Pulmonary effort is normal.  Abdominal:     General: There is no distension.     Palpations: Abdomen is soft.  Musculoskeletal:        General: Normal range of motion.     Comments: Left hip with hematoma and swelling/ evolving ecchymosis   Skin:    General: Skin is warm.  Neurological:     General: No focal deficit present.     Mental Status: He is alert and oriented to person, place, and time.  Psychiatric:        Mood and Affect: Mood normal.    Results: Results for orders placed or performed during the hospital encounter of 09/29/21 (from the past 48 hour(s))  CBC     Status: Abnormal   Collection Time: 09/30/21  5:44 PM  Result Value Ref Range   WBC 17.6 (H) 4.0 - 10.5 K/uL   RBC 3.83 (L) 4.22 - 5.81 MIL/uL   Hemoglobin 10.5 (L) 13.0 - 17.0 g/dL    Comment: REPEATED TO VERIFY POST TRANSFUSION SPECIMEN    HCT 32.5 (L) 39.0 - 52.0 %   MCV 84.9 80.0 - 100.0 fL   MCH 27.4 26.0 - 34.0 pg   MCHC 32.3 30.0 - 36.0 g/dL   RDW 19.3 (H) 11.5 - 15.5 %   Platelets 51 (L) 150 - 400 K/uL    Comment: SPECIMEN CHECKED FOR CLOTS Immature Platelet Fraction may be clinically indicated, consider ordering this additional test [id number] CONSISTENT WITH PREVIOUS RESULT    nRBC 1.4 (H) 0.0 - 0.2 %    Comment: Performed at [facility], [address], [place], [place] [address]  CBC     Status: Abnormal   Collection Time: 10/01/21  4:30 AM  Result Value Ref Range   WBC 14.1 (H) 4.0 - 10.5 K/uL   RBC 3.33 (L) 4.22 - 5.81 MIL/uL   Hemoglobin 9.1 (L) 13.0 - 17.0 g/dL   HCT 28.4 (L) 39.0 - 52.0 %   MCV 85.3 80.0 - 100.0 fL   MCH 27.3 26.0 - 34.0 pg   MCHC 32.0 30.0 - 36.0 g/dL   RDW 19.7 (H) 11.5 - 15.5 %   Platelets 41 (L) 150 - 400 K/uL    Comment: SPECIMEN  CHECKED FOR CLOTS Immature Platelet Fraction may be clinically indicated, consider ordering this additional test [id number] CONSISTENT WITH PREVIOUS RESULT    nRBC 1.4 (H) 0.0 - 0.2 %    Comment: Performed at [facility], [address] [place], [place] [address]  Basic metabolic panel     Status: Abnormal   Collection Time: 10/01/21  4:30 AM  Result Value Ref Range   Sodium 136 135 - 145 mmol/L   Potassium 3.6 3.5 - 5.1 mmol/L   Chloride 113 (H) 98 - 111 mmol/L   CO2 21 (L) 22 - 32 mmol/L   Glucose, Bld 91 70 - 99 mg/dL    Comment: Glucose reference range applies only to samples taken after fasting for at least 8 hours.   BUN 30 (H) 8 - 23 mg/dL   Creatinine, Ser 1.83 (H) 0.61 - 1.24 mg/dL   Calcium 7.6 (L) 8.9 - 10.3 mg/dL   GFR, Estimated 35 (L) >60 mL/min    Comment: (NOTE) Calculated using the CKD-EPI Creatinine Equation (2021)    Anion gap 2 (L) 5 - 15    Comment: Performed at [facility], [address], [place], [place] [address]  Prepare platelet pheresis     Status: None (Preliminary result)   Collection Time: 10/01/21  4:30 AM  Result Value Ref Range   Unit Number [id number]    Blood Component Type PLTP2 PSORALEN TREATED    Unit division 00    Status of Unit ALLOCATED    Transfusion Status OK TO TRANSFUSE   CBC     Status: Abnormal   Collection Time: 10/02/21  5:19 AM  Result Value Ref Range   WBC 14.6 (H) 4.0 - 10.5 K/uL   RBC 3.28 (L) 4.22 - 5.81 MIL/uL   Hemoglobin 8.7 (L) 13.0 - 17.0 g/dL   HCT 28.4 (L) 39.0 - 52.0 %   MCV 86.6 80.0 - 100.0 fL   MCH 26.5 26.0 - 34.0 pg   MCHC 30.6 30.0 - 36.0 g/dL   RDW 19.8 (H) 11.5 -  15.5 %   Platelets 61 (L) 150 - 400 K/uL    Comment: SPECIMEN CHECKED FOR CLOTS Immature Platelet Fraction may be clinically indicated, consider ordering this additional test [id number] CONSISTENT WITH PREVIOUS RESULT    nRBC 1.4 (H) 0.0 - 0.2 %    Comment: Performed at [facility], [address] [place], [place] [address]  Basic  metabolic panel     Status: Abnormal   Collection Time: 10/02/21  5:19 AM  Result Value Ref Range   Sodium 135 135 - 145 mmol/L   Potassium 3.8 3.5 - 5.1 mmol/L   Chloride 112 (H) 98 - 111 mmol/L   CO2 20 (L) 22 - 32 mmol/L   Glucose, Bld 81 70 - 99 mg/dL    Comment: Glucose reference range applies only to samples taken after fasting for at least 8 hours.   BUN 26 (H) 8 - 23 mg/dL   Creatinine, Ser 1.74 (H) 0.61 - 1.24 mg/dL   Calcium 8.0 (L) 8.9 - 10.3 mg/dL   GFR, Estimated 37 (L) >60 mL/min    Comment: (NOTE) Calculated using the CKD-EPI Creatinine Equation (2021)    Anion gap 3 (L) 5 - 15    Comment: Performed at [facility], [address], [place], [place] [address]    Assessment & Plan:  [name] is a 88 y.o. male with myelodysplastic disorder requiring multiple transfusions and blood draws. His prior port had to be removed due to concern for infection. Needs a replacement. Will get platelets at the time of placement.  -Discussed risk of bleeding, infection, pneumothorax, injury to vessels.  -R IJ will be accessed under ultrasound guidance.   All questions were answered to the satisfaction of the patient.   Virl Cagey 10/02/2021, 10:04 AM

## 2021-10-02 NOTE — Anesthesia Preprocedure Evaluation (Addendum)
Anesthesia Evaluation  Patient identified by MRN, date of birth, ID band Patient awake    Reviewed: Allergy & Precautions, NPO status , Patient's Chart, lab work & pertinent test results  Airway Mallampati: II  TM Distance: <3 FB Neck ROM: Full    Dental  (+) Dental Advisory Given, Partial Lower, Upper Dentures   Pulmonary pneumonia, COPD,  COPD inhaler and oxygen dependent, former smoker,    Pulmonary exam normal breath sounds clear to auscultation       Cardiovascular Exercise Tolerance: Poor Normal cardiovascular exam Rhythm:Regular Rate:Normal     Neuro/Psych PSYCHIATRIC DISORDERS Depression negative neurological ROS  negative psych ROS   GI/Hepatic Neg liver ROS, GERD  Medicated,  Endo/Other  negative endocrine ROS  Renal/GU Renal InsufficiencyRenal disease  negative genitourinary   Musculoskeletal  (+) Arthritis , Osteoarthritis,    Abdominal   Peds negative pediatric ROS (+)  Hematology  (+) Blood dyscrasia (myelodysplastic syndrome, thrombocytopenia - 34), anemia ,   Anesthesia Other Findings Low back pain  Reproductive/Obstetrics negative OB ROS                             Anesthesia Physical  Anesthesia Plan  ASA: 4  Anesthesia Plan: General   Post-op Pain Management:    Induction: Intravenous  PONV Risk Score and Plan: Ondansetron and Propofol infusion  Airway Management Planned: Nasal Cannula and Natural Airway  Additional Equipment:   Intra-op Plan:   Post-operative Plan:   Informed Consent: I have reviewed the patients History and Physical, chart, labs and discussed the procedure including the risks, benefits and alternatives for the proposed anesthesia with the patient or authorized representative who has indicated his/her understanding and acceptance.    Discussed DNR with patient and Suspend DNR.   Dental advisory given  Plan Discussed with: CRNA and  Surgeon  Anesthesia Plan Comments:        Anesthesia Quick Evaluation

## 2021-10-03 ENCOUNTER — Encounter (HOSPITAL_COMMUNITY): Payer: Self-pay | Admitting: General Surgery

## 2021-10-03 DIAGNOSIS — D469 Myelodysplastic syndrome, unspecified: Secondary | ICD-10-CM | POA: Diagnosis not present

## 2021-10-03 DIAGNOSIS — I9589 Other hypotension: Secondary | ICD-10-CM | POA: Diagnosis not present

## 2021-10-03 DIAGNOSIS — D649 Anemia, unspecified: Secondary | ICD-10-CM | POA: Diagnosis not present

## 2021-10-03 DIAGNOSIS — M6281 Muscle weakness (generalized): Secondary | ICD-10-CM | POA: Diagnosis not present

## 2021-10-03 DIAGNOSIS — D62 Acute posthemorrhagic anemia: Secondary | ICD-10-CM | POA: Diagnosis not present

## 2021-10-03 DIAGNOSIS — S7002XD Contusion of left hip, subsequent encounter: Secondary | ICD-10-CM | POA: Diagnosis not present

## 2021-10-03 DIAGNOSIS — M79652 Pain in left thigh: Secondary | ICD-10-CM | POA: Diagnosis not present

## 2021-10-03 DIAGNOSIS — G8929 Other chronic pain: Secondary | ICD-10-CM | POA: Diagnosis not present

## 2021-10-03 DIAGNOSIS — Z681 Body mass index (BMI) 19 or less, adult: Secondary | ICD-10-CM | POA: Diagnosis not present

## 2021-10-03 DIAGNOSIS — J9621 Acute and chronic respiratory failure with hypoxia: Secondary | ICD-10-CM | POA: Diagnosis not present

## 2021-10-03 DIAGNOSIS — J439 Emphysema, unspecified: Secondary | ICD-10-CM | POA: Diagnosis not present

## 2021-10-03 DIAGNOSIS — Z79891 Long term (current) use of opiate analgesic: Secondary | ICD-10-CM | POA: Diagnosis not present

## 2021-10-03 DIAGNOSIS — E440 Moderate protein-calorie malnutrition: Secondary | ICD-10-CM | POA: Diagnosis not present

## 2021-10-03 DIAGNOSIS — K922 Gastrointestinal hemorrhage, unspecified: Secondary | ICD-10-CM | POA: Diagnosis not present

## 2021-10-03 DIAGNOSIS — M25552 Pain in left hip: Secondary | ICD-10-CM | POA: Diagnosis not present

## 2021-10-03 DIAGNOSIS — N183 Chronic kidney disease, stage 3 unspecified: Secondary | ICD-10-CM | POA: Diagnosis not present

## 2021-10-03 DIAGNOSIS — Z79899 Other long term (current) drug therapy: Secondary | ICD-10-CM | POA: Diagnosis not present

## 2021-10-03 DIAGNOSIS — R279 Unspecified lack of coordination: Secondary | ICD-10-CM | POA: Diagnosis not present

## 2021-10-03 DIAGNOSIS — W19XXXD Unspecified fall, subsequent encounter: Secondary | ICD-10-CM | POA: Diagnosis not present

## 2021-10-03 DIAGNOSIS — Z7401 Bed confinement status: Secondary | ICD-10-CM | POA: Diagnosis not present

## 2021-10-03 DIAGNOSIS — D696 Thrombocytopenia, unspecified: Secondary | ICD-10-CM | POA: Diagnosis not present

## 2021-10-03 DIAGNOSIS — R52 Pain, unspecified: Secondary | ICD-10-CM | POA: Diagnosis not present

## 2021-10-03 DIAGNOSIS — W19XXXA Unspecified fall, initial encounter: Secondary | ICD-10-CM | POA: Diagnosis not present

## 2021-10-03 DIAGNOSIS — I959 Hypotension, unspecified: Secondary | ICD-10-CM | POA: Diagnosis not present

## 2021-10-03 DIAGNOSIS — N401 Enlarged prostate with lower urinary tract symptoms: Secondary | ICD-10-CM | POA: Diagnosis not present

## 2021-10-03 LAB — CBC
HCT: 26 % — ABNORMAL LOW (ref 39.0–52.0)
Hemoglobin: 8.1 g/dL — ABNORMAL LOW (ref 13.0–17.0)
MCH: 26.8 pg (ref 26.0–34.0)
MCHC: 31.2 g/dL (ref 30.0–36.0)
MCV: 86.1 fL (ref 80.0–100.0)
Platelets: 56 10*3/uL — ABNORMAL LOW (ref 150–400)
RBC: 3.02 MIL/uL — ABNORMAL LOW (ref 4.22–5.81)
RDW: 19.6 % — ABNORMAL HIGH (ref 11.5–15.5)
WBC: 17.7 10*3/uL — ABNORMAL HIGH (ref 4.0–10.5)
nRBC: 0.8 % — ABNORMAL HIGH (ref 0.0–0.2)

## 2021-10-03 LAB — BPAM PLATELET PHERESIS
Blood Product Expiration Date: 202305192359
ISSUE DATE / TIME: 202305181006
Unit Type and Rh: 5100

## 2021-10-03 LAB — PREPARE PLATELET PHERESIS: Unit division: 0

## 2021-10-03 LAB — BASIC METABOLIC PANEL
Anion gap: 5 (ref 5–15)
BUN: 24 mg/dL — ABNORMAL HIGH (ref 8–23)
CO2: 22 mmol/L (ref 22–32)
Calcium: 8.5 mg/dL — ABNORMAL LOW (ref 8.9–10.3)
Chloride: 110 mmol/L (ref 98–111)
Creatinine, Ser: 1.78 mg/dL — ABNORMAL HIGH (ref 0.61–1.24)
GFR, Estimated: 36 mL/min — ABNORMAL LOW (ref >60)
Glucose, Bld: 87 mg/dL (ref 70–99)
Potassium: 3.9 mmol/L (ref 3.5–5.1)
Sodium: 137 mmol/L (ref 135–145)

## 2021-10-03 MED ORDER — OXYCODONE HCL 5 MG PO TABS: 5.0000 mg | ORAL_TABLET | Freq: Four times a day (QID) | ORAL | 0 refills | Status: DC | PRN

## 2021-10-03 MED ORDER — PHENOL 1.4 % MT LIQD: 1.0000 | OROMUCOSAL | Status: DC | PRN

## 2021-10-03 MED ORDER — HYDROMORPHONE HCL 1 MG/ML IJ SOLN
0.5000 mg | INTRAMUSCULAR | Status: DC | PRN
  Administered 2021-10-03: 0.5 mg via INTRAVENOUS
  Filled 2021-10-03: qty 0.5

## 2021-10-03 MED ORDER — TRAMADOL HCL 50 MG PO TABS: 50.0000 mg | ORAL_TABLET | Freq: Three times a day (TID) | ORAL | 0 refills | Status: DC | PRN

## 2021-10-03 MED ORDER — SODIUM CHLORIDE 0.9% IV SOLUTION: Freq: Once | INTRAVENOUS | Status: AC

## 2021-10-03 NOTE — Discharge Summary (Signed)
Physician Discharge Summary  [name] ZOX:[id number] DOB: 1930/02/06 DOA: 09/29/2021  PCP: Sharilyn Sites, MD  Admit date: 09/29/2021  Discharge date: 10/03/2021  Admitted From:Home  Disposition:  SNF  Recommendations for Outpatient Follow-up:  Follow up with Dr. Delton Coombes 5/22 for repeat CBC monitoring and possible need for additional transfusions Follow-up with Dr. Constance Haw outpatient to reevaluate port as scheduled Continue pain medications with tramadol and oxycodone as prescribed with 10 tablets and 0 refills each Continue other prior medications as noted  Home Health: None  Equipment/Devices: None  Discharge Condition:Stable  CODE STATUS: DNR  Diet recommendation: Heart Healthy  Brief/Interim Summary: [name] is a 88 y.o. male with medical history significant of MDS, anemia/thrombocytopenia, chronic kidney disease stage IIIb, steroid-dependent COPD, chronic hypotension on midodrine, presents to the emergency room with fatigue after a fall.  Patient was recently admitted to the hospital from 5/11 to 5/13, after he suffered a fall and developed a significant gluteal hematoma resulting in blood loss anemia.  He has been transfused multiple units of PRBCs as well as platelets to keep hemoglobin greater than 8 and platelets greater than 50,000.  His care was guided by remote consultation with his hematologist Dr. Delton Coombes during his stay.  He continues to have significant amounts of pain, but this is stabilizing.  He was seen by PT as he was having difficulty with ambulation and weightbearing on that leg.  They have recommended skilled facility placement at this time and he will discharge to facility today.  He was needing a change in his port site and this was performed by general surgery on 5/18 with no acute events or concerns noted.  Discharge Diagnoses:  Principal Problem:   Acute blood loss anemia Active Problems:   Myelodysplastic syndrome (HCC)    Thrombocytopenia (HCC)   CKD (chronic kidney disease), stage IIIB   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   Body mass index (BMI) 19.9 or less, adult   Chronic hypotension   Fall at home, initial encounter   Traumatic hematoma of left hip  Principal discharge diagnosis: Acute on chronic blood loss anemia along with thrombocytopenia in the setting of gluteal hematoma after recent fall.  Myelodysplastic syndrome with need for recurrent transfusions.  Discharge Instructions  Discharge Instructions     Diet - low sodium heart healthy   Complete by: As directed    Increase activity slowly   Complete by: As directed    No wound care   Complete by: As directed       Allergies as of 10/03/2021   No Known Allergies      Medication List     TAKE these medications    albuterol 108 (90 Base) MCG/ACT inhaler Commonly known as: VENTOLIN HFA Inhale 2 puffs into the lungs every 4 (four) hours as needed for wheezing or shortness of breath.   Anoro Ellipta 62.5-25 MCG/ACT Aepb Generic drug: umeclidinium-vilanterol Inhale 1 puff into the lungs daily.   midodrine 5 MG tablet Commonly known as: PROAMATINE Take 1 tablet (5 mg total) by mouth 3 (three) times daily with meals.   oxyCODONE 5 MG immediate release tablet Commonly known as: Oxy IR/ROXICODONE Take 1 tablet (5 mg total) by mouth every 6 (six) hours as needed for severe pain.   predniSONE 5 MG tablet Commonly known as: DELTASONE Take 5 mg by mouth daily with breakfast.   silodosin 4 MG Caps capsule Commonly known as: RAPAFLO Take 4 mg by  mouth at bedtime.   traMADol 50 MG tablet Commonly known as: ULTRAM Take 1 tablet (50 mg total) by mouth every 8 (eight) hours as needed for moderate pain.        Contact information for follow-up providers     Virl Cagey, MD Follow up.   Specialty: General Surgery Why: As needed Contact  information: [address] [place] [address] [phone]         Derek Jack, MD. Go on 10/06/2021.   Specialty: Hematology Contact information: [address] [place] [address] [phone]              Contact information for after-discharge care     [facility] .   Service: Skilled Nursing Contact information: [address] [place] [phone]                    No Known Allergies  Consultations: General surgery Discussed case with hematology Dr. Delton Coombes   Procedures/Studies: CT ABDOMEN PELVIS WO CONTRAST  Result Date: 09/25/2021 CLINICAL DATA:  Weakness and anemia.  Fall on left side 09/22/2021 EXAM: CT ABDOMEN AND PELVIS WITHOUT CONTRAST TECHNIQUE: Multidetector CT imaging of the abdomen and pelvis was performed following the standard protocol without IV contrast. RADIATION DOSE REDUCTION: This exam was performed according to the departmental dose-optimization program which includes automated exposure control, adjustment of the mA and/or kV according to patient size and/or use of iterative reconstruction technique. COMPARISON:  CT 09/23/2016 FINDINGS: Lower chest: Basilar emphysema. Patchy consolidation within the left greater than right lower lobes suspicious for pneumonia, including aspiration. Trace left pleural effusion. Hepatobiliary: There is no evidence of focal hepatic abnormality on this unenhanced exam. Gallbladder physiologically distended, no calcified stone. No biliary dilatation. Pancreas: No ductal dilatation or inflammation. Normal for age pancreatic atrophy. Spleen: The spleen measures 12.1 x 7.7 x 9.8 cm (volume = 480 cm^3), mildly enlarged. No focal abnormality or perisplenic fluid on this unenhanced exam. Adrenals/Urinary Tract: No adrenal nodule. There is no hydronephrosis. There is left renal parenchymal thinning. Multiple  bilateral cysts of varying sizes in density, incompletely characterized on this unenhanced exam. These are grossly unchanged from 2018 exam, and no specific imaging follow-up is recommended. Unremarkable urinary bladder. Stomach/Bowel: There is ingested material distending the stomach. Occasional fluid-filled small bowel without obstruction or inflammation. Moderate volume of colonic stool. No colonic wall thickening or inflammatory change. Left colonic diverticulosis, prominent in the sigmoid, no diverticulitis. Vascular/Lymphatic: Aortic atherosclerosis. No aortic aneurysm. There is no periaortic stranding. No retroperitoneal fluid. No bulky abdominopelvic adenopathy. Reproductive: Prostatic calcifications. Other: There is soft tissue edema involving the greater than right flank subcutaneous tissues dependently, as well as midline skin thickening. Edematous changes extend over the left hip where there is also enlargement of the left gluteus muscles as described below. There is no free air or free fluid within the abdomen or pelvis. The right testis may be located in the inguinal canal. Musculoskeletal: Enlargement and heterogeneous left gluteal musculature may be related to hematoma in the setting of recent fall. There is overlying stranding of the left lateral hip and flank soft tissues. No evidence of retroperitoneal hematoma. The bones are diffusely under mineralized. Allowing for this limitation, no acute fracture of the pelvis, hip, or included ribs. There are chronic compression fractures of T11 and L2 with vertebral augmentation. Chronic compression deformity of L3, L4, and L5, not significantly changed from  06/05/2019 lumbar radiographs. IMPRESSION: 1. Enlargement and heterogeneous left gluteal musculature may be related to hematoma in the setting of recent fall. There is overlying stranding of the left lateral hip and flank soft tissues. No pelvis or hip fracture. 2. No evidence of retroperitoneal  hematoma. 3. Patchy consolidation within the left greater than right lower lobes suspicious for pneumonia, including aspiration. Trace left pleural effusion. 4. Colonic diverticulosis without diverticulitis. 5. Mild splenomegaly. 6. Multiple chronic compression fractures of the lower thoracic and lumbar spine. Aortic Atherosclerosis (ICD10-I70.0) and Emphysema (ICD10-J43.9). Electronically Signed   By: Keith Rake M.D.   On: 09/25/2021 19:17   CT Head Wo Contrast  Result Date: 09/29/2021 CLINICAL DATA:  Head trauma, minor (Age >= 65y) EXAM: CT HEAD WITHOUT CONTRAST TECHNIQUE: Contiguous axial images were obtained from the base of the skull through the vertex without intravenous contrast. RADIATION DOSE REDUCTION: This exam was performed according to the departmental dose-optimization program which includes automated exposure control, adjustment of the mA and/or kV according to patient size and/or use of iterative reconstruction technique. COMPARISON:  June 2022 FINDINGS: Brain: There is no acute intracranial hemorrhage, mass effect, or edema. Gray-white differentiation is preserved. There is no extra-axial fluid collection. Prominence of the ventricles and sulci reflects similar parenchymal volume loss. Patchy hypoattenuation in the supratentorial white matter is nonspecific but probably reflects similar chronic microvascular ischemic changes. Vascular: There is atherosclerotic calcification at the skull base. Skull: Calvarium is unremarkable. Sinuses/Orbits: No acute finding. Other: None. IMPRESSION: No evidence of acute intracranial injury. Electronically Signed   By: Macy Mis M.D.   On: 09/29/2021 12:04   CT Cervical Spine Wo Contrast  Result Date: 09/29/2021 CLINICAL DATA:  Neck trauma (Age >= 65y) EXAM: CT CERVICAL SPINE WITHOUT CONTRAST TECHNIQUE: Multidetector CT imaging of the cervical spine was performed without intravenous contrast. Multiplanar CT image reconstructions were also  generated. RADIATION DOSE REDUCTION: This exam was performed according to the departmental dose-optimization program which includes automated exposure control, adjustment of the mA and/or kV according to patient size and/or use of iterative reconstruction technique. COMPARISON:  None Available. FINDINGS: Alignment: Trace retrolisthesis at C4-C5. Skull base and vertebrae: Decreased osseous mineralization. Degenerative endplate irregularity. No acute fracture. Soft tissues and spinal canal: No prevertebral fluid or swelling. No visible canal hematoma. Disc levels: Multilevel degenerative changes are present including disc space narrowing, endplate osteophytes, and facet and uncovertebral hypertrophy. Upper chest: Dictated separately. Other: Mild calcified plaque at the common carotid bifurcations. IMPRESSION: No acute cervical spine fracture. Electronically Signed   By: Macy Mis M.D.   On: 09/29/2021 11:59   CT T-SPINE NO CHARGE  Result Date: 09/29/2021 CLINICAL DATA:  Fall EXAM: CT THORACIC AND LUMBAR SPINE WITHOUT CONTRAST TECHNIQUE: Multidetector CT imaging of the thoracic and lumbar spine was performed without contrast. Multiplanar CT image reconstructions were also generated. RADIATION DOSE REDUCTION: This exam was performed according to the departmental dose-optimization program which includes automated exposure control, adjustment of the mA and/or kV according to patient size and/or use of iterative reconstruction technique. COMPARISON:  CT abdomen pelvis 09/25/2021, CT chest 02/15/2021, CT L-spine 06/02/2019 FINDINGS: CT THORACIC SPINE FINDINGS Alignment: Mild S shaped curvature. Exaggeration of the normal thoracic kyphosis in part secondary to chronic compression deformities of T6-T8). No listhesis. Vertebrae: No acute fracture or suspicious osseous lesion. Unchanged compression deformities of T6, T7, T8, T9, and T11, with redemonstrated kyphoplasty in T11. Paraspinal and other soft tissues: Please  see same-day CT chest abdomen pelvis. Disc levels: No  high-grade spinal canal stenosis. Moderate bilateral neural foraminal narrowing at T10-T11 and T11-T12. CT LUMBAR SPINE FINDINGS Segmentation: 5 lumbar type vertebral bodies. Partial sacralization of L5 with bilateral L5-S1 pseudoarticulation of broadened left-greater-than-right L5 transverse processes with the sacral ala. Alignment: Mild S shaped curvature of the thoracolumbar spine. Straightening and mild reversal of the normal lumbar lordosis. No significant listhesis. Vertebrae: No acute fracture or suspicious osseous lesion. Redemonstrated compression deformities at L2, L3, L4, and L5, with redemonstrated kyphoplasty at L2. Paraspinal and other soft tissues: Please see same-day CT chest abdomen pelvis. Disc levels: Redemonstrated degenerative changes, with mild spinal canal stenosis at L2-L3 and L3-L4. No significant neural foraminal narrowing. IMPRESSION: No acute fracture or traumatic listhesis in the thoracic spine or lumbar spine. Unchanged multilevel compression deformities, status post kyphoplasty at T11 and T12. Please see same-day CT chest abdomen pelvis for soft tissue findings. Electronically Signed   By: Merilyn Baba M.D.   On: 09/29/2021 12:09   CT L-SPINE NO CHARGE  Result Date: 09/29/2021 CLINICAL DATA:  Fall EXAM: CT THORACIC AND LUMBAR SPINE WITHOUT CONTRAST TECHNIQUE: Multidetector CT imaging of the thoracic and lumbar spine was performed without contrast. Multiplanar CT image reconstructions were also generated. RADIATION DOSE REDUCTION: This exam was performed according to the departmental dose-optimization program which includes automated exposure control, adjustment of the mA and/or kV according to patient size and/or use of iterative reconstruction technique. COMPARISON:  CT abdomen pelvis 09/25/2021, CT chest 02/15/2021, CT L-spine 06/02/2019 FINDINGS: CT THORACIC SPINE FINDINGS Alignment: Mild S shaped curvature. Exaggeration of  the normal thoracic kyphosis in part secondary to chronic compression deformities of T6-T8). No listhesis. Vertebrae: No acute fracture or suspicious osseous lesion. Unchanged compression deformities of T6, T7, T8, T9, and T11, with redemonstrated kyphoplasty in T11. Paraspinal and other soft tissues: Please see same-day CT chest abdomen pelvis. Disc levels: No high-grade spinal canal stenosis. Moderate bilateral neural foraminal narrowing at T10-T11 and T11-T12. CT LUMBAR SPINE FINDINGS Segmentation: 5 lumbar type vertebral bodies. Partial sacralization of L5 with bilateral L5-S1 pseudoarticulation of broadened left-greater-than-right L5 transverse processes with the sacral ala. Alignment: Mild S shaped curvature of the thoracolumbar spine. Straightening and mild reversal of the normal lumbar lordosis. No significant listhesis. Vertebrae: No acute fracture or suspicious osseous lesion. Redemonstrated compression deformities at L2, L3, L4, and L5, with redemonstrated kyphoplasty at L2. Paraspinal and other soft tissues: Please see same-day CT chest abdomen pelvis. Disc levels: Redemonstrated degenerative changes, with mild spinal canal stenosis at L2-L3 and L3-L4. No significant neural foraminal narrowing. IMPRESSION: No acute fracture or traumatic listhesis in the thoracic spine or lumbar spine. Unchanged multilevel compression deformities, status post kyphoplasty at T11 and T12. Please see same-day CT chest abdomen pelvis for soft tissue findings. Electronically Signed   By: Merilyn Baba M.D.   On: 09/29/2021 12:09   DG Chest Port 1 View  Result Date: 10/02/2021 CLINICAL DATA:  Postop port placement EXAM: PORTABLE CHEST 1 VIEW COMPARISON:  Chest radiograph 09/25/2021 FINDINGS: There is a new right chest wall port in place with tip terminating in the mid SVC. The cardiomediastinal silhouette is stable. There is extensive coarsened interstitial markings throughout both lungs. There is a new small left pleural  effusion with adjacent atelectasis. There is no other new or worsening focal airspace disease. There is no significant right pleural effusion. There is no pneumothorax The bones are stable. IMPRESSION: 1. New right chest wall port in place with the tip terminating in the mid SVC. 2. New  small left pleural effusion with adjacent atelectasis or pneumonia. Electronically Signed   By: Valetta Mole M.D.   On: 10/02/2021 11:44   DG Chest Port 1 View  Result Date: 09/25/2021 CLINICAL DATA:  Shortness of breath EXAM: PORTABLE CHEST 1 VIEW COMPARISON:  Chest x-ray dated Sep 20, 2021 FINDINGS: Cardiac and mediastinal contours are unchanged. Bilateral reticular opacities, most prominent in the lung bases, similar prior exams and likely due to background emphysema and scarring. No new focal consolidation. Previously described right lung opacity is no longer visible. No large pleural effusion or pneumothorax. IMPRESSION: No new focal consolidation. Previously described right lung opacity is no longer visible, likely resolved atelectasis. Electronically Signed   By: Yetta Glassman M.D.   On: 09/25/2021 12:11   DG Chest Port 1 View  Result Date: 09/20/2021 CLINICAL DATA:  Weakness. EXAM: PORTABLE CHEST 1 VIEW COMPARISON:  08/29/2021 and prior studies FINDINGS: Cardiomediastinal silhouette is unchanged. Emphysema and chronic interstitial opacities are again identified. Focal opacity overlying the RIGHT mid lung is not significantly changed and may represent infection/pneumonia but radiographic follow-up to resolution is recommended. There is no evidence of pneumothorax or large pleural effusion. No acute bony abnormalities are identified. Vertebral augmentation changes are again noted. IMPRESSION: 1. Unchanged RIGHT mid lung opacity which may represent infection/pneumonia. Radiographic follow-up to resolution is recommended. 2. Emphysema and chronic interstitial opacities. Electronically Signed   By: Margarette Canada M.D.   On:  09/20/2021 11:48   DG C-Arm 1-60 Min-No Report  Result Date: 10/02/2021 Fluoroscopy was utilized by the requesting physician.  No radiographic interpretation.   DG Hip Unilat W or Wo Pelvis 2-3 Views Left  Result Date: 09/29/2021 CLINICAL DATA:  Fall. Tenderness along the left hip with bruising of the left posterior thigh. EXAM: DG HIP (WITH OR WITHOUT PELVIS) 2-3V LEFT COMPARISON:  CT pelvis 09/25/2021 FINDINGS: Bony demineralization. Chronic compression fractures at L4 and L5. No definite hip fracture identified. Dense prostate gland calcifications. There is irregularity of the right inferior pubic ramus suspicious for a fracture. Fracture versus accentuated trabeculation laterally in the right superior pubic ramus. IMPRESSION: 1. The left proximal femur appears intact, there is suspicion for potential fractures of the right (contralateral) inferior pubic ramus and possibly superior pubic ramus. This raises the possibility of other fractures in the bony pelvis which might be occult radiographically partially due to the patient's bony demineralization. Special attention to the bony pelvis is recommended on the pelvis CT scan ordered for today. 2. Chronic compression fractures at L4 and L5. Electronically Signed   By: Van Clines M.D.   On: 09/29/2021 10:24   CT CHEST ABDOMEN PELVIS WO CONTRAST  Result Date: 09/29/2021 CLINICAL DATA:  Dizziness, fall, initial encounter. EXAM: CT CHEST, ABDOMEN AND PELVIS WITHOUT CONTRAST TECHNIQUE: Multidetector CT imaging of the chest, abdomen and pelvis was performed following the standard protocol without IV contrast. RADIATION DOSE REDUCTION: This exam was performed according to the departmental dose-optimization program which includes automated exposure control, adjustment of the mA and/or kV according to patient size and/or use of iterative reconstruction technique. COMPARISON:  CT abdomen pelvis 09/25/2021 and CT chest 02/15/2021. FINDINGS: CT CHEST  FINDINGS Cardiovascular: Atherosclerotic calcification of the aorta, aortic valve and coronary arteries. There may be papillary muscle calcifications in the left ventricle. Decreased attenuation of the intravascular compartment is indicative of anemia. Heart size within normal limits. No pericardial effusion. Mediastinum/Nodes: No pathologically enlarged mediastinal or axillary lymph nodes. Hilar regions are difficult to evaluate without IV  contrast. Esophagus is grossly unremarkable. Lungs/Pleura: Centrilobular and paraseptal emphysema. Biapical pleuroparenchymal scarring. Dependent patchy ground-glass, septal thickening and areas of consolidation in the lower lobes, progressive from 09/25/2021. Mild patchy ground-glass in the posterior aspect of the upper lobes appears chronic. No pleural fluid. Adherent debris in the airway. Musculoskeletal: Osteopenia. Vertebral body augmentation. Old bilateral rib fractures. Numerous thoracic compression deformities with kyphosis, as on 02/15/2021. CT ABDOMEN PELVIS FINDINGS Hepatobiliary: Liver and gallbladder are unremarkable. Similar biliary ductal dilatation. Pancreas: Negative. Spleen: Negative. Adrenals/Urinary Tract: Adrenal glands are unremarkable. 5.9 cm cyst in the lower pole right kidney. Additional low and high attenuation lesions in the kidneys measure up to 1.8 cm and are likely cysts or too small to characterize. No specific follow-up necessary. Ureters are decompressed. Bladder is grossly unremarkable. Stomach/Bowel: Stomach, small bowel, appendix and colon are unremarkable. Vascular/Lymphatic: Atherosclerotic calcification of the aorta. No pathologically enlarged lymph nodes. Reproductive: Prostate is visualized and contains calcifications. Other: Mild presacral soft tissue thickening, unchanged. No free fluid. Mesenteries and peritoneum are unremarkable. Musculoskeletal: Old right inferior pubic ramus fracture. No acute fracture. Degenerative changes in the  spine. L1 vertebral body augmentation. L3, L4 and L5 compression fractures as on 09/25/2021. Large left gluteal hematoma measures approximately 5.0 x 10.2 cm, new from 09/25/2021. Pre-existing inferior left gluteal hematoma (2/121), similar to minimally enlarged. Overlying edema. IMPRESSION: 1. New and enlarging left gluteal hematomas. No underlying acute fracture. 2. Bilateral lower lobe ground-glass, septal thickening and areas of consolidation, worsening in the interval from 09/25/2021, worrisome for progressive pneumonia. 3. Aortic atherosclerosis (ICD10-I70.0). Coronary artery calcifications. 4.  Emphysema (ICD10-J43.9). Electronically Signed   By: Lorin Picket M.D.   On: 09/29/2021 12:13     Discharge Exam: Vitals:   10/03/21 0455 10/03/21 0822  BP: (!) 103/56   Pulse: 78   Resp: 18   Temp: 97.9 F (36.6 C)   SpO2: 96% 93%   Vitals:   10/02/21 2040 10/03/21 0121 10/03/21 0455 10/03/21 0822  BP: (!) 98/50 103/64 (!) 103/56   Pulse: 83 79 78   Resp: '16 16 18   '$ Temp: 98.4 F (36.9 C) 98 F (36.7 C) 97.9 F (36.6 C)   TempSrc: Oral Oral Oral   SpO2: 90% 91% 96% 93%  Weight:      Height:        General: Pt is alert, awake, not in acute distress Cardiovascular: RRR, S1/S2 +, no rubs, no gallops Respiratory: CTA bilaterally, no wheezing, no rhonchi Abdominal: Soft, NT, ND, bowel sounds + Extremities: no edema, no cyanosis    The results of significant diagnostics from this hospitalization (including imaging, microbiology, ancillary and laboratory) are listed below for reference.     Microbiology: Recent Results (from the past 240 hour(s))  Culture, blood (Routine X 2) w Reflex to ID Panel     Status: None   Collection Time: 09/25/21 12:14 PM   Specimen: BLOOD LEFT ARM  Result Value Ref Range Status   Specimen Description BLOOD LEFT ARM  Final   Special Requests   Final    BOTTLES DRAWN AEROBIC AND ANAEROBIC Blood Culture adequate volume   Culture   Final    NO  GROWTH 5 DAYS Performed at [facility], [address], [place], [place] [address]    Report Status 09/30/2021 FINAL  Final  Culture, blood (Routine X 2) w Reflex to ID Panel     Status: None   Collection Time: 09/25/21 12:14 PM   Specimen: BLOOD LEFT HAND  Result Value Ref Range Status   Specimen Description BLOOD LEFT HAND  Final   Special Requests   Final    BOTTLES DRAWN AEROBIC AND ANAEROBIC Blood Culture results may not be optimal due to an inadequate volume of blood received in culture bottles   Culture   Final    NO GROWTH 5 DAYS Performed at [facility], [address] [place], [place] [address]    Report Status 09/30/2021 FINAL  Final  MRSA Next Gen by PCR, Nasal     Status: None   Collection Time: 09/25/21  3:00 PM   Specimen: Nasal Mucosa; Nasal Swab  Result Value Ref Range Status   MRSA by PCR Next Gen NOT DETECTED NOT DETECTED Final    Comment: (NOTE) The GeneXpert MRSA Assay (FDA approved for NASAL specimens only), is one component of a comprehensive MRSA colonization surveillance program. It is not intended to diagnose MRSA infection nor to guide or monitor treatment for MRSA infections. Test performance is not FDA approved in patients less than 26 years old. Performed at [facility], [address] [place], [place] [address]      Labs: BNP (last 3 results) Recent Labs    02/15/21 1946 06/16/21 1135  BNP 231.0* 79.0   Basic Metabolic Panel: Recent Labs  Lab 09/29/21 0959 09/30/21 0450 10/01/21 0430 10/02/21 0519 10/03/21 0531  NA 136 137 136 135 137  K 3.9 3.7 3.6 3.8 3.9  CL 111 114* 113* 112* 110  CO2 20* 20* 21* 20* 22  GLUCOSE 92 103* 91 81 87  BUN 38* 36* 30* 26* 24*  CREATININE 2.33* 2.06* 1.83* 1.74* 1.78*  CALCIUM 8.6* 7.8* 7.6* 8.0* 8.5*   Liver Function Tests: Recent Labs  Lab 09/29/21 0959 09/30/21 0450  AST 22 18  ALT 13 13  ALKPHOS 63 52  BILITOT 1.4* 0.8  PROT 6.6 5.4*  ALBUMIN 3.0* 2.6*   No results for input(s):  LIPASE, AMYLASE in the last 168 hours. No results for input(s): AMMONIA in the last 168 hours. CBC: Recent Labs  Lab 09/29/21 0959 09/29/21 1722 09/30/21 0450 09/30/21 1744 10/01/21 0430 10/02/21 0519 10/03/21 0531  WBC 17.9* 18.6* 16.2* 17.6* 14.1* 14.6* 17.7*  NEUTROABS 8.8* 9.4*  --   --   --   --   --   HGB 6.9* 7.3* 5.6* 10.5* 9.1* 8.7* 8.1*  HCT 21.7* 22.4* 17.8* 32.5* 28.4* 28.4* 26.0*  MCV 90.8 90.3 90.4 84.9 85.3 86.6 86.1  PLT 32* 27* 60* 51* 41* 61* 56*   Cardiac Enzymes: No results for input(s): CKTOTAL, CKMB, CKMBINDEX, TROPONINI in the last 168 hours. BNP: Invalid input(s): POCBNP CBG: No results for input(s): GLUCAP in the last 168 hours. D-Dimer No results for input(s): DDIMER in the last 72 hours. Hgb A1c No results for input(s): HGBA1C in the last 72 hours. Lipid Profile No results for input(s): CHOL, HDL, LDLCALC, TRIG, CHOLHDL, LDLDIRECT in the last 72 hours. Thyroid function studies No results for input(s): TSH, T4TOTAL, T3FREE, THYROIDAB in the last 72 hours.  Invalid input(s): FREET3 Anemia work up No results for input(s): VITAMINB12, FOLATE, FERRITIN, TIBC, IRON, RETICCTPCT in the last 72 hours. Urinalysis    Component Value Date/Time   COLORURINE YELLOW 09/29/2021 0948   APPEARANCEUR CLEAR 09/29/2021 0948   APPEARANCEUR Hazy (A) 03/26/2021 1334   LABSPEC 1.012 09/29/2021 0948   PHURINE 5.0 09/29/2021 0948   GLUCOSEU NEGATIVE 09/29/2021 0948   HGBUR SMALL (A) 09/29/2021 0948   BILIRUBINUR NEGATIVE 09/29/2021  [place] Negative 03/26/2021 1334   [place] 09/29/2021 0948   PROTEINUR 30 (A) 09/29/2021 0948   UROBILINOGEN 0.2 08/15/2013 1240   NITRITE NEGATIVE 09/29/2021 0948   LEUKOCYTESUR NEGATIVE 09/29/2021 0948   Sepsis Labs Invalid input(s): PROCALCITONIN,  WBC,  LACTICIDVEN Microbiology Recent Results (from the past 240 hour(s))  Culture, blood (Routine X 2) w Reflex to ID Panel     Status: None   Collection  Time: 09/25/21 12:14 PM   Specimen: BLOOD LEFT ARM  Result Value Ref Range Status   Specimen Description BLOOD LEFT ARM  Final   Special Requests   Final    BOTTLES DRAWN AEROBIC AND ANAEROBIC Blood Culture adequate volume   Culture   Final    NO GROWTH 5 DAYS Performed at [facility], [address] [place], [place] [address]    Report Status 09/30/2021 FINAL  Final  Culture, blood (Routine X 2) w Reflex to ID Panel     Status: None   Collection Time: 09/25/21 12:14 PM   Specimen: BLOOD LEFT HAND  Result Value Ref Range Status   Specimen Description BLOOD LEFT HAND  Final   Special Requests   Final    BOTTLES DRAWN AEROBIC AND ANAEROBIC Blood Culture results may not be optimal due to an inadequate volume of blood received in culture bottles   Culture   Final    NO GROWTH 5 DAYS Performed at [facility], [address], [place], [place] [address]    Report Status 09/30/2021 FINAL  Final  MRSA Next Gen by PCR, Nasal     Status: None   Collection Time: 09/25/21  3:00 PM   Specimen: Nasal Mucosa; Nasal Swab  Result Value Ref Range Status   MRSA by PCR Next Gen NOT DETECTED NOT DETECTED Final    Comment: (NOTE) The GeneXpert MRSA Assay (FDA approved for NASAL specimens only), is one component of a comprehensive MRSA colonization surveillance program. It is not intended to diagnose MRSA infection nor to guide or monitor treatment for MRSA infections. Test performance is not FDA approved in patients less than 26 years old. Performed at [facility], [address] [place], [place] [address]      Time coordinating discharge: 35 minutes  SIGNED:   Rodena Goldmann, DO [facility] 10/03/2021, 10:54 AM  If 7PM-7AM, please contact night-coverage [email or web address]

## 2021-10-03 NOTE — TOC Transition Note (Signed)
Transition of Care [facility]) - CM/SW Discharge Note   Patient Details  Name: [name] MRN: [id number] Date of Birth: [birth year]  Transition of Care [facility]) CM/SW Contact:  Boneta Lucks, RN Phone Number: 10/03/2021, 11:03 AM   Clinical Narrative:   [facility] called to resent their bed offer, due to patient needing weekly transfusions.  CM spoke with Stanton Kidney( daughter) to discuss the two other offers. [facility] agreed to offer and aware of weekly appointments. Stanton Kidney is accepting. RN will call report, TOC will schedule EMS. Med necessity printed.    Final next level of care: Skilled Nursing Facility Barriers to Discharge: Barriers Resolved   Patient Goals and CMS Choice Patient states their goals for this hospitalization and ongoing recovery are:: agreeable to SNF CMS Medicare.gov Compare Post Acute Care list provided to:: Patient Represenative (must comment) Choice offered to / list presented to : Adult Children  Discharge Placement               Patient to be transferred to facility by: EMS Name of family member notified: [name] Patient and family notified of of transfer: 10/03/21  Discharge Plan and Services  BCE   Readmission Risk Interventions    10/03/2021   10:52 AM 10/02/2021   11:51 AM 09/30/2021    2:59 PM  Readmission Risk Prevention Plan  Transportation Screening  Complete   Medication Review ([place])   Complete  PCP or Specialist appointment within 3-5 days of discharge Complete  Not Complete  HRI or Home Care Consult   Complete  SW Recovery Care/Counseling Consult  Complete   Palliative Care Screening  Not [place]  Complete

## 2021-10-03 NOTE — Care Management Important Message (Signed)
Important Message  Patient Details  Name: [name] MRN: [id number] Date of Birth: 1930-02-27   Medicare Important Message Given:  Yes     [name] 10/03/2021, 12:37 PM

## 2021-10-04 LAB — BPAM PLATELET PHERESIS
Blood Product Expiration Date: 202305222359
ISSUE DATE / TIME: 202305191215
Unit Type and Rh: 7300

## 2021-10-04 LAB — PREPARE PLATELET PHERESIS: Unit division: 0

## 2021-10-05 ENCOUNTER — Other Ambulatory Visit: Payer: Self-pay

## 2021-10-05 ENCOUNTER — Encounter (HOSPITAL_COMMUNITY): Payer: Self-pay

## 2021-10-05 ENCOUNTER — Emergency Department (HOSPITAL_COMMUNITY)
Admission: EM | Admit: 2021-10-05 | Discharge: 2021-10-06 | Disposition: A | Discharge status: Skilled Nursing Facility | Payer: Medicare Other | Attending: Emergency Medicine | Admitting: Emergency Medicine

## 2021-10-05 DIAGNOSIS — D649 Anemia, unspecified: Secondary | ICD-10-CM | POA: Diagnosis not present

## 2021-10-05 DIAGNOSIS — Z79899 Other long term (current) drug therapy: Secondary | ICD-10-CM | POA: Insufficient documentation

## 2021-10-05 DIAGNOSIS — D469 Myelodysplastic syndrome, unspecified: Secondary | ICD-10-CM

## 2021-10-05 DIAGNOSIS — M79652 Pain in left thigh: Secondary | ICD-10-CM | POA: Diagnosis not present

## 2021-10-05 DIAGNOSIS — M25552 Pain in left hip: Secondary | ICD-10-CM | POA: Diagnosis not present

## 2021-10-05 DIAGNOSIS — Z79891 Long term (current) use of opiate analgesic: Secondary | ICD-10-CM | POA: Diagnosis not present

## 2021-10-05 LAB — CBC WITH DIFFERENTIAL/PLATELET
Abs Immature Granulocytes: 1.18 10*3/uL — ABNORMAL HIGH (ref 0.00–0.07)
Basophils Absolute: 0.1 10*3/uL (ref 0.0–0.1)
Basophils Relative: 1 %
Eosinophils Absolute: 0 10*3/uL (ref 0.0–0.5)
Eosinophils Relative: 0 %
HCT: 27.6 % — ABNORMAL LOW (ref 39.0–52.0)
Hemoglobin: 8.9 g/dL — ABNORMAL LOW (ref 13.0–17.0)
Immature Granulocytes: 8 %
Lymphocytes Relative: 5 %
Lymphs Abs: 0.8 10*3/uL (ref 0.7–4.0)
MCH: 27.6 pg (ref 26.0–34.0)
MCHC: 32.2 g/dL (ref 30.0–36.0)
MCV: 85.4 fL (ref 80.0–100.0)
Monocytes Absolute: 3.6 10*3/uL — ABNORMAL HIGH (ref 0.1–1.0)
Monocytes Relative: 24 %
Neutro Abs: 9.7 10*3/uL — ABNORMAL HIGH (ref 1.7–7.7)
Neutrophils Relative %: 62 %
Platelets: 54 10*3/uL — ABNORMAL LOW (ref 150–400)
RBC: 3.23 MIL/uL — ABNORMAL LOW (ref 4.22–5.81)
RDW: 19.4 % — ABNORMAL HIGH (ref 11.5–15.5)
Smear Review: DECREASED
WBC: 15.5 10*3/uL — ABNORMAL HIGH (ref 4.0–10.5)
nRBC: 1 % — ABNORMAL HIGH (ref 0.0–0.2)

## 2021-10-05 LAB — BASIC METABOLIC PANEL
Anion gap: 4 — ABNORMAL LOW (ref 5–15)
BUN: 26 mg/dL — ABNORMAL HIGH (ref 8–23)
CO2: 23 mmol/L (ref 22–32)
Calcium: 8.8 mg/dL — ABNORMAL LOW (ref 8.9–10.3)
Chloride: 105 mmol/L (ref 98–111)
Creatinine, Ser: 1.56 mg/dL — ABNORMAL HIGH (ref 0.61–1.24)
GFR, Estimated: 42 mL/min — ABNORMAL LOW (ref >60)
Glucose, Bld: 105 mg/dL — ABNORMAL HIGH (ref 70–99)
Potassium: 4 mmol/L (ref 3.5–5.1)
Sodium: 132 mmol/L — ABNORMAL LOW (ref 135–145)

## 2021-10-05 LAB — SAMPLE TO BLOOD BANK

## 2021-10-05 MED ORDER — PREDNISONE 10 MG PO TABS
5.0000 mg | ORAL_TABLET | Freq: Every day | ORAL | Status: DC
  Administered 2021-10-06: 5 mg via ORAL
  Filled 2021-10-05: qty 1

## 2021-10-05 MED ORDER — UMECLIDINIUM-VILANTEROL 62.5-25 MCG/ACT IN AEPB
1.0000 | INHALATION_SPRAY | Freq: Every day | RESPIRATORY_TRACT | Status: DC
  Administered 2021-10-06: 1 via RESPIRATORY_TRACT
  Filled 2021-10-05: qty 14

## 2021-10-05 MED ORDER — TRAMADOL HCL 50 MG PO TABS
50.0000 mg | ORAL_TABLET | Freq: Three times a day (TID) | ORAL | Status: DC | PRN
  Administered 2021-10-05 – 2021-10-06 (×2): 50 mg via ORAL
  Filled 2021-10-05 (×2): qty 1

## 2021-10-05 MED ORDER — MIDODRINE HCL 5 MG PO TABS
5.0000 mg | ORAL_TABLET | Freq: Three times a day (TID) | ORAL | Status: DC
Start: 2021-10-06 — End: 2021-10-06
  Administered 2021-10-06 (×2): 5 mg via ORAL
  Filled 2021-10-05 (×2): qty 1

## 2021-10-05 MED ORDER — ALBUTEROL SULFATE HFA 108 (90 BASE) MCG/ACT IN AERS: 2.0000 | INHALATION_SPRAY | RESPIRATORY_TRACT | Status: DC | PRN

## 2021-10-05 MED ORDER — OXYCODONE HCL 5 MG PO TABS
5.0000 mg | ORAL_TABLET | Freq: Four times a day (QID) | ORAL | Status: DC | PRN
  Administered 2021-10-05 – 2021-10-06 (×2): 5 mg via ORAL
  Filled 2021-10-05 (×2): qty 1

## 2021-10-05 NOTE — ED Provider Notes (Signed)
[facility] Provider Note   CSN: [id number] Arrival date & time: 10/05/21  1110     History  Chief Complaint  Patient presents with   Hip Pain    [name] is a 88 y.o. male.   Hip Pain Pertinent negatives include no chest pain. Patient presents from nursing home.  Left hip pain.  Has had a fall and recent admissions to hospital.  Has had bruises.  Has had anemia.  History of myelodysplastic syndrome.  Discharged 2 days ago now uncontrolled pain.  No new injury.  States pain in his left buttock and goes down to the feet.  Both feet been bothering him.  No new injury.  No fevers or chills.  Uncontrolled by the oxycodone he was on overall but states that it been working if he got it with some better timing.     Home Medications Prior to Admission medications   Medication Sig Start Date End Date Taking? Authorizing Provider  albuterol (VENTOLIN HFA) 108 (90 Base) MCG/ACT inhaler Inhale 2 puffs into the lungs every 4 (four) hours as needed for wheezing or shortness of breath. 04/04/20   Roxan Hockey, MD  midodrine (PROAMATINE) 5 MG tablet Take 1 tablet (5 mg total) by mouth 3 (three) times daily with meals. 09/21/21   Dessa Phi, DO  oxyCODONE (OXY IR/ROXICODONE) 5 MG immediate release tablet Take 1 tablet (5 mg total) by mouth every 6 (six) hours as needed for severe pain. 10/03/21   Manuella Ghazi, Pratik D, DO  predniSONE (DELTASONE) 5 MG tablet Take 5 mg by mouth daily with breakfast.    [provider]  silodosin (RAPAFLO) 4 MG CAPS capsule Take 4 mg by mouth at bedtime.    [provider]  traMADol (ULTRAM) 50 MG tablet Take 1 tablet (50 mg total) by mouth every 8 (eight) hours as needed for moderate pain. 10/03/21   Manuella Ghazi, Pratik D, DO  umeclidinium-vilanterol (ANORO ELLIPTA) 62.5-25 MCG/ACT AEPB Inhale 1 puff into the lungs daily. Patient not taking: Reported on 09/29/2021 08/29/21   Tanda Rockers, MD      Allergies    Patient has no  known allergies.    Review of Systems   Review of Systems  Constitutional:  Negative for appetite change.  Cardiovascular:  Negative for chest pain.  Musculoskeletal:  Positive for back pain.       Left hip buttock pain.  Hematological:  Bruises/bleeds easily.   Physical Exam Updated Vital Signs BP (!) 133/56   Pulse 86   Temp 97.7 F (36.5 C) (Oral)   Resp 16   Ht '6\' 4"'$  (1.93 m)   Wt 58 kg   SpO2 93%   BMI 15.56 kg/m  Physical Exam Vitals and nursing note reviewed.  Cardiovascular:     Rate and Rhythm: Regular rhythm.  Abdominal:     Tenderness: There is no abdominal tenderness.  Musculoskeletal:        General: Tenderness present.     Cervical back: Neck supple.     Comments: Swelling and ecchymosis vertically on left buttock area.  Does have bruising down the posterior thigh and somewhat on the left flank with some bruising over to the right lower back also.  Skin:    Capillary Refill: Capillary refill takes less than 2 seconds.  Neurological:     Mental Status: He is alert and oriented to person, place, and time.    ED Results / Procedures / Treatments   Labs (  all labs ordered are listed, but only abnormal results are displayed) Labs Reviewed  BASIC METABOLIC PANEL - Abnormal; Notable for the following components:      Result Value   Sodium 132 (*)    Glucose, Bld 105 (*)    BUN 26 (*)    Creatinine, Ser 1.56 (*)    Calcium 8.8 (*)    GFR, Estimated 42 (*)    Anion gap 4 (*)    All other components within normal limits  CBC WITH DIFFERENTIAL/PLATELET - Abnormal; Notable for the following components:   WBC 15.5 (*)    RBC 3.23 (*)    Hemoglobin 8.9 (*)    HCT 27.6 (*)    RDW 19.4 (*)    Platelets 54 (*)    nRBC 1.0 (*)    Neutro Abs 9.7 (*)    Monocytes Absolute 3.6 (*)    Abs Immature Granulocytes 1.18 (*)    All other components within normal limits  SAMPLE TO BLOOD BANK    EKG None  Radiology No results found.  Procedures Procedures     Medications Ordered in ED Medications - No data to display  ED Course/ Medical Decision Making/ A&P                           Medical Decision Making Amount and/or Complexity of Data Reviewed Labs: ordered.   Patient with fall this resulting in hematoma and left buttock area.  Has history of myelodysplastic syndrome however in the end up requiring admission in the hospital and transfusion.  Also has had poorly controlled pain at the nursing home he is at.  States they are not giving the medicines office needs to.  States he did get hydrocodone and it helped but was not dosed frequently enough.  Does have bruising on buttock.  Appears somewhat chronic per notes.  Has hemoglobin white count and platelets that are stable.  Feels that he would not need new transfusion at this time.  Appears to just be somewhat poorly controlled medicine. I would increase the 5 mg oxycodone to every 4 hours instead of every 6.  However patient is daughter wish to go home instead of back to the nursing home/rehab.  Will get transition of care to hopefully help set up some home health.  Patient has oncology appointment tomorrow.  Care will be turned over to Dr. Rogene Houston        Final Clinical Impression(s) / ED Diagnoses Final diagnoses:  Left hip pain  MDS (myelodysplastic syndrome) [facility])    Rx / [place] Orders ED Discharge Orders     None         Davonna Belling, MD 10/05/21 1458

## 2021-10-05 NOTE — ED Notes (Signed)
Patient also has stage 1 pressure ulcer to sacrum with significant bruising up left back and scrotum.

## 2021-10-05 NOTE — ED Provider Notes (Signed)
Patient's family refusing for him to go back home.  They want him replaced in a nursing facility.  Social work case Mudlogger and physical therapy consult submitted.   Fredia Sorrow, MD 10/05/21 1729

## 2021-10-05 NOTE — ED Notes (Signed)
Per daughter, she does not feel comfortable enough to take patient home with home health. Dr. Gwenlyn Saran made aware and states he will follow up with TOC and Pt/OT. Daughter is agreeable to this plan and verbalizes understanding

## 2021-10-05 NOTE — ED Triage Notes (Signed)
Patient recently seen here for weakness and fall. Patient has bruising to bilateral hips and lower back. Patient has bruises inside bilaterals thighs. Patient complaining of increased pain to left hip chronically.

## 2021-10-05 NOTE — ED Notes (Signed)
Patient complains of left hip pain and left hip propped on a pillow and states pain has went away.

## 2021-10-06 ENCOUNTER — Inpatient Hospital Stay (HOSPITAL_COMMUNITY): Payer: Medicare Other

## 2021-10-06 ENCOUNTER — Encounter (HOSPITAL_COMMUNITY): Payer: Medicare Other | Admitting: Dietician

## 2021-10-06 DIAGNOSIS — Z961 Presence of intraocular lens: Secondary | ICD-10-CM | POA: Diagnosis present

## 2021-10-06 DIAGNOSIS — D469 Myelodysplastic syndrome, unspecified: Secondary | ICD-10-CM | POA: Diagnosis not present

## 2021-10-06 DIAGNOSIS — R0602 Shortness of breath: Secondary | ICD-10-CM | POA: Diagnosis not present

## 2021-10-06 DIAGNOSIS — I9589 Other hypotension: Secondary | ICD-10-CM | POA: Diagnosis not present

## 2021-10-06 DIAGNOSIS — D500 Iron deficiency anemia secondary to blood loss (chronic): Secondary | ICD-10-CM | POA: Diagnosis not present

## 2021-10-06 DIAGNOSIS — Z87891 Personal history of nicotine dependence: Secondary | ICD-10-CM | POA: Diagnosis not present

## 2021-10-06 DIAGNOSIS — Z741 Need for assistance with personal care: Secondary | ICD-10-CM | POA: Diagnosis not present

## 2021-10-06 DIAGNOSIS — I700 Atherosclerosis of aorta: Secondary | ICD-10-CM | POA: Diagnosis not present

## 2021-10-06 DIAGNOSIS — C419 Malignant neoplasm of bone and articular cartilage, unspecified: Secondary | ICD-10-CM | POA: Diagnosis not present

## 2021-10-06 DIAGNOSIS — J449 Chronic obstructive pulmonary disease, unspecified: Secondary | ICD-10-CM | POA: Diagnosis not present

## 2021-10-06 DIAGNOSIS — N1832 Chronic kidney disease, stage 3b: Secondary | ICD-10-CM | POA: Diagnosis not present

## 2021-10-06 DIAGNOSIS — Z9181 History of falling: Secondary | ICD-10-CM | POA: Diagnosis not present

## 2021-10-06 DIAGNOSIS — T148XXA Other injury of unspecified body region, initial encounter: Secondary | ICD-10-CM | POA: Diagnosis not present

## 2021-10-06 DIAGNOSIS — D649 Anemia, unspecified: Secondary | ICD-10-CM | POA: Diagnosis not present

## 2021-10-06 DIAGNOSIS — E860 Dehydration: Secondary | ICD-10-CM | POA: Diagnosis not present

## 2021-10-06 DIAGNOSIS — R233 Spontaneous ecchymoses: Secondary | ICD-10-CM | POA: Diagnosis not present

## 2021-10-06 DIAGNOSIS — Y848 Other medical procedures as the cause of abnormal reaction of the patient, or of later complication, without mention of misadventure at the time of the procedure: Secondary | ICD-10-CM | POA: Diagnosis present

## 2021-10-06 DIAGNOSIS — S300XXS Contusion of lower back and pelvis, sequela: Secondary | ICD-10-CM | POA: Diagnosis not present

## 2021-10-06 DIAGNOSIS — R7881 Bacteremia: Secondary | ICD-10-CM | POA: Diagnosis present

## 2021-10-06 DIAGNOSIS — L089 Local infection of the skin and subcutaneous tissue, unspecified: Secondary | ICD-10-CM | POA: Diagnosis not present

## 2021-10-06 DIAGNOSIS — Z841 Family history of disorders of kidney and ureter: Secondary | ICD-10-CM | POA: Diagnosis not present

## 2021-10-06 DIAGNOSIS — J439 Emphysema, unspecified: Secondary | ICD-10-CM | POA: Diagnosis not present

## 2021-10-06 DIAGNOSIS — Z9841 Cataract extraction status, right eye: Secondary | ICD-10-CM | POA: Diagnosis not present

## 2021-10-06 DIAGNOSIS — Y929 Unspecified place or not applicable: Secondary | ICD-10-CM | POA: Diagnosis not present

## 2021-10-06 DIAGNOSIS — J432 Centrilobular emphysema: Secondary | ICD-10-CM | POA: Diagnosis not present

## 2021-10-06 DIAGNOSIS — E43 Unspecified severe protein-calorie malnutrition: Secondary | ICD-10-CM | POA: Diagnosis not present

## 2021-10-06 DIAGNOSIS — I2510 Atherosclerotic heart disease of native coronary artery without angina pectoris: Secondary | ICD-10-CM | POA: Diagnosis not present

## 2021-10-06 DIAGNOSIS — D62 Acute posthemorrhagic anemia: Secondary | ICD-10-CM | POA: Diagnosis not present

## 2021-10-06 DIAGNOSIS — M79652 Pain in left thigh: Secondary | ICD-10-CM | POA: Diagnosis not present

## 2021-10-06 DIAGNOSIS — R911 Solitary pulmonary nodule: Secondary | ICD-10-CM | POA: Diagnosis not present

## 2021-10-06 DIAGNOSIS — Z79891 Long term (current) use of opiate analgesic: Secondary | ICD-10-CM | POA: Diagnosis not present

## 2021-10-06 DIAGNOSIS — S20211A Contusion of right front wall of thorax, initial encounter: Secondary | ICD-10-CM | POA: Diagnosis not present

## 2021-10-06 DIAGNOSIS — Z66 Do not resuscitate: Secondary | ICD-10-CM | POA: Diagnosis present

## 2021-10-06 DIAGNOSIS — I950 Idiopathic hypotension: Secondary | ICD-10-CM | POA: Diagnosis not present

## 2021-10-06 DIAGNOSIS — R262 Difficulty in walking, not elsewhere classified: Secondary | ICD-10-CM | POA: Diagnosis not present

## 2021-10-06 DIAGNOSIS — D61818 Other pancytopenia: Secondary | ICD-10-CM | POA: Diagnosis present

## 2021-10-06 DIAGNOSIS — Z7401 Bed confinement status: Secondary | ICD-10-CM | POA: Diagnosis not present

## 2021-10-06 DIAGNOSIS — T80218A Other infection due to central venous catheter, initial encounter: Secondary | ICD-10-CM | POA: Diagnosis present

## 2021-10-06 DIAGNOSIS — Z9842 Cataract extraction status, left eye: Secondary | ICD-10-CM | POA: Diagnosis not present

## 2021-10-06 DIAGNOSIS — M6281 Muscle weakness (generalized): Secondary | ICD-10-CM | POA: Diagnosis not present

## 2021-10-06 DIAGNOSIS — Z7952 Long term (current) use of systemic steroids: Secondary | ICD-10-CM | POA: Diagnosis not present

## 2021-10-06 DIAGNOSIS — R918 Other nonspecific abnormal finding of lung field: Secondary | ICD-10-CM | POA: Diagnosis not present

## 2021-10-06 DIAGNOSIS — D72829 Elevated white blood cell count, unspecified: Secondary | ICD-10-CM | POA: Diagnosis not present

## 2021-10-06 DIAGNOSIS — Z978 Presence of other specified devices: Secondary | ICD-10-CM | POA: Diagnosis not present

## 2021-10-06 DIAGNOSIS — J9611 Chronic respiratory failure with hypoxia: Secondary | ICD-10-CM | POA: Diagnosis present

## 2021-10-06 DIAGNOSIS — R6510 Systemic inflammatory response syndrome (SIRS) of non-infectious origin without acute organ dysfunction: Secondary | ICD-10-CM | POA: Diagnosis not present

## 2021-10-06 DIAGNOSIS — K219 Gastro-esophageal reflux disease without esophagitis: Secondary | ICD-10-CM | POA: Diagnosis not present

## 2021-10-06 DIAGNOSIS — R52 Pain, unspecified: Secondary | ICD-10-CM | POA: Diagnosis not present

## 2021-10-06 DIAGNOSIS — S7002XS Contusion of left hip, sequela: Secondary | ICD-10-CM | POA: Diagnosis not present

## 2021-10-06 DIAGNOSIS — T80212A Local infection due to central venous catheter, initial encounter: Secondary | ICD-10-CM | POA: Diagnosis not present

## 2021-10-06 DIAGNOSIS — J441 Chronic obstructive pulmonary disease with (acute) exacerbation: Secondary | ICD-10-CM | POA: Diagnosis not present

## 2021-10-06 DIAGNOSIS — D696 Thrombocytopenia, unspecified: Secondary | ICD-10-CM | POA: Diagnosis not present

## 2021-10-06 DIAGNOSIS — R0789 Other chest pain: Secondary | ICD-10-CM | POA: Diagnosis not present

## 2021-10-06 DIAGNOSIS — Z515 Encounter for palliative care: Secondary | ICD-10-CM | POA: Diagnosis not present

## 2021-10-06 DIAGNOSIS — Z79899 Other long term (current) drug therapy: Secondary | ICD-10-CM | POA: Diagnosis not present

## 2021-10-06 DIAGNOSIS — R54 Age-related physical debility: Secondary | ICD-10-CM | POA: Diagnosis present

## 2021-10-06 DIAGNOSIS — K2100 Gastro-esophageal reflux disease with esophagitis, without bleeding: Secondary | ICD-10-CM | POA: Diagnosis not present

## 2021-10-06 DIAGNOSIS — R531 Weakness: Secondary | ICD-10-CM | POA: Diagnosis not present

## 2021-10-06 DIAGNOSIS — R2681 Unsteadiness on feet: Secondary | ICD-10-CM | POA: Diagnosis not present

## 2021-10-06 DIAGNOSIS — M25552 Pain in left hip: Secondary | ICD-10-CM | POA: Diagnosis not present

## 2021-10-06 DIAGNOSIS — N189 Chronic kidney disease, unspecified: Secondary | ICD-10-CM | POA: Diagnosis not present

## 2021-10-06 DIAGNOSIS — W19XXXS Unspecified fall, sequela: Secondary | ICD-10-CM | POA: Diagnosis present

## 2021-10-06 NOTE — Progress Notes (Addendum)
CSW spoke with pt's daughter [name], she stated she is wanting her dad to switch to a different facility. She reported the facility pt was d/c to last week [facility] rehab did not provide the care she wanted. CSW explained the SNF process. Pt daughter stated she would like pt placed in [facility]. CSW will send pt's information out for placement.   Bed offers pending. TOC to follow.  [place]  Pt was declined by [facility]. Pt received bed [facility] from [facility]. CSW provided update to daughter. MD and RN made aware.     Arlie Solomons.Torah Pinnock, MSW, [place]  Transitions of Care Clinical Social Worker I Direct Dial: [phone]  Fax: [phone] [email or web address]

## 2021-10-06 NOTE — ED Notes (Signed)
PT into room, at BS 

## 2021-10-06 NOTE — ED Notes (Addendum)
Mepilex sacral pad applied to sacral stage 1 redness, skin intact. Moved back

## 2021-10-06 NOTE — ED Notes (Signed)
Pt sleeping, NAD, calm, occ moan, VSS.

## 2021-10-06 NOTE — NC FL2 (Signed)
[place] LEVEL OF CARE SCREENING TOOL     IDENTIFICATION  Patient Name: [name] Birthdate: Oct 15, 1929 Sex: male Admission Date (Current Location): 10/05/2021  [facility] and [place] Number:  [employer] and Address:  [place] [address] [place]      Provider Number: [id number]  Attending Physician Name and Address:  Default, Provider, MD  Relative Name and Phone Number:  Lahoma Rocker (Daughter)   [phone] (Home Phone    Current Level of Care: Hospital Recommended Level of Care: [place] Prior Approval Number:    Date Approved/Denied:   PASRR Number: [id number]  Discharge Plan: SNF    Current Diagnoses: Patient Active Problem List   Diagnosis Date Noted   Acute blood loss anemia 09/29/2021   Traumatic hematoma of left hip 09/29/2021   Pressure injury of skin 09/25/2021   Fall at home, initial encounter 09/25/2021   Symptomatic anemia 09/20/2021   Chronic hypotension 09/20/2021   Small bowel bleed requiring more than 4 units of blood in 24 hours, ICU, or surgery 09/15/2021   Pulmonary infiltrates on CXR 08/30/2021   Hemorrhage from open wound of left chest wall 08/12/2021   Severe sepsis ([place]) 08/09/2021   AKI (acute kidney injury) ([place]) 08/09/2021   Infected venous access port, initial encounter 08/07/2021   Acute on chronic respiratory failure with hypoxia ([place]) 07/22/2021   Hypokalemia 07/22/2021   [place]    Goals of care, counseling/discussion 07/15/2021   Protein-calorie malnutrition, moderate (HCC) 07/14/2021   Heme positive stool 07/13/2021   Acute on chronic anemia 07/12/2021   Near syncope 06/17/2021   Neoplastic malignant related fatigue 01/21/2021   Depression 12/02/2020   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   Hematuria 12/02/2020   Osteoporosis 12/02/2020   Low back pain 12/02/2020   Syncope 11/11/2020   COVID-19 virus infection 11/11/2020   COPD Group B  clinically,  mild/mod severity  04/03/2020   Acute on chronic blood loss anemia 04/03/2020   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 04/03/2020   Urinary frequency 02/26/2020   Benign prostatic hyperplasia with urinary obstruction 02/26/2020   Myelodysplastic syndrome ([place]) 06/16/2019   HAP (hospital-acquired pneumonia) 06/16/2019   CKD (chronic kidney disease), stage IIIB 06/16/2019   Sepsis ([place]) 06/16/2019   Thrombocytopenia ([place]) 06/06/2019   Intractable back pain 06/06/2019   Chronic kidney insufficiency    Body mass index (BMI) 19.9 or less, adult 04/20/2019   Compression fracture of lumbar vertebra ([place]) 04/20/2019   Elevated blood-pressure reading, without diagnosis of hypertension 04/20/2019   Acute low back pain 03/23/2019   Pulmonary nodule 05/19/2018   Bronchiectasis ([place]) 05/19/2018   Anemia 09/17/2017   Abnormal CT of the abdomen 11/02/2016   Loss of weight 11/02/2016   History of colonic polyps 11/02/2016   GERD (gastroesophageal reflux disease) 10/01/2015    Orientation RESPIRATION BLADDER Height & Weight     Self, Time, Situation  Normal Continent Weight: 127 lb 13.9 oz (58 kg) Height:  '6\' 4"'$  (193 cm)  BEHAVIORAL SYMPTOMS/MOOD NEUROLOGICAL BOWEL NUTRITION STATUS      Continent Diet (regualr)  AMBULATORY STATUS COMMUNICATION OF NEEDS Skin   Extensive Assist Verbally Bruising, Skin abrasions, PU Stage and Appropriate Care (Abrasion- chest , Ecchymosis- arm , hip ,leg)                       Personal Care Assistance Level of Assistance  Bathing, Feeding, Dressing Bathing  Assistance: Maximum assistance Feeding assistance: Limited assistance Dressing Assistance: Maximum assistance     Functional Limitations Info  Sight, Hearing Sight Info: Impaired Hearing Info: Impaired Speech Info: Adequate    SPECIAL CARE FACTORS FREQUENCY  PT (By licensed PT), OT (By licensed OT)     PT Frequency: 5 x a  week OT Frequency: 5 x a week            Contractures Contractures Info: Not present    Additional Factors Info  Code Status, Allergies Code Status Info: full Allergies Info: no known allergies           Current Medications (10/06/2021):  This is the current hospital active medication list Current Facility-Administered Medications  Medication Dose Route Frequency Provider Last Rate Last Admin   albuterol (VENTOLIN HFA) 108 (90 Base) MCG/ACT inhaler 2 puff  2 puff Inhalation Q4H PRN Fredia Sorrow, MD       midodrine (PROAMATINE) tablet 5 mg  5 mg Oral TID WC Fredia Sorrow, MD   5 mg at 10/06/21 2633   oxyCODONE (Oxy IR/ROXICODONE) immediate release tablet 5 mg  5 mg Oral Q6H PRN Fredia Sorrow, MD   5 mg at 10/06/21 3545   predniSONE (DELTASONE) tablet 5 mg  5 mg Oral Q breakfast Fredia Sorrow, MD   5 mg at 10/06/21 0829   traMADol (ULTRAM) tablet 50 mg  50 mg Oral Q8H PRN Fredia Sorrow, MD   50 mg at 10/06/21 6256   umeclidinium-vilanterol (ANORO ELLIPTA) 62.5-25 MCG/ACT 1 puff  1 puff Inhalation Daily Fredia Sorrow, MD   1 puff at 10/06/21 3893   Current Outpatient Medications  Medication Sig Dispense Refill   albuterol (VENTOLIN HFA) 108 (90 Base) MCG/ACT inhaler Inhale 2 puffs into the lungs every 4 (four) hours as needed for wheezing or shortness of breath. 18 g 2   midodrine (PROAMATINE) 5 MG tablet Take 1 tablet (5 mg total) by mouth 3 (three) times daily with meals. 90 tablet 0   predniSONE (DELTASONE) 5 MG tablet Take 5 mg by mouth daily with breakfast.     traMADol (ULTRAM) 50 MG tablet Take 1 tablet (50 mg total) by mouth every 8 (eight) hours as needed for moderate pain. 10 tablet 0   umeclidinium-vilanterol (ANORO ELLIPTA) 62.5-25 MCG/ACT AEPB Inhale 1 puff into the lungs daily. 1 each 11   oxyCODONE (OXY IR/ROXICODONE) 5 MG immediate release tablet Take 1 tablet (5 mg total) by mouth every 6 (six) hours as needed for severe pain. 10 tablet 0    Facility-Administered Medications Ordered in Other Encounters  Medication Dose Route Frequency Provider Last Rate Last Admin   0.9 %  sodium chloride infusion   Intravenous Continuous Derek Jack, MD   Stopped at 10/02/21 1100   octreotide (SANDOSTATIN LAR) 30 MG IM injection            sodium chloride flush (NS) 0.9 % injection 10 mL  10 mL Intracatheter PRN Derek Jack, MD   10 mL at 01/06/21 1215   sodium chloride flush (NS) 0.9 % injection 10 mL  10 mL Intravenous PRN Derek Jack, MD   10 mL at 02/17/21 0902   sodium chloride flush (NS) 0.9 % injection 10 mL  10 mL Intravenous PRN Derek Jack, MD   10 mL at 03/10/21 7342     Discharge Medications: Please see discharge summary for a list of discharge medications.  Relevant Imaging Results:  Relevant Lab Results:   Additional Information [phone]  Arlie Solomons Elliette Seabolt, LCSW

## 2021-10-06 NOTE — Plan of Care (Signed)
  Problem: Acute Rehab PT Goals(only PT should resolve) Goal: Pt Will Go Supine/Side To Sit Outcome: Progressing Flowsheets (Taken 10/06/2021 1209) Pt will go Supine/Side to Sit:  with supervision  with modified independence Goal: Patient Will Transfer Sit To/From Stand Outcome: Progressing Flowsheets (Taken 10/06/2021 1209) Patient will transfer sit to/from stand:  with min guard assist  with minimal assist Goal: Pt Will Transfer Bed To Chair/Chair To Bed Outcome: Progressing Flowsheets (Taken 10/06/2021 1209) Pt will Transfer Bed to Chair/Chair to Bed:  with min assist  min guard assist Goal: Pt Will Ambulate Outcome: Progressing Flowsheets (Taken 10/06/2021 1209) Pt will Ambulate:  50 feet  with minimal assist  with rolling walker   12:10 PM, 10/06/21 Lonell Grandchild, MPT Physical Therapist with [facility] [phone] office [phone] mobile phone

## 2021-10-06 NOTE — Evaluation (Signed)
Physical Therapy Evaluation Patient Details Name: [name] MRN: [id number] DOB: March 16, 1930 Today's Date: 10/06/2021  History of Present Illness  [name] is a 88 y.o. male.        Hip Pain  Pertinent negatives include no chest pain. Patient presents from nursing home.  Left hip pain.  Has had a fall and recent admissions to hospital.  Has had bruises.  Has had anemia.  History of myelodysplastic syndrome.  Discharged 2 days ago now uncontrolled pain.  No new injury.  States pain in his left buttock and goes down to the feet.  Both feet been bothering him.  No new injury.  No fevers or chills.  Uncontrolled by the oxycodone he was on overall but states that it been working if he got it with some better timing.   Clinical Impression  Patient demonstrates slow labored movement for sitting up at bedside with fair/good return propping up on elbows to hands during supine to sitting, unsteady on feet and limited to a few side steps using RW mostly due to c/o of generalized weakness and mild increase in pain left hip when standing.  Patient tolerated sitting up in chair after therapy with his daughter present in room - RN notified.  Patient will benefit from continued skilled physical therapy in hospital and recommended venue below to increase strength, balance, endurance for safe ADLs and gait.         Recommendations for follow up therapy are one component of a multi-disciplinary discharge planning process, led by the attending physician.  Recommendations may be updated based on patient status, additional functional criteria and insurance authorization.  Follow Up Recommendations Skilled nursing-short term rehab (<3 hours/day)    Assistance Recommended at Discharge Set up Supervision/Assistance  Patient can return home with the following  A lot of help with walking and/or transfers;A lot of help with bathing/dressing/bathroom;Assistance with cooking/housework;Help with stairs or ramp for  entrance    Equipment Recommendations None recommended by PT  Recommendations for Other Services       Functional Status Assessment Patient has had a recent decline in their functional status and demonstrates the ability to make significant improvements in function in a reasonable and predictable amount of time.     Precautions / Restrictions Precautions Precautions: Fall Restrictions Weight Bearing Restrictions: No      Mobility  Bed Mobility Overal bed mobility: Needs Assistance Bed Mobility: Supine to Sit     Supine to sit: Min guard, Min assist     General bed mobility comments: slow labored movement    Transfers Overall transfer level: Needs assistance Equipment used: Rolling walker (2 wheels) Transfers: Sit to/from Stand, Bed to chair/wheelchair/BSC Sit to Stand: Min assist, Mod assist   Step pivot transfers: Min assist, Mod assist       General transfer comment: unsteady labored movement    Ambulation/Gait Ambulation/Gait assistance: Mod assist Gait Distance (Feet): 3 Feet Assistive device: Rolling walker (2 wheels) Gait Pattern/deviations: Decreased step length - right, Decreased step length - left, Decreased stance time - left, Decreased stride length Gait velocity: decreased     General Gait Details: limited to a few slow labored side steps before having to sit due to fatigue and generalized weakness  Stairs            Wheelchair Mobility    Modified Rankin (Stroke Patients Only)       Balance Overall balance assessment: Needs assistance Sitting-balance support: Feet supported, No upper extremity supported  Sitting balance-Leahy Scale: Fair Sitting balance - Comments: fair/good seated at EOB   Standing balance support: During functional activity, Bilateral upper extremity supported Standing balance-Leahy Scale: Poor Standing balance comment: using RW                             Pertinent Vitals/Pain Pain  Assessment Pain Assessment: Faces Faces Pain Scale: Hurts a little bit Pain Location: left hip Pain Descriptors / Indicators: Sore Pain Intervention(s): Limited activity within patient's tolerance, Monitored during session, Repositioned    Home Living Family/patient expects to be discharged to:: Private residence Living Arrangements: Alone   Type of Home: Mobile home Home Access: Ramped entrance       Home Layout: One level Home Equipment: Conservation officer, nature (2 wheels);Cane - single point;Electric scooter;BSC/3in1;Shower seat - built in      Prior Function Prior Level of Function : Independent/Modified Independent             Mobility Comments: household ambulator using RW, uses SPC for community, drives ADLs Comments: Independent     Hand Dominance   Dominant Hand: Right    Extremity/Trunk Assessment   Upper Extremity Assessment Upper Extremity Assessment: Generalized weakness    Lower Extremity Assessment Lower Extremity Assessment: Generalized weakness    Cervical / Trunk Assessment Cervical / Trunk Assessment: Normal  Communication   Communication: No difficulties  Cognition Arousal/Alertness: Awake/alert Behavior During Therapy: WFL for tasks assessed/performed Overall Cognitive Status: Within Functional Limits for tasks assessed                                          General Comments      Exercises     Assessment/Plan    PT Assessment Patient needs continued PT services  PT Problem List Decreased strength;Decreased activity tolerance;Decreased balance;Decreased mobility       PT Treatment Interventions DME instruction;Gait training;Stair training;Functional mobility training;Therapeutic activities;Therapeutic exercise;Balance training;Patient/family education    PT Goals (Current goals can be found in the Care Plan section)  Acute Rehab PT Goals Patient Stated Goal: return home after rehab PT Goal Formulation: With  patient/family Time For Goal Achievement: 10/20/21 Potential to Achieve Goals: Good    Frequency Min 2X/week     Co-evaluation               AM-PAC PT "6 Clicks" Mobility  Outcome Measure Help needed turning from your back to your side while in a flat bed without using bedrails?: A Little Help needed moving from lying on your back to sitting on the side of a flat bed without using bedrails?: A Little Help needed moving to and from a bed to a chair (including a wheelchair)?: A Lot Help needed standing up from a chair using your arms (e.g., wheelchair or bedside chair)?: A Lot Help needed to walk in hospital room?: A Lot Help needed climbing 3-5 steps with a railing? : A Lot 6 Click Score: 14    End of Session   Activity Tolerance: Patient tolerated treatment well;Patient limited by fatigue Patient left: in chair;with call bell/phone within reach;with family/visitor present Nurse Communication: Mobility status PT Visit Diagnosis: Unsteadiness on feet (R26.81);Other abnormalities of gait and mobility (R26.89);Muscle weakness (generalized) (M62.81)    Time: 7673-4193 PT Time Calculation (min) (ACUTE ONLY): 20 min   Charges:   PT Evaluation $PT Eval Moderate  Complexity: 1 Mod PT Treatments $Therapeutic Activity: 8-22 mins        12:06 PM, 10/06/21 Lonell Grandchild, MPT Physical Therapist with [facility] 336 [phone] office [phone] mobile phone

## 2021-10-06 NOTE — ED Notes (Addendum)
Pending transport to [facility] by ED staff

## 2021-10-06 NOTE — ED Notes (Signed)
Sleeping in recliner since PT. Daughter at [facility].

## 2021-10-06 NOTE — ED Provider Notes (Signed)
Emergency Medicine Observation Re-evaluation Note  ENCARNACION SCIONEAUX is a 88 y.o. male, seen on rounds today.  Pt initially presented to the ED for complaints of Hip Pain Currently, the patient is sleeping.  Physical Exam  BP (!) 101/59   Pulse 78   Temp 97.7 F (36.5 C) (Oral)   Resp 13   Ht '6\' 4"'$  (1.93 m)   Wt 58 kg   SpO2 91%   BMI 15.56 kg/m  Physical Exam General: Sleeping, nondistressed Cardiac: Extremities well-perfused, normal heart rate Lungs: Breathing even and unlabored Psych: Deferred  ED Course / MDM  EKG:   I have reviewed the labs performed to date as well as medications administered while in observation.  Recent changes in the last 24 hours include patient presented to the ED yesterday.  He had a recent hospitalization and was discharged 2 days ago.  Initially family wanted him back home.  They have since refused to take him back home and we are now waiting on nursing facility placement.  Currently awaiting TOC consult and PT evaluation.  Plan  Current plan is for nursing facility placement.  [facility] is not under involuntary commitment.     Godfrey Pick, MD 10/06/21 [phone]

## 2021-10-07 ENCOUNTER — Non-Acute Institutional Stay (SKILLED_NURSING_FACILITY): Payer: Medicare Other | Admitting: Adult Health

## 2021-10-07 ENCOUNTER — Telehealth (HOSPITAL_COMMUNITY): Payer: Self-pay | Admitting: *Deleted

## 2021-10-07 ENCOUNTER — Other Ambulatory Visit: Payer: Self-pay | Admitting: Adult Health

## 2021-10-07 ENCOUNTER — Other Ambulatory Visit (HOSPITAL_COMMUNITY): Payer: Self-pay | Admitting: Adult Health

## 2021-10-07 ENCOUNTER — Ambulatory Visit (HOSPITAL_COMMUNITY)
Admission: RE | Admit: 2021-10-07 | Discharge: 2021-10-07 | Disposition: A | Discharge status: Home / Self Care | Payer: Medicare Other | Source: Ambulatory Visit | Attending: Adult Health | Admitting: Adult Health

## 2021-10-07 ENCOUNTER — Encounter: Payer: Self-pay | Admitting: Adult Health

## 2021-10-07 DIAGNOSIS — I9589 Other hypotension: Secondary | ICD-10-CM | POA: Diagnosis not present

## 2021-10-07 DIAGNOSIS — J439 Emphysema, unspecified: Secondary | ICD-10-CM | POA: Diagnosis not present

## 2021-10-07 DIAGNOSIS — T148XXA Other injury of unspecified body region, initial encounter: Secondary | ICD-10-CM

## 2021-10-07 DIAGNOSIS — D62 Acute posthemorrhagic anemia: Secondary | ICD-10-CM | POA: Diagnosis not present

## 2021-10-07 DIAGNOSIS — D469 Myelodysplastic syndrome, unspecified: Secondary | ICD-10-CM | POA: Diagnosis not present

## 2021-10-07 DIAGNOSIS — M25552 Pain in left hip: Secondary | ICD-10-CM

## 2021-10-07 DIAGNOSIS — D696 Thrombocytopenia, unspecified: Secondary | ICD-10-CM

## 2021-10-07 DIAGNOSIS — I700 Atherosclerosis of aorta: Secondary | ICD-10-CM | POA: Diagnosis not present

## 2021-10-07 DIAGNOSIS — N1832 Chronic kidney disease, stage 3b: Secondary | ICD-10-CM | POA: Diagnosis not present

## 2021-10-07 MED ORDER — OXYCODONE HCL 5 MG PO TABS: 5.0000 mg | ORAL_TABLET | Freq: Four times a day (QID) | ORAL | 0 refills | Status: DC | PRN

## 2021-10-07 MED ORDER — TRAMADOL HCL 50 MG PO TABS: 50.0000 mg | ORAL_TABLET | Freq: Three times a day (TID) | ORAL | 0 refills | Status: DC | PRN

## 2021-10-07 NOTE — Telephone Encounter (Signed)
[name] at the [facility] was contacted by telephone to verify understanding of discharge instructions status post their most recent discharge from the hospital on the date:  10/06/21.  Inpatient discharge AVS was re-reviewed with patient, along with cancer center appointments.  Verification of understanding for oncology specific follow-up was validated using the Teach Back method.  Went  over appointments for 5/30 and 6/5.  Transportation to appointments were confirmed for the patient as being  [facility] .  [name]'s questions were addressed to their satisfaction upon completion of this post discharge follow-up call for outpatient oncology.

## 2021-10-07 NOTE — Progress Notes (Unsigned)
Location:  [facility] Room Number: I/347/Q   Place of Service:   SNF(31); Gerlene Fee., NP    CODE STATUS: DNR  No Known Allergies  Chief Complaint  Patient presents with   Hospitalization Follow-up    Patient is here for follow up after hospital stay     HPI:  He is a 88 year old man who has been hospitalized. His medical history includes: steroid dependent COPD; ckd stage 3; chronic hypotension. He was initially hospitalized from 09-29-21 through 10-03-21 after a fall when he suffered a left gluteal hematoma. He required multiple transfusion of PRBC and platelets to maintain blood counts. His care was guided by remote consultation with his hematologist Dr. Tera Helper. He went to another SNF for rehab. He then presented to the ED on 10-05-21 for pain management. He is here for short term rehab with his goal to return back home. He is having left leg pain from his hematoma. He will continue to be followed for his chronic illnesses including: Myelodysplastic syndrome/thrombocytopenia:  Aortic atherosclerosis Pulmonary emphysema unspecified emphysema type:  Stage 3b chronic kidney disease:    Past Medical History:  Diagnosis Date   Bone cancer ([place])    Chronic kidney insufficiency    COPD (chronic obstructive pulmonary disease) (HCC)    GERD (gastroesophageal reflux disease)    Pulmonary nodule     Past Surgical History:  Procedure Laterality Date   BACK SURGERY     BIOPSY  07/15/2021   Procedure: BIOPSY;  Surgeon: Harvel Quale, MD;  [facility];  Service: Gastroenterology;;   CATARACT EXTRACTION W/PHACO Right 03/09/2016   Procedure: CATARACT EXTRACTION PHACO AND INTRAOCULAR LENS PLACEMENT RIGHT EYE CDE=8.58;  Surgeon: Tonny Branch, MD;  Location: [facility];  Service: Ophthalmology;  Laterality: Right;  right   CATARACT EXTRACTION W/PHACO Left 04/13/2016   Procedure: CATARACT EXTRACTION PHACO AND INTRAOCULAR LENS PLACEMENT (IOC);   Surgeon: Tonny Branch, MD;  Location: [facility];  Service: Ophthalmology;  Laterality: Left;  CDE: 8.08   ENTEROSCOPY  07/23/2021   Procedure: ENTEROSCOPY;  Surgeon: Harvel Quale, MD;  [facility];  Service: Gastroenterology;;   ESOPHAGOGASTRODUODENOSCOPY (EGD) WITH PROPOFOL N/A 07/14/2021   Procedure: ESOPHAGOGASTRODUODENOSCOPY (EGD) WITH PROPOFOL;  Surgeon: Eloise Harman, DO;  [facility];  Service: Endoscopy;  Laterality: N/A;   ESOPHAGOGASTRODUODENOSCOPY (EGD) WITH PROPOFOL N/A 07/15/2021   Procedure: ESOPHAGOGASTRODUODENOSCOPY (EGD) WITH PROPOFOL;  Surgeon: Harvel Quale, MD;  [facility];  Service: Gastroenterology;  Laterality: N/A;   ESOPHAGOGASTRODUODENOSCOPY (EGD) WITH PROPOFOL N/A 07/23/2021   Procedure: ESOPHAGOGASTRODUODENOSCOPY (EGD) WITH PROPOFOL;  Surgeon: Harvel Quale, MD;  [facility];  Service: Gastroenterology;  Laterality: N/A;   HEMOSTASIS CLIP PLACEMENT  07/15/2021   Procedure: HEMOSTASIS CLIP PLACEMENT;  Surgeon: Harvel Quale, MD;  [facility];  Service: Gastroenterology;;   HEMOSTASIS CLIP PLACEMENT  07/23/2021   Procedure: HEMOSTASIS CLIP PLACEMENT;  Surgeon: Harvel Quale, MD;  [facility];  Service: Gastroenterology;;   KYPHOPLASTY Bilateral 06/09/2019   Procedure: T11 KYPHOPLASTY;  Surgeon: Consuella Lose, MD;  Location: [facility];  Service: Neurosurgery;  Laterality: Bilateral;   None to Date  10/01/15   [facility] REMOVAL Left 08/07/2021   Procedure: MINOR REMOVAL PORT-A-CATH;  Surgeon: Virl Cagey, MD;  Location: [facility];  Service: General;  Laterality: Left;  lidocaine 1%   PORTACATH PLACEMENT Left 10/11/2020   Procedure: INSERTION PORT-A-CATH (attached catheter in left  internal jugular);  Surgeon: Virl Cagey, MD;  Location: [facility];  Service: General;  Laterality: Left;   PORTACATH PLACEMENT Right 10/02/2021   Procedure: INSERTION  PORT-A-CATH;  Surgeon: Virl Cagey, MD;  Location[facility];  Service: General;  Laterality: Right;    Social History   Socioeconomic History   Marital status: Widowed    Spouse name: Not on file   Number of children: Not on file   Years of education: Not on file   Highest education level: Not on file  Occupational History   Not on file  Tobacco Use   Smoking status: Former    Years: 15.00    Types: Cigarettes, Cigars    Quit date: 10/01/1990    Years since quitting: 31.0   Smokeless tobacco: Never   Tobacco comments:    Quit x 25-30 years; 2 cigars daily when smoked  Vaping Use   Vaping Use: Never used  Substance and Sexual Activity   Alcohol use: Not Currently    Alcohol/week: 0.0 standard drinks   Drug use: No   Sexual activity: Never    Birth control/protection: None  Other Topics Concern   Not on file  Social History Narrative   Not on file   Social Determinants of Health   Financial Resource Strain: Not on file  Food Insecurity: Not on file  Transportation Needs: Not on file  Physical Activity: Not on file  Stress: Not on file  Social Connections: Not on file  Intimate Partner Violence: Not on file   Family History  Problem Relation Age of Onset   Kidney disease Mother    Kidney disease Sister    Colon cancer Neg Hx    Gastric cancer Neg Hx    Esophageal cancer Neg Hx       VITAL SIGNS BP 127/61   Pulse 82   Temp (!) 97.4 F (36.3 C)   Resp 18   Ht '6\' 2"'$  (1.88 m)   Wt 120 lb 12.8 oz (54.8 kg)   SpO2 96%   BMI 15.51 kg/m   Outpatient Encounter Medications as of 10/07/2021  Medication Sig   acetaminophen (TYLENOL) 650 MG CR tablet Take 650 mg by mouth every 6 (six) hours as needed for pain. Patient taking PO every 6 hours   albuterol (VENTOLIN HFA) 108 (90 Base) MCG/ACT inhaler Inhale 2 puffs into the lungs every 4 (four) hours as needed for wheezing or shortness of breath.   midodrine (PROAMATINE) 5 MG tablet Take 1 tablet (5 mg total)  by mouth 3 (three) times daily with meals.   oxyCODONE (OXY IR/ROXICODONE) 5 MG immediate release tablet Take 1 tablet (5 mg total) by mouth every 6 (six) hours as needed for severe pain.   predniSONE (DELTASONE) 5 MG tablet Take 5 mg by mouth daily with breakfast.   traMADol (ULTRAM) 50 MG tablet Take 1 tablet (50 mg total) by mouth every 8 (eight) hours as needed for moderate pain.   umeclidinium-vilanterol (ANORO ELLIPTA) 62.5-25 MCG/ACT AEPB Inhale 1 puff into the lungs daily.   Facility-Administered Encounter Medications as of 10/07/2021  Medication   0.9 %  sodium chloride infusion   octreotide (SANDOSTATIN LAR) 30 MG IM injection   sodium chloride flush (NS) 0.9 % injection 10 mL   sodium chloride flush (NS) 0.9 % injection 10 mL   sodium chloride flush (NS) 0.9 % injection 10 mL     SIGNIFICANT DIAGNOSTIC EXAMS  TODAY  09-29-21: wbc 17.9 hgb  6.9; hct 21.7; mcv 90.8 plt 32; glucose 92; bun 38; creat 2.33; k+ 3.9; na++ 136 ca 8.6; gfr 26; protein 6.6 albumin 3.0 09-30-21: wbc 16.2; hgb 5.6; hct 17.8; mcv 90.4 plt 60; glucose 103; bun 36; creat 1.83; k+ 3.6; na++ 136; ca 7.6; gfr 35 10-03-21: wbc 17.7; hgb 8.1; hct 26.0; mcv 86.1 plt 56; glucose 87; bun 24; creat 1.78; k+ 3.9; na++ 137; ca 8.5; gfr 26 10-05-21: wbc 15.5; hgb 8.9; hct 27.6; mcv 85.4 plt 54; glucose 105; bun 26; creat 1.56; k+ 4.0; na++ 132; ca 8.8; gfr 42    Review of Systems  Constitutional:  Negative for malaise/fatigue.  Respiratory:  Negative for cough and shortness of breath.   Cardiovascular:  Negative for chest pain, palpitations and leg swelling.  Gastrointestinal:  Negative for abdominal pain, constipation and heartburn.  Musculoskeletal:  Positive for joint pain and myalgias. Negative for back pain.       On left side   Skin: Negative.   Neurological:  Negative for dizziness.  Psychiatric/Behavioral:  The patient is not nervous/anxious.     Physical Exam Constitutional:      General: He is not in  acute distress.    Appearance: He is underweight. He is not diaphoretic.  Neck:     Thyroid: No thyromegaly.  Cardiovascular:     Rate and Rhythm: Normal rate and regular rhythm.     Pulses: Normal pulses.     Heart sounds: Normal heart sounds.  Pulmonary:     Effort: Pulmonary effort is normal. No respiratory distress.     Breath sounds: Normal breath sounds.  Abdominal:     General: Bowel sounds are normal. There is no distension.     Palpations: Abdomen is soft.     Tenderness: There is no abdominal tenderness.  Musculoskeletal:        General: Normal range of motion.     Cervical back: Neck supple.     Right lower leg: No edema.     Left lower leg: No edema.     Comments: Hematoma left gluteal Bruising left side   Lymphadenopathy:     Cervical: No cervical adenopathy.  Skin:    General: Skin is warm and dry.  Neurological:     Mental Status: He is alert and oriented to person, place, and time.  Psychiatric:        Mood and Affect: Mood normal.     ASSESSMENT/ PLAN:  TODAY  Myelodysplastic syndrome/thrombocytopenia: is followed by hematology; has received transfusions of PRBC and platelets  2.   Aortic atherosclerosis (ct 09-29-21) monitor   3. Pulmonary emphysema unspecified emphysema type: will continue anoro 62.5-25 mcg 1 puff daily has albuterol 2 puffs every 4 hours as needed; prednisone 5 mg daily   4. Stage 3b chronic kidney disease: bun 26; creat 1.56; gfr 42  5. Chronic hypotension: b/p 127/61: will continue midodrine 5 mg three times daily   6. Hematoma: left gluteal; has oxycodone 5 mg every 6 hours as needed and ultram 50 mg every 6 hours as needed.   Will check cbc; on Mondays and Thursday       Simonne Boulos NP [facility]   call [phone]

## 2021-10-08 ENCOUNTER — Encounter (HOSPITAL_COMMUNITY)
Admission: RE | Admit: 2021-10-08 | Discharge: 2021-10-08 | Disposition: A | Discharge status: Home / Self Care | Payer: Medicare Other | Source: Skilled Nursing Facility | Attending: Adult Health | Admitting: Adult Health

## 2021-10-08 ENCOUNTER — Non-Acute Institutional Stay (SKILLED_NURSING_FACILITY): Payer: Medicare Other | Admitting: Internal Medicine

## 2021-10-08 ENCOUNTER — Encounter: Payer: Self-pay | Admitting: Internal Medicine

## 2021-10-08 DIAGNOSIS — E43 Unspecified severe protein-calorie malnutrition: Secondary | ICD-10-CM

## 2021-10-08 DIAGNOSIS — L089 Local infection of the skin and subcutaneous tissue, unspecified: Secondary | ICD-10-CM | POA: Insufficient documentation

## 2021-10-08 DIAGNOSIS — I700 Atherosclerosis of aorta: Secondary | ICD-10-CM | POA: Insufficient documentation

## 2021-10-08 DIAGNOSIS — D469 Myelodysplastic syndrome, unspecified: Secondary | ICD-10-CM | POA: Diagnosis not present

## 2021-10-08 DIAGNOSIS — T148XXA Other injury of unspecified body region, initial encounter: Secondary | ICD-10-CM | POA: Insufficient documentation

## 2021-10-08 DIAGNOSIS — I9589 Other hypotension: Secondary | ICD-10-CM

## 2021-10-08 DIAGNOSIS — D696 Thrombocytopenia, unspecified: Secondary | ICD-10-CM

## 2021-10-08 DIAGNOSIS — N1832 Chronic kidney disease, stage 3b: Secondary | ICD-10-CM | POA: Diagnosis not present

## 2021-10-08 LAB — HEMOGLOBIN AND HEMATOCRIT, BLOOD
HCT: 22.1 % — ABNORMAL LOW (ref 39.0–52.0)
Hemoglobin: 7.1 g/dL — ABNORMAL LOW (ref 13.0–17.0)

## 2021-10-08 NOTE — Patient Instructions (Signed)
See assessment and plan under each diagnosis in the problem list and acutely for this visit 

## 2021-10-08 NOTE — Assessment & Plan Note (Signed)
Continue midodrine and monitor blood pressure at the SNF to include postural blood pressures.

## 2021-10-08 NOTE — Assessment & Plan Note (Signed)
Current H/H 8.9/27.6 with platelet count 54,000.  Monitor CBC every Monday and Thursday while at the SNF.  No active bleeding dyscrasias identified at this time. He is DNR; Hospice is consideration as off chemo.

## 2021-10-08 NOTE — Progress Notes (Unsigned)
NURSING HOME LOCATION:  [facility] ROOM NUMBER:  [id number]  CODE STATUS:  DNR  PCP: Sharilyn Sites, MD  This is a comprehensive admission note to this SNFperformed on this date less than 30 days from date of admission. Included are preadmission medical/surgical history; reconciled medication list; family history; social history and comprehensive review of systems.  Corrections and additions to the records were documented. Comprehensive physical exam was also performed. Additionally a clinical summary was entered for each active diagnosis pertinent to this admission in the Problem List to enhance continuity of care.  HPI: Originally he was hospitalized 5/11 - 5/13, referred to the ED by his PCP for weakness and hypotension.  He has chronic hypotension and is on midodrine.On 5/11 he felt his left knee "buckle" resulting in a fall onto his left side resulting in significant gluteal hematoma.  He had gone to see his PCP to discuss the fall; systolic blood pressures in the 90s was documented.  This finding and subjective lightheadedness and dizziness prompted referral to the ED. PTA Hgb had been 8.1 but at admission H/H was 6.2/19.6 with 41,000 platelets.  Nadir H/H was 5.6/17.8 with a platelet count of 27,000.  There was no report of any bleeding dyscrasias.  He received 2 units packed cells with resulting hemoglobin of 8.2.  CT of the abdomen revealed no intra-abdominal or retroperitoneal bleeding.  He presented with AKI with a creatinine of 2.46: This improved to 2.1 with fluid resuscitation.  Albumin was 2.6 and total protein 5.4.  Clinically he was felt stable enough to return home.  He has MDS and his blood counts are checked every Monday with transfusion as indicated by protocol of hemoglobin less than 8 and platelet count less than 50,000.    He had been rehospitalized 5/15 - 10/03/2021.  He had awakened at approximately midnight 5/15 feeling lightheaded and as if he were going to  pass out.  He went back to sleep but when he attempted to go to the bathroom he felt lightheaded and fell backwards landing on his buttocks.  Patient had not taken his midodrine on the morning of admission.  In the ED blood pressure was 70/30; 500 mL of NS was administered with resultant blood pressure of 90/60.  Creatinine was 2.33 & GFR  26.  H/H was 6.9/21.7 and platelet count 32,000.  Dr. Delton Coombes performed remote consultation during the hospitalization. Discharge H/H was 8.1/26 with platelet count of 56,000.  Creatinine was 1.78 with a GFR of 36 indicating CKD stage IIIb. He was having difficulty with ambulation and weightbearing on the LLE.  PT/OT recommended facility placement for rehab.  His port site was changed 5/18 by General Surgery prior to discharge to another SNF. His daughter was dissatisfied with his care at the SNF and brought him back to the ED 5/21.  H/H was 8.9/27.6 with a platelet count of 54,000.  Creatinine was 1.56 with a GFR of 42 indicating high stage IIIb CKD.  He was discharged to this facility for PT/OT at the request of his daughter.  Past medical and surgical history: Includes myelodysplasia syndrome ,COPD, GERD, chronic hypotension on midodrine, and CKD.  Surgeries and procedures include EGD x3; hemostasis clip placement; and kyphoplasty.  Social history: Nondrinker at present; former cigar smoker.  Family history: non contributory due to advanced age.   Review of systems: He was slightly lethargic but was able to communicate well.  He was cognizant of the events surrounding his hospitalization and  subsequent process.  He has had myelodysplasia since 2019.  This has been complicated by periesophageal and small bowel bleeding.  He was on chemotherapy until January of this year. He continues to have left-sided weakness and pain, mainly in the left hip. Shortness of breath is chronic related to his COPD.  Constitutional: No fever, significant weight change  Eyes: No  redness, discharge, pain, vision change ENT/mouth: No nasal congestion, purulent discharge, earache, change in hearing, sore throat  Cardiovascular: No chest pain, palpitations, paroxysmal nocturnal dyspnea, claudication, edema  Respiratory: No cough, sputum production, hemoptysis, significant snoring, apnea  Gastrointestinal: No heartburn, dysphagia, abdominal pain, nausea /vomiting, rectal bleeding, melena, change in bowels Genitourinary: No dysuria, hematuria, pyuria, incontinence, nocturia Dermatologic: No rash, pruritus, change in appearance of skin beyond extensive L sided ecchymoses. Neurologic: No dizziness, headache, syncope, seizures, numbness, tingling @ present Psychiatric: No significant anxiety, depression, insomnia, anorexia Endocrine: No change in hair/skin/nails, excessive thirst, excessive hunger, excessive urination  Hematologic/lymphatic: No significant lymphadenopathy, abnormal bleeding Allergy/immunology: No itchy/watery eyes, significant sneezing, urticaria, angioedema  Physical exam:  Pertinent or positive findings: He is profoundly thin and almost cachectic.  The left inferior nasolabial fold is decreased.  He has an upper plate & a lower partial.  Access port is present at the base of the right neck.  First heart sound is accentuated.  He has coarse rales in the lower one third of the posterior thorax.  Pedal pulses are decreased.  There is marked limb atrophy as well as interosseous wasting.  He has bruising over the hands.  There is a large hematoma at the left flank.  General appearance:  no acute distress, increased work of breathing is present.   Lymphatic: No lymphadenopathy about the head, neck, axilla. Eyes: No conjunctival inflammation or lid edema is present. There is no scleral icterus. Ears:  External ear exam shows no significant lesions or deformities.   Nose:  External nasal examination shows no deformity or inflammation. Nasal mucosa are pink and moist  without lesions, exudates Neck:  No thyromegaly, masses, tenderness noted.    Heart:  Normal rate and regular rhythm. S2 normal without gallop, murmur, click, rub.  Lungs:  without wheezes, rhonchi, rubs. Abdomen: Bowel sounds are normal.  Abdomen is soft and nontender with no organomegaly, hernias, masses. GU: Deferred  Extremities:  No cyanosis, clubbing, edema. Neurologic exam:  Balance, Rhomberg, finger to nose testing could not be completed due to clinical state Skin: Warm & dry w/o tenting. No significant rash.  See clinical summary under each active problem in the Problem List with associated updated therapeutic plan

## 2021-10-08 NOTE — Assessment & Plan Note (Signed)
Current albumin 2.6 and total protein 5.4.  Profound protein/caloric malnutrition suggested by labs and physical.  Marked limb atrophy and very low BMI. Nutritionist to consult at SNF.

## 2021-10-08 NOTE — Assessment & Plan Note (Signed)
Current creatinine is 1.56 with a GFR 42 indicating high stage IIIb CKD.  Med list reviewed; no medication modification indicated unless there is progression of CKD.

## 2021-10-08 NOTE — Assessment & Plan Note (Signed)
Current platelet count is 54,000, above the goal of 50,000.  Monitor with CBC Monday and Thursday while at the SNF.

## 2021-10-09 ENCOUNTER — Encounter (HOSPITAL_COMMUNITY)
Admission: RE | Admit: 2021-10-09 | Discharge: 2021-10-09 | Disposition: A | Discharge status: Home / Self Care | Payer: Medicare Other | Source: Skilled Nursing Facility | Attending: Adult Health | Admitting: Adult Health

## 2021-10-09 LAB — CBC WITH DIFFERENTIAL/PLATELET
Basophils Relative: 0 %
Eosinophils Relative: 0 %
HCT: 23.3 % — ABNORMAL LOW (ref 39.0–52.0)
Hemoglobin: 7.4 g/dL — ABNORMAL LOW (ref 13.0–17.0)
Lymphocytes Relative: 12 %
MCH: 27.4 pg (ref 26.0–34.0)
MCHC: 31.8 g/dL (ref 30.0–36.0)
MCV: 86.3 fL (ref 80.0–100.0)
Monocytes Relative: 16 %
Neutrophils Relative %: 72 %
Platelets: 41 10*3/uL — ABNORMAL LOW (ref 150–400)
RBC: 2.7 MIL/uL — ABNORMAL LOW (ref 4.22–5.81)
RDW: 19.9 % — ABNORMAL HIGH (ref 11.5–15.5)
WBC: 21 10*3/uL — ABNORMAL HIGH (ref 4.0–10.5)
nRBC: 1.2 % — ABNORMAL HIGH (ref 0.0–0.2)
nRBC: 2 /100 WBC — ABNORMAL HIGH

## 2021-10-10 ENCOUNTER — Ambulatory Visit (HOSPITAL_COMMUNITY)
Admission: RE | Admit: 2021-10-10 | Discharge: 2021-10-10 | Disposition: A | Discharge status: Home / Self Care | Payer: Medicare Other | Source: Ambulatory Visit | Attending: Internal Medicine | Admitting: Internal Medicine

## 2021-10-10 DIAGNOSIS — R918 Other nonspecific abnormal finding of lung field: Secondary | ICD-10-CM | POA: Diagnosis not present

## 2021-10-10 DIAGNOSIS — I700 Atherosclerosis of aorta: Secondary | ICD-10-CM | POA: Diagnosis not present

## 2021-10-10 DIAGNOSIS — J432 Centrilobular emphysema: Secondary | ICD-10-CM | POA: Diagnosis not present

## 2021-10-10 DIAGNOSIS — I2510 Atherosclerotic heart disease of native coronary artery without angina pectoris: Secondary | ICD-10-CM | POA: Diagnosis not present

## 2021-10-10 LAB — PATHOLOGIST SMEAR REVIEW

## 2021-10-12 ENCOUNTER — Other Ambulatory Visit: Payer: Self-pay

## 2021-10-12 ENCOUNTER — Encounter (HOSPITAL_COMMUNITY): Payer: Self-pay

## 2021-10-12 ENCOUNTER — Encounter (HOSPITAL_COMMUNITY)
Admission: RE | Admit: 2021-10-12 | Discharge: 2021-10-12 | Disposition: A | Discharge status: Home / Self Care | Payer: Medicare Other | Source: Skilled Nursing Facility | Attending: Adult Health | Admitting: Adult Health

## 2021-10-12 ENCOUNTER — Emergency Department (HOSPITAL_COMMUNITY): Payer: Medicare Other

## 2021-10-12 ENCOUNTER — Emergency Department (HOSPITAL_COMMUNITY)
Admission: EM | Admit: 2021-10-12 | Discharge: 2021-10-12 | Disposition: A | Discharge status: Skilled Nursing Facility | Payer: Medicare Other | Attending: Emergency Medicine | Admitting: Emergency Medicine

## 2021-10-12 DIAGNOSIS — N189 Chronic kidney disease, unspecified: Secondary | ICD-10-CM | POA: Insufficient documentation

## 2021-10-12 DIAGNOSIS — D72829 Elevated white blood cell count, unspecified: Secondary | ICD-10-CM | POA: Diagnosis not present

## 2021-10-12 DIAGNOSIS — E860 Dehydration: Secondary | ICD-10-CM | POA: Insufficient documentation

## 2021-10-12 DIAGNOSIS — Z7952 Long term (current) use of systemic steroids: Secondary | ICD-10-CM | POA: Insufficient documentation

## 2021-10-12 DIAGNOSIS — R0602 Shortness of breath: Secondary | ICD-10-CM | POA: Diagnosis not present

## 2021-10-12 DIAGNOSIS — D649 Anemia, unspecified: Secondary | ICD-10-CM | POA: Diagnosis not present

## 2021-10-12 DIAGNOSIS — D500 Iron deficiency anemia secondary to blood loss (chronic): Secondary | ICD-10-CM | POA: Diagnosis not present

## 2021-10-12 DIAGNOSIS — R0789 Other chest pain: Secondary | ICD-10-CM | POA: Insufficient documentation

## 2021-10-12 DIAGNOSIS — D696 Thrombocytopenia, unspecified: Secondary | ICD-10-CM | POA: Insufficient documentation

## 2021-10-12 DIAGNOSIS — J449 Chronic obstructive pulmonary disease, unspecified: Secondary | ICD-10-CM | POA: Insufficient documentation

## 2021-10-12 DIAGNOSIS — R531 Weakness: Secondary | ICD-10-CM

## 2021-10-12 LAB — COMPREHENSIVE METABOLIC PANEL
ALT: 33 U/L (ref 0–44)
AST: 58 U/L — ABNORMAL HIGH (ref 15–41)
Albumin: 3.1 g/dL — ABNORMAL LOW (ref 3.5–5.0)
Alkaline Phosphatase: 86 U/L (ref 38–126)
Anion gap: 6 (ref 5–15)
BUN: 44 mg/dL — ABNORMAL HIGH (ref 8–23)
CO2: 25 mmol/L (ref 22–32)
Calcium: 8.9 mg/dL (ref 8.9–10.3)
Chloride: 106 mmol/L (ref 98–111)
Creatinine, Ser: 2.36 mg/dL — ABNORMAL HIGH (ref 0.61–1.24)
GFR, Estimated: 26 mL/min — ABNORMAL LOW (ref >60)
Glucose, Bld: 101 mg/dL — ABNORMAL HIGH (ref 70–99)
Potassium: 4.2 mmol/L (ref 3.5–5.1)
Sodium: 137 mmol/L (ref 135–145)
Total Bilirubin: 1.7 mg/dL — ABNORMAL HIGH (ref 0.3–1.2)
Total Protein: 7.2 g/dL (ref 6.5–8.1)

## 2021-10-12 LAB — CBC WITH DIFFERENTIAL/PLATELET
Basophils Absolute: 0 10*3/uL (ref 0.0–0.1)
Basophils Absolute: 0 10*3/uL (ref 0.0–0.1)
Basophils Relative: 0 %
Basophils Relative: 0 %
Eosinophils Absolute: 0 10*3/uL (ref 0.0–0.5)
Eosinophils Absolute: 0 10*3/uL (ref 0.0–0.5)
Eosinophils Relative: 0 %
Eosinophils Relative: 0 %
HCT: 21.6 % — ABNORMAL LOW (ref 39.0–52.0)
HCT: 19.8 % — ABNORMAL LOW (ref 39.0–52.0)
Hemoglobin: 6.8 g/dL — CL (ref 13.0–17.0)
Hemoglobin: 6.3 g/dL — CL (ref 13.0–17.0)
Lymphocytes Relative: 16 %
Lymphocytes Relative: 17 %
Lymphs Abs: 3.3 10*3/uL (ref 0.7–4.0)
Lymphs Abs: 3.9 10*3/uL (ref 0.7–4.0)
MCH: 26.9 pg (ref 26.0–34.0)
MCH: 26.7 pg (ref 26.0–34.0)
MCHC: 31.5 g/dL (ref 30.0–36.0)
MCHC: 31.8 g/dL (ref 30.0–36.0)
MCV: 85.4 fL (ref 80.0–100.0)
MCV: 83.9 fL (ref 80.0–100.0)
Metamyelocytes Relative: 7 %
Metamyelocytes Relative: 6 %
Monocytes Absolute: 1.9 10*3/uL — ABNORMAL HIGH (ref 0.1–1.0)
Monocytes Absolute: 3.5 10*3/uL — ABNORMAL HIGH (ref 0.1–1.0)
Monocytes Relative: 9 %
Monocytes Relative: 15 %
Myelocytes: 5 %
Myelocytes: 4 %
Neutro Abs: 11.7 10*3/uL — ABNORMAL HIGH (ref 1.7–7.7)
Neutro Abs: 11.5 10*3/uL — ABNORMAL HIGH (ref 1.7–7.7)
Neutrophils Relative %: 57 %
Neutrophils Relative %: 50 %
Platelets: 41 10*3/uL — ABNORMAL LOW (ref 150–400)
Platelets: 45 10*3/uL — ABNORMAL LOW (ref 150–400)
Promyelocytes Relative: 6 %
Promyelocytes Relative: 8 %
RBC: 2.53 MIL/uL — ABNORMAL LOW (ref 4.22–5.81)
RBC: 2.36 MIL/uL — ABNORMAL LOW (ref 4.22–5.81)
RDW: 20.4 % — ABNORMAL HIGH (ref 11.5–15.5)
RDW: 20.4 % — ABNORMAL HIGH (ref 11.5–15.5)
WBC: 20.6 10*3/uL — ABNORMAL HIGH (ref 4.0–10.5)
WBC: 23 10*3/uL — ABNORMAL HIGH (ref 4.0–10.5)
nRBC: 1.5 % — ABNORMAL HIGH (ref 0.0–0.2)
nRBC: 1 % — ABNORMAL HIGH (ref 0.0–0.2)

## 2021-10-12 LAB — URINALYSIS, ROUTINE W REFLEX MICROSCOPIC
Bacteria, UA: NONE SEEN
Bilirubin Urine: NEGATIVE
Glucose, UA: NEGATIVE mg/dL
Ketones, ur: NEGATIVE mg/dL
Leukocytes,Ua: NEGATIVE
Nitrite: NEGATIVE
Protein, ur: 100 mg/dL — AB
Specific Gravity, Urine: 1.013 (ref 1.005–1.030)
pH: 7 (ref 5.0–8.0)

## 2021-10-12 LAB — PREPARE RBC (CROSSMATCH)

## 2021-10-12 LAB — TROPONIN I (HIGH SENSITIVITY)
Troponin I (High Sensitivity): 8 ng/L (ref <18)
Troponin I (High Sensitivity): 7 ng/L (ref <18)

## 2021-10-12 LAB — LACTIC ACID, PLASMA
Lactic Acid, Venous: 2.6 mmol/L (ref 0.5–1.9)
Lactic Acid, Venous: 1.7 mmol/L (ref 0.5–1.9)

## 2021-10-12 LAB — MAGNESIUM: Magnesium: 2 mg/dL (ref 1.7–2.4)

## 2021-10-12 LAB — PROTIME-INR
INR: 1.1 (ref 0.8–1.2)
Prothrombin Time: 14.2 seconds (ref 11.4–15.2)

## 2021-10-12 MED ORDER — SODIUM CHLORIDE 0.9 % IV SOLN
10.0000 mL/h | Freq: Once | INTRAVENOUS | Status: AC
Start: 2021-10-12 — End: 2021-10-12
  Administered 2021-10-12: 10 mL/h via INTRAVENOUS

## 2021-10-12 MED ORDER — LACTATED RINGERS IV BOLUS (SEPSIS)
500.0000 mL | Freq: Once | INTRAVENOUS | Status: AC
  Administered 2021-10-12: 500 mL via INTRAVENOUS

## 2021-10-12 NOTE — ED Notes (Signed)
PT is a hard stick

## 2021-10-12 NOTE — ED Triage Notes (Signed)
Patient reports generalized weakness and new onset of SHOB last night. Hgb at facility 6.8. Patient was scheduled for transfusion Tuesday. Currently diagnosed with cancer. Had recent port placement to right chest, noted with redness and swelling to site.

## 2021-10-12 NOTE — ED Notes (Signed)
Lab contacted re: need for labs d/t difficulty with IV access, pt has #24 in the L thumb for IV access with reported hemoglobin as 6.3

## 2021-10-12 NOTE — ED Notes (Signed)
MD made aware of need for [place] IV

## 2021-10-12 NOTE — ED Provider Notes (Signed)
[facility] Provider Note   CSN: [id number] Arrival date & time: 10/12/21  1003     History  Chief Complaint  Patient presents with   Weakness    [name] is a 88 y.o. male.  HPI Patient presents for fatigue and generalized weakness.  Medical history includes GERD, anemia,, cytopenia, COPD, CKD, MDS, BPH, osteoporosis.  He was hospitalized 2.5 weeks ago.  During that time, he did receive 2 units PRBCs for acute on chronic anemia.  He was rehospitalized 2 weeks ago for near syncope and fall.  He was discharged to a skilled nursing facility.  Patient was brought back to the ED 1 week ago due to dissatisfaction with care at [facility].  He was discharged from the ED to have nursing facility 4 days ago.  Since he left the ED, he has had some good days where his fatigue has been minimal.  He has been working with PT and has gotten onto his feet.  Due to his MDS, he does get frequent blood transfusions.  Typically they will transfuse him below a hemoglobin threshold of 7.  Over the past several days, it has been in the range of 7.1-7.4.  Today it was 6.8.  Patient reports that when he woke up this morning, at 3 AM, he felt well.  When he went to get himself dressed, he experienced severe fatigue which has persisted since that time.  He does experience exertional shortness of breath with minimal effort.  He recently had a port in the left side of his chest which was removed due to concern of infection.  10 days ago, he had a port placed in the right area of his chest.  This port has not been accessed thus far.  He has had swelling and erythema overlying this new port.  He does report worsened pain in this area.  Localized pain is worsened with palpation and movement of his right arm.  Patient denies any other areas of discomfort.    Home Medications Prior to Admission medications   Medication Sig Start Date End Date Taking? Authorizing Provider  acetaminophen (TYLENOL) 650 MG  CR tablet Take 650 mg by mouth every 6 (six) hours.   Yes [provider]  albuterol (VENTOLIN HFA) 108 (90 Base) MCG/ACT inhaler Inhale 2 puffs into the lungs every 4 (four) hours as needed for wheezing or shortness of breath. 04/04/20  Yes Emokpae, Courage, MD  midodrine (PROAMATINE) 5 MG tablet Take 1 tablet (5 mg total) by mouth 3 (three) times daily with meals. 09/21/21  Yes Dessa Phi, DO  oxyCODONE (OXY IR/ROXICODONE) 5 MG immediate release tablet Take 1 tablet (5 mg total) by mouth every 6 (six) hours as needed for severe pain. 10/07/21  Yes Gerlene Fee, NP  predniSONE (DELTASONE) 5 MG tablet Take 5 mg by mouth daily with breakfast.   Yes [provider]  traMADol (ULTRAM) 50 MG tablet Take 1 tablet (50 mg total) by mouth every 8 (eight) hours as needed for moderate pain. 10/07/21  Yes Gerlene Fee, NP  umeclidinium-vilanterol (ANORO ELLIPTA) 62.5-25 MCG/ACT AEPB Inhale 1 puff into the lungs daily. 08/29/21  Yes Tanda Rockers, MD      Allergies    Patient has no known allergies.    Review of Systems   Review of Systems  Constitutional:  Positive for fatigue.  Respiratory:  Positive for shortness of breath (Exertional).   Cardiovascular:  Worsened pain in area of left chest port  Neurological:  Positive for weakness (Generalized).  All other systems reviewed and are negative.  Physical Exam Updated Vital Signs BP 120/72 (BP Location: Right Arm)   Pulse 73   Temp 98.4 F (36.9 C) (Oral)   Resp 20   Ht '6\' 4"'$  (1.93 m)   Wt 53.5 kg   SpO2 100%   BMI 14.36 kg/m  Physical Exam Vitals and nursing note reviewed.  Constitutional:      General: He is not in acute distress.    Appearance: He is well-developed and underweight. He is ill-appearing (Chronically). He is not toxic-appearing or diaphoretic.  HENT:     Head: Normocephalic and atraumatic.     Right Ear: External ear normal.     Left Ear: External ear normal.     Nose: Nose normal.      Mouth/Throat:     Mouth: Mucous membranes are moist.     Pharynx: Oropharynx is clear.  Eyes:     Extraocular Movements: Extraocular movements intact.     Comments: Conjunctival pallor  Cardiovascular:     Rate and Rhythm: Normal rate and regular rhythm.     Heart sounds: No murmur heard. Pulmonary:     Effort: Pulmonary effort is normal. No respiratory distress.     Breath sounds: Normal breath sounds. No wheezing or rales.  Chest:     Chest wall: Tenderness (Localized to area of right port) present.  Abdominal:     General: There is no distension.     Palpations: Abdomen is soft.     Tenderness: There is no abdominal tenderness.  Musculoskeletal:        General: No swelling. Normal range of motion.     Cervical back: Normal range of motion and neck supple. No rigidity.     Right lower leg: No edema.     Left lower leg: No edema.  Skin:    General: Skin is warm and dry.     Coloration: Skin is pale. Skin is not jaundiced.  Neurological:     General: No focal deficit present.     Mental Status: He is alert and oriented to person, place, and time.     Cranial Nerves: No cranial nerve deficit.     Sensory: No sensory deficit.     Motor: No weakness.     Coordination: Coordination normal.  Psychiatric:        Mood and Affect: Mood normal.        Behavior: Behavior normal.        Thought Content: Thought content normal.        Judgment: Judgment normal.     ED Results / Procedures / Treatments   Labs (all labs ordered are listed, but only abnormal results are displayed) Labs Reviewed  LACTIC ACID, PLASMA - Abnormal; Notable for the following components:      Result Value   Lactic Acid, Venous 2.6 (*)    All other components within normal limits  URINALYSIS, ROUTINE W REFLEX MICROSCOPIC - Abnormal; Notable for the following components:   Hgb urine dipstick MODERATE (*)    Protein, ur 100 (*)    All other components within normal limits  CBC WITH DIFFERENTIAL/PLATELET -  Abnormal; Notable for the following components:   WBC 23.0 (*)    RBC 2.36 (*)    Hemoglobin 6.3 (*)    HCT 19.8 (*)    RDW 20.4 (*)  Platelets 45 (*)    nRBC 1.0 (*)    Neutro Abs 11.5 (*)    Monocytes Absolute 3.5 (*)    All other components within normal limits  COMPREHENSIVE METABOLIC PANEL - Abnormal; Notable for the following components:   Glucose, Bld 101 (*)    BUN 44 (*)    Creatinine, Ser 2.36 (*)    Albumin 3.1 (*)    AST 58 (*)    Total Bilirubin 1.7 (*)    GFR, Estimated 26 (*)    All other components within normal limits  CULTURE, BLOOD (ROUTINE X 2)  CULTURE, BLOOD (ROUTINE X 2)  LACTIC ACID, PLASMA  PROTIME-INR  MAGNESIUM  CBC WITH DIFFERENTIAL/PLATELET  TYPE AND SCREEN  PREPARE RBC (CROSSMATCH)  TROPONIN I (HIGH SENSITIVITY)  TROPONIN I (HIGH SENSITIVITY)    EKG EKG Interpretation  Date/Time:  Sunday Oct 12 2021 10:16:51 EDT Ventricular Rate:  75 PR Interval:  143 QRS Duration: 100 QT Interval:  393 QTC Calculation: 439 R Axis:   82 Text Interpretation: Sinus rhythm Borderline right axis deviation Nonspecific T abnrm, anterolateral leads Confirmed by Godfrey Pick [phone]) on 10/12/2021 12:54:58 PM  Radiology DG Chest Port 1 View  Result Date: 10/12/2021 CLINICAL DATA:  Short of breath since last night. Infection at the Port-A-Cath site. History of COPD. History of bone carcinoma. EXAM: PORTABLE CHEST 1 VIEW COMPARISON:  10/02/2021 and older studies. High-resolution chest CT, 10/10/2021. FINDINGS: Cardiac silhouette is normal in size. No mediastinal or hilar masses. Right anterior chest wall, internal jugular, Port-A-Cath is stable. Lungs show diffuse irregular interstitial thickening consistent with fibrosis with underlying emphysema, stable. No lung consolidation. No convincing pulmonary edema. No pleural effusion or pneumothorax. No acute skeletal abnormality. Previous vertebral fractures treated with vertebroplasty. Skeletal structures are diffusely  demineralized. IMPRESSION: 1. No acute cardiopulmonary disease. 2. Chronic lung findings Electronically Signed   By: Lajean Manes M.D.   On: 10/12/2021 11:54    Procedures Procedures    Medications Ordered in ED Medications  lactated ringers bolus 500 mL (0 mLs Intravenous Stopped 10/12/21 1215)  0.9 %  sodium chloride infusion (0 mL/hr Intravenous Stopped 10/12/21 1652)    ED Course/ Medical Decision Making/ A&P                           Medical Decision Making Amount and/or Complexity of Data Reviewed Labs: ordered. Radiology: ordered. ECG/medicine tests: ordered.  Risk Prescription drug management.   This patient presents to the ED for concern of fatigue and generalized weakness, this involves an extensive number of treatment options, and is a complaint that carries with it a high risk of complications and morbidity.  The differential diagnosis includes anemia, infection, dehydration, deconditioning, depression, metabolic derangements   Co morbidities that complicate the patient evaluation  GERD, anemia,, cytopenia, COPD, CKD, MDS, BPH, osteoporosis   Additional history obtained:  Additional history obtained from patient's daughter External records from outside source obtained and reviewed including EMR   Lab Tests:  I Ordered, and personally interpreted labs.  The pertinent results include: Acute on chronic anemia, baseline leukocytosis and thrombocytopenia consistent with MDS, increased creatinine from baseline with elevated BUN suggestive of dehydration; normal electrolytes   Imaging Studies ordered:  I ordered imaging studies including chest x-ray I independently visualized and interpreted imaging which showed no acute findings I agree with the radiologist interpretation   Cardiac Monitoring: / EKG:  The patient was maintained on a cardiac monitor.  I personally viewed and interpreted the cardiac monitored which showed an underlying rhythm of: Sinus  rhythm   Problem List / ED Course / Critical interventions / Medication management  Patient is a 88 year old male presenting from [facility] skilled nursing facility for generalized weakness and fatigue.  He was recently here in the ED 4 days ago prior to his discharge to [facility].  He reports that he felt well in the days following but became very fatigued this morning.  He does have a history of MDS and has required repeated blood transfusions.  He does not have any known blood loss.  On exam, patient appears chronically ill.  Vital signs are notable for widened pulse pressure, consistent with anemia.  His lungs are clear to auscultation.  He is alert and oriented.  He has no focal neurologic deficits.  He has an area of erythema overlying her recent placement of a right-sided upper chest port.  He states that the appearance of this has not changed.  It has been persistently painful and tender since placement.  He reports slightly worsened tenderness to this area recently.  On direct inspection, there is no streaking erythema, or evidence of induration or fluctuance.  Given concern for infection possibly contributing to his symptoms today, cultures were obtained.  Patient was ordered 1 unit PRBCs for treatment of symptomatic anemia.  On CBC, it is confirmed that he has acute on chronic anemia with a hemoglobin of 6.3 today.  He has had poor p.o. intake and he was given bolus of IV fluids on arrival.  CMP confirmed slight increase in creatinine from baseline with elevated BUN consistent with dehydration.  Following IV fluids and during transfusion of PRBCs, patient stated that he felt 100% better.  Lactic acid, initially slightly elevated, normalized on repeat.  Patient to undergo completion of PRBC transfusion.  If, on reassessment, he remains asymptomatic, he can be discharged back to the in center.  Care of patient was signed out to oncoming ED provider. I ordered medication including IVF for  dehydration; PVCs for symptomatic anemia Reevaluation of the patient after these medicines showed that the patient resolved I have reviewed the patients home medicines and have made adjustments as needed   Social Determinants of Health:  Currently residing in a nursing facility  CRITICAL CARE Performed by: Godfrey Pick   Total critical care time: 32 minutes  Critical care time was exclusive of separately billable procedures and treating other patients.  Critical care was necessary to treat or prevent imminent or life-threatening deterioration.  Critical care was time spent personally by me on the following activities: development of treatment plan with patient and/or surrogate as well as nursing, discussions with consultants, evaluation of patient's response to treatment, examination of patient, obtaining history from patient or surrogate, ordering and performing treatments and interventions, ordering and review of laboratory studies, ordering and review of radiographic studies, pulse oximetry and re-evaluation of patient's condition.        Final Clinical Impression(s) / ED Diagnoses Final diagnoses:  Iron deficiency anemia due to chronic blood loss  Symptomatic anemia  Dehydration  Generalized weakness    Rx / DC Orders ED Discharge Orders     None         Godfrey Pick, MD 10/13/21 [phone]

## 2021-10-12 NOTE — Discharge Instructions (Signed)
Follow-up with your family doctor next week

## 2021-10-13 LAB — BPAM RBC
Blood Product Expiration Date: 202306262359
ISSUE DATE / TIME: 202305281327
Unit Type and Rh: 1700

## 2021-10-13 LAB — TYPE AND SCREEN
ABO/RH(D): B NEG
Antibody Screen: NEGATIVE
Unit division: 0

## 2021-10-14 ENCOUNTER — Inpatient Hospital Stay (HOSPITAL_COMMUNITY): Payer: Medicare Other

## 2021-10-14 ENCOUNTER — Inpatient Hospital Stay (HOSPITAL_BASED_OUTPATIENT_CLINIC_OR_DEPARTMENT_OTHER): Payer: Medicare Other | Admitting: Physician Assistant

## 2021-10-14 ENCOUNTER — Encounter (HOSPITAL_COMMUNITY): Payer: Self-pay | Admitting: Hematology

## 2021-10-14 VITALS — BP 109/55 | HR 77 | Temp 98.0°F | Resp 18

## 2021-10-14 DIAGNOSIS — E860 Dehydration: Secondary | ICD-10-CM | POA: Diagnosis present

## 2021-10-14 DIAGNOSIS — R6510 Systemic inflammatory response syndrome (SIRS) of non-infectious origin without acute organ dysfunction: Secondary | ICD-10-CM | POA: Diagnosis not present

## 2021-10-14 DIAGNOSIS — Z9842 Cataract extraction status, left eye: Secondary | ICD-10-CM | POA: Diagnosis not present

## 2021-10-14 DIAGNOSIS — T148XXA Other injury of unspecified body region, initial encounter: Secondary | ICD-10-CM | POA: Diagnosis not present

## 2021-10-14 DIAGNOSIS — Z7952 Long term (current) use of systemic steroids: Secondary | ICD-10-CM | POA: Diagnosis not present

## 2021-10-14 DIAGNOSIS — M7981 Nontraumatic hematoma of soft tissue: Secondary | ICD-10-CM | POA: Insufficient documentation

## 2021-10-14 DIAGNOSIS — Z841 Family history of disorders of kidney and ureter: Secondary | ICD-10-CM | POA: Diagnosis not present

## 2021-10-14 DIAGNOSIS — R531 Weakness: Secondary | ICD-10-CM | POA: Diagnosis not present

## 2021-10-14 DIAGNOSIS — Z87891 Personal history of nicotine dependence: Secondary | ICD-10-CM | POA: Diagnosis not present

## 2021-10-14 DIAGNOSIS — S20211A Contusion of right front wall of thorax, initial encounter: Secondary | ICD-10-CM | POA: Diagnosis not present

## 2021-10-14 DIAGNOSIS — Z7401 Bed confinement status: Secondary | ICD-10-CM | POA: Diagnosis not present

## 2021-10-14 DIAGNOSIS — Z515 Encounter for palliative care: Secondary | ICD-10-CM | POA: Diagnosis not present

## 2021-10-14 DIAGNOSIS — D649 Anemia, unspecified: Secondary | ICD-10-CM | POA: Diagnosis not present

## 2021-10-14 DIAGNOSIS — T80218A Other infection due to central venous catheter, initial encounter: Secondary | ICD-10-CM | POA: Diagnosis present

## 2021-10-14 DIAGNOSIS — Z9841 Cataract extraction status, right eye: Secondary | ICD-10-CM | POA: Diagnosis not present

## 2021-10-14 DIAGNOSIS — R7881 Bacteremia: Secondary | ICD-10-CM | POA: Diagnosis present

## 2021-10-14 DIAGNOSIS — J9611 Chronic respiratory failure with hypoxia: Secondary | ICD-10-CM | POA: Diagnosis present

## 2021-10-14 DIAGNOSIS — R911 Solitary pulmonary nodule: Secondary | ICD-10-CM | POA: Diagnosis not present

## 2021-10-14 DIAGNOSIS — Z66 Do not resuscitate: Secondary | ICD-10-CM | POA: Diagnosis present

## 2021-10-14 DIAGNOSIS — D469 Myelodysplastic syndrome, unspecified: Secondary | ICD-10-CM

## 2021-10-14 DIAGNOSIS — M6281 Muscle weakness (generalized): Secondary | ICD-10-CM | POA: Diagnosis not present

## 2021-10-14 DIAGNOSIS — J449 Chronic obstructive pulmonary disease, unspecified: Secondary | ICD-10-CM | POA: Diagnosis not present

## 2021-10-14 DIAGNOSIS — Z9181 History of falling: Secondary | ICD-10-CM | POA: Diagnosis not present

## 2021-10-14 DIAGNOSIS — Y848 Other medical procedures as the cause of abnormal reaction of the patient, or of later complication, without mention of misadventure at the time of the procedure: Secondary | ICD-10-CM | POA: Diagnosis present

## 2021-10-14 DIAGNOSIS — S7002XS Contusion of left hip, sequela: Secondary | ICD-10-CM | POA: Diagnosis not present

## 2021-10-14 DIAGNOSIS — Z961 Presence of intraocular lens: Secondary | ICD-10-CM | POA: Diagnosis present

## 2021-10-14 DIAGNOSIS — R52 Pain, unspecified: Secondary | ICD-10-CM | POA: Diagnosis not present

## 2021-10-14 DIAGNOSIS — R233 Spontaneous ecchymoses: Secondary | ICD-10-CM | POA: Diagnosis not present

## 2021-10-14 DIAGNOSIS — Y929 Unspecified place or not applicable: Secondary | ICD-10-CM | POA: Diagnosis not present

## 2021-10-14 DIAGNOSIS — K219 Gastro-esophageal reflux disease without esophagitis: Secondary | ICD-10-CM | POA: Diagnosis not present

## 2021-10-14 DIAGNOSIS — S300XXS Contusion of lower back and pelvis, sequela: Secondary | ICD-10-CM | POA: Diagnosis not present

## 2021-10-14 DIAGNOSIS — Z9221 Personal history of antineoplastic chemotherapy: Secondary | ICD-10-CM | POA: Insufficient documentation

## 2021-10-14 DIAGNOSIS — D696 Thrombocytopenia, unspecified: Secondary | ICD-10-CM | POA: Diagnosis not present

## 2021-10-14 DIAGNOSIS — D61818 Other pancytopenia: Secondary | ICD-10-CM | POA: Insufficient documentation

## 2021-10-14 DIAGNOSIS — I959 Hypotension, unspecified: Secondary | ICD-10-CM | POA: Diagnosis not present

## 2021-10-14 DIAGNOSIS — D62 Acute posthemorrhagic anemia: Secondary | ICD-10-CM | POA: Diagnosis not present

## 2021-10-14 DIAGNOSIS — K2100 Gastro-esophageal reflux disease with esophagitis, without bleeding: Secondary | ICD-10-CM | POA: Diagnosis not present

## 2021-10-14 DIAGNOSIS — T80212A Local infection due to central venous catheter, initial encounter: Secondary | ICD-10-CM | POA: Diagnosis not present

## 2021-10-14 DIAGNOSIS — I9589 Other hypotension: Secondary | ICD-10-CM | POA: Diagnosis present

## 2021-10-14 DIAGNOSIS — Z79899 Other long term (current) drug therapy: Secondary | ICD-10-CM | POA: Diagnosis not present

## 2021-10-14 DIAGNOSIS — R262 Difficulty in walking, not elsewhere classified: Secondary | ICD-10-CM | POA: Diagnosis not present

## 2021-10-14 DIAGNOSIS — W19XXXS Unspecified fall, sequela: Secondary | ICD-10-CM | POA: Diagnosis present

## 2021-10-14 DIAGNOSIS — Z741 Need for assistance with personal care: Secondary | ICD-10-CM | POA: Diagnosis not present

## 2021-10-14 DIAGNOSIS — L089 Local infection of the skin and subcutaneous tissue, unspecified: Secondary | ICD-10-CM | POA: Diagnosis not present

## 2021-10-14 DIAGNOSIS — R2681 Unsteadiness on feet: Secondary | ICD-10-CM | POA: Diagnosis not present

## 2021-10-14 DIAGNOSIS — N1832 Chronic kidney disease, stage 3b: Secondary | ICD-10-CM | POA: Diagnosis not present

## 2021-10-14 DIAGNOSIS — I950 Idiopathic hypotension: Secondary | ICD-10-CM | POA: Diagnosis not present

## 2021-10-14 DIAGNOSIS — C419 Malignant neoplasm of bone and articular cartilage, unspecified: Secondary | ICD-10-CM | POA: Diagnosis not present

## 2021-10-14 DIAGNOSIS — J441 Chronic obstructive pulmonary disease with (acute) exacerbation: Secondary | ICD-10-CM | POA: Diagnosis not present

## 2021-10-14 DIAGNOSIS — Z978 Presence of other specified devices: Secondary | ICD-10-CM | POA: Diagnosis not present

## 2021-10-14 DIAGNOSIS — R54 Age-related physical debility: Secondary | ICD-10-CM | POA: Diagnosis present

## 2021-10-14 LAB — CBC
HCT: 27.5 % — ABNORMAL LOW (ref 39.0–52.0)
Hemoglobin: 8.7 g/dL — ABNORMAL LOW (ref 13.0–17.0)
MCH: 26.3 pg (ref 26.0–34.0)
MCHC: 31.6 g/dL (ref 30.0–36.0)
MCV: 83.1 fL (ref 80.0–100.0)
Platelets: 44 10*3/uL — ABNORMAL LOW (ref 150–400)
RBC: 3.31 MIL/uL — ABNORMAL LOW (ref 4.22–5.81)
RDW: 20.5 % — ABNORMAL HIGH (ref 11.5–15.5)
WBC: 25.4 10*3/uL — ABNORMAL HIGH (ref 4.0–10.5)
nRBC: 1.4 % — ABNORMAL HIGH (ref 0.0–0.2)

## 2021-10-14 LAB — SAMPLE TO BLOOD BANK

## 2021-10-14 MED ORDER — SODIUM CHLORIDE 0.9% IV SOLUTION
250.0000 mL | Freq: Once | INTRAVENOUS | Status: AC
  Administered 2021-10-14: 250 mL via INTRAVENOUS

## 2021-10-14 MED ORDER — ACETAMINOPHEN 325 MG PO TABS
650.0000 mg | ORAL_TABLET | Freq: Once | ORAL | Status: AC
  Administered 2021-10-14: 650 mg via ORAL
  Filled 2021-10-14: qty 2

## 2021-10-14 MED ORDER — DIPHENHYDRAMINE HCL 25 MG PO CAPS
25.0000 mg | ORAL_CAPSULE | Freq: Once | ORAL | Status: AC
  Administered 2021-10-14: 25 mg via ORAL
  Filled 2021-10-14: qty 1

## 2021-10-14 MED ORDER — CEPHALEXIN 500 MG PO CAPS: 500.0000 mg | ORAL_CAPSULE | Freq: Three times a day (TID) | ORAL | 0 refills | Status: DC

## 2021-10-14 NOTE — Patient Instructions (Signed)
[place] at [facility] Discharge Instructions  You were seen today by Tarri Abernethy PA-C for your right-chest port swelling and pain.   This is most likely a hematoma, which is a collection of blood underneath the surface of your skin.  There may also be some infection, but this is less likely. You can use ice packs and cold compresses to help decrease the swelling and pain of your hematoma. We will give you platelets today.  We will check your platelets again on Friday, 10/17/2021 to see if you need additional platelets.  (We will keep your platelets >50) You will need to stop at the lab again after your visit today.  They will need to check blood cultures to see if you have any infection in your blood. We will send prescription to the nursing home for Keflex 500 mg 3 times daily for the next 7 days.  This is an antibiotic that we will treat any underlying infection.  ** I will do a spot check of your port when you come back for labs and possible transfusion on Friday, 10/17/2021.  ** You are scheduled for follow-up visit with Dr. Delton Coombes next Monday, 10/20/2021.    Thank you for choosing [place] at [facility] to provide your oncology and hematology care.  To afford each patient quality time with our provider, please arrive at least 15 minutes before your scheduled appointment time.   If you have a lab appointment with the [facility] please come in thru the Main Entrance and check in at the main information desk.  You need to re-schedule your appointment should you arrive 10 or more minutes late.  We strive to give you quality time with our providers, and arriving late affects you and other patients whose appointments are after yours.  Also, if you no show three or more times for appointments you may be dismissed from the clinic at the providers discretion.     Again, thank you for choosing [facility].  Our hope is that  these requests will decrease the amount of time that you wait before being seen by our physicians.       _____________________________________________________________  Should you have questions after your visit to [facility], please contact our office at [phone] and follow the prompts.  Our office hours are 8:00 a.m. and 4:30 p.m. Monday - Friday.  Please note that voicemails left after 4:00 p.m. may not be returned until the following business day.  We are closed weekends and major holidays.  You do have access to a nurse 24-7, just call the main number to the clinic [phone] and do not press any options, hold on the line and a nurse will answer the phone.    For prescription refill requests, have your pharmacy contact our office and allow 72 hours.    Due to Covid, you will need to wear a mask upon entering the hospital. If you do not have a mask, a mask will be given to you at the Main Entrance upon arrival. For doctor visits, patients may have 1 support person age 86 or older with them. For treatment visits, patients can not have anyone with them due to social distancing guidelines and our immunocompromised population.

## 2021-10-14 NOTE — Patient Instructions (Signed)
[facility]  Discharge Instructions: Thank you for choosing [facility] to provide your oncology and hematology care.  If you have a lab appointment with the [facility], please come in thru the Main Entrance and check in at the main information desk.  Wear comfortable clothing and clothing appropriate for easy access to any Portacath or PICC line.   We strive to give you quality time with your provider. You may need to reschedule your appointment if you arrive late (15 or more minutes).  Arriving late affects you and other patients whose appointments are after yours.  Also, if you miss three or more appointments without notifying the office, you may be dismissed from the clinic at the provider's discretion.      For prescription refill requests, have your pharmacy contact our office and allow 72 hours for refills to be completed.    Today you received a unit of platelets.     To help prevent nausea and vomiting after your treatment, we encourage you to take your nausea medication as directed.  BELOW ARE SYMPTOMS THAT SHOULD BE REPORTED IMMEDIATELY: *FEVER GREATER THAN 100.4 F (38 C) OR HIGHER *CHILLS OR SWEATING *NAUSEA AND VOMITING THAT IS NOT CONTROLLED WITH YOUR NAUSEA MEDICATION *UNUSUAL SHORTNESS OF BREATH *UNUSUAL BRUISING OR BLEEDING *URINARY PROBLEMS (pain or burning when urinating, or frequent urination) *BOWEL PROBLEMS (unusual diarrhea, constipation, pain near the anus) TENDERNESS IN MOUTH AND THROAT WITH OR WITHOUT PRESENCE OF ULCERS (sore throat, sores in mouth, or a toothache) UNUSUAL RASH, SWELLING OR PAIN  UNUSUAL VAGINAL DISCHARGE OR ITCHING   Items with * indicate a potential emergency and should be followed up as soon as possible or go to the Emergency Department if any problems should occur.  Please show the CHEMOTHERAPY ALERT CARD or IMMUNOTHERAPY ALERT CARD at check-in to the Emergency Department and triage nurse.  Should you have  questions after your visit or need to cancel or reschedule your appointment, please contact [facility] [phone]  and follow the prompts.  Office hours are 8:00 a.m. to 4:30 p.m. Monday - Friday. Please note that voicemails left after 4:00 p.m. may not be returned until the following business day.  We are closed weekends and major holidays. You have access to a nurse at all times for urgent questions. Please call the main number to the clinic [phone] and follow the prompts.  For any non-urgent questions, you may also contact your provider using MyChart. We now offer e-Visits for anyone 71 and older to request care online for non-urgent symptoms. For details visit [email or web address].   Also download the MyChart app! Go to the app store, search "MyChart", open the app, select [facility], and log in with your MyChart username and password.  Due to Covid, a mask is required upon entering the hospital/clinic. If you do not have a mask, one will be given to you upon arrival. For doctor visits, patients may have 1 support person aged 26 or older with them. For treatment visits, patients cannot have anyone with them due to current Covid guidelines and our immunocompromised population.

## 2021-10-14 NOTE — Progress Notes (Signed)
Patient tolerated transfusion with no complaints voiced.  Side effects with management reviewed with understanding verbalized.  Peripheral IV site clean and dry with no bruising or swelling noted at site.  Good blood return noted before and after administration.  Gauze and coban used.   Patient left in satisfactory condition with VSS and no s/s of distress noted.

## 2021-10-14 NOTE — Progress Notes (Signed)
[name] [address] [place] [address] Phone: [phone] Fax: [facility] PROGRESS NOTE   DELAN KSIAZEK [id number] [birth year] 88 y.o.  [name] is managed by Dr. 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 Delton Coombes for high risk MDS and pancytopenia.  He has right-sided chest port that was inserted by Dr. Blake Divine on 08/07/2021.  Prior port was removed by Dr. Constance Haw on 08/07/2021 due to hematoma and possible infection - cultures were negative, but there was concern at the time for possible surrounding cellulitis, treated with Augmentin.  Patient reports that since Friday 10/10/2021 he has had progressive pain and swelling of his right-sided Port-A-Cath.  He reports burning pain across his port site, which is extremely tender to even light palpation.  He denies any purulent drainage or bloody drainage.  No fever, chills, or night sweats.  No preceding injury to port site.  Review of Systems:  Review of Systems  Constitutional:  Positive for fatigue. Negative for activity change, appetite change, chills, diaphoresis, fever and unexpected weight change.  HENT:  Negative for mouth sores, nosebleeds, sore throat and trouble swallowing.   Respiratory:  Positive for shortness of breath (COPD). Negative for cough.   Cardiovascular:  Negative for chest pain, palpitations and leg swelling.  Gastrointestinal:  Negative for abdominal pain, blood in stool, constipation, diarrhea, nausea and vomiting.  Genitourinary:  Negative for dysuria and hematuria.  Neurological:  Negative for  dizziness, light-headedness, numbness and headaches.  Psychiatric/Behavioral:  Positive for sleep disturbance. Negative for dysphoric mood. The patient is not nervous/anxious.     Past Medical History, Surgical history, Social history, and Family history were reviewed as documented elsewhere in chart, and were updated as appropriate.   Assessment & Plan:    1.  Hematoma of right-chest Port-A-Cath, with possible cellulitis - Right-sided chest port that was inserted by Dr. Blake Divine on 08/07/2021.  Prior port was removed by Dr. Constance Haw on 08/07/2021 due to hematoma and possible infection - cultures were negative, but there was concern at the time for possible surrounding cellulitis, treated with Augmentin. - Since Friday 10/10/2021, progressive pain and swelling of his right-sided Port-A-Cath with a burning pain, no drainage.  No fevers or chills. - Excised area of swelling overlying right chest port, warm, purplish discoloration, markedly tender to palpation, no drainage - Discussed with Dr. Delton Coombes (supervising oncologist) Dr. Constance Haw (general surgeon): Hematoma suspected, supportive care plus precautionary antibiotics due to immunosuppression recommended in hopes that the port will not need to be removed - PLAN: Check peripheral blood cultures x2. - Treat for possible cellulitis with Keflex 500 mg 3 times daily x7 days - Platelet transfusion today.  Repeat CBC and possible platelet transfusion twice weekly (keep platelets >50) - Recommended supportive care with cold compress - Patient is scheduled to see Dr. Delton Coombes in 1 week on 10/20/2021 for regular follow-up, will have port reassessed at that time.    Objective:   Physical Exam:  Blood pressure: 101/57 Heart rate: 83 bpm Respirations: 18 respirations per minute Temperature: 9198.1 F (oral) Oxygen saturation: 94%, on 3 L supplemental oxygen (baseline) ECOG: 3   Physical Exam Vitals reviewed.  Constitutional:  Appearance: Normal appearance. He is cachectic.  Cardiovascular:     Rate and Rhythm: Normal rate and regular rhythm.     Pulses: Normal pulses.     Heart sounds: Normal heart sounds.  Pulmonary:     Effort: Pulmonary effort is normal.     Breath sounds: Normal breath sounds.  Musculoskeletal:     Right lower leg: No edema.     Left lower leg: No edema.  Skin:    Findings: Bruising and lesion (Egg-sized area of swelling overlying the right-chest port.  Area is warm, dark red/purple in color, and markedly tender to palpation.  There is some surrounding erythema.  No drainage.  Picture attached.) present.  Neurological:     General: No focal deficit present.     Mental Status: He is alert and oriented to person, place, and time.  Psychiatric:        Mood and Affect: Mood normal.        Behavior: Behavior normal.    Lab Review:     Component Value Date/Time   NA 137 10/12/2021 1049   K 4.2 10/12/2021 1049   CL 106 10/12/2021 1049   CO2 25 10/12/2021 1049   GLUCOSE 101 (H) 10/12/2021 1049   BUN 44 (H) 10/12/2021 1049   CREATININE 2.36 (H) 10/12/2021 1049   CALCIUM 8.9 10/12/2021 1049   CALCIUM 8.9 03/20/2016 1052   PROT 7.2 10/12/2021 1049   ALBUMIN 3.1 (L) 10/12/2021 1049   AST 58 (H) 10/12/2021 1049   ALT 33 10/12/2021 1049   ALKPHOS 86 10/12/2021 1049   BILITOT 1.7 (H) 10/12/2021 1049   GFRNONAA 26 (L) 10/12/2021 1049   GFRAA 52 (L) 06/21/2019 0207       Component Value Date/Time   WBC 25.4 (H) 10/14/2021 1040   RBC 3.31 (L) 10/14/2021 1040   HGB 8.7 (L) 10/14/2021 1040   HCT 27.5 (L) 10/14/2021 1040   PLT 44 (L) 10/14/2021 1040   MCV 83.1 10/14/2021 1040   MCH 26.3 10/14/2021 1040   MCHC 31.6 10/14/2021 1040   RDW 20.5 (H) 10/14/2021 1040   LYMPHSABS 3.9 10/12/2021 1112   MONOABS 3.5 (H) 10/12/2021 1112   EOSABS 0.0 10/12/2021 1112   BASOSABS 0.0 10/12/2021 1112   -------------------------------  Imaging from last 24 hours (if applicable):    Most recent  imaging reviewed by me and not relevant to today's visit.   Wrap-Up:    All questions were answered. The patient knows to call the clinic with any problems, questions or concerns.  Medical decision making: Moderate  Time spent on visit: I spent 20 minutes counseling the patient face to face. The total time spent in the appointment was 30 minutes and more than 50% was on counseling.   Harriett Rush, PA-C  10/14/21 1:22 PM

## 2021-10-15 LAB — BPAM PLATELET PHERESIS
Blood Product Expiration Date: 202306012359
ISSUE DATE / TIME: 202305301303
Unit Type and Rh: 7300

## 2021-10-15 LAB — PREPARE PLATELET PHERESIS: Unit division: 0

## 2021-10-16 ENCOUNTER — Other Ambulatory Visit (HOSPITAL_COMMUNITY)
Admission: RE | Admit: 2021-10-16 | Discharge: 2021-10-16 | Disposition: A | Discharge status: Home / Self Care | Payer: Medicare Other | Source: Skilled Nursing Facility | Attending: Adult Health | Admitting: Adult Health

## 2021-10-16 ENCOUNTER — Other Ambulatory Visit: Payer: Self-pay | Admitting: Adult Health

## 2021-10-16 DIAGNOSIS — D469 Myelodysplastic syndrome, unspecified: Secondary | ICD-10-CM | POA: Insufficient documentation

## 2021-10-16 LAB — CBC WITH DIFFERENTIAL/PLATELET
Abs Immature Granulocytes: 3.82 10*3/uL — ABNORMAL HIGH (ref 0.00–0.07)
Basophils Absolute: 0.1 10*3/uL (ref 0.0–0.1)
Basophils Relative: 1 %
Eosinophils Absolute: 0 10*3/uL (ref 0.0–0.5)
Eosinophils Relative: 0 %
HCT: 24.1 % — ABNORMAL LOW (ref 39.0–52.0)
Hemoglobin: 7.6 g/dL — ABNORMAL LOW (ref 13.0–17.0)
Immature Granulocytes: 16 %
Lymphocytes Relative: 6 %
Lymphs Abs: 1.4 10*3/uL (ref 0.7–4.0)
MCH: 26.3 pg (ref 26.0–34.0)
MCHC: 31.5 g/dL (ref 30.0–36.0)
MCV: 83.4 fL (ref 80.0–100.0)
Monocytes Absolute: 7.8 10*3/uL — ABNORMAL HIGH (ref 0.1–1.0)
Monocytes Relative: 33 %
Neutro Abs: 10.5 10*3/uL — ABNORMAL HIGH (ref 1.7–7.7)
Neutrophils Relative %: 44 %
Platelets: 38 10*3/uL — ABNORMAL LOW (ref 150–400)
RBC: 2.89 MIL/uL — ABNORMAL LOW (ref 4.22–5.81)
RDW: 20.3 % — ABNORMAL HIGH (ref 11.5–15.5)
WBC: 23.7 10*3/uL — ABNORMAL HIGH (ref 4.0–10.5)
nRBC: 1.6 % — ABNORMAL HIGH (ref 0.0–0.2)

## 2021-10-16 LAB — BLOOD CULTURE ID PANEL (REFLEXED) - BCID2

## 2021-10-16 MED ORDER — OXYCODONE HCL 5 MG PO TABS: 5.0000 mg | ORAL_TABLET | Freq: Four times a day (QID) | ORAL | 0 refills | Status: DC | PRN

## 2021-10-17 ENCOUNTER — Other Ambulatory Visit: Payer: Self-pay

## 2021-10-17 ENCOUNTER — Inpatient Hospital Stay (HOSPITAL_COMMUNITY): Payer: Medicare Other | Attending: Hematology

## 2021-10-17 ENCOUNTER — Inpatient Hospital Stay (HOSPITAL_COMMUNITY)
Admission: AD | Admit: 2021-10-17 | Discharge: 2021-10-24 | DRG: 315 | Disposition: A | Discharge status: Hospice | Payer: Medicare Other | Source: Ambulatory Visit | Attending: Family Medicine | Admitting: Family Medicine

## 2021-10-17 ENCOUNTER — Inpatient Hospital Stay (HOSPITAL_COMMUNITY): Payer: Medicare Other

## 2021-10-17 ENCOUNTER — Inpatient Hospital Stay (HOSPITAL_BASED_OUTPATIENT_CLINIC_OR_DEPARTMENT_OTHER): Payer: Medicare Other | Admitting: Physician Assistant

## 2021-10-17 ENCOUNTER — Encounter (HOSPITAL_COMMUNITY): Payer: Self-pay | Admitting: Physician Assistant

## 2021-10-17 DIAGNOSIS — J449 Chronic obstructive pulmonary disease, unspecified: Secondary | ICD-10-CM | POA: Diagnosis present

## 2021-10-17 DIAGNOSIS — Z79899 Other long term (current) drug therapy: Secondary | ICD-10-CM | POA: Diagnosis not present

## 2021-10-17 DIAGNOSIS — R7881 Bacteremia: Secondary | ICD-10-CM | POA: Diagnosis present

## 2021-10-17 DIAGNOSIS — Z515 Encounter for palliative care: Secondary | ICD-10-CM

## 2021-10-17 DIAGNOSIS — R54 Age-related physical debility: Secondary | ICD-10-CM | POA: Diagnosis present

## 2021-10-17 DIAGNOSIS — Z66 Do not resuscitate: Secondary | ICD-10-CM | POA: Diagnosis present

## 2021-10-17 DIAGNOSIS — S300XXS Contusion of lower back and pelvis, sequela: Secondary | ICD-10-CM | POA: Diagnosis not present

## 2021-10-17 DIAGNOSIS — S7002XS Contusion of left hip, sequela: Secondary | ICD-10-CM

## 2021-10-17 DIAGNOSIS — Z9842 Cataract extraction status, left eye: Secondary | ICD-10-CM | POA: Diagnosis not present

## 2021-10-17 DIAGNOSIS — Z87891 Personal history of nicotine dependence: Secondary | ICD-10-CM | POA: Diagnosis not present

## 2021-10-17 DIAGNOSIS — R6510 Systemic inflammatory response syndrome (SIRS) of non-infectious origin without acute organ dysfunction: Principal | ICD-10-CM | POA: Diagnosis present

## 2021-10-17 DIAGNOSIS — D62 Acute posthemorrhagic anemia: Secondary | ICD-10-CM | POA: Diagnosis present

## 2021-10-17 DIAGNOSIS — D61818 Other pancytopenia: Secondary | ICD-10-CM | POA: Diagnosis present

## 2021-10-17 DIAGNOSIS — Z9841 Cataract extraction status, right eye: Secondary | ICD-10-CM

## 2021-10-17 DIAGNOSIS — K2100 Gastro-esophageal reflux disease with esophagitis, without bleeding: Secondary | ICD-10-CM | POA: Diagnosis present

## 2021-10-17 DIAGNOSIS — L089 Local infection of the skin and subcutaneous tissue, unspecified: Secondary | ICD-10-CM

## 2021-10-17 DIAGNOSIS — Z7401 Bed confinement status: Secondary | ICD-10-CM | POA: Diagnosis not present

## 2021-10-17 DIAGNOSIS — Z7952 Long term (current) use of systemic steroids: Secondary | ICD-10-CM | POA: Diagnosis not present

## 2021-10-17 DIAGNOSIS — Z961 Presence of intraocular lens: Secondary | ICD-10-CM | POA: Diagnosis present

## 2021-10-17 DIAGNOSIS — R52 Pain, unspecified: Secondary | ICD-10-CM | POA: Diagnosis not present

## 2021-10-17 DIAGNOSIS — E860 Dehydration: Secondary | ICD-10-CM | POA: Diagnosis present

## 2021-10-17 DIAGNOSIS — K219 Gastro-esophageal reflux disease without esophagitis: Secondary | ICD-10-CM | POA: Diagnosis not present

## 2021-10-17 DIAGNOSIS — D649 Anemia, unspecified: Secondary | ICD-10-CM

## 2021-10-17 DIAGNOSIS — W19XXXS Unspecified fall, sequela: Secondary | ICD-10-CM | POA: Diagnosis present

## 2021-10-17 DIAGNOSIS — N183 Chronic kidney disease, stage 3 unspecified: Secondary | ICD-10-CM | POA: Diagnosis present

## 2021-10-17 DIAGNOSIS — J9611 Chronic respiratory failure with hypoxia: Secondary | ICD-10-CM | POA: Diagnosis present

## 2021-10-17 DIAGNOSIS — Z841 Family history of disorders of kidney and ureter: Secondary | ICD-10-CM

## 2021-10-17 DIAGNOSIS — R531 Weakness: Secondary | ICD-10-CM | POA: Diagnosis not present

## 2021-10-17 DIAGNOSIS — N1832 Chronic kidney disease, stage 3b: Secondary | ICD-10-CM | POA: Diagnosis present

## 2021-10-17 DIAGNOSIS — D469 Myelodysplastic syndrome, unspecified: Secondary | ICD-10-CM | POA: Diagnosis not present

## 2021-10-17 DIAGNOSIS — T148XXA Other injury of unspecified body region, initial encounter: Secondary | ICD-10-CM | POA: Diagnosis present

## 2021-10-17 DIAGNOSIS — T80218A Other infection due to central venous catheter, initial encounter: Secondary | ICD-10-CM | POA: Diagnosis present

## 2021-10-17 DIAGNOSIS — Y848 Other medical procedures as the cause of abnormal reaction of the patient, or of later complication, without mention of misadventure at the time of the procedure: Secondary | ICD-10-CM | POA: Diagnosis present

## 2021-10-17 DIAGNOSIS — D696 Thrombocytopenia, unspecified: Secondary | ICD-10-CM | POA: Diagnosis not present

## 2021-10-17 DIAGNOSIS — I959 Hypotension, unspecified: Secondary | ICD-10-CM | POA: Diagnosis not present

## 2021-10-17 DIAGNOSIS — Y929 Unspecified place or not applicable: Secondary | ICD-10-CM | POA: Diagnosis not present

## 2021-10-17 DIAGNOSIS — I9589 Other hypotension: Secondary | ICD-10-CM | POA: Diagnosis present

## 2021-10-17 DIAGNOSIS — K30 Functional dyspepsia: Secondary | ICD-10-CM | POA: Diagnosis present

## 2021-10-17 LAB — URINALYSIS, COMPLETE (UACMP) WITH MICROSCOPIC
Bilirubin Urine: NEGATIVE
Glucose, UA: NEGATIVE mg/dL
Ketones, ur: NEGATIVE mg/dL
Leukocytes,Ua: NEGATIVE
Nitrite: NEGATIVE
Protein, ur: 100 mg/dL — AB
Specific Gravity, Urine: 1.015 (ref 1.005–1.030)
pH: 5 (ref 5.0–8.0)

## 2021-10-17 LAB — BASIC METABOLIC PANEL
Anion gap: 5 (ref 5–15)
BUN: 42 mg/dL — ABNORMAL HIGH (ref 8–23)
CO2: 22 mmol/L (ref 22–32)
Calcium: 9.1 mg/dL (ref 8.9–10.3)
Chloride: 108 mmol/L (ref 98–111)
Creatinine, Ser: 2.2 mg/dL — ABNORMAL HIGH (ref 0.61–1.24)
GFR, Estimated: 28 mL/min — ABNORMAL LOW (ref >60)
Glucose, Bld: 102 mg/dL — ABNORMAL HIGH (ref 70–99)
Potassium: 4.3 mmol/L (ref 3.5–5.1)
Sodium: 135 mmol/L (ref 135–145)

## 2021-10-17 LAB — TYPE AND SCREEN
ABO/RH(D): B NEG
Antibody Screen: NEGATIVE

## 2021-10-17 LAB — CBC
HCT: 25.1 % — ABNORMAL LOW (ref 39.0–52.0)
Hemoglobin: 7.8 g/dL — ABNORMAL LOW (ref 13.0–17.0)
MCH: 26.3 pg (ref 26.0–34.0)
MCHC: 31.1 g/dL (ref 30.0–36.0)
MCV: 84.5 fL (ref 80.0–100.0)
Platelets: 44 10*3/uL — ABNORMAL LOW (ref 150–400)
RBC: 2.97 MIL/uL — ABNORMAL LOW (ref 4.22–5.81)
RDW: 21 % — ABNORMAL HIGH (ref 11.5–15.5)
WBC: 38.5 10*3/uL — ABNORMAL HIGH (ref 4.0–10.5)
nRBC: 1 % — ABNORMAL HIGH (ref 0.0–0.2)

## 2021-10-17 LAB — MRSA NEXT GEN BY PCR, NASAL: MRSA by PCR Next Gen: NOT DETECTED

## 2021-10-17 LAB — SAMPLE TO BLOOD BANK

## 2021-10-17 LAB — LACTIC ACID, PLASMA
Lactic Acid, Venous: 1 mmol/L (ref 0.5–1.9)
Lactic Acid, Venous: 1.1 mmol/L (ref 0.5–1.9)

## 2021-10-17 MED ORDER — MORPHINE SULFATE (PF) 2 MG/ML IV SOLN
1.0000 mg | Freq: Once | INTRAVENOUS | Status: AC
  Administered 2021-10-17: 1 mg via INTRAVENOUS
  Filled 2021-10-17: qty 1

## 2021-10-17 MED ORDER — PANTOPRAZOLE SODIUM 40 MG IV SOLR
40.0000 mg | Freq: Two times a day (BID) | INTRAVENOUS | Status: DC
  Administered 2021-10-17 – 2021-10-24 (×15): 40 mg via INTRAVENOUS
  Filled 2021-10-17 (×15): qty 10

## 2021-10-17 MED ORDER — SODIUM CHLORIDE 0.9% IV SOLUTION: 250.0000 mL | Freq: Once | INTRAVENOUS | Status: DC

## 2021-10-17 MED ORDER — CHLORHEXIDINE GLUCONATE CLOTH 2 % EX PADS
6.0000 | MEDICATED_PAD | Freq: Every day | CUTANEOUS | Status: DC
  Administered 2021-10-18 – 2021-10-23 (×5): 6 via TOPICAL

## 2021-10-17 MED ORDER — VANCOMYCIN HCL 1250 MG/250ML IV SOLN
1250.0000 mg | Freq: Once | INTRAVENOUS | Status: AC
  Administered 2021-10-17: 1250 mg via INTRAVENOUS
  Filled 2021-10-17: qty 250

## 2021-10-17 MED ORDER — SODIUM CHLORIDE 0.9 % IV SOLN
2.0000 g | INTRAVENOUS | Status: DC
  Administered 2021-10-17 – 2021-10-18 (×2): 2 g via INTRAVENOUS
  Filled 2021-10-17 (×2): qty 12.5

## 2021-10-17 MED ORDER — VANCOMYCIN HCL 750 MG/150ML IV SOLN
750.0000 mg | INTRAVENOUS | Status: AC
  Administered 2021-10-19 – 2021-10-23 (×3): 750 mg via INTRAVENOUS
  Filled 2021-10-17 (×3): qty 150

## 2021-10-17 MED ORDER — MORPHINE SULFATE (PF) 2 MG/ML IV SOLN
1.0000 mg | INTRAVENOUS | Status: DC | PRN
  Administered 2021-10-17 (×2): 1 mg via INTRAVENOUS
  Filled 2021-10-17 (×3): qty 1

## 2021-10-17 MED ORDER — ACETAMINOPHEN 325 MG PO TABS
650.0000 mg | ORAL_TABLET | Freq: Four times a day (QID) | ORAL | Status: DC | PRN
  Administered 2021-10-18: 650 mg via ORAL
  Filled 2021-10-17: qty 2

## 2021-10-17 MED ORDER — ONDANSETRON HCL 4 MG PO TABS: 4.0000 mg | ORAL_TABLET | Freq: Four times a day (QID) | ORAL | Status: DC | PRN

## 2021-10-17 MED ORDER — UMECLIDINIUM-VILANTEROL 62.5-25 MCG/ACT IN AEPB
1.0000 | INHALATION_SPRAY | Freq: Every day | RESPIRATORY_TRACT | Status: DC
  Administered 2021-10-18 – 2021-10-24 (×7): 1 via RESPIRATORY_TRACT
  Filled 2021-10-17 (×2): qty 14

## 2021-10-17 MED ORDER — OXYCODONE HCL 5 MG PO TABS
5.0000 mg | ORAL_TABLET | Freq: Four times a day (QID) | ORAL | Status: DC | PRN
  Administered 2021-10-17: 5 mg via ORAL
  Filled 2021-10-17: qty 1

## 2021-10-17 MED ORDER — MIDODRINE HCL 5 MG PO TABS
5.0000 mg | ORAL_TABLET | Freq: Three times a day (TID) | ORAL | Status: DC
  Administered 2021-10-17 – 2021-10-24 (×21): 5 mg via ORAL
  Filled 2021-10-17 (×21): qty 1

## 2021-10-17 MED ORDER — PREDNISONE 10 MG PO TABS
5.0000 mg | ORAL_TABLET | Freq: Every day | ORAL | Status: DC
  Administered 2021-10-18 – 2021-10-24 (×7): 5 mg via ORAL
  Filled 2021-10-17 (×7): qty 1

## 2021-10-17 MED ORDER — ALBUTEROL SULFATE (2.5 MG/3ML) 0.083% IN NEBU
3.0000 mL | INHALATION_SOLUTION | RESPIRATORY_TRACT | Status: DC | PRN
Start: 2021-10-17 — End: 2021-10-24

## 2021-10-17 MED ORDER — ACETAMINOPHEN 325 MG PO TABS: 650.0000 mg | ORAL_TABLET | Freq: Once | ORAL | Status: DC

## 2021-10-17 MED ORDER — ACETAMINOPHEN 650 MG RE SUPP: 650.0000 mg | Freq: Four times a day (QID) | RECTAL | Status: DC | PRN

## 2021-10-17 MED ORDER — LIDOCAINE HCL (PF) 1 % IJ SOLN
30.0000 mL | Freq: Once | INTRAMUSCULAR | Status: AC
  Administered 2021-10-17: 30 mL via INTRADERMAL
  Filled 2021-10-17: qty 30

## 2021-10-17 MED ORDER — DIPHENHYDRAMINE HCL 25 MG PO CAPS: 25.0000 mg | ORAL_CAPSULE | Freq: Once | ORAL | Status: DC

## 2021-10-17 MED ORDER — ONDANSETRON HCL 4 MG/2ML IJ SOLN: 4.0000 mg | Freq: Four times a day (QID) | INTRAMUSCULAR | Status: DC | PRN

## 2021-10-17 MED ORDER — SODIUM CHLORIDE 0.9 % IV SOLN: INTRAVENOUS | Status: AC

## 2021-10-17 NOTE — Progress Notes (Signed)
Pharmacy Antibiotic Note  [name] is a 88 y.o. male admitted on 10/17/2021 with Port-A-Cath hematoma and concern for infection. PAC excised today and catheter tip culture sent. Pharmacy has been consulted for Cefepime and Vancomycin dosing.   Noted 7-day course of Cephalexin begun on 10/14/21 for possible cellulitis at [facility] site.   BCID 10/14/21 showed MRSE in single set of blood cultures.  10/12/21 blood cultures with no growth x 4 days to date.   Creatinine up from baseline.  Plan: Cefepime 2gm IV q24h. Vancomycin 1250 mg IV loading dose Vancomycin 750 mg IV Q 48 hrs. Goal AUC 400-550. Expected AUC: 535 SCr used: 2.20  Follow renal function, culture data, clinical progress, and antibiotic plans.  Height: '6\' 4"'$  (193 cm) Weight: 53.1 kg (117 lb 1 oz) IBW/kg (Calculated) : 86.8  Temp (24hrs), Avg:97.9 F (36.6 C), Min:97.4 F (36.3 C), Max:98.3 F (36.8 C)  Recent Labs  Lab 10/12/21 0700 10/12/21 1049 10/12/21 1112 10/12/21 1119 10/12/21 1236 10/14/21 1040 10/16/21 0530 10/17/21 0922 10/17/21 1433 10/17/21 1603  WBC 20.6*  --  23.0*  --   --  25.4* 23.7* 38.5*  --   --   CREATININE  --  2.36*  --   --   --   --   --   --  2.20*  --   LATICACIDVEN  --   --   --  2.6* 1.7  --   --   --  1.0 1.1    Estimated Creatinine Clearance: 16.8 mL/min (A) (by C-G formula based on SCr of 2.2 mg/dL (H)).    No Known Allergies  Antimicrobials this admission:  Cefepime 6/2 >> Vancomycin 6/2 >>  Dose adjustments this admission:  n/a  Microbiology results:  6/2 blood: pending  6/2 catheter tip: pending  6/2 MRSA PCR: negative   10/14/21 BCID: MRSE in single set of blood cultures.  10/12/21 blood cultures with no growth x 4 days to date.  Thank you for allowing pharmacy to be a part of this patient's care.  Arty Baumgartner, [place] 10/17/2021 6:27 PM

## 2021-10-17 NOTE — Progress Notes (Signed)
[name]  CXR with removal of port and no issues related to port removal.  [name], MD [facility] [place], [place] [address] [phone] (office)

## 2021-10-17 NOTE — Progress Notes (Addendum)
[place] [facility] [address], [place] [address] Phone: [phone] Fax: [facility] PROGRESS NOTE   Thomas PETERSHEIM [id number] Dec 08, 1929 88 y.o.  [name] is managed by Dr. 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 Delton Coombes for high risk MDS and pancytopenia.  He was seen by me for symptom management visit on 10/14/2021 for swelling, redness, and pain of his right sided Port-A-Cath.  This was felt to most likely be from hematoma, but due to his immunosuppressive status he was also started on Keflex 500 mg 3 times daily and blood cultures were obtained.  He returns today for recheck.  Patient reports that he is "absolutely miserable" today.  He is having severe pain of his left hip, at the site of previous fall and left hip hematoma.  He also reports that he had "black vomit" x3 last night.  He states that he "cannot eat or drink anything."  He complains of severe fatigue and severe pain.  States that he has "never felt this bad in his life."    Blood cultures from 10/14/2021 show staph epidermidis, questionable contaminant versus bacteremia. Increased WBC today 38.5, was 23.7 on 10/14/2021. Vital signs stable with blood pressure 91/53, heart rate 94.  Respiratory rate 24, appears to be in pain.  Review of Systems:  Review of Systems  Constitutional:  Positive for activity change, fatigue and unexpected weight change. Negative for appetite change, chills, diaphoresis and fever.  HENT:  Negative for mouth sores, nosebleeds, sore throat and trouble swallowing.    Respiratory:  Positive for shortness of breath (COPD). Negative for cough.   Cardiovascular:  Negative for chest pain, palpitations and leg swelling.  Gastrointestinal:  Positive for vomiting (black-colored emesis). Negative for abdominal pain, blood in stool, constipation, diarrhea and nausea.  Genitourinary:  Negative for dysuria and hematuria.  Musculoskeletal:  Positive for arthralgias.  Skin:  Positive for pallor.  Neurological:  Positive for weakness. Negative for dizziness, light-headedness, numbness and headaches.  Hematological:  Bruises/bleeds easily.  Psychiatric/Behavioral:  Positive for sleep disturbance. Negative for dysphoric mood. The patient is not nervous/anxious.     Past Medical History, Surgical history, Social history, and Family history were reviewed as documented elsewhere in chart, and were updated as appropriate.   Assessment & Plan:    ASSESSMENT: 1.  Blood cultures positive for staph epidermidis, question contaminant versus infection 2.  Right-sided Port-A-Cath hematoma 3.  Possible coffee-ground emesis 4.  Dehydration secondary to poor oral intake 5.  Severe pain secondary to left hip hematoma 6.  MDS with anemia and thrombocytopenia  PLAN: - 1 mg of IV morphine given in clinic due to severe pain with improvement in symptoms - Platelet transfusion x1 (goal platelets >50) - Recommend admission to hospital for management of the above.  Will defer to hospitalist expertise, but patient may benefit from IV antibiotics and repeat blood culture, as well as pain control and possible removal of port.  Direct admission to hospital discussed with Dr. Carles Collet, who accepts.  **GOALS OF CARE CONVERSATION: [name] and I had extensive conversation regarding his condition and his wishes for end-of-life care.  He does not want any "heroic  measures" such as chest compressions, intubation, or feeding tube.  He states that when the time comes he would like to die at home.  He has  been having conversations with his daughter about when he should transition to hospice.  He reports that he "knows he is not going to get any better and is ready to take what comes."  We discussed that he has overall been declining, and that even if he recovers from this hospital stay, he will likely never regain health or functionality to live independently at home again and would likely require readmission to the hospital again in the near future.  He verbalizes that he would rather be at home with family than "constantly in and out of the hospital."  We discussed that he would be appropriate for hospice at this time if he chose to pursue that.  However, if patient does not wish for hospice care at this time, we will happily continue to provide supportive care and transfusions at [facility].  Would recommend palliative care consult and goals of care discussion while hospitalized as well.   Objective:   Physical Exam:  ECOG: 3 Physical Exam Vitals reviewed.  Constitutional:      Appearance: Normal appearance. He is cachectic.     Comments: Weak-appearing, frail, in moderate distress secondary to pain  Cardiovascular:     Rate and Rhythm: Normal rate and regular rhythm.     Pulses: Normal pulses.     Heart sounds: Normal heart sounds.  Pulmonary:     Effort: Pulmonary effort is normal.     Breath sounds: Normal breath sounds.  Musculoskeletal:     Right lower leg: No edema.     Left lower leg: No edema.  Skin:    Findings: Bruising and lesion (Egg-sized area of swelling overlying the right-chest port.  Area is warm, dark red/purple in color, and markedly tender to palpation.  There is some surrounding erythema.  No drainage.) present.  Neurological:     General: No focal deficit present.     Mental Status: He is alert and oriented to person, place, and time.  Psychiatric:        Mood and Affect: Mood normal.        Behavior: Behavior normal.    Lab Review:     Component Value  Date/Time   NA 135 10/17/2021 1433   K 4.3 10/17/2021 1433   CL 108 10/17/2021 1433   CO2 22 10/17/2021 1433   GLUCOSE 102 (H) 10/17/2021 1433   BUN 42 (H) 10/17/2021 1433   CREATININE 2.20 (H) 10/17/2021 1433   CALCIUM 9.1 10/17/2021 1433   CALCIUM 8.9 03/20/2016 1052   PROT 7.2 10/12/2021 1049   ALBUMIN 3.1 (L) 10/12/2021 1049   AST 58 (H) 10/12/2021 1049   ALT 33 10/12/2021 1049   ALKPHOS 86 10/12/2021 1049   BILITOT 1.7 (H) 10/12/2021 1049   GFRNONAA 28 (L) 10/17/2021 1433   GFRAA 52 (L) 06/21/2019 0207       Component Value Date/Time   WBC 38.5 (H) 10/17/2021 0922   RBC 2.97 (L) 10/17/2021 0922   HGB 7.8 (L) 10/17/2021 0922   HCT 25.1 (L) 10/17/2021 0922   PLT 44 (L) 10/17/2021 0922   MCV 84.5 10/17/2021 0922   MCH 26.3 10/17/2021 0922   MCHC 31.1 10/17/2021 0922   RDW 21.0 (H) 10/17/2021 0922   LYMPHSABS 1.4 10/16/2021 0530   MONOABS 7.8 (H) 10/16/2021 0530   EOSABS 0.0 10/16/2021 0530  BASOSABS 0.1 10/16/2021 0530   -------------------------------  Imaging from last 24 hours (if applicable):    Most recent imaging reviewed by me and not relevant to today's visit.   Wrap-Up:    All questions were answered. The patient knows to call the clinic with any problems, questions or concerns.  Medical decision making: High (decision regarding hospitalization, goals of care conversation, discussion with hospitalist and general surgeon)  Time spent on visit: I spent 35 minutes counseling the patient face to face. The total time spent in the appointment was 50 minutes and more than 50% was on counseling.   Harriett Rush, PA-C  10/17/21 7:14 PM

## 2021-10-17 NOTE — Procedures (Signed)
[name] Procedure Note  10/17/21  Pre-procedure Diagnosis:  Port hematoma, bacteremia   Post-procedure Diagnosis: Same   Procedure(s) Performed: Excision of port a catheter on right    Surgeon: Lanell Matar. Constance Haw, MD   Assistants: No qualified resident was available    Anesthesia: Lidocaine 1%    Specimens: Culture of hematoma; culture of catheter tip    Estimated Blood Loss: Minimal  Wound Class: Contaminated    Procedure Indications: [name] is a 88 yo with myelodysplastic disorder who has been having bleeding and hematoma at the catheter site on the right and concern for bacteremia with one site positive. We discussed removal and risk of bleeding, infection and wound packing.   Findings: Hematoma, old blood and normal appearing catheter    Procedure: The patient was taken to the procedure room and placed semi upright. The right chest was prepared and draped in the usual sterile fashion. Lidocaine 1% was injected.   An incision was made over the prior port incision and carried down to the port. Hematoma was cultured. The port was excised and the sutures were removed. Pressure was held on the IJ for 15 minutes and the wound was irrigated and made hemostatic with pressure. The catheter tip was sent for culture. The wound was packed with saline dampened gauze and cover with a pressure dressing.   Final inspection revealed acceptable hemostasis. The patient tolerated the procedure well.   CXR was ordered to document port removal.   [name], MD [facility] [address] [place], [place] [address] [phone] (office)

## 2021-10-17 NOTE — Hospital Course (Addendum)
88 year old male with a history of myelodysplastic syndrome, CKD stage IIIb, COPD, GERD, chronic respiratory failure on 2 L, COPD, chronic hypotension on midodrine presented to the [facility] for routine visit today.  The patient was seen at the cancer center on 10/14/2021 secondary to hematoma and possible cellulitis around his right IJ Port-A-Cath site.  Blood cultures were drawn on that day and are currently growing methicillin-resistant Staphylococcus epidermidis.  Blood cultures on 10/12/2021 were negative.  The patient was sent back to the [facility] with cephalexin for 7-day course.  He returned to the clinic today with increasing generalized weakness, and nausea and vomiting of 1 day duration.  The patient feels that he was vomiting some blackish type material on 10/16/2021.  He denies any fevers, chills, chest pain, shortness breath, abdominal pain, dysuria, hematuria.  Notably, the patient recently had his current Port-A-Cath placed on 10/02/2021.  His previous Port-A-Cath was removed on 08/07/2021 due to hematoma and possible infection.  He was treated with Augmentin at that time. The patient noted that he has had progressive pain and swelling about his right-sided Port-A-Cath since 10/10/2021.  When comparing pictures from his previous visit on 10/14/2021, it appears that the patient's edema and hematoma may have progressed. Notably, the patient has had numerous hospital admissions recently.  Patient was recently admitted to the hospital from 5/11 to 5/13, after he suffered a fall and developed a significant gluteal hematoma resulting in blood loss anemia.  He has been transfused multiple units of PRBCs as well as platelets to keep hemoglobin greater than 8 and platelets greater than 50,000.  He was subsequently discharged to [facility] for rehab.  In addition, he had an admission from 09/20/2021 to 09/21/2021 for symptomatic anemia.  He was transfused 2 units PRBC.Marland Kitchen  He was  admitted from 08/09/2021 to 08/11/2021 secondary to bleeding from his left-sided Port-A-Cath removal site.  He was transfused 2 units PRBC at that time.  He also received 1 unit platelets.  10/23/2021: Spoke with daughter today and she has been visiting at bedside and decision made to take him home with hospice in AM. No beds available at [facility] today.    10/24/2021: pt going home with hospice today with daughters

## 2021-10-17 NOTE — Consult Note (Signed)
Gastroenterology Consult   Referring Provider: No ref. provider found Primary Care Physician:  Daneil Dolin, MD Primary Gastroenterologist:  Dr. Gala Romney  Patient ID: [name]; [id number]; Jul 23, 1929   Admit date: 10/17/2021  LOS: 0 days   Date of Consultation: 10/17/2021  Reason for Consultation:  Coffee-ground emesis  History of Present Illness   [name] is a 88 y.o. year old male with history of MDS previously on azacitidine requiring intermittent blood transfusion for oncology, COPD on 3 L of oxygen via nasal cannula at home, CKD, and recent admissions with profound symptomatic anemia. He was admitted in February and discharged 3/1. At that time he had Hgb 5.3, heme positive stool without overt GI bleeding, s/p EGD 2/28 with clotted blood and GE junction biopsied, injected, and clipped, sloughing mucosa in the esophagus, gastritis. He received 5 units PRBCs and discharged with Hgb 9.5. Surgical pathology revealed necroinflammatory debris, detached reactive squamous cells, granular cells and bacterial clumps without overt malignancy.  He returned to the ED on 3/6 with complaint of dizziness, weakness, dyspnea and melanotic stools daily since discharge.  His hemoglobin was again 5.4 and he received 4 units PRBCs and 2 units platelets that admission.  He underwent small bowel enteroscopy with clotted blood firmly adherent to distal esophagus at GE junction, gastritis, duodenitis, localized hemorrhagic mucosa with active bleeding in the proximal jejunum, suspected Dieulafoy lesion or AVM s/p clip placement.  Hemoglobin on last discharge was 9.2.  Since this timeframe he has had multiple ED visits or hospital admissions for symptomatic anemia, pancytopenia, and has also had an infected Port-A-Cath which she is being treated with antibiotics for.  He follows closely with oncology every 2 weeks for his MDS, frequently requires blood transfusions.  He presented to the hospital from clinic  today after endorsing increasing generalized weakness, nausea and vomiting for 1 day.  Also reported vomiting a black fish type material on 10/16/2021.  Hgb 7.8 on admission. GI consulted for further evaluation.   Admission from clinic: Hgb 7.8, platelets 44, will receive 1u platelets.   Consult: Port removed at bedside by general surgery. Patient stated he had bad indigestion yesterday, did not eat anything spicy. He states that he got nauseas and vomited after taking mylanta and when he threw up he said it looked like black water. He denies melena, hematochezia, abdominal pain. He reports feeling fine today and has an appetite. Feels like he can eat something if he is given a diet. No significant fatigue. Denies frequent NSAID use. Reports that he had been feeling fairly well and would like to go back to rehab to get back to walking a little bit better so he can go back home.     Past Medical History:  Diagnosis Date   Bone cancer ([place])    Chronic kidney insufficiency    COPD (chronic obstructive pulmonary disease) ([facility])    GERD (gastroesophageal reflux disease)    Pulmonary nodule     Past Surgical History:  Procedure Laterality Date   BACK SURGERY     BIOPSY  07/15/2021   Procedure: BIOPSY;  Surgeon: Harvel Quale, MD;  [facility];  Service: Gastroenterology;;   CATARACT EXTRACTION W/PHACO Right 03/09/2016   Procedure: CATARACT EXTRACTION PHACO AND INTRAOCULAR LENS PLACEMENT RIGHT EYE CDE=8.58;  Surgeon: Tonny Branch, MD;  Location: [facility];  Service: Ophthalmology;  Laterality: Right;  right   CATARACT EXTRACTION W/PHACO Left 04/13/2016   Procedure: CATARACT EXTRACTION PHACO AND INTRAOCULAR  LENS PLACEMENT (IOC);  Surgeon: Tonny Branch, MD;  Location: [facility];  Service: Ophthalmology;  Laterality: Left;  CDE: 8.08   ENTEROSCOPY  07/23/2021   Procedure: ENTEROSCOPY;  Surgeon: Harvel Quale, MD;  [facility];  Service: Gastroenterology;;    ESOPHAGOGASTRODUODENOSCOPY (EGD) WITH PROPOFOL N/A 07/14/2021   Procedure: ESOPHAGOGASTRODUODENOSCOPY (EGD) WITH PROPOFOL;  Surgeon: Eloise Harman, DO;  [facility];  Service: Endoscopy;  Laterality: N/A;   ESOPHAGOGASTRODUODENOSCOPY (EGD) WITH PROPOFOL N/A 07/15/2021   Procedure: ESOPHAGOGASTRODUODENOSCOPY (EGD) WITH PROPOFOL;  Surgeon: Harvel Quale, MD;  [facility];  Service: Gastroenterology;  Laterality: N/A;   ESOPHAGOGASTRODUODENOSCOPY (EGD) WITH PROPOFOL N/A 07/23/2021   Procedure: ESOPHAGOGASTRODUODENOSCOPY (EGD) WITH PROPOFOL;  Surgeon: Harvel Quale, MD;  [facility];  Service: Gastroenterology;  Laterality: N/A;   HEMOSTASIS CLIP PLACEMENT  07/15/2021   Procedure: HEMOSTASIS CLIP PLACEMENT;  Surgeon: Harvel Quale, MD;  [facility];  Service: Gastroenterology;;   HEMOSTASIS CLIP PLACEMENT  07/23/2021   Procedure: HEMOSTASIS CLIP PLACEMENT;  Surgeon: Harvel Quale, MD;  [facility];  Service: Gastroenterology;;   KYPHOPLASTY Bilateral 06/09/2019   Procedure: T11 KYPHOPLASTY;  Surgeon: Consuella Lose, MD;  Location: [place];  Service: Neurosurgery;  Laterality: Bilateral;   None to Date  10/01/15   [facility] REMOVAL Left 08/07/2021   Procedure: MINOR REMOVAL PORT-A-CATH;  Surgeon: Virl Cagey, MD;  Location: [facility];  Service: General;  Laterality: Left;  lidocaine 1%   PORTACATH PLACEMENT Left 10/11/2020   Procedure: INSERTION PORT-A-CATH (attached catheter in left internal jugular);  Surgeon: Virl Cagey, MD;  Location: [facility];  Service: General;  Laterality: Left;   PORTACATH PLACEMENT Right 10/02/2021   Procedure: INSERTION PORT-A-CATH;  Surgeon: Virl Cagey, MD;  Location: [facility];  Service: General;  Laterality: Right;    Prior to Admission medications   Medication Sig Start Date End Date Taking? Authorizing Provider  acetaminophen (TYLENOL) 650 MG CR  tablet Take 650 mg by mouth every 6 (six) hours.    [provider]  albuterol (VENTOLIN HFA) 108 (90 Base) MCG/ACT inhaler Inhale 2 puffs into the lungs every 4 (four) hours as needed for wheezing or shortness of breath. 04/04/20   Roxan Hockey, MD  cephALEXin (KEFLEX) 500 MG capsule Take 1 capsule (500 mg total) by mouth 3 (three) times daily for 7 days. 10/14/21 10/21/21  Harriett Rush, PA-C  midodrine (PROAMATINE) 5 MG tablet Take 1 tablet (5 mg total) by mouth 3 (three) times daily with meals. 09/21/21   Dessa Phi, DO  oxyCODONE (OXY IR/ROXICODONE) 5 MG immediate release tablet Take 1 tablet (5 mg total) by mouth every 6 (six) hours as needed for severe pain. 10/16/21   Gerlene Fee, NP  predniSONE (DELTASONE) 5 MG tablet Take 5 mg by mouth daily with breakfast.    [provider]  traMADol (ULTRAM) 50 MG tablet Take 1 tablet (50 mg total) by mouth every 8 (eight) hours as needed for moderate pain. 10/07/21   Gerlene Fee, NP  umeclidinium-vilanterol (ANORO ELLIPTA) 62.5-25 MCG/ACT AEPB Inhale 1 puff into the lungs daily. 08/29/21   Tanda Rockers, MD    Current Facility-Administered Medications  Medication Dose Route Frequency Provider Last Rate Last Admin   [START ON 10/18/2021] Chlorhexidine Gluconate Cloth 2 % PADS 6 each  6 each Topical Q0600 Tat, David, MD       lidocaine (PF) (XYLOCAINE) 1 % injection 30  mL  30 mL Intradermal Once Virl Cagey, MD       Facility-Administered Medications Ordered in Other Encounters  Medication Dose Route Frequency Provider Last Rate Last Admin   0.9 %  sodium chloride infusion   Intravenous Continuous Derek Jack, MD   Stopped at 10/02/21 1100   0.9 %  sodium chloride infusion   Intravenous Continuous Pennington, Rebekah M, PA-C   Stopped at 10/17/21 1156   octreotide (SANDOSTATIN LAR) 30 MG IM injection            sodium chloride flush (NS) 0.9 % injection 10 mL  10 mL Intracatheter PRN Derek Jack, MD   10 mL at 01/06/21 1215   sodium chloride flush (NS) 0.9 % injection 10 mL  10 mL Intravenous PRN Derek Jack, MD   10 mL at 02/17/21 0902   sodium chloride flush (NS) 0.9 % injection 10 mL  10 mL Intravenous PRN Derek Jack, MD   10 mL at 03/10/21 0936    Allergies as of 10/17/2021   (No Known Allergies)    Family History  Problem Relation Age of Onset   Kidney disease Mother    Kidney disease Sister    Colon cancer Neg Hx    Gastric cancer Neg Hx    Esophageal cancer Neg Hx     Social History   Socioeconomic History   Marital status: Widowed    Spouse name: Not on file   Number of children: Not on file   Years of education: Not on file   Highest education level: Not on file  Occupational History   Not on file  Tobacco Use   Smoking status: Former    Years: 15.00    Types: Cigarettes, Cigars    Quit date: 10/01/1990    Years since quitting: 31.0   Smokeless tobacco: Never   Tobacco comments:    Quit x 25-30 years; 2 cigars daily when smoked  Vaping Use   Vaping Use: Never used  Substance and Sexual Activity   Alcohol use: Not Currently    Alcohol/week: 0.0 standard drinks   Drug use: No   Sexual activity: Never    Birth control/protection: None  Other Topics Concern   Not on file  Social History Narrative   Not on file   Social Determinants of Health   Financial Resource Strain: Not on file  Food Insecurity: Not on file  Transportation Needs: Not on file  Physical Activity: Not on file  Stress: Not on file  Social Connections: Not on file  Intimate Partner Violence: Not on file     Review of Systems   Gen: Denies any fever, chills, loss of appetite, change in weight or weight loss CV: Denies chest pain, heart palpitations, syncope, edema  Resp: Denies shortness of breath with rest, cough, wheezing, coughing up blood, and pleurisy. GI: see HPI GU : Denies urinary burning, blood in urine, urinary frequency, and urinary  incontinence. MS: Denies joint pain, limitation of movement, swelling, cramps, and atrophy.  Derm: Denies rash, itching, dry skin, hives. Psych: Denies depression, anxiety, memory loss, hallucinations, and confusion. Heme: Denies bruising or bleeding Neuro:  Denies any headaches, dizziness, paresthesias, shaking  Physical Exam   Vital Signs in last 24 hours: Temp:  [97.4 F (36.3 C)-98 F (36.7 C)] 97.4 F (36.3 C) (06/02 1209) Pulse Rate:  [94] 94 (06/02 0950) Resp:  [18] 18 (06/02 0950) BP: (91)/(53) 91/53 (06/02 0950) SpO2:  [95 %] 95 % (  06/02 0950) Weight:  [53.1 kg] 53.1 kg (06/02 1200) Last BM Date : 10/14/21 (per patient)  General:   Alert,  thin, pleasant, NAD Head:  Normocephalic and atraumatic. Eyes:  Sclera clear, no icterus.   Conjunctiva pink. Ears:  Normal auditory acuity. Neck:  Supple; no masses Lungs:  Clear throughout to auscultation.   No wheezes, crackles, or rhonchi. No acute distress. Heart:  Regular rate and rhythm; no murmurs, clicks, rubs,  or gallops. Abdomen:  Soft, nontender and nondistended. No masses, hepatosplenomegaly or hernias noted. Normal bowel sounds, without guarding, and without rebound.   Rectal: deferred   Msk:  Symmetrical without gross deformities. Normal posture. Extremities:  Without clubbing or edema. Neurologic:  Alert and  oriented x4. Skin:  scattered bruising, large bruising/hematoma to right chest, port a cath now removed. Psych:  Alert and cooperative. Normal mood and affect.  Intake/Output from previous day: No intake/output data recorded. Intake/Output this shift: No intake/output data recorded.  Labs/Studies   Recent Labs Recent Labs    10/16/21 0530 10/17/21 0922  WBC 23.7* 38.5*  HGB 7.6* 7.8*  HCT 24.1* 25.1*  PLT 38* 44*   BMET No results for input(s): NA, K, CL, CO2, GLUCOSE, BUN, CREATININE, CALCIUM in the last 72 hours. LFT No results for input(s): PROT, ALBUMIN, AST, ALT, ALKPHOS, BILITOT, BILIDIR,  IBILI in the last 72 hours. PT/INR No results for input(s): LABPROT, INR in the last 72 hours. Hepatitis Panel No results for input(s): HEPBSAG, HCVAB, HEPAIGM, HEPBIGM in the last 72 hours. C-Diff No results for input(s): CDIFFTOX in the last 72 hours.  Radiology/Studies No results found.   Assessment   [name] is a 88 y.o. year old male with history of MDS previously on azacitidine requiring intermittent blood transfusion and follows with oncology, COPD on 3 L of oxygen via nasal cannula at home, CKD, and multiple admissions with profound symptomatic anemia s/p EGD and enteroscopy with intervention. Presented to the hospital today from clinic due to weakness, nausea, vomiting, and suspected enlarging infected hematoma of Port-a-cath. GI consulted for evaluation of reported coffee ground emesis.   Acute on Chronic Anemia: Intermittently symptomatic. Hgb 7.8 on admission. This appears to be around baseline for the patient. Recent EGD and enteroscopy as outlined above. During previous admissions he was started on PPI BID which he has continued outpatient. He remains thrombocytopenic with platelets 44, will be receiving 1u platelets (chronic secondary to MDS). Current concern for port a cath hematoma and infection, will likely have removed ASAP. Follows with oncology regularly and requires intermittent blood transfusions. No overt signs of G bleeding at this time. Continue to monitor H/H and transfuse as needed for Hgb <7.  Coffee ground emesis: Reported one day of emesis prior to arrival. Emesis reported as one episode of black water after getting nauseas post ingestion of mylanta. He had indigestion and was told to take a dose of mylanta to improve his symptoms. He denied any spicy foods. Denies ongoing abdominal pain and would like to eat something. He knows to make someone aware if this occurs again.   Plan / Recommendations   Continue PPI BID Trend H&H daily Continue to monitor  for signs of overt GI bleeding. Continue supportive measures Diet as tolerated, if regular foods would do GI soft.  No plans for endoscopic evaluation at this time, continue to reassess.     10/17/2021, 1:09 PM  Venetia Night, MSN, FNP-BC, AGACNP-BC [facility]

## 2021-10-17 NOTE — Progress Notes (Signed)
Anoro has been placed in room for am start.

## 2021-10-17 NOTE — Progress Notes (Unsigned)
Report called to Willeen Cass, RN, accepting RN in ICU. Patient to be transported to [facility].

## 2021-10-17 NOTE — Progress Notes (Unsigned)
Patient came to clinic today for lab check poss blood products. On arrival patient complaining of sharp intermittent pain in his left hip and down leg. This is the side where he fell on in the past. Vitals stable , patient stated "this is the worst he has ever felt in his life. Patient did state he had been vomiting black liquid all night. Casey Burkitt PA-C notified and came in room to evaluate. Labs reviewed and will give one unit of platelets per orders.    1046, gave morphine as ordered at 1034, patient states the pain is easing off now.

## 2021-10-17 NOTE — H&P (Signed)
History and Physical    Patient: [name] BVQ:[id number] DOB: 1929/11/11 DOA: (Not on file) DOS: the patient was seen and examined on 10/17/2021 PCP: Daneil Dolin, MD  Patient coming from: SNF  Chief Complaint: No chief complaint on file.  HPI: [name] is a 88 year old male with a history of myelodysplastic syndrome, CKD stage IIIb, COPD, GERD, chronic respiratory failure on 2 L, COPD presented to the [facility] for routine visit today.  The patient was seen at the cancer center on 10/14/2021 secondary to hematoma and possible cellulitis around his right IJ Port-A-Cath site.  Blood cultures were drawn on that day and are currently growing methicillin-resistant Staphylococcus epidermidis.  Blood cultures on 10/12/2021 were negative.  The patient was sent back to the [facility] with cephalexin for 7-day course.  He returned to the clinic today with increasing generalized weakness, and nausea and vomiting of 1 day duration.  The patient feels that he was vomiting some blackish type material on 10/16/2021.  He denies any fevers, chills, chest pain, shortness breath, abdominal pain, dysuria, hematuria.  Notably, the patient recently had his current Port-A-Cath placed on 10/02/2021.  His previous Port-A-Cath was removed on 08/07/2021 due to hematoma and possible infection.  He was treated with Augmentin at that time. The patient noted that he has had progressive pain and swelling about his right-sided Port-A-Cath since 10/10/2021.  When comparing pictures from his previous visit on 10/14/2021, it appears that the patient's edema and hematoma may have progressed. Notably, the patient has had numerous hospital admissions recently.  Patient was recently admitted to the hospital from 5/11 to 5/13, after he suffered a fall and developed a significant gluteal hematoma resulting in blood loss anemia.  He has been transfused multiple units of PRBCs as well as platelets to keep  hemoglobin greater than 8 and platelets greater than 50,000.  He was subsequently discharged to [facility] for rehab. In addition, he had an admission from 09/20/2021 to 09/21/2021 for symptomatic anemia.  He was transfused 2 units PRBC.Marland Kitchen  He was admitted from 08/09/2021 to 08/11/2021 secondary to bleeding from his left-sided Port-A-Cath removal site.  He was transfused 2 units PRBC at that time.  He also received 1 unit platelets.  Review of Systems: As mentioned in the history of present illness. All other systems reviewed and are negative. Past Medical History:  Diagnosis Date   Bone cancer ([facility])    Chronic kidney insufficiency    COPD (chronic obstructive pulmonary disease) (HCC)    GERD (gastroesophageal reflux disease)    Pulmonary nodule    Past Surgical History:  Procedure Laterality Date   BACK SURGERY     BIOPSY  07/15/2021   Procedure: BIOPSY;  Surgeon: Harvel Quale, MD;  [facility];  Service: Gastroenterology;;   CATARACT EXTRACTION W/PHACO Right 03/09/2016   Procedure: CATARACT EXTRACTION PHACO AND INTRAOCULAR LENS PLACEMENT RIGHT EYE CDE=8.58;  Surgeon: Tonny Branch, MD;  Location: [facility];  Service: Ophthalmology;  Laterality: Right;  right   CATARACT EXTRACTION W/PHACO Left 04/13/2016   Procedure: CATARACT EXTRACTION PHACO AND INTRAOCULAR LENS PLACEMENT (IOC);  Surgeon: Tonny Branch, MD;  Location: [facility];  Service: Ophthalmology;  Laterality: Left;  CDE: 8.08   ENTEROSCOPY  07/23/2021   Procedure: ENTEROSCOPY;  Surgeon: Harvel Quale, MD;  [facility];  Service: Gastroenterology;;   ESOPHAGOGASTRODUODENOSCOPY (EGD) WITH PROPOFOL N/A 07/14/2021   Procedure: ESOPHAGOGASTRODUODENOSCOPY (EGD) WITH PROPOFOL;  Surgeon: Abbey Chatters,  Elon Alas, DO;  [facility];  Service: Endoscopy;  Laterality: N/A;   ESOPHAGOGASTRODUODENOSCOPY (EGD) WITH PROPOFOL N/A 07/15/2021   Procedure: ESOPHAGOGASTRODUODENOSCOPY (EGD) WITH PROPOFOL;  Surgeon:  Harvel Quale, MD;  [facility];  Service: Gastroenterology;  Laterality: N/A;   ESOPHAGOGASTRODUODENOSCOPY (EGD) WITH PROPOFOL N/A 07/23/2021   Procedure: ESOPHAGOGASTRODUODENOSCOPY (EGD) WITH PROPOFOL;  Surgeon: Harvel Quale, MD;  [facility];  Service: Gastroenterology;  Laterality: N/A;   HEMOSTASIS CLIP PLACEMENT  07/15/2021   Procedure: HEMOSTASIS CLIP PLACEMENT;  Surgeon: Harvel Quale, MD;  [facility];  Service: Gastroenterology;;   HEMOSTASIS CLIP PLACEMENT  07/23/2021   Procedure: HEMOSTASIS CLIP PLACEMENT;  Surgeon: Harvel Quale, MD;  [facility];  Service: Gastroenterology;;   KYPHOPLASTY Bilateral 06/09/2019   Procedure: T11 KYPHOPLASTY;  Surgeon: Consuella Lose, MD;  Location: [place];  Service: Neurosurgery;  Laterality: Bilateral;   None to Date  10/01/15   [facility] REMOVAL Left 08/07/2021   Procedure: MINOR REMOVAL PORT-A-CATH;  Surgeon: Virl Cagey, MD;  Location: [facility];  Service: General;  Laterality: Left;  lidocaine 1%   PORTACATH PLACEMENT Left 10/11/2020   Procedure: INSERTION PORT-A-CATH (attached catheter in left internal jugular);  Surgeon: Virl Cagey, MD;  Location: [facility];  Service: General;  Laterality: Left;   PORTACATH PLACEMENT Right 10/02/2021   Procedure: INSERTION PORT-A-CATH;  Surgeon: Virl Cagey, MD;  Location: [facility];  Service: General;  Laterality: Right;   Social History:  reports that he quit smoking about 31 years ago. His smoking use included cigarettes and cigars. He has never used smokeless tobacco. He reports that he does not currently use alcohol. He reports that he does not use drugs.  No Known Allergies  Family History  Problem Relation Age of Onset   Kidney disease Mother    Kidney disease Sister    Colon cancer Neg Hx    Gastric cancer Neg Hx    Esophageal cancer Neg Hx     Prior to Admission medications   Medication  Sig Start Date End Date Taking? Authorizing Provider  acetaminophen (TYLENOL) 650 MG CR tablet Take 650 mg by mouth every 6 (six) hours.    [provider]  albuterol (VENTOLIN HFA) 108 (90 Base) MCG/ACT inhaler Inhale 2 puffs into the lungs every 4 (four) hours as needed for wheezing or shortness of breath. 04/04/20   Roxan Hockey, MD  cephALEXin (KEFLEX) 500 MG capsule Take 1 capsule (500 mg total) by mouth 3 (three) times daily for 7 days. 10/14/21 10/21/21  Harriett Rush, PA-C  midodrine (PROAMATINE) 5 MG tablet Take 1 tablet (5 mg total) by mouth 3 (three) times daily with meals. 09/21/21   Dessa Phi, DO  oxyCODONE (OXY IR/ROXICODONE) 5 MG immediate release tablet Take 1 tablet (5 mg total) by mouth every 6 (six) hours as needed for severe pain. 10/16/21   Gerlene Fee, NP  predniSONE (DELTASONE) 5 MG tablet Take 5 mg by mouth daily with breakfast.    [provider]  traMADol (ULTRAM) 50 MG tablet Take 1 tablet (50 mg total) by mouth every 8 (eight) hours as needed for moderate pain. 10/07/21   Gerlene Fee, NP  umeclidinium-vilanterol (ANORO ELLIPTA) 62.5-25 MCG/ACT AEPB Inhale 1 puff into the lungs daily. 08/29/21   Tanda Rockers, MD    Physical Exam: GENERAL:  A&O x 3, NAD, well developed, cooperative, follows commands HEENT: [place]/AT, No thrush, No icterus,  No oral ulcers Neck:  No neck mass, No meningismus, soft, supple CV: RRR, no S3, no S4, no rub, no JVD Lungs:  diminished BS, but CTA, no wheeze, no rhonchi, good air movement Abd: soft/NT +BS, nondistended Ext: No edema, no lymphangitis, no cyanosis, no rashes + hematoma of R-port site Neuro:  CN II-XII intact, strength 4/5 in RUE, RLE, strength 4/5 LUE, LLE; sensation intact bilateral; no dysmetria; babinski equivocal  Data Reviewed: Data reviewed in histsory  Assessment and Plan: Right IJ Port-A-Cath hematoma -Concerned about infected hematoma -General surgery consulted; discussed with Dr.  Constance Haw -Plan for Port-A-Cath removal ultimately -Repeat blood cultures x2 sets  Coffee-ground emesis -Start pantoprazole IV twice daily 3/8 EGD--Localized hemorrhagic mucosa with active bleeding and with stigmata of bleeding was found in the proximal jejunum. Upon lavage it looked as a vascularized area, ?possible Dieulafoy vs AVM.  Hemoclip placed -Consult GI  Acute on chronic anemia Baseline hemoglobin with wide variation, but usually ~8 range -Type and screen  Myelodysplastic syndrome [facility]) Follows Dr. Delton Coombes 07/09/21 follow up with Dr. Delton Coombes states treatment of MDS on hold due to hospitalizations for syncope Previously on Azacitidine 09/16/20 to 04/21/21  Thrombocytopenia ([place]) Has been chronic due to MDS,  30-40K range  CKD (chronic kidney disease), stage IIIB Baseline creatinine 1.5-1.8 Serial BMPs   COPD (chronic obstructive pulmonary disease) (HCC) Baseline 3 L oxygen requirement Continue albuterol as needed Stable  Chronic hypotension -Continue on midodrine  Fall with left gluteal hematoma -currently at [facility] for rehab Left hip hematoma appears to be involuting   Advance Care Planning: DNR  Consults: general surgery, Dr. Constance Haw  Family Communication: none  Severity of Illness: The appropriate patient status for this patient is INPATIENT. Inpatient status is judged to be reasonable and necessary in order to provide the required intensity of service to ensure the patient's safety. The patient's presenting symptoms, physical exam findings, and initial radiographic and laboratory data in the context of their chronic comorbidities is felt to place them at high risk for further clinical deterioration. Furthermore, it is not anticipated that the patient will be medically stable for discharge from the hospital within 2 midnights of admission.   * I certify that at the point of admission it is my clinical judgment that the patient will require  inpatient hospital care spanning beyond 2 midnights from the point of admission due to high intensity of service, high risk for further deterioration and high frequency of surveillance required.*  Author: Orson Eva, MD 10/17/2021 11:32 AM  For on call review [email or web address].

## 2021-10-18 ENCOUNTER — Encounter (HOSPITAL_COMMUNITY): Payer: Self-pay | Admitting: Internal Medicine

## 2021-10-18 DIAGNOSIS — K2100 Gastro-esophageal reflux disease with esophagitis, without bleeding: Secondary | ICD-10-CM | POA: Diagnosis not present

## 2021-10-18 DIAGNOSIS — N1832 Chronic kidney disease, stage 3b: Secondary | ICD-10-CM | POA: Diagnosis not present

## 2021-10-18 DIAGNOSIS — D62 Acute posthemorrhagic anemia: Secondary | ICD-10-CM | POA: Diagnosis not present

## 2021-10-18 DIAGNOSIS — D469 Myelodysplastic syndrome, unspecified: Secondary | ICD-10-CM | POA: Diagnosis not present

## 2021-10-18 DIAGNOSIS — R6510 Systemic inflammatory response syndrome (SIRS) of non-infectious origin without acute organ dysfunction: Secondary | ICD-10-CM | POA: Diagnosis not present

## 2021-10-18 LAB — BPAM PLATELET PHERESIS
Blood Product Expiration Date: 202306052359
ISSUE DATE / TIME: 202306021536
Unit Type and Rh: 5100

## 2021-10-18 LAB — CBC
HCT: 20.6 % — ABNORMAL LOW (ref 39.0–52.0)
Hemoglobin: 6.3 g/dL — CL (ref 13.0–17.0)
MCH: 25.8 pg — ABNORMAL LOW (ref 26.0–34.0)
MCHC: 30.6 g/dL (ref 30.0–36.0)
MCV: 84.4 fL (ref 80.0–100.0)
Platelets: 62 10*3/uL — ABNORMAL LOW (ref 150–400)
RBC: 2.44 MIL/uL — ABNORMAL LOW (ref 4.22–5.81)
RDW: 21.1 % — ABNORMAL HIGH (ref 11.5–15.5)
WBC: 44.8 10*3/uL — ABNORMAL HIGH (ref 4.0–10.5)
nRBC: 0.8 % — ABNORMAL HIGH (ref 0.0–0.2)

## 2021-10-18 LAB — CULTURE, BLOOD (ROUTINE X 2)
Culture: NO GROWTH
Culture: NO GROWTH
Special Requests: ADEQUATE
Special Requests: ADEQUATE

## 2021-10-18 LAB — MAGNESIUM: Magnesium: 1.8 mg/dL (ref 1.7–2.4)

## 2021-10-18 LAB — HEMOGLOBIN AND HEMATOCRIT, BLOOD
HCT: 21 % — ABNORMAL LOW (ref 39.0–52.0)
HCT: 31.6 % — ABNORMAL LOW (ref 39.0–52.0)
Hemoglobin: 6.3 g/dL — CL (ref 13.0–17.0)
Hemoglobin: 10 g/dL — ABNORMAL LOW (ref 13.0–17.0)

## 2021-10-18 LAB — PREPARE PLATELET PHERESIS: Unit division: 0

## 2021-10-18 LAB — BASIC METABOLIC PANEL
Anion gap: 3 — ABNORMAL LOW (ref 5–15)
BUN: 42 mg/dL — ABNORMAL HIGH (ref 8–23)
CO2: 21 mmol/L — ABNORMAL LOW (ref 22–32)
Calcium: 8.3 mg/dL — ABNORMAL LOW (ref 8.9–10.3)
Chloride: 111 mmol/L (ref 98–111)
Creatinine, Ser: 2.19 mg/dL — ABNORMAL HIGH (ref 0.61–1.24)
GFR, Estimated: 28 mL/min — ABNORMAL LOW (ref >60)
Glucose, Bld: 87 mg/dL (ref 70–99)
Potassium: 3.9 mmol/L (ref 3.5–5.1)
Sodium: 135 mmol/L (ref 135–145)

## 2021-10-18 LAB — PREPARE RBC (CROSSMATCH)

## 2021-10-18 LAB — PROCALCITONIN: Procalcitonin: 0.56 ng/mL

## 2021-10-18 MED ORDER — SODIUM CHLORIDE 0.9% IV SOLUTION: Freq: Once | INTRAVENOUS | Status: DC

## 2021-10-18 MED ORDER — HYDROCODONE-ACETAMINOPHEN 5-325 MG PO TABS
1.0000 | ORAL_TABLET | ORAL | Status: DC | PRN
  Administered 2021-10-19 – 2021-10-24 (×3): 1 via ORAL
  Filled 2021-10-18 (×3): qty 1

## 2021-10-18 MED ORDER — ORAL CARE MOUTH RINSE
15.0000 mL | Freq: Two times a day (BID) | OROMUCOSAL | Status: DC
  Administered 2021-10-18 – 2021-10-24 (×13): 15 mL via OROMUCOSAL

## 2021-10-18 NOTE — Plan of Care (Signed)

## 2021-10-18 NOTE — Progress Notes (Signed)
[name]  Spoke with the patient's nurse, states that right chest port site dressing was changed overnight.  There was a fair amount of bleeding and serosanguineous output per the overnight nurse, but he was able to control bleeding.  Given dressing change earlier this morning, dressing was not taken down.  Minimal serosanguineous strikethrough at the inferior aspect of the dressing.  Patient currently receiving unit of blood for hemoglobin of 6.3 this morning.  Please call with any concerns regarding further bleeding from the chest port site.  Graciella Freer, DO [facility] [address] [place], [place] [address] [phone] (office)

## 2021-10-18 NOTE — Progress Notes (Signed)
PROGRESS NOTE  [name] ZHY:[id number] DOB: 02-02-1930 DOA: 10/17/2021 PCP: Daneil Dolin, MD  Brief History:  88 year old male with a history of myelodysplastic syndrome, CKD stage IIIb, COPD, GERD, chronic respiratory failure on 2 L, COPD, chronic hypotension on midodrine presented to the [facility] for routine visit today.  The patient was seen at the cancer center on 10/14/2021 secondary to hematoma and possible cellulitis around his right IJ Port-A-Cath site.  Blood cultures were drawn on that day and are currently growing methicillin-resistant Staphylococcus epidermidis.  Blood cultures on 10/12/2021 were negative.  The patient was sent back to the [facility] with cephalexin for 7-day course.  He returned to the clinic today with increasing generalized weakness, and nausea and vomiting of 1 day duration.  The patient feels that he was vomiting some blackish type material on 10/16/2021.  He denies any fevers, chills, chest pain, shortness breath, abdominal pain, dysuria, hematuria.  Notably, the patient recently had his current Port-A-Cath placed on 10/02/2021.  His previous Port-A-Cath was removed on 08/07/2021 due to hematoma and possible infection.  He was treated with Augmentin at that time. The patient noted that he has had progressive pain and swelling about his right-sided Port-A-Cath since 10/10/2021.  When comparing pictures from his previous visit on 10/14/2021, it appears that the patient's edema and hematoma may have progressed. Notably, the patient has had numerous hospital admissions recently.  Patient was recently admitted to the hospital from 5/11 to 5/13, after he suffered a fall and developed a significant gluteal hematoma resulting in blood loss anemia.  He has been transfused multiple units of PRBCs as well as platelets to keep hemoglobin greater than 8 and platelets greater than 50,000.  He was subsequently discharged to [facility] for  rehab. In addition, he had an admission from 09/20/2021 to 09/21/2021 for symptomatic anemia.  He was transfused 2 units PRBC.Marland Kitchen  He was admitted from 08/09/2021 to 08/11/2021 secondary to bleeding from his left-sided Port-A-Cath removal site.  He was transfused 2 units PRBC at that time.  He also received 1 unit platelets.   Assessment/Plan:  Right IJ Port-A-Cath hematoma -Concerned about infected hematoma -General surgery consulted; discussed with Dr. Constance Haw -Plan for Port-A-Cath removal---done on 10/17/21 -Repeat blood cultures x2 sets--neg to date -follow up cultures from removed port-a-cath -continue empiric vanc and cefepime   Coffee-ground emesis -Start pantoprazole IV twice daily>>continue 3/8 EGD--Localized hemorrhagic mucosa with active bleeding and with stigmata of bleeding was found in the proximal jejunum. Upon lavage it looked as a vascularized area, ?possible Dieulafoy vs AVM.  Hemoclip placed -Consult GI appreciated>>continue PPI bid, treat conservatively   Acute on chronic anemia Baseline hemoglobin with wide variation, but usually ~8 range -Transfused 2 units PRBC 10/18/21   Myelodysplastic syndrome (HCC) Follows Dr. Delton Coombes -follow up with Dr. Delton Coombes states treatment of MDS on hold due to hospitalizations for syncope Previously on Azacitidine 09/16/20 to 04/21/21 --WBC continues to climb>>add diff to CBC   Thrombocytopenia (HCC) Has been chronic due to MDS,  30-40K range   CKD (chronic kidney disease), stage IIIB Baseline creatinine 1.5-1.8 Serial BMPs   COPD (chronic obstructive pulmonary disease) (HCC) Baseline 3 L oxygen requirement Continue albuterol as needed Stable   Chronic hypotension -Continue on midodrine   Fall with left gluteal hematoma -currently at [facility] for rehab Left hip hematoma appears to be involuting    Family Communication:  daughter updated at bedside 6/3  Consultants:  GI  Code Status:  DNR  DVT Prophylaxis:   SCDs   Procedures: As Listed in Progress Note Above  Antibiotics: Vanc 6/2>> Cefepime 6/2>>      Subjective: Patient denies fevers, chills, headache, chest pain, dyspnea, nausea, vomiting, diarrhea, abdominal pain, dysuria, hematuria, hematochezia, and melena.   Objective: Vitals:   10/18/21 1200 10/18/21 1300 10/18/21 1305 10/18/21 1400  BP: (!) 102/43 103/60 (!) 112/46 (!) 113/55  Pulse: 79 77 79 78  Resp: '18 17 17 16  '$ Temp:   98.7 F (37.1 C)   TempSrc:   Oral   SpO2: 94% 96% 96% 96%  Weight:      Height:        Intake/Output Summary (Last 24 hours) at 10/18/2021 1515 Last data filed at 10/18/2021 1305 Gross per 24 hour  Intake 2360.25 ml  Output 650 ml  Net 1710.25 ml   Weight change:  Exam:  General:  Pt is alert, follows commands appropriately, not in acute distress HEENT: No icterus, No thrush, No neck mass, [place]/AT Cardiovascular: RRR, S1/S2, no rubs, no gallops Respiratory: diminished BS but CTA bilaterally, no wheezing, no crackles, no rhonchi Abdomen: Soft/+BS, non tender, non distended, no guarding Extremities: No edema, No lymphangitis, No petechiae, No rashes, no synovitis   Data Reviewed: I have personally reviewed following labs and imaging studies Basic Metabolic Panel: Recent Labs  Lab 10/12/21 1049 10/17/21 1433 10/18/21 0229  NA 137 135 135  K 4.2 4.3 3.9  CL 106 108 111  CO2 25 22 21*  GLUCOSE 101* 102* 87  BUN 44* 42* 42*  CREATININE 2.36* 2.20* 2.19*  CALCIUM 8.9 9.1 8.3*  MG 2.0  --  1.8   Liver Function Tests: Recent Labs  Lab 10/12/21 1049  AST 58*  ALT 33  ALKPHOS 86  BILITOT 1.7*  PROT 7.2  ALBUMIN 3.1*   No results for input(s): LIPASE, AMYLASE in the last 168 hours. No results for input(s): AMMONIA in the last 168 hours. Coagulation Profile: Recent Labs  Lab 10/12/21 1112  INR 1.1   CBC: Recent Labs  Lab 10/12/21 0700 10/12/21 1112 10/14/21 1040 10/16/21 0530 10/17/21 0922 10/18/21 0038  10/18/21 0229 10/18/21 1410  WBC 20.6* 23.0* 25.4* 23.7* 38.5*  --  44.8*  --   NEUTROABS 11.7* 11.5*  --  10.5*  --   --   --   --   HGB 6.8* 6.3* 8.7* 7.6* 7.8* 6.3* 6.3* 10.0*  HCT 21.6* 19.8* 27.5* 24.1* 25.1* 21.0* 20.6* 31.6*  MCV 85.4 83.9 83.1 83.4 84.5  --  84.4  --   PLT 41* 45* 44* 38* 44*  --  62*  --    Cardiac Enzymes: No results for input(s): CKTOTAL, CKMB, CKMBINDEX, TROPONINI in the last 168 hours. BNP: Invalid input(s): POCBNP CBG: No results for input(s): GLUCAP in the last 168 hours. HbA1C: No results for input(s): HGBA1C in the last 72 hours. Urine analysis:    Component Value Date/Time   COLORURINE YELLOW 10/17/2021 1351   APPEARANCEUR HAZY (A) 10/17/2021 1351   APPEARANCEUR Hazy (A) 03/26/2021 1334   LABSPEC 1.015 10/17/2021 1351   PHURINE 5.0 10/17/2021 1351   GLUCOSEU NEGATIVE 10/17/2021 1351   HGBUR MODERATE (A) 10/17/2021 1351   BILIRUBINUR NEGATIVE 10/17/2021 1351   BILIRUBINUR Negative 03/26/2021 1334   KETONESUR NEGATIVE 10/17/2021 1351   PROTEINUR 100 (A) 10/17/2021 1351   UROBILINOGEN 0.2 08/15/2013 1240   NITRITE NEGATIVE 10/17/2021  [place] 10/17/2021 1351   Sepsis Labs: '@[email or web address]'$ (procalcitonin:4,lacticidven:4) ) Recent Results (from the past 240 hour(s))  Blood Culture (routine x 2)     Status: None   Collection Time: 10/12/21 11:19 AM   Specimen: BLOOD RIGHT FOREARM  Result Value Ref Range Status   Specimen Description   Final    BLOOD RIGHT FOREARM BOTTLES DRAWN AEROBIC AND ANAEROBIC   Special Requests Blood Culture adequate volume  Final   Culture   Final    NO GROWTH 6 DAYS Performed at [facility], [address], [place], [place] [address]    Report Status 10/18/2021 FINAL  Final  Blood Culture (routine x 2)     Status: None   Collection Time: 10/12/21 11:27 AM   Specimen: BLOOD LEFT FOREARM  Result Value Ref Range Status   Specimen Description   Final    BLOOD LEFT FOREARM BOTTLES DRAWN AEROBIC AND  ANAEROBIC   Special Requests Blood Culture adequate volume  Final   Culture   Final    NO GROWTH 6 DAYS Performed at [facility], [address] [place], [place] [address]    Report Status 10/18/2021 FINAL  Final  Culture, blood (routine x 2)     Status: Abnormal   Collection Time: 10/14/21  3:12 PM   Specimen: Left Antecubital; Blood  Result Value Ref Range Status   Specimen Description   Final    LEFT ANTECUBITAL Performed at [facility], [address], [place], [place] [address]    Special Requests   Final    BOTTLES DRAWN AEROBIC AND ANAEROBIC Blood Culture adequate volume Performed at [facility], [address], [place], [place] [address]    Culture  Setup Time   Final    GRAM POSITIVE COCCI Gram Stain Report Called to,Read Back By and Verified With: T MYERS [id number] K FORSYTH AEROBIC BOTTLE ONLY CRITICAL RESULT CALLED TO, READ BACK BY AND VERIFIED WITH: RN Bonney Leitz [phone] '@2034'$  FH    Culture (A)  Final    STAPHYLOCOCCUS EPIDERMIDIS THE SIGNIFICANCE OF ISOLATING THIS ORGANISM FROM A SINGLE SET OF BLOOD CULTURES WHEN MULTIPLE SETS ARE DRAWN IS UNCERTAIN. PLEASE NOTIFY THE MICROBIOLOGY DEPARTMENT WITHIN ONE WEEK IF SPECIATION AND SENSITIVITIES ARE REQUIRED. Performed at [facility], [place] [address], [place],  [address]    Report Status 10/17/2021 FINAL  Final  Blood Culture ID Panel (Reflexed)     Status: Abnormal   Collection Time: 10/14/21  3:12 PM  Result Value Ref Range Status   Enterococcus faecalis NOT DETECTED NOT DETECTED Final   Enterococcus Faecium NOT DETECTED NOT DETECTED Final   Listeria monocytogenes NOT DETECTED NOT DETECTED Final   Staphylococcus species DETECTED (A) NOT DETECTED Final    Comment: CRITICAL RESULT CALLED TO, READ BACK BY AND VERIFIED WITH: RN Ladell Pier [phone] '@2034'$  FH    Staphylococcus aureus (BCID) NOT DETECTED NOT DETECTED Final   Staphylococcus epidermidis DETECTED (A) NOT DETECTED Final    Comment: Methicillin  (oxacillin) resistant coagulase negative staphylococcus. Possible blood culture contaminant (unless isolated from more than one blood culture draw or clinical case suggests pathogenicity). No antibiotic treatment is indicated for blood  culture contaminants. CRITICAL RESULT CALLED TO, READ BACK BY AND VERIFIED WITH: RN Bonney Leitz [phone] '@2034'$  FH    Staphylococcus lugdunensis NOT DETECTED NOT DETECTED Final   Streptococcus species NOT DETECTED NOT DETECTED Final   Streptococcus agalactiae NOT DETECTED NOT DETECTED Final   Streptococcus pneumoniae NOT  DETECTED NOT DETECTED Final   Streptococcus pyogenes NOT DETECTED NOT DETECTED Final   A.calcoaceticus-baumannii NOT DETECTED NOT DETECTED Final   Bacteroides fragilis NOT DETECTED NOT DETECTED Final   Enterobacterales NOT DETECTED NOT DETECTED Final   Enterobacter cloacae complex NOT DETECTED NOT DETECTED Final   Escherichia coli NOT DETECTED NOT DETECTED Final   Klebsiella aerogenes NOT DETECTED NOT DETECTED Final   Klebsiella oxytoca NOT DETECTED NOT DETECTED Final   Klebsiella pneumoniae NOT DETECTED NOT DETECTED Final   Proteus species NOT DETECTED NOT DETECTED Final   Salmonella species NOT DETECTED NOT DETECTED Final   Serratia marcescens NOT DETECTED NOT DETECTED Final   Haemophilus influenzae NOT DETECTED NOT DETECTED Final   Neisseria meningitidis NOT DETECTED NOT DETECTED Final   Pseudomonas aeruginosa NOT DETECTED NOT DETECTED Final   Stenotrophomonas maltophilia NOT DETECTED NOT DETECTED Final   Candida albicans NOT DETECTED NOT DETECTED Final   Candida auris NOT DETECTED NOT DETECTED Final   Candida glabrata NOT DETECTED NOT DETECTED Final   Candida krusei NOT DETECTED NOT DETECTED Final   Candida parapsilosis NOT DETECTED NOT DETECTED Final   Candida tropicalis NOT DETECTED NOT DETECTED Final   Cryptococcus neoformans/gattii NOT DETECTED NOT DETECTED Final   Methicillin resistance mecA/C DETECTED (A) NOT DETECTED Final     Comment: CRITICAL RESULT CALLED TO, READ BACK BY AND VERIFIED WITH: RN Ladell Pier [phone] '@2034'$  FH Performed at [facility], [address] [place], [place] [address]   Aerobic/Anaerobic Culture w Gram Stain (surgical/deep wound)     Status: None (Preliminary result)   Collection Time: 10/17/21 11:57 AM   Specimen: Catheter Tip  Result Value Ref Range Status   Specimen Description   Final    CATH TIP Performed at [facility], [address], [place], [place] [address]    Special Requests   Final    Immunocompromised Performed at [facility], [address] [place], [place] [address]    Gram Stain NO WBC SEEN NO ORGANISMS SEEN   Final   Culture   Final    CULTURE REINCUBATED FOR BETTER GROWTH Performed at [facility], [place] [address] [place], [place] [address]    Report Status PENDING  Incomplete  Aerobic/Anaerobic Culture w Gram Stain (surgical/deep wound)     Status: None (Preliminary result)   Collection Time: 10/17/21 11:58 AM   Specimen: Wound  Result Value Ref Range Status   Specimen Description   Final    WOUND HEMATOMA Performed at [facility], [address] [place], [place] [address]    Special Requests   Final    Immunocompromised Performed at [facility], [address], [place], [place] [address]    Gram Stain   Final    ABUNDANT WBC PRESENT, PREDOMINANTLY PMN NO ORGANISMS SEEN    Culture   Final    CULTURE REINCUBATED FOR BETTER GROWTH Performed at [facility], [place] [address] [place], [place] [address]    Report Status PENDING  Incomplete  MRSA Next Gen by PCR, Nasal     Status: None   Collection Time: 10/17/21 12:11 PM   Specimen: Nasal Mucosa; Nasal Swab  Result Value Ref Range Status   MRSA by PCR Next Gen NOT DETECTED NOT DETECTED Final    Comment: (NOTE) The GeneXpert MRSA Assay (FDA approved for NASAL specimens only), is one component of a comprehensive MRSA colonization surveillance program. It is not intended to diagnose  MRSA infection nor to guide  or monitor treatment for MRSA infections. Test performance is not FDA approved in patients less than 68 years old. Performed at [facility], [address], [place], [place] [address]   Culture, blood (Routine X 2) w Reflex to ID Panel     Status: None (Preliminary result)   Collection Time: 10/17/21  2:33 PM   Specimen: BLOOD LEFT HAND  Result Value Ref Range Status   Specimen Description BLOOD LEFT HAND  Final   Special Requests   Final    BOTTLES DRAWN AEROBIC AND ANAEROBIC Blood Culture adequate volume   Culture   Final    NO GROWTH < 12 HOURS Performed at [facility], [address], [place], [place] [address]    Report Status PENDING  Incomplete  Culture, blood (Routine X 2) w Reflex to ID Panel     Status: None (Preliminary result)   Collection Time: 10/17/21  2:33 PM   Specimen: BLOOD RIGHT HAND  Result Value Ref Range Status   Specimen Description BLOOD RIGHT HAND  Final   Special Requests   Final    BOTTLES DRAWN AEROBIC AND ANAEROBIC Blood Culture adequate volume   Culture   Final    NO GROWTH < 12 HOURS Performed at [facility], [address], [place],  [address]    Report Status PENDING  Incomplete     Scheduled Meds:  sodium chloride  250 mL Intravenous Once   sodium chloride   Intravenous Once   acetaminophen  650 mg Oral Once   Chlorhexidine Gluconate Cloth  6 each Topical Q0600   diphenhydrAMINE  25 mg Oral Once   mouth rinse  15 mL Mouth Rinse BID   midodrine  5 mg Oral TID WC   pantoprazole (PROTONIX) IV  40 mg Intravenous Q12H   predniSONE  5 mg Oral Q breakfast   umeclidinium-vilanterol  1 puff Inhalation Daily   Continuous Infusions:  ceFEPime (MAXIPIME) IV 2 g (10/17/21 1850)   [START ON 10/19/2021] vancomycin      Procedures/Studies: CT ABDOMEN PELVIS WO CONTRAST  Result Date: 09/25/2021 CLINICAL DATA:  Weakness and anemia.  Fall on left side 09/22/2021 EXAM: CT ABDOMEN AND PELVIS WITHOUT CONTRAST TECHNIQUE:  Multidetector CT imaging of the abdomen and pelvis was performed following the standard protocol without IV contrast. RADIATION DOSE REDUCTION: This exam was performed according to the departmental dose-optimization program which includes automated exposure control, adjustment of the mA and/or kV according to patient size and/or use of iterative reconstruction technique. COMPARISON:  CT 09/23/2016 FINDINGS: Lower chest: Basilar emphysema. Patchy consolidation within the left greater than right lower lobes suspicious for pneumonia, including aspiration. Trace left pleural effusion. Hepatobiliary: There is no evidence of focal hepatic abnormality on this unenhanced exam. Gallbladder physiologically distended, no calcified stone. No biliary dilatation. Pancreas: No ductal dilatation or inflammation. Normal for age pancreatic atrophy. Spleen: The spleen measures 12.1 x 7.7 x 9.8 cm (volume = 480 cm^3), mildly enlarged. No focal abnormality or perisplenic fluid on this unenhanced exam. Adrenals/Urinary Tract: No adrenal nodule. There is no hydronephrosis. There is left renal parenchymal thinning. Multiple bilateral cysts of varying sizes in density, incompletely characterized on this unenhanced exam. These are grossly unchanged from 2018 exam, and no specific imaging follow-up is recommended. Unremarkable urinary bladder. Stomach/Bowel: There is ingested material distending the stomach. Occasional fluid-filled small bowel without obstruction or inflammation. Moderate volume of colonic stool. No colonic wall thickening or inflammatory change. Left colonic diverticulosis, prominent in the sigmoid, no diverticulitis.  Vascular/Lymphatic: Aortic atherosclerosis. No aortic aneurysm. There is no periaortic stranding. No retroperitoneal fluid. No bulky abdominopelvic adenopathy. Reproductive: Prostatic calcifications. Other: There is soft tissue edema involving the greater than right flank subcutaneous tissues dependently, as  well as midline skin thickening. Edematous changes extend over the left hip where there is also enlargement of the left gluteus muscles as described below. There is no free air or free fluid within the abdomen or pelvis. The right testis may be located in the inguinal canal. Musculoskeletal: Enlargement and heterogeneous left gluteal musculature may be related to hematoma in the setting of recent fall. There is overlying stranding of the left lateral hip and flank soft tissues. No evidence of retroperitoneal hematoma. The bones are diffusely under mineralized. Allowing for this limitation, no acute fracture of the pelvis, hip, or included ribs. There are chronic compression fractures of T11 and L2 with vertebral augmentation. Chronic compression deformity of L3, L4, and L5, not significantly changed from 06/05/2019 lumbar radiographs. IMPRESSION: 1. Enlargement and heterogeneous left gluteal musculature may be related to hematoma in the setting of recent fall. There is overlying stranding of the left lateral hip and flank soft tissues. No pelvis or hip fracture. 2. No evidence of retroperitoneal hematoma. 3. Patchy consolidation within the left greater than right lower lobes suspicious for pneumonia, including aspiration. Trace left pleural effusion. 4. Colonic diverticulosis without diverticulitis. 5. Mild splenomegaly. 6. Multiple chronic compression fractures of the lower thoracic and lumbar spine. Aortic Atherosclerosis (ICD10-I70.0) and Emphysema (ICD10-J43.9). Electronically Signed   By: Keith Rake M.D.   On: 09/25/2021 19:17   CT Head Wo Contrast  Result Date: 09/29/2021 CLINICAL DATA:  Head trauma, minor (Age >= 65y) EXAM: CT HEAD WITHOUT CONTRAST TECHNIQUE: Contiguous axial images were obtained from the base of the skull through the vertex without intravenous contrast. RADIATION DOSE REDUCTION: This exam was performed according to the departmental dose-optimization program which includes automated  exposure control, adjustment of the mA and/or kV according to patient size and/or use of iterative reconstruction technique. COMPARISON:  June 2022 FINDINGS: Brain: There is no acute intracranial hemorrhage, mass effect, or edema. Gray-white differentiation is preserved. There is no extra-axial fluid collection. Prominence of the ventricles and sulci reflects similar parenchymal volume loss. Patchy hypoattenuation in the supratentorial white matter is nonspecific but probably reflects similar chronic microvascular ischemic changes. Vascular: There is atherosclerotic calcification at the skull base. Skull: Calvarium is unremarkable. Sinuses/Orbits: No acute finding. Other: None. IMPRESSION: No evidence of acute intracranial injury. Electronically Signed   By: Macy Mis M.D.   On: 09/29/2021 12:04   CT Cervical Spine Wo Contrast  Result Date: 09/29/2021 CLINICAL DATA:  Neck trauma (Age >= 65y) EXAM: CT CERVICAL SPINE WITHOUT CONTRAST TECHNIQUE: Multidetector CT imaging of the cervical spine was performed without intravenous contrast. Multiplanar CT image reconstructions were also generated. RADIATION DOSE REDUCTION: This exam was performed according to the departmental dose-optimization program which includes automated exposure control, adjustment of the mA and/or kV according to patient size and/or use of iterative reconstruction technique. COMPARISON:  None Available. FINDINGS: Alignment: Trace retrolisthesis at C4-C5. Skull base and vertebrae: Decreased osseous mineralization. Degenerative endplate irregularity. No acute fracture. Soft tissues and spinal canal: No prevertebral fluid or swelling. No visible canal hematoma. Disc levels: Multilevel degenerative changes are present including disc space narrowing, endplate osteophytes, and facet and uncovertebral hypertrophy. Upper chest: Dictated separately. Other: Mild calcified plaque at the common carotid bifurcations. IMPRESSION: No acute cervical spine  fracture. Electronically Signed  By: Macy Mis M.D.   On: 09/29/2021 11:59   CT CHEST HIGH RESOLUTION  Result Date: 10/12/2021 CLINICAL DATA:  Pulmonary infiltrates, history of myelodysplastic syndrome EXAM: CT CHEST WITHOUT CONTRAST TECHNIQUE: Multidetector CT imaging of the chest was performed following the standard protocol without intravenous contrast. High resolution imaging of the lungs, as well as inspiratory and expiratory imaging, was performed. RADIATION DOSE REDUCTION: This exam was performed according to the departmental dose-optimization program which includes automated exposure control, adjustment of the mA and/or kV according to patient size and/or use of iterative reconstruction technique. COMPARISON:  Chest 09/29/2021 FINDINGS: Cardiovascular: Right chest port catheter. Aortic atherosclerosis. Normal heart size. Three-vessel coronary artery calcifications. No pericardial effusion. Mediastinum/Nodes: No enlarged mediastinal, hilar, or axillary lymph nodes. Thyroid gland, trachea, and esophagus demonstrate no significant findings. Lungs/Pleura: Moderate centrilobular and paraseptal emphysema. Improved irregular and heterogeneous ground-glass airspace opacity throughout the lung bases, with associated bronchiolar plugging. No significant air trapping on expiratory phase imaging. No pleural effusion or pneumothorax. Upper Abdomen: No acute abnormality. Musculoskeletal: No chest wall abnormality. No suspicious osseous lesions identified. Multiple wedge deformities throughout the thoracic spine, unchanged, status post vertebral cement augmentation of T11. IMPRESSION: 1. Improved irregular and heterogeneous ground-glass airspace opacity throughout the lung bases, with associated bronchiolar plugging. Findings are consistent with improved infection or aspiration. 2. There may be some degree of underlying scarring and or fibrosis of the lung bases, however this is difficult to assess in the setting  of superimposed acute airspace disease. There are no specific features to distinguish the possibility of fibrotic interstitial lung disease from bland scarring. 3. Emphysema. 4. Coronary artery disease. Aortic Atherosclerosis (ICD10-I70.0) and Emphysema (ICD10-J43.9). Electronically Signed   By: Delanna Ahmadi M.D.   On: 10/12/2021 12:40   CT T-SPINE NO CHARGE  Result Date: 09/29/2021 CLINICAL DATA:  Fall EXAM: CT THORACIC AND LUMBAR SPINE WITHOUT CONTRAST TECHNIQUE: Multidetector CT imaging of the thoracic and lumbar spine was performed without contrast. Multiplanar CT image reconstructions were also generated. RADIATION DOSE REDUCTION: This exam was performed according to the departmental dose-optimization program which includes automated exposure control, adjustment of the mA and/or kV according to patient size and/or use of iterative reconstruction technique. COMPARISON:  CT abdomen pelvis 09/25/2021, CT chest 02/15/2021, CT L-spine 06/02/2019 FINDINGS: CT THORACIC SPINE FINDINGS Alignment: Mild S shaped curvature. Exaggeration of the normal thoracic kyphosis in part secondary to chronic compression deformities of T6-T8). No listhesis. Vertebrae: No acute fracture or suspicious osseous lesion. Unchanged compression deformities of T6, T7, T8, T9, and T11, with redemonstrated kyphoplasty in T11. Paraspinal and other soft tissues: Please see same-day CT chest abdomen pelvis. Disc levels: No high-grade spinal canal stenosis. Moderate bilateral neural foraminal narrowing at T10-T11 and T11-T12. CT LUMBAR SPINE FINDINGS Segmentation: 5 lumbar type vertebral bodies. Partial sacralization of L5 with bilateral L5-S1 pseudoarticulation of broadened left-greater-than-right L5 transverse processes with the sacral ala. Alignment: Mild S shaped curvature of the thoracolumbar spine. Straightening and mild reversal of the normal lumbar lordosis. No significant listhesis. Vertebrae: No acute fracture or suspicious osseous  lesion. Redemonstrated compression deformities at L2, L3, L4, and L5, with redemonstrated kyphoplasty at L2. Paraspinal and other soft tissues: Please see same-day CT chest abdomen pelvis. Disc levels: Redemonstrated degenerative changes, with mild spinal canal stenosis at L2-L3 and L3-L4. No significant neural foraminal narrowing. IMPRESSION: No acute fracture or traumatic listhesis in the thoracic spine or lumbar spine. Unchanged multilevel compression deformities, status post kyphoplasty at T11 and T12. Please see same-day  CT chest abdomen pelvis for soft tissue findings. Electronically Signed   By: Merilyn Baba M.D.   On: 09/29/2021 12:09   CT L-SPINE NO CHARGE  Result Date: 09/29/2021 CLINICAL DATA:  Fall EXAM: CT THORACIC AND LUMBAR SPINE WITHOUT CONTRAST TECHNIQUE: Multidetector CT imaging of the thoracic and lumbar spine was performed without contrast. Multiplanar CT image reconstructions were also generated. RADIATION DOSE REDUCTION: This exam was performed according to the departmental dose-optimization program which includes automated exposure control, adjustment of the mA and/or kV according to patient size and/or use of iterative reconstruction technique. COMPARISON:  CT abdomen pelvis 09/25/2021, CT chest 02/15/2021, CT L-spine 06/02/2019 FINDINGS: CT THORACIC SPINE FINDINGS Alignment: Mild S shaped curvature. Exaggeration of the normal thoracic kyphosis in part secondary to chronic compression deformities of T6-T8). No listhesis. Vertebrae: No acute fracture or suspicious osseous lesion. Unchanged compression deformities of T6, T7, T8, T9, and T11, with redemonstrated kyphoplasty in T11. Paraspinal and other soft tissues: Please see same-day CT chest abdomen pelvis. Disc levels: No high-grade spinal canal stenosis. Moderate bilateral neural foraminal narrowing at T10-T11 and T11-T12. CT LUMBAR SPINE FINDINGS Segmentation: 5 lumbar type vertebral bodies. Partial sacralization of L5 with bilateral  L5-S1 pseudoarticulation of broadened left-greater-than-right L5 transverse processes with the sacral ala. Alignment: Mild S shaped curvature of the thoracolumbar spine. Straightening and mild reversal of the normal lumbar lordosis. No significant listhesis. Vertebrae: No acute fracture or suspicious osseous lesion. Redemonstrated compression deformities at L2, L3, L4, and L5, with redemonstrated kyphoplasty at L2. Paraspinal and other soft tissues: Please see same-day CT chest abdomen pelvis. Disc levels: Redemonstrated degenerative changes, with mild spinal canal stenosis at L2-L3 and L3-L4. No significant neural foraminal narrowing. IMPRESSION: No acute fracture or traumatic listhesis in the thoracic spine or lumbar spine. Unchanged multilevel compression deformities, status post kyphoplasty at T11 and T12. Please see same-day CT chest abdomen pelvis for soft tissue findings. Electronically Signed   By: Merilyn Baba M.D.   On: 09/29/2021 12:09   DG Chest Port 1 View  Result Date: 10/17/2021 CLINICAL DATA:  Removal of right IJ chest port EXAM: PORTABLE CHEST 1 VIEW COMPARISON:  Previous studies including the examination of 10/12/2021 FINDINGS: There is interval removal of right IJ chest port. There is no pleural effusion or pneumothorax. Severe emphysematous changes are noted. Increased interstitial markings are seen in both lower lung fields and right upper lung fields. There are no new infiltrates or signs of pulmonary edema. There is no significant pleural effusion or pneumothorax. There is previous vertebroplasty and compression fractures in the thoracic and upper lumbar vertebrae. IMPRESSION: COPD. Linear densities in the lower lung fields suggest scarring. There are no new infiltrates or signs of pulmonary edema. Interval removal of right IJ chest port. Electronically Signed   By: Elmer Picker M.D.   On: 10/17/2021 15:16   DG Chest Port 1 View  Result Date: 10/12/2021 CLINICAL DATA:  Short of  breath since last night. Infection at the Port-A-Cath site. History of COPD. History of bone carcinoma. EXAM: PORTABLE CHEST 1 VIEW COMPARISON:  10/02/2021 and older studies. High-resolution chest CT, 10/10/2021. FINDINGS: Cardiac silhouette is normal in size. No mediastinal or hilar masses. Right anterior chest wall, internal jugular, Port-A-Cath is stable. Lungs show diffuse irregular interstitial thickening consistent with fibrosis with underlying emphysema, stable. No lung consolidation. No convincing pulmonary edema. No pleural effusion or pneumothorax. No acute skeletal abnormality. Previous vertebral fractures treated with vertebroplasty. Skeletal structures are diffusely demineralized. IMPRESSION: 1. No acute  cardiopulmonary disease. 2. Chronic lung findings Electronically Signed   By: Lajean Manes M.D.   On: 10/12/2021 11:54   DG Chest Port 1 View  Result Date: 10/02/2021 CLINICAL DATA:  Postop port placement EXAM: PORTABLE CHEST 1 VIEW COMPARISON:  Chest radiograph 09/25/2021 FINDINGS: There is a new right chest wall port in place with tip terminating in the mid SVC. The cardiomediastinal silhouette is stable. There is extensive coarsened interstitial markings throughout both lungs. There is a new small left pleural effusion with adjacent atelectasis. There is no other new or worsening focal airspace disease. There is no significant right pleural effusion. There is no pneumothorax The bones are stable. IMPRESSION: 1. New right chest wall port in place with the tip terminating in the mid SVC. 2. New small left pleural effusion with adjacent atelectasis or pneumonia. Electronically Signed   By: Valetta Mole M.D.   On: 10/02/2021 11:44   DG Chest Port 1 View  Result Date: 09/25/2021 CLINICAL DATA:  Shortness of breath EXAM: PORTABLE CHEST 1 VIEW COMPARISON:  Chest x-ray dated Sep 20, 2021 FINDINGS: Cardiac and mediastinal contours are unchanged. Bilateral reticular opacities, most prominent in the  lung bases, similar prior exams and likely due to background emphysema and scarring. No new focal consolidation. Previously described right lung opacity is no longer visible. No large pleural effusion or pneumothorax. IMPRESSION: No new focal consolidation. Previously described right lung opacity is no longer visible, likely resolved atelectasis. Electronically Signed   By: Yetta Glassman M.D.   On: 09/25/2021 12:11   DG Chest Port 1 View  Result Date: 09/20/2021 CLINICAL DATA:  Weakness. EXAM: PORTABLE CHEST 1 VIEW COMPARISON:  08/29/2021 and prior studies FINDINGS: Cardiomediastinal silhouette is unchanged. Emphysema and chronic interstitial opacities are again identified. Focal opacity overlying the RIGHT mid lung is not significantly changed and may represent infection/pneumonia but radiographic follow-up to resolution is recommended. There is no evidence of pneumothorax or large pleural effusion. No acute bony abnormalities are identified. Vertebral augmentation changes are again noted. IMPRESSION: 1. Unchanged RIGHT mid lung opacity which may represent infection/pneumonia. Radiographic follow-up to resolution is recommended. 2. Emphysema and chronic interstitial opacities. Electronically Signed   By: Margarette Canada M.D.   On: 09/20/2021 11:48   DG C-Arm 1-60 Min-No Report  Result Date: 10/02/2021 Fluoroscopy was utilized by the requesting physician.  No radiographic interpretation.   DG HIP UNILAT WITH PELVIS 2-3 VIEWS LEFT  Result Date: 10/07/2021 CLINICAL DATA:  Pain in left hip. EXAM: DG HIP (WITH OR WITHOUT PELVIS) 2-3V LEFT COMPARISON:  09/29/2021 FINDINGS: The bones appear diffusely osteopenic. No acute fracture or dislocation. Left hip appears intact. Moderate stool burden identified within the colon. IMPRESSION: 1. No acute findings. Electronically Signed   By: Kerby Moors M.D.   On: 10/07/2021 13:55   DG Hip Unilat W or Wo Pelvis 2-3 Views Left  Result Date: 09/29/2021 CLINICAL DATA:   Fall. Tenderness along the left hip with bruising of the left posterior thigh. EXAM: DG HIP (WITH OR WITHOUT PELVIS) 2-3V LEFT COMPARISON:  CT pelvis 09/25/2021 FINDINGS: Bony demineralization. Chronic compression fractures at L4 and L5. No definite hip fracture identified. Dense prostate gland calcifications. There is irregularity of the right inferior pubic ramus suspicious for a fracture. Fracture versus accentuated trabeculation laterally in the right superior pubic ramus. IMPRESSION: 1. The left proximal femur appears intact, there is suspicion for potential fractures of the right (contralateral) inferior pubic ramus and possibly superior pubic ramus. This raises the  possibility of other fractures in the bony pelvis which might be occult radiographically partially due to the patient's bony demineralization. Special attention to the bony pelvis is recommended on the pelvis CT scan ordered for today. 2. Chronic compression fractures at L4 and L5. Electronically Signed   By: Van Clines M.D.   On: 09/29/2021 10:24   CT CHEST ABDOMEN PELVIS WO CONTRAST  Result Date: 09/29/2021 CLINICAL DATA:  Dizziness, fall, initial encounter. EXAM: CT CHEST, ABDOMEN AND PELVIS WITHOUT CONTRAST TECHNIQUE: Multidetector CT imaging of the chest, abdomen and pelvis was performed following the standard protocol without IV contrast. RADIATION DOSE REDUCTION: This exam was performed according to the departmental dose-optimization program which includes automated exposure control, adjustment of the mA and/or kV according to patient size and/or use of iterative reconstruction technique. COMPARISON:  CT abdomen pelvis 09/25/2021 and CT chest 02/15/2021. FINDINGS: CT CHEST FINDINGS Cardiovascular: Atherosclerotic calcification of the aorta, aortic valve and coronary arteries. There may be papillary muscle calcifications in the left ventricle. Decreased attenuation of the intravascular compartment is indicative of anemia. Heart  size within normal limits. No pericardial effusion. Mediastinum/Nodes: No pathologically enlarged mediastinal or axillary lymph nodes. Hilar regions are difficult to evaluate without IV contrast. Esophagus is grossly unremarkable. Lungs/Pleura: Centrilobular and paraseptal emphysema. Biapical pleuroparenchymal scarring. Dependent patchy ground-glass, septal thickening and areas of consolidation in the lower lobes, progressive from 09/25/2021. Mild patchy ground-glass in the posterior aspect of the upper lobes appears chronic. No pleural fluid. Adherent debris in the airway. Musculoskeletal: Osteopenia. Vertebral body augmentation. Old bilateral rib fractures. Numerous thoracic compression deformities with kyphosis, as on 02/15/2021. CT ABDOMEN PELVIS FINDINGS Hepatobiliary: Liver and gallbladder are unremarkable. Similar biliary ductal dilatation. Pancreas: Negative. Spleen: Negative. Adrenals/Urinary Tract: Adrenal glands are unremarkable. 5.9 cm cyst in the lower pole right kidney. Additional low and high attenuation lesions in the kidneys measure up to 1.8 cm and are likely cysts or too small to characterize. No specific follow-up necessary. Ureters are decompressed. Bladder is grossly unremarkable. Stomach/Bowel: Stomach, small bowel, appendix and colon are unremarkable. Vascular/Lymphatic: Atherosclerotic calcification of the aorta. No pathologically enlarged lymph nodes. Reproductive: Prostate is visualized and contains calcifications. Other: Mild presacral soft tissue thickening, unchanged. No free fluid. Mesenteries and peritoneum are unremarkable. Musculoskeletal: Old right inferior pubic ramus fracture. No acute fracture. Degenerative changes in the spine. L1 vertebral body augmentation. L3, L4 and L5 compression fractures as on 09/25/2021. Large left gluteal hematoma measures approximately 5.0 x 10.2 cm, new from 09/25/2021. Pre-existing inferior left gluteal hematoma (2/121), similar to minimally  enlarged. Overlying edema. IMPRESSION: 1. New and enlarging left gluteal hematomas. No underlying acute fracture. 2. Bilateral lower lobe ground-glass, septal thickening and areas of consolidation, worsening in the interval from 09/25/2021, worrisome for progressive pneumonia. 3. Aortic atherosclerosis (ICD10-I70.0). Coronary artery calcifications. 4.  Emphysema (ICD10-J43.9). Electronically Signed   By: Lorin Picket M.D.   On: 09/29/2021 12:13    Orson Eva, DO  [facility]  If 7PM-7AM, please contact night-coverage [email or web address] Password [email or web address] 10/18/2021, 3:15 PM   LOS: 1 day

## 2021-10-18 NOTE — Progress Notes (Signed)
Subjective: Patient tells me this morning he feels much better.  Tolerating diet.  No melena hematochezia.  No nausea or vomiting.  Did have drop in hemoglobin overnight.  Objective: Vital signs in last 24 hours: Temp:  [97.4 F (36.3 C)-98.3 F (36.8 C)] 97.9 F (36.6 C) (06/03 0813) Pulse Rate:  [77-94] 89 (06/03 0820) Resp:  [12-23] 21 (06/03 0820) BP: (79-115)/(34-58) 91/41 (06/03 0813) SpO2:  [90 %-100 %] 97 % (06/03 0820) Weight:  [53.1 kg-55.1 kg] 55.1 kg (06/03 0400) Last BM Date : 10/14/21 (per patient) General:   Alert and oriented, pleasant Head:  Normocephalic and atraumatic. Eyes:  No icterus, sclera clear. Conjuctiva pink.  Abdomen:  Bowel sounds present, soft, non-tender, non-distended. No HSM or hernias noted. No rebound or guarding. No masses appreciated  Msk:  Symmetrical without gross deformities. Normal posture. Extremities:  Without clubbing or edema. Neurologic:  Alert and  oriented x4;  grossly normal neurologically. Skin:  Warm and dry, intact without significant lesions.  Cervical Nodes:  No significant cervical adenopathy. Psych:  Alert and cooperative. Normal mood and affect.  Intake/Output from previous day: 06/02 0701 - 06/03 0700 In: 1412.9 [P.O.:360; I.V.:30; Blood:672.9; IV Piggyback:350] Out: 650 [Urine:650] Intake/Output this shift: Total I/O In: 443.3 [Blood:443.3] Out: -   Lab Results: Recent Labs    10/16/21 0530 10/17/21 0922 10/18/21 0038 10/18/21 0229  WBC 23.7* 38.5*  --  44.8*  HGB 7.6* 7.8* 6.3* 6.3*  HCT 24.1* 25.1* 21.0* 20.6*  PLT 38* 44*  --  62*   BMET Recent Labs    10/17/21 1433 10/18/21 0229  NA 135 135  K 4.3 3.9  CL 108 111  CO2 22 21*  GLUCOSE 102* 87  BUN 42* 42*  CREATININE 2.20* 2.19*  CALCIUM 9.1 8.3*   LFT No results for input(s): PROT, ALBUMIN, AST, ALT, ALKPHOS, BILITOT, BILIDIR, IBILI in the last 72 hours. PT/INR No results for input(s): LABPROT, INR in the last 72 hours. Hepatitis Panel No  results for input(s): HEPBSAG, HCVAB, HEPAIGM, HEPBIGM in the last 72 hours.   Studies/Results: DG Chest Port 1 View  Result Date: 10/17/2021 CLINICAL DATA:  Removal of right IJ chest port EXAM: PORTABLE CHEST 1 VIEW COMPARISON:  Previous studies including the examination of 10/12/2021 FINDINGS: There is interval removal of right IJ chest port. There is no pleural effusion or pneumothorax. Severe emphysematous changes are noted. Increased interstitial markings are seen in both lower lung fields and right upper lung fields. There are no new infiltrates or signs of pulmonary edema. There is no significant pleural effusion or pneumothorax. There is previous vertebroplasty and compression fractures in the thoracic and upper lumbar vertebrae. IMPRESSION: COPD. Linear densities in the lower lung fields suggest scarring. There are no new infiltrates or signs of pulmonary edema. Interval removal of right IJ chest port. Electronically Signed   By: Elmer Picker M.D.   On: 10/17/2021 15:16    Assessment: *Acute on chronic anemia *Esophagitis  Plan: Patient's drop in hemoglobin likely a consequence of significant postprocedure bleeding from port removal.  Discussed with RN today who states was significant amount of blood.  No evidence of overt GI bleeding.  No further emesis.  No melena hematochezia.  From a GI standpoint we will continue conservative management with PPI twice daily.  Continue monitor H&H and transfuse for less than 7.  If he has signs of recurrent overt GI bleeding we can consider endoscopic evaluation though we will continue to treat conservatively for  now.  [name], D.O. Gastroenterology and Hepatology [facility]   LOS: 1 day    10/18/2021, 8:31 AM

## 2021-10-19 DIAGNOSIS — D696 Thrombocytopenia, unspecified: Secondary | ICD-10-CM | POA: Diagnosis not present

## 2021-10-19 DIAGNOSIS — D469 Myelodysplastic syndrome, unspecified: Secondary | ICD-10-CM | POA: Diagnosis not present

## 2021-10-19 DIAGNOSIS — L089 Local infection of the skin and subcutaneous tissue, unspecified: Secondary | ICD-10-CM

## 2021-10-19 DIAGNOSIS — T148XXA Other injury of unspecified body region, initial encounter: Secondary | ICD-10-CM | POA: Diagnosis not present

## 2021-10-19 DIAGNOSIS — R6510 Systemic inflammatory response syndrome (SIRS) of non-infectious origin without acute organ dysfunction: Secondary | ICD-10-CM | POA: Diagnosis not present

## 2021-10-19 LAB — CBC
HCT: 31.2 % — ABNORMAL LOW (ref 39.0–52.0)
Hemoglobin: 10.3 g/dL — ABNORMAL LOW (ref 13.0–17.0)
MCH: 28.2 pg (ref 26.0–34.0)
MCHC: 33 g/dL (ref 30.0–36.0)
MCV: 85.5 fL (ref 80.0–100.0)
Platelets: 35 10*3/uL — ABNORMAL LOW (ref 150–400)
RBC: 3.65 MIL/uL — ABNORMAL LOW (ref 4.22–5.81)
RDW: 18.6 % — ABNORMAL HIGH (ref 11.5–15.5)
WBC: 40.4 10*3/uL — ABNORMAL HIGH (ref 4.0–10.5)
nRBC: 0.7 % — ABNORMAL HIGH (ref 0.0–0.2)

## 2021-10-19 LAB — BPAM RBC
Blood Product Expiration Date: 202306302359
Blood Product Expiration Date: 202307082359
ISSUE DATE / TIME: 202306030430
ISSUE DATE / TIME: 202306030932
Unit Type and Rh: 1700
Unit Type and Rh: 1700

## 2021-10-19 LAB — TYPE AND SCREEN
ABO/RH(D): B NEG
Antibody Screen: NEGATIVE
Unit division: 0
Unit division: 0

## 2021-10-19 NOTE — Progress Notes (Signed)
PROGRESS NOTE  [name] KGY:[id number] DOB: 1929-09-08 DOA: 10/17/2021 PCP: Daneil Dolin, MD  Brief History:  88 year old male with a history of myelodysplastic syndrome, CKD stage IIIb, COPD, GERD, chronic respiratory failure on 2 L, COPD, chronic hypotension on midodrine presented to the [facility] for routine visit today.  The patient was seen at the cancer center on 10/14/2021 secondary to hematoma and possible cellulitis around his right IJ Port-A-Cath site.  Blood cultures were drawn on that day and are currently growing methicillin-resistant Staphylococcus epidermidis.  Blood cultures on 10/12/2021 were negative.  The patient was sent back to the [facility] with cephalexin for 7-day course.  He returned to the clinic today with increasing generalized weakness, and nausea and vomiting of 1 day duration.  The patient feels that he was vomiting some blackish type material on 10/16/2021.  He denies any fevers, chills, chest pain, shortness breath, abdominal pain, dysuria, hematuria.  Notably, the patient recently had his current Port-A-Cath placed on 10/02/2021.  His previous Port-A-Cath was removed on 08/07/2021 due to hematoma and possible infection.  He was treated with Augmentin at that time. The patient noted that he has had progressive pain and swelling about his right-sided Port-A-Cath since 10/10/2021.  When comparing pictures from his previous visit on 10/14/2021, it appears that the patient's edema and hematoma may have progressed. Notably, the patient has had numerous hospital admissions recently.  Patient was recently admitted to the hospital from 5/11 to 5/13, after he suffered a fall and developed a significant gluteal hematoma resulting in blood loss anemia.  He has been transfused multiple units of PRBCs as well as platelets to keep hemoglobin greater than 8 and platelets greater than 50,000.  He was subsequently discharged to [facility] for  rehab. In addition, he had an admission from 09/20/2021 to 09/21/2021 for symptomatic anemia.  He was transfused 2 units PRBC.Marland Kitchen  He was admitted from 08/09/2021 to 08/11/2021 secondary to bleeding from his left-sided Port-A-Cath removal site.  He was transfused 2 units PRBC at that time.  He also received 1 unit platelets.   Assessment/Plan: Infected Right IJ Port-A-Cath hematoma -cultures = MRSE -General surgery consulted; discussed with Dr. Constance Haw --Port-A-Cath removal---done on 10/17/21 -Repeat blood cultures x2 sets--neg to date -follow up cultures from removed port-a-cath = staph epi -continue empiric vanc and cefepime  MRSE Bacteremia 10/14/21 blood culture = MRSE --called microbiology to work up sensitivity of 5/30 blood culture --source = port-a-cath (removed 10/17/21) Follow 10/17/21 blood culture   Coffee-ground emesis -Start pantoprazole IV twice daily>>continue 3/8 EGD--Localized hemorrhagic mucosa with active bleeding and with stigmata of bleeding was found in the proximal jejunum. Upon lavage it looked as a vascularized area, ?possible Dieulafoy vs AVM.  Hemoclip placed -Consult GI appreciated>>continue PPI bid, treat conservatively   Acute on chronic anemia Baseline hemoglobin with wide variation, but usually ~8 range -Transfused 2 units PRBC 10/18/21   Myelodysplastic syndrome (HCC) Follows Dr. Delton Coombes -follow up with Dr. Delton Coombes states treatment of MDS on hold due to hospitalizations for syncope Previously on Azacitidine 09/16/20 to 04/21/21 --WBC continues to climb>>add diff to CBC --concerned about leukemic conversion --CBC with diff in am   Thrombocytopenia ([place]) Has been chronic due to MDS,  30-40K range   CKD (chronic kidney disease), stage IIIB Baseline creatinine 1.5-1.8 Serial BMPs   COPD (chronic obstructive pulmonary disease) (HCC) Baseline 3 L oxygen requirement Continue albuterol as needed  Stable   Chronic hypotension -Continue on midodrine   Fall  with left gluteal hematoma -currently at [facility] for rehab Left hip hematoma appears to be involuting       Family Communication:   daughter updated at bedside 6/3   Consultants:  GI   Code Status:  DNR   DVT Prophylaxis:  SCDs     Procedures: As Listed in Progress Note Above   Antibiotics: Vanc 6/2>> Cefepime 6/2>>6/4            Subjective: Patient denies fevers, chills, headache, chest pain, dyspnea, nausea, vomiting, diarrhea, abdominal pain, dysuria, hematuria, hematochezia, and melena.   Objective: Vitals:   10/19/21 0211 10/19/21 0500 10/19/21 0709 10/19/21 1335  BP: (!) 101/49 (!) 98/51  (!) 103/53  Pulse: (!) 53 73  84  Resp: '18 20  19  '$ Temp: 97.7 F (36.5 C) 98.2 F (36.8 C)  98.8 F (37.1 C)  TempSrc: Oral Oral  Oral  SpO2: 90% 91% 94% 90%  Weight:      Height:        Intake/Output Summary (Last 24 hours) at 10/19/2021 1435 Last data filed at 10/19/2021 0900 Gross per 24 hour  Intake 230.5 ml  Output 1275 ml  Net -1044.5 ml   Weight change:  Exam:  General:  Pt is alert, follows commands appropriately, not in acute distress HEENT: No icterus, No thrush, No neck mass, [place]/AT Cardiovascular: fine bibasilar crackles. No wheeze Abdomen: Soft/+BS, non tender, non distended, no guarding Extremities: No edema, No lymphangitis, No petechiae, No rashes, no synovitis   Data Reviewed: I have personally reviewed following labs and imaging studies Basic Metabolic Panel: Recent Labs  Lab 10/17/21 1433 10/18/21 0229  NA 135 135  K 4.3 3.9  CL 108 111  CO2 22 21*  GLUCOSE 102* 87  BUN 42* 42*  CREATININE 2.20* 2.19*  CALCIUM 9.1 8.3*  MG  --  1.8   Liver Function Tests: No results for input(s): AST, ALT, ALKPHOS, BILITOT, PROT, ALBUMIN in the last 168 hours. No results for input(s): LIPASE, AMYLASE in the last 168 hours. No results for input(s): AMMONIA in the last 168 hours. Coagulation Profile: No results for input(s): INR,  PROTIME in the last 168 hours. CBC: Recent Labs  Lab 10/14/21 1040 10/16/21 0530 10/17/21 0922 10/18/21 0038 10/18/21 0229 10/18/21 1410  WBC 25.4* 23.7* 38.5*  --  44.8*  --   NEUTROABS  --  10.5*  --   --   --   --   HGB 8.7* 7.6* 7.8* 6.3* 6.3* 10.0*  HCT 27.5* 24.1* 25.1* 21.0* 20.6* 31.6*  MCV 83.1 83.4 84.5  --  84.4  --   PLT 44* 38* 44*  --  62*  --    Cardiac Enzymes: No results for input(s): CKTOTAL, CKMB, CKMBINDEX, TROPONINI in the last 168 hours. BNP: Invalid input(s): POCBNP CBG: No results for input(s): GLUCAP in the last 168 hours. HbA1C: No results for input(s): HGBA1C in the last 72 hours. Urine analysis:    Component Value Date/Time   COLORURINE YELLOW 10/17/2021 1351   APPEARANCEUR HAZY (A) 10/17/2021 1351   APPEARANCEUR Hazy (A) 03/26/2021 1334   LABSPEC 1.015 10/17/2021 1351   PHURINE 5.0 10/17/2021 1351   GLUCOSEU NEGATIVE 10/17/2021 1351   HGBUR MODERATE (A) 10/17/2021 1351   BILIRUBINUR NEGATIVE 10/17/2021 1351   BILIRUBINUR Negative 03/26/2021 1334   KETONESUR NEGATIVE 10/17/2021 1351   PROTEINUR 100 (A) 10/17/2021 1351   UROBILINOGEN 0.2  08/15/2013 1240   NITRITE NEGATIVE 10/17/2021 1351   LEUKOCYTESUR NEGATIVE 10/17/2021 1351   Sepsis Labs: '@LABRCNTIP'$ (procalcitonin:4,lacticidven:4) ) Recent Results (from the past 240 hour(s))  Blood Culture (routine x 2)     Status: None   Collection Time: 10/12/21 11:19 AM   Specimen: BLOOD RIGHT FOREARM  Result Value Ref Range Status   Specimen Description   Final    BLOOD RIGHT FOREARM BOTTLES DRAWN AEROBIC AND ANAEROBIC   Special Requests Blood Culture adequate volume  Final   Culture   Final    NO GROWTH 6 DAYS Performed at [facility], [address] [place], [place] [address]    Report Status 10/18/2021 FINAL  Final  Blood Culture (routine x 2)     Status: None   Collection Time: 10/12/21 11:27 AM   Specimen: BLOOD LEFT FOREARM  Result Value Ref Range Status   Specimen Description    Final    BLOOD LEFT FOREARM BOTTLES DRAWN AEROBIC AND ANAEROBIC   Special Requests Blood Culture adequate volume  Final   Culture   Final    NO GROWTH 6 DAYS Performed at [facility], [address] [place], [place] [address]    Report Status 10/18/2021 FINAL  Final  Culture, blood (routine x 2)     Status: Abnormal   Collection Time: 10/14/21  3:12 PM   Specimen: Left Antecubital; Blood  Result Value Ref Range Status   Specimen Description   Final    LEFT ANTECUBITAL Performed at [facility], [address], [place], [place] [address]    Special Requests   Final    BOTTLES DRAWN AEROBIC AND ANAEROBIC Blood Culture adequate volume Performed at [facility], [address] [place], [place] [address]    Culture  Setup Time   Final    GRAM POSITIVE COCCI Gram Stain Report Called to,Read Back By and Verified With: T MYERS [id number] K FORSYTH AEROBIC BOTTLE ONLY CRITICAL RESULT CALLED TO, READ BACK BY AND VERIFIED WITH: RN Bonney Leitz [phone] '@2034'$  FH    Culture (A)  Final    STAPHYLOCOCCUS EPIDERMIDIS THE SIGNIFICANCE OF ISOLATING THIS ORGANISM FROM A SINGLE SET OF BLOOD CULTURES WHEN MULTIPLE SETS ARE DRAWN IS UNCERTAIN. PLEASE NOTIFY THE MICROBIOLOGY DEPARTMENT WITHIN ONE WEEK IF SPECIATION AND SENSITIVITIES ARE REQUIRED. Performed at [facility], [place] [address] [place], [place] [address]    Report Status 10/17/2021 FINAL  Final  Blood Culture ID Panel (Reflexed)     Status: Abnormal   Collection Time: 10/14/21  3:12 PM  Result Value Ref Range Status   Enterococcus faecalis NOT DETECTED NOT DETECTED Final   Enterococcus Faecium NOT DETECTED NOT DETECTED Final   Listeria monocytogenes NOT DETECTED NOT DETECTED Final   Staphylococcus species DETECTED (A) NOT DETECTED Final    Comment: CRITICAL RESULT CALLED TO, READ BACK BY AND VERIFIED WITH: RN Ladell Pier [phone] '@2034'$  FH    Staphylococcus aureus (BCID) NOT DETECTED NOT DETECTED Final   Staphylococcus epidermidis DETECTED  (A) NOT DETECTED Final    Comment: Methicillin (oxacillin) resistant coagulase negative staphylococcus. Possible blood culture contaminant (unless isolated from more than one blood culture draw or clinical case suggests pathogenicity). No antibiotic treatment is indicated for blood  culture contaminants. CRITICAL RESULT CALLED TO, READ BACK BY AND VERIFIED WITH: RN Bonney Leitz [phone] '@2034'$  FH    Staphylococcus lugdunensis NOT DETECTED NOT DETECTED Final   Streptococcus species NOT DETECTED NOT DETECTED Final   Streptococcus agalactiae NOT DETECTED NOT  DETECTED Final   Streptococcus pneumoniae NOT DETECTED NOT DETECTED Final   Streptococcus pyogenes NOT DETECTED NOT DETECTED Final   A.calcoaceticus-baumannii NOT DETECTED NOT DETECTED Final   Bacteroides fragilis NOT DETECTED NOT DETECTED Final   Enterobacterales NOT DETECTED NOT DETECTED Final   Enterobacter cloacae complex NOT DETECTED NOT DETECTED Final   Escherichia coli NOT DETECTED NOT DETECTED Final   Klebsiella aerogenes NOT DETECTED NOT DETECTED Final   Klebsiella oxytoca NOT DETECTED NOT DETECTED Final   Klebsiella pneumoniae NOT DETECTED NOT DETECTED Final   Proteus species NOT DETECTED NOT DETECTED Final   Salmonella species NOT DETECTED NOT DETECTED Final   Serratia marcescens NOT DETECTED NOT DETECTED Final   Haemophilus influenzae NOT DETECTED NOT DETECTED Final   Neisseria meningitidis NOT DETECTED NOT DETECTED Final   Pseudomonas aeruginosa NOT DETECTED NOT DETECTED Final   Stenotrophomonas maltophilia NOT DETECTED NOT DETECTED Final   Candida albicans NOT DETECTED NOT DETECTED Final   Candida auris NOT DETECTED NOT DETECTED Final   Candida glabrata NOT DETECTED NOT DETECTED Final   Candida krusei NOT DETECTED NOT DETECTED Final   Candida parapsilosis NOT DETECTED NOT DETECTED Final   Candida tropicalis NOT DETECTED NOT DETECTED Final   Cryptococcus neoformans/gattii NOT DETECTED NOT DETECTED Final   Methicillin  resistance mecA/C DETECTED (A) NOT DETECTED Final    Comment: CRITICAL RESULT CALLED TO, READ BACK BY AND VERIFIED WITH: RN Ladell Pier [phone] '@2034'$  FH Performed at [facility], [address] [place], [place] [address]   Aerobic/Anaerobic Culture w Gram Stain (surgical/deep wound)     Status: None (Preliminary result)   Collection Time: 10/17/21 11:57 AM   Specimen: Catheter Tip  Result Value Ref Range Status   Specimen Description   Final    CATH TIP Performed at [facility], [address] [place], [place] [address]    Special Requests   Final    Immunocompromised Performed at [facility], [address], [place], [place] [address]    Gram Stain NO WBC SEEN NO ORGANISMS SEEN   Final   Culture   Final    RARE STAPHYLOCOCCUS EPIDERMIDIS NO ANAEROBES ISOLATED; CULTURE IN PROGRESS FOR 5 DAYS SUSCEPTIBILITIES TO FOLLOW Performed at [facility], [place] [address] [place], [place] [address]    Report Status PENDING  Incomplete  Aerobic/Anaerobic Culture w Gram Stain (surgical/deep wound)     Status: None (Preliminary result)   Collection Time: 10/17/21 11:58 AM   Specimen: Wound  Result Value Ref Range Status   Specimen Description   Final    WOUND HEMATOMA Performed at [facility] [address], [place], [place] [address]    Special Requests   Final    Immunocompromised Performed at [facility], [address], [place], [place] [address]    Gram Stain   Final    ABUNDANT WBC PRESENT, PREDOMINANTLY PMN NO ORGANISMS SEEN    Culture   Final    FEW STAPHYLOCOCCUS EPIDERMIDIS NO ANAEROBES ISOLATED; CULTURE IN PROGRESS FOR 5 DAYS SUSCEPTIBILITIES TO FOLLOW Performed at [facility], [address], [place], [place] [address]    Report Status PENDING  Incomplete  MRSA Next Gen by PCR, Nasal     Status: None   Collection Time: 10/17/21 12:11 PM   Specimen: Nasal Mucosa; Nasal Swab  Result Value Ref Range Status   MRSA by PCR Next Gen NOT DETECTED NOT  DETECTED Final    Comment: (NOTE) The GeneXpert MRSA Assay (  FDA approved for NASAL specimens only), is one component of a comprehensive MRSA colonization surveillance program. It is not intended to diagnose MRSA infection nor to guide or monitor treatment for MRSA infections. Test performance is not FDA approved in patients less than 55 years old. Performed at [facility], [address] [place], [place] [address]   Culture, blood (Routine X 2) w Reflex to ID Panel     Status: None (Preliminary result)   Collection Time: 10/17/21  2:33 PM   Specimen: BLOOD LEFT HAND  Result Value Ref Range Status   Specimen Description BLOOD LEFT HAND  Final   Special Requests   Final    BOTTLES DRAWN AEROBIC AND ANAEROBIC Blood Culture adequate volume   Culture   Final    NO GROWTH 2 DAYS Performed at [facility], [address], [place], [place] [address]    Report Status PENDING  Incomplete  Culture, blood (Routine X 2) w Reflex to ID Panel     Status: None (Preliminary result)   Collection Time: 10/17/21  2:33 PM   Specimen: BLOOD RIGHT HAND  Result Value Ref Range Status   Specimen Description BLOOD RIGHT HAND  Final   Special Requests   Final    BOTTLES DRAWN AEROBIC AND ANAEROBIC Blood Culture adequate volume   Culture   Final    NO GROWTH 2 DAYS Performed at [facility], [address] [place], [place] [address]    Report Status PENDING  Incomplete     Scheduled Meds:  sodium chloride  250 mL Intravenous Once   sodium chloride   Intravenous Once   acetaminophen  650 mg Oral Once   Chlorhexidine Gluconate Cloth  6 each Topical Q0600   diphenhydrAMINE  25 mg Oral Once   mouth rinse  15 mL Mouth Rinse BID   midodrine  5 mg Oral TID WC   pantoprazole (PROTONIX) IV  40 mg Intravenous Q12H   predniSONE  5 mg Oral Q breakfast   umeclidinium-vilanterol  1 puff Inhalation Daily   Continuous Infusions:  vancomycin      Procedures/Studies: CT ABDOMEN PELVIS WO CONTRAST  Result  Date: 09/25/2021 CLINICAL DATA:  Weakness and anemia.  Fall on left side 09/22/2021 EXAM: CT ABDOMEN AND PELVIS WITHOUT CONTRAST TECHNIQUE: Multidetector CT imaging of the abdomen and pelvis was performed following the standard protocol without IV contrast. RADIATION DOSE REDUCTION: This exam was performed according to the departmental dose-optimization program which includes automated exposure control, adjustment of the mA and/or kV according to patient size and/or use of iterative reconstruction technique. COMPARISON:  CT 09/23/2016 FINDINGS: Lower chest: Basilar emphysema. Patchy consolidation within the left greater than right lower lobes suspicious for pneumonia, including aspiration. Trace left pleural effusion. Hepatobiliary: There is no evidence of focal hepatic abnormality on this unenhanced exam. Gallbladder physiologically distended, no calcified stone. No biliary dilatation. Pancreas: No ductal dilatation or inflammation. Normal for age pancreatic atrophy. Spleen: The spleen measures 12.1 x 7.7 x 9.8 cm (volume = 480 cm^3), mildly enlarged. No focal abnormality or perisplenic fluid on this unenhanced exam. Adrenals/Urinary Tract: No adrenal nodule. There is no hydronephrosis. There is left renal parenchymal thinning. Multiple bilateral cysts of varying sizes in density, incompletely characterized on this unenhanced exam. These are grossly unchanged from 2018 exam, and no specific imaging follow-up is recommended. Unremarkable urinary bladder. Stomach/Bowel: There is ingested material distending the stomach. Occasional fluid-filled small bowel without obstruction or inflammation. Moderate volume of colonic stool. No colonic wall  thickening or inflammatory change. Left colonic diverticulosis, prominent in the sigmoid, no diverticulitis. Vascular/Lymphatic: Aortic atherosclerosis. No aortic aneurysm. There is no periaortic stranding. No retroperitoneal fluid. No bulky abdominopelvic adenopathy.  Reproductive: Prostatic calcifications. Other: There is soft tissue edema involving the greater than right flank subcutaneous tissues dependently, as well as midline skin thickening. Edematous changes extend over the left hip where there is also enlargement of the left gluteus muscles as described below. There is no free air or free fluid within the abdomen or pelvis. The right testis may be located in the inguinal canal. Musculoskeletal: Enlargement and heterogeneous left gluteal musculature may be related to hematoma in the setting of recent fall. There is overlying stranding of the left lateral hip and flank soft tissues. No evidence of retroperitoneal hematoma. The bones are diffusely under mineralized. Allowing for this limitation, no acute fracture of the pelvis, hip, or included ribs. There are chronic compression fractures of T11 and L2 with vertebral augmentation. Chronic compression deformity of L3, L4, and L5, not significantly changed from 06/05/2019 lumbar radiographs. IMPRESSION: 1. Enlargement and heterogeneous left gluteal musculature may be related to hematoma in the setting of recent fall. There is overlying stranding of the left lateral hip and flank soft tissues. No pelvis or hip fracture. 2. No evidence of retroperitoneal hematoma. 3. Patchy consolidation within the left greater than right lower lobes suspicious for pneumonia, including aspiration. Trace left pleural effusion. 4. Colonic diverticulosis without diverticulitis. 5. Mild splenomegaly. 6. Multiple chronic compression fractures of the lower thoracic and lumbar spine. Aortic Atherosclerosis (ICD10-I70.0) and Emphysema (ICD10-J43.9). Electronically Signed   By: Keith Rake M.D.   On: 09/25/2021 19:17   CT Head Wo Contrast  Result Date: 09/29/2021 CLINICAL DATA:  Head trauma, minor (Age >= 65y) EXAM: CT HEAD WITHOUT CONTRAST TECHNIQUE: Contiguous axial images were obtained from the base of the skull through the vertex without  intravenous contrast. RADIATION DOSE REDUCTION: This exam was performed according to the departmental dose-optimization program which includes automated exposure control, adjustment of the mA and/or kV according to patient size and/or use of iterative reconstruction technique. COMPARISON:  June 2022 FINDINGS: Brain: There is no acute intracranial hemorrhage, mass effect, or edema. Gray-white differentiation is preserved. There is no extra-axial fluid collection. Prominence of the ventricles and sulci reflects similar parenchymal volume loss. Patchy hypoattenuation in the supratentorial white matter is nonspecific but probably reflects similar chronic microvascular ischemic changes. Vascular: There is atherosclerotic calcification at the skull base. Skull: Calvarium is unremarkable. Sinuses/Orbits: No acute finding. Other: None. IMPRESSION: No evidence of acute intracranial injury. Electronically Signed   By: Macy Mis M.D.   On: 09/29/2021 12:04   CT Cervical Spine Wo Contrast  Result Date: 09/29/2021 CLINICAL DATA:  Neck trauma (Age >= 65y) EXAM: CT CERVICAL SPINE WITHOUT CONTRAST TECHNIQUE: Multidetector CT imaging of the cervical spine was performed without intravenous contrast. Multiplanar CT image reconstructions were also generated. RADIATION DOSE REDUCTION: This exam was performed according to the departmental dose-optimization program which includes automated exposure control, adjustment of the mA and/or kV according to patient size and/or use of iterative reconstruction technique. COMPARISON:  None Available. FINDINGS: Alignment: Trace retrolisthesis at C4-C5. Skull base and vertebrae: Decreased osseous mineralization. Degenerative endplate irregularity. No acute fracture. Soft tissues and spinal canal: No prevertebral fluid or swelling. No visible canal hematoma. Disc levels: Multilevel degenerative changes are present including disc space narrowing, endplate osteophytes, and facet and  uncovertebral hypertrophy. Upper chest: Dictated separately. Other: Mild calcified plaque at  the common carotid bifurcations. IMPRESSION: No acute cervical spine fracture. Electronically Signed   By: Macy Mis M.D.   On: 09/29/2021 11:59   CT CHEST HIGH RESOLUTION  Result Date: 10/12/2021 CLINICAL DATA:  Pulmonary infiltrates, history of myelodysplastic syndrome EXAM: CT CHEST WITHOUT CONTRAST TECHNIQUE: Multidetector CT imaging of the chest was performed following the standard protocol without intravenous contrast. High resolution imaging of the lungs, as well as inspiratory and expiratory imaging, was performed. RADIATION DOSE REDUCTION: This exam was performed according to the departmental dose-optimization program which includes automated exposure control, adjustment of the mA and/or kV according to patient size and/or use of iterative reconstruction technique. COMPARISON:  Chest 09/29/2021 FINDINGS: Cardiovascular: Right chest port catheter. Aortic atherosclerosis. Normal heart size. Three-vessel coronary artery calcifications. No pericardial effusion. Mediastinum/Nodes: No enlarged mediastinal, hilar, or axillary lymph nodes. Thyroid gland, trachea, and esophagus demonstrate no significant findings. Lungs/Pleura: Moderate centrilobular and paraseptal emphysema. Improved irregular and heterogeneous ground-glass airspace opacity throughout the lung bases, with associated bronchiolar plugging. No significant air trapping on expiratory phase imaging. No pleural effusion or pneumothorax. Upper Abdomen: No acute abnormality. Musculoskeletal: No chest wall abnormality. No suspicious osseous lesions identified. Multiple wedge deformities throughout the thoracic spine, unchanged, status post vertebral cement augmentation of T11. IMPRESSION: 1. Improved irregular and heterogeneous ground-glass airspace opacity throughout the lung bases, with associated bronchiolar plugging. Findings are consistent with improved  infection or aspiration. 2. There may be some degree of underlying scarring and or fibrosis of the lung bases, however this is difficult to assess in the setting of superimposed acute airspace disease. There are no specific features to distinguish the possibility of fibrotic interstitial lung disease from bland scarring. 3. Emphysema. 4. Coronary artery disease. Aortic Atherosclerosis (ICD10-I70.0) and Emphysema (ICD10-J43.9). Electronically Signed   By: Delanna Ahmadi M.D.   On: 10/12/2021 12:40   CT T-SPINE NO CHARGE  Result Date: 09/29/2021 CLINICAL DATA:  Fall EXAM: CT THORACIC AND LUMBAR SPINE WITHOUT CONTRAST TECHNIQUE: Multidetector CT imaging of the thoracic and lumbar spine was performed without contrast. Multiplanar CT image reconstructions were also generated. RADIATION DOSE REDUCTION: This exam was performed according to the departmental dose-optimization program which includes automated exposure control, adjustment of the mA and/or kV according to patient size and/or use of iterative reconstruction technique. COMPARISON:  CT abdomen pelvis 09/25/2021, CT chest 02/15/2021, CT L-spine 06/02/2019 FINDINGS: CT THORACIC SPINE FINDINGS Alignment: Mild S shaped curvature. Exaggeration of the normal thoracic kyphosis in part secondary to chronic compression deformities of T6-T8). No listhesis. Vertebrae: No acute fracture or suspicious osseous lesion. Unchanged compression deformities of T6, T7, T8, T9, and T11, with redemonstrated kyphoplasty in T11. Paraspinal and other soft tissues: Please see same-day CT chest abdomen pelvis. Disc levels: No high-grade spinal canal stenosis. Moderate bilateral neural foraminal narrowing at T10-T11 and T11-T12. CT LUMBAR SPINE FINDINGS Segmentation: 5 lumbar type vertebral bodies. Partial sacralization of L5 with bilateral L5-S1 pseudoarticulation of broadened left-greater-than-right L5 transverse processes with the sacral ala. Alignment: Mild S shaped curvature of the  thoracolumbar spine. Straightening and mild reversal of the normal lumbar lordosis. No significant listhesis. Vertebrae: No acute fracture or suspicious osseous lesion. Redemonstrated compression deformities at L2, L3, L4, and L5, with redemonstrated kyphoplasty at L2. Paraspinal and other soft tissues: Please see same-day CT chest abdomen pelvis. Disc levels: Redemonstrated degenerative changes, with mild spinal canal stenosis at L2-L3 and L3-L4. No significant neural foraminal narrowing. IMPRESSION: No acute fracture or traumatic listhesis in the thoracic spine or lumbar spine.  Unchanged multilevel compression deformities, status post kyphoplasty at T11 and T12. Please see same-day CT chest abdomen pelvis for soft tissue findings. Electronically Signed   By: Merilyn Baba M.D.   On: 09/29/2021 12:09   CT L-SPINE NO CHARGE  Result Date: 09/29/2021 CLINICAL DATA:  Fall EXAM: CT THORACIC AND LUMBAR SPINE WITHOUT CONTRAST TECHNIQUE: Multidetector CT imaging of the thoracic and lumbar spine was performed without contrast. Multiplanar CT image reconstructions were also generated. RADIATION DOSE REDUCTION: This exam was performed according to the departmental dose-optimization program which includes automated exposure control, adjustment of the mA and/or kV according to patient size and/or use of iterative reconstruction technique. COMPARISON:  CT abdomen pelvis 09/25/2021, CT chest 02/15/2021, CT L-spine 06/02/2019 FINDINGS: CT THORACIC SPINE FINDINGS Alignment: Mild S shaped curvature. Exaggeration of the normal thoracic kyphosis in part secondary to chronic compression deformities of T6-T8). No listhesis. Vertebrae: No acute fracture or suspicious osseous lesion. Unchanged compression deformities of T6, T7, T8, T9, and T11, with redemonstrated kyphoplasty in T11. Paraspinal and other soft tissues: Please see same-day CT chest abdomen pelvis. Disc levels: No high-grade spinal canal stenosis. Moderate bilateral  neural foraminal narrowing at T10-T11 and T11-T12. CT LUMBAR SPINE FINDINGS Segmentation: 5 lumbar type vertebral bodies. Partial sacralization of L5 with bilateral L5-S1 pseudoarticulation of broadened left-greater-than-right L5 transverse processes with the sacral ala. Alignment: Mild S shaped curvature of the thoracolumbar spine. Straightening and mild reversal of the normal lumbar lordosis. No significant listhesis. Vertebrae: No acute fracture or suspicious osseous lesion. Redemonstrated compression deformities at L2, L3, L4, and L5, with redemonstrated kyphoplasty at L2. Paraspinal and other soft tissues: Please see same-day CT chest abdomen pelvis. Disc levels: Redemonstrated degenerative changes, with mild spinal canal stenosis at L2-L3 and L3-L4. No significant neural foraminal narrowing. IMPRESSION: No acute fracture or traumatic listhesis in the thoracic spine or lumbar spine. Unchanged multilevel compression deformities, status post kyphoplasty at T11 and T12. Please see same-day CT chest abdomen pelvis for soft tissue findings. Electronically Signed   By: Merilyn Baba M.D.   On: 09/29/2021 12:09   DG Chest Port 1 View  Result Date: 10/17/2021 CLINICAL DATA:  Removal of right IJ chest port EXAM: PORTABLE CHEST 1 VIEW COMPARISON:  Previous studies including the examination of 10/12/2021 FINDINGS: There is interval removal of right IJ chest port. There is no pleural effusion or pneumothorax. Severe emphysematous changes are noted. Increased interstitial markings are seen in both lower lung fields and right upper lung fields. There are no new infiltrates or signs of pulmonary edema. There is no significant pleural effusion or pneumothorax. There is previous vertebroplasty and compression fractures in the thoracic and upper lumbar vertebrae. IMPRESSION: COPD. Linear densities in the lower lung fields suggest scarring. There are no new infiltrates or signs of pulmonary edema. Interval removal of right IJ  chest port. Electronically Signed   By: Elmer Picker M.D.   On: 10/17/2021 15:16   DG Chest Port 1 View  Result Date: 10/12/2021 CLINICAL DATA:  Short of breath since last night. Infection at the Port-A-Cath site. History of COPD. History of bone carcinoma. EXAM: PORTABLE CHEST 1 VIEW COMPARISON:  10/02/2021 and older studies. High-resolution chest CT, 10/10/2021. FINDINGS: Cardiac silhouette is normal in size. No mediastinal or hilar masses. Right anterior chest wall, internal jugular, Port-A-Cath is stable. Lungs show diffuse irregular interstitial thickening consistent with fibrosis with underlying emphysema, stable. No lung consolidation. No convincing pulmonary edema. No pleural effusion or pneumothorax. No acute skeletal abnormality. Previous  vertebral fractures treated with vertebroplasty. Skeletal structures are diffusely demineralized. IMPRESSION: 1. No acute cardiopulmonary disease. 2. Chronic lung findings Electronically Signed   By: Lajean Manes M.D.   On: 10/12/2021 11:54   DG Chest Port 1 View  Result Date: 10/02/2021 CLINICAL DATA:  Postop port placement EXAM: PORTABLE CHEST 1 VIEW COMPARISON:  Chest radiograph 09/25/2021 FINDINGS: There is a new right chest wall port in place with tip terminating in the mid SVC. The cardiomediastinal silhouette is stable. There is extensive coarsened interstitial markings throughout both lungs. There is a new small left pleural effusion with adjacent atelectasis. There is no other new or worsening focal airspace disease. There is no significant right pleural effusion. There is no pneumothorax The bones are stable. IMPRESSION: 1. New right chest wall port in place with the tip terminating in the mid SVC. 2. New small left pleural effusion with adjacent atelectasis or pneumonia. Electronically Signed   By: Valetta Mole M.D.   On: 10/02/2021 11:44   DG Chest Port 1 View  Result Date: 09/25/2021 CLINICAL DATA:  Shortness of breath EXAM: PORTABLE CHEST  1 VIEW COMPARISON:  Chest x-ray dated Sep 20, 2021 FINDINGS: Cardiac and mediastinal contours are unchanged. Bilateral reticular opacities, most prominent in the lung bases, similar prior exams and likely due to background emphysema and scarring. No new focal consolidation. Previously described right lung opacity is no longer visible. No large pleural effusion or pneumothorax. IMPRESSION: No new focal consolidation. Previously described right lung opacity is no longer visible, likely resolved atelectasis. Electronically Signed   By: Yetta Glassman M.D.   On: 09/25/2021 12:11   DG Chest Port 1 View  Result Date: 09/20/2021 CLINICAL DATA:  Weakness. EXAM: PORTABLE CHEST 1 VIEW COMPARISON:  08/29/2021 and prior studies FINDINGS: Cardiomediastinal silhouette is unchanged. Emphysema and chronic interstitial opacities are again identified. Focal opacity overlying the RIGHT mid lung is not significantly changed and may represent infection/pneumonia but radiographic follow-up to resolution is recommended. There is no evidence of pneumothorax or large pleural effusion. No acute bony abnormalities are identified. Vertebral augmentation changes are again noted. IMPRESSION: 1. Unchanged RIGHT mid lung opacity which may represent infection/pneumonia. Radiographic follow-up to resolution is recommended. 2. Emphysema and chronic interstitial opacities. Electronically Signed   By: Margarette Canada M.D.   On: 09/20/2021 11:48   DG C-Arm 1-60 Min-No Report  Result Date: 10/02/2021 Fluoroscopy was utilized by the requesting physician.  No radiographic interpretation.   DG HIP UNILAT WITH PELVIS 2-3 VIEWS LEFT  Result Date: 10/07/2021 CLINICAL DATA:  Pain in left hip. EXAM: DG HIP (WITH OR WITHOUT PELVIS) 2-3V LEFT COMPARISON:  09/29/2021 FINDINGS: The bones appear diffusely osteopenic. No acute fracture or dislocation. Left hip appears intact. Moderate stool burden identified within the colon. IMPRESSION: 1. No acute findings.  Electronically Signed   By: Kerby Moors M.D.   On: 10/07/2021 13:55   DG Hip Unilat W or Wo Pelvis 2-3 Views Left  Result Date: 09/29/2021 CLINICAL DATA:  Fall. Tenderness along the left hip with bruising of the left posterior thigh. EXAM: DG HIP (WITH OR WITHOUT PELVIS) 2-3V LEFT COMPARISON:  CT pelvis 09/25/2021 FINDINGS: Bony demineralization. Chronic compression fractures at L4 and L5. No definite hip fracture identified. Dense prostate gland calcifications. There is irregularity of the right inferior pubic ramus suspicious for a fracture. Fracture versus accentuated trabeculation laterally in the right superior pubic ramus. IMPRESSION: 1. The left proximal femur appears intact, there is suspicion for potential fractures of  the right (contralateral) inferior pubic ramus and possibly superior pubic ramus. This raises the possibility of other fractures in the bony pelvis which might be occult radiographically partially due to the patient's bony demineralization. Special attention to the bony pelvis is recommended on the pelvis CT scan ordered for today. 2. Chronic compression fractures at L4 and L5. Electronically Signed   By: Van Clines M.D.   On: 09/29/2021 10:24   CT CHEST ABDOMEN PELVIS WO CONTRAST  Result Date: 09/29/2021 CLINICAL DATA:  Dizziness, fall, initial encounter. EXAM: CT CHEST, ABDOMEN AND PELVIS WITHOUT CONTRAST TECHNIQUE: Multidetector CT imaging of the chest, abdomen and pelvis was performed following the standard protocol without IV contrast. RADIATION DOSE REDUCTION: This exam was performed according to the departmental dose-optimization program which includes automated exposure control, adjustment of the mA and/or kV according to patient size and/or use of iterative reconstruction technique. COMPARISON:  CT abdomen pelvis 09/25/2021 and CT chest 02/15/2021. FINDINGS: CT CHEST FINDINGS Cardiovascular: Atherosclerotic calcification of the aorta, aortic valve and coronary  arteries. There may be papillary muscle calcifications in the left ventricle. Decreased attenuation of the intravascular compartment is indicative of anemia. Heart size within normal limits. No pericardial effusion. Mediastinum/Nodes: No pathologically enlarged mediastinal or axillary lymph nodes. Hilar regions are difficult to evaluate without IV contrast. Esophagus is grossly unremarkable. Lungs/Pleura: Centrilobular and paraseptal emphysema. Biapical pleuroparenchymal scarring. Dependent patchy ground-glass, septal thickening and areas of consolidation in the lower lobes, progressive from 09/25/2021. Mild patchy ground-glass in the posterior aspect of the upper lobes appears chronic. No pleural fluid. Adherent debris in the airway. Musculoskeletal: Osteopenia. Vertebral body augmentation. Old bilateral rib fractures. Numerous thoracic compression deformities with kyphosis, as on 02/15/2021. CT ABDOMEN PELVIS FINDINGS Hepatobiliary: Liver and gallbladder are unremarkable. Similar biliary ductal dilatation. Pancreas: Negative. Spleen: Negative. Adrenals/Urinary Tract: Adrenal glands are unremarkable. 5.9 cm cyst in the lower pole right kidney. Additional low and high attenuation lesions in the kidneys measure up to 1.8 cm and are likely cysts or too small to characterize. No specific follow-up necessary. Ureters are decompressed. Bladder is grossly unremarkable. Stomach/Bowel: Stomach, small bowel, appendix and colon are unremarkable. Vascular/Lymphatic: Atherosclerotic calcification of the aorta. No pathologically enlarged lymph nodes. Reproductive: Prostate is visualized and contains calcifications. Other: Mild presacral soft tissue thickening, unchanged. No free fluid. Mesenteries and peritoneum are unremarkable. Musculoskeletal: Old right inferior pubic ramus fracture. No acute fracture. Degenerative changes in the spine. L1 vertebral body augmentation. L3, L4 and L5 compression fractures as on 09/25/2021.  Large left gluteal hematoma measures approximately 5.0 x 10.2 cm, new from 09/25/2021. Pre-existing inferior left gluteal hematoma (2/121), similar to minimally enlarged. Overlying edema. IMPRESSION: 1. New and enlarging left gluteal hematomas. No underlying acute fracture. 2. Bilateral lower lobe ground-glass, septal thickening and areas of consolidation, worsening in the interval from 09/25/2021, worrisome for progressive pneumonia. 3. Aortic atherosclerosis (ICD10-I70.0). Coronary artery calcifications. 4.  Emphysema (ICD10-J43.9). Electronically Signed   By: Lorin Picket M.D.   On: 09/29/2021 12:13    Orson Eva, DO  [facility]  If 7PM-7AM, please contact night-coverage [email or web address] Password [email or web address] 10/19/2021, 2:35 PM   LOS: 2 days

## 2021-10-20 ENCOUNTER — Inpatient Hospital Stay (HOSPITAL_COMMUNITY): Payer: Medicare Other | Admitting: Dietician

## 2021-10-20 ENCOUNTER — Inpatient Hospital Stay (HOSPITAL_COMMUNITY): Payer: Medicare Other

## 2021-10-20 ENCOUNTER — Inpatient Hospital Stay (HOSPITAL_COMMUNITY): Payer: Medicare Other | Admitting: Hematology

## 2021-10-20 DIAGNOSIS — D61818 Other pancytopenia: Secondary | ICD-10-CM

## 2021-10-20 DIAGNOSIS — D696 Thrombocytopenia, unspecified: Secondary | ICD-10-CM | POA: Diagnosis not present

## 2021-10-20 DIAGNOSIS — R6510 Systemic inflammatory response syndrome (SIRS) of non-infectious origin without acute organ dysfunction: Secondary | ICD-10-CM | POA: Diagnosis not present

## 2021-10-20 DIAGNOSIS — D469 Myelodysplastic syndrome, unspecified: Secondary | ICD-10-CM | POA: Diagnosis not present

## 2021-10-20 DIAGNOSIS — T148XXA Other injury of unspecified body region, initial encounter: Secondary | ICD-10-CM | POA: Diagnosis not present

## 2021-10-20 LAB — CBC WITH DIFFERENTIAL/PLATELET
Basophils Absolute: 0 10*3/uL (ref 0.0–0.1)
Basophils Relative: 0 %
Blasts: 1 %
Eosinophils Absolute: 0 10*3/uL (ref 0.0–0.5)
Eosinophils Relative: 0 %
HCT: 28.9 % — ABNORMAL LOW (ref 39.0–52.0)
Hemoglobin: 9.5 g/dL — ABNORMAL LOW (ref 13.0–17.0)
Lymphocytes Relative: 20 %
Lymphs Abs: 7.3 10*3/uL — ABNORMAL HIGH (ref 0.7–4.0)
MCH: 28.1 pg (ref 26.0–34.0)
MCHC: 32.9 g/dL (ref 30.0–36.0)
MCV: 85.5 fL (ref 80.0–100.0)
Metamyelocytes Relative: 7 %
Monocytes Absolute: 1.8 10*3/uL — ABNORMAL HIGH (ref 0.1–1.0)
Monocytes Relative: 5 %
Myelocytes: 3 %
Neutro Abs: 23.5 10*3/uL — ABNORMAL HIGH (ref 1.7–7.7)
Neutrophils Relative %: 64 %
Platelets: 31 10*3/uL — ABNORMAL LOW (ref 150–400)
RBC: 3.38 MIL/uL — ABNORMAL LOW (ref 4.22–5.81)
RDW: 19 % — ABNORMAL HIGH (ref 11.5–15.5)
Smear Review: DECREASED
WBC: 36.7 10*3/uL — ABNORMAL HIGH (ref 4.0–10.5)
nRBC: 0.9 % — ABNORMAL HIGH (ref 0.0–0.2)
nRBC: 2 /100 WBC — ABNORMAL HIGH

## 2021-10-20 LAB — TYPE AND SCREEN
ABO/RH(D): B NEG
Antibody Screen: NEGATIVE

## 2021-10-20 LAB — BASIC METABOLIC PANEL
Anion gap: 5 (ref 5–15)
BUN: 33 mg/dL — ABNORMAL HIGH (ref 8–23)
CO2: 19 mmol/L — ABNORMAL LOW (ref 22–32)
Calcium: 8.9 mg/dL (ref 8.9–10.3)
Chloride: 110 mmol/L (ref 98–111)
Creatinine, Ser: 1.86 mg/dL — ABNORMAL HIGH (ref 0.61–1.24)
GFR, Estimated: 34 mL/min — ABNORMAL LOW (ref >60)
Glucose, Bld: 91 mg/dL (ref 70–99)
Potassium: 3.1 mmol/L — ABNORMAL LOW (ref 3.5–5.1)
Sodium: 134 mmol/L — ABNORMAL LOW (ref 135–145)

## 2021-10-20 LAB — LACTATE DEHYDROGENASE: LDH: 464 U/L — ABNORMAL HIGH (ref 98–192)

## 2021-10-20 MED ORDER — SODIUM CHLORIDE 0.9% IV SOLUTION: Freq: Once | INTRAVENOUS | Status: AC

## 2021-10-20 NOTE — Plan of Care (Signed)
  Problem: Acute Rehab PT Goals(only PT should resolve) Goal: Pt Will Go Supine/Side To Sit Outcome: Progressing Flowsheets (Taken 10/20/2021 1138) Pt will go Supine/Side to Sit: with supervision Goal: Patient Will Transfer Sit To/From Stand Outcome: Progressing Flowsheets (Taken 10/20/2021 1138) Patient will transfer sit to/from stand: with supervision Goal: Pt Will Transfer Bed To Chair/Chair To Bed Outcome: Progressing Flowsheets (Taken 10/20/2021 1138) Pt will Transfer Bed to Chair/Chair to Bed: min guard assist Goal: Pt Will Ambulate Outcome: Progressing Flowsheets (Taken 10/20/2021 1138) Pt will Ambulate:  50 feet  with min guard assist  with rolling walker   11:39 AM, 10/20/21 Lonell Grandchild, MPT Physical Therapist with [facility] [phone] office [phone] mobile phone

## 2021-10-20 NOTE — Consult Note (Signed)
[facility] Consultation Oncology  Name: [name]      MRN: [id number]    Location: L381/O175-10  Date: 10/20/2021 Time:5:12 PM   REFERRING PHYSICIAN: Dr. Carles Collet  REASON FOR CONSULT: Multiple hospitalizations and severe deconditioning   DIAGNOSIS: MRSE bacteremia and removal of port in the setting of pancytopenia from MDS  HISTORY OF PRESENT ILLNESS: [name] is a very pleasant 88 year old white male who is seen in consultation today at the request of Dr. Carles Collet.  He is well-known to me from office visits.  We have discontinued treatments for his MDS due to poor response and tolerance in December 2022.  Since then he has been receiving blood transfusions as needed.  During the interim, he had multiple admissions to the hospital due to bleeding/anemia and infections.  He recently had a port placed for the second time on 10/02/2021 by Dr. Constance Haw.  He has developed a hematoma due to thrombocytopenia at the port site.  He was evaluated by my PA Casey Burkitt on Friday and was found to be very weak and was admitted to the hospital again.  Blood cultures were positive for methicillin-resistant Staph epidermidis.  He is currently receiving antibiotics.  PAST MEDICAL HISTORY:   Past Medical History:  Diagnosis Date   Bone cancer ([place])    Chronic kidney insufficiency    COPD (chronic obstructive pulmonary disease) (HCC)    GERD (gastroesophageal reflux disease)    Pulmonary nodule     ALLERGIES: No Known Allergies    MEDICATIONS: I have reviewed the patient's current medications.     PAST SURGICAL HISTORY Past Surgical History:  Procedure Laterality Date   BACK SURGERY     BIOPSY  07/15/2021   Procedure: BIOPSY;  Surgeon: Harvel Quale, MD;  [facility];  Service: Gastroenterology;;   CATARACT EXTRACTION W/PHACO Right 03/09/2016   Procedure: CATARACT EXTRACTION PHACO AND INTRAOCULAR LENS PLACEMENT RIGHT EYE CDE=8.58;  Surgeon: Tonny Branch, MD;   Location: [facility];  Service: Ophthalmology;  Laterality: Right;  right   CATARACT EXTRACTION W/PHACO Left 04/13/2016   Procedure: CATARACT EXTRACTION PHACO AND INTRAOCULAR LENS PLACEMENT (IOC);  Surgeon: Tonny Branch, MD;  Location: [facility];  Service: Ophthalmology;  Laterality: Left;  CDE: 8.08   ENTEROSCOPY  07/23/2021   Procedure: ENTEROSCOPY;  Surgeon: Harvel Quale, MD;  [facility];  Service: Gastroenterology;;   ESOPHAGOGASTRODUODENOSCOPY (EGD) WITH PROPOFOL N/A 07/14/2021   Procedure: ESOPHAGOGASTRODUODENOSCOPY (EGD) WITH PROPOFOL;  Surgeon: Eloise Harman, DO;  [facility];  Service: Endoscopy;  Laterality: N/A;   ESOPHAGOGASTRODUODENOSCOPY (EGD) WITH PROPOFOL N/A 07/15/2021   Procedure: ESOPHAGOGASTRODUODENOSCOPY (EGD) WITH PROPOFOL;  Surgeon: Harvel Quale, MD;  [facility];  Service: Gastroenterology;  Laterality: N/A;   ESOPHAGOGASTRODUODENOSCOPY (EGD) WITH PROPOFOL N/A 07/23/2021   Procedure: ESOPHAGOGASTRODUODENOSCOPY (EGD) WITH PROPOFOL;  Surgeon: Harvel Quale, MD;  [facility];  Service: Gastroenterology;  Laterality: N/A;   HEMOSTASIS CLIP PLACEMENT  07/15/2021   Procedure: HEMOSTASIS CLIP PLACEMENT;  Surgeon: Harvel Quale, MD;  [facility];  Service: Gastroenterology;;   HEMOSTASIS CLIP PLACEMENT  07/23/2021   Procedure: HEMOSTASIS CLIP PLACEMENT;  Surgeon: Harvel Quale, MD;  [facility];  Service: Gastroenterology;;   KYPHOPLASTY Bilateral 06/09/2019   Procedure: T11 KYPHOPLASTY;  Surgeon: Consuella Lose, MD;  Location: [facility];  Service: Neurosurgery;  Laterality: Bilateral;   None to Date  10/01/15   [facility] REMOVAL Left 08/07/2021   Procedure:  MINOR REMOVAL PORT-A-CATH;  Surgeon: Virl Cagey, MD;  Location: [facility];  Service: General;  Laterality: Left;  lidocaine 1%   PORTACATH PLACEMENT Left 10/11/2020   Procedure: INSERTION PORT-A-CATH  (attached catheter in left internal jugular);  Surgeon: Virl Cagey, MD;  Location: [facility];  Service: General;  Laterality: Left;   PORTACATH PLACEMENT Right 10/02/2021   Procedure: INSERTION PORT-A-CATH;  Surgeon: Virl Cagey, MD;  Location: [facility];  Service: General;  Laterality: Right;    FAMILY HISTORY: Family History  Problem Relation Age of Onset   Kidney disease Mother    Kidney disease Sister    Colon cancer Neg Hx    Gastric cancer Neg Hx    Esophageal cancer Neg Hx     SOCIAL HISTORY:  reports that he quit smoking about 31 years ago. His smoking use included cigarettes and cigars. He has never used smokeless tobacco. He reports that he does not currently use alcohol. He reports that he does not use drugs.  PERFORMANCE STATUS: The patient's performance status is 2 - Symptomatic, <50% confined to bed  PHYSICAL EXAM: Most Recent Vital Signs: Blood pressure 108/62, pulse 96, temperature 97.9 F (36.6 C), resp. rate 16, height '6\' 4"'$  (1.93 m), weight 121 lb 7.6 oz (55.1 kg), SpO2 94 %. BP 108/62 (BP Location: Right Arm)   Pulse 96   Temp 97.9 F (36.6 C)   Resp 16   Ht '6\' 4"'$  (1.93 m)   Wt 121 lb 7.6 oz (55.1 kg)   SpO2 94%   BMI 14.79 kg/m  General appearance: alert, cooperative, and appears stated age Neurologic: Grossly normal  LABORATORY DATA:  Results for orders placed or performed during the hospital encounter of 10/17/21 (from the past 48 hour(s))  CBC     Status: Abnormal   Collection Time: 10/19/21  2:51 PM  Result Value Ref Range   WBC 40.4 (H) 4.0 - 10.5 K/uL   RBC 3.65 (L) 4.22 - 5.81 MIL/uL   Hemoglobin 10.3 (L) 13.0 - 17.0 g/dL   HCT 31.2 (L) 39.0 - 52.0 %   MCV 85.5 80.0 - 100.0 fL   MCH 28.2 26.0 - 34.0 pg   MCHC 33.0 30.0 - 36.0 g/dL   RDW 18.6 (H) 11.5 - 15.5 %   Platelets 35 (L) 150 - 400 K/uL    Comment: SPECIMEN CHECKED FOR CLOTS Immature Platelet Fraction may be clinically indicated, consider ordering this additional  test [id number] CONSISTENT WITH PREVIOUS RESULT    nRBC 0.7 (H) 0.0 - 0.2 %    Comment: Performed at [facility], [address], [place],  [address]  CBC with Differential/Platelet     Status: Abnormal   Collection Time: 10/20/21  5:01 AM  Result Value Ref Range   WBC 36.7 (H) 4.0 - 10.5 K/uL   RBC 3.38 (L) 4.22 - 5.81 MIL/uL   Hemoglobin 9.5 (L) 13.0 - 17.0 g/dL   HCT 28.9 (L) 39.0 - 52.0 %   MCV 85.5 80.0 - 100.0 fL   MCH 28.1 26.0 - 34.0 pg   MCHC 32.9 30.0 - 36.0 g/dL   RDW 19.0 (H) 11.5 - 15.5 %   Platelets 31 (L) 150 - 400 K/uL    Comment: Immature Platelet Fraction may be clinically indicated, consider ordering this additional test [id number]    nRBC 0.9 (H) 0.0 - 0.2 %   Neutrophils Relative % 64 %   Neutro Abs 23.5 (H) 1.7 - 7.7 K/uL  Lymphocytes Relative 20 %   Lymphs Abs 7.3 (H) 0.7 - 4.0 K/uL   Monocytes Relative 5 %   Monocytes Absolute 1.8 (H) 0.1 - 1.0 K/uL   Eosinophils Relative 0 %   Eosinophils Absolute 0.0 0.0 - 0.5 K/uL   Basophils Relative 0 %   Basophils Absolute 0.0 0.0 - 0.1 K/uL   Smear Review PLATELETS APPEAR DECREASED    nRBC 2 (H) 0 /100 WBC   Metamyelocytes Relative 7 %   Myelocytes 3 %   Blasts 1 %   Reactive, Benign Lymphocytes PRESENT    Polychromasia PRESENT     Comment: Performed at [facility], [address] [place], [place] [address]  Basic metabolic panel     Status: Abnormal   Collection Time: 10/20/21  5:01 AM  Result Value Ref Range   Sodium 134 (L) 135 - 145 mmol/L   Potassium 3.1 (L) 3.5 - 5.1 mmol/L   Chloride 110 98 - 111 mmol/L   CO2 19 (L) 22 - 32 mmol/L   Glucose, Bld 91 70 - 99 mg/dL    Comment: Glucose reference range applies only to samples taken after fasting for at least 8 hours.   BUN 33 (H) 8 - 23 mg/dL   Creatinine, Ser 1.86 (H) 0.61 - 1.24 mg/dL   Calcium 8.9 8.9 - 10.3 mg/dL   GFR, Estimated 34 (L) >60 mL/min    Comment: (NOTE) Calculated using the CKD-EPI Creatinine Equation (2021)    Anion gap  5 5 - 15    Comment: Performed at [facility], [address], [place], [place] [address]  Lactate dehydrogenase     Status: Abnormal   Collection Time: 10/20/21  5:01 AM  Result Value Ref Range   LDH 464 (H) 98 - 192 U/L    Comment: Performed at [facility], [address] [place],  [address]      RADIOGRAPHY: No results found.      ASSESSMENT and PLAN:  1.  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  Treatment on hold due to poor response and tolerance. - He is receiving transfusions as needed. - Pancytopenia is also complicated by recurrent GI bleeds in the setting of thrombocytopenia. - Platelet count today is 31.  He is oozing at port removal site.  He also had coffee-ground emesis. - Recommend platelet transfusion to keep it around 50 K. - He has leukocytosis, worse than his baseline likely from leukemoid reaction from infection.  Peripheral blood has 1% blasts.  Will check for flow cytometry. - Due to his recurrent admissions to the hospital, I have recommended best supportive care in the form of hospice.  He reports that his 2 daughters are coming this week.  We will have a family meeting. - Discussed with Dr. Carles Collet.  2.  MRSE bacteremia: - Port removed on 10/17/2021. - Repeat blood cultures negative to date. - Continue vancomycin and cefepime.  All questions were answered. The patient knows to call the clinic with any problems, questions or concerns. We can certainly see the patient much sooner if necessary.   Derek Jack

## 2021-10-20 NOTE — Progress Notes (Signed)
[name]  Looked at port site and packing is blood soaked. Reinforced the gauze dressing and added more tape to apply pressure. When changing will definitely need saturation with saline as packing removed and replacement.   Last site bled for over 1 week before started to improve. Patient feels like this one is better than last time.  Port and hematoma with staph epidermidis.   Updated team. Will be going to [facility].   [name], MD [facility] [address] [place], [place] [id number] [phone] (office)

## 2021-10-20 NOTE — Progress Notes (Addendum)
PROGRESS NOTE  [name] JQZ:[id number] DOB: [birth year] DOA: 10/17/2021 PCP: Daneil Dolin, MD  Brief History:  88 year old male with a history of myelodysplastic syndrome, CKD stage IIIb, COPD, GERD, chronic respiratory failure on 2 L, COPD, chronic hypotension on midodrine presented to the [facility] for routine visit today.  The patient was seen at the cancer center on 10/14/2021 secondary to hematoma and possible cellulitis around his right IJ Port-A-Cath site.  Blood cultures were drawn on that day and are currently growing methicillin-resistant Staphylococcus epidermidis.  Blood cultures on 10/12/2021 were negative.  The patient was sent back to the [facility] with cephalexin for 7-day course.  He returned to the clinic today with increasing generalized weakness, and nausea and vomiting of 1 day duration.  The patient feels that he was vomiting some blackish type material on 10/16/2021.  He denies any fevers, chills, chest pain, shortness breath, abdominal pain, dysuria, hematuria.  Notably, the patient recently had his current Port-A-Cath placed on 10/02/2021.  His previous Port-A-Cath was removed on 08/07/2021 due to hematoma and possible infection.  He was treated with Augmentin at that time. The patient noted that he has had progressive pain and swelling about his right-sided Port-A-Cath since 10/10/2021.  When comparing pictures from his previous visit on 10/14/2021, it appears that the patient's edema and hematoma may have progressed. Notably, the patient has had numerous hospital admissions recently.  Patient was recently admitted to the hospital from 5/11 to 5/13, after he suffered a fall and developed a significant gluteal hematoma resulting in blood loss anemia.  He has been transfused multiple units of PRBCs as well as platelets to keep hemoglobin greater than 8 and platelets greater than 50,000.  He was subsequently discharged to [facility] for  rehab. In addition, he had an admission from 09/20/2021 to 09/21/2021 for symptomatic anemia.  He was transfused 2 units PRBC.Marland Kitchen  He was admitted from 08/09/2021 to 08/11/2021 secondary to bleeding from his left-sided Port-A-Cath removal site.  He was transfused 2 units PRBC at that time.  He also received 1 unit platelets.   Assessment/Plan: Infected Right IJ Port-A-Cath hematoma -cultures = MRSE -General surgery consulted; discussed with Dr. Constance Haw --Port-A-Cath removal---done on 10/17/21 -Repeat blood cultures x2 sets--neg to date -follow up cultures from removed port-a-cath = staph epi -continue vancomycin -plan IV vanc at least through 10/24/21, then switch to po abx    MRSE Bacteremia 10/14/21 blood culture = MRSE --called microbiology to work up sensitivity of 5/30 blood culture --source = port-a-cath (removed 10/17/21) Follow 10/17/21 blood culture--neg to date -plan IV vanc at least through 10/24/21, then switch to po abx    Coffee-ground emesis -Start pantoprazole IV twice daily>>continue 3/8 EGD--Localized hemorrhagic mucosa with active bleeding and with stigmata of bleeding was found in the proximal jejunum. Upon lavage it looked as a vascularized area, ?possible Dieulafoy vs AVM.  Hemoclip placed -Consult GI appreciated>>continue PPI bid, treat conservatively   Acute on chronic anemia Baseline hemoglobin with wide variation, but usually ~8 range -Transfused 2 units PRBC 10/18/21 -transfuse one unit platelets 6/5   Myelodysplastic syndrome (HCC) Follows Dr. Delton Coombes -follow up with Dr. Delton Coombes states treatment of MDS on hold due to hospitalizations for syncope Previously on Azacitidine 09/16/20 to 04/21/21 --WBC continues to climb>>add diff to CBC --concerned about leukemic conversion --CBC with diff in am --6/5 discussed with Dr. Delton Coombes   Thrombocytopenia [facility]) Has been chronic  due to MDS,  30-40K range   CKD (chronic kidney disease), stage IIIB Baseline creatinine  1.5-1.8 Serial BMPs   COPD (chronic obstructive pulmonary disease) (HCC) Baseline 3 L oxygen requirement Continue albuterol as needed Stable   Chronic hypotension -Continue on midodrine   Fall with left gluteal hematoma -currently at [facility] for rehab Left hip hematoma appears to be involuting   Goals of Care 6/5--long discussion with patient's daughter Stanton Kidney --may be beyond window of taking patient to [place] --She is coming back to [place] in next 1-2 days to have [place] discussion with patient and medical team --considering allow pt to return to him home here in [place] with hospice vs residential hospice in [facility] --if pt were to go his home here, Stanton Kidney and her family would be willing to provide care for him (with expectation of never returning to hospital) --6/5 total time 50 min.     Family Communication:   daughter updated at bedside 6/5   Consultants:  GI   Code Status:  DNR   DVT Prophylaxis:  SCDs     Procedures: As Listed in Progress Note Above   Antibiotics: Vanc 6/2>> Cefepime 6/2>>6/4         Subjective: Patient denies fevers, chills, headache, chest pain, dyspnea, nausea, vomiting, diarrhea, abdominal pain, dysuria,    Objective: Vitals:   10/19/21 0709 10/19/21 1335 10/20/21 0708 10/20/21 1438  BP:  (!) 103/53  108/62  Pulse:  84  96  Resp:  19  16  Temp:  98.8 F (37.1 C)  97.9 F (36.6 C)  TempSrc:  Oral    SpO2: 94% 90% 92% 94%  Weight:      Height:        Intake/Output Summary (Last 24 hours) at 10/20/2021 1719 Last data filed at 10/20/2021 1500 Gross per 24 hour  Intake 750.04 ml  Output 750 ml  Net 0.04 ml   Weight change:  Exam:  General:  Pt is alert, follows commands appropriately, not in acute distress HEENT: No icterus, No thrush, No neck mass, [place]/AT Cardiovascular: RRR, S1/S2, no rubs, no gallops Respiratory: bibasilar rales.  Diminished BS at bases Abdomen: Soft/+BS, non tender, non distended, no  guarding Extremities: No edema, No lymphangitis, No petechiae, No rashes, no synovitis   Data Reviewed: I have personally reviewed following labs and imaging studies Basic Metabolic Panel: Recent Labs  Lab 10/17/21 1433 10/18/21 0229 10/20/21 0501  NA 135 135 134*  K 4.3 3.9 3.1*  CL 108 111 110  CO2 22 21* 19*  GLUCOSE 102* 87 91  BUN 42* 42* 33*  CREATININE 2.20* 2.19* 1.86*  CALCIUM 9.1 8.3* 8.9  MG  --  1.8  --    Liver Function Tests: No results for input(s): AST, ALT, ALKPHOS, BILITOT, PROT, ALBUMIN in the last 168 hours. No results for input(s): LIPASE, AMYLASE in the last 168 hours. No results for input(s): AMMONIA in the last 168 hours. Coagulation Profile: No results for input(s): INR, PROTIME in the last 168 hours. CBC: Recent Labs  Lab 10/16/21 0530 10/17/21 0922 10/18/21 0038 10/18/21 0229 10/18/21 1410 10/19/21 1451 10/20/21 0501  WBC 23.7* 38.5*  --  44.8*  --  40.4* 36.7*  NEUTROABS 10.5*  --   --   --   --   --  23.5*  HGB 7.6* 7.8* 6.3* 6.3* 10.0* 10.3* 9.5*  HCT 24.1* 25.1* 21.0* 20.6* 31.6* 31.2* 28.9*  MCV 83.4 84.5  --  84.4  --  85.5 85.5  PLT 38* 44*  --  62*  --  35* 31*   Cardiac Enzymes: No results for input(s): CKTOTAL, CKMB, CKMBINDEX, TROPONINI in the last 168 hours. BNP: Invalid input(s): POCBNP CBG: No results for input(s): GLUCAP in the last 168 hours. HbA1C: No results for input(s): HGBA1C in the last 72 hours. Urine analysis:    Component Value Date/Time   COLORURINE YELLOW 10/17/2021 1351   APPEARANCEUR HAZY (A) 10/17/2021 1351   APPEARANCEUR Hazy (A) 03/26/2021 1334   LABSPEC 1.015 10/17/2021 1351   PHURINE 5.0 10/17/2021 1351   GLUCOSEU NEGATIVE 10/17/2021 1351   HGBUR MODERATE (A) 10/17/2021 1351   BILIRUBINUR NEGATIVE 10/17/2021 1351   BILIRUBINUR Negative 03/26/2021 1334   KETONESUR NEGATIVE 10/17/2021 1351   PROTEINUR 100 (A) 10/17/2021 1351   UROBILINOGEN 0.2 08/15/2013 1240   NITRITE NEGATIVE 10/17/2021  1351   LEUKOCYTESUR NEGATIVE 10/17/2021 1351   Sepsis Labs: '@[email or web address]'$ (procalcitonin:4,lacticidven:4) ) Recent Results (from the past 240 hour(s))  Blood Culture (routine x 2)     Status: None   Collection Time: 10/12/21 11:19 AM   Specimen: BLOOD RIGHT FOREARM  Result Value Ref Range Status   Specimen Description   Final    BLOOD RIGHT FOREARM BOTTLES DRAWN AEROBIC AND ANAEROBIC   Special Requests Blood Culture adequate volume  Final   Culture   Final    NO GROWTH 6 DAYS Performed at [facility], [address], [place], [place] [address]    Report Status 10/18/2021 FINAL  Final  Blood Culture (routine x 2)     Status: None   Collection Time: 10/12/21 11:27 AM   Specimen: BLOOD LEFT FOREARM  Result Value Ref Range Status   Specimen Description   Final    BLOOD LEFT FOREARM BOTTLES DRAWN AEROBIC AND ANAEROBIC   Special Requests Blood Culture adequate volume  Final   Culture   Final    NO GROWTH 6 DAYS Performed at [facility], [address], [place], [place] [address]    Report Status 10/18/2021 FINAL  Final  Culture, blood (routine x 2)     Status: Abnormal (Preliminary result)   Collection Time: 10/14/21  3:12 PM   Specimen: Left Antecubital; Blood  Result Value Ref Range Status   Specimen Description   Final    LEFT ANTECUBITAL Performed at [name], [address] [place], [place] [address]    Special Requests   Final    BOTTLES DRAWN AEROBIC AND ANAEROBIC Blood Culture adequate volume Performed at [facility], [address], [place], [place] [address]    Culture  Setup Time   Final    GRAM POSITIVE COCCI Gram Stain Report Called to,Read Back By and Verified With: T MYERS [id number] K FORSYTH AEROBIC BOTTLE ONLY CRITICAL RESULT CALLED TO, READ BACK BY AND VERIFIED WITH: RN Bonney Leitz [phone] '@2034'$  FH    Culture (A)  Final    STAPHYLOCOCCUS EPIDERMIDIS SUSCEPTIBILITIES TO FOLLOW Performed at [facility], [place] [address] [place], [place] [address]     Report Status PENDING  Incomplete  Blood Culture ID Panel (Reflexed)     Status: Abnormal   Collection Time: 10/14/21  3:12 PM  Result Value Ref Range Status   Enterococcus faecalis NOT DETECTED NOT DETECTED Final   Enterococcus Faecium NOT DETECTED NOT DETECTED Final   Listeria monocytogenes NOT DETECTED NOT DETECTED Final   Staphylococcus species DETECTED (A) NOT DETECTED Final    Comment: CRITICAL RESULT CALLED TO, READ BACK  BY AND VERIFIED WITH: RN Ladell Pier [phone] '@2034'$  FH    Staphylococcus aureus (BCID) NOT DETECTED NOT DETECTED Final   Staphylococcus epidermidis DETECTED (A) NOT DETECTED Final    Comment: Methicillin (oxacillin) resistant coagulase negative staphylococcus. Possible blood culture contaminant (unless isolated from more than one blood culture draw or clinical case suggests pathogenicity). No antibiotic treatment is indicated for blood  culture contaminants. CRITICAL RESULT CALLED TO, READ BACK BY AND VERIFIED WITH: RN Bonney Leitz [phone] '@2034'$  FH    Staphylococcus lugdunensis NOT DETECTED NOT DETECTED Final   Streptococcus species NOT DETECTED NOT DETECTED Final   Streptococcus agalactiae NOT DETECTED NOT DETECTED Final   Streptococcus pneumoniae NOT DETECTED NOT DETECTED Final   Streptococcus pyogenes NOT DETECTED NOT DETECTED Final   A.calcoaceticus-baumannii NOT DETECTED NOT DETECTED Final   Bacteroides fragilis NOT DETECTED NOT DETECTED Final   Enterobacterales NOT DETECTED NOT DETECTED Final   Enterobacter cloacae complex NOT DETECTED NOT DETECTED Final   Escherichia coli NOT DETECTED NOT DETECTED Final   Klebsiella aerogenes NOT DETECTED NOT DETECTED Final   Klebsiella oxytoca NOT DETECTED NOT DETECTED Final   Klebsiella pneumoniae NOT DETECTED NOT DETECTED Final   Proteus species NOT DETECTED NOT DETECTED Final   Salmonella species NOT DETECTED NOT DETECTED Final   Serratia marcescens NOT DETECTED NOT DETECTED Final   Haemophilus influenzae NOT DETECTED NOT  DETECTED Final   Neisseria meningitidis NOT DETECTED NOT DETECTED Final   Pseudomonas aeruginosa NOT DETECTED NOT DETECTED Final   Stenotrophomonas maltophilia NOT DETECTED NOT DETECTED Final   Candida albicans NOT DETECTED NOT DETECTED Final   Candida auris NOT DETECTED NOT DETECTED Final   Candida glabrata NOT DETECTED NOT DETECTED Final   Candida krusei NOT DETECTED NOT DETECTED Final   Candida parapsilosis NOT DETECTED NOT DETECTED Final   Candida tropicalis NOT DETECTED NOT DETECTED Final   Cryptococcus neoformans/gattii NOT DETECTED NOT DETECTED Final   Methicillin resistance mecA/C DETECTED (A) NOT DETECTED Final    Comment: CRITICAL RESULT CALLED TO, READ BACK BY AND VERIFIED WITH: RN Ladell Pier [phone] '@2034'$  FH Performed at [facility], [address] [place], [place] [address]   Aerobic/Anaerobic Culture w Gram Stain (surgical/deep wound)     Status: None (Preliminary result)   Collection Time: 10/17/21 11:57 AM   Specimen: Catheter Tip  Result Value Ref Range Status   Specimen Description   Final    CATH TIP Performed at [facility], [address], [place], [place] [address]    Special Requests   Final    Immunocompromised Performed at [facility], [address], [place], [place] [address]    Gram Stain   Final    NO WBC SEEN NO ORGANISMS SEEN Performed at [facility], [place] [address], [place], [place] [address]    Culture   Final    RARE STAPHYLOCOCCUS EPIDERMIDIS NO ANAEROBES ISOLATED; CULTURE IN PROGRESS FOR 5 DAYS    Report Status PENDING  Incomplete   Organism ID, Bacteria STAPHYLOCOCCUS EPIDERMIDIS  Final      Susceptibility   Staphylococcus epidermidis - MIC*    CIPROFLOXACIN >=8 RESISTANT Resistant     ERYTHROMYCIN >=8 RESISTANT Resistant     GENTAMICIN <=0.5 SENSITIVE Sensitive     OXACILLIN >=4 RESISTANT Resistant     TETRACYCLINE 2 SENSITIVE Sensitive     VANCOMYCIN 1 SENSITIVE Sensitive     TRIMETH/SULFA 80 RESISTANT Resistant      CLINDAMYCIN >=8 RESISTANT Resistant  RIFAMPIN <=0.5 SENSITIVE Sensitive     Inducible Clindamycin NEGATIVE Sensitive     * RARE STAPHYLOCOCCUS EPIDERMIDIS  Aerobic/Anaerobic Culture w Gram Stain (surgical/deep wound)     Status: None (Preliminary result)   Collection Time: 10/17/21 11:58 AM   Specimen: Wound  Result Value Ref Range Status   Specimen Description   Final    WOUND HEMATOMA Performed at [facility], [address], [place], [place] [address]    Special Requests   Final    Immunocompromised Performed at [facility], [address], [place], [place] [address]    Gram Stain   Final    ABUNDANT WBC PRESENT, PREDOMINANTLY PMN NO ORGANISMS SEEN Performed at [facility], [place] [address] [place], [place] [address]    Culture   Final    FEW STAPHYLOCOCCUS EPIDERMIDIS NO ANAEROBES ISOLATED; CULTURE IN PROGRESS FOR 5 DAYS    Report Status PENDING  Incomplete   Organism ID, Bacteria STAPHYLOCOCCUS EPIDERMIDIS  Final      Susceptibility   Staphylococcus epidermidis - MIC*    CIPROFLOXACIN >=8 RESISTANT Resistant     ERYTHROMYCIN >=8 RESISTANT Resistant     GENTAMICIN <=0.5 SENSITIVE Sensitive     OXACILLIN >=4 RESISTANT Resistant     TETRACYCLINE 2 SENSITIVE Sensitive     VANCOMYCIN 1 SENSITIVE Sensitive     TRIMETH/SULFA 80 RESISTANT Resistant     CLINDAMYCIN >=8 RESISTANT Resistant     RIFAMPIN <=0.5 SENSITIVE Sensitive     Inducible Clindamycin NEGATIVE Sensitive     * FEW STAPHYLOCOCCUS EPIDERMIDIS  MRSA Next Gen by PCR, Nasal     Status: None   Collection Time: 10/17/21 12:11 PM   Specimen: Nasal Mucosa; Nasal Swab  Result Value Ref Range Status   MRSA by PCR Next Gen NOT DETECTED NOT DETECTED Final    Comment: (NOTE) The GeneXpert MRSA Assay (FDA approved for NASAL specimens only), is one component of a comprehensive MRSA colonization surveillance program. It is not intended to diagnose MRSA infection nor to guide or monitor treatment for MRSA  infections. Test performance is not FDA approved in patients less than 61 years old. Performed at [facility], [address] [place], [place] [address]   Culture, blood (Routine X 2) w Reflex to ID Panel     Status: None (Preliminary result)   Collection Time: 10/17/21  2:33 PM   Specimen: BLOOD LEFT HAND  Result Value Ref Range Status   Specimen Description BLOOD LEFT HAND  Final   Special Requests   Final    BOTTLES DRAWN AEROBIC AND ANAEROBIC Blood Culture adequate volume   Culture   Final    NO GROWTH 3 DAYS Performed at [facility], [address], [place], [place] [address]    Report Status PENDING  Incomplete  Culture, blood (Routine X 2) w Reflex to ID Panel     Status: None (Preliminary result)   Collection Time: 10/17/21  2:33 PM   Specimen: BLOOD RIGHT HAND  Result Value Ref Range Status   Specimen Description BLOOD RIGHT HAND  Final   Special Requests   Final    BOTTLES DRAWN AEROBIC AND ANAEROBIC Blood Culture adequate volume   Culture   Final    NO GROWTH 3 DAYS Performed at [facility], [address] [place], [place] [address]    Report Status PENDING  Incomplete     Scheduled Meds:  sodium chloride  250 mL Intravenous Once   sodium chloride  Intravenous Once   sodium chloride   Intravenous Once   sodium chloride   Intravenous Once   acetaminophen  650 mg Oral Once   Chlorhexidine Gluconate Cloth  6 each Topical Q0600   diphenhydrAMINE  25 mg Oral Once   mouth rinse  15 mL Mouth Rinse BID   midodrine  5 mg Oral TID WC   pantoprazole (PROTONIX) IV  40 mg Intravenous Q12H   predniSONE  5 mg Oral Q breakfast   umeclidinium-vilanterol  1 puff Inhalation Daily   Continuous Infusions:  vancomycin Stopped (10/19/21 2212)    Procedures/Studies: CT ABDOMEN PELVIS WO CONTRAST  Result Date: 09/25/2021 CLINICAL DATA:  Weakness and anemia.  Fall on left side 09/22/2021 EXAM: CT ABDOMEN AND PELVIS WITHOUT CONTRAST TECHNIQUE: Multidetector CT imaging of the  abdomen and pelvis was performed following the standard protocol without IV contrast. RADIATION DOSE REDUCTION: This exam was performed according to the departmental dose-optimization program which includes automated exposure control, adjustment of the mA and/or kV according to patient size and/or use of iterative reconstruction technique. COMPARISON:  CT 09/23/2016 FINDINGS: Lower chest: Basilar emphysema. Patchy consolidation within the left greater than right lower lobes suspicious for pneumonia, including aspiration. Trace left pleural effusion. Hepatobiliary: There is no evidence of focal hepatic abnormality on this unenhanced exam. Gallbladder physiologically distended, no calcified stone. No biliary dilatation. Pancreas: No ductal dilatation or inflammation. Normal for age pancreatic atrophy. Spleen: The spleen measures 12.1 x 7.7 x 9.8 cm (volume = 480 cm^3), mildly enlarged. No focal abnormality or perisplenic fluid on this unenhanced exam. Adrenals/Urinary Tract: No adrenal nodule. There is no hydronephrosis. There is left renal parenchymal thinning. Multiple bilateral cysts of varying sizes in density, incompletely characterized on this unenhanced exam. These are grossly unchanged from 2018 exam, and no specific imaging follow-up is recommended. Unremarkable urinary bladder. Stomach/Bowel: There is ingested material distending the stomach. Occasional fluid-filled small bowel without obstruction or inflammation. Moderate volume of colonic stool. No colonic wall thickening or inflammatory change. Left colonic diverticulosis, prominent in the sigmoid, no diverticulitis. Vascular/Lymphatic: Aortic atherosclerosis. No aortic aneurysm. There is no periaortic stranding. No retroperitoneal fluid. No bulky abdominopelvic adenopathy. Reproductive: Prostatic calcifications. Other: There is soft tissue edema involving the greater than right flank subcutaneous tissues dependently, as well as midline skin thickening.  Edematous changes extend over the left hip where there is also enlargement of the left gluteus muscles as described below. There is no free air or free fluid within the abdomen or pelvis. The right testis may be located in the inguinal canal. Musculoskeletal: Enlargement and heterogeneous left gluteal musculature may be related to hematoma in the setting of recent fall. There is overlying stranding of the left lateral hip and flank soft tissues. No evidence of retroperitoneal hematoma. The bones are diffusely under mineralized. Allowing for this limitation, no acute fracture of the pelvis, hip, or included ribs. There are chronic compression fractures of T11 and L2 with vertebral augmentation. Chronic compression deformity of L3, L4, and L5, not significantly changed from 06/05/2019 lumbar radiographs. IMPRESSION: 1. Enlargement and heterogeneous left gluteal musculature may be related to hematoma in the setting of recent fall. There is overlying stranding of the left lateral hip and flank soft tissues. No pelvis or hip fracture. 2. No evidence of retroperitoneal hematoma. 3. Patchy consolidation within the left greater than right lower lobes suspicious for pneumonia, including aspiration. Trace left pleural effusion. 4. Colonic diverticulosis without diverticulitis. 5. Mild splenomegaly. 6. Multiple chronic compression fractures of  the lower thoracic and lumbar spine. Aortic Atherosclerosis (ICD10-I70.0) and Emphysema (ICD10-J43.9). Electronically Signed   By: Keith Rake M.D.   On: 09/25/2021 19:17   CT Head Wo Contrast  Result Date: 09/29/2021 CLINICAL DATA:  Head trauma, minor (Age >= 65y) EXAM: CT HEAD WITHOUT CONTRAST TECHNIQUE: Contiguous axial images were obtained from the base of the skull through the vertex without intravenous contrast. RADIATION DOSE REDUCTION: This exam was performed according to the departmental dose-optimization program which includes automated exposure control, adjustment of  the mA and/or kV according to patient size and/or use of iterative reconstruction technique. COMPARISON:  June 2022 FINDINGS: Brain: There is no acute intracranial hemorrhage, mass effect, or edema. Gray-white differentiation is preserved. There is no extra-axial fluid collection. Prominence of the ventricles and sulci reflects similar parenchymal volume loss. Patchy hypoattenuation in the supratentorial white matter is nonspecific but probably reflects similar chronic microvascular ischemic changes. Vascular: There is atherosclerotic calcification at the skull base. Skull: Calvarium is unremarkable. Sinuses/Orbits: No acute finding. Other: None. IMPRESSION: No evidence of acute intracranial injury. Electronically Signed   By: Macy Mis M.D.   On: 09/29/2021 12:04   CT Cervical Spine Wo Contrast  Result Date: 09/29/2021 CLINICAL DATA:  Neck trauma (Age >= 65y) EXAM: CT CERVICAL SPINE WITHOUT CONTRAST TECHNIQUE: Multidetector CT imaging of the cervical spine was performed without intravenous contrast. Multiplanar CT image reconstructions were also generated. RADIATION DOSE REDUCTION: This exam was performed according to the departmental dose-optimization program which includes automated exposure control, adjustment of the mA and/or kV according to patient size and/or use of iterative reconstruction technique. COMPARISON:  None Available. FINDINGS: Alignment: Trace retrolisthesis at C4-C5. Skull base and vertebrae: Decreased osseous mineralization. Degenerative endplate irregularity. No acute fracture. Soft tissues and spinal canal: No prevertebral fluid or swelling. No visible canal hematoma. Disc levels: Multilevel degenerative changes are present including disc space narrowing, endplate osteophytes, and facet and uncovertebral hypertrophy. Upper chest: Dictated separately. Other: Mild calcified plaque at the common carotid bifurcations. IMPRESSION: No acute cervical spine fracture. Electronically Signed    By: Macy Mis M.D.   On: 09/29/2021 11:59   CT CHEST HIGH RESOLUTION  Result Date: 10/12/2021 CLINICAL DATA:  Pulmonary infiltrates, history of myelodysplastic syndrome EXAM: CT CHEST WITHOUT CONTRAST TECHNIQUE: Multidetector CT imaging of the chest was performed following the standard protocol without intravenous contrast. High resolution imaging of the lungs, as well as inspiratory and expiratory imaging, was performed. RADIATION DOSE REDUCTION: This exam was performed according to the departmental dose-optimization program which includes automated exposure control, adjustment of the mA and/or kV according to patient size and/or use of iterative reconstruction technique. COMPARISON:  Chest 09/29/2021 FINDINGS: Cardiovascular: Right chest port catheter. Aortic atherosclerosis. Normal heart size. Three-vessel coronary artery calcifications. No pericardial effusion. Mediastinum/Nodes: No enlarged mediastinal, hilar, or axillary lymph nodes. Thyroid gland, trachea, and esophagus demonstrate no significant findings. Lungs/Pleura: Moderate centrilobular and paraseptal emphysema. Improved irregular and heterogeneous ground-glass airspace opacity throughout the lung bases, with associated bronchiolar plugging. No significant air trapping on expiratory phase imaging. No pleural effusion or pneumothorax. Upper Abdomen: No acute abnormality. Musculoskeletal: No chest wall abnormality. No suspicious osseous lesions identified. Multiple wedge deformities throughout the thoracic spine, unchanged, status post vertebral cement augmentation of T11. IMPRESSION: 1. Improved irregular and heterogeneous ground-glass airspace opacity throughout the lung bases, with associated bronchiolar plugging. Findings are consistent with improved infection or aspiration. 2. There may be some degree of underlying scarring and or fibrosis of the lung bases, however  this is difficult to assess in the setting of superimposed acute airspace  disease. There are no specific features to distinguish the possibility of fibrotic interstitial lung disease from bland scarring. 3. Emphysema. 4. Coronary artery disease. Aortic Atherosclerosis (ICD10-I70.0) and Emphysema (ICD10-J43.9). Electronically Signed   By: Delanna Ahmadi M.D.   On: 10/12/2021 12:40   CT T-SPINE NO CHARGE  Result Date: 09/29/2021 CLINICAL DATA:  Fall EXAM: CT THORACIC AND LUMBAR SPINE WITHOUT CONTRAST TECHNIQUE: Multidetector CT imaging of the thoracic and lumbar spine was performed without contrast. Multiplanar CT image reconstructions were also generated. RADIATION DOSE REDUCTION: This exam was performed according to the departmental dose-optimization program which includes automated exposure control, adjustment of the mA and/or kV according to patient size and/or use of iterative reconstruction technique. COMPARISON:  CT abdomen pelvis 09/25/2021, CT chest 02/15/2021, CT L-spine 06/02/2019 FINDINGS: CT THORACIC SPINE FINDINGS Alignment: Mild S shaped curvature. Exaggeration of the normal thoracic kyphosis in part secondary to chronic compression deformities of T6-T8). No listhesis. Vertebrae: No acute fracture or suspicious osseous lesion. Unchanged compression deformities of T6, T7, T8, T9, and T11, with redemonstrated kyphoplasty in T11. Paraspinal and other soft tissues: Please see same-day CT chest abdomen pelvis. Disc levels: No high-grade spinal canal stenosis. Moderate bilateral neural foraminal narrowing at T10-T11 and T11-T12. CT LUMBAR SPINE FINDINGS Segmentation: 5 lumbar type vertebral bodies. Partial sacralization of L5 with bilateral L5-S1 pseudoarticulation of broadened left-greater-than-right L5 transverse processes with the sacral ala. Alignment: Mild S shaped curvature of the thoracolumbar spine. Straightening and mild reversal of the normal lumbar lordosis. No significant listhesis. Vertebrae: No acute fracture or suspicious osseous lesion. Redemonstrated compression  deformities at L2, L3, L4, and L5, with redemonstrated kyphoplasty at L2. Paraspinal and other soft tissues: Please see same-day CT chest abdomen pelvis. Disc levels: Redemonstrated degenerative changes, with mild spinal canal stenosis at L2-L3 and L3-L4. No significant neural foraminal narrowing. IMPRESSION: No acute fracture or traumatic listhesis in the thoracic spine or lumbar spine. Unchanged multilevel compression deformities, status post kyphoplasty at T11 and T12. Please see same-day CT chest abdomen pelvis for soft tissue findings. Electronically Signed   By: Merilyn Baba M.D.   On: 09/29/2021 12:09   CT L-SPINE NO CHARGE  Result Date: 09/29/2021 CLINICAL DATA:  Fall EXAM: CT THORACIC AND LUMBAR SPINE WITHOUT CONTRAST TECHNIQUE: Multidetector CT imaging of the thoracic and lumbar spine was performed without contrast. Multiplanar CT image reconstructions were also generated. RADIATION DOSE REDUCTION: This exam was performed according to the departmental dose-optimization program which includes automated exposure control, adjustment of the mA and/or kV according to patient size and/or use of iterative reconstruction technique. COMPARISON:  CT abdomen pelvis 09/25/2021, CT chest 02/15/2021, CT L-spine 06/02/2019 FINDINGS: CT THORACIC SPINE FINDINGS Alignment: Mild S shaped curvature. Exaggeration of the normal thoracic kyphosis in part secondary to chronic compression deformities of T6-T8). No listhesis. Vertebrae: No acute fracture or suspicious osseous lesion. Unchanged compression deformities of T6, T7, T8, T9, and T11, with redemonstrated kyphoplasty in T11. Paraspinal and other soft tissues: Please see same-day CT chest abdomen pelvis. Disc levels: No high-grade spinal canal stenosis. Moderate bilateral neural foraminal narrowing at T10-T11 and T11-T12. CT LUMBAR SPINE FINDINGS Segmentation: 5 lumbar type vertebral bodies. Partial sacralization of L5 with bilateral L5-S1 pseudoarticulation of  broadened left-greater-than-right L5 transverse processes with the sacral ala. Alignment: Mild S shaped curvature of the thoracolumbar spine. Straightening and mild reversal of the normal lumbar lordosis. No significant listhesis. Vertebrae: No acute fracture or suspicious  osseous lesion. Redemonstrated compression deformities at L2, L3, L4, and L5, with redemonstrated kyphoplasty at L2. Paraspinal and other soft tissues: Please see same-day CT chest abdomen pelvis. Disc levels: Redemonstrated degenerative changes, with mild spinal canal stenosis at L2-L3 and L3-L4. No significant neural foraminal narrowing. IMPRESSION: No acute fracture or traumatic listhesis in the thoracic spine or lumbar spine. Unchanged multilevel compression deformities, status post kyphoplasty at T11 and T12. Please see same-day CT chest abdomen pelvis for soft tissue findings. Electronically Signed   By: Merilyn Baba M.D.   On: 09/29/2021 12:09   DG Chest Port 1 View  Result Date: 10/17/2021 CLINICAL DATA:  Removal of right IJ chest port EXAM: PORTABLE CHEST 1 VIEW COMPARISON:  Previous studies including the examination of 10/12/2021 FINDINGS: There is interval removal of right IJ chest port. There is no pleural effusion or pneumothorax. Severe emphysematous changes are noted. Increased interstitial markings are seen in both lower lung fields and right upper lung fields. There are no new infiltrates or signs of pulmonary edema. There is no significant pleural effusion or pneumothorax. There is previous vertebroplasty and compression fractures in the thoracic and upper lumbar vertebrae. IMPRESSION: COPD. Linear densities in the lower lung fields suggest scarring. There are no new infiltrates or signs of pulmonary edema. Interval removal of right IJ chest port. Electronically Signed   By: Elmer Picker M.D.   On: 10/17/2021 15:16   DG Chest Port 1 View  Result Date: 10/12/2021 CLINICAL DATA:  Short of breath since last night.  Infection at the Port-A-Cath site. History of COPD. History of bone carcinoma. EXAM: PORTABLE CHEST 1 VIEW COMPARISON:  10/02/2021 and older studies. High-resolution chest CT, 10/10/2021. FINDINGS: Cardiac silhouette is normal in size. No mediastinal or hilar masses. Right anterior chest wall, internal jugular, Port-A-Cath is stable. Lungs show diffuse irregular interstitial thickening consistent with fibrosis with underlying emphysema, stable. No lung consolidation. No convincing pulmonary edema. No pleural effusion or pneumothorax. No acute skeletal abnormality. Previous vertebral fractures treated with vertebroplasty. Skeletal structures are diffusely demineralized. IMPRESSION: 1. No acute cardiopulmonary disease. 2. Chronic lung findings Electronically Signed   By: Lajean Manes M.D.   On: 10/12/2021 11:54   DG Chest Port 1 View  Result Date: 10/02/2021 CLINICAL DATA:  Postop port placement EXAM: PORTABLE CHEST 1 VIEW COMPARISON:  Chest radiograph 09/25/2021 FINDINGS: There is a new right chest wall port in place with tip terminating in the mid SVC. The cardiomediastinal silhouette is stable. There is extensive coarsened interstitial markings throughout both lungs. There is a new small left pleural effusion with adjacent atelectasis. There is no other new or worsening focal airspace disease. There is no significant right pleural effusion. There is no pneumothorax The bones are stable. IMPRESSION: 1. New right chest wall port in place with the tip terminating in the mid SVC. 2. New small left pleural effusion with adjacent atelectasis or pneumonia. Electronically Signed   By: Valetta Mole M.D.   On: 10/02/2021 11:44   DG Chest Port 1 View  Result Date: 09/25/2021 CLINICAL DATA:  Shortness of breath EXAM: PORTABLE CHEST 1 VIEW COMPARISON:  Chest x-ray dated Sep 20, 2021 FINDINGS: Cardiac and mediastinal contours are unchanged. Bilateral reticular opacities, most prominent in the lung bases, similar prior  exams and likely due to background emphysema and scarring. No new focal consolidation. Previously described right lung opacity is no longer visible. No large pleural effusion or pneumothorax. IMPRESSION: No new focal consolidation. Previously described right lung opacity is  no longer visible, likely resolved atelectasis. Electronically Signed   By: Yetta Glassman M.D.   On: 09/25/2021 12:11   DG C-Arm 1-60 Min-No Report  Result Date: 10/02/2021 Fluoroscopy was utilized by the requesting physician.  No radiographic interpretation.   DG HIP UNILAT WITH PELVIS 2-3 VIEWS LEFT  Result Date: 10/07/2021 CLINICAL DATA:  Pain in left hip. EXAM: DG HIP (WITH OR WITHOUT PELVIS) 2-3V LEFT COMPARISON:  09/29/2021 FINDINGS: The bones appear diffusely osteopenic. No acute fracture or dislocation. Left hip appears intact. Moderate stool burden identified within the colon. IMPRESSION: 1. No acute findings. Electronically Signed   By: Kerby Moors M.D.   On: 10/07/2021 13:55   DG Hip Unilat W or Wo Pelvis 2-3 Views Left  Result Date: 09/29/2021 CLINICAL DATA:  Fall. Tenderness along the left hip with bruising of the left posterior thigh. EXAM: DG HIP (WITH OR WITHOUT PELVIS) 2-3V LEFT COMPARISON:  CT pelvis 09/25/2021 FINDINGS: Bony demineralization. Chronic compression fractures at L4 and L5. No definite hip fracture identified. Dense prostate gland calcifications. There is irregularity of the right inferior pubic ramus suspicious for a fracture. Fracture versus accentuated trabeculation laterally in the right superior pubic ramus. IMPRESSION: 1. The left proximal femur appears intact, there is suspicion for potential fractures of the right (contralateral) inferior pubic ramus and possibly superior pubic ramus. This raises the possibility of other fractures in the bony pelvis which might be occult radiographically partially due to the patient's bony demineralization. Special attention to the bony pelvis is  recommended on the pelvis CT scan ordered for today. 2. Chronic compression fractures at L4 and L5. Electronically Signed   By: Van Clines M.D.   On: 09/29/2021 10:24   CT CHEST ABDOMEN PELVIS WO CONTRAST  Result Date: 09/29/2021 CLINICAL DATA:  Dizziness, fall, initial encounter. EXAM: CT CHEST, ABDOMEN AND PELVIS WITHOUT CONTRAST TECHNIQUE: Multidetector CT imaging of the chest, abdomen and pelvis was performed following the standard protocol without IV contrast. RADIATION DOSE REDUCTION: This exam was performed according to the departmental dose-optimization program which includes automated exposure control, adjustment of the mA and/or kV according to patient size and/or use of iterative reconstruction technique. COMPARISON:  CT abdomen pelvis 09/25/2021 and CT chest 02/15/2021. FINDINGS: CT CHEST FINDINGS Cardiovascular: Atherosclerotic calcification of the aorta, aortic valve and coronary arteries. There may be papillary muscle calcifications in the left ventricle. Decreased attenuation of the intravascular compartment is indicative of anemia. Heart size within normal limits. No pericardial effusion. Mediastinum/Nodes: No pathologically enlarged mediastinal or axillary lymph nodes. Hilar regions are difficult to evaluate without IV contrast. Esophagus is grossly unremarkable. Lungs/Pleura: Centrilobular and paraseptal emphysema. Biapical pleuroparenchymal scarring. Dependent patchy ground-glass, septal thickening and areas of consolidation in the lower lobes, progressive from 09/25/2021. Mild patchy ground-glass in the posterior aspect of the upper lobes appears chronic. No pleural fluid. Adherent debris in the airway. Musculoskeletal: Osteopenia. Vertebral body augmentation. Old bilateral rib fractures. Numerous thoracic compression deformities with kyphosis, as on 02/15/2021. CT ABDOMEN PELVIS FINDINGS Hepatobiliary: Liver and gallbladder are unremarkable. Similar biliary ductal dilatation.  Pancreas: Negative. Spleen: Negative. Adrenals/Urinary Tract: Adrenal glands are unremarkable. 5.9 cm cyst in the lower pole right kidney. Additional low and high attenuation lesions in the kidneys measure up to 1.8 cm and are likely cysts or too small to characterize. No specific follow-up necessary. Ureters are decompressed. Bladder is grossly unremarkable. Stomach/Bowel: Stomach, small bowel, appendix and colon are unremarkable. Vascular/Lymphatic: Atherosclerotic calcification of the aorta. No pathologically enlarged lymph nodes. Reproductive:  Prostate is visualized and contains calcifications. Other: Mild presacral soft tissue thickening, unchanged. No free fluid. Mesenteries and peritoneum are unremarkable. Musculoskeletal: Old right inferior pubic ramus fracture. No acute fracture. Degenerative changes in the spine. L1 vertebral body augmentation. L3, L4 and L5 compression fractures as on 09/25/2021. Large left gluteal hematoma measures approximately 5.0 x 10.2 cm, new from 09/25/2021. Pre-existing inferior left gluteal hematoma (2/121), similar to minimally enlarged. Overlying edema. IMPRESSION: 1. New and enlarging left gluteal hematomas. No underlying acute fracture. 2. Bilateral lower lobe ground-glass, septal thickening and areas of consolidation, worsening in the interval from 09/25/2021, worrisome for progressive pneumonia. 3. Aortic atherosclerosis (ICD10-I70.0). Coronary artery calcifications. 4.  Emphysema (ICD10-J43.9). Electronically Signed   By: Lorin Picket M.D.   On: 09/29/2021 12:13    Orson Eva, DO  [facility]  If 7PM-7AM, please contact night-coverage [email or web address] Password [email or web address] 10/20/2021, 5:19 PM   LOS: 3 days

## 2021-10-20 NOTE — Care Management Important Message (Signed)
Important Message  Patient Details  Name: [name] MRN: [id number] Date of Birth: [birth year]   Medicare Important Message Given:  Yes     [name] 10/20/2021, 11:08 AM

## 2021-10-20 NOTE — TOC Initial Note (Signed)
Transition of Care [facility]) - Initial/Assessment Note    Patient Details  Name: [name] MRN: [id number] Date of Birth: 21-May-1929  Transition of Care [facility] CM/SW Contact:    Salome Arnt, Central Phone Number: 10/20/2021, 8:34 AM  Clinical Narrative:  Pt admitted due to infected right IJ port-a-cath hematoma. Pt is a short-term resident at [facility]. Pt reports plan is to return to [facility] for rehab when medically stable. Per Tami at SNF, okay for return and no FL2 needed. TOC will contine to follow.                Expected Discharge Plan: Skilled Nursing Facility Barriers to Discharge: Continued Medical Work up   Patient Goals and CMS Choice Patient states their goals for this hospitalization and ongoing recovery are:: return to SNF   Choice offered to / list presented to : Patient  Expected Discharge Plan and Services Expected Discharge Plan: [place] In-house Referral: Clinical Social Work   Post Acute Care Choice: [place] Living arrangements for the past 2 months: [place]                                      Prior Living Arrangements/Services Living arrangements for the past 2 months: [place] Lives with:: Facility Resident Patient language and need for interpreter reviewed:: Yes Do you feel safe going back to the place where you live?: Yes        Care giver support system in place?: Yes (comment)      Activities of Daily Living Home Assistive Devices/Equipment: Eyeglasses, Oxygen, Walker (specify type), Cane (specify quad or straight) ADL Screening (condition at time of admission) Patient's cognitive ability adequate to safely complete daily activities?: Yes Is the patient deaf or have difficulty hearing?: No Does the patient have difficulty seeing, even when wearing glasses/contacts?: No Does the patient have difficulty concentrating, remembering, or making decisions?: No Patient able  to express need for assistance with ADLs?: Yes Does the patient have difficulty dressing or bathing?: No Independently performs ADLs?: Yes (appropriate for developmental age) Does the patient have difficulty walking or climbing stairs?: Yes Weakness of Legs: Both Weakness of Arms/Hands: None  Permission Sought/Granted         Permission granted to share info w AGENCY: [facility]  Permission granted to share info w Relationship: SNF     Emotional Assessment     Affect (typically observed): Appropriate Orientation: : Oriented to Place, Oriented to Self, Oriented to  Time, Oriented to Situation Alcohol / Substance Use: Not Applicable Psych Involvement: No (comment)  Admission diagnosis:  SIRS (systemic inflammatory response syndrome) (HCC) [R65.10] Patient Active Problem List   Diagnosis Date Noted   SIRS (systemic inflammatory response syndrome) ([place]) 10/17/2021   Aortic atherosclerosis ([place]) 10/08/2021   Infected hematoma 10/08/2021   Acute blood loss anemia 09/29/2021   Traumatic hematoma of left hip 09/29/2021   Pressure injury of skin 09/25/2021   Fall at home, initial encounter 09/25/2021   Symptomatic anemia 09/20/2021   Chronic hypotension 09/20/2021   Small bowel bleed requiring more than 4 units of blood in 24 hours, ICU, or surgery 09/15/2021   Pulmonary infiltrates on CXR 08/30/2021   Hemorrhage from open wound of left chest wall 08/12/2021   Severe sepsis ([place]) 08/09/2021   AKI (acute kidney injury) ([place]) 08/09/2021   Infected venous access port, initial encounter 08/07/2021  Acute on chronic respiratory failure with hypoxia (HCC) 07/22/2021   Hypokalemia 07/22/2021   [place]    Goals of care, counseling/discussion 07/15/2021   Unspecified protein-calorie malnutrition ([place]) 07/14/2021   Heme positive stool 07/13/2021   Acute on chronic anemia 07/12/2021   Near syncope 06/17/2021   Neoplastic malignant related fatigue 01/21/2021   Depression 12/02/2020   Acquired  keratoderma 12/02/2020   Hematuria 12/02/2020   Osteoporosis 12/02/2020   Low back pain 12/02/2020   Syncope 11/11/2020   COVID-19 virus infection 11/11/2020   COPD Group B  clinically, mild/mod severity  04/03/2020   Acute on chronic blood loss anemia 04/03/2020   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 04/03/2020   Urinary frequency 02/26/2020   Benign prostatic hyperplasia with urinary obstruction 02/26/2020   Myelodysplastic syndrome ([place]) 06/16/2019   HAP (hospital-acquired pneumonia) 06/16/2019   CKD (chronic kidney disease), stage IIIB 06/16/2019   Sepsis ([place]) 06/16/2019   Thrombocytopenia ([place]) 06/06/2019   Intractable back pain 06/06/2019   Chronic kidney insufficiency    Body mass index (BMI) 19.9 or less, adult 04/20/2019   Compression fracture of lumbar vertebra ([place]) 04/20/2019   Elevated blood-pressure reading, without diagnosis of hypertension 04/20/2019   Acute low back pain 03/23/2019   Pulmonary nodule 05/19/2018   Bronchiectasis ([place]) 05/19/2018   Anemia 09/17/2017   Abnormal CT of the abdomen 11/02/2016   Loss of weight 11/02/2016   History of colonic polyps 11/02/2016   GERD (gastroesophageal reflux disease) 10/01/2015   PCP:  Daneil Dolin, MD Pharmacy:   [facility] [address] [place] [address] Phone: [phone] Fax: [place], [place] - [facility] [address] [place] [address] Phone: [phone] Fax: [phone]     Social Determinants of Health (SDOH) Interventions    Readmission Risk Interventions    10/20/2021    8:30 AM 10/03/2021   10:52 AM 10/02/2021   11:51 AM  Readmission Risk Prevention Plan  Transportation Screening Complete  Complete  Medication Review (RN Care Manager) Complete    PCP or Specialist appointment within 3-5  days of discharge  Complete   HRI or Home Care Consult Complete    SW Recovery Care/Counseling Consult Complete  Complete  Palliative Care Screening Not Applicable  Not Applicable  Skilled Nursing Facility Complete  Complete

## 2021-10-20 NOTE — Evaluation (Signed)
Physical Therapy Evaluation Patient Details Name: [name] MRN: [id number] DOB: Mar 11, 1930 Today's Date: 10/20/2021  History of Present Illness  [name] is a 88 year old male with a history of myelodysplastic syndrome, CKD stage IIIb, COPD, GERD, chronic respiratory failure on 2 L, COPD presented to the [facility] for routine visit today.  The patient was seen at the cancer center on 10/14/2021 secondary to hematoma and possible cellulitis around his right IJ Port-A-Cath site.  Blood cultures were drawn on that day and are currently growing methicillin-resistant Staphylococcus epidermidis.  Blood cultures on 10/12/2021 were negative.  The patient was sent back to the [facility] with cephalexin for 7-day course.  He returned to the clinic today with increasing generalized weakness, and nausea and vomiting of 1 day duration.  The patient feels that he was vomiting some blackish type material on 10/16/2021.  He denies any fevers, chills, chest pain, shortness breath, abdominal pain, dysuria, hematuria.  Notably, the patient recently had his current Port-A-Cath placed on 10/02/2021.  His previous Port-A-Cath was removed on 08/07/2021 due to hematoma and possible infection.  He was treated with Augmentin at that time.  The patient noted that he has had progressive pain and swelling about his right-sided Port-A-Cath since 10/10/2021.  When comparing pictures from his previous visit on 10/14/2021, it appears that the patient's edema and hematoma may have progressed.  Notably, the patient has had numerous hospital admissions recently.  Patient was recently admitted to the hospital from 5/11 to 5/13, after he suffered a fall and developed a significant gluteal hematoma resulting in blood loss anemia.  He has been transfused multiple units of PRBCs as well as platelets to keep hemoglobin greater than 8 and platelets greater than 50,000.  He was subsequently discharged to [facility]  for rehab.  In addition, he had an admission from 09/20/2021 to 09/21/2021 for symptomatic anemia.  He was transfused 2 units PRBC.Marland Kitchen  He was admitted from 08/09/2021 to 08/11/2021 secondary to bleeding from his left-sided Port-A-Cath removal site.  He was transfused 2 units PRBC at that time.  He also received 1 unit platelets.   Clinical Impression  Patient demonstrates labored movement for sitting up at bedside having to take rest before attempting sit to stands due to fatigue.  Patient able to take few side steps and steps forward/backwards with Min assist at bedside before having to sit due to fatigue and generalized weakness.  Patient tolerated sitting up in chair after therapy - RN notified.  Patient will benefit from continued skilled physical therapy in hospital and recommended venue below to increase strength, balance, endurance for safe ADLs and gait.       Recommendations for follow up therapy are one component of a multi-disciplinary discharge planning process, led by the attending physician.  Recommendations may be updated based on patient status, additional functional criteria and insurance authorization.  Follow Up Recommendations Skilled nursing-short term rehab (<3 hours/day)    Assistance Recommended at Discharge Set up Supervision/Assistance  Patient can return home with the following  A lot of help with walking and/or transfers;A lot of help with bathing/dressing/bathroom;Assistance with cooking/housework;Help with stairs or ramp for entrance    Equipment Recommendations None recommended by PT  Recommendations for Other Services       Functional Status Assessment Patient has had a recent decline in their functional status and demonstrates the ability to make significant improvements in function in a reasonable and predictable amount of time.  Precautions / Restrictions Precautions Precautions: Fall Restrictions Weight Bearing Restrictions: No      Mobility  Bed  Mobility Overal bed mobility: Needs Assistance Bed Mobility: Supine to Sit     Supine to sit: Supervision, Min guard     General bed mobility comments: slightly labored movement    Transfers Overall transfer level: Needs assistance Equipment used: Rolling walker (2 wheels) Transfers: Sit to/from Stand, Bed to chair/wheelchair/BSC Sit to Stand: Min guard, Min assist   Step pivot transfers: Min guard, Min assist       General transfer comment: labored movement, increased time    Ambulation/Gait Ambulation/Gait assistance: Min assist Gait Distance (Feet): 15 Feet Assistive device: Rolling walker (2 wheels) Gait Pattern/deviations: Decreased step length - right, Decreased step length - left, Decreased stance time - left, Decreased stride length Gait velocity: decreased     General Gait Details: limited to a few side steps and steps forward/backwards at bedside before having to sit due to c/o fatigue  Stairs            Wheelchair Mobility    Modified Rankin (Stroke Patients Only)       Balance Overall balance assessment: Needs assistance Sitting-balance support: Feet supported, No upper extremity supported Sitting balance-Leahy Scale: Fair Sitting balance - Comments: fair/good seated at EOB   Standing balance support: During functional activity, Bilateral upper extremity supported Standing balance-Leahy Scale: Fair Standing balance comment: using RW                             Pertinent Vitals/Pain Pain Assessment Pain Assessment: No/denies pain    Home Living Family/patient expects to be discharged to:: Private residence Living Arrangements: Alone Available Help at Discharge: Family;Available PRN/intermittently Type of Home: Mobile home Home Access: Ramped entrance       Home Layout: One level Home Equipment: Conservation officer, nature (2 wheels);Cane - single point;Electric scooter;BSC/3in1;Shower seat - built in      Prior Function Prior Level  of Function : Independent/Modified Independent             Mobility Comments: household ambulator using RW, uses SPC for community, drives ADLs Comments: Independent     Hand Dominance   Dominant Hand: Right    Extremity/Trunk Assessment   Upper Extremity Assessment Upper Extremity Assessment: Generalized weakness    Lower Extremity Assessment Lower Extremity Assessment: Generalized weakness    Cervical / Trunk Assessment Cervical / Trunk Assessment: Normal  Communication   Communication: No difficulties  Cognition Arousal/Alertness: Awake/alert Behavior During Therapy: WFL for tasks assessed/performed Overall Cognitive Status: Within Functional Limits for tasks assessed                                          General Comments      Exercises     Assessment/Plan    PT Assessment Patient needs continued PT services  PT Problem List Decreased strength;Decreased activity tolerance;Decreased balance;Decreased mobility       PT Treatment Interventions DME instruction;Gait training;Stair training;Functional mobility training;Therapeutic activities;Therapeutic exercise;Balance training;Patient/family education    PT Goals (Current goals can be found in the Care Plan section)  Acute Rehab PT Goals Patient Stated Goal: return home after rehab PT Goal Formulation: With patient Time For Goal Achievement: 11/03/21 Potential to Achieve Goals: Good    Frequency Min 3X/week  Co-evaluation               AM-PAC PT "6 Clicks" Mobility  Outcome Measure Help needed turning from your back to your side while in a flat bed without using bedrails?: A Little Help needed moving from lying on your back to sitting on the side of a flat bed without using bedrails?: A Little Help needed moving to and from a bed to a chair (including a wheelchair)?: A Little Help needed standing up from a chair using your arms (e.g., wheelchair or bedside chair)?: A  Little Help needed to walk in hospital room?: A Little Help needed climbing 3-5 steps with a railing? : A Lot 6 Click Score: 17    End of Session Equipment Utilized During Treatment: Oxygen Activity Tolerance: Patient tolerated treatment well;Patient limited by fatigue Patient left: in chair;with call bell/phone within reach Nurse Communication: Mobility status PT Visit Diagnosis: Unsteadiness on feet (R26.81);Other abnormalities of gait and mobility (R26.89);Muscle weakness (generalized) (M62.81)    Time: 1610-9604 PT Time Calculation (min) (ACUTE ONLY): 20 min   Charges:   PT Evaluation $PT Eval Moderate Complexity: 1 Mod PT Treatments $Therapeutic Activity: 8-22 mins        11:36 AM, 10/20/21 [name], MPT Physical Therapist with [facility] 336 [phone] office [phone] mobile phone

## 2021-10-20 NOTE — Progress Notes (Signed)
Pharmacy Antibiotic Note  [name] is a 88 y.o. male admitted on 10/17/2021 with Port-A-Cath hematoma and concern for infection. PAC excised today and catheter tip culture sent. Pharmacy has been consulted for Vancomycin dosing.   Plan: Continue Vancomycin 750 mg IV Q 48 hrs. Goal AUC 400-550. Follow renal function, culture data, clinical progress, and antibiotic plans.  Height: '6\' 4"'$  (193 cm) Weight: 55.1 kg (121 lb 7.6 oz) IBW/kg (Calculated) : 86.8  Temp (24hrs), Avg:98.8 F (37.1 C), Min:98.8 F (37.1 C), Max:98.8 F (37.1 C)  Recent Labs  Lab 10/16/21 0530 10/17/21 0922 10/17/21 1433 10/17/21 1603 10/18/21 0229 10/19/21 1451 10/20/21 0501  WBC 23.7* 38.5*  --   --  44.8* 40.4* 36.7*  CREATININE  --   --  2.20*  --  2.19*  --  1.86*  LATICACIDVEN  --   --  1.0 1.1  --   --   --      Estimated Creatinine Clearance: 20.6 mL/min (A) (by C-G formula based on SCr of 1.86 mg/dL (H)).    No Known Allergies  Antimicrobials this admission:  Cefepime 6/2 >>6/4 Vancomycin 6/2 >>   Microbiology results:  6/2 blood: ngtd  6/2 catheter tip: rare staph epidermis  6/2 MRSA PCR: negative - 10/14/21 BCID: staph epi in single set of blood cultures.   10/14/21 BCID: MRSE in single set of blood cultures.  10/12/21 blood cultures with no growth x 4 days to date.  Thank you for allowing pharmacy to be a part of this patient's care.  Margot Ables, PharmD Clinical Pharmacist 10/20/2021 10:46 AM

## 2021-10-20 NOTE — Plan of Care (Signed)

## 2021-10-21 DIAGNOSIS — R6510 Systemic inflammatory response syndrome (SIRS) of non-infectious origin without acute organ dysfunction: Secondary | ICD-10-CM | POA: Diagnosis not present

## 2021-10-21 LAB — CBC
HCT: 27.2 % — ABNORMAL LOW (ref 39.0–52.0)
Hemoglobin: 8.9 g/dL — ABNORMAL LOW (ref 13.0–17.0)
MCH: 28.2 pg (ref 26.0–34.0)
MCHC: 32.7 g/dL (ref 30.0–36.0)
MCV: 86.1 fL (ref 80.0–100.0)
Platelets: 43 10*3/uL — ABNORMAL LOW (ref 150–400)
RBC: 3.16 MIL/uL — ABNORMAL LOW (ref 4.22–5.81)
RDW: 19.2 % — ABNORMAL HIGH (ref 11.5–15.5)
WBC: 37.9 10*3/uL — ABNORMAL HIGH (ref 4.0–10.5)
nRBC: 0.6 % — ABNORMAL HIGH (ref 0.0–0.2)

## 2021-10-21 LAB — BPAM PLATELET PHERESIS
Blood Product Expiration Date: 202306082359
ISSUE DATE / TIME: 202306052351
Unit Type and Rh: 1700

## 2021-10-21 LAB — HEMOGLOBIN AND HEMATOCRIT, BLOOD
HCT: 27.2 % — ABNORMAL LOW (ref 39.0–52.0)
Hemoglobin: 8.9 g/dL — ABNORMAL LOW (ref 13.0–17.0)

## 2021-10-21 LAB — CULTURE, BLOOD (ROUTINE X 2): Special Requests: ADEQUATE

## 2021-10-21 LAB — SURGICAL PATHOLOGY

## 2021-10-21 LAB — PREPARE PLATELET PHERESIS: Unit division: 0

## 2021-10-21 NOTE — Progress Notes (Addendum)
PROGRESS NOTE  [name] QQP:[id number] DOB: [birth year] DOA: 10/17/2021 PCP: Daneil Dolin, MD  Brief History:  88 year old male with a history of myelodysplastic syndrome, CKD stage IIIb, COPD, GERD, chronic respiratory failure on 2 L, COPD, chronic hypotension on midodrine presented to the [facility] for routine visit today.  The patient was seen at the cancer center on 10/14/2021 secondary to hematoma and possible cellulitis around his right IJ Port-A-Cath site.  Blood cultures were drawn on that day and are currently growing methicillin-resistant Staphylococcus epidermidis.  Blood cultures on 10/12/2021 were negative.  The patient was sent back to the [facility] with cephalexin for 7-day course.  He returned to the clinic today with increasing generalized weakness, and nausea and vomiting of 1 day duration.  The patient feels that he was vomiting some blackish type material on 10/16/2021.  He denies any fevers, chills, chest pain, shortness breath, abdominal pain, dysuria, hematuria.  Notably, the patient recently had his current Port-A-Cath placed on 10/02/2021.  His previous Port-A-Cath was removed on 08/07/2021 due to hematoma and possible infection.  He was treated with Augmentin at that time. The patient noted that he has had progressive pain and swelling about his right-sided Port-A-Cath since 10/10/2021.  When comparing pictures from his previous visit on 10/14/2021, it appears that the patient's edema and hematoma may have progressed. Notably, the patient has had numerous hospital admissions recently.  Patient was recently admitted to the hospital from 5/11 to 5/13, after he suffered a fall and developed a significant gluteal hematoma resulting in blood loss anemia.  He has been transfused multiple units of PRBCs as well as platelets to keep hemoglobin greater than 8 and platelets greater than 50,000.  He was subsequently discharged to [facility] for  rehab. In addition, he had an admission from 09/20/2021 to 09/21/2021 for symptomatic anemia.  He was transfused 2 units PRBC.Marland Kitchen  He was admitted from 08/09/2021 to 08/11/2021 secondary to bleeding from his left-sided Port-A-Cath removal site.  He was transfused 2 units PRBC at that time.  He also received 1 unit platelets.   Assessment/Plan:  Infected Right IJ Port-A-Cath hematoma -cultures = MRSE -General surgery consulted; discussed with Dr. Constance Haw --Port-A-Cath removal---done on 10/17/21 -Repeat blood cultures x2 sets--neg to date -follow up cultures from removed port-a-cath = MRSE -continue vancomycin -plan IV vanc at least through 10/24/21, then switch to po abx    MRSE Bacteremia 10/14/21 blood culture = MRSE --called microbiology to work up sensitivity of 5/30 blood culture --source = port-a-cath (removed 10/17/21) Follow 10/17/21 blood culture--neg to date -plan IV vanc at least through 10/24/21,   Coffee-ground emesis -Start pantoprazole IV twice daily>>continue 3/8 EGD--Localized hemorrhagic mucosa with active bleeding and with stigmata of bleeding was found in the proximal jejunum. Upon lavage it looked as a vascularized area, ?possible Dieulafoy vs AVM.  Hemoclip placed -Consult GI appreciated>>continue PPI bid, treat conservatively   Acute on chronic anemia Baseline hemoglobin with wide variation, but usually ~8 range -Transfused 2 units PRBC 10/18/21 -transfuse one unit platelets 6/5   Myelodysplastic syndrome (HCC) Follows Dr. Delton Coombes -follow up with Dr. Delton Coombes states treatment of MDS on hold due to hospitalizations for syncope Previously on Azacitidine 09/16/20 to 04/21/21 --WBC continues to climb>>add diff to CBC --concerned about leukemic conversion --CBC with diff in am --6/5 discussed with Dr. Delton Coombes   Thrombocytopenia [facility]) Has been chronic due to MDS,  30-40K range  CKD (chronic kidney disease), stage IIIB Baseline creatinine 1.5-1.8 Serial BMPs   COPD  (chronic obstructive pulmonary disease) (HCC) Baseline 3 L oxygen requirement Continue albuterol as needed Stable   Chronic hypotension -Continue on midodrine   Fall with left gluteal hematoma -currently at [facility] for rehab Left hip hematoma appears to be involuting   Goals of Care 6/5--long discussion with patient's daughter Stanton [facility] --may be beyond window of taking patient to [place] --She is coming back to [place] in next 1-2 days  --pt expressed he is ready for hospice at home --waiting for daughter to return here from [place] for home hospice to be set up --plan d/c home when [place] returns --6/6 total time 50 min.   Subjective: Patient denies fevers, chills, headache, chest pain, dyspnea, nausea, vomiting, diarrhea, abdominal pain, dysuria, hematuria, hematochezia, and melena.   Objective: Vitals:   10/21/21 0259 10/21/21 0719 10/21/21 1200 10/21/21 1509  BP: (!) 108/55  107/61 (!) 106/58  Pulse: 73  78 81  Resp: '17  16 18  '$ Temp: 97.9 F (36.6 C)  97.7 F (36.5 C) 97.6 F (36.4 C)  TempSrc: Oral  Oral Oral  SpO2: 95% 95% 96% 96%  Weight:      Height:        Intake/Output Summary (Last 24 hours) at 10/21/2021 1858 Last data filed at 10/21/2021 0255 Gross per 24 hour  Intake 472 ml  Output 450 ml  Net 22 ml   Weight change:  Exam:  General:  Pt is alert, follows commands appropriately, not in acute distress HEENT: No icterus, No thrush, No neck mass, [place]/AT Cardiovascular: RRR, S1/S2, no rubs, no gallops Respiratory: diminished BS.  Bibasilar rales Abdomen: Soft/+BS, non tender, non distended, no guarding Extremities: No edema, No lymphangitis, No petechiae, No rashes, no synovitis   Data Reviewed: I have personally reviewed following labs and imaging studies Basic Metabolic Panel: Recent Labs  Lab 10/17/21 1433 10/18/21 0229 10/20/21 0501  NA 135 135 134*  K 4.3 3.9 3.1*  CL 108 111 110  CO2 22 21* 19*  GLUCOSE 102* 87 91  BUN 42* 42* 33*   CREATININE 2.20* 2.19* 1.86*  CALCIUM 9.1 8.3* 8.9  MG  --  1.8  --    Liver Function Tests: No results for input(s): AST, ALT, ALKPHOS, BILITOT, PROT, ALBUMIN in the last 168 hours. No results for input(s): LIPASE, AMYLASE in the last 168 hours. No results for input(s): AMMONIA in the last 168 hours. Coagulation Profile: No results for input(s): INR, PROTIME in the last 168 hours. CBC: Recent Labs  Lab 10/16/21 0530 10/17/21 0922 10/18/21 0038 10/18/21 0229 10/18/21 1410 10/19/21 1451 10/20/21 0501 10/21/21 0504  WBC 23.7* 38.5*  --  44.8*  --  40.4* 36.7* 37.9*  NEUTROABS 10.5*  --   --   --   --   --  23.5*  --   HGB 7.6* 7.8*   < > 6.3* 10.0* 10.3* 9.5* 8.9*  8.9*  HCT 24.1* 25.1*   < > 20.6* 31.6* 31.2* 28.9* 27.2*  27.2*  MCV 83.4 84.5  --  84.4  --  85.5 85.5 86.1  PLT 38* 44*  --  62*  --  35* 31* 43*   < > = values in this interval not displayed.   Cardiac Enzymes: No results for input(s): CKTOTAL, CKMB, CKMBINDEX, TROPONINI in the last 168 hours. BNP: Invalid input(s): POCBNP CBG: No results for input(s): GLUCAP in the last 168 hours. HbA1C:  No results for input(s): HGBA1C in the last 72 hours. Urine analysis:    Component Value Date/Time   COLORURINE YELLOW 10/17/2021 1351   APPEARANCEUR HAZY (A) 10/17/2021 1351   APPEARANCEUR Hazy (A) 03/26/2021 1334   LABSPEC 1.015 10/17/2021 1351   PHURINE 5.0 10/17/2021 1351   GLUCOSEU NEGATIVE 10/17/2021 1351   HGBUR MODERATE (A) 10/17/2021 1351   BILIRUBINUR NEGATIVE 10/17/2021 1351   BILIRUBINUR Negative 03/26/2021 1334   KETONESUR NEGATIVE 10/17/2021 1351   PROTEINUR 100 (A) 10/17/2021 1351   UROBILINOGEN 0.2 08/15/2013 1240   NITRITE NEGATIVE 10/17/2021 1351   LEUKOCYTESUR NEGATIVE 10/17/2021 1351   Sepsis Labs: '@[email or web address]'$ (procalcitonin:4,lacticidven:4) ) Recent Results (from the past 240 hour(s))  Blood Culture (routine x 2)     Status: None   Collection Time: 10/12/21 11:19 AM   Specimen: BLOOD  RIGHT FOREARM  Result Value Ref Range Status   Specimen Description   Final    BLOOD RIGHT FOREARM BOTTLES DRAWN AEROBIC AND ANAEROBIC   Special Requests Blood Culture adequate volume  Final   Culture   Final    NO GROWTH 6 DAYS Performed at [facility], [address] [place], [place] [address]    Report Status 10/18/2021 FINAL  Final  Blood Culture (routine x 2)     Status: None   Collection Time: 10/12/21 11:27 AM   Specimen: BLOOD LEFT FOREARM  Result Value Ref Range Status   Specimen Description   Final    BLOOD LEFT FOREARM BOTTLES DRAWN AEROBIC AND ANAEROBIC   Special Requests Blood Culture adequate volume  Final   Culture   Final    NO GROWTH 6 DAYS Performed at [facility] [address] [place], [place] [address]    Report Status 10/18/2021 FINAL  Final  Culture, blood (routine x 2)     Status: Abnormal   Collection Time: 10/14/21  3:12 PM   Specimen: Left Antecubital; Blood  Result Value Ref Range Status   Specimen Description   Final    LEFT ANTECUBITAL Performed at [facility], [address], [place], [place] [address]    Special Requests   Final    BOTTLES DRAWN AEROBIC AND ANAEROBIC Blood Culture adequate volume Performed at [facility], [address], [place], [place] [address]    Culture  Setup Time   Final    GRAM POSITIVE COCCI Gram Stain Report Called to,Read Back By and Verified With: T MYERS [id number] K FORSYTH AEROBIC BOTTLE ONLY CRITICAL RESULT CALLED TO, READ BACK BY AND VERIFIED WITH: RN Bonney Leitz [phone] '@2034'$  FH Performed at [facility], [place] [address], [place], [place] [address]    Culture STAPHYLOCOCCUS EPIDERMIDIS (A)  Final   Report Status 10/21/2021 FINAL  Final   Organism ID, Bacteria STAPHYLOCOCCUS EPIDERMIDIS  Final      Susceptibility   Staphylococcus epidermidis - MIC*    CIPROFLOXACIN >=8 RESISTANT Resistant     ERYTHROMYCIN >=8 RESISTANT Resistant     GENTAMICIN <=0.5 SENSITIVE Sensitive     OXACILLIN >=4 RESISTANT  Resistant     TETRACYCLINE 2 SENSITIVE Sensitive     VANCOMYCIN <=0.5 SENSITIVE Sensitive     TRIMETH/SULFA 80 RESISTANT Resistant     CLINDAMYCIN >=8 RESISTANT Resistant     RIFAMPIN <=0.5 SENSITIVE Sensitive     Inducible Clindamycin NEGATIVE Sensitive     * STAPHYLOCOCCUS EPIDERMIDIS  Blood Culture ID Panel (Reflexed)     Status: Abnormal   Collection Time: 10/14/21  3:12 PM  Result Value Ref Range Status   Enterococcus faecalis NOT DETECTED NOT DETECTED Final   Enterococcus Faecium NOT DETECTED NOT DETECTED Final   Listeria monocytogenes NOT DETECTED NOT DETECTED Final   Staphylococcus species DETECTED (A) NOT DETECTED Final    Comment: CRITICAL RESULT CALLED TO, READ BACK BY AND VERIFIED WITH: RN Ladell Pier [phone] '@2034'$  FH    Staphylococcus aureus (BCID) NOT DETECTED NOT DETECTED Final   Staphylococcus epidermidis DETECTED (A) NOT DETECTED Final    Comment: Methicillin (oxacillin) resistant coagulase negative staphylococcus. Possible blood culture contaminant (unless isolated from more than one blood culture draw or clinical case suggests pathogenicity). No antibiotic treatment is indicated for blood  culture contaminants. CRITICAL RESULT CALLED TO, READ BACK BY AND VERIFIED WITH: RN Bonney Leitz [phone] '@2034'$  FH    Staphylococcus lugdunensis NOT DETECTED NOT DETECTED Final   Streptococcus species NOT DETECTED NOT DETECTED Final   Streptococcus agalactiae NOT DETECTED NOT DETECTED Final   Streptococcus pneumoniae NOT DETECTED NOT DETECTED Final   Streptococcus pyogenes NOT DETECTED NOT DETECTED Final   A.calcoaceticus-baumannii NOT DETECTED NOT DETECTED Final   Bacteroides fragilis NOT DETECTED NOT DETECTED Final   Enterobacterales NOT DETECTED NOT DETECTED Final   Enterobacter cloacae complex NOT DETECTED NOT DETECTED Final   Escherichia coli NOT DETECTED NOT DETECTED Final   Klebsiella aerogenes NOT DETECTED NOT DETECTED Final   Klebsiella oxytoca NOT DETECTED NOT DETECTED Final    Klebsiella pneumoniae NOT DETECTED NOT DETECTED Final   Proteus species NOT DETECTED NOT DETECTED Final   Salmonella species NOT DETECTED NOT DETECTED Final   Serratia marcescens NOT DETECTED NOT DETECTED Final   Haemophilus influenzae NOT DETECTED NOT DETECTED Final   Neisseria meningitidis NOT DETECTED NOT DETECTED Final   Pseudomonas aeruginosa NOT DETECTED NOT DETECTED Final   Stenotrophomonas maltophilia NOT DETECTED NOT DETECTED Final   Candida albicans NOT DETECTED NOT DETECTED Final   Candida auris NOT DETECTED NOT DETECTED Final   Candida glabrata NOT DETECTED NOT DETECTED Final   Candida krusei NOT DETECTED NOT DETECTED Final   Candida parapsilosis NOT DETECTED NOT DETECTED Final   Candida tropicalis NOT DETECTED NOT DETECTED Final   Cryptococcus neoformans/gattii NOT DETECTED NOT DETECTED Final   Methicillin resistance mecA/C DETECTED (A) NOT DETECTED Final    Comment: CRITICAL RESULT CALLED TO, READ BACK BY AND VERIFIED WITH: RN Ladell Pier [phone] '@2034'$  FH Performed at [facility], [address] [place], [place] [address]   Aerobic/Anaerobic Culture w Gram Stain (surgical/deep wound)     Status: None (Preliminary result)   Collection Time: 10/17/21 11:57 AM   Specimen: Catheter Tip  Result Value Ref Range Status   Specimen Description   Final    CATH TIP Performed at [facility], [address] [place], [place] [address]    Special Requests   Final    Immunocompromised Performed at [facility], [address] [place], [place] [address]    Gram Stain   Final    NO WBC SEEN NO ORGANISMS SEEN Performed at [facility], [place] [address] [place], [place] [address]    Culture   Final    RARE STAPHYLOCOCCUS EPIDERMIDIS NO ANAEROBES ISOLATED; CULTURE IN PROGRESS FOR 5 DAYS    Report Status PENDING  Incomplete   Organism ID, Bacteria STAPHYLOCOCCUS EPIDERMIDIS  Final      Susceptibility   Staphylococcus epidermidis - MIC*    CIPROFLOXACIN >=8 RESISTANT  Resistant     ERYTHROMYCIN >=8 RESISTANT Resistant  GENTAMICIN <=0.5 SENSITIVE Sensitive     OXACILLIN >=4 RESISTANT Resistant     TETRACYCLINE 2 SENSITIVE Sensitive     VANCOMYCIN 1 SENSITIVE Sensitive     TRIMETH/SULFA 80 RESISTANT Resistant     CLINDAMYCIN >=8 RESISTANT Resistant     RIFAMPIN <=0.5 SENSITIVE Sensitive     Inducible Clindamycin NEGATIVE Sensitive     * RARE STAPHYLOCOCCUS EPIDERMIDIS  Aerobic/Anaerobic Culture w Gram Stain (surgical/deep wound)     Status: None (Preliminary result)   Collection Time: 10/17/21 11:58 AM   Specimen: Wound  Result Value Ref Range Status   Specimen Description   Final    WOUND HEMATOMA Performed at [facility], [address], [place], [place] [address]    Special Requests   Final    Immunocompromised Performed at [facility], [address] [place], [place] [address]    Gram Stain   Final    ABUNDANT WBC PRESENT, PREDOMINANTLY PMN NO ORGANISMS SEEN Performed at [facility], [place] [address], [place], [place] [address]    Culture   Final    FEW STAPHYLOCOCCUS EPIDERMIDIS NO ANAEROBES ISOLATED; CULTURE IN PROGRESS FOR 5 DAYS    Report Status PENDING  Incomplete   Organism ID, Bacteria STAPHYLOCOCCUS EPIDERMIDIS  Final      Susceptibility   Staphylococcus epidermidis - MIC*    CIPROFLOXACIN >=8 RESISTANT Resistant     ERYTHROMYCIN >=8 RESISTANT Resistant     GENTAMICIN <=0.5 SENSITIVE Sensitive     OXACILLIN >=4 RESISTANT Resistant     TETRACYCLINE 2 SENSITIVE Sensitive     VANCOMYCIN 1 SENSITIVE Sensitive     TRIMETH/SULFA 80 RESISTANT Resistant     CLINDAMYCIN >=8 RESISTANT Resistant     RIFAMPIN <=0.5 SENSITIVE Sensitive     Inducible Clindamycin NEGATIVE Sensitive     * FEW STAPHYLOCOCCUS EPIDERMIDIS  MRSA Next Gen by PCR, Nasal     Status: None   Collection Time: 10/17/21 12:11 PM   Specimen: Nasal Mucosa; Nasal Swab  Result Value Ref Range Status   MRSA by PCR Next Gen NOT DETECTED NOT DETECTED Final     Comment: (NOTE) The GeneXpert MRSA Assay (FDA approved for NASAL specimens only), is one component of a comprehensive MRSA colonization surveillance program. It is not intended to diagnose MRSA infection nor to guide or monitor treatment for MRSA infections. Test performance is not FDA approved in patients less than 12 years old. Performed at [facility], [address], [place], [place] [address]   Culture, blood (Routine X 2) w Reflex to ID Panel     Status: None (Preliminary result)   Collection Time: 10/17/21  2:33 PM   Specimen: BLOOD LEFT HAND  Result Value Ref Range Status   Specimen Description BLOOD LEFT HAND  Final   Special Requests   Final    BOTTLES DRAWN AEROBIC AND ANAEROBIC Blood Culture adequate volume   Culture   Final    NO GROWTH 4 DAYS Performed at [facility], [address], [place],  [address]    Report Status PENDING  Incomplete  Culture, blood (Routine X 2) w Reflex to ID Panel     Status: None (Preliminary result)   Collection Time: 10/17/21  2:33 PM   Specimen: BLOOD RIGHT HAND  Result Value Ref Range Status   Specimen Description BLOOD RIGHT HAND  Final   Special Requests   Final    BOTTLES DRAWN AEROBIC AND ANAEROBIC Blood Culture adequate volume   Culture  Final    NO GROWTH 4 DAYS Performed at [facility], [address] [place], [place] [address]    Report Status PENDING  Incomplete     Scheduled Meds:  sodium chloride  250 mL Intravenous Once   sodium chloride   Intravenous Once   acetaminophen  650 mg Oral Once   Chlorhexidine Gluconate Cloth  6 each Topical Q0600   diphenhydrAMINE  25 mg Oral Once   mouth rinse  15 mL Mouth Rinse BID   midodrine  5 mg Oral TID WC   pantoprazole (PROTONIX) IV  40 mg Intravenous Q12H   predniSONE  5 mg Oral Q breakfast   umeclidinium-vilanterol  1 puff Inhalation Daily   Continuous Infusions:  vancomycin Stopped (10/19/21 2212)    Procedures/Studies: CT ABDOMEN PELVIS WO CONTRAST  Result  Date: 09/25/2021 CLINICAL DATA:  Weakness and anemia.  Fall on left side 09/22/2021 EXAM: CT ABDOMEN AND PELVIS WITHOUT CONTRAST TECHNIQUE: Multidetector CT imaging of the abdomen and pelvis was performed following the standard protocol without IV contrast. RADIATION DOSE REDUCTION: This exam was performed according to the departmental dose-optimization program which includes automated exposure control, adjustment of the mA and/or kV according to patient size and/or use of iterative reconstruction technique. COMPARISON:  CT 09/23/2016 FINDINGS: Lower chest: Basilar emphysema. Patchy consolidation within the left greater than right lower lobes suspicious for pneumonia, including aspiration. Trace left pleural effusion. Hepatobiliary: There is no evidence of focal hepatic abnormality on this unenhanced exam. Gallbladder physiologically distended, no calcified stone. No biliary dilatation. Pancreas: No ductal dilatation or inflammation. Normal for age pancreatic atrophy. Spleen: The spleen measures 12.1 x 7.7 x 9.8 cm (volume = 480 cm^3), mildly enlarged. No focal abnormality or perisplenic fluid on this unenhanced exam. Adrenals/Urinary Tract: No adrenal nodule. There is no hydronephrosis. There is left renal parenchymal thinning. Multiple bilateral cysts of varying sizes in density, incompletely characterized on this unenhanced exam. These are grossly unchanged from 2018 exam, and no specific imaging follow-up is recommended. Unremarkable urinary bladder. Stomach/Bowel: There is ingested material distending the stomach. Occasional fluid-filled small bowel without obstruction or inflammation. Moderate volume of colonic stool. No colonic wall thickening or inflammatory change. Left colonic diverticulosis, prominent in the sigmoid, no diverticulitis. Vascular/Lymphatic: Aortic atherosclerosis. No aortic aneurysm. There is no periaortic stranding. No retroperitoneal fluid. No bulky abdominopelvic adenopathy.  Reproductive: Prostatic calcifications. Other: There is soft tissue edema involving the greater than right flank subcutaneous tissues dependently, as well as midline skin thickening. Edematous changes extend over the left hip where there is also enlargement of the left gluteus muscles as described below. There is no free air or free fluid within the abdomen or pelvis. The right testis may be located in the inguinal canal. Musculoskeletal: Enlargement and heterogeneous left gluteal musculature may be related to hematoma in the setting of recent fall. There is overlying stranding of the left lateral hip and flank soft tissues. No evidence of retroperitoneal hematoma. The bones are diffusely under mineralized. Allowing for this limitation, no acute fracture of the pelvis, hip, or included ribs. There are chronic compression fractures of T11 and L2 with vertebral augmentation. Chronic compression deformity of L3, L4, and L5, not significantly changed from 06/05/2019 lumbar radiographs. IMPRESSION: 1. Enlargement and heterogeneous left gluteal musculature may be related to hematoma in the setting of recent fall. There is overlying stranding of the left lateral hip and flank soft tissues. No pelvis or hip fracture. 2. No evidence of retroperitoneal hematoma. 3. Patchy consolidation  within the left greater than right lower lobes suspicious for pneumonia, including aspiration. Trace left pleural effusion. 4. Colonic diverticulosis without diverticulitis. 5. Mild splenomegaly. 6. Multiple chronic compression fractures of the lower thoracic and lumbar spine. Aortic Atherosclerosis (ICD10-I70.0) and Emphysema (ICD10-J43.9). Electronically Signed   By: Keith Rake M.D.   On: 09/25/2021 19:17   CT Head Wo Contrast  Result Date: 09/29/2021 CLINICAL DATA:  Head trauma, minor (Age >= 65y) EXAM: CT HEAD WITHOUT CONTRAST TECHNIQUE: Contiguous axial images were obtained from the base of the skull through the vertex without  intravenous contrast. RADIATION DOSE REDUCTION: This exam was performed according to the departmental dose-optimization program which includes automated exposure control, adjustment of the mA and/or kV according to patient size and/or use of iterative reconstruction technique. COMPARISON:  June 2022 FINDINGS: Brain: There is no acute intracranial hemorrhage, mass effect, or edema. Gray-white differentiation is preserved. There is no extra-axial fluid collection. Prominence of the ventricles and sulci reflects similar parenchymal volume loss. Patchy hypoattenuation in the supratentorial white matter is nonspecific but probably reflects similar chronic microvascular ischemic changes. Vascular: There is atherosclerotic calcification at the skull base. Skull: Calvarium is unremarkable. Sinuses/Orbits: No acute finding. Other: None. IMPRESSION: No evidence of acute intracranial injury. Electronically Signed   By: Macy Mis M.D.   On: 09/29/2021 12:04   CT Cervical Spine Wo Contrast  Result Date: 09/29/2021 CLINICAL DATA:  Neck trauma (Age >= 65y) EXAM: CT CERVICAL SPINE WITHOUT CONTRAST TECHNIQUE: Multidetector CT imaging of the cervical spine was performed without intravenous contrast. Multiplanar CT image reconstructions were also generated. RADIATION DOSE REDUCTION: This exam was performed according to the departmental dose-optimization program which includes automated exposure control, adjustment of the mA and/or kV according to patient size and/or use of iterative reconstruction technique. COMPARISON:  None Available. FINDINGS: Alignment: Trace retrolisthesis at C4-C5. Skull base and vertebrae: Decreased osseous mineralization. Degenerative endplate irregularity. No acute fracture. Soft tissues and spinal canal: No prevertebral fluid or swelling. No visible canal hematoma. Disc levels: Multilevel degenerative changes are present including disc space narrowing, endplate osteophytes, and facet and  uncovertebral hypertrophy. Upper chest: Dictated separately. Other: Mild calcified plaque at the common carotid bifurcations. IMPRESSION: No acute cervical spine fracture. Electronically Signed   By: Macy Mis M.D.   On: 09/29/2021 11:59   CT CHEST HIGH RESOLUTION  Result Date: 10/12/2021 CLINICAL DATA:  Pulmonary infiltrates, history of myelodysplastic syndrome EXAM: CT CHEST WITHOUT CONTRAST TECHNIQUE: Multidetector CT imaging of the chest was performed following the standard protocol without intravenous contrast. High resolution imaging of the lungs, as well as inspiratory and expiratory imaging, was performed. RADIATION DOSE REDUCTION: This exam was performed according to the departmental dose-optimization program which includes automated exposure control, adjustment of the mA and/or kV according to patient size and/or use of iterative reconstruction technique. COMPARISON:  Chest 09/29/2021 FINDINGS: Cardiovascular: Right chest port catheter. Aortic atherosclerosis. Normal heart size. Three-vessel coronary artery calcifications. No pericardial effusion. Mediastinum/Nodes: No enlarged mediastinal, hilar, or axillary lymph nodes. Thyroid gland, trachea, and esophagus demonstrate no significant findings. Lungs/Pleura: Moderate centrilobular and paraseptal emphysema. Improved irregular and heterogeneous ground-glass airspace opacity throughout the lung bases, with associated bronchiolar plugging. No significant air trapping on expiratory phase imaging. No pleural effusion or pneumothorax. Upper Abdomen: No acute abnormality. Musculoskeletal: No chest wall abnormality. No suspicious osseous lesions identified. Multiple wedge deformities throughout the thoracic spine, unchanged, status post vertebral cement augmentation of T11. IMPRESSION: 1. Improved irregular and heterogeneous ground-glass airspace opacity throughout the  lung bases, with associated bronchiolar plugging. Findings are consistent with improved  infection or aspiration. 2. There may be some degree of underlying scarring and or fibrosis of the lung bases, however this is difficult to assess in the setting of superimposed acute airspace disease. There are no specific features to distinguish the possibility of fibrotic interstitial lung disease from bland scarring. 3. Emphysema. 4. Coronary artery disease. Aortic Atherosclerosis (ICD10-I70.0) and Emphysema (ICD10-J43.9). Electronically Signed   By: Delanna Ahmadi M.D.   On: 10/12/2021 12:40   CT T-SPINE NO CHARGE  Result Date: 09/29/2021 CLINICAL DATA:  Fall EXAM: CT THORACIC AND LUMBAR SPINE WITHOUT CONTRAST TECHNIQUE: Multidetector CT imaging of the thoracic and lumbar spine was performed without contrast. Multiplanar CT image reconstructions were also generated. RADIATION DOSE REDUCTION: This exam was performed according to the departmental dose-optimization program which includes automated exposure control, adjustment of the mA and/or kV according to patient size and/or use of iterative reconstruction technique. COMPARISON:  CT abdomen pelvis 09/25/2021, CT chest 02/15/2021, CT L-spine 06/02/2019 FINDINGS: CT THORACIC SPINE FINDINGS Alignment: Mild S shaped curvature. Exaggeration of the normal thoracic kyphosis in part secondary to chronic compression deformities of T6-T8). No listhesis. Vertebrae: No acute fracture or suspicious osseous lesion. Unchanged compression deformities of T6, T7, T8, T9, and T11, with redemonstrated kyphoplasty in T11. Paraspinal and other soft tissues: Please see same-day CT chest abdomen pelvis. Disc levels: No high-grade spinal canal stenosis. Moderate bilateral neural foraminal narrowing at T10-T11 and T11-T12. CT LUMBAR SPINE FINDINGS Segmentation: 5 lumbar type vertebral bodies. Partial sacralization of L5 with bilateral L5-S1 pseudoarticulation of broadened left-greater-than-right L5 transverse processes with the sacral ala. Alignment: Mild S shaped curvature of the  thoracolumbar spine. Straightening and mild reversal of the normal lumbar lordosis. No significant listhesis. Vertebrae: No acute fracture or suspicious osseous lesion. Redemonstrated compression deformities at L2, L3, L4, and L5, with redemonstrated kyphoplasty at L2. Paraspinal and other soft tissues: Please see same-day CT chest abdomen pelvis. Disc levels: Redemonstrated degenerative changes, with mild spinal canal stenosis at L2-L3 and L3-L4. No significant neural foraminal narrowing. IMPRESSION: No acute fracture or traumatic listhesis in the thoracic spine or lumbar spine. Unchanged multilevel compression deformities, status post kyphoplasty at T11 and T12. Please see same-day CT chest abdomen pelvis for soft tissue findings. Electronically Signed   By: Merilyn Baba M.D.   On: 09/29/2021 12:09   CT L-SPINE NO CHARGE  Result Date: 09/29/2021 CLINICAL DATA:  Fall EXAM: CT THORACIC AND LUMBAR SPINE WITHOUT CONTRAST TECHNIQUE: Multidetector CT imaging of the thoracic and lumbar spine was performed without contrast. Multiplanar CT image reconstructions were also generated. RADIATION DOSE REDUCTION: This exam was performed according to the departmental dose-optimization program which includes automated exposure control, adjustment of the mA and/or kV according to patient size and/or use of iterative reconstruction technique. COMPARISON:  CT abdomen pelvis 09/25/2021, CT chest 02/15/2021, CT L-spine 06/02/2019 FINDINGS: CT THORACIC SPINE FINDINGS Alignment: Mild S shaped curvature. Exaggeration of the normal thoracic kyphosis in part secondary to chronic compression deformities of T6-T8). No listhesis. Vertebrae: No acute fracture or suspicious osseous lesion. Unchanged compression deformities of T6, T7, T8, T9, and T11, with redemonstrated kyphoplasty in T11. Paraspinal and other soft tissues: Please see same-day CT chest abdomen pelvis. Disc levels: No high-grade spinal canal stenosis. Moderate bilateral  neural foraminal narrowing at T10-T11 and T11-T12. CT LUMBAR SPINE FINDINGS Segmentation: 5 lumbar type vertebral bodies. Partial sacralization of L5 with bilateral L5-S1 pseudoarticulation of broadened left-greater-than-right L5 transverse processes  with the sacral ala. Alignment: Mild S shaped curvature of the thoracolumbar spine. Straightening and mild reversal of the normal lumbar lordosis. No significant listhesis. Vertebrae: No acute fracture or suspicious osseous lesion. Redemonstrated compression deformities at L2, L3, L4, and L5, with redemonstrated kyphoplasty at L2. Paraspinal and other soft tissues: Please see same-day CT chest abdomen pelvis. Disc levels: Redemonstrated degenerative changes, with mild spinal canal stenosis at L2-L3 and L3-L4. No significant neural foraminal narrowing. IMPRESSION: No acute fracture or traumatic listhesis in the thoracic spine or lumbar spine. Unchanged multilevel compression deformities, status post kyphoplasty at T11 and T12. Please see same-day CT chest abdomen pelvis for soft tissue findings. Electronically Signed   By: Merilyn Baba M.D.   On: 09/29/2021 12:09   DG Chest Port 1 View  Result Date: 10/17/2021 CLINICAL DATA:  Removal of right IJ chest port EXAM: PORTABLE CHEST 1 VIEW COMPARISON:  Previous studies including the examination of 10/12/2021 FINDINGS: There is interval removal of right IJ chest port. There is no pleural effusion or pneumothorax. Severe emphysematous changes are noted. Increased interstitial markings are seen in both lower lung fields and right upper lung fields. There are no new infiltrates or signs of pulmonary edema. There is no significant pleural effusion or pneumothorax. There is previous vertebroplasty and compression fractures in the thoracic and upper lumbar vertebrae. IMPRESSION: COPD. Linear densities in the lower lung fields suggest scarring. There are no new infiltrates or signs of pulmonary edema. Interval removal of right IJ  chest port. Electronically Signed   By: Elmer Picker M.D.   On: 10/17/2021 15:16   DG Chest Port 1 View  Result Date: 10/12/2021 CLINICAL DATA:  Short of breath since last night. Infection at the Port-A-Cath site. History of COPD. History of bone carcinoma. EXAM: PORTABLE CHEST 1 VIEW COMPARISON:  10/02/2021 and older studies. High-resolution chest CT, 10/10/2021. FINDINGS: Cardiac silhouette is normal in size. No mediastinal or hilar masses. Right anterior chest wall, internal jugular, Port-A-Cath is stable. Lungs show diffuse irregular interstitial thickening consistent with fibrosis with underlying emphysema, stable. No lung consolidation. No convincing pulmonary edema. No pleural effusion or pneumothorax. No acute skeletal abnormality. Previous vertebral fractures treated with vertebroplasty. Skeletal structures are diffusely demineralized. IMPRESSION: 1. No acute cardiopulmonary disease. 2. Chronic lung findings Electronically Signed   By: Lajean Manes M.D.   On: 10/12/2021 11:54   DG Chest Port 1 View  Result Date: 10/02/2021 CLINICAL DATA:  Postop port placement EXAM: PORTABLE CHEST 1 VIEW COMPARISON:  Chest radiograph 09/25/2021 FINDINGS: There is a new right chest wall port in place with tip terminating in the mid SVC. The cardiomediastinal silhouette is stable. There is extensive coarsened interstitial markings throughout both lungs. There is a new small left pleural effusion with adjacent atelectasis. There is no other new or worsening focal airspace disease. There is no significant right pleural effusion. There is no pneumothorax The bones are stable. IMPRESSION: 1. New right chest wall port in place with the tip terminating in the mid SVC. 2. New small left pleural effusion with adjacent atelectasis or pneumonia. Electronically Signed   By: Valetta Mole M.D.   On: 10/02/2021 11:44   DG Chest Port 1 View  Result Date: 09/25/2021 CLINICAL DATA:  Shortness of breath EXAM: PORTABLE CHEST  1 VIEW COMPARISON:  Chest x-ray dated Sep 20, 2021 FINDINGS: Cardiac and mediastinal contours are unchanged. Bilateral reticular opacities, most prominent in the lung bases, similar prior exams and likely due to background emphysema and  scarring. No new focal consolidation. Previously described right lung opacity is no longer visible. No large pleural effusion or pneumothorax. IMPRESSION: No new focal consolidation. Previously described right lung opacity is no longer visible, likely resolved atelectasis. Electronically Signed   By: Yetta Glassman M.D.   On: 09/25/2021 12:11   DG C-Arm 1-60 Min-No Report  Result Date: 10/02/2021 Fluoroscopy was utilized by the requesting physician.  No radiographic interpretation.   DG HIP UNILAT WITH PELVIS 2-3 VIEWS LEFT  Result Date: 10/07/2021 CLINICAL DATA:  Pain in left hip. EXAM: DG HIP (WITH OR WITHOUT PELVIS) 2-3V LEFT COMPARISON:  09/29/2021 FINDINGS: The bones appear diffusely osteopenic. No acute fracture or dislocation. Left hip appears intact. Moderate stool burden identified within the colon. IMPRESSION: 1. No acute findings. Electronically Signed   By: Kerby Moors M.D.   On: 10/07/2021 13:55   DG Hip Unilat W or Wo Pelvis 2-3 Views Left  Result Date: 09/29/2021 CLINICAL DATA:  Fall. Tenderness along the left hip with bruising of the left posterior thigh. EXAM: DG HIP (WITH OR WITHOUT PELVIS) 2-3V LEFT COMPARISON:  CT pelvis 09/25/2021 FINDINGS: Bony demineralization. Chronic compression fractures at L4 and L5. No definite hip fracture identified. Dense prostate gland calcifications. There is irregularity of the right inferior pubic ramus suspicious for a fracture. Fracture versus accentuated trabeculation laterally in the right superior pubic ramus. IMPRESSION: 1. The left proximal femur appears intact, there is suspicion for potential fractures of the right (contralateral) inferior pubic ramus and possibly superior pubic ramus. This raises the  possibility of other fractures in the bony pelvis which might be occult radiographically partially due to the patient's bony demineralization. Special attention to the bony pelvis is recommended on the pelvis CT scan ordered for today. 2. Chronic compression fractures at L4 and L5. Electronically Signed   By: Van Clines M.D.   On: 09/29/2021 10:24   CT CHEST ABDOMEN PELVIS WO CONTRAST  Result Date: 09/29/2021 CLINICAL DATA:  Dizziness, fall, initial encounter. EXAM: CT CHEST, ABDOMEN AND PELVIS WITHOUT CONTRAST TECHNIQUE: Multidetector CT imaging of the chest, abdomen and pelvis was performed following the standard protocol without IV contrast. RADIATION DOSE REDUCTION: This exam was performed according to the departmental dose-optimization program which includes automated exposure control, adjustment of the mA and/or kV according to patient size and/or use of iterative reconstruction technique. COMPARISON:  CT abdomen pelvis 09/25/2021 and CT chest 02/15/2021. FINDINGS: CT CHEST FINDINGS Cardiovascular: Atherosclerotic calcification of the aorta, aortic valve and coronary arteries. There may be papillary muscle calcifications in the left ventricle. Decreased attenuation of the intravascular compartment is indicative of anemia. Heart size within normal limits. No pericardial effusion. Mediastinum/Nodes: No pathologically enlarged mediastinal or axillary lymph nodes. Hilar regions are difficult to evaluate without IV contrast. Esophagus is grossly unremarkable. Lungs/Pleura: Centrilobular and paraseptal emphysema. Biapical pleuroparenchymal scarring. Dependent patchy ground-glass, septal thickening and areas of consolidation in the lower lobes, progressive from 09/25/2021. Mild patchy ground-glass in the posterior aspect of the upper lobes appears chronic. No pleural fluid. Adherent debris in the airway. Musculoskeletal: Osteopenia. Vertebral body augmentation. Old bilateral rib fractures. Numerous  thoracic compression deformities with kyphosis, as on 02/15/2021. CT ABDOMEN PELVIS FINDINGS Hepatobiliary: Liver and gallbladder are unremarkable. Similar biliary ductal dilatation. Pancreas: Negative. Spleen: Negative. Adrenals/Urinary Tract: Adrenal glands are unremarkable. 5.9 cm cyst in the lower pole right kidney. Additional low and high attenuation lesions in the kidneys measure up to 1.8 cm and are likely cysts or too small to characterize. No  specific follow-up necessary. Ureters are decompressed. Bladder is grossly unremarkable. Stomach/Bowel: Stomach, small bowel, appendix and colon are unremarkable. Vascular/Lymphatic: Atherosclerotic calcification of the aorta. No pathologically enlarged lymph nodes. Reproductive: Prostate is visualized and contains calcifications. Other: Mild presacral soft tissue thickening, unchanged. No free fluid. Mesenteries and peritoneum are unremarkable. Musculoskeletal: Old right inferior pubic ramus fracture. No acute fracture. Degenerative changes in the spine. L1 vertebral body augmentation. L3, L4 and L5 compression fractures as on 09/25/2021. Large left gluteal hematoma measures approximately 5.0 x 10.2 cm, new from 09/25/2021. Pre-existing inferior left gluteal hematoma (2/121), similar to minimally enlarged. Overlying edema. IMPRESSION: 1. New and enlarging left gluteal hematomas. No underlying acute fracture. 2. Bilateral lower lobe ground-glass, septal thickening and areas of consolidation, worsening in the interval from 09/25/2021, worrisome for progressive pneumonia. 3. Aortic atherosclerosis (ICD10-I70.0). Coronary artery calcifications. 4.  Emphysema (ICD10-J43.9). Electronically Signed   By: Lorin Picket M.D.   On: 09/29/2021 12:13    Orson Eva, DO  [facility]  If 7PM-7AM, please contact night-coverage [email or web address] Password [email or web address] 10/21/2021, 6:58 PM   LOS: 4 days

## 2021-10-21 NOTE — Progress Notes (Signed)
Pt complains of " not feeling good and being tired". Vitals WNL and are in chart. PT is more lethargic than this morning,  MD notified.

## 2021-10-21 NOTE — Progress Notes (Signed)
[name]  Wound check and changed by me today.  Saturated packing in place, removed with saline soaking as I pulled out   Old hematoma in the wound   Packed lightly with saline soaked iodoform gauze    Patient did well. Pressure dressing placed.  He is saying he wants to go home and be more comfortable and not do aggressive care. Dr. Carles Collet and daughters working on this. The wound may continue to bleed but removing the gauze with saline and going slowly and applying a pressure dressing with help. Some of the bleeding is from old hematoma.   If he goes home, I told him I can see him in a few weeks to look at the wound if he wants. His last wound took over 1 month to heal.   Will get office to make appt for like 3 weeks from now. If he decides to cancel this that is fine.   Thomas Labrum, MD [facility] [address] [place], [place] [address] [phone] (office)

## 2021-10-22 DIAGNOSIS — D62 Acute posthemorrhagic anemia: Secondary | ICD-10-CM | POA: Diagnosis not present

## 2021-10-22 DIAGNOSIS — D469 Myelodysplastic syndrome, unspecified: Secondary | ICD-10-CM | POA: Diagnosis not present

## 2021-10-22 DIAGNOSIS — N1832 Chronic kidney disease, stage 3b: Secondary | ICD-10-CM | POA: Diagnosis not present

## 2021-10-22 DIAGNOSIS — R6510 Systemic inflammatory response syndrome (SIRS) of non-infectious origin without acute organ dysfunction: Secondary | ICD-10-CM | POA: Diagnosis not present

## 2021-10-22 LAB — CULTURE, BLOOD (ROUTINE X 2)
Culture: NO GROWTH
Culture: NO GROWTH
Special Requests: ADEQUATE
Special Requests: ADEQUATE

## 2021-10-22 LAB — AEROBIC/ANAEROBIC CULTURE W GRAM STAIN (SURGICAL/DEEP WOUND): Gram Stain: NONE SEEN

## 2021-10-22 NOTE — TOC Progression Note (Signed)
Transition of Care [facility]) - Progression Note    Patient Details  Name: ABDULAI BLAYLOCK MRN: [id number] Date of Birth: 02/03/1930  Transition of [facility]) CM/SW Contact  Boneta Lucks, RN Phone Number: 10/22/2021, 10:31 AM  Clinical Narrative:   Daughter is traveling here today. She does not want to make a decision about home with hospice VS residential hospice until to talks with her dad face to face. Team updated. DC plan for Friday.  Expected Discharge Plan: [place] Barriers to Discharge: Continued Medical Work up  Expected Discharge Plan and Services Expected Discharge Plan: [place] In-house Referral: Clinical Social Work   Post Acute Care Choice: [place] Living arrangements for the past 2 months: [place]                   Readmission Risk Interventions    10/20/2021    8:30 AM 10/03/2021   10:52 AM 10/02/2021   11:51 AM  Readmission Risk Prevention Plan  Transportation Screening Complete  Complete  Medication Review (RN Care Manager) Complete    PCP or Specialist appointment within 3-5 days of discharge  Complete   HRI or Home Care Consult Complete    SW Recovery Care/Counseling Consult Complete  Complete  Palliative Care Screening Not Applicable  Not Applicable  Skilled Nursing Facility Complete  Complete

## 2021-10-22 NOTE — Progress Notes (Signed)
PROGRESS NOTE  [name] DZH:[id number] DOB: April 17, 1930 DOA: 10/17/2021 PCP: Daneil Dolin, MD  Brief History:  88 year old male with a history of myelodysplastic syndrome, CKD stage IIIb, COPD, GERD, chronic respiratory failure on 2 L, COPD, chronic hypotension on midodrine presented to the [place] for routine visit today.  The patient was seen at the cancer center on 10/14/2021 secondary to hematoma and possible cellulitis around his right IJ Port-A-Cath site.  Blood cultures were drawn on that day and are currently growing methicillin-resistant Staphylococcus epidermidis.  Blood cultures on 10/12/2021 were negative.  The patient was sent back to the [facility] with cephalexin for 7-day course.  He returned to the clinic today with increasing generalized weakness, and nausea and vomiting of 1 day duration.  The patient feels that he was vomiting some blackish type material on 10/16/2021.  He denies any fevers, chills, chest pain, shortness breath, abdominal pain, dysuria, hematuria.  Notably, the patient recently had his current Port-A-Cath placed on 10/02/2021.  His previous Port-A-Cath was removed on 08/07/2021 due to hematoma and possible infection.  He was treated with Augmentin at that time. The patient noted that he has had progressive pain and swelling about his right-sided Port-A-Cath since 10/10/2021.  When comparing pictures from his previous visit on 10/14/2021, it appears that the patient's edema and hematoma may have progressed. Notably, the patient has had numerous hospital admissions recently.  Patient was recently admitted to the hospital from 5/11 to 5/13, after he suffered a fall and developed a significant gluteal hematoma resulting in blood loss anemia.  He has been transfused multiple units of PRBCs as well as platelets to keep hemoglobin greater than 8 and platelets greater than 50,000.  He was subsequently discharged to [facility] for  rehab. In addition, he had an admission from 09/20/2021 to 09/21/2021 for symptomatic anemia.  He was transfused 2 units PRBC.Marland Kitchen  He was admitted from 08/09/2021 to 08/11/2021 secondary to bleeding from his left-sided Port-A-Cath removal site.  He was transfused 2 units PRBC at that time.  He also received 1 unit platelets.   Assessment/Plan:  Infected Right IJ Port-A-Cath hematoma -cultures = MRSE -General surgery consulted; discussed with Dr. Constance Haw --Port-A-Cath removal---done on 10/17/21 -Repeat blood cultures x2 sets--neg to date -follow up cultures from removed port-a-cath = MRSE -continue vancomycin -plan IV vanc through 10/23/21, then switch to po abx    MRSE Bacteremia 10/14/21 blood culture = MRSE --called microbiology to work up sensitivity of 5/30 blood culture --source = port-a-cath (removed 10/17/21) Follow 10/17/21 blood culture--neg to date -plan IV vanc through 10/23/21   Coffee-ground emesis -Start pantoprazole IV twice daily>>continue 3/8 EGD--Localized hemorrhagic mucosa with active bleeding and with stigmata of bleeding was found in the proximal jejunum. Upon lavage it looked as a vascularized area, ?possible Dieulafoy vs AVM.  Hemoclip placed -Consult GI appreciated>>continue PPI bid, treat conservatively   Acute on chronic anemia Baseline hemoglobin with wide variation, but usually ~8 range -Transfused 2 units PRBC 10/18/21 -transfused one unit platelets 6/5   Myelodysplastic syndrome (HCC) Follows Dr. Delton Coombes -follow up with Dr. Delton Coombes states treatment of MDS on hold due to hospitalizations for syncope Previously on Azacitidine 09/16/20 to 04/21/21 --WBC continues to climb>>add diff to CBC --concerned about leukemic conversion --CBC with diff in am --6/5 care discussed with Dr. Delton Coombes   Thrombocytopenia [facility]) Has been chronic due to MDS,  30-40K range   CKD (  chronic [name] disease), stage IIIB Baseline creatinine 1.5-1.8 Serial BMPs   COPD (chronic  obstructive pulmonary disease) Baseline 3 L oxygen requirement Continue albuterol as needed Stable   Chronic hypotension -Continue on midodrine   Fall with left gluteal hematoma -currently at [facility] for rehab Left hip hematoma appears to be involuting   Goals of Care 6/5--long discussion with patient's daughter [name] --may be beyond window of taking patient to [place] --She is coming back to [place] in next 1-2 days  --pt expressed he is ready for hospice at home --waiting for daughter to return here from [place] for home hospice to be set up --plan d/c home when [place] returns --6/6 total time 50 min.   Subjective: No specific complaints,  he is agreeable to home with hospice.   Objective: Vitals:   10/21/21 2206 10/22/21 0319 10/22/21 0808 10/22/21 1228  BP: 104/63 110/62  111/65  Pulse: 80 78  87  Resp: '18 18  17  '$ Temp: 98.8 F (37.1 C) 97.6 F (36.4 C)  98.8 F (37.1 C)  TempSrc: Oral Oral    SpO2: 90% 96% 95% 94%  Weight:      Height:        Intake/Output Summary (Last 24 hours) at 10/22/2021 1624 Last data filed at 10/22/2021 1300 Gross per 24 hour  Intake 120 ml  Output 550 ml  Net -430 ml   Weight change:  Exam:  General:  he appears comfortable lying in bed, NAD.  Cooperative.   HEENT: supple neck, no thyroid nodules or masses palpated.  Cardiovascular: normal s1, s2 sounds.  No MRG Respiratory:  mild increased work of breathing.  Abdomen: soft, NDNT, no HSM.  Extremities: no CCE.    Data Reviewed: I have personally reviewed following labs and imaging studies Basic Metabolic Panel: Recent Labs  Lab 10/17/21 1433 10/18/21 0229 10/20/21 0501  NA 135 135 134*  K 4.3 3.9 3.1*  CL 108 111 110  CO2 22 21* 19*  GLUCOSE 102* 87 91  BUN 42* 42* 33*  CREATININE 2.20* 2.19* 1.86*  CALCIUM 9.1 8.3* 8.9  MG  --  1.8  --    Liver Function Tests: No results for input(s): AST, ALT, ALKPHOS, BILITOT, PROT, ALBUMIN in the last 168 hours. No results  for input(s): LIPASE, AMYLASE in the last 168 hours. No results for input(s): AMMONIA in the last 168 hours. Coagulation Profile: No results for input(s): INR, PROTIME in the last 168 hours. CBC: Recent Labs  Lab 10/16/21 0530 10/17/21 0922 10/18/21 0038 10/18/21 0229 10/18/21 1410 10/19/21 1451 10/20/21 0501 10/21/21 0504  WBC 23.7* 38.5*  --  44.8*  --  40.4* 36.7* 37.9*  NEUTROABS 10.5*  --   --   --   --   --  23.5*  --   HGB 7.6* 7.8*   < > 6.3* 10.0* 10.3* 9.5* 8.9*  8.9*  HCT 24.1* 25.1*   < > 20.6* 31.6* 31.2* 28.9* 27.2*  27.2*  MCV 83.4 84.5  --  84.4  --  85.5 85.5 86.1  PLT 38* 44*  --  62*  --  35* 31* 43*   < > = values in this interval not displayed.   Cardiac Enzymes: No results for input(s): CKTOTAL, CKMB, CKMBINDEX, TROPONINI in the last 168 hours. BNP: Invalid input(s): POCBNP CBG: No results for input(s): GLUCAP in the last 168 hours. HbA1C: No results for input(s): HGBA1C in the last 72 hours. Urine analysis:  Component Value Date/Time   COLORURINE YELLOW 10/17/2021 1351   APPEARANCEUR HAZY (A) 10/17/2021 1351   APPEARANCEUR Hazy (A) 03/26/2021 1334   LABSPEC 1.015 10/17/2021 1351   PHURINE 5.0 10/17/2021 1351   GLUCOSEU NEGATIVE 10/17/2021 1351   HGBUR MODERATE (A) 10/17/2021 1351   BILIRUBINUR NEGATIVE 10/17/2021 1351   BILIRUBINUR Negative 03/26/2021 1334   KETONESUR NEGATIVE 10/17/2021 1351   PROTEINUR 100 (A) 10/17/2021 1351   UROBILINOGEN 0.2 08/15/2013 1240   NITRITE NEGATIVE 10/17/2021 1351   LEUKOCYTESUR NEGATIVE 10/17/2021 1351   Recent Results (from the past 240 hour(s))  Culture, blood (routine x 2)     Status: Abnormal   Collection Time: 10/14/21  3:12 PM   Specimen: Left Antecubital; Blood  Result Value Ref Range Status   Specimen Description   Final    LEFT ANTECUBITAL Performed at [facility], [address], [place], [place] [address]    Special Requests   Final    BOTTLES DRAWN AEROBIC AND ANAEROBIC Blood Culture  adequate volume Performed at [facility], [address] [place], [place] [address]    Culture  Setup Time   Final    GRAM POSITIVE COCCI Gram Stain Report Called to,Read Back By and Verified With: T MYERS [id number] K FORSYTH AEROBIC BOTTLE ONLY CRITICAL RESULT CALLED TO, READ BACK BY AND VERIFIED WITH: RN Bonney Leitz [phone] '@2034'$  FH Performed at [facility], [place] [address] [place], [place] [address]    Culture STAPHYLOCOCCUS EPIDERMIDIS (A)  Final   Report Status 10/21/2021 FINAL  Final   Organism ID, Bacteria STAPHYLOCOCCUS EPIDERMIDIS  Final      Susceptibility   Staphylococcus epidermidis - MIC*    CIPROFLOXACIN >=8 RESISTANT Resistant     ERYTHROMYCIN >=8 RESISTANT Resistant     GENTAMICIN <=0.5 SENSITIVE Sensitive     OXACILLIN >=4 RESISTANT Resistant     TETRACYCLINE 2 SENSITIVE Sensitive     VANCOMYCIN <=0.5 SENSITIVE Sensitive     TRIMETH/SULFA 80 RESISTANT Resistant     CLINDAMYCIN >=8 RESISTANT Resistant     RIFAMPIN <=0.5 SENSITIVE Sensitive     Inducible Clindamycin NEGATIVE Sensitive     * STAPHYLOCOCCUS EPIDERMIDIS  Blood Culture ID Panel (Reflexed)     Status: Abnormal   Collection Time: 10/14/21  3:12 PM  Result Value Ref Range Status   Enterococcus faecalis NOT DETECTED NOT DETECTED Final   Enterococcus Faecium NOT DETECTED NOT DETECTED Final   Listeria monocytogenes NOT DETECTED NOT DETECTED Final   Staphylococcus species DETECTED (A) NOT DETECTED Final    Comment: CRITICAL RESULT CALLED TO, READ BACK BY AND VERIFIED WITH: RN Ladell Pier [phone] '@2034'$  FH    Staphylococcus aureus (BCID) NOT DETECTED NOT DETECTED Final   Staphylococcus epidermidis DETECTED (A) NOT DETECTED Final    Comment: Methicillin (oxacillin) resistant coagulase negative staphylococcus. Possible blood culture contaminant (unless isolated from more than one blood culture draw or clinical case suggests pathogenicity). No antibiotic treatment is indicated for blood  culture  contaminants. CRITICAL RESULT CALLED TO, READ BACK BY AND VERIFIED WITH: RN Bonney Leitz [phone] '@2034'$  FH    Staphylococcus lugdunensis NOT DETECTED NOT DETECTED Final   Streptococcus species NOT DETECTED NOT DETECTED Final   Streptococcus agalactiae NOT DETECTED NOT DETECTED Final   Streptococcus pneumoniae NOT DETECTED NOT DETECTED Final   Streptococcus pyogenes NOT DETECTED NOT DETECTED Final   A.calcoaceticus-baumannii NOT DETECTED NOT DETECTED Final   Bacteroides fragilis NOT DETECTED NOT DETECTED Final   Enterobacterales NOT DETECTED  NOT DETECTED Final   Enterobacter cloacae complex NOT DETECTED NOT DETECTED Final   Escherichia coli NOT DETECTED NOT DETECTED Final   Klebsiella aerogenes NOT DETECTED NOT DETECTED Final   Klebsiella oxytoca NOT DETECTED NOT DETECTED Final   Klebsiella pneumoniae NOT DETECTED NOT DETECTED Final   Proteus species NOT DETECTED NOT DETECTED Final   Salmonella species NOT DETECTED NOT DETECTED Final   Serratia marcescens NOT DETECTED NOT DETECTED Final   Haemophilus influenzae NOT DETECTED NOT DETECTED Final   Neisseria meningitidis NOT DETECTED NOT DETECTED Final   Pseudomonas aeruginosa NOT DETECTED NOT DETECTED Final   Stenotrophomonas maltophilia NOT DETECTED NOT DETECTED Final   Candida albicans NOT DETECTED NOT DETECTED Final   Candida auris NOT DETECTED NOT DETECTED Final   Candida glabrata NOT DETECTED NOT DETECTED Final   Candida krusei NOT DETECTED NOT DETECTED Final   Candida parapsilosis NOT DETECTED NOT DETECTED Final   Candida tropicalis NOT DETECTED NOT DETECTED Final   Cryptococcus neoformans/gattii NOT DETECTED NOT DETECTED Final   Methicillin resistance mecA/C DETECTED (A) NOT DETECTED Final    Comment: CRITICAL RESULT CALLED TO, READ BACK BY AND VERIFIED WITH: RN Ladell Pier [phone] '@2034'$  FH Performed at [facility], [address], [place], [place] [address]   Aerobic/Anaerobic Culture w Gram Stain (surgical/deep wound)      Status: None   Collection Time: 10/17/21 11:57 AM   Specimen: Catheter Tip  Result Value Ref Range Status   Specimen Description   Final    CATH TIP Performed at [facility], [address], [place], [place] [address]    Special Requests   Final    Immunocompromised Performed at [facility], [address] [place], [place] [address]    Gram Stain NO WBC SEEN NO ORGANISMS SEEN   Final   Culture   Final    RARE STAPHYLOCOCCUS EPIDERMIDIS NO ANAEROBES ISOLATED Performed at [facility], [place] [address] [place], [place] [address]    Report Status 10/22/2021 FINAL  Final   Organism ID, Bacteria STAPHYLOCOCCUS EPIDERMIDIS  Final      Susceptibility   Staphylococcus epidermidis - MIC*    CIPROFLOXACIN >=8 RESISTANT Resistant     ERYTHROMYCIN >=8 RESISTANT Resistant     GENTAMICIN <=0.5 SENSITIVE Sensitive     OXACILLIN >=4 RESISTANT Resistant     TETRACYCLINE 2 SENSITIVE Sensitive     VANCOMYCIN 1 SENSITIVE Sensitive     TRIMETH/SULFA 80 RESISTANT Resistant     CLINDAMYCIN >=8 RESISTANT Resistant     RIFAMPIN <=0.5 SENSITIVE Sensitive     Inducible Clindamycin NEGATIVE Sensitive     * RARE STAPHYLOCOCCUS EPIDERMIDIS  Aerobic/Anaerobic Culture w Gram Stain (surgical/deep wound)     Status: None   Collection Time: 10/17/21 11:58 AM   Specimen: Wound  Result Value Ref Range Status   Specimen Description   Final    WOUND HEMATOMA Performed at [facility], [address] [place], [place] [address]    Special Requests   Final    Immunocompromised Performed at [facility], [address] [place],  [address]    Gram Stain   Final    ABUNDANT WBC PRESENT, PREDOMINANTLY PMN NO ORGANISMS SEEN    Culture   Final    FEW STAPHYLOCOCCUS EPIDERMIDIS NO ANAEROBES ISOLATED Performed at [facility], [place] [address] [place],  [address]    Report Status 10/22/2021 FINAL  Final   Organism ID, Bacteria STAPHYLOCOCCUS EPIDERMIDIS  Final  Susceptibility    Staphylococcus epidermidis - MIC*    CIPROFLOXACIN >=8 RESISTANT Resistant     ERYTHROMYCIN >=8 RESISTANT Resistant     GENTAMICIN <=0.5 SENSITIVE Sensitive     OXACILLIN >=4 RESISTANT Resistant     TETRACYCLINE 2 SENSITIVE Sensitive     VANCOMYCIN 1 SENSITIVE Sensitive     TRIMETH/SULFA 80 RESISTANT Resistant     CLINDAMYCIN >=8 RESISTANT Resistant     RIFAMPIN <=0.5 SENSITIVE Sensitive     Inducible Clindamycin NEGATIVE Sensitive     * FEW STAPHYLOCOCCUS EPIDERMIDIS  MRSA Next Gen by PCR, Nasal     Status: None   Collection Time: 10/17/21 12:11 PM   Specimen: Nasal Mucosa; Nasal Swab  Result Value Ref Range Status   MRSA by PCR Next Gen NOT DETECTED NOT DETECTED Final    Comment: (NOTE) The GeneXpert MRSA Assay (FDA approved for NASAL specimens only), is one component of a comprehensive MRSA colonization surveillance program. It is not intended to diagnose MRSA infection nor to guide or monitor treatment for MRSA infections. Test performance is not FDA approved in patients less than 67 years old. Performed at [facility], [address], [place], [place] [address]   Culture, blood (Routine X 2) w Reflex to ID Panel     Status: None   Collection Time: 10/17/21  2:33 PM   Specimen: BLOOD LEFT HAND  Result Value Ref Range Status   Specimen Description BLOOD LEFT HAND  Final   Special Requests   Final    BOTTLES DRAWN AEROBIC AND ANAEROBIC Blood Culture adequate volume   Culture   Final    NO GROWTH 5 DAYS Performed at [facility], [address] [place], [place] [address]    Report Status 10/22/2021 FINAL  Final  Culture, blood (Routine X 2) w Reflex to ID Panel     Status: None   Collection Time: 10/17/21  2:33 PM   Specimen: BLOOD RIGHT HAND  Result Value Ref Range Status   Specimen Description BLOOD RIGHT HAND  Final   Special Requests   Final    BOTTLES DRAWN AEROBIC AND ANAEROBIC Blood Culture adequate volume   Culture   Final    NO GROWTH 5 DAYS Performed at [facility], [address] [place], [place] [address]    Report Status 10/22/2021 FINAL  Final     Scheduled Meds:  sodium chloride  250 mL Intravenous Once   sodium chloride   Intravenous Once   acetaminophen  650 mg Oral Once   Chlorhexidine Gluconate Cloth  6 each Topical Q0600   diphenhydrAMINE  25 mg Oral Once   mouth rinse  15 mL Mouth Rinse BID   midodrine  5 mg Oral TID WC   pantoprazole (PROTONIX) IV  40 mg Intravenous Q12H   predniSONE  5 mg Oral Q breakfast   umeclidinium-vilanterol  1 puff Inhalation Daily   Continuous Infusions:  vancomycin 750 mg (10/21/21 2312)    Procedures/Studies: CT ABDOMEN PELVIS WO CONTRAST  Result Date: 09/25/2021 CLINICAL DATA:  Weakness and anemia.  Fall on left side 09/22/2021 EXAM: CT ABDOMEN AND PELVIS WITHOUT CONTRAST TECHNIQUE: Multidetector CT imaging of the abdomen and pelvis was performed following the standard protocol without IV contrast. RADIATION DOSE REDUCTION: This exam was performed according to the departmental dose-optimization program which includes automated exposure control, adjustment of the mA and/or kV according to patient size and/or use of iterative reconstruction technique. COMPARISON:  CT 09/23/2016 FINDINGS: Lower chest: Basilar  emphysema. Patchy consolidation within the left greater than right lower lobes suspicious for pneumonia, including aspiration. Trace left pleural effusion. Hepatobiliary: There is no evidence of focal hepatic abnormality on this unenhanced exam. Gallbladder physiologically distended, no calcified stone. No biliary dilatation. Pancreas: No ductal dilatation or inflammation. Normal for age pancreatic atrophy. Spleen: The spleen measures 12.1 x 7.7 x 9.8 cm (volume = 480 cm^3), mildly enlarged. No focal abnormality or perisplenic fluid on this unenhanced exam. Adrenals/Urinary Tract: No adrenal nodule. There is no hydronephrosis. There is left renal parenchymal thinning. Multiple bilateral cysts of varying  sizes in density, incompletely characterized on this unenhanced exam. These are grossly unchanged from 2018 exam, and no specific imaging follow-up is recommended. Unremarkable urinary bladder. Stomach/Bowel: There is ingested material distending the stomach. Occasional fluid-filled small bowel without obstruction or inflammation. Moderate volume of colonic stool. No colonic wall thickening or inflammatory change. Left colonic diverticulosis, prominent in the sigmoid, no diverticulitis. Vascular/Lymphatic: Aortic atherosclerosis. No aortic aneurysm. There is no periaortic stranding. No retroperitoneal fluid. No bulky abdominopelvic adenopathy. Reproductive: Prostatic calcifications. Other: There is soft tissue edema involving the greater than right flank subcutaneous tissues dependently, as well as midline skin thickening. Edematous changes extend over the left hip where there is also enlargement of the left gluteus muscles as described below. There is no free air or free fluid within the abdomen or pelvis. The right testis may be located in the inguinal canal. Musculoskeletal: Enlargement and heterogeneous left gluteal musculature may be related to hematoma in the setting of recent fall. There is overlying stranding of the left lateral hip and flank soft tissues. No evidence of retroperitoneal hematoma. The bones are diffusely under mineralized. Allowing for this limitation, no acute fracture of the pelvis, hip, or included ribs. There are chronic compression fractures of T11 and L2 with vertebral augmentation. Chronic compression deformity of L3, L4, and L5, not significantly changed from 06/05/2019 lumbar radiographs. IMPRESSION: 1. Enlargement and heterogeneous left gluteal musculature may be related to hematoma in the setting of recent fall. There is overlying stranding of the left lateral hip and flank soft tissues. No pelvis or hip fracture. 2. No evidence of retroperitoneal hematoma. 3. Patchy consolidation  within the left greater than right lower lobes suspicious for pneumonia, including aspiration. Trace left pleural effusion. 4. Colonic diverticulosis without diverticulitis. 5. Mild splenomegaly. 6. Multiple chronic compression fractures of the lower thoracic and lumbar spine. Aortic Atherosclerosis (ICD10-I70.0) and Emphysema (ICD10-J43.9). Electronically Signed   By: Keith Rake M.D.   On: 09/25/2021 19:17   CT Head Wo Contrast  Result Date: 09/29/2021 CLINICAL DATA:  Head trauma, minor (Age >= 65y) EXAM: CT HEAD WITHOUT CONTRAST TECHNIQUE: Contiguous axial images were obtained from the base of the skull through the vertex without intravenous contrast. RADIATION DOSE REDUCTION: This exam was performed according to the departmental dose-optimization program which includes automated exposure control, adjustment of the mA and/or kV according to patient size and/or use of iterative reconstruction technique. COMPARISON:  June 2022 FINDINGS: Brain: There is no acute intracranial hemorrhage, mass effect, or edema. Gray-white differentiation is preserved. There is no extra-axial fluid collection. Prominence of the ventricles and sulci reflects similar parenchymal volume loss. Patchy hypoattenuation in the supratentorial white matter is nonspecific but probably reflects similar chronic microvascular ischemic changes. Vascular: There is atherosclerotic calcification at the skull base. Skull: Calvarium is unremarkable. Sinuses/Orbits: No acute finding. Other: None. IMPRESSION: No evidence of acute intracranial injury. Electronically Signed   By: Addison Lank.D.  On: 09/29/2021 12:04   CT Cervical Spine Wo Contrast  Result Date: 09/29/2021 CLINICAL DATA:  Neck trauma (Age >= 65y) EXAM: CT CERVICAL SPINE WITHOUT CONTRAST TECHNIQUE: Multidetector CT imaging of the cervical spine was performed without intravenous contrast. Multiplanar CT image reconstructions were also generated. RADIATION DOSE REDUCTION: This  exam was performed according to the departmental dose-optimization program which includes automated exposure control, adjustment of the mA and/or kV according to patient size and/or use of iterative reconstruction technique. COMPARISON:  None Available. FINDINGS: Alignment: Trace retrolisthesis at C4-C5. Skull base and vertebrae: Decreased osseous mineralization. Degenerative endplate irregularity. No acute fracture. Soft tissues and spinal canal: No prevertebral fluid or swelling. No visible canal hematoma. Disc levels: Multilevel degenerative changes are present including disc space narrowing, endplate osteophytes, and facet and uncovertebral hypertrophy. Upper chest: Dictated separately. Other: Mild calcified plaque at the common carotid bifurcations. IMPRESSION: No acute cervical spine fracture. Electronically Signed   By: Macy Mis M.D.   On: 09/29/2021 11:59   CT CHEST HIGH RESOLUTION  Result Date: 10/12/2021 CLINICAL DATA:  Pulmonary infiltrates, history of myelodysplastic syndrome EXAM: CT CHEST WITHOUT CONTRAST TECHNIQUE: Multidetector CT imaging of the chest was performed following the standard protocol without intravenous contrast. High resolution imaging of the lungs, as well as inspiratory and expiratory imaging, was performed. RADIATION DOSE REDUCTION: This exam was performed according to the departmental dose-optimization program which includes automated exposure control, adjustment of the mA and/or kV according to patient size and/or use of iterative reconstruction technique. COMPARISON:  Chest 09/29/2021 FINDINGS: Cardiovascular: Right chest port catheter. Aortic atherosclerosis. Normal heart size. Three-vessel coronary artery calcifications. No pericardial effusion. Mediastinum/Nodes: No enlarged mediastinal, hilar, or axillary lymph nodes. Thyroid gland, trachea, and esophagus demonstrate no significant findings. Lungs/Pleura: Moderate centrilobular and paraseptal emphysema. Improved  irregular and heterogeneous ground-glass airspace opacity throughout the lung bases, with associated bronchiolar plugging. No significant air trapping on expiratory phase imaging. No pleural effusion or pneumothorax. Upper Abdomen: No acute abnormality. Musculoskeletal: No chest wall abnormality. No suspicious osseous lesions identified. Multiple wedge deformities throughout the thoracic spine, unchanged, status post vertebral cement augmentation of T11. IMPRESSION: 1. Improved irregular and heterogeneous ground-glass airspace opacity throughout the lung bases, with associated bronchiolar plugging. Findings are consistent with improved infection or aspiration. 2. There may be some degree of underlying scarring and or fibrosis of the lung bases, however this is difficult to assess in the setting of superimposed acute airspace disease. There are no specific features to distinguish the possibility of fibrotic interstitial lung disease from bland scarring. 3. Emphysema. 4. Coronary artery disease. Aortic Atherosclerosis (ICD10-I70.0) and Emphysema (ICD10-J43.9). Electronically Signed   By: Delanna Ahmadi M.D.   On: 10/12/2021 12:40   CT T-SPINE NO CHARGE  Result Date: 09/29/2021 CLINICAL DATA:  Fall EXAM: CT THORACIC AND LUMBAR SPINE WITHOUT CONTRAST TECHNIQUE: Multidetector CT imaging of the thoracic and lumbar spine was performed without contrast. Multiplanar CT image reconstructions were also generated. RADIATION DOSE REDUCTION: This exam was performed according to the departmental dose-optimization program which includes automated exposure control, adjustment of the mA and/or kV according to patient size and/or use of iterative reconstruction technique. COMPARISON:  CT abdomen pelvis 09/25/2021, CT chest 02/15/2021, CT L-spine 06/02/2019 FINDINGS: CT THORACIC SPINE FINDINGS Alignment: Mild S shaped curvature. Exaggeration of the normal thoracic kyphosis in part secondary to chronic compression deformities of  T6-T8). No listhesis. Vertebrae: No acute fracture or suspicious osseous lesion. Unchanged compression deformities of T6, T7, T8, T9, and T11, with redemonstrated  kyphoplasty in T11. Paraspinal and other soft tissues: Please see same-day CT chest abdomen pelvis. Disc levels: No high-grade spinal canal stenosis. Moderate bilateral neural foraminal narrowing at T10-T11 and T11-T12. CT LUMBAR SPINE FINDINGS Segmentation: 5 lumbar type vertebral bodies. Partial sacralization of L5 with bilateral L5-S1 pseudoarticulation of broadened left-greater-than-right L5 transverse processes with the sacral ala. Alignment: Mild S shaped curvature of the thoracolumbar spine. Straightening and mild reversal of the normal lumbar lordosis. No significant listhesis. Vertebrae: No acute fracture or suspicious osseous lesion. Redemonstrated compression deformities at L2, L3, L4, and L5, with redemonstrated kyphoplasty at L2. Paraspinal and other soft tissues: Please see same-day CT chest abdomen pelvis. Disc levels: Redemonstrated degenerative changes, with mild spinal canal stenosis at L2-L3 and L3-L4. No significant neural foraminal narrowing. IMPRESSION: No acute fracture or traumatic listhesis in the thoracic spine or lumbar spine. Unchanged multilevel compression deformities, status post kyphoplasty at T11 and T12. Please see same-day CT chest abdomen pelvis for soft tissue findings. Electronically Signed   By: Merilyn Baba M.D.   On: 09/29/2021 12:09   CT L-SPINE NO CHARGE  Result Date: 09/29/2021 CLINICAL DATA:  Fall EXAM: CT THORACIC AND LUMBAR SPINE WITHOUT CONTRAST TECHNIQUE: Multidetector CT imaging of the thoracic and lumbar spine was performed without contrast. Multiplanar CT image reconstructions were also generated. RADIATION DOSE REDUCTION: This exam was performed according to the departmental dose-optimization program which includes automated exposure control, adjustment of the mA and/or kV according to patient size  and/or use of iterative reconstruction technique. COMPARISON:  CT abdomen pelvis 09/25/2021, CT chest 02/15/2021, CT L-spine 06/02/2019 FINDINGS: CT THORACIC SPINE FINDINGS Alignment: Mild S shaped curvature. Exaggeration of the normal thoracic kyphosis in part secondary to chronic compression deformities of T6-T8). No listhesis. Vertebrae: No acute fracture or suspicious osseous lesion. Unchanged compression deformities of T6, T7, T8, T9, and T11, with redemonstrated kyphoplasty in T11. Paraspinal and other soft tissues: Please see same-day CT chest abdomen pelvis. Disc levels: No high-grade spinal canal stenosis. Moderate bilateral neural foraminal narrowing at T10-T11 and T11-T12. CT LUMBAR SPINE FINDINGS Segmentation: 5 lumbar type vertebral bodies. Partial sacralization of L5 with bilateral L5-S1 pseudoarticulation of broadened left-greater-than-right L5 transverse processes with the sacral ala. Alignment: Mild S shaped curvature of the thoracolumbar spine. Straightening and mild reversal of the normal lumbar lordosis. No significant listhesis. Vertebrae: No acute fracture or suspicious osseous lesion. Redemonstrated compression deformities at L2, L3, L4, and L5, with redemonstrated kyphoplasty at L2. Paraspinal and other soft tissues: Please see same-day CT chest abdomen pelvis. Disc levels: Redemonstrated degenerative changes, with mild spinal canal stenosis at L2-L3 and L3-L4. No significant neural foraminal narrowing. IMPRESSION: No acute fracture or traumatic listhesis in the thoracic spine or lumbar spine. Unchanged multilevel compression deformities, status post kyphoplasty at T11 and T12. Please see same-day CT chest abdomen pelvis for soft tissue findings. Electronically Signed   By: Merilyn Baba M.D.   On: 09/29/2021 12:09   DG Chest Port 1 View  Result Date: 10/17/2021 CLINICAL DATA:  Removal of right IJ chest port EXAM: PORTABLE CHEST 1 VIEW COMPARISON:  Previous studies including the  examination of 10/12/2021 FINDINGS: There is interval removal of right IJ chest port. There is no pleural effusion or pneumothorax. Severe emphysematous changes are noted. Increased interstitial markings are seen in both lower lung fields and right upper lung fields. There are no new infiltrates or signs of pulmonary edema. There is no significant pleural effusion or pneumothorax. There is previous vertebroplasty and compression fractures  in the thoracic and upper lumbar vertebrae. IMPRESSION: COPD. Linear densities in the lower lung fields suggest scarring. There are no new infiltrates or signs of pulmonary edema. Interval removal of right IJ chest port. Electronically Signed   By: Elmer Picker M.D.   On: 10/17/2021 15:16   DG Chest Port 1 View  Result Date: 10/12/2021 CLINICAL DATA:  Short of breath since last night. Infection at the Port-A-Cath site. History of COPD. History of bone carcinoma. EXAM: PORTABLE CHEST 1 VIEW COMPARISON:  10/02/2021 and older studies. High-resolution chest CT, 10/10/2021. FINDINGS: Cardiac silhouette is normal in size. No mediastinal or hilar masses. Right anterior chest wall, internal jugular, Port-A-Cath is stable. Lungs show diffuse irregular interstitial thickening consistent with fibrosis with underlying emphysema, stable. No lung consolidation. No convincing pulmonary edema. No pleural effusion or pneumothorax. No acute skeletal abnormality. Previous vertebral fractures treated with vertebroplasty. Skeletal structures are diffusely demineralized. IMPRESSION: 1. No acute cardiopulmonary disease. 2. Chronic lung findings Electronically Signed   By: Lajean Manes M.D.   On: 10/12/2021 11:54   DG Chest Port 1 View  Result Date: 10/02/2021 CLINICAL DATA:  Postop port placement EXAM: PORTABLE CHEST 1 VIEW COMPARISON:  Chest radiograph 09/25/2021 FINDINGS: There is a new right chest wall port in place with tip terminating in the mid SVC. The cardiomediastinal silhouette  is stable. There is extensive coarsened interstitial markings throughout both lungs. There is a new small left pleural effusion with adjacent atelectasis. There is no other new or worsening focal airspace disease. There is no significant right pleural effusion. There is no pneumothorax The bones are stable. IMPRESSION: 1. New right chest wall port in place with the tip terminating in the mid SVC. 2. New small left pleural effusion with adjacent atelectasis or pneumonia. Electronically Signed   By: Valetta Mole M.D.   On: 10/02/2021 11:44   DG Chest Port 1 View  Result Date: 09/25/2021 CLINICAL DATA:  Shortness of breath EXAM: PORTABLE CHEST 1 VIEW COMPARISON:  Chest x-ray dated Sep 20, 2021 FINDINGS: Cardiac and mediastinal contours are unchanged. Bilateral reticular opacities, most prominent in the lung bases, similar prior exams and likely due to background emphysema and scarring. No new focal consolidation. Previously described right lung opacity is no longer visible. No large pleural effusion or pneumothorax. IMPRESSION: No new focal consolidation. Previously described right lung opacity is no longer visible, likely resolved atelectasis. Electronically Signed   By: Yetta Glassman M.D.   On: 09/25/2021 12:11   DG C-Arm 1-60 Min-No Report  Result Date: 10/02/2021 Fluoroscopy was utilized by the requesting physician.  No radiographic interpretation.   DG HIP UNILAT WITH PELVIS 2-3 VIEWS LEFT  Result Date: 10/07/2021 CLINICAL DATA:  Pain in left hip. EXAM: DG HIP (WITH OR WITHOUT PELVIS) 2-3V LEFT COMPARISON:  09/29/2021 FINDINGS: The bones appear diffusely osteopenic. No acute fracture or dislocation. Left hip appears intact. Moderate stool burden identified within the colon. IMPRESSION: 1. No acute findings. Electronically Signed   By: Kerby Moors M.D.   On: 10/07/2021 13:55   DG Hip Unilat W or Wo Pelvis 2-3 Views Left  Result Date: 09/29/2021 CLINICAL DATA:  Fall. Tenderness along the left  hip with bruising of the left posterior thigh. EXAM: DG HIP (WITH OR WITHOUT PELVIS) 2-3V LEFT COMPARISON:  CT pelvis 09/25/2021 FINDINGS: Bony demineralization. Chronic compression fractures at L4 and L5. No definite hip fracture identified. Dense prostate gland calcifications. There is irregularity of the right inferior pubic ramus suspicious for a  fracture. Fracture versus accentuated trabeculation laterally in the right superior pubic ramus. IMPRESSION: 1. The left proximal femur appears intact, there is suspicion for potential fractures of the right (contralateral) inferior pubic ramus and possibly superior pubic ramus. This raises the possibility of other fractures in the bony pelvis which might be occult radiographically partially due to the patient's bony demineralization. Special attention to the bony pelvis is recommended on the pelvis CT scan ordered for today. 2. Chronic compression fractures at L4 and L5. Electronically Signed   By: Van Clines M.D.   On: 09/29/2021 10:24   CT CHEST ABDOMEN PELVIS WO CONTRAST  Result Date: 09/29/2021 CLINICAL DATA:  Dizziness, fall, initial encounter. EXAM: CT CHEST, ABDOMEN AND PELVIS WITHOUT CONTRAST TECHNIQUE: Multidetector CT imaging of the chest, abdomen and pelvis was performed following the standard protocol without IV contrast. RADIATION DOSE REDUCTION: This exam was performed according to the departmental dose-optimization program which includes automated exposure control, adjustment of the mA and/or kV according to patient size and/or use of iterative reconstruction technique. COMPARISON:  CT abdomen pelvis 09/25/2021 and CT chest 02/15/2021. FINDINGS: CT CHEST FINDINGS Cardiovascular: Atherosclerotic calcification of the aorta, aortic valve and coronary arteries. There may be papillary muscle calcifications in the left ventricle. Decreased attenuation of the intravascular compartment is indicative of anemia. Heart size within normal limits. No  pericardial effusion. Mediastinum/Nodes: No pathologically enlarged mediastinal or axillary lymph nodes. Hilar regions are difficult to evaluate without IV contrast. Esophagus is grossly unremarkable. Lungs/Pleura: Centrilobular and paraseptal emphysema. Biapical pleuroparenchymal scarring. Dependent patchy ground-glass, septal thickening and areas of consolidation in the lower lobes, progressive from 09/25/2021. Mild patchy ground-glass in the posterior aspect of the upper lobes appears chronic. No pleural fluid. Adherent debris in the airway. Musculoskeletal: Osteopenia. Vertebral body augmentation. Old bilateral rib fractures. Numerous thoracic compression deformities with kyphosis, as on 02/15/2021. CT ABDOMEN PELVIS FINDINGS Hepatobiliary: Liver and gallbladder are unremarkable. Similar biliary ductal dilatation. Pancreas: Negative. Spleen: Negative. Adrenals/Urinary Tract: Adrenal glands are unremarkable. 5.9 cm cyst in the lower pole right [name]. Additional low and high attenuation lesions in the kidneys measure up to 1.8 cm and are likely cysts or too small to characterize. No specific follow-up necessary. Ureters are decompressed. Bladder is grossly unremarkable. Stomach/Bowel: Stomach, small bowel, appendix and colon are unremarkable. Vascular/Lymphatic: Atherosclerotic calcification of the aorta. No pathologically enlarged lymph nodes. Reproductive: Prostate is visualized and contains calcifications. Other: Mild presacral soft tissue thickening, unchanged. No free fluid. Mesenteries and peritoneum are unremarkable. Musculoskeletal: Old right inferior pubic ramus fracture. No acute fracture. Degenerative changes in the spine. L1 vertebral body augmentation. L3, L4 and L5 compression fractures as on 09/25/2021. Large left gluteal hematoma measures approximately 5.0 x 10.2 cm, new from 09/25/2021. Pre-existing inferior left gluteal hematoma (2/121), similar to minimally enlarged. Overlying edema.  IMPRESSION: 1. New and enlarging left gluteal hematomas. No underlying acute fracture. 2. Bilateral lower lobe ground-glass, septal thickening and areas of consolidation, worsening in the interval from 09/25/2021, worrisome for progressive pneumonia. 3. Aortic atherosclerosis (ICD10-I70.0). Coronary artery calcifications. 4.  Emphysema (ICD10-J43.9). Electronically Signed   By: Lorin Picket M.D.   On: 09/29/2021 12:13    Thomas Chrisley Wynetta Emery, MD How to contact the [facility] Attending or Consulting provider [place] or covering provider during after hours [place], for this patient?  Check the care team in [facility] and look for a) attending/consulting [facility] provider listed and b) the [facility] team listed Log into [email or web address] and use [place]'s universal password to access.  If you do not have the password, please contact the hospital operator. Locate the [facility] provider you are looking for under [facility] and page to a number that you can be directly reached. If you still have difficulty reaching the provider, please page the [name] (Director on Call) for the Hospitalists listed on amion for assistance.   [facility]  If 7PM-7AM, please contact night-coverage [email or web address] Password [email or web address] 10/22/2021, 4:24 PM   LOS: 5 days

## 2021-10-22 NOTE — Progress Notes (Signed)
Physical Therapy Treatment Patient Details Name: [name] MRN: [id number] DOB: 06-25-1929 Today's Date: 10/22/2021   History of Present Illness [name] is a 88 year old male with a history of myelodysplastic syndrome, CKD stage IIIb, COPD, GERD, chronic respiratory failure on 2 L, COPD presented to the [facility] for routine visit today.  The patient was seen at the cancer center on 10/14/2021 secondary to hematoma and possible cellulitis around his right IJ Port-A-Cath site.  Blood cultures were drawn on that day and are currently growing methicillin-resistant Staphylococcus epidermidis.  Blood cultures on 10/12/2021 were negative.  The patient was sent back to the [facility] with cephalexin for 7-day course.  He returned to the clinic today with increasing generalized weakness, and nausea and vomiting of 1 day duration.  The patient feels that he was vomiting some blackish type material on 10/16/2021.  He denies any fevers, chills, chest pain, shortness breath, abdominal pain, dysuria, hematuria.  Notably, the patient recently had his current Port-A-Cath placed on 10/02/2021.  His previous Port-A-Cath was removed on 08/07/2021 due to hematoma and possible infection.  He was treated with Augmentin at that time.  The patient noted that he has had progressive pain and swelling about his right-sided Port-A-Cath since 10/10/2021.  When comparing pictures from his previous visit on 10/14/2021, it appears that the patient's edema and hematoma may have progressed.  Notably, the patient has had numerous hospital admissions recently.  Patient was recently admitted to the hospital from 5/11 to 5/13, after he suffered a fall and developed a significant gluteal hematoma resulting in blood loss anemia.  He has been transfused multiple units of PRBCs as well as platelets to keep hemoglobin greater than 8 and platelets greater than 50,000.  He was subsequently [facility] to [facility]  for rehab.  In addition, he had an admission from 09/20/2021 to 09/21/2021 for symptomatic anemia.  He was transfused 2 units PRBC.Marland Kitchen  He was admitted from 08/09/2021 to 08/11/2021 secondary to bleeding from his left-sided Port-A-Cath removal site.  He was transfused 2 units PRBC at that time.  He also received 1 unit platelets.    PT Comments    Patient presents supine in bed on 3 LPM. Patient consents to physical therapy treatment. Patient is min guard assist with bed mobility and transfers still demonstrating generalized weakness and decreased trunk control. Patient limited to ambulating a few side steps at bed side with RW with min guard/assist but was limited by fatigue. Patient tolerated therapeutic exercises but quickly became fatigued with experiencing minor pain in L LE. Patient maintained on 3 LPM during entire session. Patient left in chair with nursing notified of mobility status. Patient will benefit from continued skilled physical therapy in hospital and recommended venue below to increase strength, balance, endurance for safe ADLs and gait.    Recommendations for follow up therapy are one component of a multi-disciplinary discharge planning process, led by the attending physician.  Recommendations may be updated based on patient status, additional functional criteria and insurance authorization.  Follow Up Recommendations  Skilled nursing-short term rehab (<3 hours/day)     Assistance Recommended at Discharge Set up Supervision/Assistance  Patient can return home with the following A lot of help with walking and/or transfers;A lot of help with bathing/dressing/bathroom;Assistance with cooking/housework;Help with stairs or ramp for entrance   Equipment Recommendations  None recommended by PT    Recommendations for Other Services       Precautions / Restrictions  Precautions Precautions: Fall Restrictions Weight Bearing Restrictions: No     Mobility  Bed Mobility Overal bed  mobility: Needs Assistance Bed Mobility: Supine to Sit     Supine to sit: Supervision, Min guard     General bed mobility comments: slightly labored movement    Transfers Overall transfer level: Needs assistance Equipment used: Rolling walker (2 wheels) Transfers: Sit to/from Stand, Bed to chair/wheelchair/BSC Sit to Stand: Min assist, Min guard   Step pivot transfers: Min guard, Min assist       General transfer comment: Patient improved ability to transfer with slightly labored movement and good technical form. Patient does still need increased time to complete task.    Ambulation/Gait Ambulation/Gait assistance: Min assist Gait Distance (Feet): 5 Feet Assistive device: Rolling walker (2 wheels) Gait Pattern/deviations: Decreased step length - right, Decreased step length - left, Decreased stance time - left, Decreased stride length Gait velocity: decreased     General Gait Details: Patient limited to a few side steps at bedside with RW.   Stairs             Wheelchair Mobility    Modified Rankin (Stroke Patients Only)       Balance Overall balance assessment: Needs assistance Sitting-balance support: Feet supported, No upper extremity supported Sitting balance-Leahy Scale: Fair Sitting balance - Comments: fair/good seated at EOB   Standing balance support: During functional activity, Bilateral upper extremity supported Standing balance-Leahy Scale: Fair Standing balance comment: Patient has fair standing balance with RW and able to maintain upright position when moving outside BOS                            Cognition Arousal/Alertness: Awake/alert Behavior During Therapy: WFL for tasks assessed/performed Overall Cognitive Status: Within Functional Limits for tasks assessed                                          Exercises General Exercises - Lower Extremity Long Arc Quad: AROM, Both, 5 reps, Strengthening,  Seated Toe Raises: AROM, Strengthening, Both, Seated, 15 reps Heel Raises: AROM, Strengthening, 15 reps, Seated, Both    General Comments        Pertinent Vitals/Pain Pain Assessment Pain Assessment: Faces Faces Pain Scale: Hurts little more Pain Location: L LE Pain Descriptors / Indicators: Sore Pain Intervention(s): Limited activity within patient's tolerance, Monitored during session, Repositioned    Home Living                          Prior Function            PT Goals (current goals can now be found in the care plan section) Acute Rehab PT Goals Patient Stated Goal: return home after rehab PT Goal Formulation: With patient Time For Goal Achievement: 11/03/21 Potential to Achieve Goals: Good Progress towards PT goals: Progressing toward goals    Frequency    Min 3X/week      PT Plan Current plan remains appropriate    Co-evaluation              AM-PAC PT "6 Clicks" Mobility   Outcome Measure  Help needed turning from your back to your side while in a flat bed without using bedrails?: A Little Help needed moving from lying on your back to sitting on  the side of a flat bed without using bedrails?: A Little Help needed moving to and from a bed to a chair (including a wheelchair)?: A Little Help needed standing up from a chair using your arms (e.g., wheelchair or bedside chair)?: A Little Help needed to walk in hospital room?: A Little Help needed climbing 3-5 steps with a railing? : A Lot 6 Click Score: 17    End of Session Equipment Utilized During Treatment: Oxygen Activity Tolerance: Patient tolerated treatment well;Patient limited by fatigue Patient left: in chair;with call bell/phone within reach Nurse Communication: Mobility status PT Visit Diagnosis: Unsteadiness on feet (R26.81);Other abnormalities of gait and mobility (R26.89);Muscle weakness (generalized) (M62.81)     Time: 9758-8325 PT Time Calculation (min) (ACUTE ONLY): 20  min  Charges:  $Therapeutic Exercise: 8-22 mins $Therapeutic Activity: 8-22 mins                     3:27 PM, 10/22/21 Lestine Box, S/PT

## 2021-10-22 NOTE — Consult Note (Signed)
   [facility] Inpatient Consult   10/22/2021  [place] December 23, 1929 [id number]  [place] Organization [ACO] Patient: Medicare ACO REACH  Coverage for [facility] Liaison for [facility]  Referral: Insurance/QI review for less than 30 days readmission, with 8 admissions in the past 6 months noted  Primary Care Provider:  Daneil Dolin, MD, [facility] [specialist]   Patient screened for hospitalization with noted extreme high risk score for unplanned readmission risk and  to assess for potential [place] Management service needs for post hospital transition.  Review of patient's medical record reveals patient is from [facility], PT/OT progress notes reviewed for recommendations.   Plan:  Current plan notes patient to return to SNF. Continue to follow progress and disposition to assess for post hospital care management needs.    For questions contact:   Natividad Brood, RN BSN [facility] Liaison  [phone] business mobile phone Toll free office [phone]  Fax number: [phone] [email or web address]

## 2021-10-23 ENCOUNTER — Inpatient Hospital Stay (HOSPITAL_COMMUNITY): Payer: Medicare Other

## 2021-10-23 DIAGNOSIS — N1832 Chronic kidney disease, stage 3b: Secondary | ICD-10-CM | POA: Diagnosis not present

## 2021-10-23 DIAGNOSIS — R6510 Systemic inflammatory response syndrome (SIRS) of non-infectious origin without acute organ dysfunction: Secondary | ICD-10-CM | POA: Diagnosis not present

## 2021-10-23 DIAGNOSIS — D469 Myelodysplastic syndrome, unspecified: Secondary | ICD-10-CM | POA: Diagnosis not present

## 2021-10-23 DIAGNOSIS — D62 Acute posthemorrhagic anemia: Secondary | ICD-10-CM | POA: Diagnosis not present

## 2021-10-23 MED ORDER — BENZONATATE 100 MG PO CAPS: 200.0000 mg | ORAL_CAPSULE | Freq: Three times a day (TID) | ORAL | Status: DC | PRN

## 2021-10-23 NOTE — TOC Progression Note (Signed)
Transition of Care [facility]) - Progression Note    Patient Details  Name: [name] MRN: [id number] Date of Birth: 30-Sep-1929  Transition of Care [facility]) CM/SW Contact  Boneta Lucks, RN Phone Number: 10/23/2021, 2:22 PM  Clinical Narrative:   [name] his daughter at the bedside. They are both agreeable to Hospice. They are requesting [facility] [place]. They do not have any beds today.  Patient really wants to go home. [name] is agreeable to take him home and later if needed they will transition to [facility]. Referral sent to [facility]. Marchia Meiers will call [facility] about equipment, Team updated, DC planning for tomorrow morning.    Expected Discharge Plan: Home w Hospice Care Barriers to Discharge: Continued Medical Work up, Hospice Bed not available  Expected Discharge Plan and Services Expected Discharge Plan: [place] In-house Referral: Clinical Social Work   Post Acute Care Choice: Hospice Living arrangements for the past 2 months: [place]                  Readmission Risk Interventions    10/20/2021    8:30 AM 10/03/2021   10:52 AM 10/02/2021   11:51 AM  Readmission Risk Prevention Plan  Transportation Screening Complete  Complete  Medication Review [employer]) Complete    PCP or Specialist appointment within 3-5 days of discharge  Complete   HRI or Home Care Consult Complete    SW Recovery Care/Counseling Consult Complete  Complete  Palliative Care Screening Not Applicable  Not Applicable  Skilled Nursing Facility Complete  Complete

## 2021-10-23 NOTE — Progress Notes (Signed)
PROGRESS NOTE  [name] SEG:[id number] DOB: 1929-10-23 DOA: 10/17/2021 PCP: Daneil Dolin, MD  Brief History:  88 year old male with a history of myelodysplastic syndrome, CKD stage IIIb, COPD, GERD, chronic respiratory failure on 2 L, COPD, chronic hypotension on midodrine presented to the [facility] for routine visit today.  The patient was seen at the cancer center on 10/14/2021 secondary to hematoma and possible cellulitis around his right IJ Port-A-Cath site.  Blood cultures were drawn on that day and are currently growing methicillin-resistant Staphylococcus epidermidis.  Blood cultures on 10/12/2021 were negative.  The patient was sent back to the [facility] with cephalexin for 7-day course.  He returned to the clinic today with increasing generalized weakness, and nausea and vomiting of 1 day duration.  The patient feels that he was vomiting some blackish type material on 10/16/2021.  He denies any fevers, chills, chest pain, shortness breath, abdominal pain, dysuria, hematuria.  Notably, the patient recently had his current Port-A-Cath placed on 10/02/2021.  His previous Port-A-Cath was removed on 08/07/2021 due to hematoma and possible infection.  He was treated with Augmentin at that time. The patient noted that he has had progressive pain and swelling about his right-sided Port-A-Cath since 10/10/2021.  When comparing pictures from his previous visit on 10/14/2021, it appears that the patient's edema and hematoma may have progressed. Notably, the patient has had numerous hospital admissions recently.  Patient was recently admitted to the hospital from 5/11 to 5/13, after he suffered a fall and developed a significant gluteal hematoma resulting in blood loss anemia.  He has been transfused multiple units of PRBCs as well as platelets to keep hemoglobin greater than 8 and platelets greater than 50,000.  He was subsequently discharged to [facility] for  rehab.  In addition, he had an admission from 09/20/2021 to 09/21/2021 for symptomatic anemia.  He was transfused 2 units PRBC.Marland Kitchen  He was admitted from 08/09/2021 to 08/11/2021 secondary to bleeding from his left-sided Port-A-Cath removal site.  He was transfused 2 units PRBC at that time.  He also received 1 unit platelets.  10/23/2021: Spoke with daughter today and she has been visiting at bedside and decision made to take him home with hospice in AM. No beds available at [facility] today.     Assessment/Plan:  Infected Right IJ Port-A-Cath hematoma -cultures = MRSE -General surgery consulted; discussed with Dr. Constance Haw --Port-A-Cath removal---done on 10/17/21 -Repeat blood cultures x2 sets--neg to date -follow up cultures from removed port-a-cath = MRSE -continue vancomycin -plan IV vanc through 10/23/21, then switch to po abx    MRSE Bacteremia 10/14/21 blood culture = MRSE --called microbiology to work up sensitivity of 5/30 blood culture --source = port-a-cath (removed 10/17/21) Follow 10/17/21 blood culture--neg to date -plan IV vanc through 10/23/21   Coffee-ground emesis -Start pantoprazole IV twice daily>>continue 3/8 EGD--Localized hemorrhagic mucosa with active bleeding and with stigmata of bleeding was found in the proximal jejunum. Upon lavage it looked as a vascularized area, ?possible Dieulafoy vs AVM.  Hemoclip placed -Consult GI appreciated>>continue PPI bid, treat conservatively   Acute on chronic anemia Baseline hemoglobin with wide variation, but usually ~8 range -Transfused 2 units PRBC 10/18/21 -transfused one unit platelets 6/5   Myelodysplastic syndrome (HCC) Follows Dr. Delton Coombes -follow up with Dr. Delton Coombes states treatment of MDS on hold due to hospitalizations for syncope Previously on Azacitidine 09/16/20 to 04/21/21 --WBC continues to climb>>add  diff to CBC --concerned about leukemic conversion --CBC with diff in am --6/5 care discussed with Dr. Delton Coombes    Thrombocytopenia [facility]) Has been chronic due to MDS,  30-40K range   CKD (chronic kidney disease), stage IIIB Baseline creatinine 1.5-1.8 Serial BMPs   COPD (chronic obstructive pulmonary disease) Baseline 3 L oxygen requirement Continue albuterol as needed Stable   Chronic hypotension -Continue on midodrine   Fall with left gluteal hematoma -currently at [facility] for rehab Left hip hematoma appears to be involuting   Goals of Care 6/5--long discussion with patient's daughter Stanton [facility]ney --may be beyond window of taking patient to [place] --She is coming back to  in next 1-2 days  --pt expressed he is ready for hospice at home --waiting for daughter to return here from [place] for home hospice to be set up --plan d/c home when [facility] returns --6/6 total time 50 min.  Subjective: He still really wants to go home with hospice.  He really wants to go today.    Objective: Vitals:   10/22/21 2020 10/23/21 0300 10/23/21 0803 10/23/21 1305  BP: 115/70 112/66  115/61  Pulse: 84 72  85  Resp: '18 18  18  '$ Temp: (!) 97.4 F (36.3 C) 97.7 F (36.5 C)  98 F (36.7 C)  TempSrc: Oral Oral    SpO2: 94% 97% 96% 96%  Weight:      Height:        Intake/Output Summary (Last 24 hours) at 10/23/2021 1552 Last data filed at 10/23/2021 0900 Gross per 24 hour  Intake 440 ml  Output 1350 ml  Net -910 ml   Weight change:  Exam:  General:  he appears comfortable lying in bed, NAD.  Cooperative.   HEENT: supple neck, no thyroid nodules or masses palpated.  Cardiovascular: normal s1, s2 sounds.  No MRG Respiratory:  mild increased work of breathing.  Abdomen: soft, NDNT, no HSM.  Extremities: no CCE.   Data Reviewed: I have personally reviewed following labs and imaging studies Basic Metabolic Panel: Recent Labs  Lab 10/17/21 1433 10/18/21 0229 10/20/21 0501  NA 135 135 134*  K 4.3 3.9 3.1*  CL 108 111 110  CO2 22 21* 19*  GLUCOSE 102* 87 91  BUN 42* 42* 33*  CREATININE  2.20* 2.19* 1.86*  CALCIUM 9.1 8.3* 8.9  MG  --  1.8  --    Liver Function Tests: No results for input(s): "AST", "ALT", "ALKPHOS", "BILITOT", "PROT", "ALBUMIN" in the last 168 hours. No results for input(s): "LIPASE", "AMYLASE" in the last 168 hours. No results for input(s): "AMMONIA" in the last 168 hours. Coagulation Profile: No results for input(s): "INR", "PROTIME" in the last 168 hours. CBC: Recent Labs  Lab 10/17/21 0922 10/18/21 0038 10/18/21 0229 10/18/21 1410 10/19/21 1451 10/20/21 0501 10/21/21 0504  WBC 38.5*  --  44.8*  --  40.4* 36.7* 37.9*  NEUTROABS  --   --   --   --   --  23.5*  --   HGB 7.8*   < > 6.3* 10.0* 10.3* 9.5* 8.9*  8.9*  HCT 25.1*   < > 20.6* 31.6* 31.2* 28.9* 27.2*  27.2*  MCV 84.5  --  84.4  --  85.5 85.5 86.1  PLT 44*  --  62*  --  35* 31* 43*   < > = values in this interval not displayed.   Cardiac Enzymes: No results for input(s): "CKTOTAL", "CKMB", "CKMBINDEX", "TROPONINI" in the last 168 hours.  BNP: Invalid input(s): "POCBNP" CBG: No results for input(s): "GLUCAP" in the last 168 hours. HbA1C: No results for input(s): "HGBA1C" in the last 72 hours. Urine analysis:    Component Value Date/Time   COLORURINE YELLOW 10/17/2021 1351   APPEARANCEUR HAZY (A) 10/17/2021 1351   APPEARANCEUR Hazy (A) 03/26/2021 1334   LABSPEC 1.015 10/17/2021 1351   PHURINE 5.0 10/17/2021 1351   GLUCOSEU NEGATIVE 10/17/2021 1351   HGBUR MODERATE (A) 10/17/2021 1351   BILIRUBINUR NEGATIVE 10/17/2021 1351   BILIRUBINUR Negative 03/26/2021 1334   KETONESUR NEGATIVE 10/17/2021 1351   PROTEINUR 100 (A) 10/17/2021 1351   UROBILINOGEN 0.2 08/15/2013 1240   NITRITE NEGATIVE 10/17/2021 1351   LEUKOCYTESUR NEGATIVE 10/17/2021 1351   Recent Results (from the past 240 hour(s))  Culture, blood (routine x 2)     Status: Abnormal   Collection Time: 10/14/21  3:12 PM   Specimen: Left Antecubital; Blood  Result Value Ref Range Status   Specimen Description   Final     LEFT ANTECUBITAL Performed at [facility], [address] [place], [place] [address]    Special Requests   Final    BOTTLES DRAWN AEROBIC AND ANAEROBIC Blood Culture adequate volume Performed at [facility], [address], [place], [place] [address]    Culture  Setup Time   Final    GRAM POSITIVE COCCI Gram Stain Report Called to,Read Back By and Verified With: T MYERS [id number] K FORSYTH AEROBIC BOTTLE ONLY CRITICAL RESULT CALLED TO, READ BACK BY AND VERIFIED WITH: RN Bonney Leitz [phone] '@2034'$  FH Performed at [facility], [place] [address] [place], [place] [address]    Culture STAPHYLOCOCCUS EPIDERMIDIS (A)  Final   Report Status 10/21/2021 FINAL  Final   Organism ID, Bacteria STAPHYLOCOCCUS EPIDERMIDIS  Final      Susceptibility   Staphylococcus epidermidis - MIC*    CIPROFLOXACIN >=8 RESISTANT Resistant     ERYTHROMYCIN >=8 RESISTANT Resistant     GENTAMICIN <=0.5 SENSITIVE Sensitive     OXACILLIN >=4 RESISTANT Resistant     TETRACYCLINE 2 SENSITIVE Sensitive     VANCOMYCIN <=0.5 SENSITIVE Sensitive     TRIMETH/SULFA 80 RESISTANT Resistant     CLINDAMYCIN >=8 RESISTANT Resistant     RIFAMPIN <=0.5 SENSITIVE Sensitive     Inducible Clindamycin NEGATIVE Sensitive     * STAPHYLOCOCCUS EPIDERMIDIS  Blood Culture ID Panel (Reflexed)     Status: Abnormal   Collection Time: 10/14/21  3:12 PM  Result Value Ref Range Status   Enterococcus faecalis NOT DETECTED NOT DETECTED Final   Enterococcus Faecium NOT DETECTED NOT DETECTED Final   Listeria monocytogenes NOT DETECTED NOT DETECTED Final   Staphylococcus species DETECTED (A) NOT DETECTED Final    Comment: CRITICAL RESULT CALLED TO, READ BACK BY AND VERIFIED WITH: RN Ladell Pier [phone] '@2034'$  FH    Staphylococcus aureus (BCID) NOT DETECTED NOT DETECTED Final   Staphylococcus epidermidis DETECTED (A) NOT DETECTED Final    Comment: Methicillin (oxacillin) resistant coagulase negative staphylococcus. Possible blood culture  contaminant (unless isolated from more than one blood culture draw or clinical case suggests pathogenicity). No antibiotic treatment is indicated for blood  culture contaminants. CRITICAL RESULT CALLED TO, READ BACK BY AND VERIFIED WITH: RN Bonney Leitz [phone] '@2034'$  FH    Staphylococcus lugdunensis NOT DETECTED NOT DETECTED Final   Streptococcus species NOT DETECTED NOT DETECTED Final   Streptococcus agalactiae NOT DETECTED NOT DETECTED Final   Streptococcus pneumoniae NOT DETECTED NOT DETECTED Final  Streptococcus pyogenes NOT DETECTED NOT DETECTED Final   A.calcoaceticus-baumannii NOT DETECTED NOT DETECTED Final   Bacteroides fragilis NOT DETECTED NOT DETECTED Final   Enterobacterales NOT DETECTED NOT DETECTED Final   Enterobacter cloacae complex NOT DETECTED NOT DETECTED Final   Escherichia coli NOT DETECTED NOT DETECTED Final   Klebsiella aerogenes NOT DETECTED NOT DETECTED Final   Klebsiella oxytoca NOT DETECTED NOT DETECTED Final   Klebsiella pneumoniae NOT DETECTED NOT DETECTED Final   Proteus species NOT DETECTED NOT DETECTED Final   Salmonella species NOT DETECTED NOT DETECTED Final   Serratia marcescens NOT DETECTED NOT DETECTED Final   Haemophilus influenzae NOT DETECTED NOT DETECTED Final   Neisseria meningitidis NOT DETECTED NOT DETECTED Final   Pseudomonas aeruginosa NOT DETECTED NOT DETECTED Final   Stenotrophomonas maltophilia NOT DETECTED NOT DETECTED Final   Candida albicans NOT DETECTED NOT DETECTED Final   Candida auris NOT DETECTED NOT DETECTED Final   Candida glabrata NOT DETECTED NOT DETECTED Final   Candida krusei NOT DETECTED NOT DETECTED Final   Candida parapsilosis NOT DETECTED NOT DETECTED Final   Candida tropicalis NOT DETECTED NOT DETECTED Final   Cryptococcus neoformans/gattii NOT DETECTED NOT DETECTED Final   Methicillin resistance mecA/C DETECTED (A) NOT DETECTED Final    Comment: CRITICAL RESULT CALLED TO, READ BACK BY AND VERIFIED WITH: RN Ladell Pier  [phone] '@2034'$  FH Performed at [facility], [address], [place], [place] [address]   Aerobic/Anaerobic Culture w Gram Stain (surgical/deep wound)     Status: None   Collection Time: 10/17/21 11:57 AM   Specimen: Catheter Tip  Result Value Ref Range Status   Specimen Description   Final    CATH TIP Performed at [facility], [address], [place], [place] [address]    Special Requests   Final    Immunocompromised Performed at [facility], [address] [place], [place] [address]    Gram Stain NO WBC SEEN NO ORGANISMS SEEN   Final   Culture   Final    RARE STAPHYLOCOCCUS EPIDERMIDIS NO ANAEROBES ISOLATED Performed at [facility], [place] [address], [place], [place] [address]    Report Status 10/22/2021 FINAL  Final   Organism ID, Bacteria STAPHYLOCOCCUS EPIDERMIDIS  Final      Susceptibility   Staphylococcus epidermidis - MIC*    CIPROFLOXACIN >=8 RESISTANT Resistant     ERYTHROMYCIN >=8 RESISTANT Resistant     GENTAMICIN <=0.5 SENSITIVE Sensitive     OXACILLIN >=4 RESISTANT Resistant     TETRACYCLINE 2 SENSITIVE Sensitive     VANCOMYCIN 1 SENSITIVE Sensitive     TRIMETH/SULFA 80 RESISTANT Resistant     CLINDAMYCIN >=8 RESISTANT Resistant     RIFAMPIN <=0.5 SENSITIVE Sensitive     Inducible Clindamycin NEGATIVE Sensitive     * RARE STAPHYLOCOCCUS EPIDERMIDIS  Aerobic/Anaerobic Culture w Gram Stain (surgical/deep wound)     Status: None   Collection Time: 10/17/21 11:58 AM   Specimen: Wound  Result Value Ref Range Status   Specimen Description   Final    WOUND HEMATOMA Performed at [facility] [address] [place], [place] [address]    Special Requests   Final    Immunocompromised Performed at [facility], [address], [place],  [address]    Gram Stain   Final    ABUNDANT WBC PRESENT, PREDOMINANTLY PMN NO ORGANISMS SEEN    Culture   Final    FEW STAPHYLOCOCCUS EPIDERMIDIS NO ANAEROBES ISOLATED Performed at  [facility],  [place] [address], [place], [place] [address]    Report Status 10/22/2021 FINAL  Final   Organism ID, Bacteria STAPHYLOCOCCUS EPIDERMIDIS  Final      Susceptibility   Staphylococcus epidermidis - MIC*    CIPROFLOXACIN >=8 RESISTANT Resistant     ERYTHROMYCIN >=8 RESISTANT Resistant     GENTAMICIN <=0.5 SENSITIVE Sensitive     OXACILLIN >=4 RESISTANT Resistant     TETRACYCLINE 2 SENSITIVE Sensitive     VANCOMYCIN 1 SENSITIVE Sensitive     TRIMETH/SULFA 80 RESISTANT Resistant     CLINDAMYCIN >=8 RESISTANT Resistant     RIFAMPIN <=0.5 SENSITIVE Sensitive     Inducible Clindamycin NEGATIVE Sensitive     * FEW STAPHYLOCOCCUS EPIDERMIDIS  MRSA Next Gen by PCR, Nasal     Status: None   Collection Time: 10/17/21 12:11 PM   Specimen: Nasal Mucosa; Nasal Swab  Result Value Ref Range Status   MRSA by PCR Next Gen NOT DETECTED NOT DETECTED Final    Comment: (NOTE) The GeneXpert MRSA Assay (FDA approved for NASAL specimens only), is one component of a comprehensive MRSA colonization surveillance program. It is not intended to diagnose MRSA infection nor to guide or monitor treatment for MRSA infections. Test performance is not FDA approved in patients less than 63 years old. Performed at [facility], [address], [place], [place] [address]   Culture, blood (Routine X 2) w Reflex to ID Panel     Status: None   Collection Time: 10/17/21  2:33 PM   Specimen: BLOOD LEFT HAND  Result Value Ref Range Status   Specimen Description BLOOD LEFT HAND  Final   Special Requests   Final    BOTTLES DRAWN AEROBIC AND ANAEROBIC Blood Culture adequate volume   Culture   Final    NO GROWTH 5 DAYS Performed at [facility], [address] [place], [place] [address]    Report Status 10/22/2021 FINAL  Final  Culture, blood (Routine X 2) w Reflex to ID Panel     Status: None   Collection Time: 10/17/21  2:33 PM   Specimen: BLOOD RIGHT HAND  Result Value Ref Range Status   Specimen Description BLOOD RIGHT HAND   Final   Special Requests   Final    BOTTLES DRAWN AEROBIC AND ANAEROBIC Blood Culture adequate volume   Culture   Final    NO GROWTH 5 DAYS Performed at [facility], [address], [place], [place] [address]    Report Status 10/22/2021 FINAL  Final     Scheduled Meds:  sodium chloride  250 mL Intravenous Once   sodium chloride   Intravenous Once   acetaminophen  650 mg Oral Once   Chlorhexidine Gluconate Cloth  6 each Topical Q0600   diphenhydrAMINE  25 mg Oral Once   mouth rinse  15 mL Mouth Rinse BID   midodrine  5 mg Oral TID WC   pantoprazole (PROTONIX) IV  40 mg Intravenous Q12H   predniSONE  5 mg Oral Q breakfast   umeclidinium-vilanterol  1 puff Inhalation Daily   Continuous Infusions:  vancomycin 750 mg (10/21/21 2312)    Procedures/Studies: DG Chest Port 1 View  Result Date: 10/17/2021 CLINICAL DATA:  Removal of right IJ chest port EXAM: PORTABLE CHEST 1 VIEW COMPARISON:  Previous studies including the examination of 10/12/2021 FINDINGS: There is interval removal of right IJ chest port. There is no pleural effusion or pneumothorax. Severe emphysematous changes are noted.  Increased interstitial markings are seen in both lower lung fields and right upper lung fields. There are no new infiltrates or signs of pulmonary edema. There is no significant pleural effusion or pneumothorax. There is previous vertebroplasty and compression fractures in the thoracic and upper lumbar vertebrae. IMPRESSION: COPD. Linear densities in the lower lung fields suggest scarring. There are no new infiltrates or signs of pulmonary edema. Interval removal of right IJ chest port. Electronically Signed   By: Elmer Picker M.D.   On: 10/17/2021 15:16   CT CHEST HIGH RESOLUTION  Result Date: 10/12/2021 CLINICAL DATA:  Pulmonary infiltrates, history of myelodysplastic syndrome EXAM: CT CHEST WITHOUT CONTRAST TECHNIQUE: Multidetector CT imaging of the chest was performed following the standard  protocol without intravenous contrast. High resolution imaging of the lungs, as well as inspiratory and expiratory imaging, was performed. RADIATION DOSE REDUCTION: This exam was performed according to the departmental dose-optimization program which includes automated exposure control, adjustment of the mA and/or kV according to patient size and/or use of iterative reconstruction technique. COMPARISON:  Chest 09/29/2021 FINDINGS: Cardiovascular: Right chest port catheter. Aortic atherosclerosis. Normal heart size. Three-vessel coronary artery calcifications. No pericardial effusion. Mediastinum/Nodes: No enlarged mediastinal, hilar, or axillary lymph nodes. Thyroid gland, trachea, and esophagus demonstrate no significant findings. Lungs/Pleura: Moderate centrilobular and paraseptal emphysema. Improved irregular and heterogeneous ground-glass airspace opacity throughout the lung bases, with associated bronchiolar plugging. No significant air trapping on expiratory phase imaging. No pleural effusion or pneumothorax. Upper Abdomen: No acute abnormality. Musculoskeletal: No chest wall abnormality. No suspicious osseous lesions identified. Multiple wedge deformities throughout the thoracic spine, unchanged, status post vertebral cement augmentation of T11. IMPRESSION: 1. Improved irregular and heterogeneous ground-glass airspace opacity throughout the lung bases, with associated bronchiolar plugging. Findings are consistent with improved infection or aspiration. 2. There may be some degree of underlying scarring and or fibrosis of the lung bases, however this is difficult to assess in the setting of superimposed acute airspace disease. There are no specific features to distinguish the possibility of fibrotic interstitial lung disease from bland scarring. 3. Emphysema. 4. Coronary artery disease. Aortic Atherosclerosis (ICD10-I70.0) and Emphysema (ICD10-J43.9). Electronically Signed   By: Delanna Ahmadi M.D.   On:  10/12/2021 12:40   DG Chest Port 1 View  Result Date: 10/12/2021 CLINICAL DATA:  Short of breath since last night. Infection at the Port-A-Cath site. History of COPD. History of bone carcinoma. EXAM: PORTABLE CHEST 1 VIEW COMPARISON:  10/02/2021 and older studies. High-resolution chest CT, 10/10/2021. FINDINGS: Cardiac silhouette is normal in size. No mediastinal or hilar masses. Right anterior chest wall, internal jugular, Port-A-Cath is stable. Lungs show diffuse irregular interstitial thickening consistent with fibrosis with underlying emphysema, stable. No lung consolidation. No convincing pulmonary edema. No pleural effusion or pneumothorax. No acute skeletal abnormality. Previous vertebral fractures treated with vertebroplasty. Skeletal structures are diffusely demineralized. IMPRESSION: 1. No acute cardiopulmonary disease. 2. Chronic lung findings Electronically Signed   By: Lajean Manes M.D.   On: 10/12/2021 11:54   DG HIP UNILAT WITH PELVIS 2-3 VIEWS LEFT  Result Date: 10/07/2021 CLINICAL DATA:  Pain in left hip. EXAM: DG HIP (WITH OR WITHOUT PELVIS) 2-3V LEFT COMPARISON:  09/29/2021 FINDINGS: The bones appear diffusely osteopenic. No acute fracture or dislocation. Left hip appears intact. Moderate stool burden identified within the colon. IMPRESSION: 1. No acute findings. Electronically Signed   By: Kerby Moors M.D.   On: 10/07/2021 13:55   DG Chest Port 1 View  Result Date: 10/02/2021 CLINICAL  DATA:  Postop port placement EXAM: PORTABLE CHEST 1 VIEW COMPARISON:  Chest radiograph 09/25/2021 FINDINGS: There is a new right chest wall port in place with tip terminating in the mid SVC. The cardiomediastinal silhouette is stable. There is extensive coarsened interstitial markings throughout both lungs. There is a new small left pleural effusion with adjacent atelectasis. There is no other new or worsening focal airspace disease. There is no significant right pleural effusion. There is no  pneumothorax The bones are stable. IMPRESSION: 1. New right chest wall port in place with the tip terminating in the mid SVC. 2. New small left pleural effusion with adjacent atelectasis or pneumonia. Electronically Signed   By: Valetta Mole M.D.   On: 10/02/2021 11:44   DG C-Arm 1-60 Min-No Report  Result Date: 10/02/2021 Fluoroscopy was utilized by the requesting physician.  No radiographic interpretation.   CT CHEST ABDOMEN PELVIS WO CONTRAST  Result Date: 09/29/2021 CLINICAL DATA:  Dizziness, fall, initial encounter. EXAM: CT CHEST, ABDOMEN AND PELVIS WITHOUT CONTRAST TECHNIQUE: Multidetector CT imaging of the chest, abdomen and pelvis was performed following the standard protocol without IV contrast. RADIATION DOSE REDUCTION: This exam was performed according to the departmental dose-optimization program which includes automated exposure control, adjustment of the mA and/or kV according to patient size and/or use of iterative reconstruction technique. COMPARISON:  CT abdomen pelvis 09/25/2021 and CT chest 02/15/2021. FINDINGS: CT CHEST FINDINGS Cardiovascular: Atherosclerotic calcification of the aorta, aortic valve and coronary arteries. There may be papillary muscle calcifications in the left ventricle. Decreased attenuation of the intravascular compartment is indicative of anemia. Heart size within normal limits. No pericardial effusion. Mediastinum/Nodes: No pathologically enlarged mediastinal or axillary lymph nodes. Hilar regions are difficult to evaluate without IV contrast. Esophagus is grossly unremarkable. Lungs/Pleura: Centrilobular and paraseptal emphysema. Biapical pleuroparenchymal scarring. Dependent patchy ground-glass, septal thickening and areas of consolidation in the lower lobes, progressive from 09/25/2021. Mild patchy ground-glass in the posterior aspect of the upper lobes appears chronic. No pleural fluid. Adherent debris in the airway. Musculoskeletal: Osteopenia. Vertebral body  augmentation. Old bilateral rib fractures. Numerous thoracic compression deformities with kyphosis, as on 02/15/2021. CT ABDOMEN PELVIS FINDINGS Hepatobiliary: Liver and gallbladder are unremarkable. Similar biliary ductal dilatation. Pancreas: Negative. Spleen: Negative. Adrenals/Urinary Tract: Adrenal glands are unremarkable. 5.9 cm cyst in the lower pole right kidney. Additional low and high attenuation lesions in the kidneys measure up to 1.8 cm and are likely cysts or too small to characterize. No specific follow-up necessary. Ureters are decompressed. Bladder is grossly unremarkable. Stomach/Bowel: Stomach, small bowel, appendix and colon are unremarkable. Vascular/Lymphatic: Atherosclerotic calcification of the aorta. No pathologically enlarged lymph nodes. Reproductive: Prostate is visualized and contains calcifications. Other: Mild presacral soft tissue thickening, unchanged. No free fluid. Mesenteries and peritoneum are unremarkable. Musculoskeletal: Old right inferior pubic ramus fracture. No acute fracture. Degenerative changes in the spine. L1 vertebral body augmentation. L3, L4 and L5 compression fractures as on 09/25/2021. Large left gluteal hematoma measures approximately 5.0 x 10.2 cm, new from 09/25/2021. Pre-existing inferior left gluteal hematoma (2/121), similar to minimally enlarged. Overlying edema. IMPRESSION: 1. New and enlarging left gluteal hematomas. No underlying acute fracture. 2. Bilateral lower lobe ground-glass, septal thickening and areas of consolidation, worsening in the interval from 09/25/2021, worrisome for progressive pneumonia. 3. Aortic atherosclerosis (ICD10-I70.0). Coronary artery calcifications. 4.  Emphysema (ICD10-J43.9). Electronically Signed   By: Lorin Picket M.D.   On: 09/29/2021 12:13   CT T-SPINE NO CHARGE  Result Date: 09/29/2021 CLINICAL DATA:  Fall EXAM: CT THORACIC AND LUMBAR SPINE WITHOUT CONTRAST TECHNIQUE: Multidetector CT imaging of the thoracic and  lumbar spine was performed without contrast. Multiplanar CT image reconstructions were also generated. RADIATION DOSE REDUCTION: This exam was performed according to the departmental dose-optimization program which includes automated exposure control, adjustment of the mA and/or kV according to patient size and/or use of iterative reconstruction technique. COMPARISON:  CT abdomen pelvis 09/25/2021, CT chest 02/15/2021, CT L-spine 06/02/2019 FINDINGS: CT THORACIC SPINE FINDINGS Alignment: Mild S shaped curvature. Exaggeration of the normal thoracic kyphosis in part secondary to chronic compression deformities of T6-T8). No listhesis. Vertebrae: No acute fracture or suspicious osseous lesion. Unchanged compression deformities of T6, T7, T8, T9, and T11, with redemonstrated kyphoplasty in T11. Paraspinal and other soft tissues: Please see same-day CT chest abdomen pelvis. Disc levels: No high-grade spinal canal stenosis. Moderate bilateral neural foraminal narrowing at T10-T11 and T11-T12. CT LUMBAR SPINE FINDINGS Segmentation: 5 lumbar type vertebral bodies. Partial sacralization of L5 with bilateral L5-S1 pseudoarticulation of broadened left-greater-than-right L5 transverse processes with the sacral ala. Alignment: Mild S shaped curvature of the thoracolumbar spine. Straightening and mild reversal of the normal lumbar lordosis. No significant listhesis. Vertebrae: No acute fracture or suspicious osseous lesion. Redemonstrated compression deformities at L2, L3, L4, and L5, with redemonstrated kyphoplasty at L2. Paraspinal and other soft tissues: Please see same-day CT chest abdomen pelvis. Disc levels: Redemonstrated degenerative changes, with mild spinal canal stenosis at L2-L3 and L3-L4. No significant neural foraminal narrowing. IMPRESSION: No acute fracture or traumatic listhesis in the thoracic spine or lumbar spine. Unchanged multilevel compression deformities, status post kyphoplasty at T11 and T12. Please see  same-day CT chest abdomen pelvis for soft tissue findings. Electronically Signed   By: Merilyn Baba M.D.   On: 09/29/2021 12:09   CT L-SPINE NO CHARGE  Result Date: 09/29/2021 CLINICAL DATA:  Fall EXAM: CT THORACIC AND LUMBAR SPINE WITHOUT CONTRAST TECHNIQUE: Multidetector CT imaging of the thoracic and lumbar spine was performed without contrast. Multiplanar CT image reconstructions were also generated. RADIATION DOSE REDUCTION: This exam was performed according to the departmental dose-optimization program which includes automated exposure control, adjustment of the mA and/or kV according to patient size and/or use of iterative reconstruction technique. COMPARISON:  CT abdomen pelvis 09/25/2021, CT chest 02/15/2021, CT L-spine 06/02/2019 FINDINGS: CT THORACIC SPINE FINDINGS Alignment: Mild S shaped curvature. Exaggeration of the normal thoracic kyphosis in part secondary to chronic compression deformities of T6-T8). No listhesis. Vertebrae: No acute fracture or suspicious osseous lesion. Unchanged compression deformities of T6, T7, T8, T9, and T11, with redemonstrated kyphoplasty in T11. Paraspinal and other soft tissues: Please see same-day CT chest abdomen pelvis. Disc levels: No high-grade spinal canal stenosis. Moderate bilateral neural foraminal narrowing at T10-T11 and T11-T12. CT LUMBAR SPINE FINDINGS Segmentation: 5 lumbar type vertebral bodies. Partial sacralization of L5 with bilateral L5-S1 pseudoarticulation of broadened left-greater-than-right L5 transverse processes with the sacral ala. Alignment: Mild S shaped curvature of the thoracolumbar spine. Straightening and mild reversal of the normal lumbar lordosis. No significant listhesis. Vertebrae: No acute fracture or suspicious osseous lesion. Redemonstrated compression deformities at L2, L3, L4, and L5, with redemonstrated kyphoplasty at L2. Paraspinal and other soft tissues: Please see same-day CT chest abdomen pelvis. Disc levels:  Redemonstrated degenerative changes, with mild spinal canal stenosis at L2-L3 and L3-L4. No significant neural foraminal narrowing. IMPRESSION: No acute fracture or traumatic listhesis in the thoracic spine or lumbar spine. Unchanged multilevel compression deformities, status post kyphoplasty at  T11 and T12. Please see same-day CT chest abdomen pelvis for soft tissue findings. Electronically Signed   By: Merilyn Baba M.D.   On: 09/29/2021 12:09   CT Head Wo Contrast  Result Date: 09/29/2021 CLINICAL DATA:  Head trauma, minor (Age >= 65y) EXAM: CT HEAD WITHOUT CONTRAST TECHNIQUE: Contiguous axial images were obtained from the base of the skull through the vertex without intravenous contrast. RADIATION DOSE REDUCTION: This exam was performed according to the departmental dose-optimization program which includes automated exposure control, adjustment of the mA and/or kV according to patient size and/or use of iterative reconstruction technique. COMPARISON:  June 2022 FINDINGS: Brain: There is no acute intracranial hemorrhage, mass effect, or edema. Gray-white differentiation is preserved. There is no extra-axial fluid collection. Prominence of the ventricles and sulci reflects similar parenchymal volume loss. Patchy hypoattenuation in the supratentorial white matter is nonspecific but probably reflects similar chronic microvascular ischemic changes. Vascular: There is atherosclerotic calcification at the skull base. Skull: Calvarium is unremarkable. Sinuses/Orbits: No acute finding. Other: None. IMPRESSION: No evidence of acute intracranial injury. Electronically Signed   By: Macy Mis M.D.   On: 09/29/2021 12:04   CT Cervical Spine Wo Contrast  Result Date: 09/29/2021 CLINICAL DATA:  Neck trauma (Age >= 65y) EXAM: CT CERVICAL SPINE WITHOUT CONTRAST TECHNIQUE: Multidetector CT imaging of the cervical spine was performed without intravenous contrast. Multiplanar CT image reconstructions were also  generated. RADIATION DOSE REDUCTION: This exam was performed according to the departmental dose-optimization program which includes automated exposure control, adjustment of the mA and/or kV according to patient size and/or use of iterative reconstruction technique. COMPARISON:  None Available. FINDINGS: Alignment: Trace retrolisthesis at C4-C5. Skull base and vertebrae: Decreased osseous mineralization. Degenerative endplate irregularity. No acute fracture. Soft tissues and spinal canal: No prevertebral fluid or swelling. No visible canal hematoma. Disc levels: Multilevel degenerative changes are present including disc space narrowing, endplate osteophytes, and facet and uncovertebral hypertrophy. Upper chest: Dictated separately. Other: Mild calcified plaque at the common carotid bifurcations. IMPRESSION: No acute cervical spine fracture. Electronically Signed   By: Macy Mis M.D.   On: 09/29/2021 11:59   DG Hip Unilat W or Wo Pelvis 2-3 Views Left  Result Date: 09/29/2021 CLINICAL DATA:  Fall. Tenderness along the left hip with bruising of the left posterior thigh. EXAM: DG HIP (WITH OR WITHOUT PELVIS) 2-3V LEFT COMPARISON:  CT pelvis 09/25/2021 FINDINGS: Bony demineralization. Chronic compression fractures at L4 and L5. No definite hip fracture identified. Dense prostate gland calcifications. There is irregularity of the right inferior pubic ramus suspicious for a fracture. Fracture versus accentuated trabeculation laterally in the right superior pubic ramus. IMPRESSION: 1. The left proximal femur appears intact, there is suspicion for potential fractures of the right (contralateral) inferior pubic ramus and possibly superior pubic ramus. This raises the possibility of other fractures in the bony pelvis which might be occult radiographically partially due to the patient's bony demineralization. Special attention to the bony pelvis is recommended on the pelvis CT scan ordered for today. 2. Chronic  compression fractures at L4 and L5. Electronically Signed   By: Van Clines M.D.   On: 09/29/2021 10:24   CT ABDOMEN PELVIS WO CONTRAST  Result Date: 09/25/2021 CLINICAL DATA:  Weakness and anemia.  Fall on left side 09/22/2021 EXAM: CT ABDOMEN AND PELVIS WITHOUT CONTRAST TECHNIQUE: Multidetector CT imaging of the abdomen and pelvis was performed following the standard protocol without IV contrast. RADIATION DOSE REDUCTION: This exam was performed according to  the departmental dose-optimization program which includes automated exposure control, adjustment of the mA and/or kV according to patient size and/or use of iterative reconstruction technique. COMPARISON:  CT 09/23/2016 FINDINGS: Lower chest: Basilar emphysema. Patchy consolidation within the left greater than right lower lobes suspicious for pneumonia, including aspiration. Trace left pleural effusion. Hepatobiliary: There is no evidence of focal hepatic abnormality on this unenhanced exam. Gallbladder physiologically distended, no calcified stone. No biliary dilatation. Pancreas: No ductal dilatation or inflammation. Normal for age pancreatic atrophy. Spleen: The spleen measures 12.1 x 7.7 x 9.8 cm (volume = 480 cm^3), mildly enlarged. No focal abnormality or perisplenic fluid on this unenhanced exam. Adrenals/Urinary Tract: No adrenal nodule. There is no hydronephrosis. There is left renal parenchymal thinning. Multiple bilateral cysts of varying sizes in density, incompletely characterized on this unenhanced exam. These are grossly unchanged from 2018 exam, and no specific imaging follow-up is recommended. Unremarkable urinary bladder. Stomach/Bowel: There is ingested material distending the stomach. Occasional fluid-filled small bowel without obstruction or inflammation. Moderate volume of colonic stool. No colonic wall thickening or inflammatory change. Left colonic diverticulosis, prominent in the sigmoid, no diverticulitis.  Vascular/Lymphatic: Aortic atherosclerosis. No aortic aneurysm. There is no periaortic stranding. No retroperitoneal fluid. No bulky abdominopelvic adenopathy. Reproductive: Prostatic calcifications. Other: There is soft tissue edema involving the greater than right flank subcutaneous tissues dependently, as well as midline skin thickening. Edematous changes extend over the left hip where there is also enlargement of the left gluteus muscles as described below. There is no free air or free fluid within the abdomen or pelvis. The right testis may be located in the inguinal canal. Musculoskeletal: Enlargement and heterogeneous left gluteal musculature may be related to hematoma in the setting of recent fall. There is overlying stranding of the left lateral hip and flank soft tissues. No evidence of retroperitoneal hematoma. The bones are diffusely under mineralized. Allowing for this limitation, no acute fracture of the pelvis, hip, or included ribs. There are chronic compression fractures of T11 and L2 with vertebral augmentation. Chronic compression deformity of L3, L4, and L5, not significantly changed from 06/05/2019 lumbar radiographs. IMPRESSION: 1. Enlargement and heterogeneous left gluteal musculature may be related to hematoma in the setting of recent fall. There is overlying stranding of the left lateral hip and flank soft tissues. No pelvis or hip fracture. 2. No evidence of retroperitoneal hematoma. 3. Patchy consolidation within the left greater than right lower lobes suspicious for pneumonia, including aspiration. Trace left pleural effusion. 4. Colonic diverticulosis without diverticulitis. 5. Mild splenomegaly. 6. Multiple chronic compression fractures of the lower thoracic and lumbar spine. Aortic Atherosclerosis (ICD10-I70.0) and Emphysema (ICD10-J43.9). Electronically Signed   By: Keith Rake M.D.   On: 09/25/2021 19:17   DG Chest Port 1 View  Result Date: 09/25/2021 CLINICAL DATA:   Shortness of breath EXAM: PORTABLE CHEST 1 VIEW COMPARISON:  Chest x-ray dated Sep 20, 2021 FINDINGS: Cardiac and mediastinal contours are unchanged. Bilateral reticular opacities, most prominent in the lung bases, similar prior exams and likely due to background emphysema and scarring. No new focal consolidation. Previously described right lung opacity is no longer visible. No large pleural effusion or pneumothorax. IMPRESSION: No new focal consolidation. Previously described right lung opacity is no longer visible, likely resolved atelectasis. Electronically Signed   By: Yetta Glassman M.D.   On: 09/25/2021 12:11    Yaileen Hofferber Wynetta Emery, MD How to contact the [facility] Attending or Consulting provider [place] or covering provider during after hours 7P -  7A, for this patient?  Check the care team in [facility] and look for a) attending/consulting [facility] provider listed and b) the [facility] team listed Log into [email or web address] and use [place]'s universal password to access. If you do not have the password, please contact the hospital operator. Locate the [facility] provider you are looking for under [facility] and page to a number that you can be directly reached. If you still have difficulty reaching the provider, please page the [facility] (Director on Call) for the Hospitalists listed on amion for assistance.   [facility]  If 7PM-7AM, please contact night-coverage [email or web address] Password [email or web address] 10/23/2021, 3:52 PM   LOS: 6 days

## 2021-10-24 DIAGNOSIS — R6510 Systemic inflammatory response syndrome (SIRS) of non-infectious origin without acute organ dysfunction: Secondary | ICD-10-CM | POA: Diagnosis not present

## 2021-10-24 DIAGNOSIS — T148XXA Other injury of unspecified body region, initial encounter: Secondary | ICD-10-CM | POA: Diagnosis not present

## 2021-10-24 DIAGNOSIS — D62 Acute posthemorrhagic anemia: Secondary | ICD-10-CM | POA: Diagnosis not present

## 2021-10-24 DIAGNOSIS — N1832 Chronic kidney disease, stage 3b: Secondary | ICD-10-CM | POA: Diagnosis not present

## 2021-10-24 LAB — FLOW CYTOMETRY

## 2021-10-24 MED ORDER — MORPHINE SULFATE (CONCENTRATE) 10 MG /0.5 ML PO SOLN
10.0000 mg | ORAL | 0 refills | Status: AC | PRN
Start: 2021-10-24 — End: 2021-10-29

## 2021-10-24 NOTE — TOC Transition Note (Signed)
Transition of Care [facility]) - CM/SW Discharge Note   Patient Details  Name: [name] MRN: [id number] Date of Birth: [birth year]  [facility] CM/SW Contact:  Boneta Lucks, RN Phone Number: 10/24/2021, 12:27 PM   Clinical Narrative:   Patient discharging home today with [facility]. DME has been delivered. Casandra ask TOC to set up EMS. RN updated. Medical necessity printed.    Final next level of care: Home w Hospice Care Barriers to Discharge: Barriers Resolved   Patient Goals and CMS Choice Patient states their goals for this hospitalization and ongoing recovery are:: agreeable to hospice. CMS Medicare.gov Compare Post Acute Care list provided to:: Patient Represenative (must comment) Choice offered to / list presented to : Adult Children  Discharge Placement    Name of family member notified: [name] Patient and family notified of of transfer: 10/24/21  Discharge Plan and Services In-house Referral: Clinical Social Work   Post Acute Care Choice: Hospice              Readmission Risk Interventions    10/24/2021   12:26 PM 10/24/2021    9:55 AM 10/20/2021    8:30 AM  Readmission Risk Prevention Plan  Transportation Screening Complete  Complete  Medication Review (RN Care Manager) Complete  Complete  PCP or Specialist appointment within 3-5 days of discharge Complete Complete   HRI or Home Care Consult Complete  Complete  SW Recovery Care/Counseling Consult   Complete  Palliative Care Screening   Not [place]   Complete

## 2021-10-24 NOTE — Progress Notes (Signed)
Pt given hydrocodone x2 per request this shift, reports right hip pain, effective.

## 2021-10-24 NOTE — Care Management Important Message (Signed)
Important Message  Patient Details  Name: [name] MRN: [id number] Date of Birth: [birth year]   Medicare Important Message Given:  Other (see comment)  Disposition to discharge with hospice services.  Medicare IM withheld at this time out of respect for patient and family.   Dannette Barbara 10/24/2021, 10:13 AM

## 2021-10-24 NOTE — Discharge Summary (Addendum)
Physician Discharge Summary  [name] XNA:[id number] DOB: 1929/08/06 DOA: 10/17/2021  PCP: Daneil Dolin, MD  Admit date: 10/17/2021 Discharge date: 10/24/2021  Disposition:  HOME WITH HOSPICE CARE  Recommendations for Outpatient Follow-up:  PLEASE CALL HOSPICE RN FIRST FOR ANY CONCERNS, DISTRESS, PAIN  Discharge Condition: HOSPICE   CODE STATUS: DNR DIET:  HOSPICE /COMFORT    Brief Hospitalization Summary: Please see all hospital notes, images, labs for full details of the hospitalization. 88 year old male with a history of myelodysplastic syndrome, CKD stage IIIb, COPD, GERD, chronic respiratory failure on 2 L, COPD, chronic hypotension on midodrine presented to the [facility] for routine visit today.  The patient was seen at the cancer center on 10/14/2021 secondary to hematoma and possible cellulitis around his right IJ Port-A-Cath site.  Blood cultures were drawn on that day and are currently growing methicillin-resistant Staphylococcus epidermidis.  Blood cultures on 10/12/2021 were negative.  The patient was sent back to the [facility] with cephalexin for 7-day course.  He returned to the clinic today with increasing generalized weakness, and nausea and vomiting of 1 day duration.  The patient feels that he was vomiting some blackish type material on 10/16/2021.  He denies any fevers, chills, chest pain, shortness breath, abdominal pain, dysuria, hematuria.  Notably, the patient recently had his current Port-A-Cath placed on 10/02/2021.  His previous Port-A-Cath was removed on 08/07/2021 due to hematoma and possible infection.  He was treated with Augmentin at that time. The patient noted that he has had progressive pain and swelling about his right-sided Port-A-Cath since 10/10/2021.  When comparing pictures from his previous visit on 10/14/2021, it appears that the patient's edema and hematoma may have progressed. Notably, the patient has had numerous hospital  admissions recently.  Patient was recently admitted to the hospital from 5/11 to 5/13, after he suffered a fall and developed a significant gluteal hematoma resulting in blood loss anemia.  He has been transfused multiple units of PRBCs as well as platelets to keep hemoglobin greater than 8 and platelets greater than 50,000.  He was subsequently discharged to [facility] for rehab.  In addition, he had an admission from 09/20/2021 to 09/21/2021 for symptomatic anemia.  He was transfused 2 units PRBC.Marland Kitchen  He was admitted from 08/09/2021 to 08/11/2021 secondary to bleeding from his left-sided Port-A-Cath removal site.  He was transfused 2 units PRBC at that time.  He also received 1 unit platelets.  10/23/2021: Spoke with daughter today and she has been visiting at bedside and decision made to take him home with hospice in AM. No beds available at [facility] today.    10/24/2021: pt going home with hospice today with daughters  HOSPITAL COURSE BY PROBLEM   Infected Right IJ Port-A-Cath hematoma -cultures = MRSE -General surgery consulted; discussed with Dr. Constance Haw --Port-A-Cath removal---done on 10/17/21 -Repeat blood cultures x2 sets--neg to date -follow up cultures from removed port-a-cath = MRSE -treated with IV vancomycin -plan IV vanc through 10/23/21, then switch to po abx    MRSE Bacteremia 10/14/21 blood culture = MRSE --called microbiology to work up sensitivity of 5/30 blood culture --source = port-a-cath (removed 10/17/21) Follow 10/17/21 blood culture--neg to date -he continued IV vanc through 10/23/21,now transitioning to hospice care   Coffee-ground emesis -Start pantoprazole IV twice daily>>continue 3/8 EGD--Localized hemorrhagic mucosa with active bleeding and with stigmata of bleeding was found in the proximal jejunum. Upon lavage it looked as a vascularized area, ?possible Dieulafoy  vs AVM.  Hemoclip placed -Consult GI appreciated>>continue PPI bid, treated conservatively   Acute on  chronic anemia Baseline hemoglobin with wide variation, but usually ~8 range -Transfused 2 units PRBC 10/18/21 -transfused one unit platelets 6/5   Myelodysplastic syndrome (HCC) Follows Dr. Delton Coombes -follow up with Dr. Delton Coombes states treatment of MDS on hold due to hospitalizations for syncope Previously on Azacitidine 09/16/20 to 04/21/21 --WBC continues to climb --6/5 care discussed with Dr. Delton Coombes agreeable to hospice care   Thrombocytopenia [facility]) Has been chronic due to MDS,  30-40K range   CKD (chronic kidney disease), stage IIIB Baseline creatinine 1.5-1.8   COPD (chronic obstructive pulmonary disease) Baseline 3 L oxygen requirement Continue albuterol as needed Stable   Chronic hypotension -chronically on midodrine   Fall with left gluteal hematoma -currently at [facility] for rehab Left hip hematoma appears to be involuting   Goals of Care 6/5, 6/8--long discussion with patient's daughter Stanton Kidney --She is coming back to [place] in next 1-2 days  --pt expressed he is ready for hospice at home and family now agreeable to take him home today --home hospice service and care arranged   Discharge Diagnoses:  Principal Problem:   SIRS (systemic inflammatory response syndrome) ([place]) Active Problems:   Thrombocytopenia (HCC)   Myelodysplastic syndrome (HCC)   CKD (chronic kidney disease), stage IIIB   Acute on chronic blood loss anemia   Infected hematoma   Discharge Instructions:  Allergies as of 10/24/2021   No Known Allergies      Medication List     STOP taking these medications    cephALEXin 500 MG capsule Commonly known as: KEFLEX   oxyCODONE 5 MG immediate release tablet Commonly known as: Oxy IR/ROXICODONE   traMADol 50 MG tablet Commonly known as: ULTRAM       TAKE these medications    acetaminophen 650 MG CR tablet Commonly known as: TYLENOL Take 650 mg by mouth every 6 (six) hours.   albuterol 108 (90 Base) MCG/ACT  inhaler Commonly known as: VENTOLIN HFA Inhale 2 puffs into the lungs every 4 (four) hours as needed for wheezing or shortness of breath.   Anoro Ellipta 62.5-25 MCG/ACT Aepb Generic drug: umeclidinium-vilanterol Inhale 1 puff into the lungs daily.   midodrine 5 MG tablet Commonly known as: PROAMATINE Take 1 tablet (5 mg total) by mouth 3 (three) times daily with meals.   morphine CONCENTRATE 10 mg / 0.5 ml concentrated solution Take 0.5 mLs (10 mg total) by mouth every 2 (two) hours as needed for up to 5 days for severe pain, moderate pain, anxiety or shortness of breath.   predniSONE 5 MG tablet Commonly known as: DELTASONE Take 5 mg by mouth daily with breakfast.        No Known Allergies Allergies as of 10/24/2021   No Known Allergies      Medication List     STOP taking these medications    cephALEXin 500 MG capsule Commonly known as: KEFLEX   oxyCODONE 5 MG immediate release tablet Commonly known as: Oxy IR/ROXICODONE   traMADol 50 MG tablet Commonly known as: ULTRAM       TAKE these medications    acetaminophen 650 MG CR tablet Commonly known as: TYLENOL Take 650 mg by mouth every 6 (six) hours.   albuterol 108 (90 Base) MCG/ACT inhaler Commonly known as: VENTOLIN HFA Inhale 2 puffs into the lungs every 4 (four) hours as needed for wheezing or shortness of breath.   Anoro  Ellipta 62.5-25 MCG/ACT Aepb Generic drug: umeclidinium-vilanterol Inhale 1 puff into the lungs daily.   midodrine 5 MG tablet Commonly known as: PROAMATINE Take 1 tablet (5 mg total) by mouth 3 (three) times daily with meals.   morphine CONCENTRATE 10 mg / 0.5 ml concentrated solution Take 0.5 mLs (10 mg total) by mouth every 2 (two) hours as needed for up to 5 days for severe pain, moderate pain, anxiety or shortness of breath.   predniSONE 5 MG tablet Commonly known as: DELTASONE Take 5 mg by mouth daily with breakfast.        Procedures/Studies: DG Chest Port 1  View  Result Date: 10/17/2021 CLINICAL DATA:  Removal of right IJ chest port EXAM: PORTABLE CHEST 1 VIEW COMPARISON:  Previous studies including the examination of 10/12/2021 FINDINGS: There is interval removal of right IJ chest port. There is no pleural effusion or pneumothorax. Severe emphysematous changes are noted. Increased interstitial markings are seen in both lower lung fields and right upper lung fields. There are no new infiltrates or signs of pulmonary edema. There is no significant pleural effusion or pneumothorax. There is previous vertebroplasty and compression fractures in the thoracic and upper lumbar vertebrae. IMPRESSION: COPD. Linear densities in the lower lung fields suggest scarring. There are no new infiltrates or signs of pulmonary edema. Interval removal of right IJ chest port. Electronically Signed   By: Elmer Picker M.D.   On: 10/17/2021 15:16   CT CHEST HIGH RESOLUTION  Result Date: 10/12/2021 CLINICAL DATA:  Pulmonary infiltrates, history of myelodysplastic syndrome EXAM: CT CHEST WITHOUT CONTRAST TECHNIQUE: Multidetector CT imaging of the chest was performed following the standard protocol without intravenous contrast. High resolution imaging of the lungs, as well as inspiratory and expiratory imaging, was performed. RADIATION DOSE REDUCTION: This exam was performed according to the departmental dose-optimization program which includes automated exposure control, adjustment of the mA and/or kV according to patient size and/or use of iterative reconstruction technique. COMPARISON:  Chest 09/29/2021 FINDINGS: Cardiovascular: Right chest port catheter. Aortic atherosclerosis. Normal heart size. Three-vessel coronary artery calcifications. No pericardial effusion. Mediastinum/Nodes: No enlarged mediastinal, hilar, or axillary lymph nodes. Thyroid gland, trachea, and esophagus demonstrate no significant findings. Lungs/Pleura: Moderate centrilobular and paraseptal emphysema.  Improved irregular and heterogeneous ground-glass airspace opacity throughout the lung bases, with associated bronchiolar plugging. No significant air trapping on expiratory phase imaging. No pleural effusion or pneumothorax. Upper Abdomen: No acute abnormality. Musculoskeletal: No chest wall abnormality. No suspicious osseous lesions identified. Multiple wedge deformities throughout the thoracic spine, unchanged, status post vertebral cement augmentation of T11. IMPRESSION: 1. Improved irregular and heterogeneous ground-glass airspace opacity throughout the lung bases, with associated bronchiolar plugging. Findings are consistent with improved infection or aspiration. 2. There may be some degree of underlying scarring and or fibrosis of the lung bases, however this is difficult to assess in the setting of superimposed acute airspace disease. There are no specific features to distinguish the possibility of fibrotic interstitial lung disease from bland scarring. 3. Emphysema. 4. Coronary artery disease. Aortic Atherosclerosis (ICD10-I70.0) and Emphysema (ICD10-J43.9). Electronically Signed   By: Delanna Ahmadi M.D.   On: 10/12/2021 12:40   DG Chest Port 1 View  Result Date: 10/12/2021 CLINICAL DATA:  Short of breath since last night. Infection at the Port-A-Cath site. History of COPD. History of bone carcinoma. EXAM: PORTABLE CHEST 1 VIEW COMPARISON:  10/02/2021 and older studies. High-resolution chest CT, 10/10/2021. FINDINGS: Cardiac silhouette is normal in size. No mediastinal or hilar masses. Right  anterior chest wall, internal jugular, Port-A-Cath is stable. Lungs show diffuse irregular interstitial thickening consistent with fibrosis with underlying emphysema, stable. No lung consolidation. No convincing pulmonary edema. No pleural effusion or pneumothorax. No acute skeletal abnormality. Previous vertebral fractures treated with vertebroplasty. Skeletal structures are diffusely demineralized. IMPRESSION: 1.  No acute cardiopulmonary disease. 2. Chronic lung findings Electronically Signed   By: Lajean Manes M.D.   On: 10/12/2021 11:54   DG HIP UNILAT WITH PELVIS 2-3 VIEWS LEFT  Result Date: 10/07/2021 CLINICAL DATA:  Pain in left hip. EXAM: DG HIP (WITH OR WITHOUT PELVIS) 2-3V LEFT COMPARISON:  09/29/2021 FINDINGS: The bones appear diffusely osteopenic. No acute fracture or dislocation. Left hip appears intact. Moderate stool burden identified within the colon. IMPRESSION: 1. No acute findings. Electronically Signed   By: Kerby Moors M.D.   On: 10/07/2021 13:55   DG Chest Port 1 View  Result Date: 10/02/2021 CLINICAL DATA:  Postop port placement EXAM: PORTABLE CHEST 1 VIEW COMPARISON:  Chest radiograph 09/25/2021 FINDINGS: There is a new right chest wall port in place with tip terminating in the mid SVC. The cardiomediastinal silhouette is stable. There is extensive coarsened interstitial markings throughout both lungs. There is a new small left pleural effusion with adjacent atelectasis. There is no other new or worsening focal airspace disease. There is no significant right pleural effusion. There is no pneumothorax The bones are stable. IMPRESSION: 1. New right chest wall port in place with the tip terminating in the mid SVC. 2. New small left pleural effusion with adjacent atelectasis or pneumonia. Electronically Signed   By: Valetta Mole M.D.   On: 10/02/2021 11:44   DG C-Arm 1-60 Min-No Report  Result Date: 10/02/2021 Fluoroscopy was utilized by the requesting physician.  No radiographic interpretation.   CT CHEST ABDOMEN PELVIS WO CONTRAST  Result Date: 09/29/2021 CLINICAL DATA:  Dizziness, fall, initial encounter. EXAM: CT CHEST, ABDOMEN AND PELVIS WITHOUT CONTRAST TECHNIQUE: Multidetector CT imaging of the chest, abdomen and pelvis was performed following the standard protocol without IV contrast. RADIATION DOSE REDUCTION: This exam was performed according to the departmental  dose-optimization program which includes automated exposure control, adjustment of the mA and/or kV according to patient size and/or use of iterative reconstruction technique. COMPARISON:  CT abdomen pelvis 09/25/2021 and CT chest 02/15/2021. FINDINGS: CT CHEST FINDINGS Cardiovascular: Atherosclerotic calcification of the aorta, aortic valve and coronary arteries. There may be papillary muscle calcifications in the left ventricle. Decreased attenuation of the intravascular compartment is indicative of anemia. Heart size within normal limits. No pericardial effusion. Mediastinum/Nodes: No pathologically enlarged mediastinal or axillary lymph nodes. Hilar regions are difficult to evaluate without IV contrast. Esophagus is grossly unremarkable. Lungs/Pleura: Centrilobular and paraseptal emphysema. Biapical pleuroparenchymal scarring. Dependent patchy ground-glass, septal thickening and areas of consolidation in the lower lobes, progressive from 09/25/2021. Mild patchy ground-glass in the posterior aspect of the upper lobes appears chronic. No pleural fluid. Adherent debris in the airway. Musculoskeletal: Osteopenia. Vertebral body augmentation. Old bilateral rib fractures. Numerous thoracic compression deformities with kyphosis, as on 02/15/2021. CT ABDOMEN PELVIS FINDINGS Hepatobiliary: Liver and gallbladder are unremarkable. Similar biliary ductal dilatation. Pancreas: Negative. Spleen: Negative. Adrenals/Urinary Tract: Adrenal glands are unremarkable. 5.9 cm cyst in the lower pole right kidney. Additional low and high attenuation lesions in the kidneys measure up to 1.8 cm and are likely cysts or too small to characterize. No specific follow-up necessary. Ureters are decompressed. Bladder is grossly unremarkable. Stomach/Bowel: Stomach, small bowel, appendix and colon are  unremarkable. Vascular/Lymphatic: Atherosclerotic calcification of the aorta. No pathologically enlarged lymph nodes. Reproductive: Prostate is  visualized and contains calcifications. Other: Mild presacral soft tissue thickening, unchanged. No free fluid. Mesenteries and peritoneum are unremarkable. Musculoskeletal: Old right inferior pubic ramus fracture. No acute fracture. Degenerative changes in the spine. L1 vertebral body augmentation. L3, L4 and L5 compression fractures as on 09/25/2021. Large left gluteal hematoma measures approximately 5.0 x 10.2 cm, new from 09/25/2021. Pre-existing inferior left gluteal hematoma (2/121), similar to minimally enlarged. Overlying edema. IMPRESSION: 1. New and enlarging left gluteal hematomas. No underlying acute fracture. 2. Bilateral lower lobe ground-glass, septal thickening and areas of consolidation, worsening in the interval from 09/25/2021, worrisome for progressive pneumonia. 3. Aortic atherosclerosis (ICD10-I70.0). Coronary artery calcifications. 4.  Emphysema (ICD10-J43.9). Electronically Signed   By: Lorin Picket M.D.   On: 09/29/2021 12:13   CT T-SPINE NO CHARGE  Result Date: 09/29/2021 CLINICAL DATA:  Fall EXAM: CT THORACIC AND LUMBAR SPINE WITHOUT CONTRAST TECHNIQUE: Multidetector CT imaging of the thoracic and lumbar spine was performed without contrast. Multiplanar CT image reconstructions were also generated. RADIATION DOSE REDUCTION: This exam was performed according to the departmental dose-optimization program which includes automated exposure control, adjustment of the mA and/or kV according to patient size and/or use of iterative reconstruction technique. COMPARISON:  CT abdomen pelvis 09/25/2021, CT chest 02/15/2021, CT L-spine 06/02/2019 FINDINGS: CT THORACIC SPINE FINDINGS Alignment: Mild S shaped curvature. Exaggeration of the normal thoracic kyphosis in part secondary to chronic compression deformities of T6-T8). No listhesis. Vertebrae: No acute fracture or suspicious osseous lesion. Unchanged compression deformities of T6, T7, T8, T9, and T11, with redemonstrated kyphoplasty in  T11. Paraspinal and other soft tissues: Please see same-day CT chest abdomen pelvis. Disc levels: No high-grade spinal canal stenosis. Moderate bilateral neural foraminal narrowing at T10-T11 and T11-T12. CT LUMBAR SPINE FINDINGS Segmentation: 5 lumbar type vertebral bodies. Partial sacralization of L5 with bilateral L5-S1 pseudoarticulation of broadened left-greater-than-right L5 transverse processes with the sacral ala. Alignment: Mild S shaped curvature of the thoracolumbar spine. Straightening and mild reversal of the normal lumbar lordosis. No significant listhesis. Vertebrae: No acute fracture or suspicious osseous lesion. Redemonstrated compression deformities at L2, L3, L4, and L5, with redemonstrated kyphoplasty at L2. Paraspinal and other soft tissues: Please see same-day CT chest abdomen pelvis. Disc levels: Redemonstrated degenerative changes, with mild spinal canal stenosis at L2-L3 and L3-L4. No significant neural foraminal narrowing. IMPRESSION: No acute fracture or traumatic listhesis in the thoracic spine or lumbar spine. Unchanged multilevel compression deformities, status post kyphoplasty at T11 and T12. Please see same-day CT chest abdomen pelvis for soft tissue findings. Electronically Signed   By: Merilyn Baba M.D.   On: 09/29/2021 12:09   CT L-SPINE NO CHARGE  Result Date: 09/29/2021 CLINICAL DATA:  Fall EXAM: CT THORACIC AND LUMBAR SPINE WITHOUT CONTRAST TECHNIQUE: Multidetector CT imaging of the thoracic and lumbar spine was performed without contrast. Multiplanar CT image reconstructions were also generated. RADIATION DOSE REDUCTION: This exam was performed according to the departmental dose-optimization program which includes automated exposure control, adjustment of the mA and/or kV according to patient size and/or use of iterative reconstruction technique. COMPARISON:  CT abdomen pelvis 09/25/2021, CT chest 02/15/2021, CT L-spine 06/02/2019 FINDINGS: CT THORACIC SPINE FINDINGS  Alignment: Mild S shaped curvature. Exaggeration of the normal thoracic kyphosis in part secondary to chronic compression deformities of T6-T8). No listhesis. Vertebrae: No acute fracture or suspicious osseous lesion. Unchanged compression deformities of T6, T7, T8, T9, and  T11, with redemonstrated kyphoplasty in T11. Paraspinal and other soft tissues: Please see same-day CT chest abdomen pelvis. Disc levels: No high-grade spinal canal stenosis. Moderate bilateral neural foraminal narrowing at T10-T11 and T11-T12. CT LUMBAR SPINE FINDINGS Segmentation: 5 lumbar type vertebral bodies. Partial sacralization of L5 with bilateral L5-S1 pseudoarticulation of broadened left-greater-than-right L5 transverse processes with the sacral ala. Alignment: Mild S shaped curvature of the thoracolumbar spine. Straightening and mild reversal of the normal lumbar lordosis. No significant listhesis. Vertebrae: No acute fracture or suspicious osseous lesion. Redemonstrated compression deformities at L2, L3, L4, and L5, with redemonstrated kyphoplasty at L2. Paraspinal and other soft tissues: Please see same-day CT chest abdomen pelvis. Disc levels: Redemonstrated degenerative changes, with mild spinal canal stenosis at L2-L3 and L3-L4. No significant neural foraminal narrowing. IMPRESSION: No acute fracture or traumatic listhesis in the thoracic spine or lumbar spine. Unchanged multilevel compression deformities, status post kyphoplasty at T11 and T12. Please see same-day CT chest abdomen pelvis for soft tissue findings. Electronically Signed   By: Merilyn Baba M.D.   On: 09/29/2021 12:09   CT Head Wo Contrast  Result Date: 09/29/2021 CLINICAL DATA:  Head trauma, minor (Age >= 65y) EXAM: CT HEAD WITHOUT CONTRAST TECHNIQUE: Contiguous axial images were obtained from the base of the skull through the vertex without intravenous contrast. RADIATION DOSE REDUCTION: This exam was performed according to the departmental dose-optimization  program which includes automated exposure control, adjustment of the mA and/or kV according to patient size and/or use of iterative reconstruction technique. COMPARISON:  June 2022 FINDINGS: Brain: There is no acute intracranial hemorrhage, mass effect, or edema. Gray-white differentiation is preserved. There is no extra-axial fluid collection. Prominence of the ventricles and sulci reflects similar parenchymal volume loss. Patchy hypoattenuation in the supratentorial white matter is nonspecific but probably reflects similar chronic microvascular ischemic changes. Vascular: There is atherosclerotic calcification at the skull base. Skull: Calvarium is unremarkable. Sinuses/Orbits: No acute finding. Other: None. IMPRESSION: No evidence of acute intracranial injury. Electronically Signed   By: Macy Mis M.D.   On: 09/29/2021 12:04   CT Cervical Spine Wo Contrast  Result Date: 09/29/2021 CLINICAL DATA:  Neck trauma (Age >= 65y) EXAM: CT CERVICAL SPINE WITHOUT CONTRAST TECHNIQUE: Multidetector CT imaging of the cervical spine was performed without intravenous contrast. Multiplanar CT image reconstructions were also generated. RADIATION DOSE REDUCTION: This exam was performed according to the departmental dose-optimization program which includes automated exposure control, adjustment of the mA and/or kV according to patient size and/or use of iterative reconstruction technique. COMPARISON:  None Available. FINDINGS: Alignment: Trace retrolisthesis at C4-C5. Skull base and vertebrae: Decreased osseous mineralization. Degenerative endplate irregularity. No acute fracture. Soft tissues and spinal canal: No prevertebral fluid or swelling. No visible canal hematoma. Disc levels: Multilevel degenerative changes are present including disc space narrowing, endplate osteophytes, and facet and uncovertebral hypertrophy. Upper chest: Dictated separately. Other: Mild calcified plaque at the common carotid bifurcations.  IMPRESSION: No acute cervical spine fracture. Electronically Signed   By: Macy Mis M.D.   On: 09/29/2021 11:59   DG Hip Unilat W or Wo Pelvis 2-3 Views Left  Result Date: 09/29/2021 CLINICAL DATA:  Fall. Tenderness along the left hip with bruising of the left posterior thigh. EXAM: DG HIP (WITH OR WITHOUT PELVIS) 2-3V LEFT COMPARISON:  CT pelvis 09/25/2021 FINDINGS: Bony demineralization. Chronic compression fractures at L4 and L5. No definite hip fracture identified. Dense prostate gland calcifications. There is irregularity of the right inferior pubic ramus suspicious  for a fracture. Fracture versus accentuated trabeculation laterally in the right superior pubic ramus. IMPRESSION: 1. The left proximal femur appears intact, there is suspicion for potential fractures of the right (contralateral) inferior pubic ramus and possibly superior pubic ramus. This raises the possibility of other fractures in the bony pelvis which might be occult radiographically partially due to the patient's bony demineralization. Special attention to the bony pelvis is recommended on the pelvis CT scan ordered for today. 2. Chronic compression fractures at L4 and L5. Electronically Signed   By: Van Clines M.D.   On: 09/29/2021 10:24   CT ABDOMEN PELVIS WO CONTRAST  Result Date: 09/25/2021 CLINICAL DATA:  Weakness and anemia.  Fall on left side 09/22/2021 EXAM: CT ABDOMEN AND PELVIS WITHOUT CONTRAST TECHNIQUE: Multidetector CT imaging of the abdomen and pelvis was performed following the standard protocol without IV contrast. RADIATION DOSE REDUCTION: This exam was performed according to the departmental dose-optimization program which includes automated exposure control, adjustment of the mA and/or kV according to patient size and/or use of iterative reconstruction technique. COMPARISON:  CT 09/23/2016 FINDINGS: Lower chest: Basilar emphysema. Patchy consolidation within the left greater than right lower lobes  suspicious for pneumonia, including aspiration. Trace left pleural effusion. Hepatobiliary: There is no evidence of focal hepatic abnormality on this unenhanced exam. Gallbladder physiologically distended, no calcified stone. No biliary dilatation. Pancreas: No ductal dilatation or inflammation. Normal for age pancreatic atrophy. Spleen: The spleen measures 12.1 x 7.7 x 9.8 cm (volume = 480 cm^3), mildly enlarged. No focal abnormality or perisplenic fluid on this unenhanced exam. Adrenals/Urinary Tract: No adrenal nodule. There is no hydronephrosis. There is left renal parenchymal thinning. Multiple bilateral cysts of varying sizes in density, incompletely characterized on this unenhanced exam. These are grossly unchanged from 2018 exam, and no specific imaging follow-up is recommended. Unremarkable urinary bladder. Stomach/Bowel: There is ingested material distending the stomach. Occasional fluid-filled small bowel without obstruction or inflammation. Moderate volume of colonic stool. No colonic wall thickening or inflammatory change. Left colonic diverticulosis, prominent in the sigmoid, no diverticulitis. Vascular/Lymphatic: Aortic atherosclerosis. No aortic aneurysm. There is no periaortic stranding. No retroperitoneal fluid. No bulky abdominopelvic adenopathy. Reproductive: Prostatic calcifications. Other: There is soft tissue edema involving the greater than right flank subcutaneous tissues dependently, as well as midline skin thickening. Edematous changes extend over the left hip where there is also enlargement of the left gluteus muscles as described below. There is no free air or free fluid within the abdomen or pelvis. The right testis may be located in the inguinal canal. Musculoskeletal: Enlargement and heterogeneous left gluteal musculature may be related to hematoma in the setting of recent fall. There is overlying stranding of the left lateral hip and flank soft tissues. No evidence of retroperitoneal  hematoma. The bones are diffusely under mineralized. Allowing for this limitation, no acute fracture of the pelvis, hip, or included ribs. There are chronic compression fractures of T11 and L2 with vertebral augmentation. Chronic compression deformity of L3, L4, and L5, not significantly changed from 06/05/2019 lumbar radiographs. IMPRESSION: 1. Enlargement and heterogeneous left gluteal musculature may be related to hematoma in the setting of recent fall. There is overlying stranding of the left lateral hip and flank soft tissues. No pelvis or hip fracture. 2. No evidence of retroperitoneal hematoma. 3. Patchy consolidation within the left greater than right lower lobes suspicious for pneumonia, including aspiration. Trace left pleural effusion. 4. Colonic diverticulosis without diverticulitis. 5. Mild splenomegaly. 6. Multiple chronic compression fractures of  the lower thoracic and lumbar spine. Aortic Atherosclerosis (ICD10-I70.0) and Emphysema (ICD10-J43.9). Electronically Signed   By: Keith Rake M.D.   On: 09/25/2021 19:17   DG Chest Port 1 View  Result Date: 09/25/2021 CLINICAL DATA:  Shortness of breath EXAM: PORTABLE CHEST 1 VIEW COMPARISON:  Chest x-ray dated Sep 20, 2021 FINDINGS: Cardiac and mediastinal contours are unchanged. Bilateral reticular opacities, most prominent in the lung bases, similar prior exams and likely due to background emphysema and scarring. No new focal consolidation. Previously described right lung opacity is no longer visible. No large pleural effusion or pneumothorax. IMPRESSION: No new focal consolidation. Previously described right lung opacity is no longer visible, likely resolved atelectasis. Electronically Signed   By: Yetta Glassman M.D.   On: 09/25/2021 12:11     Subjective: Pt says that he is "ready" and he wants to go home with hospice.  He is hurting but doesn't want to ask for pain meds at this time.    Discharge Exam: Vitals:   10/24/21 0342 10/24/21  0752  BP: 102/60   Pulse: 86   Resp: 15   Temp: 97.7 F (36.5 C)   SpO2: 96% 96%   Vitals:   10/23/21 1305 10/23/21 2137 10/24/21 0342 10/24/21 0752  BP: 115/61 (!) 103/57 102/60   Pulse: 85 86 86   Resp: '18 19 15   '$ Temp: 98 F (36.7 C) 98.8 F (37.1 C) 97.7 F (36.5 C)   TempSrc:  Oral Oral   SpO2: 96% 90% 96% 96%  Weight:      Height:        General:  frail, pale, chronically ill appearing, he appears comfortable lying in bed, NAD.  Cooperative.   HEENT: supple neck, no thyroid nodules or masses palpated.  Cardiovascular: normal s1, s2 sounds.  No MRG Respiratory:  mild increased work of breathing.  Abdomen: soft, NDNT, no HSM.  Extremities: no CCE.    The results of significant diagnostics from this hospitalization (including imaging, microbiology, ancillary and laboratory) are listed below for reference.     Microbiology: Recent Results (from the past 240 hour(s))  Culture, blood (routine x 2)     Status: Abnormal   Collection Time: 10/14/21  3:12 PM   Specimen: Left Antecubital; Blood  Result Value Ref Range Status   Specimen Description   Final    LEFT ANTECUBITAL Performed at [facility], [address], [place], [place] [address]    Special Requests   Final    BOTTLES DRAWN AEROBIC AND ANAEROBIC Blood Culture adequate volume Performed at [place] [place], [place] [address]    Culture  Setup Time   Final    GRAM POSITIVE COCCI Gram Stain Report Called to,Read Back By and Verified With: T MYERS [id number] K FORSYTH AEROBIC BOTTLE ONLY CRITICAL RESULT CALLED TO, READ BACK BY AND VERIFIED WITH: RN Bonney Leitz [phone] '@2034'$  FH Performed at [facility], [place] [address] [place], [place] [address]    Culture STAPHYLOCOCCUS EPIDERMIDIS (A)  Final   Report Status 10/21/2021 FINAL  Final   Organism ID, Bacteria STAPHYLOCOCCUS EPIDERMIDIS  Final      Susceptibility   Staphylococcus epidermidis - MIC*    CIPROFLOXACIN >=8 RESISTANT  Resistant     ERYTHROMYCIN >=8 RESISTANT Resistant     GENTAMICIN <=0.5 SENSITIVE Sensitive     OXACILLIN >=4 RESISTANT Resistant     TETRACYCLINE 2 SENSITIVE Sensitive     VANCOMYCIN <=0.5 SENSITIVE Sensitive  TRIMETH/SULFA 80 RESISTANT Resistant     CLINDAMYCIN >=8 RESISTANT Resistant     RIFAMPIN <=0.5 SENSITIVE Sensitive     Inducible Clindamycin NEGATIVE Sensitive     * STAPHYLOCOCCUS EPIDERMIDIS  Blood Culture ID Panel (Reflexed)     Status: Abnormal   Collection Time: 10/14/21  3:12 PM  Result Value Ref Range Status   Enterococcus faecalis NOT DETECTED NOT DETECTED Final   Enterococcus Faecium NOT DETECTED NOT DETECTED Final   Listeria monocytogenes NOT DETECTED NOT DETECTED Final   Staphylococcus species DETECTED (A) NOT DETECTED Final    Comment: CRITICAL RESULT CALLED TO, READ BACK BY AND VERIFIED WITH: RN Ladell Pier [phone] '@2034'$  FH    Staphylococcus aureus (BCID) NOT DETECTED NOT DETECTED Final   Staphylococcus epidermidis DETECTED (A) NOT DETECTED Final    Comment: Methicillin (oxacillin) resistant coagulase negative staphylococcus. Possible blood culture contaminant (unless isolated from more than one blood culture draw or clinical case suggests pathogenicity). No antibiotic treatment is indicated for blood  culture contaminants. CRITICAL RESULT CALLED TO, READ BACK BY AND VERIFIED WITH: RN Bonney Leitz [phone] '@2034'$  FH    Staphylococcus lugdunensis NOT DETECTED NOT DETECTED Final   Streptococcus species NOT DETECTED NOT DETECTED Final   Streptococcus agalactiae NOT DETECTED NOT DETECTED Final   Streptococcus pneumoniae NOT DETECTED NOT DETECTED Final   Streptococcus pyogenes NOT DETECTED NOT DETECTED Final   A.calcoaceticus-baumannii NOT DETECTED NOT DETECTED Final   Bacteroides fragilis NOT DETECTED NOT DETECTED Final   Enterobacterales NOT DETECTED NOT DETECTED Final   Enterobacter cloacae complex NOT DETECTED NOT DETECTED Final   Escherichia coli NOT DETECTED NOT  DETECTED Final   Klebsiella aerogenes NOT DETECTED NOT DETECTED Final   Klebsiella oxytoca NOT DETECTED NOT DETECTED Final   Klebsiella pneumoniae NOT DETECTED NOT DETECTED Final   Proteus species NOT DETECTED NOT DETECTED Final   Salmonella species NOT DETECTED NOT DETECTED Final   Serratia marcescens NOT DETECTED NOT DETECTED Final   Haemophilus influenzae NOT DETECTED NOT DETECTED Final   Neisseria meningitidis NOT DETECTED NOT DETECTED Final   Pseudomonas aeruginosa NOT DETECTED NOT DETECTED Final   Stenotrophomonas maltophilia NOT DETECTED NOT DETECTED Final   Candida albicans NOT DETECTED NOT DETECTED Final   Candida auris NOT DETECTED NOT DETECTED Final   Candida glabrata NOT DETECTED NOT DETECTED Final   Candida krusei NOT DETECTED NOT DETECTED Final   Candida parapsilosis NOT DETECTED NOT DETECTED Final   Candida tropicalis NOT DETECTED NOT DETECTED Final   Cryptococcus neoformans/gattii NOT DETECTED NOT DETECTED Final   Methicillin resistance mecA/C DETECTED (A) NOT DETECTED Final    Comment: CRITICAL RESULT CALLED TO, READ BACK BY AND VERIFIED WITH: RN Ladell Pier [phone] '@2034'$  FH Performed at [facility], [address], [place], [place] [address]   Aerobic/Anaerobic Culture w Gram Stain (surgical/deep wound)     Status: None   Collection Time: 10/17/21 11:57 AM   Specimen: Catheter Tip  Result Value Ref Range Status   Specimen Description   Final    CATH TIP Performed at [facility], [address] [place], [place] [address]    Special Requests   Final    Immunocompromised Performed at [facility], [address], [place], [place] [address]    Gram Stain NO WBC SEEN NO ORGANISMS SEEN   Final   Culture   Final    RARE STAPHYLOCOCCUS EPIDERMIDIS NO ANAEROBES ISOLATED Performed at [facility], [place] [address] [place], [place] [address]  Report Status 10/22/2021 FINAL  Final   Organism ID, Bacteria STAPHYLOCOCCUS EPIDERMIDIS  Final       Susceptibility   Staphylococcus epidermidis - MIC*    CIPROFLOXACIN >=8 RESISTANT Resistant     ERYTHROMYCIN >=8 RESISTANT Resistant     GENTAMICIN <=0.5 SENSITIVE Sensitive     OXACILLIN >=4 RESISTANT Resistant     TETRACYCLINE 2 SENSITIVE Sensitive     VANCOMYCIN 1 SENSITIVE Sensitive     TRIMETH/SULFA 80 RESISTANT Resistant     CLINDAMYCIN >=8 RESISTANT Resistant     RIFAMPIN <=0.5 SENSITIVE Sensitive     Inducible Clindamycin NEGATIVE Sensitive     * RARE STAPHYLOCOCCUS EPIDERMIDIS  Aerobic/Anaerobic Culture w Gram Stain (surgical/deep wound)     Status: None   Collection Time: 10/17/21 11:58 AM   Specimen: Wound  Result Value Ref Range Status   Specimen Description   Final    WOUND HEMATOMA Performed at [facility], [address], [place], [place] [address]    Special Requests   Final    Immunocompromised Performed at [facility], [address], [place], [place] [address]    Gram Stain   Final    ABUNDANT WBC PRESENT, PREDOMINANTLY PMN NO ORGANISMS SEEN    Culture   Final    FEW STAPHYLOCOCCUS EPIDERMIDIS NO ANAEROBES ISOLATED Performed at [facility], [place] [address], [place], [place] [address]    Report Status 10/22/2021 FINAL  Final   Organism ID, Bacteria STAPHYLOCOCCUS EPIDERMIDIS  Final      Susceptibility   Staphylococcus epidermidis - MIC*    CIPROFLOXACIN >=8 RESISTANT Resistant     ERYTHROMYCIN >=8 RESISTANT Resistant     GENTAMICIN <=0.5 SENSITIVE Sensitive     OXACILLIN >=4 RESISTANT Resistant     TETRACYCLINE 2 SENSITIVE Sensitive     VANCOMYCIN 1 SENSITIVE Sensitive     TRIMETH/SULFA 80 RESISTANT Resistant     CLINDAMYCIN >=8 RESISTANT Resistant     RIFAMPIN <=0.5 SENSITIVE Sensitive     Inducible Clindamycin NEGATIVE Sensitive     * FEW STAPHYLOCOCCUS EPIDERMIDIS  MRSA Next Gen by PCR, Nasal     Status: None   Collection Time: 10/17/21 12:11 PM   Specimen: Nasal Mucosa; Nasal Swab  Result Value Ref Range Status   MRSA by PCR Next Gen  NOT DETECTED NOT DETECTED Final    Comment: (NOTE) The GeneXpert MRSA Assay (FDA approved for NASAL specimens only), is one component of a comprehensive MRSA colonization surveillance program. It is not intended to diagnose MRSA infection nor to guide or monitor treatment for MRSA infections. Test performance is not FDA approved in patients less than 85 years old. Performed at [facility], [address] [place], [place] [address]   Culture, blood (Routine X 2) w Reflex to ID Panel     Status: None   Collection Time: 10/17/21  2:33 PM   Specimen: BLOOD LEFT HAND  Result Value Ref Range Status   Specimen Description BLOOD LEFT HAND  Final   Special Requests   Final    BOTTLES DRAWN AEROBIC AND ANAEROBIC Blood Culture adequate volume   Culture   Final    NO GROWTH 5 DAYS Performed at [facility], [address] [place], [place] [address]    Report Status 10/22/2021 FINAL  Final  Culture, blood (Routine X 2) w Reflex to ID Panel     Status: None   Collection Time: 10/17/21  2:33 PM   Specimen: BLOOD RIGHT HAND  Result Value Ref Range Status   Specimen Description BLOOD RIGHT HAND  Final   Special Requests   Final    BOTTLES DRAWN AEROBIC AND ANAEROBIC Blood Culture adequate volume   Culture   Final    NO GROWTH 5 DAYS Performed at [facility], [address], [place], [place] [address]    Report Status 10/22/2021 FINAL  Final     Labs: BNP (last 3 results) Recent Labs    02/15/21 1946 06/16/21 1135  BNP 231.0* 42.7   Basic Metabolic Panel: Recent Labs  Lab 10/17/21 1433 10/18/21 0229 10/20/21 0501  NA 135 135 134*  K 4.3 3.9 3.1*  CL 108 111 110  CO2 22 21* 19*  GLUCOSE 102* 87 91  BUN 42* 42* 33*  CREATININE 2.20* 2.19* 1.86*  CALCIUM 9.1 8.3* 8.9  MG  --  1.8  --    Liver Function Tests: No results for input(s): "AST", "ALT", "ALKPHOS", "BILITOT", "PROT", "ALBUMIN" in the last 168 hours. No results for input(s): "LIPASE", "AMYLASE" in the last 168  hours. No results for input(s): "AMMONIA" in the last 168 hours. CBC: Recent Labs  Lab 10/18/21 0229 10/18/21 1410 10/19/21 1451 10/20/21 0501 10/21/21 0504  WBC 44.8*  --  40.4* 36.7* 37.9*  NEUTROABS  --   --   --  23.5*  --   HGB 6.3* 10.0* 10.3* 9.5* 8.9*  8.9*  HCT 20.6* 31.6* 31.2* 28.9* 27.2*  27.2*  MCV 84.4  --  85.5 85.5 86.1  PLT 62*  --  35* 31* 43*   Cardiac Enzymes: No results for input(s): "CKTOTAL", "CKMB", "CKMBINDEX", "TROPONINI" in the last 168 hours. BNP: Invalid input(s): "POCBNP" CBG: No results for input(s): "GLUCAP" in the last 168 hours. D-Dimer No results for input(s): "DDIMER" in the last 72 hours. Hgb A1c No results for input(s): "HGBA1C" in the last 72 hours. Lipid Profile No results for input(s): "CHOL", "HDL", "LDLCALC", "TRIG", "CHOLHDL", "LDLDIRECT" in the last 72 hours. Thyroid function studies No results for input(s): "TSH", "T4TOTAL", "T3FREE", "THYROIDAB" in the last 72 hours.  Invalid input(s): "FREET3" Anemia work up No results for input(s): "VITAMINB12", "FOLATE", "FERRITIN", "TIBC", "IRON", "RETICCTPCT" in the last 72 hours. Urinalysis    Component Value Date/Time   COLORURINE YELLOW 10/17/2021 1351   APPEARANCEUR HAZY (A) 10/17/2021 1351   APPEARANCEUR Hazy (A) 03/26/2021 1334   LABSPEC 1.015 10/17/2021 1351   PHURINE 5.0 10/17/2021 1351   GLUCOSEU NEGATIVE 10/17/2021 1351   HGBUR MODERATE (A) 10/17/2021 1351   BILIRUBINUR NEGATIVE 10/17/2021 1351   BILIRUBINUR Negative 03/26/2021 1334   KETONESUR NEGATIVE 10/17/2021 1351   PROTEINUR 100 (A) 10/17/2021 1351   UROBILINOGEN 0.2 08/15/2013 1240   NITRITE NEGATIVE 10/17/2021 1351   LEUKOCYTESUR NEGATIVE 10/17/2021 1351   Sepsis Labs Recent Labs  Lab 10/18/21 0229 10/19/21 1451 10/20/21 0501 10/21/21 0504  WBC 44.8* 40.4* 36.7* 37.9*   Microbiology Recent Results (from the past 240 hour(s))  Culture, blood (routine x 2)     Status: Abnormal   Collection Time:  10/14/21  3:12 PM   Specimen: Left Antecubital; Blood  Result Value Ref Range Status   Specimen Description   Final    LEFT ANTECUBITAL Performed at [facility], [address] [place], [place] [address]    Special Requests   Final    BOTTLES DRAWN AEROBIC AND ANAEROBIC Blood Culture adequate volume Performed at [facility], [address], [place], [place] [address]    Culture  Setup Time  Final    GRAM POSITIVE COCCI Gram Stain Report Called to,Read Back By and Verified With: T MYERS [id number] K FORSYTH AEROBIC BOTTLE ONLY CRITICAL RESULT CALLED TO, READ BACK BY AND VERIFIED WITH: RN Bonney Leitz [phone] '@2034'$  FH Performed at [facility], [place] [address], [place], [place] [address]    Culture STAPHYLOCOCCUS EPIDERMIDIS (A)  Final   Report Status 10/21/2021 FINAL  Final   Organism ID, Bacteria STAPHYLOCOCCUS EPIDERMIDIS  Final      Susceptibility   Staphylococcus epidermidis - MIC*    CIPROFLOXACIN >=8 RESISTANT Resistant     ERYTHROMYCIN >=8 RESISTANT Resistant     GENTAMICIN <=0.5 SENSITIVE Sensitive     OXACILLIN >=4 RESISTANT Resistant     TETRACYCLINE 2 SENSITIVE Sensitive     VANCOMYCIN <=0.5 SENSITIVE Sensitive     TRIMETH/SULFA 80 RESISTANT Resistant     CLINDAMYCIN >=8 RESISTANT Resistant     RIFAMPIN <=0.5 SENSITIVE Sensitive     Inducible Clindamycin NEGATIVE Sensitive     * STAPHYLOCOCCUS EPIDERMIDIS  Blood Culture ID Panel (Reflexed)     Status: Abnormal   Collection Time: 10/14/21  3:12 PM  Result Value Ref Range Status   Enterococcus faecalis NOT DETECTED NOT DETECTED Final   Enterococcus Faecium NOT DETECTED NOT DETECTED Final   Listeria monocytogenes NOT DETECTED NOT DETECTED Final   Staphylococcus species DETECTED (A) NOT DETECTED Final    Comment: CRITICAL RESULT CALLED TO, READ BACK BY AND VERIFIED WITH: RN Ladell Pier [phone] '@2034'$  FH    Staphylococcus aureus (BCID) NOT DETECTED NOT DETECTED Final   Staphylococcus epidermidis DETECTED (A) NOT  DETECTED Final    Comment: Methicillin (oxacillin) resistant coagulase negative staphylococcus. Possible blood culture contaminant (unless isolated from more than one blood culture draw or clinical case suggests pathogenicity). No antibiotic treatment is indicated for blood  culture contaminants. CRITICAL RESULT CALLED TO, READ BACK BY AND VERIFIED WITH: RN Bonney Leitz [phone] '@2034'$  FH    Staphylococcus lugdunensis NOT DETECTED NOT DETECTED Final   Streptococcus species NOT DETECTED NOT DETECTED Final   Streptococcus agalactiae NOT DETECTED NOT DETECTED Final   Streptococcus pneumoniae NOT DETECTED NOT DETECTED Final   Streptococcus pyogenes NOT DETECTED NOT DETECTED Final   A.calcoaceticus-baumannii NOT DETECTED NOT DETECTED Final   Bacteroides fragilis NOT DETECTED NOT DETECTED Final   Enterobacterales NOT DETECTED NOT DETECTED Final   Enterobacter cloacae complex NOT DETECTED NOT DETECTED Final   Escherichia coli NOT DETECTED NOT DETECTED Final   Klebsiella aerogenes NOT DETECTED NOT DETECTED Final   Klebsiella oxytoca NOT DETECTED NOT DETECTED Final   Klebsiella pneumoniae NOT DETECTED NOT DETECTED Final   Proteus species NOT DETECTED NOT DETECTED Final   Salmonella species NOT DETECTED NOT DETECTED Final   Serratia marcescens NOT DETECTED NOT DETECTED Final   Haemophilus influenzae NOT DETECTED NOT DETECTED Final   Neisseria meningitidis NOT DETECTED NOT DETECTED Final   Pseudomonas aeruginosa NOT DETECTED NOT DETECTED Final   Stenotrophomonas maltophilia NOT DETECTED NOT DETECTED Final   Candida albicans NOT DETECTED NOT DETECTED Final   Candida auris NOT DETECTED NOT DETECTED Final   Candida glabrata NOT DETECTED NOT DETECTED Final   Candida krusei NOT DETECTED NOT DETECTED Final   Candida parapsilosis NOT DETECTED NOT DETECTED Final   Candida tropicalis NOT DETECTED NOT DETECTED Final   Cryptococcus neoformans/gattii NOT DETECTED NOT DETECTED Final   Methicillin resistance  mecA/C DETECTED (A) NOT DETECTED Final    Comment: CRITICAL RESULT CALLED TO, READ BACK  BY AND VERIFIED WITH: RN Ladell Pier [phone] '@2034'$  FH Performed at [facility] [facility] [address] [place], [place] [address]   Aerobic/Anaerobic Culture w Gram Stain (surgical/deep wound)     Status: None   Collection Time: 10/17/21 11:57 AM   Specimen: Catheter Tip  Result Value Ref Range Status   Specimen Description   Final    CATH TIP Performed at [facility], [address], [place], [place] [address]    Special Requests   Final    Immunocompromised Performed at [facility], [address] [place], [place] [address]    Gram Stain NO WBC SEEN NO ORGANISMS SEEN   Final   Culture   Final    RARE STAPHYLOCOCCUS EPIDERMIDIS NO ANAEROBES ISOLATED Performed at [facility], [place] [address] [place], [place] [address]    Report Status 10/22/2021 FINAL  Final   Organism ID, Bacteria STAPHYLOCOCCUS EPIDERMIDIS  Final      Susceptibility   Staphylococcus epidermidis - MIC*    CIPROFLOXACIN >=8 RESISTANT Resistant     ERYTHROMYCIN >=8 RESISTANT Resistant     GENTAMICIN <=0.5 SENSITIVE Sensitive     OXACILLIN >=4 RESISTANT Resistant     TETRACYCLINE 2 SENSITIVE Sensitive     VANCOMYCIN 1 SENSITIVE Sensitive     TRIMETH/SULFA 80 RESISTANT Resistant     CLINDAMYCIN >=8 RESISTANT Resistant     RIFAMPIN <=0.5 SENSITIVE Sensitive     Inducible Clindamycin NEGATIVE Sensitive     * RARE STAPHYLOCOCCUS EPIDERMIDIS  Aerobic/Anaerobic Culture w Gram Stain (surgical/deep wound)     Status: None   Collection Time: 10/17/21 11:58 AM   Specimen: Wound  Result Value Ref Range Status   Specimen Description   Final    WOUND HEMATOMA Performed at [facility], [address] [place], [place] [address]    Special Requests   Final    Immunocompromised Performed at [facility], [address], [place], [place] [address]    Gram Stain   Final    ABUNDANT WBC PRESENT, PREDOMINANTLY PMN NO ORGANISMS  SEEN    Culture   Final    FEW STAPHYLOCOCCUS EPIDERMIDIS NO ANAEROBES ISOLATED Performed at [facility], [place] [address] [place], [place] [address]    Report Status 10/22/2021 FINAL  Final   Organism ID, Bacteria STAPHYLOCOCCUS EPIDERMIDIS  Final      Susceptibility   Staphylococcus epidermidis - MIC*    CIPROFLOXACIN >=8 RESISTANT Resistant     ERYTHROMYCIN >=8 RESISTANT Resistant     GENTAMICIN <=0.5 SENSITIVE Sensitive     OXACILLIN >=4 RESISTANT Resistant     TETRACYCLINE 2 SENSITIVE Sensitive     VANCOMYCIN 1 SENSITIVE Sensitive     TRIMETH/SULFA 80 RESISTANT Resistant     CLINDAMYCIN >=8 RESISTANT Resistant     RIFAMPIN <=0.5 SENSITIVE Sensitive     Inducible Clindamycin NEGATIVE Sensitive     * FEW STAPHYLOCOCCUS EPIDERMIDIS  MRSA Next Gen by PCR, Nasal     Status: None   Collection Time: 10/17/21 12:11 PM   Specimen: Nasal Mucosa; Nasal Swab  Result Value Ref Range Status   MRSA by PCR Next Gen NOT DETECTED NOT DETECTED Final    Comment: (NOTE) The GeneXpert MRSA Assay (FDA approved for NASAL specimens only), is one component of a comprehensive MRSA colonization surveillance program. It is not intended to diagnose MRSA infection nor to guide or monitor treatment for MRSA infections. Test performance is not FDA approved in patients less  than 26 years old. Performed at [facility] [facility], [address] [place], [place] [address]   Culture, blood (Routine X 2) w Reflex to ID Panel     Status: None   Collection Time: 10/17/21  2:33 PM   Specimen: BLOOD LEFT HAND  Result Value Ref Range Status   Specimen Description BLOOD LEFT HAND  Final   Special Requests   Final    BOTTLES DRAWN AEROBIC AND ANAEROBIC Blood Culture adequate volume   Culture   Final    NO GROWTH 5 DAYS Performed at [facility], [address], [place], [place] [address]    Report Status 10/22/2021 FINAL  Final  Culture, blood (Routine X 2) w Reflex to ID Panel     Status: None   Collection Time:  10/17/21  2:33 PM   Specimen: BLOOD RIGHT HAND  Result Value Ref Range Status   Specimen Description BLOOD RIGHT HAND  Final   Special Requests   Final    BOTTLES DRAWN AEROBIC AND ANAEROBIC Blood Culture adequate volume   Culture   Final    NO GROWTH 5 DAYS Performed at [facility], [address], [place],  [address]    Report Status 10/22/2021 FINAL  Final   Time coordinating discharge: 38 mins   SIGNED:  Irwin Brakeman, MD  [facility] 10/24/2021, 12:38 PM How to contact the [facility] Attending or Consulting provider [place] or covering provider during after hours [place], for this patient?  Check the care team in [facility] and look for a) attending/consulting [facility] provider listed and b) the [facility] team listed Log into [email or web address] and use [place]'s universal password to access. If you do not have the password, please contact the hospital operator. Locate the [facility] provider you are looking for under [facility] and page to a number that you can be directly reached. If you still have difficulty reaching the provider, please page the [facility] (Director on Call) for the Hospitalists listed on amion for assistance.

## 2021-10-24 NOTE — Discharge Instructions (Signed)
Please call hospice RN first for any concerns or problems, discomfort or distress.

## 2021-10-24 NOTE — TOC Transition Note (Signed)
Transition of Care [facility]) - CM/SW Discharge Note   Patient Details  Name: [name] MRN: [id number] Date of Birth: Sep 22, 1929  Transition of Care [facility]) CM/SW Contact:  Boneta Lucks, RN Phone Number: 10/24/2021, 9:56 AM   Clinical Narrative:   Patient wants to go home with Hospice, referral sent to [facility] yesterday. He has been accepted, equipment ordered and will be delivered this morning. Hospice will give an ETA for patient to be delivered. RN updated.   Final next level of care: [place] Barriers to Discharge: Barriers Resolved   Patient Goals and CMS Choice Patient states their goals for this hospitalization and ongoing recovery are:: agreeable to hospice. CMS Medicare.gov Compare Post Acute Care list provided to:: Patient Represenative (must comment) Choice offered to / list presented to : Adult Children  Discharge Placement         Name of family member notified: [name] Patient and family notified of of transfer: 10/24/21  Discharge Plan and Services In-house Referral: Clinical Social Work   Post Acute Care Choice: Hospice              Readmission Risk Interventions    10/24/2021    9:55 AM 10/20/2021    8:30 AM 10/03/2021   10:52 AM  Readmission Risk Prevention Plan  Transportation Screening  Complete   Medication Review (RN Care Manager)  Complete   PCP or Specialist appointment within 3-5 days of discharge Complete  Complete  HRI or Home Care Consult  Complete   SW Recovery Care/Counseling Consult  Complete   Palliative Care Screening  Not [place]  Complete

## 2021-10-27 ENCOUNTER — Encounter (HOSPITAL_COMMUNITY): Payer: Medicare Other

## 2021-10-27 ENCOUNTER — Other Ambulatory Visit (HOSPITAL_COMMUNITY): Payer: Medicare Other

## 2021-10-30 ENCOUNTER — Encounter (HOSPITAL_COMMUNITY): Payer: Medicare Other

## 2021-10-30 ENCOUNTER — Other Ambulatory Visit (HOSPITAL_COMMUNITY): Payer: Medicare Other

## 2021-11-03 ENCOUNTER — Telehealth: Payer: Self-pay | Admitting: Internal Medicine

## 2021-11-03 ENCOUNTER — Other Ambulatory Visit (HOSPITAL_COMMUNITY): Payer: Medicare Other

## 2021-11-03 ENCOUNTER — Encounter (HOSPITAL_COMMUNITY): Payer: Medicare Other

## 2021-11-05 NOTE — Telephone Encounter (Signed)
Routing to Dr. Wert as an FYI. 

## 2021-11-06 ENCOUNTER — Other Ambulatory Visit (HOSPITAL_COMMUNITY): Payer: Medicare Other

## 2021-11-06 ENCOUNTER — Encounter (HOSPITAL_COMMUNITY): Payer: Medicare Other

## 2021-11-12 ENCOUNTER — Encounter: Payer: Medicare Other | Admitting: General Surgery

## 2021-11-15 DEATH — deceased

## 2021-12-03 ENCOUNTER — Ambulatory Visit: Payer: Medicare Other | Admitting: Internal Medicine

## 2023-05-11 IMAGING — CT CT BIOPSY AND ASPIRATION BONE MARROW
1 of 3 series · 15 of 29 positions shown, 19 images · non-contrast
Comparison: none

INDICATION: MDS

[Series 2: i-spiral 5.0 br40 · axial · 0.79mm/px · z∈[-256,-155]mm · 15 of 33 slices shown, 19 images]
[im 2/33  mediastinal]
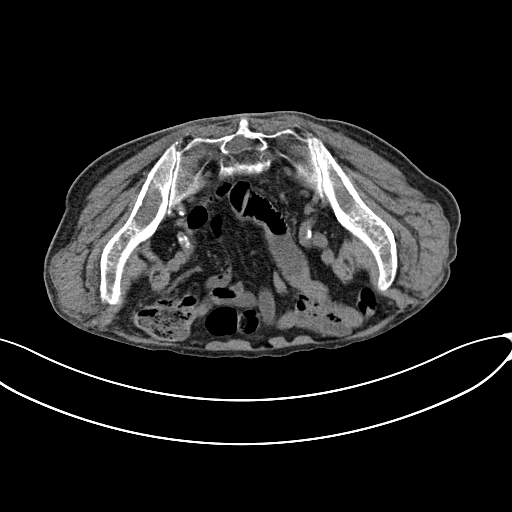
[im 2/33  lung]
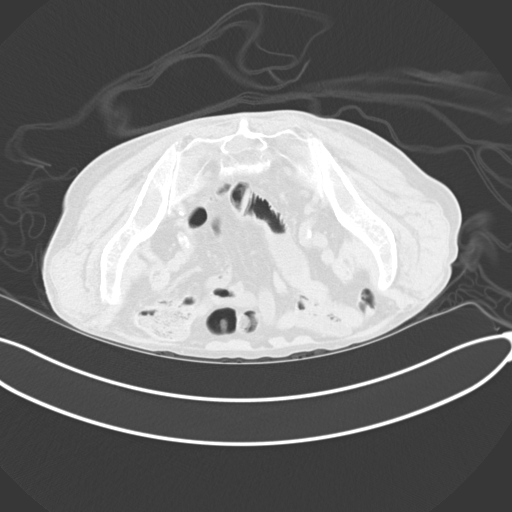
[im 4/33  lung]
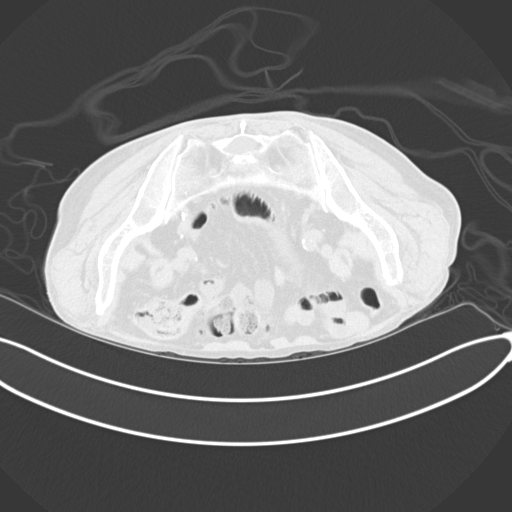
[im 7/33  lung]
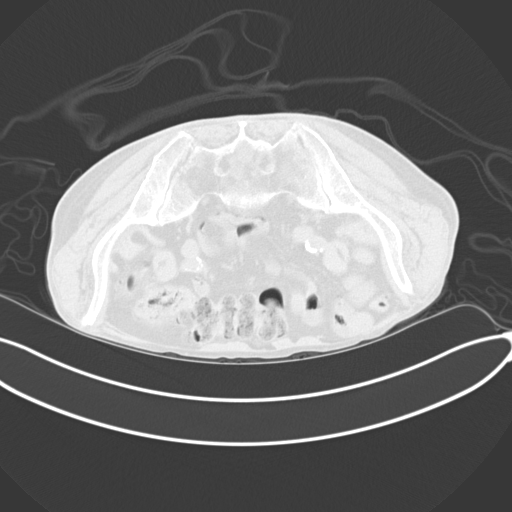
[im 9/33  lung]
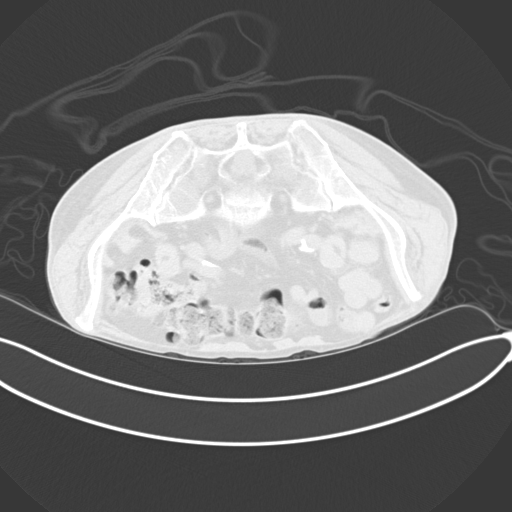
[im 11/33  mediastinal]
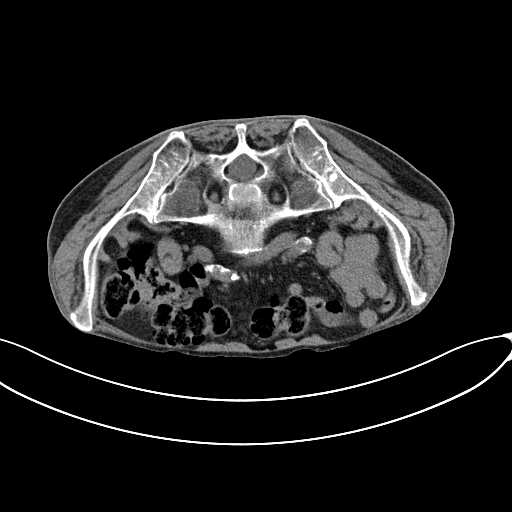
[im 11/33  lung]
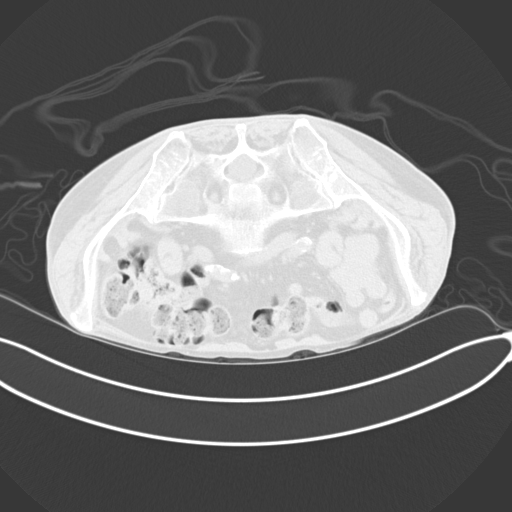
[im 12/33  lung]
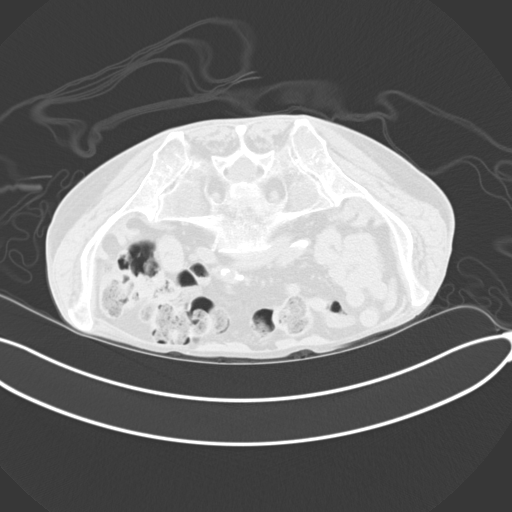
[im 15/33  lung]
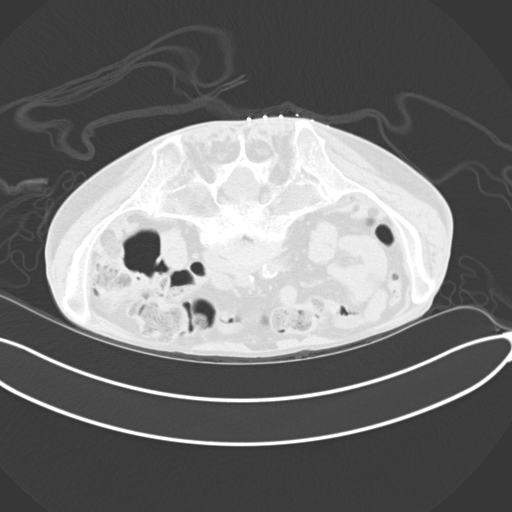
[im 16/33  lung]
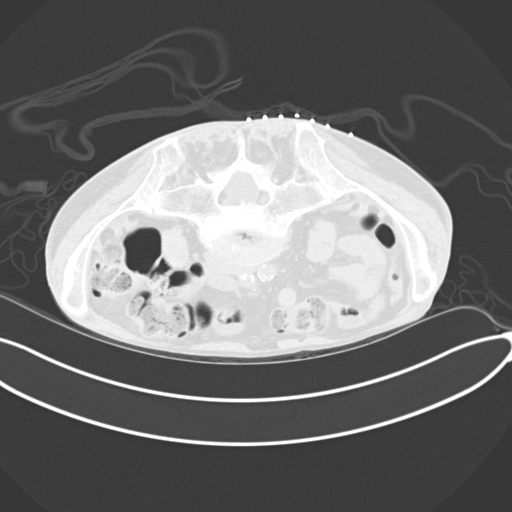
[im 17/33  mediastinal]
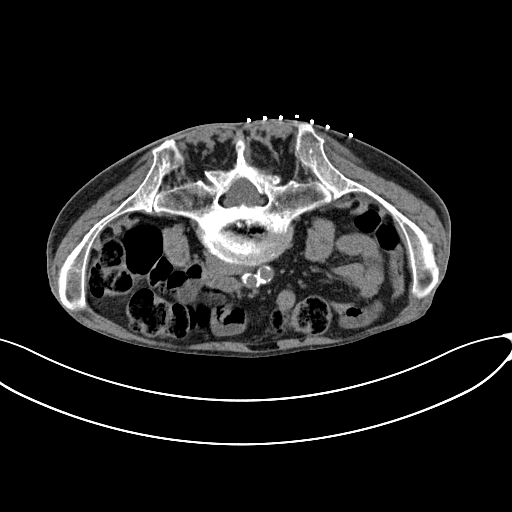
[im 17/33  lung]
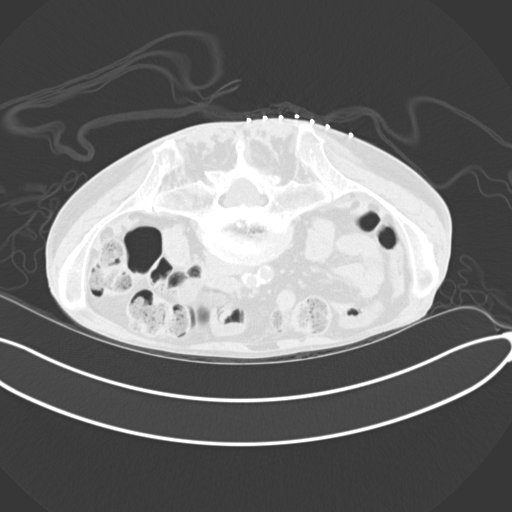
[im 21/33  lung]
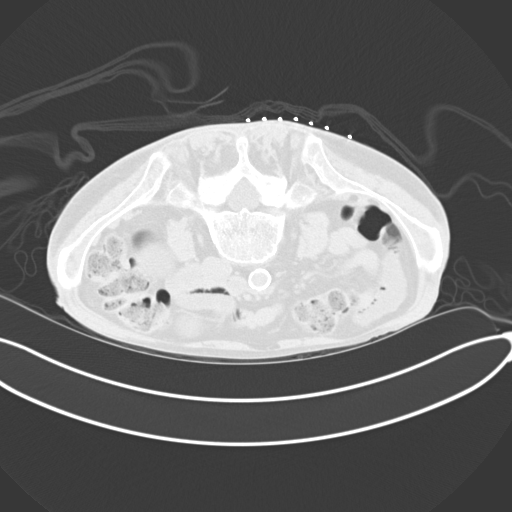
[im 22/33  lung]
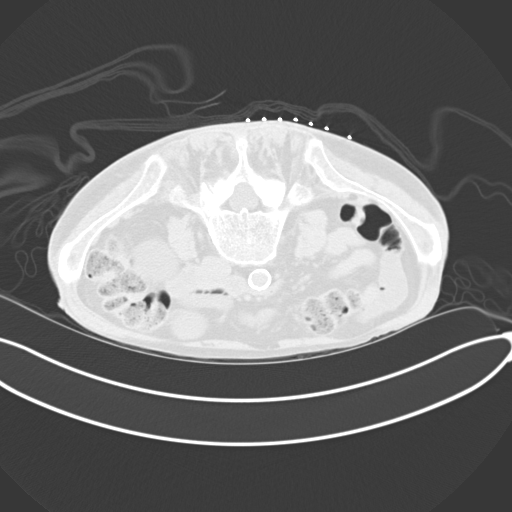
[im 24/33  lung]
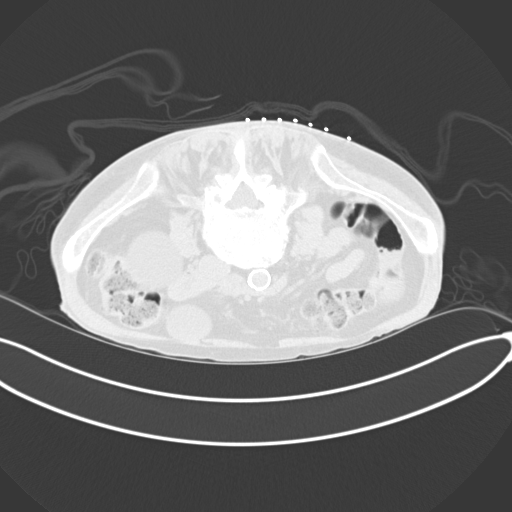
[im 26/33  mediastinal]
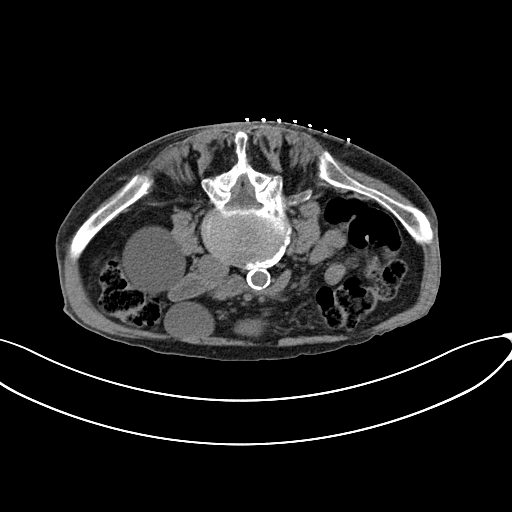
[im 26/33  lung]
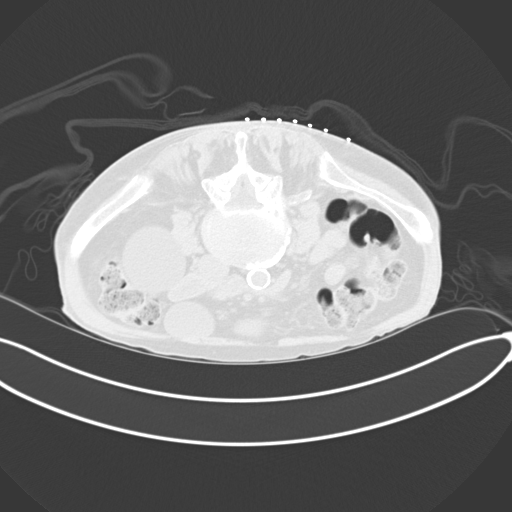
[im 29/33  lung]
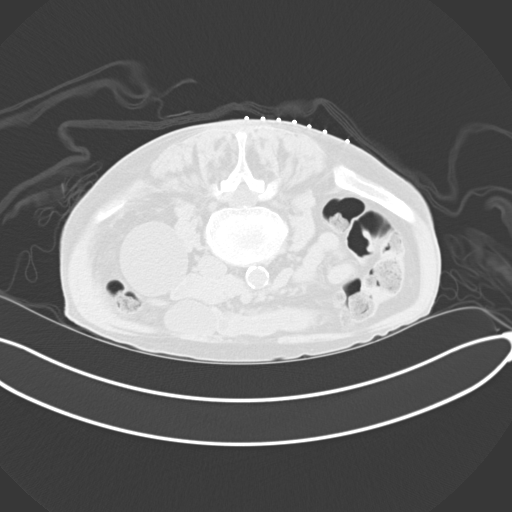
[im 31/33  lung]
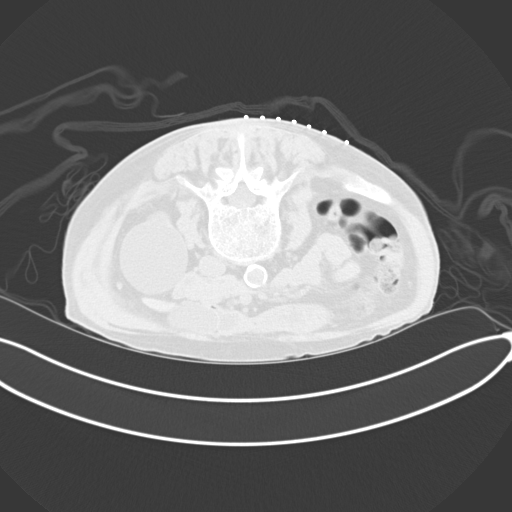

[15 of 29 positions shown; findings below may reference images not displayed]

EXAM:
CT BONE MARROW BIOPSY AND ASPIRATION; CT BIOPSY

MEDICATIONS:
None.

ANESTHESIA/SEDATION:
Moderate (conscious) sedation was employed during this procedure. A
total of Versed 0.5 mg and Fentanyl 25 mcg was administered
intravenously.

Moderate Sedation Time: 10 minutes. The patient's level of
consciousness and vital signs were monitored continuously by
radiology nursing throughout the procedure under my direct
supervision.

FLUOROSCOPY TIME:  N/a

COMPLICATIONS:
None immediate.

PROCEDURE:
Informed written consent was obtained from the patient after a
thorough discussion of the procedural risks, benefits and
alternatives. All questions were addressed. Maximal Sterile Barrier
Technique was utilized including caps, mask, sterile gowns, sterile
gloves, sterile drape, hand hygiene and skin antiseptic. A timeout
was performed prior to the initiation of the procedure.

The patient was placed prone on the CT exam table. Limited CT of the
pelvis was performed for planning purposes. Skin entry site was
marked, and the overlying skin was prepped and draped in the
standard sterile fashion. Local analgesia was obtained with 1%
lidocaine. Using CT guidance, an 11 gauge needle was advanced just
deep to the cortex of the right posterior ilium. Subsequently, bone
marrow aspiration and core biopsy were performed. Specimens were
submitted to lab/pathology for handling. Hemostasis was achieved
with manual pressure, and a clean dressing was placed. The patient
tolerated the procedure well without immediate complication.
IMPRESSION: Successful CT-guided bone marrow aspiration and core biopsy of the
right posterior ilium.

## 2023-05-11 IMAGING — CT CT BIOPSY
1 of 3 series · 15 of 29 positions shown, 19 images · non-contrast
Comparison: none

INDICATION: MDS

[Series 2: i-spiral 5.0 br40 · axial · 0.79mm/px · z∈[-256,-155]mm · 15 of 33 slices shown, 19 images]
[im 2/33  mediastinal]
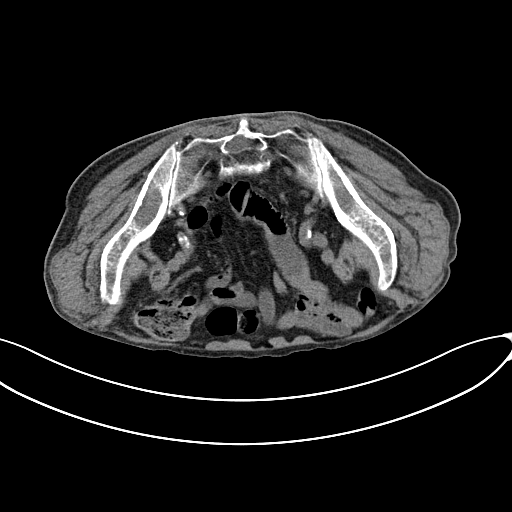
[im 2/33  lung]
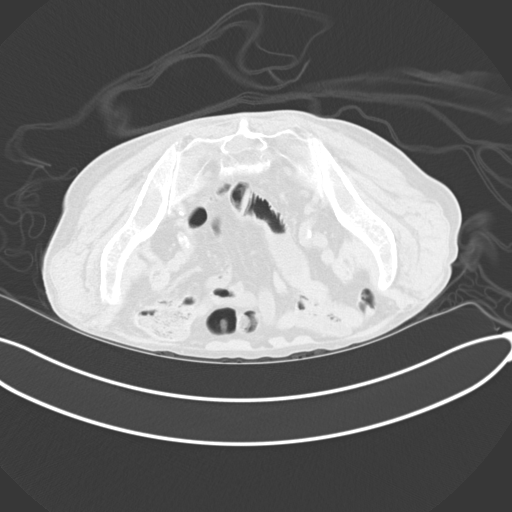
[im 4/33  lung]
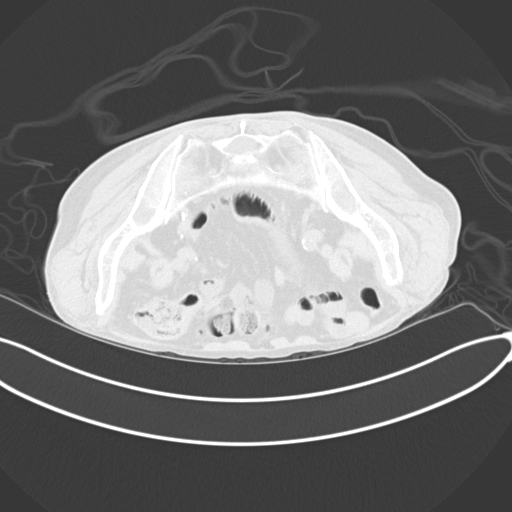
[im 7/33  lung]
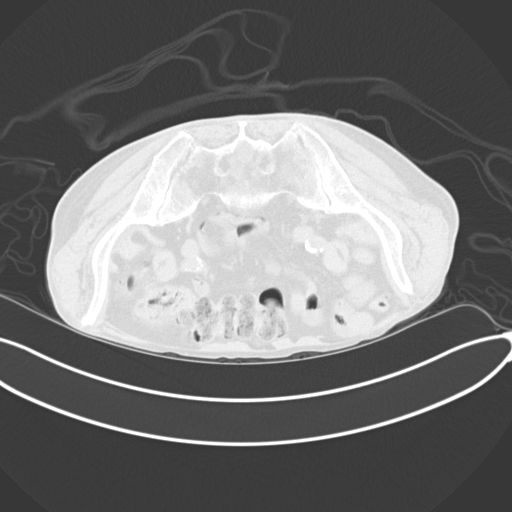
[im 9/33  lung]
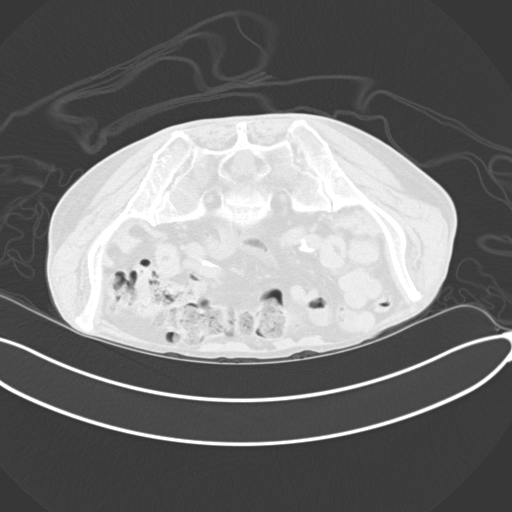
[im 11/33  mediastinal]
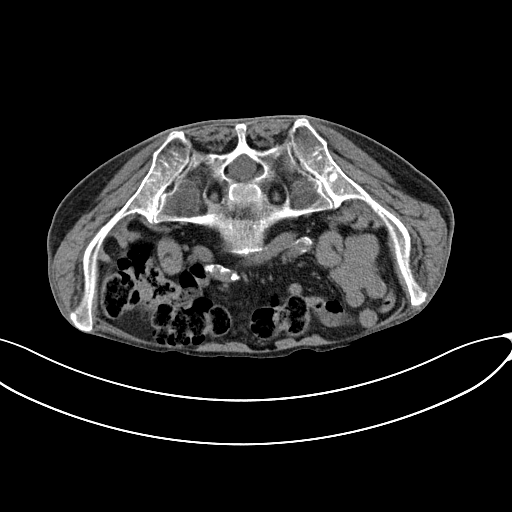
[im 11/33  lung]
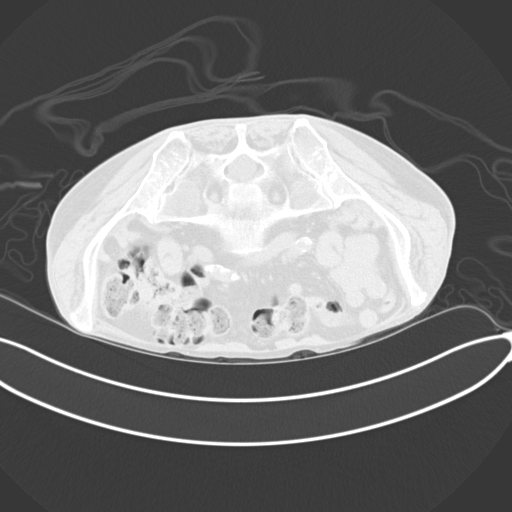
[im 12/33  lung]
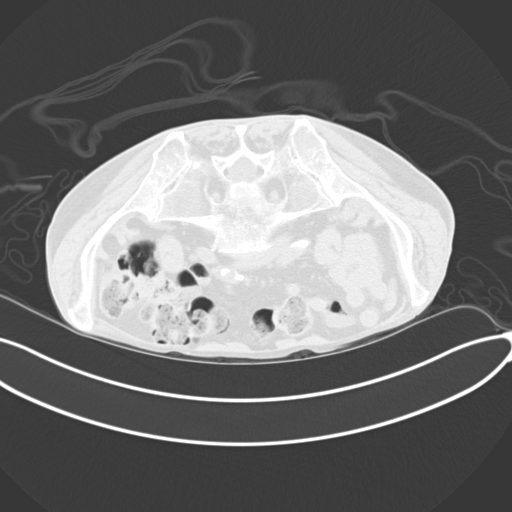
[im 15/33  lung]
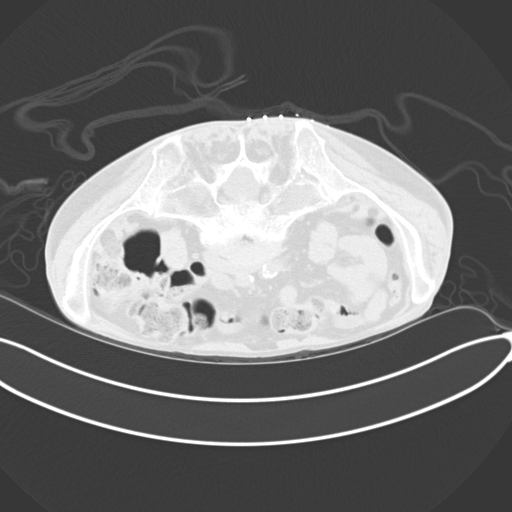
[im 16/33  lung]
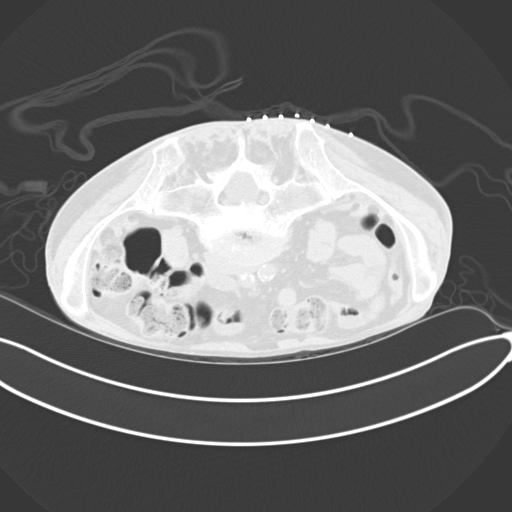
[im 17/33  mediastinal]
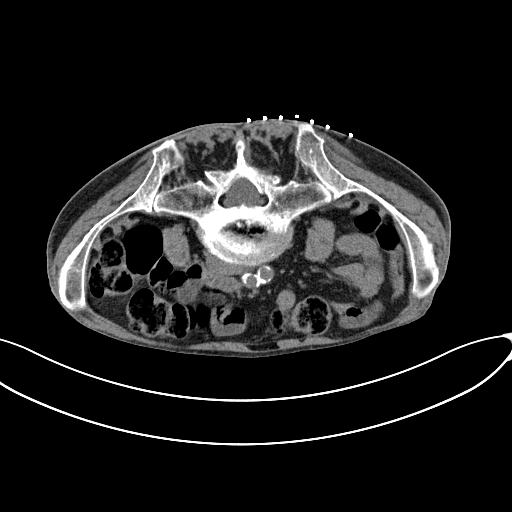
[im 17/33  lung]
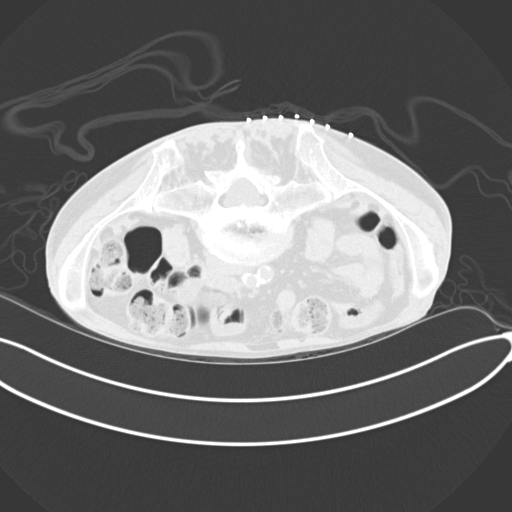
[im 21/33  lung]
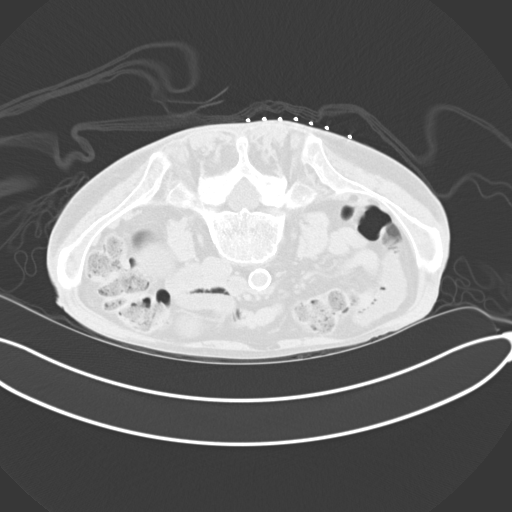
[im 22/33  lung]
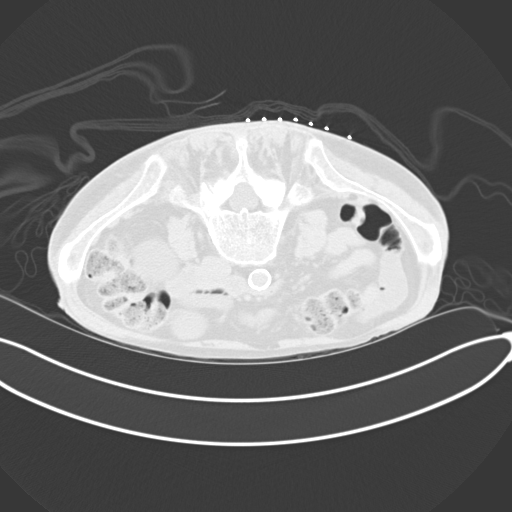
[im 24/33  lung]
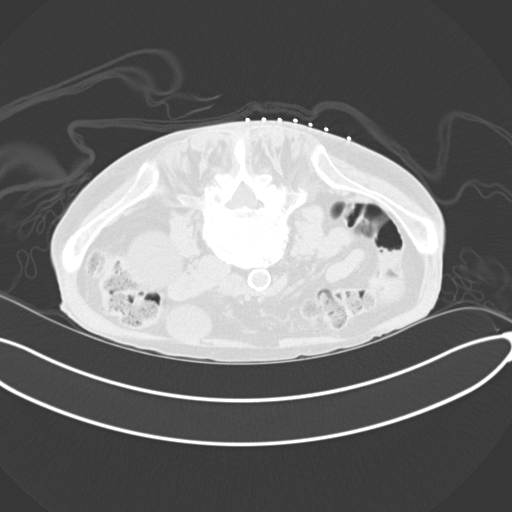
[im 26/33  mediastinal]
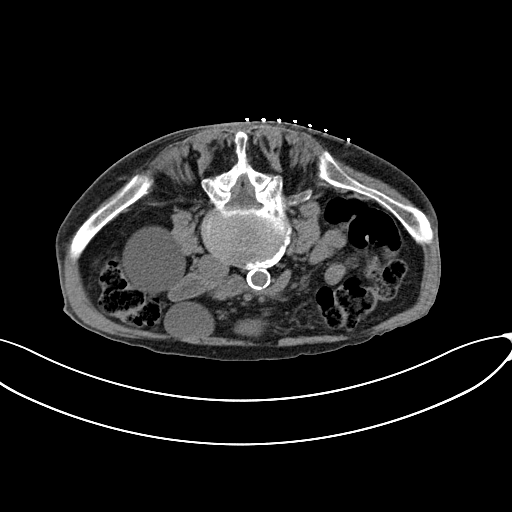
[im 26/33  lung]
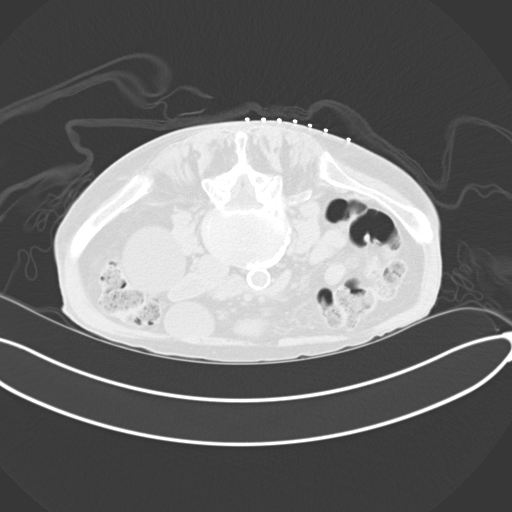
[im 29/33  lung]
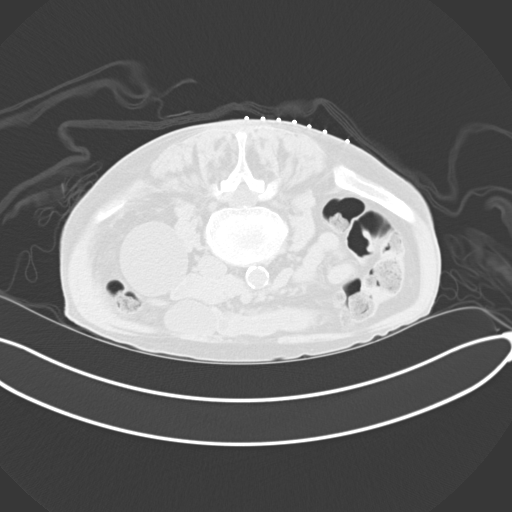
[im 31/33  lung]
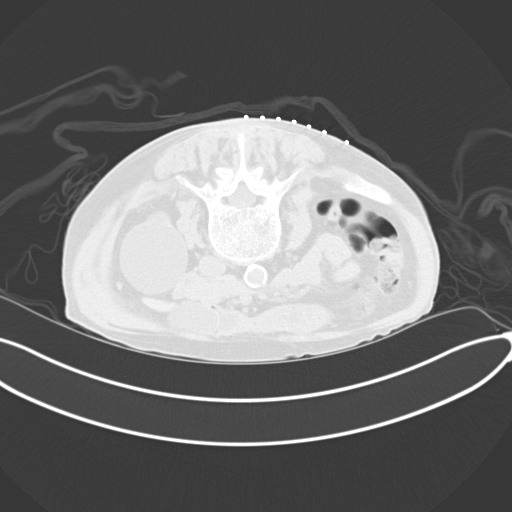

[15 of 29 positions shown; findings below may reference images not displayed]

EXAM:
CT BONE MARROW BIOPSY AND ASPIRATION; CT BIOPSY

MEDICATIONS:
None.

ANESTHESIA/SEDATION:
Moderate (conscious) sedation was employed during this procedure. A
total of Versed 0.5 mg and Fentanyl 25 mcg was administered
intravenously.

Moderate Sedation Time: 10 minutes. The patient's level of
consciousness and vital signs were monitored continuously by
radiology nursing throughout the procedure under my direct
supervision.

FLUOROSCOPY TIME:  N/a

COMPLICATIONS:
None immediate.

PROCEDURE:
Informed written consent was obtained from the patient after a
thorough discussion of the procedural risks, benefits and
alternatives. All questions were addressed. Maximal Sterile Barrier
Technique was utilized including caps, mask, sterile gowns, sterile
gloves, sterile drape, hand hygiene and skin antiseptic. A timeout
was performed prior to the initiation of the procedure.

The patient was placed prone on the CT exam table. Limited CT of the
pelvis was performed for planning purposes. Skin entry site was
marked, and the overlying skin was prepped and draped in the
standard sterile fashion. Local analgesia was obtained with 1%
lidocaine. Using CT guidance, an 11 gauge needle was advanced just
deep to the cortex of the right posterior ilium. Subsequently, bone
marrow aspiration and core biopsy were performed. Specimens were
submitted to lab/pathology for handling. Hemostasis was achieved
with manual pressure, and a clean dressing was placed. The patient
tolerated the procedure well without immediate complication.
IMPRESSION: Successful CT-guided bone marrow aspiration and core biopsy of the
right posterior ilium.
